# Patient Record
Sex: Male | Born: 1935
Health system: Southern US, Community
[De-identification: ages and names within clinical notes are randomized; demographics above are authoritative.]

## PROBLEM LIST (undated history)

## (undated) ENCOUNTER — Emergency Department (HOSPITAL_COMMUNITY): Admission: EM | Payer: Medicare Other | Source: Home / Self Care

## (undated) DIAGNOSIS — I4891 Unspecified atrial fibrillation: Secondary | ICD-10-CM

## (undated) DIAGNOSIS — I2510 Atherosclerotic heart disease of native coronary artery without angina pectoris: Secondary | ICD-10-CM

## (undated) DIAGNOSIS — K589 Irritable bowel syndrome without diarrhea: Secondary | ICD-10-CM

## (undated) DIAGNOSIS — I10 Essential (primary) hypertension: Secondary | ICD-10-CM

## (undated) DIAGNOSIS — R233 Spontaneous ecchymoses: Secondary | ICD-10-CM

## (undated) DIAGNOSIS — E119 Type 2 diabetes mellitus without complications: Secondary | ICD-10-CM

## (undated) DIAGNOSIS — M109 Gout, unspecified: Secondary | ICD-10-CM

## (undated) DIAGNOSIS — I498 Other specified cardiac arrhythmias: Secondary | ICD-10-CM

## (undated) DIAGNOSIS — K5790 Diverticulosis of intestine, part unspecified, without perforation or abscess without bleeding: Secondary | ICD-10-CM

## (undated) DIAGNOSIS — E785 Hyperlipidemia, unspecified: Secondary | ICD-10-CM

## (undated) DIAGNOSIS — I739 Peripheral vascular disease, unspecified: Secondary | ICD-10-CM

## (undated) DIAGNOSIS — H3530 Unspecified macular degeneration: Secondary | ICD-10-CM

## (undated) DIAGNOSIS — I499 Cardiac arrhythmia, unspecified: Secondary | ICD-10-CM

## (undated) DIAGNOSIS — D509 Iron deficiency anemia, unspecified: Secondary | ICD-10-CM

## (undated) DIAGNOSIS — H269 Unspecified cataract: Secondary | ICD-10-CM

## (undated) DIAGNOSIS — N183 Chronic kidney disease, stage 3 (moderate): Secondary | ICD-10-CM

## (undated) DIAGNOSIS — R001 Bradycardia, unspecified: Secondary | ICD-10-CM

## (undated) DIAGNOSIS — K469 Unspecified abdominal hernia without obstruction or gangrene: Secondary | ICD-10-CM

## (undated) DIAGNOSIS — C4491 Basal cell carcinoma of skin, unspecified: Secondary | ICD-10-CM

## (undated) DIAGNOSIS — R238 Other skin changes: Secondary | ICD-10-CM

## (undated) DIAGNOSIS — K52839 Microscopic colitis, unspecified: Secondary | ICD-10-CM

## (undated) DIAGNOSIS — Z8489 Family history of other specified conditions: Secondary | ICD-10-CM

## (undated) DIAGNOSIS — D631 Anemia in chronic kidney disease: Secondary | ICD-10-CM

## (undated) HISTORY — DX: Irritable bowel syndrome, unspecified: K58.9

## (undated) HISTORY — DX: Anemia in chronic kidney disease: D63.1

## (undated) HISTORY — DX: Basal cell carcinoma of skin, unspecified: C44.91

## (undated) HISTORY — DX: Unspecified macular degeneration: H35.30

## (undated) HISTORY — DX: Gout, unspecified: M10.9

## (undated) HISTORY — DX: Iron deficiency anemia, unspecified: D50.9

## (undated) HISTORY — DX: Diverticulosis of intestine, part unspecified, without perforation or abscess without bleeding: K57.90

## (undated) HISTORY — DX: Chronic kidney disease, stage 3 (moderate): N18.3

## (undated) HISTORY — DX: Essential (primary) hypertension: I10

## (undated) HISTORY — DX: Hyperlipidemia, unspecified: E78.5

## (undated) HISTORY — DX: Microscopic colitis, unspecified: K52.839

## (undated) HISTORY — DX: Atherosclerotic heart disease of native coronary artery without angina pectoris: I25.10

## (undated) HISTORY — PX: EYE SURGERY: SHX253

## (undated) HISTORY — DX: Other specified cardiac arrhythmias: I49.8

## (undated) HISTORY — DX: Peripheral vascular disease, unspecified: I73.9

## (undated) HISTORY — DX: Unspecified cataract: H26.9

## (undated) HISTORY — PX: CAROTID ENDARTERECTOMY: SUR193

## (undated) HISTORY — PX: SKIN CANCER EXCISION: SHX779

## (undated) HISTORY — DX: Cardiac arrhythmia, unspecified: I49.9

---
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
## 1997-04-28 HISTORY — PX: CORONARY ARTERY BYPASS GRAFT: SHX141

## 2000-04-23 ENCOUNTER — Encounter: Payer: Self-pay | Admitting: Emergency Medicine

## 2000-04-24 ENCOUNTER — Inpatient Hospital Stay (HOSPITAL_COMMUNITY): Admission: EM | Admit: 2000-04-24 | Discharge: 2000-04-25 | Payer: Self-pay | Admitting: Emergency Medicine

## 2000-04-25 ENCOUNTER — Encounter: Payer: Self-pay | Admitting: Cardiovascular Disease

## 2001-06-22 ENCOUNTER — Ambulatory Visit (HOSPITAL_COMMUNITY): Admission: RE | Admit: 2001-06-22 | Discharge: 2001-06-22 | Payer: Self-pay | Admitting: Gastroenterology

## 2003-12-01 ENCOUNTER — Emergency Department (HOSPITAL_COMMUNITY): Admission: EM | Admit: 2003-12-01 | Discharge: 2003-12-02 | Payer: Self-pay | Admitting: Emergency Medicine

## 2004-04-28 HISTORY — PX: CARDIAC CATHETERIZATION: SHX172

## 2004-12-24 ENCOUNTER — Ambulatory Visit: Payer: Self-pay | Admitting: Cardiovascular Disease

## 2004-12-26 ENCOUNTER — Inpatient Hospital Stay (HOSPITAL_COMMUNITY): Admission: EM | Admit: 2004-12-26 | Discharge: 2004-12-28 | Payer: Self-pay | Admitting: Emergency Medicine

## 2004-12-27 ENCOUNTER — Encounter: Payer: Self-pay | Admitting: Cardiology

## 2004-12-29 ENCOUNTER — Ambulatory Visit: Payer: Self-pay | Admitting: Cardiology

## 2005-01-03 ENCOUNTER — Ambulatory Visit: Payer: Self-pay | Admitting: Internal Medicine

## 2005-01-10 ENCOUNTER — Ambulatory Visit: Payer: Self-pay | Admitting: Cardiovascular Disease

## 2005-01-15 ENCOUNTER — Ambulatory Visit: Payer: Self-pay

## 2005-03-17 HISTORY — PX: COLONOSCOPY: SHX174

## 2005-03-17 HISTORY — PX: ESOPHAGOGASTRODUODENOSCOPY: SHX1529

## 2005-07-08 ENCOUNTER — Ambulatory Visit: Payer: Self-pay | Admitting: Cardiovascular Disease

## 2006-01-15 ENCOUNTER — Ambulatory Visit: Payer: Self-pay

## 2006-07-10 ENCOUNTER — Ambulatory Visit: Payer: Self-pay

## 2006-07-10 ENCOUNTER — Ambulatory Visit: Payer: Self-pay | Admitting: Cardiovascular Disease

## 2007-01-08 ENCOUNTER — Ambulatory Visit: Payer: Self-pay

## 2007-07-02 ENCOUNTER — Ambulatory Visit: Payer: Self-pay | Admitting: Cardiovascular Disease

## 2007-07-02 ENCOUNTER — Ambulatory Visit: Payer: Self-pay

## 2007-12-30 ENCOUNTER — Ambulatory Visit: Payer: Self-pay

## 2007-12-30 ENCOUNTER — Ambulatory Visit: Payer: Self-pay | Admitting: Cardiovascular Disease

## 2008-01-04 ENCOUNTER — Ambulatory Visit: Payer: Self-pay | Admitting: Vascular Surgery

## 2008-01-13 ENCOUNTER — Encounter: Payer: Self-pay | Admitting: Vascular Surgery

## 2008-01-13 ENCOUNTER — Inpatient Hospital Stay (HOSPITAL_COMMUNITY): Admission: RE | Admit: 2008-01-13 | Discharge: 2008-01-14 | Payer: Self-pay | Admitting: Vascular Surgery

## 2008-01-13 ENCOUNTER — Ambulatory Visit: Payer: Self-pay | Admitting: Vascular Surgery

## 2008-02-01 ENCOUNTER — Ambulatory Visit: Payer: Self-pay | Admitting: Vascular Surgery

## 2008-03-24 ENCOUNTER — Ambulatory Visit: Payer: Self-pay

## 2008-07-06 ENCOUNTER — Ambulatory Visit: Payer: Self-pay | Admitting: Cardiovascular Disease

## 2008-07-06 ENCOUNTER — Ambulatory Visit: Payer: Self-pay

## 2008-08-02 DIAGNOSIS — E7800 Pure hypercholesterolemia, unspecified: Secondary | ICD-10-CM | POA: Insufficient documentation

## 2008-08-02 DIAGNOSIS — I739 Peripheral vascular disease, unspecified: Secondary | ICD-10-CM | POA: Insufficient documentation

## 2008-08-02 DIAGNOSIS — R7303 Prediabetes: Secondary | ICD-10-CM | POA: Insufficient documentation

## 2008-08-02 DIAGNOSIS — I10 Essential (primary) hypertension: Secondary | ICD-10-CM | POA: Insufficient documentation

## 2008-08-02 DIAGNOSIS — I6529 Occlusion and stenosis of unspecified carotid artery: Secondary | ICD-10-CM | POA: Insufficient documentation

## 2008-08-02 DIAGNOSIS — I498 Other specified cardiac arrhythmias: Secondary | ICD-10-CM | POA: Insufficient documentation

## 2008-08-02 DIAGNOSIS — Z8719 Personal history of other diseases of the digestive system: Secondary | ICD-10-CM | POA: Insufficient documentation

## 2008-08-02 DIAGNOSIS — I25810 Atherosclerosis of coronary artery bypass graft(s) without angina pectoris: Secondary | ICD-10-CM | POA: Insufficient documentation

## 2008-08-03 ENCOUNTER — Encounter: Payer: Self-pay | Admitting: Cardiovascular Disease

## 2008-08-03 ENCOUNTER — Ambulatory Visit: Payer: Self-pay | Admitting: Cardiovascular Disease

## 2008-08-03 DIAGNOSIS — I70219 Atherosclerosis of native arteries of extremities with intermittent claudication, unspecified extremity: Secondary | ICD-10-CM | POA: Insufficient documentation

## 2008-10-21 ENCOUNTER — Ambulatory Visit: Payer: Self-pay | Admitting: Diagnostic Radiology

## 2008-10-21 ENCOUNTER — Emergency Department (HOSPITAL_BASED_OUTPATIENT_CLINIC_OR_DEPARTMENT_OTHER): Admission: EM | Admit: 2008-10-21 | Discharge: 2008-10-22 | Payer: Self-pay | Admitting: Emergency Medicine

## 2008-10-23 ENCOUNTER — Telehealth: Payer: Self-pay | Admitting: Cardiovascular Disease

## 2008-10-24 ENCOUNTER — Ambulatory Visit: Payer: Self-pay | Admitting: Cardiology

## 2008-10-24 ENCOUNTER — Inpatient Hospital Stay (HOSPITAL_COMMUNITY): Admission: EM | Admit: 2008-10-24 | Discharge: 2008-10-27 | Payer: Self-pay | Admitting: Emergency Medicine

## 2008-11-29 ENCOUNTER — Telehealth: Payer: Self-pay | Admitting: Cardiovascular Disease

## 2009-01-09 ENCOUNTER — Ambulatory Visit: Payer: Self-pay | Admitting: Cardiovascular Disease

## 2009-02-01 ENCOUNTER — Encounter (INDEPENDENT_AMBULATORY_CARE_PROVIDER_SITE_OTHER): Payer: Self-pay | Admitting: *Deleted

## 2009-06-26 ENCOUNTER — Ambulatory Visit: Payer: Self-pay

## 2009-06-26 ENCOUNTER — Ambulatory Visit: Payer: Self-pay | Admitting: Cardiovascular Disease

## 2009-07-02 ENCOUNTER — Ambulatory Visit: Payer: Self-pay

## 2009-07-02 ENCOUNTER — Ambulatory Visit: Payer: Self-pay | Admitting: Cardiovascular Disease

## 2010-05-28 NOTE — Assessment & Plan Note (Signed)
Summary: F6M/NEEDS CAROTID SAMEDAY/DM   Referring Provider:  Dr Johnsie Cancel   History of Present Illness: [name] is seen today post hospital D/C He had recurrent SSCP with history of CABG.  All of his grafts were patent and his pain has not recurred.  His primary has added norvasc for BP control and stopped his HCTZ/  I reviewed his home readings and they have been ok.   He needs to be on HCTZ though or he gets swelling in his ankles His leg has healed well.  He is walking without symptoms He sees Dr Burt Knack for PVD with mild decrease in his ABI"s on the right which should not be significant.  I reviewed his carotids today and they were normal by duplex.    Current Problems (verified): 1)  Atherosclerosis W /int Claudication  (ICD-440.21) 2)  Diabetes Mellitus, Borderline  (ICD-790.29) 3)  Bigeminy  (ICD-427.89) 4)  Diverticulitis, Hx of  (ICD-V12.79) 5)  Hypercholesterolemia Iia  (ICD-272.0) 6)  Carotid Artery Disease  (ICD-433.10) 7)  Pvd  (ICD-443.9) 8)  Hypertension  (ICD-401.9) 9)  Cad, Artery Bypass Graft  (ICD-414.04)  Current Medications (verified): 1)  Carvedilol 12.5 Mg Tabs (Carvedilol) .... Take One Tablet By Mouth Twice A Day 2)  Amlodipine Besylate 5 Mg Tabs (Amlodipine Besylate) .Marland Kitchen.. 1 Tab By Mouth Once A Day 3)  Lisinopril-Hydrochlorothiazide 20-12.5 Mg Tabs (Lisinopril-Hydrochlorothiazide) .... Take 1 Tablet By Mouth Once A Day 4)  Lipitor 20 Mg Tabs (Atorvastatin Calcium) .... Take One Tablet By Mouth Daily. 5)  Aspirin 81 Mg Tbec (Aspirin) .... Take One Tablet By Mouth Daily 6)  Preservision Areds  Caps (Multiple Vitamins-Minerals) .... Take 1 Capsule By Mouth Two Times A Day 7)  Zinc 50 Mg Tabs (Zinc) .... Take 1 Tablet By Mouth Once A Day 8)  Fish Oil   Oil (Fish Oil) .... 3 Capsules Daily 9)  Cinnamon 500 Mg Caps (Cinnamon) .... Take 1.000mg  2 Capsules Two Times A Day 10)  Chromium Gtf 200 Mcg Tabs (Chromium) .... 300mg  Tablet Two Times A Day 11)  Nitroglycerin 0.4 Mg  Subl (Nitroglycerin) .... Place 1 Tablet Under Tongue As Directed 12)  Multivitamins  Tabs (Multiple Vitamin) .... Take 1 Tablet By Mouth Once A Day 13)  Sugar Formula .Marland Kitchen.. 2-3- Times A Day  Allergies (verified): 1)  ! Penicillin  Past History:  Past Medical History: Last updated: 08/02/2008 CAD post bypass Hypertension PVD Cerebrovascular Disease Hyperlipidemia Diverticulitis Borderline Diabetes Bigeminal rhythm  Past Surgical History: Last updated: 08/02/2008 CABG- history of multivessel coronary artery disease, status  post coronary artery bypass grafting by Dr. Roxan Hockey in 1999, including a  LIMA to the left anterior descending artery, saphenous vein graft to the  diagonal, saphenous vein graft to the ramus intermedius, first and second  obtuse marginal branches, and saphenous vein graft to the posterior  descending and posterolateral branches of the right coronary artery with  preserved ejection fraction.   Left carotid endarterectomy with  Dacron patch angioplasty  Family History: Last updated: 08/03/2008  No history of peripheral arterial disease, DVT, or aneurysm. Strong family history of CAD.  Social History: Last updated: 08/02/2008   Patient is married with one son.    There is no significant ongoing tobacco or alcohol use history  Review of Systems       Denies fever, malais, weight loss, blurry vision, decreased visual acuity, cough, sputum, SOB, hemoptysis, pleuritic pain, palpitaitons, heartburn, abdominal pain, melena, lower extremity edema, claudication, or rash. All other systems  reviewed and negative  Vital Signs:  Patient profile:   77 year old male Height:      68 inches Weight:      175 pounds BMI:     26.70 Pulse rate:   67 / minute Resp:     14 per minute BP sitting:   146 / 69  (left arm)  Vitals Entered By: Burnett Kanaris (July 02, 2009 10:00 AM)  Physical Exam  General:  Affect appropriate Healthy:  appears stated  age 25: normal Neck supple with no adenopathy JVP normal Right  bruits no thyromegaly Lungs clear with no wheezing and good diaphragmatic motion Heart:  S1/S2 no murmur,rub, gallop or click PMI normal Abdomen: benighn, BS positve, no tenderness, no AAA no bruit.  No HSM or HJR Distal pulses intact with no bruits No edema Neuro non-focal Skin warm and dry S/P bilateral CEA   Impression & Recommendations:  Problem # 1:  ATHEROSCLEROSIS W /INT CLAUDICATION (ICD-440.21) Mild disease on right pain primarily muscular.  No critical PVD  Problem # 2:  HYPERCHOLESTEROLEMIA  IIA (ICD-272.0) Continue statin labs per primary His updated medication list for this problem includes:    Lipitor 20 Mg Tabs (Atorvastatin calcium) .Marland Kitchen... Take one tablet by mouth daily.  Problem # 3:  CAROTID ARTERY DISEASE (ICD-433.10) Patent bilateral CEA's F/U duplex in 2 years His updated medication list for this problem includes:    Aspirin 81 Mg Tbec (Aspirin) .Marland Kitchen... Take one tablet by mouth daily  Problem # 4:  CAD, ARTERY BYPASS GRAFT (ICD-414.04) Stable no angina patent grafts by cath  His updated medication list for this problem includes:    Carvedilol 12.5 Mg Tabs (Carvedilol) .Marland Kitchen... Take one tablet by mouth twice a day    Amlodipine Besylate 5 Mg Tabs (Amlodipine besylate) .Marland Kitchen... 1 tab by mouth once a day    Lisinopril-hydrochlorothiazide 20-12.5 Mg Tabs (Lisinopril-hydrochlorothiazide) .Marland Kitchen... Take 1 tablet by mouth once a day    Aspirin 81 Mg Tbec (Aspirin) .Marland Kitchen... Take one tablet by mouth daily    Nitroglycerin 0.4 Mg Subl (Nitroglycerin) .Marland Kitchen... Place 1 tablet under tongue as directed   EKG Report  Procedure date:  07/02/2009  Findings:      NSR PR 212 Otherwise normal

## 2010-05-28 NOTE — Miscellaneous (Signed)
Summary: Orders Update  Clinical Lists Changes  Orders: Added new Test order of Carotid Duplex (Carotid Duplex) - Signed 

## 2010-05-28 NOTE — Assessment & Plan Note (Signed)
Summary: ROV    Visit Type:  Follow-up Referring Provider:  Dr Johnsie Cancel  CC:  Constant left leg calf pain.  History of Present Illness: 77 year-old male with lower extremity PAD and probable left SFA occlusion who has been managed medically because of mild symptoms. He has had stable claudication for greater than one year with walking over .5 miles, but about one month ago felt like he 'pulled a muscle' in his left calf with walking and has had near constant left calf pain since that time. Difficult to walk secondary to pain. Denies pain in the foot. Denies ulceration, numness, or tingling. No right leg symptoms.  Current Medications (verified): 1)  Carvedilol 12.5 Mg Tabs (Carvedilol) .... Take One Tablet By Mouth Twice A Day 2)  Amlodipine Besylate 5 Mg Tabs (Amlodipine Besylate) .Marland Kitchen.. 1 Tab By Mouth Once A Day 3)  Lisinopril-Hydrochlorothiazide 20-12.5 Mg Tabs (Lisinopril-Hydrochlorothiazide) .... Take 1 Tablet By Mouth Once A Day 4)  Lipitor 20 Mg Tabs (Atorvastatin Calcium) .... Take One Tablet By Mouth Daily. 5)  Aspirin 81 Mg Tbec (Aspirin) .... Take One Tablet By Mouth Daily 6)  Preservision Areds  Caps (Multiple Vitamins-Minerals) .... Take 1 Capsule By Mouth Two Times A Day 7)  Zinc 50 Mg Tabs (Zinc) .... Take 1 Tablet By Mouth Once A Day 8)  Fish Oil   Oil (Fish Oil) .... 3 Capsules Daily 9)  Cinnamon 500 Mg Caps (Cinnamon) .... Take 1.000mg  2 Capsules Two Times A Day 10)  Chromium Gtf 200 Mcg Tabs (Chromium) .... 300mg  Tablet Two Times A Day 11)  Nitroglycerin 0.4 Mg Subl (Nitroglycerin) .... Place 1 Tablet Under Tongue As Directed 12)  Multivitamins  Tabs (Multiple Vitamin) .... Take 1 Tablet By Mouth Once A Day 13)  Sugar Formula .Marland Kitchen.. 2-3- Times A Day  Allergies: 1)  ! Penicillin  Past History:  Past medical history reviewed for relevance to current acute and chronic problems.  Past Medical History: CAD post bypass Hypertension PVD with right ABI 1.1 and left ABI 0.8,  intermittent claudication. Cerebrovascular Disease Hyperlipidemia Diverticulitis Borderline Diabetes Bigeminal rhythm  Review of Systems       Negative except as per HPI   Vital Signs:  Patient profile:   77 year old male Height:      68 inches Weight:      174.75 pounds BMI:     26.67 Pulse rate:   73 / minute Pulse rhythm:   regular Resp:     18 per minute BP sitting:   146 / 73  (left arm) Cuff size:   large  Vitals Entered By: Sidney Ace (June 26, 2009 2:34 PM)  Serial Vital Signs/Assessments:  Time      Position  BP       Pulse  Resp  Temp     By           R Arm     138/79                         Sidney Ace   Physical Exam  General:  Pt is alert and oriented, in no acute distress. HEENT: normal Neck: normal carotid upstrokes without bruits, JVP normal Lungs: CTA CV: RRR without murmur or gallop Abd: soft, NT, positive BS, no bruit, no organomegaly Ext: no clubbing, cyanosis, or edema. peripheral pulses 2+ on the right, left femoral 2+, DP and PT 1+ Skin: warm and dry without rash  ABI's  Procedure date:  06/26/2009  Findings:      Right: 1.1, Left: 0.81  Impression & Recommendations:  Problem # 1:  ATHEROSCLEROSIS W /INT CLAUDICATION (ICD-440.21) I suspect his leg pain is secondary to a muscle strain as his history suggests this. There is no exam evidence of progressive occlusive disease or resting ischemia. ABI's were done to assess for any change from baseline and they were stable with mild disease on the left at 0.8. Reviewed stretching exercises with pt. Continue current medical therapy.  Orders: Arterial Duplex Lower Extremity (Arterial Duplex Low)  Patient Instructions: 1)  Your physician wants you to follow-up in:   1 YEAR. You will receive a reminder letter in the mail two months in advance. If you don't receive a letter, please call our office to schedule the follow-up appointment. 2)  Your physician has requested that you have an  ankle brachial index (ABI) today. During this test an ultrasound and blood pressure cuff are used to evaluate the arteries that supply the arms and legs with blood. Allow thirty minutes for this exam. There are no restrictions or special instructions. 3)  Your physician recommends that you continue on your current medications as directed. Please refer to the Current Medication list given to you today.

## 2010-06-28 ENCOUNTER — Encounter: Payer: Self-pay | Admitting: Cardiovascular Disease

## 2010-06-28 ENCOUNTER — Ambulatory Visit (INDEPENDENT_AMBULATORY_CARE_PROVIDER_SITE_OTHER): Payer: Medicare Other | Admitting: Cardiovascular Disease

## 2010-06-28 DIAGNOSIS — I70219 Atherosclerosis of native arteries of extremities with intermittent claudication, unspecified extremity: Secondary | ICD-10-CM

## 2010-07-04 NOTE — Assessment & Plan Note (Signed)
Summary: F1Y   Visit Type:  1 year follow up Referring Provider:  Dr Johnsie Cancel Primary Provider:  Dr Maceo Pro  CC:  No complaints.  History of Present Illness: 77 year-old presents for followup of lower extremity PAD. He has also undergone carotid endarterectomy with widely patent carotids at his last duplex scan one year ago.   He is doing well. Reports stable, mild left calf claudication at 1/2 mile of walking. This resolves immediately with rest. No rest pain, ulceration, chest pain, dyspnea, or other complaints.  Current Medications (verified): 1)  Carvedilol 12.5 Mg Tabs (Carvedilol) .... Take One Tablet By Mouth Twice A Day 2)  Amlodipine Besylate 5 Mg Tabs (Amlodipine Besylate) .... 2 Tablets Once A Day 3)  Lisinopril 20 Mg Tabs (Lisinopril) .... Take One Tablet By Mouth Daily 4)  Livalo 4 Mg Tabs (Pitavastatin Calcium) .... Take 1 Tablet By Mouth Once A Day 5)  Aspirin 81 Mg Tbec (Aspirin) .... Take One Tablet By Mouth Daily 6)  Preservision Areds  Caps (Multiple Vitamins-Minerals) .... Take 1 Capsule By Mouth Two Times A Day 7)  Zinc 50 Mg Tabs (Zinc) .... Take 1 Tablet By Mouth Once A Day 8)  Fish Oil   Oil (Fish Oil) .... Take 1 Capsule By Mouth Two Times A Day 9)  Cinnamon 500 Mg Caps (Cinnamon) .... Take 1.000mg  2 Capsules Two Times A Day 10)  Nitroglycerin 0.4 Mg Subl (Nitroglycerin) .... Place 1 Tablet Under Tongue As Directed 11)  Multivitamins  Tabs (Multiple Vitamin) .... Take 1 Tablet By Mouth Once A Day 12)  Sugar Formula .Marland Kitchen.. 2-3- Times A Day 13)  Prednisone 2.5 Mg Tabs (Prednisone) .... Every Other Day  Allergies: 1)  ! Penicillin  Past History:  Past Medical History: Last updated: 06/26/2009 CAD post bypass Hypertension PVD with right ABI 1.1 and left ABI 0.8, intermittent claudication. Cerebrovascular Disease Hyperlipidemia Diverticulitis Borderline Diabetes Bigeminal rhythm  Vital Signs:  Patient profile:   77 year old male Height:      68  inches Weight:      169.50 pounds BMI:     25.87 Pulse rate:   60 / minute Pulse rhythm:   regular Resp:     18 per minute BP sitting:   118 / 54  (left arm) Cuff size:   large  Vitals Entered By: Sidney Ace (June 28, 2010 9:51 AM)  Serial Vital Signs/Assessments:  Time      Position  BP       Pulse  Resp  Temp     By           R Arm     118/54                         Sidney Ace   Physical Exam  General:  Pt is alert and oriented, in no acute distress. HEENT: normal Neck: normal carotid upstrokes without bruits, JVP normal Lungs: CTA CV: RRR without murmur or gallop Abd: soft, NT, positive BS, no bruit, no organomegaly Ext:fem pulses 2+, right DP and PT 2+, left DP 1+, PT nonpalp. Both feet warm. Skin: warm and dry without rash    Impression & Recommendations:  Problem # 1:  ATHEROSCLEROSIS W /INT CLAUDICATION (ICD-440.21) Pt has stable, mild intermittent claudication of the left calf. He has total occlusion of the left SFA with collaterals. Recommend ongoing medical therapy and continued walking program. He is on ASA for antiplatelet Rx  and BP is well-controlled. I advised him that Dr Johnsie Cancel could follow his vascular disease and I would be happy to see him in the future if symptoms progress.  Problem # 2:  CAROTID ARTERY DISEASE (ICD-433.10) Stable after endarterectomy. No neurologic symptoms. He will be due for followup duplex scanning in one year.  His updated medication list for this problem includes:    Aspirin 81 Mg Tbec (Aspirin) .Marland Kitchen... Take one tablet by mouth daily  Patient Instructions: 1)  Your physician recommends that you schedule a follow-up appointment as needed with Dr Burt Knack. Please continue routine follow-up with Dr Johnsie Cancel.  2)  Your physician recommends that you continue on your current medications as directed. Please refer to the Current Medication list given to you today.

## 2010-07-06 ENCOUNTER — Encounter: Payer: Self-pay | Admitting: Cardiology

## 2010-07-19 ENCOUNTER — Encounter: Payer: Self-pay | Admitting: Cardiovascular Disease

## 2010-07-19 ENCOUNTER — Ambulatory Visit (INDEPENDENT_AMBULATORY_CARE_PROVIDER_SITE_OTHER): Payer: Medicare Other | Admitting: Cardiovascular Disease

## 2010-07-19 VITALS — BP 138/58 | HR 64 | Ht 68.0 in | Wt 169.0 lb

## 2010-07-19 DIAGNOSIS — I6529 Occlusion and stenosis of unspecified carotid artery: Secondary | ICD-10-CM

## 2010-07-19 DIAGNOSIS — I10 Essential (primary) hypertension: Secondary | ICD-10-CM

## 2010-07-19 DIAGNOSIS — I2510 Atherosclerotic heart disease of native coronary artery without angina pectoris: Secondary | ICD-10-CM

## 2010-07-19 DIAGNOSIS — E7800 Pure hypercholesterolemia, unspecified: Secondary | ICD-10-CM

## 2010-07-19 DIAGNOSIS — I739 Peripheral vascular disease, unspecified: Secondary | ICD-10-CM

## 2010-07-19 NOTE — Assessment & Plan Note (Signed)
Well controlled.  Continue current medications and low sodium Dash type diet.    

## 2010-07-19 NOTE — Assessment & Plan Note (Signed)
F/U duplex in a year.  No stenosis duplex 3/10

## 2010-07-19 NOTE — Patient Instructions (Signed)
Your physician recommends that you schedule a follow-up appointment in: 6 months  

## 2010-07-19 NOTE — Progress Notes (Signed)
Doss is seen for F/U of CAD.  3/11 had SSCP and required cath.   All of his grafts were patent and his pain has not recurred.    CABG:  SVG: IM,OM1,OM2, SVG D1, SVG PDA/PLA and Lima to LAD  EF was 45-50% with mild diffuse hypokinesis.  BP is better controlled.  Previous edema also improved with diuretic which has now been stopped  He sees Dr Burt Knack for PVD.  ABI 3/11 .88 on right.  Carotid duplex with no significant stenosis S/P right CEA.  Has mild claudication in left calf that he walks through.  Can have F/U duplex and ABI's when he sees Dr Burt Knack 3/13.    Denies SSCP, palpitaitons, TIA symptoms  ROS: Denies fever, malais, weight loss, blurry vision, decreased visual acuity, cough, sputum, SOB, hemoptysis, pleuritic pain, palpitaitons, heartburn, abdominal pain, melena, lower extremity edema, claudication, or rash.   General:  Affect appropriate Healthy:  appears stated age 77: normal Neck supple with no adenopathy JVP normal rigth  bruits no thyromegaly Lungs clear with no wheezing and good diaphragmatic motion Heart:  S1/S2 no murmur,rub, gallop or click PMI normal Abdomen: benighn, BS positve, no tenderness, no AAA no bruit.  No HSM or HJR Distal pulses intact with no bruits No edema Neuro non-focal Skin warm and dry No muscular weakness   Current Outpatient Prescriptions  Medication Sig Dispense Refill  . amLODipine (NORVASC) 5 MG tablet Take 10 mg by mouth daily.        Marland Kitchen aspirin 81 MG tablet Take 81 mg by mouth daily.        . carvedilol (COREG) 12.5 MG tablet Take 12.5 mg by mouth 2 (two) times daily with a meal.        . Cinnamon 500 MG TABS Take by mouth as directed.        . fish oil-omega-3 fatty acids 1000 MG capsule Take 2 g by mouth 2 (two) times daily.        Marland Kitchen lisinopril (PRINIVIL,ZESTRIL) 20 MG tablet Take 20 mg by mouth daily.        . Multiple Vitamins-Minerals (PRESERVISION AREDS PO) Take by mouth 2 (two) times daily.        . multivitamin (THERAGRAN)  per tablet Take 1 tablet by mouth daily.        . nitroGLYCERIN (NITROSTAT) 0.4 MG SL tablet Place 0.4 mg under the tongue every 5 (five) minutes as needed.        . Pitavastatin Calcium (LIVALO) 4 MG TABS Take by mouth daily.        . predniSONE (DELTASONE) 2.5 MG tablet Take 2.5 mg by mouth every other day.        . zinc gluconate 50 MG tablet Take 50 mg by mouth daily.        Marland Kitchen DISCONTD: Throat Lozenges (ZINC + VITAMIN C MT) Use as directed in the mouth or throat.        Allergies  Penicillins  Assessment and Plan

## 2010-07-19 NOTE — Assessment & Plan Note (Signed)
At goal with no side effects  Labs per primary

## 2010-07-19 NOTE — Assessment & Plan Note (Signed)
Stable no angina Grafts patent by cath 3/11  Continue medical Rx

## 2010-07-19 NOTE — Assessment & Plan Note (Signed)
Mild claudication left calf.  ABI .88 F/U Dr Burt Knack in a year

## 2010-07-26 ENCOUNTER — Encounter: Payer: Self-pay | Admitting: Cardiovascular Disease

## 2010-08-04 LAB — BASIC METABOLIC PANEL
BUN: 23 mg/dL (ref 6–23)
CO2: 23 mEq/L (ref 19–32)
Calcium: 7.9 mg/dL — ABNORMAL LOW (ref 8.4–10.5)
Creatinine, Ser: 1.14 mg/dL (ref 0.4–1.5)
GFR calc Af Amer: >60 mL/min (ref >60)

## 2010-08-04 LAB — APTT: aPTT: 27 seconds (ref 24–37)

## 2010-08-04 LAB — CBC
MCHC: 34.8 g/dL (ref 30.0–36.0)
Platelets: 200 10*3/uL (ref 150–400)
RBC: 3.94 MIL/uL — ABNORMAL LOW (ref 4.22–5.81)

## 2010-08-04 LAB — GLUCOSE, CAPILLARY
Glucose-Capillary: 94 mg/dL (ref 70–99)
Glucose-Capillary: 98 mg/dL (ref 70–99)

## 2010-08-05 LAB — POCT I-STAT, CHEM 8
Calcium, Ion: 1.12 mmol/L (ref 1.12–1.32)
Hemoglobin: 14.3 g/dL (ref 13.0–17.0)
Sodium: 138 mEq/L (ref 135–145)
TCO2: 21 mmol/L (ref 0–100)

## 2010-08-05 LAB — BASIC METABOLIC PANEL
BUN: 31 mg/dL — ABNORMAL HIGH (ref 6–23)
BUN: 32 mg/dL — ABNORMAL HIGH (ref 6–23)
Chloride: 109 mEq/L (ref 96–112)
Chloride: 107 mEq/L (ref 96–112)
Creatinine, Ser: 1.27 mg/dL (ref 0.4–1.5)
Creatinine, Ser: 1.4 mg/dL (ref 0.4–1.5)
GFR calc Af Amer: >60 mL/min (ref >60)
GFR calc non Af Amer: 55 mL/min — ABNORMAL LOW (ref >60)
GFR calc non Af Amer: 49 mL/min — ABNORMAL LOW (ref >60)
Potassium: 4.4 mEq/L (ref 3.5–5.1)

## 2010-08-05 LAB — CBC
Hemoglobin: 14.1 g/dL (ref 13.0–17.0)
MCHC: 34.5 g/dL (ref 30.0–36.0)
MCV: 90.2 fL (ref 78.0–100.0)
Platelets: 242 10*3/uL (ref 150–400)
Platelets: 249 10*3/uL (ref 150–400)
RDW: 13.4 % (ref 11.5–15.5)
WBC: 7.1 10*3/uL (ref 4.0–10.5)

## 2010-08-05 LAB — CK TOTAL AND CKMB (NOT AT ARMC): CK, MB: 1.1 ng/mL (ref 0.3–4.0)

## 2010-08-05 LAB — CARDIAC PANEL(CRET KIN+CKTOT+MB+TROPI)
Relative Index: INVALID (ref 0.0–2.5)
Total CK: 39 U/L (ref 7–232)

## 2010-08-05 LAB — GLUCOSE, CAPILLARY
Glucose-Capillary: 107 mg/dL — ABNORMAL HIGH (ref 70–99)
Glucose-Capillary: 121 mg/dL — ABNORMAL HIGH (ref 70–99)
Glucose-Capillary: 123 mg/dL — ABNORMAL HIGH (ref 70–99)

## 2010-08-05 LAB — POCT CARDIAC MARKERS
CKMB, poc: <1 ng/mL — ABNORMAL LOW (ref 1.0–8.0)
Myoglobin, poc: 83 ng/mL (ref 12–200)
Troponin i, poc: <0.05 ng/mL (ref 0.00–0.09)
Troponin i, poc: <0.05 ng/mL (ref 0.00–0.09)
Troponin i, poc: <0.05 ng/mL (ref 0.00–0.09)

## 2010-08-05 LAB — DIFFERENTIAL
Basophils Absolute: 0 10*3/uL (ref 0.0–0.1)
Basophils Relative: 0 % (ref 0–1)
Lymphocytes Relative: 2 % — ABNORMAL LOW (ref 12–46)
Neutro Abs: 9.9 10*3/uL — ABNORMAL HIGH (ref 1.7–7.7)
Neutrophils Relative %: 92 % — ABNORMAL HIGH (ref 43–77)

## 2010-08-05 LAB — URINALYSIS, ROUTINE W REFLEX MICROSCOPIC
Bilirubin Urine: NEGATIVE
Hgb urine dipstick: NEGATIVE
Ketones, ur: NEGATIVE mg/dL
Nitrite: NEGATIVE
pH: 5.5 (ref 5.0–8.0)

## 2010-08-05 LAB — LIPASE, BLOOD: Lipase: 27 U/L (ref 11–59)

## 2010-08-05 LAB — HEMOGLOBIN A1C: Mean Plasma Glucose: 120 mg/dL

## 2010-08-05 LAB — LIPID PANEL
LDL Cholesterol: 51 mg/dL (ref 0–99)
Triglycerides: 102 mg/dL (ref <150)

## 2010-08-05 LAB — TROPONIN I: Troponin I: 0.01 ng/mL (ref 0.00–0.06)

## 2010-08-05 LAB — AMYLASE: Amylase: 111 U/L (ref 27–131)

## 2010-08-05 LAB — PROTIME-INR
INR: 1 (ref 0.00–1.49)
Prothrombin Time: 13.7 seconds (ref 11.6–15.2)

## 2010-08-05 LAB — HEPATIC FUNCTION PANEL
Indirect Bilirubin: 0.5 mg/dL (ref 0.3–0.9)
Total Protein: 6.5 g/dL (ref 6.0–8.3)

## 2010-08-05 LAB — APTT: aPTT: 29 seconds (ref 24–37)

## 2010-08-14 ENCOUNTER — Encounter: Payer: Self-pay | Admitting: Cardiology

## 2010-08-14 ENCOUNTER — Encounter: Payer: Self-pay | Admitting: Cardiovascular Disease

## 2010-09-10 NOTE — Assessment & Plan Note (Signed)
[place] OFFICE NOTE   EFREM, RYKEN                     MRN:          [id number]  DATE:07/06/2008                            DOB:          Mar 30, 1929    [name] returns today for followup.  He has a distant history of coronary  artery bypass surgery , I believe in 1999.  He had a cath in 2006 and  the proximal OM1 limb of the sequential graft was occluded, but the  remainder of his grafts including diagonal, RCA, distal OM, and LIMA  were patent.  He has low normal ejection fraction.  He has been doing  well.  He continues to golf.  He had 1 episode of what appear to be  indigestion after having spicy food and it lasted for couple of hours,  but had some improvement with Maalox, otherwise has not had any  significant chest pain, palpitations, PND, or orthopnea.  There had been  no lower extremity edema.   He has had some claudication in the left lower extremity.  He had a  peripheral study, which showed an ABI of 0.4.  He has a short segment  occlusion of the SFA with good collateralization.   In talking to Bracy, he has no resting pain.  He has no skin problems.  His claudication has been fairly stable, but he definitely gets cath  tightness with moderate exertion.  I told him since it is a short  segment occlusion, I would have him see Dr. Burt Knack, although I suspect  the only way to revascularize his lower extremity would be with a fem-  pop bypass.   Otherwise, his review of systems is negative.   MEDICATIONS:  1. Carvedilol 12.5 mg b.i.d.  2. Amlodipine 5 mg a day.  3. Chromium.  4. Lipitor 20 a day.  5. Lisinopril HCTZ 20/12.5.  6. Eye drops.   He is allergic to PENICILLIN.   PHYSICAL EXAMINATION:  GENERAL:  Remarkable for healthy-appearing male  in no distress.  VITAL SIGNS:  Blood pressure is 150/80, pulse 60 and regular,  respiratory rate 14, afebrile, and weight 171.  HEENT:   Unremarkable.  NECK:  Status post right CEA with some slight residual bruit.  No  lymphadenopathy, thyromegaly, or JVP elevation.  LUNGS:  Clear.  Good diaphragmatic motion.  No wheezing.  CARDIAC:  S1 and S2.  Normal heart sounds.  PMI normal.  ABDOMEN:  Benign.  Bowel sounds positive.  No AAA.  No bruit.  No  hepatosplenomegaly.  No hepatojugular reflux.  No tenderness.  EXTREMITIES:  He has decreased peripheral pulses on the left.  Right  side are normal.  His popliteal is +2 and his PT is +1-2 and he has no  obvious femoral bruits.   His ABIs were reviewed including Doppler waveform showing a short  segment occlusion of the left SFA.  His carotid duplex study from July 06, 2008 was reviewed 0-39% bilateral ICA stenosis, stable.   His electrocardiogram shows sinus rhythm with old inferior wall MI.   Reviewed also Myoview done  on July 02, 2007, which shows an EF of 65%  with inferior basal thinning, no ischemia.   IMPRESSION:  1. Coronary artery disease, previous coronary artery bypass graft,      proximal limb of the sequential obtuse marginal graft occluded by      cath in 2006.  One episode of indigestion likely gastrointestinal.      Continue aspirin and beta-blocker.  Consider follow up Myoview in a      year.  2. Hypertension, currently well controlled.  Continue current dose of      angiotensin-converting enzyme.  3. Peripheral vascular disease.  Ankle-brachial index 0.84 with mild      claudication on the left.  Short segment superficial femoral artery      occlusion.  Followup consultation with Dr. Burt Knack.  I believe his      coronary status is stable enough to consider Pletal, but I will      leave this up to Dr. Burt Knack.  4. Carotid disease with previous right carotid endarterectomy.  Follow      up duplex in a year,0-39% stenosis bilaterally.  The patient will      have a prescription for nitroglycerin.  Called into [facility] in      [place].   Otherwise, I think he is  doing well.  He is enjoying his lifestyle and  playing golf.  I will see him back in 6 months.  He will see Dr. Burt Knack  in the interim.     [name]. Johnsie Cancel, MD, [facility]  Electronically Signed    PCN/MedQ  DD: 07/06/2008  DT: 07/06/2008  Job #: [id number]

## 2010-09-10 NOTE — Assessment & Plan Note (Signed)
OFFICE VISIT   SCHAEFER, OSBURN  DOB:  07/31/1928                                       02/01/2008  [id number]   I saw the patient in the office today for followup after his recent left  carotid endarterectomy.  This is a pleasant 77 year old gentleman who  had presented with an asymptomatic greater than 80% left carotid  stenosis.  Left carotid endarterectomy was recommended in order to lower  his risk of future stroke.  He underwent left carotid endarterectomy  with Dacron patch angioplasty on 01/13/2008.  He did well  postoperatively and he was discharged on 01/14/2008.  He returns for his  first outpatient visit.   He has had no focal weakness or paresthesias.  He has been swallowing  without difficulty.  He has gradually resumed his normal activities.   PHYSICAL EXAMINATION:  Vital signs:  On physical examination blood  pressure is 154/71, heart rate is 82.  Neck:  His left neck incision has  healed nicely.  Lungs:  Are clear bilaterally to auscultation.  Neurologic:  Exam is nonfocal.   Overall I am pleased with his progress.  He had no significant carotid  stenosis on the right on his previous duplex scan.  He will continue his  carotid followup studies with Dr. Johnsie Cancel.  I would recommend a followup  study in 6 months and if this looks good he could probably simply have  yearly followup studies.  I would be happy to see him back at any time  in the future if he developed significant carotid stenosis or new  symptoms.  He does know to continue taking his aspirin.   Judeth Cornfield. Scot Dock, M.D.  Electronically Signed   CSD/MEDQ  D:  02/01/2008  T:  02/02/2008  Job:  [id number]   cc:   [name]. Johnsie Cancel, MD, [facility]

## 2010-09-10 NOTE — H&P (Signed)
[name], [name] NO.:  [id number]   MEDICAL RECORD NO.:  [id number]          PATIENT TYPE:  INP   LOCATION:  [id number]                         FACILITY:  [facility]   PHYSICIAN:  Carlena Bjornstad, MD, FACCDATE OF BIRTH:  [birth year]   DATE OF ADMISSION:  10/24/2008  DATE OF DISCHARGE:                              HISTORY & PHYSICAL   PRIMARY CARDIOLOGIST:  Collier Salina C. Johnsie Cancel, MD, [facility]   PRIMARY CARE PHYSICIAN:  [place] Maceo Pro, MD   VASCULAR SURGEON:  Judeth Cornfield. Scot Dock, MD   CHIEF COMPLAINT:  Epigastric pain.   HISTORY OF PRESENT ILLNESS:  [name] is a 77 year old male with  previous coronary artery disease.  He woke with epigastric pressure and  nausea at 3:30 a.m. today.  His symptoms continued and he came to the  emergency room when they did not resolve.  He arrived in the emergency  room at approximately 10:15, still complaining of chest pressure with  associated diaphoresis.  He had 3 episodes of emesis.  There was no  shortness of breath, dizziness, or presyncope.  He denies any other  abdominal symptoms except diarrhea which has been chronic for the last  several months.  In the emergency room, he received Zofran and aspirin.  Currently, he is symptom-free.  He went to the [facility] in [facility]  on October 21, 2008 for similar symptoms and a followup was scheduled with  Dr. Johnsie Cancel after that visit; however, he has not yet been seen.  Currently, he is resting comfortably.   PAST MEDICAL HISTORY:  1. Status post aortocoronary bypass surgery in 1998 with LIMA to LAD;      SVG to D1; SVG to ramus, OM-1, and OM-2; SVG to PDA and PL.  2. Status post Myoview in March 2009 showing no ischemia and an EF of      65%.  3. History of TIA.  4. Diet-controlled diabetes.  5. Hypertension.  6. Hyperlipidemia.  7. Macular degeneration.  8. Status post bilateral CEA.  9. Peripheral vascular disease with an ABI on the left of 0.4.   SURGICAL HISTORY:  He is  status post cardiac catheterizations as well as  bypass surgery, bilateral carotid endarterectomies.   ALLERGIES:  Penicillin.   CURRENT MEDICATIONS:  1. Coreg 12.5 mg b.i.d.  2. Norvasc 5 mg a day.  3. Lisinopril and hydrochlorothiazide 20/12.5 daily.  4. Lipitor 20 mg a day.  5. Aspirin 81 mg a day.  6. PreserVision b.i.d.  7. Zinc 50 mg a day.  8. Cod liver oil daily.  9. Multivitamin daily.  10.Cinnamon b.i.d.  11.Chromium daily.   SOCIAL HISTORY:  Lives in [place] with his wife and is retired from  [employer].  He has an approximately 20 pack-year history of tobacco  use, but quit 45 years ago.  He denies alcohol or drug abuse.  He tries  to stick to a diet with reduced salt and carbs and continues to play  golf twice a week.   FAMILY HISTORY:  His mother died at age 23 of a stroke and his father  died at age 6 with a history of heart failure and MI.  He has one  sister with no coronary artery disease.   REVIEW OF SYSTEMS:  Significant for weakness as well as the symptoms  described in the HPI.  He has had some other episodes of chest pain that  by description was more like indigestion.  Prior to his bypass surgery,  his chest pain was a pressure but it was substernal and not epigastric  as his symptoms are now.  Full 14-point review of systems is otherwise  negative.   PHYSICAL EXAMINATION:  VITAL SIGNS:  Temperature is 97.1, blood pressure  140/56, pulse 80, respiratory rate 19, and O2 saturation 97% on room  air.  GENERAL:  He is a well-developed, well-nourished 77 year old male in no  acute distress.  HEENT:  Essentially normal.  NECK:  There is no lymphadenopathy, thyromegaly, or JVD noted but he  does have a noticeable bruit, especially on the right.  CVA:  Heart is regular in rate and rhythm with an S1 and S2 and no  significant murmur, rub, or gallop is noted.  LUNGS:  Clear to auscultation bilaterally.  SKIN:  No rashes or lesions are noted.  ABDOMEN:   Soft and nontender with active bowel sounds.  EXTREMITIES:  There is no cyanosis, clubbing, or edema noted.  Distal  pulses are intact in all 4 extremities.  MUSCULOSKELETAL:  There is no joint deformity or effusions.  NEURO:  He is alert and oriented.  Cranial nerves II-XII grossly intact.   CHEST X-RAY:  No acute disease.   EKG:  Sinus rhythm, rate 77, with diffuse inferolateral ST changes of  unclear significance.   LABORATORY VALUES:  Hemoglobin 14.1, hematocrit 40.8, WBCs 10.7,  platelets 242, and INR 1.0.  Sodium 138, potassium 5.2, chloride 108,  BUN 36, creatinine 1.5, and glucose 171.  Point-of-care markers negative  x2.   IMPRESSION:  [name] was seen today by Dr. Ron Parker.  He has a history  of coronary artery disease and was seen at [facility] in [place] on  October 21, 2008 with chest pain.  Today, he had similar symptoms  associated with diaphoresis as well as nausea and vomiting.  Point-of-  care markers are negative so far, but his story is concerning.  His  chest pain is similar in quality to the pain prior to his bypass  surgery.  However, it is not as severe and in a slightly different  location.  He will be admitted to Telemetry.  He will be continued on  his medications with the exception of hydrochlorothiazide because of the  nausea and vomiting.  We will check LFTs as well as an amylase and  lipase.  Dr. Johnsie Cancel is to evaluate him in the morning regarding possible  cardiac catheterization.  We  will keep him on clear liquids for now.  We will check a hemoglobin A1c  and a lipid profile for cardiac risk factor assessment as well.  He will  be put on sliding scale insulin while he is in the hospital and given  information on a diabetic diet.      Rosaria Ferries, PA-C      Carlena Bjornstad, MD, [facility]  Electronically Signed    RB/MEDQ  D:  10/24/2008  T:  10/25/2008  Job:  [id number]   cc:   Herbie Baltimore L. Maceo Pro, M.D.

## 2010-09-10 NOTE — Assessment & Plan Note (Signed)
[facility] OFFICE NOTE   William, [name]                     MRN:          [id number]  DATE:12/30/2007                            DOB:          1928-06-03    [name] returns today for follow-up.  He has history of distant  coronary bypass surgery with CABG in 1999.  He is not having significant  chest pain, PND, orthopnea.   His last Myoview, July 02, 2007, was nonischemic with an EF of 65%.   He has known carotid disease.  He has had a previous right carotid  endarterectomy by Dr. Arlyce [name]   He had a follow-up duplex scan today, December 30, 2007.  He has had  significant progression of his left-sided disease.  His peak proximal  ICA velocity was 458 cm/sec with a diastolic velocity of 123456 cm/sec.   This gave him a maximal ICA stenosis in the 80-99% range.   He has not had any TIA-like symptoms.   In talking to the patient, he understands the need for follow-up CEA,  took the liberty of calling over to [facility] and get him appointment with  Dr. Gae [name] for next Tuesday.   Again I do not think he needs any further cardiac workup since he has  had a low-risk Myoview.  He is active playing golf and is not having  chest pain.   CURRENT MEDICATIONS:  1. Carvedilol 12.5 b.i.d.  2. Amlodipine 5 a day.  3. Cod liver oil, chromium, and fish oil.   PHYSICAL EXAMINATION:  GENERAL:  Remarkable for healthy-appearing  elderly white male in no distress.  VITAL SIGNS:  Blood pressure 110/52, pulse 64 and regular, respiratory  rate 14, afebrile.  Weight is 176.  NECK:  Bilateral carotid bruits.  No lymphadenopathy, thyromegaly, JVP  elevation.  LUNGS:  Clear with diaphragmatic motion.  No wheezing.  HEART:  S1 and S2 is soft, systolic murmur.  PMI normal status post  sternotomy.  ABDOMEN:  Benign.  Bowel sounds positive, no AAA, no tenderness, no  bruit, no hepatosplenomegaly, or hepatojugular reflux.  EXTREMITIES:  Distal pulses were intact.  No edema.  NEURO:  Nonfocal.  SKIN:  Warm and dry.  MUSCULOSKELETAL:  No muscular weakness.   IMPRESSION:  1. Coronary artery disease, previous coronary artery bypass graft.  No      chest pain.  Continue aspirin and beta-blocker, nonischemic Myoview      recently.  2. High-grade left carotid disease.  Follow with Dr. Scot [name] for      carotid endarterectomy, right carotid endarterectomy patent without      significant restenosis.  3. Hypertension, currently well controlled.  Continue current dose of      amlodipine and carvedilol.     [name]. William Cancel, MD, [facility]  Electronically Signed    PCN/MedQ  DD: 12/30/2007  DT: 12/31/2007  Job #: [id number]

## 2010-09-10 NOTE — Discharge Summary (Signed)
[name], [name]              ACCOUNT NO.:  [id number]   MEDICAL RECORD NO.:  [id number]          PATIENT TYPE:  INP   LOCATION:  8                         FACILITY:  [facility]   PHYSICIAN:  [name]. Johnsie Cancel, MD, FACCDATE OF BIRTH:  July 09, 1928   DATE OF ADMISSION:  10/24/2008  DATE OF DISCHARGE:  10/27/2008                               DISCHARGE SUMMARY   PRIMARY CARDIOLOGIST:  Collier Salina C. Johnsie Cancel, MD, [facility]   PRIMARY CARE PHYSICIAN:  [place] Maceo Pro, MD   VASCULAR SURGEON:  Judeth Cornfield. Scot Dock, MD   PROCEDURES PERFORMED DURING HOSPITALIZATION:  1. Cardiac catheterization dated October 26, 2008, that was completed by      Dr. Sherren Mocha.      a.     Severe native three-vessel coronary artery disease, status       post coronary artery bypass surgery with 7/7 grafts widely patent,       mild left ventricular dysfunction with an estimated ejection       fraction of 45-50%, recommend ongoing medical therapy.   FINAL DISCHARGE DIAGNOSES:  1. Chest pain.  2. Status post aortocoronary bypass surgery in 1998 with left internal      mammary artery to left anterior descending, saphenous vein graft to      diagonal 1, saphenous vein graft to ramus, first obtuse marginal      artery, and second obtuse marginal artery, saphenous vein graft to      posterior descending artery and posterolateral artery.  3. Status post Myoview in March 2009 showing no ischemia with an      ejection fraction of 65%.  4. Status post cardiac catheterization on October 26, 2008, revealing 7/7      patent grafts.  5. History of transient ischemic attack.  6. Diet-controlled diabetes.  7. Hypertension.  8. Hyperlipidemia.  9. Macular degeneration.  10.Status post bilateral carotid endarterectomy.  11.Peripheral vascular disease with ABI on the left of 0.4.   HOSPITAL COURSE:  This is a very pleasant 77 year old male with previous  history of CAD, who woke up with epigastric pressure and nausea around  3:30 a.m.  the day of admission, the symptoms continued for several hours  and he was seen in the emergency room approximately 10:00 a.m., still  complaining of chest pressure and diaphoresis.  There was no associated  shortness of breath, dizziness, or syncope.  The patient was seen at [facility] [place] on July 26 for similar symptoms with followup per  Dr. Johnsie Cancel after that visit.  However, the patient's symptoms became  more intense, and he was seen in the emergency room.  The patient was  admitted to rule out myocardial infarction or recurrent angina in the  setting of known CAD with bypass grafting.  The patient was planned for  cardiac catheterization secondary to his symptoms and known history.   In the interim, the patient's cardiac enzymes were cycled to evaluate  for myocardial infarction.  It was found that the enzymes were negative  x3 with a troponin of 0.01, 0.01, and 0.03 respectively.  The patient  was continued on heparin until followup catheterization was planned.  EKG did not show any acute ST-T wave changes.  There was mild depression  in the inferior lateral leads, which was unchanged since prior EKG.   The patient's medications remained the same with the exception of HCTZ  during hospitalization and this was discontinued.  Cardiac  catheterization was completed as stated above revealing 7/7 of the  grafts were patent.  The patient tolerated the procedure well without  evidence of bleeding, hematoma, or signs of infection.  On the day  following catheterization, the patient was seen and examined by Dr.  Jenkins Rouge and found to be stable for discharge.  He reviewed the  catheterization results with the patient.  He will continue on current  medication regimen with the exception of the HCTZ.  He will follow up  with Dr. Johnsie Cancel in approximately 3 months for continued cardiac  management.  He is to follow up with his primary care physician for  medical management.    DISCHARGE LABORATORIES:  Sodium 137, potassium 4.1, chloride 109, CO2 of  23, glucose 108, BUN 23, and creatinine 1.14.  Hemoglobin 12.5,  hematocrit 35.9, white blood cells 5.3, and platelets 200.  Cardiac  enzymes were found to be negative.  Hemoglobin A1c was 5.8, cholesterol  104, lipids 102, HDL 33, and LDL 51.   DISCHARGE VITAL SIGNS:  Blood pressure 153/72, pulse 69, respirations  20, and temperature 99.2.   DISCHARGE MEDICATIONS:  1. Carvedilol 12.5 mg twice a day.  2. Amlodipine 5 mg daily.  3. Lisinopril 20 mg daily (new prescription provided).  4. Lipitor 20 mg daily.  5. Aspirin 81 mg daily.  6. PreserVision 2 tablets daily.  7. Zinc 50 mg daily.  8. Cod liver oil gel tablet daily.   ALLERGIES:  PENICILLIN.   FOLLOWUP PLANS AND APPOINTMENT:  1. The patient is to follow up with Dr. Jenkins Rouge on January 30, 2009, at 9 a.m.  2. The patient is to follow up with Dr. Maceo Pro, primary care physician      on his own accord for continued medical management.  3. The patient has been given post cardiac catheterization      instructions with particular emphasis on the right groin site for      evidence of bleeding, hematoma, or signs of infection.   TIME SPENT WITH THE PATIENT TO INCLUDE PHYSICIAN TIME:  30 minutes.     Phill Myron. Purcell Nails, NP      [name]. Johnsie Cancel, MD, [facility]  Electronically Signed   KML/MEDQ  D:  10/27/2008  T:  10/27/2008  Job:  [id number]   cc:   Herbie Baltimore L. Maceo Pro, M.D.

## 2010-09-10 NOTE — Consult Note (Signed)
VASCULAR SURGERY CONSULTATION   [name], [name] D  DOB:  1929/04/23                                       01/04/2008  [id number]   I saw the patient in the office today in consultation concerning a  greater than 80% left carotid stenosis.  He was referred by Dr. Johnsie Cancel.  Dr. Johnsie Cancel has been following a moderate left carotid stenosis for  approximately 5 years.  On his most recent followup duplex scan on  September 3 the stenosis on the left had progressed to greater than 80%.  Of note, he is right-handed.  He denies any history of stroke, TIAs,  expressive or receptive aphasia or amaurosis fugax.  He has undergone a  previous right carotid endarterectomy by Dr. Arlyce Dice in 1994.  There is  no evidence of recurrent carotid stenosis on the right.   PAST MEDICAL HISTORY:  His past medical history is significant for diet  controlled diabetes.  In addition, he has hypertension and  hypercholesterolemia.  He denies any previous history of myocardial  infarction, history of congestive heart failure or history of COPD.  He  did undergo coronary revascularization in 1999.   FAMILY HISTORY:  There is no history of premature cardiovascular  disease.   SOCIAL HISTORY:  He is married.  He has two children.  He quit tobacco  45 years ago.   ALLERGIES:  Penicillin.   MEDICATIONS:  1. Carvedilol 12.5 mg p.o. b.i.d.  2. Amlodipine 5 mg p.o. daily.  3. Lisinopril/hydrochlorothiazide 20/12.5 one p.o. daily.  4. Lipitor 20 mg p.o. daily.  5. Aspirin 81 mg p.o. daily.  6. PreserVision one p.o. b.i.d.  7. Zinc 50 mg p.o. daily.  8. Multivitamin one p.o. daily.   REVIEW OF SYSTEMS:  GENERAL:  He has had no recent weight loss, weight  gain, problem with appetite or fever.  CARDIAC:  He has had no chest pain, chest pressure, palpitations or  arrhythmias.  PULMONARY:  He has had no productive cough, bronchitis, asthma or  wheezing.  GI:  He has had no recent change in his  bowel habits and has no history  of peptic ulcer disease.  GU:  He has had no dysuria or frequency.  VASCULAR:  He occasionally gets some pain in his left calf with  ambulation but can walk a mile before experiencing symptoms.  He has no  history of rest pain, nonhealing ulcers, DVT or phlebitis.  NEUROLOGICAL:  He has had no history of dizziness, blackouts, headaches  or seizures.  ORTHO:  He has no significant arthritis, joint pain or muscle pain.  HEENT:  He has had no recent change in his eyesight.  HEMATOLOGIC:  He has had no bleeding problems or clotting disorders.   PHYSICAL EXAMINATION:  General:  This is a pleasant 77 year old  gentleman who appears his stated age.  Vital signs:  Blood pressure is  152/72, heart rate is 64.  Neck:  I do not detect any carotid bruits.  Lungs:  Are clear bilaterally to auscultation.  Cardiac:  He has a  regular rate and rhythm.  Abdomen:  Soft and nontender.  He has normal  pitched bowel sounds.  I cannot palpate an aneurysm.  He has palpable  femoral pulses.  Both feet are warm and well-perfused without ischemic  ulcers.  He has no significant lower  extremity swelling.  Neurological:  Nonfocal with good strength in his upper extremities and lower  extremities bilaterally.   I reviewed his Doppler study which shows no evidence of recurrent  carotid stenosis on the right.  On the left side he has a greater than  80% stenosis with an end-diastolic velocity in the proximal internal  carotid artery at 120 cm/sec.   Given the severity of the stenosis on the left I have recommended left  carotid endarterectomy in order to lower his risk of future stroke.  We  have discussed the indications for the procedure and potential  complications including but not limited to bleeding, stroke  (periprocedural risk 1-2%), nerve injury, MI or other unpredictable  medical problems.  All his questions were answered.  He is agreeable to  proceed.  He knows to  continue his aspirin right up to surgery.  His  surgery has been scheduled for 01/13/2008.   Judeth Cornfield. Scot Dock, M.D.  Electronically Signed  CSD/MEDQ  D:  01/04/2008  T:  01/05/2008  Job:  [id number]   cc:   [name]. Johnsie Cancel, MD, [place] Maceo Pro, M.D.

## 2010-09-10 NOTE — Discharge Summary (Signed)
[name], [name]              ACCOUNT NO.:  [id number]   MEDICAL RECORD NO.:  [id number]          PATIENT TYPE:  INP   LOCATION:  3312                         FACILITY:  [facility]   PHYSICIAN:  Judeth Cornfield. Scot Dock, M.D.DATE OF BIRTH:  1928-09-19   DATE OF ADMISSION:  01/13/2008  DATE OF DISCHARGE:  01/14/2008                               DISCHARGE SUMMARY   REASON FOR ADMISSION:  Asymptomatic greater than 80% left carotid  stenosis.   POSTOPERATIVE DIAGNOSIS:  Asymptomatic greater than 80% left carotid  stenosis.   PROCEDURE PERFORMED THIS ADMISSION:  Left carotid endarterectomy with  Dacron patch angioplasty.   HISTORY:  This is a pleasant 77 year old gentleman who was referred by  Dr. Johnsie Cancel with an asymptomatic greater than 80% left carotid stenosis.  Left carotid endarterectomy was recommended in order to lower his risk  of future stroke.  He was admitted for elective surgery.  Remainder is  history and physical is as dictated without addition or deletion.   HOSPITAL COURSE:  The patient was admitted on January 13, 2008.  He  underwent an uneventful left carotid endarterectomy with Dacron patch  angioplasty.  He awoke neurologically intact.  His blood pressure was  well controlled.  He did have some nausea on the first postoperative  night, but this resolved by postoperative day #1.  By postoperative day  #1, he was ambulating, voiding, and eating without difficulty.  He was  neurologically intact and was discharged to home with followup in 2  weeks.   DISCHARGE DIAGNOSIS:  Greater than 80% left carotid stenosis.   SECONDARY DIAGNOSES:  1. Diabetes.  2. Hypertension.  3. Hypercholesterolemia.   MEDICATIONS ON DISCHARGE:  1. Carvedilol 12.5 mg p.o. b.i.d.  2. Amlodipine 5 mg p.o. daily.  3. Lisinopril/hydrochlorothiazide 12.5 one p.o. daily.  4. Lipitor 20 mg p.o. daily.  5. Aspirin 81 mg p.o. daily.  6. PreserVision one p.o. b.i.d.  7. Zinc 50 mg p.o.  daily.  8. Multivitamin one p.o. daily.   CONDITION ON DISCHARGE:  Good.  Discharge is to home.       Judeth Cornfield. Scot Dock, M.D.  Electronically Signed     CSD/MEDQ  D:  01/14/2008  T:  01/14/2008  Job:  [id number]   cc:   [name]. Johnsie Cancel, MD, [place] Maceo Pro, M.D.

## 2010-09-10 NOTE — Cardiovascular Report (Signed)
NAMECLEVIE, DO              ACCOUNT NO.:  [id number]   MEDICAL RECORD NO.:  [id number]           PATIENT TYPE:   LOCATION:                                 FACILITY:   PHYSICIAN:  Juanda Bond. Burt Knack, MD  DATE OF BIRTH:  March 19, 1929   DATE OF PROCEDURE:  10/26/2008  DATE OF DISCHARGE:                            CARDIAC CATHETERIZATION   PROCEDURES:  Left heart catheterization, selective coronary angiography,  left ventricular angiography, saphenous vein graft angiography, left  internal mammary artery angiography.   INDICATION:  [name] is a 77 year old gentleman with known CAD.  He  is 11 years out from 7-vessel bypass.  He presented with chest pain  concerning for angina.  He ruled out for myocardial infarction.  He was  referred for cardiac cath.   Risks and indications of the procedure were reviewed with the patient.  Informed consent was obtained.  The right groin was prepped, draped, and  anesthetized with 1% lidocaine.  Using the modified Seldinger technique,  a 5-French sheath was placed in the right femoral artery.  Standard 5-  French Judkins catheters were used for coronary angiography and left  ventriculography.  The JR4 catheter was used for saphenous vein graft  angiography and LIMA angiography.  All catheter exchanges were performed  over guidewire.  The patient tolerated the procedure well.  There were  no immediate complications.   The aortic pressure 112/52, mean of 75, left ventricular pressure  119/16.   Coronary angiography:  The left mainstem is diffusely diseased.  There  is a 70% ostial stenosis present.  The left main bifurcates into the LAD  and left circumflex.   LAD:  The LAD courses down to the LV apex.  The vessel is diffusely  diseased.  There are serial stenoses throughout the proximal LAD.  Distally, the vessel fills competitively from the left internal mammary.  There are 90% tandem stenoses in the proximal LAD, most notably at the  origin of the first septal perforator.  There is a large first diagonal  branch that fills predominantly from the graft.  The diagonal appears to  have moderate ostial stenosis.   Left circumflex:  The left circumflex has 95% ostial stenosis.  This  occurs just at the branch point of an intermediate.  Circumflex then  courses down, and the first OM is occluded.  The second OM is occluded.  The third OM is patent but has a 90% stenosis in the distal circ prior  to the OM.   Right coronary artery:  The right coronary artery is totally occluded in  the proximal portion.  There is no antegrade filling of that vessel.   Saphenous vein graft angiography:  1. Saphenous vein graft is sequenced to ramus intermedius, first OM,      second OM.  This graft is widely patent with no significant      stenosis.  The intermediate is a very small branch.  The OMs are      moderate size branches.  Distally, the OMs had no significant      stenosis present, and the  left circumflex system fills in      retrograde fashion from the graft.  2. Saphenous vein graft to first diagonal:  This graft is widely      patent with no significant stenosis.  3. Saphenous vein graft sequenced to the right PDA and right      posterolateral branches is widely patent.  There is no significant      stenosis present.  The right coronary artery fills in retrograde      fashion from the graft.  4. LIMA to LAD is widely patent.  Anastomotic site of the mid LAD is      patent.  There is no significant stenosis throughout.   Left ventriculography:  There is mild diffuse hypokinesis of the left  ventricle.  LVEF is estimated at 45-50%.  There is no significant mitral  regurgitation.   ASSESSMENT:  1. Severe native three-vessel coronary artery disease as outlined.  2. Status post coronary bypass surgery with 7/7 grafts widely patent.  3. Mild left ventricular dysfunction with an estimated left      ventricular ejection fraction  45-50%.   PLAN:  Recommend ongoing medical therapy.  The patient has had an  excellent revascularization and is now over 10 years out from surgery  with all grafts wide open.      Juanda Bond. Burt Knack, MD  Electronically Signed     MDC/MEDQ  D:  10/26/2008  T:  10/27/2008  Job:  [id number]   cc:   [name]. Johnsie Cancel, MD, [facility]

## 2010-09-10 NOTE — Assessment & Plan Note (Signed)
[facility] OFFICE NOTE   [name], [name]                     MRN:          [id number]  DATE:07/02/2007                            DOB:          28-Feb-1929    [name] returns today for followup.   He is a vasculopath.  He has had previous coronary bypass surgery.  His  last heart cath in September of 2006 showed patent grafts.  He does have  some distal native disease.  His CABG was in 1999 by Dr. Roxan Hockey.  He has also had a right CEA by Dr. Arlyce Dice many years ago.  His Myoview  study today was nonischemic with some inferior basal thinning.  It  really did not look much different than in 2003.  His EF was 65%.  He is  active and not having chest pain.  He has had a previous mini TIA with  numbness in his fingers resulting in a right CEA by Dr. Arlyce Dice many  years ago.  We have been following his carotids.  He had a carotid  duplex today with 0 to 39% stenosis on the right.  He continues to have  60-79% stenosis on the left, however.  His proximal ICA velocity is 3  meters per second.  His diastolics are still under 100.  I had a lengthy  discussion with Garrison today.  He is a very astute person.  He clearly  has not had any TIAs or CVA-like symptoms.  He is taking an aspirin a  day.  He is extremely healthy for his age and will undoubtedly need a  left CEA sometime in the next year or two.  He will have a followup  carotid duplex in 6 months.   MEDICATIONS:  1. Aspirin 81 mg a day.  2. Lipitor 20 mg a day.  3. Lisinopril HCTZ 20/12.5.  4. Zinc.  5. Multivitamins.  6. Calan 120 mg 1/2 tablet a day.  7. Fish oil.  8. Lopressor 25 b.i.d.   REVIEW OF SYSTEMS:  His review of systems otherwise negative.   PHYSICAL EXAMINATION:  GENERAL:  A healthy-appearing 81-year male who  looks younger than his stated age.  VITAL SIGNS:  His weight is 182.  Blood pressure 140/60, pulse 61 and  regular, respiratory  rate 16, afebrile.  HEENT:  Unremarkable.  NECK:  Carotids have a left carotid bruit.  JVP is not elevated.  No  lymphadenopathy, thyromegaly, or adenopathy.  LUNGS:  Clear to diaphragmatic motion.  No wheezing.  CARDIAC:  There is an S1 and S2 with a soft systolic murmur.  PMI  normal.  ABDOMEN:  Benign.  Bowel sounds positive.  No AAA, no tenderness, no  hepatosplenomegaly, no hepatojugular reflexes.  EXTREMITIES:  Distal pulses are intact.  No edema.  NEUROLOGIC:  Nonfocal.  SKIN:  Warm and dry.  No muscular weakness.   His carotid duplex exam as well as images and his Myoview were reviewed.  Time 10 minutes for both.   IMPRESSION:  1. Coronary artery disease, previous coronary artery bypass graft in  1999.  No angina.  Continue aspirin and beta blocker.  2. Carotid artery disease.  Followup carotid duplex in 6 months.  60-      79% left-sided residual disease.  3. Hypercholesterolemia in the setting of vasculopathy.  Continue      Lipitor 20 a day.  Lipid and liver profile in 6 months.  4. Hypertension, currently well-controlled.  Continue current dose of      lisinopril, low-salt diet.  I will see the patient back in 6 months when he has his followup duplex.     [name]. Johnsie Cancel, MD, [facility]  Electronically Signed    PCN/MedQ  DD: 07/02/2007  DT: 07/03/2007  Job #: [id number]

## 2010-09-10 NOTE — Op Note (Signed)
[name], [name]              ACCOUNT NO.:  [id number]   MEDICAL RECORD NO.:  [id number]          PATIENT TYPE:  INP   LOCATION:  3312                         FACILITY:  [facility]   PHYSICIAN:  Judeth Cornfield. Scot Dock, M.D.DATE OF BIRTH:  [birth year]   DATE OF PROCEDURE:  01/13/2008  DATE OF DISCHARGE:                               OPERATIVE REPORT   PREOPERATIVE DIAGNOSIS:  Asymptomatic greater than 80% left carotid  stenosis.   POSTOPERATIVE DIAGNOSIS:  Asymptomatic greater than 80% left carotid  stenosis.   PROCEDURE:  Left carotid endarterectomy with Dacron patch angioplasty.   SURGEON:  Judeth Cornfield. Scot Dock, MD   ASSISTANT:  Jacinta Shoe, PA   ANESTHESIA:  General.   INDICATIONS:  This is a pleasant 77 year old gentleman, who was referred  by Dr. Johnsie Cancel, with an asymptomatic greater than 80% left carotid  stenosis, left carotid endarterectomy was recommended in order to lower  his risk of future stroke.  The procedure and potential complications  were discussed with the patient preoperatively.  All his questions were  answered and he was agreeable to proceed.   TECHNIQUE:  The patient was taken to the operating room and received a  general anesthetic.  The left neck was prepped and draped in the usual  sterile fashion.  After the longitudinal incision was made along the  anterior border of the sternocleidomastoid, the dissection was carried  down to the common carotid artery, which was dissected free and  controlled with Rumel tourniquet.  The facial vein was divided between 2-  0 silk ties.  The internal carotid artery was controlled above the  plaque, it was fairly large artery.  The external and superior thyroid  arteries were controlled.  The patient was then heparinized.  Clamps  were then placed on the internal, then the external, then the common  carotid artery, and then a longitudinal arteriotomy was made in the  common carotid artery and this was  extended to the plaque into the  internal carotid artery.  There was brisk back bleeding from the  internal carotid artery.  The shunt did not go easily and therefore  rather than risk of dissection or injury, I elected to perform the  procedure without the shunt as the patient had excellent back bleeding.  An endarterectomy plane was established proximally, and the plaque was  sharply divided.  Eversion endarterectomy was performed of the external  carotid artery.  Distally, there was a nice taper in the plaque and no  tacking sutures were required.  The artery was irrigated with copious  amounts of heparin and dextran, and all loose debris removed.  Next, the  Dacron patch was sewn using continuous 6-0 Prolene suture.  Prior to  completing the patch closure, the arteries were back bled and flushed  appropriately, and the anastomosis completed.  Flow was reestablished  first to the external carotid artery and then to the internal carotid  artery.  Distally, there was a nice pulse distal to the patch and a good  Doppler signal.  The heparin was partially reversed with protamine.  The  wound was  closed with deep layer of 3-0 Vicryl.  The platysma  was closed with running 3-0 Vicryl.  The skin was closed with a 4-0  subcuticular stitch.  Sterile dressing was applied.  The patient  tolerated the procedure well and awoke neurologically intact.  All  needle and sponge counts were correct.      Judeth Cornfield. Scot Dock, M.D.  Electronically Signed     CSD/MEDQ  D:  01/13/2008  T:  01/13/2008  Job:  [id number]   cc:   [name]. Johnsie Cancel, MD, [facility], M.D.

## 2010-09-13 NOTE — Discharge Summary (Signed)
[name], [name] NO.:  [id number]   MEDICAL RECORD NO.:  [id number]          PATIENT TYPE:  INP   LOCATION:  2038                         FACILITY:  [facility]   PHYSICIAN:  Minus Breeding, M.D.   DATE OF BIRTH:  [birth year]   DATE OF ADMISSION:  12/26/2004  DATE OF DISCHARGE:  12/28/2004                                 DISCHARGE SUMMARY   PRIMARY CARE PHYSICIAN:  [name]. Maceo Pro, M.D.   PRIMARY CARDIOLOGIST:  Jenkins Rouge, M.D.   DISCHARGE DIAGNOSES:  1.  Severe fatigue and palpitations.  2.  Frequent premature ventricular complexes/ventricular bigeminy.   PAST MEDICAL HISTORY:  1.  Coronary artery disease, status post coronary artery bypass grafting in      1999.  Last documented assessment of ischemia was he had an Adenosine      Cardiolite in June 2003, at which time the patient had no evidence of      ischemia with an EF of 64%.  2.  History of carotid artery disease, status post previous right carotid      endarterectomy in 1993, most recent carotid duplex scan in 2004,      revealed less than 40% right internal carotid artery stenosis and a 40      to 60% left internal carotid artery stenosis.  3.  Hyperlipidemia.  4.  Questionable borderline diabetes.  5.  Hypertension.  6.  Recent reported bout of diverticulitis treated with two antibiotics per      patient approximately one month ago complicated by diarrhea which      apparently has improved, but not resolved.   HISTORY OF PRESENT ILLNESS:  [name] is a 77 year old gentleman with  known history of coronary artery disease who presented to [facility] with a  week long complaints of weakness and fatigue associated with palpitations,  but no chest pain or shortness of breath.  EKG was nonspecific ST segment  changes with frequent premature ventricular complexes.  BNP level was  elevated in the 900 range.  D-dimer level was noted to be elevated, although  at this point pulmonary embolus seemed  unlikely.  The patient was admitted  with plans to repeat cardiac catheterization for diagnostic purposes.   HOSPITAL COURSE:  The patient went to the cath laboratory by Dr. Pierre Bali.  Cath report showing severe native three vessel coronary disease  with 80 to 90% ostial left main and totally occluded right coronary artery.  Essentially intact surgical revascularization with 6/7 bypass grafts open.  The saphenous vein graft to the ramus branch was totally occluded, but there  is good flow in the native vessel.  Normal right heart pressures and left  sided filling pressures.  Evidence of frequent previous PVC's and a  considerable amount of bigeminy during the catheterization.  It was decided  to re-evaluate [name]'s medications.  His beta blocker dose was  decreased, started on low dose Verapamil which he tolerated.  A 2-D  echocardiogram was done on December 27, 2004, showing an EF of 55 to 65%.  The patient tolerated dosages of Verapamil.  He is being discharged home on  120 mg daily.  If the patient continues to complain of increased weakness,  he may need anti-arrhythmic medication.  This will be evaluated by Dr.  Johnsie Cancel in office setting.   DISCHARGE INSTRUCTIONS:  1.  Diet restrictions:  Low fat, low salt, low cholesterol.  2.  Activity:  Increase activity slowly.  He may shower.  No driving for two      days, no lifting over 10 pounds x1 week, no tub bathing x2 days.  3.  His beta blocker has been decreased to Lopressor 25 mg p.o. b.i.d.  4.  Calan 120 mg p.o. daily.  5.  Lipitor 20 mg daily.  6.  Aspirin 325 mg daily.  7.  Lisinopril/HCTZ 20/12.5 mg daily.  8.  Follow up with [facility] next week for a post-cath check, and then      follow up with Dr. Johnsie Cancel in four to six weeks.  I have called the      office and left an appointment there to call the patient at home on      Tuesday to give him appointment date.  He is to call our office for any      problems  from his cath site.  9.  We will arrange for the patient to have a CBC rechecked in our office.   DISCHARGE LABORATORY DATA:  Magnesium 2.  Potassium 5, BUN 27, creatinine  1.3.  Hemoglobin 11.7, hematocrit 32.8.  TSH 2.994 on December 27, 2004.  Lipid panel this admission:  Total cholesterol 99, triglycerides 135, HDL  23, LDL 49.      Rosanne Sack, NP    ______________________________  Minus Breeding, M.D.    MB/MEDQ  D:  12/28/2004  T:  12/28/2004  Job:  [id number]   cc:   Jenkins Rouge, M.D.  [address]  [place] [address]   [name]. Maceo Pro, M.D.  [address] [place]  [place] [address]  Fax: [phone]   Minus Breeding, M.D.  [phone] [address] [place]  [place] [address]

## 2010-09-13 NOTE — Procedures (Signed)
[place]  Patient:    [name], [name] Visit Number: [id number] MRN: [id number]          Service Type: END Location: ENDO Attending Physician:  Sherrin Daisy Dictated by:   Joyice Faster. Oletta Lamas, M.D. Proc. Date: 06/22/01 Admit Date:  06/22/2001   CC:         Briscoe Deutscher, M.D.   Procedure Report  PROCEDURE:  Colonoscopy.  MEDICATIONS:  Fentanyl 90 mcg, Versed 7.5 mg.  SCOPE:  Olympus adult video colonoscope.  INDICATION:  Colon cancer screening.  DESCRIPTION OF PROCEDURE:  The procedure had been explained to the patient and consent obtained.  With the patient in the left lateral decubitus position, the adult Olympus video colonoscope inserted, advanced under direct visualization.  The prep was excellent.  The patient had fairly extensive diverticular disease.  Once we were able to pass this area, we were able to advance rapidly to the cecum.  The appendiceal orifice and the ileocecal valve were seen.  Scope withdrawn, and the cecum, ascending colon, hepatic flexure, transverse colon, splenic flexure, descending, and sigmoid colon were seen well upon withdrawal.  There was again extensive diverticular disease in the sigmoid colon.  No other gross lesions seen.  The scope was withdrawn.  The patient tolerated the procedure well.  The rectum was free of lesions upon withdrawal.  He was monitored on oxygen monitor, given low-flow oxygen, blood pressure and cardiac status were monitored.  ASSESSMENT:  Essentially normal screening colonoscopy other than diverticulosis.  PLAN:  Routine follow-up with yearly Hemoccults.  Consider another exam in 10 years. Dictated by:   Joyice Faster. Oletta Lamas, M.D. Attending Physician:  Sherrin Daisy DD:  06/22/01 TD:  06/22/01 Job: [id number]

## 2010-09-13 NOTE — Assessment & Plan Note (Signed)
[facility] OFFICE NOTE   ABNER, SPERANDIO                     MRN:          [id number]  DATE:07/10/2006                            DOB:          February 25, 1929    Mr. Pacetti returns for followup.  He is status post CABG in 1999.  He  had a cath in 2006 with patent grafts.  He has had previous right CEA by  Dr. Arlyce Dice.  He has a 60-80% left ICA stenosis.  His last duplex was in  March of 2008.  He will have a followup Myoview and duplex in March of  2009.   He has not had any significant chest pain, PND, orthopnea or TIAs.  He  has been traveling to [place] and golfing and, in general, he has been  doing well.   MEDICATIONS INCLUDE:  1. An aspirin a day.  2. Lipitor 20 a day.  3. Lisinopril/hydrochlorothiazide 20/12.5.  4. Calan 120 a half tablet a day.  5. Lopressor 25 b.i.d.   He has had some bigeminy and PVCs in the past that seem to be controlled  well with the combination of beta blockers and Verapamil.   ON EXAM:  Blood pressure is 128/70, pulse 70 and regular.  There are no  PVCs.  HEENT:  Normal.  He is status post right CEA.  I cannot hear any  residual bruits.  LUNGS:  Clear.  He has an S1, S2 with normal heart sounds.  ABDOMEN:  Benign.  LOWER EXTREMITIES:  With intact pulses, no edema.  NEUROLOGIC:  Nonfocal.  SKIN:  Warm and dry.   IMPRESSION:  Stable, status post CABG, with preserved LV function.  No  evidence of recurrent PVCs.  Patient is not having angina.  Residual  vascular disease in the carotids.  Follow up carotid duplex and  adenosine Myoview in March of 2009.   Patient did not need any refills on his medications.  Dr. Maceo Pro in [facility] is following his LFTs and cholesterol.     [name]. Johnsie Cancel, MD, [facility]  Electronically Signed    PCN/MedQ  DD: 07/10/2006  DT: 07/11/2006  Job #: [id number]

## 2010-09-13 NOTE — Cardiovascular Report (Signed)
[name], VISCOMI NO.:  [id number]   MEDICAL RECORD NO.:  [id number]          PATIENT TYPE:  INP   LOCATION:  2038                         FACILITY:  [facility]   PHYSICIAN:  Glori Bickers, M.D. [facility] OF BIRTH:  [birth year]   DATE OF PROCEDURE:  DATE OF DISCHARGE:                              CARDIAC CATHETERIZATION   IDENTIFICATION:  Mr. Pigue is a delightful 77 year old male with a  history of coronary disease status post seven vessel CABG in 1999 by Dr.  Merilynn Finland, who was admitted with a week history of progressive  severe fatigue and exercise intolerance. He has not had any chest pain or  shortness of breath. He is ruled out for myocardial infarction and is  referred for cardiac catheterization. He does have some mild chronic renal  insufficiency and he was pretreated with Mucomyst.   PROCEDURES PERFORMED:  1.  Selective coronary angiography.  2.  LIMA angiography.  3.  Saphenous vein graft angio.  4.  Left subclavian angiography.  5.  Left heart cath, but no V-gram secondary to his renal insufficiency.  6.  Right heart catheterization with Fick cardiac output.  7.  AngioSeal femoral closure device.   DESCRIPTION OF PROCEDURE:  The risks and benefits of catheterization were  explained to Mr. Mikesell; consent was signed and placed on the chart. I did  discuss with him the possible worsening of his kidney function with contrast  and he agreed to proceed. A 6-French arterial sheath was placed in the right  femoral artery using a modified Seldinger technique. A JL4 catheter was used  to image the left system. A JR-4 was used for the right coronary artery as  well as three saphenous vein grafts.  An IMA catheter was used to look at  the LIMA. This was placed using a long exchange wire. Angled pigtail was  used to measure left heart pressures. A 7-French venous sheath was also  placed in the right femoral vein. Standard Swan-Ganz catheter was  used for  right heart cath with Fick cardiac outputs. All catheter exchanges were made  over wire. There were no apparent complications. At the end of the  procedure, the patient had his right femoral artery sealed with an Angio-  Seal closure device with good hemostasis. His venous sheath was pulled and  manual pressure was held before he left the lab.   FINDINGS:  Right atrial pressure is mean of 6, RV pressure of 34/1. PA  pressure 33/10 with a mean of 21, pulmonary capillary wedge had a mean of  12.  With his PVCs, there was a V-wave up to 24. Fick cardiac output was 5.4  liters per minute. Fick cardiac index was 2.8 liters per minute per sq m.  Pulmonary vascular resistance was 1.67 Woods units. Central aortic pressure  was 103/42 with a mean of 73. LV pressure was 111/10 with an EDP of 18.   CORONARY ANATOMY:  Left main had an 80 to 90% ostial lesion.   LAD was a long vessel that coursed to the apex. It gave off a single long  diagonal  in the mid section. Within the LAD there was a high-grade 95%  stenosis in the mid portion just after the takeoff of the first diagonal.  Following this, there was a long tubular 70% stenosis followed by a 50%  stenosis. The distal LAD was free of critical disease. In the first diagonal  there was an 80% ostial lesion.   The LIMA to the LAD distal LAD was widely patent with good runoff.   The saphenous vein graft to the first diagonal was widely patent with good  runoff. The distal diagonal was free of significant disease.   The left circumflex was a large vessel, it gave off a moderate-sized ramus,  an OM1 and OM2 which had multiple branches. In the proximal circumflex there  was focal 95% stenosis followed by a tubular 70 to 80% stenosis before the  takeoff of the first OM. In the ramus there is a 50% ostial stenosis. The  OM1 was totally occluded at its origin. The OM2 was totally occluded the  upper branch and had a high-grade 99% lesion in  the lower branch.   The saphenous vein graft sequential to the ramus, the OM1 and the OM2. The  branch to the ramus was totally occluded.  The branch of the OM1 and OM2  were widely patent with good filling of those vessels. There are no  significant stenoses distal to the vein graft anastomosis.   Right coronary artery was totally occluded after the takeoff of the conus  branch.   Saphenous vein graft sequential to the PDA and PL was widely patent. There  was good filling of the PDA as well as the PL.  The distal PL was small and  diffusely diseased. On back filled the right coronary artery, there was  evidence of a high-grade 95% stenosis in the distal RCA just before the  takeoff of the PDA.   Left ventriculogram was not done due to renal insufficiency. Echocardiogram  is pending.   ASSESSMENT:  1.  Severe native three-vessel coronary disease with 80 to 90% ostial left      main and totally occluded right coronary artery.  2.  Essentially intact surgical revascularization with 6/7 bypass grafts      open. The saphenous vein graft to the ramus branch is totally occluded      but there is good flow in the native vessel.  3.  Normal right heart pressures and a left-sided filling pressures.  4.  The patient had evidence of frequent previous PVCs and considerable      amount of bigeminy during the catheterization.   DISCUSSION:  I suspect Mr. Bonsell's fatigue is likely secondary to his  PVCs and reduction of his effective of cardiac output. He is already on a  high dose of beta blockade and we will continue this. We will observe him  overnight, watching his BUN and creatinine closely and he likely can be  discharged the morning. If his fatigue persists, he may need to consider  antiarrhythmic therapy or possibly a cardiopulmonary exercise test. As  above, we will get an echocardiogram before he leaves to evaluate his LV  function.      Glori Bickers, M.D. [facility]   Electronically Signed     DB/MEDQ  D:  12/27/2004  T:  12/27/2004  Job:  [id number]   cc:   Herbie Baltimore L. Maceo Pro, M.D.  [address] [place]  [place] [address]  Fax: [phone]   Jenkins Rouge,  M.D.  [id number] [place]  [place] [address]

## 2010-09-13 NOTE — H&P (Signed)
[name], [name] NO.:  [id number]   MEDICAL RECORD NO.:  [id number]          PATIENT TYPE:  INP   LOCATION:  [id number]                         FACILITY:  [facility]   PHYSICIAN:  Satira Sark, M.D. LHCDATE OF BIRTH:  February 21, 1929   DATE OF ADMISSION:  12/26/2004  DATE OF DISCHARGE:                                HISTORY & PHYSICAL   PRIMARY CARE PHYSICIAN:  [name]. Maceo Pro, M.D.   CARDIOLOGIST:  Jenkins Rouge, M.D.   REASON FOR ADMISSION:  Week-long history of severe fatigue and palpitations.   HISTORY OF PRESENT ILLNESS:  [name] is a pleasant 77 year old  gentleman with known history of multivessel coronary artery disease, status  post coronary artery bypass grafting by Dr. Roxan Hockey in 1999, including a  LIMA to the left anterior descending artery, saphenous vein graft to the  diagonal, saphenous vein graft to the ramus intermedius, first and second  obtuse marginal branches, and saphenous vein graft to the posterior  descending and posterolateral branches of the right coronary artery with  preserved ejection fraction.  Additional history includes carotid artery  disease, status post right carotid endarterectomy, hypertension,  hyperlipidemia, and recent bout of diverticulitis.  [name] was just  seen in the clinic by Dr. Johnsie Cancel on August 29, at that time complaining of a  sense of palpitations.  What the patient explains this evening is a feeling  of progressive weakness, described as severe today and a feeling as if I  just can't go at all.  This has progressed over the last week and has been  associated with a sense of skipped heartbeats and pauses, although without  any sense of rapid palpitations.  He has experienced no chest pain or  shortness of breath with this.  An outpatient evaluation had already been  scheduled, including echocardiography and myocardial perfusion imaging in  the near future by Dr. Johnsie Cancel.  Given progressive symptoms,  [name]  called the office and was referred to the emergency department for further  evaluation.  An electrocardiogram at this time shows a sinus rhythm with  frequent premature ventricular contractions in a pattern of bigeminy without  any acute ST/T wave changes.  His initial point-of-care cardiac markers are  within normal limits, and his chest x-ray shows no acute pulmonary edema or  infiltrates.  BNP level is noted to be increased in the 900 range.   ALLERGIES:  PENICILLIN.   PRESENT MEDICATIONS:  1.  Lopressor 50 mg p.o. b.i.d.  2.  Lipitor 20 mg p.o. daily.  3.  Enteric-coated aspirin 25 mg p.o. daily.  4.  Zinc 50 mg p.o. daily.  5.  Multivitamin 1 p.o. daily.  6.  Lisinopril/HCT 20/12.5 mg p.o. daily.   PAST MEDICAL HISTORY:  1.  Multivessel coronary artery disease with preserved ejection fraction,      status post coronary artery bypass grafting in 1999, as outlined above.      Patient's last documented assessment of ischemia was via an Adenosine      Cardiolite performed in June, 2003, at which time the patient had no  evidence of ischemia with an ejection fraction of 64%.  2.  History of carotid artery disease, status post previous right carotid      endarterectomy in 1993.  Most recent carotid duplex scan in 2004      revealed less than 40% right internal carotid artery stenosis and 40-60%      left internal carotid artery stenosis.  3.  Hyperlipidemia, on statin therapy.  4.  Question borderline diabetes mellitus.  5.  Hypertension.  6.  Recently reported bout of diverticulitis, treated with two antibiotics      approximately one month ago and complicated by diarrhea, which has      apparently improved but not resolved.   SOCIAL HISTORY:  Patient is married.  His wife and son are with him this  evening in the emergency department.  There is no significant ongoing  tobacco or alcohol use history.   FAMILY HISTORY:  Noncontributory at present.   REVIEW OF  SYSTEMS:  As described in the present illness.  Patient denies  having any sense of chest pain or shortness of breath recently.  He has had  no orthopnea or PND.  He says that he did not sleep well last night, as he  could just not get comfortable.  He has had no obvious melena or  hematochezia.  He denies any symptoms of syncope, although with his episode  of fatigue earlier in the day, he did feel dizzy and nauseated.  He has had  no lower extremity edema.  Otherwise systems are negative.   PHYSICAL EXAMINATION:  VITAL SIGNS:  Heart rate is in the 80s to 90s in  sinus rhythm with frequent premature ventricular complexes.  Respirations  are 16-18.  Blood pressure in the systolic range of 0000000.  GENERAL:  This is a well-nourished, somewhat pale-appearing male in no acute  distress.  Denies any chest pain and without shortness of breath.  HEENT:  Conjunctivae and lids are normal.  Pharynx is clear.  NECK:  Supple without elevated jugular venous pressure or loud carotid  bruits.  There is a scar over the right carotid artery, status post carotid  endarterectomy.  No thyromegaly is noted.  LUNGS:  Essentially clear without labored breathing at rest.  CARDIAC:  Regular rate and rhythm with occasional ectopic beat.  Soft basal  systolic murmur with preserved S2 and no S3 gallop or pericardial rub.  ABDOMEN:  Soft and nontender with no guarding.  No hepatomegaly is noted.  EXTREMITIES:  No significant pitting edema.  Peripheral pulses are 1-2+.  SKIN:  No ulcerative changes are noted.  MUSCULOSKELETAL:  No kyphosis is noted.  NEUROLOGIC/PSYCHIATRIC:  Patient is alert and oriented x 3.   LABORATORY DATA:  Hemoglobin 11.9, hematocrit 35, WBCs 6.4, platelets 259,  INR 1.1, D-dimer 0.95.  Sodium 138, potassium 5.2, glucose 126, BUN 37,  creatinine 1.7.  BNP is 906.  Troponin I less than 0.05 x2 by point-of-care  markers.  CK-MB less than 1 x2 by point-of-care markers.  AST 34, ALT 56. Total  bilirubin 0.9.  Alkaline phosphatase 99.   IMPRESSION:  1.  Week-long history of progressive weakness and fatigue associated with      palpitations but no chest pain or shortness of breath.  Today the      patient experienced more intense symptoms associated with nausea and      dizziness.  Objectively, he is noted to have frequent premature      ventricular complexes, although  at this point no nonsustained      ventricular tachycardia.  His initial point-of-care cardiac markers are      normal, and his chest x-ray shows no pulmonary edema.  Electrocardiogram      shows nonspecific ST segment changes with frequent premature ventricular      complexes.  D-dimer level is noted to be elevated, although at this      point pulmonary embolus seems unlikely.  BNP level is elevated in the      900 range.  2.  Known multivessel coronary artery disease, status post coronary artery      bypass grafting in 1999, as outlined. Echocardiography and myocardial      perfusion imaging were already arranged as an outpatient, based on      recent office visits, although it does look as if cardiac      catheterization was discussed with the patient at that time as a      potential option as well.  3.  Renal insufficiency with a creatinine of 1.7 and a potassium of 5.2.      Uncertain about baseline at this point.  It is noted that the patient is      on an ACE inhibitor/hydrochlorothiazide combination and with his      concurrent complaints of diarrhea, although improving, he may have some      element of prerenal azotemia.  4.  Hypertension.  5.  Hyperlipidemia, on statin therapy.  6.  Reported history of recent diverticulitis, diagnosed in the last month.  7.  Carotid artery disease, status post right carotid endarterectomy, as      outlined above, with follow-up Dopplers in 2004.   PLAN:  1.  I have reviewed the situation with the patient, his wife, and his son.      The plan will be to admit the  patient to the hospital on telemetry and      continue to cycle cardiac markers.  2.  Will initiate heparin at this time in addition to home medications in      the absence of lisinopril/HCT.  3.  Gentle hydration with repeat BMET in the morning.  4.  Follow up with 2D echocardiogram as an inpatient to reassess left and      right ventricular function.  5.  Do not plan on proceeding with CT scan of the chest at this particular      time, as pulmonary embolus seems unlikely as the etiology of the      patient's symptoms.  This can obviously be reassessed, and the patient      will in fact already be on heparin.  6.  Dr. Johnsie Cancel to see and evaluate the patient.  At this point, he is      tentatively placed on the cardiac catheterization schedule for tomorrow      pending further review.  7.  Follow up fasting lipid profile.           ______________________________  Satira Sark, M.D. [facility]     SGM/MEDQ  D:  12/26/2004  T:  12/26/2004  Job:  [id number]   cc:   Herbie Baltimore L. Maceo Pro, M.D.  [address] [place]  [place] [address]  Fax: [phone]   Jenkins Rouge, M.D.  [phone] [place]  [place] [address]

## 2010-09-13 NOTE — Discharge Summary (Signed)
[facility]  Patient:    [name], [name]                     MRN: [id number] Adm. Date:  [id number] Disc. Date: 04/25/00 Attending:  Bosie Clos Dictator:   Davis Gourd, P.A.-C. CC:         Briscoe Deutscher, M.D. - [facility]   Discharge Summary  DATE OF BIRTH:  Jun 22, 1928  PROCEDURE:  Persantine Cardiolyte.  HOSPITAL COURSE:  [name] is a 77 year old male with a history of aortocoronary bypass surgery in 1998, who came to the hospital on April 24, 2000, for chest discomfort, that was associated with lightheadedness and general malaise.  He had this pain come on suddenly and was sharp, and was in the midsternal area.  It lasted for a few minutes, but there was residual pressure.  He was pain-free at the time of the examination.  He had IV nitroglycerin and subcutaneous Lovenox started.  He was ruled out for a myocardial infarction, and scheduled for a Persantine Cardiolyte.  The Persantine Cardiolyte was done on April 25, 2000, once his cardiac enzymes were confirmed as negative.  He tolerated the procedure well.  The Cardiolyte showed mild septal hypokinesis, but no scar or ischemia, and an ejection fraction of 63%.  The patient had no further episodes of chest pain, and was considered cardiac stable.  An additional problem was the history of internal carotid artery stenosis. With lightheadedness that was associated with the chest pain, carotid Dopplers were obtained.  Minimal plaque was seen on the right at the endarterectomy site, and on the left he had moderate irregular plaque at the bifurcation of the proximal ICA.  This was 40%-60%.  There was antegrade vertebral flow bilaterally.  It was felt that this could be followed as an outpatient.  An additional problem was the CBG of 236 on admission.  He had q.i.d. CBGs obtained while in the hospital, and the fasting one was 182, and the lowest one was  146.  It was felt that he needed to be on a diabetic diet and be further evaluated by his family physician as an outpatient on this.  DISPOSITION:  The  patient was considered stable for discharge on April 25, 2000, in the p.m.  LABORATORY DATA:  Total cholesterol 148, triglycerides 233, HDL 36, LDL 65.  Chest x-ray:  Status post CABG.  Chronic obstructive pulmonary disease.  No heart failure.  CONDITION ON DISCHARGE:  Stable.  CONSULTATION:  None.  COMPLICATIONS:  None.  DISCHARGE DIAGNOSES: 1. Chest pain, negative myocardial infarction by enzymes.  Negative    ischemic by Cardiolyte. 2. Status post aortocoronary bypass surgery in 1998. 3. Hyperlipidemia. 4. Atherosclerotic peripheral vascular disease, status post right    carotid endarterectomy. 5. Chronic obstructive pulmonary disease. 6. Family history of peripheral vascular disease. 7. Remote history of tobacco use. 8. Hyperglycemia.  PLAN:  Follow up with the family medical doctor.  9. Status post neck surgery.  DISCHARGE INSTRUCTIONS: 1. His activity level is to be as tolerated.  He is to gradually resume    an exercise program. 2. He is to stick to a diet that is low in fat and low in sugar, and    educational materials were given to him prior to his discharge. 3. He is to follow up with Dr. Briscoe Deutscher in [facility], and to call    for an appointment. 4. He  is to follow up with Dr. Collier Salina C. Nishan, and to call for an    appointment.  DISCHARGE MEDICATIONS: 1. Zestril 5 mg q.d. 2. Microzide 12.5 mg q.d. 3. Coated aspirin 325 mg q.d. 4. Lipitor 20 mg q.d. 5. Lopressor 50 mg b.i.d. DD:  04/25/00 TD:  04/25/00 Job: [id number]

## 2011-01-22 ENCOUNTER — Encounter: Payer: Self-pay | Admitting: Cardiovascular Disease

## 2011-01-22 ENCOUNTER — Ambulatory Visit (INDEPENDENT_AMBULATORY_CARE_PROVIDER_SITE_OTHER): Payer: Medicare Other | Admitting: Cardiovascular Disease

## 2011-01-22 DIAGNOSIS — I6529 Occlusion and stenosis of unspecified carotid artery: Secondary | ICD-10-CM

## 2011-01-22 DIAGNOSIS — R0989 Other specified symptoms and signs involving the circulatory and respiratory systems: Secondary | ICD-10-CM

## 2011-01-22 DIAGNOSIS — I739 Peripheral vascular disease, unspecified: Secondary | ICD-10-CM

## 2011-01-22 DIAGNOSIS — I25810 Atherosclerosis of coronary artery bypass graft(s) without angina pectoris: Secondary | ICD-10-CM

## 2011-01-22 NOTE — Progress Notes (Signed)
[name] is seen for F/U of CAD. 3/11 had SSCP and required cath. All of his grafts were patent and his pain has not recurred.  CABG: SVG: IM,OM1,OM2, SVG D1, SVG PDA/PLA and Lima to LAD EF was 45-50% with mild diffuse hypokinesis.  BP is better controlled. Previous edema also improved with diuretic which has now been stopped He sees Dr Burt Knack for PVD. ABI 3/11 .88 on right. Carotid duplex with no significant stenosis S/P right CEA. Has mild claudication in left calf that he walks through. Can have F/U duplex and ABI's when he  I see him in 6 months.  Denies SSCP, palpitaitons, TIA symptoms   ROS: Denies fever, malais, weight loss, blurry vision, decreased visual acuity, cough, sputum, SOB, hemoptysis, pleuritic pain, palpitaitons, heartburn, abdominal pain, melena, lower extremity edema, claudication, or rash.  All other systems reviewed and negative  General: Affect appropriate Healthy:  appears stated age 77: normal Neck supple with no adenopathy JVP normal rigth bruit with previous CEA   no thyromegaly Lungs clear with no wheezing and good diaphragmatic motion Heart:  S1/S2 no murmur,rub, gallop or click PMI normal Abdomen: benighn, BS positve, no tenderness, no AAA no bruit.  No HSM or HJR Distal pulses intact with no bruits No edema Neuro non-focal Skin warm and dry No muscular weakness   Current Outpatient Prescriptions  Medication Sig Dispense Refill  . amLODipine (NORVASC) 5 MG tablet Take 10 mg by mouth daily.        Marland Kitchen aspirin 81 MG tablet Take 81 mg by mouth daily.        . carvedilol (COREG) 12.5 MG tablet Take 12.5 mg by mouth 2 (two) times daily with a meal.        . Cinnamon 500 MG TABS Take by mouth as directed.        . fish oil-omega-3 fatty acids 1000 MG capsule Take 2 g by mouth 2 (two) times daily.        Marland Kitchen lisinopril (PRINIVIL,ZESTRIL) 20 MG tablet Take 20 mg by mouth daily.        . Multiple Vitamins-Minerals (PRESERVISION AREDS PO) Take by mouth 2 (two) times  daily.        . multivitamin (THERAGRAN) per tablet Take 1 tablet by mouth daily.        . nitroGLYCERIN (NITROSTAT) 0.4 MG SL tablet Place 0.4 mg under the tongue every 5 (five) minutes as needed.        . predniSONE (DELTASONE) 1 MG tablet Take 1 mg by mouth. Every other day       . zinc gluconate 50 MG tablet Take 50 mg by mouth daily.          Allergies  Penicillins  Electrocardiogram:  Assessment and Plan

## 2011-01-22 NOTE — Assessment & Plan Note (Signed)
Stable with no angina and good activity level.  Continue medical Rx  

## 2011-01-22 NOTE — Assessment & Plan Note (Signed)
F/U duplex in 6 months mild disease at prevoius CEA site last duplex 2011

## 2011-01-22 NOTE — Patient Instructions (Signed)
Your physician wants you to follow-up in:  [place] ABI'S AND CAROTID SAME DAY You will receive a reminder letter in the mail two months in advance. If you don't receive a letter, please call our office to schedule the follow-up appointment. Your physician recommends that you continue on your current medications as directed. Please refer to the Current Medication list given to you today.  Your physician has requested that you have a carotid duplex. This test is an ultrasound of the carotid arteries in your neck. It looks at blood flow through these arteries that supply the brain with blood. Allow one hour for this exam. There are no restrictions or special instructions. DX BRUIT  IN 6 MONTHS WITH DR Ravanna physician has requested that you have an ankle brachial index (ABI). During this test an ultrasound and blood pressure cuff are used to evaluate the arteries that supply the arms and legs with blood. Allow thirty minutes for this exam. There are no restrictions or special instructions.  IN 6 MONTHS  DX CLAUDICATION

## 2011-01-22 NOTE — Assessment & Plan Note (Signed)
Stable left calf claudication.  Continue silver sneakers, golfing and walking program.  ABI;s in 6 months

## 2011-01-22 NOTE — Assessment & Plan Note (Signed)
Cholesterol is at goal.  Continue current dose of statin and diet Rx.  No myalgias or side effects.  F/U  LFT's in 6 months. Lab Results  Component Value Date   LDLCALC  Value: 51        Total Cholesterol/HDL:CHD Risk Coronary Heart Disease Risk Table                     Men   Women  1/2 Average Risk   3.4   3.3  Average Risk       5.0   4.4  2 X Average Risk   9.6   7.1  3 X Average Risk  23.4   11.0        Use the calculated Patient Ratio above and the CHD Risk Table to determine the patient's CHD Risk.        ATP III CLASSIFICATION (LDL):  <100     mg/dL   Optimal  100-129  mg/dL   Near or Above                    Optimal  130-159  mg/dL   Borderline  160-189  mg/dL   High  >190     mg/dL   Very High 10/24/2008

## 2011-01-22 NOTE — Assessment & Plan Note (Signed)
Well controlled.  Continue current medications and low sodium Dash type diet.    

## 2011-01-27 LAB — BASIC METABOLIC PANEL
BUN: 15
CO2: 21
Calcium: 7.7 — ABNORMAL LOW
Creatinine, Ser: 1.23
GFR calc Af Amer: >60
GFR calc non Af Amer: 57 — ABNORMAL LOW
Glucose, Bld: 150 — ABNORMAL HIGH

## 2011-01-27 LAB — HEMOGLOBIN A1C: Hgb A1c MFr Bld: 5.9

## 2011-01-27 LAB — CBC
Platelets: 190
RDW: 12.9
WBC: 7.9

## 2011-01-27 LAB — POCT I-STAT 4, (NA,K, GLUC, HGB,HCT)
Glucose, Bld: 114 — ABNORMAL HIGH
Hemoglobin: 14.3
Potassium: 5.4 — ABNORMAL HIGH
Sodium: 139

## 2011-01-27 LAB — GLUCOSE, CAPILLARY
Glucose-Capillary: 134 — ABNORMAL HIGH
Glucose-Capillary: 142 — ABNORMAL HIGH

## 2011-01-29 LAB — COMPREHENSIVE METABOLIC PANEL
ALT: 22
AST: 22
Calcium: 9.4
Creatinine, Ser: 1.23
GFR calc Af Amer: >60
GFR calc non Af Amer: 57 — ABNORMAL LOW
Glucose, Bld: 106 — ABNORMAL HIGH
Sodium: 138
Total Protein: 7

## 2011-01-29 LAB — URINALYSIS, ROUTINE W REFLEX MICROSCOPIC
Glucose, UA: NEGATIVE
Ketones, ur: NEGATIVE
Nitrite: NEGATIVE
Protein, ur: NEGATIVE

## 2011-01-29 LAB — TYPE AND SCREEN: ABO/RH(D): A POS

## 2011-01-29 LAB — CBC
MCHC: 34.6
MCV: 89.1
RDW: 13.3

## 2011-01-29 LAB — APTT: aPTT: 32

## 2011-01-29 LAB — ABO/RH: ABO/RH(D): A POS

## 2011-01-29 LAB — PROTIME-INR: Prothrombin Time: 13

## 2011-04-24 ENCOUNTER — Other Ambulatory Visit: Payer: Self-pay | Admitting: Dermatology

## 2011-05-29 DIAGNOSIS — H3530 Unspecified macular degeneration: Secondary | ICD-10-CM | POA: Diagnosis not present

## 2011-05-29 DIAGNOSIS — H40019 Open angle with borderline findings, low risk, unspecified eye: Secondary | ICD-10-CM | POA: Diagnosis not present

## 2011-06-15 ENCOUNTER — Emergency Department (INDEPENDENT_AMBULATORY_CARE_PROVIDER_SITE_OTHER): Payer: Medicare Other

## 2011-06-15 ENCOUNTER — Inpatient Hospital Stay (HOSPITAL_BASED_OUTPATIENT_CLINIC_OR_DEPARTMENT_OTHER)
Admission: EM | Admit: 2011-06-15 | Discharge: 2011-06-16 | DRG: 305 | Disposition: A | Discharge status: Home / Self Care | Payer: Medicare Other | Attending: Cardiology | Admitting: Cardiology

## 2011-06-15 ENCOUNTER — Inpatient Hospital Stay (HOSPITAL_COMMUNITY): Payer: Medicare Other

## 2011-06-15 ENCOUNTER — Other Ambulatory Visit: Payer: Self-pay

## 2011-06-15 ENCOUNTER — Encounter (HOSPITAL_BASED_OUTPATIENT_CLINIC_OR_DEPARTMENT_OTHER): Payer: Self-pay | Admitting: *Deleted

## 2011-06-15 DIAGNOSIS — I10 Essential (primary) hypertension: Principal | ICD-10-CM | POA: Insufficient documentation

## 2011-06-15 DIAGNOSIS — Z7982 Long term (current) use of aspirin: Secondary | ICD-10-CM

## 2011-06-15 DIAGNOSIS — E785 Hyperlipidemia, unspecified: Secondary | ICD-10-CM | POA: Diagnosis not present

## 2011-06-15 DIAGNOSIS — M25519 Pain in unspecified shoulder: Secondary | ICD-10-CM | POA: Diagnosis not present

## 2011-06-15 DIAGNOSIS — Z79899 Other long term (current) drug therapy: Secondary | ICD-10-CM | POA: Diagnosis not present

## 2011-06-15 DIAGNOSIS — I6529 Occlusion and stenosis of unspecified carotid artery: Secondary | ICD-10-CM | POA: Insufficient documentation

## 2011-06-15 DIAGNOSIS — R079 Chest pain, unspecified: Secondary | ICD-10-CM | POA: Diagnosis not present

## 2011-06-15 DIAGNOSIS — R7303 Prediabetes: Secondary | ICD-10-CM | POA: Insufficient documentation

## 2011-06-15 DIAGNOSIS — I739 Peripheral vascular disease, unspecified: Secondary | ICD-10-CM | POA: Insufficient documentation

## 2011-06-15 DIAGNOSIS — I25810 Atherosclerosis of coronary artery bypass graft(s) without angina pectoris: Secondary | ICD-10-CM | POA: Insufficient documentation

## 2011-06-15 DIAGNOSIS — R0789 Other chest pain: Secondary | ICD-10-CM | POA: Diagnosis not present

## 2011-06-15 DIAGNOSIS — IMO0002 Reserved for concepts with insufficient information to code with codable children: Secondary | ICD-10-CM | POA: Diagnosis not present

## 2011-06-15 DIAGNOSIS — R7309 Other abnormal glucose: Secondary | ICD-10-CM | POA: Diagnosis present

## 2011-06-15 DIAGNOSIS — I2510 Atherosclerotic heart disease of native coronary artery without angina pectoris: Secondary | ICD-10-CM | POA: Diagnosis not present

## 2011-06-15 DIAGNOSIS — K7689 Other specified diseases of liver: Secondary | ICD-10-CM | POA: Diagnosis not present

## 2011-06-15 DIAGNOSIS — Z951 Presence of aortocoronary bypass graft: Secondary | ICD-10-CM | POA: Diagnosis not present

## 2011-06-15 DIAGNOSIS — E7800 Pure hypercholesterolemia, unspecified: Secondary | ICD-10-CM | POA: Insufficient documentation

## 2011-06-15 LAB — CBC
HCT: 43.3 % (ref 39.0–52.0)
HCT: 41.5 % (ref 39.0–52.0)
Hemoglobin: 14.9 g/dL (ref 13.0–17.0)
Hemoglobin: 14.6 g/dL (ref 13.0–17.0)
MCH: 29.1 pg (ref 26.0–34.0)
MCHC: 34.4 g/dL (ref 30.0–36.0)
MCV: 84.6 fL (ref 78.0–100.0)
RBC: 4.88 MIL/uL (ref 4.22–5.81)
RDW: 14.9 % (ref 11.5–15.5)
WBC: 8.3 10*3/uL (ref 4.0–10.5)

## 2011-06-15 LAB — BASIC METABOLIC PANEL
BUN: 24 mg/dL — ABNORMAL HIGH (ref 6–23)
Calcium: 9.5 mg/dL (ref 8.4–10.5)
Creatinine, Ser: 1.3 mg/dL (ref 0.50–1.35)
GFR calc Af Amer: 58 mL/min — ABNORMAL LOW (ref >90)
GFR calc non Af Amer: 50 mL/min — ABNORMAL LOW (ref >90)
Glucose, Bld: 99 mg/dL (ref 70–99)

## 2011-06-15 LAB — PROTIME-INR
INR: 0.96 (ref 0.00–1.49)
Prothrombin Time: 13 seconds (ref 11.6–15.2)

## 2011-06-15 LAB — CARDIAC PANEL(CRET KIN+CKTOT+MB+TROPI)
CK, MB: 2.3 ng/mL (ref 0.3–4.0)
Relative Index: INVALID (ref 0.0–2.5)
Troponin I: <0.3 ng/mL (ref <0.30)

## 2011-06-15 LAB — CREATININE, SERUM
Creatinine, Ser: 1.18 mg/dL (ref 0.50–1.35)
GFR calc Af Amer: 65 mL/min — ABNORMAL LOW (ref >90)
GFR calc non Af Amer: 56 mL/min — ABNORMAL LOW (ref >90)

## 2011-06-15 LAB — GLUCOSE, CAPILLARY
Glucose-Capillary: 107 mg/dL — ABNORMAL HIGH (ref 70–99)
Glucose-Capillary: 101 mg/dL — ABNORMAL HIGH (ref 70–99)

## 2011-06-15 MED ORDER — SODIUM CHLORIDE 0.9 % IJ SOLN: 3.0000 mL | INTRAMUSCULAR | Status: DC | PRN

## 2011-06-15 MED ORDER — CARVEDILOL 12.5 MG PO TABS
12.5000 mg | ORAL_TABLET | Freq: Two times a day (BID) | ORAL | Status: DC
  Administered 2011-06-16: 12.5 mg via ORAL
  Filled 2011-06-15 (×4): qty 1

## 2011-06-15 MED ORDER — HYDRALAZINE HCL 20 MG/ML IJ SOLN: 10.0000 mg | Freq: Four times a day (QID) | INTRAMUSCULAR | Status: DC | PRN

## 2011-06-15 MED ORDER — ASPIRIN 300 MG RE SUPP
300.0000 mg | RECTAL | Status: DC
  Filled 2011-06-15: qty 1

## 2011-06-15 MED ORDER — ONDANSETRON HCL 4 MG/2ML IJ SOLN: 4.0000 mg | Freq: Four times a day (QID) | INTRAMUSCULAR | Status: DC | PRN

## 2011-06-15 MED ORDER — NITROGLYCERIN 0.4 MG SL SUBL: 0.4000 mg | SUBLINGUAL_TABLET | SUBLINGUAL | Status: DC | PRN

## 2011-06-15 MED ORDER — ACETAMINOPHEN 325 MG PO TABS: 650.0000 mg | ORAL_TABLET | ORAL | Status: DC | PRN

## 2011-06-15 MED ORDER — SODIUM CHLORIDE 0.9 % IV SOLN: 250.0000 mL | INTRAVENOUS | Status: DC | PRN

## 2011-06-15 MED ORDER — HEPARIN SODIUM (PORCINE) 5000 UNIT/ML IJ SOLN
5000.0000 [IU] | Freq: Three times a day (TID) | INTRAMUSCULAR | Status: DC
  Administered 2011-06-15 – 2011-06-16 (×3): 5000 [IU] via SUBCUTANEOUS
  Filled 2011-06-15 (×5): qty 1

## 2011-06-15 MED ORDER — SODIUM CHLORIDE 0.9 % IJ SOLN
3.0000 mL | Freq: Two times a day (BID) | INTRAMUSCULAR | Status: DC
  Administered 2011-06-15 – 2011-06-16 (×2): 3 mL via INTRAVENOUS

## 2011-06-15 MED ORDER — SIMVASTATIN 20 MG PO TABS
20.0000 mg | ORAL_TABLET | Freq: Every day | ORAL | Status: DC
  Administered 2011-06-15 – 2011-06-16 (×2): 20 mg via ORAL
  Filled 2011-06-15 (×2): qty 1

## 2011-06-15 MED ORDER — ASPIRIN 81 MG PO CHEW: 324.0000 mg | CHEWABLE_TABLET | ORAL | Status: DC

## 2011-06-15 MED ORDER — PREDNISONE 1 MG PO TABS
1.0000 mg | ORAL_TABLET | Freq: Every day | ORAL | Status: DC
  Administered 2011-06-16: 1 mg via ORAL
  Filled 2011-06-15: qty 1

## 2011-06-15 MED ORDER — ASPIRIN EC 325 MG PO TBEC
325.0000 mg | DELAYED_RELEASE_TABLET | Freq: Every day | ORAL | Status: DC
  Administered 2011-06-16: 325 mg via ORAL
  Filled 2011-06-15: qty 1

## 2011-06-15 MED ORDER — MORPHINE SULFATE 2 MG/ML IJ SOLN: 2.0000 mg | INTRAMUSCULAR | Status: DC | PRN

## 2011-06-15 MED ORDER — IOHEXOL 350 MG/ML SOLN
100.0000 mL | Freq: Once | INTRAVENOUS | Status: AC | PRN
  Administered 2011-06-15: 100 mL via INTRAVENOUS

## 2011-06-15 MED ORDER — ASPIRIN 81 MG PO CHEW
324.0000 mg | CHEWABLE_TABLET | Freq: Once | ORAL | Status: AC
  Administered 2011-06-15: 324 mg via ORAL
  Filled 2011-06-15: qty 4

## 2011-06-15 MED ORDER — LISINOPRIL 20 MG PO TABS
20.0000 mg | ORAL_TABLET | Freq: Every day | ORAL | Status: DC
  Administered 2011-06-16: 20 mg via ORAL
  Filled 2011-06-15: qty 1

## 2011-06-15 MED ORDER — AMLODIPINE BESYLATE 10 MG PO TABS
10.0000 mg | ORAL_TABLET | Freq: Every day | ORAL | Status: DC
  Administered 2011-06-16: 10 mg via ORAL
  Filled 2011-06-15: qty 1

## 2011-06-15 NOTE — ED Provider Notes (Signed)
History   Scribed for William Dakin, MD, the patient was seen in [place]. The chart was scribed by Clarisa Fling. The patients care was started at 3:19 PM.   CSN: [id number]  Arrival date & time 06/15/11  1444   First MD Initiated Contact with Patient 06/15/11 1516      Chief Complaint  Patient presents with  . Chest Pain    (Consider location/radiation/quality/duration/timing/severity/associated sxs/prior treatment) HPI [name] is a 78 y.o. male who presents to the Emergency Department complaining of mild substernal chest discomfort onset this afternoon ~3 hours PTA. States that discomfort has subsided since in ED. Pt also notes current pain in left shoulder onset 2-3 days and flushing sensation in face. Notes symptoms developed yesterday after BP levels escalated. Pt reports blood pressure levels were 190/96 early today and then elevated to 201/110. Pt has been prescribed Nitroglycerin but denies taking any meds today for symptoms.  Notes previous bad experience from [facility] previously when he "went out". Pt typically takes ASA nightly and has not taken any today. Pt notes history of bypass surgery in past from chest discomfort that was described like someone sitting on chest but states that symptoms do not feel the same. Last catherization was several years ago and last visit to cardiologist was September of last year. Symptoms are exacerbated by nothing. Last night symptoms were alleviated by stretching arm. Denies any nausea, SOB, or diaphoresis. No hx of heart attack. There are no other associated symptoms and no other alleviating or aggravating factors. Patient had anterior chest pain onset 1 PM today which resolved upon arrival here, without treatment. Continues to have mild left shoulder pain not made better or worse by anything       Cardiologist: Dr. Johnsie Cancel PCP: Dr. Mont Dutton Past Medical History  Diagnosis Date  . Coronary artery disease     post bypass  . Hypertension     . PVD (peripheral vascular disease)   . CVD (cardiovascular disease)   . Hyperlipidemia   . Diverticular disease   . Bigeminal rhythm     Past Surgical History  Procedure Date  . Coronary artery bypass graft 1999  . Carotid endarterectomy     left    Family History  Problem Relation Age of Onset  . Coronary artery disease Other     History  Substance Use Topics  . Smoking status: Former Research scientist (life sciences)  . Smokeless tobacco: Not on file  . Alcohol Use: No      Review of Systems  Constitutional: Negative for diaphoresis.  HENT: Negative.   Respiratory: Negative.  Negative for shortness of breath.   Cardiovascular: Positive for chest pain.       Chest discomfort   Gastrointestinal: Negative for nausea.  Musculoskeletal: Positive for arthralgias.       Left shoulder pain  Skin: Negative.   Neurological: Negative.   Hematological: Negative.   Psychiatric/Behavioral: Negative.   All other systems reviewed and are negative.    Allergies  Penicillins  Home Medications   Current Outpatient Rx  Name Route Sig Dispense Refill  . AMLODIPINE BESYLATE 5 MG PO TABS Oral Take 5 mg by mouth daily.     . ASPIRIN 81 MG PO TABS Oral Take 81 mg by mouth daily.      . ATORVASTATIN CALCIUM 20 MG PO TABS Oral Take 20 mg by mouth at bedtime.    Marland Kitchen CARVEDILOL 12.5 MG PO TABS Oral Take 12.5 mg by mouth 2 (two) times daily  with a meal.      . CINNAMON 500 MG PO TABS Oral Take 2 tablets by mouth every evening.     . CO Q 10 100 MG PO CAPS Oral Take 1 capsule by mouth daily.    . OMEGA-3 FATTY ACIDS 1000 MG PO CAPS Oral Take 1-2 g by mouth 2 (two) times daily. Take 2 capsules in the morning and 1 capsule at night    . LISINOPRIL 20 MG PO TABS Oral Take 20 mg by mouth daily.      . ADULT MULTIVITAMIN W/MINERALS CH Oral Take 1 tablet by mouth daily.    Marland Kitchen PRESERVISION AREDS PO Oral Take 1 tablet by mouth 2 (two) times daily.     Marland Kitchen NITROGLYCERIN 0.4 MG SL SUBL Sublingual Place 0.4 mg under the  tongue every 5 (five) minutes as needed. For chest pain    . PREDNISONE 1 MG PO TABS Oral Take 1 mg by mouth daily.     Marland Kitchen ZINC GLUCONATE 50 MG PO TABS Oral Take 50 mg by mouth daily.        BP 192/87  Pulse 66  Temp(Src) 97.9 F (36.6 C) (Oral)  Resp 20  Ht 5\' 8"  (1.727 m)  Wt 165 lb (74.844 kg)  BMI 25.09 kg/m2  SpO2 98%  Physical Exam  Nursing note and vitals reviewed. Constitutional: He appears well-developed and well-nourished.  HENT:  Head: Normocephalic and atraumatic.  Eyes: Conjunctivae are normal. Pupils are equal, round, and reactive to light.  Neck: Neck supple. No tracheal deviation present. No thyromegaly present.  Cardiovascular: Normal rate and regular rhythm.   No murmur heard. Pulmonary/Chest: Effort normal and breath sounds normal.  Abdominal: Soft. Bowel sounds are normal. He exhibits no distension. There is no tenderness.  Musculoskeletal: Normal range of motion. He exhibits no edema and no tenderness.  Neurological: He is alert. Coordination normal.  Skin: Skin is warm and dry. No rash noted.  Psychiatric: He has a normal mood and affect.    ED Course  Procedures (including critical care time)  4:25 PM patient still without chest pain Spoke with Dr.Mclean, will transfer to [facility]  telemetry unit  Labs Reviewed  CBC  BASIC METABOLIC PANEL  TROPONIN I   No results found.   No diagnosis found.  DIAGNOSTIC STUDIES: Oxygen Saturation is 98% on room air, normal by my interpretation.     Date: 06/15/2011  Rate: 70  Rhythm: normal sinus rhythm  QRS Axis: normal  Intervals: normal  ST/T Wave abnormalities: normal and nonspecific T wave changes  Conduction Disutrbances:none  Narrative Interpretation:   Old EKG Reviewed: unchanged No significant change from 10/24/2008  LABS Results for orders placed during the hospital encounter of 06/15/11  CBC      Component Value Range   WBC 9.1  4.0 - 10.5 (K/uL)   RBC 5.12  4.22 - 5.81  (MIL/uL)   Hemoglobin 14.9  13.0 - 17.0 (g/dL)   HCT 43.3  39.0 - 52.0 (%)   MCV 84.6  78.0 - 100.0 (fL)   MCH 29.1  26.0 - 34.0 (pg)   MCHC 34.4  30.0 - 36.0 (g/dL)   RDW 14.9  11.5 - 15.5 (%)   Platelets 231  150 - 400 (K/uL)  BASIC METABOLIC PANEL      Component Value Range   Sodium 138  135 - 145 (mEq/L)   Potassium 4.7  3.5 - 5.1 (mEq/L)   Chloride 102  96 - 112 (  mEq/L)   CO2 25  19 - 32 (mEq/L)   Glucose, Bld 99  70 - 99 (mg/dL)   BUN 24 (*) 6 - 23 (mg/dL)   Creatinine, Ser 1.30  0.50 - 1.35 (mg/dL)   Calcium 9.5  8.4 - 10.5 (mg/dL)   GFR calc non Af Amer 50 (*) >90 (mL/min)   GFR calc Af Amer 58 (*) >90 (mL/min)  TROPONIN I      Component Value Range   Troponin I <0.30  <0.30 (ng/mL)     COORDINATION OF CARE:   3:19pm:  - Patient evaluated by ED physician, ASA DG Chest, CBC, BMP, Troponin, EKG  ordered  MDM  Unstable Angina  possibility given patient's past history of coronary disease Patient stable for transfer. Plan admit telemetry. Transfer via [place] ambulance Diagnosis chest pain   I personally performed the services described in this documentation, which was scribed in my presence. The recorded information has been reviewed and considered.       William Dakin, MD 06/15/11 [phone]

## 2011-06-15 NOTE — ED Notes (Signed)
Pt states he has had left shoulder discomfort x 2 days. Today BP was up and he has had some chest discomfort.  No other s/s. Hx CABG

## 2011-06-15 NOTE — H&P (Signed)
Primary Cardiologist: Johnsie Cancel  CC: Back pain, left shoulder pain, and uncontrolled systolic hypertension  History of Present Illness: [name] is a 78 y.o. male with a past medical history significant for CAD s/p CABG 1999, PVD s/p right CEA, HTN, HLD who presents for evaluation of back pain, left shoulder pain, and elevated blood pressure.  His last cath was in 2010 which showed native 3V CAD with patent grafts (LIMA-LAD, SVG-D1, SVG-RI-OM1-OM2, SVG-PDA-PLA) and he has had no angina since then.  He reports a few days of back pain and left shoulder pain that has been fairly constant.  He felt flushed today and took his BP and it was 196/100. He reports that his BP typically runs around 130/80.  Because of the elevated BP, he came to the ED for evaluation.  This pain is different from his prior angina.  He denies any other complaints, and denies CHF symptoms, or syncope.   In the ED, he got ASA and was transferred to [facility].  Currently, he continues to have mild shoulder pain, but he denies chest pain.    PMH: 1. CAD s/p CABG (see HPI for details) 2. PVD s/p right CEA 3. HTN 4. HLD 5. Diet controlled Diabetes  SOCIAL HISTORY: The patient denies tobacco, alcohol or drug use.   FAMILY HISTORY: There is no family history of aortic disease.   REVIEW OF SYSTEMS: All systems reviewed and are negative except as mentioned above in the HPI.  Of note, he is on a steroid taper for diarrhea.    MEDICATIONS: No current facility-administered medications on file prior to encounter.   Current Outpatient Prescriptions on File Prior to Encounter  Medication Sig Dispense Refill  . amLODipine (NORVASC) 5 MG tablet Take 5 mg by mouth daily.       Marland Kitchen aspirin 81 MG tablet Take 81 mg by mouth daily.        . carvedilol (COREG) 12.5 MG tablet Take 12.5 mg by mouth 2 (two) times daily with a meal.        . Cinnamon 500 MG TABS Take 2 tablets by mouth every evening.       . fish oil-omega-3 fatty acids  1000 MG capsule Take 1-2 g by mouth 2 (two) times daily. Take 2 capsules in the morning and 1 capsule at night      . lisinopril (PRINIVIL,ZESTRIL) 20 MG tablet Take 20 mg by mouth daily.        . Multiple Vitamins-Minerals (PRESERVISION AREDS PO) Take 1 tablet by mouth 2 (two) times daily.       . nitroGLYCERIN (NITROSTAT) 0.4 MG SL tablet Place 0.4 mg under the tongue every 5 (five) minutes as needed. For chest pain      . predniSONE (DELTASONE) 1 MG tablet Take 1 mg by mouth daily.       Marland Kitchen zinc gluconate 50 MG tablet Take 50 mg by mouth daily.           ALLERGY: Allergies  Allergen Reactions  . Penicillins Hives     PHYSICAL EXAMINATION: Filed Vitals:   06/15/11 1830  BP: 195/71  Pulse: 82  Temp:   Resp: 21  GENERAL: well developed, well nourished, no acute distress EYES: PERRL, EOMI ENT: Oropharynx is clear.  Dentition is within normal limits NECK:  There is no JVD, or carotid bruits. No masses HEART: RRR with no murmurs, rubs, or gallops LUNGS: Clear to auscultation bilaterally ABDOMEN:  +BS, soft, NTND EXTREMITIES:  No  clubbing, cyanosis, or edema. PULSES:  Femoral pulses are 2+ bilaterally without bruits.  1+ DP on left, 2+ on the right.  NEUROLOGIC: AOx3, no focal deficits SKIN: No rashes PSYCH:  Normal judgment and insight, mood is appropriate.  EKG: 1: OSH (1454) - NSR 70, first degree AVB, no ischemia; limb lead reversal 2: [place] (1823) - NSR 73, PACs, no ischemia.  Results for orders placed during the hospital encounter of 06/15/11 (from the past 24 hour(s))  CBC     Status: Normal   Collection Time   06/15/11  2:55 PM      Component Value Range   WBC 9.1  4.0 - 10.5 (K/uL)   RBC 5.12  4.22 - 5.81 (MIL/uL)   Hemoglobin 14.9  13.0 - 17.0 (g/dL)   HCT 43.3  39.0 - 52.0 (%)   MCV 84.6  78.0 - 100.0 (fL)   MCH 29.1  26.0 - 34.0 (pg)   MCHC 34.4  30.0 - 36.0 (g/dL)   RDW 14.9  11.5 - 15.5 (%)   Platelets 231  150 - 400 (K/uL)  BASIC METABOLIC PANEL     Status:  Abnormal   Collection Time   06/15/11  2:55 PM      Component Value Range   Sodium 138  135 - 145 (mEq/L)   Potassium 4.7  3.5 - 5.1 (mEq/L)   Chloride 102  96 - 112 (mEq/L)   CO2 25  19 - 32 (mEq/L)   Glucose, Bld 99  70 - 99 (mg/dL)   BUN 24 (*) 6 - 23 (mg/dL)   Creatinine, Ser 1.30  0.50 - 1.35 (mg/dL)   Calcium 9.5  8.4 - 10.5 (mg/dL)   GFR calc non Af Amer 50 (*) >90 (mL/min)   GFR calc Af Amer 58 (*) >90 (mL/min)  TROPONIN I     Status: Normal   Collection Time   06/15/11  2:55 PM      Component Value Range   Troponin I <0.30  <0.30 (ng/mL)  GLUCOSE, CAPILLARY     Status: Abnormal   Collection Time   06/15/11  6:33 PM      Component Value Range   Glucose-Capillary 107 (*) 70 - 99 (mg/dL)   Dg Chest 2 View  06/15/2011  *RADIOLOGY REPORT*  Clinical Data: Chest pain.  CHEST - 2 VIEW  Comparison: 10/24/2008.  Findings:  Cardiopericardial silhouette within normal limits. Mediastinal contours normal. Trachea midline.  No airspace disease or effusion.  CABG. Monitoring leads are projected over the chest.  IMPRESSION: No active cardiopulmonary disease.  No interval change.  Original Report Authenticated By: Dereck Ligas, M.D.    ASSESSMENT AND PLAN: 45 YOWM with a past medical history significant for CAD s/p CABG 1999, PVD s/p right CEA, HTN, HLD who presents for evaluation of back pain and left shoulder pain and is found to have and elevated blood pressure.    1: Admit to 2900 - [facility]  2: Back pain and left shoulder pain - most likely this is musculoskeletal in etiology; however, this could be referred pain from his systolic hypertension; differential also includes aortic dissection.  This is unlikely to be an acute coronary syncope.  While it is unlikely to be an aortic dissection, I feel that he would benefit from a CTA to rule this out (especially in light of his vascular disease, back pain, and systolic hypertension).  We will cycle cardiac enzymes to rule out MI.  We will  continue his home  medications and treat his shoulder pain with prn morphine.    3: Uncontrolled HTN - certainly this could be accounting for his pain.  Recommend checking renal artery duplex in the morning since his BP has previously been under good control.  Increase norvasc to 10mg  daily.  If his renal artery duplex is negative, recommend increasing his lisinopril too.   4: DM - diet controlled, check A1c.  5: HLD - chest FLP, continue lipitor.   6: FEN: SLIVF, Electrolytes are stable, NPO after midnight.    7: DVT prophylaxis: sq heparin.  Shaune Pollack. Radford Pax, MD Level 3 Admission

## 2011-06-15 NOTE — ED Notes (Signed)
Pt with active shoulder pain in L shoulder, Dr Winfred Leeds declined to order any medication for pain.  Regulatory affairs officer on [place], [place] who stated that she had spoken with Dr Aundra Dubin and he wanted pt to go to stepdown unit since he has active pain.  Bed assignment to be changed by bed placement.

## 2011-06-16 DIAGNOSIS — R079 Chest pain, unspecified: Secondary | ICD-10-CM | POA: Insufficient documentation

## 2011-06-16 DIAGNOSIS — K551 Chronic vascular disorders of intestine: Secondary | ICD-10-CM | POA: Diagnosis not present

## 2011-06-16 DIAGNOSIS — I2510 Atherosclerotic heart disease of native coronary artery without angina pectoris: Secondary | ICD-10-CM | POA: Diagnosis not present

## 2011-06-16 DIAGNOSIS — E785 Hyperlipidemia, unspecified: Secondary | ICD-10-CM | POA: Diagnosis not present

## 2011-06-16 DIAGNOSIS — Z951 Presence of aortocoronary bypass graft: Secondary | ICD-10-CM | POA: Diagnosis not present

## 2011-06-16 DIAGNOSIS — Z79899 Other long term (current) drug therapy: Secondary | ICD-10-CM | POA: Diagnosis not present

## 2011-06-16 DIAGNOSIS — I10 Essential (primary) hypertension: Secondary | ICD-10-CM | POA: Diagnosis not present

## 2011-06-16 DIAGNOSIS — I739 Peripheral vascular disease, unspecified: Secondary | ICD-10-CM | POA: Diagnosis not present

## 2011-06-16 LAB — CARDIAC PANEL(CRET KIN+CKTOT+MB+TROPI)
CK, MB: 2.3 ng/mL (ref 0.3–4.0)
Troponin I: <0.3 ng/mL (ref <0.30)

## 2011-06-16 LAB — LIPID PANEL
LDL Cholesterol: 62 mg/dL (ref 0–99)
VLDL: 35 mg/dL (ref 0–40)

## 2011-06-16 LAB — HEMOGLOBIN A1C: Hgb A1c MFr Bld: 6.3 % — ABNORMAL HIGH (ref <5.7)

## 2011-06-16 MED ORDER — AMLODIPINE BESYLATE 10 MG PO TABS: 10.0000 mg | ORAL_TABLET | Freq: Every day | ORAL | Status: DC

## 2011-06-16 NOTE — Plan of Care (Signed)
Problem: Phase II Progression Outcomes Goal: If positive for MI, change to MI Path Outcome: Adequate for Discharge Patient being discharged today

## 2011-06-16 NOTE — Discharge Summary (Signed)
Patient ID: [name],  MRN: [id number], DOB/AGE: 76/12/1928 76 y.o.  Admit date: 06/15/2011 Discharge date: 06/16/2011  Primary Care Provider: No primary provider on file. Primary Cardiologist: P. Nishan  Discharge Diagnoses Principal Problem:  *HYPERTENSION Active Problems:  HYPERCHOLESTEROLEMIA  IIA  CAD, ARTERY BYPASS GRAFT  CAROTID ARTERY DISEASE  PVD  DIABETES MELLITUS, BORDERLINE   Allergies Allergies  Allergen Reactions  . Penicillins Hives    Procedures  06/15/2011 - CTA Chest  IMPRESSION:  1. No pulmonary emboli or acute abnormality. 2. Mild hepatic steatosis. ______________________________________  06/16/2011 - Renal Artery Duplex  No evidence of renal artery stenosis bilaterally. Normal intrarenal RI. 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 Right kidney measures 11.2cm and the Left kidney measures 10.7cm. Aorta= Proximal: 1.49cm, Mid: 1.8cm, Distal: 1.69cm. ________________________________________________  History of Present Illness  78 y/o male with the above problem list who was in his USOH until a few days prior to admission when he began to experience back and left shoulder pain - dissimilar to prior angina - which was relatively constant in nature.  On the day of admission, pt felt somewhat flushed and checked his BP and noted it to be elevated at 196/100.  Due to symptoms and hypertension, pt presented to the [facility] ED for further eval.  There, he remained hypertensive with mild left shoulder pain.  He had no chest pain and ECG was not acutely changed.  Hospital Course  Following admission, CTA of the chest was performed and was negative for pulmonary embolism or other acute abnormality.  He has continued to have mild left shoulder pain however his cardiac markers remain negative.  With titration of his Norvasc, to 10mg  daily, is BP has improved.  Renal artery duplex was performed and  showed no evidence of renal artery stenosis (though > 70% stenosis of proximal SMA was noted).  In the absence of objective evidence of ischemia, he will be discharged home today in good condition.  He has f/u with Dr. Johnsie Cancel in a few weeks.  Discharge Vitals Blood pressure 155/64, pulse 72, temperature 97.9 F (36.6 C), temperature source Oral, resp. rate 18, height 5\' 8"  (1.727 m), weight 163 lb 2.3 oz (74 kg), SpO2 93.00%.  Filed Weights   06/15/11 1455 06/15/11 1800 06/16/11 0600  Weight: 165 lb (74.844 kg) 161 lb 13.1 oz (73.4 kg) 163 lb 2.3 oz (74 kg)    Labs  CBC  Basename 06/15/11 2100 06/15/11 1455  WBC 8.3 9.1  NEUTROABS -- --  HGB 14.6 14.9  HCT 41.5 43.3  MCV 85.0 84.6  PLT 207 [id number]   Basic Metabolic Panel  Basename [id number] 2100 06/15/11 1455  NA -- 138  K -- 4.7  CL -- 102  CO2 -- 25  GLUCOSE -- 99  BUN -- 24*  CREATININE 1.18 1.30  CALCIUM -- 9.5  MG -- --  PHOS -- --   Cardiac Enzymes  Basename 06/16/11 1027 06/16/11 0135 06/15/11 2100  CKTOTAL 78 75 63  CKMB 2.3 2.3 2.3  CKMBINDEX -- -- --  TROPONINI <0.30 <0.30 <0.30   Hemoglobin A1C  Basename 06/15/11 2100  HGBA1C 6.3*   Fasting Lipid Panel  Basename 06/16/11 0135  CHOL 145  HDL 48  LDLCALC 62  TRIG 175*  CHOLHDL 3.0  LDLDIRECT --   Disposition  Pt is being discharged home today in good condition.  Follow-up Plans & Appointments  Follow-up Information  Follow up with Jenkins Rouge, MD on 07/11/2011. (11:15 AM)    Contact information:   [address], [place] [facility] [facility] [phone]          Discharge Medications  Medication List  As of 06/16/2011  3:18 PM      TAKE these medications         amLODipine 10 MG tablet   Commonly known as: NORVASC   Take 1 tablet (10 mg total) by mouth daily.      aspirin 81 MG tablet   Take 81 mg by mouth daily.      atorvastatin 20 MG tablet   Commonly known as: LIPITOR   Take  20 mg by mouth at bedtime.      carvedilol 12.5 MG tablet   Commonly known as: COREG   Take 12.5 mg by mouth 2 (two) times daily with a meal.      Cinnamon 500 MG Tabs   Take 2 tablets by mouth every evening.      Co Q 10 100 MG Caps   Take 1 capsule by mouth daily.      fish oil-omega-3 fatty acids 1000 MG capsule   Take 1-2 g by mouth 2 (two) times daily. Take 2 capsules in the morning and 1 capsule at night      lisinopril 20 MG tablet   Commonly known as: PRINIVIL,ZESTRIL   Take 20 mg by mouth daily.      mulitivitamin with minerals Tabs   Take 1 tablet by mouth daily.      nitroGLYCERIN 0.4 MG SL tablet   Commonly known as: NITROSTAT   Place 0.4 mg under the tongue every 5 (five) minutes as needed. For chest pain      predniSONE 1 MG tablet   Commonly known as: DELTASONE   Take 1 mg by mouth daily.      PRESERVISION AREDS PO   Take 1 tablet by mouth 2 (two) times daily.      zinc gluconate 50 MG tablet   Take 50 mg by mouth daily.            Outstanding Labs/Studies  None  Duration of Discharge Encounter   Greater than 30 minutes including physician time.  Signed, Murray Hodgkins NP 06/16/2011, 3:29 PM   Attending Note:   The patient was seen and examined.  Agree with assessment and plan as noted above.  Please see my note from earlier today.  Pt is stable to go home  Thayer Headings, Brooke Bonito., MD, [facility] 06/16/2011, 5:17 PM

## 2011-06-16 NOTE — Progress Notes (Signed)
Subjective:   Pt is an 78 yo with hx of CAD. Admitted with cp / shoulder pain .  The pain is atypical and is clearly different from his angina pain.  Enzymes are negative. CT angio is negative for PE      . amLODipine  10 mg Oral Daily  . aspirin  324 mg Oral Once  . aspirin  324 mg Oral NOW   Or  . aspirin  300 mg Rectal NOW  . aspirin EC  325 mg Oral Daily  . carvedilol  12.5 mg Oral BID WC  . heparin  5,000 Units Subcutaneous Q8H  . lisinopril  20 mg Oral Daily  . predniSONE  1 mg Oral Daily  . simvastatin  20 mg Oral Daily  . sodium chloride  3 mL Intravenous Q12H      Objective:  Vital Signs in the last 24 hours: Blood pressure 137/61, pulse 71, temperature 98 F (36.7 C), temperature source Oral, resp. rate 16, height 5\' 8"  (1.727 m), weight 161 lb 13.1 oz (73.4 kg), SpO2 97.00%. Temp:  [97.9 F (36.6 C)-98.2 F (36.8 C)] 98 F (36.7 C) (02/17 2000) Pulse Rate:  [66-89] 71  (02/18 0000) Resp:  [12-21] 16  (02/18 0000) BP: (133-214)/(61-87) 137/61 mmHg (02/18 0000) SpO2:  [97 %-100 %] 97 % (02/18 0000) Weight:  [161 lb 13.1 oz (73.4 kg)-165 lb (74.844 kg)] 161 lb 13.1 oz (73.4 kg) (02/17 1800)  Intake/Output from previous day: 02/17 0701 - 02/18 0700 In: 120 [P.O.:120] Out: 300 [Urine:300] Intake/Output from this shift:    Physical Exam:  Physical Exam: Blood pressure 137/61, pulse 71, temperature 98 F (36.7 C), temperature source Oral, resp. rate 16, height 5\' 8"  (1.727 m), weight 161 lb 13.1 oz (73.4 kg), SpO2 97.00%. General: Well developed, well nourished, in no acute distress. Head: Normocephalic, atraumatic, sclera non-icteric, mucus membranes are moist,  Neck: Supple. Negative for carotid bruits. JVD not elevated. Lungs: Clear bilaterally to auscultation without wheezes, rales, or rhonchi. Breathing is unlabored. Heart: RRR with S1 S2. No murmurs, rubs, or gallops appreciated. Abdomen: Soft, non-tender, non-distended with normoactive bowel  sounds. No hepatomegaly. No rebound/guarding. No obvious abdominal masses. Msk:  Strength and tone appear normal for age. Extremities: No clubbing or cyanosis. No edema.  Distal pedal pulses are 2+ and equal bilaterally. Neuro: Alert and oriented X 3. Moves all extremities spontaneously. Psych:  Responds to questions appropriately with a normal affect.    Lab Results:   [facility] 06/15/11 2100 06/15/11 1455  NA -- 138  K -- 4.7  CL -- 102  CO2 -- 25  GLUCOSE -- 99  BUN -- 24*  CREATININE 1.18 1.30  CALCIUM -- 9.5  MG -- --  PHOS -- --   No results found for this basename: AST:2,ALT:2,ALKPHOS:2,BILITOT:2,PROT:2,ALBUMIN:2 in the last 72 hours No results found for this basename: LIPASE:2,AMYLASE:2 in the last 72 hours  Basename 06/15/11 2100 06/15/11 1455  WBC 8.3 9.1  NEUTROABS -- --  HGB 14.6 14.9  HCT 41.5 43.3  MCV 85.0 84.6  PLT 207 231    Basename 06/16/11 0135 06/15/11 2100 06/15/11 1455  CKTOTAL 75 63 --  CKMB 2.3 2.3 --  TROPONINI <0.30 <0.30 <0.30   No components found with this basename: POCBNP:3 No results found for this basename: DDIMER in the last 72 hours No results found for this basename: HGBA1C in the last 72 hours  Basename 06/16/11 0135  CHOL 145  HDL 48  LDLCALC  62  TRIG 175*  CHOLHDL 3.0   No results found for this basename: TSH,T4TOTAL,FREET3,T3FREE,THYROIDAB in the last 72 hours No results found for this basename: VITAMINB12,FOLATE,FERRITIN,TIBC,IRON,RETICCTPCT in the last 72 hours   Tele: NSR.  Assessment/Plan:   1.  Chest pain:  Atypical, enzymes are negative. CT angio is negative.  Home today on same meds.  To see Dr. Johnsie Cancel in several weeks.   Disposition: home today  Ramond Dial., MD, [facility] 06/16/2011, 7:45 AM LOS: Day 1

## 2011-06-26 DIAGNOSIS — I10 Essential (primary) hypertension: Secondary | ICD-10-CM | POA: Diagnosis not present

## 2011-07-11 ENCOUNTER — Ambulatory Visit (INDEPENDENT_AMBULATORY_CARE_PROVIDER_SITE_OTHER): Payer: Medicare Other | Admitting: Cardiovascular Disease

## 2011-07-11 ENCOUNTER — Encounter: Payer: Self-pay | Admitting: Cardiovascular Disease

## 2011-07-11 VITALS — BP 134/68 | HR 67 | Wt 167.8 lb

## 2011-07-11 DIAGNOSIS — R0989 Other specified symptoms and signs involving the circulatory and respiratory systems: Secondary | ICD-10-CM | POA: Diagnosis not present

## 2011-07-11 DIAGNOSIS — R52 Pain, unspecified: Secondary | ICD-10-CM

## 2011-07-11 NOTE — Patient Instructions (Addendum)
Your physician wants you to follow-up in:  [place] will receive a reminder letter in the mail two months in advance. If you don't receive a letter, please call our office to schedule the follow-up appointment. Your physician recommends that you continue on your current medications as directed. Please refer to the Current Medication list given to you today. Your physician has requested that you have a carotid duplex. This test is an ultrasound of the carotid arteries in your neck. It looks at blood flow through these arteries that supply the brain with blood. Allow one hour for this exam. There are no restrictions or special instructions. DUE September 2013 Your physician has requested that you have an ankle brachial index (ABI). During this test an ultrasound and blood pressure cuff are used to evaluate the arteries that supply the arms and legs with blood. Allow thirty minutes for this exam. There are no restrictions or special instructions. LEG PAIN IN September 2013

## 2011-07-11 NOTE — Assessment & Plan Note (Signed)
Stable mild claudication in LLE  ABI"s in September

## 2011-07-11 NOTE — Assessment & Plan Note (Signed)
Cholesterol is at goal.  Continue current dose of statin and diet Rx.  No myalgias or side effects.  F/U  LFT's in 6 months. Lab Results  Component Value Date   LDLCALC 62 06/16/2011

## 2011-07-11 NOTE — Progress Notes (Signed)
[name] is seen for F/U of CAD. 3/11 had SSCP and required cath. All of his grafts were patent and his pain has not recurred.  CABG: SVG: IM,OM1,OM2, SVG D1, SVG PDA/PLA and [place] to LAD EF was 45-50% with mild diffuse hypokinesis.  Recently hospitalized for vague symptoms and elevated BP.  R/O and BP improved on current regiman  Previous edema also improved with diuretic which has now been stopped He sees Dr Burt Knack for PVD. ABI 3/11 .88 on right. Carotid duplex with no significant stenosis S/P right CEA. Has mild claudication in left calf that he walks.  Will have F/U duplex and ABI's  In September  ROS: Denies fever, malais, weight loss, blurry vision, decreased visual acuity, cough, sputum, SOB, hemoptysis, pleuritic pain, palpitaitons, heartburn, abdominal pain, melena, lower extremity edema, claudication, or rash.  All other systems reviewed and negative  General: Affect appropriate Healthy:  appears stated age 78: normal Neck supple with no adenopathy JVP normal right bruits prev RCEA  no thyromegaly Lungs clear with no wheezing and good diaphragmatic motion Heart:  S1/S2 no murmur, no rub, gallop or click PMI normal Abdomen: benighn, BS positve, no tenderness, no AAA no bruit.  No HSM or HJR Distal pulses intact with no bruits No edema Neuro non-focal Skin warm and dry No muscular weakness   Current Outpatient Prescriptions  Medication Sig Dispense Refill  . amLODipine (NORVASC) 10 MG tablet Take 1 tablet (10 mg total) by mouth daily.  30 tablet  6  . aspirin 81 MG tablet Take 81 mg by mouth daily.        Marland Kitchen atorvastatin (LIPITOR) 20 MG tablet Take 20 mg by mouth at bedtime.      . bifidobacterium infantis (ALIGN) capsule Take 1 capsule by mouth daily.      . carvedilol (COREG) 12.5 MG tablet Take 12.5 mg by mouth 2 (two) times daily with a meal.        . Cinnamon 500 MG TABS Take 2 tablets by mouth every evening.       . Coenzyme Q10 (CO Q 10) 100 MG CAPS Take 1 capsule by  mouth daily.      . fish oil-omega-3 fatty acids 1000 MG capsule Take 1-2 g by mouth 2 (two) times daily. Take 2 capsules in the morning and 1 capsule at night      . lisinopril (PRINIVIL,ZESTRIL) 20 MG tablet Take 20 mg by mouth daily.        . Multiple Vitamin (MULITIVITAMIN WITH MINERALS) TABS Take 1 tablet by mouth daily.      . Multiple Vitamins-Minerals (PRESERVISION AREDS PO) Take 1 tablet by mouth 2 (two) times daily.       . nitroGLYCERIN (NITROSTAT) 0.4 MG SL tablet Place 0.4 mg under the tongue every 5 (five) minutes as needed. For chest pain      . zinc gluconate 50 MG tablet Take 50 mg by mouth daily.          Allergies  Penicillins  Electrocardiogram:  Assessment and Plan

## 2011-07-11 NOTE — Assessment & Plan Note (Signed)
Stable with no angina and good activity level.  Continue medical Rx  

## 2011-07-11 NOTE — Assessment & Plan Note (Signed)
F/U duplex in September.  2011 CEA wide open.  ASA

## 2011-07-11 NOTE — Assessment & Plan Note (Signed)
Improved.  I think the spike in hospital was caused by pain from pinched nerve in neck

## 2011-07-14 DIAGNOSIS — R109 Unspecified abdominal pain: Secondary | ICD-10-CM | POA: Diagnosis not present

## 2011-08-08 DIAGNOSIS — M79609 Pain in unspecified limb: Secondary | ICD-10-CM | POA: Diagnosis not present

## 2011-08-18 DIAGNOSIS — M1A00X1 Idiopathic chronic gout, unspecified site, with tophus (tophi): Secondary | ICD-10-CM | POA: Diagnosis not present

## 2011-08-26 DIAGNOSIS — K5289 Other specified noninfective gastroenteritis and colitis: Secondary | ICD-10-CM | POA: Diagnosis not present

## 2011-09-05 DIAGNOSIS — M79609 Pain in unspecified limb: Secondary | ICD-10-CM | POA: Diagnosis not present

## 2011-09-05 DIAGNOSIS — M898X9 Other specified disorders of bone, unspecified site: Secondary | ICD-10-CM | POA: Diagnosis not present

## 2011-09-05 DIAGNOSIS — M659 Synovitis and tenosynovitis, unspecified: Secondary | ICD-10-CM | POA: Diagnosis not present

## 2011-09-05 DIAGNOSIS — M7150 Other bursitis, not elsewhere classified, unspecified site: Secondary | ICD-10-CM | POA: Diagnosis not present

## 2011-09-05 DIAGNOSIS — M65979 Unspecified synovitis and tenosynovitis, unspecified ankle and foot: Secondary | ICD-10-CM | POA: Diagnosis not present

## 2011-09-12 DIAGNOSIS — M7750 Other enthesopathy of unspecified foot: Secondary | ICD-10-CM | POA: Diagnosis not present

## 2011-10-08 DIAGNOSIS — M1A00X1 Idiopathic chronic gout, unspecified site, with tophus (tophi): Secondary | ICD-10-CM | POA: Diagnosis not present

## 2011-10-14 DIAGNOSIS — Z125 Encounter for screening for malignant neoplasm of prostate: Secondary | ICD-10-CM | POA: Diagnosis not present

## 2011-10-14 DIAGNOSIS — R3989 Other symptoms and signs involving the genitourinary system: Secondary | ICD-10-CM | POA: Diagnosis not present

## 2011-10-14 DIAGNOSIS — R3915 Urgency of urination: Secondary | ICD-10-CM | POA: Diagnosis not present

## 2011-10-14 DIAGNOSIS — M1A00X1 Idiopathic chronic gout, unspecified site, with tophus (tophi): Secondary | ICD-10-CM | POA: Diagnosis not present

## 2011-10-17 DIAGNOSIS — N410 Acute prostatitis: Secondary | ICD-10-CM | POA: Diagnosis not present

## 2011-10-17 DIAGNOSIS — R972 Elevated prostate specific antigen [PSA]: Secondary | ICD-10-CM | POA: Diagnosis not present

## 2011-10-23 DIAGNOSIS — L821 Other seborrheic keratosis: Secondary | ICD-10-CM | POA: Diagnosis not present

## 2011-10-23 DIAGNOSIS — Z85828 Personal history of other malignant neoplasm of skin: Secondary | ICD-10-CM | POA: Diagnosis not present

## 2011-10-23 DIAGNOSIS — L570 Actinic keratosis: Secondary | ICD-10-CM | POA: Diagnosis not present

## 2011-11-04 DIAGNOSIS — L989 Disorder of the skin and subcutaneous tissue, unspecified: Secondary | ICD-10-CM | POA: Diagnosis not present

## 2011-11-04 DIAGNOSIS — I10 Essential (primary) hypertension: Secondary | ICD-10-CM | POA: Diagnosis not present

## 2011-11-04 DIAGNOSIS — K4090 Unilateral inguinal hernia, without obstruction or gangrene, not specified as recurrent: Secondary | ICD-10-CM | POA: Diagnosis not present

## 2011-11-04 DIAGNOSIS — M1A00X1 Idiopathic chronic gout, unspecified site, with tophus (tophi): Secondary | ICD-10-CM | POA: Diagnosis not present

## 2011-11-14 DIAGNOSIS — R972 Elevated prostate specific antigen [PSA]: Secondary | ICD-10-CM | POA: Diagnosis not present

## 2011-11-23 ENCOUNTER — Emergency Department (HOSPITAL_BASED_OUTPATIENT_CLINIC_OR_DEPARTMENT_OTHER)
Admission: EM | Admit: 2011-11-23 | Discharge: 2011-11-23 | Disposition: A | Discharge status: Home / Self Care | Payer: Medicare Other | Attending: Emergency Medicine | Admitting: Emergency Medicine

## 2011-11-23 ENCOUNTER — Encounter (HOSPITAL_BASED_OUTPATIENT_CLINIC_OR_DEPARTMENT_OTHER): Payer: Self-pay | Admitting: Emergency Medicine

## 2011-11-23 DIAGNOSIS — Z951 Presence of aortocoronary bypass graft: Secondary | ICD-10-CM | POA: Insufficient documentation

## 2011-11-23 DIAGNOSIS — E785 Hyperlipidemia, unspecified: Secondary | ICD-10-CM | POA: Insufficient documentation

## 2011-11-23 DIAGNOSIS — I10 Essential (primary) hypertension: Secondary | ICD-10-CM | POA: Insufficient documentation

## 2011-11-23 DIAGNOSIS — I2510 Atherosclerotic heart disease of native coronary artery without angina pectoris: Secondary | ICD-10-CM | POA: Diagnosis not present

## 2011-11-23 DIAGNOSIS — Z87891 Personal history of nicotine dependence: Secondary | ICD-10-CM | POA: Diagnosis not present

## 2011-11-23 DIAGNOSIS — K4090 Unilateral inguinal hernia, without obstruction or gangrene, not specified as recurrent: Secondary | ICD-10-CM

## 2011-11-23 HISTORY — DX: Unspecified abdominal hernia without obstruction or gangrene: K46.9

## 2011-11-23 MED ORDER — HYDROCODONE-ACETAMINOPHEN 5-325 MG PO TABS: ORAL_TABLET | ORAL | Status: DC

## 2011-11-23 NOTE — ED Provider Notes (Signed)
History     CSN: [id number]  Arrival date & time 11/23/11  [id number]   First MD Initiated Contact with Patient 11/23/11 [phone]      Chief Complaint  Patient presents with  . Hernia    (Consider location/radiation/quality/duration/timing/severity/associated sxs/prior treatment) HPI Patient is an 78 year old male with history of known left-sided hernia who presents today complaining of having pain overnight since lifting some heavy water bottles yesterday. Patient reports that the pain at home was severe and located in the left groin area. He denies any abdominal pain, nausea, vomiting, or fevers. Patient has had a normal bowel movement this morning. At home he noted that he had a hard bulge in the left inguinal region but this is resolved. Patient has had resolution of his pain is well. He has been told by his primary care doctor that he has a hernia and that if he gets stuck like it was this morning he should be seen by Dr. Patient has no symptoms currently. He is just slightly sore on palpation. There are no other associated or modifying factors. Past Medical History  Diagnosis Date  . Coronary artery disease     post bypass  . Hypertension   . PVD (peripheral vascular disease)   . CVD (cardiovascular disease)   . Hyperlipidemia   . Diverticular disease   . Bigeminal rhythm   . Hernia     Past Surgical History  Procedure Date  . Coronary artery bypass graft 1999  . Carotid endarterectomy     left    Family History  Problem Relation Age of Onset  . Coronary artery disease Other     History  Substance Use Topics  . Smoking status: Former Research scientist (life sciences)  . Smokeless tobacco: Not on file  . Alcohol Use: No      Review of Systems  Constitutional: Negative.   HENT: Negative.   Eyes: Negative.   Respiratory: Negative.   Cardiovascular: Negative.   Gastrointestinal: Negative.   Genitourinary:       Left inguinal bulge and pain earlier today.  Musculoskeletal: Negative.   Skin:  Negative.   Neurological: Negative.   Hematological: Negative.   Psychiatric/Behavioral: Negative.   All other systems reviewed and are negative.    Allergies  Penicillins  Home Medications   Current Outpatient Rx  Name Route Sig Dispense Refill  . AMLODIPINE BESYLATE 10 MG PO TABS Oral Take 1 tablet (10 mg total) by mouth daily. 30 tablet 6  . ASPIRIN 81 MG PO TABS Oral Take 81 mg by mouth daily.      . ATORVASTATIN CALCIUM 20 MG PO TABS Oral Take 20 mg by mouth at bedtime.    Marland Kitchen ALIGN PO CAPS Oral Take 1 capsule by mouth daily.    Marland Kitchen CARVEDILOL 12.5 MG PO TABS Oral Take 12.5 mg by mouth 2 (two) times daily with a meal.      . CINNAMON 500 MG PO TABS Oral Take 2 tablets by mouth every evening.     . CO Q 10 100 MG PO CAPS Oral Take 1 capsule by mouth daily.    . OMEGA-3 FATTY ACIDS 1000 MG PO CAPS Oral Take 1-2 g by mouth 2 (two) times daily. Take 2 capsules in the morning and 1 capsule at night    . HYDROCODONE-ACETAMINOPHEN 5-325 MG PO TABS  Take 1-2 tablets every 6 hours as needed for pain. 10 tablet 0  . LISINOPRIL 20 MG PO TABS Oral Take 20 mg by mouth  daily.      . ADULT MULTIVITAMIN W/MINERALS CH Oral Take 1 tablet by mouth daily.    Marland Kitchen PRESERVISION AREDS PO Oral Take 1 tablet by mouth 2 (two) times daily.     Marland Kitchen NITROGLYCERIN 0.4 MG SL SUBL Sublingual Place 0.4 mg under the tongue every 5 (five) minutes as needed. For chest pain    . ZINC GLUCONATE 50 MG PO TABS Oral Take 50 mg by mouth daily.        BP 169/69  Pulse 67  Temp 98.1 F (36.7 C) (Oral)  Resp 18  SpO2 100%  Physical Exam  Nursing note and vitals reviewed. GEN: Well-developed, well-nourished male in no distress HEENT: Atraumatic, normocephalic. Oropharynx clear without erythema EYES: PERRLA BL, no scleral icterus. NECK: Trachea midline, no meningismus CV: regular rate and rhythm. No murmurs, rubs, or gallops PULM: No respiratory distress.  No crackles, wheezes, or rales. GI: soft, non-tender. No  guarding, rebound, or tenderness. + bowel sounds  GU: No hernia appreciated on my exam in the inguinal region. None was appreciated during Valsalva with cough either.  Neuro: cranial nerves grossly 2-12 intact, no abnormalities of strength or sensation, A and O x 3 MSK: Patient moves all 4 extremities symmetrically, no deformity, edema, or injury noted Skin: No rashes petechiae, purpura, or jaundice Psych: no abnormality of mood   ED Course  Procedures (including critical care time)  Labs Reviewed - No data to display No results found.   1. Left inguinal hernia       MDM  Patient was evaluated by myself. He was hemodynamically stable and his symptoms had resolved prior to my evaluation. Patient was slightly sore from his symptoms earlier this morning. From history patient had presentation consistent with an inguinal hernia that had briefly been nonreducible and now was no longer present. Patient I discussed his prior history of diverticular disease and after our discussion we both agreed this was unlikely. Patient no urinary symptoms to suggest urinary pathology for symptoms. We discussed elective hernia repair as well as management of nonreducible hernia at home including the application of ice and pain medication. Patient was given a prescription for 10 tablets of Vicodin that he could try using if he had to do this again. Patient was comfortable with plan for discharge was discharged home. No additional testing was necessary today.        Chauncy Passy, MD 11/23/11 1013

## 2011-11-23 NOTE — ED Notes (Signed)
Patient states that he has a L side hernia, that started hurting yesterday, & has continued to hurt this morning. Had to lift water bottles yesterday and he thinks that may have caused this onset of pain.

## 2011-11-26 DIAGNOSIS — H40019 Open angle with borderline findings, low risk, unspecified eye: Secondary | ICD-10-CM | POA: Diagnosis not present

## 2011-11-26 DIAGNOSIS — H353190 Nonexudative age-related macular degeneration, unspecified eye, stage unspecified: Secondary | ICD-10-CM | POA: Diagnosis not present

## 2011-12-02 ENCOUNTER — Other Ambulatory Visit: Payer: Self-pay | Admitting: Dermatology

## 2011-12-02 DIAGNOSIS — D485 Neoplasm of uncertain behavior of skin: Secondary | ICD-10-CM | POA: Diagnosis not present

## 2011-12-02 DIAGNOSIS — B079 Viral wart, unspecified: Secondary | ICD-10-CM | POA: Diagnosis not present

## 2011-12-19 ENCOUNTER — Ambulatory Visit (INDEPENDENT_AMBULATORY_CARE_PROVIDER_SITE_OTHER): Payer: Medicare Other | Admitting: Surgery

## 2011-12-26 ENCOUNTER — Ambulatory Visit (INDEPENDENT_AMBULATORY_CARE_PROVIDER_SITE_OTHER): Payer: Medicare Other | Admitting: Surgery

## 2011-12-26 ENCOUNTER — Encounter (INDEPENDENT_AMBULATORY_CARE_PROVIDER_SITE_OTHER): Payer: Self-pay | Admitting: Surgery

## 2011-12-26 ENCOUNTER — Encounter (INDEPENDENT_AMBULATORY_CARE_PROVIDER_SITE_OTHER): Payer: Self-pay | Admitting: *Deleted

## 2011-12-26 VITALS — BP 136/66 | HR 80 | Temp 97.4°F | Resp 20 | Ht 68.0 in | Wt 162.0 lb

## 2011-12-26 DIAGNOSIS — K4020 Bilateral inguinal hernia, without obstruction or gangrene, not specified as recurrent: Secondary | ICD-10-CM | POA: Insufficient documentation

## 2011-12-26 NOTE — Patient Instructions (Signed)
Thanks for your patience.  If you need further assistance after leaving the office, please call our office and speak with a [facility] nurse.  [phone].  If you want to leave a message for Dr. Hassell Done, please call his office phone at [phone].  Inguinal Hernia, Adult Muscles help keep everything in the body in its proper place. But if a weak spot in the muscles develops, something can poke through. That is called a hernia. When this happens in the lower part of the belly (abdomen), it is called an inguinal hernia. (It takes its name from a part of the body in this region called the inguinal canal.) A weak spot in the wall of muscles lets some fat or part of the small intestine bulge through. An inguinal hernia can develop at any age. Men get them more often than women. CAUSES  In adults, an inguinal hernia develops over time.  It can be triggered by:   Suddenly straining the muscles of the lower abdomen.   Lifting heavy objects.   Straining to have a bowel movement. Difficult bowel movements (constipation) can lead to this.   Constant coughing. This may be caused by smoking or lung disease.   Being overweight.   Being pregnant.   Working at a job that requires long periods of standing or heavy lifting.   Having had an inguinal hernia before.  One type can be an emergency situation. It is called a strangulated inguinal hernia. It develops if part of the small intestine slips through the weak spot and cannot get back into the abdomen. The blood supply can be cut off. If that happens, part of the intestine may die. This situation requires emergency surgery. SYMPTOMS  Often, a small inguinal hernia has no symptoms. It is found when a healthcare provider does a physical exam. Larger hernias usually have symptoms.   In adults, symptoms may include:   A lump in the groin. This is easier to see when the person is standing. It might disappear when lying down.   In men, a lump in the  scrotum.   Pain or burning in the groin. This occurs especially when lifting, straining or coughing.   A dull ache or feeling of pressure in the groin.   Signs of a strangulated hernia can include:   A bulge in the groin that becomes very painful and tender to the touch.   A bulge that turns red or purple.   Fever, nausea and vomiting.   Inability to have a bowel movement or to pass gas.  DIAGNOSIS  To decide if you have an inguinal hernia, a healthcare provider will probably do a physical examination.  This will include asking questions about any symptoms you have noticed.   The healthcare provider might feel the groin area and ask you to cough. If an inguinal hernia is felt, the healthcare provider may try to slide it back into the abdomen.   Usually no other tests are needed.  TREATMENT  Treatments can vary. The size of the hernia makes a difference. Options include:  Watchful waiting. This is often suggested if the hernia is small and you have had no symptoms.   No medical procedure will be done unless symptoms develop.   You will need to watch closely for symptoms. If any occur, contact your healthcare provider right away.   Surgery. This is used if the hernia is larger or you have symptoms.   Open surgery. This is usually an  outpatient procedure (you will not stay overnight in a hospital). An cut (incision) is made through the skin in the groin. The hernia is put back inside the abdomen. The weak area in the muscles is then repaired by herniorrhaphy or hernioplasty. Herniorrhaphy: in this type of surgery, the weak muscles are sewn back together. Hernioplasty: a patch or mesh is used to close the weak area in the abdominal wall.   Laparoscopy. In this procedure, a surgeon makes small incisions. A thin tube with a tiny video camera (called a laparoscope) is put into the abdomen. The surgeon repairs the hernia with mesh by looking with the video camera and using two long  instruments.  HOME CARE INSTRUCTIONS   After surgery to repair an inguinal hernia:   You will need to take pain medicine prescribed by your healthcare provider. Follow all directions carefully.   You will need to take care of the wound from the incision.   Your activity will be restricted for awhile. This will probably include no heavy lifting for several weeks. You also should not do anything too active for a few weeks. When you can return to work will depend on the type of job that you have.   During "watchful waiting" periods, you should:   Maintain a healthy weight.   Eat a diet high in fiber (fruits, vegetables and whole grains).   Drink plenty of fluids to avoid constipation. This means drinking enough water and other liquids to keep your urine clear or pale yellow.   Do not lift heavy objects.   Do not stand for long periods of time.   Quit smoking. This should keep you from developing a frequent cough.  SEEK MEDICAL CARE IF:   A bulge develops in your groin area.   You feel pain, a burning sensation or pressure in the groin. This might be worse if you are lifting or straining.   You develop a fever of more than 100.5 F (38.1 C).  SEEK IMMEDIATE MEDICAL CARE IF:   Pain in the groin increases suddenly.   A bulge in the groin gets bigger suddenly and does not go down.   For men, there is sudden pain in the scrotum. Or, the size of the scrotum increases.   A bulge in the groin area becomes red or purple and is painful to touch.   You have nausea or vomiting that does not go away.   You feel your heart beating much faster than normal.   You cannot have a bowel movement or pass gas.   You develop a fever of more than 102.0 F (38.9 C).  Document Released: 08/31/2008 Document Revised: 04/03/2011 Document Reviewed: 08/31/2008 [facility] Patient Information 2012 [place].  Inguinal Hernia, Adult  Care After Refer to this sheet in the next few weeks. These  discharge instructions provide you with general information on caring for yourself after you leave the hospital. Your caregiver may also give you specific instructions. Your treatment has been planned according to the most current medical practices available, but unavoidable complications sometimes occur. If you have any problems or questions after discharge, please call your caregiver. HOME CARE INSTRUCTIONS  Put ice on the operative site.   Put ice in a plastic bag.   Place a towel between your skin and the bag.   Leave the ice on for 15 to 20 minutes at a time, 3 to 4 times a day while awake.   Change bandages (dressings) as directed.  Keep the wound dry and clean. The wound may be washed gently with soap and water. Gently blot or dab the wound dry. It is okay to take showers 24 to 48 hours after surgery. Do not take baths, use swimming pools, or use hot tubs for 10 days, or as directed by your caregiver.   Only take over-the-counter or prescription medicines for pain, discomfort, or fever as directed by your caregiver.   Continue your normal diet as directed.   Do not lift anything more than 10 pounds or play contact sports for 3 weeks, or as directed.  SEEK MEDICAL CARE IF:  There is redness, swelling, or increasing pain in the wound.   There is fluid (pus) coming from the wound.   There is drainage from a wound lasting longer than 1 day.   You have an oral temperature above 102 F (38.9 C).   You notice a bad smell coming from the wound or dressing.   The wound breaks open after the stitches (sutures) have been removed.   You notice increasing pain in the shoulders (shoulder strap areas).   You develop dizzy episodes or fainting while standing.   You feel sick to your stomach (nauseous) or throw up (vomit).  SEEK IMMEDIATE MEDICAL CARE IF:  You develop a rash.   You have difficulty breathing.   You develop a reaction or have side effects to medicines you were  given.  MAKE SURE YOU:   Understand these instructions.   Will watch your condition.   Will get help right away if you are not doing well or get worse.  Document Released: 05/15/2006 Document Revised: 04/03/2011 Document Reviewed: 03/14/2009 [facility] Patient Information 2012 [place].

## 2011-12-26 NOTE — Progress Notes (Signed)
Chief Complaint:  New symptomatic left inguinal hernia with older right inguinal hernia  History of Present Illness:  [name] is an 78 y.o. male who is active but with multiple comorbidities presents with a painful left inguinal hernia.  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 It is been able to be reduced. He denies any history of obstruction. He has had a right inguinal hernia for some time but it is not as uncomfortable.  I discussed laparoscopic and open hernias with him and if we were to do bilateral I would  likely favor a laparoscopic approach. He seems comfortable with that it was to proceed with laparoscopic bilateral inguinal hernia repair. I gave him a booklet on this and discussed the risks benefits of this procedure.  I mentioned that if we could not do him laparoscopically, then we would perform the surgery in an open fashion.    Past Medical History  Diagnosis Date  . Coronary artery disease     post bypass  . Hypertension   . PVD (peripheral vascular disease)   . CVD (cardiovascular disease)   . Hyperlipidemia   . Diverticular disease   . Bigeminal rhythm   . Hernia     Past Surgical History  Procedure Date  . Coronary artery bypass graft 1999  . Carotid endarterectomy 2009    left    Current Outpatient Prescriptions  Medication Sig Dispense Refill  . amLODipine (NORVASC) 10 MG tablet Take 1 tablet (10 mg total) by mouth daily.  30 tablet  6  . aspirin 81 MG tablet Take 81 mg by mouth daily.        Marland Kitchen atorvastatin (LIPITOR) 20 MG tablet Take 20 mg by mouth at bedtime.      . bifidobacterium infantis (ALIGN) capsule Take 1 capsule by mouth daily.      . carvedilol (COREG) 12.5 MG tablet Take 12.5 mg by mouth 2 (two) times daily with a meal.        . Cinnamon 500 MG TABS Take 2 tablets by mouth every evening.       . Coenzyme Q10 (CO Q 10) 100 MG CAPS Take 1 capsule by mouth daily.      . fish  oil-omega-3 fatty acids 1000 MG capsule Take 1-2 g by mouth 2 (two) times daily. Take 2 capsules in the morning and 1 capsule at night      . HYDROcodone-acetaminophen (NORCO/VICODIN) 5-325 MG per tablet Take 1-2 tablets every 6 hours as needed for pain.  10 tablet  0  . lisinopril (PRINIVIL,ZESTRIL) 20 MG tablet Take 20 mg by mouth daily.        . Multiple Vitamin (MULITIVITAMIN WITH MINERALS) TABS Take 1 tablet by mouth daily.      . Multiple Vitamins-Minerals (PRESERVISION AREDS PO) Take 1 tablet by mouth 2 (two) times daily.       . nitroGLYCERIN (NITROSTAT) 0.4 MG SL tablet Place 0.4 mg under the tongue every 5 (five) minutes as needed. For chest pain      . zinc gluconate 50 MG tablet Take 50 mg by mouth daily.         Penicillins Family History  Problem Relation Age of Onset  . Coronary artery disease Father   . Stroke Mother    Social History:   reports that he quit smoking about 50 years ago. He does not have any smokeless tobacco history on  file. He reports that he does not drink alcohol or use illicit drugs.   REVIEW OF SYSTEMS - PERTINENT POSITIVES ONLY: No DVT.  Significant ASCVD noted fore which he sees [facility]  Physical Exam:   Blood pressure 136/66, pulse 80, temperature 97.4 F (36.3 C), temperature source Temporal, resp. rate 20, height 5\' 8"  (1.727 m), weight 162 lb (73.483 kg). Body mass index is 24.63 kg/(m^2).  Gen:  WDWN WF NAD  Neurological: Alert and oriented to person, place, and time. Motor and sensory function is grossly intact  Head: Normocephalic and atraumatic.  Eyes: Conjunctivae are normal. Pupils are equal, round, and reactive to light. No scleral icterus.  Neck: Normal range of motion. Neck supple. No tracheal deviation or thyromegaly present.  Cardiovascular:  SR without murmurs or gallops.  No carotid bruits Respiratory: Effort normal.  No respiratory distress. No chest wall tenderness. Breath sounds normal.  No wheezes, rales or rhonchi.    Abdomen:  Bilateral inguinal hernia with the left great than the right.   GU: Musculoskeletal: Normal range of motion. Extremities are nontender. No cyanosis, edema or clubbing noted Lymphadenopathy: No cervical, preauricular, postauricular or axillary adenopathy is present Skin: Skin is warm and dry. No rash noted. No diaphoresis. No erythema. No pallor. Pscyh: Normal mood and affect. Behavior is normal. Judgment and thought content normal.   LABORATORY RESULTS: No results found for this or any previous visit (from the past 48 hour(s)).  RADIOLOGY RESULTS: No results found.  Problem List: Patient Active Problem List  Diagnosis  . HYPERCHOLESTEROLEMIA  IIA  . HYPERTENSION  . CAD, ARTERY BYPASS GRAFT  . BIGEMINY  . CAROTID ARTERY DISEASE  . ATHEROSCLEROSIS W /INT CLAUDICATION  . PVD  . DIABETES MELLITUS, BORDERLINE  . DIVERTICULITIS, HX OF  . Chest pain at rest  . Bilateral inguinal hernia-L>R    Assessment & Plan: Bilateral inguinal herniae;  Plan laparoscopic bilateral hernia repairs at [facility].      [name] Done, MD, [facility] [phone] beeper [phone]  12/26/2011 11:44 AM

## 2012-01-01 ENCOUNTER — Encounter: Payer: Self-pay | Admitting: *Deleted

## 2012-01-05 DIAGNOSIS — M109 Gout, unspecified: Secondary | ICD-10-CM | POA: Diagnosis not present

## 2012-01-05 DIAGNOSIS — R3989 Other symptoms and signs involving the genitourinary system: Secondary | ICD-10-CM | POA: Diagnosis not present

## 2012-01-05 DIAGNOSIS — I10 Essential (primary) hypertension: Secondary | ICD-10-CM | POA: Diagnosis not present

## 2012-01-05 DIAGNOSIS — E119 Type 2 diabetes mellitus without complications: Secondary | ICD-10-CM | POA: Diagnosis not present

## 2012-01-20 ENCOUNTER — Encounter (HOSPITAL_COMMUNITY): Payer: Self-pay | Admitting: Pharmacy Technician

## 2012-01-28 DIAGNOSIS — Z23 Encounter for immunization: Secondary | ICD-10-CM | POA: Diagnosis not present

## 2012-02-04 ENCOUNTER — Encounter (HOSPITAL_COMMUNITY): Payer: Self-pay

## 2012-02-04 ENCOUNTER — Encounter (HOSPITAL_COMMUNITY)
Admission: RE | Admit: 2012-02-04 | Discharge: 2012-02-04 | Disposition: A | Discharge status: Home / Self Care | Payer: Medicare Other | Source: Ambulatory Visit | Attending: Surgery | Admitting: Surgery

## 2012-02-04 DIAGNOSIS — E785 Hyperlipidemia, unspecified: Secondary | ICD-10-CM | POA: Diagnosis not present

## 2012-02-04 DIAGNOSIS — I739 Peripheral vascular disease, unspecified: Secondary | ICD-10-CM | POA: Diagnosis not present

## 2012-02-04 DIAGNOSIS — I10 Essential (primary) hypertension: Secondary | ICD-10-CM | POA: Diagnosis not present

## 2012-02-04 DIAGNOSIS — Z79899 Other long term (current) drug therapy: Secondary | ICD-10-CM | POA: Diagnosis not present

## 2012-02-04 DIAGNOSIS — K4020 Bilateral inguinal hernia, without obstruction or gangrene, not specified as recurrent: Secondary | ICD-10-CM | POA: Diagnosis not present

## 2012-02-04 DIAGNOSIS — Z862 Personal history of diseases of the blood and blood-forming organs and certain disorders involving the immune mechanism: Secondary | ICD-10-CM | POA: Diagnosis not present

## 2012-02-04 DIAGNOSIS — I2510 Atherosclerotic heart disease of native coronary artery without angina pectoris: Secondary | ICD-10-CM | POA: Diagnosis not present

## 2012-02-04 HISTORY — DX: Spontaneous ecchymoses: R23.3

## 2012-02-04 HISTORY — DX: Other skin changes: R23.8

## 2012-02-04 LAB — CBC
HCT: 34.1 % — ABNORMAL LOW (ref 39.0–52.0)
Hemoglobin: 11.6 g/dL — ABNORMAL LOW (ref 13.0–17.0)
MCH: 29.6 pg (ref 26.0–34.0)
MCHC: 34 g/dL (ref 30.0–36.0)
RDW: 16.2 % — ABNORMAL HIGH (ref 11.5–15.5)

## 2012-02-04 LAB — SURGICAL PCR SCREEN: MRSA, PCR: NEGATIVE

## 2012-02-04 LAB — BASIC METABOLIC PANEL
BUN: 17 mg/dL (ref 6–23)
Calcium: 8.8 mg/dL (ref 8.4–10.5)
GFR calc non Af Amer: 54 mL/min — ABNORMAL LOW (ref >90)
Glucose, Bld: 91 mg/dL (ref 70–99)
Sodium: 142 mEq/L (ref 135–145)

## 2012-02-04 NOTE — Progress Notes (Signed)
02/04/12 1445  OBSTRUCTIVE SLEEP APNEA  Have you ever been diagnosed with sleep apnea through a sleep study? No  Do you snore loudly (loud enough to be heard through closed doors)?  1  Do you often feel tired, fatigued, or sleepy during the daytime? 0  Has anyone observed you stop breathing during your sleep? 0  Do you have, or are you being treated for high blood pressure? 1  BMI more than 35 kg/m2? 0  Age over 76 years old? 1  Neck circumference greater than 40 cm/18 inches? 0  Gender: 1  Obstructive Sleep Apnea Score 4   Score 4 or greater  Results sent to PCP

## 2012-02-04 NOTE — Pre-Procedure Instructions (Signed)
CT angio chest EPIC 2/13, EKG   EPIC, 2/13, LOV Dr Johnsie Cancel 06/11/11

## 2012-02-04 NOTE — Pre-Procedure Instructions (Signed)
Instructed patient [name] want him to do fleets enema per rectum night before surgery. Verbalizes understanding

## 2012-02-04 NOTE — Patient Instructions (Signed)
[place]  02/04/2012   Your procedure is scheduled on:  02/11/12   Wednesday    Surgery  [id number]  Report to [facility] at   [place]    AM.  Call this number if you have problems the morning of surgery: [phone]        Remember:   Do not eat food  Or drink :After Midnight.  Tuesday NIGHT   Take these medicines the morning of surgery with A SIP OF WATER: NORVASC,  CARVEDILOL                     MAY TAKE NITROGLYCERIN  If needed      .  Contacts, dentures or partial plates can not be worn to surgery  Leave suitcase in the car. After surgery it may be brought to your room.  For patients admitted to the hospital, checkout time is 11:00 AM day of  discharge.             SPECIAL INSTRUCTIONS- SEE [place] PREPARING FOR SURGERY INSTRUCTION SHEET-     DO NOT WEAR JEWELRY, LOTIONS, POWDERS, OR PERFUMES.  WOMEN-- DO NOT SHAVE LEGS OR UNDERARMS FOR 12 HOURS BEFORE SHOWERS. MEN MAY SHAVE FACE.  Patients discharged the day of surgery will not be allowed to drive home. IF going home the day of surgery, you must have a driver and someone to stay with you for the first 24 hours  Name and phone number of your driver:    wife                                                                    Please read over the following fact sheets that you were given: MRSA Information, Incentive Spirometry Sheet, Blood Transfusion Sheet  Information                                                                                   [name]  PST 336  [id number]

## 2012-02-09 ENCOUNTER — Telehealth (INDEPENDENT_AMBULATORY_CARE_PROVIDER_SITE_OTHER): Payer: Self-pay | Admitting: General Surgery

## 2012-02-09 ENCOUNTER — Encounter (INDEPENDENT_AMBULATORY_CARE_PROVIDER_SITE_OTHER): Payer: Self-pay | Admitting: Surgery

## 2012-02-09 DIAGNOSIS — M109 Gout, unspecified: Secondary | ICD-10-CM | POA: Insufficient documentation

## 2012-02-09 NOTE — Telephone Encounter (Signed)
Pt called this morning to let Dr Hassell Done know that he has gout in his right foot and he started colcrys 0.6mg  1-2 tabs daily has needed for gout. Pt started the med in Sunday 02-08-2012. I called pt back to let him that Dr Hassell Done is aware of the gout and the med he is taking and I also called Delilah Shan and will route message to [facility] and Dr Hassell Done, pt is aware that he is thinking about it and may call him on his cell. [phone]. Pt is having Bil Ing Hernia on 02-11-12

## 2012-02-10 NOTE — Anesthesia Preprocedure Evaluation (Addendum)
Anesthesia Evaluation  Patient identified by MRN, date of birth, ID band Patient awake    Reviewed: Allergy & Precautions, H&P , NPO status , Patient's Chart, lab work & pertinent test results, reviewed documented beta blocker date and time   Airway Mallampati: II TM Distance: >3 FB Neck ROM: full    Dental  (+) Caps and Dental Advisory Given,    Pulmonary neg pulmonary ROS,  breath sounds clear to auscultation  Pulmonary exam normal       Cardiovascular Exercise Tolerance: Good hypertension, Pt. on home beta blockers and Pt. on medications + CAD and + CABG Rhythm:regular Rate:Normal  Hx. bigeminy   Neuro/Psych S/p CEA negative neurological ROS  negative psych ROS   GI/Hepatic negative GI ROS, Neg liver ROS,   Endo/Other  negative endocrine ROSBorderline DM  Renal/GU negative Renal ROS  negative genitourinary   Musculoskeletal   Abdominal   Peds  Hematology negative hematology ROS (+)   Anesthesia Other Findings   Reproductive/Obstetrics negative OB ROS                          Anesthesia Physical Anesthesia Plan  ASA: III  Anesthesia Plan: General   Post-op Pain Management:    Induction: Intravenous  Airway Management Planned: Oral ETT  Additional Equipment:   Intra-op Plan:   Post-operative Plan: Extubation in OR  Informed Consent: I have reviewed the patients History and Physical, chart, labs and discussed the procedure including the risks, benefits and alternatives for the proposed anesthesia with the patient or authorized representative who has indicated his/her understanding and acceptance.   Dental Advisory Given  Plan Discussed with: CRNA and Surgeon  Anesthesia Plan Comments:         Anesthesia Quick Evaluation

## 2012-02-11 ENCOUNTER — Observation Stay (HOSPITAL_COMMUNITY)
Admission: RE | Admit: 2012-02-11 | Discharge: 2012-02-12 | Disposition: A | Discharge status: Home / Self Care | Payer: Medicare Other | Source: Ambulatory Visit | Attending: Surgery | Admitting: Surgery

## 2012-02-11 ENCOUNTER — Encounter (HOSPITAL_COMMUNITY): Payer: Self-pay | Admitting: Anesthesiology

## 2012-02-11 ENCOUNTER — Ambulatory Visit (HOSPITAL_COMMUNITY): Payer: Medicare Other | Admitting: Anesthesiology

## 2012-02-11 ENCOUNTER — Encounter (HOSPITAL_COMMUNITY)
Admission: RE | Disposition: A | Discharge status: Home / Self Care | Payer: Self-pay | Source: Ambulatory Visit | Attending: Surgery

## 2012-02-11 ENCOUNTER — Encounter (HOSPITAL_COMMUNITY): Payer: Self-pay | Admitting: *Deleted

## 2012-02-11 DIAGNOSIS — Z8639 Personal history of other endocrine, nutritional and metabolic disease: Secondary | ICD-10-CM | POA: Insufficient documentation

## 2012-02-11 DIAGNOSIS — E7800 Pure hypercholesterolemia, unspecified: Secondary | ICD-10-CM

## 2012-02-11 DIAGNOSIS — I739 Peripheral vascular disease, unspecified: Secondary | ICD-10-CM | POA: Insufficient documentation

## 2012-02-11 DIAGNOSIS — Z862 Personal history of diseases of the blood and blood-forming organs and certain disorders involving the immune mechanism: Secondary | ICD-10-CM | POA: Insufficient documentation

## 2012-02-11 DIAGNOSIS — K4020 Bilateral inguinal hernia, without obstruction or gangrene, not specified as recurrent: Secondary | ICD-10-CM | POA: Diagnosis not present

## 2012-02-11 DIAGNOSIS — I10 Essential (primary) hypertension: Secondary | ICD-10-CM | POA: Insufficient documentation

## 2012-02-11 DIAGNOSIS — Z79899 Other long term (current) drug therapy: Secondary | ICD-10-CM | POA: Insufficient documentation

## 2012-02-11 DIAGNOSIS — E785 Hyperlipidemia, unspecified: Secondary | ICD-10-CM | POA: Insufficient documentation

## 2012-02-11 DIAGNOSIS — I2510 Atherosclerotic heart disease of native coronary artery without angina pectoris: Secondary | ICD-10-CM | POA: Insufficient documentation

## 2012-02-11 HISTORY — PX: INGUINAL HERNIA REPAIR: SHX194

## 2012-02-11 LAB — CBC
MCH: 29.6 pg (ref 26.0–34.0)
MCHC: 34.2 g/dL (ref 30.0–36.0)
Platelets: 231 10*3/uL (ref 150–400)
RBC: 4.09 MIL/uL — ABNORMAL LOW (ref 4.22–5.81)

## 2012-02-11 LAB — CREATININE, SERUM: Creatinine, Ser: 1.51 mg/dL — ABNORMAL HIGH (ref 0.50–1.35)

## 2012-02-11 SURGERY — REPAIR, HERNIA, INGUINAL, BILATERAL, LAPAROSCOPIC
Anesthesia: General | Site: Groin | Laterality: Bilateral | Wound class: Clean

## 2012-02-11 MED ORDER — CARVEDILOL 12.5 MG PO TABS
12.5000 mg | ORAL_TABLET | Freq: Two times a day (BID) | ORAL | Status: DC
  Administered 2012-02-11 – 2012-02-12 (×2): 12.5 mg via ORAL
  Filled 2012-02-11 (×4): qty 1

## 2012-02-11 MED ORDER — ONDANSETRON HCL 4 MG/2ML IJ SOLN
INTRAMUSCULAR | Status: DC | PRN
  Administered 2012-02-11: 4 mg via INTRAVENOUS

## 2012-02-11 MED ORDER — DEXAMETHASONE SODIUM PHOSPHATE 10 MG/ML IJ SOLN
INTRAMUSCULAR | Status: DC | PRN
  Administered 2012-02-11: 10 mg via INTRAVENOUS

## 2012-02-11 MED ORDER — 0.9 % SODIUM CHLORIDE (POUR BTL) OPTIME
TOPICAL | Status: DC | PRN
  Administered 2012-02-11: 1000 mL

## 2012-02-11 MED ORDER — LACTATED RINGERS IV SOLN: INTRAVENOUS | Status: DC

## 2012-02-11 MED ORDER — FENTANYL CITRATE 0.05 MG/ML IJ SOLN
INTRAMUSCULAR | Status: DC | PRN
  Administered 2012-02-11 (×4): 25 ug via INTRAVENOUS
  Administered 2012-02-11: 50 ug via INTRAVENOUS

## 2012-02-11 MED ORDER — GLYCOPYRROLATE 0.2 MG/ML IJ SOLN
INTRAMUSCULAR | Status: DC | PRN
  Administered 2012-02-11: 0.2 mg via INTRAVENOUS
  Administered 2012-02-11: 0.6 mg via INTRAVENOUS

## 2012-02-11 MED ORDER — TAMSULOSIN HCL 0.4 MG PO CAPS
0.4000 mg | ORAL_CAPSULE | Freq: Every day | ORAL | Status: DC
  Administered 2012-02-11: 0.4 mg via ORAL
  Filled 2012-02-11 (×2): qty 1

## 2012-02-11 MED ORDER — SUCCINYLCHOLINE CHLORIDE 20 MG/ML IJ SOLN
INTRAMUSCULAR | Status: DC | PRN
  Administered 2012-02-11: 100 mg via INTRAVENOUS

## 2012-02-11 MED ORDER — ROCURONIUM BROMIDE 100 MG/10ML IV SOLN
INTRAVENOUS | Status: DC | PRN
  Administered 2012-02-11: 30 mg via INTRAVENOUS

## 2012-02-11 MED ORDER — BUPIVACAINE LIPOSOME 1.3 % IJ SUSP
20.0000 mL | Freq: Once | INTRAMUSCULAR | Status: AC
  Administered 2012-02-11: 20 mL
  Filled 2012-02-11: qty 20

## 2012-02-11 MED ORDER — NEOSTIGMINE METHYLSULFATE 1 MG/ML IJ SOLN
INTRAMUSCULAR | Status: DC | PRN
  Administered 2012-02-11: 5 mg via INTRAVENOUS

## 2012-02-11 MED ORDER — LISINOPRIL 20 MG PO TABS
20.0000 mg | ORAL_TABLET | Freq: Every day | ORAL | Status: DC
  Filled 2012-02-11: qty 1

## 2012-02-11 MED ORDER — NITROGLYCERIN 0.4 MG SL SUBL: 0.4000 mg | SUBLINGUAL_TABLET | SUBLINGUAL | Status: DC | PRN

## 2012-02-11 MED ORDER — ASPIRIN 81 MG PO CHEW
81.0000 mg | CHEWABLE_TABLET | Freq: Every day | ORAL | Status: DC
  Administered 2012-02-11: 81 mg via ORAL
  Filled 2012-02-11 (×2): qty 1

## 2012-02-11 MED ORDER — PROPOFOL 10 MG/ML IV BOLUS
INTRAVENOUS | Status: DC | PRN
  Administered 2012-02-11: 130 mg via INTRAVENOUS

## 2012-02-11 MED ORDER — KCL IN DEXTROSE-NACL 20-5-0.45 MEQ/L-%-% IV SOLN
INTRAVENOUS | Status: DC
  Administered 2012-02-11: 11:00:00 via INTRAVENOUS
  Filled 2012-02-11 (×2): qty 1000

## 2012-02-11 MED ORDER — MORPHINE SULFATE 2 MG/ML IJ SOLN: 1.0000 mg | INTRAMUSCULAR | Status: DC | PRN

## 2012-02-11 MED ORDER — LIDOCAINE HCL (CARDIAC) 20 MG/ML IV SOLN
INTRAVENOUS | Status: DC | PRN
  Administered 2012-02-11: 100 mg via INTRAVENOUS

## 2012-02-11 MED ORDER — EPHEDRINE SULFATE 50 MG/ML IJ SOLN
INTRAMUSCULAR | Status: DC | PRN
  Administered 2012-02-11: 10 mg via INTRAVENOUS
  Administered 2012-02-11 (×2): 5 mg via INTRAVENOUS

## 2012-02-11 MED ORDER — ACETAMINOPHEN 10 MG/ML IV SOLN
INTRAVENOUS | Status: DC | PRN
  Administered 2012-02-11: 1000 mg via INTRAVENOUS

## 2012-02-11 MED ORDER — ALLOPURINOL 300 MG PO TABS
300.0000 mg | ORAL_TABLET | Freq: Every day | ORAL | Status: DC
  Administered 2012-02-11: 300 mg via ORAL
  Filled 2012-02-11 (×2): qty 1

## 2012-02-11 MED ORDER — LISINOPRIL 20 MG PO TABS
20.0000 mg | ORAL_TABLET | Freq: Every day | ORAL | Status: DC
  Administered 2012-02-11: 20 mg via ORAL
  Filled 2012-02-11 (×2): qty 1

## 2012-02-11 MED ORDER — HYDROMORPHONE HCL PF 1 MG/ML IJ SOLN
0.2500 mg | INTRAMUSCULAR | Status: DC | PRN
  Administered 2012-02-11 (×2): 0.5 mg via INTRAVENOUS

## 2012-02-11 MED ORDER — HEPARIN SODIUM (PORCINE) 5000 UNIT/ML IJ SOLN
5000.0000 [IU] | Freq: Three times a day (TID) | INTRAMUSCULAR | Status: DC
  Administered 2012-02-11 – 2012-02-12 (×2): 5000 [IU] via SUBCUTANEOUS
  Filled 2012-02-11 (×5): qty 1

## 2012-02-11 MED ORDER — OXYCODONE-ACETAMINOPHEN 5-325 MG PO TABS
1.0000 | ORAL_TABLET | ORAL | Status: DC | PRN
  Administered 2012-02-12: 1 via ORAL
  Filled 2012-02-11: qty 1

## 2012-02-11 MED ORDER — ASPIRIN 81 MG PO TABS: 81.0000 mg | ORAL_TABLET | Freq: Every day | ORAL | Status: DC

## 2012-02-11 MED ORDER — ONDANSETRON HCL 4 MG PO TABS: 4.0000 mg | ORAL_TABLET | Freq: Four times a day (QID) | ORAL | Status: DC | PRN

## 2012-02-11 MED ORDER — LACTATED RINGERS IV SOLN
INTRAVENOUS | Status: DC | PRN
  Administered 2012-02-11: 08:00:00 via INTRAVENOUS

## 2012-02-11 MED ORDER — PREDNISONE 1 MG PO TABS
1.0000 mg | ORAL_TABLET | ORAL | Status: DC
  Filled 2012-02-11: qty 1

## 2012-02-11 MED ORDER — CIPROFLOXACIN IN D5W 400 MG/200ML IV SOLN
INTRAVENOUS | Status: AC
  Filled 2012-02-11: qty 200

## 2012-02-11 MED ORDER — HYDROMORPHONE HCL PF 1 MG/ML IJ SOLN
INTRAMUSCULAR | Status: AC
  Filled 2012-02-11: qty 1

## 2012-02-11 MED ORDER — ONDANSETRON HCL 4 MG/2ML IJ SOLN: 4.0000 mg | Freq: Four times a day (QID) | INTRAMUSCULAR | Status: DC | PRN

## 2012-02-11 MED ORDER — HEPARIN SODIUM (PORCINE) 5000 UNIT/ML IJ SOLN
5000.0000 [IU] | Freq: Once | INTRAMUSCULAR | Status: AC
  Administered 2012-02-11: 5000 [IU] via SUBCUTANEOUS
  Filled 2012-02-11: qty 1

## 2012-02-11 MED ORDER — CIPROFLOXACIN IN D5W 400 MG/200ML IV SOLN
400.0000 mg | INTRAVENOUS | Status: AC
  Administered 2012-02-11: 400 mg via INTRAVENOUS

## 2012-02-11 MED ORDER — ACETAMINOPHEN 10 MG/ML IV SOLN
INTRAVENOUS | Status: AC
  Filled 2012-02-11: qty 100

## 2012-02-11 MED ORDER — AMLODIPINE BESYLATE 5 MG PO TABS
5.0000 mg | ORAL_TABLET | Freq: Every day | ORAL | Status: DC
  Filled 2012-02-11 (×2): qty 1

## 2012-02-11 MED ORDER — KCL IN DEXTROSE-NACL 20-5-0.45 MEQ/L-%-% IV SOLN
INTRAVENOUS | Status: AC
Start: 2012-02-11 — End: 2012-02-11
  Filled 2012-02-11: qty 1000

## 2012-02-11 SURGICAL SUPPLY — 41 items
BENZOIN TINCTURE PRP APPL 2/3 (GAUZE/BANDAGES/DRESSINGS) ×2 IMPLANT
CABLE HIGH FREQUENCY MONO STRZ (ELECTRODE) ×2 IMPLANT
CLOTH BEACON ORANGE TIMEOUT ST (SAFETY) ×2 IMPLANT
COVER SURGICAL LIGHT HANDLE (MISCELLANEOUS) ×2 IMPLANT
DECANTER SPIKE VIAL GLASS SM (MISCELLANEOUS) ×2 IMPLANT
DERMABOND ADVANCED (GAUZE/BANDAGES/DRESSINGS) ×2
DERMABOND ADVANCED .7 DNX12 (GAUZE/BANDAGES/DRESSINGS) ×2 IMPLANT
DISSECT BALLN SPACEMKR + OVL (BALLOONS) ×2
DISSECT BALLN SPACEMKR OVL PDB (BALLOONS)
DISSECTOR BALLN SPACEMKR + OVL (BALLOONS) ×1 IMPLANT
DISSECTOR BALLN SPCMKR OVL PDB (BALLOONS) IMPLANT
DISSECTOR BLUNT TIP ENDO 5MM (MISCELLANEOUS) IMPLANT
DRAPE LAPAROSCOPIC ABDOMINAL (DRAPES) ×2 IMPLANT
DRSG TEGADERM 2-3/8X2-3/4 SM (GAUZE/BANDAGES/DRESSINGS) IMPLANT
ELECT REM PT RETURN 9FT ADLT (ELECTROSURGICAL) ×2
ELECTRODE REM PT RTRN 9FT ADLT (ELECTROSURGICAL) ×1 IMPLANT
GLOVE BIOGEL M 8.0 STRL (GLOVE) ×2 IMPLANT
GLOVE BIOGEL PI IND STRL 7.0 (GLOVE) ×1 IMPLANT
GLOVE BIOGEL PI INDICATOR 7.0 (GLOVE) ×1
GOWN STRL NON-REIN LRG LVL3 (GOWN DISPOSABLE) ×2 IMPLANT
GOWN STRL REIN XL XLG (GOWN DISPOSABLE) ×4 IMPLANT
KIT BASIN OR (CUSTOM PROCEDURE TRAY) ×2 IMPLANT
MESH 3DMAX 4X6 LT LRG (Mesh General) ×2 IMPLANT
MESH 3DMAX 4X6 RT LRG (Mesh General) ×2 IMPLANT
NEEDLE INSUFFLATION 14GA 120MM (NEEDLE) IMPLANT
NS IRRIG 1000ML POUR BTL (IV SOLUTION) ×2 IMPLANT
PEN SKIN MARKING BROAD (MISCELLANEOUS) ×2 IMPLANT
PENCIL BUTTON HOLSTER BLD 10FT (ELECTRODE) ×2 IMPLANT
SCISSORS LAP 5X35 DISP (ENDOMECHANICALS) IMPLANT
SET IRRIG TUBING LAPAROSCOPIC (IRRIGATION / IRRIGATOR) ×2 IMPLANT
SOLUTION ANTI FOG 6CC (MISCELLANEOUS) ×2 IMPLANT
STRIP CLOSURE SKIN 1/2X4 (GAUZE/BANDAGES/DRESSINGS) ×2 IMPLANT
SUT VIC AB 3-0 SH 27 (SUTURE)
SUT VIC AB 3-0 SH 27XBRD (SUTURE) IMPLANT
SUT VIC AB 4-0 SH 18 (SUTURE) ×2 IMPLANT
SYR 30ML LL (SYRINGE) ×2 IMPLANT
TACKER 5MM HERNIA 3.5CML NAB (ENDOMECHANICALS) ×2 IMPLANT
TRAY FOLEY CATH 14FRSI W/METER (CATHETERS) ×2 IMPLANT
TRAY LAP CHOLE (CUSTOM PROCEDURE TRAY) ×2 IMPLANT
TROCAR BLADELESS OPT 5 75 (ENDOMECHANICALS) ×4 IMPLANT
TUBING INSUFFLATION 10FT LAP (TUBING) ×2 IMPLANT

## 2012-02-11 NOTE — Anesthesia Postprocedure Evaluation (Signed)
  Anesthesia Post-op Note  Patient: [name]  Procedure(s) Performed: Procedure(s) (LRB): LAPAROSCOPIC BILATERAL INGUINAL HERNIA REPAIR (Bilateral) INSERTION OF MESH (Bilateral)  Patient Location: PACU  Anesthesia Type: General  Level of Consciousness: awake and alert   Airway and Oxygen Therapy: Patient Spontanous Breathing  Post-op Pain: mild  Post-op Assessment: Post-op Vital signs reviewed, Patient's Cardiovascular Status Stable, Respiratory Function Stable, Patent Airway and No signs of Nausea or vomiting  Post-op Vital Signs: stable  Complications: No apparent anesthesia complications

## 2012-02-11 NOTE — H&P (Signed)
Chief Complaint: New symptomatic left inguinal hernia with older right inguinal hernia  History of Present Illness: [name] is an 78 y.o. male who is active but with multiple comorbidities presents with a painful left inguinal hernia. 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 It is been able to be reduced. He denies any history of obstruction. He has had a right inguinal hernia for some time but it is not as uncomfortable.  I discussed laparoscopic and open hernias with him and if we were to do bilateral I would likely favor a laparoscopic approach. He seems comfortable with that it was to proceed with laparoscopic bilateral inguinal hernia repair. I gave him a booklet on this and discussed the risks benefits of this procedure. I mentioned that if we could not do him laparoscopically, then we would perform the surgery in an open fashion.  Past Medical History   Diagnosis  Date   .  Coronary artery disease      post bypass   .  Hypertension    .  PVD (peripheral vascular disease)    .  CVD (cardiovascular disease)    .  Hyperlipidemia    .  Diverticular disease    .  Bigeminal rhythm    .  Hernia     Past Surgical History   Procedure  Date   .  Coronary artery bypass graft  1999   .  Carotid endarterectomy  2009     left    Current Outpatient Prescriptions   Medication  Sig  Dispense  Refill   .  amLODipine (NORVASC) 10 MG tablet  Take 1 tablet (10 mg total) by mouth daily.  30 tablet  6   .  aspirin 81 MG tablet  Take 81 mg by mouth daily.     Marland Kitchen  atorvastatin (LIPITOR) 20 MG tablet  Take 20 mg by mouth at bedtime.     .  bifidobacterium infantis (ALIGN) capsule  Take 1 capsule by mouth daily.     .  carvedilol (COREG) 12.5 MG tablet  Take 12.5 mg by mouth 2 (two) times daily with a meal.     .  Cinnamon 500 MG TABS  Take 2 tablets by mouth every evening.     .  Coenzyme Q10 (CO Q 10) 100 MG CAPS  Take 1 capsule by mouth  daily.     .  fish oil-omega-3 fatty acids 1000 MG capsule  Take 1-2 g by mouth 2 (two) times daily. Take 2 capsules in the morning and 1 capsule at night     .  HYDROcodone-acetaminophen (NORCO/VICODIN) 5-325 MG per tablet  Take 1-2 tablets every 6 hours as needed for pain.  10 tablet  0   .  lisinopril (PRINIVIL,ZESTRIL) 20 MG tablet  Take 20 mg by mouth daily.     .  Multiple Vitamin (MULITIVITAMIN WITH MINERALS) TABS  Take 1 tablet by mouth daily.     .  Multiple Vitamins-Minerals (PRESERVISION AREDS PO)  Take 1 tablet by mouth 2 (two) times daily.     .  nitroGLYCERIN (NITROSTAT) 0.4 MG SL tablet  Place 0.4 mg under the tongue every 5 (five) minutes as needed. For chest pain     .  zinc gluconate 50 MG tablet  Take 50 mg by mouth daily.     Penicillins  Family History   Problem  Relation  Age of  Onset   .  Coronary artery disease  Father    .  Stroke  Mother    Social History: reports that he quit smoking about 50 years ago. He does not have any smokeless tobacco history on file. He reports that he does not drink alcohol or use illicit drugs.  REVIEW OF SYSTEMS - PERTINENT POSITIVES ONLY:  No DVT. Significant ASCVD noted fore which he sees [facility]  Physical Exam:  Blood pressure 136/66, pulse 80, temperature 97.4 F (36.3 C), temperature source Temporal, resp. rate 20, height 5\' 8"  (1.727 m), weight 162 lb (73.483 kg).  Body mass index is 24.63 kg/(m^2).  Gen: WDWN WF NAD  Neurological: Alert and oriented to person, place, and time. Motor and sensory function is grossly intact  Head: Normocephalic and atraumatic.  Eyes: Conjunctivae are normal. Pupils are equal, round, and reactive to light. No scleral icterus.  Neck: Normal range of motion. Neck supple. No tracheal deviation or thyromegaly present.  Cardiovascular: SR without murmurs or gallops. No carotid bruits  Respiratory: Effort normal. No respiratory distress. No chest wall tenderness. Breath sounds normal. No wheezes,  rales or rhonchi.  Abdomen: Bilateral inguinal hernia with the left great than the right.  GU:  Musculoskeletal: Normal range of motion. Extremities are nontender. No cyanosis, edema or clubbing noted Lymphadenopathy: No cervical, preauricular, postauricular or axillary adenopathy is present Skin: Skin is warm and dry. No rash noted. No diaphoresis. No erythema. No pallor. Pscyh: Normal mood and affect. Behavior is normal. Judgment and thought content normal.  LABORATORY RESULTS:  No results found for this or any previous visit (from the past 48 hour(s)).  RADIOLOGY RESULTS:  No results found.  Problem List:  Patient Active Problem List   Diagnosis   .  HYPERCHOLESTEROLEMIA IIA   .  HYPERTENSION   .  CAD, ARTERY BYPASS GRAFT   .  BIGEMINY   .  CAROTID ARTERY DISEASE   .  ATHEROSCLEROSIS W /INT CLAUDICATION   .  PVD   .  DIABETES MELLITUS, BORDERLINE   .  DIVERTICULITIS, HX OF   .  Chest pain at rest   .  Bilateral inguinal hernia-L>R   Assessment & Plan:  Bilateral inguinal herniae; Plan laparoscopic bilateral hernia repairs at [facility].  Recent gout in foot.  On colchicine [name] Done, MD, [facility]  [phone] beeper  [phone]

## 2012-02-11 NOTE — Transfer of Care (Signed)
Immediate Anesthesia Transfer of Care Note  Patient: [name]  Procedure(s) Performed: Procedure(s) (LRB): LAPAROSCOPIC BILATERAL INGUINAL HERNIA REPAIR (Bilateral) INSERTION OF MESH (Bilateral)  Patient Location: PACU  Anesthesia Type: General  Level of Consciousness: sedated, patient cooperative and responds to stimulaton  Airway & Oxygen Therapy: Patient Spontanous Breathing and Patient connected to face mask oxgen  Post-op Assessment: Report given to PACU RN and Post -op Vital signs reviewed and stable  Post vital signs: Reviewed and stable  Complications: No apparent anesthesia complications

## 2012-02-11 NOTE — Op Note (Signed)
Surgeon: [name], MD, FACS  Asst:  none  Anes:  Gen.  Procedure: Laparoscopic preperitoneal bilateral inguinal hernia repair TEP  Diagnosis: Left direct and indirect inguinal hernia, right direct inguinal hernia  Complications: none  EBL:   minimal cc  Description of Procedure:  The patient was taken to room 1 and given general anesthesia. The abdomen was prepped with horizontal and draped sterilely. After a timeout a tap approach was used cutting down below the umbilicus, dissecting lateral on the anterior rectus muscle and retracted the muscle laterally and creating a space for the dissection trocar medially. This went down to the pubis. It was then deployed using the scope as both a stent in for observation. A good deployment was present. This was kept up for a few moments to maintain hemostasis. The balloon was withdrawn and after the scope was inserted 25 mm ports were placed laterally on either side.  Dissection began on the patient's left side which was the more symptomatic hernia. There I found a direct hernia and also a large lipoma occupying a indirect defect along with the sac. These were pulled back away. Dissection on the right side found an even larger direct hernia no indirect hernia. The Roux parallel left was done first with a piece of 3-D max mesh were actually cut the lateral part of the to go around the cord structures and tacked in place with a permanent tacker. Deployed nicely and lay nicely.  Right side was fixed next and again a piece of large 3-D max mesh was deployed. It was not cut it was laid on top of the cord structures and tacked inferiorly well medially well and an anteriorly. On both sides I made sure not to go below name margin laterally right could not feel the tacker. Exparel was injected in the wounds the end of the case. He did have a moderate amount of subcutaneous air and some pneumoperitoneum noted. Closure closed 4-0 Vicryl and Dermabond. Taken  recovery room in satisfactory condition.  [name] Done, [place], [facility], [place]

## 2012-02-11 NOTE — Preoperative (Signed)
Beta Blockers   Reason not to administer Beta Blockers:Not Applicable Pt took Beta Blocker 02-11-12 AM

## 2012-02-12 ENCOUNTER — Encounter (HOSPITAL_COMMUNITY): Payer: Self-pay | Admitting: Surgery

## 2012-02-12 MED ORDER — OXYCODONE-ACETAMINOPHEN 5-325 MG PO TABS: 1.0000 | ORAL_TABLET | ORAL | Status: DC | PRN

## 2012-02-12 MED ORDER — METOPROLOL TARTRATE 1 MG/ML IV SOLN
5.0000 mg | Freq: Once | INTRAVENOUS | Status: AC
  Administered 2012-02-12: 5 mg via INTRAVENOUS
  Filled 2012-02-12: qty 5

## 2012-02-12 NOTE — Discharge Summary (Signed)
Physician Discharge Summary  Patient ID: [name] MRN: [id number] DOB/AGE: 31-May-1928 78 y.o.  Admit date: 02/11/2012 Discharge date: 02/12/2012  Admission Diagnoses:  Bilateral inguinal herniae  Discharge Diagnoses:  Right direct; left pantaloon  Active Problems:  * No active hospital problems. *    Surgery:  Laparoscopic bilateral inguinal hernia repair3  Discharged Condition: improved  Hospital Course:   Had surgery. Kept overnight and then dischargeed  Consults: none  Significant Diagnostic Studies: none    Discharge Exam: Blood pressure 189/75, pulse 80, temperature 98 F (36.7 C), temperature source Oral, resp. rate 18, height 5\' 8"  (1.727 m), weight 165 lb (74.844 kg), SpO2 100.00%. Incisions bland and scrotum without ecchymosis  Disposition: 01-Home or Self Care  Discharge Orders    Future Appointments: Provider: Department: Dept Phone: Center:   02/25/2012 2:00 PM Pedro Earls, MD [facility] [phone] None     Future Orders Please Complete By Expires   Diet - low sodium heart healthy      Increase activity slowly      Discharge instructions      Comments:   May shower now.   No wound care      Lifting restrictions      Comments:   Avoid lifting anything that causes you to strain       Medication List     As of 02/12/2012  7:51 AM    TAKE these medications         allopurinol 300 MG tablet   Commonly known as: ZYLOPRIM   Take 300 mg by mouth daily.      amLODipine 10 MG tablet   Commonly known as: NORVASC   Take 5 mg by mouth daily after breakfast.      aspirin 81 MG tablet   Take 81 mg by mouth at bedtime. Last dose will be 02/05/12      atorvastatin 20 MG tablet   Commonly known as: LIPITOR   Take 20 mg by mouth at bedtime.      bifidobacterium infantis capsule   Take 1 capsule by mouth daily.      carvedilol 12.5 MG tablet   Commonly known as: COREG   Take 12.5 mg by mouth 2 (two) times daily with a meal.     Cinnamon 500 MG Tabs   Take 2 tablets by mouth at bedtime. Last dose 02/05/12      Co Q 10 100 MG Caps   Take 1 capsule by mouth daily. Will sop 10/10      fish oil-omega-3 fatty acids 1000 MG capsule   Take 1 g by mouth 2 (two) times daily. Will stop 02/05/12      lisinopril 20 MG tablet   Commonly known as: PRINIVIL,ZESTRIL   Take 20 mg by mouth daily after breakfast.      multivitamin with minerals Tabs   Take 1 tablet by mouth daily. Will stop 02/05/12      nitroGLYCERIN 0.4 MG SL tablet   Commonly known as: NITROSTAT   Place 0.4 mg under the tongue every 5 (five) minutes as needed. For chest pain      OVER THE COUNTER MEDICATION   Take 2 tablets by mouth 2 (two) times daily. GlucoCare      oxyCODONE-acetaminophen 5-325 MG per tablet   Commonly known as: PERCOCET/ROXICET   Take 1-2 tablets by mouth every 4 (four) hours as needed.      predniSONE 1 MG tablet   Commonly known  as: DELTASONE   Take 1 mg by mouth every other day.      PRESERVISION AREDS PO   Take 1 tablet by mouth 2 (two) times daily. Will stop 02/05/12      Tamsulosin HCl 0.4 MG Caps   Commonly known as: FLOMAX   Take 0.4 mg by mouth daily after supper.      zinc gluconate 50 MG tablet   Take 50 mg by mouth daily. Stop 02/05/12           Follow-up Information    Follow up with Nastassja Witkop B, MD. In 4 weeks.   Contact information:   [address] [place] [address] [phone]          Signed: Pedro Earls 02/12/2012, 7:51 AM

## 2012-02-12 NOTE — Progress Notes (Signed)
Notified On-call doctor, Dr Marcello Moores, [place] of patient experiencing elevated blood pressure. Received order.  See medication records.

## 2012-02-12 NOTE — Care Management Note (Signed)
    Page 1 of 1   02/12/2012     11:16:07 AM   CARE MANAGEMENT NOTE 02/12/2012  Patient:  [name], [name]   Account Number:  [id number]  Date Initiated:  02/12/2012  Documentation initiated by:  Sunday Spillers  Subjective/Objective Assessment:   78 yo male admitted s/p inginal hernia repair. PTA lived at home with spouse.     Action/Plan:   Anticipated DC Date:  02/12/2012   Anticipated DC Plan:  [place]  CM consult      Choice offered to / List presented to:             Status of service:  Completed, signed off Medicare Important Message given?   (If response is "NO", the following Medicare IM given date fields will be blank) Date Medicare IM given:   Date Additional Medicare IM given:    Discharge Disposition:  HOME/SELF CARE  Per UR Regulation:  Reviewed for med. necessity/level of care/duration of stay  If discussed at [place] of Stay Meetings, dates discussed:    Comments:

## 2012-02-25 ENCOUNTER — Encounter (INDEPENDENT_AMBULATORY_CARE_PROVIDER_SITE_OTHER): Payer: Medicare Other | Admitting: Surgery

## 2012-02-27 ENCOUNTER — Ambulatory Visit (INDEPENDENT_AMBULATORY_CARE_PROVIDER_SITE_OTHER): Payer: Medicare Other | Admitting: Surgery

## 2012-02-27 ENCOUNTER — Encounter (INDEPENDENT_AMBULATORY_CARE_PROVIDER_SITE_OTHER): Payer: Self-pay | Admitting: Surgery

## 2012-02-27 VITALS — BP 134/76 | HR 71 | Temp 96.9°F | Ht 68.0 in | Wt 161.2 lb

## 2012-02-27 DIAGNOSIS — Z09 Encounter for follow-up examination after completed treatment for conditions other than malignant neoplasm: Secondary | ICD-10-CM | POA: Insufficient documentation

## 2012-02-27 NOTE — Patient Instructions (Signed)
Inguinal Hernia, Adult  Care After Refer to this sheet in the next few weeks. These discharge instructions provide you with general information on caring for yourself after you leave the hospital. Your caregiver may also give you specific instructions. Your treatment has been planned according to the most current medical practices available, but unavoidable complications sometimes occur. If you have any problems or questions after discharge, please call your caregiver. HOME CARE INSTRUCTIONS  Put ice on the operative site.  Put ice in a plastic bag.  Place a towel between your skin and the bag.  Leave the ice on for 15 to 20 minutes at a time, 3 to 4 times a day while awake.  Change bandages (dressings) as directed.  Keep the wound dry and clean. The wound may be washed gently with soap and water. Gently blot or dab the wound dry. It is okay to take showers 24 to 48 hours after surgery. Do not take baths, use swimming pools, or use hot tubs for 10 days, or as directed by your caregiver.  Only take over-the-counter or prescription medicines for pain, discomfort, or fever as directed by your caregiver.  Continue your normal diet as directed.  Do not lift anything more than 10 pounds or play contact sports for 3 weeks, or as directed. SEEK MEDICAL CARE IF:  There is redness, swelling, or increasing pain in the wound.  There is fluid (pus) coming from the wound.  There is drainage from a wound lasting longer than 1 day.  You have an oral temperature above 102 F (38.9 C).  You notice a bad smell coming from the wound or dressing.  The wound breaks open after the stitches (sutures) have been removed.  You notice increasing pain in the shoulders (shoulder strap areas).  You develop dizzy episodes or fainting while standing.  You feel sick to your stomach (nauseous) or throw up (vomit). SEEK IMMEDIATE MEDICAL CARE IF:  You develop a rash.  You have difficulty breathing.  You  develop a reaction or have side effects to medicines you were given. MAKE SURE YOU:   Understand these instructions.  Will watch your condition.  Will get help right away if you are not doing well or get worse. Document Released: 05/15/2006 Document Revised: 07/07/2011 Document Reviewed: 03/14/2009 [facility] Patient Information 2013 [place].

## 2012-02-27 NOTE — Progress Notes (Signed)
[name] 78 y.o.  Body mass index is 24.51 kg/(m^2).  Patient Active Problem List  Diagnosis  . HYPERCHOLESTEROLEMIA  IIA  . HYPERTENSION  . CAD, ARTERY BYPASS GRAFT  . BIGEMINY  . CAROTID ARTERY DISEASE  . ATHEROSCLEROSIS W /INT CLAUDICATION  . PVD  . DIABETES MELLITUS, BORDERLINE  . DIVERTICULITIS, HX OF  . Chest pain at rest  . Gout flare-right foot 02/08/12    Allergies  Allergen Reactions  . Penicillins Hives    Past Surgical History  Procedure Date  . Coronary artery bypass graft 1999  . Carotid endarterectomy 2009/ 1993    left/ right  . Cardiac catheterization 2006  . Eye surgery     eyelid repair  . Skin cancer excision     right ear x 3  . Inguinal hernia repair 02/11/2012    Procedure: LAPAROSCOPIC BILATERAL INGUINAL HERNIA REPAIR;  Surgeon: Pedro Earls, MD;  Location: [facility];  Service: General;  Laterality: Bilateral;  . Hernia repair 02/11/12    Bilateral inguinal hernia repair   FRIED, [name], MD No diagnosis found.  Incisions are bland.  He is doing very well.  Repair is intact.  He will return to "silver slippers" exercise group.   Return prn [name] Done, MD, [facility] [phone] beeper [phone]  02/27/2012 9:16 AM

## 2012-04-27 DIAGNOSIS — L570 Actinic keratosis: Secondary | ICD-10-CM | POA: Diagnosis not present

## 2012-04-27 DIAGNOSIS — Z85828 Personal history of other malignant neoplasm of skin: Secondary | ICD-10-CM | POA: Diagnosis not present

## 2012-04-27 DIAGNOSIS — D239 Other benign neoplasm of skin, unspecified: Secondary | ICD-10-CM | POA: Diagnosis not present

## 2012-04-27 DIAGNOSIS — L819 Disorder of pigmentation, unspecified: Secondary | ICD-10-CM | POA: Diagnosis not present

## 2012-04-27 DIAGNOSIS — L821 Other seborrheic keratosis: Secondary | ICD-10-CM | POA: Diagnosis not present

## 2012-05-24 ENCOUNTER — Ambulatory Visit (INDEPENDENT_AMBULATORY_CARE_PROVIDER_SITE_OTHER): Payer: Medicare Other | Admitting: Cardiovascular Disease

## 2012-05-24 ENCOUNTER — Encounter: Payer: Self-pay | Admitting: Cardiovascular Disease

## 2012-05-24 VITALS — BP 130/61 | HR 66 | Wt 160.0 lb

## 2012-05-24 DIAGNOSIS — E7800 Pure hypercholesterolemia, unspecified: Secondary | ICD-10-CM | POA: Diagnosis not present

## 2012-05-24 DIAGNOSIS — I25810 Atherosclerosis of coronary artery bypass graft(s) without angina pectoris: Secondary | ICD-10-CM

## 2012-05-24 DIAGNOSIS — I6529 Occlusion and stenosis of unspecified carotid artery: Secondary | ICD-10-CM | POA: Diagnosis not present

## 2012-05-24 DIAGNOSIS — I739 Peripheral vascular disease, unspecified: Secondary | ICD-10-CM | POA: Diagnosis not present

## 2012-05-24 DIAGNOSIS — I10 Essential (primary) hypertension: Secondary | ICD-10-CM | POA: Diagnosis not present

## 2012-05-24 NOTE — Assessment & Plan Note (Signed)
Well controlled.  Continue current medications and low sodium Dash type diet.    

## 2012-05-24 NOTE — Progress Notes (Signed)
Patient ID: [name], male   DOB: 12-31-1928, 79 y.o.   MRN: [id number] [name] is seen for F/U of CAD. 3/11 had SSCP and required cath. All of his grafts were patent and his pain has not recurred.  CABG: SVG: IM,OM1,OM2, SVG D1, SVG PDA/PLA and [place] to LAD EF was 45-50% with mild diffuse hypokinesis. Recently hospitalized for vague symptoms and elevated BP. R/O and BP improved on current regiman  Previous edema also improved with diuretic which has now been stopped He sees Dr Burt Knack for PVD. ABI 3/11 .88 on right. Carotid duplex with no significant stenosis S/P right CEA. Has mild claudication in left calf that he walks.  Seeing Dr Maceo Pro this month for blood work.  Had hernia surgery with [name] since I last saw him  06/16/11 LDL 62   ROS: Denies fever, malais, weight loss, blurry vision, decreased visual acuity, cough, sputum, SOB, hemoptysis, pleuritic pain, palpitaitons, heartburn, abdominal pain, melena, lower extremity edema, claudication, or rash.  All other systems reviewed and negative  General: Affect appropriate Healthy:  appears stated age 41: normal Neck supple with no adenopathy JVP normal right CEA with bruits no thyromegaly Lungs clear with no wheezing and good diaphragmatic motion Heart:  S1/S2 no murmur, no rub, gallop or click PMI normal Abdomen: benighn, BS positve, no tenderness, no AAA no bruit.  No HSM or HJR Distal pulses intact with no bruits No edema Neuro non-focal Skin warm and dry No muscular weakness   Current Outpatient Prescriptions  Medication Sig Dispense Refill  . allopurinol (ZYLOPRIM) 300 MG tablet Take 300 mg by mouth daily.      Marland Kitchen amLODipine (NORVASC) 10 MG tablet Take 5 mg by mouth daily after breakfast.       . aspirin 81 MG tablet Take 81 mg by mouth at bedtime. Last dose will be 02/05/12      . atorvastatin (LIPITOR) 20 MG tablet Take 20 mg by mouth at bedtime.      . bifidobacterium infantis (ALIGN) capsule Take 1 capsule by mouth  daily.      . carvedilol (COREG) 12.5 MG tablet Take 12.5 mg by mouth 2 (two) times daily with a meal.       . Cinnamon 500 MG TABS Take 2 tablets by mouth at bedtime. Last dose 02/05/12      . Coenzyme Q10 (CO Q 10) 100 MG CAPS Take 1 capsule by mouth daily. Will sop 10/10      . fish oil-omega-3 fatty acids 1000 MG capsule Take 1 g by mouth 2 (two) times daily. Will stop 02/05/12      . lisinopril (PRINIVIL,ZESTRIL) 20 MG tablet Take 10 mg by mouth daily after breakfast.       . Multiple Vitamin (MULITIVITAMIN WITH MINERALS) TABS Take 1 tablet by mouth daily. Will stop 02/05/12      . Multiple Vitamins-Minerals (PRESERVISION AREDS PO) Take 1 tablet by mouth 2 (two) times daily. Will stop 02/05/12      . nitroGLYCERIN (NITROSTAT) 0.4 MG SL tablet Place 0.4 mg under the tongue every 5 (five) minutes as needed. For chest pain      . OVER THE COUNTER MEDICATION Take 2 tablets by mouth 2 (two) times daily. GlucoCare      . oxyCODONE-acetaminophen (PERCOCET/ROXICET) 5-325 MG per tablet Take 1-2 tablets by mouth every 4 (four) hours as needed.  30 tablet  0  . Tamsulosin HCl (FLOMAX) 0.4 MG CAPS Take 0.4 mg by mouth daily  after supper.      . zinc gluconate 50 MG tablet Take 50 mg by mouth daily. Stop 02/05/12        Allergies  Penicillins  Electrocardiogram:  2/19  SR rate 70 RAD otherwise normal  Assessment and Plan

## 2012-05-24 NOTE — Assessment & Plan Note (Signed)
Stable with no angina and good activity level.  Continue medical Rx  

## 2012-05-24 NOTE — Patient Instructions (Addendum)
Your physician wants you to follow-up in: YEAR WITH DR NISHAN  You will receive a reminder letter in the mail two months in advance. If you don't receive a letter, please call our office to schedule the follow-up appointment.  Your physician recommends that you continue on your current medications as directed. Please refer to the Current Medication list given to you today. 

## 2012-05-24 NOTE — Assessment & Plan Note (Signed)
Cholesterol is at goal.  Continue current dose of statin and diet Rx.  No myalgias or side effects.  F/U  LFT's in 6 months. Lab Results  Component Value Date   LDLCALC 62 06/16/2011

## 2012-05-24 NOTE — Assessment & Plan Note (Signed)
F/U duplex in a year S/P right CEA

## 2012-05-24 NOTE — Assessment & Plan Note (Signed)
Stable claudication LLE with reasonable pulses to popliteal  ABI" s in a year or if sysmtoms changes

## 2012-05-27 DIAGNOSIS — H353190 Nonexudative age-related macular degeneration, unspecified eye, stage unspecified: Secondary | ICD-10-CM | POA: Diagnosis not present

## 2012-05-27 DIAGNOSIS — H40019 Open angle with borderline findings, low risk, unspecified eye: Secondary | ICD-10-CM | POA: Diagnosis not present

## 2012-05-28 DIAGNOSIS — H906 Mixed conductive and sensorineural hearing loss, bilateral: Secondary | ICD-10-CM | POA: Diagnosis not present

## 2012-06-12 ENCOUNTER — Other Ambulatory Visit: Payer: Self-pay

## 2012-07-05 DIAGNOSIS — E7800 Pure hypercholesterolemia, unspecified: Secondary | ICD-10-CM | POA: Diagnosis not present

## 2012-07-05 DIAGNOSIS — E119 Type 2 diabetes mellitus without complications: Secondary | ICD-10-CM | POA: Diagnosis not present

## 2012-07-05 DIAGNOSIS — M109 Gout, unspecified: Secondary | ICD-10-CM | POA: Diagnosis not present

## 2012-07-05 DIAGNOSIS — I10 Essential (primary) hypertension: Secondary | ICD-10-CM | POA: Diagnosis not present

## 2012-07-08 DIAGNOSIS — E119 Type 2 diabetes mellitus without complications: Secondary | ICD-10-CM | POA: Diagnosis not present

## 2012-07-08 DIAGNOSIS — I10 Essential (primary) hypertension: Secondary | ICD-10-CM | POA: Diagnosis not present

## 2012-07-08 DIAGNOSIS — E7800 Pure hypercholesterolemia, unspecified: Secondary | ICD-10-CM | POA: Diagnosis not present

## 2012-07-08 DIAGNOSIS — M109 Gout, unspecified: Secondary | ICD-10-CM | POA: Diagnosis not present

## 2012-07-15 DIAGNOSIS — H905 Unspecified sensorineural hearing loss: Secondary | ICD-10-CM | POA: Diagnosis not present

## 2012-07-15 DIAGNOSIS — H903 Sensorineural hearing loss, bilateral: Secondary | ICD-10-CM | POA: Diagnosis not present

## 2012-08-11 DIAGNOSIS — R1032 Left lower quadrant pain: Secondary | ICD-10-CM | POA: Diagnosis not present

## 2012-08-12 ENCOUNTER — Encounter (HOSPITAL_BASED_OUTPATIENT_CLINIC_OR_DEPARTMENT_OTHER): Payer: Self-pay

## 2012-08-12 ENCOUNTER — Ambulatory Visit (HOSPITAL_BASED_OUTPATIENT_CLINIC_OR_DEPARTMENT_OTHER)
Admission: RE | Admit: 2012-08-12 | Discharge: 2012-08-12 | Disposition: A | Discharge status: Home / Self Care | Payer: Medicare Other | Source: Ambulatory Visit | Attending: Family Medicine | Admitting: Family Medicine

## 2012-08-12 ENCOUNTER — Other Ambulatory Visit (HOSPITAL_BASED_OUTPATIENT_CLINIC_OR_DEPARTMENT_OTHER): Payer: Self-pay | Admitting: Family Medicine

## 2012-08-12 DIAGNOSIS — R1032 Left lower quadrant pain: Secondary | ICD-10-CM

## 2012-08-12 DIAGNOSIS — I10 Essential (primary) hypertension: Secondary | ICD-10-CM | POA: Insufficient documentation

## 2012-08-12 DIAGNOSIS — Q619 Cystic kidney disease, unspecified: Secondary | ICD-10-CM | POA: Diagnosis not present

## 2012-08-12 DIAGNOSIS — N400 Enlarged prostate without lower urinary tract symptoms: Secondary | ICD-10-CM | POA: Diagnosis not present

## 2012-08-12 DIAGNOSIS — Z951 Presence of aortocoronary bypass graft: Secondary | ICD-10-CM | POA: Diagnosis not present

## 2012-08-12 DIAGNOSIS — I2510 Atherosclerotic heart disease of native coronary artery without angina pectoris: Secondary | ICD-10-CM | POA: Diagnosis not present

## 2012-08-12 DIAGNOSIS — I739 Peripheral vascular disease, unspecified: Secondary | ICD-10-CM | POA: Insufficient documentation

## 2012-08-12 DIAGNOSIS — K5730 Diverticulosis of large intestine without perforation or abscess without bleeding: Secondary | ICD-10-CM | POA: Diagnosis not present

## 2012-08-12 MED ORDER — IOHEXOL 300 MG/ML  SOLN
80.0000 mL | Freq: Once | INTRAMUSCULAR | Status: AC | PRN
  Administered 2012-08-12: 80 mL via INTRAVENOUS

## 2012-11-04 DIAGNOSIS — I10 Essential (primary) hypertension: Secondary | ICD-10-CM | POA: Diagnosis not present

## 2012-11-05 DIAGNOSIS — L570 Actinic keratosis: Secondary | ICD-10-CM | POA: Diagnosis not present

## 2012-11-05 DIAGNOSIS — L821 Other seborrheic keratosis: Secondary | ICD-10-CM | POA: Diagnosis not present

## 2012-11-05 DIAGNOSIS — Z85828 Personal history of other malignant neoplasm of skin: Secondary | ICD-10-CM | POA: Diagnosis not present

## 2012-11-24 DIAGNOSIS — N183 Chronic kidney disease, stage 3 unspecified: Secondary | ICD-10-CM | POA: Diagnosis not present

## 2012-11-24 DIAGNOSIS — I10 Essential (primary) hypertension: Secondary | ICD-10-CM | POA: Diagnosis not present

## 2012-11-25 DIAGNOSIS — E119 Type 2 diabetes mellitus without complications: Secondary | ICD-10-CM | POA: Diagnosis not present

## 2012-11-25 DIAGNOSIS — H40019 Open angle with borderline findings, low risk, unspecified eye: Secondary | ICD-10-CM | POA: Diagnosis not present

## 2012-12-01 ENCOUNTER — Other Ambulatory Visit: Payer: Self-pay

## 2012-12-10 DIAGNOSIS — R42 Dizziness and giddiness: Secondary | ICD-10-CM | POA: Diagnosis not present

## 2012-12-10 DIAGNOSIS — I10 Essential (primary) hypertension: Secondary | ICD-10-CM | POA: Diagnosis not present

## 2012-12-24 DIAGNOSIS — H35369 Drusen (degenerative) of macula, unspecified eye: Secondary | ICD-10-CM | POA: Diagnosis not present

## 2012-12-24 DIAGNOSIS — H35439 Paving stone degeneration of retina, unspecified eye: Secondary | ICD-10-CM | POA: Diagnosis not present

## 2012-12-24 DIAGNOSIS — H353190 Nonexudative age-related macular degeneration, unspecified eye, stage unspecified: Secondary | ICD-10-CM | POA: Diagnosis not present

## 2012-12-24 DIAGNOSIS — H43819 Vitreous degeneration, unspecified eye: Secondary | ICD-10-CM | POA: Diagnosis not present

## 2012-12-30 DIAGNOSIS — I499 Cardiac arrhythmia, unspecified: Secondary | ICD-10-CM | POA: Diagnosis not present

## 2012-12-30 DIAGNOSIS — I10 Essential (primary) hypertension: Secondary | ICD-10-CM | POA: Diagnosis not present

## 2013-01-03 ENCOUNTER — Ambulatory Visit (INDEPENDENT_AMBULATORY_CARE_PROVIDER_SITE_OTHER): Payer: Medicare Other | Admitting: Cardiovascular Disease

## 2013-01-03 ENCOUNTER — Encounter: Payer: Self-pay | Admitting: Cardiovascular Disease

## 2013-01-03 VITALS — BP 170/64 | HR 60 | Wt 162.0 lb

## 2013-01-03 DIAGNOSIS — I739 Peripheral vascular disease, unspecified: Secondary | ICD-10-CM | POA: Diagnosis not present

## 2013-01-03 DIAGNOSIS — I25810 Atherosclerosis of coronary artery bypass graft(s) without angina pectoris: Secondary | ICD-10-CM | POA: Diagnosis not present

## 2013-01-03 DIAGNOSIS — Z79899 Other long term (current) drug therapy: Secondary | ICD-10-CM

## 2013-01-03 DIAGNOSIS — I10 Essential (primary) hypertension: Secondary | ICD-10-CM

## 2013-01-03 DIAGNOSIS — R42 Dizziness and giddiness: Secondary | ICD-10-CM

## 2013-01-03 DIAGNOSIS — E7800 Pure hypercholesterolemia, unspecified: Secondary | ICD-10-CM

## 2013-01-03 DIAGNOSIS — I6529 Occlusion and stenosis of unspecified carotid artery: Secondary | ICD-10-CM

## 2013-01-03 DIAGNOSIS — I4891 Unspecified atrial fibrillation: Secondary | ICD-10-CM | POA: Diagnosis not present

## 2013-01-03 NOTE — Progress Notes (Signed)
Patient ID: [name], male   DOB: [birth year], 79 y.o.   MRN: [id number] Tip is seen for F/U of CAD. 3/11 had SSCP and required cath. All of his grafts were patent and his pain has not recurred.   CABG 1999: SVG: IM,OM1,OM2, SVG D1, SVG PDA/PLA and Lima to LAD EF was 45-50% with mild diffuse hypokinesis  . He sees Dr Burt Knack for PVD. ABI 3/11 .88 on right. Carotid duplex with no significant stenosis S/P right CEA. Has mild claudication in left calf that he walks. Seeing Dr Maceo Pro this month for blood work. Had hernia surgery with [name] since I last saw him   06/16/11 LDL 62   Two weeks ago had episode golfing with visual distrubance and dizzyness.  Noted irregularity of pulse and BP lower readings.  Saw [facility] [place] who could not document arrhythia but thought BP low and cut lisinopril form 20 to 5 mg and lowered coreg.  Had another episode of weakness and funny vision last week and home BP readings continued to show irregular pulse.  In our office ECG shows afib/flutter which is new diagnosis  ROS: Denies fever, malais, weight loss, blurry vision, decreased visual acuity, cough, sputum, SOB, hemoptysis, pleuritic pain, palpitaitons, heartburn, abdominal pain, melena, lower extremity edema, claudication, or rash.  All other systems reviewed and negative  General: Affect appropriate Healthy:  appears stated age 88: normal Neck supple with no adenopathy JVP normal right  bruits no thyromegaly previous CEA  Lungs clear with no wheezing and good diaphragmatic motion Heart:  S1/S2 no murmur, no rub, gallop or click PMI normal Abdomen: benighn, BS positve, no tenderness, no AAA no bruit.  No HSM or HJR Distal pulses intact with no bruits No edema Neuro non-focal Skin warm and dry No muscular weakness   Current Outpatient Prescriptions  Medication Sig Dispense Refill  . allopurinol (ZYLOPRIM) 300 MG tablet Take 300 mg by mouth daily.      Marland Kitchen aspirin 81 MG tablet Take 81  mg by mouth at bedtime. Last dose will be 02/05/12      . atorvastatin (LIPITOR) 20 MG tablet Take 20 mg by mouth at bedtime.      . carvedilol (COREG) 3.125 MG tablet Take 3.125 mg by mouth 2 (two) times daily with a meal.      . Cinnamon 500 MG TABS Take 2 tablets by mouth at bedtime. Last dose 02/05/12      . Coenzyme Q10 (CO Q 10) 100 MG CAPS Take 1 capsule by mouth daily. Will sop 10/10      . fish oil-omega-3 fatty acids 1000 MG capsule Take 1 g by mouth 2 (two) times daily. Will stop 02/05/12      . lisinopril (PRINIVIL,ZESTRIL) 5 MG tablet Take 5 mg by mouth daily.      . Multiple Vitamin (MULITIVITAMIN WITH MINERALS) TABS Take 1 tablet by mouth daily. Will stop 02/05/12      . Multiple Vitamins-Minerals (PRESERVISION AREDS PO) Take 1 tablet by mouth 2 (two) times daily. Will stop 02/05/12      . nitroGLYCERIN (NITROSTAT) 0.4 MG SL tablet Place 0.4 mg under the tongue every 5 (five) minutes as needed. For chest pain      . OVER THE COUNTER MEDICATION Take 2 tablets by mouth 2 (two) times daily. GlucoCare      . Probiotic Product (PROBIOTIC PO) Take 1 tablet by mouth daily.      . Tamsulosin HCl (FLOMAX) 0.4 MG  CAPS Take 0.4 mg by mouth daily after supper.      . zinc gluconate 50 MG tablet Take 50 mg by mouth daily. Stop 02/05/12       No current facility-administered medications for this visit.    Allergies  Penicillins  Electrocardiogram  :Afib rate 60 LPFB  Vs limb lead reversal   Assessment and Plan

## 2013-01-03 NOTE — Assessment & Plan Note (Signed)
Needs f/u duplex given previous CEA and ? TIA  Will get MRI of head as I need to make sure there has been no subacute CVA before starting anticoagulation

## 2013-01-03 NOTE — Assessment & Plan Note (Signed)
Stable f/u [place] No resting symptoms

## 2013-01-03 NOTE — Assessment & Plan Note (Signed)
I think BP readings have been spurious with afib.  Significant HTN in our office  Increase lisinopril back to 10mg  for now

## 2013-01-03 NOTE — Patient Instructions (Addendum)
Your physician recommends that you schedule a follow-up appointment in: NEXT AVAILABLE WITH  DR [facility] Your physician has recommended you make the following change in your medication: INCREASE  LISINOPRIL TO  10 MG    PENDING LABS  START  ELIQUIS   5 MG   Your physician recommends that you return for lab work in: [place]  PT PTT  [place] has requested that you have an echocardiogram. Echocardiography is a painless test that uses sound waves to create images of your heart. It provides your doctor with information about the size and shape of your heart and how well your heart's chambers and valves are working. This procedure takes approximately one hour. There are no restrictions for this procedure.

## 2013-01-03 NOTE — Assessment & Plan Note (Signed)
Long discussion with patient regarding diagnosis , natural history and Rx.  Appears that Dr Martinique was arranging event monitor of some sort.  Continue coreg for rate control.  If MRI no stroke / bleed will start Eliquis.  Needs BMET, CBC PLT PT and PTT today before starting.  Echo to assess EF and atrial sizes.  F/U in 4-6 weeks to discuss  [facility]

## 2013-01-03 NOTE — Assessment & Plan Note (Signed)
Cholesterol is at goal.  Continue current dose of statin and diet Rx.  No myalgias or side effects.  F/U  LFT's in 6 months. Lab Results  Component Value Date   LDLCALC 62 06/16/2011

## 2013-01-03 NOTE — Assessment & Plan Note (Signed)
Stable with no angina and good activity level.  Continue medical Rx  

## 2013-01-04 LAB — CBC WITH DIFFERENTIAL/PLATELET
Basophils Absolute: 0 10*3/uL (ref 0.0–0.1)
Eosinophils Absolute: 0.3 10*3/uL (ref 0.0–0.7)
HCT: 33.2 % — ABNORMAL LOW (ref 39.0–52.0)
Lymphs Abs: 1.8 10*3/uL (ref 0.7–4.0)
MCV: 90.3 fl (ref 78.0–100.0)
Monocytes Absolute: 0.6 10*3/uL (ref 0.1–1.0)
Platelets: 230 10*3/uL (ref 150.0–400.0)
RDW: 15.3 % — ABNORMAL HIGH (ref 11.5–14.6)

## 2013-01-04 LAB — BASIC METABOLIC PANEL
CO2: 26 mEq/L (ref 19–32)
Creatinine, Ser: 1.6 mg/dL — ABNORMAL HIGH (ref 0.4–1.5)
Sodium: 138 mEq/L (ref 135–145)

## 2013-01-04 LAB — APTT: aPTT: 28.5 s (ref 21.7–28.8)

## 2013-01-06 DIAGNOSIS — E7800 Pure hypercholesterolemia, unspecified: Secondary | ICD-10-CM | POA: Diagnosis not present

## 2013-01-06 DIAGNOSIS — Z23 Encounter for immunization: Secondary | ICD-10-CM | POA: Diagnosis not present

## 2013-01-06 DIAGNOSIS — N183 Chronic kidney disease, stage 3 unspecified: Secondary | ICD-10-CM | POA: Diagnosis not present

## 2013-01-06 DIAGNOSIS — I4891 Unspecified atrial fibrillation: Secondary | ICD-10-CM | POA: Diagnosis not present

## 2013-01-06 DIAGNOSIS — E119 Type 2 diabetes mellitus without complications: Secondary | ICD-10-CM | POA: Diagnosis not present

## 2013-01-06 DIAGNOSIS — M109 Gout, unspecified: Secondary | ICD-10-CM | POA: Diagnosis not present

## 2013-01-06 DIAGNOSIS — I10 Essential (primary) hypertension: Secondary | ICD-10-CM | POA: Diagnosis not present

## 2013-01-07 ENCOUNTER — Other Ambulatory Visit: Payer: Self-pay | Admitting: *Deleted

## 2013-01-07 ENCOUNTER — Telehealth: Payer: Self-pay | Admitting: Cardiovascular Disease

## 2013-01-07 DIAGNOSIS — I480 Paroxysmal atrial fibrillation: Secondary | ICD-10-CM

## 2013-01-07 DIAGNOSIS — R791 Abnormal coagulation profile: Secondary | ICD-10-CM

## 2013-01-07 NOTE — Telephone Encounter (Signed)
Call from [facility] from Dr. Martinique office.  She stated that the note sent over for the appointment with Dr. Johnsie Cancel, should not have stated that they were ordering a Monitor.  Dr. Martinique wanted Dr. Johnsie Cancel to order the monitor.

## 2013-01-11 ENCOUNTER — Ambulatory Visit (HOSPITAL_COMMUNITY)
Admission: RE | Admit: 2013-01-11 | Discharge: 2013-01-11 | Disposition: A | Discharge status: Home / Self Care | Payer: Medicare Other | Source: Ambulatory Visit | Attending: Cardiovascular Disease | Admitting: Cardiovascular Disease

## 2013-01-11 ENCOUNTER — Ambulatory Visit: Payer: Medicare Other

## 2013-01-11 DIAGNOSIS — R42 Dizziness and giddiness: Secondary | ICD-10-CM | POA: Insufficient documentation

## 2013-01-11 DIAGNOSIS — M47812 Spondylosis without myelopathy or radiculopathy, cervical region: Secondary | ICD-10-CM | POA: Insufficient documentation

## 2013-01-11 DIAGNOSIS — R791 Abnormal coagulation profile: Secondary | ICD-10-CM

## 2013-01-11 DIAGNOSIS — G319 Degenerative disease of nervous system, unspecified: Secondary | ICD-10-CM | POA: Diagnosis not present

## 2013-01-11 MED ORDER — GADOBENATE DIMEGLUMINE 529 MG/ML IV SOLN
8.0000 mL | Freq: Once | INTRAVENOUS | Status: AC
  Administered 2013-01-11: 8 mL via INTRAVENOUS

## 2013-01-12 NOTE — Telephone Encounter (Signed)
DR Barbara Cower MONITOR  ORDER ENTERED AND   MESSAGE SENT TO SCHEDULERS ./CY PT NOTIFIED  .[name]

## 2013-01-12 NOTE — Telephone Encounter (Signed)
DO YOU WANT A MONITOR IF SO I WILL  MAKE ARRANGEMENTS FOR PT TO HAVE  DONE./CY

## 2013-01-12 NOTE — Telephone Encounter (Signed)
Follow up   Please call [facility] concerning Monitor for pt.

## 2013-01-12 NOTE — Telephone Encounter (Signed)
Event monitor for PAF ok

## 2013-01-17 ENCOUNTER — Encounter: Payer: Self-pay | Admitting: *Deleted

## 2013-01-17 ENCOUNTER — Encounter (INDEPENDENT_AMBULATORY_CARE_PROVIDER_SITE_OTHER): Payer: Medicare Other

## 2013-01-17 ENCOUNTER — Other Ambulatory Visit: Payer: Medicare Other

## 2013-01-17 DIAGNOSIS — I4891 Unspecified atrial fibrillation: Secondary | ICD-10-CM | POA: Diagnosis not present

## 2013-01-17 DIAGNOSIS — I480 Paroxysmal atrial fibrillation: Secondary | ICD-10-CM

## 2013-01-17 NOTE — Progress Notes (Signed)
Patient ID: [name], male   DOB: Sep 25, 1928, 79 y.o.   MRN: [id number] E-Cardio verite 30 day cardiac event monitor applied to patient.

## 2013-01-19 ENCOUNTER — Ambulatory Visit: Payer: Medicare Other | Admitting: Cardiovascular Disease

## 2013-01-19 ENCOUNTER — Ambulatory Visit (HOSPITAL_COMMUNITY): Payer: Medicare Other | Attending: Cardiovascular Disease | Admitting: Radiology

## 2013-01-19 ENCOUNTER — Encounter (INDEPENDENT_AMBULATORY_CARE_PROVIDER_SITE_OTHER): Payer: Medicare Other

## 2013-01-19 DIAGNOSIS — I079 Rheumatic tricuspid valve disease, unspecified: Secondary | ICD-10-CM | POA: Diagnosis not present

## 2013-01-19 DIAGNOSIS — I059 Rheumatic mitral valve disease, unspecified: Secondary | ICD-10-CM | POA: Diagnosis not present

## 2013-01-19 DIAGNOSIS — I2510 Atherosclerotic heart disease of native coronary artery without angina pectoris: Secondary | ICD-10-CM | POA: Insufficient documentation

## 2013-01-19 DIAGNOSIS — I4891 Unspecified atrial fibrillation: Secondary | ICD-10-CM | POA: Diagnosis not present

## 2013-01-19 DIAGNOSIS — I25810 Atherosclerosis of coronary artery bypass graft(s) without angina pectoris: Secondary | ICD-10-CM

## 2013-01-19 DIAGNOSIS — R42 Dizziness and giddiness: Secondary | ICD-10-CM | POA: Diagnosis not present

## 2013-01-19 DIAGNOSIS — R079 Chest pain, unspecified: Secondary | ICD-10-CM

## 2013-01-19 DIAGNOSIS — E785 Hyperlipidemia, unspecified: Secondary | ICD-10-CM | POA: Insufficient documentation

## 2013-01-19 DIAGNOSIS — I679 Cerebrovascular disease, unspecified: Secondary | ICD-10-CM | POA: Insufficient documentation

## 2013-01-19 DIAGNOSIS — I379 Nonrheumatic pulmonary valve disorder, unspecified: Secondary | ICD-10-CM | POA: Insufficient documentation

## 2013-01-19 DIAGNOSIS — I10 Essential (primary) hypertension: Secondary | ICD-10-CM | POA: Diagnosis not present

## 2013-01-19 DIAGNOSIS — I6529 Occlusion and stenosis of unspecified carotid artery: Secondary | ICD-10-CM

## 2013-01-19 LAB — VON WILLEBRAND PANEL
Ristocetin Co-factor, Plasma: 217 % — ABNORMAL HIGH (ref 42–200)
Von Willebrand Antigen, Plasma: 218 % — ABNORMAL HIGH (ref 50–217)

## 2013-01-19 NOTE — Progress Notes (Signed)
Echocardiogram performed.  

## 2013-01-24 ENCOUNTER — Telehealth: Payer: Self-pay | Admitting: *Deleted

## 2013-01-24 NOTE — Telephone Encounter (Signed)
Patient called to request refill on Eliquis 5mg . He takes 0.5 tablets a day. He would like 2.5mg  tablet if available. His pharmacy is [facility] in [place].

## 2013-01-25 ENCOUNTER — Telehealth: Payer: Self-pay | Admitting: Cardiovascular Disease

## 2013-01-25 MED ORDER — APIXABAN 2.5 MG PO TABS: 2.5000 mg | ORAL_TABLET | Freq: Two times a day (BID) | ORAL | Status: DC

## 2013-01-25 NOTE — Telephone Encounter (Signed)
New Problem  Pt states the doctor gave him samples of eliquis. Pt states he either needs more samples of eliquis or a new Rx of it. Please call pt back

## 2013-01-25 NOTE — Telephone Encounter (Signed)
Refill eliquis

## 2013-01-25 NOTE — Telephone Encounter (Signed)
Returned call to patient no answer.LMTC. 

## 2013-01-31 NOTE — Telephone Encounter (Signed)
Returned call to patient no answer.Left message on personal voice mail No eliquis samples.Eliquis prescription sent to pharmacy on 01/25/13.Advised to call back if needed.

## 2013-02-04 ENCOUNTER — Encounter: Payer: Self-pay | Admitting: Cardiovascular Disease

## 2013-02-04 ENCOUNTER — Telehealth: Payer: Self-pay | Admitting: Cardiovascular Disease

## 2013-02-04 ENCOUNTER — Other Ambulatory Visit: Payer: Self-pay | Admitting: Cardiovascular Disease

## 2013-02-04 ENCOUNTER — Encounter: Payer: Self-pay | Admitting: *Deleted

## 2013-02-04 ENCOUNTER — Ambulatory Visit (INDEPENDENT_AMBULATORY_CARE_PROVIDER_SITE_OTHER): Payer: Medicare Other | Admitting: Cardiovascular Disease

## 2013-02-04 VITALS — BP 144/88 | HR 56 | Ht 68.0 in | Wt 160.8 lb

## 2013-02-04 DIAGNOSIS — T148XXA Other injury of unspecified body region, initial encounter: Secondary | ICD-10-CM

## 2013-02-04 DIAGNOSIS — Z01810 Encounter for preprocedural cardiovascular examination: Secondary | ICD-10-CM

## 2013-02-04 NOTE — Assessment & Plan Note (Signed)
F/U duplex in 6 months no high grade disease  TIA more likely form flutter/fib

## 2013-02-04 NOTE — Assessment & Plan Note (Signed)
PT/PTT ok but von willibrand labs abnormal Since he needs anticoagulation will have him see hematology to further assess No pathologic bleeding

## 2013-02-04 NOTE — Progress Notes (Signed)
Patient ID: [name], male   DOB: 06/08/1928, 79 y.o.   MRN: [id number]  Ratha is seen for F/U of CAD. 3/11 had SSCP and required cath. All of his grafts were patent and his pain has not recurred.  CABG 1999: SVG: IM,OM1,OM2, SVG D1, SVG PDA/PLA and Lima to LAD EF was 45-50% with mild diffuse hypokinesis  . He sees Dr Burt Knack for PVD. ABI 3/11 .88 on right. Carotid duplex with no significant stenosis S/P right CEA. Has mild claudication in left calf that he walks. Seeing Dr Maceo Pro this month for blood work  06/16/11 LDL 62   Two months ago had episode golfing with visual distrubance and dizzyness. Noted irregularity of pulse and BP lower readings. Saw [facility] [place] who could not document arrhythia but thought BP low and cut lisinopril form 20 to 5 mg and lowered coreg. Had another episode of weakness and funny vision last week and home BP readings continued to show irregular pulse. In our office ECG shows afib/flutter which is new diagnosis    Subsequent monitor shows persistatn flutter/fib  Started on Eliquis Has history of easy bruising and some of his Von Willibrand labs are abnormal. Have asked him to see Hematology since he needs anticoagulation. CT head and renal function normal.  Discussed risks and benefits of [facility] and he is willing to proceed in 2 weeks.   ROS: Denies fever, malais, weight loss, blurry vision, decreased visual acuity, cough, sputum, SOB, hemoptysis, pleuritic pain, palpitaitons, heartburn, abdominal pain, melena, lower extremity edema, claudication, or rash.  All other systems reviewed and negative  General: Affect appropriate Healthy:  appears stated age 59: normal Neck supple with no adenopathy JVP normal no bruits no thyromegaly Lungs clear with no wheezing and good diaphragmatic motion Heart:  S1/S2 no murmur, no rub, gallop or click PMI normal Abdomen: benighn, BS positve, no tenderness, no AAA no bruit.  No HSM or HJR Distal pulses intact with no  bruits No edema Neuro non-focal Skin warm and dry No muscular weakness   Current Outpatient Prescriptions  Medication Sig Dispense Refill  . allopurinol (ZYLOPRIM) 300 MG tablet Take 300 mg by mouth daily.      Marland Kitchen apixaban (ELIQUIS) 2.5 MG TABS tablet Take 1 tablet (2.5 mg total) by mouth 2 (two) times daily.  60 tablet  1  . aspirin 81 MG tablet Take 81 mg by mouth at bedtime. Last dose will be 02/05/12      . atorvastatin (LIPITOR) 20 MG tablet Take 20 mg by mouth at bedtime.      . carvedilol (COREG) 3.125 MG tablet Take 3.125 mg by mouth 2 (two) times daily with a meal.      . Cinnamon 500 MG TABS Take 2 tablets by mouth at bedtime. Last dose 02/05/12      . Coenzyme Q10 (CO Q 10) 100 MG CAPS Take 1 capsule by mouth daily. Will sop 10/10      . fish oil-omega-3 fatty acids 1000 MG capsule Take 1 g by mouth 2 (two) times daily. Will stop 02/05/12      . lisinopril (PRINIVIL,ZESTRIL) 5 MG tablet Take 5 mg by mouth daily.      . Multiple Vitamin (MULITIVITAMIN WITH MINERALS) TABS Take 1 tablet by mouth daily. Will stop 02/05/12      . Multiple Vitamins-Minerals (PRESERVISION AREDS PO) Take 1 tablet by mouth 2 (two) times daily. Will stop 02/05/12      . nitroGLYCERIN (NITROSTAT) 0.4 MG  SL tablet Place 0.4 mg under the tongue every 5 (five) minutes as needed. For chest pain      . OVER THE COUNTER MEDICATION Take 2 tablets by mouth 2 (two) times daily. GlucoCare      . Probiotic Product (PROBIOTIC PO) Take 1 tablet by mouth daily.      . Tamsulosin HCl (FLOMAX) 0.4 MG CAPS Take 0.4 mg by mouth daily after supper.      . zinc gluconate 50 MG tablet Take 50 mg by mouth daily. Stop 02/05/12       No current facility-administered medications for this visit.    Allergies  Penicillins  Electrocardiogram:  Atrial flutter nonspecific ST/T wave changes   Assessment and Plan

## 2013-02-04 NOTE — Assessment & Plan Note (Signed)
Cholesterol is at goal.  Continue current dose of statin and diet Rx.  No myalgias or side effects.  F/U  LFT's in 6 months. Lab Results  Component Value Date   LDLCALC 62 06/16/2011

## 2013-02-04 NOTE — Telephone Encounter (Signed)
PT NOTED  ON PAPERWORK THAT  ON 5  DIFFERENT MEDS  STATED STOP DATE  WAS   02-05-12  INFORMED PT   NO MEDS WERE CHANGED TO CONT NOT SURE  WHERE  DATE CAME FROM PT  VERBALIZED UNDERSTANDING./CY

## 2013-02-04 NOTE — Telephone Encounter (Signed)
New problem  Pt request clarification on the list of medications received during OV... Please assist.

## 2013-02-04 NOTE — Assessment & Plan Note (Signed)
Reviewed event monitor  In persistant flutter/fib  Discussed alternatives and risks Willing have [facility] Continue low dose coreg and eliquis.  Scheduled [place] at [facility] for 10/28 Orders writtin.  Hold coreg morning of procedure and take eliquis dose that morning

## 2013-02-04 NOTE — Assessment & Plan Note (Signed)
Well controlled.  Continue current medications and low sodium Dash type diet.    

## 2013-02-04 NOTE — Patient Instructions (Signed)
Your physician recommends that you schedule a follow-up appointment in: [place]  02/22/13 Your physician recommends that you continue on your current medications as directed. Please refer to the Current Medication list given to you today. Your physician recommends that you return for lab work in: [place] have been referred to HEMATOLOGY  FOR  BRUISING AND IS ON BLOOD THINNER

## 2013-02-04 NOTE — Assessment & Plan Note (Signed)
No claudication f/u Dr Burt Knack stable

## 2013-02-04 NOTE — Assessment & Plan Note (Signed)
Stable with no angina and good activity level.  Continue medical Rx  

## 2013-02-15 ENCOUNTER — Other Ambulatory Visit (INDEPENDENT_AMBULATORY_CARE_PROVIDER_SITE_OTHER): Payer: Medicare Other

## 2013-02-15 DIAGNOSIS — Z01810 Encounter for preprocedural cardiovascular examination: Secondary | ICD-10-CM

## 2013-02-15 LAB — CBC WITH DIFFERENTIAL/PLATELET
Basophils Relative: 0.1 % (ref 0.0–3.0)
Eosinophils Relative: 4.7 % (ref 0.0–5.0)
HCT: 33.2 % — ABNORMAL LOW (ref 39.0–52.0)
Lymphs Abs: 1.6 10*3/uL (ref 0.7–4.0)
MCV: 88.8 fl (ref 78.0–100.0)
Monocytes Absolute: 0.5 10*3/uL (ref 0.1–1.0)
Neutro Abs: 3.8 10*3/uL (ref 1.4–7.7)
RBC: 3.74 Mil/uL — ABNORMAL LOW (ref 4.22–5.81)
WBC: 6.2 10*3/uL (ref 4.5–10.5)

## 2013-02-15 LAB — BASIC METABOLIC PANEL
BUN: 32 mg/dL — ABNORMAL HIGH (ref 6–23)
Calcium: 8.6 mg/dL (ref 8.4–10.5)
Chloride: 106 mEq/L (ref 96–112)
GFR: 43.51 mL/min — ABNORMAL LOW (ref >60.00)
Glucose, Bld: 175 mg/dL — ABNORMAL HIGH (ref 70–99)
Sodium: 139 mEq/L (ref 135–145)

## 2013-02-22 ENCOUNTER — Ambulatory Visit (HOSPITAL_COMMUNITY)
Admission: RE | Admit: 2013-02-22 | Discharge: 2013-02-22 | Disposition: A | Discharge status: Home / Self Care | Payer: Medicare Other | Source: Ambulatory Visit | Attending: Cardiovascular Disease | Admitting: Cardiovascular Disease

## 2013-02-22 ENCOUNTER — Ambulatory Visit (HOSPITAL_COMMUNITY): Payer: Medicare Other | Admitting: Anesthesiology

## 2013-02-22 ENCOUNTER — Encounter (HOSPITAL_COMMUNITY): Payer: Medicare Other | Admitting: Anesthesiology

## 2013-02-22 ENCOUNTER — Encounter (HOSPITAL_COMMUNITY)
Admission: RE | Disposition: A | Discharge status: Home / Self Care | Payer: Self-pay | Source: Ambulatory Visit | Attending: Cardiovascular Disease

## 2013-02-22 ENCOUNTER — Encounter (HOSPITAL_COMMUNITY): Payer: Self-pay

## 2013-02-22 DIAGNOSIS — I10 Essential (primary) hypertension: Secondary | ICD-10-CM | POA: Insufficient documentation

## 2013-02-22 DIAGNOSIS — I2510 Atherosclerotic heart disease of native coronary artery without angina pectoris: Secondary | ICD-10-CM | POA: Diagnosis not present

## 2013-02-22 DIAGNOSIS — I739 Peripheral vascular disease, unspecified: Secondary | ICD-10-CM | POA: Diagnosis not present

## 2013-02-22 DIAGNOSIS — I4891 Unspecified atrial fibrillation: Secondary | ICD-10-CM | POA: Insufficient documentation

## 2013-02-22 HISTORY — DX: Type 2 diabetes mellitus without complications: E11.9

## 2013-02-22 HISTORY — PX: CARDIOVERSION: SHX1299

## 2013-02-22 SURGERY — CARDIOVERSION
Anesthesia: General

## 2013-02-22 MED ORDER — HYDRALAZINE HCL 20 MG/ML IJ SOLN
INTRAMUSCULAR | Status: AC
  Filled 2013-02-22: qty 1

## 2013-02-22 MED ORDER — PROPOFOL 10 MG/ML IV BOLUS
INTRAVENOUS | Status: DC | PRN
  Administered 2013-02-22: 80 mg via INTRAVENOUS

## 2013-02-22 MED ORDER — HYDRALAZINE HCL 20 MG/ML IJ SOLN
10.0000 mg | Freq: Once | INTRAMUSCULAR | Status: AC
  Administered 2013-02-22: 13:00:00 via INTRAVENOUS

## 2013-02-22 MED ORDER — HYDRALAZINE HCL 20 MG/ML IJ SOLN: 5.0000 mg | Freq: Once | INTRAMUSCULAR | Status: DC | PRN

## 2013-02-22 NOTE — Interval H&P Note (Signed)
History and Physical Interval Note:  02/22/2013 11:19 AM  [name]  has presented today for surgery, with the diagnosis of AFIB  The various methods of treatment have been discussed with the patient and family. After consideration of risks, benefits and other options for treatment, the patient has consented to  Procedure(s): CARDIOVERSION (N/A) as a surgical intervention .  The patient's history has been reviewed, patient examined, no change in status, stable for surgery.  I have reviewed the patient's chart and labs.  Questions were answered to the patient's satisfaction.     Jenkins Rouge

## 2013-02-22 NOTE — Transfer of Care (Signed)
Immediate Anesthesia Transfer of Care Note  Patient: [name]  Procedure(s) Performed: Procedure(s): CARDIOVERSION (N/A)  Patient Location: PACU and Endoscopy Unit  Anesthesia Type:General  Level of Consciousness: awake, alert , oriented and sedated  Airway & Oxygen Therapy: Patient Spontanous Breathing and Patient connected to nasal cannula oxygen  Post-op Assessment: Report given to PACU RN, Post -op Vital signs reviewed and stable and Patient moving all extremities  Post vital signs: Reviewed and stable  Complications: No apparent anesthesia complications

## 2013-02-22 NOTE — Preoperative (Signed)
Beta Blockers   Reason not to administer Beta Blockers:Not Applicable 

## 2013-02-22 NOTE — Progress Notes (Signed)
Patient's BP 198/72 Dr Johnsie Cancel called with verbal order to give Hydralazine 10 mg IV x1 then he can go home with SBP < 170's

## 2013-02-22 NOTE — H&P (View-Only) (Signed)
Patient ID: [name], male   DOB: Nov 18, 1928, 79 y.o.   MRN: [id number]  [name] is seen for F/U of CAD. 3/11 had SSCP and required cath. All of his grafts were patent and his pain has not recurred.  CABG 1999: SVG: IM,OM1,OM2, SVG D1, SVG PDA/PLA and Lima to LAD EF was 45-50% with mild diffuse hypokinesis  . He sees Dr [name] for PVD. ABI 3/11 .88 on right. Carotid duplex with no significant stenosis S/P right CEA. Has mild claudication in left calf that he walks. Seeing Dr [name] this month for blood work  06/16/11 LDL 62   Two months ago had episode golfing with visual distrubance and dizzyness. Noted irregularity of pulse and BP lower readings. Saw [facility] [name] who could not document arrhythia but thought BP low and cut lisinopril form 20 to 5 mg and lowered coreg. Had another episode of weakness and funny vision last week and home BP readings continued to show irregular pulse. In our office ECG shows afib/flutter which is new diagnosis    Subsequent monitor shows persistatn flutter/fib  Started on Eliquis Has history of easy bruising and some of his Von Willibrand labs are abnormal. Have asked him to see Hematology since he needs anticoagulation. CT head and renal function normal.  Discussed risks and benefits of [facility] and he is willing to proceed in 2 weeks.   ROS: Denies fever, malais, weight loss, blurry vision, decreased visual acuity, cough, sputum, SOB, hemoptysis, pleuritic pain, palpitaitons, heartburn, abdominal pain, melena, lower extremity edema, claudication, or rash.  All other systems reviewed and negative  General: Affect appropriate Healthy:  appears stated age 15: normal Neck supple with no adenopathy JVP normal no bruits no thyromegaly Lungs clear with no wheezing and good diaphragmatic motion Heart:  S1/S2 no murmur, no rub, gallop or click PMI normal Abdomen: benighn, BS positve, no tenderness, no AAA no bruit.  No HSM or HJR Distal pulses intact with no  bruits No edema Neuro non-focal Skin warm and dry No muscular weakness   Current Outpatient Prescriptions  Medication Sig Dispense Refill  . allopurinol (ZYLOPRIM) 300 MG tablet Take 300 mg by mouth daily.      Marland Kitchen apixaban (ELIQUIS) 2.5 MG TABS tablet Take 1 tablet (2.5 mg total) by mouth 2 (two) times daily.  60 tablet  1  . aspirin 81 MG tablet Take 81 mg by mouth at bedtime. Last dose will be 02/05/12      . atorvastatin (LIPITOR) 20 MG tablet Take 20 mg by mouth at bedtime.      . carvedilol (COREG) 3.125 MG tablet Take 3.125 mg by mouth 2 (two) times daily with a meal.      . Cinnamon 500 MG TABS Take 2 tablets by mouth at bedtime. Last dose 02/05/12      . Coenzyme Q10 (CO Q 10) 100 MG CAPS Take 1 capsule by mouth daily. Will sop 10/10      . fish oil-omega-3 fatty acids 1000 MG capsule Take 1 g by mouth 2 (two) times daily. Will stop 02/05/12      . lisinopril (PRINIVIL,ZESTRIL) 5 MG tablet Take 5 mg by mouth daily.      . Multiple Vitamin (MULITIVITAMIN WITH MINERALS) TABS Take 1 tablet by mouth daily. Will stop 02/05/12      . Multiple Vitamins-Minerals (PRESERVISION AREDS PO) Take 1 tablet by mouth 2 (two) times daily. Will stop 02/05/12      . nitroGLYCERIN (NITROSTAT) 0.4 MG  SL tablet Place 0.4 mg under the tongue every 5 (five) minutes as needed. For chest pain      . OVER THE COUNTER MEDICATION Take 2 tablets by mouth 2 (two) times daily. GlucoCare      . Probiotic Product (PROBIOTIC PO) Take 1 tablet by mouth daily.      . Tamsulosin HCl (FLOMAX) 0.4 MG CAPS Take 0.4 mg by mouth daily after supper.      . zinc gluconate 50 MG tablet Take 50 mg by mouth daily. Stop 02/05/12       No current facility-administered medications for this visit.    Allergies  Penicillins  Electrocardiogram:  Atrial flutter nonspecific ST/T wave changes   Assessment and Plan

## 2013-02-22 NOTE — CV Procedure (Signed)
[facility]:  On Rx Eliquis.  Afib rate 100  Propofol 80mg  given Single 120J biphasic shock converted to NSR rate 75 No immediate neurologic sequelae.    Jenkins Rouge

## 2013-02-22 NOTE — Anesthesia Preprocedure Evaluation (Addendum)
Anesthesia Evaluation  Patient identified by MRN, date of birth, ID band Patient awake    Reviewed: Allergy & Precautions, H&P , NPO status , Patient's Chart, lab work & pertinent test results, reviewed documented beta blocker date and time   Airway Mallampati: II  Neck ROM: Full    Dental   Pulmonary former smoker,  breath sounds clear to auscultation        Cardiovascular hypertension, Pt. on medications and Pt. on home beta blockers + CAD and + Peripheral Vascular Disease + dysrhythmias Atrial Fibrillation Rate:Normal     Neuro/Psych    GI/Hepatic   Endo/Other  diabetes, Well Controlled, Type 2  Renal/GU      Musculoskeletal   Abdominal   Peds  Hematology   Anesthesia Other Findings   Reproductive/Obstetrics                         Anesthesia Physical Anesthesia Plan  ASA: III  Anesthesia Plan: General   Post-op Pain Management:    Induction: Intravenous  Airway Management Planned: Natural Airway and Mask  Additional Equipment:   Intra-op Plan:   Post-operative Plan:   Informed Consent: I have reviewed the patients History and Physical, chart, labs and discussed the procedure including the risks, benefits and alternatives for the proposed anesthesia with the patient or authorized representative who has indicated his/her understanding and acceptance.   Dental Advisory Given  Plan Discussed with: CRNA, Surgeon and Anesthesiologist  Anesthesia Plan Comments:        Anesthesia Quick Evaluation

## 2013-02-22 NOTE — Progress Notes (Signed)
BP 177/62 Dr Johnsie Cancel came here to talk to patient, patient will take Lisinopril when he arrives home. Ok to discharge patient home

## 2013-02-23 ENCOUNTER — Telehealth: Payer: Self-pay | Admitting: Cardiovascular Disease

## 2013-02-23 ENCOUNTER — Encounter (HOSPITAL_COMMUNITY): Payer: Self-pay | Admitting: Cardiovascular Disease

## 2013-02-23 MED ORDER — LISINOPRIL 10 MG PO TABS: 10.0000 mg | ORAL_TABLET | Freq: Every day | ORAL | Status: DC

## 2013-02-23 NOTE — Telephone Encounter (Signed)
PER  DR NISHAN PT TO INCREASE  LISINOPRIL TO 10 MG    AND PT  WILL MONITOR   B/P PT  VERBALIZED UNDERSTANDING./CY

## 2013-02-23 NOTE — Telephone Encounter (Signed)
New message   Had cardioversion yesterday.  [facility] store does not have medication dr prescribed--they can order it.  Pt states that his bp is 160/59 today.  Since pharmacy do not have medication----will he still have to get it since his bp is coming down?

## 2013-02-23 NOTE — Anesthesia Postprocedure Evaluation (Signed)
  Anesthesia Post-op Note  Patient: [name]  Procedure(s) Performed: Procedure(s): CARDIOVERSION (N/A)  Patient Location: PACU and Endoscopy Unit  Anesthesia Type:General  Level of Consciousness: awake and alert   Airway and Oxygen Therapy: Patient Spontanous Breathing  Post-op Pain: mild  Post-op Assessment: Post-op Vital signs reviewed  Post-op Vital Signs: stable  Complications: No apparent anesthesia complications

## 2013-03-03 ENCOUNTER — Other Ambulatory Visit: Payer: Self-pay

## 2013-03-07 ENCOUNTER — Other Ambulatory Visit: Payer: Self-pay | Admitting: *Deleted

## 2013-03-07 ENCOUNTER — Telehealth: Payer: Self-pay | Admitting: *Deleted

## 2013-03-07 MED ORDER — APIXABAN 2.5 MG PO TABS: 2.5000 mg | ORAL_TABLET | Freq: Two times a day (BID) | ORAL | Status: DC

## 2013-03-07 NOTE — Telephone Encounter (Signed)
Patient wants eliquis to mail order

## 2013-04-04 ENCOUNTER — Ambulatory Visit (INDEPENDENT_AMBULATORY_CARE_PROVIDER_SITE_OTHER): Payer: Medicare Other | Admitting: Cardiovascular Disease

## 2013-04-04 ENCOUNTER — Encounter: Payer: Self-pay | Admitting: Cardiovascular Disease

## 2013-04-04 VITALS — BP 142/82 | HR 76 | Wt 160.8 lb

## 2013-04-04 DIAGNOSIS — I739 Peripheral vascular disease, unspecified: Secondary | ICD-10-CM | POA: Diagnosis not present

## 2013-04-04 DIAGNOSIS — I25810 Atherosclerosis of coronary artery bypass graft(s) without angina pectoris: Secondary | ICD-10-CM

## 2013-04-04 DIAGNOSIS — I10 Essential (primary) hypertension: Secondary | ICD-10-CM

## 2013-04-04 DIAGNOSIS — I6529 Occlusion and stenosis of unspecified carotid artery: Secondary | ICD-10-CM

## 2013-04-04 DIAGNOSIS — I4891 Unspecified atrial fibrillation: Secondary | ICD-10-CM

## 2013-04-04 MED ORDER — LISINOPRIL 20 MG PO TABS: 20.0000 mg | ORAL_TABLET | Freq: Every day | ORAL | Status: DC

## 2013-04-04 NOTE — Assessment & Plan Note (Signed)
S/P [facility] NSR continue Eliquis

## 2013-04-04 NOTE — Assessment & Plan Note (Signed)
Increase lisinopril to 20 mg and take staggered with coreg F/U next available appt May need to add diuretic

## 2013-04-04 NOTE — Assessment & Plan Note (Signed)
Limited claudication No skin lesions fu ABI's in 6 months

## 2013-04-04 NOTE — Assessment & Plan Note (Signed)
Stable with no angina and good activity level.  Continue medical Rx  

## 2013-04-04 NOTE — Assessment & Plan Note (Signed)
Stable f/u duplex in a year RCEA  ASA

## 2013-04-04 NOTE — Patient Instructions (Signed)
Your physician recommends that you schedule a follow-up appointment in: NEXT  AVAILABLE WITH  DR [facility] Your physician has recommended you make the following change in your medication:  INCREASE LISINOPRIL TO 20 MG EVERY DAY

## 2013-04-04 NOTE — Progress Notes (Signed)
Patient ID: [name], male   DOB: 1929/01/19, 79 y.o.   MRN: [id number] [name] is seen for F/U of CAD. 3/11 had SSCP and required cath. All of his grafts were patent and his pain has not recurred.  CABG 1999: SVG: IM,OM1,OM2, SVG D1, SVG PDA/PLA and [place] to LAD EF was 45-50% with mild diffuse hypokinesis  . He sees Dr Burt Knack for PVD. ABI 3/11 .88 on right. Carotid duplex with no significant stenosis S/P right CEA. Has mild claudication in left calf that he walks. Seeing Dr Maceo Pro this month for blood work  06/16/11 LDL 62  Two months ago had episode golfing with visual distrubance and dizzyness. Noted irregularity of pulse and BP lower readings. Saw [facility] [place] who could not document arrhythia but thought BP low and cut lisinopril form 20 to 5 mg and lowered coreg. Had another episode of weakness and funny vision last week and home BP readings continued to show irregular pulse. In our office ECG shows afib/flutter which is new diagnosis  Subsequent monitor shows persistatn flutter/fib Started on Eliquis Has history of easy bruising and some of his [place] labs are abnormal. Have asked him to see Hematology since he needs anticoagulation. CT head and renal function normal.   DCC done 02/22/13 and maint NSR  BP been high Had increased lisinopril ot 10 mg but still high   ROS: Denies fever, malais, weight loss, blurry vision, decreased visual acuity, cough, sputum, SOB, hemoptysis, pleuritic pain, palpitaitons, heartburn, abdominal pain, melena, lower extremity edema, claudication, or rash.  All other systems reviewed and negative  General: Affect appropriate Healthy:  appears stated age 14: normal Neck supple with no adenopathy JVP normal right CEA with  bruits no thyromegaly Lungs clear with no wheezing and good diaphragmatic motion Heart:  S1/S2 no murmur, no rub, gallop or click PMI normal Abdomen: benighn, BS positve, no tenderness, no AAA no bruit.  No HSM or  HJR Distal pulses intact with no bruits No edema Neuro non-focal Skin warm and dry No muscular weakness   Current Outpatient Prescriptions  Medication Sig Dispense Refill  . allopurinol (ZYLOPRIM) 300 MG tablet Take 300 mg by mouth daily.      Marland Kitchen apixaban (ELIQUIS) 2.5 MG TABS tablet Take 1 tablet (2.5 mg total) by mouth 2 (two) times daily.  180 tablet  3  . aspirin 81 MG tablet Take 81 mg by mouth at bedtime. Last dose will be 02/05/12      . atorvastatin (LIPITOR) 20 MG tablet Take 20 mg by mouth at bedtime.      . carvedilol (COREG) 3.125 MG tablet Take 6.25 mg by mouth 2 (two) times daily with a meal.       . Cinnamon 500 MG TABS Take 2 tablets by mouth at bedtime. Last dose 02/05/12      . Coenzyme Q10 (CO Q 10) 100 MG CAPS Take 1 capsule by mouth daily. Will sop 10/10      . fish oil-omega-3 fatty acids 1000 MG capsule Take 1 g by mouth 2 (two) times daily. Will stop 02/05/12      . hydrALAZINE (APRESOLINE) 20 MG/ML injection Inject 0.25 mLs (5 mg total) into the vein once as needed.  1 mL  0  . lisinopril (PRINIVIL,ZESTRIL) 10 MG tablet Take 1 tablet (10 mg total) by mouth daily.      . Multiple Vitamin (MULITIVITAMIN WITH MINERALS) TABS Take 1 tablet by mouth daily. Will stop 02/05/12      .  Multiple Vitamins-Minerals (PRESERVISION AREDS PO) Take 1 tablet by mouth 2 (two) times daily. Will stop 02/05/12      . nitroGLYCERIN (NITROSTAT) 0.4 MG SL tablet Place 0.4 mg under the tongue every 5 (five) minutes as needed. For chest pain      . OVER THE COUNTER MEDICATION Take 2 tablets by mouth 2 (two) times daily. GlucoCare      . Probiotic Product (PROBIOTIC PO) Take 1 tablet by mouth daily.      . Tamsulosin HCl (FLOMAX) 0.4 MG CAPS Take 0.4 mg by mouth daily after supper.      . zinc gluconate 50 MG tablet Take 50 mg by mouth daily. Stop 02/05/12       No current facility-administered medications for this visit.    Allergies  Penicillins  Electrocardiogram:  9/22 afib  nonspecific ST/T wave changes   Assessment and Plan

## 2013-04-16 DIAGNOSIS — R030 Elevated blood-pressure reading, without diagnosis of hypertension: Secondary | ICD-10-CM | POA: Diagnosis not present

## 2013-04-16 DIAGNOSIS — R05 Cough: Secondary | ICD-10-CM | POA: Diagnosis not present

## 2013-04-16 DIAGNOSIS — R059 Cough, unspecified: Secondary | ICD-10-CM | POA: Diagnosis not present

## 2013-04-16 DIAGNOSIS — J019 Acute sinusitis, unspecified: Secondary | ICD-10-CM | POA: Diagnosis not present

## 2013-05-09 ENCOUNTER — Other Ambulatory Visit: Payer: Self-pay | Admitting: Dermatology

## 2013-05-09 DIAGNOSIS — D485 Neoplasm of uncertain behavior of skin: Secondary | ICD-10-CM | POA: Diagnosis not present

## 2013-05-09 DIAGNOSIS — Z85828 Personal history of other malignant neoplasm of skin: Secondary | ICD-10-CM | POA: Diagnosis not present

## 2013-05-09 DIAGNOSIS — C44621 Squamous cell carcinoma of skin of unspecified upper limb, including shoulder: Secondary | ICD-10-CM | POA: Diagnosis not present

## 2013-05-09 DIAGNOSIS — L570 Actinic keratosis: Secondary | ICD-10-CM | POA: Diagnosis not present

## 2013-06-03 ENCOUNTER — Encounter: Payer: Self-pay | Admitting: Cardiovascular Disease

## 2013-06-03 ENCOUNTER — Ambulatory Visit (INDEPENDENT_AMBULATORY_CARE_PROVIDER_SITE_OTHER): Payer: Medicare Other | Admitting: Cardiovascular Disease

## 2013-06-03 VITALS — BP 118/50 | HR 88 | Ht 68.0 in | Wt 162.1 lb

## 2013-06-03 DIAGNOSIS — I10 Essential (primary) hypertension: Secondary | ICD-10-CM | POA: Diagnosis not present

## 2013-06-03 DIAGNOSIS — I6529 Occlusion and stenosis of unspecified carotid artery: Secondary | ICD-10-CM | POA: Diagnosis not present

## 2013-06-03 DIAGNOSIS — E785 Hyperlipidemia, unspecified: Secondary | ICD-10-CM

## 2013-06-03 DIAGNOSIS — Z79899 Other long term (current) drug therapy: Secondary | ICD-10-CM

## 2013-06-03 DIAGNOSIS — I739 Peripheral vascular disease, unspecified: Secondary | ICD-10-CM

## 2013-06-03 NOTE — Assessment & Plan Note (Signed)
Maint NSR since [facility] 10/14  No palpitations continue anticoagulation

## 2013-06-03 NOTE — Assessment & Plan Note (Signed)
Well controlled.  Continue current medications and low sodium Dash type diet.    

## 2013-06-03 NOTE — Assessment & Plan Note (Signed)
Mild ABI .88 no claudication f/u Dr Burt Knack

## 2013-06-03 NOTE — Assessment & Plan Note (Signed)
No critical disease on duplex 9/14  ASA  F/u duplex 6 months

## 2013-06-03 NOTE — Assessment & Plan Note (Signed)
Stable with no angina and good activity level.  Continue medical Rx  

## 2013-06-03 NOTE — Progress Notes (Signed)
Patient ID: [name], male   DOB: [birth year], 80 y.o.   MRN: [id number] Garrie is seen for F/U of CAD. 3/11 had SSCP and required cath. All of his grafts were patent and his pain has not recurred.  CABG 1999: SVG: IM,OM1,OM2, SVG D1, SVG PDA/PLA and [place] to LAD EF was 45-50% with mild diffuse hypokinesis  . He sees Dr Burt Knack for PVD. ABI 3/11 .88 on right. Carotid duplex with no significant stenosis S/P right CEA. Has mild claudication in left calf that he walks.  10/14 wo months ago had episode golfing with visual distrubance and dizzyness. Noted irregularity of pulse and BP lower readings. Saw [facility] [place] who could not document arrhythia but thought BP low and cut lisinopril form 20 to 5 mg and lowered coreg. Had another episode of weakness and funny vision last week and home BP readings continued to show irregular pulse. In our office ECG shows afib/flutter which is new diagnosis  Subsequent monitor shows persistatn flutter/fib Started on Eliquis Has history of easy bruising and some of his [place] labs are abnormal. Have asked him to see Hematology since he needs anticoagulation. CT head and renal function normal.  DCC done 01/27/13  and maint NSR    ROS: Denies fever, malais, weight loss, blurry vision, decreased visual acuity, cough, sputum, SOB, hemoptysis, pleuritic pain, palpitaitons, heartburn, abdominal pain, melena, lower extremity edema, claudication, or rash.  All other systems reviewed and negative  General: Affect appropriate Healthy:  appears stated age 30: normal Neck supple with no adenopathy JVP normal bilateral  bruits no thyromegaly Lungs clear with no wheezing and good diaphragmatic motion Heart:  S1/S2 mild MR  murmur, no rub, gallop or click PMI normal Abdomen: benighn, BS positve, no tenderness, no AAA no bruit.  No HSM or HJR Distal pulses intact with no bruits No edema Neuro non-focal Skin warm and dry No muscular weakness   Current  Outpatient Prescriptions  Medication Sig Dispense Refill  . allopurinol (ZYLOPRIM) 300 MG tablet Take 300 mg by mouth daily.      Marland Kitchen apixaban (ELIQUIS) 2.5 MG TABS tablet Take 1 tablet (2.5 mg total) by mouth 2 (two) times daily.  180 tablet  3  . aspirin 81 MG tablet Take 81 mg by mouth at bedtime. Last dose will be 02/05/12      . atorvastatin (LIPITOR) 20 MG tablet Take 20 mg by mouth at bedtime.      . carvedilol (COREG) 3.125 MG tablet Take 6.25 mg by mouth 2 (two) times daily with a meal.       . Cinnamon 500 MG TABS Take 2 tablets by mouth at bedtime. Last dose 02/05/12      . Coenzyme Q10 (CO Q 10) 100 MG CAPS Take 1 capsule by mouth daily. Will sop 10/10      . fish oil-omega-3 fatty acids 1000 MG capsule Take 1 g by mouth 2 (two) times daily. Will stop 02/05/12      . lisinopril (PRINIVIL,ZESTRIL) 20 MG tablet Take 1 tablet (20 mg total) by mouth daily.  90 tablet  3  . Multiple Vitamins-Minerals (PRESERVISION AREDS PO) Take 1 tablet by mouth 2 (two) times daily. Will stop 02/05/12      . nitroGLYCERIN (NITROSTAT) 0.4 MG SL tablet Place 0.4 mg under the tongue every 5 (five) minutes as needed. For chest pain      . OVER THE COUNTER MEDICATION Take 2 tablets by mouth 2 (two) times daily.  GlucoCare      . Probiotic Product (PROBIOTIC PO) Take 1 tablet by mouth daily.      . Tamsulosin HCl (FLOMAX) 0.4 MG CAPS Take 0.4 mg by mouth daily after supper.      . zinc gluconate 50 MG tablet Take 50 mg by mouth daily. Stop 02/05/12       No current facility-administered medications for this visit.    Allergies  Penicillins  Electrocardiogram:  02/13/13  afib rate 60   Assessment and Plan

## 2013-06-03 NOTE — Patient Instructions (Signed)
Your physician wants you to follow-up in:   [place] will receive a reminder letter in the mail two months in advance. If you don't receive a letter, please call our office to schedule the follow-up appointment. Your physician recommends that you continue on your current medications as directed. Please refer to the Current Medication list given to you today. Your physician recommends that you return for lab work in: [place]

## 2013-06-09 ENCOUNTER — Other Ambulatory Visit (INDEPENDENT_AMBULATORY_CARE_PROVIDER_SITE_OTHER): Payer: Medicare Other

## 2013-06-09 DIAGNOSIS — E785 Hyperlipidemia, unspecified: Secondary | ICD-10-CM

## 2013-06-09 DIAGNOSIS — Z79899 Other long term (current) drug therapy: Secondary | ICD-10-CM | POA: Diagnosis not present

## 2013-06-09 LAB — HEPATIC FUNCTION PANEL
ALT: 22 U/L (ref 0–53)
AST: 25 U/L (ref 0–37)
Albumin: 3.8 g/dL (ref 3.5–5.2)
Alkaline Phosphatase: 52 U/L (ref 39–117)
BILIRUBIN DIRECT: 0.1 mg/dL (ref 0.0–0.3)
Total Bilirubin: 1.2 mg/dL (ref 0.3–1.2)
Total Protein: 6.8 g/dL (ref 6.0–8.3)

## 2013-06-09 LAB — LIPID PANEL
CHOL/HDL RATIO: 3
Cholesterol: 112 mg/dL (ref 0–200)
HDL: 43.2 mg/dL (ref >39.00)
LDL Cholesterol: 55 mg/dL (ref 0–99)
Triglycerides: 69 mg/dL (ref 0.0–149.0)
VLDL: 13.8 mg/dL (ref 0.0–40.0)

## 2013-06-10 ENCOUNTER — Other Ambulatory Visit: Payer: Medicare Other

## 2013-06-22 DIAGNOSIS — H43819 Vitreous degeneration, unspecified eye: Secondary | ICD-10-CM | POA: Diagnosis not present

## 2013-06-22 DIAGNOSIS — H35369 Drusen (degenerative) of macula, unspecified eye: Secondary | ICD-10-CM | POA: Diagnosis not present

## 2013-06-22 DIAGNOSIS — H2510 Age-related nuclear cataract, unspecified eye: Secondary | ICD-10-CM | POA: Diagnosis not present

## 2013-06-22 DIAGNOSIS — H353190 Nonexudative age-related macular degeneration, unspecified eye, stage unspecified: Secondary | ICD-10-CM | POA: Diagnosis not present

## 2013-07-07 DIAGNOSIS — M109 Gout, unspecified: Secondary | ICD-10-CM | POA: Diagnosis not present

## 2013-07-07 DIAGNOSIS — N183 Chronic kidney disease, stage 3 unspecified: Secondary | ICD-10-CM | POA: Diagnosis not present

## 2013-07-07 DIAGNOSIS — E7800 Pure hypercholesterolemia, unspecified: Secondary | ICD-10-CM | POA: Diagnosis not present

## 2013-07-07 DIAGNOSIS — I10 Essential (primary) hypertension: Secondary | ICD-10-CM | POA: Diagnosis not present

## 2013-07-07 DIAGNOSIS — E119 Type 2 diabetes mellitus without complications: Secondary | ICD-10-CM | POA: Diagnosis not present

## 2013-08-04 DIAGNOSIS — N183 Chronic kidney disease, stage 3 unspecified: Secondary | ICD-10-CM | POA: Diagnosis not present

## 2013-09-16 DIAGNOSIS — L819 Disorder of pigmentation, unspecified: Secondary | ICD-10-CM | POA: Diagnosis not present

## 2013-09-16 DIAGNOSIS — L570 Actinic keratosis: Secondary | ICD-10-CM | POA: Diagnosis not present

## 2013-09-16 DIAGNOSIS — D692 Other nonthrombocytopenic purpura: Secondary | ICD-10-CM | POA: Diagnosis not present

## 2013-09-16 DIAGNOSIS — L821 Other seborrheic keratosis: Secondary | ICD-10-CM | POA: Diagnosis not present

## 2013-09-16 DIAGNOSIS — Z85828 Personal history of other malignant neoplasm of skin: Secondary | ICD-10-CM | POA: Diagnosis not present

## 2013-10-17 ENCOUNTER — Emergency Department (HOSPITAL_COMMUNITY)
Admission: EM | Admit: 2013-10-17 | Discharge: 2013-10-17 | Disposition: A | Discharge status: Home / Self Care | Payer: Medicare Other | Attending: Emergency Medicine | Admitting: Emergency Medicine

## 2013-10-17 ENCOUNTER — Encounter (HOSPITAL_COMMUNITY): Payer: Self-pay | Admitting: Emergency Medicine

## 2013-10-17 DIAGNOSIS — IMO0001 Reserved for inherently not codable concepts without codable children: Secondary | ICD-10-CM | POA: Insufficient documentation

## 2013-10-17 DIAGNOSIS — Z7902 Long term (current) use of antithrombotics/antiplatelets: Secondary | ICD-10-CM | POA: Insufficient documentation

## 2013-10-17 DIAGNOSIS — E119 Type 2 diabetes mellitus without complications: Secondary | ICD-10-CM | POA: Diagnosis not present

## 2013-10-17 DIAGNOSIS — I2510 Atherosclerotic heart disease of native coronary artery without angina pectoris: Secondary | ICD-10-CM | POA: Insufficient documentation

## 2013-10-17 DIAGNOSIS — Z7982 Long term (current) use of aspirin: Secondary | ICD-10-CM | POA: Insufficient documentation

## 2013-10-17 DIAGNOSIS — R109 Unspecified abdominal pain: Secondary | ICD-10-CM | POA: Insufficient documentation

## 2013-10-17 DIAGNOSIS — Z9889 Other specified postprocedural states: Secondary | ICD-10-CM | POA: Insufficient documentation

## 2013-10-17 DIAGNOSIS — Z880 Allergy status to penicillin: Secondary | ICD-10-CM | POA: Diagnosis not present

## 2013-10-17 DIAGNOSIS — Z8719 Personal history of other diseases of the digestive system: Secondary | ICD-10-CM | POA: Insufficient documentation

## 2013-10-17 DIAGNOSIS — I10 Essential (primary) hypertension: Secondary | ICD-10-CM | POA: Insufficient documentation

## 2013-10-17 DIAGNOSIS — Z951 Presence of aortocoronary bypass graft: Secondary | ICD-10-CM | POA: Insufficient documentation

## 2013-10-17 DIAGNOSIS — M79609 Pain in unspecified limb: Secondary | ICD-10-CM | POA: Diagnosis not present

## 2013-10-17 DIAGNOSIS — M791 Myalgia, unspecified site: Secondary | ICD-10-CM

## 2013-10-17 DIAGNOSIS — E785 Hyperlipidemia, unspecified: Secondary | ICD-10-CM | POA: Diagnosis not present

## 2013-10-17 DIAGNOSIS — Z87891 Personal history of nicotine dependence: Secondary | ICD-10-CM | POA: Diagnosis not present

## 2013-10-17 MED ORDER — IBUPROFEN 200 MG PO TABS
400.0000 mg | ORAL_TABLET | Freq: Once | ORAL | Status: AC
  Administered 2013-10-17: 400 mg via ORAL
  Filled 2013-10-17: qty 2

## 2013-10-17 NOTE — ED Notes (Signed)
Family at bedside. 

## 2013-10-17 NOTE — ED Notes (Signed)
MD at bedside. EDP BELFI PRESENT TO EVALUATE THIS PT

## 2013-10-17 NOTE — ED Notes (Signed)
PT IN RESTROOM

## 2013-10-17 NOTE — Discharge Instructions (Signed)

## 2013-10-17 NOTE — ED Notes (Addendum)
Pt c/o left leg pain since Saturday, did exercise thought it may have been a pulled muscle but pain has not subsided so pt thinks it may be a blood clot.  Pain is in thigh to knee.

## 2013-10-17 NOTE — ED Notes (Addendum)
Pt had additional BP medication he taken a noon. Pt denies any symptoms with elevated BP. EDP BELFI aware of BP. No changes

## 2013-10-17 NOTE — Progress Notes (Signed)
*  PRELIMINARY RESULTS* Vascular Ultrasound Left lower extremity venous duplex has been completed.  Preliminary findings: No evidence of DVT or baker's cyst.   Landry Mellow, RDMS, RVT  10/17/2013, 10:14 AM

## 2013-10-17 NOTE — ED Provider Notes (Signed)
CSN: [id number]     Arrival date & time 10/17/13  [id number] History   First MD Initiated Contact with Patient 10/17/13 [phone]     Chief Complaint  Patient presents with  . Leg Pain     (Consider location/radiation/quality/duration/timing/severity/associated sxs/prior Treatment) HPI Comments: Patient presents with left flank pain. It's been hurting for the last 2 days. It started after he did a [employer] sne[employer] exercise class. He describes as a soreness across the top of his left thigh down toward his knee. It's worse with movements. He says actually gets better after he walks for a while but when he woke up this morning it was worse than it had been. He denies any swelling of his leg. He denies any pain in his back or hip. He's worried that he might have a blood clot. 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 He has a followup ultrasound in July to reassess this. He was first diagnosed with this when he was having claudication symptoms of pain in his calves after walking. He's currently not having any of these symptoms.  Patient is a 80 y.o. male presenting with leg pain.  Leg Pain Associated symptoms: no back pain, no fatigue and no fever     Past Medical History  Diagnosis Date  . Coronary artery disease     post bypass  . Hypertension   . PVD (peripheral vascular disease)   . CVD (cardiovascular disease)   . Hyperlipidemia   . Diverticular disease   . Bigeminal rhythm   . Hernia   . Easy bruising   . Diabetes mellitus without complication    Past Surgical History  Procedure Laterality Date  . Coronary artery bypass graft  1999  . Carotid endarterectomy  2009/ 1993    left/ right  . Cardiac catheterization  2006  . Eye surgery      eyelid repair  . Skin cancer excision      right ear x 3  . Inguinal hernia repair  02/11/2012    Procedure: LAPAROSCOPIC BILATERAL INGUINAL HERNIA REPAIR;  Surgeon: Pedro Earls,  MD;  Location: [facility] ORS;  Service: General;  Laterality: Bilateral;  . Hernia repair  02/11/12    Bilateral inguinal hernia repair  . Cardioversion N/A 02/22/2013    Procedure: CARDIOVERSION;  Surgeon: [name], MD;  Location: [facility] ENDOSCOPY;  Service: Cardiovascular;  Laterality: N/A;   Family History  Problem Relation Age of Onset  . Coronary artery disease Father   . Stroke Mother    History  Substance Use Topics  . Smoking status: Former Smoker    Quit date: 12/25/1961  . Smokeless tobacco: Former Systems developer    Types: Chew    Quit date: 02/03/1969  . Alcohol Use: No    Review of Systems  Constitutional: Negative for fever, chills, diaphoresis and fatigue.  HENT: Negative for congestion, rhinorrhea and sneezing.   Eyes: Negative.   Respiratory: Negative for cough, chest tightness and shortness of breath.   Cardiovascular: Negative for chest pain and leg swelling.  Gastrointestinal: Negative for nausea, vomiting, abdominal pain, diarrhea and blood in stool.  Genitourinary: Negative for frequency, hematuria, flank pain and difficulty urinating.  Musculoskeletal: Positive for myalgias. Negative for arthralgias and back pain.  Skin: Negative for rash.  Neurological: Negative for dizziness, speech difficulty, weakness, numbness and headaches.      Allergies  Penicillins  Home Medications  Prior to Admission medications   Medication Sig Start Date End Date Taking? Authorizing Provider  allopurinol (ZYLOPRIM) 300 MG tablet Take 300 mg by mouth daily.    Historical Provider, MD  apixaban (ELIQUIS) 2.5 MG TABS tablet Take 1 tablet (2.5 mg total) by mouth 2 (two) times daily. 03/07/13   [name], MD  aspirin 81 MG tablet Take 81 mg by mouth at bedtime. Last dose will be 02/05/12    Historical Provider, MD  atorvastatin (LIPITOR) 20 MG tablet Take 20 mg by mouth at bedtime.    Historical Provider, MD  carvedilol (COREG) 3.125 MG tablet Take 12.5 mg by mouth. 1/2 TAB  TWICE   DAILY    Historical Provider, MD  Cinnamon 500 MG TABS Take 2 tablets by mouth at bedtime. Last dose 02/05/12    Historical Provider, MD  Coenzyme Q10 (CO Q 10) 100 MG CAPS Take 1 capsule by mouth daily. Will sop 10/10    Historical Provider, MD  fish oil-omega-3 fatty acids 1000 MG capsule Take 1 g by mouth 2 (two) times daily. Will stop 02/05/12    Historical Provider, MD  lisinopril (PRINIVIL,ZESTRIL) 20 MG tablet Take 1 tablet (20 mg total) by mouth daily. 04/04/13   [name], MD  Multiple Vitamins-Minerals (PRESERVISION AREDS PO) Take 1 tablet by mouth 2 (two) times daily. Will stop 02/05/12    Historical Provider, MD  nitroGLYCERIN (NITROSTAT) 0.4 MG SL tablet Place 0.4 mg under the tongue every 5 (five) minutes as needed. For chest pain    Historical Provider, MD  OVER THE COUNTER MEDICATION Take 2 tablets by mouth 2 (two) times daily. GlucoCare    Historical Provider, MD  Probiotic Product (PROBIOTIC PO) Take 1 tablet by mouth daily.    Historical Provider, MD  Tamsulosin HCl (FLOMAX) 0.4 MG CAPS Take 0.4 mg by mouth daily after supper.    Historical Provider, MD  zinc gluconate 50 MG tablet Take 50 mg by mouth daily. Stop 02/05/12    Historical Provider, MD   BP 183/63  Pulse 65  Temp(Src) 97.7 F (36.5 C) (Oral)  Resp 18  SpO2 100% Physical Exam  Constitutional: He is oriented to person, place, and time. He appears well-developed and well-nourished.  HENT:  Head: Normocephalic and atraumatic.  Eyes: Pupils are equal, round, and reactive to light.  Neck: Normal range of motion. Neck supple.  Cardiovascular: Normal rate, regular rhythm and normal heart sounds.   Pulmonary/Chest: Effort normal and breath sounds normal. No respiratory distress. He has no wheezes. He has no rales. He exhibits no tenderness.  Abdominal: Soft. Bowel sounds are normal. There is no tenderness. There is no rebound and no guarding.  Musculoskeletal: Normal range of motion. He exhibits no edema.   Patient is tenderness across his anterior left thigh. There's no pain on palpation or range of motion of the hip. There is no pain over the sciatic nerve or the lower back. There's no pain on palpation of the knee. There is no noticeable swelling or redness of the leg. He is no pain in the inguinal or testicular area. He has palpable pedal pulses in the foot. He has no discoloration of the leg.  Lymphadenopathy:    He has no cervical adenopathy.  Neurological: He is alert and oriented to person, place, and time.  Skin: Skin is warm and dry. No rash noted.  Psychiatric: He has a normal mood and affect.    ED Course  Procedures (including critical  care time) Labs Review Labs Reviewed - No data to display  Imaging Review No results found.   EKG Interpretation None      MDM   Final diagnoses:  Myalgia    Pt without evidence of DVT, no claudication symptoms.  Likely muscular, will use tylenol at home for pain control and f/u with his PMD.    Malvin Johns, MD 10/17/13 [phone]

## 2013-10-17 NOTE — ED Notes (Signed)
Vascular Ultrasound at [facility]

## 2013-10-19 DIAGNOSIS — M79609 Pain in unspecified limb: Secondary | ICD-10-CM | POA: Diagnosis not present

## 2013-10-27 DIAGNOSIS — M76899 Other specified enthesopathies of unspecified lower limb, excluding foot: Secondary | ICD-10-CM | POA: Diagnosis not present

## 2013-10-27 DIAGNOSIS — M109 Gout, unspecified: Secondary | ICD-10-CM | POA: Diagnosis not present

## 2013-11-07 DIAGNOSIS — Z85828 Personal history of other malignant neoplasm of skin: Secondary | ICD-10-CM | POA: Diagnosis not present

## 2013-11-07 DIAGNOSIS — L821 Other seborrheic keratosis: Secondary | ICD-10-CM | POA: Diagnosis not present

## 2013-11-07 DIAGNOSIS — L570 Actinic keratosis: Secondary | ICD-10-CM | POA: Diagnosis not present

## 2013-12-07 ENCOUNTER — Ambulatory Visit (HOSPITAL_COMMUNITY): Payer: Medicare Other | Attending: Cardiovascular Disease | Admitting: Cardiology

## 2013-12-07 ENCOUNTER — Other Ambulatory Visit: Payer: Medicare Other

## 2013-12-07 DIAGNOSIS — I6529 Occlusion and stenosis of unspecified carotid artery: Secondary | ICD-10-CM | POA: Insufficient documentation

## 2013-12-07 NOTE — Progress Notes (Signed)
Carotid duplex performed 

## 2013-12-08 ENCOUNTER — Ambulatory Visit (INDEPENDENT_AMBULATORY_CARE_PROVIDER_SITE_OTHER): Payer: Medicare Other | Admitting: Cardiovascular Disease

## 2013-12-08 ENCOUNTER — Encounter: Payer: Self-pay | Admitting: Cardiovascular Disease

## 2013-12-08 VITALS — BP 140/70 | HR 87 | Ht 68.0 in | Wt 158.0 lb

## 2013-12-08 DIAGNOSIS — I10 Essential (primary) hypertension: Secondary | ICD-10-CM

## 2013-12-08 DIAGNOSIS — I6529 Occlusion and stenosis of unspecified carotid artery: Secondary | ICD-10-CM | POA: Diagnosis not present

## 2013-12-08 DIAGNOSIS — I4891 Unspecified atrial fibrillation: Secondary | ICD-10-CM

## 2013-12-08 NOTE — Assessment & Plan Note (Signed)
Well controlled.  Continue current medications and low sodium Dash type diet.   Gets symptomatic low BP after taking lisinopril after golfing or going to [employer] Will hold on these days

## 2013-12-08 NOTE — Progress Notes (Signed)
Patient ID: [name], male   DOB: [birth year], 80 y.o.   MRN: [id number] [name] is seen for F/U of CAD. 3/11 had SSCP and required cath. All of his grafts were patent and his pain has not recurred.  CABG 1999: SVG: IM,OM1,OM2, SVG D1, SVG PDA/PLA and [place] to LAD EF was 45-50% with mild diffuse hypokinesis  . He sees Dr Burt Knack for PVD. ABI 3/11 .88 on right. Carotid duplex with no significant stenosis S/P right CEA. Has mild claudication in left calf that he walks.  10/14 wo months ago had episode golfing with visual distrubance and dizzyness. Noted irregularity of pulse and BP lower readings. Saw [facility] [place] who could not document arrhythia but thought BP low and cut lisinopril form 20 to 5 mg and lowered coreg. Had another episode of weakness and funny vision last week and home BP readings continued to show irregular pulse. In our office ECG shows afib/flutter which is new diagnosis  Subsequent monitor shows persistatn flutter/fib Started on Eliquis Has history of easy bruising and some of his [place] labs are abnormal. Have asked him to see Hematology since he needs anticoagulation. CT head and renal function normal.  DCC done 01/27/13 and maint NSR   Carotids done 12/07/13 1-39% bilateral disease    ROS: Denies fever, malais, weight loss, blurry vision, decreased visual acuity, cough, sputum, SOB, hemoptysis, pleuritic pain, palpitaitons, heartburn, abdominal pain, melena, lower extremity edema, claudication, or rash.  All other systems reviewed and negative  General: Affect appropriate Healthy:  appears stated age 80: normal Neck supple with no adenopathy JVP normal right  Bruits with CEA  no thyromegaly Lungs clear with no wheezing and good diaphragmatic motion Heart:  S1/S2 no murmur, no rub, gallop or click PMI normal Abdomen: benighn, BS positve, no tenderness, no AAA no bruit.  No HSM or HJR Distal pulses intact with no bruits No edema Neuro non-focal Skin  warm and dry No muscular weakness   Current Outpatient Prescriptions  Medication Sig Dispense Refill  . allopurinol (ZYLOPRIM) 300 MG tablet Take 300 mg by mouth daily.      Marland Kitchen apixaban (ELIQUIS) 2.5 MG TABS tablet Take 1 tablet (2.5 mg total) by mouth 2 (two) times daily.  180 tablet  3  . aspirin 81 MG tablet Take 81 mg by mouth at bedtime. Last dose will be 02/05/12      . atorvastatin (LIPITOR) 20 MG tablet Take 20 mg by mouth at bedtime.      . carvedilol (COREG) 6.25 MG tablet Take 6.25 mg by mouth 2 (two) times daily with a meal.      . Cinnamon 500 MG TABS Take 2 tablets by mouth at bedtime. Last dose 02/05/12      . Coenzyme Q10 (CO Q 10) 100 MG CAPS Take 1 capsule by mouth daily. Will sop 10/10      . fish oil-omega-3 fatty acids 1000 MG capsule Take 1 g by mouth 2 (two) times daily. Will stop 02/05/12      . lisinopril (PRINIVIL,ZESTRIL) 20 MG tablet Take 1 tablet (20 mg total) by mouth daily.  90 tablet  3  . Multiple Vitamins-Minerals (PRESERVISION AREDS PO) Take 1 tablet by mouth 2 (two) times daily. Will stop 02/05/12      . nitroGLYCERIN (NITROSTAT) 0.4 MG SL tablet Place 0.4 mg under the tongue every 5 (five) minutes as needed. For chest pain      . OVER THE COUNTER MEDICATION Take 2  tablets by mouth 2 (two) times daily. GlucoCare      . Probiotic Product (PROBIOTIC PO) Take 1 tablet by mouth daily.      . Tamsulosin HCl (FLOMAX) 0.4 MG CAPS Take 0.4 mg by mouth daily after supper.      . zinc gluconate 50 MG tablet Take 50 mg by mouth daily. Stop 02/05/12       No current facility-administered medications for this visit.    Allergies  Penicillins  Electrocardiogram:  SR rate 87 PR  248   Assessment and Plan

## 2013-12-08 NOTE — Assessment & Plan Note (Signed)
Bruit but no restenosis post CEA  ASA  Duplex in a year

## 2013-12-08 NOTE — Assessment & Plan Note (Signed)
Stable with no angina and good activity level.  Continue medical Rx  

## 2013-12-08 NOTE — Patient Instructions (Addendum)
Your physician recommends that you continue on your current medications as directed. Please refer to the Current Medication list given to you today.  Your physician wants you to follow-up in: 1 year. You will receive a reminder letter in the mail two months in advance. If you don't receive a letter, please call our office to schedule the follow-up appointment.  

## 2013-12-08 NOTE — Assessment & Plan Note (Signed)
No palpitations maint NSR  Continue current meds

## 2013-12-21 DIAGNOSIS — H35369 Drusen (degenerative) of macula, unspecified eye: Secondary | ICD-10-CM | POA: Diagnosis not present

## 2013-12-21 DIAGNOSIS — H43819 Vitreous degeneration, unspecified eye: Secondary | ICD-10-CM | POA: Diagnosis not present

## 2013-12-21 DIAGNOSIS — H353190 Nonexudative age-related macular degeneration, unspecified eye, stage unspecified: Secondary | ICD-10-CM | POA: Diagnosis not present

## 2014-01-16 DIAGNOSIS — E7800 Pure hypercholesterolemia, unspecified: Secondary | ICD-10-CM | POA: Diagnosis not present

## 2014-01-16 DIAGNOSIS — N183 Chronic kidney disease, stage 3 unspecified: Secondary | ICD-10-CM | POA: Diagnosis not present

## 2014-01-16 DIAGNOSIS — I10 Essential (primary) hypertension: Secondary | ICD-10-CM | POA: Diagnosis not present

## 2014-01-16 DIAGNOSIS — E1129 Type 2 diabetes mellitus with other diabetic kidney complication: Secondary | ICD-10-CM | POA: Diagnosis not present

## 2014-01-16 DIAGNOSIS — M109 Gout, unspecified: Secondary | ICD-10-CM | POA: Diagnosis not present

## 2014-01-20 DIAGNOSIS — Z23 Encounter for immunization: Secondary | ICD-10-CM | POA: Diagnosis not present

## 2014-02-13 DIAGNOSIS — E11329 Type 2 diabetes mellitus with mild nonproliferative diabetic retinopathy without macular edema: Secondary | ICD-10-CM | POA: Diagnosis not present

## 2014-02-13 DIAGNOSIS — H25012 Cortical age-related cataract, left eye: Secondary | ICD-10-CM | POA: Diagnosis not present

## 2014-02-13 DIAGNOSIS — H3531 Nonexudative age-related macular degeneration: Secondary | ICD-10-CM | POA: Diagnosis not present

## 2014-02-13 DIAGNOSIS — H2513 Age-related nuclear cataract, bilateral: Secondary | ICD-10-CM | POA: Diagnosis not present

## 2014-02-20 ENCOUNTER — Other Ambulatory Visit: Payer: Self-pay | Admitting: Cardiovascular Disease

## 2014-03-01 DIAGNOSIS — H25812 Combined forms of age-related cataract, left eye: Secondary | ICD-10-CM | POA: Diagnosis not present

## 2014-03-01 DIAGNOSIS — H2181 Floppy iris syndrome: Secondary | ICD-10-CM | POA: Diagnosis not present

## 2014-03-01 DIAGNOSIS — H25012 Cortical age-related cataract, left eye: Secondary | ICD-10-CM | POA: Diagnosis not present

## 2014-03-01 DIAGNOSIS — H2512 Age-related nuclear cataract, left eye: Secondary | ICD-10-CM | POA: Diagnosis not present

## 2014-04-05 DIAGNOSIS — H25811 Combined forms of age-related cataract, right eye: Secondary | ICD-10-CM | POA: Diagnosis not present

## 2014-04-05 DIAGNOSIS — H25011 Cortical age-related cataract, right eye: Secondary | ICD-10-CM | POA: Diagnosis not present

## 2014-04-05 DIAGNOSIS — H52201 Unspecified astigmatism, right eye: Secondary | ICD-10-CM | POA: Diagnosis not present

## 2014-04-05 DIAGNOSIS — H2511 Age-related nuclear cataract, right eye: Secondary | ICD-10-CM | POA: Diagnosis not present

## 2014-05-11 DIAGNOSIS — D692 Other nonthrombocytopenic purpura: Secondary | ICD-10-CM | POA: Diagnosis not present

## 2014-05-11 DIAGNOSIS — Z85820 Personal history of malignant melanoma of skin: Secondary | ICD-10-CM | POA: Diagnosis not present

## 2014-05-11 DIAGNOSIS — Z85828 Personal history of other malignant neoplasm of skin: Secondary | ICD-10-CM | POA: Diagnosis not present

## 2014-05-11 DIAGNOSIS — L821 Other seborrheic keratosis: Secondary | ICD-10-CM | POA: Diagnosis not present

## 2014-05-11 DIAGNOSIS — L570 Actinic keratosis: Secondary | ICD-10-CM | POA: Diagnosis not present

## 2014-07-10 DIAGNOSIS — M109 Gout, unspecified: Secondary | ICD-10-CM | POA: Diagnosis not present

## 2014-07-10 DIAGNOSIS — N183 Chronic kidney disease, stage 3 (moderate): Secondary | ICD-10-CM | POA: Diagnosis not present

## 2014-07-10 DIAGNOSIS — I129 Hypertensive chronic kidney disease with stage 1 through stage 4 chronic kidney disease, or unspecified chronic kidney disease: Secondary | ICD-10-CM | POA: Diagnosis not present

## 2014-07-10 DIAGNOSIS — I739 Peripheral vascular disease, unspecified: Secondary | ICD-10-CM | POA: Diagnosis not present

## 2014-07-10 DIAGNOSIS — E780 Pure hypercholesterolemia: Secondary | ICD-10-CM | POA: Diagnosis not present

## 2014-07-10 DIAGNOSIS — E1122 Type 2 diabetes mellitus with diabetic chronic kidney disease: Secondary | ICD-10-CM | POA: Diagnosis not present

## 2014-08-04 ENCOUNTER — Ambulatory Visit (HOSPITAL_COMMUNITY)
Admission: RE | Admit: 2014-08-04 | Discharge: 2014-08-04 | Disposition: A | Discharge status: Home / Self Care | Payer: Medicare Other | Source: Ambulatory Visit | Attending: Vascular Surgery | Admitting: Vascular Surgery

## 2014-08-04 ENCOUNTER — Other Ambulatory Visit (HOSPITAL_COMMUNITY): Payer: Self-pay | Admitting: Family Medicine

## 2014-08-04 DIAGNOSIS — I739 Peripheral vascular disease, unspecified: Secondary | ICD-10-CM

## 2014-10-11 ENCOUNTER — Telehealth: Payer: Self-pay | Admitting: Cardiovascular Disease

## 2014-10-11 NOTE — Telephone Encounter (Signed)
Flex PA or nurse visit this week to check ECG

## 2014-10-11 NOTE — Telephone Encounter (Signed)
SPOKE WITH PT   RE MESSAGE  HAS  NOTED  OVER  THE  LAST 2-3  DAYS  WHEN  CHANGES  POSITIONS  FEELS  LIGHTHEADED   ALSO NOTES  FATIGUE   B/P  TODAY   WAS   140/46 AND  HR 62  IRREG PT  STATES  THESE  SYMPTOMS  ARE  SIMILAR TO WHEN  HE  IS IN  AFIB  WILL  FORWARD TO  DR Johnsie Cancel  FOR  REVIEW  PT  AWARE IF  S/S WORSEN TO GO  TO ER  FOR EVAL AND  TX .Adonis Housekeeper

## 2014-10-11 NOTE — Telephone Encounter (Signed)
New Prob    Pt feels he may be in A-Fib again. Please call.

## 2014-10-16 NOTE — Telephone Encounter (Signed)
LM TO CALL BACK ./CY 

## 2014-10-19 ENCOUNTER — Ambulatory Visit
Admission: RE | Admit: 2014-10-19 | Discharge: 2014-10-19 | Disposition: A | Discharge status: Home / Self Care | Payer: Medicare Other | Source: Ambulatory Visit | Attending: Nurse Practitioner | Admitting: Nurse Practitioner

## 2014-10-19 ENCOUNTER — Other Ambulatory Visit: Payer: Self-pay | Admitting: Nurse Practitioner

## 2014-10-19 ENCOUNTER — Encounter: Payer: Self-pay | Admitting: Nurse Practitioner

## 2014-10-19 ENCOUNTER — Ambulatory Visit (INDEPENDENT_AMBULATORY_CARE_PROVIDER_SITE_OTHER): Payer: Medicare Other | Admitting: Nurse Practitioner

## 2014-10-19 VITALS — BP 160/70 | HR 54 | Ht 68.0 in | Wt 165.0 lb

## 2014-10-19 DIAGNOSIS — I259 Chronic ischemic heart disease, unspecified: Secondary | ICD-10-CM

## 2014-10-19 DIAGNOSIS — I739 Peripheral vascular disease, unspecified: Secondary | ICD-10-CM | POA: Diagnosis not present

## 2014-10-19 DIAGNOSIS — I4891 Unspecified atrial fibrillation: Secondary | ICD-10-CM

## 2014-10-19 DIAGNOSIS — E7800 Pure hypercholesterolemia, unspecified: Secondary | ICD-10-CM

## 2014-10-19 DIAGNOSIS — R42 Dizziness and giddiness: Secondary | ICD-10-CM | POA: Diagnosis not present

## 2014-10-19 DIAGNOSIS — Z01810 Encounter for preprocedural cardiovascular examination: Secondary | ICD-10-CM | POA: Diagnosis not present

## 2014-10-19 DIAGNOSIS — R001 Bradycardia, unspecified: Secondary | ICD-10-CM

## 2014-10-19 DIAGNOSIS — Z951 Presence of aortocoronary bypass graft: Secondary | ICD-10-CM | POA: Diagnosis not present

## 2014-10-19 LAB — CBC
HCT: 36.3 % — ABNORMAL LOW (ref 39.0–52.0)
Hemoglobin: 12 g/dL — ABNORMAL LOW (ref 13.0–17.0)
MCHC: 33.2 g/dL (ref 30.0–36.0)
MCV: 92 fl (ref 78.0–100.0)
Platelets: 193 10*3/uL (ref 150.0–400.0)
RBC: 3.94 Mil/uL — ABNORMAL LOW (ref 4.22–5.81)
RDW: 15.6 % — ABNORMAL HIGH (ref 11.5–15.5)
WBC: 7 10*3/uL (ref 4.0–10.5)

## 2014-10-19 LAB — BASIC METABOLIC PANEL
BUN: 30 mg/dL — ABNORMAL HIGH (ref 6–23)
CO2: 25 mEq/L (ref 19–32)
Calcium: 9 mg/dL (ref 8.4–10.5)
Chloride: 108 mEq/L (ref 96–112)
Creatinine, Ser: 1.82 mg/dL — ABNORMAL HIGH (ref 0.40–1.50)
GFR: 37.89 mL/min — ABNORMAL LOW (ref >60.00)
Glucose, Bld: 100 mg/dL — ABNORMAL HIGH (ref 70–99)
Potassium: 5 mEq/L (ref 3.5–5.1)
Sodium: 137 mEq/L (ref 135–145)

## 2014-10-19 LAB — TSH: TSH: 2.3 u[IU]/mL (ref 0.35–4.50)

## 2014-10-19 NOTE — Patient Instructions (Addendum)
We will be checking the following labs today - BMET, CBC, TSH  Come for lab on Monday, July 18th - BMET, CBC, PT, PTT  Please go to [facility] to [place] on the first floor for a chest Xray - you may walk in.    Medication Instructions:    Continue with your current medicines.     Testing/Procedures To Be Arranged:  Echocardiogram  Follow-Up:   See Dr. Johnsie Cancel as planned in August    Other Special Instructions:  Your provider has recommended a cardioversion.   You are scheduled for a cardioversion on Wednesday, July 22nd at [facility] with Dr. Johnsie Cancel or associates. Please go to [facility] 2nd [place] Stay at Wednesday, July 22nd at 1:00 PM Enter through the [place] not have any food or drink after midnight on Tuesday.  You may take your medicines with a sip of water on the day of your procedure.  You will need someone to drive you home following your procedure.   Call the [place] office at [phone] if you have any questions, problems or concerns.   Electrical Cardioversion Electrical cardioversion is the delivery of a jolt of electricity to change the rhythm of the heart. Sticky patches or metal paddles are placed on the chest to deliver the electricity from a device. This is done to restore a normal rhythm. A rhythm that is too fast or not regular keeps the heart from pumping well. Electrical cardioversion is done in an emergency if:  There is low or no blood pressure as a result of the heart rhythm.  Normal rhythm must be restored as fast as possible to protect the brain and heart from further damage.  It may save a life. Cardioversion may be done for heart rhythms that are not immediately life threatening, such as atrial fibrillation or flutter, in which:  The heart is beating too fast or is not regular.  Medicine to change the rhythm has not worked.  It is safe to wait in order to allow time for  preparation. Symptoms of the abnormal rhythm are bothersome. The risk of stroke and other serious problems can be reduced.  LET [facility] CARE PROVIDER KNOW ABOUT:  Any allergies you have. All medicines you are taking, including vitamins, herbs, eye drops, creams, and over-the-counter medicines. Previous problems you or members of your family have had with the use of anesthetics.  Any blood disorders you have.  Previous surgeries you have had.  Medical conditions you have.   RISKS AND COMPLICATIONS  Generally, this is a safe procedure. However, problems can occur and include:  Breathing problems related to the anesthetic used. A blood clot that breaks free and travels to other parts of your body. This could cause a stroke or other problems. The risk of this is lowered by use of blood-thinning medicine (anticoagulant) prior to the procedure. Cardiac arrest (rare).  BEFORE THE PROCEDURE  You may have tests to detect blood clots in your heart and to evaluate heart function. You may start taking anticoagulants so your blood does not clot as easily.  Medicines may be given to help stabilize your heart rate and rhythm.  PROCEDURE You will be given medicine through an IV tube to reduce discomfort and make you sleepy (sedative).  An electrical shock will be delivered.  AFTER THE PROCEDURE Your heart rhythm will be watched to make sure it does not change. You will need someone to  drive you home.   Call the [place] office at [phone] if you have any questions, problems or concerns.

## 2014-10-19 NOTE — Progress Notes (Signed)
CARDIOLOGY OFFICE NOTE  Date:  10/19/2014    [name] Date of Birth: 25-Nov-1928 Medical Record [id number]  PCP:  Abigail Miyamoto, MD  Cardiologist:  Johnsie Cancel    Chief Complaint  Patient presents with  . Dizziness    Work in visit - seen for Dr. Johnsie Cancel    History of Present Illness: [name] is a 81 y.o. male who presents today for a work in visit. Seen for Dr. Johnsie Cancel. He has a history of  CAD with prior CABG in 1999 - had SSCP in 2011 and required cath. All of his grafts were patent and his pain has not recurred.  CABG 1999: SVG: IM,OM1,OM2, SVG D1, SVG PDA/PLA and [place] to LAD EF was 45-50% with mild diffuse hypokinesis. His other issues include PVD, claudication, atrial fib/flutter on monitor back in 2014. Started on Eliquis. Apparently has some of his  [place] labs abnormal and was asked to see hematology. Carotids done 12/07/13 1-39% bilateral disease.   Apparently cardioverted back in October of 2014. Last EKG was NSR.   Last echo from 2014 with EF of 55 to 60%.   He was last seen in August of 2015.   Phone call last week -  SPOKE WITH PT RE MESSAGE HAS NOTED OVER THE LAST 2-3 DAYS WHEN CHANGES POSITIONS FEELS LIGHTHEADED ALSO NOTES FATIGUE B/P TODAY WAS 140/46 AND HR 62 IRREG PT STATES THESE SYMPTOMS ARE SIMILAR TO WHEN HE IS IN AFIB WILL FORWARD TO DR Johnsie Cancel FOR REVIEW PT AWARE IF S/S WORSEN TO GO TO ER FOR EVAL AND TX ./CY       Thus added to the FLEX.  Comes in today. Here alone. Notes less energy. HR slow. Lowest HR is 58. Says it is irregular. Says this has been going on for about a month. If gets up too quick - gets presyncopal. Remains on his Eliquis. He is on renal dose due to age > 59 and creatinine >1.5. No missed doses. No chest pain. Not short of breath. Tries to stay pretty active.   Past Medical History  Diagnosis Date  . Coronary artery disease     post bypass  . Hypertension   . PVD  (peripheral vascular disease)   . CVD (cardiovascular disease)   . Hyperlipidemia   . Diverticular disease   . Bigeminal rhythm   . Hernia   . Easy bruising   . Diabetes mellitus without complication     Past Surgical History  Procedure Laterality Date  . Coronary artery bypass graft  1999  . Carotid endarterectomy  2009/ 1993    left/ right  . Cardiac catheterization  2006  . Eye surgery      eyelid repair  . Skin cancer excision      right ear x 3  . Inguinal hernia repair  02/11/2012    Procedure: LAPAROSCOPIC BILATERAL INGUINAL HERNIA REPAIR;  Surgeon: Pedro Earls, MD;  Location: [facility] ORS;  Service: General;  Laterality: Bilateral;  . Hernia repair  02/11/12    Bilateral inguinal hernia repair  . Cardioversion N/A 02/22/2013    Procedure: CARDIOVERSION;  Surgeon: [name], MD;  Location: [facility] ENDOSCOPY;  Service: Cardiovascular;  Laterality: N/A;     Medications: Current Outpatient Prescriptions  Medication Sig Dispense Refill  . allopurinol (ZYLOPRIM) 300 MG tablet Take 300 mg by mouth daily.    Marland Kitchen aspirin 81 MG tablet Take 81 mg by mouth at bedtime. Last dose  will be 02/05/12    . atorvastatin (LIPITOR) 20 MG tablet Take 20 mg by mouth at bedtime.    . carvedilol (COREG) 6.25 MG tablet Take 6.25 mg by mouth 2 (two) times daily with a meal.    . Cinnamon 500 MG TABS Take 2 tablets by mouth at bedtime. Last dose 02/05/12    . Coenzyme Q10 (CO Q 10) 100 MG CAPS Take 1 capsule by mouth daily. Will sop 10/10    . ELIQUIS 2.5 MG TABS tablet TAKE 1 TABLET TWICE A DAY 180 tablet 3  . fish oil-omega-3 fatty acids 1000 MG capsule Take 1 g by mouth 2 (two) times daily. Will stop 02/05/12    . lisinopril (PRINIVIL,ZESTRIL) 20 MG tablet Take 1 tablet (20 mg total) by mouth daily. 90 tablet 3  . Multiple Vitamins-Minerals (PRESERVISION AREDS PO) Take 1 tablet by mouth 2 (two) times daily. Will stop 02/05/12    . nitroGLYCERIN (NITROSTAT) 0.4 MG SL tablet Place 0.4 mg under the  tongue every 5 (five) minutes as needed. For chest pain    . OVER THE COUNTER MEDICATION Take 2 tablets by mouth 2 (two) times daily. GlucoCare    . Probiotic Product (PROBIOTIC PO) Take 1 tablet by mouth daily.    . Tamsulosin HCl (FLOMAX) 0.4 MG CAPS Take 0.4 mg by mouth daily after supper.    . zinc gluconate 50 MG tablet Take 50 mg by mouth daily. Stop 02/05/12     No current facility-administered medications for this visit.    Allergies: Allergies  Allergen Reactions  . Penicillins Hives    Social History: The patient  reports that he quit smoking about 52 years ago. He quit smokeless tobacco use about 45 years ago. His smokeless tobacco use included Chew. He reports that he does not drink alcohol or use illicit drugs.   Family History: The patient's family history includes Coronary artery disease in his father; Stroke in his mother.   Review of Systems: Please see the history of present illness.   Otherwise, the review of systems is positive for dizziness, easy bruising, skipped heart beats and palpitations.   All other systems are reviewed and negative.   Physical Exam: VS:  BP 160/70 mmHg  Pulse 54  Ht 5\' 8"  (1.727 m)  Wt 165 lb (74.844 kg)  BMI 25.09 kg/m2 .  BMI Body mass index is 25.09 kg/(m^2).  Wt Readings from Last 3 Encounters:  10/19/14 165 lb (74.844 kg)  12/08/13 158 lb (71.668 kg)  06/03/13 162 lb 1.9 oz (73.537 kg)    General: Pleasant. Well developed, well nourished and in no acute distress.  HEENT: Normal. Neck: Supple, no JVD, carotid bruits, or masses noted.  Cardiac: Irregular irregular rhythm. Rate is bradycardic. No murmurs, rubs, or gallops. No edema.  Respiratory:  Lungs are clear to auscultation bilaterally with normal work of breathing.  GI: Soft and nontender.  MS: No deformity or atrophy. Gait and ROM intact. Skin: Warm and dry. Color is normal.  Neuro:  Strength and sensation are intact and no gross focal deficits noted.  Psych: Alert,  appropriate and with normal affect.   LABORATORY DATA:  EKG:  EKG is ordered today. This demonstrates atrial fibrillation with slow VR - HR is 54.  Lab Results  Component Value Date   WBC 6.2 02/15/2013   HGB 11.1* 02/15/2013   HCT 33.2* 02/15/2013   PLT 221.0 02/15/2013   GLUCOSE 175* 02/15/2013   CHOL 112 06/09/2013  TRIG 69.0 06/09/2013   HDL 43.20 06/09/2013   LDLCALC 55 06/09/2013   ALT 22 06/09/2013   AST 25 06/09/2013   NA 139 02/15/2013   K 4.9 02/15/2013   CL 106 02/15/2013   CREATININE 1.6* 02/15/2013   BUN 32* 02/15/2013   CO2 24 02/15/2013   INR 1.1* 01/03/2013   HGBA1C 6.3* 06/15/2011    BNP (last 3 results) No results for input(s): BNP in the last 8760 hours.  ProBNP (last 3 results) No results for input(s): PROBNP in the last 8760 hours.   Other Studies Reviewed Today:   Assessment/Plan: 1. Recurrent atrial fib - rate is ok - I have left him on his current regimen. He would like to proceed with repeat cardioversion - check lab today. Update his echo. Procedure has been reviewed and he is willing to proceed.   2. HTN - BP fair - better control.   3. PVD  4. CAD - stable. No symptoms.   Current medicines are reviewed with the patient today.  The patient does not have concerns regarding medicines other than what has been noted above.  The following changes have been made:  See above.  Labs/ tests ordered today include:   No orders of the defined types were placed in this encounter.     Disposition:   FU with Dr. Johnsie Cancel as planned in August.      Patient is agreeable to this plan and will call if any problems develop in the interim.   Signed: Burtis Junes, RN, ANP-C 10/19/2014 8:44 AM  [facility] [address] [place],   [address] Phone: [phone] Fax: [phone]

## 2014-10-23 ENCOUNTER — Ambulatory Visit (HOSPITAL_COMMUNITY): Payer: Medicare Other | Attending: Cardiovascular Disease

## 2014-10-23 ENCOUNTER — Other Ambulatory Visit: Payer: Self-pay

## 2014-10-23 DIAGNOSIS — I739 Peripheral vascular disease, unspecified: Secondary | ICD-10-CM | POA: Diagnosis not present

## 2014-10-23 DIAGNOSIS — I259 Chronic ischemic heart disease, unspecified: Secondary | ICD-10-CM

## 2014-10-23 DIAGNOSIS — E7800 Pure hypercholesterolemia, unspecified: Secondary | ICD-10-CM

## 2014-10-23 DIAGNOSIS — I340 Nonrheumatic mitral (valve) insufficiency: Secondary | ICD-10-CM | POA: Insufficient documentation

## 2014-10-23 DIAGNOSIS — I4891 Unspecified atrial fibrillation: Secondary | ICD-10-CM

## 2014-10-23 DIAGNOSIS — R001 Bradycardia, unspecified: Secondary | ICD-10-CM

## 2014-10-23 DIAGNOSIS — I517 Cardiomegaly: Secondary | ICD-10-CM | POA: Insufficient documentation

## 2014-10-23 DIAGNOSIS — R42 Dizziness and giddiness: Secondary | ICD-10-CM

## 2014-10-23 NOTE — Telephone Encounter (Signed)
SPOKE WITH PT   THIS MESSAGE IS OLD THIS  HAS BEEN  ADDRESSED    PT WAS  SEEN BY LORI ON  10-19-14 .Adonis Housekeeper

## 2014-11-08 DIAGNOSIS — H40013 Open angle with borderline findings, low risk, bilateral: Secondary | ICD-10-CM | POA: Diagnosis not present

## 2014-11-09 DIAGNOSIS — Z85828 Personal history of other malignant neoplasm of skin: Secondary | ICD-10-CM | POA: Diagnosis not present

## 2014-11-09 DIAGNOSIS — D1801 Hemangioma of skin and subcutaneous tissue: Secondary | ICD-10-CM | POA: Diagnosis not present

## 2014-11-09 DIAGNOSIS — L812 Freckles: Secondary | ICD-10-CM | POA: Diagnosis not present

## 2014-11-09 DIAGNOSIS — D692 Other nonthrombocytopenic purpura: Secondary | ICD-10-CM | POA: Diagnosis not present

## 2014-11-09 DIAGNOSIS — L821 Other seborrheic keratosis: Secondary | ICD-10-CM | POA: Diagnosis not present

## 2014-11-09 DIAGNOSIS — L570 Actinic keratosis: Secondary | ICD-10-CM | POA: Diagnosis not present

## 2014-11-13 ENCOUNTER — Other Ambulatory Visit (INDEPENDENT_AMBULATORY_CARE_PROVIDER_SITE_OTHER): Payer: Medicare Other | Admitting: *Deleted

## 2014-11-13 DIAGNOSIS — E7800 Pure hypercholesterolemia, unspecified: Secondary | ICD-10-CM

## 2014-11-13 DIAGNOSIS — R42 Dizziness and giddiness: Secondary | ICD-10-CM | POA: Diagnosis not present

## 2014-11-13 DIAGNOSIS — I259 Chronic ischemic heart disease, unspecified: Secondary | ICD-10-CM | POA: Diagnosis not present

## 2014-11-13 DIAGNOSIS — I739 Peripheral vascular disease, unspecified: Secondary | ICD-10-CM

## 2014-11-13 DIAGNOSIS — I4891 Unspecified atrial fibrillation: Secondary | ICD-10-CM

## 2014-11-13 DIAGNOSIS — R001 Bradycardia, unspecified: Secondary | ICD-10-CM

## 2014-11-13 LAB — CBC
HCT: 35.7 % — ABNORMAL LOW (ref 39.0–52.0)
Hemoglobin: 11.9 g/dL — ABNORMAL LOW (ref 13.0–17.0)
MCHC: 33.3 g/dL (ref 30.0–36.0)
MCV: 91.5 fl (ref 78.0–100.0)
Platelets: 174 10*3/uL (ref 150.0–400.0)
RBC: 3.9 Mil/uL — ABNORMAL LOW (ref 4.22–5.81)
RDW: 16 % — ABNORMAL HIGH (ref 11.5–15.5)
WBC: 6.2 10*3/uL (ref 4.0–10.5)

## 2014-11-13 LAB — APTT: aPTT: 34 s — ABNORMAL HIGH (ref 23.4–32.7)

## 2014-11-13 LAB — BASIC METABOLIC PANEL
BUN: 35 mg/dL — ABNORMAL HIGH (ref 6–23)
CO2: 23 mEq/L (ref 19–32)
Calcium: 8.8 mg/dL (ref 8.4–10.5)
Chloride: 109 mEq/L (ref 96–112)
Creatinine, Ser: 1.8 mg/dL — ABNORMAL HIGH (ref 0.40–1.50)
GFR: 38.37 mL/min — ABNORMAL LOW (ref >60.00)
Glucose, Bld: 99 mg/dL (ref 70–99)
Potassium: 5 mEq/L (ref 3.5–5.1)
Sodium: 139 mEq/L (ref 135–145)

## 2014-11-13 LAB — PROTIME-INR
INR: 1.2 ratio — ABNORMAL HIGH (ref 0.8–1.0)
Prothrombin Time: 13.2 s — ABNORMAL HIGH (ref 9.6–13.1)

## 2014-11-13 NOTE — Addendum Note (Signed)
Addended by: Eulis Foster on: 11/13/2014 09:25 AM   Modules accepted: Orders

## 2014-11-14 ENCOUNTER — Telehealth: Payer: Self-pay | Admitting: Cardiovascular Disease

## 2014-11-14 MED ORDER — SODIUM CHLORIDE 0.9 % IV SOLN
INTRAVENOUS | Status: DC
  Administered 2014-11-15: 13:00:00 via INTRAVENOUS

## 2014-11-14 NOTE — H&P (View-Only) (Signed)
CARDIOLOGY OFFICE NOTE  Date:  10/19/2014    [name] Date of Birth: 09-19-1928 Medical Record [id number]  PCP:  Abigail Miyamoto, MD  Cardiologist:  Johnsie Cancel    Chief Complaint  Patient presents with  . Dizziness    Work in visit - seen for Dr. Johnsie Cancel    History of Present Illness: [name] is a 81 y.o. male who presents today for a work in visit. Seen for Dr. Johnsie Cancel. He has a history of  CAD with prior CABG in 1999 - had SSCP in 2011 and required cath. All of his grafts were patent and his pain has not recurred.  CABG 1999: SVG: IM,OM1,OM2, SVG D1, SVG PDA/PLA and [place] to LAD EF was 45-50% with mild diffuse hypokinesis. His other issues include PVD, claudication, atrial fib/flutter on monitor back in 2014. Started on Eliquis. Apparently has some of his  [place] labs abnormal and was asked to see hematology. Carotids done 12/07/13 1-39% bilateral disease.   Apparently cardioverted back in October of 2014. Last EKG was NSR.   Last echo from 2014 with EF of 55 to 60%.   He was last seen in August of 2015.   Phone call last week -  SPOKE WITH PT RE MESSAGE HAS NOTED OVER THE LAST 2-3 DAYS WHEN CHANGES POSITIONS FEELS LIGHTHEADED ALSO NOTES FATIGUE B/P TODAY WAS 140/46 AND HR 62 IRREG PT STATES THESE SYMPTOMS ARE SIMILAR TO WHEN HE IS IN AFIB WILL FORWARD TO DR Johnsie Cancel FOR REVIEW PT AWARE IF S/S WORSEN TO GO TO ER FOR EVAL AND TX ./CY       Thus added to the FLEX.  Comes in today. Here alone. Notes less energy. HR slow. Lowest HR is 58. Says it is irregular. Says this has been going on for about a month. If gets up too quick - gets presyncopal. Remains on his Eliquis. He is on renal dose due to age > 78 and creatinine >1.5. No missed doses. No chest pain. Not short of breath. Tries to stay pretty active.   Past Medical History  Diagnosis Date  . Coronary artery disease     post bypass  . Hypertension   . PVD  (peripheral vascular disease)   . CVD (cardiovascular disease)   . Hyperlipidemia   . Diverticular disease   . Bigeminal rhythm   . Hernia   . Easy bruising   . Diabetes mellitus without complication     Past Surgical History  Procedure Laterality Date  . Coronary artery bypass graft  1999  . Carotid endarterectomy  2009/ 1993    left/ right  . Cardiac catheterization  2006  . Eye surgery      eyelid repair  . Skin cancer excision      right ear x 3  . Inguinal hernia repair  02/11/2012    Procedure: LAPAROSCOPIC BILATERAL INGUINAL HERNIA REPAIR;  Surgeon: Pedro Earls, MD;  Location: [facility] ORS;  Service: General;  Laterality: Bilateral;  . Hernia repair  02/11/12    Bilateral inguinal hernia repair  . Cardioversion N/A 02/22/2013    Procedure: CARDIOVERSION;  Surgeon: [name], MD;  Location: [facility] ENDOSCOPY;  Service: Cardiovascular;  Laterality: N/A;     Medications: Current Outpatient Prescriptions  Medication Sig Dispense Refill  . allopurinol (ZYLOPRIM) 300 MG tablet Take 300 mg by mouth daily.    Marland Kitchen aspirin 81 MG tablet Take 81 mg by mouth at bedtime. Last dose  will be 02/05/12    . atorvastatin (LIPITOR) 20 MG tablet Take 20 mg by mouth at bedtime.    . carvedilol (COREG) 6.25 MG tablet Take 6.25 mg by mouth 2 (two) times daily with a meal.    . Cinnamon 500 MG TABS Take 2 tablets by mouth at bedtime. Last dose 02/05/12    . Coenzyme Q10 (CO Q 10) 100 MG CAPS Take 1 capsule by mouth daily. Will sop 10/10    . ELIQUIS 2.5 MG TABS tablet TAKE 1 TABLET TWICE A DAY 180 tablet 3  . fish oil-omega-3 fatty acids 1000 MG capsule Take 1 g by mouth 2 (two) times daily. Will stop 02/05/12    . lisinopril (PRINIVIL,ZESTRIL) 20 MG tablet Take 1 tablet (20 mg total) by mouth daily. 90 tablet 3  . Multiple Vitamins-Minerals (PRESERVISION AREDS PO) Take 1 tablet by mouth 2 (two) times daily. Will stop 02/05/12    . nitroGLYCERIN (NITROSTAT) 0.4 MG SL tablet Place 0.4 mg under the  tongue every 5 (five) minutes as needed. For chest pain    . OVER THE COUNTER MEDICATION Take 2 tablets by mouth 2 (two) times daily. GlucoCare    . Probiotic Product (PROBIOTIC PO) Take 1 tablet by mouth daily.    . Tamsulosin HCl (FLOMAX) 0.4 MG CAPS Take 0.4 mg by mouth daily after supper.    . zinc gluconate 50 MG tablet Take 50 mg by mouth daily. Stop 02/05/12     No current facility-administered medications for this visit.    Allergies: Allergies  Allergen Reactions  . Penicillins Hives    Social History: The patient  reports that he quit smoking about 52 years ago. He quit smokeless tobacco use about 45 years ago. His smokeless tobacco use included Chew. He reports that he does not drink alcohol or use illicit drugs.   Family History: The patient's family history includes Coronary artery disease in his father; Stroke in his mother.   Review of Systems: Please see the history of present illness.   Otherwise, the review of systems is positive for dizziness, easy bruising, skipped heart beats and palpitations.   All other systems are reviewed and negative.   Physical Exam: VS:  BP 160/70 mmHg  Pulse 54  Ht 5\' 8"  (1.727 m)  Wt 165 lb (74.844 kg)  BMI 25.09 kg/m2 .  BMI Body mass index is 25.09 kg/(m^2).  Wt Readings from Last 3 Encounters:  10/19/14 165 lb (74.844 kg)  12/08/13 158 lb (71.668 kg)  06/03/13 162 lb 1.9 oz (73.537 kg)    General: Pleasant. Well developed, well nourished and in no acute distress.  HEENT: Normal. Neck: Supple, no JVD, carotid bruits, or masses noted.  Cardiac: Irregular irregular rhythm. Rate is bradycardic. No murmurs, rubs, or gallops. No edema.  Respiratory:  Lungs are clear to auscultation bilaterally with normal work of breathing.  GI: Soft and nontender.  MS: No deformity or atrophy. Gait and ROM intact. Skin: Warm and dry. Color is normal.  Neuro:  Strength and sensation are intact and no gross focal deficits noted.  Psych: Alert,  appropriate and with normal affect.   LABORATORY DATA:  EKG:  EKG is ordered today. This demonstrates atrial fibrillation with slow VR - HR is 54.  Lab Results  Component Value Date   WBC 6.2 02/15/2013   HGB 11.1* 02/15/2013   HCT 33.2* 02/15/2013   PLT 221.0 02/15/2013   GLUCOSE 175* 02/15/2013   CHOL 112 06/09/2013  TRIG 69.0 06/09/2013   HDL 43.20 06/09/2013   LDLCALC 55 06/09/2013   ALT 22 06/09/2013   AST 25 06/09/2013   NA 139 02/15/2013   K 4.9 02/15/2013   CL 106 02/15/2013   CREATININE 1.6* 02/15/2013   BUN 32* 02/15/2013   CO2 24 02/15/2013   INR 1.1* 01/03/2013   HGBA1C 6.3* 06/15/2011    BNP (last 3 results) No results for input(s): BNP in the last 8760 hours.  ProBNP (last 3 results) No results for input(s): PROBNP in the last 8760 hours.   Other Studies Reviewed Today:   Assessment/Plan: 1. Recurrent atrial fib - rate is ok - I have left him on his current regimen. He would like to proceed with repeat cardioversion - check lab today. Update his echo. Procedure has been reviewed and he is willing to proceed.   2. HTN - BP fair - better control.   3. PVD  4. CAD - stable. No symptoms.   Current medicines are reviewed with the patient today.  The patient does not have concerns regarding medicines other than what has been noted above.  The following changes have been made:  See above.  Labs/ tests ordered today include:   No orders of the defined types were placed in this encounter.     Disposition:   FU with Dr. Johnsie Cancel as planned in August.      Patient is agreeable to this plan and will call if any problems develop in the interim.   Signed: Burtis Junes, RN, ANP-C 10/19/2014 8:44 AM  [place] [address] [place], [place]  [address] Phone: [phone] Fax: [phone]

## 2014-11-14 NOTE — Telephone Encounter (Signed)
New message ° ° ° ° °Returning a nurses call to get lab results °

## 2014-11-14 NOTE — Interval H&P Note (Signed)
History and Physical Interval Note:  11/14/2014 1:41 PM  [name]  has presented today for surgery, with the diagnosis of A FIB  The various methods of treatment have been discussed with the patient and family. After consideration of risks, benefits and other options for treatment, the patient has consented to  Procedure(s): CARDIOVERSION (N/A) as a surgical intervention .  The patient's history has been reviewed, patient examined, no change in status, stable for surgery.  I have reviewed the patient's chart and labs.  Questions were answered to the patient's satisfaction.     Jenkins Rouge

## 2014-11-15 ENCOUNTER — Ambulatory Visit (HOSPITAL_COMMUNITY): Payer: Medicare Other | Admitting: Anesthesiology

## 2014-11-15 ENCOUNTER — Ambulatory Visit (HOSPITAL_COMMUNITY)
Admission: RE | Admit: 2014-11-15 | Discharge: 2014-11-15 | Disposition: A | Discharge status: Home / Self Care | Payer: Medicare Other | Source: Ambulatory Visit | Attending: Cardiovascular Disease | Admitting: Cardiovascular Disease

## 2014-11-15 ENCOUNTER — Encounter (HOSPITAL_COMMUNITY)
Admission: RE | Disposition: A | Discharge status: Home / Self Care | Payer: Self-pay | Source: Ambulatory Visit | Attending: Cardiovascular Disease

## 2014-11-15 ENCOUNTER — Encounter (HOSPITAL_COMMUNITY): Payer: Self-pay | Admitting: *Deleted

## 2014-11-15 DIAGNOSIS — I4891 Unspecified atrial fibrillation: Secondary | ICD-10-CM | POA: Diagnosis not present

## 2014-11-15 DIAGNOSIS — Z79899 Other long term (current) drug therapy: Secondary | ICD-10-CM | POA: Diagnosis not present

## 2014-11-15 DIAGNOSIS — Z87891 Personal history of nicotine dependence: Secondary | ICD-10-CM | POA: Insufficient documentation

## 2014-11-15 DIAGNOSIS — I739 Peripheral vascular disease, unspecified: Secondary | ICD-10-CM | POA: Insufficient documentation

## 2014-11-15 DIAGNOSIS — Z7982 Long term (current) use of aspirin: Secondary | ICD-10-CM | POA: Insufficient documentation

## 2014-11-15 DIAGNOSIS — E119 Type 2 diabetes mellitus without complications: Secondary | ICD-10-CM | POA: Insufficient documentation

## 2014-11-15 DIAGNOSIS — Z7901 Long term (current) use of anticoagulants: Secondary | ICD-10-CM | POA: Insufficient documentation

## 2014-11-15 DIAGNOSIS — Z85828 Personal history of other malignant neoplasm of skin: Secondary | ICD-10-CM | POA: Diagnosis not present

## 2014-11-15 DIAGNOSIS — Z951 Presence of aortocoronary bypass graft: Secondary | ICD-10-CM | POA: Insufficient documentation

## 2014-11-15 DIAGNOSIS — I2510 Atherosclerotic heart disease of native coronary artery without angina pectoris: Secondary | ICD-10-CM | POA: Diagnosis not present

## 2014-11-15 DIAGNOSIS — E785 Hyperlipidemia, unspecified: Secondary | ICD-10-CM | POA: Diagnosis not present

## 2014-11-15 DIAGNOSIS — I440 Atrioventricular block, first degree: Secondary | ICD-10-CM | POA: Insufficient documentation

## 2014-11-15 DIAGNOSIS — I10 Essential (primary) hypertension: Secondary | ICD-10-CM | POA: Diagnosis not present

## 2014-11-15 HISTORY — PX: CARDIOVERSION: SHX1299

## 2014-11-15 SURGERY — CARDIOVERSION
Anesthesia: Monitor Anesthesia Care

## 2014-11-15 MED ORDER — PROPOFOL 10 MG/ML IV BOLUS
INTRAVENOUS | Status: DC | PRN
  Administered 2014-11-15: 70 mg via INTRAVENOUS

## 2014-11-15 MED ORDER — LIDOCAINE HCL (CARDIAC) 20 MG/ML IV SOLN
INTRAVENOUS | Status: DC | PRN
  Administered 2014-11-15: 100 mg via INTRAVENOUS

## 2014-11-15 MED ORDER — SODIUM CHLORIDE 0.9 % IV SOLN
INTRAVENOUS | Status: DC | PRN
  Administered 2014-11-15: 14:00:00 via INTRAVENOUS

## 2014-11-15 NOTE — CV Procedure (Signed)
DCC:  Anesthesia 70 mg propofol and 100 mg lidocaine  Single 120 J shock converted from afib rate in 80's to NSR and periods of 3:2 wenkebach in the 70's  On Rx Eliquis No immediate neurologic sequelae  Successful [facility]  Jenkins Rouge

## 2014-11-15 NOTE — Transfer of Care (Signed)
Immediate Anesthesia Transfer of Care Note  Patient: [name]  Procedure(s) Performed: Procedure(s): CARDIOVERSION (N/A)  Patient Location: PACU and Endoscopy Unit  Anesthesia Type:General  Level of Consciousness: awake, alert , oriented and patient cooperative  Airway & Oxygen Therapy: Patient Spontanous Breathing and Patient connected to nasal cannula oxygen  Post-op Assessment: Report given to RN, Post -op Vital signs reviewed and stable and Patient moving all extremities  Post vital signs: Reviewed and stable  Last Vitals:  Filed Vitals:   11/15/14 1316  BP: 210/63  Pulse: 70  Temp: [id number] C    Complications: No apparent anesthesia complications

## 2014-11-15 NOTE — Discharge Instructions (Signed)
Stop Coreg F/U Dr Johnsie Cancel or PA next 2-3 weeks office will call Continue Eliquis

## 2014-11-15 NOTE — Anesthesia Postprocedure Evaluation (Signed)
Anesthesia Post Note  Patient: [name]  Procedure(s) Performed: Procedure(s) (LRB): CARDIOVERSION (N/A)  Anesthesia type: general  Patient location: PACU  Post pain: Pain level controlled  Post assessment: Patient's Cardiovascular Status Stable  Last Vitals:  Filed Vitals:   11/15/14 1417  BP: 164/65  Pulse:   Temp: 37 C  Resp: 16    Post vital signs: Reviewed and stable  Level of consciousness: sedated  Complications: No apparent anesthesia complications

## 2014-11-15 NOTE — Anesthesia Preprocedure Evaluation (Signed)
Anesthesia Evaluation  Patient identified by MRN, date of birth, ID band Patient awake    Reviewed: Allergy & Precautions, NPO status   Airway Mallampati: I  TM Distance: >3 FB Neck ROM: Full    Dental   Pulmonary former smoker,    Pulmonary exam normal       Cardiovascular hypertension, Pt. on medications + CAD Normal cardiovascular exam+ dysrhythmias Atrial Fibrillation     Neuro/Psych    GI/Hepatic   Endo/Other  diabetes, Type 2, Oral Hypoglycemic Agents  Renal/GU      Musculoskeletal   Abdominal   Peds  Hematology   Anesthesia Other Findings   Reproductive/Obstetrics                             Anesthesia Physical Anesthesia Plan  ASA: III  Anesthesia Plan: General   Post-op Pain Management:    Induction: Intravenous  Airway Management Planned: Natural Airway  Additional Equipment:   Intra-op Plan:   Post-operative Plan:   Informed Consent: I have reviewed the patients History and Physical, chart, labs and discussed the procedure including the risks, benefits and alternatives for the proposed anesthesia with the patient or authorized representative who has indicated his/her understanding and acceptance.     Plan Discussed with: CRNA and Surgeon  Anesthesia Plan Comments:         Anesthesia Quick Evaluation

## 2014-11-16 ENCOUNTER — Encounter (HOSPITAL_COMMUNITY): Payer: Self-pay | Admitting: Cardiovascular Disease

## 2014-11-21 ENCOUNTER — Telehealth: Payer: Self-pay | Admitting: Cardiovascular Disease

## 2014-11-21 MED ORDER — HYDRALAZINE HCL 50 MG PO TABS: 50.0000 mg | ORAL_TABLET | Freq: Two times a day (BID) | ORAL | Status: DC

## 2014-11-21 NOTE — Telephone Encounter (Signed)
Did you want me to replace Lisinopril with Cozaar or did you mean take Lisinopril in afternoon?

## 2014-11-21 NOTE — Telephone Encounter (Signed)
Spoke with pt and he states that he had a Cardioversion Wednesday last week and then went to the beach Saturday. Pt states that Coreg was stopped after Cardioversion but he has continued Lisinopril. Pt states that while at the beach his BP went up to 180s/80's and HR 90s and pt experiencing headaches. Pt states last night he became dizzy and nauseous. Denies CP, SOB, or swelling. Pt states that he returned home today and he feels much better today.   Denies any symptoms today. BP in right arm at 3pm 158/70, HR 76. BP in left arm at 3:05pm 151/64, HR 79. Informed pt that I would route this information to Dr. Johnsie Cancel and his nurse for review, advisement and follow up.

## 2014-11-21 NOTE — Telephone Encounter (Signed)
Start hydralazine 50 bid.  Take in am and pm take cozaar in afternoon in between

## 2014-11-21 NOTE — Telephone Encounter (Signed)
Take lisinopril in afternoon

## 2014-11-21 NOTE — Telephone Encounter (Signed)
New message   Pt had cardio version on Wednesday July 20th Pt states dr took him off of coreg Heart rate is 84 Pt c/o BP issue: STAT if pt c/o blurred vision, one-sided weakness or slurred speech  1. What are your last 5 BP readings? 184/60 @ 2:20 this afternoon;  180/84 earlier this morning  2. Are you having any other symptoms (ex. Dizziness, headache, blurred vision, passed out)? Slight headache this morning  3. What is your BP issue? bp running higher than normal, pt states had nausea last night  Please call to discuss

## 2014-11-21 NOTE — Telephone Encounter (Signed)
Spoke with pt and informed him of new order for Hydralazine 50mg  bid and to take Lisinopril in the afternoon. Pt asked that I send in a 30 day supply to [place] so he can start medication and send a 90 day supply to mail order pharmacy. Prescriptions sent. Informed pt to continue to monitor BP and HR and call with any issues. Pt verbalized understanding and was in agreement with this plan.

## 2014-12-13 ENCOUNTER — Ambulatory Visit: Payer: Medicare Other | Admitting: Cardiovascular Disease

## 2014-12-19 NOTE — Progress Notes (Signed)
Patient ID: [name], male   DOB: April 29, 1928, 81 y.o.   MRN: [id number]     CARDIOLOGY OFFICE NOTE  Date:  12/21/2014    Druscilla [name] Date of Birth: [birth year] Medical Record [id number]  PCP:  Abigail Miyamoto, MD  Cardiologist:  Andrez Grime chief complaint on file.   History of Present Illness: [name] is a 81 y.o. male with a history of  CAD with prior CABG in 1999 - had SSCP in 2011 and required cath. All of his grafts were patent and his pain has not recurred.  CABG 1999: SVG: IM,OM1,OM2, SVG D1, SVG PDA/PLA and [place] to LAD EF was 45-50% with mild diffuse hypokinesis. His other issues include PVD, claudication, atrial fib/flutter on monitor back in 2014. Started on Eliquis. Apparently has some of his Von Willibrand labs abnormal and was asked to see hematology.  Carotids done 12/07/13 1-39% bilateral disease.   Apparently cardioverted back in October of 2014. Last EKG was NSR.  Saw PA in June and was back on afib.  Successful [place] on  11/15/14   Last echo from 2014 with EF of 55 to 60%.    Past Medical History  Diagnosis Date  . Coronary artery disease     post bypass  . Hypertension   . PVD (peripheral vascular disease)   . CVD (cardiovascular disease)   . Hyperlipidemia   . Diverticular disease   . Bigeminal rhythm   . Hernia   . Easy bruising   . Diabetes mellitus without complication     Past Surgical History  Procedure Laterality Date  . Coronary artery bypass graft  1999  . Carotid endarterectomy  2009/ 1993    left/ right  . Cardiac catheterization  2006  . Eye surgery      eyelid repair  . Skin cancer excision      right ear x 3  . Inguinal hernia repair  02/11/2012    Procedure: LAPAROSCOPIC BILATERAL INGUINAL HERNIA REPAIR;  Surgeon: Pedro Earls, MD;  Location: [facility];  Service: General;  Laterality: Bilateral;  . Hernia repair  02/11/12    Bilateral inguinal hernia repair  . Cardioversion N/A 02/22/2013    Procedure:  CARDIOVERSION;  Surgeon: [name], MD;  Location: [place];  Service: Cardiovascular;  Laterality: N/A;  . Cardioversion N/A 11/15/2014    Procedure: CARDIOVERSION;  Surgeon: [name], MD;  Location: [facility] ENDOSCOPY;  Service: Cardiovascular;  Laterality: N/A;     Medications: Current Outpatient Prescriptions  Medication Sig Dispense Refill  . allopurinol (ZYLOPRIM) 300 MG tablet Take 300 mg by mouth daily.    Marland Kitchen aspirin 81 MG tablet Take 81 mg by mouth at bedtime.     Marland Kitchen atorvastatin (LIPITOR) 20 MG tablet Take 20 mg by mouth at bedtime.    . Cinnamon 500 MG TABS Take 2 tablets by mouth at bedtime.     . Coenzyme Q10 (CO Q 10) 100 MG CAPS Take 1 capsule by mouth daily.     Marland Kitchen ELIQUIS 2.5 MG TABS tablet TAKE 1 TABLET TWICE A DAY 180 tablet 3  . fish oil-omega-3 fatty acids 1000 MG capsule Take 1 g by mouth daily.     . hydrALAZINE (APRESOLINE) 50 MG tablet Take 1 tablet (50 mg total) by mouth 2 (two) times daily. 180 tablet 6  . lisinopril (PRINIVIL,ZESTRIL) 20 MG tablet Take 1 tablet (20 mg total) by mouth daily. 90 tablet 3  .  Multiple Vitamins-Minerals (PRESERVISION AREDS PO) Take 1 tablet by mouth 2 (two) times daily.     . nitroGLYCERIN (NITROSTAT) 0.4 MG SL tablet Place 0.4 mg under the tongue every 5 (five) minutes as needed (up to 3 doses only). For chest pain    . OVER THE COUNTER MEDICATION Take 2 tablets by mouth every evening. GlucoCare    . Probiotic Product (PROBIOTIC PO) Take 1 tablet by mouth daily.    . Tamsulosin HCl (FLOMAX) 0.4 MG CAPS Take 0.4 mg by mouth daily after supper.    . zinc gluconate 50 MG tablet Take 50 mg by mouth daily.      No current facility-administered medications for this visit.    Allergies: Allergies  Allergen Reactions  . Penicillins Hives    Social History: The patient  reports that he quit smoking about 53 years ago. He quit smokeless tobacco use about 45 years ago. His smokeless tobacco use included Chew. He reports that he  does not drink alcohol or use illicit drugs.   Family History: The patient's family history includes Coronary artery disease in his father; Stroke in his mother.   Review of Systems: Please see the history of present illness.   Otherwise, the review of systems is positive for dizziness, easy bruising, skipped heart beats and palpitations.   All other systems are reviewed and negative.   Physical Exam: VS:  There were no vitals taken for this visit. Marland Kitchen  BMI There is no weight on file to calculate BMI.  Wt Readings from Last 3 Encounters:  11/15/14 74.844 kg (165 lb)  10/19/14 74.844 kg (165 lb)  12/08/13 71.668 kg (158 lb)    General: Pleasant. Well developed, well nourished and in no acute distress.  HEENT: Normal. Neck: Supple, no JVD, carotid bruits, or masses noted.  Cardiac: Irregular irregular rhythm. Rate is bradycardic. No murmurs, rubs, or gallops. No edema.  Respiratory:  Lungs are clear to auscultation bilaterally with normal work of breathing.  GI: Soft and nontender.  MS: No deformity or atrophy. Gait and ROM intact. Skin: Warm and dry. Color is normal.  Neuro:  Strength and sensation are intact and no gross focal deficits noted.  Psych: Alert, appropriate and with normal affect.   LABORATORY DATA:  EKG:  EKG is ordered today. This demonstrates atrial fibrillation with slow VR - HR is 54.  12/21/14  SR ate 89  PVC  LPFB inferior T wave changes LVH  Lab Results  Component Value Date   WBC 6.2 11/13/2014   HGB 11.9* 11/13/2014   HCT 35.7* 11/13/2014   PLT 174.0 11/13/2014   GLUCOSE 99 11/13/2014   CHOL 112 06/09/2013   TRIG 69.0 06/09/2013   HDL 43.20 06/09/2013   LDLCALC 55 06/09/2013   ALT 22 06/09/2013   AST 25 06/09/2013   NA 139 11/13/2014   K 5.0 11/13/2014   CL 109 11/13/2014   CREATININE 1.80* 11/13/2014   BUN 35* 11/13/2014   CO2 23 11/13/2014   TSH 2.30 10/19/2014   INR 1.2* 11/13/2014   HGBA1C 6.3* 06/15/2011    BNP (last 3 results) No  results for input(s): BNP in the last 8760 hours.  ProBNP (last 3 results) No results for input(s): PROBNP in the last 8760 hours.   Other Studies Reviewed Today:   Assessment/Plan: 1. Afib:  Initially 2014  Recurrent with successful [facility] November 15 2014  Continue eliquis  Add coreg for PVC and relatively high HR  2. HTN -  BP fair - better control. Stop hydralazine in lieu of adding beta blocker to lisinopril    3. PVD  Medial calcinosis ABI's over 1 in 07/2014  TBI in .5 range slightly abnormal  4. CAD - stable. No symptoms.   Jenkins Rouge

## 2014-12-21 ENCOUNTER — Ambulatory Visit (INDEPENDENT_AMBULATORY_CARE_PROVIDER_SITE_OTHER): Payer: Medicare Other | Admitting: Cardiovascular Disease

## 2014-12-21 ENCOUNTER — Encounter: Payer: Self-pay | Admitting: Cardiovascular Disease

## 2014-12-21 VITALS — BP 136/50 | HR 89 | Ht 68.0 in | Wt 166.0 lb

## 2014-12-21 DIAGNOSIS — I4891 Unspecified atrial fibrillation: Secondary | ICD-10-CM

## 2014-12-21 DIAGNOSIS — I259 Chronic ischemic heart disease, unspecified: Secondary | ICD-10-CM | POA: Diagnosis not present

## 2014-12-21 DIAGNOSIS — R001 Bradycardia, unspecified: Secondary | ICD-10-CM | POA: Diagnosis not present

## 2014-12-21 MED ORDER — CARVEDILOL 6.25 MG PO TABS: 6.2500 mg | ORAL_TABLET | Freq: Two times a day (BID) | ORAL | Status: DC

## 2014-12-21 NOTE — Patient Instructions (Addendum)
Medication Instructions:  STOP  HYDRALAZINE START  CARVEDILOL  6.25 MG 1 TAB TWICE DAILY  Labwork: NONE  Testing/Procedures: NONE  Follow-Up: Your physician recommends that you schedule a follow-up appointment in: 4-5  WEEKS   IS  WILLING  TO DRIVE  TO [place]  OFFICE   Any Other Special Instructions Will Be Listed Below (If Applicable).

## 2015-01-08 DIAGNOSIS — N183 Chronic kidney disease, stage 3 (moderate): Secondary | ICD-10-CM | POA: Diagnosis not present

## 2015-01-08 DIAGNOSIS — E1122 Type 2 diabetes mellitus with diabetic chronic kidney disease: Secondary | ICD-10-CM | POA: Diagnosis not present

## 2015-01-08 DIAGNOSIS — Z23 Encounter for immunization: Secondary | ICD-10-CM | POA: Diagnosis not present

## 2015-01-08 DIAGNOSIS — M109 Gout, unspecified: Secondary | ICD-10-CM | POA: Diagnosis not present

## 2015-01-08 DIAGNOSIS — I129 Hypertensive chronic kidney disease with stage 1 through stage 4 chronic kidney disease, or unspecified chronic kidney disease: Secondary | ICD-10-CM | POA: Diagnosis not present

## 2015-01-15 ENCOUNTER — Other Ambulatory Visit: Payer: Self-pay | Admitting: Cardiovascular Disease

## 2015-01-15 DIAGNOSIS — I6523 Occlusion and stenosis of bilateral carotid arteries: Secondary | ICD-10-CM

## 2015-01-22 ENCOUNTER — Ambulatory Visit (HOSPITAL_COMMUNITY)
Admission: RE | Admit: 2015-01-22 | Discharge: 2015-01-22 | Disposition: A | Discharge status: Home / Self Care | Payer: Medicare Other | Source: Ambulatory Visit | Attending: Cardiovascular Disease | Admitting: Cardiovascular Disease

## 2015-01-22 DIAGNOSIS — I6523 Occlusion and stenosis of bilateral carotid arteries: Secondary | ICD-10-CM | POA: Insufficient documentation

## 2015-01-22 DIAGNOSIS — E785 Hyperlipidemia, unspecified: Secondary | ICD-10-CM | POA: Insufficient documentation

## 2015-01-22 DIAGNOSIS — E119 Type 2 diabetes mellitus without complications: Secondary | ICD-10-CM | POA: Diagnosis not present

## 2015-01-22 DIAGNOSIS — I10 Essential (primary) hypertension: Secondary | ICD-10-CM | POA: Diagnosis not present

## 2015-02-05 ENCOUNTER — Ambulatory Visit: Payer: Medicare Other | Admitting: Cardiovascular Disease

## 2015-02-11 NOTE — Progress Notes (Signed)
Patient ID: [name], male   DOB: 1929/02/05, 81 y.o.   MRN: [id number]     CARDIOLOGY OFFICE NOTE  Date:  02/12/2015    [name] Date of Birth: Mar 01, 1929 Medical Record [id number]  PCP:  Abigail Miyamoto, MD  Cardiologist:  Andrez Grime chief complaint on file.   History of Present Illness: [name] is a 81 y.o. male with a history of  CAD with prior CABG in 1999 - had SSCP in 2011 and required cath. All of his grafts were patent and his pain has not recurred.  CABG 1999: SVG: IM,OM1,OM2, SVG D1, SVG PDA/PLA and [place] to LAD EF was 45-50% with mild diffuse hypokinesis. His other issues include PVD, claudication, atrial fib/flutter on monitor back in 2014. Started on Eliquis. Apparently has some of his Von Willibrand labs abnormal and was asked to see hematology.  Carotids done 12/07/13 1-39% bilateral disease.   Apparently cardioverted back in October of 2014. Last EKG was NSR.  Saw PA in June and was back on afib.  Successful [place] on  11/15/14   Last echo from 2014 with EF of 55 to 60%.   Last visit coreg added and hydralazine stopped  Home BP and HR readings fine    Past Medical History  Diagnosis Date  . Coronary artery disease     post bypass  . Hypertension   . PVD (peripheral vascular disease) ([place])   . CVD (cardiovascular disease)   . Hyperlipidemia   . Diverticular disease   . Bigeminal rhythm   . Hernia   . Easy bruising   . Diabetes mellitus without complication [facility])     Past Surgical History  Procedure Laterality Date  . Coronary artery bypass graft  1999  . Carotid endarterectomy  2009/ 1993    left/ right  . Cardiac catheterization  2006  . Eye surgery      eyelid repair  . Skin cancer excision      right ear x 3  . Inguinal hernia repair  02/11/2012    Procedure: LAPAROSCOPIC BILATERAL INGUINAL HERNIA REPAIR;  Surgeon: Pedro Earls, MD;  [facility];  Service: General;  Laterality: Bilateral;  . Hernia repair   02/11/12    Bilateral inguinal hernia repair  . Cardioversion N/A 02/22/2013    Procedure: CARDIOVERSION;  Surgeon: [name], MD;  Location: [place];  Service: Cardiovascular;  Laterality: N/A;  . Cardioversion N/A 11/15/2014    Procedure: CARDIOVERSION;  Surgeon: [name], MD;  Location: [facility] ENDOSCOPY;  Service: Cardiovascular;  Laterality: N/A;     Medications: Current Outpatient Prescriptions  Medication Sig Dispense Refill  . allopurinol (ZYLOPRIM) 300 MG tablet Take 300 mg by mouth daily.    Marland Kitchen aspirin 81 MG tablet Take 81 mg by mouth at bedtime.     Marland Kitchen atorvastatin (LIPITOR) 20 MG tablet Take 20 mg by mouth at bedtime.    . carvedilol (COREG) 6.25 MG tablet Take 1 tablet (6.25 mg total) by mouth 2 (two) times daily. 60 tablet 2  . Cinnamon 500 MG TABS Take 2 tablets by mouth at bedtime.     . Coenzyme Q10 (CO Q 10) 100 MG CAPS Take 1 capsule by mouth daily.     Marland Kitchen ELIQUIS 2.5 MG TABS tablet TAKE 1 TABLET TWICE A DAY 180 tablet 3  . fish oil-omega-3 fatty acids 1000 MG capsule Take 1 g by mouth daily.     Marland Kitchen  lisinopril (PRINIVIL,ZESTRIL) 20 MG tablet Take 1 tablet (20 mg total) by mouth daily. 90 tablet 3  . Multiple Vitamins-Minerals (PRESERVISION AREDS PO) Take 1 tablet by mouth 2 (two) times daily.     . nitroGLYCERIN (NITROSTAT) 0.4 MG SL tablet Place 0.4 mg under the tongue every 5 (five) minutes as needed (up to 3 doses only). For chest pain    . OVER THE COUNTER MEDICATION Take 2 tablets by mouth every evening. GlucoCare    . Probiotic Product (PROBIOTIC PO) Take 1 tablet by mouth daily.    . Tamsulosin HCl (FLOMAX) 0.4 MG CAPS Take 0.4 mg by mouth daily after supper.    . zinc gluconate 50 MG tablet Take 50 mg by mouth daily.      No current facility-administered medications for this visit.    Allergies: Allergies  Allergen Reactions  . Penicillins Hives    Social History: The patient  reports that he quit smoking about 53 years ago. He quit smokeless  tobacco use about 46 years ago. His smokeless tobacco use included Chew. He reports that he does not drink alcohol or use illicit drugs.   Family History: The patient's family history includes Coronary artery disease in his father; Stroke in his mother.   Review of Systems: Please see the history of present illness.   Otherwise, the review of systems is positive for dizziness, easy bruising, skipped heart beats and palpitations.   All other systems are reviewed and negative.   Physical Exam: VS:  BP 158/74 mmHg  Pulse 70  Ht 5\' 8"  (1.727 m)  Wt 71.759 kg (158 lb 3.2 oz)  BMI 24.06 kg/m2  SpO2 99% .  BMI Body mass index is 24.06 kg/(m^2).  Wt Readings from Last 3 Encounters:  02/12/15 71.759 kg (158 lb 3.2 oz)  12/21/14 75.297 kg (166 lb)  11/15/14 74.844 kg (165 lb)    General: Pleasant. Well developed, well nourished and in no acute distress.  HEENT: Normal. Neck: Supple, no JVD, carotid bruits, or masses noted.  Cardiac: Irregular irregular rhythm. Rate is bradycardic. No murmurs, rubs, or gallops. No edema.  Respiratory:  Lungs are clear to auscultation bilaterally with normal work of breathing.  GI: Soft and nontender.  MS: No deformity or atrophy. Gait and ROM intact. Skin: Warm and dry. Color is normal.  Neuro:  Strength and sensation are intact and no gross focal deficits noted.  Psych: Alert, appropriate and with normal affect.   LABORATORY DATA:  EKG:  EKG 2014   atrial fibrillation with slow VR - HR is 54.  12/21/14  SR ate 89  PVC  LPFB inferior T wave changes LVH  Lab Results  Component Value Date   WBC 6.2 11/13/2014   HGB 11.9* 11/13/2014   HCT 35.7* 11/13/2014   PLT 174.0 11/13/2014   GLUCOSE 99 11/13/2014   CHOL 112 06/09/2013   TRIG 69.0 06/09/2013   HDL 43.20 06/09/2013   LDLCALC 55 06/09/2013   ALT 22 06/09/2013   AST 25 06/09/2013   NA 139 11/13/2014   K 5.0 11/13/2014   CL 109 11/13/2014   CREATININE 1.80* 11/13/2014   BUN 35* 11/13/2014    CO2 23 11/13/2014   TSH 2.30 10/19/2014   INR 1.2* 11/13/2014   HGBA1C 6.3* 06/15/2011    BNP (last 3 results) No results for input(s): BNP in the last 8760 hours.  ProBNP (last 3 results) No results for input(s): PROBNP in the last 8760 hours.   Other  Studies Reviewed Today:   Assessment/Plan: 1. Afib:  Initially 2014  Recurrent with successful [facility] November 15 2014  Continue eliquis  Continue beta blocker   2. HTN - BP fair - better control. Hydralazine stopped and on lisinopril and coreg   3. PVD  Medial calcinosis ABI's over 1 in 07/2014  TBI in .5 range slightly abnormal  4. CAD - stable. No symptoms.   F/u with me in 6 months   Jenkins Rouge

## 2015-02-12 ENCOUNTER — Ambulatory Visit (INDEPENDENT_AMBULATORY_CARE_PROVIDER_SITE_OTHER): Payer: Medicare Other | Admitting: Cardiovascular Disease

## 2015-02-12 ENCOUNTER — Encounter: Payer: Self-pay | Admitting: Cardiovascular Disease

## 2015-02-12 VITALS — BP 158/74 | HR 70 | Ht 68.0 in | Wt 158.2 lb

## 2015-02-12 DIAGNOSIS — I259 Chronic ischemic heart disease, unspecified: Secondary | ICD-10-CM | POA: Diagnosis not present

## 2015-02-12 DIAGNOSIS — I10 Essential (primary) hypertension: Secondary | ICD-10-CM | POA: Diagnosis not present

## 2015-02-12 NOTE — Patient Instructions (Signed)
Your physician wants you to follow-up in: 6 months with Dr. Johnsie Cancel. You will receive a reminder letter in the mail two months in advance. If you don't receive a letter, please call our office to schedule the follow-up appointment.  Your physician recommends that you continue on your current medications as directed. Please refer to the Current Medication list given to you today.  Thanks for choosing [facility]!!!

## 2015-02-13 DIAGNOSIS — H43813 Vitreous degeneration, bilateral: Secondary | ICD-10-CM | POA: Diagnosis not present

## 2015-02-13 DIAGNOSIS — H353114 Nonexudative age-related macular degeneration, right eye, advanced atrophic with subfoveal involvement: Secondary | ICD-10-CM | POA: Diagnosis not present

## 2015-02-13 DIAGNOSIS — H353123 Nonexudative age-related macular degeneration, left eye, advanced atrophic without subfoveal involvement: Secondary | ICD-10-CM | POA: Diagnosis not present

## 2015-02-26 ENCOUNTER — Other Ambulatory Visit: Payer: Self-pay | Admitting: *Deleted

## 2015-02-26 MED ORDER — APIXABAN 2.5 MG PO TABS: 2.5000 mg | ORAL_TABLET | Freq: Two times a day (BID) | ORAL | Status: DC

## 2015-02-27 ENCOUNTER — Other Ambulatory Visit: Payer: Self-pay

## 2015-02-27 ENCOUNTER — Telehealth: Payer: Self-pay | Admitting: Cardiovascular Disease

## 2015-02-27 MED ORDER — APIXABAN 2.5 MG PO TABS: 2.5000 mg | ORAL_TABLET | Freq: Two times a day (BID) | ORAL | Status: DC

## 2015-02-27 NOTE — Telephone Encounter (Signed)
New message    STAT if patient is at the pharmacy , call can be transferred to refill team.   1. Which medications need to be refilled? eliquis 2.5 mg   2. Which pharmacy/location is medication to be sent to? Mail order  [facility] [phone]   3. Do they need a 30 day or 90 day supply?  90 days supply

## 2015-03-05 ENCOUNTER — Telehealth: Payer: Self-pay | Admitting: Cardiovascular Disease

## 2015-03-05 MED ORDER — CARVEDILOL 6.25 MG PO TABS: 6.2500 mg | ORAL_TABLET | Freq: Two times a day (BID) | ORAL | Status: DC

## 2015-03-05 NOTE — Telephone Encounter (Signed)
SPOKE WITH PT . PT   NOTICED B/P  ELEVATED  3 DAYS  AGO  READINGS  180-200/50 -84 ALSO   COMPLAINED WITH NAUSEA.  NAUSEA  HAS  GONE  B/P RUNNING SOMEWHAT  BETTER BUT  PT INCREASED  CARVEDILOL TO 12.5 MG BID  ON OWN  B/P  TODAY IS RANGING   125-146 /58  HEART   RATE 63  INFORMED PT  TO   RETURN  TO  CARVEDILOL  6.25 MG  AND MONITOR  B/P  OVER NEXT  FEW  DAYS  AND   TO  CALL Friday  IF  B/P CREEPS  BACK  UP WILL FORWARD TO  DR Johnsie Cancel  FOR  REVIEW .Adonis Housekeeper

## 2015-03-05 NOTE — Telephone Encounter (Signed)
New message     Pt c/o BP issue: STAT if pt c/o blurred vision, one-sided weakness or slurred speech  1. What are your last 5 BP readings? 174/68, HR 69, 155/60, 150/53,  2. Are you having any other symptoms (ex. Dizziness, headache, blurred vision, passed out)? no 3. What is your BP issue?  Pt want to talk to the nurse about his bp.  He thinks it is a little high

## 2015-04-13 DIAGNOSIS — E113292 Type 2 diabetes mellitus with mild nonproliferative diabetic retinopathy without macular edema, left eye: Secondary | ICD-10-CM | POA: Diagnosis not present

## 2015-04-13 DIAGNOSIS — H40012 Open angle with borderline findings, low risk, left eye: Secondary | ICD-10-CM | POA: Diagnosis not present

## 2015-04-13 DIAGNOSIS — H40011 Open angle with borderline findings, low risk, right eye: Secondary | ICD-10-CM | POA: Diagnosis not present

## 2015-04-13 DIAGNOSIS — H52203 Unspecified astigmatism, bilateral: Secondary | ICD-10-CM | POA: Diagnosis not present

## 2015-05-06 ENCOUNTER — Encounter: Payer: Self-pay | Admitting: Cardiovascular Disease

## 2015-05-17 DIAGNOSIS — L853 Xerosis cutis: Secondary | ICD-10-CM | POA: Diagnosis not present

## 2015-05-17 DIAGNOSIS — Z85828 Personal history of other malignant neoplasm of skin: Secondary | ICD-10-CM | POA: Diagnosis not present

## 2015-05-17 DIAGNOSIS — L578 Other skin changes due to chronic exposure to nonionizing radiation: Secondary | ICD-10-CM | POA: Diagnosis not present

## 2015-05-17 DIAGNOSIS — L821 Other seborrheic keratosis: Secondary | ICD-10-CM | POA: Diagnosis not present

## 2015-05-17 DIAGNOSIS — D1801 Hemangioma of skin and subcutaneous tissue: Secondary | ICD-10-CM | POA: Diagnosis not present

## 2015-05-17 DIAGNOSIS — L570 Actinic keratosis: Secondary | ICD-10-CM | POA: Diagnosis not present

## 2015-07-09 ENCOUNTER — Encounter: Payer: Self-pay | Admitting: Family Medicine

## 2015-07-09 DIAGNOSIS — M109 Gout, unspecified: Secondary | ICD-10-CM | POA: Diagnosis not present

## 2015-07-09 DIAGNOSIS — I739 Peripheral vascular disease, unspecified: Secondary | ICD-10-CM | POA: Diagnosis not present

## 2015-07-09 DIAGNOSIS — N183 Chronic kidney disease, stage 3 (moderate): Secondary | ICD-10-CM | POA: Diagnosis not present

## 2015-07-09 DIAGNOSIS — I129 Hypertensive chronic kidney disease with stage 1 through stage 4 chronic kidney disease, or unspecified chronic kidney disease: Secondary | ICD-10-CM | POA: Diagnosis not present

## 2015-07-09 DIAGNOSIS — M545 Low back pain: Secondary | ICD-10-CM | POA: Diagnosis not present

## 2015-07-09 DIAGNOSIS — E1122 Type 2 diabetes mellitus with diabetic chronic kidney disease: Secondary | ICD-10-CM | POA: Diagnosis not present

## 2015-07-09 DIAGNOSIS — E7800 Pure hypercholesterolemia, unspecified: Secondary | ICD-10-CM | POA: Diagnosis not present

## 2015-08-16 DIAGNOSIS — H353114 Nonexudative age-related macular degeneration, right eye, advanced atrophic with subfoveal involvement: Secondary | ICD-10-CM | POA: Diagnosis not present

## 2015-08-16 DIAGNOSIS — H353123 Nonexudative age-related macular degeneration, left eye, advanced atrophic without subfoveal involvement: Secondary | ICD-10-CM | POA: Diagnosis not present

## 2015-08-20 NOTE — Progress Notes (Signed)
Patient ID: [name], male   DOB: 12-15-1928, 82 y.o.   MRN: [id number]     CARDIOLOGY OFFICE NOTE  Date:  08/20/2015    [name] Date of Birth: 1929/04/27 Medical Record [id number]  PCP:  Abigail Miyamoto, MD  Cardiologist:  Andrez Grime chief complaint on file.   History of Present Illness: [name] is a 82 y.o. male with a history of  CAD with prior CABG in 1999 - had SSCP in 2011 and required cath. All of his grafts were patent and his pain has not recurred.  CABG 1999: SVG: IM,OM1,OM2, SVG D1, SVG PDA/PLA and [place] to LAD EF was 45-50% with mild diffuse hypokinesis. His other issues include PVD, claudication, atrial fib/flutter on monitor back in 2014. Started on Eliquis. Apparently has some of his Von Willibrand labs abnormal and was asked to see hematology.  Carotids done 12/07/13 1-39% bilateral disease.   Apparently cardioverted back in October of 2014. Last EKG was NSR.  Saw PA in June and was back on afib.  Successful [place] on  11/15/14   Last echo from 2014 with EF of 55 to 60%.   Last visit coreg added and hydralazine stopped  Home BP and HR readings fine   Goes to [employer]. Gets some exertional dyspnea. Also after working out gets "white flash" and pre syncope at times. This is usually before He takes his afternoon ACE   Past Medical History  Diagnosis Date  . Coronary artery disease     post bypass  . Hypertension   . PVD (peripheral vascular disease) ([place])   . CVD (cardiovascular disease)   . Hyperlipidemia   . Diverticular disease   . Bigeminal rhythm   . Hernia   . Easy bruising   . Diabetes mellitus without complication [facility])     Past Surgical History  Procedure Laterality Date  . Coronary artery bypass graft  1999  . Carotid endarterectomy  2009/ 1993    left/ right  . Cardiac catheterization  2006  . Eye surgery      eyelid repair  . Skin cancer excision      right ear x 3  . Inguinal hernia repair  02/11/2012   Procedure: LAPAROSCOPIC BILATERAL INGUINAL HERNIA REPAIR;  Surgeon: Pedro Earls, MD;  [facility];  Service: General;  Laterality: Bilateral;  . Hernia repair  02/11/12    Bilateral inguinal hernia repair  . Cardioversion N/A 02/22/2013    Procedure: CARDIOVERSION;  Surgeon: [name], MD;  Location: [place];  Service: Cardiovascular;  Laterality: N/A;  . Cardioversion N/A 11/15/2014    Procedure: CARDIOVERSION;  Surgeon: [name], MD;  Location: [facility] ENDOSCOPY;  Service: Cardiovascular;  Laterality: N/A;     Medications: Current Outpatient Prescriptions  Medication Sig Dispense Refill  . allopurinol (ZYLOPRIM) 300 MG tablet Take 300 mg by mouth daily.    Marland Kitchen apixaban (ELIQUIS) 2.5 MG TABS tablet Take 1 tablet (2.5 mg total) by mouth 2 (two) times daily. 180 tablet 1  . aspirin 81 MG tablet Take 81 mg by mouth at bedtime.     Marland Kitchen atorvastatin (LIPITOR) 20 MG tablet Take 20 mg by mouth at bedtime.    . carvedilol (COREG) 6.25 MG tablet Take 1 tablet (6.25 mg total) by mouth 2 (two) times daily. 60 tablet 2  . Cinnamon 500 MG TABS Take 2 tablets by mouth at bedtime.     . Coenzyme Q10 (  CO Q 10) 100 MG CAPS Take 1 capsule by mouth daily.     . fish oil-omega-3 fatty acids 1000 MG capsule Take 1 g by mouth daily.     Marland Kitchen lisinopril (PRINIVIL,ZESTRIL) 20 MG tablet Take 1 tablet (20 mg total) by mouth daily. 90 tablet 3  . Multiple Vitamins-Minerals (PRESERVISION AREDS PO) Take 1 tablet by mouth 2 (two) times daily.     . nitroGLYCERIN (NITROSTAT) 0.4 MG SL tablet Place 0.4 mg under the tongue every 5 (five) minutes as needed (up to 3 doses only). For chest pain    . OVER THE COUNTER MEDICATION Take 2 tablets by mouth every evening. GlucoCare    . Probiotic Product (PROBIOTIC PO) Take 1 tablet by mouth daily.    . Tamsulosin HCl (FLOMAX) 0.4 MG CAPS Take 0.4 mg by mouth daily after supper.    . zinc gluconate 50 MG tablet Take 50 mg by mouth daily.      No current  facility-administered medications for this visit.    Allergies: Allergies  Allergen Reactions  . Penicillins Hives    Social History: The patient  reports that he quit smoking about 53 years ago. He quit smokeless tobacco use about 46 years ago. His smokeless tobacco use included Chew. He reports that he does not drink alcohol or use illicit drugs.   Family History: The patient's family history includes Coronary artery disease in his father; Stroke in his mother.   Review of Systems: Please see the history of present illness.   Otherwise, the review of systems is positive for dizziness, easy bruising, skipped heart beats and palpitations.   All other systems are reviewed and negative.   Physical Exam: VS:  There were no vitals taken for this visit. Marland Kitchen  BMI There is no weight on file to calculate BMI.  Wt Readings from Last 3 Encounters:  02/12/15 71.759 kg (158 lb 3.2 oz)  12/21/14 75.297 kg (166 lb)  11/15/14 74.844 kg (165 lb)    General: Pleasant. Well developed, well nourished and in no acute distress.  HEENT: Normal. Neck: Supple, no JVD, bilateral  carotid bruits with CEA , or masses noted.  Cardiac: Irregular irregular rhythm. Rate is bradycardic. No murmurs, rubs, or gallops. No edema.  Respiratory:  Lungs are clear to auscultation bilaterally with normal work of breathing.  GI: Soft and nontender.  Aorta palpable with bruit Bilateral femoral bruits but popliteals plus 3  MS: No deformity or atrophy. Gait and ROM intact. Skin: Warm and dry. Color is normal.  Neuro:  Strength and sensation are intact and no gross focal deficits noted.  Psych: Alert, appropriate and with normal affect.   LABORATORY DATA:  EKG:  EKG 2014   atrial fibrillation with slow VR - HR is 54.  12/21/14  SR ate 89  PVC  LPFB inferior T wave changes LVH  Lab Results  Component Value Date   WBC 6.2 11/13/2014   HGB 11.9* 11/13/2014   HCT 35.7* 11/13/2014   PLT 174.0 11/13/2014   GLUCOSE 99  11/13/2014   CHOL 112 06/09/2013   TRIG 69.0 06/09/2013   HDL 43.20 06/09/2013   LDLCALC 55 06/09/2013   ALT 22 06/09/2013   AST 25 06/09/2013   NA 139 11/13/2014   K 5.0 11/13/2014   CL 109 11/13/2014   CREATININE 1.80* 11/13/2014   BUN 35* 11/13/2014   CO2 23 11/13/2014   TSH 2.30 10/19/2014   INR 1.2* 11/13/2014   HGBA1C 6.3*  06/15/2011    BNP (last 3 results) No results for input(s): BNP in the last 8760 hours.  ProBNP (last 3 results) No results for input(s): PROBNP in the last 8760 hours.   Other Studies Reviewed Today:   Assessment/Plan: 1. Afib:  Initially 2014  Recurrent with successful [facility] November 15 2014  Continue eliquis  Given age and renal function he is on lower dose Continue beta blocker   2. HTN - BP fair - better control. Hydralazine stopped and on lisinopril and coreg   3. PVD  Medial calcinosis ABI's over 1 in 07/2014  TBI in .5 range slightly abnormal On exam he has abdominal and bilateral femoral bruits Will order Abdominal US and LE arterial duplex Suspect he will need PV referral  4. CAD - CABG 1999 grafts patent cath 2011  Some exertional dsypnea and pre syncope with Silver Sneakers f/u lexiscan myovue. Cannot walk On treadmill due to "claudication"  5. PreSyncope:  Not postural in office. Doubt low BP.  F/U carotid duplex post bilateral CEA last duplex 2015 plaque no stenosis and no VBI  6. Avoid diuretics for BP control Avoid NSAI's  Have adjusted dose of NOAC   His primary at [facility] Dr [place] has left. He would like to be referred to [facility] practice personally called office manager to help arrange New patient visit with Dr Raoul Pitch or Ernestine Conrad    F/u with me in 3 months   Jenkins Rouge

## 2015-08-22 ENCOUNTER — Encounter: Payer: Self-pay | Admitting: Cardiovascular Disease

## 2015-08-22 ENCOUNTER — Encounter: Payer: Self-pay | Admitting: *Deleted

## 2015-08-22 ENCOUNTER — Ambulatory Visit (INDEPENDENT_AMBULATORY_CARE_PROVIDER_SITE_OTHER): Payer: Medicare Other | Admitting: Cardiovascular Disease

## 2015-08-22 VITALS — BP 134/62 | HR 71 | Ht 68.0 in | Wt 159.0 lb

## 2015-08-22 DIAGNOSIS — R079 Chest pain, unspecified: Secondary | ICD-10-CM

## 2015-08-22 DIAGNOSIS — R0989 Other specified symptoms and signs involving the circulatory and respiratory systems: Secondary | ICD-10-CM

## 2015-08-22 NOTE — Patient Instructions (Signed)
Your physician wants you to follow-up in: 3 Month with Dr. Johnsie Cancel. You will receive a reminder letter in the mail two months in advance. If you don't receive a letter, please call our office to schedule the follow-up appointment.  Your physician recommends that you continue on your current medications as directed. Please refer to the Current Medication list given to you today.  Your physician has requested that you have a lexiscan myoview. For further information please visit [email or web address]. Please follow instruction sheet, as given.  Your physician has requested that you have a carotid duplex. This test is an ultrasound of the carotid arteries in your neck. It looks at blood flow through these arteries that supply the brain with blood. Allow one hour for this exam. There are no restrictions or special instructions.  Your physician has requested that you have an abdominal aorta duplex. During this test, an ultrasound is used to evaluate the aorta. Allow 30 minutes for this exam. Do not eat after midnight the day before and avoid carbonated beverages  Your physician has requested that you have a lower or upper extremity arterial duplex. This test is an ultrasound of the arteries in the legs or arms. It looks at arterial blood flow in the legs and arms. Allow one hour for Lower and Upper Arterial scans. There are no restrictions or special instructions  If you need a refill on your cardiac medications before your next appointment, please call your pharmacy.  Thank you for choosing [facility]!

## 2015-08-23 ENCOUNTER — Telehealth (HOSPITAL_COMMUNITY): Payer: Self-pay | Admitting: *Deleted

## 2015-08-23 NOTE — Telephone Encounter (Signed)
Patient given detailed instructions per Myocardial Perfusion Study Information Sheet for the test on 08/28/15. Patient notified to arrive 15 minutes early and that it is imperative to arrive on time for appointment to keep from having the test rescheduled.  If you need to cancel or reschedule your appointment, please call the office within 24 hours of your appointment. Failure to do so may result in a cancellation of your appointment, and a $50 no show fee. Patient verbalized understanding.Hubbard Robinson, RN

## 2015-08-26 ENCOUNTER — Other Ambulatory Visit: Payer: Self-pay | Admitting: Cardiovascular Disease

## 2015-08-28 ENCOUNTER — Other Ambulatory Visit: Payer: Self-pay | Admitting: Cardiovascular Disease

## 2015-08-28 ENCOUNTER — Ambulatory Visit (HOSPITAL_COMMUNITY): Payer: Medicare Other | Attending: Internal Medicine

## 2015-08-28 DIAGNOSIS — I6523 Occlusion and stenosis of bilateral carotid arteries: Secondary | ICD-10-CM

## 2015-08-28 DIAGNOSIS — R0609 Other forms of dyspnea: Secondary | ICD-10-CM | POA: Insufficient documentation

## 2015-08-28 DIAGNOSIS — R9439 Abnormal result of other cardiovascular function study: Secondary | ICD-10-CM | POA: Diagnosis not present

## 2015-08-28 DIAGNOSIS — I10 Essential (primary) hypertension: Secondary | ICD-10-CM | POA: Diagnosis not present

## 2015-08-28 DIAGNOSIS — I739 Peripheral vascular disease, unspecified: Secondary | ICD-10-CM | POA: Insufficient documentation

## 2015-08-28 DIAGNOSIS — R079 Chest pain, unspecified: Secondary | ICD-10-CM | POA: Diagnosis not present

## 2015-08-28 DIAGNOSIS — R0989 Other specified symptoms and signs involving the circulatory and respiratory systems: Secondary | ICD-10-CM

## 2015-08-28 DIAGNOSIS — R55 Syncope and collapse: Secondary | ICD-10-CM | POA: Insufficient documentation

## 2015-08-28 DIAGNOSIS — I779 Disorder of arteries and arterioles, unspecified: Secondary | ICD-10-CM | POA: Diagnosis not present

## 2015-08-28 LAB — MYOCARDIAL PERFUSION IMAGING
LHR: 0.26
LV dias vol: 120 mL (ref 62–150)
LVSYSVOL: 36 mL
Peak HR: 64 {beats}/min
Rest HR: 64 {beats}/min
SDS: 7
SRS: 2
SSS: 8
TID: 0.89

## 2015-08-28 MED ORDER — TECHNETIUM TC 99M SESTAMIBI GENERIC - CARDIOLITE
10.3000 | Freq: Once | INTRAVENOUS | Status: AC | PRN
  Administered 2015-08-28: 10 via INTRAVENOUS

## 2015-08-28 MED ORDER — TECHNETIUM TC 99M SESTAMIBI GENERIC - CARDIOLITE
32.6000 | Freq: Once | INTRAVENOUS | Status: AC | PRN
  Administered 2015-08-28: 33 via INTRAVENOUS

## 2015-08-28 MED ORDER — REGADENOSON 0.4 MG/5ML IV SOLN
0.4000 mg | Freq: Once | INTRAVENOUS | Status: AC
  Administered 2015-08-28: 0.4 mg via INTRAVENOUS

## 2015-09-03 ENCOUNTER — Other Ambulatory Visit: Payer: Self-pay | Admitting: Cardiovascular Disease

## 2015-09-03 ENCOUNTER — Ambulatory Visit (HOSPITAL_COMMUNITY)
Admission: RE | Admit: 2015-09-03 | Discharge: 2015-09-03 | Disposition: A | Discharge status: Home / Self Care | Payer: Medicare Other | Source: Ambulatory Visit | Attending: Cardiovascular Disease | Admitting: Cardiovascular Disease

## 2015-09-03 ENCOUNTER — Ambulatory Visit (HOSPITAL_COMMUNITY)
Admission: RE | Admit: 2015-09-03 | Discharge: 2015-09-03 | Disposition: A | Discharge status: Home / Self Care | Payer: Medicare Other | Source: Ambulatory Visit | Attending: Cardiology | Admitting: Cardiology

## 2015-09-03 DIAGNOSIS — I6523 Occlusion and stenosis of bilateral carotid arteries: Secondary | ICD-10-CM

## 2015-09-03 DIAGNOSIS — I700 Atherosclerosis of aorta: Secondary | ICD-10-CM | POA: Insufficient documentation

## 2015-09-03 DIAGNOSIS — R938 Abnormal findings on diagnostic imaging of other specified body structures: Secondary | ICD-10-CM | POA: Insufficient documentation

## 2015-09-03 DIAGNOSIS — R0989 Other specified symptoms and signs involving the circulatory and respiratory systems: Secondary | ICD-10-CM | POA: Insufficient documentation

## 2015-09-03 DIAGNOSIS — I10 Essential (primary) hypertension: Secondary | ICD-10-CM | POA: Insufficient documentation

## 2015-09-03 DIAGNOSIS — I708 Atherosclerosis of other arteries: Secondary | ICD-10-CM | POA: Diagnosis not present

## 2015-09-03 DIAGNOSIS — I739 Peripheral vascular disease, unspecified: Secondary | ICD-10-CM | POA: Diagnosis not present

## 2015-09-03 DIAGNOSIS — E785 Hyperlipidemia, unspecified: Secondary | ICD-10-CM | POA: Insufficient documentation

## 2015-09-03 DIAGNOSIS — E119 Type 2 diabetes mellitus without complications: Secondary | ICD-10-CM | POA: Insufficient documentation

## 2015-10-09 NOTE — Progress Notes (Signed)
Patient ID: [name], male   DOB: 03-29-1929, 82 y.o.   MRN: [id number]     CARDIOLOGY OFFICE NOTE  Date:  10/10/2015    [name] Date of Birth: [birth year] Medical Record [id number]  PCP:  Abigail Miyamoto, MD  Cardiologist:  Andrez Grime chief complaint on file.   History of Present Illness: [name] is a 82 y.o. male with a history of  CAD with prior CABG in 1999 - had SSCP in 2011 and required cath. All of his grafts were patent and his pain has not recurred.  CABG 1999: SVG: IM,OM1,OM2, SVG D1, SVG PDA/PLA and [place] to LAD EF was 45-50% with mild diffuse hypokinesis. His other issues include PVD, claudication, atrial fib/flutter on monitor back in 2014. Started on Eliquis. Apparently has some of his [place] labs abnormal and was asked to see hematology.  Carotids done 12/07/13 1-39% bilateral disease.   Apparently cardioverted back in October of 2014. Last EKG was NSR.  Saw PA in June and was back on afib.  Successful [place] on  11/15/14   Last echo from 2014 with EF of 55 to 60%.   Last visit coreg added and hydralazine stopped  Home BP and HR readings fine   Goes to [employer]. Gets some exertional dyspnea. Also after working out gets "white flash" and pre syncope at times. This is usually before He takes his afternoon ACE  F/U myovue 08/28/15 scar no ischemia  Nuclear stress EF: 70%.  There was no ST segment deviation noted during stress.  Defect 1: There is a medium defect of moderate severity present in the basal inferolateral, mid inferolateral and apical lateral location.  Findings consistent with prior myocardial infarction with a small amount of peri-infarct ischemia.  This is a low risk study.  The left ventricular ejection fraction is hyperdynamic (>65%).  Duplex 09/03/15  1-39% ICA normal vertebral flow   Wife bothered by sciatica a lot   Past Medical History  Diagnosis Date  . Coronary artery disease     post bypass  .  Hypertension   . PVD (peripheral vascular disease) ([place])   . CVD (cardiovascular disease)   . Hyperlipidemia   . Diverticular disease   . Bigeminal rhythm   . Hernia   . Easy bruising   . Diabetes mellitus without complication [facility])     Past Surgical History  Procedure Laterality Date  . Coronary artery bypass graft  1999  . Carotid endarterectomy  2009/ 1993    left/ right  . Cardiac catheterization  2006  . Eye surgery      eyelid repair  . Skin cancer excision      right ear x 3  . Inguinal hernia repair  02/11/2012    Procedure: LAPAROSCOPIC BILATERAL INGUINAL HERNIA REPAIR;  Surgeon: Pedro Earls, MD;  Location: [facility];  Service: General;  Laterality: Bilateral;  . Hernia repair  02/11/12    Bilateral inguinal hernia repair  . Cardioversion N/A 02/22/2013    Procedure: CARDIOVERSION;  Surgeon: [name], MD;  Location: [place];  Service: Cardiovascular;  Laterality: N/A;  . Cardioversion N/A 11/15/2014    Procedure: CARDIOVERSION;  Surgeon: [name], MD;  Location[facility];  Service: Cardiovascular;  Laterality: N/A;     Medications: Current Outpatient Prescriptions  Medication Sig Dispense Refill  . allopurinol (ZYLOPRIM) 300 MG tablet Take 150 mg by mouth daily.     Marland Kitchen aspirin  81 MG tablet Take 81 mg by mouth at bedtime.     Marland Kitchen atorvastatin (LIPITOR) 20 MG tablet Take 20 mg by mouth at bedtime.    . carvedilol (COREG) 6.25 MG tablet Take 1 tablet (6.25 mg total) by mouth 2 (two) times daily. 60 tablet 2  . Cinnamon 500 MG TABS Take 2 tablets by mouth at bedtime.     . Coenzyme Q10 (CO Q 10) 100 MG CAPS Take 1 capsule by mouth daily.     Marland Kitchen ELIQUIS 2.5 MG TABS tablet TAKE 1 TABLET TWICE A DAY 180 tablet 1  . fish oil-omega-3 fatty acids 1000 MG capsule Take 1 g by mouth daily.     Marland Kitchen lisinopril (PRINIVIL,ZESTRIL) 20 MG tablet Take 1 tablet (20 mg total) by mouth daily. 90 tablet 3  . Multiple Vitamins-Minerals (PRESERVISION AREDS PO) Take 1 tablet  by mouth 2 (two) times daily.     . nitroGLYCERIN (NITROSTAT) 0.4 MG SL tablet Place 0.4 mg under the tongue every 5 (five) minutes as needed (up to 3 doses only). For chest pain    . OVER THE COUNTER MEDICATION Take 2 tablets by mouth every evening. GlucoCare    . Probiotic Product (PROBIOTIC PO) Take 1 tablet by mouth daily.    . Tamsulosin HCl (FLOMAX) 0.4 MG CAPS Take 0.4 mg by mouth daily after supper.    . zinc gluconate 50 MG tablet Take 50 mg by mouth daily.      No current facility-administered medications for this visit.    Allergies: Allergies  Allergen Reactions  . Penicillins Hives    Social History: The patient  reports that he quit smoking about 53 years ago. He quit smokeless tobacco use about 46 years ago. His smokeless tobacco use included Chew. He reports that he does not drink alcohol or use illicit drugs.   Family History: The patient's family history includes Coronary artery disease in his father; Stroke in his mother.   Review of Systems: Please see the history of present illness.   Otherwise, the review of systems is positive for dizziness, easy bruising, skipped heart beats and palpitations.   All other systems are reviewed and negative.   Physical Exam: VS:  BP 140/54 mmHg  Pulse 70  Ht 5\' 8"  (1.727 m)  Wt 70.761 kg (156 lb)  BMI 23.73 kg/m2  SpO2 99% .  BMI Body mass index is 23.73 kg/(m^2).  Wt Readings from Last 3 Encounters:  10/10/15 70.761 kg (156 lb)  08/22/15 72.122 kg (159 lb)  02/12/15 71.759 kg (158 lb 3.2 oz)    General: Pleasant. Well developed, well nourished and in no acute distress.  HEENT: Normal. Neck: Supple, no JVD, bilateral  carotid bruits with CEA , or masses noted.  Cardiac: Irregular irregular rhythm. Rate is bradycardic. No murmurs, rubs, or gallops. No edema.  Respiratory:  Lungs are clear to auscultation bilaterally with normal work of breathing.  GI: Soft and nontender.  Aorta palpable with bruit Bilateral femoral  bruits but popliteals plus 3  MS: No deformity or atrophy. Gait and ROM intact. Skin: Warm and dry. Color is normal.  Neuro:  Strength and sensation are intact and no gross focal deficits noted.  Psych: Alert, appropriate and with normal affect.   LABORATORY DATA:  EKG:  EKG 2014   atrial fibrillation with slow VR - HR is 54.  12/21/14  SR ate 89  PVC  LPFB inferior T wave changes LVH  Lab Results  Component  Value Date   WBC 6.2 11/13/2014   HGB 11.9* 11/13/2014   HCT 35.7* 11/13/2014   PLT 174.0 11/13/2014   GLUCOSE 99 11/13/2014   CHOL 112 06/09/2013   TRIG 69.0 06/09/2013   HDL 43.20 06/09/2013   LDLCALC 55 06/09/2013   ALT 22 06/09/2013   AST 25 06/09/2013   NA 139 11/13/2014   K 5.0 11/13/2014   CL 109 11/13/2014   CREATININE 1.80* 11/13/2014   BUN 35* 11/13/2014   CO2 23 11/13/2014   TSH 2.30 10/19/2014   INR 1.2* 11/13/2014   HGBA1C 6.3* 06/15/2011    BNP (last 3 results) No results for input(s): BNP in the last 8760 hours.  ProBNP (last 3 results) No results for input(s): PROBNP in the last 8760 hours.   Other Studies Reviewed Today:   Assessment/Plan: 1. Afib:  Initially 2014  Recurrent with successful [facility] November 15 2014  Continue eliquis  Given age and renal function he is on lower dose Continue beta blocker   2. HTN - BP fair - better control. Hydralazine stopped and on lisinopril and coreg   3. PVD  Medial calcinosis ABI's over 1 in 07/2014  TBI in .5 range slightly abnormal On exam he has abdominal and bilateral femoral bruits Will order Abdominal US and LE arterial duplex Suspect he will need PV referral  4. CAD - CABG 1999 grafts patent cath 2011  myovue scar no ischemia Given age CRF and lack of ischemia no chest pain no indication for cath at this time   5. PreSyncope:  Not postural in office. Doubt low BP.  Post bilateral CEA duplex with no stenosis   6. Avoid diuretics for BP control Avoid NSAI's  Have adjusted dose of NOAC   His primary at  [facility] Dr [place]ique has left. He would like to be referred to [facility] practice personally called office manager to help arrange New patient visit with Dr Raoul Pitch or Ernestine Conrad    F/u with me in 6 months   Jenkins Rouge

## 2015-10-10 ENCOUNTER — Ambulatory Visit (INDEPENDENT_AMBULATORY_CARE_PROVIDER_SITE_OTHER): Payer: Medicare Other | Admitting: Cardiovascular Disease

## 2015-10-10 ENCOUNTER — Encounter: Payer: Self-pay | Admitting: Cardiovascular Disease

## 2015-10-10 VITALS — BP 140/54 | HR 70 | Ht 68.0 in | Wt 156.0 lb

## 2015-10-10 DIAGNOSIS — I2510 Atherosclerotic heart disease of native coronary artery without angina pectoris: Secondary | ICD-10-CM

## 2015-10-10 DIAGNOSIS — I2583 Coronary atherosclerosis due to lipid rich plaque: Principal | ICD-10-CM

## 2015-10-10 DIAGNOSIS — I6523 Occlusion and stenosis of bilateral carotid arteries: Secondary | ICD-10-CM

## 2015-10-10 NOTE — Patient Instructions (Signed)

## 2015-11-15 DIAGNOSIS — C44319 Basal cell carcinoma of skin of other parts of face: Secondary | ICD-10-CM | POA: Diagnosis not present

## 2015-11-15 DIAGNOSIS — L812 Freckles: Secondary | ICD-10-CM | POA: Diagnosis not present

## 2015-11-15 DIAGNOSIS — L821 Other seborrheic keratosis: Secondary | ICD-10-CM | POA: Diagnosis not present

## 2015-11-15 DIAGNOSIS — Z85828 Personal history of other malignant neoplasm of skin: Secondary | ICD-10-CM | POA: Diagnosis not present

## 2015-11-15 DIAGNOSIS — D1801 Hemangioma of skin and subcutaneous tissue: Secondary | ICD-10-CM | POA: Diagnosis not present

## 2015-11-15 DIAGNOSIS — L570 Actinic keratosis: Secondary | ICD-10-CM | POA: Diagnosis not present

## 2015-11-15 DIAGNOSIS — D485 Neoplasm of uncertain behavior of skin: Secondary | ICD-10-CM | POA: Diagnosis not present

## 2015-12-03 DIAGNOSIS — Z85828 Personal history of other malignant neoplasm of skin: Secondary | ICD-10-CM | POA: Diagnosis not present

## 2015-12-03 DIAGNOSIS — C44219 Basal cell carcinoma of skin of left ear and external auricular canal: Secondary | ICD-10-CM | POA: Diagnosis not present

## 2015-12-18 ENCOUNTER — Ambulatory Visit (INDEPENDENT_AMBULATORY_CARE_PROVIDER_SITE_OTHER): Payer: Medicare Other | Admitting: Family Medicine

## 2015-12-18 ENCOUNTER — Encounter: Payer: Self-pay | Admitting: Family Medicine

## 2015-12-18 VITALS — BP 150/67 | HR 89 | Temp 97.8°F | Resp 20 | Ht 68.0 in | Wt 153.2 lb

## 2015-12-18 DIAGNOSIS — R42 Dizziness and giddiness: Secondary | ICD-10-CM

## 2015-12-18 DIAGNOSIS — Z7189 Other specified counseling: Secondary | ICD-10-CM | POA: Diagnosis not present

## 2015-12-18 DIAGNOSIS — Z7689 Persons encountering health services in other specified circumstances: Secondary | ICD-10-CM

## 2015-12-18 DIAGNOSIS — H3530 Unspecified macular degeneration: Secondary | ICD-10-CM

## 2015-12-18 DIAGNOSIS — R7309 Other abnormal glucose: Secondary | ICD-10-CM

## 2015-12-18 DIAGNOSIS — N183 Chronic kidney disease, stage 3 unspecified: Secondary | ICD-10-CM

## 2015-12-18 DIAGNOSIS — I480 Paroxysmal atrial fibrillation: Secondary | ICD-10-CM

## 2015-12-18 DIAGNOSIS — I6523 Occlusion and stenosis of bilateral carotid arteries: Secondary | ICD-10-CM

## 2015-12-18 DIAGNOSIS — E559 Vitamin D deficiency, unspecified: Secondary | ICD-10-CM | POA: Diagnosis not present

## 2015-12-18 DIAGNOSIS — I10 Essential (primary) hypertension: Secondary | ICD-10-CM

## 2015-12-18 LAB — CBC WITH DIFFERENTIAL/PLATELET
Basophils Absolute: 0 10*3/uL (ref 0.0–0.1)
Basophils Relative: 0.3 % (ref 0.0–3.0)
EOS PCT: 2 % (ref 0.0–5.0)
Eosinophils Absolute: 0.2 10*3/uL (ref 0.0–0.7)
HCT: 30 % — ABNORMAL LOW (ref 39.0–52.0)
Hemoglobin: 10 g/dL — ABNORMAL LOW (ref 13.0–17.0)
LYMPHS ABS: 2.3 10*3/uL (ref 0.7–4.0)
Lymphocytes Relative: 26.7 % (ref 12.0–46.0)
MCHC: 33.3 g/dL (ref 30.0–36.0)
MCV: 91.3 fl (ref 78.0–100.0)
MONOS PCT: 8.8 % (ref 3.0–12.0)
Monocytes Absolute: 0.8 10*3/uL (ref 0.1–1.0)
NEUTROS ABS: 5.4 10*3/uL (ref 1.4–7.7)
NEUTROS PCT: 62.2 % (ref 43.0–77.0)
Platelets: 264 10*3/uL (ref 150.0–400.0)
RBC: 3.29 Mil/uL — ABNORMAL LOW (ref 4.22–5.81)
RDW: 16.2 % — AB (ref 11.5–15.5)
WBC: 8.7 10*3/uL (ref 4.0–10.5)

## 2015-12-18 LAB — COMPREHENSIVE METABOLIC PANEL
ALBUMIN: 3.7 g/dL (ref 3.5–5.2)
ALT: 18 U/L (ref 0–53)
AST: 18 U/L (ref 0–37)
Alkaline Phosphatase: 60 U/L (ref 39–117)
BUN: 41 mg/dL — AB (ref 6–23)
CHLORIDE: 112 meq/L (ref 96–112)
CO2: 20 meq/L (ref 19–32)
CREATININE: 2.02 mg/dL — AB (ref 0.40–1.50)
Calcium: 8.3 mg/dL — ABNORMAL LOW (ref 8.4–10.5)
GFR: 33.5 mL/min — ABNORMAL LOW (ref >60.00)
Glucose, Bld: 127 mg/dL — ABNORMAL HIGH (ref 70–99)
Potassium: 4.7 mEq/L (ref 3.5–5.1)
SODIUM: 139 meq/L (ref 135–145)
Total Bilirubin: 0.4 mg/dL (ref 0.2–1.2)
Total Protein: 5.9 g/dL — ABNORMAL LOW (ref 6.0–8.3)

## 2015-12-18 LAB — HEMOGLOBIN A1C: Hgb A1c MFr Bld: 5.7 % (ref 4.6–6.5)

## 2015-12-18 LAB — VITAMIN D 25 HYDROXY (VIT D DEFICIENCY, FRACTURES): VITD: 32.34 ng/mL (ref 30.00–100.00)

## 2015-12-18 NOTE — Patient Instructions (Signed)
I will call you with your lab results.  Make sure to stay well hydrated.  We will discuss your plan when we call you about your labs.   Orthostatic Hypotension Orthostatic hypotension is a sudden drop in blood pressure. It happens when you quickly stand up from a seated or lying position. You may feel dizzy or light-headed. This can last for just a few seconds or for up to a few minutes. It is usually not a serious problem. However, if this happens frequently or gets worse, it can be a sign of something more serious. CAUSES  Different things can cause orthostatic hypotension, including:   Loss of body fluids (dehydration).  Medicines that lower blood pressure.  Sudden changes in posture, such as standing up quickly after you have been sitting or lying down.  Taking too much of your medicine. SIGNS AND SYMPTOMS   Light-headedness or dizziness.   Fainting or near-fainting.   A fast heart rate.   Weakness.   Feeling tired (fatigue).  DIAGNOSIS  Your health care provider may do several things to help diagnose your condition and identify the cause. These may include:   Taking a medical history and doing a physical exam.  Checking your blood pressure. Your health care provider will check your blood pressure when you are:  Lying down.  Sitting.  Standing.  Using tilt table testing. In this test, you lie down on a table that moves from a lying position to a standing position. You will be strapped onto the table. This test monitors your blood pressure and heart rate when you are in different positions. TREATMENT  Treatment will vary depending on the cause. Possible treatments include:   Changing the dosage of your medicines.  Wearing compression stockings on your lower legs.  Standing up slowly after sitting or lying down.  Eating more salt.  Eating frequent, small meals.  In some cases, getting IV fluids.  Taking medicine to enhance fluid retention. HOME CARE  INSTRUCTIONS  Only take over-the-counter or prescription medicines as directed by your health care provider.  Follow your health care provider's instructions for changing the dosage of your current medicines.  Do not stop or adjust your medicine on your own.  Stand up slowly after sitting or lying down. This allows your body to adjust to the different position.  Wear compression stockings as directed.  Eat extra salt as directed.  Do not add extra salt to your diet unless directed to by your health care provider.  Eat frequent, small meals.  Avoid standing suddenly after eating.  Avoid hot showers or excessive heat as directed by your health care provider.  Keep all follow-up appointments. SEEK MEDICAL CARE IF:  You continue to feel dizzy or light-headed after standing.  You feel groggy or confused.  You feel cold, clammy, or sick to your stomach (nauseous).  You have blurred vision.  You feel short of breath. SEEK IMMEDIATE MEDICAL CARE IF:   You faint after standing.  You have chest pain.  You have difficulty breathing.   You lose feeling or movement in your arms or legs.   You have slurred speech or difficulty talking, or you are unable to talk.  MAKE SURE YOU:   Understand these instructions.  Will watch your condition.  Will get help right away if you are not doing well or get worse.   This information is not intended to replace advice given to you by your health care provider. Make sure you discuss  any questions you have with your health care provider.   Document Released: 04/04/2002 Document Revised: 04/19/2013 Document Reviewed: 02/04/2013 Elsevier Interactive Patient Education [employer].

## 2015-12-18 NOTE — Progress Notes (Signed)
Patient ID: [name], male  DOB: Nov 30, 1928, 82 y.o.   MRN: [id number] Patient Care Team    Relationship Specialty Notifications Start End  [name], DO PCP - General Family Medicine  12/18/15   [name], MD Consulting Physician Ophthalmology  12/18/15   [name], MD Consulting Physician Dermatology  12/18/15   [name], MD Consulting Physician Cardiology  12/18/15     Subjective:  [name] is a 82 y.o.  male present for new patient establishment. All past medical history, surgical history, allergies, family history, immunizations, medications and social history were obtained and entered in the electronic medical record today. All recent labs, ED visits and hospitalizations within the last year were reviewed. Transfer from [facility].  Dizziness/Hypertension: He states he has blood pressure problems. He feels that his blood pressure drops after he is active. He reports he gets dizzy if he stands up too quickly at times. When this occurs he feels that his vision gets very "bright" for about 15-20 minutes. He denies syncopal episodes, chest pain or shortness of breath during these events. He also endorses feeling the same sensation after eating meals on occasions. He reports the lowest blood pressure he has seen at home has been 109/40, this occurred after he was playing golf and he was symptomatic. He denies any blood loss/rectal bleeding. He states he likely does not drink enough water. He states he did have meals prior to these events. He states these have been going on for a couple months. He was seen by cardiology and may with stress test, echocardiogram and Doppler studies. He states he does feel that his blood pressure is lower now than it was when he was being evaluated in May. He denies any medication changes. He takes a daily aspirin, eliquis 2.5 mg, Coreg 6.25 twice a day, lisinopril 20 mg. He also is prescribed Flomax, but doesn't recall  having difficulties with urination. He has a history of abdominal and bilateral femoral bruits. F/U myovue 08/28/15 scar no ischemia  Nuclear stress EF: 70%.  There was no ST segment deviation noted during stress.  Defect 1: There is a medium defect of moderate severity present in the basal inferolateral, mid inferolateral and apical lateral location.  Findings consistent with prior myocardial infarction with a small amount of peri-infarct ischemia.  This is a low risk study.  The left ventricular ejection fraction is hyperdynamic (>65%).  Duplex 09/03/15  1-39% ICA normal vertebral flow  Normal caliber abdominal aorta. Aorto-iliac atherosclerosis. >50% bilateral common iliac artery disease, without focal stenosis.   Diabetes: Patient has history of type 2 diabetes, he's been diet controlled for many years. He exercises daily, involved in Silver sneakers. He watches his diet closely. He checks his blood glucose daily fasting before breakfast and states his blood sugars are usually between 95 and 120. His last eye exam 04/13/2015. Advised that exam 07/09/2015. He denies polydipsia, polyuria, polyphagia, abdominal pain, nonhealing wounds or numbness or tingling in his extremities. His last A1c was 5.9 07/09/2015. He has had an abnormal microalbumin on the same date with a microalbumin creatinine ratio of 43.9 and a microalbumin. Microalbumin 3.8 this was reportedly an improvement from his prior readings in September 2016. Patient is on an ACE inhibitor.  Health maintenance:  Colonoscopy: > 75, N/A Immunizations: Influenza 2016 (encouraged yearly). Other immunization history unknown, awaiting records. Infectious disease screening: N/A PSA: No results found for: PSA. > 65, N/A. Assistive  device: None Oxygen MCN:OBSJ Patient has a Dental home. Hospitalizations/ED visits: None  Immunization History  Administered Date(s) Administered  . Influenza-Unspecified 01/08/2015    Past Medical  History:  Diagnosis Date  . Bigeminal rhythm   . Cancer ([place])    skin  . Cataract   . Coronary artery disease    post bypass  . CVD (cardiovascular disease)   . Diabetes mellitus without complication ([place])   . Diverticular disease   . Easy bruising   . Hernia   . Hyperlipidemia   . Hypertension   . PVD (peripheral vascular disease) (HCC)    Allergies  Allergen Reactions  . Penicillins Hives   Past Surgical History:  Procedure Laterality Date  . CARDIAC CATHETERIZATION  2006  . CARDIOVERSION N/A 02/22/2013   Procedure: CARDIOVERSION;  Surgeon: [name], MD;  Location: [facility] ENDOSCOPY;  Service: Cardiovascular;  Laterality: N/A;  . CARDIOVERSION N/A 11/15/2014   Procedure: CARDIOVERSION;  Surgeon: [name], MD;  Location: [place];  Service: Cardiovascular;  Laterality: N/A;  . CAROTID ENDARTERECTOMY  2009/ 1993   left/ right  . CORONARY ARTERY BYPASS GRAFT  1999  . EYE SURGERY     eyelid repair  . HERNIA REPAIR  02/11/12   Bilateral inguinal hernia repair  . INGUINAL HERNIA REPAIR  02/11/2012   Procedure: LAPAROSCOPIC BILATERAL INGUINAL HERNIA REPAIR;  Surgeon: Pedro Earls, MD;  Location: [facility] ORS;  Service: General;  Laterality: Bilateral;  . SKIN CANCER EXCISION     right ear x 3   Family History  Problem Relation Age of Onset  . Stroke Mother   . Coronary artery disease Father   . Skin cancer Sister    Social History   Social History  . Marital status: Married    Spouse name: N/A  . Number of children: N/A  . Years of education: N/A   Occupational History  . Not on file.   Social History Main Topics  . Smoking status: Former Smoker    Quit date: 12/25/1961  . Smokeless tobacco: Former Systems developer    Types: Chew    Quit date: 02/03/1969  . Alcohol use No  . Drug use: No  . Sexual activity: No   Other Topics Concern  . Not on file   Social History Narrative  . No narrative on file     Medication List       Accurate as of 12/18/15 11:59  PM. Always use your most recent med list.          allopurinol 300 MG tablet Commonly known as:  ZYLOPRIM Take 150 mg by mouth daily.   Aloe Vera 25 MG Caps Take 2 capsules by mouth daily.   aspirin 81 MG tablet Take 81 mg by mouth at bedtime.   atorvastatin 20 MG tablet Commonly known as:  LIPITOR Take 20 mg by mouth at bedtime.   carvedilol 6.25 MG tablet Commonly known as:  COREG Take 1 tablet (6.25 mg total) by mouth 2 (two) times daily.   cholecalciferol 1000 units tablet Commonly known as:  VITAMIN D Take 5,000 Units by mouth daily.   CHROMIUM GTF PO Take 2 capsules by mouth daily.   Cinnamon 500 MG Tabs Take 2 tablets by mouth at bedtime.   Co Q 10 100 MG Caps Take 1 capsule by mouth daily.   colchicine 0.6 MG tablet Take 0.6 mg by mouth daily as needed.   ELIQUIS 2.5 MG Tabs tablet Generic drug:  apixaban TAKE 1 TABLET TWICE A DAY   fish oil-omega-3 fatty acids 1000 MG capsule Take 1 g by mouth daily.   lisinopril 20 MG tablet Commonly known as:  PRINIVIL,ZESTRIL Take 1 tablet (20 mg total) by mouth daily.   nitroGLYCERIN 0.4 MG SL tablet Commonly known as:  NITROSTAT Place 0.4 mg under the tongue every 5 (five) minutes as needed (up to 3 doses only). For chest pain   OVER THE COUNTER MEDICATION Take 2 tablets by mouth every evening. GlucoCare   PRESERVISION AREDS PO Take 1 tablet by mouth 2 (two) times daily.   PROBIOTIC PO Take 1 tablet by mouth daily.   tamsulosin 0.4 MG Caps capsule Commonly known as:  FLOMAX Take 0.4 mg by mouth daily after supper.   VANADYL SULFATE PO Take 1 capsule by mouth daily.   zinc gluconate 50 MG tablet Take 50 mg by mouth daily.        Recent Results (from the past 2160 hour(s))  Comp Met (CMET)     Status: Abnormal   Collection Time: 12/18/15  2:59 PM  Result Value Ref Range   Sodium 139 135 - 145 mEq/L   Potassium 4.7 3.5 - 5.1 mEq/L   Chloride 112 96 - 112 mEq/L   CO2 20 19 - 32 mEq/L    Glucose, Bld 127 (H) 70 - 99 mg/dL   BUN 41 (H) 6 - 23 mg/dL   Creatinine, Ser 2.02 (H) 0.40 - 1.50 mg/dL   Total Bilirubin 0.4 0.2 - 1.2 mg/dL   Alkaline Phosphatase 60 39 - 117 U/L   AST 18 0 - 37 U/L   ALT 18 0 - 53 U/L   Total Protein 5.9 (L) 6.0 - 8.3 g/dL   Albumin 3.7 3.5 - 5.2 g/dL   Calcium 8.3 (L) 8.4 - 10.5 mg/dL   GFR 33.50 (L) >60.00 mL/min  HgB A1c     Status: None   Collection Time: 12/18/15  2:59 PM  Result Value Ref Range   Hgb A1c MFr Bld 5.7 4.6 - 6.5 %    Comment: Glycemic Control Guidelines for People with Diabetes:Non Diabetic:  <6%Goal of Therapy: <7%Additional Action Suggested:  >8%   Vitamin D (25 hydroxy)     Status: None   Collection Time: 12/18/15  2:59 PM  Result Value Ref Range   VITD 32.34 30.00 - 100.00 ng/mL  CBC w/Diff     Status: Abnormal   Collection Time: 12/18/15  2:59 PM  Result Value Ref Range   WBC 8.7 4.0 - 10.5 K/uL   RBC 3.29 (L) 4.22 - 5.81 Mil/uL   Hemoglobin 10.0 (L) 13.0 - 17.0 g/dL   HCT 30.0 (L) 39.0 - 52.0 %   MCV 91.3 78.0 - 100.0 fl   MCHC 33.3 30.0 - 36.0 g/dL   RDW 16.2 (H) 11.5 - 15.5 %   Platelets 264.0 150.0 - 400.0 K/uL   Neutrophils Relative % 62.2 43.0 - 77.0 %   Lymphocytes Relative 26.7 12.0 - 46.0 %   Monocytes Relative 8.8 3.0 - 12.0 %   Eosinophils Relative 2.0 0.0 - 5.0 %   Basophils Relative 0.3 0.0 - 3.0 %   Neutro Abs 5.4 1.4 - 7.7 K/uL   Lymphs Abs 2.3 0.7 - 4.0 K/uL   Monocytes Absolute 0.8 0.1 - 1.0 K/uL   Eosinophils Absolute 0.2 0.0 - 0.7 K/uL   Basophils Absolute 0.0 0.0 - 0.1 K/uL    No results found.   ROS:  14 pt review of systems performed and negative (unless mentioned in an HPI)  Objective: BP (!) 150/67 (BP Location: Left Arm, Patient Position: Standing, Cuff Size: Normal)   Pulse 89   Temp 97.8 F (36.6 C)   Resp 20   Ht '5\' 8"'  (1.727 m)   Wt 153 lb 4 oz (69.5 kg)   SpO2 98%   BMI 23.30 kg/m   Orthostatics negative Gen: Afebrile. No acute distress. Nontoxic in appearance,  well-developed, well-nourished,  pleasant Caucasian male. HENT: AT. [place]. MMM Eyes:Pupils Equal Round Reactive to light, Extraocular movements intact,  Conjunctiva without redness, discharge or icterus. Neck/lymp/endocrine: Supple, no lymphadenopathy, no thyromegaly CV: RRR, no edema, right carotid bruits. No JVD. Chest: CTAB, no wheeze, rhonchi or crackles. Normal Respiratory effort. Good Air movement. Abd: Soft. Flat. NTND. BS present. No Masses palpated. No hepatosplenomegaly. Skin: No rashes, purpura or petechiae. Warm and well-perfused. Skin intact. Neuro/Msk:  Normal gait. PERLA. EOMi. Alert. Oriented x3.   Psych: Normal affect, dress and demeanor. Normal speech. Normal thought content and judgment.  Assessment/plan: BRYLEN WAGAR is a 82 y.o. male present for establishment of care with concerns of orthostatic hypotension and postprandial hypotension. Dizziness - Patient reports ports sounds orthostatic/postprandial. Encouraged increasing water consumption/hydration especially when being active. CBC and CMP collected today. - Thorough cardiac evaluation completed in May 2017 reassuring - May consider discontinuing Flomax, patient does not recall having urinary problems and does not have anything suggesting urinary problems/BPH on medical history. - Orthostatics negative in office today. - Comp Met (CMET) - CBC w/Diff  DIABETES MELLITUS, BORDERLINE - Appears to be diet controlled for quite some time - Unknown immunizations, records requested - Elevated microalbumin, despite ACE inhibitor use. - eye examined up-to-date December 2016 - Foot exam up-to-date March 2017 - HgB A1c  Vitamin D deficiency - History of vitamin D deficiency with development, follow-up vitamin D was not obtained. - Vitamin D (25 hydroxy)  Paroxysmal atrial fibrillation (HCC)/essential hypertension - Patient is on daily baby aspirin and renally dosed Eliquis 2.5 mg twice a day. - Lisinopril 20 mg daily,  Coreg 6.25 mg twice a day continued - Follows with cardiology  Macular degeneration - Continue regular follow-ups with ophthalmology and retinal specialist.  Kidney disease, chronic, stage III (moderate, EGFR 30-59 ml/min) - No NSAIDs - Risk factor control: Hypertension and diabetes - Positive microalbumin - Renally dose medications: Allopurinol decreased to 150 mg daily. Colchicine will need to be discontinued if patient is still taking. Eliquis 2.5 mg BID. -  CMP, CBC   Follow-up: Likely will need close follow-up after results of labs on acute issue of dizziness/hypotension Routinely scheduled 6 month follow-ups on chronic medical conditions  Yearly Medicare wellness  Greater than 45 minutes was spent with patient, greater than 50% of that time was spent face-to-face with patient counseling and coordinating care.  Electronically signed by: Howard Pouch, DO [place]

## 2015-12-19 ENCOUNTER — Encounter: Payer: Self-pay | Admitting: Family Medicine

## 2015-12-19 ENCOUNTER — Telehealth: Payer: Self-pay | Admitting: Family Medicine

## 2015-12-19 DIAGNOSIS — N183 Chronic kidney disease, stage 3 unspecified: Secondary | ICD-10-CM | POA: Insufficient documentation

## 2015-12-19 DIAGNOSIS — M109 Gout, unspecified: Secondary | ICD-10-CM | POA: Insufficient documentation

## 2015-12-19 DIAGNOSIS — H3530 Unspecified macular degeneration: Secondary | ICD-10-CM | POA: Insufficient documentation

## 2015-12-19 DIAGNOSIS — N184 Chronic kidney disease, stage 4 (severe): Secondary | ICD-10-CM | POA: Insufficient documentation

## 2015-12-19 DIAGNOSIS — N189 Chronic kidney disease, unspecified: Secondary | ICD-10-CM

## 2015-12-19 NOTE — Telephone Encounter (Signed)
Please call pt: - his lab has returned and he shows he has some anemia (low blood count) and his kidney function is mildly worse than last check. There are many reasons for lower than normal blood count, and we will need to investigate further.  - I would like him to schedule an appt to discuss results and will need to have further lab drawn.

## 2015-12-20 NOTE — Telephone Encounter (Signed)
Spoke with patient reviewed lab information and scheduled patient appt.

## 2015-12-23 ENCOUNTER — Encounter: Payer: Self-pay | Admitting: Cardiovascular Disease

## 2015-12-25 ENCOUNTER — Ambulatory Visit: Payer: Medicare Other | Admitting: Cardiovascular Disease

## 2015-12-26 ENCOUNTER — Other Ambulatory Visit: Payer: Self-pay | Admitting: Family Medicine

## 2015-12-26 ENCOUNTER — Ambulatory Visit (INDEPENDENT_AMBULATORY_CARE_PROVIDER_SITE_OTHER): Payer: Medicare Other | Admitting: Family Medicine

## 2015-12-26 ENCOUNTER — Encounter: Payer: Self-pay | Admitting: Family Medicine

## 2015-12-26 VITALS — BP 135/64 | HR 58 | Temp 97.3°F | Resp 20 | Ht 68.0 in | Wt 152.0 lb

## 2015-12-26 DIAGNOSIS — I6523 Occlusion and stenosis of bilateral carotid arteries: Secondary | ICD-10-CM

## 2015-12-26 DIAGNOSIS — E559 Vitamin D deficiency, unspecified: Secondary | ICD-10-CM | POA: Diagnosis not present

## 2015-12-26 DIAGNOSIS — N184 Chronic kidney disease, stage 4 (severe): Secondary | ICD-10-CM | POA: Diagnosis not present

## 2015-12-26 DIAGNOSIS — N183 Chronic kidney disease, stage 3 (moderate): Secondary | ICD-10-CM | POA: Diagnosis not present

## 2015-12-26 DIAGNOSIS — R809 Proteinuria, unspecified: Secondary | ICD-10-CM | POA: Diagnosis not present

## 2015-12-26 DIAGNOSIS — D649 Anemia, unspecified: Secondary | ICD-10-CM

## 2015-12-26 DIAGNOSIS — R531 Weakness: Secondary | ICD-10-CM | POA: Diagnosis not present

## 2015-12-26 DIAGNOSIS — R42 Dizziness and giddiness: Secondary | ICD-10-CM

## 2015-12-26 LAB — BASIC METABOLIC PANEL
BUN: 35 mg/dL — AB (ref 7–25)
CHLORIDE: 114 mmol/L — AB (ref 98–110)
CO2: 19 mmol/L — ABNORMAL LOW (ref 20–31)
CREATININE: 1.8 mg/dL — AB (ref 0.70–1.11)
Calcium: 8.9 mg/dL (ref 8.6–10.3)
Glucose, Bld: 88 mg/dL (ref 65–99)
POTASSIUM: 5.1 mmol/L (ref 3.5–5.3)
Sodium: 141 mmol/L (ref 135–146)

## 2015-12-26 LAB — IRON AND TIBC
%SAT: 18 % (ref 15–60)
IRON: 47 ug/dL — AB (ref 50–180)
TIBC: 256 ug/dL (ref 250–425)
UIBC: 209 ug/dL (ref 125–400)

## 2015-12-26 LAB — FERRITIN: Ferritin: 93 ng/mL (ref 20–380)

## 2015-12-26 LAB — TSH: TSH: 1.85 m[IU]/L (ref 0.40–4.50)

## 2015-12-26 LAB — MAGNESIUM: Magnesium: 1.6 mg/dL (ref 1.5–2.5)

## 2015-12-26 LAB — PHOSPHORUS: Phosphorus: 3.3 mg/dL (ref 2.1–4.3)

## 2015-12-26 NOTE — Patient Instructions (Signed)
We are collecting labs today to look for cause of her anemia and protein in your urine.  We will call you with all the results once available.    24-Hour Urine Collection HOW DO I DO A 24-HOUR URINE COLLECTION?  When you get up in the morning, urinate in the toilet and flush. Write down the time. This will be your start time on the day of collection and your end time on the next morning.  From then on, collect all of your urine in the plastic jug that is given to you.  Stop collecting your urine 24 hours after you started.  If the plastic jug that is given to you already has liquid in it, that is okay. Do not throw out the liquid or rinse out the jug. Some tests need the liquid to be added to your urine.  Keep your plastic jug cool in an ice chest or keep it in the refrigerator during the test.  When 24 hours are over, bring your plastic jug to the clinic lab. Keep the jug cool in an ice chest while you are bringing it to the lab.   This information is not intended to replace advice given to you by your health care provider. Make sure you discuss any questions you have with your health care provider.   Document Released: 07/11/2008 Document Revised: 05/05/2014 Document Reviewed: 09/07/2013 Elsevier Interactive Patient Education [employer].

## 2015-12-26 NOTE — Progress Notes (Signed)
Patient ID: [name], male  DOB: Apr 20, 1929, 82 y.o.   MRN: [id number] Patient Care Team    Relationship Specialty Notifications Start End  [name], DO PCP - General Family Medicine  12/18/15   [name], MD Consulting Physician Ophthalmology  12/18/15   [name], MD Consulting Physician Dermatology  12/18/15   [name], MD Consulting Physician Cardiology  12/18/15   [name], MD Consulting Physician Ophthalmology  12/19/15   Laurence Spates, MD Consulting Physician Gastroenterology  12/19/15     Subjective:  [name] is a 82 y.o.  male present for follow up Pt presents for follow up on dizziness and discussion on labs last week. Pt was found to have significant mircoalbuminuria, anemia (both Hgb/HCT), mildly low serum protein,  and worsening kidney fx on labs in comparison to prior labs from prior PCP. CrCl is ~29, therefore progressing and now technically stage 4. He has complaints of fatigue, weight loss, presyncopal episodes, dizziness and low back pain. Pt denies any increase in bleeding. He has been attempting to maintain better hydration since last appt.  His last A1c was 5.9 07/09/2015. He has had an abnormal microalbumin on the same date with a microalbumin creatinine ratio of 43.9 and a microalbumin. Microalbumin 3.8 this was reportedly an improvement from his prior readings in September 2016. Patient is on an ACE inhibitor. His BP is well controlled.   Prior note: Dizziness/Hypertension: He states he has blood pressure problems. He feels that his blood pressure drops after he is active. He reports he gets dizzy if he stands up too quickly at times. When this occurs he feels that his vision gets very "bright" for about 15-20 minutes. He denies syncopal episodes, chest pain or shortness of breath during these events. He also endorses feeling the same sensation after eating meals on occasions. He reports the lowest blood pressure he has seen at  home has been 109/40, this occurred after he was playing golf and he was symptomatic. He denies any blood loss/rectal bleeding. He states he likely does not drink enough water. He states he did have meals prior to these events. He states these have been going on for a couple months. He was seen by cardiology and may with stress test, echocardiogram and Doppler studies. He states he does feel that his blood pressure is lower now than it was when he was being evaluated in May. He denies any medication changes. He takes a daily aspirin, eliquis 2.5 mg, Coreg 6.25 twice a day, lisinopril 20 mg. He also is prescribed Flomax, but doesn't recall having difficulties with urination. He has a history of abdominal and bilateral femoral bruits. F/U myovue 08/28/15 scar no ischemia  Nuclear stress EF: 70%.  There was no ST segment deviation noted during stress.  Defect 1: There is a medium defect of moderate severity present in the basal inferolateral, mid inferolateral and apical lateral location.  Findings consistent with prior myocardial infarction with a small amount of peri-infarct ischemia.  This is a low risk study.  The left ventricular ejection fraction is hyperdynamic (>65%).  Duplex 09/03/15  1-39% ICA normal vertebral flow  Normal caliber abdominal aorta. Aorto-iliac atherosclerosis. >50% bilateral common iliac artery disease, without focal stenosis.    Immunization History  Administered Date(s) Administered  . Influenza-Unspecified 01/08/2015    Past Medical History:  Diagnosis Date  . Basal cell carcinoma    skin  . Bigeminal rhythm   .  Cataract   . Coronary artery disease    post bypass  . CVD (cardiovascular disease)   . Diabetes mellitus without complication ([place])   . Diverticular disease   . Easy bruising   . Gout   . Hernia   . Hyperlipidemia   . Hypertension   . Macular degeneration   . Microscopic colitis   . PVD (peripheral vascular disease) ([facility])    Allergies    Allergen Reactions  . Penicillins Hives   Past Surgical History:  Procedure Laterality Date  . CARDIAC CATHETERIZATION  2006  . CARDIOVERSION N/A 02/22/2013   Procedure: CARDIOVERSION;  Surgeon: [name], MD;  Location: [facility] ENDOSCOPY;  Service: Cardiovascular;  Laterality: N/A;  . CARDIOVERSION N/A 11/15/2014   Procedure: CARDIOVERSION;  Surgeon: [name], MD;  Location: [place];  Service: Cardiovascular;  Laterality: N/A;  . CAROTID ENDARTERECTOMY  2009/ 1993   left/ right  . CORONARY ARTERY BYPASS GRAFT  1999  . EYE SURGERY     eyelid repair  . INGUINAL HERNIA REPAIR  02/11/2012   Procedure: LAPAROSCOPIC BILATERAL INGUINAL HERNIA REPAIR;  Surgeon: Pedro Earls, MD;  Location: [facility] ORS;  Service: General;  Laterality: Bilateral;  . SKIN CANCER EXCISION     right ear x 3   Family History  Problem Relation Age of Onset  . Stroke Mother   . Coronary artery disease Father   . Heart disease Father   . Melanoma Sister    Social History   Social History  . Marital status: Married    Spouse name: N/A  . Number of children: N/A  . Years of education: N/A   Occupational History  . retired    Social History Main Topics  . Smoking status: Former Smoker    Quit date: 12/25/1961  . Smokeless tobacco: Former Systems developer    Types: Chew    Quit date: 02/03/1969  . Alcohol use No  . Drug use: No  . Sexual activity: No   Other Topics Concern  . 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   Some college. Retired from [employer].   Drinks caffeine, uses herbal remedies, takes a daily vitamin.   Wears his seatbelt, smoke detector at home, firearms in the home.   Wears a hearing aid.   Feels safe in her relationships.     Medication List       Accurate as of 12/26/15  2:51 PM. Always use your most recent med list.          allopurinol 300 MG tablet Commonly known as:  ZYLOPRIM Take 150 mg by mouth daily.    Aloe Vera 25 MG Caps Take 2 capsules by mouth daily.   aspirin 81 MG tablet Take 81 mg by mouth at bedtime.   atorvastatin 20 MG tablet Commonly known as:  LIPITOR Take 20 mg by mouth at bedtime.   carvedilol 6.25 MG tablet Commonly known as:  COREG Take 1 tablet (6.25 mg total) by mouth 2 (two) times daily.   cholecalciferol 1000 units tablet Commonly known as:  VITAMIN D Take 5,000 Units by mouth daily.   CHROMIUM GTF PO Take 2 capsules by mouth daily.   Cinnamon 500 MG Tabs Take 2 tablets by mouth at bedtime.   Co Q 10 100 MG Caps Take 1 capsule by mouth daily.   colchicine 0.6 MG tablet Take 0.6 mg by mouth daily as needed.  ELIQUIS 2.5 MG Tabs tablet Generic drug:  apixaban TAKE 1 TABLET TWICE A DAY   fish oil-omega-3 fatty acids 1000 MG capsule Take 1 g by mouth daily.   lisinopril 20 MG tablet Commonly known as:  PRINIVIL,ZESTRIL Take 1 tablet (20 mg total) by mouth daily.   nitroGLYCERIN 0.4 MG SL tablet Commonly known as:  NITROSTAT Place 0.4 mg under the tongue every 5 (five) minutes as needed (up to 3 doses only). For chest pain   OVER THE COUNTER MEDICATION Take 2 tablets by mouth every evening. GlucoCare   PRESERVISION AREDS PO Take 1 tablet by mouth 2 (two) times daily.   PROBIOTIC PO Take 1 tablet by mouth daily.   tamsulosin 0.4 MG Caps capsule Commonly known as:  FLOMAX Take 0.4 mg by mouth daily after supper.   VANADYL SULFATE PO Take 1 capsule by mouth daily.   zinc gluconate 50 MG tablet Take 50 mg by mouth daily.        Recent Results (from the past 2160 hour(s))  Comp Met (CMET)     Status: Abnormal   Collection Time: 12/18/15  2:59 PM  Result Value Ref Range   Sodium 139 135 - 145 mEq/L   Potassium 4.7 3.5 - 5.1 mEq/L   Chloride 112 96 - 112 mEq/L   CO2 20 19 - 32 mEq/L   Glucose, Bld 127 (H) 70 - 99 mg/dL   BUN 41 (H) 6 - 23 mg/dL   Creatinine, Ser 2.02 (H) 0.40 - 1.50 mg/dL   Total Bilirubin 0.4 0.2 - 1.2 mg/dL    Alkaline Phosphatase 60 39 - 117 U/L   AST 18 0 - 37 U/L   ALT 18 0 - 53 U/L   Total Protein 5.9 (L) 6.0 - 8.3 g/dL   Albumin 3.7 3.5 - 5.2 g/dL   Calcium 8.3 (L) 8.4 - 10.5 mg/dL   GFR 33.50 (L) >60.00 mL/min  HgB A1c     Status: None   Collection Time: 12/18/15  2:59 PM  Result Value Ref Range   Hgb A1c MFr Bld 5.7 4.6 - 6.5 %    Comment: Glycemic Control Guidelines for People with Diabetes:Non Diabetic:  <6%Goal of Therapy: <7%Additional Action Suggested:  >8%   Vitamin D (25 hydroxy)     Status: None   Collection Time: 12/18/15  2:59 PM  Result Value Ref Range   VITD 32.34 30.00 - 100.00 ng/mL  CBC w/Diff     Status: Abnormal   Collection Time: 12/18/15  2:59 PM  Result Value Ref Range   WBC 8.7 4.0 - 10.5 K/uL   RBC 3.29 (L) 4.22 - 5.81 Mil/uL   Hemoglobin 10.0 (L) 13.0 - 17.0 g/dL   HCT 30.0 (L) 39.0 - 52.0 %   MCV 91.3 78.0 - 100.0 fl   MCHC 33.3 30.0 - 36.0 g/dL   RDW 16.2 (H) 11.5 - 15.5 %   Platelets 264.0 150.0 - 400.0 K/uL   Neutrophils Relative % 62.2 43.0 - 77.0 %   Lymphocytes Relative 26.7 12.0 - 46.0 %   Monocytes Relative 8.8 3.0 - 12.0 %   Eosinophils Relative 2.0 0.0 - 5.0 %   Basophils Relative 0.3 0.0 - 3.0 %   Neutro Abs 5.4 1.4 - 7.7 K/uL   Lymphs Abs 2.3 0.7 - 4.0 K/uL   Monocytes Absolute 0.8 0.1 - 1.0 K/uL   Eosinophils Absolute 0.2 0.0 - 0.7 K/uL   Basophils Absolute 0.0 0.0 - 0.1 K/uL  No results found.   ROS: 14 pt review of systems performed and negative (unless mentioned in an HPI)  Objective: BP 135/64 (BP Location: Right Arm, Patient Position: Sitting, Cuff Size: Normal)   Pulse (!) 58   Temp 97.3 F (36.3 C) (Oral)   Resp 20   Ht '5\' 8"'  (1.727 m)   Wt 152 lb (68.9 kg)   SpO2 100%   BMI 23.11 kg/m   Orthostatics negative Gen: Afebrile. No acute distress. Nontoxic in appearance, well-developed, well-nourished,  pleasant Caucasian male. HENT: AT. [place]. MMM Eyes:Pupils Equal Round Reactive to light, Extraocular movements  intact,  Conjunctiva without redness, discharge or icterus. Neck/lymp/endocrine: Supple, no lymphadenopathy, no thyromegaly CV: RRR, no edema, right carotid bruits. No JVD. Neuro/Msk:  Normal gait. PERLA. EOMi. Alert. Oriented x3.   Psych: Normal affect, dress and demeanor. Normal speech. Normal thought content and judgment.  Assessment/plan: [name] is a 82 y.o. male present for establishment of care with concerns of orthostatic hypotension and postprandial hypotension. Dizziness/proteinuria/anemia/CKD/Vit D/CKD stage 4/weakness/presyncope - Discussion with pt today surrounding prior lab results at PCP 6 months and here last week.  - Pt to have anemia work up. Concern with pts repeated complaints of dizziness/presyncope episodes.  - Cardiac workup in May reassuring.  - trial off flomax. Dizziness/orthostatic could be partially from flomax, pt states he has been on this for years, initially did not recall having urinary problems, but now endorses mild urinary issues a long time ago. Pt agreeable to have a trial off medication, if difficulties return, he can resume use.  - No NSAIDs - Risk factor control: Hypertension and diabetes, well controlled.  - Positive microalbumin - Renally dose medications: Allopurinol decreased to 150 mg daily.Eliquis 2.5 mg BID.  - Discontinued Colchicine today - ViTD well supplemented, will check for renal/PTH/Ca?V - UPEP/UIFE/Light Chains/TP, 24-Hr Ur; Future - TSH - Basic Metabolic Panel (BMET) - Urine Microalbumin w/creat. ratio - Urinalysis, Routine w reflex microscopic - PTH, Intact and Calcium - POC Hemoccult Bld/Stl (3-Cd Home Screen); Future - Phosphorus - Magnesium - Vitamin D 1,25 dihydroxy - Vitamin D 25 hydroxy - Iron Binding Cap (TIBC) - Ferritin - Will wait for above results and refer to nephrology if indicated.   > 25 minutes spent with patient, >50% of time spent face to face counseling patient and coordinating  care.    Routinely scheduled 6 month follow-ups on chronic medical conditions  Yearly Medicare wellness  Electronically signed by: Howard Pouch, [place]

## 2015-12-27 DIAGNOSIS — E559 Vitamin D deficiency, unspecified: Secondary | ICD-10-CM | POA: Insufficient documentation

## 2015-12-27 DIAGNOSIS — D649 Anemia, unspecified: Secondary | ICD-10-CM | POA: Insufficient documentation

## 2015-12-27 DIAGNOSIS — R531 Weakness: Secondary | ICD-10-CM | POA: Insufficient documentation

## 2015-12-27 DIAGNOSIS — R803 Bence Jones proteinuria: Secondary | ICD-10-CM | POA: Insufficient documentation

## 2015-12-27 DIAGNOSIS — R42 Dizziness and giddiness: Secondary | ICD-10-CM | POA: Insufficient documentation

## 2015-12-27 LAB — URINALYSIS, ROUTINE W REFLEX MICROSCOPIC
Bilirubin Urine: NEGATIVE
GLUCOSE, UA: NEGATIVE
HGB URINE DIPSTICK: NEGATIVE
KETONES UR: NEGATIVE
LEUKOCYTES UA: NEGATIVE
NITRITE: NEGATIVE
PH: 5.5 (ref 5.0–8.0)
PROTEIN: NEGATIVE
Specific Gravity, Urine: 1.017 (ref 1.001–1.035)

## 2015-12-27 LAB — VITAMIN D 25 HYDROXY (VIT D DEFICIENCY, FRACTURES): Vit D, 25-Hydroxy: 43 ng/mL (ref 30–100)

## 2015-12-27 LAB — PTH, INTACT AND CALCIUM
Calcium: 8.9 mg/dL (ref 8.6–10.3)
PTH: 45 pg/mL (ref 14–64)

## 2015-12-27 LAB — MICROALBUMIN / CREATININE URINE RATIO
CREATININE, URINE: 127 mg/dL (ref 20–370)
MICROALB UR: 0.8 mg/dL
MICROALB/CREAT RATIO: 6 ug/mg{creat} (ref <30)

## 2015-12-28 DIAGNOSIS — N184 Chronic kidney disease, stage 4 (severe): Secondary | ICD-10-CM | POA: Diagnosis not present

## 2015-12-28 DIAGNOSIS — D649 Anemia, unspecified: Secondary | ICD-10-CM | POA: Diagnosis not present

## 2015-12-28 DIAGNOSIS — R42 Dizziness and giddiness: Secondary | ICD-10-CM | POA: Diagnosis not present

## 2015-12-28 DIAGNOSIS — E559 Vitamin D deficiency, unspecified: Secondary | ICD-10-CM | POA: Diagnosis not present

## 2015-12-28 DIAGNOSIS — R809 Proteinuria, unspecified: Secondary | ICD-10-CM | POA: Diagnosis not present

## 2015-12-28 DIAGNOSIS — R531 Weakness: Secondary | ICD-10-CM | POA: Diagnosis not present

## 2015-12-28 LAB — PROTEIN ELECTROPHORESIS, SERUM
ALBUMIN ELP: 3.4 g/dL — AB (ref 3.8–4.8)
ALPHA-1-GLOBULIN: 0.4 g/dL — AB (ref 0.2–0.3)
ALPHA-2-GLOBULIN: 0.8 g/dL (ref 0.5–0.9)
BETA 2: 0.3 g/dL (ref 0.2–0.5)
BETA GLOBULIN: 0.4 g/dL (ref 0.4–0.6)
GAMMA GLOBULIN: 0.8 g/dL (ref 0.8–1.7)
Total Protein, Serum Electrophoresis: 6 g/dL — ABNORMAL LOW (ref 6.1–8.1)

## 2015-12-28 NOTE — Addendum Note (Signed)
Addended by: Ralph Dowdy on: 12/28/2015 03:38 PM   Modules accepted: Orders

## 2015-12-29 LAB — VITAMIN D 1,25 DIHYDROXY
VITAMIN D3 1, 25 (OH): 11 pg/mL
Vitamin D 1, 25 (OH)2 Total: 11 pg/mL — ABNORMAL LOW (ref 18–72)

## 2016-01-02 LAB — UPEP/UIFE/LIGHT CHAINS/TP, 24-HR UR
% BETA, URINE: 22.3 %
ALBUMIN, U: 10.6 %
ALPHA 1 URINE: 4.1 %
ALPHA-2-GLOBULIN, U: 21.6 %
FREE KAPPA LT CHAINS, UR: 272 mg/L — AB (ref 1.35–24.19)
FREE LAMBDA LT CHAINS, UR: 17.3 mg/L — AB (ref 0.24–6.66)
GAMMA GLOBULIN URINE: 41.4 %
KAPPA/LAMBDA RATIO, U: 15.72 — AB (ref 2.04–10.37)
PDF (Calculi): 0
Protein, 24H Urine: 112 mg/24 hr (ref 30–150)
Protein, Ur: 11.2 mg/dL

## 2016-01-02 LAB — IFE INTERPRETATION

## 2016-01-03 ENCOUNTER — Telehealth: Payer: Self-pay | Admitting: Family Medicine

## 2016-01-03 DIAGNOSIS — N184 Chronic kidney disease, stage 4 (severe): Secondary | ICD-10-CM

## 2016-01-03 DIAGNOSIS — D649 Anemia, unspecified: Secondary | ICD-10-CM

## 2016-01-03 DIAGNOSIS — R42 Dizziness and giddiness: Secondary | ICD-10-CM

## 2016-01-03 DIAGNOSIS — R634 Abnormal weight loss: Secondary | ICD-10-CM | POA: Insufficient documentation

## 2016-01-03 DIAGNOSIS — R803 Bence Jones proteinuria: Secondary | ICD-10-CM

## 2016-01-03 DIAGNOSIS — R531 Weakness: Secondary | ICD-10-CM

## 2016-01-03 DIAGNOSIS — I951 Orthostatic hypotension: Secondary | ICD-10-CM | POA: Insufficient documentation

## 2016-01-03 DIAGNOSIS — R55 Syncope and collapse: Secondary | ICD-10-CM | POA: Insufficient documentation

## 2016-01-03 NOTE — Telephone Encounter (Signed)
Discussed with pt results and my concerns with his symptoms and studies.  - Worsening anemia, unintentional weight loss, low back pain, fatigue, presyncope and POSITIVE free kappa/lambda light chains on UPEP, Low Vit D and worsening renal function. - nephrology and hematology referral placed.  - Pt was allowed opportunity to ask questions, he is in agreement with plan.

## 2016-01-04 ENCOUNTER — Other Ambulatory Visit (INDEPENDENT_AMBULATORY_CARE_PROVIDER_SITE_OTHER): Payer: Medicare Other

## 2016-01-04 ENCOUNTER — Telehealth: Payer: Self-pay | Admitting: Family Medicine

## 2016-01-04 DIAGNOSIS — K921 Melena: Secondary | ICD-10-CM

## 2016-01-04 LAB — HEMOCCULT SLIDES (X 3 CARDS)
Fecal Occult Blood: NEGATIVE
OCCULT 1: NEGATIVE
OCCULT 2: NEGATIVE
OCCULT 3: NEGATIVE
OCCULT 4: NEGATIVE
OCCULT 5: NEGATIVE

## 2016-01-04 NOTE — Telephone Encounter (Signed)
Please call pt: - his cards are normal, no blood.

## 2016-01-07 NOTE — Telephone Encounter (Signed)
Spoke with patient reviewed lab results. 

## 2016-01-11 DIAGNOSIS — I10 Essential (primary) hypertension: Secondary | ICD-10-CM | POA: Diagnosis not present

## 2016-01-11 DIAGNOSIS — I4891 Unspecified atrial fibrillation: Secondary | ICD-10-CM | POA: Diagnosis not present

## 2016-01-11 DIAGNOSIS — D472 Monoclonal gammopathy: Secondary | ICD-10-CM | POA: Diagnosis not present

## 2016-01-11 DIAGNOSIS — N183 Chronic kidney disease, stage 3 (moderate): Secondary | ICD-10-CM | POA: Diagnosis not present

## 2016-01-29 ENCOUNTER — Ambulatory Visit (HOSPITAL_BASED_OUTPATIENT_CLINIC_OR_DEPARTMENT_OTHER): Payer: Medicare Other | Admitting: Hematology & Oncology

## 2016-01-29 ENCOUNTER — Ambulatory Visit: Payer: Medicare Other

## 2016-01-29 ENCOUNTER — Encounter: Payer: Self-pay | Admitting: Hematology & Oncology

## 2016-01-29 ENCOUNTER — Other Ambulatory Visit (HOSPITAL_BASED_OUTPATIENT_CLINIC_OR_DEPARTMENT_OTHER): Payer: Medicare Other

## 2016-01-29 VITALS — BP 152/69 | HR 69 | Temp 97.8°F | Resp 16 | Ht 68.0 in | Wt 155.1 lb

## 2016-01-29 DIAGNOSIS — Z23 Encounter for immunization: Secondary | ICD-10-CM

## 2016-01-29 DIAGNOSIS — D631 Anemia in chronic kidney disease: Secondary | ICD-10-CM | POA: Diagnosis not present

## 2016-01-29 DIAGNOSIS — N189 Chronic kidney disease, unspecified: Secondary | ICD-10-CM

## 2016-01-29 DIAGNOSIS — D508 Other iron deficiency anemias: Secondary | ICD-10-CM

## 2016-01-29 DIAGNOSIS — E611 Iron deficiency: Secondary | ICD-10-CM | POA: Diagnosis not present

## 2016-01-29 DIAGNOSIS — R634 Abnormal weight loss: Secondary | ICD-10-CM

## 2016-01-29 DIAGNOSIS — N183 Chronic kidney disease, stage 3 (moderate): Principal | ICD-10-CM

## 2016-01-29 DIAGNOSIS — D509 Iron deficiency anemia, unspecified: Secondary | ICD-10-CM

## 2016-01-29 LAB — FERRITIN: FERRITIN: 72 ng/mL (ref 22–316)

## 2016-01-29 LAB — CBC WITH DIFFERENTIAL (CANCER CENTER ONLY)
BASO#: 0 10*3/uL (ref 0.0–0.2)
BASO%: 0.1 % (ref 0.0–2.0)
EOS%: 3.3 % (ref 0.0–7.0)
Eosinophils Absolute: 0.2 10*3/uL (ref 0.0–0.5)
HEMATOCRIT: 29.8 % — AB (ref 38.7–49.9)
HGB: 10 g/dL — ABNORMAL LOW (ref 13.0–17.1)
LYMPH#: 1.8 10*3/uL (ref 0.9–3.3)
LYMPH%: 26.7 % (ref 14.0–48.0)
MCH: 31.1 pg (ref 28.0–33.4)
MCHC: 33.6 g/dL (ref 32.0–35.9)
MCV: 93 fL (ref 82–98)
MONO#: 0.6 10*3/uL (ref 0.1–0.9)
MONO%: 8 % (ref 0.0–13.0)
NEUT%: 61.9 % (ref 40.0–80.0)
NEUTROS ABS: 4.3 10*3/uL (ref 1.5–6.5)
Platelets: 197 10*3/uL (ref 145–400)
RBC: 3.22 10*6/uL — ABNORMAL LOW (ref 4.20–5.70)
RDW: 15.2 % (ref 11.1–15.7)
WBC: 6.9 10*3/uL (ref 4.0–10.0)

## 2016-01-29 LAB — COMPREHENSIVE METABOLIC PANEL
ALT: 38 U/L (ref 0–55)
ANION GAP: 7 meq/L (ref 3–11)
AST: 33 U/L (ref 5–34)
Albumin: 3.4 g/dL — ABNORMAL LOW (ref 3.5–5.0)
Alkaline Phosphatase: 71 U/L (ref 40–150)
BILIRUBIN TOTAL: 0.4 mg/dL (ref 0.20–1.20)
BUN: 34.1 mg/dL — AB (ref 7.0–26.0)
CHLORIDE: 114 meq/L — AB (ref 98–109)
CO2: 20 meq/L — AB (ref 22–29)
CREATININE: 2.1 mg/dL — AB (ref 0.7–1.3)
Calcium: 8.8 mg/dL (ref 8.4–10.4)
EGFR: 28 mL/min/{1.73_m2} — ABNORMAL LOW (ref >90)
GLUCOSE: 110 mg/dL (ref 70–140)
Potassium: 5.5 mEq/L — ABNORMAL HIGH (ref 3.5–5.1)
Sodium: 141 mEq/L (ref 136–145)
TOTAL PROTEIN: 6.5 g/dL (ref 6.4–8.3)

## 2016-01-29 LAB — CHCC SATELLITE - SMEAR

## 2016-01-29 LAB — IRON AND TIBC
%SAT: 18 % — AB (ref 20–55)
Iron: 47 ug/dL (ref 42–163)
TIBC: 256 ug/dL (ref 202–409)
UIBC: 210 ug/dL (ref 117–376)

## 2016-01-29 MED ORDER — INFLUENZA VAC SPLIT QUAD 0.5 ML IM SUSY
0.5000 mL | PREFILLED_SYRINGE | Freq: Once | INTRAMUSCULAR | Status: DC
  Filled 2016-01-29: qty 0.5

## 2016-01-29 NOTE — Progress Notes (Signed)
Referral MD  Reason for Referral: Anemia-multifactorial   Chief Complaint  Patient presents with  . Follow-up  : I am not sure why I'm here except for that I have anemia.  HPI: [name] is well-known by me. He is an 82 year old white male. He had suggested church with me. I take care of his wife.  He has multiple medical problems. He has had cardiac surgery in the past. He is on ELIQUIS.  He has a found to be anemic. He's lost some weight. He is maintaining his weight right now.  He was seen by his family doctor. She went ahead and did much of tests on him. He does have renal insufficiency which she has had for a while.  He had a CBC done back in August. It showed a white cell count was 8.7 Hemoglobin 10. Platelet count 264,000. His MCV was 91. His electrolytes showed a sodium of 141. Potassium was 5.1. His creatinine was 1.8. He had a protein of 5.9 with an albumin of 3.7.  His iron studies showed a ferritin of 93 with an iron saturation of 18%.  An SPEP was done which did not show a monoclonal spike. He had a 24-hour urine done. I think this was a 24-hour urine. It showed 112 mg of protein. However, he was found have elevated  His Kappa light chain of  272 mg/L. His lambda was 17.3 mg/L. There is no monoclonal light chain noted.  However, because of his anemia and weight loss, it was felt that he needed to see a hematologist for further evaluation.  He has had no obvious bleeding. He has had no nausea or vomiting. He is not a vegetarian. He has had no rashes. He has had some leg swelling. There is been no change in his medications. He is on quite a few medications.  He does not smoke. He does not drink. There is no obvious occupational exposures.  He's had no bone issues. His last chest x-ray was about a year or so ago. The bones just showed degenerative changes. No obvious lytic lesions were noted.  Overall, his performance status is ECOG 1.    Past Medical History:   Diagnosis Date  . Basal cell carcinoma    skin  . Bigeminal rhythm   . Cataract   . Coronary artery disease    post bypass  . CVD (cardiovascular disease)   . Diabetes mellitus without complication ([place])   . Diverticular disease   . Easy bruising   . Gout   . Hernia   . Hyperlipidemia   . Hypertension   . Macular degeneration   . Microscopic colitis   . PVD (peripheral vascular disease) ([place])   :  Past Surgical History:  Procedure Laterality Date  . CARDIAC CATHETERIZATION  2006  . CARDIOVERSION N/A 02/22/2013   Procedure: CARDIOVERSION;  Surgeon: William Hector, MD;  Location: [facility] ENDOSCOPY;  Service: Cardiovascular;  Laterality: N/A;  . CARDIOVERSION N/A 11/15/2014   Procedure: CARDIOVERSION;  Surgeon: William Hector, MD;  Location: [place];  Service: Cardiovascular;  Laterality: N/A;  . CAROTID ENDARTERECTOMY  2009/ 1993   left/ right  . CORONARY ARTERY BYPASS GRAFT  1999  . EYE SURGERY     eyelid repair  . INGUINAL HERNIA REPAIR  02/11/2012   Procedure: LAPAROSCOPIC BILATERAL INGUINAL HERNIA REPAIR;  Surgeon: Pedro Earls, MD;  Location: [facility] ORS;  Service: General;  Laterality: Bilateral;  . SKIN CANCER EXCISION  right ear x 3  :   Current Outpatient Prescriptions:  .  allopurinol (ZYLOPRIM) 300 MG tablet, Take 150 mg by mouth daily. , Disp: , Rfl:  .  Aloe Vera 25 MG CAPS, Take 2 capsules by mouth daily., Disp: , Rfl:  .  aspirin 81 MG tablet, Take 81 mg by mouth at bedtime. , Disp: , Rfl:  .  atorvastatin (LIPITOR) 20 MG tablet, Take 20 mg by mouth at bedtime., Disp: , Rfl:  .  carvedilol (COREG) 6.25 MG tablet, Take 1 tablet (6.25 mg total) by mouth 2 (two) times daily., Disp: 60 tablet, Rfl: 2 .  cholecalciferol (VITAMIN D) 1000 units tablet, Take 5,000 Units by mouth daily., Disp: , Rfl:  .  CHROMIUM GTF PO, Take 2 capsules by mouth daily., Disp: , Rfl:  .  Cinnamon 500 MG TABS, Take 2 tablets by mouth at bedtime. , Disp: , Rfl:  .  Coenzyme Q10 (CO  Q 10) 100 MG CAPS, Take 1 capsule by mouth daily. , Disp: , Rfl:  .  ELIQUIS 2.5 MG TABS tablet, TAKE 1 TABLET TWICE A DAY, Disp: 180 tablet, Rfl: 1 .  fish oil-omega-3 fatty acids 1000 MG capsule, Take 1 g by mouth daily. , Disp: , Rfl:  .  lisinopril (PRINIVIL,ZESTRIL) 20 MG tablet, Take 1 tablet (20 mg total) by mouth daily., Disp: 90 tablet, Rfl: 3 .  Multiple Vitamins-Minerals (PRESERVISION AREDS PO), Take 1 tablet by mouth 2 (two) times daily. , Disp: , Rfl:  .  nitroGLYCERIN (NITROSTAT) 0.4 MG SL tablet, Place 0.4 mg under the tongue every 5 (five) minutes as needed (up to 3 doses only). For chest pain, Disp: , Rfl:  .  OVER THE COUNTER MEDICATION, Take 2 tablets by mouth every evening. GlucoCare, Disp: , Rfl:  .  Probiotic Product (PROBIOTIC PO), Take 1 tablet by mouth daily., Disp: , Rfl:  .  Tamsulosin HCl (FLOMAX) 0.4 MG CAPS, Take 0.4 mg by mouth daily after supper., Disp: , Rfl:  .  VANADYL SULFATE PO, Take 1 capsule by mouth daily., Disp: , Rfl:  .  zinc gluconate 50 MG tablet, Take 50 mg by mouth daily. , Disp: , Rfl:   Current Facility-Administered Medications:  .  Influenza vac split quadrivalent PF (FLUARIX) injection 0.5 mL, 0.5 mL, Intramuscular, Once, [name], MD:  . Influenza vac split quadrivalent PF  0.5 mL Intramuscular Once  :  Allergies  Allergen Reactions  . Penicillins Hives  :  Family History  Problem Relation Age of Onset  . Stroke Mother   . Coronary artery disease Father   . Heart disease Father   . Melanoma Sister   :  Social History   Social History  . Marital status: Married    Spouse name: N/A  . Number of children: N/A  . Years of education: N/A   Occupational History  . retired    Social History Main Topics  . Smoking status: Former Smoker    Quit date: 12/25/1961  . Smokeless tobacco: Former Systems developer    Types: Chew    Quit date: 02/03/1969  . Alcohol use No  . Drug use: No  . Sexual activity: No   Other Topics Concern  .  Not on file   Social History Narrative   Married to [place], 2 children [name] and [name].   Some college. Retired from [employer].   Drinks caffeine, uses herbal remedies, takes a daily vitamin.   Wears his seatbelt,  smoke detector at home, firearms in the home.   Wears a hearing aid.   Feels safe in her relationships.  :  Pertinent items are noted in HPI.  Exam: @IPVITALS @  Elderly white male in no obvious distress. Vital signs are temperature of 97.8. Pulse 69. Blood pressure 152/69. Weight is 155 pounds. Head and neck exam shows no ocular or oral lesions. There are no palpable cervical or supraclavicular lymph nodes. Lungs are clear bilaterally. Cardiac exam regular rate and rhythm with no murmurs, rubs or bruits. Abdomen is soft. He has good bowel sounds. There is no fluid wave. There is no palpable liver or spleen tip. Back exam shows some slight kyphosis. There is no tenderness over the spine, ribs or hips. Extremities shows no clubbing, cyanosis or edema. Neurological exam shows no focal neurological deficits.    Recent Labs  01/29/16 1055  WBC 6.9  HGB 10.0*  HCT 29.8*  PLT 197   No results for input(s): NA, K, CL, CO2, GLUCOSE, BUN, CREATININE, CALCIUM in the last 72 hours.  Blood smear review:  No chronic and normocytic probably some red blood cells. There is no nucleated red blood cells. There are no teardrop cells. I see no rib loafer formation. He has no target cells. He has no schistocytes or spherocytes. White cells per normal in morphology maturation. There's no immature myeloid or lymphoid forms. I see no hypersegmented polys. Platelets are adequate in number and size. Platelets are well granulated.  Pathology: None     Assessment and Plan:  Mr. Mapel is an 82 year old white male. He has multifactorial anemia. I just think that there is any plasma cell disorder. The urine does not show any monoclonal light chains. He has both elevated lambda and Kappa light  chains. There is no monoclonal spike in his serum.  I'm not sure why his renal insufficiency just Be from high blood pressure, age, and may be diabetes.  I suspect that his erythropoietin level is going be quite low.  His iron studies show that his iron is borderline.  I really don't see that we have a hematologic malignancy. At his age, we always need to think about myelodysplasia area did however, I really don't think that we have to put him through a bone marrow test.  I spent about 45 minutes with him. I see him in sundaes at church. He is part of our choir.  I think we can keep it simple. Again, I just don't think that we are dealing with any kind of plasma cell disorder. We might want to repeat his 24-hour urine in the future.  I will plan to get him back at we get all of her lab results in. Once we get them in, then we can figure out what else needs to get done.

## 2016-01-30 LAB — RETICULOCYTES: RETICULOCYTE COUNT: 1.7 % (ref 0.6–2.6)

## 2016-01-30 LAB — TESTOSTERONE: Testosterone, Serum: 419 ng/dL (ref 264–916)

## 2016-01-30 LAB — ERYTHROPOIETIN: Erythropoietin: 9.5 m[IU]/mL (ref 2.6–18.5)

## 2016-01-31 LAB — PROTEIN ELECTROPHORESIS, SERUM, WITH REFLEX
A/G RATIO SPE: 1.4 (ref 0.7–1.7)
ALBUMIN: 3.4 g/dL (ref 2.9–4.4)
ALPHA 1: 0.2 g/dL (ref 0.0–0.4)
Alpha 2: 0.7 g/dL (ref 0.4–1.0)
Beta: 0.9 g/dL (ref 0.7–1.3)
Gamma Globulin: 0.7 g/dL (ref 0.4–1.8)
Globulin, Total: 2.5 g/dL (ref 2.2–3.9)
PDF: 0
Total Protein: 5.9 g/dL — ABNORMAL LOW (ref 6.0–8.5)

## 2016-02-04 ENCOUNTER — Encounter: Payer: Self-pay | Admitting: Hematology & Oncology

## 2016-02-04 DIAGNOSIS — D509 Iron deficiency anemia, unspecified: Secondary | ICD-10-CM | POA: Insufficient documentation

## 2016-02-04 DIAGNOSIS — D631 Anemia in chronic kidney disease: Secondary | ICD-10-CM

## 2016-02-04 HISTORY — DX: Iron deficiency anemia, unspecified: D50.9

## 2016-02-04 HISTORY — DX: Anemia in chronic kidney disease: D63.1

## 2016-02-04 NOTE — Addendum Note (Signed)
Addended by: Burney Gauze R on: 02/04/2016 05:58 PM   Modules accepted: Orders

## 2016-02-04 NOTE — Progress Notes (Signed)
I spoke with Mr. Pirani today. I told him that the results of his lab work showed that his bone marrow was not getting the message about making more red cells. I told him that the erythropoietin level was very low. I told him that we could improve this I injections under the skin, called Aranesp, that will help with his anemia.  I also told him that his iron was low. His iron saturation is only 18%. I think that he is somewhat iron deficient. We will need to give him a dose of IV iron for we give the Aranesp.  We will try to set this up for later on this week. I want to try to get this started quickly so that we can get him to feel better so that he will be more active with the holidays coming up in the future.  He understands this. We will have our scheduler all him to get the appointments set up.  Lattie Haw, MD

## 2016-02-07 ENCOUNTER — Ambulatory Visit (HOSPITAL_BASED_OUTPATIENT_CLINIC_OR_DEPARTMENT_OTHER): Payer: Medicare Other

## 2016-02-07 VITALS — BP 128/48 | HR 47 | Temp 97.0°F | Resp 20

## 2016-02-07 DIAGNOSIS — D509 Iron deficiency anemia, unspecified: Secondary | ICD-10-CM | POA: Diagnosis not present

## 2016-02-07 DIAGNOSIS — Z23 Encounter for immunization: Secondary | ICD-10-CM | POA: Diagnosis not present

## 2016-02-07 DIAGNOSIS — N183 Chronic kidney disease, stage 3 (moderate): Secondary | ICD-10-CM

## 2016-02-07 DIAGNOSIS — D631 Anemia in chronic kidney disease: Secondary | ICD-10-CM

## 2016-02-07 MED ORDER — FERUMOXYTOL INJECTION 510 MG/17 ML
510.0000 mg | Freq: Once | INTRAVENOUS | Status: AC
  Administered 2016-02-07: 510 mg via INTRAVENOUS
  Filled 2016-02-07: qty 17

## 2016-02-07 MED ORDER — INFLUENZA VAC SPLIT QUAD 0.5 ML IM SUSY
0.5000 mL | PREFILLED_SYRINGE | Freq: Once | INTRAMUSCULAR | Status: AC
  Administered 2016-02-07: 0.5 mL via INTRAMUSCULAR
  Filled 2016-02-07: qty 0.5

## 2016-02-07 NOTE — Patient Instructions (Signed)
Influenza Virus Vaccine injection What is this medicine? INFLUENZA VIRUS VACCINE (in floo EN zuh VAHY ruhs vak SEEN) helps to reduce the risk of getting influenza also known as the flu. The vaccine only helps protect you against some strains of the flu. This medicine may be used for other purposes; ask your health care provider or pharmacist if you have questions. What should I tell my health care provider before I take this medicine? They need to know if you have any of these conditions: -bleeding disorder like hemophilia -fever or infection -Guillain-Barre syndrome or other neurological problems -immune system problems -infection with the human immunodeficiency virus (HIV) or AIDS -low blood platelet counts -multiple sclerosis -an unusual or allergic reaction to influenza virus vaccine, latex, other medicines, foods, dyes, or preservatives. Different brands of vaccines contain different allergens. Some may contain latex or eggs. Talk to your doctor about your allergies to make sure that you get the right vaccine. -pregnant or trying to get pregnant -breast-feeding How should I use this medicine? This vaccine is for injection into a muscle or under the skin. It is given by a health care professional. A copy of Vaccine Information Statements will be given before each vaccination. Read this sheet carefully each time. The sheet may change frequently. Talk to your healthcare provider to see which vaccines are right for you. Some vaccines should not be used in all age groups. Overdosage: If you think you have taken too much of this medicine contact a poison control center or emergency room at once. NOTE: This medicine is only for you. Do not share this medicine with others. What if I miss a dose? This does not apply. What may interact with this medicine? -chemotherapy or radiation therapy -medicines that lower your immune system like etanercept, anakinra, infliximab, and adalimumab -medicines  that treat or prevent blood clots like warfarin -phenytoin -steroid medicines like prednisone or cortisone -theophylline -vaccines This list may not describe all possible interactions. Give your health care provider a list of all the medicines, herbs, non-prescription drugs, or dietary supplements you use. Also tell them if you smoke, drink alcohol, or use illegal drugs. Some items may interact with your medicine. What should I watch for while using this medicine? Report any side effects that do not go away within 3 days to your doctor or health care professional. Call your health care provider if any unusual symptoms occur within 6 weeks of receiving this vaccine. You may still catch the flu, but the illness is not usually as bad. You cannot get the flu from the vaccine. The vaccine will not protect against colds or other illnesses that may cause fever. The vaccine is needed every year. What side effects may I notice from receiving this medicine? Side effects that you should report to your doctor or health care professional as soon as possible: -allergic reactions like skin rash, itching or hives, swelling of the face, lips, or tongue Side effects that usually do not require medical attention (report to your doctor or health care professional if they continue or are bothersome): -fever -headache -muscle aches and pains -pain, tenderness, redness, or swelling at the injection site -tiredness This list may not describe all possible side effects. Call your doctor for medical advice about side effects. You may report side effects to FDA at 1-800-FDA-1088. Where should I keep my medicine? The vaccine will be given by a health care professional in a clinic, pharmacy, doctor's office, or other health care setting. You will not   be given vaccine doses to store at home. NOTE: This sheet is a summary. It may not cover all possible information. If you have questions about this medicine, talk to your  doctor, pharmacist, or health care provider.    2016, [employer]. (2014-11-03 10:07:28) Ferumoxytol injection What is this medicine? FERUMOXYTOL is an iron complex. Iron is used to make healthy red blood cells, which carry oxygen and nutrients throughout the body. This medicine is used to treat iron deficiency anemia in people with chronic kidney disease. This medicine may be used for other purposes; ask your health care provider or pharmacist if you have questions. What should I tell my health care provider before I take this medicine? They need to know if you have any of these conditions: -anemia not caused by low iron levels -high levels of iron in the blood -magnetic resonance imaging (MRI) test scheduled -an unusual or allergic reaction to iron, other medicines, foods, dyes, or preservatives -pregnant or trying to get pregnant -breast-feeding How should I use this medicine? This medicine is for injection into a vein. It is given by a health care professional in a hospital or clinic setting. Talk to your pediatrician regarding the use of this medicine in children. Special care may be needed. Overdosage: If you think you have taken too much of this medicine contact a poison control center or emergency room at once. NOTE: This medicine is only for you. Do not share this medicine with others. What if I miss a dose? It is important not to miss your dose. Call your doctor or health care professional if you are unable to keep an appointment. What may interact with this medicine? This medicine may interact with the following medications: -other iron products This list may not describe all possible interactions. Give your health care provider a list of all the medicines, herbs, non-prescription drugs, or dietary supplements you use. Also tell them if you smoke, drink alcohol, or use illegal drugs. Some items may interact with your medicine. What should I watch for while using this  medicine? Visit your doctor or healthcare professional regularly. Tell your doctor or healthcare professional if your symptoms do not start to get better or if they get worse. You may need blood work done while you are taking this medicine. You may need to follow a special diet. Talk to your doctor. Foods that contain iron include: whole grains/cereals, dried fruits, beans, or peas, leafy green vegetables, and organ meats (liver, kidney). What side effects may I notice from receiving this medicine? Side effects that you should report to your doctor or health care professional as soon as possible: -allergic reactions like skin rash, itching or hives, swelling of the face, lips, or tongue -breathing problems -changes in blood pressure -feeling faint or lightheaded, falls -fever or chills -flushing, sweating, or hot feelings -swelling of the ankles or feet Side effects that usually do not require medical attention (Report these to your doctor or health care professional if they continue or are bothersome.): -diarrhea -headache -nausea, vomiting -stomach pain This list may not describe all possible side effects. Call your doctor for medical advice about side effects. You may report side effects to FDA at 1-800-FDA-1088. Where should I keep my medicine? This drug is given in a hospital or clinic and will not be stored at home. NOTE: This sheet is a summary. It may not cover all possible information. If you have questions about this medicine, talk to your doctor, pharmacist, or health care  provider.    2016, [employer]. (2011-11-28 15:23:36)

## 2016-02-08 ENCOUNTER — Ambulatory Visit: Payer: Medicare Other

## 2016-02-11 ENCOUNTER — Other Ambulatory Visit: Payer: Self-pay | Admitting: Cardiovascular Disease

## 2016-02-13 DIAGNOSIS — H353123 Nonexudative age-related macular degeneration, left eye, advanced atrophic without subfoveal involvement: Secondary | ICD-10-CM | POA: Diagnosis not present

## 2016-02-13 DIAGNOSIS — H43813 Vitreous degeneration, bilateral: Secondary | ICD-10-CM | POA: Diagnosis not present

## 2016-02-13 DIAGNOSIS — H35433 Paving stone degeneration of retina, bilateral: Secondary | ICD-10-CM | POA: Diagnosis not present

## 2016-02-13 DIAGNOSIS — H353114 Nonexudative age-related macular degeneration, right eye, advanced atrophic with subfoveal involvement: Secondary | ICD-10-CM | POA: Diagnosis not present

## 2016-02-14 ENCOUNTER — Ambulatory Visit: Payer: Medicare Other

## 2016-02-14 ENCOUNTER — Other Ambulatory Visit: Payer: Self-pay

## 2016-02-14 ENCOUNTER — Other Ambulatory Visit (HOSPITAL_BASED_OUTPATIENT_CLINIC_OR_DEPARTMENT_OTHER): Payer: Medicare Other

## 2016-02-14 VITALS — BP 197/60 | HR 89

## 2016-02-14 DIAGNOSIS — D509 Iron deficiency anemia, unspecified: Secondary | ICD-10-CM

## 2016-02-14 DIAGNOSIS — D631 Anemia in chronic kidney disease: Secondary | ICD-10-CM | POA: Diagnosis present

## 2016-02-14 DIAGNOSIS — N189 Chronic kidney disease, unspecified: Secondary | ICD-10-CM | POA: Diagnosis not present

## 2016-02-14 DIAGNOSIS — N183 Chronic kidney disease, stage 3 unspecified: Secondary | ICD-10-CM

## 2016-02-14 LAB — HEMOGLOBIN AND HEMATOCRIT, BLOOD
HEMATOCRIT: 27.6 % — AB (ref 38.7–49.9)
HEMOGLOBIN: 9.2 g/dL — AB (ref 13.0–17.1)

## 2016-02-14 MED ORDER — DARBEPOETIN ALFA 300 MCG/0.6ML IJ SOSY
PREFILLED_SYRINGE | INTRAMUSCULAR | Status: AC
  Filled 2016-02-14: qty 0.6

## 2016-02-14 MED ORDER — DARBEPOETIN ALFA 300 MCG/0.6ML IJ SOSY: 300.0000 ug | PREFILLED_SYRINGE | Freq: Once | INTRAMUSCULAR | Status: DC

## 2016-02-14 NOTE — Progress Notes (Signed)
Patient unable to get Aranesp injection today due to high blood pressure  See flow sheet for vital sign. Patient will come back tomorrow for Aranesp.

## 2016-02-15 ENCOUNTER — Encounter: Payer: Self-pay | Admitting: Family Medicine

## 2016-02-15 ENCOUNTER — Ambulatory Visit: Payer: Medicare Other

## 2016-02-15 ENCOUNTER — Other Ambulatory Visit: Payer: Medicare Other

## 2016-02-15 ENCOUNTER — Ambulatory Visit (INDEPENDENT_AMBULATORY_CARE_PROVIDER_SITE_OTHER): Payer: Medicare Other | Admitting: Family Medicine

## 2016-02-15 VITALS — BP 144/55 | HR 67 | Temp 97.8°F | Resp 20 | Wt 157.5 lb

## 2016-02-15 DIAGNOSIS — I6523 Occlusion and stenosis of bilateral carotid arteries: Secondary | ICD-10-CM

## 2016-02-15 DIAGNOSIS — I10 Essential (primary) hypertension: Secondary | ICD-10-CM | POA: Diagnosis not present

## 2016-02-15 DIAGNOSIS — R030 Elevated blood-pressure reading, without diagnosis of hypertension: Secondary | ICD-10-CM

## 2016-02-15 MED ORDER — CARVEDILOL 12.5 MG PO TABS: 12.5000 mg | ORAL_TABLET | Freq: Two times a day (BID) | ORAL | Status: DC

## 2016-02-15 NOTE — Patient Instructions (Signed)
Keep medication the same with the exception of increasing your Carvedilol back to the 12.5 mg every 12 hours.  Continue to monitor your BP at least 2 hours after taking BP medications.  150/ 90--> call the office. If you get any dizziness or BP is LOW < 110/40 then also call.

## 2016-02-15 NOTE — Progress Notes (Signed)
Patient ID: [name], male  DOB: [birth year], 82 y.o.   MRN: [id number] Patient Care Team    Relationship Specialty Notifications Start End  [name], DO PCP - General Family Medicine  12/18/15   [name], MD Consulting Physician Ophthalmology  12/18/15   [name], MD Consulting Physician Dermatology  12/18/15   [name], MD Consulting Physician Cardiology  12/18/15   [name], MD Consulting Physician Ophthalmology  12/19/15   Laurence Spates, MD Consulting Physician Gastroenterology  12/19/15     Subjective:  [name] is a 82 y.o.  male present for elevated BP x2 this week.   Pt was at his appt to receive his Aranesp infusion and has been unable to receive the infusion secondary to two high readings each time he was present. Today he states he increased his carvedilol back to 12.5 mg BID since last night (two doses) from the 6.25 mg he had been taking. The dose was lowered by prior provider a few months ago secondary to hypotension and dizziness. Pt denies chest pain, dizziness, shortness of breath, fatigue or LE edema. He states his BP readings at home were also higher over the last couple days (~150-160/70). He has been eating better since he is feeling better after treatment initiation for his anemia. He states he tries to avoid high salty meals and does not add salt to his meals. The readings this morning (171/60) were taken a little over an hour after his medications were taken. They were taken with an automatic cuff.   Prior note: Dizziness/Hypertension: He states he has blood pressure problems. He feels that his blood pressure drops after he is active. He reports he gets dizzy if he stands up too quickly at times. When this occurs he feels that his vision gets very "bright" for about 15-20 minutes. He denies syncopal episodes, chest pain or shortness of breath during these events. He also endorses feeling the same sensation after eating meals on  occasions. He reports the lowest blood pressure he has seen at home has been 109/40, this occurred after he was playing golf and he was symptomatic. He denies any blood loss/rectal bleeding. He states he likely does not drink enough water. He states he did have meals prior to these events. He states these have been going on for a couple months. He was seen by cardiology and may with stress test, echocardiogram and Doppler studies. He states he does feel that his blood pressure is lower now than it was when he was being evaluated in May. He denies any medication changes. He takes a daily aspirin, eliquis 2.5 mg, Coreg 6.25 twice a day, lisinopril 20 mg. He also is prescribed Flomax, but doesn't recall having difficulties with urination. He has a history of abdominal and bilateral femoral bruits. F/U myovue 08/28/15 scar no ischemia  Nuclear stress EF: 70%.  There was no ST segment deviation noted during stress.  Defect 1: There is a medium defect of moderate severity present in the basal inferolateral, mid inferolateral and apical lateral location.  Findings consistent with prior myocardial infarction with a small amount of peri-infarct ischemia.  This is a low risk study.  The left ventricular ejection fraction is hyperdynamic (>65%).  Duplex 09/03/15  1-39% ICA normal vertebral flow  Normal caliber abdominal aorta. Aorto-iliac atherosclerosis. >50% bilateral common iliac artery disease, without focal stenosis.    Immunization History  Administered Date(s) Administered  . Influenza,inj,Quad PF,36+ Mos  02/07/2016  . Influenza-Unspecified 01/08/2015    Past Medical History:  Diagnosis Date  . Basal cell carcinoma    skin  . Bigeminal rhythm   . Cataract   . Coronary artery disease    post bypass  . CVD (cardiovascular disease)   . Diabetes mellitus without complication ([place])   . Diverticular disease   . Easy bruising   . 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 Gout   .  Hernia   . Hyperlipidemia   . Hypertension   . Iron deficiency anemia 02/04/2016  . Macular degeneration   . Microscopic colitis   . PVD (peripheral vascular disease) ([facility])    Allergies  Allergen Reactions  . Penicillins Hives   Past Surgical History:  Procedure Laterality Date  . CARDIAC CATHETERIZATION  2006  . CARDIOVERSION N/A 02/22/2013   Procedure: CARDIOVERSION;  Surgeon: [name], MD;  Location: [facility] ENDOSCOPY;  Service: Cardiovascular;  Laterality: N/A;  . CARDIOVERSION N/A 11/15/2014   Procedure: CARDIOVERSION;  Surgeon: [name], MD;  Location: [facility];  Service: Cardiovascular;  Laterality: N/A;  . CAROTID ENDARTERECTOMY  2009/ 1993   left/ right  . CORONARY ARTERY BYPASS GRAFT  1999  . EYE SURGERY     eyelid repair  . INGUINAL HERNIA REPAIR  02/11/2012   Procedure: LAPAROSCOPIC BILATERAL INGUINAL HERNIA REPAIR;  Surgeon: Pedro Earls, MD;  Location: [facility] ORS;  Service: General;  Laterality: Bilateral;  . SKIN CANCER EXCISION     right ear x 3   Family History  Problem Relation Age of Onset  . Stroke Mother   . Coronary artery disease Father   . Heart disease Father   . Melanoma Sister    Social History   Social History  . Marital status: Married    Spouse name: N/A  . Number of children: N/A  . Years of education: N/A   Occupational History  . retired    Social History Main Topics  . Smoking status: Former Smoker    Quit date: 12/25/1961  . Smokeless tobacco: Former Systems developer    Types: Chew    Quit date: 02/03/1969  . Alcohol use No  . Drug use: No  . Sexual activity: No   Other Topics Concern  . Not on file   Social History Narrative   Married to [place], 2 children [name] and [name].   Some college. Retired from [employer].   Drinks caffeine, uses herbal remedies, takes a daily vitamin.   Wears his seatbelt, smoke detector at home, firearms in the home.   Wears a hearing aid.   Feels safe in her relationships.       Medication List       Accurate as of 02/15/16 11:24 AM. Always use your most recent med list.          allopurinol 300 MG tablet Commonly known as:  ZYLOPRIM Take 150 mg by mouth daily.   Aloe Vera 25 MG Caps Take 2 capsules by mouth daily.   aspirin 81 MG tablet Take 81 mg by mouth at bedtime.   atorvastatin 20 MG tablet Commonly known as:  LIPITOR Take 20 mg by mouth at bedtime.   carvedilol 6.25 MG tablet Commonly known as:  COREG Take 1 tablet (6.25 mg total) by mouth 2 (two) times daily.   cholecalciferol 1000 units tablet Commonly known as:  VITAMIN D Take 5,000 Units by mouth daily.   CHROMIUM GTF PO Take 2 capsules by mouth daily.  Cinnamon 500 MG Tabs Take 2 tablets by mouth at bedtime.   Co Q 10 100 MG Caps Take 1 capsule by mouth daily.   ELIQUIS 2.5 MG Tabs tablet Generic drug:  apixaban TAKE 1 TABLET TWICE A DAY   fish oil-omega-3 fatty acids 1000 MG capsule Take 1 g by mouth daily.   lisinopril 20 MG tablet Commonly known as:  PRINIVIL,ZESTRIL Take 1 tablet (20 mg total) by mouth daily.   nitroGLYCERIN 0.4 MG SL tablet Commonly known as:  NITROSTAT Place 0.4 mg under the tongue every 5 (five) minutes as needed (up to 3 doses only). For chest pain   OVER THE COUNTER MEDICATION Take 2 tablets by mouth every evening. GlucoCare   PRESERVISION AREDS PO Take 1 tablet by mouth 2 (two) times daily.   PROBIOTIC PO Take 1 tablet by mouth daily.   tamsulosin 0.4 MG Caps capsule Commonly known as:  FLOMAX Take 0.4 mg by mouth daily after supper.   VANADYL SULFATE PO Take 1 capsule by mouth daily.   zinc gluconate 50 MG tablet Take 50 mg by mouth daily.        Recent Results (from the past 2160 hour(s))  Comp Met (CMET)     Status: Abnormal   Collection Time: 12/18/15  2:59 PM  Result Value Ref Range   Sodium 139 135 - 145 mEq/L   Potassium 4.7 3.5 - 5.1 mEq/L   Chloride 112 96 - 112 mEq/L   CO2 20 19 - 32 mEq/L   Glucose, Bld  127 (H) 70 - 99 mg/dL   BUN 41 (H) 6 - 23 mg/dL   Creatinine, Ser 2.02 (H) 0.40 - 1.50 mg/dL   Total Bilirubin 0.4 0.2 - 1.2 mg/dL   Alkaline Phosphatase 60 39 - 117 U/L   AST 18 0 - 37 U/L   ALT 18 0 - 53 U/L   Total Protein 5.9 (L) 6.0 - 8.3 g/dL   Albumin 3.7 3.5 - 5.2 g/dL   Calcium 8.3 (L) 8.4 - 10.5 mg/dL   GFR 33.50 (L) >60.00 mL/min  HgB A1c     Status: None   Collection Time: 12/18/15  2:59 PM  Result Value Ref Range   Hgb A1c MFr Bld 5.7 4.6 - 6.5 %    Comment: Glycemic Control Guidelines for People with Diabetes:Non Diabetic:  <6%Goal of Therapy: <7%Additional Action Suggested:  >8%   Vitamin D (25 hydroxy)     Status: None   Collection Time: 12/18/15  2:59 PM  Result Value Ref Range   VITD 32.34 30.00 - 100.00 ng/mL  CBC w/Diff     Status: Abnormal   Collection Time: 12/18/15  2:59 PM  Result Value Ref Range   WBC 8.7 4.0 - 10.5 K/uL   RBC 3.29 (L) 4.22 - 5.81 Mil/uL   Hemoglobin 10.0 (L) 13.0 - 17.0 g/dL   HCT 30.0 (L) 39.0 - 52.0 %   MCV 91.3 78.0 - 100.0 fl   MCHC 33.3 30.0 - 36.0 g/dL   RDW 16.2 (H) 11.5 - 15.5 %   Platelets 264.0 150.0 - 400.0 K/uL   Neutrophils Relative % 62.2 43.0 - 77.0 %   Lymphocytes Relative 26.7 12.0 - 46.0 %   Monocytes Relative 8.8 3.0 - 12.0 %   Eosinophils Relative 2.0 0.0 - 5.0 %   Basophils Relative 0.3 0.0 - 3.0 %   Neutro Abs 5.4 1.4 - 7.7 K/uL   Lymphs Abs 2.3 0.7 - 4.0 K/uL  Monocytes Absolute 0.8 0.1 - 1.0 K/uL   Eosinophils Absolute 0.2 0.0 - 0.7 K/uL   Basophils Absolute 0.0 0.0 - 0.1 K/uL  TSH     Status: None   Collection Time: 12/26/15  3:33 PM  Result Value Ref Range   TSH 1.85 0.40 - 4.50 mIU/L  Basic Metabolic Panel (BMET)     Status: Abnormal   Collection Time: 12/26/15  3:33 PM  Result Value Ref Range   Sodium 141 135 - 146 mmol/L   Potassium 5.1 3.5 - 5.3 mmol/L   Chloride 114 (H) 98 - 110 mmol/L   CO2 19 (L) 20 - 31 mmol/L   Glucose, Bld 88 65 - 99 mg/dL   BUN 35 (H) 7 - 25 mg/dL   Creat 1.80 (H)  0.70 - 1.11 mg/dL    Comment:   For patients > or = 80 years of age: The upper reference limit for Creatinine is approximately 13% higher for people identified as African-American.      Calcium 8.9 8.6 - 10.3 mg/dL  Urine Microalbumin w/creat. ratio     Status: None   Collection Time: 12/26/15  3:33 PM  Result Value Ref Range   Creatinine, Urine 127 20 - 370 mg/dL   Microalb, Ur 0.8 Not estab mg/dL   Microalb Creat Ratio 6 <30 mcg/mg creat    Comment: The ADA has defined abnormalities in albumin excretion as follows:           Category           Result                            (mcg/mg creatinine)                 Normal:    <30       Microalbuminuria:    30 - 299   Clinical albuminuria:    > or = 300   The ADA recommends that at least two of three specimens collected within a 3 - 6 month period be abnormal before considering a patient to be within a diagnostic category.     Urinalysis, Routine w reflex microscopic     Status: None   Collection Time: 12/26/15  3:33 PM  Result Value Ref Range   Color, Urine YELLOW YELLOW    Comment: The preferred specimen for urinalysis testing is the [employer] urine collection tube. Effective 01/19/2016, [employer], a [employer], will reject any urine cup received that is not transferred into the preferred [employer] tube. Using this device, specimen components remain stable in transit up to 72 hours at ambient temperatures. Urine may be collected in a clean, unused cup and transferred to the yellow cap transfer tube. Supply order number is: [id number]. If you have any questions, please contact your [facility] Account Representative directly, or call our Customer Service Department at [phone].    APPearance CLEAR CLEAR   Specific Gravity, Urine 1.017 1.001 - 1.035   pH 5.5 5.0 - 8.0   Glucose, UA NEGATIVE NEGATIVE   Bilirubin Urine NEGATIVE NEGATIVE   Ketones, ur NEGATIVE NEGATIVE   Hgb  urine dipstick NEGATIVE NEGATIVE   Protein, ur NEGATIVE NEGATIVE   Nitrite NEGATIVE NEGATIVE   Leukocytes, UA NEGATIVE NEGATIVE  PTH, Intact and Calcium     Status: None   Collection Time: 12/26/15  3:33 PM  Result Value Ref Range   PTH 45 14 -  64 pg/mL   Calcium 8.9 8.6 - 10.3 mg/dL    Comment:   Interpretive Guide:                              Intact PTH               Calcium                              ----------               ------- Normal Parathyroid           Normal                   Normal Hypoparathyroidism           Low or Low Normal        Low Hyperparathyroidism      Primary                 Normal or High           High      Secondary               High                     Normal or Low      Tertiary                High                     High Non-Parathyroid   Hypercalcemia              Low or Low Normal        High   Phosphorus     Status: None   Collection Time: 12/26/15  3:33 PM  Result Value Ref Range   Phosphorus 3.3 2.1 - 4.3 mg/dL  Magnesium     Status: None   Collection Time: 12/26/15  3:33 PM  Result Value Ref Range   Magnesium 1.6 1.5 - 2.5 mg/dL  Vitamin D 1,25 dihydroxy     Status: Abnormal   Collection Time: 12/26/15  3:33 PM  Result Value Ref Range   Vitamin D 1, 25 (OH)2 Total 11 (L) 18 - 72 pg/mL   Vitamin D3 1, 25 (OH)2 11 pg/mL   Vitamin D2 1, 25 (OH)2 <8 pg/mL    Comment: Vitamin D3, 1,25(OH)2 indicates both endogenous production and supplementation.  Vitamin D2, 1,25(OH)2 is an indicator of exogeous sources, such as diet or supplementation.  Interpretation and therapy are based on measurement of Vitamin D,1,25(OH)2, Total. This test was developed and its analytical performance characteristics have been determined by [facility], [place], [place]. It has not been cleared or approved by the FDA. This assay has been validated pursuant to the CLIA regulations and is used for clinical purposes.   Vitamin D 25 hydroxy      Status: None   Collection Time: 12/26/15  3:33 PM  Result Value Ref Range   Vit D, 25-Hydroxy 43 30 - 100 ng/mL    Comment: Vitamin D Status           25-OH Vitamin D        Deficiency                <20 ng/mL  Insufficiency         20 - 29 ng/mL        Optimal             > or = 30 ng/mL   For 25-OH Vitamin D testing on patients on D2-supplementation and patients for whom quantitation of D2 and D3 fractions is required, the Quest[facility]ureD 25-OH VIT D, (D2,D3), LC/MS/MS is recommended: order code [phone] (patients > 2 yrs).   Iron Binding Cap (TIBC)     Status: Abnormal   Collection Time: 12/26/15  3:33 PM  Result Value Ref Range   Iron 47 (L) 50 - 180 ug/dL   UIBC 209 125 - 400 ug/dL   TIBC 256 250 - 425 ug/dL   %SAT 18 15 - 60 %  Ferritin     Status: None   Collection Time: 12/26/15  3:33 PM  Result Value Ref Range   Ferritin 93 20 - 380 ng/mL  Protein electrophoresis, serum     Status: Abnormal   Collection Time: 12/26/15  3:33 PM  Result Value Ref Range   Total Protein, Serum Electrophoresis 6.0 (L) 6.1 - 8.1 g/dL   Albumin ELP 3.4 (L) 3.8 - 4.8 g/dL   Alpha-1-Globulin 0.4 (H) 0.2 - 0.3 g/dL   Alpha-2-Globulin 0.8 0.5 - 0.9 g/dL   Beta Globulin 0.4 0.4 - 0.6 g/dL   Beta 2 0.3 0.2 - 0.5 g/dL   Gamma Globulin 0.8 0.8 - 1.7 g/dL   Abnormal Protein Band1 NOT DET g/dL   SPE Interp. SEE NOTE     Comment: A poorly defined band of restricted protein mobility is detected in the gamma globulins. It is unlikely this may represent a monoclonal protein, however, immunofixation analysis is available if clinically indicated. Reviewed by Odis Hollingshead, MD, PhD, FCAP (Electronic Signature on File)    Abnormal Protein Band2 NOT DET g/dL   Abnormal Protein Band3 NOT DET g/dL  IFE Interpretation     Status: None   Collection Time: 12/26/15  3:33 PM  Result Value Ref Range   Immunofix Electr Int SEE NOTE     Comment: Normal pattern. No monoclonal proteins detected. Reviewed  by Francis Gaines Mammarappallil MD (Electronic Signature on File)   UPEP/UIFE/Light Chains/TP, 24-Hr Ur     Status: Abnormal   Collection Time: 12/28/15  3:38 PM  Result Value Ref Range   Protein, Ur 11.2 Not Estab. mg/dL   Protein, 24H Urine 112 30 - 150 mg/24 hr   ALBUMIN, U 10.6 %   ALPHA 1 URINE 4.1 %   ALPHA-2-GLOBULIN, U 21.6 %   % BETA, Urine 22.3 %   GAMMA GLOBULIN URINE 41.4 %   M-SPIKE, % Not Observed Not Observed %   Immunofixation, Urine Comment     Comment: An apparent normal immunofixation pattern.   NOTE: Comment     Comment: Protein electrophoresis scan will follow via computer, mail, or courier delivery.    PDF .    Free Kappa Lt Chains,Ur 272.00 (H) 1.35 - 24.19 mg/L    Comment: **Results verified by repeat testing**   Free Lambda Lt Chains,Ur 17.30 (H) 0.24 - 6.66 mg/L   Kappa/Lambda Ratio,U 15.72 (H) 2.04 - 10.37  Hemoccult Cards (X3 cards)     Status: None   Collection Time: 01/04/16  9:32 AM  Result Value Ref Range   OCCULT 1 Negative Negative   OCCULT 2 Negative Negative   OCCULT 3 Negative Negative   OCCULT 4 Negative Negative  OCCULT 5 Negative Negative   Fecal Occult Blood Negative Negative  CBC with Differential ([facility])     Status: Abnormal   Collection Time: 01/29/16 10:55 AM  Result Value Ref Range   WBC 6.9 4.0 - 10.0 10e3/uL   RBC 3.22 (L) 4.20 - 5.70 10e6/uL   HGB 10.0 (L) 13.0 - 17.1 g/dL   HCT 29.8 (L) 38.7 - 49.9 %   MCV 93 82 - 98 fL   MCH 31.1 28.0 - 33.4 pg   MCHC 33.6 32.0 - 35.9 g/dL   RDW 15.2 11.1 - 15.7 %   Platelets 197 145 - 400 10e3/uL   NEUT# 4.3 1.5 - 6.5 10e3/uL   LYMPH# 1.8 0.9 - 3.3 10e3/uL   MONO# 0.6 0.1 - 0.9 10e3/uL   Eosinophils Absolute 0.2 0.0 - 0.5 10e3/uL   BASO# 0.0 0.0 - 0.2 10e3/uL   NEUT% 61.9 40.0 - 80.0 %   LYMPH% 26.7 14.0 - 48.0 %   MONO% 8.0 0.0 - 13.0 %   EOS% 3.3 0.0 - 7.0 %   BASO% 0.1 0.0 - 2.0 %  Serum protein electrophoresis with reflex     Status: Abnormal   Collection Time:  01/29/16 10:55 AM  Result Value Ref Range   Total Protein 5.9 (L) 6.0 - 8.5 g/dL   Albumin 3.4 2.9 - 4.4 g/dL   Alpha 1 0.2 0.0 - 0.4 g/dL   Alpha 2 0.7 0.4 - 1.0 g/dL   Beta 0.9 0.7 - 1.3 g/dL   Gamma Globulin 0.7 0.4 - 1.8 g/dL   M-Spike, % Not Observed Not Observed g/dL   GLOBULIN, TOTAL 2.5 2.2 - 3.9 g/dL   A/G Ratio 1.4 0.7 - 1.7   Please Note: Comment     Comment: Protein electrophoresis scan will follow via computer, mail, or courier delivery.    Interpretation(See Below) Comment     Comment: The SPE pattern appears essentially unremarkable. Evidence of monoclonal protein is not apparent.    PDF .   Testosterone     Status: None   Collection Time: 01/29/16 10:55 AM  Result Value Ref Range   Testosterone, Serum 419 264 - 916 ng/dL    Comment: Adult male reference interval is based on a population of healthy nonobese males (BMI <30) between 58 and 52 years old. [place], [place] [id number]. PMID: [id number].   Erythropoietin     Status: None   Collection Time: 01/29/16 10:55 AM  Result Value Ref Range   Erythropoietin 9.5 2.6 - 18.5 mIU/mL    Comment: Siemens Immulite 2000 Immunochemiluminometric assay [facility])  Ferritin     Status: None   Collection Time: 01/29/16 10:55 AM  Result Value Ref Range   Ferritin 72 22 - 316 ng/ml  Reticulocytes     Status: None   Collection Time: 01/29/16 10:55 AM  Result Value Ref Range   Reticulocyte Count 1.7 0.6 - 2.6 %  [facility] - Smear     Status: None   Collection Time: 01/29/16 10:55 AM  Result Value Ref Range   Smear Result Smear Available   Iron and TIBC     Status: Abnormal   Collection Time: 01/29/16 10:55 AM  Result Value Ref Range   Iron 47 42 - 163 ug/dL   TIBC 256 202 - 409 ug/dL   UIBC 210 117 - 376 ug/dL   %SAT 18 (L) 20 - 55 %  Comprehensive metabolic panel     Status: Abnormal   Collection  Time: 01/29/16 10:55 AM  Result Value Ref Range   Sodium 141 136 - 145 mEq/L   Potassium 5.5 No visable  hemolysis (H) 3.5 - 5.1 mEq/L   Chloride 114 (H) 98 - 109 mEq/L   CO2 20 (L) 22 - 29 mEq/L   Glucose 110 70 - 140 mg/dl    Comment: Glucose reference range is for nonfasting patients. Fasting glucose reference range is 70- 100.   BUN 34.1 (H) 7.0 - 26.0 mg/dL   Creatinine 2.1 (H) 0.7 - 1.3 mg/dL   Total Bilirubin 0.40 0.20 - 1.20 mg/dL   Alkaline Phosphatase 71 40 - 150 U/L   AST 33 5 - 34 U/L   ALT 38 0 - 55 U/L   Total Protein 6.5 6.4 - 8.3 g/dL   Albumin 3.4 (L) 3.5 - 5.0 g/dL   Calcium 8.8 8.4 - 10.4 mg/dL   Anion Gap 7 3 - 11 mEq/L   EGFR 28 (L) >90 ml/min/1.73 m2    Comment: eGFR is calculated using the CKD-EPI Creatinine Equation (2009)  Hemoglobin and hematocrit, blood     Status: Abnormal   Collection Time: 02/14/16  1:31 PM  Result Value Ref Range   HGB 9.2 (L) 13.0 - 17.1 g/dL   HCT 27.6 (L) 38.7 - 49.9 %    No results found.   ROS: 14 pt review of systems performed and negative (unless mentioned in an HPI)  Objective: BP (!) 144/55 (BP Location: Right Arm, Patient Position: Sitting, Cuff Size: Normal)   Pulse 67   Temp 97.8 F (36.6 C)   Resp 20   Wt 157 lb 8 oz (71.4 kg)   SpO2 97%   BMI 23.95 kg/m   Gen: Afebrile. No acute distress. Nontoxic in appearance, well-developed, well-nourished,  pleasant Caucasian male. HENT: AT. [place]. MMM Eyes:Pupils Equal Round Reactive to light, Extraocular movements intact,  Conjunctiva without redness, discharge or icterus. CV: RRR, trace edema left LE,  No JVD. Neuro/Msk:  Normal gait. PERLA. EOMi. Alert. Oriented x3.   Psych: Normal affect, dress and demeanor. Normal speech. Normal thought content and judgment.  Assessment/plan: [name] is a 82 y.o. male present for elevated BP reading.  Essential hypertension/Elevated blood pressure reading Pt BP has been well controlled until a few weeks ago by flow sheets. He has gained a few pounds, likely from eating better since he is feeling better. He has not gained more  than he has weighed prior and with normal pressures. He does not appear fluid overloaded today and he states he is feeling good.  - His BP here today is normal. However that is on the increase carvedilol dose. I suspect he is eating better, maybe more sodium as well and now needs to return to the 12.5 BID carvedilol. I encouraged him to stay on that dosing over the weekend. Continue Low sodium diet, record BP at least 2 hours after taking medications. Parameters given to him to call immediately if experiencing or symptoms occur.  - If remains normal, he is encouraged to make certain he takes his medications at least 2-3 hours prior to his appt for infusion, make sure he arrives early enough to be relaxed at time of check in and if elevated again consider manual check.  Hopefully the increase in carvedilol back to 12.5 mg BID will resolve the elevated BP. He we call when he needs refills (updated chart on dose) he will finish his current dose/med by doubling . - pt has  follow up scheduled    Electronically signed by: Howard Pouch, DO [place]

## 2016-02-15 NOTE — Progress Notes (Signed)
Patient came back today to get Aranesp injection but his blood pressure still high. Spoke to Dr. Marin Olp and he want to hold injection and get patient back in a week for injection. See flowsheet for vital sign.

## 2016-02-20 ENCOUNTER — Other Ambulatory Visit: Payer: Self-pay | Admitting: *Deleted

## 2016-02-20 MED ORDER — TAMSULOSIN HCL 0.4 MG PO CAPS: 0.4000 mg | ORAL_CAPSULE | Freq: Every day | ORAL | 1 refills | Status: DC

## 2016-02-20 NOTE — Telephone Encounter (Signed)
flomax refilled

## 2016-02-22 ENCOUNTER — Ambulatory Visit (HOSPITAL_BASED_OUTPATIENT_CLINIC_OR_DEPARTMENT_OTHER): Payer: Medicare Other

## 2016-02-22 ENCOUNTER — Ambulatory Visit: Payer: Medicare Other

## 2016-02-22 VITALS — BP 154/49 | HR 43 | Temp 97.6°F | Resp 16

## 2016-02-22 DIAGNOSIS — D631 Anemia in chronic kidney disease: Secondary | ICD-10-CM

## 2016-02-22 DIAGNOSIS — D509 Iron deficiency anemia, unspecified: Secondary | ICD-10-CM

## 2016-02-22 DIAGNOSIS — N189 Chronic kidney disease, unspecified: Secondary | ICD-10-CM | POA: Diagnosis present

## 2016-02-22 DIAGNOSIS — N183 Chronic kidney disease, stage 3 (moderate): Secondary | ICD-10-CM

## 2016-02-22 MED ORDER — DARBEPOETIN ALFA 300 MCG/0.6ML IJ SOSY
PREFILLED_SYRINGE | INTRAMUSCULAR | Status: AC
  Filled 2016-02-22: qty 0.6

## 2016-02-22 MED ORDER — DARBEPOETIN ALFA 300 MCG/0.6ML IJ SOSY
300.0000 ug | PREFILLED_SYRINGE | Freq: Once | INTRAMUSCULAR | Status: AC
  Administered 2016-02-22: 300 ug via SUBCUTANEOUS

## 2016-02-22 NOTE — Patient Instructions (Signed)
Darbepoetin Alfa injection What is this medicine? DARBEPOETIN ALFA (dar be POE e tin AL fa) helps your body make more red blood cells. It is used to treat anemia caused by chronic kidney failure and chemotherapy. This medicine may be used for other purposes; ask your health care provider or pharmacist if you have questions. What should I tell my health care provider before I take this medicine? They need to know if you have any of these conditions: -blood clotting disorders or history of blood clots -cancer patient not on chemotherapy -cystic fibrosis -heart disease, such as angina, heart failure, or a history of a heart attack -hemoglobin level of 12 g/dL or greater -high blood pressure -low levels of folate, iron, or vitamin B12 -seizures -an unusual or allergic reaction to darbepoetin, erythropoietin, albumin, hamster proteins, latex, other medicines, foods, dyes, or preservatives -pregnant or trying to get pregnant -breast-feeding How should I use this medicine? This medicine is for injection into a vein or under the skin. It is usually given by a health care professional in a hospital or clinic setting. If you get this medicine at home, you will be taught how to prepare and give this medicine. Do not shake the solution before you withdraw a dose. Use exactly as directed. Take your medicine at regular intervals. Do not take your medicine more often than directed. It is important that you put your used needles and syringes in a special sharps container. Do not put them in a trash can. If you do not have a sharps container, call your pharmacist or healthcare provider to get one. Talk to your pediatrician regarding the use of this medicine in children. While this medicine may be used in children as young as 1 year for selected conditions, precautions do apply. Overdosage: If you think you have taken too much of this medicine contact a poison control center or emergency room at once. NOTE:  This medicine is only for you. Do not share this medicine with others. What if I miss a dose? If you miss a dose, take it as soon as you can. If it is almost time for your next dose, take only that dose. Do not take double or extra doses. What may interact with this medicine? Do not take this medicine with any of the following medications: -epoetin alfa This list may not describe all possible interactions. Give your health care provider a list of all the medicines, herbs, non-prescription drugs, or dietary supplements you use. Also tell them if you smoke, drink alcohol, or use illegal drugs. Some items may interact with your medicine. What should I watch for while using this medicine? Visit your prescriber or health care professional for regular checks on your progress and for the needed blood tests and blood pressure measurements. It is especially important for the doctor to make sure your hemoglobin level is in the desired range, to limit the risk of potential side effects and to give you the best benefit. Keep all appointments for any recommended tests. Check your blood pressure as directed. Ask your doctor what your blood pressure should be and when you should contact him or her. As your body makes more red blood cells, you may need to take iron, folic acid, or vitamin B supplements. Ask your doctor or health care provider which products are right for you. If you have kidney disease continue dietary restrictions, even though this medication can make you feel better. Talk with your doctor or health care professional about the   foods you eat and the vitamins that you take. What side effects may I notice from receiving this medicine? Side effects that you should report to your doctor or health care professional as soon as possible: -allergic reactions like skin rash, itching or hives, swelling of the face, lips, or tongue -breathing problems -changes in vision -chest pain -confusion, trouble speaking  or understanding -feeling faint or lightheaded, falls -high blood pressure -muscle aches or pains -pain, swelling, warmth in the leg -rapid weight gain -severe headaches -sudden numbness or weakness of the face, arm or leg -trouble walking, dizziness, loss of balance or coordination -seizures (convulsions) -swelling of the ankles, feet, hands -unusually weak or tired Side effects that usually do not require medical attention (report to your doctor or health care professional if they continue or are bothersome): -diarrhea -fever, chills (flu-like symptoms) -headaches -nausea, vomiting -redness, stinging, or swelling at site where injected This list may not describe all possible side effects. Call your doctor for medical advice about side effects. You may report side effects to FDA at 1-800-FDA-1088. Where should I keep my medicine? Keep out of the reach of children. Store in a refrigerator between 2 and 8 degrees C (36 and 46 degrees F). Do not freeze. Do not shake. Throw away any unused portion if using a single-dose vial. Throw away any unused medicine after the expiration date. NOTE: This sheet is a summary. It may not cover all possible information. If you have questions about this medicine, talk to your doctor, pharmacist, or health care provider.    2016, [employer]vier/Gold Standard. (2008-03-28 10:23:57)  

## 2016-02-26 ENCOUNTER — Encounter: Payer: Self-pay | Admitting: Hematology & Oncology

## 2016-02-28 ENCOUNTER — Ambulatory Visit: Payer: Medicare Other

## 2016-02-28 ENCOUNTER — Other Ambulatory Visit: Payer: Medicare Other

## 2016-03-05 ENCOUNTER — Encounter (HOSPITAL_BASED_OUTPATIENT_CLINIC_OR_DEPARTMENT_OTHER): Payer: Self-pay

## 2016-03-05 ENCOUNTER — Emergency Department (HOSPITAL_BASED_OUTPATIENT_CLINIC_OR_DEPARTMENT_OTHER)
Admission: EM | Admit: 2016-03-05 | Discharge: 2016-03-05 | Disposition: A | Discharge status: Home / Self Care | Payer: Medicare Other | Attending: Emergency Medicine | Admitting: Emergency Medicine

## 2016-03-05 DIAGNOSIS — I2510 Atherosclerotic heart disease of native coronary artery without angina pectoris: Secondary | ICD-10-CM | POA: Diagnosis not present

## 2016-03-05 DIAGNOSIS — Z79899 Other long term (current) drug therapy: Secondary | ICD-10-CM | POA: Diagnosis not present

## 2016-03-05 DIAGNOSIS — E1122 Type 2 diabetes mellitus with diabetic chronic kidney disease: Secondary | ICD-10-CM | POA: Diagnosis not present

## 2016-03-05 DIAGNOSIS — I160 Hypertensive urgency: Secondary | ICD-10-CM

## 2016-03-05 DIAGNOSIS — Z87891 Personal history of nicotine dependence: Secondary | ICD-10-CM | POA: Insufficient documentation

## 2016-03-05 DIAGNOSIS — Z85828 Personal history of other malignant neoplasm of skin: Secondary | ICD-10-CM | POA: Diagnosis not present

## 2016-03-05 DIAGNOSIS — N189 Chronic kidney disease, unspecified: Secondary | ICD-10-CM | POA: Diagnosis not present

## 2016-03-05 DIAGNOSIS — Z7982 Long term (current) use of aspirin: Secondary | ICD-10-CM | POA: Diagnosis not present

## 2016-03-05 DIAGNOSIS — Z951 Presence of aortocoronary bypass graft: Secondary | ICD-10-CM | POA: Diagnosis not present

## 2016-03-05 DIAGNOSIS — I129 Hypertensive chronic kidney disease with stage 1 through stage 4 chronic kidney disease, or unspecified chronic kidney disease: Secondary | ICD-10-CM | POA: Diagnosis present

## 2016-03-05 LAB — CBC WITH DIFFERENTIAL/PLATELET
BASOS PCT: 0 %
Basophils Absolute: 0 10*3/uL (ref 0.0–0.1)
EOS ABS: 0.2 10*3/uL (ref 0.0–0.7)
EOS PCT: 4 %
HCT: 33.7 % — ABNORMAL LOW (ref 39.0–52.0)
Hemoglobin: 10.9 g/dL — ABNORMAL LOW (ref 13.0–17.0)
LYMPHS PCT: 35 %
Lymphs Abs: 2 10*3/uL (ref 0.7–4.0)
MCH: 30.4 pg (ref 26.0–34.0)
MCHC: 32.3 g/dL (ref 30.0–36.0)
MCV: 93.9 fL (ref 78.0–100.0)
MONO ABS: 0.7 10*3/uL (ref 0.1–1.0)
MONOS PCT: 12 %
NEUTROS ABS: 2.8 10*3/uL (ref 1.7–7.7)
NEUTROS PCT: 49 %
PLATELETS: 190 10*3/uL (ref 150–400)
RBC: 3.59 MIL/uL — AB (ref 4.22–5.81)
RDW: 16.5 % — ABNORMAL HIGH (ref 11.5–15.5)
WBC: 5.6 10*3/uL (ref 4.0–10.5)

## 2016-03-05 LAB — BASIC METABOLIC PANEL
ANION GAP: 7 (ref 5–15)
BUN: 25 mg/dL — ABNORMAL HIGH (ref 6–20)
CHLORIDE: 113 mmol/L — AB (ref 101–111)
CO2: 20 mmol/L — AB (ref 22–32)
Calcium: 9 mg/dL (ref 8.9–10.3)
Creatinine, Ser: 1.4 mg/dL — ABNORMAL HIGH (ref 0.61–1.24)
GFR calc non Af Amer: 44 mL/min — ABNORMAL LOW (ref >60)
GFR, EST AFRICAN AMERICAN: 51 mL/min — AB (ref >60)
GLUCOSE: 103 mg/dL — AB (ref 65–99)
POTASSIUM: 4.1 mmol/L (ref 3.5–5.1)
Sodium: 140 mmol/L (ref 135–145)

## 2016-03-05 MED ORDER — CLONIDINE HCL 0.1 MG PO TABS: 0.1000 mg | ORAL_TABLET | Freq: Two times a day (BID) | ORAL | 1 refills | Status: DC | PRN

## 2016-03-05 MED ORDER — CLONIDINE HCL 0.1 MG PO TABS
0.2000 mg | ORAL_TABLET | Freq: Once | ORAL | Status: AC
  Administered 2016-03-05: 0.2 mg via ORAL
  Filled 2016-03-05: qty 2

## 2016-03-05 NOTE — ED Notes (Signed)
ED Provider at bedside. 

## 2016-03-05 NOTE — ED Triage Notes (Signed)
Pt c/o elevated BP x today-PCP increased BP med approx 2 weeks ago-NAD-steady gait

## 2016-03-05 NOTE — ED Provider Notes (Signed)
[place] DEPT [facility] Provider Note   CSN: [id number] Arrival date & time: 03/05/16  2213  History   Chief Complaint Chief Complaint  Patient presents with  . Hypertension   HPI [name] is a 82 y.o. male.  HPI  Noted blood pressure was elevated at home to 218/77. Reports for the last several days it has been 150s/70s. Reports recent history of anemia, during which time his blood pressure medication was titrated down. His blood pressure has been increasing back to prior baseline and his blood pressure medications are now at doses noted prior to anemia. Denies headache, chest pain, shortness of breath. Currently taking Lisinopril 20mg  and Carvedilol 12.5mg  twice daily.   Past Medical History:  Diagnosis Date  . Basal cell carcinoma    skin  . Bigeminal rhythm   . Cataract   . Coronary artery disease    post bypass  . CVD (cardiovascular disease)   . Diabetes mellitus without complication ([place])   . Diverticular disease   . Easy bruising   . 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 Gout   . Hernia   . Hyperlipidemia   . Hypertension   . Iron deficiency anemia 02/04/2016  . Macular degeneration   . Microscopic colitis   . PVD (peripheral vascular disease) [facility])     Patient Active Problem List   Diagnosis Date Noted  . Iron deficiency anemia 02/04/2016  . 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 Unintentional weight loss 01/03/2016  . Pre-syncope 01/03/2016  . Orthostatic hypotension 01/03/2016  . Vitamin D deficiency 12/27/2015  . Anemia associated with stage 3 chronic renal failure 12/27/2015  . Bence Jones proteinuria 12/27/2015  . Chronic kidney disease (CKD) 12/19/2015  . Macular degeneration   . Gout   . A-fib ([place]) 01/03/2013  . ATHEROSCLEROSIS W /INT CLAUDICATION 08/03/2008  . HYPERCHOLESTEROLEMIA  IIA 08/02/2008  . Essential hypertension 08/02/2008  . CAD, ARTERY BYPASS GRAFT 08/02/2008  . BIGEMINY 08/02/2008  . CAROTID ARTERY  DISEASE 08/02/2008  . PVD 08/02/2008  . DIABETES MELLITUS, BORDERLINE 08/02/2008    Past Surgical History:  Procedure Laterality Date  . CARDIAC CATHETERIZATION  2006  . CARDIOVERSION N/A 02/22/2013   Procedure: CARDIOVERSION;  Surgeon: [name], MD;  Location: [facility] ENDOSCOPY;  Service: Cardiovascular;  Laterality: N/A;  . CARDIOVERSION N/A 11/15/2014   Procedure: CARDIOVERSION;  Surgeon: [name], MD;  Location: [facility];  Service: Cardiovascular;  Laterality: N/A;  . CAROTID ENDARTERECTOMY  2009/ 1993   left/ right  . CORONARY ARTERY BYPASS GRAFT  1999  . EYE SURGERY     eyelid repair  . INGUINAL HERNIA REPAIR  02/11/2012   Procedure: LAPAROSCOPIC BILATERAL INGUINAL HERNIA REPAIR;  Surgeon: Pedro Earls, MD;  Location: [facility] ORS;  Service: General;  Laterality: Bilateral;  . SKIN CANCER EXCISION     right ear x 3       Home Medications    Prior to Admission medications   Medication Sig Start Date End Date Taking? Authorizing Provider  allopurinol (ZYLOPRIM) 300 MG tablet Take 150 mg by mouth daily.     Historical Provider, MD  Aloe Vera 25 MG CAPS Take 2 capsules by mouth daily.    Historical Provider, MD  aspirin 81 MG tablet Take 81 mg by mouth at bedtime.     Historical Provider, MD  atorvastatin (LIPITOR) 20 MG tablet Take 20 mg by mouth at bedtime.    Historical Provider, MD  carvedilol (COREG) 12.5 MG tablet Take  1 tablet (12.5 mg total) by mouth 2 (two) times daily. 02/15/16   Renee A Kuneff, DO  cholecalciferol (VITAMIN D) 1000 units tablet Take 5,000 Units by mouth daily.    Historical Provider, MD  CHROMIUM GTF PO Take 2 capsules by mouth daily.    Historical Provider, MD  Cinnamon 500 MG TABS Take 2 tablets by mouth at bedtime.     Historical Provider, MD  Coenzyme Q10 (CO Q 10) 100 MG CAPS Take 1 capsule by mouth daily.     Historical Provider, MD  ELIQUIS 2.5 MG TABS tablet TAKE 1 TABLET TWICE A DAY 02/11/16   [name], MD  fish oil-omega-3  fatty acids 1000 MG capsule Take 1 g by mouth daily.     Historical Provider, MD  lisinopril (PRINIVIL,ZESTRIL) 20 MG tablet Take 1 tablet (20 mg total) by mouth daily. 04/04/13   [name], MD  Multiple Vitamins-Minerals (PRESERVISION AREDS PO) Take 1 tablet by mouth 2 (two) times daily.     Historical Provider, MD  nitroGLYCERIN (NITROSTAT) 0.4 MG SL tablet Place 0.4 mg under the tongue every 5 (five) minutes as needed (up to 3 doses only). For chest pain    Historical Provider, MD  OVER THE COUNTER MEDICATION Take 2 tablets by mouth every evening. GlucoCare    Historical Provider, MD  Probiotic Product (PROBIOTIC PO) Take 1 tablet by mouth daily.    Historical Provider, MD  tamsulosin (FLOMAX) 0.4 MG CAPS capsule Take 1 capsule (0.4 mg total) by mouth daily after supper. 02/20/16   Renee A Kuneff, DO  VANADYL SULFATE PO Take 1 capsule by mouth daily.    Historical Provider, MD  zinc gluconate 50 MG tablet Take 50 mg by mouth daily.     Historical Provider, MD    Family History Family History  Problem Relation Age of Onset  . Stroke Mother   . Coronary artery disease Father   . Heart disease Father   . Melanoma Sister     Social History Social History  Substance Use Topics  . Smoking status: Former Smoker    Quit date: 12/25/1961  . Smokeless tobacco: Former Systems developer    Types: Chew    Quit date: 02/03/1969  . Alcohol use No     Allergies   Penicillins   Review of Systems Review of Systems  Respiratory: Negative for shortness of breath.   Cardiovascular: Negative for chest pain and palpitations.  Neurological: Negative for weakness, light-headedness, numbness and headaches.     Physical Exam Updated Vital Signs BP (!) 218/80 (BP Location: Left Arm)   Pulse 81   Temp 98.2 F (36.8 C) (Oral)   Resp 18   Ht 5\' 8"  (1.727 m)   Wt 71.7 kg   SpO2 100%   BMI 24.02 kg/m   Physical Exam  Constitutional: He is oriented to person, place, and time. He appears  well-developed and well-nourished. No distress.  HENT:  Head: Normocephalic and atraumatic.  Cardiovascular: Normal rate and regular rhythm.   No murmur heard. Pulmonary/Chest: Effort normal. No respiratory distress. He has no wheezes.  Abdominal: Soft. Bowel sounds are normal. He exhibits no distension. There is no tenderness.  Musculoskeletal: He exhibits no edema.  Muscle strength 5/5 in upper and lower extremities.  Neurological: He is alert and oriented to person, place, and time. No cranial nerve deficit.  Skin: No rash noted.  Psychiatric: He has a normal mood and affect. His behavior is normal.  ED Treatments / Results  Labs (all labs ordered are listed, but only abnormal results are displayed) Labs Reviewed - No data to display  EKG  EKG Interpretation None      Radiology No results found.  Procedures Procedures (including critical care time)  Medications Ordered in ED Medications - No data to display   Initial Impression / Assessment and Plan / ED Course  I have reviewed the triage vital signs and the nursing notes.  Pertinent labs & imaging results that were available during my care of the patient were reviewed by me and considered in my medical decision making (see chart for details).  Clinical Course   Patient signed out to Dr. Stark Jock. BMP, CBC, EKG pending. Clonidine given.   Final Clinical Impressions(s) / ED Diagnoses   Final diagnoses:  None    New Prescriptions New Prescriptions   No medications on file     [facility], DO 03/05/16 2307    Veryl Speak, MD 03/05/16 2336

## 2016-03-05 NOTE — Discharge Instructions (Signed)
Continue your medications as before.  If your blood pressure continues to run over 761 systolic, begin taking clonidine 0.1 mg every 12 hours.  Keep a record of your blood pressures and take these with you to your next doctor's appointment. Ideally this should occur in the next week.  Return to the emergency department if you develop any new and concerning symptoms.

## 2016-03-07 ENCOUNTER — Other Ambulatory Visit (HOSPITAL_BASED_OUTPATIENT_CLINIC_OR_DEPARTMENT_OTHER): Payer: Medicare Other

## 2016-03-07 ENCOUNTER — Ambulatory Visit: Payer: Medicare Other

## 2016-03-07 ENCOUNTER — Ambulatory Visit (HOSPITAL_BASED_OUTPATIENT_CLINIC_OR_DEPARTMENT_OTHER): Payer: Medicare Other | Admitting: Hematology & Oncology

## 2016-03-07 ENCOUNTER — Ambulatory Visit: Payer: Medicare Other | Admitting: Hematology & Oncology

## 2016-03-07 ENCOUNTER — Other Ambulatory Visit: Payer: Medicare Other

## 2016-03-07 VITALS — BP 172/68 | HR 67 | Temp 97.4°F | Wt 159.4 lb

## 2016-03-07 DIAGNOSIS — D631 Anemia in chronic kidney disease: Secondary | ICD-10-CM

## 2016-03-07 DIAGNOSIS — Z23 Encounter for immunization: Secondary | ICD-10-CM

## 2016-03-07 DIAGNOSIS — N189 Chronic kidney disease, unspecified: Secondary | ICD-10-CM

## 2016-03-07 DIAGNOSIS — N183 Chronic kidney disease, stage 3 unspecified: Secondary | ICD-10-CM

## 2016-03-07 DIAGNOSIS — R634 Abnormal weight loss: Secondary | ICD-10-CM | POA: Diagnosis not present

## 2016-03-07 DIAGNOSIS — D509 Iron deficiency anemia, unspecified: Secondary | ICD-10-CM

## 2016-03-07 DIAGNOSIS — D508 Other iron deficiency anemias: Secondary | ICD-10-CM

## 2016-03-07 LAB — CBC WITH DIFFERENTIAL (CANCER CENTER ONLY)
BASO#: 0 10*3/uL (ref 0.0–0.2)
BASO%: 0.2 % (ref 0.0–2.0)
EOS%: 2.4 % (ref 0.0–7.0)
Eosinophils Absolute: 0.1 10*3/uL (ref 0.0–0.5)
HEMATOCRIT: 33.2 % — AB (ref 38.7–49.9)
HEMOGLOBIN: 10.8 g/dL — AB (ref 13.0–17.1)
LYMPH#: 1.8 10*3/uL (ref 0.9–3.3)
LYMPH%: 30 % (ref 14.0–48.0)
MCH: 30.7 pg (ref 28.0–33.4)
MCHC: 32.5 g/dL (ref 32.0–35.9)
MCV: 94 fL (ref 82–98)
MONO#: 0.7 10*3/uL (ref 0.1–0.9)
MONO%: 11.6 % (ref 0.0–13.0)
NEUT%: 55.8 % (ref 40.0–80.0)
NEUTROS ABS: 3.3 10*3/uL (ref 1.5–6.5)
Platelets: 173 10*3/uL (ref 145–400)
RBC: 3.52 10*6/uL — AB (ref 4.20–5.70)
RDW: 16.5 % — AB (ref 11.1–15.7)
WBC: 5.8 10*3/uL (ref 4.0–10.0)

## 2016-03-07 LAB — CMP (CANCER CENTER ONLY)
ALT(SGPT): 31 U/L (ref 10–47)
AST: 27 U/L (ref 11–38)
Albumin: 3.6 g/dL (ref 3.3–5.5)
Alkaline Phosphatase: 62 U/L (ref 26–84)
BUN, Bld: 22 mg/dL (ref 7–22)
CO2: 24 meq/L (ref 18–33)
Calcium: 9 mg/dL (ref 8.0–10.3)
Chloride: 107 meq/L (ref 98–108)
Creat: 1.5 mg/dL — ABNORMAL HIGH (ref 0.6–1.2)
Glucose, Bld: 106 mg/dL (ref 73–118)
Potassium: 3.9 meq/L (ref 3.3–4.7)
Sodium: 140 meq/L (ref 128–145)
Total Bilirubin: 0.9 mg/dL (ref 0.20–1.60)
Total Protein: 6.4 g/dL (ref 6.4–8.1)

## 2016-03-07 NOTE — Progress Notes (Signed)
Hematology and Oncology Follow Up Visit  [name] [id number] 03-09-1929 82 y.o. 03/07/2016   Principle Diagnosis:   Multifactorial anemia  Erythropoietin deficiency anemia  Iron deficiency anemia  Current Therapy:    IV iron as indicated  Aranesp 30 g subcutaneous as needed for hemoglobin less than 11     Interim History:  [name] is back for follow-up. He is a church member mine. We saw him on October 3. At that point time, he was not feeling all that well. He had anemia. He has some weight loss.  We first saw him, his ferritin was 72 but his iron saturation was only 18%. His serum was 47.  His erythropoietin level was only 9.5.  We will ahead and start him on iron and Aranesp. He has responded. He feels better. He has a little bit more energy.  He is in a church choir. Both he and his wife saying very nicely.  I think part of his probably iron deficiency that he has some element of bleeding from being on ELIQUIS.  His appetite is doing a little bit better.  Overall, his performance status is ECOG 2.    Medications:  Current Outpatient Prescriptions:  .  allopurinol (ZYLOPRIM) 300 MG tablet, Take 150 mg by mouth daily. , Disp: , Rfl:  .  Aloe Vera 25 MG CAPS, Take 2 capsules by mouth daily., Disp: , Rfl:  .  aspirin 81 MG tablet, Take 81 mg by mouth at bedtime. , Disp: , Rfl:  .  atorvastatin (LIPITOR) 20 MG tablet, Take 20 mg by mouth at bedtime., Disp: , Rfl:  .  carvedilol (COREG) 12.5 MG tablet, Take 1 tablet (12.5 mg total) by mouth 2 (two) times daily., Disp: , Rfl:  .  cholecalciferol (VITAMIN D) 1000 units tablet, Take 5,000 Units by mouth daily., Disp: , Rfl:  .  CHROMIUM GTF PO, Take 2 capsules by mouth daily., Disp: , Rfl:  .  Cinnamon 500 MG TABS, Take 2 tablets by mouth at bedtime. , Disp: , Rfl:  .  cloNIDine (CATAPRES) 0.1 MG tablet, Take 1 tablet (0.1 mg total) by mouth 2 (two) times daily as needed., Disp: 20 tablet, Rfl: 1 .  Coenzyme  Q10 (CO Q 10) 100 MG CAPS, Take 1 capsule by mouth daily. , Disp: , Rfl:  .  DARBEPOETIN ALFA-ALBUMIN IJ, Inject as directed., Disp: , Rfl:  .  ELIQUIS 2.5 MG TABS tablet, TAKE 1 TABLET TWICE A DAY, Disp: 180 tablet, Rfl: 1 .  fish oil-omega-3 fatty acids 1000 MG capsule, Take 1 g by mouth daily. , Disp: , Rfl:  .  lisinopril (PRINIVIL,ZESTRIL) 20 MG tablet, Take 1 tablet (20 mg total) by mouth daily., Disp: 90 tablet, Rfl: 3 .  Multiple Vitamins-Minerals (PRESERVISION AREDS PO), Take 1 tablet by mouth 2 (two) times daily. , Disp: , Rfl:  .  nitroGLYCERIN (NITROSTAT) 0.4 MG SL tablet, Place 0.4 mg under the tongue every 5 (five) minutes as needed (up to 3 doses only). For chest pain, Disp: , Rfl:  .  OVER THE COUNTER MEDICATION, Take 2 tablets by mouth every evening. GlucoCare, Disp: , Rfl:  .  Probiotic Product (PROBIOTIC PO), Take 1 tablet by mouth daily., Disp: , Rfl:  .  tamsulosin (FLOMAX) 0.4 MG CAPS capsule, Take 1 capsule (0.4 mg total) by mouth daily after supper., Disp: 90 capsule, Rfl: 1 .  VANADYL SULFATE PO, Take 1 capsule by mouth daily., Disp: , Rfl:  .  zinc gluconate 50 MG tablet, Take 50 mg by mouth daily. , Disp: , Rfl:   Allergies:  Allergies  Allergen Reactions  . Penicillins Hives    Past Medical History, Surgical history, Social history, and Family History were reviewed and updated.  Review of Systems:  As above  Physical Exam:  weight is 159 lb 6.4 oz (72.3 kg). His oral temperature is 97.4 F (36.3 C). His blood pressure is 172/68 (abnormal) and his pulse is 67.   Wt Readings from Last 3 Encounters:  03/07/16 159 lb 6.4 oz (72.3 kg)  03/05/16 158 lb (71.7 kg)  02/15/16 157 lb 8 oz (71.4 kg)      Elderly white male in no obvious distress. Head exam shows no ocular or oral lesions. There are no palpable cervical or supraclavicular lymph nodes. Lungs are clear. Cardiac exam regular rate and rhythm with an occasional extra beat. He has 1/6 systolic murmur.  Abdomen is soft. He has good bowel sounds. There is no fluid wave. There is no palpable liver or spleen tip. Back exam shows no tenderness over the spine, ribs or hips. Extremities show some trace edema in his legs. Skin exam shows no rashes, ecchymoses or petechia. Neurological exam shows no focal neurological deficits.  Lab Results  Component Value Date   WBC 5.8 03/07/2016   HGB 10.8 (L) 03/07/2016   HCT 33.2 (L) 03/07/2016   MCV 94 03/07/2016   PLT 173 03/07/2016     Chemistry      Component Value Date/Time   NA 140 03/07/2016 1309   NA 141 01/29/2016 1055   K 3.9 03/07/2016 1309   K 5.5 No visable hemolysis (H) 01/29/2016 1055   CL 107 03/07/2016 1309   CO2 24 03/07/2016 1309   CO2 20 (L) 01/29/2016 1055   BUN 22 03/07/2016 1309   BUN 34.1 (H) 01/29/2016 1055   CREATININE 1.5 (H) 03/07/2016 1309   CREATININE 2.1 (H) 01/29/2016 1055      Component Value Date/Time   CALCIUM 9.0 03/07/2016 1309   CALCIUM 8.8 01/29/2016 1055   ALKPHOS 62 03/07/2016 1309   ALKPHOS 71 01/29/2016 1055   AST 27 03/07/2016 1309   AST 33 01/29/2016 1055   ALT 31 03/07/2016 1309   ALT 38 01/29/2016 1055   BILITOT 0.90 03/07/2016 1309   BILITOT 0.40 01/29/2016 1055         Impression and Plan: [name] is a 82 year old white male with multifactorial anemia. He is responding.  For now, we t will hold off on the Aranesp. We will see what his iron stores show.  I think that we will get him back in 3 weeks. I think this would be reasonable.  I'm just glad that he is feeling better.   [name], MD 11/10/20172:51 PM's 11

## 2016-03-08 LAB — RETICULOCYTES: Reticulocyte Count: 3.7 % — ABNORMAL HIGH (ref 0.6–2.6)

## 2016-03-10 ENCOUNTER — Encounter: Payer: Self-pay | Admitting: Family Medicine

## 2016-03-10 ENCOUNTER — Other Ambulatory Visit: Payer: Self-pay | Admitting: Family Medicine

## 2016-03-10 ENCOUNTER — Ambulatory Visit (INDEPENDENT_AMBULATORY_CARE_PROVIDER_SITE_OTHER): Payer: Medicare Other | Admitting: Family Medicine

## 2016-03-10 ENCOUNTER — Telehealth: Payer: Self-pay | Admitting: *Deleted

## 2016-03-10 ENCOUNTER — Other Ambulatory Visit: Payer: Self-pay | Admitting: *Deleted

## 2016-03-10 VITALS — BP 172/64 | HR 62 | Temp 98.4°F | Resp 20 | Wt 160.0 lb

## 2016-03-10 DIAGNOSIS — I10 Essential (primary) hypertension: Secondary | ICD-10-CM | POA: Diagnosis not present

## 2016-03-10 DIAGNOSIS — I6523 Occlusion and stenosis of bilateral carotid arteries: Secondary | ICD-10-CM | POA: Diagnosis not present

## 2016-03-10 DIAGNOSIS — D509 Iron deficiency anemia, unspecified: Secondary | ICD-10-CM

## 2016-03-10 LAB — IRON AND TIBC
%SAT: 14 % — ABNORMAL LOW (ref 20–55)
Iron: 38 ug/dL — ABNORMAL LOW (ref 42–163)
TIBC: 265 ug/dL (ref 202–409)
UIBC: 227 ug/dL (ref 117–376)

## 2016-03-10 LAB — FERRITIN: Ferritin: 137 ng/ml (ref 22–316)

## 2016-03-10 MED ORDER — CARVEDILOL 12.5 MG PO TABS: 12.5000 mg | ORAL_TABLET | Freq: Two times a day (BID) | ORAL | 1 refills | Status: DC

## 2016-03-10 NOTE — Patient Instructions (Signed)
Start lisinopril 1.5 tabs in the morning. Continue coreg two times a day.  Use clonidine up to 2x a day if BP > 190/110.   Monitor the sodium in your diet.   Monitor BP 2 hours after taking medications.  Follow with a nurse visit and bring pressures in 1 week.

## 2016-03-10 NOTE — Telephone Encounter (Addendum)
Patient aware of results. Message sent to scheduler.   ----- Message from [name], MD sent at 03/10/2016 10:03 AM EST ----- Call -  Iron level is low again!!  Need 1 dose of Feraheme!!  pete

## 2016-03-10 NOTE — Progress Notes (Signed)
Patient ID: [name], male  DOB: 27-Sep-1928, 82 y.o.   MRN: [id number] Patient Care Team    Relationship Specialty Notifications Start End  [name], DO PCP - General Family Medicine  12/18/15   [name], MD Consulting Physician Ophthalmology  12/18/15   [name], MD Consulting Physician Dermatology  12/18/15   [name], MD Consulting Physician Cardiology  12/18/15   [name], MD Consulting Physician Ophthalmology  12/19/15   Laurence Spates, MD Consulting Physician Gastroenterology  12/19/15     Subjective:  [name] is a 82 y.o.  male present for elevated BP. He has had fluctuations in his BP over the last 6 months. He was tapered down on medications secondary to hypotension. He has been evaluated for fatigue and found to be anemic and has been undergoing Aranesp infusions. Since he has started feeling better, with more energy after infusions, his BP as started to climb. He recently was seen in the ED for BP > 220/89 at home, along with "jitteriness" and was provided with clonidine 1 mg BID PRN script. He states he has used the medication once in the last week after a BP of 171/60 in the morning. Patient states he usually take his Lisinopril 20 mg around noon and his coreg in the morning and then 12 hours later. His home BP readings are averaging about 998'P systolic. He denies chest pain, shortness of breath or dizziness. He reports he is feeling rather good. He endorses mild LE edema at times, but has been walking more so it could be because he is on his feet.     F/U myovue 08/28/15 scar no ischemia  Nuclear stress EF: 70%.  There was no ST segment deviation noted during stress.  Defect 1: There is a medium defect of moderate severity present in the basal inferolateral, mid inferolateral and apical lateral location.  Findings consistent with prior myocardial infarction with a small amount of peri-infarct ischemia.  This is a low risk  study.  The left ventricular ejection fraction is hyperdynamic (>65%).  Duplex 09/03/15  1-39% ICA normal vertebral flow  Normal caliber abdominal aorta. Aorto-iliac atherosclerosis. >50% bilateral common iliac artery disease, without focal stenosis.    Immunization History  Administered Date(s) Administered  . Influenza,inj,Quad PF,36+ Mos 02/07/2016  . Influenza-Unspecified 01/08/2015    Past Medical History:  Diagnosis Date  . Basal cell carcinoma    skin  . Bigeminal rhythm   . Cataract   . Coronary artery disease    post bypass  . CVD (cardiovascular disease)   . Diabetes mellitus without complication ([place])   . Diverticular disease   . Easy bruising   . 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 Gout   . Hernia   . Hyperlipidemia   . Hypertension   . Iron deficiency anemia 02/04/2016  . Macular degeneration   . Microscopic colitis   . PVD (peripheral vascular disease) (HCC)    Allergies  Allergen Reactions  . Penicillins Hives   Past Surgical History:  Procedure Laterality Date  . CARDIAC CATHETERIZATION  2006  . CARDIOVERSION N/A 02/22/2013   Procedure: CARDIOVERSION;  Surgeon: [name], MD;  Location: [facility] ENDOSCOPY;  Service: Cardiovascular;  Laterality: N/A;  . CARDIOVERSION N/A 11/15/2014   Procedure: CARDIOVERSION;  Surgeon: [name], MD;  Location: [facility];  Service: Cardiovascular;  Laterality: N/A;  . CAROTID ENDARTERECTOMY  2009/ 1993   left/ right  . CORONARY  ARTERY BYPASS GRAFT  1999  . EYE SURGERY     eyelid repair  . INGUINAL HERNIA REPAIR  02/11/2012   Procedure: LAPAROSCOPIC BILATERAL INGUINAL HERNIA REPAIR;  Surgeon: Pedro Earls, MD;  Location: [facility] ORS;  Service: General;  Laterality: Bilateral;  . SKIN CANCER EXCISION     right ear x 3   Family History  Problem Relation Age of Onset  . Stroke Mother   . Coronary artery disease Father   . Heart disease Father   . Melanoma Sister    Social History   Social  History  . Marital status: Married    Spouse name: N/A  . Number of children: N/A  . Years of education: N/A   Occupational History  . retired    Social History Main Topics  . Smoking status: Former Smoker    Quit date: 12/25/1961  . Smokeless tobacco: Former Systems developer    Types: Chew    Quit date: 02/03/1969  . Alcohol use No  . Drug use: No  . Sexual activity: No   Other Topics Concern  . Not on file   Social History Narrative   Married to [place], 2 children [name] and [name].   Some college. Retired from [employer].   Drinks caffeine, uses herbal remedies, takes a daily vitamin.   Wears his seatbelt, smoke detector at home, firearms in the home.   Wears a hearing aid.   Feels safe in her relationships.     Medication List       Accurate as of 03/10/16 11:20 AM. Always use your most recent med list.          allopurinol 300 MG tablet Commonly known as:  ZYLOPRIM Take 150 mg by mouth daily.   Aloe Vera 25 MG Caps Take 2 capsules by mouth daily.   aspirin 81 MG tablet Take 81 mg by mouth at bedtime.   atorvastatin 20 MG tablet Commonly known as:  LIPITOR Take 20 mg by mouth at bedtime.   carvedilol 12.5 MG tablet Commonly known as:  COREG Take 1 tablet (12.5 mg total) by mouth 2 (two) times daily.   cholecalciferol 1000 units tablet Commonly known as:  VITAMIN D Take 5,000 Units by mouth daily.   CHROMIUM GTF PO Take 2 capsules by mouth daily.   Cinnamon 500 MG Tabs Take 2 tablets by mouth at bedtime.   cloNIDine 0.1 MG tablet Commonly known as:  CATAPRES Take 1 tablet (0.1 mg total) by mouth 2 (two) times daily as needed.   Co Q 10 100 MG Caps Take 1 capsule by mouth daily.   DARBEPOETIN ALFA-ALBUMIN IJ Inject as directed.   ELIQUIS 2.5 MG Tabs tablet Generic drug:  apixaban TAKE 1 TABLET TWICE A DAY   fish oil-omega-3 fatty acids 1000 MG capsule Take 1 g by mouth daily.   lisinopril 20 MG tablet Commonly known as:   PRINIVIL,ZESTRIL Take 1 tablet (20 mg total) by mouth daily.   nitroGLYCERIN 0.4 MG SL tablet Commonly known as:  NITROSTAT Place 0.4 mg under the tongue every 5 (five) minutes as needed (up to 3 doses only). For chest pain   OVER THE COUNTER MEDICATION Take 2 tablets by mouth every evening. GlucoCare   PRESERVISION AREDS PO Take 1 tablet by mouth 2 (two) times daily.   PROBIOTIC PO Take 1 tablet by mouth daily.   tamsulosin 0.4 MG Caps capsule Commonly known as:  FLOMAX Take 1 capsule (0.4 mg total) by mouth  daily after supper.   VANADYL SULFATE PO Take 1 capsule by mouth daily.   zinc gluconate 50 MG tablet Take 50 mg by mouth daily.        Recent Results (from the past 2160 hour(s))  Comp Met (CMET)     Status: Abnormal   Collection Time: 12/18/15  2:59 PM  Result Value Ref Range   Sodium 139 135 - 145 mEq/L   Potassium 4.7 3.5 - 5.1 mEq/L   Chloride 112 96 - 112 mEq/L   CO2 20 19 - 32 mEq/L   Glucose, Bld 127 (H) 70 - 99 mg/dL   BUN 41 (H) 6 - 23 mg/dL   Creatinine, Ser 2.02 (H) 0.40 - 1.50 mg/dL   Total Bilirubin 0.4 0.2 - 1.2 mg/dL   Alkaline Phosphatase 60 39 - 117 U/L   AST 18 0 - 37 U/L   ALT 18 0 - 53 U/L   Total Protein 5.9 (L) 6.0 - 8.3 g/dL   Albumin 3.7 3.5 - 5.2 g/dL   Calcium 8.3 (L) 8.4 - 10.5 mg/dL   GFR 33.50 (L) >60.00 mL/min  HgB A1c     Status: None   Collection Time: 12/18/15  2:59 PM  Result Value Ref Range   Hgb A1c MFr Bld 5.7 4.6 - 6.5 %    Comment: Glycemic Control Guidelines for People with Diabetes:Non Diabetic:  <6%Goal of Therapy: <7%Additional Action Suggested:  >8%   Vitamin D (25 hydroxy)     Status: None   Collection Time: 12/18/15  2:59 PM  Result Value Ref Range   VITD 32.34 30.00 - 100.00 ng/mL  CBC w/Diff     Status: Abnormal   Collection Time: 12/18/15  2:59 PM  Result Value Ref Range   WBC 8.7 4.0 - 10.5 K/uL   RBC 3.29 (L) 4.22 - 5.81 Mil/uL   Hemoglobin 10.0 (L) 13.0 - 17.0 g/dL   HCT 30.0 (L) 39.0 - 52.0 %    MCV 91.3 78.0 - 100.0 fl   MCHC 33.3 30.0 - 36.0 g/dL   RDW 16.2 (H) 11.5 - 15.5 %   Platelets 264.0 150.0 - 400.0 K/uL   Neutrophils Relative % 62.2 43.0 - 77.0 %   Lymphocytes Relative 26.7 12.0 - 46.0 %   Monocytes Relative 8.8 3.0 - 12.0 %   Eosinophils Relative 2.0 0.0 - 5.0 %   Basophils Relative 0.3 0.0 - 3.0 %   Neutro Abs 5.4 1.4 - 7.7 K/uL   Lymphs Abs 2.3 0.7 - 4.0 K/uL   Monocytes Absolute 0.8 0.1 - 1.0 K/uL   Eosinophils Absolute 0.2 0.0 - 0.7 K/uL   Basophils Absolute 0.0 0.0 - 0.1 K/uL  TSH     Status: None   Collection Time: 12/26/15  3:33 PM  Result Value Ref Range   TSH 1.85 0.40 - 4.50 mIU/L  Basic Metabolic Panel (BMET)     Status: Abnormal   Collection Time: 12/26/15  3:33 PM  Result Value Ref Range   Sodium 141 135 - 146 mmol/L   Potassium 5.1 3.5 - 5.3 mmol/L   Chloride 114 (H) 98 - 110 mmol/L   CO2 19 (L) 20 - 31 mmol/L   Glucose, Bld 88 65 - 99 mg/dL   BUN 35 (H) 7 - 25 mg/dL   Creat 1.80 (H) 0.70 - 1.11 mg/dL    Comment:   For patients > or = 80 years of age: The upper reference limit for Creatinine is approximately 13%  higher for people identified as African-American.      Calcium 8.9 8.6 - 10.3 mg/dL  Urine Microalbumin w/creat. ratio     Status: None   Collection Time: 12/26/15  3:33 PM  Result Value Ref Range   Creatinine, Urine 127 20 - 370 mg/dL   Microalb, Ur 0.8 Not estab mg/dL   Microalb Creat Ratio 6 <30 mcg/mg creat    Comment: The ADA has defined abnormalities in albumin excretion as follows:           Category           Result                            (mcg/mg creatinine)                 Normal:    <30       Microalbuminuria:    30 - 299   Clinical albuminuria:    > or = 300   The ADA recommends that at least two of three specimens collected within a 3 - 6 month period be abnormal before considering a patient to be within a diagnostic category.     Urinalysis, Routine w reflex microscopic     Status: None   Collection  Time: 12/26/15  3:33 PM  Result Value Ref Range   Color, Urine YELLOW YELLOW    Comment: The preferred specimen for urinalysis testing is the [employer] urine collection tube. Effective 01/19/2016, [employer], a [employer], will reject any urine cup received that is not transferred into the preferred [employer] tube. Using this device, specimen components remain stable in transit up to 72 hours at ambient temperatures. Urine may be collected in a clean, unused cup and transferred to the yellow cap transfer tube. Supply order number is: [id number]. If you have any questions, please contact your [facility] Account Representative directly, or call our Customer Service Department at [phone].    APPearance CLEAR CLEAR   Specific Gravity, Urine 1.017 1.001 - 1.035   pH 5.5 5.0 - 8.0   Glucose, UA NEGATIVE NEGATIVE   Bilirubin Urine NEGATIVE NEGATIVE   Ketones, ur NEGATIVE NEGATIVE   Hgb urine dipstick NEGATIVE NEGATIVE   Protein, ur NEGATIVE NEGATIVE   Nitrite NEGATIVE NEGATIVE   Leukocytes, UA NEGATIVE NEGATIVE  PTH, Intact and Calcium     Status: None   Collection Time: 12/26/15  3:33 PM  Result Value Ref Range   PTH 45 14 - 64 pg/mL   Calcium 8.9 8.6 - 10.3 mg/dL    Comment:   Interpretive Guide:                              Intact PTH               Calcium                              ----------               ------- Normal Parathyroid           Normal                   Normal Hypoparathyroidism           Low or Low Normal  Low Hyperparathyroidism      Primary                 Normal or High           High      Secondary               High                     Normal or Low      Tertiary                High                     High Non-Parathyroid   Hypercalcemia              Low or Low Normal        High   Phosphorus     Status: None   Collection Time: 12/26/15  3:33 PM  Result Value Ref Range   Phosphorus 3.3 2.1 - 4.3  mg/dL  Magnesium     Status: None   Collection Time: 12/26/15  3:33 PM  Result Value Ref Range   Magnesium 1.6 1.5 - 2.5 mg/dL  Vitamin D 1,25 dihydroxy     Status: Abnormal   Collection Time: 12/26/15  3:33 PM  Result Value Ref Range   Vitamin D 1, 25 (OH)2 Total 11 (L) 18 - 72 pg/mL   Vitamin D3 1, 25 (OH)2 11 pg/mL   Vitamin D2 1, 25 (OH)2 <8 pg/mL    Comment: Vitamin D3, 1,25(OH)2 indicates both endogenous production and supplementation.  Vitamin D2, 1,25(OH)2 is an indicator of exogeous sources, such as diet or supplementation.  Interpretation and therapy are based on measurement of Vitamin D,1,25(OH)2, Total. This test was developed and its analytical performance characteristics have been determined by [facility], [place], [place]. It has not been cleared or approved by the FDA. This assay has been validated pursuant to the CLIA regulations and is used for clinical purposes.   Vitamin D 25 hydroxy     Status: None   Collection Time: 12/26/15  3:33 PM  Result Value Ref Range   Vit D, 25-Hydroxy 43 30 - 100 ng/mL    Comment: Vitamin D Status           25-OH Vitamin D        Deficiency                <20 ng/mL        Insufficiency         20 - 29 ng/mL        Optimal             > or = 30 ng/mL   For 25-OH Vitamin D testing on patients on D2-supplementation and patients for whom quantitation of D2 and D3 fractions is required, the [employer] 25-OH VIT D, (D2,D3), LC/MS/MS is recommended: order code [id number] (patients > 2 yrs).   Iron Binding Cap (TIBC)     Status: Abnormal   Collection Time: 12/26/15  3:33 PM  Result Value Ref Range   Iron 47 (L) 50 - 180 ug/dL   UIBC 209 125 - 400 ug/dL   TIBC 256 250 - 425 ug/dL   %SAT 18 15 - 60 %  Ferritin     Status: None   Collection Time: 12/26/15  3:33 PM  Result Value Ref Range  Ferritin 93 20 - 380 ng/mL  Protein electrophoresis, serum     Status: Abnormal   Collection Time: 12/26/15  3:33 PM   Result Value Ref Range   Total Protein, Serum Electrophoresis 6.0 (L) 6.1 - 8.1 g/dL   Albumin ELP 3.4 (L) 3.8 - 4.8 g/dL   Alpha-1-Globulin 0.4 (H) 0.2 - 0.3 g/dL   Alpha-2-Globulin 0.8 0.5 - 0.9 g/dL   Beta Globulin 0.4 0.4 - 0.6 g/dL   Beta 2 0.3 0.2 - 0.5 g/dL   Gamma Globulin 0.8 0.8 - 1.7 g/dL   Abnormal Protein Band1 NOT DET g/dL   SPE Interp. SEE NOTE     Comment: A poorly defined band of restricted protein mobility is detected in the gamma globulins. It is unlikely this may represent a monoclonal protein, however, immunofixation analysis is available if clinically indicated. Reviewed by Odis Hollingshead, MD, PhD, FCAP (Electronic Signature on File)    Abnormal Protein Band2 NOT DET g/dL   Abnormal Protein Band3 NOT DET g/dL  IFE Interpretation     Status: None   Collection Time: 12/26/15  3:33 PM  Result Value Ref Range   Immunofix Electr Int SEE NOTE     Comment: Normal pattern. No monoclonal proteins detected. Reviewed by Francis Gaines Mammarappallil MD (Electronic Signature on File)   UPEP/UIFE/Light Chains/TP, 24-Hr Ur     Status: Abnormal   Collection Time: 12/28/15  3:38 PM  Result Value Ref Range   Protein, Ur 11.2 Not Estab. mg/dL   Protein, 24H Urine 112 30 - 150 mg/24 hr   ALBUMIN, U 10.6 %   ALPHA 1 URINE 4.1 %   ALPHA-2-GLOBULIN, U 21.6 %   % BETA, Urine 22.3 %   GAMMA GLOBULIN URINE 41.4 %   M-SPIKE, % Not Observed Not Observed %   Immunofixation, Urine Comment     Comment: An apparent normal immunofixation pattern.   NOTE: Comment     Comment: Protein electrophoresis scan will follow via computer, mail, or courier delivery.    PDF .    Free Kappa Lt Chains,Ur 272.00 (H) 1.35 - 24.19 mg/L    Comment: **Results verified by repeat testing**   Free Lambda Lt Chains,Ur 17.30 (H) 0.24 - 6.66 mg/L   Kappa/Lambda Ratio,U 15.72 (H) 2.04 - 10.37  Hemoccult Cards (X3 cards)     Status: None   Collection Time: 01/04/16  9:32 AM  Result Value Ref Range    OCCULT 1 Negative Negative   OCCULT 2 Negative Negative   OCCULT 3 Negative Negative   OCCULT 4 Negative Negative   OCCULT 5 Negative Negative   Fecal Occult Blood Negative Negative  CBC with Differential [facility])     Status: Abnormal   Collection Time: 01/29/16 10:55 AM  Result Value Ref Range   WBC 6.9 4.0 - 10.0 10e3/uL   RBC 3.22 (L) 4.20 - 5.70 10e6/uL   HGB 10.0 (L) 13.0 - 17.1 g/dL   HCT 29.8 (L) 38.7 - 49.9 %   MCV 93 82 - 98 fL   MCH 31.1 28.0 - 33.4 pg   MCHC 33.6 32.0 - 35.9 g/dL   RDW 15.2 11.1 - 15.7 %   Platelets 197 145 - 400 10e3/uL   NEUT# 4.3 1.5 - 6.5 10e3/uL   LYMPH# 1.8 0.9 - 3.3 10e3/uL   MONO# 0.6 0.1 - 0.9 10e3/uL   Eosinophils Absolute 0.2 0.0 - 0.5 10e3/uL   BASO# 0.0 0.0 - 0.2 10e3/uL   NEUT% 61.9  40.0 - 80.0 %   LYMPH% 26.7 14.0 - 48.0 %   MONO% 8.0 0.0 - 13.0 %   EOS% 3.3 0.0 - 7.0 %   BASO% 0.1 0.0 - 2.0 %  Serum protein electrophoresis with reflex     Status: Abnormal   Collection Time: 01/29/16 10:55 AM  Result Value Ref Range   Total Protein 5.9 (L) 6.0 - 8.5 g/dL   Albumin 3.4 2.9 - 4.4 g/dL   Alpha 1 0.2 0.0 - 0.4 g/dL   Alpha 2 0.7 0.4 - 1.0 g/dL   Beta 0.9 0.7 - 1.3 g/dL   Gamma Globulin 0.7 0.4 - 1.8 g/dL   M-Spike, % Not Observed Not Observed g/dL   GLOBULIN, TOTAL 2.5 2.2 - 3.9 g/dL   A/G Ratio 1.4 0.7 - 1.7   Please Note: Comment     Comment: Protein electrophoresis scan will follow via computer, mail, or courier delivery.    Interpretation(See Below) Comment     Comment: The SPE pattern appears essentially unremarkable. Evidence of monoclonal protein is not apparent.    PDF .   Testosterone     Status: None   Collection Time: 01/29/16 10:55 AM  Result Value Ref Range   Testosterone, Serum 419 264 - 916 ng/dL    Comment: Adult male reference interval is based on a population of healthy nonobese males (BMI <30) between 29 and 42 years old. [place], [place] [phone]. PMID: [id number].   Erythropoietin      Status: None   Collection Time: 01/29/16 10:55 AM  Result Value Ref Range   Erythropoietin 9.5 2.6 - 18.5 mIU/mL    Comment: Siemens Immulite 2000 Immunochemiluminometric assay [facility])  Ferritin     Status: None   Collection Time: 01/29/16 10:55 AM  Result Value Ref Range   Ferritin 72 22 - 316 ng/ml  Reticulocytes     Status: None   Collection Time: 01/29/16 10:55 AM  Result Value Ref Range   Reticulocyte Count 1.7 0.6 - 2.6 %  [facility] - Smear     Status: None   Collection Time: 01/29/16 10:55 AM  Result Value Ref Range   Smear Result Smear Available   Iron and TIBC     Status: Abnormal   Collection Time: 01/29/16 10:55 AM  Result Value Ref Range   Iron 47 42 - 163 ug/dL   TIBC 256 202 - 409 ug/dL   UIBC 210 117 - 376 ug/dL   %SAT 18 (L) 20 - 55 %  Comprehensive metabolic panel     Status: Abnormal   Collection Time: 01/29/16 10:55 AM  Result Value Ref Range   Sodium 141 136 - 145 mEq/L   Potassium 5.5 No visable hemolysis (H) 3.5 - 5.1 mEq/L   Chloride 114 (H) 98 - 109 mEq/L   CO2 20 (L) 22 - 29 mEq/L   Glucose 110 70 - 140 mg/dl    Comment: Glucose reference range is for nonfasting patients. Fasting glucose reference range is 70- 100.   BUN 34.1 (H) 7.0 - 26.0 mg/dL   Creatinine 2.1 (H) 0.7 - 1.3 mg/dL   Total Bilirubin 0.40 0.20 - 1.20 mg/dL   Alkaline Phosphatase 71 40 - 150 U/L   AST 33 5 - 34 U/L   ALT 38 0 - 55 U/L   Total Protein 6.5 6.4 - 8.3 g/dL   Albumin 3.4 (L) 3.5 - 5.0 g/dL   Calcium 8.8 8.4 - 10.4 mg/dL   Anion Gap  7 3 - 11 mEq/L   EGFR 28 (L) >90 ml/min/1.73 m2    Comment: eGFR is calculated using the CKD-EPI Creatinine Equation (2009)  Hemoglobin and hematocrit, blood     Status: Abnormal   Collection Time: 02/14/16  1:31 PM  Result Value Ref Range   HGB 9.2 (L) 13.0 - 17.1 g/dL   HCT 27.6 (L) 38.7 - 35.0 %  Basic metabolic panel     Status: Abnormal   Collection Time: 03/05/16 11:10 PM  Result Value Ref Range   Sodium 140 135 - 145  mmol/L   Potassium 4.1 3.5 - 5.1 mmol/L   Chloride 113 (H) 101 - 111 mmol/L   CO2 20 (L) 22 - 32 mmol/L   Glucose, Bld 103 (H) 65 - 99 mg/dL   BUN 25 (H) 6 - 20 mg/dL   Creatinine, Ser 1.40 (H) 0.61 - 1.24 mg/dL   Calcium 9.0 8.9 - 10.3 mg/dL   GFR calc non Af Amer 44 (L) >60 mL/min   GFR calc Af Amer 51 (L) >60 mL/min    Comment: (NOTE) The eGFR has been calculated using the CKD EPI equation. This calculation has not been validated in all clinical situations. eGFR's persistently <60 mL/min signify possible Chronic Kidney Disease.    Anion gap 7 5 - 15  CBC with Differential     Status: Abnormal   Collection Time: 03/05/16 11:10 PM  Result Value Ref Range   WBC 5.6 4.0 - 10.5 K/uL   RBC 3.59 (L) 4.22 - 5.81 MIL/uL   Hemoglobin 10.9 (L) 13.0 - 17.0 g/dL   HCT 33.7 (L) 39.0 - 52.0 %   MCV 93.9 78.0 - 100.0 fL   MCH 30.4 26.0 - 34.0 pg   MCHC 32.3 30.0 - 36.0 g/dL   RDW 16.5 (H) 11.5 - 15.5 %   Platelets 190 150 - 400 K/uL   Neutrophils Relative % 49 %   Neutro Abs 2.8 1.7 - 7.7 K/uL   Lymphocytes Relative 35 %   Lymphs Abs 2.0 0.7 - 4.0 K/uL   Monocytes Relative 12 %   Monocytes Absolute 0.7 0.1 - 1.0 K/uL   Eosinophils Relative 4 %   Eosinophils Absolute 0.2 0.0 - 0.7 K/uL   Basophils Relative 0 %   Basophils Absolute 0.0 0.0 - 0.1 K/uL  CBC with Differential [facility])     Status: Abnormal   Collection Time: 03/07/16  1:09 PM  Result Value Ref Range   WBC 5.8 4.0 - 10.0 10e3/uL   RBC 3.52 (L) 4.20 - 5.70 10e6/uL   HGB 10.8 (L) 13.0 - 17.1 g/dL   HCT 33.2 (L) 38.7 - 49.9 %   MCV 94 82 - 98 fL   MCH 30.7 28.0 - 33.4 pg   MCHC 32.5 32.0 - 35.9 g/dL   RDW 16.5 (H) 11.1 - 15.7 %   Platelets 173 145 - 400 10e3/uL   NEUT# 3.3 1.5 - 6.5 10e3/uL   LYMPH# 1.8 0.9 - 3.3 10e3/uL   MONO# 0.7 0.1 - 0.9 10e3/uL   Eosinophils Absolute 0.1 0.0 - 0.5 10e3/uL   BASO# 0.0 0.0 - 0.2 10e3/uL   NEUT% 55.8 40.0 - 80.0 %   LYMPH% 30.0 14.0 - 48.0 %   MONO% 11.6 0.0 - 13.0 %    EOS% 2.4 0.0 - 7.0 %   BASO% 0.2 0.0 - 2.0 %  CMP STAT ([facility] only)     Status: Abnormal   Collection  Time: 03/07/16  1:09 PM  Result Value Ref Range   Sodium 140 128 - 145 mEq/L   Potassium 3.9 3.3 - 4.7 mEq/L   Chloride 107 98 - 108 mEq/L   CO2 24 18 - 33 mEq/L   Glucose, Bld 106 73 - 118 mg/dL   BUN, Bld 22 7 - 22 mg/dL   Creat 1.5 (H) 0.6 - 1.2 mg/dl   Total Bilirubin 0.90 0.20 - 1.60 mg/dl   Alkaline Phosphatase 62 26 - 84 U/L   AST 27 11 - 38 U/L   ALT(SGPT) 31 10 - 47 U/L   Total Protein 6.4 6.4 - 8.1 g/dL   Albumin 3.6 3.3 - 5.5 g/dL   Calcium 9.0 8.0 - 10.3 mg/dL  Ferritin     Status: None   Collection Time: 03/07/16  1:09 PM  Result Value Ref Range   Ferritin 137 22 - 316 ng/ml  Iron and TIBC     Status: Abnormal   Collection Time: 03/07/16  1:09 PM  Result Value Ref Range   Iron 38 (L) 42 - 163 ug/dL   TIBC 265 202 - 409 ug/dL   UIBC 227 117 - 376 ug/dL   %SAT 14 (L) 20 - 55 %  Reticulocytes     Status: Abnormal   Collection Time: 03/07/16  1:09 PM  Result Value Ref Range   Reticulocyte Count 3.7 (H) 0.6 - 2.6 %    No results found.   ROS: 14 pt review of systems performed and negative (unless mentioned in an HPI)  Objective: BP (!) 172/64 (BP Location: Left Arm, Patient Position: Sitting, Cuff Size: Normal)   Pulse 62   Temp 98.4 F (36.9 C)   Resp 20   Wt 160 lb (72.6 kg)   SpO2 97%   BMI 24.33 kg/m   Gen: Afebrile. No acute distress. Nontoxic in appearance, well-developed, well-nourished,  pleasant Caucasian male. HENT: AT. [place]. MMM Eyes:Pupils Equal Round Reactive to light, Extraocular movements intact,  Conjunctiva without redness, discharge or icterus. CV: RRR, no edema left LE,  No JVD. Neuro/Msk:  Normal gait. PERLA. EOMi. Alert. Oriented x3.   Psych: Normal affect, dress and demeanor. Normal speech. Normal thought content and judgment.  Assessment/plan: [name] is a 82 y.o. male present for elevated BP reading.   Essential hypertension/Elevated blood pressure reading Pt BP has been well controlled until a few weeks ago by flow sheets.  He does not appear fluid overloaded today and he states he is feeling good. He has had infusions for his anemia.  - Continue 12.5 BID carvedilol.  - Start Lisinopril 30 mg daily (1.5 of the 20 mg tab for now), tapering up slowly with his history.  - Continue Low sodium diet, record BP at least 2 hours after taking medications. Parameters given to him to call immediately if experiencing or symptoms occur.  - Goal 130-140's/60-80's. Use clonidine for emergency only > 742 systolic/ 595 diastolic - nurse appt 1 week for recheck, he is to bring in his readings from home as well. Will adjust further if needed, then otherwise f/u q 3 months until well controlled.   Electronically signed by: Howard Pouch, DO [place]

## 2016-03-11 ENCOUNTER — Encounter: Payer: Self-pay | Admitting: Hematology & Oncology

## 2016-03-13 ENCOUNTER — Ambulatory Visit: Payer: Medicare Other

## 2016-03-13 ENCOUNTER — Ambulatory Visit (HOSPITAL_BASED_OUTPATIENT_CLINIC_OR_DEPARTMENT_OTHER): Payer: Medicare Other

## 2016-03-13 ENCOUNTER — Other Ambulatory Visit: Payer: Medicare Other

## 2016-03-13 ENCOUNTER — Ambulatory Visit: Payer: Medicare Other | Admitting: Hematology & Oncology

## 2016-03-13 DIAGNOSIS — D509 Iron deficiency anemia, unspecified: Secondary | ICD-10-CM

## 2016-03-13 MED ORDER — SODIUM CHLORIDE 0.9 % IV SOLN
INTRAVENOUS | Status: DC
  Administered 2016-03-13: 14:00:00 via INTRAVENOUS

## 2016-03-13 MED ORDER — SODIUM CHLORIDE 0.9 % IV SOLN
510.0000 mg | Freq: Once | INTRAVENOUS | Status: AC
  Administered 2016-03-13: 510 mg via INTRAVENOUS
  Filled 2016-03-13: qty 17

## 2016-03-13 NOTE — Patient Instructions (Signed)
Ferumoxytol injection What is this medicine? FERUMOXYTOL is an iron complex. Iron is used to make healthy red blood cells, which carry oxygen and nutrients throughout the body. This medicine is used to treat iron deficiency anemia in people with chronic kidney disease. COMMON BRAND NAME(S): Feraheme What should I tell my health care provider before I take this medicine? They need to know if you have any of these conditions: -anemia not caused by low iron levels -high levels of iron in the blood -magnetic resonance imaging (MRI) test scheduled -an unusual or allergic reaction to iron, other medicines, foods, dyes, or preservatives -pregnant or trying to get pregnant -breast-feeding How should I use this medicine? This medicine is for injection into a vein. It is given by a health care professional in a hospital or clinic setting. Talk to your pediatrician regarding the use of this medicine in children. Special care may be needed. What if I miss a dose? It is important not to miss your dose. Call your doctor or health care professional if you are unable to keep an appointment. What may interact with this medicine? This medicine may interact with the following medications: -other iron products What should I watch for while using this medicine? Visit your doctor or healthcare professional regularly. Tell your doctor or healthcare professional if your symptoms do not start to get better or if they get worse. You may need blood work done while you are taking this medicine. You may need to follow a special diet. Talk to your doctor. Foods that contain iron include: whole grains/cereals, dried fruits, beans, or peas, leafy green vegetables, and organ meats (liver, kidney). What side effects may I notice from receiving this medicine? Side effects that you should report to your doctor or health care professional as soon as possible: -allergic reactions like skin rash, itching or hives, swelling of the  face, lips, or tongue -breathing problems -changes in blood pressure -feeling faint or lightheaded, falls -fever or chills -flushing, sweating, or hot feelings -swelling of the ankles or feet Side effects that usually do not require medical attention (report to your doctor or health care professional if they continue or are bothersome): -diarrhea -headache -nausea, vomiting -stomach pain Where should I keep my medicine? This drug is given in a hospital or clinic and will not be stored at home.  2017 [employer] (2015-05-17 12:41:49)  

## 2016-03-17 ENCOUNTER — Ambulatory Visit (INDEPENDENT_AMBULATORY_CARE_PROVIDER_SITE_OTHER): Payer: Medicare Other | Admitting: Family Medicine

## 2016-03-17 ENCOUNTER — Encounter: Payer: Self-pay | Admitting: Family Medicine

## 2016-03-17 VITALS — BP 164/82 | HR 64 | Temp 97.8°F | Resp 16 | Wt 154.8 lb

## 2016-03-17 DIAGNOSIS — I6523 Occlusion and stenosis of bilateral carotid arteries: Secondary | ICD-10-CM

## 2016-03-17 DIAGNOSIS — I10 Essential (primary) hypertension: Secondary | ICD-10-CM | POA: Diagnosis not present

## 2016-03-17 MED ORDER — LISINOPRIL 40 MG PO TABS: 40.0000 mg | ORAL_TABLET | Freq: Every day | ORAL | 1 refills | Status: DC

## 2016-03-17 NOTE — Progress Notes (Signed)
Patient ID: [name], male  DOB: Jan 10, 1929, 82 y.o.   MRN: [id number] Patient Care Team    Relationship Specialty Notifications Start End  [name], DO PCP - General Family Medicine  12/18/15   [name], MD Consulting Physician Ophthalmology  12/18/15   [name], MD Consulting Physician Dermatology  12/18/15   [name], MD Consulting Physician Cardiology  12/18/15   [name], MD Consulting Physician Ophthalmology  12/19/15   Laurence Spates, MD Consulting Physician Gastroenterology  12/19/15     Subjective:  [name] is a 82 y.o.  male present for elevated BP. He has had fluctuations in his BP over the last 6 months. He was tapered down on medications secondary to hypotension. He has been evaluated for fatigue and found to be anemic and has been undergoing Aranesp and iron infusions. Since he has started feeling better, with more energy after infusions, his BP as started to climb. He recently was seen in the ED for BP > 220/89 at home, along with "jitteriness" and was provided with clonidine 1 mg BID PRN script. He states he has used the medication once. Patient's lisinopril dose has been increased to 30 mg QD and he still has elevated BP readings.  He has continued carvedilol 12.5 mg BID. His readings fluctuate greatly with activity.   F/U myovue 08/28/15 scar no ischemia  Nuclear stress EF: 70%.  There was no ST segment deviation noted during stress.  Defect 1: There is a medium defect of moderate severity present in the basal inferolateral, mid inferolateral and apical lateral location.  Findings consistent with prior myocardial infarction with a small amount of peri-infarct ischemia.  This is a low risk study.  The left ventricular ejection fraction is hyperdynamic (>65%).  Duplex 09/03/15  1-39% ICA normal vertebral flow  Normal caliber abdominal aorta. Aorto-iliac atherosclerosis. >50% bilateral common iliac artery disease, without focal  stenosis.    Immunization History  Administered Date(s) Administered  . Influenza,inj,Quad PF,36+ Mos 02/07/2016  . Influenza-Unspecified 01/08/2015    Past Medical History:  Diagnosis Date  . Basal cell carcinoma    skin  . Bigeminal rhythm   . Cataract   . Coronary artery disease    post bypass  . CVD (cardiovascular disease)   . Diabetes mellitus without complication ([place])   . Diverticular disease   . Easy bruising   . 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 Gout   . Hernia   . Hyperlipidemia   . Hypertension   . Iron deficiency anemia 02/04/2016  . Macular degeneration   . Microscopic colitis   . PVD (peripheral vascular disease) ([facility])    Allergies  Allergen Reactions  . Penicillins Hives   Past Surgical History:  Procedure Laterality Date  . CARDIAC CATHETERIZATION  2006  . CARDIOVERSION N/A 02/22/2013   Procedure: CARDIOVERSION;  Surgeon: [name], MD;  Location: [facility] ENDOSCOPY;  Service: Cardiovascular;  Laterality: N/A;  . CARDIOVERSION N/A 11/15/2014   Procedure: CARDIOVERSION;  Surgeon: [name], MD;  Location: [place];  Service: Cardiovascular;  Laterality: N/A;  . CAROTID ENDARTERECTOMY  2009/ 1993   left/ right  . CORONARY ARTERY BYPASS GRAFT  1999  . EYE SURGERY     eyelid repair  . INGUINAL HERNIA REPAIR  02/11/2012   Procedure: LAPAROSCOPIC BILATERAL INGUINAL HERNIA REPAIR;  Surgeon: Pedro Earls, MD;  Location: [facility];  Service: General;  Laterality: Bilateral;  . SKIN CANCER  EXCISION     right ear x 3   Family History  Problem Relation Age of Onset  . Stroke Mother   . Coronary artery disease Father   . Heart disease Father   . Melanoma Sister    Social History   Social History  . Marital status: Married    Spouse name: N/A  . Number of children: N/A  . Years of education: N/A   Occupational History  . retired    Social History Main Topics  . Smoking status: Former Smoker    Quit date: 12/25/1961  .  Smokeless tobacco: Former Systems developer    Types: Chew    Quit date: 02/03/1969  . Alcohol use No  . Drug use: No  . Sexual activity: No   Other Topics Concern  . Not on file   Social History Narrative   Married to [place]ans[place], 2 children [name] and [name].   Some college. Retired from [employer].   Drinks caffeine, uses herbal remedies, takes a daily vitamin.   Wears his seatbelt, smoke detector at home, firearms in the home.   Wears a hearing aid.   Feels safe in her relationships.     Medication List       Accurate as of 03/17/16 11:30 AM. Always use your most recent med list.          allopurinol 300 MG tablet Commonly known as:  ZYLOPRIM Take 150 mg by mouth daily.   Aloe Vera 25 MG Caps Take 2 capsules by mouth daily.   aspirin 81 MG tablet Take 81 mg by mouth at bedtime.   atorvastatin 20 MG tablet Commonly known as:  LIPITOR Take 20 mg by mouth at bedtime.   carvedilol 12.5 MG tablet Commonly known as:  COREG Take 1 tablet (12.5 mg total) by mouth 2 (two) times daily.   cholecalciferol 1000 units tablet Commonly known as:  VITAMIN D Take 5,000 Units by mouth daily.   CHROMIUM GTF PO Take 2 capsules by mouth daily.   Cinnamon 500 MG Tabs Take 2 tablets by mouth at bedtime.   cloNIDine 0.1 MG tablet Commonly known as:  CATAPRES Take 1 tablet (0.1 mg total) by mouth 2 (two) times daily as needed.   Co Q 10 100 MG Caps Take 1 capsule by mouth daily.   DARBEPOETIN ALFA-ALBUMIN IJ Inject as directed.   ELIQUIS 2.5 MG Tabs tablet Generic drug:  apixaban TAKE 1 TABLET TWICE A DAY   fish oil-omega-3 fatty acids 1000 MG capsule Take 1 g by mouth daily.   lisinopril 20 MG tablet Commonly known as:  PRINIVIL,ZESTRIL Take 1 tablet (20 mg total) by mouth daily.   nitroGLYCERIN 0.4 MG SL tablet Commonly known as:  NITROSTAT Place 0.4 mg under the tongue every 5 (five) minutes as needed (up to 3 doses only). For chest pain   OVER THE COUNTER  MEDICATION Take 2 tablets by mouth every evening. GlucoCare   PRESERVISION AREDS PO Take 1 tablet by mouth 2 (two) times daily.   PROBIOTIC PO Take 1 tablet by mouth daily.   tamsulosin 0.4 MG Caps capsule Commonly known as:  FLOMAX Take 1 capsule (0.4 mg total) by mouth daily after supper.   VANADYL SULFATE PO Take 1 capsule by mouth daily.   zinc gluconate 50 MG tablet Take 50 mg by mouth daily.        Recent Results (from the past 2160 hour(s))  Comp Met (CMET)     Status:  Abnormal   Collection Time: 12/18/15  2:59 PM  Result Value Ref Range   Sodium 139 135 - 145 mEq/L   Potassium 4.7 3.5 - 5.1 mEq/L   Chloride 112 96 - 112 mEq/L   CO2 20 19 - 32 mEq/L   Glucose, Bld 127 (H) 70 - 99 mg/dL   BUN 41 (H) 6 - 23 mg/dL   Creatinine, Ser 2.02 (H) 0.40 - 1.50 mg/dL   Total Bilirubin 0.4 0.2 - 1.2 mg/dL   Alkaline Phosphatase 60 39 - 117 U/L   AST 18 0 - 37 U/L   ALT 18 0 - 53 U/L   Total Protein 5.9 (L) 6.0 - 8.3 g/dL   Albumin 3.7 3.5 - 5.2 g/dL   Calcium 8.3 (L) 8.4 - 10.5 mg/dL   GFR 33.50 (L) >60.00 mL/min  HgB A1c     Status: None   Collection Time: 12/18/15  2:59 PM  Result Value Ref Range   Hgb A1c MFr Bld 5.7 4.6 - 6.5 %    Comment: Glycemic Control Guidelines for People with Diabetes:Non Diabetic:  <6%Goal of Therapy: <7%Additional Action Suggested:  >8%   Vitamin D (25 hydroxy)     Status: None   Collection Time: 12/18/15  2:59 PM  Result Value Ref Range   VITD 32.34 30.00 - 100.00 ng/mL  CBC w/Diff     Status: Abnormal   Collection Time: 12/18/15  2:59 PM  Result Value Ref Range   WBC 8.7 4.0 - 10.5 K/uL   RBC 3.29 (L) 4.22 - 5.81 Mil/uL   Hemoglobin 10.0 (L) 13.0 - 17.0 g/dL   HCT 30.0 (L) 39.0 - 52.0 %   MCV 91.3 78.0 - 100.0 fl   MCHC 33.3 30.0 - 36.0 g/dL   RDW 16.2 (H) 11.5 - 15.5 %   Platelets 264.0 150.0 - 400.0 K/uL   Neutrophils Relative % 62.2 43.0 - 77.0 %   Lymphocytes Relative 26.7 12.0 - 46.0 %   Monocytes Relative 8.8 3.0 - 12.0 %    Eosinophils Relative 2.0 0.0 - 5.0 %   Basophils Relative 0.3 0.0 - 3.0 %   Neutro Abs 5.4 1.4 - 7.7 K/uL   Lymphs Abs 2.3 0.7 - 4.0 K/uL   Monocytes Absolute 0.8 0.1 - 1.0 K/uL   Eosinophils Absolute 0.2 0.0 - 0.7 K/uL   Basophils Absolute 0.0 0.0 - 0.1 K/uL  TSH     Status: None   Collection Time: 12/26/15  3:33 PM  Result Value Ref Range   TSH 1.85 0.40 - 4.50 mIU/L  Basic Metabolic Panel (BMET)     Status: Abnormal   Collection Time: 12/26/15  3:33 PM  Result Value Ref Range   Sodium 141 135 - 146 mmol/L   Potassium 5.1 3.5 - 5.3 mmol/L   Chloride 114 (H) 98 - 110 mmol/L   CO2 19 (L) 20 - 31 mmol/L   Glucose, Bld 88 65 - 99 mg/dL   BUN 35 (H) 7 - 25 mg/dL   Creat 1.80 (H) 0.70 - 1.11 mg/dL    Comment:   For patients > or = 80 years of age: The upper reference limit for Creatinine is approximately 13% higher for people identified as African-American.      Calcium 8.9 8.6 - 10.3 mg/dL  Urine Microalbumin w/creat. ratio     Status: None   Collection Time: 12/26/15  3:33 PM  Result Value Ref Range   Creatinine, Urine 127 20 - 370 mg/dL  Microalb, Ur 0.8 Not estab mg/dL   Microalb Creat Ratio 6 <30 mcg/mg creat    Comment: The ADA has defined abnormalities in albumin excretion as follows:           Category           Result                            (mcg/mg creatinine)                 Normal:    <30       Microalbuminuria:    30 - 299   Clinical albuminuria:    > or = 300   The ADA recommends that at least two of three specimens collected within a 3 - 6 month period be abnormal before considering a patient to be within a diagnostic category.     Urinalysis, Routine w reflex microscopic     Status: None   Collection Time: 12/26/15  3:33 PM  Result Value Ref Range   Color, Urine YELLOW YELLOW    Comment: The preferred specimen for urinalysis testing is the [employer] urine collection tube. Effective 01/19/2016, [employer], a [employer], will reject any urine cup received that is not transferred into the preferred [employer] tube. Using this device, specimen components remain stable in transit up to 72 hours at ambient temperatures. Urine may be collected in a clean, unused cup and transferred to the yellow cap transfer tube. Supply order number is: [id number]. If you have any questions, please contact your [facility] Account Representative directly, or call our Customer Service Department at [phone].    APPearance CLEAR CLEAR   Specific Gravity, Urine 1.017 1.001 - 1.035   pH 5.5 5.0 - 8.0   Glucose, UA NEGATIVE NEGATIVE   Bilirubin Urine NEGATIVE NEGATIVE   Ketones, ur NEGATIVE NEGATIVE   Hgb urine dipstick NEGATIVE NEGATIVE   Protein, ur NEGATIVE NEGATIVE   Nitrite NEGATIVE NEGATIVE   Leukocytes, UA NEGATIVE NEGATIVE  PTH, Intact and Calcium     Status: None   Collection Time: 12/26/15  3:33 PM  Result Value Ref Range   PTH 45 14 - 64 pg/mL   Calcium 8.9 8.6 - 10.3 mg/dL    Comment:   Interpretive Guide:                              Intact PTH               Calcium                              ----------               ------- Normal Parathyroid           Normal                   Normal Hypoparathyroidism           Low or Low Normal        Low Hyperparathyroidism      Primary                 Normal or High           High      Secondary  High                     Normal or Low      Tertiary                High                     High Non-Parathyroid   Hypercalcemia              Low or Low Normal        High   Phosphorus     Status: None   Collection Time: 12/26/15  3:33 PM  Result Value Ref Range   Phosphorus 3.3 2.1 - 4.3 mg/dL  Magnesium     Status: None   Collection Time: 12/26/15  3:33 PM  Result Value Ref Range   Magnesium 1.6 1.5 - 2.5 mg/dL  Vitamin D 1,25 dihydroxy     Status: Abnormal   Collection Time: 12/26/15  3:33 PM  Result Value Ref Range    Vitamin D 1, 25 (OH)2 Total 11 (L) 18 - 72 pg/mL   Vitamin D3 1, 25 (OH)2 11 pg/mL   Vitamin D2 1, 25 (OH)2 <8 pg/mL    Comment: Vitamin D3, 1,25(OH)2 indicates both endogenous production and supplementation.  Vitamin D2, 1,25(OH)2 is an indicator of exogeous sources, such as diet or supplementation.  Interpretation and therapy are based on measurement of Vitamin D,1,25(OH)2, Total. This test was developed and its analytical performance characteristics have been determined by [facility], [place], [place]. It has not been cleared or approved by the FDA. This assay has been validated pursuant to the CLIA regulations and is used for clinical purposes.   Vitamin D 25 hydroxy     Status: None   Collection Time: 12/26/15  3:33 PM  Result Value Ref Range   Vit D, 25-Hydroxy 43 30 - 100 ng/mL    Comment: Vitamin D Status           25-OH Vitamin D        Deficiency                <20 ng/mL        Insufficiency         20 - 29 ng/mL        Optimal             > or = 30 ng/mL   For 25-OH Vitamin D testing on patients on D2-supplementation and patients for whom quantitation of D2 and D3 fractions is required, the Quest[facility]ureD 25-OH VIT D, (D2,D3), LC/MS/MS is recommended: order code [id number] (patients > 2 yrs).   Iron Binding Cap (TIBC)     Status: Abnormal   Collection Time: 12/26/15  3:33 PM  Result Value Ref Range   Iron 47 (L) 50 - 180 ug/dL   UIBC 209 125 - 400 ug/dL   TIBC 256 250 - 425 ug/dL   %SAT 18 15 - 60 %  Ferritin     Status: None   Collection Time: 12/26/15  3:33 PM  Result Value Ref Range   Ferritin 93 20 - 380 ng/mL  Protein electrophoresis, serum     Status: Abnormal   Collection Time: 12/26/15  3:33 PM  Result Value Ref Range   Total Protein, Serum Electrophoresis 6.0 (L) 6.1 - 8.1 g/dL   Albumin ELP 3.4 (L) 3.8 - 4.8 g/dL   Alpha-1-Globulin 0.4 (H) 0.2 -  0.3 g/dL   Alpha-2-Globulin 0.8 0.5 - 0.9 g/dL   Beta Globulin 0.4 0.4 - 0.6 g/dL   Beta  2 0.3 0.2 - 0.5 g/dL   Gamma Globulin 0.8 0.8 - 1.7 g/dL   Abnormal Protein Band1 NOT DET g/dL   SPE Interp. SEE NOTE     Comment: A poorly defined band of restricted protein mobility is detected in the gamma globulins. It is unlikely this may represent a monoclonal protein, however, immunofixation analysis is available if clinically indicated. Reviewed by Odis Hollingshead, MD, PhD, FCAP (Electronic Signature on File)    Abnormal Protein Band2 NOT DET g/dL   Abnormal Protein Band3 NOT DET g/dL  IFE Interpretation     Status: None   Collection Time: 12/26/15  3:33 PM  Result Value Ref Range   Immunofix Electr Int SEE NOTE     Comment: Normal pattern. No monoclonal proteins detected. Reviewed by Francis Gaines Mammarappallil MD (Electronic Signature on File)   UPEP/UIFE/Light Chains/TP, 24-Hr Ur     Status: Abnormal   Collection Time: 12/28/15  3:38 PM  Result Value Ref Range   Protein, Ur 11.2 Not Estab. mg/dL   Protein, 24H Urine 112 30 - 150 mg/24 hr   ALBUMIN, U 10.6 %   ALPHA 1 URINE 4.1 %   ALPHA-2-GLOBULIN, U 21.6 %   % BETA, Urine 22.3 %   GAMMA GLOBULIN URINE 41.4 %   M-SPIKE, % Not Observed Not Observed %   Immunofixation, Urine Comment     Comment: An apparent normal immunofixation pattern.   NOTE: Comment     Comment: Protein electrophoresis scan will follow via computer, mail, or courier delivery.    PDF .    Free Kappa Lt Chains,Ur 272.00 (H) 1.35 - 24.19 mg/L    Comment: **Results verified by repeat testing**   Free Lambda Lt Chains,Ur 17.30 (H) 0.24 - 6.66 mg/L   Kappa/Lambda Ratio,U 15.72 (H) 2.04 - 10.37  Hemoccult Cards (X3 cards)     Status: None   Collection Time: 01/04/16  9:32 AM  Result Value Ref Range   OCCULT 1 Negative Negative   OCCULT 2 Negative Negative   OCCULT 3 Negative Negative   OCCULT 4 Negative Negative   OCCULT 5 Negative Negative   Fecal Occult Blood Negative Negative  CBC with Differential [facility])     Status: Abnormal    Collection Time: 01/29/16 10:55 AM  Result Value Ref Range   WBC 6.9 4.0 - 10.0 10e3/uL   RBC 3.22 (L) 4.20 - 5.70 10e6/uL   HGB 10.0 (L) 13.0 - 17.1 g/dL   HCT 29.8 (L) 38.7 - 49.9 %   MCV 93 82 - 98 fL   MCH 31.1 28.0 - 33.4 pg   MCHC 33.6 32.0 - 35.9 g/dL   RDW 15.2 11.1 - 15.7 %   Platelets 197 145 - 400 10e3/uL   NEUT# 4.3 1.5 - 6.5 10e3/uL   LYMPH# 1.8 0.9 - 3.3 10e3/uL   MONO# 0.6 0.1 - 0.9 10e3/uL   Eosinophils Absolute 0.2 0.0 - 0.5 10e3/uL   BASO# 0.0 0.0 - 0.2 10e3/uL   NEUT% 61.9 40.0 - 80.0 %   LYMPH% 26.7 14.0 - 48.0 %   MONO% 8.0 0.0 - 13.0 %   EOS% 3.3 0.0 - 7.0 %   BASO% 0.1 0.0 - 2.0 %  Serum protein electrophoresis with reflex     Status: Abnormal   Collection Time: 01/29/16 10:55 AM  Result Value  Ref Range   Total Protein 5.9 (L) 6.0 - 8.5 g/dL   Albumin 3.4 2.9 - 4.4 g/dL   Alpha 1 0.2 0.0 - 0.4 g/dL   Alpha 2 0.7 0.4 - 1.0 g/dL   Beta 0.9 0.7 - 1.3 g/dL   Gamma Globulin 0.7 0.4 - 1.8 g/dL   M-Spike, % Not Observed Not Observed g/dL   GLOBULIN, TOTAL 2.5 2.2 - 3.9 g/dL   A/G Ratio 1.4 0.7 - 1.7   Please Note: Comment     Comment: Protein electrophoresis scan will follow via computer, mail, or courier delivery.    Interpretation(See Below) Comment     Comment: The SPE pattern appears essentially unremarkable. Evidence of monoclonal protein is not apparent.    PDF .   Testosterone     Status: None   Collection Time: 01/29/16 10:55 AM  Result Value Ref Range   Testosterone, Serum 419 264 - 916 ng/dL    Comment: Adult male reference interval is based on a population of healthy nonobese males (BMI <30) between 79 and 52 years old. [place], [place] [phone]. PMID: [id number].   Erythropoietin     Status: None   Collection Time: 01/29/16 10:55 AM  Result Value Ref Range   Erythropoietin 9.5 2.6 - 18.5 mIU/mL    Comment: Siemens Immulite 2000 Immunochemiluminometric assay [facility])  Ferritin     Status: None   Collection Time: 01/29/16  10:55 AM  Result Value Ref Range   Ferritin 72 22 - 316 ng/ml  Reticulocytes     Status: None   Collection Time: 01/29/16 10:55 AM  Result Value Ref Range   Reticulocyte Count 1.7 0.6 - 2.6 %  [facility] - Smear     Status: None   Collection Time: 01/29/16 10:55 AM  Result Value Ref Range   Smear Result Smear Available   Iron and TIBC     Status: Abnormal   Collection Time: 01/29/16 10:55 AM  Result Value Ref Range   Iron 47 42 - 163 ug/dL   TIBC 256 202 - 409 ug/dL   UIBC 210 117 - 376 ug/dL   %SAT 18 (L) 20 - 55 %  Comprehensive metabolic panel     Status: Abnormal   Collection Time: 01/29/16 10:55 AM  Result Value Ref Range   Sodium 141 136 - 145 mEq/L   Potassium 5.5 No visable hemolysis (H) 3.5 - 5.1 mEq/L   Chloride 114 (H) 98 - 109 mEq/L   CO2 20 (L) 22 - 29 mEq/L   Glucose 110 70 - 140 mg/dl    Comment: Glucose reference range is for nonfasting patients. Fasting glucose reference range is 70- 100.   BUN 34.1 (H) 7.0 - 26.0 mg/dL   Creatinine 2.1 (H) 0.7 - 1.3 mg/dL   Total Bilirubin 0.40 0.20 - 1.20 mg/dL   Alkaline Phosphatase 71 40 - 150 U/L   AST 33 5 - 34 U/L   ALT 38 0 - 55 U/L   Total Protein 6.5 6.4 - 8.3 g/dL   Albumin 3.4 (L) 3.5 - 5.0 g/dL   Calcium 8.8 8.4 - 10.4 mg/dL   Anion Gap 7 3 - 11 mEq/L   EGFR 28 (L) >90 ml/min/1.73 m2    Comment: eGFR is calculated using the CKD-EPI Creatinine Equation (2009)  Hemoglobin and hematocrit, blood     Status: Abnormal   Collection Time: 02/14/16  1:31 PM  Result Value Ref Range   HGB 9.2 (L) 13.0 - 17.1  g/dL   HCT 27.6 (L) 38.7 - 63.8 %  Basic metabolic panel     Status: Abnormal   Collection Time: 03/05/16 11:10 PM  Result Value Ref Range   Sodium 140 135 - 145 mmol/L   Potassium 4.1 3.5 - 5.1 mmol/L   Chloride 113 (H) 101 - 111 mmol/L   CO2 20 (L) 22 - 32 mmol/L   Glucose, Bld 103 (H) 65 - 99 mg/dL   BUN 25 (H) 6 - 20 mg/dL   Creatinine, Ser 1.40 (H) 0.61 - 1.24 mg/dL   Calcium 9.0 8.9 - 10.3 mg/dL    GFR calc non Af Amer 44 (L) >60 mL/min   GFR calc Af Amer 51 (L) >60 mL/min    Comment: (NOTE) The eGFR has been calculated using the CKD EPI equation. This calculation has not been validated in all clinical situations. eGFR's persistently <60 mL/min signify possible Chronic Kidney Disease.    Anion gap 7 5 - 15  CBC with Differential     Status: Abnormal   Collection Time: 03/05/16 11:10 PM  Result Value Ref Range   WBC 5.6 4.0 - 10.5 K/uL   RBC 3.59 (L) 4.22 - 5.81 MIL/uL   Hemoglobin 10.9 (L) 13.0 - 17.0 g/dL   HCT 33.7 (L) 39.0 - 52.0 %   MCV 93.9 78.0 - 100.0 fL   MCH 30.4 26.0 - 34.0 pg   MCHC 32.3 30.0 - 36.0 g/dL   RDW 16.5 (H) 11.5 - 15.5 %   Platelets 190 150 - 400 K/uL   Neutrophils Relative % 49 %   Neutro Abs 2.8 1.7 - 7.7 K/uL   Lymphocytes Relative 35 %   Lymphs Abs 2.0 0.7 - 4.0 K/uL   Monocytes Relative 12 %   Monocytes Absolute 0.7 0.1 - 1.0 K/uL   Eosinophils Relative 4 %   Eosinophils Absolute 0.2 0.0 - 0.7 K/uL   Basophils Relative 0 %   Basophils Absolute 0.0 0.0 - 0.1 K/uL  CBC with Differential [facility])     Status: Abnormal   Collection Time: 03/07/16  1:09 PM  Result Value Ref Range   WBC 5.8 4.0 - 10.0 10e3/uL   RBC 3.52 (L) 4.20 - 5.70 10e6/uL   HGB 10.8 (L) 13.0 - 17.1 g/dL   HCT 33.2 (L) 38.7 - 49.9 %   MCV 94 82 - 98 fL   MCH 30.7 28.0 - 33.4 pg   MCHC 32.5 32.0 - 35.9 g/dL   RDW 16.5 (H) 11.1 - 15.7 %   Platelets 173 145 - 400 10e3/uL   NEUT# 3.3 1.5 - 6.5 10e3/uL   LYMPH# 1.8 0.9 - 3.3 10e3/uL   MONO# 0.7 0.1 - 0.9 10e3/uL   Eosinophils Absolute 0.1 0.0 - 0.5 10e3/uL   BASO# 0.0 0.0 - 0.2 10e3/uL   NEUT% 55.8 40.0 - 80.0 %   LYMPH% 30.0 14.0 - 48.0 %   MONO% 11.6 0.0 - 13.0 %   EOS% 2.4 0.0 - 7.0 %   BASO% 0.2 0.0 - 2.0 %  CMP STAT ([facility] only)     Status: Abnormal   Collection Time: 03/07/16  1:09 PM  Result Value Ref Range   Sodium 140 128 - 145 mEq/L   Potassium 3.9 3.3 - 4.7 mEq/L   Chloride  107 98 - 108 mEq/L   CO2 24 18 - 33 mEq/L   Glucose, Bld 106 73 - 118 mg/dL   BUN, Bld 22 7 -  22 mg/dL   Creat 1.5 (H) 0.6 - 1.2 mg/dl   Total Bilirubin 0.90 0.20 - 1.60 mg/dl   Alkaline Phosphatase 62 26 - 84 U/L   AST 27 11 - 38 U/L   ALT(SGPT) 31 10 - 47 U/L   Total Protein 6.4 6.4 - 8.1 g/dL   Albumin 3.6 3.3 - 5.5 g/dL   Calcium 9.0 8.0 - 10.3 mg/dL  Ferritin     Status: None   Collection Time: 03/07/16  1:09 PM  Result Value Ref Range   Ferritin 137 22 - 316 ng/ml  Iron and TIBC     Status: Abnormal   Collection Time: 03/07/16  1:09 PM  Result Value Ref Range   Iron 38 (L) 42 - 163 ug/dL   TIBC 265 202 - 409 ug/dL   UIBC 227 117 - 376 ug/dL   %SAT 14 (L) 20 - 55 %  Reticulocytes     Status: Abnormal   Collection Time: 03/07/16  1:09 PM  Result Value Ref Range   Reticulocyte Count 3.7 (H) 0.6 - 2.6 %    No results found.   ROS: 14 pt review of systems performed and negative (unless mentioned in an HPI)  Objective: BP (!) 164/82   Pulse 64   Temp 97.8 F (36.6 C) (Oral)   Resp 16   Wt 154 lb 12.8 oz (70.2 kg)   SpO2 99%   BMI 23.54 kg/m   Gen: Afebrile. No acute distress. Nontoxic in appearance, well-developed, well-nourished,  pleasant Caucasian male. HENT: AT. [place]. MMM Eyes:Pupils Equal Round Reactive to light, Extraocular movements intact,  Conjunctiva without redness, discharge or icterus. CV: RRR, no edema left LE,  No JVD. Neuro/Msk:  Normal gait. PERLA. EOMi. Alert. Oriented x3.   Psych: Normal affect, dress and demeanor. Normal speech. Normal thought content and judgment.  Assessment/plan: LESLEE HAUETER is a 82 y.o. male present for elevated BP reading.  Essential hypertension/Elevated blood pressure reading Pt BP has been well controlled until a few weeks ago by flow sheets.  He does not appear fluid overloaded today and he states he is feeling good. He has had infusions for his anemia.  - Continue 12.5 BID carvedilol.  - Start Lisinopril 40 mg  daily QD - Continue Low sodium diet, record BP at least 2 hours after taking medications. Parameters given to him to call immediately if experiencing or symptoms occur.  - Goal 130-140's/60-80's. Use clonidine for emergency only > 150 systolic/ 569 diastolic -  f/u q 3 months as long BP normal after above adjustments.   Electronically signed by: Howard Pouch, DO [place]

## 2016-03-17 NOTE — Patient Instructions (Signed)
Increase your lisinopril to 40 mg daily (this will be 2 pills of your 20 mg tabs at home). New dose of 40 mg lisinopril called in to your mail-in pharmacy (when those come take 1 pill- 40 mg).   Continue to take BP in the morning and evening and record.  Take morning BP at least 2 hours after medication and make certain you are sitting for at least 15 minutes prior.   Your bo here today was 164/82

## 2016-03-17 NOTE — Progress Notes (Signed)
Pre visit review using our clinic review tool, if applicable. No additional management support is needed unless otherwise documented below in the visit note. 

## 2016-03-28 ENCOUNTER — Encounter: Payer: Self-pay | Admitting: *Deleted

## 2016-03-28 ENCOUNTER — Ambulatory Visit: Payer: Medicare Other

## 2016-03-28 ENCOUNTER — Other Ambulatory Visit (HOSPITAL_BASED_OUTPATIENT_CLINIC_OR_DEPARTMENT_OTHER): Payer: Medicare Other

## 2016-03-28 ENCOUNTER — Ambulatory Visit (HOSPITAL_BASED_OUTPATIENT_CLINIC_OR_DEPARTMENT_OTHER): Payer: Medicare Other | Admitting: Hematology & Oncology

## 2016-03-28 VITALS — BP 153/46 | HR 60 | Temp 98.5°F | Resp 20 | Wt 156.8 lb

## 2016-03-28 DIAGNOSIS — N189 Chronic kidney disease, unspecified: Secondary | ICD-10-CM

## 2016-03-28 DIAGNOSIS — D500 Iron deficiency anemia secondary to blood loss (chronic): Secondary | ICD-10-CM

## 2016-03-28 DIAGNOSIS — D509 Iron deficiency anemia, unspecified: Secondary | ICD-10-CM | POA: Diagnosis not present

## 2016-03-28 DIAGNOSIS — D631 Anemia in chronic kidney disease: Secondary | ICD-10-CM

## 2016-03-28 LAB — CBC WITH DIFFERENTIAL (CANCER CENTER ONLY)
BASO#: 0 10*3/uL (ref 0.0–0.2)
BASO%: 0.1 % (ref 0.0–2.0)
EOS%: 0.7 % (ref 0.0–7.0)
Eosinophils Absolute: 0.1 10*3/uL (ref 0.0–0.5)
HEMATOCRIT: 38.5 % — AB (ref 38.7–49.9)
HGB: 13 g/dL (ref 13.0–17.1)
LYMPH#: 1 10*3/uL (ref 0.9–3.3)
LYMPH%: 9.3 % — ABNORMAL LOW (ref 14.0–48.0)
MCH: 30.9 pg (ref 28.0–33.4)
MCHC: 33.8 g/dL (ref 32.0–35.9)
MCV: 91 fL (ref 82–98)
MONO#: 1.6 10*3/uL — AB (ref 0.1–0.9)
MONO%: 14.6 % — ABNORMAL HIGH (ref 0.0–13.0)
NEUT#: 8.1 10*3/uL — ABNORMAL HIGH (ref 1.5–6.5)
NEUT%: 75.3 % (ref 40.0–80.0)
Platelets: 152 10*3/uL (ref 145–400)
RBC: 4.21 10*6/uL (ref 4.20–5.70)
RDW: 15 % (ref 11.1–15.7)
WBC: 10.7 10*3/uL — ABNORMAL HIGH (ref 4.0–10.0)

## 2016-03-28 LAB — IRON AND TIBC
%SAT: 10 % — ABNORMAL LOW (ref 20–55)
Iron: 22 ug/dL — ABNORMAL LOW (ref 42–163)
TIBC: 216 ug/dL (ref 202–409)
UIBC: 194 ug/dL (ref 117–376)

## 2016-03-28 LAB — COMPREHENSIVE METABOLIC PANEL
ALT: 37 U/L (ref 0–55)
AST: 23 U/L (ref 5–34)
Albumin: 3.3 g/dL — ABNORMAL LOW (ref 3.5–5.0)
Alkaline Phosphatase: 85 U/L (ref 40–150)
Anion Gap: 8 mEq/L (ref 3–11)
BUN: 34.4 mg/dL — ABNORMAL HIGH (ref 7.0–26.0)
CO2: 19 meq/L — AB (ref 22–29)
CREATININE: 1.7 mg/dL — AB (ref 0.7–1.3)
Calcium: 9.2 mg/dL (ref 8.4–10.4)
Chloride: 113 mEq/L — ABNORMAL HIGH (ref 98–109)
EGFR: 36 mL/min/{1.73_m2} — ABNORMAL LOW (ref >90)
GLUCOSE: 121 mg/dL (ref 70–140)
Potassium: 4.4 mEq/L (ref 3.5–5.1)
SODIUM: 140 meq/L (ref 136–145)
Total Bilirubin: 0.79 mg/dL (ref 0.20–1.20)
Total Protein: 6.9 g/dL (ref 6.4–8.3)

## 2016-03-28 LAB — FERRITIN: Ferritin: 452 ng/ml — ABNORMAL HIGH (ref 22–316)

## 2016-03-28 NOTE — Progress Notes (Signed)
Hematology and Oncology Follow Up Visit  [name] [id number] November 04, 1928 82 y.o. 03/28/2016   Principle Diagnosis:   Multifactorial anemia  Erythropoietin deficiency anemia  Iron deficiency anemia  Current Therapy:    IV iron as indicated  Aranesp 30 g subcutaneous as needed for hemoglobin less than 11     Interim History:  [name] is back for follow-up. He is doing so well right now. He just cut back from [facility]. He and several of our church seniors went down for several days. They saw a couple Christmas shows. They really had a good time.  We last saw him, his ferritin was 137. His iron saturation was 14%. He did get a dose of IV iron. That plus the Aranesp has really made a difference.   He has more energy. He can do yard work now. He can play golf. He really has a much better quality of life.   He has had no bleeding. He is on ELIQUIS. He is doing well with the ELIQUIS.   Overall, his performance status is ECOG 1.  Medications:  Current Outpatient Prescriptions:  .  allopurinol (ZYLOPRIM) 300 MG tablet, Take 150 mg by mouth daily. , Disp: , Rfl:  .  Aloe Vera 25 MG CAPS, Take 2 capsules by mouth daily., Disp: , Rfl:  .  aspirin 81 MG tablet, Take 81 mg by mouth at bedtime. , Disp: , Rfl:  .  atorvastatin (LIPITOR) 20 MG tablet, Take 20 mg by mouth at bedtime., Disp: , Rfl:  .  carvedilol (COREG) 12.5 MG tablet, Take 1 tablet (12.5 mg total) by mouth 2 (two) times daily., Disp: 180 tablet, Rfl: 1 .  cholecalciferol (VITAMIN D) 1000 units tablet, Take 5,000 Units by mouth daily., Disp: , Rfl:  .  CHROMIUM GTF PO, Take 2 capsules by mouth daily., Disp: , Rfl:  .  Cinnamon 500 MG TABS, Take 2 tablets by mouth at bedtime. , Disp: , Rfl:  .  cloNIDine (CATAPRES) 0.1 MG tablet, Take 1 tablet (0.1 mg total) by mouth 2 (two) times daily as needed., Disp: 20 tablet, Rfl: 1 .  Coenzyme Q10 (CO Q 10) 100 MG CAPS, Take 1 capsule by mouth daily. , Disp: , Rfl:  .   DARBEPOETIN ALFA-ALBUMIN IJ, Inject as directed., Disp: , Rfl:  .  ELIQUIS 2.5 MG TABS tablet, TAKE 1 TABLET TWICE A DAY, Disp: 180 tablet, Rfl: 1 .  fish oil-omega-3 fatty acids 1000 MG capsule, Take 1 g by mouth daily. , Disp: , Rfl:  .  lisinopril (PRINIVIL,ZESTRIL) 40 MG tablet, Take 1 tablet (40 mg total) by mouth daily., Disp: 90 tablet, Rfl: 1 .  Multiple Vitamins-Minerals (PRESERVISION AREDS PO), Take 1 tablet by mouth 2 (two) times daily. , Disp: , Rfl:  .  nitroGLYCERIN (NITROSTAT) 0.4 MG SL tablet, Place 0.4 mg under the tongue every 5 (five) minutes as needed (up to 3 doses only). For chest pain, Disp: , Rfl:  .  OVER THE COUNTER MEDICATION, Take 2 tablets by mouth every evening. GlucoCare, Disp: , Rfl:  .  Probiotic Product (PROBIOTIC PO), Take 1 tablet by mouth daily., Disp: , Rfl:  .  tamsulosin (FLOMAX) 0.4 MG CAPS capsule, Take 1 capsule (0.4 mg total) by mouth daily after supper., Disp: 90 capsule, Rfl: 1 .  VANADYL SULFATE PO, Take 1 capsule by mouth daily., Disp: , Rfl:  .  zinc gluconate 50 MG tablet, Take 50 mg by mouth daily. , Disp: ,  Rfl:   Allergies:  Allergies  Allergen Reactions  . Penicillins Hives    Past Medical History, Surgical history, Social history, and Family History were reviewed and updated.  Review of Systems:  As above  Physical Exam:  weight is 156 lb 12.8 oz (71.1 kg). His oral temperature is 98.5 F (36.9 C). His blood pressure is 153/46 (abnormal) and his pulse is 60. His respiration is 20.   Wt Readings from Last 3 Encounters:  03/28/16 156 lb 12.8 oz (71.1 kg)  03/17/16 154 lb 12.8 oz (70.2 kg)  03/10/16 160 lb (72.6 kg)      Elderly white male in no obvious distress. Head exam shows no ocular or oral lesions. There are no palpable cervical or supraclavicular lymph nodes. Lungs are clear. Cardiac exam regular rate and rhythm with an occasional extra beat. He has 1/6 systolic murmur. Abdomen is soft. He has good bowel sounds. There is no  fluid wave. There is no palpable liver or spleen tip. Back exam shows no tenderness over the spine, ribs or hips. Extremities show some trace edema in his legs. Skin exam shows no rashes, ecchymoses or petechia. Neurological exam shows no focal neurological deficits.  Lab Results  Component Value Date   WBC 10.7 (H) 03/28/2016   HGB 13.0 03/28/2016   HCT 38.5 (L) 03/28/2016   MCV 91 03/28/2016   PLT 152 03/28/2016     Chemistry      Component Value Date/Time   NA 140 03/07/2016 1309   NA 141 01/29/2016 1055   K 3.9 03/07/2016 1309   K 5.5 No visable hemolysis (H) 01/29/2016 1055   CL 107 03/07/2016 1309   CO2 24 03/07/2016 1309   CO2 20 (L) 01/29/2016 1055   BUN 22 03/07/2016 1309   BUN 34.1 (H) 01/29/2016 1055   CREATININE 1.5 (H) 03/07/2016 1309   CREATININE 2.1 (H) 01/29/2016 1055      Component Value Date/Time   CALCIUM 9.0 03/07/2016 1309   CALCIUM 8.8 01/29/2016 1055   ALKPHOS 62 03/07/2016 1309   ALKPHOS 71 01/29/2016 1055   AST 27 03/07/2016 1309   AST 33 01/29/2016 1055   ALT 31 03/07/2016 1309   ALT 38 01/29/2016 1055   BILITOT 0.90 03/07/2016 1309   BILITOT 0.40 01/29/2016 1055         Impression and Plan: [name] is a 82 year old white male with multifactorial anemia. He is responding.He is responding incredibly well. His hemoglobin is 13. I am very impressed with this.  We do not need to give him any Aranesp. I would think that his iron levels should be okay. Per IV just feels a whole lot better. His quality of life is better.  I think we get him back now in 6 weeks. I think that this would be very reasonable.  Because his blood is better, he can sing better in our church choir.  [name], MD 12/1/20179:20 AM's 11

## 2016-03-29 LAB — RETICULOCYTES: RETICULOCYTE COUNT: 0.9 % (ref 0.6–2.6)

## 2016-03-31 ENCOUNTER — Telehealth: Payer: Self-pay | Admitting: *Deleted

## 2016-03-31 ENCOUNTER — Other Ambulatory Visit: Payer: Self-pay | Admitting: *Deleted

## 2016-03-31 DIAGNOSIS — D500 Iron deficiency anemia secondary to blood loss (chronic): Secondary | ICD-10-CM

## 2016-03-31 NOTE — Telephone Encounter (Addendum)
Left message on home phone. Sent POF to scheduler.  ----- Message from [name], MD sent at 03/28/2016 12:26 PM EST ----- Call - iron level is actually low!!  We need 1 dose of Feraheme in next 1-2 week. pete

## 2016-04-03 ENCOUNTER — Encounter: Payer: Self-pay | Admitting: Cardiovascular Disease

## 2016-04-03 NOTE — Progress Notes (Signed)
Patient ID: [name], male   DOB: 1928-06-26, 82 y.o.   MRN: [id number]     CARDIOLOGY OFFICE NOTE  Date:  04/09/2016    [name] Date of Birth: 11/25/1928 Medical Record #[id number]  PCP:  Howard Pouch, DO  Cardiologist:  Johnsie Cancel    Chief Complaint  Patient presents with  . Coronary Artery Disease    History of Present Illness: [name] is a 82 y.o. male with a history of  CAD with prior CABG in 1999 - had SSCP in 2011 and required cath. All of his grafts were patent and his pain has not recurred.  CABG 1999: SVG: IM,OM1,OM2, SVG D1, SVG PDA/PLA and [place] to LAD EF was 45-50% with mild diffuse hypokinesis. His other issues include PVD, claudication, atrial fib/flutter on monitor back in 2014. Started on Eliquis. Apparently has some of his [place] labs abnormal and was asked to see hematology.  Successful [place] on  11/15/14   Last echo from 2014 with EF of 55 to 60%.   Last visit coreg added and hydralazine stopped  Home BP and HR readings fine   Goes to [employer]. Gets some exertional dyspnea. Also after working out gets "white flash" and pre syncope at times. This is usually before He takes his afternoon ACE  F/U myovue 08/28/15 scar no ischemia  Nuclear stress EF: 70%.  There was no ST segment deviation noted during stress.  Defect 1: There is a medium defect of moderate severity present in the basal inferolateral, mid inferolateral and apical lateral location.  Findings consistent with prior myocardial infarction with a small amount of peri-infarct ischemia.  This is a low risk study.  The left ventricular ejection fraction is hyperdynamic (>65%).  Duplex 09/03/15  1-39% ICA normal vertebral flow  ABI's normal 09/03/15 although toe pressures where in .4 range   Since last seen has seen Dr Raoul Pitch as primary and had anemia w/u Getting iron injections with Dr Marin Olp   Past Medical History:  Diagnosis Date  . Basal cell carcinoma    skin   . Bigeminal rhythm   . Cataract   . Coronary artery disease    post bypass  . CVD (cardiovascular disease)   . Diabetes mellitus without complication ([place])   . Diverticular disease   . Easy bruising   . 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 Gout   . Hernia   . Hyperlipidemia   . Hypertension   . Iron deficiency anemia 02/04/2016  . Macular degeneration   . Microscopic colitis   . PVD (peripheral vascular disease) ([place])     Past Surgical History:  Procedure Laterality Date  . CARDIAC CATHETERIZATION  2006  . CARDIOVERSION N/A 02/22/2013   Procedure: CARDIOVERSION;  Surgeon: [name], MD;  Location: [facility] ENDOSCOPY;  Service: Cardiovascular;  Laterality: N/A;  . CARDIOVERSION N/A 11/15/2014   Procedure: CARDIOVERSION;  Surgeon: [name], MD;  Location: [facility];  Service: Cardiovascular;  Laterality: N/A;  . CAROTID ENDARTERECTOMY  2009/ 1993   left/ right  . CORONARY ARTERY BYPASS GRAFT  1999  . EYE SURGERY     eyelid repair  . INGUINAL HERNIA REPAIR  02/11/2012   Procedure: LAPAROSCOPIC BILATERAL INGUINAL HERNIA REPAIR;  Surgeon: Pedro Earls, MD;  Location: [facility] ORS;  Service: General;  Laterality: Bilateral;  . SKIN CANCER EXCISION     right ear x 3     Medications: Current Outpatient Prescriptions  Medication Sig Dispense Refill  .  allopurinol (ZYLOPRIM) 300 MG tablet Take 150 mg by mouth daily.     . Aloe Vera 25 MG CAPS Take 2 capsules by mouth daily.    Marland Kitchen aspirin 81 MG tablet Take 81 mg by mouth at bedtime.     Marland Kitchen atorvastatin (LIPITOR) 20 MG tablet Take 20 mg by mouth at bedtime.    . carvedilol (COREG) 12.5 MG tablet Take 1 tablet (12.5 mg total) by mouth 2 (two) times daily. 180 tablet 1  . cholecalciferol (VITAMIN D) 1000 units tablet Take 5,000 Units by mouth daily.    . CHROMIUM GTF PO Take 2 capsules by mouth daily.    . Cinnamon 500 MG TABS Take 2 tablets by mouth at bedtime.     . cloNIDine (CATAPRES) 0.1 MG tablet Take 1 tablet  (0.1 mg total) by mouth 2 (two) times daily as needed. 20 tablet 1  . Coenzyme Q10 (CO Q 10) 100 MG CAPS Take 1 capsule by mouth daily.     Marland Kitchen DARBEPOETIN ALFA-ALBUMIN IJ Inject as directed.    Marland Kitchen ELIQUIS 2.5 MG TABS tablet TAKE 1 TABLET TWICE A DAY 180 tablet 1  . fish oil-omega-3 fatty acids 1000 MG capsule Take 1 g by mouth daily.     Marland Kitchen lisinopril (PRINIVIL,ZESTRIL) 40 MG tablet Take 1 tablet (40 mg total) by mouth daily. 90 tablet 1  . Multiple Vitamins-Minerals (PRESERVISION AREDS PO) Take 1 tablet by mouth 2 (two) times daily.     . nitroGLYCERIN (NITROSTAT) 0.4 MG SL tablet Place 0.4 mg under the tongue every 5 (five) minutes as needed (up to 3 doses only). For chest pain    . OVER THE COUNTER MEDICATION Take 2 tablets by mouth every evening. GlucoCare    . Probiotic Product (PROBIOTIC PO) Take 1 tablet by mouth daily.    . tamsulosin (FLOMAX) 0.4 MG CAPS capsule Take 1 capsule (0.4 mg total) by mouth daily after supper. 90 capsule 1  . VANADYL SULFATE PO Take 1 capsule by mouth daily.    Marland Kitchen zinc gluconate 50 MG tablet Take 50 mg by mouth daily.      No current facility-administered medications for this visit.     Allergies: Allergies  Allergen Reactions  . Penicillins Hives    Social History: The patient  reports that he quit smoking about 54 years ago. He quit smokeless tobacco use about 47 years ago. His smokeless tobacco use included Chew. He reports that he does not drink alcohol or use drugs.   Family History: The patient's family history includes Coronary artery disease in his father; Heart disease in his father; Melanoma in his sister; Stroke in his mother.   Review of Systems: Please see the history of present illness.   Otherwise, the review of systems is positive for dizziness, easy bruising, skipped heart beats and palpitations.   All other systems are reviewed and negative.   Physical Exam: VS:  BP (!) 160/54   Pulse 67   Ht 5\' 8"  (1.727 m)   Wt 158 lb 6.4 oz  (71.8 kg)   SpO2 98%   BMI 24.08 kg/m  .  BMI Body mass index is 24.08 kg/m.  Wt Readings from Last 3 Encounters:  04/09/16 158 lb 6.4 oz (71.8 kg)  03/28/16 156 lb 12.8 oz (71.1 kg)  03/17/16 154 lb 12.8 oz (70.2 kg)    General: Pleasant. Well developed, well nourished and in no acute distress.   HEENT: Normal.  Neck: Supple, no JVD,  bilateral  carotid bruits with CEA , or masses noted.  Cardiac: Irregular irregular rhythm. Rate is bradycardic. No murmurs, rubs, or gallops. No edema.  Respiratory:  Lungs are clear to auscultation bilaterally with normal work of breathing.  GI: Soft and nontender.  Aorta palpable with bruit Bilateral femoral bruits but popliteals plus 3  MS: No deformity or atrophy. Gait and ROM intact.  Skin: Warm and dry. Color is normal.  Neuro:  Strength and sensation are intact and no gross focal deficits noted.  Psych: Alert, appropriate and with normal affect.   LABORATORY DATA:  EKG:  EKG 2014   atrial fibrillation with slow VR - HR is 54.  12/21/14  SR ate 89  PVC  LPFB inferior T wave changes LVH  Lab Results  Component Value Date   WBC 10.7 (H) 03/28/2016   HGB 13.0 03/28/2016   HCT 38.5 (L) 03/28/2016   PLT 152 03/28/2016   GLUCOSE 121 03/28/2016   CHOL 112 06/09/2013   TRIG 69.0 06/09/2013   HDL 43.20 06/09/2013   LDLCALC 55 06/09/2013   ALT 37 03/28/2016   AST 23 03/28/2016   NA 140 03/28/2016   K 4.4 03/28/2016   CL 107 03/07/2016   CREATININE 1.7 (H) 03/28/2016   BUN 34.4 (H) 03/28/2016   CO2 19 (L) 03/28/2016   TSH 1.85 12/26/2015   INR 1.2 (H) 11/13/2014   HGBA1C 5.7 12/18/2015   MICROALBUR 0.8 12/26/2015    BNP (last 3 results) No results for input(s): BNP in the last 8760 hours.  ProBNP (last 3 results) No results for input(s): PROBNP in the last 8760 hours.   Other Studies Reviewed Today:   Assessment/Plan: 1. Afib:  Initially 2014  Recurrent with successful [facility] November 15 2014  Continue eliquis  Given age and renal  function he is on lower dose Continue beta blocker   2. HTN - BP fair - better control. Hydralazine stopped and on lisinopril and coreg   3. PVD  Medial calcinosis ABI's over 1 in  08/2015  TBI in .4 range slightly abnormal On exam he has abdominal and bilateral femoral bruits observe   4. CAD - CABG 1999 grafts patent cath 2011  myovue scar no ischemia Given age CRF and lack of ischemia no chest pain no indication for cath at this time   5. PreSyncope:  Not postural in office. Doubt low BP.  Post bilateral CEA duplex with no stenosis   Avoid diuretics for BP control Avoid NSAI's  Have adjusted dose of NOAC   6. Anemia: improved continue f/u Dr Martha Clan on iv iron   Enjoys seeing DR Raoul Pitch at [facility]   F/u with me in 6 months   Jenkins Rouge

## 2016-04-04 ENCOUNTER — Other Ambulatory Visit: Payer: Self-pay

## 2016-04-04 ENCOUNTER — Ambulatory Visit (HOSPITAL_BASED_OUTPATIENT_CLINIC_OR_DEPARTMENT_OTHER): Payer: Medicare Other

## 2016-04-04 DIAGNOSIS — D509 Iron deficiency anemia, unspecified: Secondary | ICD-10-CM

## 2016-04-04 DIAGNOSIS — D500 Iron deficiency anemia secondary to blood loss (chronic): Secondary | ICD-10-CM

## 2016-04-04 MED ORDER — SODIUM CHLORIDE 0.9 % IV SOLN
510.0000 mg | Freq: Once | INTRAVENOUS | Status: AC
  Administered 2016-04-04: 510 mg via INTRAVENOUS
  Filled 2016-04-04: qty 17

## 2016-04-04 MED ORDER — SODIUM CHLORIDE 0.9 % IV SOLN
Freq: Once | INTRAVENOUS | Status: AC
  Administered 2016-04-04: 13:00:00 via INTRAVENOUS

## 2016-04-09 ENCOUNTER — Encounter: Payer: Self-pay | Admitting: Cardiovascular Disease

## 2016-04-09 ENCOUNTER — Ambulatory Visit (INDEPENDENT_AMBULATORY_CARE_PROVIDER_SITE_OTHER): Payer: Medicare Other | Admitting: Cardiovascular Disease

## 2016-04-09 VITALS — BP 160/54 | HR 67 | Ht 68.0 in | Wt 158.4 lb

## 2016-04-09 DIAGNOSIS — I2510 Atherosclerotic heart disease of native coronary artery without angina pectoris: Secondary | ICD-10-CM | POA: Diagnosis not present

## 2016-04-09 DIAGNOSIS — I6523 Occlusion and stenosis of bilateral carotid arteries: Secondary | ICD-10-CM | POA: Diagnosis not present

## 2016-04-09 DIAGNOSIS — I2583 Coronary atherosclerosis due to lipid rich plaque: Secondary | ICD-10-CM | POA: Diagnosis not present

## 2016-04-09 NOTE — Patient Instructions (Signed)

## 2016-04-11 ENCOUNTER — Telehealth: Payer: Self-pay | Admitting: Family Medicine

## 2016-04-11 ENCOUNTER — Other Ambulatory Visit: Payer: Self-pay | Admitting: *Deleted

## 2016-04-11 MED ORDER — LISINOPRIL 40 MG PO TABS: 40.0000 mg | ORAL_TABLET | Freq: Every day | ORAL | 1 refills | Status: DC

## 2016-04-11 MED ORDER — CLONIDINE HCL 0.1 MG PO TABS: 0.1000 mg | ORAL_TABLET | Freq: Two times a day (BID) | ORAL | 1 refills | Status: DC | PRN

## 2016-04-11 MED ORDER — ALLOPURINOL 300 MG PO TABS: 150.0000 mg | ORAL_TABLET | Freq: Every day | ORAL | 3 refills | Status: DC

## 2016-04-11 NOTE — Telephone Encounter (Signed)
Lisinopril resent to pharmacy.

## 2016-04-11 NOTE — Telephone Encounter (Signed)
Refilled allopurinol and clonidine. He had a recent script for lisinopril for 90 days and a refill, so Please check on that script. He should not need refills of that medicine.

## 2016-04-11 NOTE — Telephone Encounter (Signed)
Patient calling to request 30 day refills of the following medications:  allopurinol (ZYLOPRIM) 300 MG tablet cloNIDine (CATAPRES) 0.1 MG tablet lisinopril (PRINIVIL,ZESTRIL) 40 MG tablet  Pharmacy:  [id number] - [place], [place] [phone] (Phone) [phone] (Fax)

## 2016-04-14 ENCOUNTER — Encounter: Payer: Self-pay | Admitting: *Deleted

## 2016-04-14 DIAGNOSIS — E113291 Type 2 diabetes mellitus with mild nonproliferative diabetic retinopathy without macular edema, right eye: Secondary | ICD-10-CM | POA: Diagnosis not present

## 2016-04-14 DIAGNOSIS — H353123 Nonexudative age-related macular degeneration, left eye, advanced atrophic without subfoveal involvement: Secondary | ICD-10-CM | POA: Diagnosis not present

## 2016-04-14 DIAGNOSIS — H353114 Nonexudative age-related macular degeneration, right eye, advanced atrophic with subfoveal involvement: Secondary | ICD-10-CM | POA: Diagnosis not present

## 2016-04-14 DIAGNOSIS — H52203 Unspecified astigmatism, bilateral: Secondary | ICD-10-CM | POA: Diagnosis not present

## 2016-04-14 LAB — HM DIABETES EYE EXAM

## 2016-04-28 DIAGNOSIS — N183 Chronic kidney disease, stage 3 unspecified: Secondary | ICD-10-CM

## 2016-04-28 HISTORY — DX: Chronic kidney disease, stage 3 unspecified: N18.30

## 2016-05-09 ENCOUNTER — Other Ambulatory Visit (HOSPITAL_BASED_OUTPATIENT_CLINIC_OR_DEPARTMENT_OTHER): Payer: Medicare Other

## 2016-05-09 ENCOUNTER — Ambulatory Visit: Payer: Medicare Other

## 2016-05-09 ENCOUNTER — Ambulatory Visit (HOSPITAL_BASED_OUTPATIENT_CLINIC_OR_DEPARTMENT_OTHER): Payer: Medicare Other | Admitting: Hematology & Oncology

## 2016-05-09 ENCOUNTER — Encounter: Payer: Self-pay | Admitting: *Deleted

## 2016-05-09 VITALS — BP 161/51 | HR 49 | Temp 97.7°F | Resp 18 | Wt 158.0 lb

## 2016-05-09 DIAGNOSIS — D509 Iron deficiency anemia, unspecified: Secondary | ICD-10-CM | POA: Diagnosis not present

## 2016-05-09 DIAGNOSIS — D631 Anemia in chronic kidney disease: Secondary | ICD-10-CM

## 2016-05-09 DIAGNOSIS — N189 Chronic kidney disease, unspecified: Secondary | ICD-10-CM

## 2016-05-09 DIAGNOSIS — D500 Iron deficiency anemia secondary to blood loss (chronic): Secondary | ICD-10-CM

## 2016-05-09 LAB — CBC WITH DIFFERENTIAL (CANCER CENTER ONLY)
BASO#: 0 10*3/uL (ref 0.0–0.2)
BASO%: 0.2 % (ref 0.0–2.0)
EOS ABS: 0.2 10*3/uL (ref 0.0–0.5)
EOS%: 2.4 % (ref 0.0–7.0)
HCT: 36.9 % — ABNORMAL LOW (ref 38.7–49.9)
HEMOGLOBIN: 12.1 g/dL — AB (ref 13.0–17.1)
LYMPH#: 1.7 10*3/uL (ref 0.9–3.3)
LYMPH%: 24.9 % (ref 14.0–48.0)
MCH: 30.4 pg (ref 28.0–33.4)
MCHC: 32.8 g/dL (ref 32.0–35.9)
MCV: 93 fL (ref 82–98)
MONO#: 0.7 10*3/uL (ref 0.1–0.9)
MONO%: 10.1 % (ref 0.0–13.0)
NEUT%: 62.4 % (ref 40.0–80.0)
NEUTROS ABS: 4.1 10*3/uL (ref 1.5–6.5)
Platelets: 152 10*3/uL (ref 145–400)
RBC: 3.98 10*6/uL — AB (ref 4.20–5.70)
RDW: 15.1 % (ref 11.1–15.7)
WBC: 6.6 10*3/uL (ref 4.0–10.0)

## 2016-05-09 LAB — IRON AND TIBC
%SAT: 39 % (ref 20–55)
IRON: 92 ug/dL (ref 42–163)
TIBC: 235 ug/dL (ref 202–409)
UIBC: 143 ug/dL (ref 117–376)

## 2016-05-09 LAB — FERRITIN: FERRITIN: 806 ng/mL — AB (ref 22–316)

## 2016-05-09 MED ORDER — DARBEPOETIN ALFA 300 MCG/0.6ML IJ SOSY
PREFILLED_SYRINGE | INTRAMUSCULAR | Status: AC
  Filled 2016-05-09: qty 0.6

## 2016-05-09 NOTE — Progress Notes (Signed)
Hematology and Oncology Follow Up Visit  [name] [id number] [birth year] 83 y.o. 05/09/2016   Principle Diagnosis:   Multifactorial anemia  Erythropoietin deficiency anemia  Iron deficiency anemia  Current Therapy:    IV iron as indicated  Aranesp 300 g subcutaneous as needed for hemoglobin less than 11     Interim History:  [name] is back for follow-up. He is doing so well right now. Get a nice Christmas. He had no problems over Christmas. He has more energy. He can do yard work now. He can play golf. He really has a much better quality of life.   He has had no bleeding. He is on ELIQUIS. He is doing well with the ELIQUIS.   Overall, his performance status is ECOG 1.  Medications:  Current Outpatient Prescriptions:  .  allopurinol (ZYLOPRIM) 300 MG tablet, Take 0.5 tablets (150 mg total) by mouth daily., Disp: 45 tablet, Rfl: 3 .  Aloe Vera 25 MG CAPS, Take 2 capsules by mouth daily., Disp: , Rfl:  .  aspirin 81 MG tablet, Take 81 mg by mouth at bedtime. , Disp: , Rfl:  .  atorvastatin (LIPITOR) 20 MG tablet, Take 20 mg by mouth at bedtime., Disp: , Rfl:  .  carvedilol (COREG) 12.5 MG tablet, Take 1 tablet (12.5 mg total) by mouth 2 (two) times daily., Disp: 180 tablet, Rfl: 1 .  cholecalciferol (VITAMIN D) 1000 units tablet, Take 5,000 Units by mouth daily., Disp: , Rfl:  .  CHROMIUM GTF PO, Take 2 capsules by mouth daily., Disp: , Rfl:  .  Cinnamon 500 MG TABS, Take 2 tablets by mouth at bedtime. , Disp: , Rfl:  .  cloNIDine (CATAPRES) 0.1 MG tablet, Take 1 tablet (0.1 mg total) by mouth 2 (two) times daily as needed., Disp: 60 tablet, Rfl: 1 .  Coenzyme Q10 (CO Q 10) 100 MG CAPS, Take 1 capsule by mouth daily. , Disp: , Rfl:  .  DARBEPOETIN ALFA-ALBUMIN IJ, Inject as directed., Disp: , Rfl:  .  ELIQUIS 2.5 MG TABS tablet, TAKE 1 TABLET TWICE A DAY, Disp: 180 tablet, Rfl: 1 .  fish oil-omega-3 fatty acids 1000 MG capsule, Take 1 g by mouth daily. , Disp: ,  Rfl:  .  lisinopril (PRINIVIL,ZESTRIL) 40 MG tablet, Take 1 tablet (40 mg total) by mouth daily., Disp: 90 tablet, Rfl: 1 .  Multiple Vitamins-Minerals (PRESERVISION AREDS PO), Take 1 tablet by mouth 2 (two) times daily. , Disp: , Rfl:  .  nitroGLYCERIN (NITROSTAT) 0.4 MG SL tablet, Place 0.4 mg under the tongue every 5 (five) minutes as needed (up to 3 doses only). For chest pain, Disp: , Rfl:  .  OVER THE COUNTER MEDICATION, Take 2 tablets by mouth every evening. GlucoCare, Disp: , Rfl:  .  Probiotic Product (PROBIOTIC PO), Take 1 tablet by mouth daily., Disp: , Rfl:  .  tamsulosin (FLOMAX) 0.4 MG CAPS capsule, Take 1 capsule (0.4 mg total) by mouth daily after supper., Disp: 90 capsule, Rfl: 1 .  VANADYL SULFATE PO, Take 1 capsule by mouth daily., Disp: , Rfl:  .  zinc gluconate 50 MG tablet, Take 50 mg by mouth daily. , Disp: , Rfl:   Allergies:  Allergies  Allergen Reactions  . Penicillins Hives    Past Medical History, Surgical history, Social history, and Family History were reviewed and updated.  Review of Systems:  As above  Physical Exam:  weight is 158 lb (71.7 kg). His oral temperature  is 97.7 F (36.5 C). His blood pressure is 161/51 (abnormal) and his pulse is 49 (abnormal). His respiration is 18 and oxygen saturation is 100%.   Wt Readings from Last 3 Encounters:  05/09/16 158 lb (71.7 kg)  04/09/16 158 lb 6.4 oz (71.8 kg)  03/28/16 156 lb 12.8 oz (71.1 kg)      Elderly white male in no obvious distress. Head exam shows no ocular or oral lesions. There are no palpable cervical or supraclavicular lymph nodes. Lungs are clear. Cardiac exam regular rate and rhythm with an occasional extra beat. He has 1/6 systolic murmur. Abdomen is soft. He has good bowel sounds. There is no fluid wave. There is no palpable liver or spleen tip. Back exam shows no tenderness over the spine, ribs or hips. Extremities show some trace edema in his legs. Skin exam shows no rashes, ecchymoses  or petechia. Neurological exam shows no focal neurological deficits.  Lab Results  Component Value Date   WBC 6.6 05/09/2016   HGB 12.1 (L) 05/09/2016   HCT 36.9 (L) 05/09/2016   MCV 93 05/09/2016   PLT 152 05/09/2016     Chemistry      Component Value Date/Time   NA 140 03/28/2016 0853   K 4.4 03/28/2016 0853   CL 107 03/07/2016 1309   CO2 19 (L) 03/28/2016 0853   BUN 34.4 (H) 03/28/2016 0853   CREATININE 1.7 (H) 03/28/2016 0853      Component Value Date/Time   CALCIUM 9.2 03/28/2016 0853   ALKPHOS 85 03/28/2016 0853   AST 23 03/28/2016 0853   ALT 37 03/28/2016 0853   BILITOT 0.79 03/28/2016 0853         Impression and Plan: [name] is a 83 year old white male with multifactorial anemia. He is responding.He is responding incredibly well. His hemoglobin is 12.1.  We do not need to give him any Aranesp. I would think that his iron levels should be okay.   I am happy that he feels a whole lot better. His quality of life is better.  I think we get him back now in 4 weeks. I think that this would be very reasonable.  Because his blood is better, he can sing better in our church choir.  William Napoleon, MD 1/12/201811:29 AM's 11

## 2016-05-10 LAB — RETICULOCYTES: Reticulocyte Count: 1.1 % (ref 0.6–2.6)

## 2016-05-19 DIAGNOSIS — L853 Xerosis cutis: Secondary | ICD-10-CM | POA: Diagnosis not present

## 2016-05-19 DIAGNOSIS — D0461 Carcinoma in situ of skin of right upper limb, including shoulder: Secondary | ICD-10-CM | POA: Diagnosis not present

## 2016-05-19 DIAGNOSIS — D485 Neoplasm of uncertain behavior of skin: Secondary | ICD-10-CM | POA: Diagnosis not present

## 2016-05-19 DIAGNOSIS — L570 Actinic keratosis: Secondary | ICD-10-CM | POA: Diagnosis not present

## 2016-05-19 DIAGNOSIS — Z85828 Personal history of other malignant neoplasm of skin: Secondary | ICD-10-CM | POA: Diagnosis not present

## 2016-05-19 DIAGNOSIS — D1801 Hemangioma of skin and subcutaneous tissue: Secondary | ICD-10-CM | POA: Diagnosis not present

## 2016-05-19 DIAGNOSIS — L821 Other seborrheic keratosis: Secondary | ICD-10-CM | POA: Diagnosis not present

## 2016-05-26 ENCOUNTER — Other Ambulatory Visit: Payer: Self-pay | Admitting: *Deleted

## 2016-05-26 MED ORDER — ATORVASTATIN CALCIUM 20 MG PO TABS: 20.0000 mg | ORAL_TABLET | Freq: Every day | ORAL | 0 refills | Status: DC

## 2016-05-26 NOTE — Telephone Encounter (Signed)
lipitor refilled

## 2016-06-06 ENCOUNTER — Other Ambulatory Visit: Payer: Self-pay | Admitting: Family Medicine

## 2016-06-12 ENCOUNTER — Ambulatory Visit: Payer: Medicare Other

## 2016-06-12 ENCOUNTER — Other Ambulatory Visit (HOSPITAL_BASED_OUTPATIENT_CLINIC_OR_DEPARTMENT_OTHER): Payer: Medicare Other

## 2016-06-12 ENCOUNTER — Encounter: Payer: Self-pay | Admitting: *Deleted

## 2016-06-12 ENCOUNTER — Ambulatory Visit (HOSPITAL_BASED_OUTPATIENT_CLINIC_OR_DEPARTMENT_OTHER): Payer: Medicare Other | Admitting: Hematology & Oncology

## 2016-06-12 VITALS — BP 153/60 | HR 63 | Temp 97.4°F | Resp 17 | Wt 155.0 lb

## 2016-06-12 DIAGNOSIS — D631 Anemia in chronic kidney disease: Secondary | ICD-10-CM | POA: Diagnosis not present

## 2016-06-12 DIAGNOSIS — D500 Iron deficiency anemia secondary to blood loss (chronic): Secondary | ICD-10-CM

## 2016-06-12 DIAGNOSIS — D509 Iron deficiency anemia, unspecified: Secondary | ICD-10-CM

## 2016-06-12 DIAGNOSIS — N189 Chronic kidney disease, unspecified: Secondary | ICD-10-CM

## 2016-06-12 LAB — IRON AND TIBC
%SAT: 46 % (ref 20–55)
IRON: 110 ug/dL (ref 42–163)
TIBC: 236 ug/dL (ref 202–409)
UIBC: 127 ug/dL (ref 117–376)

## 2016-06-12 LAB — CBC WITH DIFFERENTIAL (CANCER CENTER ONLY)
BASO#: 0 10*3/uL (ref 0.0–0.2)
BASO%: 0.2 % (ref 0.0–2.0)
EOS%: 2 % (ref 0.0–7.0)
Eosinophils Absolute: 0.1 10*3/uL (ref 0.0–0.5)
HCT: 35.5 % — ABNORMAL LOW (ref 38.7–49.9)
HGB: 12.1 g/dL — ABNORMAL LOW (ref 13.0–17.1)
LYMPH#: 2.1 10*3/uL (ref 0.9–3.3)
LYMPH%: 32.6 % (ref 14.0–48.0)
MCH: 30.6 pg (ref 28.0–33.4)
MCHC: 34.1 g/dL (ref 32.0–35.9)
MCV: 90 fL (ref 82–98)
MONO#: 0.7 10*3/uL (ref 0.1–0.9)
MONO%: 10.1 % (ref 0.0–13.0)
NEUT#: 3.6 10*3/uL (ref 1.5–6.5)
NEUT%: 55.1 % (ref 40.0–80.0)
PLATELETS: 165 10*3/uL (ref 145–400)
RBC: 3.96 10*6/uL — ABNORMAL LOW (ref 4.20–5.70)
RDW: 15.7 % (ref 11.1–15.7)
WBC: 6.5 10*3/uL (ref 4.0–10.0)

## 2016-06-12 LAB — FERRITIN: Ferritin: 760 ng/ml — ABNORMAL HIGH (ref 22–316)

## 2016-06-12 NOTE — Progress Notes (Signed)
Hematology and Oncology Follow Up Visit  [name] [id number] 12/07/1928 83 y.o. 06/12/2016   Principle Diagnosis:   Multifactorial anemia  Erythropoietin deficiency anemia  Iron deficiency anemia  Current Therapy:    IV iron as indicated  Aranesp 300 g subcutaneous as needed for hemoglobin less than 11     Interim History:  [name] is back for follow-up. He is doing so well right now.  He had no problems since we last saw him.  His last iron studies back in July showed a ferritin of 806 with iron saturation of 39%.  His last dose of iron was back in early December.  He has more energy. He can do yard work now. He can play golf. He really has a much better quality of life.   He has had no bleeding. He is on ELIQUIS. He is doing well with the ELIQUIS.   Overall, his performance status is ECOG 1.  Medications:  Current Outpatient Prescriptions:  .  allopurinol (ZYLOPRIM) 300 MG tablet, Take 0.5 tablets (150 mg total) by mouth daily., Disp: 45 tablet, Rfl: 3 .  Aloe Vera 25 MG CAPS, Take 2 capsules by mouth daily., Disp: , Rfl:  .  aspirin 81 MG tablet, Take 81 mg by mouth at bedtime. , Disp: , Rfl:  .  atorvastatin (LIPITOR) 20 MG tablet, Take 1 tablet (20 mg total) by mouth at bedtime., Disp: 90 tablet, Rfl: 0 .  carvedilol (COREG) 12.5 MG tablet, Take 1 tablet (12.5 mg total) by mouth 2 (two) times daily., Disp: 180 tablet, Rfl: 1 .  cholecalciferol (VITAMIN D) 1000 units tablet, Take 5,000 Units by mouth daily., Disp: , Rfl:  .  CHROMIUM GTF PO, Take 2 capsules by mouth daily., Disp: , Rfl:  .  Cinnamon 500 MG TABS, Take 2 tablets by mouth at bedtime. , Disp: , Rfl:  .  cloNIDine (CATAPRES) 0.1 MG tablet, TAKE 1 TABLET BY MOUTH TWICE A DAY AS NEEDED, Disp: 60 tablet, Rfl: 0 .  Coenzyme Q10 (CO Q 10) 100 MG CAPS, Take 1 capsule by mouth daily. , Disp: , Rfl:  .  DARBEPOETIN ALFA-ALBUMIN IJ, Inject as directed., Disp: , Rfl:  .  ELIQUIS 2.5 MG TABS tablet,  TAKE 1 TABLET TWICE A DAY, Disp: 180 tablet, Rfl: 1 .  fish oil-omega-3 fatty acids 1000 MG capsule, Take 1 g by mouth daily. , Disp: , Rfl:  .  lisinopril (PRINIVIL,ZESTRIL) 40 MG tablet, Take 1 tablet (40 mg total) by mouth daily., Disp: 90 tablet, Rfl: 1 .  Multiple Vitamins-Minerals (PRESERVISION AREDS PO), Take 1 tablet by mouth 2 (two) times daily. , Disp: , Rfl:  .  nitroGLYCERIN (NITROSTAT) 0.4 MG SL tablet, Place 0.4 mg under the tongue every 5 (five) minutes as needed (up to 3 doses only). For chest pain, Disp: , Rfl:  .  OVER THE COUNTER MEDICATION, Take 2 tablets by mouth every evening. GlucoCare, Disp: , Rfl:  .  Probiotic Product (PROBIOTIC PO), Take 1 tablet by mouth daily., Disp: , Rfl:  .  tamsulosin (FLOMAX) 0.4 MG CAPS capsule, Take 1 capsule (0.4 mg total) by mouth daily after supper., Disp: 90 capsule, Rfl: 1 .  VANADYL SULFATE PO, Take 1 capsule by mouth daily., Disp: , Rfl:  .  zinc gluconate 50 MG tablet, Take 50 mg by mouth daily. , Disp: , Rfl:   Allergies:  Allergies  Allergen Reactions  . Penicillins Hives    Past Medical History, Surgical  history, Social history, and Family History were reviewed and updated.  Review of Systems:  As above  Physical Exam:  weight is 155 lb (70.3 kg). His oral temperature is 97.4 F (36.3 C). His blood pressure is 153/60 (abnormal) and his pulse is 63. His respiration is 17 and oxygen saturation is 100%.   Wt Readings from Last 3 Encounters:  06/12/16 155 lb (70.3 kg)  05/09/16 158 lb (71.7 kg)  04/09/16 158 lb 6.4 oz (71.8 kg)      Elderly white male in no obvious distress. Head exam shows no ocular or oral lesions. There are no palpable cervical or supraclavicular lymph nodes. Lungs are clear. Cardiac exam regular rate and rhythm with an occasional extra beat. He has 1/6 systolic murmur. Abdomen is soft. He has good bowel sounds. There is no fluid wave. There is no palpable liver or spleen tip. Back exam shows no  tenderness over the spine, ribs or hips. Extremities show some trace edema in his legs. Skin exam shows no rashes, ecchymoses or petechia. Neurological exam shows no focal neurological deficits.  Lab Results  Component Value Date   WBC 6.5 06/12/2016   HGB 12.1 (L) 06/12/2016   HCT 35.5 (L) 06/12/2016   MCV 90 06/12/2016   PLT 165 06/12/2016     Chemistry      Component Value Date/Time   NA 140 03/28/2016 0853   K 4.4 03/28/2016 0853   CL 107 03/07/2016 1309   CO2 19 (L) 03/28/2016 0853   BUN 34.4 (H) 03/28/2016 0853   CREATININE 1.7 (H) 03/28/2016 0853      Component Value Date/Time   CALCIUM 9.2 03/28/2016 0853   ALKPHOS 85 03/28/2016 0853   AST 23 03/28/2016 0853   ALT 37 03/28/2016 0853   BILITOT 0.79 03/28/2016 0853         Impression and Plan: [name] is a 83 year old white male with multifactorial anemia. He is responding.He is responding incredibly well. His hemoglobin is 12.1.  We do not need to give him any Aranesp. I would think that his iron levels should be okay.   I am happy that he feels a whole lot better. His quality of life is better.  I think we get him back now in 3 months. I think that this would be very reasonable.  Because his blood is better, he can sing better in our church choir.  [name], MD 2/15/201811:47 AM's 11

## 2016-06-13 ENCOUNTER — Telehealth: Payer: Self-pay | Admitting: *Deleted

## 2016-06-13 LAB — RETICULOCYTES: Reticulocyte Count: 1.1 % (ref 0.6–2.6)

## 2016-06-13 NOTE — Telephone Encounter (Addendum)
Message left on home voice mail  ----- Message from [name], MD sent at 06/12/2016  5:29 PM EST ----- Call - iron level is ok!!  pete

## 2016-06-18 ENCOUNTER — Encounter: Payer: Self-pay | Admitting: Family Medicine

## 2016-06-18 ENCOUNTER — Ambulatory Visit (INDEPENDENT_AMBULATORY_CARE_PROVIDER_SITE_OTHER): Payer: Medicare Other | Admitting: Family Medicine

## 2016-06-18 VITALS — BP 153/72 | HR 58 | Temp 97.7°F | Resp 20 | Ht 68.0 in | Wt 154.2 lb

## 2016-06-18 DIAGNOSIS — N183 Chronic kidney disease, stage 3 unspecified: Secondary | ICD-10-CM

## 2016-06-18 DIAGNOSIS — I10 Essential (primary) hypertension: Secondary | ICD-10-CM | POA: Diagnosis not present

## 2016-06-18 MED ORDER — CLONIDINE HCL 0.1 MG PO TABS: 0.1000 mg | ORAL_TABLET | Freq: Two times a day (BID) | ORAL | 1 refills | Status: DC | PRN

## 2016-06-18 NOTE — Patient Instructions (Addendum)
Your BP is fair.  Continue to monitor about 2 hours after morning medicines and if consistently above the 150 or 160 on the top then go ahead and take a morning clonidine. If you start this and it works for you, yo may need to schedule the clonidine every 12 hours.   Continue the low salt diet and exercise.   I am glad you are feeling better.

## 2016-06-18 NOTE — Progress Notes (Signed)
[name] , [birth year], 83 y.o., male MRN: [id number] Patient Care Team    Relationship Specialty Notifications Start End  [name], DO PCP - General Family Medicine  12/18/15   [name], MD Consulting Physician Ophthalmology  12/18/15   [name], MD Consulting Physician Dermatology  12/18/15   [name], MD Consulting Physician Cardiology  12/18/15   [name], MD Consulting Physician Ophthalmology  12/19/15   Laurence Spates, MD Consulting Physician Gastroenterology  12/19/15     CC: HTN follow up Subjective:  Pt reports compliance with lisinopril 40 mg QD, Coreg 12.5 BID and using clonidine 0.1 mg at night. . Blood pressures ranges at home 124-163/47-75 at home, most pressures after medications taken are 992-426 systolic.  Patient denies chest pain, shortness of breath or lower extremity edema. Pt on eliquis renally dose. CBC/BMP < 3 months ago and monitored by nephrology and hematology. Iron deficiency anemia improved, still has mild anemia of chronic disease, but he is much improved. He is feeling well.  Diet: low salt Exercise: doing well, exercising routinely.  RF: CKD3, HLD, CAD w/ CABG (1999), cardiac cath 2011 2/2 CP (all grafts normal), PVD, Afib    Allergies  Allergen Reactions  . Penicillins Hives   Social History  Substance Use Topics  . Smoking status: Former Smoker    Quit date: 12/25/1961  . Smokeless tobacco: Former Systems developer    Types: Chew    Quit date: 02/03/1969  . Alcohol use No   Past Medical History:  Diagnosis Date  . Basal cell carcinoma    skin  . Bigeminal rhythm   . Cataract   . Coronary artery disease    post bypass  . CVD (cardiovascular disease)   . Diabetes mellitus without complication ([place])   . Diverticular disease   . Easy bruising   . 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 Gout   . Hernia   . Hyperlipidemia   . Hypertension   . Iron deficiency anemia 02/04/2016  . Macular degeneration   .  Microscopic colitis   . PVD (peripheral vascular disease) ([place])    Past Surgical History:  Procedure Laterality Date  . CARDIAC CATHETERIZATION  2006  . CARDIOVERSION N/A 02/22/2013   Procedure: CARDIOVERSION;  Surgeon: [name], MD;  [facility] ENDOSCOPY;  Service: Cardiovascular;  Laterality: N/A;  . CARDIOVERSION N/A 11/15/2014   Procedure: CARDIOVERSION;  Surgeon: [name], MD;  Location: [facility];  Service: Cardiovascular;  Laterality: N/A;  . CAROTID ENDARTERECTOMY  2009/ 1993   left/ right  . CORONARY ARTERY BYPASS GRAFT  1999  . EYE SURGERY     eyelid repair  . INGUINAL HERNIA REPAIR  02/11/2012   Procedure: LAPAROSCOPIC BILATERAL INGUINAL HERNIA REPAIR;  Surgeon: Pedro Earls, MD;  Location: [facility] ORS;  Service: General;  Laterality: Bilateral;  . SKIN CANCER EXCISION     right ear x 3   Family History  Problem Relation Age of Onset  . Stroke Mother   . Coronary artery disease Father   . Heart disease Father   . Melanoma Sister    Allergies as of 06/18/2016      Reactions   Penicillins Hives      Medication List       Accurate as of 06/18/16 10:02 AM. Always use your most recent med list.          allopurinol 300 MG tablet Commonly known as:  ZYLOPRIM Take 0.5 tablets (  150 mg total) by mouth daily.   Aloe Vera 25 MG Caps Take 2 capsules by mouth daily.   aspirin 81 MG tablet Take 81 mg by mouth at bedtime.   atorvastatin 20 MG tablet Commonly known as:  LIPITOR Take 1 tablet (20 mg total) by mouth at bedtime.   carvedilol 12.5 MG tablet Commonly known as:  COREG Take 1 tablet (12.5 mg total) by mouth 2 (two) times daily.   cholecalciferol 1000 units tablet Commonly known as:  VITAMIN D Take 5,000 Units by mouth daily.   CHROMIUM GTF PO Take 2 capsules by mouth daily.   Cinnamon 500 MG Tabs Take 2 tablets by mouth at bedtime.   cloNIDine 0.1 MG tablet Commonly known as:  CATAPRES TAKE 1 TABLET BY MOUTH TWICE A DAY AS  NEEDED   Co Q 10 100 MG Caps Take 1 capsule by mouth daily.   DARBEPOETIN ALFA-ALBUMIN IJ Inject as directed.   ELIQUIS 2.5 MG Tabs tablet Generic drug:  apixaban TAKE 1 TABLET TWICE A DAY   fish oil-omega-3 fatty acids 1000 MG capsule Take 1 g by mouth daily.   lisinopril 40 MG tablet Commonly known as:  PRINIVIL,ZESTRIL Take 1 tablet (40 mg total) by mouth daily.   nitroGLYCERIN 0.4 MG SL tablet Commonly known as:  NITROSTAT Place 0.4 mg under the tongue every 5 (five) minutes as needed (up to 3 doses only). For chest pain   OVER THE COUNTER MEDICATION Take 2 tablets by mouth every evening. GlucoCare   PRESERVISION AREDS PO Take 1 tablet by mouth 2 (two) times daily.   PROBIOTIC PO Take 1 tablet by mouth daily.   tamsulosin 0.4 MG Caps capsule Commonly known as:  FLOMAX Take 1 capsule (0.4 mg total) by mouth daily after supper.   VANADYL SULFATE PO Take 1 capsule by mouth daily.   zinc gluconate 50 MG tablet Take 50 mg by mouth daily.       No results found for this or any previous visit (from the past 24 hour(s)). No results found.   ROS: Negative, with the exception of above mentioned in HPI   Objective:  BP (!) 153/72 (BP Location: Right Arm, Patient Position: Sitting, Cuff Size: Normal)   Pulse (!) 58   Temp 97.7 F (36.5 C)   Resp 20   Ht 5\' 8"  (1.727 m)   Wt 154 lb 4 oz (70 kg)   SpO2 98%   BMI 23.45 kg/m  Body mass index is 23.45 kg/m. Gen: Afebrile. No acute distress. Nontoxic in appearance, well developed, well nourished.  HENT: AT. [place]. MMM Eyes:Pupils Equal Round Reactive to light, Extraocular movements intact,  Conjunctiva without redness, discharge or icterus. CV: RRR, no edema Chest: CTAB, no wheeze or crackles. Good air movement, normal resp effort.  Abd: Soft.  NTND. BS present Neuro: Normal gait. PERLA. EOMi. Alert. Oriented x3  Psych: Normal affect, dress and demeanor. Normal speech. Normal thought content and  judgment.  Assessment/Plan: [name] is a 83 y.o. male present for OV for  Essential hypertension - BP fair, mildly above goal by his home readings, but some in normal range also. avg about 518-841 systolic on home readings after medicines taken.  - given his age, h/o hypotension and his is feeling much better do not want to continue to add more medications to his regimen at this time.  - discussed him to monitor BP at home, at least 2 hours after morning medicines while resting for  at least 10 minutes. If he consistently sees elevated BP above 160 sys, then he can add a morning clonidine to his regimen.  - continue low salt diet and exercise.  - Continue 12.5 BID carvedilol.  - Start Lisinopril 40 mg daily QD - he is scheduled for lab draw for his nephrologist appt this week, will follow their labs instead of double drawing.  -  f/u q 6 months as long BP normal after above adjustments. Attempting to alternate appts every 3 months with Dr. Johnsie Cancel and here, so he is being seen every 3 months between the two offices.   electronically signed by:  Howard Pouch, DO  [place]

## 2016-06-23 DIAGNOSIS — N183 Chronic kidney disease, stage 3 (moderate): Secondary | ICD-10-CM | POA: Diagnosis not present

## 2016-06-23 LAB — CBC AND DIFFERENTIAL: HEMOGLOBIN: 11.7 g/dL — AB (ref 13.5–17.5)

## 2016-07-02 ENCOUNTER — Telehealth: Payer: Self-pay | Admitting: Family Medicine

## 2016-07-02 NOTE — Telephone Encounter (Signed)
Patient walked in. He needs lancets & strips for his meter which is a [employer] No Code sent to [employer]. In the past it has been sent to a company that is no longer in business. Any questions call patient at [phone].

## 2016-07-03 MED ORDER — PRODIGY SAFETY LANCETS 26G MISC: 1 refills | Status: DC

## 2016-07-03 MED ORDER — GLUCOSE BLOOD VI STRP: ORAL_STRIP | 12 refills | Status: DC

## 2016-07-03 NOTE — Telephone Encounter (Signed)
Glucose strips and lancets sent to patient requested pharmacy.

## 2016-07-24 DIAGNOSIS — I10 Essential (primary) hypertension: Secondary | ICD-10-CM | POA: Diagnosis not present

## 2016-07-24 DIAGNOSIS — N183 Chronic kidney disease, stage 3 (moderate): Secondary | ICD-10-CM | POA: Diagnosis not present

## 2016-07-24 DIAGNOSIS — I4891 Unspecified atrial fibrillation: Secondary | ICD-10-CM | POA: Diagnosis not present

## 2016-07-24 LAB — BASIC METABOLIC PANEL
BUN: 34 mg/dL — AB (ref 4–21)
CREATININE: 1.8 mg/dL — AB (ref <=1.3)
Glucose: 113 mg/dL
Potassium: 5.3 mmol/L (ref 3.4–5.3)
Sodium: 136 mmol/L — AB (ref 137–147)

## 2016-08-05 ENCOUNTER — Encounter: Payer: Self-pay | Admitting: *Deleted

## 2016-08-12 ENCOUNTER — Other Ambulatory Visit: Payer: Self-pay | Admitting: Family Medicine

## 2016-08-12 ENCOUNTER — Other Ambulatory Visit: Payer: Self-pay | Admitting: Cardiovascular Disease

## 2016-08-12 NOTE — Telephone Encounter (Signed)
Age 83 Wt 70 kg 06/18/2016 Saw Dr Johnsie Cancel 10/10/2015 07/24/2016 SrCr 1.8 06/23/2016 Hgb 11.7  Refill done for Eliquis 2.5mg  q 12 hours as requested

## 2016-08-18 ENCOUNTER — Other Ambulatory Visit: Payer: Self-pay | Admitting: *Deleted

## 2016-08-18 MED ORDER — TAMSULOSIN HCL 0.4 MG PO CAPS: 0.4000 mg | ORAL_CAPSULE | Freq: Every day | ORAL | 1 refills | Status: DC

## 2016-09-10 DIAGNOSIS — H353123 Nonexudative age-related macular degeneration, left eye, advanced atrophic without subfoveal involvement: Secondary | ICD-10-CM | POA: Diagnosis not present

## 2016-09-10 DIAGNOSIS — H353114 Nonexudative age-related macular degeneration, right eye, advanced atrophic with subfoveal involvement: Secondary | ICD-10-CM | POA: Diagnosis not present

## 2016-09-12 ENCOUNTER — Encounter: Payer: Self-pay | Admitting: Family Medicine

## 2016-09-12 ENCOUNTER — Ambulatory Visit (INDEPENDENT_AMBULATORY_CARE_PROVIDER_SITE_OTHER): Payer: Medicare Other | Admitting: Family Medicine

## 2016-09-12 VITALS — BP 162/54 | HR 62 | Temp 98.0°F | Resp 20 | Wt 153.2 lb

## 2016-09-12 DIAGNOSIS — J0100 Acute maxillary sinusitis, unspecified: Secondary | ICD-10-CM | POA: Diagnosis not present

## 2016-09-12 MED ORDER — DOXYCYCLINE HYCLATE 100 MG PO TABS: 100.0000 mg | ORAL_TABLET | Freq: Two times a day (BID) | ORAL | 0 refills | Status: DC

## 2016-09-12 NOTE — Patient Instructions (Addendum)
Start doxycyline every 12 hours for 10 days with a little food. Tried to avoid calcium/milk around the time of taking medicine.  Start a Claritin daily.  Continue mucinex. Rest, hydrate.   Stop flonase.

## 2016-09-12 NOTE — Progress Notes (Signed)
[name] , Dec 31, 1928, 83 y.o., male MRN: [id number] Patient Care Team    Relationship Specialty Notifications Start End  William Hillock, DO PCP - General Family Medicine  12/18/15   William Drought, MD Consulting Physician Ophthalmology  12/18/15   William Miner, MD Consulting Physician Dermatology  12/18/15   William Hector, MD Consulting Physician Cardiology  12/18/15   [name], MD Consulting Physician Ophthalmology  12/19/15   Laurence Spates, MD Consulting Physician Gastroenterology  12/19/15     Chief Complaint  Patient presents with  . Sinusitis    having nose bleeds     Subjective: Pt presents for an OV with complaints of sinus pressure of 2 weeks duration.  Associated symptoms include left sided sinus pain, bloody nose today, congestion and rhinorrhea. He denies nausea, vomit, diarrhea, shortness of breath or weakness.  Pt has tried mucinex and flonase to ease their symptoms.   Depression screen [facility] 2/9 12/18/2015  Decreased Interest 0  Down, Depressed, Hopeless 0  PHQ - 2 Score 0    Allergies  Allergen Reactions  . Penicillins Hives   Social History  Substance Use Topics  . Smoking status: Former Smoker    Quit date: 12/25/1961  . Smokeless tobacco: Former Systems developer    Types: Chew    Quit date: 02/03/1969  . Alcohol use No   Past Medical History:  Diagnosis Date  . Basal cell carcinoma    skin  . Bigeminal rhythm   . Cataract   . Coronary artery disease    post bypass  . CVD (cardiovascular disease)   . Diabetes mellitus without complication ([place])   . Diverticular disease   . Easy bruising   . 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 Gout   . Hernia   . Hyperlipidemia   . Hypertension   . Iron deficiency anemia 02/04/2016  . Macular degeneration   . Microscopic colitis   . PVD (peripheral vascular disease) ([place])    Past Surgical History:  Procedure Laterality Date  . CARDIAC CATHETERIZATION  2006  . CARDIOVERSION N/A 02/22/2013   Procedure: CARDIOVERSION;  Surgeon: William Hector, MD;  Location: [facility] ENDOSCOPY;  Service: Cardiovascular;  Laterality: N/A;  . CARDIOVERSION N/A 11/15/2014   Procedure: CARDIOVERSION;  Surgeon: William Hector, MD;  Location: [place];  Service: Cardiovascular;  Laterality: N/A;  . CAROTID ENDARTERECTOMY  2009/ 1993   left/ right  . CORONARY ARTERY BYPASS GRAFT  1999  . EYE SURGERY     eyelid repair  . INGUINAL HERNIA REPAIR  02/11/2012   Procedure: LAPAROSCOPIC BILATERAL INGUINAL HERNIA REPAIR;  Surgeon: Pedro Earls, MD;  Location: [facility];  Service: General;  Laterality: Bilateral;  . SKIN CANCER EXCISION     right ear x 3   Family History  Problem Relation Age of Onset  . Stroke Mother   . Coronary artery disease Father   . Heart disease Father   . Melanoma Sister    Allergies as of 09/12/2016      Reactions   Penicillins Hives      Medication List       Accurate as of 09/12/16  2:52 PM. Always use your most recent med list.          allopurinol 300 MG tablet Commonly known as:  ZYLOPRIM Take 0.5 tablets (150 mg total) by mouth daily.   Aloe Vera 25 MG Caps Take 2 capsules by mouth daily.   aspirin 81 MG tablet  Take 81 mg by mouth at bedtime.   atorvastatin 20 MG tablet Commonly known as:  LIPITOR TAKE 1 TABLET AT BEDTIME   carvedilol 12.5 MG tablet Commonly known as:  COREG TAKE 1 TABLET TWICE A DAY   cholecalciferol 1000 units tablet Commonly known as:  VITAMIN D Take 5,000 Units by mouth daily.   CHROMIUM GTF PO Take 2 capsules by mouth daily.   Cinnamon 500 MG Tabs Take 2 tablets by mouth at bedtime.   cloNIDine 0.1 MG tablet Commonly known as:  CATAPRES Take 1 tablet (0.1 mg total) by mouth 2 (two) times daily as needed.   Co Q 10 100 MG Caps Take 1 capsule by mouth daily.   DARBEPOETIN ALFA-ALBUMIN IJ Inject as directed.   ELIQUIS 2.5 MG Tabs tablet Generic drug:  apixaban TAKE 1 TABLET TWICE A DAY   fish oil-omega-3 fatty acids  1000 MG capsule Take 1 g by mouth daily.   glucose blood test strip Commonly known as:  PRODIGY NO CODING BLOOD GLUC Use as instructed once daily   lisinopril 40 MG tablet Commonly known as:  PRINIVIL,ZESTRIL Take 1 tablet (40 mg total) by mouth daily.   nitroGLYCERIN 0.4 MG SL tablet Commonly known as:  NITROSTAT Place 0.4 mg under the tongue every 5 (five) minutes as needed (up to 3 doses only). For chest pain   PRESERVISION AREDS PO Take 1 tablet by mouth 2 (two) times daily.   PROBIOTIC PO Take 1 tablet by mouth daily.   PRODIGY SAFETY LANCETS 26G Misc Check fasting glucose once daily   tamsulosin 0.4 MG Caps capsule Commonly known as:  FLOMAX Take 1 capsule (0.4 mg total) by mouth daily after supper.   VANADYL SULFATE PO Take 1 capsule by mouth daily.   zinc gluconate 50 MG tablet Take 50 mg by mouth daily.       All past medical history, surgical history, allergies, family history, immunizations andmedications were updated in the EMR today and reviewed under the history and medication portions of their EMR.     ROS: Negative, with the exception of above mentioned in HPI   Objective:  BP (!) 170/54 (BP Location: Right Arm, Patient Position: Sitting, Cuff Size: Normal)   Pulse 62   Temp 98 F (36.7 C)   Resp 20   Wt 153 lb 4 oz (69.5 kg)   SpO2 99%   BMI 23.30 kg/m  Body mass index is 23.3 kg/m. Gen: Afebrile. No acute distress. Nontoxic in appearance, well developed, well nourished. Very pleasant caucasian male.  HENT: AT. [place]. Bilateral TM visualized without erythema or bulging. MMM, no oral lesions. Bilateral nares with drainage, left with dried blood. Mild erythema. Throat without erythema or exudates. No cough, no hoarseness. TTP left max sinus.  Eyes:Pupils Equal Round Reactive to light, Extraocular movements intact,  Conjunctiva without redness, discharge or icterus. Neck/lymp/endocrine: Supple,no lymphadenopathy CV: RRR, no edema Chest: CTAB, no  wheeze or crackles. Good air movement, normal resp effort.  Abd: Soft.  NTND. BS present.  Skin: no rashes, purpura or petechiae.  Neuro:  Normal gait. PERLA. EOMi. Alert. Oriented x3   No exam data present No results found. No results found for this or any previous visit (from the past 24 hour(s)).  Assessment/Plan: [name] is a 83 y.o. male present for OV for  Acute maxillary sinusitis, recurrence not specified Rest, hydrate. Mucinex.  Doxy BID. OTC claritin.  DC flonase.  F/U 2 weeks if not improved, sooner  if worsening.    Reviewed expectations re: course of current medical issues.  Discussed self-management of symptoms.  Outlined signs and symptoms indicating need for more acute intervention.  Patient verbalized understanding and all questions were answered.  Patient received an After-Visit Summary.    Note is dictated utilizing voice recognition software. Although note has been proof read prior to signing, occasional typographical errors still can be missed. If any questions arise, please do not hesitate to call for verification.   electronically signed by:  Howard Pouch, DO  [place]

## 2016-09-18 ENCOUNTER — Other Ambulatory Visit (HOSPITAL_BASED_OUTPATIENT_CLINIC_OR_DEPARTMENT_OTHER): Payer: Medicare Other

## 2016-09-18 ENCOUNTER — Ambulatory Visit (HOSPITAL_BASED_OUTPATIENT_CLINIC_OR_DEPARTMENT_OTHER): Payer: Medicare Other | Admitting: Hematology & Oncology

## 2016-09-18 ENCOUNTER — Ambulatory Visit (HOSPITAL_BASED_OUTPATIENT_CLINIC_OR_DEPARTMENT_OTHER): Payer: Medicare Other

## 2016-09-18 ENCOUNTER — Telehealth: Payer: Self-pay | Admitting: Family Medicine

## 2016-09-18 VITALS — BP 151/52 | HR 52 | Temp 97.6°F | Resp 18 | Wt 152.2 lb

## 2016-09-18 DIAGNOSIS — N183 Chronic kidney disease, stage 3 unspecified: Secondary | ICD-10-CM

## 2016-09-18 DIAGNOSIS — N189 Chronic kidney disease, unspecified: Secondary | ICD-10-CM

## 2016-09-18 DIAGNOSIS — D509 Iron deficiency anemia, unspecified: Secondary | ICD-10-CM

## 2016-09-18 DIAGNOSIS — D500 Iron deficiency anemia secondary to blood loss (chronic): Secondary | ICD-10-CM | POA: Diagnosis not present

## 2016-09-18 DIAGNOSIS — D631 Anemia in chronic kidney disease: Secondary | ICD-10-CM

## 2016-09-18 DIAGNOSIS — R7989 Other specified abnormal findings of blood chemistry: Secondary | ICD-10-CM | POA: Diagnosis not present

## 2016-09-18 LAB — CMP (CANCER CENTER ONLY)
ALK PHOS: 75 U/L (ref 26–84)
ALT: 32 U/L (ref 10–47)
AST: 29 U/L (ref 11–38)
Albumin: 3.2 g/dL — ABNORMAL LOW (ref 3.3–5.5)
BUN, Bld: 54 mg/dL — ABNORMAL HIGH (ref 7–22)
CO2: 16 mEq/L — ABNORMAL LOW (ref 18–33)
Calcium: 8.5 mg/dL (ref 8.0–10.3)
Chloride: 113 mEq/L — ABNORMAL HIGH (ref 98–108)
Creat: 2.6 mg/dl — ABNORMAL HIGH (ref 0.6–1.2)
Glucose, Bld: 126 mg/dL — ABNORMAL HIGH (ref 73–118)
Sodium: 138 mEq/L (ref 128–145)
Total Bilirubin: 0.6 mg/dl (ref 0.20–1.60)
Total Protein: 6.8 g/dL (ref 6.4–8.1)

## 2016-09-18 LAB — CBC WITH DIFFERENTIAL (CANCER CENTER ONLY)
BASO#: 0 10*3/uL (ref 0.0–0.2)
BASO%: 0.2 % (ref 0.0–2.0)
EOS ABS: 0.2 10*3/uL (ref 0.0–0.5)
EOS%: 2.4 % (ref 0.0–7.0)
HCT: 31.9 % — ABNORMAL LOW (ref 38.7–49.9)
HGB: 10.7 g/dL — ABNORMAL LOW (ref 13.0–17.1)
LYMPH#: 1.9 10*3/uL (ref 0.9–3.3)
LYMPH%: 22.2 % (ref 14.0–48.0)
MCH: 31.9 pg (ref 28.0–33.4)
MCHC: 33.5 g/dL (ref 32.0–35.9)
MCV: 95 fL (ref 82–98)
MONO#: 0.6 10*3/uL (ref 0.1–0.9)
MONO%: 6.6 % (ref 0.0–13.0)
NEUT%: 68.6 % (ref 40.0–80.0)
NEUTROS ABS: 5.9 10*3/uL (ref 1.5–6.5)
PLATELETS: 192 10*3/uL (ref 145–400)
RBC: 3.35 10*6/uL — ABNORMAL LOW (ref 4.20–5.70)
RDW: 14.1 % (ref 11.1–15.7)
WBC: 8.6 10*3/uL (ref 4.0–10.0)

## 2016-09-18 LAB — IRON AND TIBC
%SAT: 63 % — ABNORMAL HIGH (ref 20–55)
Iron: 125 ug/dL (ref 42–163)
TIBC: 196 ug/dL — ABNORMAL LOW (ref 202–409)
UIBC: 72 ug/dL — AB (ref 117–376)

## 2016-09-18 LAB — FERRITIN: Ferritin: 663 ng/ml — ABNORMAL HIGH (ref 22–316)

## 2016-09-18 MED ORDER — DARBEPOETIN ALFA 300 MCG/0.6ML IJ SOSY
300.0000 ug | PREFILLED_SYRINGE | Freq: Once | INTRAMUSCULAR | Status: AC
  Administered 2016-09-18: 300 ug via SUBCUTANEOUS

## 2016-09-18 NOTE — Telephone Encounter (Signed)
Please call pt: - his kidney function panel collected at his hematologist office has resulted in elevated cr/BUN. He was told to hold his lisinopril. Pt is recovering from an acute illness and was taking Doxy which is not typically a concern with kidney function. However if he became dehydrated during illness, this could have caused the changes.   I would like him to make certain to push the fluids over the weekend. Hold lisinopril for now. He can take the clonidine 0.1 mg every 12 hours scheduled, for BP control while off lisinopril (make sure he has at home). If still elevated on this that regimen >170/90, he can increase dose to 0.2 mg every 12 hours.  - I would like to see him Tuesday morning. We will repeat labs at that time.  - If he starts to feel worse, dizzy, fatigued, decrease urine output before Tuesday he should be seen urgently in ED over the weekend.

## 2016-09-18 NOTE — Telephone Encounter (Signed)
Spoke with patient reviewed lab results and instructions. Patient verbalized understanding. Scheduled patient to be seen 09/23/16 as requested.

## 2016-09-18 NOTE — Patient Instructions (Signed)

## 2016-09-18 NOTE — Progress Notes (Signed)
Hematology and Oncology Follow Up Visit  [name] [id number] 04-11-1929 83 y.o. 09/18/2016   Principle Diagnosis:   Multifactorial anemia  Erythropoietin deficiency anemia  Iron deficiency anemia  Current Therapy:    IV iron as indicated  Aranesp 300 g subcutaneous as needed for hemoglobin less than 11     Interim History:  [name] is back for follow-up. He is doing so well right now.  He had no problems since we last saw him.  His last iron studies back in February showed a ferritin of 760 with an iron saturation of 46%.   His last dose of iron was back in early December.  He has more energy. He recently had a sinus infection. He was placed on some doxycycline.  He has had no bleeding. He is on ELIQUIS. He is doing well with the ELIQUIS.   Overall, his performance status is ECOG 1.  Medications:  Current Outpatient Prescriptions:  .  allopurinol (ZYLOPRIM) 300 MG tablet, Take 0.5 tablets (150 mg total) by mouth daily., Disp: 45 tablet, Rfl: 3 .  Aloe Vera 25 MG CAPS, Take 2 capsules by mouth daily., Disp: , Rfl:  .  aspirin 81 MG tablet, Take 81 mg by mouth at bedtime. , Disp: , Rfl:  .  atorvastatin (LIPITOR) 20 MG tablet, TAKE 1 TABLET AT BEDTIME, Disp: 90 tablet, Rfl: 0 .  carvedilol (COREG) 12.5 MG tablet, TAKE 1 TABLET TWICE A DAY, Disp: 180 tablet, Rfl: 1 .  cholecalciferol (VITAMIN D) 1000 units tablet, Take 5,000 Units by mouth daily., Disp: , Rfl:  .  CHROMIUM GTF PO, Take 2 capsules by mouth daily., Disp: , Rfl:  .  Cinnamon 500 MG TABS, Take 2 tablets by mouth at bedtime. , Disp: , Rfl:  .  cloNIDine (CATAPRES) 0.1 MG tablet, Take 1 tablet (0.1 mg total) by mouth 2 (two) times daily as needed., Disp: 180 tablet, Rfl: 1 .  Coenzyme Q10 (CO Q 10) 100 MG CAPS, Take 1 capsule by mouth daily. , Disp: , Rfl:  .  DARBEPOETIN ALFA-ALBUMIN IJ, Inject as directed., Disp: , Rfl:  .  doxycycline (VIBRA-TABS) 100 MG tablet, Take 1 tablet (100 mg total) by  mouth 2 (two) times daily., Disp: 20 tablet, Rfl: 0 .  ELIQUIS 2.5 MG TABS tablet, TAKE 1 TABLET TWICE A DAY, Disp: 180 tablet, Rfl: 1 .  fish oil-omega-3 fatty acids 1000 MG capsule, Take 1 g by mouth daily. , Disp: , Rfl:  .  glucose blood (PRODIGY NO CODING BLOOD GLUC) test strip, Use as instructed once daily, Disp: 100 each, Rfl: 12 .  lisinopril (PRINIVIL,ZESTRIL) 40 MG tablet, Take 1 tablet (40 mg total) by mouth daily., Disp: 90 tablet, Rfl: 1 .  Multiple Vitamins-Minerals (PRESERVISION AREDS PO), Take 1 tablet by mouth 2 (two) times daily. , Disp: , Rfl:  .  nitroGLYCERIN (NITROSTAT) 0.4 MG SL tablet, Place 0.4 mg under the tongue every 5 (five) minutes as needed (up to 3 doses only). For chest pain, Disp: , Rfl:  .  Probiotic Product (PROBIOTIC PO), Take 1 tablet by mouth daily., Disp: , Rfl:  .  PRODIGY SAFETY LANCETS 26G MISC, Check fasting glucose once daily, Disp: 100 each, Rfl: 1 .  tamsulosin (FLOMAX) 0.4 MG CAPS capsule, Take 1 capsule (0.4 mg total) by mouth daily after supper., Disp: 90 capsule, Rfl: 1 .  VANADYL SULFATE PO, Take 1 capsule by mouth daily., Disp: , Rfl:  .  zinc gluconate 50  MG tablet, Take 50 mg by mouth daily. , Disp: , Rfl:   Allergies:  Allergies  Allergen Reactions  . Penicillins Hives    Past Medical History, Surgical history, Social history, and Family History were reviewed and updated.  Review of Systems:  As above  Physical Exam:  weight is 152 lb 4 oz (69.1 kg). His oral temperature is 97.6 F (36.4 C). His blood pressure is 151/52 (abnormal) and his pulse is 52 (abnormal). His respiration is 18 and oxygen saturation is 100%.   Wt Readings from Last 3 Encounters:  09/18/16 152 lb 4 oz (69.1 kg)  09/12/16 153 lb 4 oz (69.5 kg)  06/18/16 154 lb 4 oz (70 kg)      Elderly white male in no obvious distress. Head exam shows no ocular or oral lesions. There are no palpable cervical or supraclavicular lymph nodes. Lungs are clear. Cardiac exam  regular rate and rhythm with an occasional extra beat. He has 1/6 systolic murmur. Abdomen is soft. He has good bowel sounds. There is no fluid wave. There is no palpable liver or spleen tip. Back exam shows no tenderness over the spine, ribs or hips. Extremities show some trace edema in his legs. Skin exam shows no rashes, ecchymoses or petechia. Neurological exam shows no focal neurological deficits.  Lab Results  Component Value Date   WBC 8.6 09/18/2016   HGB 10.7 (L) 09/18/2016   HCT 31.9 (L) 09/18/2016   MCV 95 09/18/2016   PLT 192 09/18/2016     Chemistry      Component Value Date/Time   NA 138 09/18/2016 1014   NA 140 03/28/2016 0853   K 5.0 No visible hemolysis (H) 09/18/2016 1014   K 4.4 03/28/2016 0853   CL 113 (H) 09/18/2016 1014   CO2 16 (L) 09/18/2016 1014   CO2 19 (L) 03/28/2016 0853   BUN 54 (H) 09/18/2016 1014   BUN 34.4 (H) 03/28/2016 0853   CREATININE 2.6 (H) 09/18/2016 1014   CREATININE 1.7 (H) 03/28/2016 0853   GLU 113 07/24/2016      Component Value Date/Time   CALCIUM 8.5 09/18/2016 1014   CALCIUM 9.2 03/28/2016 0853   ALKPHOS 75 09/18/2016 1014   ALKPHOS 85 03/28/2016 0853   AST 29 09/18/2016 1014   AST 23 03/28/2016 0853   ALT 32 09/18/2016 1014   ALT 37 03/28/2016 0853   BILITOT 0.60 09/18/2016 1014   BILITOT 0.79 03/28/2016 0853         Impression and Plan: [name] is a 83 year old white male with multifactorial anemia.   He will need Aranesp today. His hemoglobin is only 10.7.  I just really am not sure as to why his BUN and creatinine are so high. This is new for him. In reviewing his labs, his creatinine has been going up slowly. Her vital him to stop his lisinopril.  I will get hold of his family doctor, Dr. Raoul Pitch, so she might be able to help [place] out with his renal function.  I would like to get him back in one month.   [name], MD 5/24/201812:11 PM'

## 2016-09-19 LAB — RETICULOCYTES: RETICULOCYTE COUNT: 2.8 % — AB (ref 0.6–2.6)

## 2016-09-23 ENCOUNTER — Ambulatory Visit (INDEPENDENT_AMBULATORY_CARE_PROVIDER_SITE_OTHER): Payer: Medicare Other | Admitting: Family Medicine

## 2016-09-23 ENCOUNTER — Encounter: Payer: Self-pay | Admitting: Family Medicine

## 2016-09-23 VITALS — BP 179/68 | HR 53 | Temp 98.0°F | Resp 20 | Ht 68.0 in | Wt 152.0 lb

## 2016-09-23 DIAGNOSIS — R7989 Other specified abnormal findings of blood chemistry: Secondary | ICD-10-CM | POA: Diagnosis not present

## 2016-09-23 NOTE — Patient Instructions (Signed)
Continue to hold lisinopril until I get your labs back.  Take clonidine 0.1 mg every 12 hours (as long a BP not in normal range or low). Just until we are able to add back the lisinopril I am hoping the changes we saw were secondary to your recent illness and dehydration.

## 2016-09-23 NOTE — Progress Notes (Signed)
[name] , 1928/06/07, 83 y.o., male MRN: [id number] Patient Care Team    Relationship Specialty Notifications Start End  [name], DO PCP - General Family Medicine  12/18/15   [name], MD Consulting Physician Ophthalmology  12/18/15   [name], MD Consulting Physician Dermatology  12/18/15   [name], MD Consulting Physician Cardiology  12/18/15   [name], MD Consulting Physician Ophthalmology  12/19/15   Laurence Spates, MD Consulting Physician Gastroenterology  12/19/15     Chief Complaint  Patient presents with  . Results    abnormal labs  . Hypertension     Subjective: Pt presents for an OV after elevated Cr 2.6 and BUN 54. Pt was advised to stop lisinopril, push fluids. He denies blood loss, with the exception of mild bloody nose at the beginning of his sinus infection. He has since stopped the floanse as directed. He had an acute illness and on doxycyline, which he now completed. He is feeling well. He did receive a darbepoetin injection last Thursday.    Recent Labs Lab 09/18/16 1014  NA 138  K 5.0 No visible hemolysis*  CL 113*  CO2 16*  GLUCOSE 126*  BUN 54*  CREATININE 2.6*  CALCIUM 8.5     Depression screen PHQ 2/9 12/18/2015  Decreased Interest 0  Down, Depressed, Hopeless 0  PHQ - 2 Score 0    Allergies  Allergen Reactions  . Penicillins Hives   Social History  Substance Use Topics  . Smoking status: Former Smoker    Quit date: 12/25/1961  . Smokeless tobacco: Former Systems developer    Types: Chew    Quit date: 02/03/1969  . Alcohol use No   Past Medical History:  Diagnosis Date  . Basal cell carcinoma    skin  . Bigeminal rhythm   . Cataract   . Coronary artery disease    post bypass  . CVD (cardiovascular disease)   . Diabetes mellitus without complication ([place])   . Diverticular disease   . Easy bruising   . 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 Gout   . Hernia   . Hyperlipidemia   . Hypertension    . Iron deficiency anemia 02/04/2016  . Macular degeneration   . Microscopic colitis   . PVD (peripheral vascular disease) ([place])    Past Surgical History:  Procedure Laterality Date  . CARDIAC CATHETERIZATION  2006  . CARDIOVERSION N/A 02/22/2013   Procedure: CARDIOVERSION;  Surgeon: [name], MD;  [facility];  Service: Cardiovascular;  Laterality: N/A;  . CARDIOVERSION N/A 11/15/2014   Procedure: CARDIOVERSION;  Surgeon: [name], MD;  Location: [place];  Service: Cardiovascular;  Laterality: N/A;  . CAROTID ENDARTERECTOMY  2009/ 1993   left/ right  . CORONARY ARTERY BYPASS GRAFT  1999  . EYE SURGERY     eyelid repair  . INGUINAL HERNIA REPAIR  02/11/2012   Procedure: LAPAROSCOPIC BILATERAL INGUINAL HERNIA REPAIR;  Surgeon: Pedro Earls, MD;  Location: [facility];  Service: General;  Laterality: Bilateral;  . SKIN CANCER EXCISION     right ear x 3   Family History  Problem Relation Age of Onset  . Stroke Mother   . Coronary artery disease Father   . Heart disease Father   . Melanoma Sister    Allergies as of 09/23/2016      Reactions   Penicillins Hives      Medication List  Accurate as of 09/23/16  8:51 AM. Always use your most recent med list.          allopurinol 300 MG tablet Commonly known as:  ZYLOPRIM Take 0.5 tablets (150 mg total) by mouth daily.   Aloe Vera 25 MG Caps Take 2 capsules by mouth daily.   aspirin 81 MG tablet Take 81 mg by mouth at bedtime.   atorvastatin 20 MG tablet Commonly known as:  LIPITOR TAKE 1 TABLET AT BEDTIME   carvedilol 12.5 MG tablet Commonly known as:  COREG TAKE 1 TABLET TWICE A DAY   cholecalciferol 1000 units tablet Commonly known as:  VITAMIN D Take 5,000 Units by mouth daily.   CHROMIUM GTF PO Take 2 capsules by mouth daily.   Cinnamon 500 MG Tabs Take 2 tablets by mouth at bedtime.   cloNIDine 0.1 MG tablet Commonly known as:  CATAPRES Take 1 tablet (0.1 mg total) by  mouth 2 (two) times daily as needed.   Co Q 10 100 MG Caps Take 1 capsule by mouth daily.   DARBEPOETIN ALFA-ALBUMIN IJ Inject as directed.   ELIQUIS 2.5 MG Tabs tablet Generic drug:  apixaban TAKE 1 TABLET TWICE A DAY   fish oil-omega-3 fatty acids 1000 MG capsule Take 1 g by mouth daily.   glucose blood test strip Commonly known as:  PRODIGY NO CODING BLOOD GLUC Use as instructed once daily   lisinopril 40 MG tablet Commonly known as:  PRINIVIL,ZESTRIL Take 1 tablet (40 mg total) by mouth daily.   nitroGLYCERIN 0.4 MG SL tablet Commonly known as:  NITROSTAT Place 0.4 mg under the tongue every 5 (five) minutes as needed (up to 3 doses only). For chest pain   PRESERVISION AREDS PO Take 1 tablet by mouth 2 (two) times daily.   PROBIOTIC PO Take 1 tablet by mouth daily.   PRODIGY SAFETY LANCETS 26G Misc Check fasting glucose once daily   tamsulosin 0.4 MG Caps capsule Commonly known as:  FLOMAX Take 1 capsule (0.4 mg total) by mouth daily after supper.   VANADYL SULFATE PO Take 1 capsule by mouth daily.   zinc gluconate 50 MG tablet Take 50 mg by mouth daily.       All past medical history, surgical history, allergies, family history, immunizations andmedications were updated in the EMR today and reviewed under the history and medication portions of their EMR.     ROS: Negative, with the exception of above mentioned in HPI  Objective:  BP (!) 179/68 (BP Location: Right Arm, Patient Position: Sitting, Cuff Size: Normal)   Pulse (!) 53   Temp 98 F (36.7 C)   Resp 20   Ht 5\' 8"  (1.727 m)   Wt 152 lb (68.9 kg)   SpO2 100%   BMI 23.11 kg/m  Body mass index is 23.11 kg/m. Gen: Afebrile. No acute distress. Nontoxic in appearance, well developed, well nourished. Very pleasant caucasian male.  HENT: AT. [place]. MMM, no oral lesions.  Eyes:Pupils Equal Round Reactive to light, Extraocular movements intact,  Conjunctiva without redness, discharge or  icterus. Neck/lymp/endocrine: Supple,no lymphadenopathy CV: RRR  Chest: CTAB, no wheeze or crackles. Good air movement, normal resp effort.  Abd: Soft. NTND. BS present Neuro:  Normal gait. PERLA. EOMi. Alert. Oriented x3   No exam data present No results found. No results found for this or any previous visit (from the past 24 hour(s)).  Assessment/Plan: [name] is a 83 y.o. male present for OV for  Elevated serum creatinine - Continue to hold lisinopril until lab resulted. Suspect lisinopril and mild dehydration with acute illness caused spike in creatinine. Hopefully it has returned to normal now that he has hydrated and feels better.  - Clonidine 0.1 mg BID scheduled until able to restart lisinopril.  - BASIC METABOLIC PANEL WITH GFR - F/U dependent on lab results.    Reviewed expectations re: course of current medical issues.  Discussed self-management of symptoms.  Outlined signs and symptoms indicating need for more acute intervention.  Patient verbalized understanding and all questions were answered.  Patient received an After-Visit Summary.     Note is dictated utilizing voice recognition software. Although note has been proof read prior to signing, occasional typographical errors still can be missed. If any questions arise, please do not hesitate to call for verification.   electronically signed by:  Howard Pouch, DO  [place]

## 2016-09-24 ENCOUNTER — Telehealth: Payer: Self-pay | Admitting: Family Medicine

## 2016-09-24 LAB — BASIC METABOLIC PANEL WITH GFR
BUN: 36 mg/dL — ABNORMAL HIGH (ref 7–25)
CHLORIDE: 116 mmol/L — AB (ref 98–110)
CO2: 16 mmol/L — AB (ref 20–31)
Calcium: 8.8 mg/dL (ref 8.6–10.3)
Creat: 1.98 mg/dL — ABNORMAL HIGH (ref 0.70–1.11)
GFR, Est African American: 34 mL/min — ABNORMAL LOW (ref >=60)
GFR, Est Non African American: 30 mL/min — ABNORMAL LOW (ref >=60)
Glucose, Bld: 111 mg/dL — ABNORMAL HIGH (ref 65–99)
Potassium: 4.9 mmol/L (ref 3.5–5.3)
SODIUM: 141 mmol/L (ref 135–146)

## 2016-09-24 NOTE — Telephone Encounter (Signed)
Spoke with patient reviewed lab results and instructions with patient. Patient verbalized understanding of all instructions.

## 2016-09-24 NOTE — Telephone Encounter (Signed)
Please call pt: - his kidney function is almost back to his baseline, Creatinine down from 2.6 --> 1.98 (his baseline ~1.8). Continue to push the fluids as we discussed. He can Dc the clonidine  regimen and restart lisinopril for his BP per his usual routine.

## 2016-09-29 ENCOUNTER — Other Ambulatory Visit: Payer: Self-pay | Admitting: Cardiovascular Disease

## 2016-09-29 DIAGNOSIS — I779 Disorder of arteries and arterioles, unspecified: Secondary | ICD-10-CM

## 2016-09-29 DIAGNOSIS — I739 Peripheral vascular disease, unspecified: Principal | ICD-10-CM

## 2016-09-29 NOTE — Progress Notes (Addendum)
Subjective:   [name] is a 83 y.o. male who presents for an Initial Medicare Annual Wellness Visit.  Review of Systems  No ROS.  Medicare Wellness Visit.  Cardiac Risk Factors include: diabetes mellitus;dyslipidemia;hypertension;male gender;advanced age (>54men, >52 women);family history of premature cardiovascular disease   Sleep patterns: Sleeps 7-8 hours, feels rested. Up to void x 1.  Home Safety/Smoke Alarms: Smoke detectors in place.    Living environment; residence and Firearm Safety: Lives with wife in 1 story home, steps with rail at door. Feels safe in home. Firearms locked away.  Seat Belt Safety/Bike Helmet: Wears seat belt.   Counseling:   Eye Exam-Last exam 04/14/2016, every 6 months. [facility]. Dr. Baird Cancer.  Dental-Last exam < 6 months, every 6 months. Dr. Lavonne Chick.   Male:   CCS-01/16/2010, [facility]. Pt reports normal.  PSA- 11/20/2011, 0.000. [facility]       Objective:    Today's Vitals   09/30/16 1008  BP: (!) 146/60  Pulse: 61  SpO2: 98%  Weight: 153 lb 1.9 oz (69.5 kg)  Height: 5\' 8"  (1.727 m)   Body mass index is 23.28 kg/m.  Current Medications (verified) Outpatient Encounter Prescriptions as of 09/30/2016  Medication Sig  . allopurinol (ZYLOPRIM) 300 MG tablet Take 0.5 tablets (150 mg total) by mouth daily.  . Aloe Vera 25 MG CAPS Take 2 capsules by mouth daily.  Marland Kitchen aspirin 81 MG tablet Take 81 mg by mouth at bedtime.   Marland Kitchen atorvastatin (LIPITOR) 20 MG tablet TAKE 1 TABLET AT BEDTIME  . carvedilol (COREG) 12.5 MG tablet TAKE 1 TABLET TWICE A DAY  . cholecalciferol (VITAMIN D) 1000 units tablet Take 5,000 Units by mouth daily.  . CHROMIUM GTF PO Take 2 capsules by mouth daily.  . Cinnamon 500 MG TABS Take 2 tablets by mouth at bedtime.   . cloNIDine (CATAPRES) 0.1 MG tablet Take 1 tablet (0.1 mg total) by mouth 2 (two) times daily as needed.  . Coenzyme Q10 (CO Q 10) 100 MG CAPS Take 1 capsule by mouth daily.   Marland Kitchen DARBEPOETIN  ALFA-ALBUMIN IJ Inject as directed.  Marland Kitchen ELIQUIS 2.5 MG TABS tablet TAKE 1 TABLET TWICE A DAY  . fish oil-omega-3 fatty acids 1000 MG capsule Take 1 g by mouth daily.   Marland Kitchen glucose blood (PRODIGY NO CODING BLOOD GLUC) test strip Use as instructed once daily  . lisinopril (PRINIVIL,ZESTRIL) 40 MG tablet Take 1 tablet (40 mg total) by mouth daily.  . Multiple Vitamins-Minerals (PRESERVISION AREDS PO) Take 1 tablet by mouth 2 (two) times daily.   . nitroGLYCERIN (NITROSTAT) 0.4 MG SL tablet Place 0.4 mg under the tongue every 5 (five) minutes as needed (up to 3 doses only). For chest pain  . Probiotic Product (PROBIOTIC PO) Take 1 tablet by mouth daily.  Marland Kitchen PRODIGY SAFETY LANCETS 26G MISC Check fasting glucose once daily  . tamsulosin (FLOMAX) 0.4 MG CAPS capsule Take 1 capsule (0.4 mg total) by mouth daily after supper.  Marland Kitchen VANADYL SULFATE PO Take 1 capsule by mouth daily.  Marland Kitchen zinc gluconate 50 MG tablet Take 50 mg by mouth daily.    No facility-administered encounter medications on file as of 09/30/2016.     Allergies (verified) Penicillins   History: Past Medical History:  Diagnosis Date  . Basal cell carcinoma    skin  . Bigeminal rhythm   . Cataract   . Coronary artery disease    post bypass  . CVD (cardiovascular disease)   .  Diabetes mellitus without complication ([place])   . Diverticular disease   . Easy bruising   . 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 Gout   . Hernia   . Hyperlipidemia   . Hypertension   . Iron deficiency anemia 02/04/2016  . Macular degeneration   . Microscopic colitis   . PVD (peripheral vascular disease) ([place])    Past Surgical History:  Procedure Laterality Date  . CARDIAC CATHETERIZATION  2006  . CARDIOVERSION N/A 02/22/2013   Procedure: CARDIOVERSION;  Surgeon: [name], MD;  Location: [facility] ENDOSCOPY;  Service: Cardiovascular;  Laterality: N/A;  . CARDIOVERSION N/A 11/15/2014   Procedure: CARDIOVERSION;  Surgeon: [name], MD;   Location: [place];  Service: Cardiovascular;  Laterality: N/A;  . CAROTID ENDARTERECTOMY  2009/ 1993   left/ right  . CORONARY ARTERY BYPASS GRAFT  1999  . EYE SURGERY     eyelid repair  . INGUINAL HERNIA REPAIR  02/11/2012   Procedure: LAPAROSCOPIC BILATERAL INGUINAL HERNIA REPAIR;  Surgeon: Pedro Earls, MD;  Location: [facility];  Service: General;  Laterality: Bilateral;  . SKIN CANCER EXCISION     right ear x 3   Family History  Problem Relation Age of Onset  . Stroke Mother   . Coronary artery disease Father   . Heart disease Father   . Melanoma Sister    Social History   Occupational History  . retired    Social History Main Topics  . Smoking status: Former Smoker    Quit date: 12/25/1961  . Smokeless tobacco: Former Systems developer    Types: Chew    Quit date: 02/03/1969  . Alcohol use No  . Drug use: No  . Sexual activity: No   Tobacco Counseling Counseling given: No   Activities of Daily Living In your present state of health, do you have any difficulty performing the following activities: 09/30/2016  Hearing? N  Vision? N  Difficulty concentrating or making decisions? N  Walking or climbing stairs? N  Dressing or bathing? N  Doing errands, shopping? N  Preparing Food and eating ? N  Using the Toilet? N  In the past six months, have you accidently leaked urine? N  Do you have problems with loss of bowel control? N  Managing your Medications? N  Managing your Finances? N  Housekeeping or managing your Housekeeping? N  Some recent data might be hidden    Immunizations and Health Maintenance Immunization History  Administered Date(s) Administered  . Influenza,inj,Quad PF,36+ Mos 02/07/2016  . Influenza-Unspecified 01/08/2015   Health Maintenance Due  Topic Date Due  . TETANUS/TDAP  07/29/1949  . PNA vac Low Risk Adult (1 of 2 - PCV13) 07/30/1995  . HEMOGLOBIN A1C  06/19/2016    Patient Care Team: [name], DO as PCP - General (Family  Medicine) [name], MD as Consulting Physician (Ophthalmology) [name], MD as Consulting Physician (Dermatology) [name], MD as Consulting Physician (Cardiology) [name], MD as Consulting Physician (Ophthalmology) Marin Olp Rudell Cobb, MD as Consulting Physician (Oncology) [name], MD as Attending Physician (Nephrology) [name], MD as Consulting Physician (Dermatology)  Indicate any recent Medical Services you may have received from other than [facility] providers in the past year (date may be approximate).    Assessment:   This is a routine wellness examination for [place]. Physical assessment deferred to PCP.   Hearing/Vision screen Hearing Screening Comments: Hearing aids bilaterally. [facility].  Vision Screening Comments: Wears glasses. H/O Cataract  surgery.   Dietary issues and exercise activities discussed: Current Exercise Habits: Structured exercise class (Silver Sneakers), Type of exercise: walking;treadmill;strength training/weights;stretching, Time (Minutes): > 60, Frequency (Times/Week): 3, Weekly Exercise (Minutes/Week): 0, Exercise limited by: None identified   Diet (meal preparation, eat out, water intake, caffeinated beverages, dairy products, fruits and vegetables):  Drinks water and coffee.  Breakfast: oatmeal, cereal, eggs, sausage, coffee Lunch: peanut butter crackers, graham crackers, fruit Dinner: protein, vegetables.      Discussed heart healthy diet, encouraged to remain active.   Goals      Patient Stated   . patient states (pt-stated)          "Get back on the golf course" by continuing to increase activity.       Depression Screen PHQ 2/9 Scores 09/30/2016 12/18/2015  PHQ - 2 Score 0 0    Fall Risk Fall Risk  09/30/2016 03/28/2016 02/14/2016 02/07/2016 01/29/2016  Falls in the past year? Yes No No No No  Number falls in past yr: 1 - - - -  Injury with Fall? No - - - -  Follow up Falls prevention discussed - - - -     Cognitive Function:       Ad8 score reviewed for issues:  Issues making decisions: no  Less interest in hobbies / activities: no  Repeats questions, stories (family complaining): no  Trouble using ordinary gadgets (microwave, computer, phone): no  Forgets the month or year: no  Mismanaging finances: no  Remembering appts: no  Daily problems with thinking and/or memory: no Ad8 score is=0     Screening Tests Health Maintenance  Topic Date Due  . TETANUS/TDAP  07/29/1949  . PNA vac Low Risk Adult (1 of 2 - PCV13) 07/30/1995  . HEMOGLOBIN A1C  06/19/2016  . INFLUENZA VACCINE  11/26/2016  . FOOT EXAM  12/17/2016  . OPHTHALMOLOGY EXAM  04/14/2017   Patient declines PCV13, states he had this at [place] approx 2 years ago. Will call for record.      Plan:    Continue doing brain stimulating activities (puzzles, reading, adult coloring books, staying active) to keep memory sharp.   Bring a copy of your advance directives to your next office visit.  I have personally reviewed and noted the following in the patient's chart:   . Medical and social history . Use of alcohol, tobacco or illicit drugs  . Current medications and supplements . Functional ability and status . Nutritional status . Physical activity . Advanced directives . List of other physicians . Hospitalizations, surgeries, and ER visits in previous 12 months . Vitals . Screenings to include cognitive, depression, and falls . Referrals and appointments  In addition, I have reviewed and discussed with patient certain preventive protocols, quality metrics, and best practice recommendations. A written personalized care plan for preventive services as well as general preventive health recommendations were provided to patient.     Gerilyn Nestle, RN   09/30/2016   PCP Notes: -Pt not fasting (plans to fast for next PCP appt) -Declines PCV13 today, states he had this at [place] approx 2 years ago. Will  call for record.  -Next f/u appt with PCP 01/15/2017.   Medical screening examination/treatment/procedure(s) were performed by non-physician practitioner and as supervising physician I was immediately available for consultation/collaboration.  I agree with above assessment and plan.  Electronically Signed by: Howard Pouch, DO [place] [facility] [place]

## 2016-09-30 ENCOUNTER — Ambulatory Visit (INDEPENDENT_AMBULATORY_CARE_PROVIDER_SITE_OTHER): Payer: Medicare Other

## 2016-09-30 VITALS — BP 146/60 | HR 61 | Ht 68.0 in | Wt 153.1 lb

## 2016-09-30 DIAGNOSIS — Z0000 Encounter for general adult medical examination without abnormal findings: Secondary | ICD-10-CM | POA: Diagnosis not present

## 2016-09-30 NOTE — Patient Instructions (Addendum)
Continue doing brain stimulating activities (puzzles, reading, adult coloring books, staying active) to keep memory sharp.   Bring a copy of your advance directives to your next office visit.  Fall Prevention in the Home Falls can cause injuries. They can happen to people of all ages. There are many things you can do to make your home safe and to help prevent falls. What can I do on the outside of my home?  Regularly fix the edges of walkways and driveways and fix any cracks.  Remove anything that might make you trip as you walk through a door, such as a raised step or threshold.  Trim any bushes or trees on the path to your home.  Use bright outdoor lighting.  Clear any walking paths of anything that might make someone trip, such as rocks or tools.  Regularly check to see if handrails are loose or broken. Make sure that both sides of any steps have handrails.  Any raised decks and porches should have guardrails on the edges.  Have any leaves, snow, or ice cleared regularly.  Use sand or salt on walking paths during winter.  Clean up any spills in your garage right away. This includes oil or grease spills. What can I do in the bathroom?  Use night lights.  Install grab bars by the toilet and in the tub and shower. Do not use towel bars as grab bars.  Use non-skid mats or decals in the tub or shower.  If you need to sit down in the shower, use a plastic, non-slip stool.  Keep the floor dry. Clean up any water that spills on the floor as soon as it happens.  Remove soap buildup in the tub or shower regularly.  Attach bath mats securely with double-sided non-slip rug tape.  Do not have throw rugs and other things on the floor that can make you trip. What can I do in the bedroom?  Use night lights.  Make sure that you have a light by your bed that is easy to reach.  Do not use any sheets or blankets that are too big for your bed. They should not hang down onto the  floor.  Have a firm chair that has side arms. You can use this for support while you get dressed.  Do not have throw rugs and other things on the floor that can make you trip. What can I do in the kitchen?  Clean up any spills right away.  Avoid walking on wet floors.  Keep items that you use a lot in easy-to-reach places.  If you need to reach something above you, use a strong step stool that has a grab bar.  Keep electrical cords out of the way.  Do not use floor polish or wax that makes floors slippery. If you must use wax, use non-skid floor wax.  Do not have throw rugs and other things on the floor that can make you trip. What can I do with my stairs?  Do not leave any items on the stairs.  Make sure that there are handrails on both sides of the stairs and use them. Fix handrails that are broken or loose. Make sure that handrails are as long as the stairways.  Check any carpeting to make sure that it is firmly attached to the stairs. Fix any carpet that is loose or worn.  Avoid having throw rugs at the top or bottom of the stairs. If you do have throw rugs, attach  them to the floor with carpet tape.  Make sure that you have a light switch at the top of the stairs and the bottom of the stairs. If you do not have them, ask someone to add them for you. What else can I do to help prevent falls?  Wear shoes that: ? Do not have high heels. ? Have rubber bottoms. ? Are comfortable and fit you well. ? Are closed at the toe. Do not wear sandals.  If you use a stepladder: ? Make sure that it is fully opened. Do not climb a closed stepladder. ? Make sure that both sides of the stepladder are locked into place. ? Ask someone to hold it for you, if possible.  Clearly mark and make sure that you can see: ? Any grab bars or handrails. ? First and last steps. ? Where the edge of each step is.  Use tools that help you move around (mobility aids) if they are needed. These  include: ? Canes. ? Walkers. ? Scooters. ? Crutches.  Turn on the lights when you go into a dark area. Replace any light bulbs as soon as they burn out.  Set up your furniture so you have a clear path. Avoid moving your furniture around.  If any of your floors are uneven, fix them.  If there are any pets around you, be aware of where they are.  Review your medicines with your doctor. Some medicines can make you feel dizzy. This can increase your chance of falling. Ask your doctor what other things that you can do to help prevent falls. This information is not intended to replace advice given to you by your health care provider. Make sure you discuss any questions you have with your health care provider. Document Released: 02/08/2009 Document Revised: 09/20/2015 Document Reviewed: 05/19/2014 Elsevier Interactive Patient Education  2018 [place] Maintenance, Male A healthy lifestyle and preventive care is important for your health and wellness. Ask your health care provider about what schedule of regular examinations is right for you. What should I know about weight and diet? Eat a Healthy Diet  Eat plenty of vegetables, fruits, whole grains, low-fat dairy products, and lean protein.  Do not eat a lot of foods high in solid fats, added sugars, or salt.  Maintain a Healthy Weight Regular exercise can help you achieve or maintain a healthy weight. You should:  Do at least 150 minutes of exercise each week. The exercise should increase your heart rate and make you sweat (moderate-intensity exercise).  Do strength-training exercises at least twice a week.  Watch Your Levels of Cholesterol and Blood Lipids  Have your blood tested for lipids and cholesterol every 5 years starting at 80 years of age. If you are at high risk for heart disease, you should start having your blood tested when you are 81 years old. You may need to have your cholesterol levels checked more  often if: ? Your lipid or cholesterol levels are high. ? You are older than 81 years of age. ? You are at high risk for heart disease.  What should I know about cancer screening? Many types of cancers can be detected early and may often be prevented. Lung Cancer  You should be screened every year for lung cancer if: ? You are a current smoker who has smoked for at least 30 years. ? You are a former smoker who has quit within the past 15 years.  Talk to your  health care provider about your screening options, when you should start screening, and how often you should be screened.  Colorectal Cancer  Routine colorectal cancer screening usually begins at 81 years of age and should be repeated every 5-10 years until you are 81 years old. You may need to be screened more often if early forms of precancerous polyps or small growths are found. Your health care provider may recommend screening at an earlier age if you have risk factors for colon cancer.  Your health care provider may recommend using home test kits to check for hidden blood in the stool.  A small camera at the end of a tube can be used to examine your colon (sigmoidoscopy or colonoscopy). This checks for the earliest forms of colorectal cancer.  Prostate and Testicular Cancer  Depending on your age and overall health, your health care provider may do certain tests to screen for prostate and testicular cancer.  Talk to your health care provider about any symptoms or concerns you have about testicular or prostate cancer.  Skin Cancer  Check your skin from head to toe regularly.  Tell your health care provider about any new moles or changes in moles, especially if: ? There is a change in a mole's size, shape, or color. ? You have a mole that is larger than a pencil eraser.  Always use sunscreen. Apply sunscreen liberally and repeat throughout the day.  Protect yourself by wearing long sleeves, pants, a wide-brimmed hat, and  sunglasses when outside.  What should I know about heart disease, diabetes, and high blood pressure?  If you are 74-52 years of age, have your blood pressure checked every 3-5 years. If you are 38 years of age or older, have your blood pressure checked every year. You should have your blood pressure measured twice-once when you are at a hospital or clinic, and once when you are not at a hospital or clinic. Record the average of the two measurements. To check your blood pressure when you are not at a hospital or clinic, you can use: ? An automated blood pressure machine at a pharmacy. ? A home blood pressure monitor.  Talk to your health care provider about your target blood pressure.  If you are between 42-53 years old, ask your health care provider if you should take aspirin to prevent heart disease.  Have regular diabetes screenings by checking your fasting blood sugar level. ? If you are at a normal weight and have a low risk for diabetes, have this test once every three years after the age of 18. ? If you are overweight and have a high risk for diabetes, consider being tested at a younger age or more often.  A one-time screening for abdominal aortic aneurysm (AAA) by ultrasound is recommended for men aged 58-75 years who are current or former smokers. What should I know about preventing infection? Hepatitis B If you have a higher risk for hepatitis B, you should be screened for this virus. Talk with your health care provider to find out if you are at risk for hepatitis B infection. Hepatitis C Blood testing is recommended for:  Everyone born from [birth year] through 1963.  Anyone with known risk factors for hepatitis C.  Sexually Transmitted Diseases (STDs)  You should be screened each year for STDs including gonorrhea and chlamydia if: ? You are sexually active and are younger than 81 years of age. ? You are older than 81 years of age and your health  care provider tells you that you are  at risk for this type of infection. ? Your sexual activity has changed since you were last screened and you are at an increased risk for chlamydia or gonorrhea. Ask your health care provider if you are at risk.  Talk with your health care provider about whether you are at high risk of being infected with HIV. Your health care provider may recommend a prescription medicine to help prevent HIV infection.  What else can I do?  Schedule regular health, dental, and eye exams.  Stay current with your vaccines (immunizations).  Do not use any tobacco products, such as cigarettes, chewing tobacco, and e-cigarettes. If you need help quitting, ask your health care provider.  Limit alcohol intake to no more than 2 drinks per day. One drink equals 12 ounces of beer, 5 ounces of wine, or 1 ounces of hard liquor.  Do not use street drugs.  Do not share needles.  Ask your health care provider for help if you need support or information about quitting drugs.  Tell your health care provider if you often feel depressed.  Tell your health care provider if you have ever been abused or do not feel safe at home. This information is not intended to replace advice given to you by your health care provider. Make sure you discuss any questions you have with your health care provider. Document Released: 10/11/2007 Document Revised: 12/12/2015 Document Reviewed: 01/16/2015 Elsevier Interactive Patient Education  [employer].

## 2016-10-01 NOTE — Progress Notes (Signed)
Patient ID: [name], male   DOB: 14-Aug-1928, 83 y.o.   MRN: [id number]     CARDIOLOGY OFFICE NOTE  Date:  10/06/2016    [name] Date of Birth: 1929-03-03 Medical Record #[id number]  PCP:  [name], DO  Cardiologist:  Johnsie Cancel    Chief Complaint  Patient presents with  . Coronary Artery Disease    History of Present Illness: [name] is a 83 y.o. male with a history of  CAD with prior CABG in 1999 - had SSCP in 2011 and required cath. All of his grafts were patent and his pain has not recurred.  CABG 1999: SVG: IM,OM1,OM2, SVG D1, SVG PDA/PLA and [place] to LAD EF was 45-50% with mild diffuse hypokinesis. His other issues include PVD, claudication, atrial fib/flutter on monitor back in 2014. Started on Eliquis. Apparently has some of his [place] labs abnormal and was asked to see hematology.  Successful Craig on  11/15/14   Last echo from 2014 with EF of 55 to 60%.   Last visit coreg added and hydralazine stopped  Home BP and HR readings fine   Goes to [employer]. Gets some exertional dyspnea. Also after working out gets "white flash" and pre syncope at times. This is usually before He takes his afternoon ACE  F/U myovue 08/28/15 scar no ischemia  Nuclear stress EF: 70%.  There was no ST segment deviation noted during stress.  Defect 1: There is a medium defect of moderate severity present in the basal inferolateral, mid inferolateral and apical lateral location.  Findings consistent with prior myocardial infarction with a small amount of peri-infarct ischemia.  This is a low risk study.  The left ventricular ejection fraction is hyperdynamic (>65%).  Duplex 09/03/15  1-39% ICA normal vertebral flow  ABI's normal 09/03/15 although toe pressures where in .4 range   Since last seen has seen Dr Raoul Pitch as primary and had anemia w/u Getting iron injections with Dr Marin Olp   Past Medical History:  Diagnosis Date  . Basal cell carcinoma    skin  . Bigeminal rhythm   . Cataract   . Coronary artery disease    post bypass  . CVD (cardiovascular disease)   . Diabetes mellitus without complication ([place])   . Diverticular disease   . Easy bruising   . 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 Gout   . Hernia   . Hyperlipidemia   . Hypertension   . Iron deficiency anemia 02/04/2016  . Macular degeneration   . Microscopic colitis   . PVD (peripheral vascular disease) ([place])     Past Surgical History:  Procedure Laterality Date  . CARDIAC CATHETERIZATION  2006  . CARDIOVERSION N/A 02/22/2013   Procedure: CARDIOVERSION;  Surgeon: [name], MD;  Location: [facility] ENDOSCOPY;  Service: Cardiovascular;  Laterality: N/A;  . CARDIOVERSION N/A 11/15/2014   Procedure: CARDIOVERSION;  Surgeon: [name], MD;  Location: [facility];  Service: Cardiovascular;  Laterality: N/A;  . CAROTID ENDARTERECTOMY  2009/ 1993   left/ right  . CORONARY ARTERY BYPASS GRAFT  1999  . EYE SURGERY     eyelid repair  . INGUINAL HERNIA REPAIR  02/11/2012   Procedure: LAPAROSCOPIC BILATERAL INGUINAL HERNIA REPAIR;  Surgeon: Pedro Earls, MD;  Location: [facility] ORS;  Service: General;  Laterality: Bilateral;  . SKIN CANCER EXCISION     right ear x 3     Medications: Current Outpatient Prescriptions  Medication Sig Dispense Refill  .  allopurinol (ZYLOPRIM) 300 MG tablet Take 0.5 tablets (150 mg total) by mouth daily. 45 tablet 3  . Aloe Vera 25 MG CAPS Take 2 capsules by mouth daily.    Marland Kitchen aspirin 81 MG tablet Take 81 mg by mouth at bedtime.     Marland Kitchen atorvastatin (LIPITOR) 20 MG tablet TAKE 1 TABLET AT BEDTIME 90 tablet 0  . carvedilol (COREG) 12.5 MG tablet TAKE 1 TABLET TWICE A DAY 180 tablet 1  . cholecalciferol (VITAMIN D) 1000 units tablet Take 5,000 Units by mouth daily.    . CHROMIUM GTF PO Take 2 capsules by mouth daily.    . Cinnamon 500 MG TABS Take 2 tablets by mouth at bedtime.     . cloNIDine (CATAPRES) 0.1 MG tablet Take 1 tablet  (0.1 mg total) by mouth 2 (two) times daily as needed. 180 tablet 1  . Coenzyme Q10 (CO Q 10) 100 MG CAPS Take 1 capsule by mouth daily.     Marland Kitchen DARBEPOETIN ALFA-ALBUMIN IJ Inject as directed.    Marland Kitchen ELIQUIS 2.5 MG TABS tablet TAKE 1 TABLET TWICE A DAY 180 tablet 1  . fish oil-omega-3 fatty acids 1000 MG capsule Take 1 g by mouth daily.     Marland Kitchen glucose blood (PRODIGY NO CODING BLOOD GLUC) test strip Use as instructed once daily 100 each 12  . lisinopril (PRINIVIL,ZESTRIL) 40 MG tablet Take 1 tablet (40 mg total) by mouth daily. 90 tablet 0  . Multiple Vitamins-Minerals (PRESERVISION AREDS PO) Take 1 tablet by mouth 2 (two) times daily.     . nitroGLYCERIN (NITROSTAT) 0.4 MG SL tablet Place 0.4 mg under the tongue every 5 (five) minutes as needed (up to 3 doses only). For chest pain    . Probiotic Product (PROBIOTIC PO) Take 1 tablet by mouth daily.    Marland Kitchen PRODIGY SAFETY LANCETS 26G MISC Check fasting glucose once daily 100 each 1  . tamsulosin (FLOMAX) 0.4 MG CAPS capsule Take 1 capsule (0.4 mg total) by mouth daily after supper. 90 capsule 1  . VANADYL SULFATE PO Take 1 capsule by mouth daily.    Marland Kitchen zinc gluconate 50 MG tablet Take 50 mg by mouth daily.      No current facility-administered medications for this visit.     Allergies: Allergies  Allergen Reactions  . Penicillins Hives    Social History: The patient  reports that he quit smoking about 54 years ago. He quit smokeless tobacco use about 47 years ago. His smokeless tobacco use included Chew. He reports that he does not drink alcohol or use drugs.   Family History: The patient's family history includes Coronary artery disease in his father; Heart disease in his father; Melanoma in his sister; Stroke in his mother.   Review of Systems: Please see the history of present illness.   Otherwise, the review of systems is positive for dizziness, easy bruising, skipped heart beats and palpitations.   All other systems are reviewed and  negative.   Physical Exam: VS:  BP (!) 160/50   Pulse 64   Ht 5\' 8"  (1.727 m)   Wt 69.4 kg (153 lb)   SpO2 98%   BMI 23.26 kg/m  .  BMI Body mass index is 23.26 kg/m.  Wt Readings from Last 3 Encounters:  10/06/16 69.4 kg (153 lb)  09/30/16 69.5 kg (153 lb 1.9 oz)  09/23/16 68.9 kg (152 lb)    Affect appropriate Healthy:  appears stated age 56: normal Neck supple with  no adenopathy JVP normal no bruits no thyromegaly Lungs clear with no wheezing and good diaphragmatic motion Heart:  S1/S2 no murmur, no rub, gallop or click PMI normal Abdomen: benighn, BS positve, no tenderness, no AAA no bruit.  No HSM or HJR Distal pulses intact with no bruits No edema Neuro non-focal Skin warm and dry No muscular weakness    LABORATORY DATA:  EKG:  EKG 2014   atrial fibrillation with slow VR - HR is 54.  12/21/14  SR ate 89  PVC  LPFB inferior T wave changes LVH  Lab Results  Component Value Date   WBC 8.6 09/18/2016   HGB 10.7 (L) 09/18/2016   HCT 31.9 (L) 09/18/2016   PLT 192 09/18/2016   GLUCOSE 111 (H) 09/23/2016   CHOL 112 06/09/2013   TRIG 69.0 06/09/2013   HDL 43.20 06/09/2013   LDLCALC 55 06/09/2013   ALT 32 09/18/2016   AST 29 09/18/2016   NA 141 09/23/2016   K 4.9 09/23/2016   CL 116 (H) 09/23/2016   CREATININE 1.98 (H) 09/23/2016   BUN 36 (H) 09/23/2016   CO2 16 (L) 09/23/2016   TSH 1.85 12/26/2015   INR 1.2 (H) 11/13/2014   HGBA1C 5.7 12/18/2015   MICROALBUR 0.8 12/26/2015    BNP (last 3 results) No results for input(s): BNP in the last 8760 hours.  ProBNP (last 3 results) No results for input(s): PROBNP in the last 8760 hours.   Other Studies Reviewed Today:   Assessment/Plan: 1. Afib:  Initially 2014  Recurrent with successful [facility] November 15 2014  Continue eliquis  Given age and renal function he is on lower dose Continue beta blocker   2. HTN - BP fair - better control. Hydralazine stopped and on lisinopril and coreg   3. PVD  Medial  calcinosis ABI's over 1 in  08/2015  TBI in .4 range slightly abnormal On exam he has abdominal and bilateral femoral bruits observe   4. CAD - CABG 1999 grafts patent cath 2011  myovue scar no ischemia Given age CRF and lack of ischemia no chest pain no indication for cath at this time   5. PreSyncope:  Not postural in office. Doubt low BP.  Post bilateral CEA duplex with no stenosis   Avoid diuretics for BP control Avoid NSAI's  Have adjusted dose of NOAC   6. Anemia: improved continue f/u Dr Martha Clan on iv iron   Enjoys seeing DR [facility]oul [facility] at [facility]   F/u with me in 6 months   Jenkins Rouge

## 2016-10-02 ENCOUNTER — Other Ambulatory Visit: Payer: Self-pay

## 2016-10-02 ENCOUNTER — Ambulatory Visit (HOSPITAL_COMMUNITY)
Admission: RE | Admit: 2016-10-02 | Discharge: 2016-10-02 | Disposition: A | Discharge status: Home / Self Care | Payer: Medicare Other | Source: Ambulatory Visit | Attending: Cardiovascular Disease | Admitting: Cardiovascular Disease

## 2016-10-02 DIAGNOSIS — E1151 Type 2 diabetes mellitus with diabetic peripheral angiopathy without gangrene: Secondary | ICD-10-CM | POA: Diagnosis not present

## 2016-10-02 DIAGNOSIS — I6523 Occlusion and stenosis of bilateral carotid arteries: Secondary | ICD-10-CM | POA: Diagnosis not present

## 2016-10-02 DIAGNOSIS — I2510 Atherosclerotic heart disease of native coronary artery without angina pectoris: Secondary | ICD-10-CM | POA: Insufficient documentation

## 2016-10-02 DIAGNOSIS — I10 Essential (primary) hypertension: Secondary | ICD-10-CM | POA: Diagnosis not present

## 2016-10-02 DIAGNOSIS — Z951 Presence of aortocoronary bypass graft: Secondary | ICD-10-CM | POA: Diagnosis not present

## 2016-10-02 DIAGNOSIS — E785 Hyperlipidemia, unspecified: Secondary | ICD-10-CM | POA: Insufficient documentation

## 2016-10-02 DIAGNOSIS — I739 Peripheral vascular disease, unspecified: Secondary | ICD-10-CM

## 2016-10-02 DIAGNOSIS — Z87891 Personal history of nicotine dependence: Secondary | ICD-10-CM | POA: Diagnosis not present

## 2016-10-02 DIAGNOSIS — I779 Disorder of arteries and arterioles, unspecified: Secondary | ICD-10-CM

## 2016-10-02 MED ORDER — LISINOPRIL 40 MG PO TABS: 40.0000 mg | ORAL_TABLET | Freq: Every day | ORAL | 0 refills | Status: DC

## 2016-10-02 NOTE — Telephone Encounter (Signed)
Refill sent on Lisinopril.

## 2016-10-03 ENCOUNTER — Telehealth: Payer: Self-pay | Admitting: Cardiovascular Disease

## 2016-10-03 NOTE — Telephone Encounter (Signed)
Pt states he is returning a call from Pam-pls return call

## 2016-10-03 NOTE — Telephone Encounter (Signed)
Called patient with carotid results. Patient aware of results.

## 2016-10-06 ENCOUNTER — Encounter: Payer: Self-pay | Admitting: Cardiovascular Disease

## 2016-10-06 ENCOUNTER — Ambulatory Visit (INDEPENDENT_AMBULATORY_CARE_PROVIDER_SITE_OTHER): Payer: Medicare Other | Admitting: Cardiovascular Disease

## 2016-10-06 VITALS — BP 140/60 | HR 64 | Ht 68.0 in | Wt 153.0 lb

## 2016-10-06 DIAGNOSIS — I2510 Atherosclerotic heart disease of native coronary artery without angina pectoris: Secondary | ICD-10-CM | POA: Diagnosis not present

## 2016-10-06 DIAGNOSIS — I2583 Coronary atherosclerosis due to lipid rich plaque: Secondary | ICD-10-CM | POA: Diagnosis not present

## 2016-10-06 NOTE — Patient Instructions (Signed)
Your physician recommends that you continue on your current medications as directed. Please refer to the Current Medication list given to you today.   Your physician wants you to follow-up in:  6 MONTHS WITH DR NISHAN  You will receive a reminder letter in the mail two months in advance. If you don't receive a letter, please call our office to schedule the follow-up appointment. 

## 2016-10-16 ENCOUNTER — Ambulatory Visit (HOSPITAL_BASED_OUTPATIENT_CLINIC_OR_DEPARTMENT_OTHER): Payer: Medicare Other | Admitting: Hematology & Oncology

## 2016-10-16 ENCOUNTER — Other Ambulatory Visit (HOSPITAL_BASED_OUTPATIENT_CLINIC_OR_DEPARTMENT_OTHER): Payer: Medicare Other

## 2016-10-16 ENCOUNTER — Telehealth: Payer: Self-pay | Admitting: *Deleted

## 2016-10-16 ENCOUNTER — Ambulatory Visit: Payer: Medicare Other

## 2016-10-16 VITALS — BP 186/64 | HR 63 | Temp 97.6°F | Resp 19 | Wt 151.0 lb

## 2016-10-16 DIAGNOSIS — D509 Iron deficiency anemia, unspecified: Secondary | ICD-10-CM

## 2016-10-16 DIAGNOSIS — D631 Anemia in chronic kidney disease: Secondary | ICD-10-CM | POA: Diagnosis not present

## 2016-10-16 DIAGNOSIS — N189 Chronic kidney disease, unspecified: Secondary | ICD-10-CM

## 2016-10-16 DIAGNOSIS — D500 Iron deficiency anemia secondary to blood loss (chronic): Secondary | ICD-10-CM

## 2016-10-16 LAB — CMP (CANCER CENTER ONLY)
ALT(SGPT): 35 U/L (ref 10–47)
AST: 36 U/L (ref 11–38)
Albumin: 3.4 g/dL (ref 3.3–5.5)
Alkaline Phosphatase: 66 U/L (ref 26–84)
BUN: 24 mg/dL — AB (ref 7–22)
CHLORIDE: 111 meq/L — AB (ref 98–108)
CO2: 22 meq/L (ref 18–33)
CREATININE: 1.6 mg/dL — AB (ref 0.6–1.2)
Calcium: 8.7 mg/dL (ref 8.0–10.3)
GLUCOSE: 105 mg/dL (ref 73–118)
POTASSIUM: 4.3 meq/L (ref 3.3–4.7)
SODIUM: 140 meq/L (ref 128–145)
Total Bilirubin: 0.9 mg/dl (ref 0.20–1.60)
Total Protein: 6.7 g/dL (ref 6.4–8.1)

## 2016-10-16 LAB — CBC WITH DIFFERENTIAL (CANCER CENTER ONLY)
BASO#: 0 10*3/uL (ref 0.0–0.2)
BASO%: 0.2 % (ref 0.0–2.0)
EOS ABS: 0.1 10*3/uL (ref 0.0–0.5)
EOS%: 2.2 % (ref 0.0–7.0)
HCT: 39.3 % (ref 38.7–49.9)
HGB: 13.1 g/dL (ref 13.0–17.1)
LYMPH#: 1.8 10*3/uL (ref 0.9–3.3)
LYMPH%: 28.3 % (ref 14.0–48.0)
MCH: 31.4 pg (ref 28.0–33.4)
MCHC: 33.3 g/dL (ref 32.0–35.9)
MCV: 94 fL (ref 82–98)
MONO#: 0.7 10*3/uL (ref 0.1–0.9)
MONO%: 11.3 % (ref 0.0–13.0)
NEUT#: 3.6 10*3/uL (ref 1.5–6.5)
NEUT%: 58 % (ref 40.0–80.0)
PLATELETS: 167 10*3/uL (ref 145–400)
RBC: 4.17 10*6/uL — AB (ref 4.20–5.70)
RDW: 14.7 % (ref 11.1–15.7)
WBC: 6.3 10*3/uL (ref 4.0–10.0)

## 2016-10-16 LAB — IRON AND TIBC
%SAT: 28 % (ref 20–55)
Iron: 62 ug/dL (ref 42–163)
TIBC: 220 ug/dL (ref 202–409)
UIBC: 158 ug/dL (ref 117–376)

## 2016-10-16 LAB — FERRITIN: Ferritin: 378 ng/ml — ABNORMAL HIGH (ref 22–316)

## 2016-10-16 NOTE — Telephone Encounter (Addendum)
Patient is aware of results  ----- Message from [name], MD sent at 10/16/2016  1:29 PM EDT ----- Call - the iron level is perfect!!  pete

## 2016-10-16 NOTE — Progress Notes (Signed)
Hematology and Oncology Follow Up Visit  [name] [id number] [birth year] 83 y.o. 10/16/2016   Principle Diagnosis:   Multifactorial anemia  Erythropoietin deficiency anemia  Iron deficiency anemia  Current Therapy:    IV iron as indicated  Aranesp 300 g subcutaneous as needed for hemoglobin less than 11     Interim History:  [name] is back for follow-up. He is doing so well right now.  He had no problems since we last saw him.  We last saw him, his renal function looked okay. His ferritin was 63 with an iron saturation of 63%.  He has had no problems with bleeding. He is on ELIQUIS.  He's had no nausea or vomiting. He's had no leg swelling. He is had no rashes.  I see him in church every Sunday. He is part of our church choir. He has a very nice voice.  Overall, his performance status is ECOG 1.  Medications:  Current Outpatient Prescriptions:  .  allopurinol (ZYLOPRIM) 300 MG tablet, Take 0.5 tablets (150 mg total) by mouth daily., Disp: 45 tablet, Rfl: 3 .  Aloe Vera 25 MG CAPS, Take 2 capsules by mouth daily., Disp: , Rfl:  .  aspirin 81 MG tablet, Take 81 mg by mouth at bedtime. , Disp: , Rfl:  .  atorvastatin (LIPITOR) 20 MG tablet, TAKE 1 TABLET AT BEDTIME, Disp: 90 tablet, Rfl: 0 .  carvedilol (COREG) 12.5 MG tablet, TAKE 1 TABLET TWICE A DAY, Disp: 180 tablet, Rfl: 1 .  cholecalciferol (VITAMIN D) 1000 units tablet, Take 5,000 Units by mouth daily., Disp: , Rfl:  .  CHROMIUM GTF PO, Take 2 capsules by mouth daily., Disp: , Rfl:  .  Cinnamon 500 MG TABS, Take 2 tablets by mouth at bedtime. , Disp: , Rfl:  .  cloNIDine (CATAPRES) 0.1 MG tablet, Take 1 tablet (0.1 mg total) by mouth 2 (two) times daily as needed., Disp: 180 tablet, Rfl: 1 .  Coenzyme Q10 (CO Q 10) 100 MG CAPS, Take 1 capsule by mouth daily. , Disp: , Rfl:  .  DARBEPOETIN ALFA-ALBUMIN IJ, Inject as directed., Disp: , Rfl:  .  ELIQUIS 2.5 MG TABS tablet, TAKE 1 TABLET TWICE A DAY, Disp:  180 tablet, Rfl: 1 .  fish oil-omega-3 fatty acids 1000 MG capsule, Take 1 g by mouth daily. , Disp: , Rfl:  .  glucose blood (PRODIGY NO CODING BLOOD GLUC) test strip, Use as instructed once daily, Disp: 100 each, Rfl: 12 .  lisinopril (PRINIVIL,ZESTRIL) 40 MG tablet, Take 1 tablet (40 mg total) by mouth daily., Disp: 90 tablet, Rfl: 0 .  Multiple Vitamins-Minerals (PRESERVISION AREDS PO), Take 1 tablet by mouth 2 (two) times daily. , Disp: , Rfl:  .  nitroGLYCERIN (NITROSTAT) 0.4 MG SL tablet, Place 0.4 mg under the tongue every 5 (five) minutes as needed (up to 3 doses only). For chest pain, Disp: , Rfl:  .  Probiotic Product (PROBIOTIC PO), Take 1 tablet by mouth daily., Disp: , Rfl:  .  PRODIGY SAFETY LANCETS 26G MISC, Check fasting glucose once daily, Disp: 100 each, Rfl: 1 .  tamsulosin (FLOMAX) 0.4 MG CAPS capsule, Take 1 capsule (0.4 mg total) by mouth daily after supper., Disp: 90 capsule, Rfl: 1 .  VANADYL SULFATE PO, Take 1 capsule by mouth daily., Disp: , Rfl:  .  zinc gluconate 50 MG tablet, Take 50 mg by mouth daily. , Disp: , Rfl:   Allergies:  Allergies  Allergen Reactions  .  Penicillins Hives    Past Medical History, Surgical history, Social history, and Family History were reviewed and updated.  Review of Systems:  As above  Physical Exam:  weight is 151 lb (68.5 kg). His oral temperature is 97.6 F (36.4 C). His blood pressure is 186/64 (abnormal) and his pulse is 63. His respiration is 19 and oxygen saturation is 100%.   Wt Readings from Last 3 Encounters:  10/16/16 151 lb (68.5 kg)  10/06/16 153 lb (69.4 kg)  09/30/16 153 lb 1.9 oz (69.5 kg)      Elderly white male in no obvious distress. Head exam shows no ocular or oral lesions. There are no palpable cervical or supraclavicular lymph nodes. Lungs are clear. Cardiac exam regular rate and rhythm with an occasional extra beat. He has 1/6 systolic murmur. Abdomen is soft. He has good bowel sounds. There is no  fluid wave. There is no palpable liver or spleen tip. Back exam shows no tenderness over the spine, ribs or hips. Extremities show some trace edema in his legs. Skin exam shows no rashes, ecchymoses or petechia. Neurological exam shows no focal neurological deficits.  Lab Results  Component Value Date   WBC 6.3 10/16/2016   HGB 13.1 10/16/2016   HCT 39.3 10/16/2016   MCV 94 10/16/2016   PLT 167 10/16/2016     Chemistry      Component Value Date/Time   NA 140 10/16/2016 0909   NA 140 03/28/2016 0853   K 4.3 10/16/2016 0909   K 4.4 03/28/2016 0853   CL 111 (H) 10/16/2016 0909   CO2 22 10/16/2016 0909   CO2 19 (L) 03/28/2016 0853   BUN 24 (H) 10/16/2016 0909   BUN 34.4 (H) 03/28/2016 0853   CREATININE 1.6 (H) 10/16/2016 0909   CREATININE 1.7 (H) 03/28/2016 0853   GLU 113 07/24/2016      Component Value Date/Time   CALCIUM 8.7 10/16/2016 0909   CALCIUM 9.2 03/28/2016 0853   ALKPHOS 66 10/16/2016 0909   ALKPHOS 85 03/28/2016 0853   AST 36 10/16/2016 0909   AST 23 03/28/2016 0853   ALT 35 10/16/2016 0909   ALT 37 03/28/2016 0853   BILITOT 0.90 10/16/2016 0909   BILITOT 0.79 03/28/2016 0853         Impression and Plan: [name] is a 83 year old white male with multifactorial anemia.   He really looks fantastic today. This is the best that his blood count has been area  I told him that he must stay well hydrated. His kidney function is better. I think that it is no questions that his hemoglobin is so high and his renal function is much better than when we last saw him.   I think that we can see him back in 3 months. I think this would be reasonable. I see him in church every Sunday so if there are are any issues, he will me know then.    [name], MD 6/21/201810:25 AM'

## 2016-10-17 LAB — RETICULOCYTES: Reticulocyte Count: 1.4 % (ref 0.6–2.6)

## 2016-10-30 ENCOUNTER — Other Ambulatory Visit: Payer: Self-pay | Admitting: Family Medicine

## 2016-11-20 ENCOUNTER — Encounter: Payer: Self-pay | Admitting: Family Medicine

## 2016-11-20 ENCOUNTER — Ambulatory Visit (INDEPENDENT_AMBULATORY_CARE_PROVIDER_SITE_OTHER): Payer: Medicare Other | Admitting: Family Medicine

## 2016-11-20 VITALS — BP 124/56 | HR 53 | Temp 97.5°F | Resp 16 | Wt 148.0 lb

## 2016-11-20 DIAGNOSIS — R197 Diarrhea, unspecified: Secondary | ICD-10-CM | POA: Diagnosis not present

## 2016-11-20 LAB — CBC WITH DIFFERENTIAL/PLATELET
BASOS ABS: 0 10*3/uL (ref 0.0–0.1)
Basophils Relative: 0.3 % (ref 0.0–3.0)
Eosinophils Absolute: 0.2 10*3/uL (ref 0.0–0.7)
Eosinophils Relative: 2.4 % (ref 0.0–5.0)
HEMATOCRIT: 40 % (ref 39.0–52.0)
Hemoglobin: 13 g/dL (ref 13.0–17.0)
LYMPHS PCT: 24.4 % (ref 12.0–46.0)
Lymphs Abs: 1.9 10*3/uL (ref 0.7–4.0)
MCHC: 32.4 g/dL (ref 30.0–36.0)
MCV: 94.3 fl (ref 78.0–100.0)
Monocytes Absolute: 0.6 10*3/uL (ref 0.1–1.0)
Monocytes Relative: 7.1 % (ref 3.0–12.0)
NEUTROS ABS: 5.2 10*3/uL (ref 1.4–7.7)
Neutrophils Relative %: 65.8 % (ref 43.0–77.0)
PLATELETS: 202 10*3/uL (ref 150.0–400.0)
RBC: 4.24 Mil/uL (ref 4.22–5.81)
RDW: 15.4 % (ref 11.5–15.5)
WBC: 7.8 10*3/uL (ref 4.0–10.5)

## 2016-11-20 MED ORDER — DICYCLOMINE HCL 10 MG PO CAPS: 10.0000 mg | ORAL_CAPSULE | Freq: Three times a day (TID) | ORAL | 0 refills | Status: DC

## 2016-11-20 NOTE — Patient Instructions (Signed)
Make sure to stay hydrated with water and G2. Start bentyl 30 minutes before the two largest meals of the day.   Collect stool studies and drop off so we can test further.  AFter I get the blood results back we can decide on need of antibiotic or antidiarrheal etc  Start probiotic call ALIGN daily for 8-12 weeks. This is over the counter.     Chronic Diarrhea Diarrhea is a condition in which a person passes frequent loose and watery stools. It can cause you to feel weak and dehydrated. Dehydration can make you tired and thirsty. It can also cause a dry mouth, decreased urination, and dark yellow urine. Diarrhea is a sign of another underlying problem, such as:  Infection.  Medication side effects.  Dietary intolerance, such as lactose intolerance.  Conditions such as celiac disease, irritable bowel syndrome (IBS), or inflammatory bowel disease (IBD).  In most cases, diarrhea lasts 2-3 days. Diarrhea that lasts longer than 4 weeks is called long-lasting (chronic) diarrhea. It is important that you treat your diarrhea as told by your health care provider. Follow these instructions at home: Follow these recommendations as told by your health care provider. Eating and drinking  Take an oral rehydration solution (ORS). This is a drink that is designed to keep you hydrated. It can be found at pharmacies and retail stores.  Drink clear fluids, such as water, ice chips, diluted fruit juice, and low-calorie sports drinks.  Follow the diet recommended by your health care provider. You may need to avoid foods that trigger diarrhea for you.  Avoid foods and beverages that contain a lot of sugar or caffeine.  Avoid alcohol.  Avoid spicy or fatty foods. General instructions  Drink enough fluid to keep your urine clear or pale yellow.  Wash your hands often and after each diarrhea episode. If soap and water are not available, use hand sanitizer.  Make sure that all people in your  household wash their hands well and often.  Take over-the-counter and prescription medicines only as told by your health care provider.  If you were prescribed an antibiotic medicine, take it as told by your health care provider. Do not stop taking the antibiotic even if you start to feel better.  Rest at home while you recover.  Watch your condition for any changes.  Take a warm bath to relieve any burning or pain from frequent diarrhea episodes.  Keep all follow-up visits as told by your health care provider. This is important. Contact a health care provider if:  You have a fever.  Your diarrhea gets worse or does not get better.  You have new symptoms.  You cannot drink fluids without vomiting.  You feel light-headed or dizzy.  You have a headache.  You have muscle cramps.  You have severe pain in the rectum. Get help right away if:  You have persistent vomiting.  You have chest pain.  You feel extremely weak or you faint.  You have bloody or black stools, or stools that look like tar.  You have severe pain, cramping, or bloating in your abdomen, or pain that stays in one place.  You have trouble breathing or you are breathing very quickly.  Your heart is beating very quickly.  Your skin feels cold and clammy.  You feel confused.  You have a severe headache.  You have signs of dehydration, such as: ? Dark urine, very little urine, or no urine. ? Cracked lips. ? Dry mouth. ?  Sunken eyes. ? Sleepiness. ? Weakness. Summary  Chronic diarrhea is a condition in which a person passes frequent loose and watery stools for more than 4 weeks.  Diarrhea is a sign of another underlying problem.  Drink enough fluid to keep your urine clear or pale yellow to avoid dehydration.  Wash your hands often and after each diarrhea episode. If soap and water are not available, use hand sanitizer.  It is important that you treat your diarrhea as told by your health care  provider. This information is not intended to replace advice given to you by your health care provider. Make sure you discuss any questions you have with your health care provider. Document Released: 07/05/2003 Document Revised: 03/03/2016 Document Reviewed: 03/03/2016 Elsevier Interactive Patient Education  2017 [employer].

## 2016-11-20 NOTE — Progress Notes (Signed)
[name] , October 17, 1928, 83 y.o., male MRN: [id number] Patient Care Team    Relationship Specialty Notifications Start End  [name], DO PCP - General Family Medicine  12/18/15   [name], MD Consulting Physician Ophthalmology  12/18/15   [name], MD Consulting Physician Dermatology  12/18/15   [name], MD Consulting Physician Cardiology  12/18/15   [name], MD Consulting Physician Ophthalmology  12/19/15   [name], MD Consulting Physician Oncology  09/30/16   [name], MD Attending Physician Nephrology  09/30/16   [name], MD Consulting Physician Dermatology  09/30/16     Chief Complaint  Patient presents with  . Diarrhea    x 3 weeks      Subjective: Diarrhea: Patient presents today with new complaint of diarrhea, happens multiple times a day, typically after a meal for the past 3 weeks. He feels a cramping and swishing in his abdomen, and then will need to have a bowel movement. He denies fever, chills, nausea, vomiting, rash, fecal incontinence or nighttime symptoms. He is complaining of feeling weak. He states that his stools are mostly loose, but at times there are watery in nature. He denies any melena/hematochezia or foul odor. He denies any major changes to his diet, other than eating more vegetables now that they are in season. He denies any recent travel outside the [place] or camping. He does not recall eating any under prepared meals. He has not been on any recent antibiotics. No one else in the household is sick. He does admit to having a similar episode a few years ago, that resolved on its own. He states he doesn't think they ever figured out what caused it. He has continued to take the lisinopril during his 3 weeks of diarrhea. He did try Imodium, that slowed down, but then it returned.   Depression screen [facility] 2/9 09/30/2016 12/18/2015  Decreased Interest 0 0  Down, Depressed, Hopeless 0 0  PHQ - 2 Score 0 0     Allergies  Allergen Reactions  . Penicillins Hives   Social History  Substance Use Topics  . Smoking status: Former Smoker    Quit date: 12/25/1961  . Smokeless tobacco: Former Systems developer    Types: Chew    Quit date: 02/03/1969  . Alcohol use No   Past Medical History:  Diagnosis Date  . Basal cell carcinoma    skin  . Bigeminal rhythm   . Cataract   . Coronary artery disease    post bypass  . CVD (cardiovascular disease)   . Diabetes mellitus without complication ([place])   . Diverticular disease   . Easy bruising   . 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 Gout   . Hernia   . Hyperlipidemia   . Hypertension   . Iron deficiency anemia 02/04/2016  . Macular degeneration   . Microscopic colitis   . PVD (peripheral vascular disease) ([place])    Past Surgical History:  Procedure Laterality Date  . CARDIAC CATHETERIZATION  2006  . CARDIOVERSION N/A 02/22/2013   Procedure: CARDIOVERSION;  Surgeon: [name], MD;  Location: [name] ENDOSCOPY;  Service: Cardiovascular;  Laterality: N/A;  . CARDIOVERSION N/A 11/15/2014   Procedure: CARDIOVERSION;  Surgeon: [name], MD;  Location: [facility];  Service: Cardiovascular;  Laterality: N/A;  . CAROTID ENDARTERECTOMY  2009/ 1993   left/ right  . CORONARY ARTERY BYPASS GRAFT  1999  . EYE SURGERY  eyelid repair  . INGUINAL HERNIA REPAIR  02/11/2012   Procedure: LAPAROSCOPIC BILATERAL INGUINAL HERNIA REPAIR;  Surgeon: Pedro Earls, MD;  Location: [facility] ORS;  Service: General;  Laterality: Bilateral;  . SKIN CANCER EXCISION     right ear x 3   Family History  Problem Relation Age of Onset  . Stroke Mother   . Coronary artery disease Father   . Heart disease Father   . Melanoma Sister    Allergies as of 11/20/2016      Reactions   Penicillins Hives      Medication List       Accurate as of 11/20/16 10:00 AM. Always use your most recent med list.          allopurinol 300 MG tablet Commonly known as:   ZYLOPRIM Take 0.5 tablets (150 mg total) by mouth daily.   Aloe Vera 25 MG Caps Take 2 capsules by mouth daily.   aspirin 81 MG tablet Take 81 mg by mouth at bedtime.   atorvastatin 20 MG tablet Commonly known as:  LIPITOR TAKE 1 TABLET AT BEDTIME   carvedilol 12.5 MG tablet Commonly known as:  COREG TAKE 1 TABLET TWICE A DAY   cholecalciferol 1000 units tablet Commonly known as:  VITAMIN D Take 5,000 Units by mouth daily.   CHROMIUM GTF PO Take 2 capsules by mouth daily.   Cinnamon 500 MG Tabs Take 2 tablets by mouth at bedtime.   cloNIDine 0.1 MG tablet Commonly known as:  CATAPRES Take 1 tablet (0.1 mg total) by mouth 2 (two) times daily as needed.   Co Q 10 100 MG Caps Take 1 capsule by mouth daily.   DARBEPOETIN ALFA-ALBUMIN IJ Inject as directed.   ELIQUIS 2.5 MG Tabs tablet Generic drug:  apixaban TAKE 1 TABLET TWICE A DAY   fish oil-omega-3 fatty acids 1000 MG capsule Take 1 g by mouth daily.   glucose blood test strip Commonly known as:  PRODIGY NO CODING BLOOD GLUC Use as instructed once daily   lisinopril 40 MG tablet Commonly known as:  PRINIVIL,ZESTRIL Take 1 tablet (40 mg total) by mouth daily.   nitroGLYCERIN 0.4 MG SL tablet Commonly known as:  NITROSTAT Place 0.4 mg under the tongue every 5 (five) minutes as needed (up to 3 doses only). For chest pain   PRESERVISION AREDS PO Take 1 tablet by mouth 2 (two) times daily.   PROBIOTIC PO Take 1 tablet by mouth daily.   PRODIGY SAFETY LANCETS 26G Misc Check fasting glucose once daily   tamsulosin 0.4 MG Caps capsule Commonly known as:  FLOMAX Take 1 capsule (0.4 mg total) by mouth daily after supper.   VANADYL SULFATE PO Take 1 capsule by mouth daily.   zinc gluconate 50 MG tablet Take 50 mg by mouth daily.       All past medical history, surgical history, allergies, family history, immunizations andmedications were updated in the EMR today and reviewed under the history and  medication portions of their EMR.     ROS: Negative, with the exception of above mentioned in HPI  Objective:  BP (!) 124/56 (BP Location: Left Arm, Patient Position: Sitting, Cuff Size: Normal)   Pulse (!) 53   Temp (!) 97.5 F (36.4 C) (Oral)   Resp 16   Wt 148 lb (67.1 kg)   SpO2 99%   BMI 22.50 kg/m  Body mass index is 22.5 kg/m. Gen: Afebrile. No acute distress. Nontoxic in appearance, well developed,  well nourished. Mildly pale. HENT: AT. [place]. Tacky mucous membranes.  Eyes:Pupils Equal Round Reactive to light, Extraocular movements intact,  Conjunctiva without redness, discharge or icterus. Neck/lymp/endocrine: Supple, no lymphadenopathy CV: RRR, no edema Chest: CTAB, no wheeze or crackles. Good air movement, normal resp effort.  Abd: Soft. Flat. NTND. BS present and mildly hyperactive. No Masses palpated. No rebound or guarding.  Skin: No rashes, purpura or petechiae.  Neuro:  Normal gait. PERLA. EOMi. Alert. Oriented x3   No exam data present No results found. No results found for this or any previous visit (from the past 24 hour(s)).  Assessment/Plan: [name] is a 83 y.o. male present for OV for  Diarrhea, unspecified type - Patient encouraged to rest, keep up with hydration. He was advised to monitor his urine output, making sure urinates it very light yellow. The darker urine than where he needs to drink. Advised him to drink water and G2. - Collected CBC and CMP today. Stool culture and PCR ordered. - Patient encouraged to start a probiotic (ALIGN) daily for 8-12 weeks - Bentyl prescribed with tapering instructions. - dicyclomine (BENTYL) 10 MG capsule; Take 1 capsule (10 mg total) by mouth 4 (four) times daily -  before meals and at bedtime.  Dispense: 120 capsule; Refill: 0 - CBC w/Diff - COMPLETE METABOLIC PANEL WITH GFR - Stool Culture; Future - Clostridium Difficile by PCR; Future - Follow-up dependent upon lab results, close follow-up  desired.   Reviewed expectations re: course of current medical issues.  Discussed self-management of symptoms.  Outlined signs and symptoms indicating need for more acute intervention.  Patient verbalized understanding and all questions were answered.  Patient received an After-Visit Summary.    No orders of the defined types were placed in this encounter.    Note is dictated utilizing voice recognition software. Although note has been proof read prior to signing, occasional typographical errors still can be missed. If any questions arise, please do not hesitate to call for verification.   electronically signed by:  Howard Pouch, DO  [place]

## 2016-11-21 ENCOUNTER — Telehealth: Payer: Self-pay | Admitting: Family Medicine

## 2016-11-21 ENCOUNTER — Emergency Department (HOSPITAL_BASED_OUTPATIENT_CLINIC_OR_DEPARTMENT_OTHER)
Admission: EM | Admit: 2016-11-21 | Discharge: 2016-11-21 | Disposition: A | Discharge status: Home / Self Care | Payer: Medicare Other | Attending: Emergency Medicine | Admitting: Emergency Medicine

## 2016-11-21 ENCOUNTER — Encounter (HOSPITAL_BASED_OUTPATIENT_CLINIC_OR_DEPARTMENT_OTHER): Payer: Self-pay

## 2016-11-21 DIAGNOSIS — E119 Type 2 diabetes mellitus without complications: Secondary | ICD-10-CM | POA: Diagnosis not present

## 2016-11-21 DIAGNOSIS — Z7901 Long term (current) use of anticoagulants: Secondary | ICD-10-CM | POA: Diagnosis not present

## 2016-11-21 DIAGNOSIS — Z87891 Personal history of nicotine dependence: Secondary | ICD-10-CM | POA: Insufficient documentation

## 2016-11-21 DIAGNOSIS — Z7982 Long term (current) use of aspirin: Secondary | ICD-10-CM | POA: Diagnosis not present

## 2016-11-21 DIAGNOSIS — Z992 Dependence on renal dialysis: Secondary | ICD-10-CM | POA: Insufficient documentation

## 2016-11-21 DIAGNOSIS — I2510 Atherosclerotic heart disease of native coronary artery without angina pectoris: Secondary | ICD-10-CM | POA: Diagnosis not present

## 2016-11-21 DIAGNOSIS — I10 Essential (primary) hypertension: Secondary | ICD-10-CM | POA: Diagnosis not present

## 2016-11-21 DIAGNOSIS — Z951 Presence of aortocoronary bypass graft: Secondary | ICD-10-CM | POA: Diagnosis not present

## 2016-11-21 DIAGNOSIS — Z79899 Other long term (current) drug therapy: Secondary | ICD-10-CM | POA: Insufficient documentation

## 2016-11-21 DIAGNOSIS — R197 Diarrhea, unspecified: Secondary | ICD-10-CM | POA: Diagnosis not present

## 2016-11-21 DIAGNOSIS — R899 Unspecified abnormal finding in specimens from other organs, systems and tissues: Secondary | ICD-10-CM | POA: Insufficient documentation

## 2016-11-21 DIAGNOSIS — Z85828 Personal history of other malignant neoplasm of skin: Secondary | ICD-10-CM | POA: Diagnosis not present

## 2016-11-21 LAB — POTASSIUM: POTASSIUM: 5.1 mmol/L (ref 3.5–5.1)

## 2016-11-21 LAB — COMPLETE METABOLIC PANEL WITH GFR
ALK PHOS: 76 U/L (ref 40–115)
ALT: 22 U/L (ref 9–46)
AST: 22 U/L (ref 10–35)
Albumin: 3.8 g/dL (ref 3.6–5.1)
BILIRUBIN TOTAL: 0.5 mg/dL (ref 0.2–1.2)
BUN: 47 mg/dL — ABNORMAL HIGH (ref 7–25)
CALCIUM: 9 mg/dL (ref 8.6–10.3)
CO2: 16 mmol/L — ABNORMAL LOW (ref 20–31)
CREATININE: 2.21 mg/dL — AB (ref 0.70–1.11)
Chloride: 113 mmol/L — ABNORMAL HIGH (ref 98–110)
GFR, EST AFRICAN AMERICAN: 30 mL/min — AB (ref >=60)
GFR, Est Non African American: 26 mL/min — ABNORMAL LOW (ref >=60)
Glucose, Bld: 107 mg/dL — ABNORMAL HIGH (ref 65–99)
Potassium: 6.5 mmol/L (ref 3.5–5.3)
Sodium: 138 mmol/L (ref 135–146)
TOTAL PROTEIN: 6.4 g/dL (ref 6.1–8.1)

## 2016-11-21 NOTE — Telephone Encounter (Signed)
Noted  

## 2016-11-21 NOTE — Telephone Encounter (Signed)
Spoke with patient reviewed information and instructions with patient. Patient verbalized understanding of all instructions. Scheduled follow up appt with Dr Anitra Lauth next week.

## 2016-11-21 NOTE — ED Provider Notes (Signed)
[facility] Provider Note: Georgena Spurling, MD, FACEP  CSN: [id number] MRN: [id number] ARRIVAL: 11/21/16 at [place]: [place]  Abnormal Lab   HISTORY OF PRESENT ILLNESS  11/21/16 2:23 AM Maia Petties Labarge is a 83 y.o. male with renal disease not on dialysis who was seen by his PCP yesterday for diarrhea. Routine lab work in the office was done and his potassium was found to be 6.5. He was advised to come to the ED for evaluation. He is denying chest pain, shortness of breath or palpitations. His bowel movements have returned to normal. He denies nausea or vomiting.   Past Medical History:  Diagnosis Date  . Basal cell carcinoma    skin  . Bigeminal rhythm   . Cataract   . Coronary artery disease    post bypass  . CVD (cardiovascular disease)   . Diabetes mellitus without complication ([place])   . Diverticular disease   . Easy bruising   . 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 Gout   . Hernia   . Hyperlipidemia   . Hypertension   . Iron deficiency anemia 02/04/2016  . Macular degeneration   . Microscopic colitis   . PVD (peripheral vascular disease) ([place])     Past Surgical History:  Procedure Laterality Date  . CARDIAC CATHETERIZATION  2006  . CARDIOVERSION N/A 02/22/2013   Procedure: CARDIOVERSION;  Surgeon: [name], MD;  Location: [facility] ENDOSCOPY;  Service: Cardiovascular;  Laterality: N/A;  . CARDIOVERSION N/A 11/15/2014   Procedure: CARDIOVERSION;  Surgeon: [name], MD;  Location: [place];  Service: Cardiovascular;  Laterality: N/A;  . CAROTID ENDARTERECTOMY  2009/ 1993   left/ right  . CORONARY ARTERY BYPASS GRAFT  1999  . EYE SURGERY     eyelid repair  . INGUINAL HERNIA REPAIR  02/11/2012   Procedure: LAPAROSCOPIC BILATERAL INGUINAL HERNIA REPAIR;  Surgeon: Pedro Earls, MD;  Location: [facility];  Service: General;  Laterality: Bilateral;  . SKIN CANCER EXCISION     right ear x 3    Family History    Problem Relation Age of Onset  . Stroke Mother   . Coronary artery disease Father   . Heart disease Father   . Melanoma Sister     Social History  Substance Use Topics  . Smoking status: Former Smoker    Quit date: 12/25/1961  . Smokeless tobacco: Former Systems developer    Types: Chew    Quit date: 02/03/1969  . Alcohol use No    Prior to Admission medications   Medication Sig Start Date End Date Taking? Authorizing Provider  allopurinol (ZYLOPRIM) 300 MG tablet Take 0.5 tablets (150 mg total) by mouth daily. 04/11/16   Kuneff, Renee A, DO  Aloe Vera 25 MG CAPS Take 2 capsules by mouth daily.    [provider]  aspirin 81 MG tablet Take 81 mg by mouth at bedtime.     [provider]  atorvastatin (LIPITOR) 20 MG tablet TAKE 1 TABLET AT BEDTIME 10/31/16   Kuneff, Renee A, DO  carvedilol (COREG) 12.5 MG tablet TAKE 1 TABLET TWICE A DAY 08/12/16   Kuneff, Renee A, DO  cholecalciferol (VITAMIN D) 1000 units tablet Take 5,000 Units by mouth daily.    [provider]  CHROMIUM GTF PO Take 2 capsules by mouth daily.    [provider]  Cinnamon 500 MG TABS Take 2 tablets by mouth at bedtime.     [provider]  cloNIDine (CATAPRES) 0.1 MG tablet Take 1 tablet (0.1 mg total) by mouth 2 (two) times daily as needed. 06/18/16   Kuneff, Renee A, DO  Coenzyme Q10 (CO Q 10) 100 MG CAPS Take 1 capsule by mouth daily.     [provider]  DARBEPOETIN ALFA-ALBUMIN IJ Inject as directed.    [provider]  dicyclomine (BENTYL) 10 MG capsule Take 1 capsule (10 mg total) by mouth 4 (four) times daily -  before meals and at bedtime. 11/20/16   Kuneff, Renee A, DO  ELIQUIS 2.5 MG TABS tablet TAKE 1 TABLET TWICE A DAY 08/12/16   [name], MD  fish oil-omega-3 fatty acids 1000 MG capsule Take 1 g by mouth daily.     [provider]  glucose blood (PRODIGY NO CODING BLOOD GLUC) test strip Use as instructed once daily 07/03/16   Kuneff, Renee A,  DO  lisinopril (PRINIVIL,ZESTRIL) 40 MG tablet Take 1 tablet (40 mg total) by mouth daily. 10/02/16   Kuneff, Renee A, DO  Multiple Vitamins-Minerals (PRESERVISION AREDS PO) Take 1 tablet by mouth 2 (two) times daily.     [provider]  nitroGLYCERIN (NITROSTAT) 0.4 MG SL tablet Place 0.4 mg under the tongue every 5 (five) minutes as needed (up to 3 doses only). For chest pain    [provider]  Probiotic Product (PROBIOTIC PO) Take 1 tablet by mouth daily.    [provider]  PRODIGY SAFETY LANCETS 26G MISC Check fasting glucose once daily 07/03/16   Kuneff, Renee A, DO  tamsulosin (FLOMAX) 0.4 MG CAPS capsule Take 1 capsule (0.4 mg total) by mouth daily after supper. 08/18/16   Kuneff, Renee A, DO  VANADYL SULFATE PO Take 1 capsule by mouth daily.    [provider]  zinc gluconate 50 MG tablet Take 50 mg by mouth daily.     [provider]    Allergies Penicillins   REVIEW OF SYSTEMS  Negative except as noted here or in the History of Present Illness.   PHYSICAL EXAMINATION  Initial Vital Signs Blood pressure (!) 151/50, pulse (!) 55, temperature 97.8 F (36.6 C), temperature source Oral, resp. rate 14, height 5\' 8"  (1.727 m), weight 67.1 kg (148 lb), SpO2 98 %.  Examination General: Well-developed, well-nourished male in no acute distress; appearance consistent with age of record HENT: normocephalic; atraumatic Eyes: pupils equal, round and reactive to light; extraocular muscles intact Neck: supple Heart: regular rate and rhythm Lungs: clear to auscultation bilaterally Abdomen: soft; nondistended; nontender; bowel sounds present Extremities: No deformity; full range of motion; pulses normal Neurologic: Awake, alert and oriented; motor function intact in all extremities and symmetric; no facial droop Skin: Warm and dry Psychiatric: Normal mood and affect   RESULTS  Summary of this visit's results, reviewed by myself:   EKG  Interpretation  Date/Time:  Friday November 21 2016 02:03:51 EDT Ventricular Rate:  55 PR Interval:    QRS Duration: 112 QT Interval:  430 QTC Calculation: 412 R Axis:   81 Text Interpretation:  Sinus rhythm Prolonged PR interval Low voltage, extremity leads Anteroseptal infarct, age indeterminate Sinus Pause seen previously Confirmed by Shanon Rosser [phone]) on 11/21/2016 2:15:54 AM      Laboratory Studies: Results for orders placed or performed during the hospital encounter of 11/21/16 (from the past 24 hour(s))  Potassium     Status: None   Collection Time: 11/21/16  1:54 AM  Result Value Ref Range  Potassium 5.1 3.5 - 5.1 mmol/L   Imaging Studies: No results found.  ED COURSE  Nursing notes and initial vitals signs, including pulse oximetry, reviewed.  Vitals:   11/21/16 0203  BP: (!) 151/50  Pulse: (!) 55  Resp: 14  Temp: 97.8 F (36.6 C)  TempSrc: Oral  SpO2: 98%  Weight: 67.1 kg (148 lb)  Height: 5\' 8"  (1.727 m)   2:28 AM Patient advised of the specimen likely hemolyzed, potassium is 5.1 here which is within normal limits.  PROCEDURES    ED DIAGNOSES     ICD-10-CM   1. Abnormal laboratory test result R89.9        Yanni Ruberg, Jenny Reichmann, MD 11/21/16 [phone]

## 2016-11-21 NOTE — ED Triage Notes (Signed)
Pt was called by his PCP office to come into hospital for potassium reading of 6.5 in his lab work from today.  Pt denies any chest pain, denies shortness of breath, denies palpitations, states he was sleeping comfortably until the phone call.  Pt has renal disease but states he still makes urine regularly.

## 2016-11-21 NOTE — ED Notes (Signed)
Pt verbalizes understanding of d/c instructions and denies any further needs at this time. 

## 2016-11-21 NOTE — Telephone Encounter (Signed)
Please call pt: - Please have him stop using the lisinopril for now. He can use the clonidine every 8 hours to control his blood pressure. - Per the ED note, his diarrhea had stopped? So I am assuming he does not need an anti-diarrheal.  - His labs from yesterday did not show evidence of infection by elevated white count.   He is rather dehydrated and his kidney function is worsening from the dehydration/diarrhea and lisinopril use.  - I would like him to follow-up sometime next week with Dr. Genelle Gather (since I'm out of town) to recheck BMP, blood pressure and follow-up on diarrhea. - if he is not improving or worsening over the weekend, I would want him to go to ED for re-evaluation and IV fluids.

## 2016-11-21 NOTE — Telephone Encounter (Signed)
FYI

## 2016-11-21 NOTE — ED Notes (Signed)
ED Provider at bedside. 

## 2016-11-22 LAB — CLOSTRIDIUM DIFFICILE BY PCR: Toxigenic C. Difficile by PCR: NOT DETECTED

## 2016-11-24 ENCOUNTER — Telehealth: Payer: Self-pay | Admitting: *Deleted

## 2016-11-24 ENCOUNTER — Encounter: Payer: Self-pay | Admitting: Family Medicine

## 2016-11-24 DIAGNOSIS — D1801 Hemangioma of skin and subcutaneous tissue: Secondary | ICD-10-CM | POA: Diagnosis not present

## 2016-11-24 DIAGNOSIS — L812 Freckles: Secondary | ICD-10-CM | POA: Diagnosis not present

## 2016-11-24 DIAGNOSIS — L570 Actinic keratosis: Secondary | ICD-10-CM | POA: Diagnosis not present

## 2016-11-24 DIAGNOSIS — L821 Other seborrheic keratosis: Secondary | ICD-10-CM | POA: Diagnosis not present

## 2016-11-24 DIAGNOSIS — D692 Other nonthrombocytopenic purpura: Secondary | ICD-10-CM | POA: Diagnosis not present

## 2016-11-24 DIAGNOSIS — Z85828 Personal history of other malignant neoplasm of skin: Secondary | ICD-10-CM | POA: Diagnosis not present

## 2016-11-24 NOTE — Telephone Encounter (Signed)
OK to stay off the lisinopril until I see him in office on 8/1. Check bp and heart rate once daily until this appt, write these down to bring in office for review. -thx

## 2016-11-24 NOTE — Telephone Encounter (Signed)
SW pt and he stated that he has only checked his BP once and he thinks it was 145/50. He stated that the diarrhea is improving.

## 2016-11-24 NOTE — Telephone Encounter (Signed)
Per Dr. Anitra Lauth please call pt and find out how his BP's have been. If <140/90 he should continue to hold Lisinopril. If BP's are >140/90 please find out the numbers. Also, has his diarrhea/vominting improved/resolved?

## 2016-11-25 LAB — STOOL CULTURE

## 2016-11-25 NOTE — Telephone Encounter (Signed)
Left detailed message on home vm, okay per DPR.

## 2016-11-26 ENCOUNTER — Encounter: Payer: Self-pay | Admitting: Family Medicine

## 2016-11-26 ENCOUNTER — Other Ambulatory Visit: Payer: Self-pay | Admitting: Family Medicine

## 2016-11-26 ENCOUNTER — Ambulatory Visit (INDEPENDENT_AMBULATORY_CARE_PROVIDER_SITE_OTHER): Payer: Medicare Other | Admitting: Family Medicine

## 2016-11-26 VITALS — BP 170/72 | HR 59 | Temp 97.9°F | Resp 16 | Ht 68.0 in | Wt 152.5 lb

## 2016-11-26 DIAGNOSIS — E860 Dehydration: Secondary | ICD-10-CM

## 2016-11-26 DIAGNOSIS — I2583 Coronary atherosclerosis due to lipid rich plaque: Secondary | ICD-10-CM

## 2016-11-26 DIAGNOSIS — K529 Noninfective gastroenteritis and colitis, unspecified: Secondary | ICD-10-CM | POA: Diagnosis not present

## 2016-11-26 DIAGNOSIS — E875 Hyperkalemia: Secondary | ICD-10-CM | POA: Diagnosis not present

## 2016-11-26 DIAGNOSIS — I2510 Atherosclerotic heart disease of native coronary artery without angina pectoris: Secondary | ICD-10-CM | POA: Diagnosis not present

## 2016-11-26 DIAGNOSIS — R7989 Other specified abnormal findings of blood chemistry: Secondary | ICD-10-CM | POA: Diagnosis not present

## 2016-11-26 LAB — BASIC METABOLIC PANEL
BUN: 36 mg/dL — ABNORMAL HIGH (ref 6–23)
CALCIUM: 8.5 mg/dL (ref 8.4–10.5)
CO2: 21 meq/L (ref 19–32)
CREATININE: 1.78 mg/dL — AB (ref 0.40–1.50)
Chloride: 113 mEq/L — ABNORMAL HIGH (ref 96–112)
GFR: 38.68 mL/min — AB (ref >60.00)
GLUCOSE: 113 mg/dL — AB (ref 70–99)
Potassium: 5.4 mEq/L — ABNORMAL HIGH (ref 3.5–5.1)
Sodium: 138 mEq/L (ref 135–145)

## 2016-11-26 MED ORDER — SODIUM POLYSTYRENE SULFONATE 15 GM/60ML PO SUSP: 15.0000 g | Freq: Once | ORAL | 0 refills | Status: AC

## 2016-11-26 NOTE — Progress Notes (Signed)
OFFICE VISIT  11/26/2016   CC:  Chief Complaint  Patient presents with  . Follow-up    dehydration and diarrhea    HPI:    Patient is a 83 y.o. Caucasian male who presents for f/u recent diarrhea illness and dehydration. Cr rose from 1.6 to 2.2 and K+ was 6.5 on 11/20/16 when he initially presented to our office. He went to the ED on 11/21/16 and repeat K was 5.1.  Reviewd all ED records from that visit today. He was instructed to stop his ACE-I when K+ and Cr results came back, and I had him remain off of it after his repeat K was 5.1.  Feeling fine.  Diarrhea still present.   Levsin has helped settle his stomach.  Having BM twice a day now, compared to 4-5 when he was here last. He has hx of chronic diarrhea.  Home bp's 120s-150s over 50s, HR avg 60.  He has remained off the lisinopril.    Past Medical History:  Diagnosis Date  . Basal cell carcinoma    skin  . Bigeminal rhythm   . Cataract   . Chronic renal insufficiency, stage 3 (moderate) 2018   GFR 30s-40s  . Coronary artery disease    post bypass  . CVD (cardiovascular disease)   . Diabetes mellitus without complication ([place])   . Diverticular disease   . Easy bruising   . 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 Gout   . Hernia   . Hyperlipidemia   . Hypertension   . Iron deficiency anemia 02/04/2016  . Macular degeneration   . Microscopic colitis   . PVD (peripheral vascular disease) ([place])     Past Surgical History:  Procedure Laterality Date  . CARDIAC CATHETERIZATION  2006  . CARDIOVERSION N/A 02/22/2013   Procedure: CARDIOVERSION;  Surgeon: [name], MD;  Location: [facility] ENDOSCOPY;  Service: Cardiovascular;  Laterality: N/A;  . CARDIOVERSION N/A 11/15/2014   Procedure: CARDIOVERSION;  Surgeon: [name], MD;  Location: [place];  Service: Cardiovascular;  Laterality: N/A;  . CAROTID ENDARTERECTOMY  2009/ 1993   left/ right  . CORONARY ARTERY BYPASS GRAFT  1999  . EYE SURGERY     eyelid repair  . INGUINAL HERNIA REPAIR  02/11/2012   Procedure: LAPAROSCOPIC BILATERAL INGUINAL HERNIA REPAIR;  Surgeon: Pedro Earls, MD;  Location: [facility] OR[facility];  Service: General;  Laterality: Bilateral;  . SKIN CANCER EXCISION     right ear x 3    Outpatient Medications Prior to Visit  Medication Sig Dispense Refill  . allopurinol (ZYLOPRIM) 300 MG tablet Take 0.5 tablets (150 mg total) by mouth daily. 45 tablet 3  . Aloe Vera 25 MG CAPS Take 2 capsules by mouth daily.    Marland Kitchen aspirin 81 MG tablet Take 81 mg by mouth at bedtime.     Marland Kitchen atorvastatin (LIPITOR) 20 MG tablet TAKE 1 TABLET AT BEDTIME 90 tablet 0  . carvedilol (COREG) 12.5 MG tablet TAKE 1 TABLET TWICE A DAY 180 tablet 1  . cholecalciferol (VITAMIN D) 1000 units tablet Take 5,000 Units by mouth daily.    . CHROMIUM GTF PO Take 2 capsules by mouth daily.    . Cinnamon 500 MG TABS Take 2 tablets by mouth at bedtime.     . cloNIDine (CATAPRES) 0.1 MG tablet Take 1 tablet (0.1 mg total) by mouth 2 (two) times daily as needed. 180 tablet 1  . Coenzyme Q10 (CO Q 10) 100 MG CAPS Take 1  capsule by mouth daily.     Marland Kitchen DARBEPOETIN ALFA-ALBUMIN IJ Inject as directed.    . dicyclomine (BENTYL) 10 MG capsule Take 1 capsule (10 mg total) by mouth 4 (four) times daily -  before meals and at bedtime. 120 capsule 0  . ELIQUIS 2.5 MG TABS tablet TAKE 1 TABLET TWICE A DAY 180 tablet 1  . fish oil-omega-3 fatty acids 1000 MG capsule Take 1 g by mouth daily.     Marland Kitchen glucose blood (PRODIGY NO CODING BLOOD GLUC) test strip Use as instructed once daily 100 each 12  . lisinopril (PRINIVIL,ZESTRIL) 40 MG tablet Take 1 tablet (40 mg total) by mouth daily. 90 tablet 0  . Multiple Vitamins-Minerals (PRESERVISION AREDS PO) Take 1 tablet by mouth 2 (two) times daily.     . nitroGLYCERIN (NITROSTAT) 0.4 MG SL tablet Place 0.4 mg under the tongue every 5 (five) minutes as needed (up to 3 doses only). For chest pain    . Probiotic Product (PROBIOTIC PO) Take 1  tablet by mouth daily.    Marland Kitchen PRODIGY SAFETY LANCETS 26G MISC Check fasting glucose once daily 100 each 1  . tamsulosin (FLOMAX) 0.4 MG CAPS capsule Take 1 capsule (0.4 mg total) by mouth daily after supper. 90 capsule 1  . VANADYL SULFATE PO Take 1 capsule by mouth daily.    Marland Kitchen zinc gluconate 50 MG tablet Take 50 mg by mouth daily.      No facility-administered medications prior to visit.     Allergies  Allergen Reactions  . Penicillins Hives    ROS As per HPI  PE: BP 170/72 on recheck (initial was 179/63) Blood pressure (!) 170/72, pulse (!) 59, temperature 97.9 F (36.6 C), temperature source Oral, resp. rate 16, height 5\' 8"  (1.727 m), weight 152 lb 8 oz (69.2 kg), SpO2 99 %. Gen: Alert, well appearing.  Patient is oriented to person, place, time, and situation. AFFECT: pleasant, lucid thought and speech. CV: RRR, no m/r/g.   LUNGS: CTA bilat, nonlabored resps, good aeration in all lung fields. ABD: soft, NT, ND, BS normal.  No hepatospenomegaly or mass.  No bruits. EXT: no clubbing, cyanosis, or edema.    LABS:  Lab Results  Component Value Date   WBC 7.8 11/20/2016   HGB 13.0 11/20/2016   HCT 40.0 11/20/2016   MCV 94.3 11/20/2016   PLT 202.0 11/20/2016      Chemistry      Component Value Date/Time   NA 138 11/20/2016 1022   NA 140 10/16/2016 0909   NA 140 03/28/2016 0853   K 5.1 11/21/2016 0154   K 4.3 10/16/2016 0909   K 4.4 03/28/2016 0853   CL 113 (H) 11/20/2016 1022   CL 111 (H) 10/16/2016 0909   CO2 16 (L) 11/20/2016 1022   CO2 22 10/16/2016 0909   CO2 19 (L) 03/28/2016 0853   BUN 47 (H) 11/20/2016 1022   BUN 24 (H) 10/16/2016 0909   BUN 34.4 (H) 03/28/2016 0853   CREATININE 2.21 (H) 11/20/2016 1022   CREATININE 1.7 (H) 03/28/2016 0853   GLU 113 07/24/2016      Component Value Date/Time   CALCIUM 9.0 11/20/2016 1022   CALCIUM 8.7 10/16/2016 0909   CALCIUM 9.2 03/28/2016 0853   ALKPHOS 76 11/20/2016 1022   ALKPHOS 66 10/16/2016 0909   ALKPHOS 85  03/28/2016 0853   AST 22 11/20/2016 1022   AST 36 10/16/2016 0909   AST 23 03/28/2016 0853   ALT  22 11/20/2016 1022   ALT 35 10/16/2016 0909   ALT 37 03/28/2016 0853   BILITOT 0.5 11/20/2016 1022   BILITOT 0.90 10/16/2016 0909   BILITOT 0.79 03/28/2016 0853      IMPRESSION AND PLAN:  1) Chronic diarrhea--improved since last visit. Stool testing neg. Continue levsin.  2) Acute on chronic RF: recheck BMET today. Wt is up, hopefully indicating better hydration status.  3) Hyperkalemia: recheck at ED following lab here was 5.1 (w/out treatment). Has remained off ACE-I.  Recheck lytes/cr today.  4) HTN: BP's moderately up (systolics) at home, a bit worse here in office today. If cr and K back to baseline, will restart lisinopril.  An After Visit Summary was printed and given to the patient.  FOLLOW UP: Return in about 2 weeks (around 12/10/2016) for keep appt with Dr. Raoul Pitch 01/15/17.  Signed:  Crissie Sickles, MD           11/26/2016

## 2016-11-28 ENCOUNTER — Other Ambulatory Visit (INDEPENDENT_AMBULATORY_CARE_PROVIDER_SITE_OTHER): Payer: Medicare Other

## 2016-11-28 DIAGNOSIS — E875 Hyperkalemia: Secondary | ICD-10-CM | POA: Diagnosis not present

## 2016-11-28 LAB — BASIC METABOLIC PANEL
BUN: 30 mg/dL — ABNORMAL HIGH (ref 6–23)
CO2: 21 mEq/L (ref 19–32)
Calcium: 8.3 mg/dL — ABNORMAL LOW (ref 8.4–10.5)
Chloride: 114 mEq/L — ABNORMAL HIGH (ref 96–112)
Creatinine, Ser: 1.71 mg/dL — ABNORMAL HIGH (ref 0.40–1.50)
GFR: 40.51 mL/min — ABNORMAL LOW (ref >60.00)
Glucose, Bld: 119 mg/dL — ABNORMAL HIGH (ref 70–99)
Potassium: 4 mEq/L (ref 3.5–5.1)
Sodium: 142 mEq/L (ref 135–145)

## 2016-12-03 ENCOUNTER — Encounter: Payer: Self-pay | Admitting: Family Medicine

## 2016-12-03 ENCOUNTER — Ambulatory Visit (INDEPENDENT_AMBULATORY_CARE_PROVIDER_SITE_OTHER): Payer: Medicare Other | Admitting: Family Medicine

## 2016-12-03 VITALS — BP 148/60 | HR 58 | Temp 97.6°F | Resp 20 | Wt 154.5 lb

## 2016-12-03 DIAGNOSIS — D508 Other iron deficiency anemias: Secondary | ICD-10-CM

## 2016-12-03 DIAGNOSIS — I10 Essential (primary) hypertension: Secondary | ICD-10-CM | POA: Diagnosis not present

## 2016-12-03 DIAGNOSIS — I499 Cardiac arrhythmia, unspecified: Secondary | ICD-10-CM

## 2016-12-03 DIAGNOSIS — N189 Chronic kidney disease, unspecified: Secondary | ICD-10-CM

## 2016-12-03 DIAGNOSIS — E876 Hypokalemia: Secondary | ICD-10-CM

## 2016-12-03 DIAGNOSIS — I480 Paroxysmal atrial fibrillation: Secondary | ICD-10-CM | POA: Diagnosis not present

## 2016-12-03 DIAGNOSIS — I498 Other specified cardiac arrhythmias: Secondary | ICD-10-CM

## 2016-12-03 DIAGNOSIS — R0609 Other forms of dyspnea: Secondary | ICD-10-CM

## 2016-12-04 ENCOUNTER — Other Ambulatory Visit (INDEPENDENT_AMBULATORY_CARE_PROVIDER_SITE_OTHER): Payer: Medicare Other

## 2016-12-04 DIAGNOSIS — I499 Cardiac arrhythmia, unspecified: Secondary | ICD-10-CM | POA: Diagnosis not present

## 2016-12-04 DIAGNOSIS — R0609 Other forms of dyspnea: Secondary | ICD-10-CM | POA: Diagnosis not present

## 2016-12-04 DIAGNOSIS — N189 Chronic kidney disease, unspecified: Secondary | ICD-10-CM | POA: Diagnosis not present

## 2016-12-04 DIAGNOSIS — E876 Hypokalemia: Secondary | ICD-10-CM | POA: Diagnosis not present

## 2016-12-04 DIAGNOSIS — I498 Other specified cardiac arrhythmias: Secondary | ICD-10-CM

## 2016-12-04 DIAGNOSIS — D508 Other iron deficiency anemias: Secondary | ICD-10-CM | POA: Diagnosis not present

## 2016-12-04 LAB — CBC WITH DIFFERENTIAL/PLATELET
BASOS ABS: 0 10*3/uL (ref 0.0–0.1)
BASOS PCT: 0.4 % (ref 0.0–3.0)
EOS PCT: 1.7 % (ref 0.0–5.0)
Eosinophils Absolute: 0.1 10*3/uL (ref 0.0–0.7)
HCT: 33.8 % — ABNORMAL LOW (ref 39.0–52.0)
Hemoglobin: 11 g/dL — ABNORMAL LOW (ref 13.0–17.0)
LYMPHS ABS: 1.3 10*3/uL (ref 0.7–4.0)
Lymphocytes Relative: 20.1 % (ref 12.0–46.0)
MCHC: 32.5 g/dL (ref 30.0–36.0)
MCV: 92.6 fl (ref 78.0–100.0)
MONOS PCT: 7.3 % (ref 3.0–12.0)
Monocytes Absolute: 0.5 10*3/uL (ref 0.1–1.0)
NEUTROS ABS: 4.7 10*3/uL (ref 1.4–7.7)
NEUTROS PCT: 70.5 % (ref 43.0–77.0)
PLATELETS: 180 10*3/uL (ref 150.0–400.0)
RBC: 3.65 Mil/uL — ABNORMAL LOW (ref 4.22–5.81)
RDW: 16.5 % — AB (ref 11.5–15.5)
WBC: 6.7 10*3/uL (ref 4.0–10.5)

## 2016-12-04 LAB — MAGNESIUM: Magnesium: 1.5 mg/dL (ref 1.5–2.5)

## 2016-12-04 LAB — TSH: TSH: 3.54 u[IU]/mL (ref 0.35–4.50)

## 2016-12-04 LAB — BRAIN NATRIURETIC PEPTIDE: PRO B NATRI PEPTIDE: 1025 pg/mL — AB (ref 0.0–100.0)

## 2016-12-05 ENCOUNTER — Telehealth: Payer: Self-pay | Admitting: Family Medicine

## 2016-12-05 ENCOUNTER — Ambulatory Visit (HOSPITAL_BASED_OUTPATIENT_CLINIC_OR_DEPARTMENT_OTHER)
Admission: RE | Admit: 2016-12-05 | Discharge: 2016-12-05 | Disposition: A | Discharge status: Home / Self Care | Payer: Medicare Other | Source: Ambulatory Visit | Attending: Family Medicine | Admitting: Family Medicine

## 2016-12-05 DIAGNOSIS — Z951 Presence of aortocoronary bypass graft: Secondary | ICD-10-CM | POA: Diagnosis not present

## 2016-12-05 DIAGNOSIS — R0609 Other forms of dyspnea: Secondary | ICD-10-CM | POA: Diagnosis not present

## 2016-12-05 DIAGNOSIS — R609 Edema, unspecified: Secondary | ICD-10-CM | POA: Diagnosis not present

## 2016-12-05 DIAGNOSIS — R0600 Dyspnea, unspecified: Secondary | ICD-10-CM | POA: Diagnosis not present

## 2016-12-05 LAB — IRON AND TIBC
%SAT: 27 % (ref 15–60)
IRON: 62 ug/dL (ref 50–180)
TIBC: 229 ug/dL — AB (ref 250–425)
UIBC: 167 ug/dL

## 2016-12-05 NOTE — Telephone Encounter (Signed)
Spoke with patient reviewed results and instructions . Patient verbalized understanding . Patient will go get chest xray today as instructed.

## 2016-12-05 NOTE — Telephone Encounter (Signed)
Please call pt: - his labs are stable, with the exception of signs of fluid overload. This could be a side effect to the routine use of the clonidine, which can cause some edema or the fact we have been so worried of dehydration with diarrhea we over shot the fluid intake need. Please have him stop the clonidine and restart the lisinopril today. - He can use the bentyl, but sparingly try to wean off if able. He is currently taking BID PRN, try once a day and if able then stop. He can continue as long as not getting dizziness, confusion  Etc on this medication.  - I would like him to have an outpt CXR this morning at [facility], as early as possible, so I can get the reading back by today. Ordered placed.  - I would also like him to call his cardiologist to be seen for his shortness of breath with exertion.  - I want to follow with before Wednesday next week,  to see how he is doing. If worsening fluid, unintentional weight gain (fluid), or feeling more short of breath he is to be seen in the ED over the weekend.

## 2016-12-05 NOTE — Telephone Encounter (Signed)
Spoke with patient reviewed information and scheduled patient appt.

## 2016-12-05 NOTE — Progress Notes (Signed)
[name] , 1929-03-03, 83 y.o., male MRN: [id number] Patient Care Team    Relationship Specialty Notifications Start End  [name], DO PCP - General Family Medicine  12/18/15   [name], MD Consulting Physician Ophthalmology  12/18/15   [name], MD Consulting Physician Dermatology  12/18/15   [name], MD Consulting Physician Cardiology  12/18/15   [name], MD Consulting Physician Ophthalmology  12/19/15   [name], MD Consulting Physician Oncology  09/30/16   [name], MD Attending Physician Nephrology  09/30/16   [name], MD Consulting Physician Dermatology  09/30/16     Chief Complaint  Patient presents with  . Fatigue    tingling sensation in extremities     Subjective: Pt presents for an OV with complaints of Tingling sensation in his extremities that occurred once. He states he felt tingling sensation from his feet to his head, this lasted a few hours and went away on its own. He has also noticed some dyspnea on exertion over the last few weeks. He has had bouts of diarrhea with normal workup over this time. Secondary to dehydration his lisinopril was stopped and he has been using clonidine twice a day to control blood pressure. He is also been taking Bentyl twice a day for the diarrhea, which has improved greatly. He has been very vigilant on keeping up with his hydration. He states his blood pressures at home are anywhere from 6:57-903 systolic on the clonidine twice a day. He states he hasn't filled a little run down, but was able to play in a corn hole tournament in which to walk back and forth between boards for 4 games. He states he did rather well, but did feel a little weak during the last round. He states when he has his shortness of breath he sits down, and it resolves within a few minutes. Patient has a significant past medical history for iron deficiency anemia, bigeminal rhythm, chronic kidney disease stage III, CAD, CVD  with coronary artery bypass graft, carotid endarterectomy, need for cardioversions and cardiac catheterization. Last echo 06/24/2014 with normal ejection fraction and concentric hypertrophy. Mild MR.   10/23/2014 echocardiogram: Study Conclusions - Left ventricle: The cavity size was normal. There was mild   concentric hypertrophy. Systolic function was normal. The   estimated ejection fraction was in the range of 55% to 60%. Wall   motion was normal; there were no regional wall motion   abnormalities. - Ventricular septum: Septal motion showed paradox. - Mitral valve: There was mild regurgitation. - Left atrium: The atrium was mildly dilated.  Depression screen [facility] 2/9 09/30/2016 12/18/2015  Decreased Interest 0 0  Down, Depressed, Hopeless 0 0  PHQ - 2 Score 0 0    Allergies  Allergen Reactions  . Penicillins Hives   Social History  Substance Use Topics  . Smoking status: Former Smoker    Quit date: 12/25/1961  . Smokeless tobacco: Former Systems developer    Types: Chew    Quit date: 02/03/1969  . Alcohol use No   Past Medical History:  Diagnosis Date  . Basal cell carcinoma    skin  . Bigeminal rhythm   . Cataract   . Chronic renal insufficiency, stage 3 (moderate) 2018   GFR 30s-40s  . Coronary artery disease    post bypass  . CVD (cardiovascular disease)   . Diabetes mellitus without complication ([place])   . Diverticular disease   . Easy bruising   .  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 Gout   . Hernia   . Hyperlipidemia   . Hypertension   . Iron deficiency anemia 02/04/2016  . Macular degeneration   . Microscopic colitis   . PVD (peripheral vascular disease) ([place])    Past Surgical History:  Procedure Laterality Date  . CARDIAC CATHETERIZATION  2006  . CARDIOVERSION N/A 02/22/2013   Procedure: CARDIOVERSION;  Surgeon: [name], MD;  Location: [facility] ENDOSCOPY;  Service: Cardiovascular;  Laterality: N/A;  . CARDIOVERSION N/A 11/15/2014   Procedure:  CARDIOVERSION;  Surgeon: [name], MD;  Location: [place];  Service: Cardiovascular;  Laterality: N/A;  . CAROTID ENDARTERECTOMY  2009/ 1993   left/ right  . CORONARY ARTERY BYPASS GRAFT  1999  . EYE SURGERY     eyelid repair  . INGUINAL HERNIA REPAIR  02/11/2012   Procedure: LAPAROSCOPIC BILATERAL INGUINAL HERNIA REPAIR;  Surgeon: Pedro Earls, MD;  Location: [facility] ORS;  Service: General;  Laterality: Bilateral;  . SKIN CANCER EXCISION     right ear x 3   Family History  Problem Relation Age of Onset  . Stroke Mother   . Coronary artery disease Father   . Heart disease Father   . Melanoma Sister    Allergies as of 12/03/2016      Reactions   Penicillins Hives      Medication List       Accurate as of 12/03/16 11:59 PM. Always use your most recent med list.          allopurinol 300 MG tablet Commonly known as:  ZYLOPRIM Take 0.5 tablets (150 mg total) by mouth daily.   Aloe Vera 25 MG Caps Take 2 capsules by mouth daily.   aspirin 81 MG tablet Take 81 mg by mouth at bedtime.   atorvastatin 20 MG tablet Commonly known as:  LIPITOR TAKE 1 TABLET AT BEDTIME   carvedilol 12.5 MG tablet Commonly known as:  COREG TAKE 1 TABLET TWICE A DAY   cholecalciferol 1000 units tablet Commonly known as:  VITAMIN D Take 5,000 Units by mouth daily.   CHROMIUM GTF PO Take 2 capsules by mouth daily.   Cinnamon 500 MG Tabs Take 2 tablets by mouth at bedtime.   cloNIDine 0.1 MG tablet Commonly known as:  CATAPRES Take 1 tablet (0.1 mg total) by mouth 2 (two) times daily as needed.   Co Q 10 100 MG Caps Take 1 capsule by mouth daily.   DARBEPOETIN ALFA-ALBUMIN IJ Inject as directed.   dicyclomine 10 MG capsule Commonly known as:  BENTYL Take 1 capsule (10 mg total) by mouth 4 (four) times daily -  before meals and at bedtime.   ELIQUIS 2.5 MG Tabs tablet Generic drug:  apixaban TAKE 1 TABLET TWICE A DAY   fish oil-omega-3 fatty acids 1000 MG capsule Take  1 g by mouth daily.   glucose blood test strip Commonly known as:  PRODIGY NO CODING BLOOD GLUC Use as instructed once daily   lisinopril 40 MG tablet Commonly known as:  PRINIVIL,ZESTRIL Take 1 tablet (40 mg total) by mouth daily.   nitroGLYCERIN 0.4 MG SL tablet Commonly known as:  NITROSTAT Place 0.4 mg under the tongue every 5 (five) minutes as needed (up to 3 doses only). For chest pain   PRESERVISION AREDS PO Take 1 tablet by mouth 2 (two) times daily.   PROBIOTIC PO Take 1 tablet by mouth daily.   PRODIGY SAFETY LANCETS 26G Misc Check  fasting glucose once daily   tamsulosin 0.4 MG Caps capsule Commonly known as:  FLOMAX Take 1 capsule (0.4 mg total) by mouth daily after supper.   VANADYL SULFATE PO Take 1 capsule by mouth daily.   zinc gluconate 50 MG tablet Take 50 mg by mouth daily.       All past medical history, surgical history, allergies, family history, immunizations andmedications were updated in the EMR today and reviewed under the history and medication portions of their EMR.     ROS: Negative, with the exception of above mentioned in HPI   Objective:  BP (!) 148/60 (BP Location: Right Arm, Patient Position: Sitting, Cuff Size: Normal)   Pulse (!) 58   Temp 97.6 F (36.4 C)   Resp 20   Wt 154 lb 8 oz (70.1 kg)   SpO2 97%   BMI 23.49 kg/m  Body mass index is 23.49 kg/m. Gen: Afebrile. No acute distress. Nontoxic in appearance, well developed, well nourished. Appears tired today. HENT: AT. [place].  MMM, no oral lesions. Eyes appear puffy.  Eyes:Pupils Equal Round Reactive to light, Extraocular movements intact,  Conjunctiva without redness, discharge or icterus. CV: RRR no murmur appreciated, trace to +1 edema bilaterally Chest: CTAB, no wheeze or crackles. Good air movement, normal resp effort.  Abd: Soft. NTND. BS present Neuro: Normal gait. PERLA. EOMi. Alert. Oriented x3   No exam data present Dg Chest 2 View  Result Date:  12/05/2016 CLINICAL DATA:  Pt with dyspnea upon exertion x 6 mths worsening in last few weeks, notices dyspnea when he is working or moving around, states if he sits for a while he is ok Cardiac bypass surgery in 1991, former smoker EXAM: CHEST - 2 VIEW COMPARISON:  10/19/2014 FINDINGS: Lungs are clear. Heart size and mediastinal contours are within normal limits. Atheromatous aorta. No effusion. Previous median sternotomy and CABG. Surgical clip but the thoracic and arise. Anterior vertebral endplate spurring at multiple levels in the mid thoracic spine. IMPRESSION: No acute cardiopulmonary disease post CABG. Electronically Signed   By: Lucrezia Europe M.D.   On: 12/05/2016 14:31   No results found for this or any previous visit (from the past 24 hour(s)).  Assessment/Plan: SESAR MADEWELL is a 83 y.o. male present for OV for  dyspnea on exertion Chronic kidney disease, unspecified CKD stage Hypokalemia Hypertension/bigeminy/atrial fibrillation Iron deficiency anemia - Uncertain cause of the tingling feeling he explained today, possibly side effect to bentyl use. Advised him to use this medication with caution, it is not ideal in the geriatric population. He is to taper this down to once a day if able, and then stop. If he returns with diarrhea, then will need to send him back to GI to see if they can offer something for him. He has a history of microscopic colitis many medications for treatment not ideal with age and other medication history. - Recheck BMP today, if back to baseline would restart lisinopril, and discontinue the clonidine except for when necessary use. Edema could be secondary to clonidine scheduled use, or over vigilance on keeping hydrated. - He looks puffy, no fluid overloaded on exam today, BNP collected. - Comp Met (CMET) - B Nat Peptide; Future - TSH; Future - Magnesium; Future - CBC w/Diff; Future - Iron Binding Cap (TIBC); Future - Chest x-ray - Will want to monitor closely  after labs resulted. I would also like him evaluated by cardiology for his dyspnea on exertion. Patient is agreeable to this  and will call to make an appointment. If needed may have to treat with a small dose of Lasix, although patient's weight to reflect much fluid gain.   Reviewed expectations re: course of current medical issues.  Discussed self-management of symptoms.  Outlined signs and symptoms indicating need for more acute intervention.  Patient verbalized understanding and all questions were answered.  Patient received an After-Visit Summary.    Orders Placed This Encounter  Procedures  . Comp Met (CMET)  . B Nat Peptide  . TSH  . Magnesium  . CBC w/Diff  . Iron Binding Cap (TIBC)     Note is dictated utilizing voice recognition software. Although note has been proof read prior to signing, occasional typographical errors still can be missed. If any questions arise, please do not hesitate to call for verification.   electronically signed by:  Howard Pouch, DO  [place]

## 2016-12-05 NOTE — Patient Instructions (Signed)
EMR not functionable at the time of appointment.

## 2016-12-05 NOTE — Telephone Encounter (Signed)
Left message for patient to return call on home voice mail. Cell voice mail has not been set up.

## 2016-12-05 NOTE — Telephone Encounter (Signed)
Please call [name], his chest x-ray looks good, no signs of fluid overload or infection within the lungs, which was what we were concerned for keeping his complaints. - Follow-up next week as discussed here, and left able to get him to cardiologist sooner. Still do want him to follow-up with his cardiologist.

## 2016-12-09 DIAGNOSIS — L03032 Cellulitis of left toe: Secondary | ICD-10-CM | POA: Diagnosis not present

## 2016-12-09 DIAGNOSIS — L600 Ingrowing nail: Secondary | ICD-10-CM | POA: Diagnosis not present

## 2016-12-09 LAB — HM DIABETES FOOT EXAM

## 2016-12-10 ENCOUNTER — Ambulatory Visit (INDEPENDENT_AMBULATORY_CARE_PROVIDER_SITE_OTHER): Payer: Medicare Other | Admitting: Family Medicine

## 2016-12-10 ENCOUNTER — Encounter: Payer: Self-pay | Admitting: Family Medicine

## 2016-12-10 VITALS — BP 138/88 | HR 53 | Temp 97.5°F | Resp 20 | Ht 68.0 in | Wt 151.0 lb

## 2016-12-10 DIAGNOSIS — I10 Essential (primary) hypertension: Secondary | ICD-10-CM

## 2016-12-10 DIAGNOSIS — I480 Paroxysmal atrial fibrillation: Secondary | ICD-10-CM

## 2016-12-10 DIAGNOSIS — N183 Chronic kidney disease, stage 3 unspecified: Secondary | ICD-10-CM

## 2016-12-10 NOTE — Progress Notes (Signed)
[name] , Oct 20, 1928, 83 y.o., male MRN: [id number] Patient Care Team    Relationship Specialty Notifications Start End  [name], DO PCP - General Family Medicine  12/18/15   [name], MD Consulting Physician Ophthalmology  12/18/15   [name], MD Consulting Physician Dermatology  12/18/15   [name], MD Consulting Physician Cardiology  12/18/15   [name], MD Consulting Physician Ophthalmology  12/19/15   [name], MD Consulting Physician Oncology  09/30/16   [name], MD Attending Physician Nephrology  09/30/16   [name], MD Consulting Physician Dermatology  09/30/16     Chief Complaint  Patient presents with  . Follow-up    abnormal labs  . Hypertension     Subjective:  Patient presents for follow-up today from last week when he appeared fluid overloaded, with complaints of dyspnea on exertion. He reports he is feeling better today. His clonidine was stopped and he was started back on his lisinopril, secondary to kidney function returning to baseline. Weight has gone down 3.5 pounds since stopping the clonidine, which may have caused some edema. His reported blood pressures at home are 761-950 systolic routinely. He denies any additional numbness/tingling episodes or dyspnea on exertion. He reports compliance with lisinopril 40 mg daily, Coreg 12.5 twice a day. His chest x-ray was normal. ProBNP 1025, iron 62, TIBC 229, percent saturation 27th, hemoglobin 11, hematocrit 33.8, TSH 3.54. Magnesium 1.5.   Prior note: Pt presents for an OV with complaints of Tingling sensation in his extremities that occurred once. He states he felt tingling sensation from his feet to his head, this lasted a few hours and went away on its own. He has also noticed some dyspnea on exertion over the last few weeks. He has had bouts of diarrhea with normal workup over this time. Secondary to dehydration his lisinopril was stopped and he has been using  clonidine twice a day to control blood pressure. He is also been taking Bentyl twice a day for the diarrhea, which has improved greatly. He has been very vigilant on keeping up with his hydration. He states his blood pressures at home are anywhere from 9:32-671 systolic on the clonidine twice a day. He states he hasn't filled a little run down, but was able to play in a corn hole tournament in which to walk back and forth between boards for 4 games. He states he did rather well, but did feel a little weak during the last round. He states when he has his shortness of breath he sits down, and it resolves within a few minutes. Patient has a significant past medical history for iron deficiency anemia, bigeminal rhythm, chronic kidney disease stage III, CAD, CVD with coronary artery bypass graft, carotid endarterectomy, need for cardioversions and cardiac catheterization. Last echo 06/24/2014 with normal ejection fraction and concentric hypertrophy. Mild MR.   10/23/2014 echocardiogram: Study Conclusions - Left ventricle: The cavity size was normal. There was mild   concentric hypertrophy. Systolic function was normal. The   estimated ejection fraction was in the range of 55% to 60%. Wall   motion was normal; there were no regional wall motion   abnormalities. - Ventricular septum: Septal motion showed paradox. - Mitral valve: There was mild regurgitation. - Left atrium: The atrium was mildly dilated.  Depression screen [facility] 2/9 09/30/2016 12/18/2015  Decreased Interest 0 0  Down, Depressed, Hopeless 0 0  PHQ - 2 Score 0 0  Allergies  Allergen Reactions  . Penicillins Hives   Social History  Substance Use Topics  . Smoking status: Former Smoker    Quit date: 12/25/1961  . Smokeless tobacco: Former Systems developer    Types: Chew    Quit date: 02/03/1969  . Alcohol use No   Past Medical History:  Diagnosis Date  . Basal cell carcinoma    skin  . Bigeminal rhythm   . Cataract   . Chronic renal  insufficiency, stage 3 (moderate) 2018   GFR 30s-40s  . Coronary artery disease    post bypass  . CVD (cardiovascular disease)   . Diabetes mellitus without complication ([place])   . Diverticular disease   . Easy bruising   . 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 Gout   . Hernia   . Hyperlipidemia   . Hypertension   . Iron deficiency anemia 02/04/2016  . Macular degeneration   . Microscopic colitis   . PVD (peripheral vascular disease) ([place])    Past Surgical History:  Procedure Laterality Date  . CARDIAC CATHETERIZATION  2006  . CARDIOVERSION N/A 02/22/2013   Procedure: CARDIOVERSION;  Surgeon: [name], MD;  Location: [facility] ENDOSCOPY;  Service: Cardiovascular;  Laterality: N/A;  . CARDIOVERSION N/A 11/15/2014   Procedure: CARDIOVERSION;  Surgeon: [name], MD;  Location: [place];  Service: Cardiovascular;  Laterality: N/A;  . CAROTID ENDARTERECTOMY  2009/ 1993   left/ right  . CORONARY ARTERY BYPASS GRAFT  1999  . EYE SURGERY     eyelid repair  . INGUINAL HERNIA REPAIR  02/11/2012   Procedure: LAPAROSCOPIC BILATERAL INGUINAL HERNIA REPAIR;  Surgeon: Pedro Earls, MD;  Location: [facility] ORS;  Service: General;  Laterality: Bilateral;  . SKIN CANCER EXCISION     right ear x 3   Family History  Problem Relation Age of Onset  . Stroke Mother   . Coronary artery disease Father   . Heart disease Father   . Melanoma Sister    Allergies as of 12/10/2016      Reactions   Penicillins Hives      Medication List       Accurate as of 12/10/16 10:59 AM. Always use your most recent med list.          allopurinol 300 MG tablet Commonly known as:  ZYLOPRIM Take 0.5 tablets (150 mg total) by mouth daily.   Aloe Vera 25 MG Caps Take 2 capsules by mouth daily.   aspirin 81 MG tablet Take 81 mg by mouth at bedtime.   atorvastatin 20 MG tablet Commonly known as:  LIPITOR TAKE 1 TABLET AT BEDTIME   azithromycin 250 MG tablet Commonly known as:   ZITHROMAX   carvedilol 12.5 MG tablet Commonly known as:  COREG TAKE 1 TABLET TWICE A DAY   cholecalciferol 1000 units tablet Commonly known as:  VITAMIN D Take 5,000 Units by mouth daily.   CHROMIUM GTF PO Take 2 capsules by mouth daily.   Cinnamon 500 MG Tabs Take 2 tablets by mouth at bedtime.   cloNIDine 0.1 MG tablet Commonly known as:  CATAPRES Take 1 tablet (0.1 mg total) by mouth 2 (two) times daily as needed.   Co Q 10 100 MG Caps Take 1 capsule by mouth daily.   DARBEPOETIN ALFA-ALBUMIN IJ Inject as directed.   dicyclomine 10 MG capsule Commonly known as:  BENTYL Take 1 capsule (10 mg total) by mouth 4 (four) times daily -  before meals and at bedtime.  ELIQUIS 2.5 MG Tabs tablet Generic drug:  apixaban TAKE 1 TABLET TWICE A DAY   fish oil-omega-3 fatty acids 1000 MG capsule Take 1 g by mouth daily.   glucose blood test strip Commonly known as:  PRODIGY NO CODING BLOOD GLUC Use as instructed once daily   lisinopril 40 MG tablet Commonly known as:  PRINIVIL,ZESTRIL Take 1 tablet (40 mg total) by mouth daily.   nitroGLYCERIN 0.4 MG SL tablet Commonly known as:  NITROSTAT Place 0.4 mg under the tongue every 5 (five) minutes as needed (up to 3 doses only). For chest pain   PRESERVISION AREDS PO Take 1 tablet by mouth 2 (two) times daily.   PROBIOTIC PO Take 1 tablet by mouth daily.   PRODIGY SAFETY LANCETS 26G Misc Check fasting glucose once daily   tamsulosin 0.4 MG Caps capsule Commonly known as:  FLOMAX Take 1 capsule (0.4 mg total) by mouth daily after supper.   VANADYL SULFATE PO Take 1 capsule by mouth daily.   zinc gluconate 50 MG tablet Take 50 mg by mouth daily.       All past medical history, surgical history, allergies, family history, immunizations andmedications were updated in the EMR today and reviewed under the history and medication portions of their EMR.     ROS: Negative, with the exception of above mentioned in  HPI   Objective:  BP 138/88 (BP Location: Left Arm, Patient Position: Sitting, Cuff Size: Normal)   Pulse (!) 53   Temp (!) 97.5 F (36.4 C)   Resp 20   Ht 5\' 8"  (1.727 m)   Wt 151 lb (68.5 kg)   SpO2 99%   BMI 22.96 kg/m  Body mass index is 22.96 kg/m.  Gen: Afebrile. No acute distress.  HENT: AT. [place].  MMM.  Eyes:Pupils Equal Round Reactive to light, Extraocular movements intact,  Conjunctiva without redness, discharge or icterus. CV: RRR no murmur, no edema, +2/4 P posterior tibialis pulses Chest: CTAB, no wheeze or crackles Abd: Soft. NTND. BS present.  Neuro:  Normal gait. PERLA. EOMi. Alert. Oriented x3 No exam data present No results found. No results found for this or any previous visit (from the past 24 hour(s)).  Assessment/Plan: RAYDEN DOCK is a 83 y.o. male present for OV for  dyspnea on exertion Chronic kidney disease, unspecified CKD stage Hypokalemia Hypertension/bigeminy/atrial fibrillation Iron deficiency anemia - He looks better today. He doesn't appear fluid overloaded. He no longer complains of dyspnea on exertion. His checks x-ray is normal. Discussed with him the possibility of fluid overload secondary to attempting to hydrate secondary to diarrhea versus side effect from routine clonidine use. Either way his symptoms have improved, however his blood pressure reports from home are still a little higher than desired.  - Consider Trial of amlodipine 2.5 mg QD. He had been on this many years ago by review of prior records.  - Advised him I still would like him to follow-up with his cardiologist if they again seen her. It is reassuring that his dyspnea on exertion has resolved, however his cardiologist may want to proceed with further workup given his complaints over the last few weeks.  Loose stools: - Discussed his prior history microscopic colitis, which was likely treated with prednisone. If unable to control diarrhea, would need to see his GI doctor  for treatment. He can continue the bentyl1 daily as long as he is not experiencing any side effects from this medication which were reviewed with him today  and he denied.    Reviewed expectations re: course of current medical issues.  Discussed self-management of symptoms.  Outlined signs and symptoms indicating need for more acute intervention.  Patient verbalized understanding and all questions were answered.  Patient received an After-Visit Summary.    No orders of the defined types were placed in this encounter.    Note is dictated utilizing voice recognition software. Although note has been proof read prior to signing, occasional typographical errors still can be missed. If any questions arise, please do not hesitate to call for verification.   electronically signed by:  William Pouch, DO  [place]

## 2016-12-10 NOTE — Patient Instructions (Addendum)
I will reach out to Dr. Davonna Belling about your BP and see about his thoughts on adding a medicine.  I still recommending trying to get into seeing sooner, if the shortness of breath returns or you notice more occassions of fluid retention.  Your repeat BP here was decent at 138/88.  I am glad you are no longer having shortness of breath.  You look better today and your weight is down about 3.5 lbs. Of fluid. Which was the likely cause of your shortness of breath.

## 2016-12-11 ENCOUNTER — Telehealth: Payer: Self-pay | Admitting: Family Medicine

## 2016-12-11 DIAGNOSIS — Z23 Encounter for immunization: Secondary | ICD-10-CM | POA: Diagnosis not present

## 2016-12-11 MED ORDER — AMLODIPINE BESYLATE 2.5 MG PO TABS: 2.5000 mg | ORAL_TABLET | Freq: Every day | ORAL | 0 refills | Status: DC

## 2016-12-11 NOTE — Telephone Encounter (Signed)
Spoke with patient reviewed information patient states he doesn't think he had any side effects to amlodipine in the past. He is willing to start low dose.

## 2016-12-11 NOTE — Telephone Encounter (Signed)
Amlodipine called to pharmacy.

## 2016-12-11 NOTE — Telephone Encounter (Signed)
Please call pt: - I would like to start him on a low dose amlodipine (2.5 mg Qd). He was on a this medication about 4 years ago, but at 10 mg a day. This low dose hopefully will be enough to bring BP in normal range more consistently.  - Please ask him if he recalls a reason this was discontinued, if no known SE will call in low dose trail and see him back at his already schedule appt in September to follow up, as long as home pressures are within normal range.

## 2016-12-15 ENCOUNTER — Other Ambulatory Visit: Payer: Self-pay | Admitting: Family Medicine

## 2016-12-30 ENCOUNTER — Other Ambulatory Visit: Payer: Self-pay

## 2016-12-30 MED ORDER — LISINOPRIL 40 MG PO TABS: 40.0000 mg | ORAL_TABLET | Freq: Every day | ORAL | 1 refills | Status: DC

## 2016-12-30 NOTE — Telephone Encounter (Signed)
Refill sent to pharmacy.   

## 2016-12-31 ENCOUNTER — Encounter: Payer: Self-pay | Admitting: Family Medicine

## 2016-12-31 ENCOUNTER — Ambulatory Visit (INDEPENDENT_AMBULATORY_CARE_PROVIDER_SITE_OTHER): Payer: Medicare Other | Admitting: Family Medicine

## 2016-12-31 VITALS — BP 165/53 | HR 63 | Temp 97.7°F | Resp 16 | Ht 68.0 in | Wt 150.5 lb

## 2016-12-31 DIAGNOSIS — T148XXA Other injury of unspecified body region, initial encounter: Secondary | ICD-10-CM

## 2016-12-31 DIAGNOSIS — Z7901 Long term (current) use of anticoagulants: Secondary | ICD-10-CM | POA: Diagnosis not present

## 2016-12-31 DIAGNOSIS — I2510 Atherosclerotic heart disease of native coronary artery without angina pectoris: Secondary | ICD-10-CM | POA: Diagnosis not present

## 2016-12-31 DIAGNOSIS — I2583 Coronary atherosclerosis due to lipid rich plaque: Secondary | ICD-10-CM | POA: Diagnosis not present

## 2016-12-31 NOTE — Progress Notes (Signed)
OFFICE VISIT  12/31/2016   CC:  Chief Complaint  Patient presents with  . Blood Blister   HPI:    Patient is a 83 y.o. Caucasian male who presents for "blood blister on left hand".  Pt chronically on ASA and eliquis.  He has had a blood blister on left hand about 5-6 days now, started after playing golf, he notes he grips the club in a way that likely caused excessive pressure on the area---then the blister came up the next day. No hematuria, no melena or hematochezia, no nosebleeds, no hemoptysis.   He has applied ice to the area. The area does not hurt.  He is in favor of me "popping" it today.  Past Medical History:  Diagnosis Date  . Basal cell carcinoma    skin  . Bigeminal rhythm   . Cataract   . Chronic renal insufficiency, stage 3 (moderate) 2018   GFR 30s-40s  . Coronary artery disease    post bypass  . CVD (cardiovascular disease)   . Diabetes mellitus without complication ([place])   . Diverticular disease   . Easy bruising   . 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 Gout   . Hernia   . Hyperlipidemia   . Hypertension   . Iron deficiency anemia 02/04/2016  . Macular degeneration   . Microscopic colitis   . PVD (peripheral vascular disease) ([place])     Past Surgical History:  Procedure Laterality Date  . CARDIAC CATHETERIZATION  2006  . CARDIOVERSION N/A 02/22/2013   Procedure: CARDIOVERSION;  Surgeon: [name], MD;  Location: [facility] ENDOSCOPY;  Service: Cardiovascular;  Laterality: N/A;  . CARDIOVERSION N/A 11/15/2014   Procedure: CARDIOVERSION;  Surgeon: [name], MD;  Location: [place];  Service: Cardiovascular;  Laterality: N/A;  . CAROTID ENDARTERECTOMY  2009/ 1993   left/ right  . CORONARY ARTERY BYPASS GRAFT  1999  . EYE SURGERY     eyelid repair  . INGUINAL HERNIA REPAIR  02/11/2012   Procedure: LAPAROSCOPIC BILATERAL INGUINAL HERNIA REPAIR;  Surgeon: Pedro Earls, MD;  Location: [facility]S;  Service: General;  Laterality:  Bilateral;  . SKIN CANCER EXCISION     right ear x 3    Outpatient Medications Prior to Visit  Medication Sig Dispense Refill  . allopurinol (ZYLOPRIM) 300 MG tablet Take 0.5 tablets (150 mg total) by mouth daily. 45 tablet 3  . Aloe Vera 25 MG CAPS Take 2 capsules by mouth daily.    Marland Kitchen amLODipine (NORVASC) 2.5 MG tablet Take 1 tablet (2.5 mg total) by mouth daily. 90 tablet 0  . aspirin 81 MG tablet Take 81 mg by mouth at bedtime.     Marland Kitchen atorvastatin (LIPITOR) 20 MG tablet TAKE 1 TABLET AT BEDTIME 90 tablet 0  . carvedilol (COREG) 12.5 MG tablet TAKE 1 TABLET TWICE A DAY 180 tablet 1  . cholecalciferol (VITAMIN D) 1000 units tablet Take 5,000 Units by mouth daily.    . CHROMIUM GTF PO Take 2 capsules by mouth daily.    . Cinnamon 500 MG TABS Take 2 tablets by mouth at bedtime.     . cloNIDine (CATAPRES) 0.1 MG tablet Take 1 tablet (0.1 mg total) by mouth 2 (two) times daily as needed. 180 tablet 1  . Coenzyme Q10 (CO Q 10) 100 MG CAPS Take 1 capsule by mouth daily.     Marland Kitchen DARBEPOETIN ALFA-ALBUMIN IJ Inject as directed.    . dicyclomine (BENTYL) 10 MG capsule Take 1  capsule (10 mg total) by mouth 4 (four) times daily -  before meals and at bedtime. 120 capsule 0  . ELIQUIS 2.5 MG TABS tablet TAKE 1 TABLET TWICE A DAY 180 tablet 1  . fish oil-omega-3 fatty acids 1000 MG capsule Take 1 g by mouth daily.     Marland Kitchen glucose blood (PRODIGY NO CODING BLOOD GLUC) test strip Use as instructed once daily 100 each 12  . lisinopril (PRINIVIL,ZESTRIL) 40 MG tablet Take 1 tablet (40 mg total) by mouth daily. 90 tablet 1  . Multiple Vitamins-Minerals (PRESERVISION AREDS PO) Take 1 tablet by mouth 2 (two) times daily.     . nitroGLYCERIN (NITROSTAT) 0.4 MG SL tablet Place 0.4 mg under the tongue every 5 (five) minutes as needed (up to 3 doses only). For chest pain    . ONETOUCH DELICA LANCETS 50T MISC CHECK FASTING GLUCOSE ONCE DAILY 100 each 3  . Probiotic Product (PROBIOTIC PO) Take 1 tablet by mouth daily.     . tamsulosin (FLOMAX) 0.4 MG CAPS capsule Take 1 capsule (0.4 mg total) by mouth daily after supper. 90 capsule 1  . VANADYL SULFATE PO Take 1 capsule by mouth daily.    Marland Kitchen zinc gluconate 50 MG tablet Take 50 mg by mouth daily.     Marland Kitchen azithromycin (ZITHROMAX) 250 MG tablet      No facility-administered medications prior to visit.     Allergies  Allergen Reactions  . Penicillins Hives    ROS As per HPI  PE: Blood pressure (!) 165/53, pulse 63, temperature 97.7 F (36.5 C), temperature source Oral, resp. rate 16, height 5\' 8"  (1.727 m), weight 150 lb 8 oz (68.3 kg), SpO2 98 %. Gen: Alert, well appearing.  Patient is oriented to person, place, time, and situation. Left hand lateral aspect with 2 cm x 1 cm fluctuant blackish colored vesicle. Ecchymoses of various stages present on both forearms.  LABS:  none  IMPRESSION AND PLAN:  Blood vesicle; likely from anticoagulants + moderate, intermittent pressure with his golf grip recently. I used an 18G needle to puncture the vesicle after cleaning the skin with alcohol. Used pressure to express the blood from the vesicle, small amount of clot came out at the end. Vesicle membrane still intact but deflated.  Pt tolerated this well, no bleeding or immediate complications. Pressure dressing applied, home care discussed. Signs/symptoms to call or return for were reviewed and pt expressed understanding.  FOLLOW UP: Return if symptoms worsen or fail to improve.  Signed:  Crissie Sickles, MD           12/31/2016

## 2016-12-31 NOTE — Patient Instructions (Signed)
Apply fresh bandage to area on hand until the area is completely dry.

## 2017-01-08 ENCOUNTER — Other Ambulatory Visit (HOSPITAL_BASED_OUTPATIENT_CLINIC_OR_DEPARTMENT_OTHER): Payer: Medicare Other

## 2017-01-08 ENCOUNTER — Ambulatory Visit (HOSPITAL_BASED_OUTPATIENT_CLINIC_OR_DEPARTMENT_OTHER): Payer: Medicare Other | Admitting: Family

## 2017-01-08 ENCOUNTER — Ambulatory Visit (HOSPITAL_BASED_OUTPATIENT_CLINIC_OR_DEPARTMENT_OTHER): Payer: Medicare Other

## 2017-01-08 VITALS — BP 132/45 | HR 56 | Temp 97.8°F | Resp 18 | Wt 150.0 lb

## 2017-01-08 DIAGNOSIS — N189 Chronic kidney disease, unspecified: Secondary | ICD-10-CM

## 2017-01-08 DIAGNOSIS — D631 Anemia in chronic kidney disease: Secondary | ICD-10-CM | POA: Diagnosis not present

## 2017-01-08 DIAGNOSIS — D509 Iron deficiency anemia, unspecified: Secondary | ICD-10-CM

## 2017-01-08 DIAGNOSIS — D500 Iron deficiency anemia secondary to blood loss (chronic): Secondary | ICD-10-CM

## 2017-01-08 DIAGNOSIS — N183 Chronic kidney disease, stage 3 unspecified: Secondary | ICD-10-CM

## 2017-01-08 LAB — IRON AND TIBC
%SAT: 50 % (ref 20–55)
IRON: 112 ug/dL (ref 42–163)
TIBC: 224 ug/dL (ref 202–409)
UIBC: 112 ug/dL — AB (ref 117–376)

## 2017-01-08 LAB — CMP (CANCER CENTER ONLY)
ALK PHOS: 65 U/L (ref 26–84)
ALT: 33 U/L (ref 10–47)
AST: 32 U/L (ref 11–38)
Albumin: 3.3 g/dL (ref 3.3–5.5)
BUN: 47 mg/dL — AB (ref 7–22)
CHLORIDE: 116 meq/L — AB (ref 98–108)
CO2: 19 mEq/L (ref 18–33)
Calcium: 8.7 mg/dL (ref 8.0–10.3)
Creat: 1.9 mg/dl — ABNORMAL HIGH (ref 0.6–1.2)
Glucose, Bld: 140 mg/dL — ABNORMAL HIGH (ref 73–118)
POTASSIUM: 5 meq/L — AB (ref 3.3–4.7)
Sodium: 143 mEq/L (ref 128–145)
TOTAL PROTEIN: 6.6 g/dL (ref 6.4–8.1)
Total Bilirubin: 0.8 mg/dl (ref 0.20–1.60)

## 2017-01-08 LAB — CBC WITH DIFFERENTIAL (CANCER CENTER ONLY)
BASO#: 0 10*3/uL (ref 0.0–0.2)
BASO%: 0.2 % (ref 0.0–2.0)
EOS%: 2.1 % (ref 0.0–7.0)
Eosinophils Absolute: 0.1 10*3/uL (ref 0.0–0.5)
HEMATOCRIT: 32.3 % — AB (ref 38.7–49.9)
HGB: 10.9 g/dL — ABNORMAL LOW (ref 13.0–17.1)
LYMPH#: 1.9 10*3/uL (ref 0.9–3.3)
LYMPH%: 29.1 % (ref 14.0–48.0)
MCH: 30.9 pg (ref 28.0–33.4)
MCHC: 33.7 g/dL (ref 32.0–35.9)
MCV: 92 fL (ref 82–98)
MONO#: 0.7 10*3/uL (ref 0.1–0.9)
MONO%: 10.3 % (ref 0.0–13.0)
NEUT%: 58.3 % (ref 40.0–80.0)
NEUTROS ABS: 3.8 10*3/uL (ref 1.5–6.5)
PLATELETS: 153 10*3/uL (ref 145–400)
RBC: 3.53 10*6/uL — ABNORMAL LOW (ref 4.20–5.70)
RDW: 16.3 % — AB (ref 11.1–15.7)
WBC: 6.5 10*3/uL (ref 4.0–10.0)

## 2017-01-08 LAB — FERRITIN: FERRITIN: 484 ng/mL — AB (ref 22–316)

## 2017-01-08 MED ORDER — DARBEPOETIN ALFA 300 MCG/0.6ML IJ SOSY
PREFILLED_SYRINGE | INTRAMUSCULAR | Status: AC
  Filled 2017-01-08: qty 0.6

## 2017-01-08 MED ORDER — DARBEPOETIN ALFA 300 MCG/0.6ML IJ SOSY
300.0000 ug | PREFILLED_SYRINGE | Freq: Once | INTRAMUSCULAR | Status: AC
  Administered 2017-01-08: 300 ug via SUBCUTANEOUS

## 2017-01-08 NOTE — Progress Notes (Signed)
Hematology and Oncology Follow Up Visit  [name] [id number] 08-27-1928 83 y.o. 01/08/2017   Principle Diagnosis:  Erythropoietin deficiency anemia Iron deficiency anemia  Current Therapy:   IV iron as indicated Aranesp 300 g subcutaneous as needed for hemoglobin less than 11   Interim History:  [name] is here today for follow-up. He is doing quite well and has no complaints at this time. His Hgb is 10.9 so we will proceed with his Aranesp injection.  He has had no episodes of bleeding. He is on Eliquis and aspirin and will bruise easily.  No fever, chills, n/v, cough, rash, dizziness, SOB, chest pain, palpitations, abdominal pain or changes in bowel or bladder habits.  He has had some diarrhea and states that he is "working on this" with his PCP.  No swelling, tenderness, numbness or tingling in her extremities. No c/o pain.  He has maintained a good appetite and is staying well hydrated. His weight is stable.  He is staying active and still enjoys playing golf.   ECOG Performance Status: 1 - Symptomatic but completely ambulatory  Medications:  Allergies as of 01/08/2017      Reactions   Penicillins Hives      Medication List       Accurate as of 01/08/17 10:21 AM. Always use your most recent med list.          allopurinol 300 MG tablet Commonly known as:  ZYLOPRIM Take 0.5 tablets (150 mg total) by mouth daily.   Aloe Vera 25 MG Caps Take 2 capsules by mouth daily.   amLODipine 2.5 MG tablet Commonly known as:  NORVASC Take 1 tablet (2.5 mg total) by mouth daily.   aspirin 81 MG tablet Take 81 mg by mouth at bedtime.   atorvastatin 20 MG tablet Commonly known as:  LIPITOR TAKE 1 TABLET AT BEDTIME   carvedilol 12.5 MG tablet Commonly known as:  COREG TAKE 1 TABLET TWICE A DAY   cholecalciferol 1000 units tablet Commonly known as:  VITAMIN D Take 5,000 Units by mouth daily.   CHROMIUM GTF PO Take 2 capsules by mouth daily.   Cinnamon 500  MG Tabs Take 2 tablets by mouth at bedtime.   cloNIDine 0.1 MG tablet Commonly known as:  CATAPRES Take 1 tablet (0.1 mg total) by mouth 2 (two) times daily as needed.   Co Q 10 100 MG Caps Take 1 capsule by mouth daily.   DARBEPOETIN ALFA-ALBUMIN IJ Inject as directed.   dicyclomine 10 MG capsule Commonly known as:  BENTYL Take 1 capsule (10 mg total) by mouth 4 (four) times daily -  before meals and at bedtime.   ELIQUIS 2.5 MG Tabs tablet Generic drug:  apixaban TAKE 1 TABLET TWICE A DAY   fish oil-omega-3 fatty acids 1000 MG capsule Take 1 g by mouth daily.   glucose blood test strip Commonly known as:  PRODIGY NO CODING BLOOD GLUC Use as instructed once daily   lisinopril 40 MG tablet Commonly known as:  PRINIVIL,ZESTRIL Take 1 tablet (40 mg total) by mouth daily.   nitroGLYCERIN 0.4 MG SL tablet Commonly known as:  NITROSTAT Place 0.4 mg under the tongue every 5 (five) minutes as needed (up to 3 doses only). For chest pain   ONETOUCH DELICA LANCETS 70J Misc CHECK FASTING GLUCOSE ONCE DAILY   PRESERVISION AREDS PO Take 1 tablet by mouth 2 (two) times daily.   PROBIOTIC PO Take 1 tablet by mouth daily.   tamsulosin  0.4 MG Caps capsule Commonly known as:  FLOMAX Take 1 capsule (0.4 mg total) by mouth daily after supper.   VANADYL SULFATE PO Take 1 capsule by mouth daily.   zinc gluconate 50 MG tablet Take 50 mg by mouth daily.       Allergies:  Allergies  Allergen Reactions  . Penicillins Hives    Past Medical History, Surgical history, Social history, and Family History were reviewed and updated.  Review of Systems: All other 10 point review of systems is negative.   Physical Exam:  weight is 150 lb (68 kg). His oral temperature is 97.8 F (36.6 C). His blood pressure is 132/45 (abnormal) and his pulse is 56 (abnormal). His respiration is 18 and oxygen saturation is 100%.   Wt Readings from Last 3 Encounters:  01/08/17 150 lb (68 kg)    12/31/16 150 lb 8 oz (68.3 kg)  12/10/16 151 lb (68.5 kg)    Ocular: Sclerae unicteric, pupils equal, round and reactive to light Ear-nose-throat: Oropharynx clear, dentition fair Lymphatic: No cervical, supraclavicular or axillary adenopathy Lungs no rales or rhonchi, good excursion bilaterally Heart regular rate and rhythm, no murmur appreciated Abd soft, nontender, positive bowel sounds, no liver or spleen tip palpated on exam, no fluid wave MSK no focal spinal tenderness, no joint edema Neuro: non-focal, well-oriented, appropriate affect Breasts: Deferred   Lab Results  Component Value Date   WBC 6.5 01/08/2017   HGB 10.9 (L) 01/08/2017   HCT 32.3 (L) 01/08/2017   MCV 92 01/08/2017   PLT 153 01/08/2017   Lab Results  Component Value Date   FERRITIN 378 (H) 10/16/2016   IRON 62 12/04/2016   TIBC 229 (L) 12/04/2016   UIBC 167 12/04/2016   IRONPCTSAT 27 12/04/2016   Lab Results  Component Value Date   RBC 3.53 (L) 01/08/2017   No results found for: KPAFRELGTCHN, LAMBDASER, KAPLAMBRATIO No results found for: Kandis Cocking, IGMSERUM Lab Results  Component Value Date   TOTALPROTELP 6.0 (L) 12/26/2015   ALBUMINELP 3.4 (L) 12/26/2015   A1GS 0.4 (H) 12/26/2015   A2GS 0.8 12/26/2015   BETS 0.4 12/26/2015   BETA2SER 0.3 12/26/2015   GAMS 0.8 12/26/2015   MSPIKE Not Observed 01/29/2016   SPEI SEE NOTE 12/26/2015     Chemistry      Component Value Date/Time   NA 143 01/08/2017 0947   NA 140 03/28/2016 0853   K 5.0 (H) 01/08/2017 0947   K 4.4 03/28/2016 0853   CL 116 (H) 01/08/2017 0947   CO2 19 01/08/2017 0947   CO2 19 (L) 03/28/2016 0853   BUN 47 (H) 01/08/2017 0947   BUN 34.4 (H) 03/28/2016 0853   CREATININE 1.9 (H) 01/08/2017 0947   CREATININE 1.7 (H) 03/28/2016 0853   GLU 113 07/24/2016      Component Value Date/Time   CALCIUM 8.7 01/08/2017 0947   CALCIUM 9.2 03/28/2016 0853   ALKPHOS 65 01/08/2017 0947   ALKPHOS 85 03/28/2016 0853   AST 32  01/08/2017 0947   AST 23 03/28/2016 0853   ALT 33 01/08/2017 0947   ALT 37 03/28/2016 0853   BILITOT 0.80 01/08/2017 0947   BILITOT 0.79 03/28/2016 0853      Impression and Plan: [name] is a very pleasant 83 yo caucasian gentleman with both erythropoietin deficiency anemia and iron deficiency anemia. He is asymptomatic at this time.  His Hgb is 10.9 so we will proceed with Aranesp.  We will see what his iron  studies show and bring him back in next week for an infusion if needed.  We will plan to see him back in another 3 months for repeat lab work and follow-up.  He will contact our office with any questions or concerns. We can certainly see him sooner if need be.   Eliezer Bottom, NP 9/13/201810:21 AM

## 2017-01-09 LAB — RETICULOCYTES: RETICULOCYTE COUNT: 2 % (ref 0.6–2.6)

## 2017-01-15 ENCOUNTER — Encounter: Payer: Self-pay | Admitting: Family Medicine

## 2017-01-15 ENCOUNTER — Telehealth: Payer: Self-pay | Admitting: *Deleted

## 2017-01-15 ENCOUNTER — Ambulatory Visit (INDEPENDENT_AMBULATORY_CARE_PROVIDER_SITE_OTHER): Payer: Medicare Other | Admitting: Family Medicine

## 2017-01-15 VITALS — BP 157/65 | HR 50 | Temp 98.0°F | Resp 20 | Ht 68.0 in | Wt 152.5 lb

## 2017-01-15 DIAGNOSIS — E7800 Pure hypercholesterolemia, unspecified: Secondary | ICD-10-CM

## 2017-01-15 DIAGNOSIS — I480 Paroxysmal atrial fibrillation: Secondary | ICD-10-CM

## 2017-01-15 DIAGNOSIS — I10 Essential (primary) hypertension: Secondary | ICD-10-CM

## 2017-01-15 DIAGNOSIS — N183 Chronic kidney disease, stage 3 unspecified: Secondary | ICD-10-CM

## 2017-01-15 DIAGNOSIS — R7303 Prediabetes: Secondary | ICD-10-CM

## 2017-01-15 LAB — POCT GLYCOSYLATED HEMOGLOBIN (HGB A1C): HEMOGLOBIN A1C: 5.7

## 2017-01-15 MED ORDER — AMLODIPINE BESYLATE 2.5 MG PO TABS: 2.5000 mg | ORAL_TABLET | Freq: Every day | ORAL | 1 refills | Status: DC

## 2017-01-15 MED ORDER — ATORVASTATIN CALCIUM 20 MG PO TABS: 20.0000 mg | ORAL_TABLET | Freq: Every day | ORAL | 3 refills | Status: DC

## 2017-01-15 MED ORDER — LISINOPRIL 40 MG PO TABS: 40.0000 mg | ORAL_TABLET | Freq: Every day | ORAL | 1 refills | Status: DC

## 2017-01-15 NOTE — Progress Notes (Signed)
RIDER ERMIS , 1928/10/28, 83 y.o., male MRN: [id number] Patient Care Team    Relationship Specialty Notifications Start End  [name], DO PCP - General Family Medicine  12/18/15   [name], MD Consulting Physician Ophthalmology  12/18/15   [name], MD Consulting Physician Dermatology  12/18/15   [name], MD Consulting Physician Cardiology  12/18/15   [name], MD Consulting Physician Ophthalmology  12/19/15   [name], MD Consulting Physician Oncology  09/30/16   [name], MD Attending Physician Nephrology  09/30/16   [name], MD Consulting Physician Dermatology  09/30/16     Chief Complaint  Patient presents with  . Hypertension    Subjective:  Hypertension/CKD3/A.Fib:  Pt reports compliance with lisinopril 40 mg QD, Coreg 12.5 BID, Amlodipine 2.5 mg daily and he is still taking clonidine 0.1 mg at night. Blood pressures ranges at home are labile between 109-159/38-68. Patient denies chest pain, shortness of breath or lower extremity edema. He does endorse feeling fatigued when the diastolics are lower and. Pt daily baby ASA and liquids. Pt is  prescribed statin. At home he reports heart rates as low as 38- 69 BMP: 01/08/2017 with mildly elevated creatinine at 1.9, his baseline approximately 1.75 CBC: 01/08/2017 CBC baseline for him with his iron deficiency anemia and anemia of chronic disease. Lipid: LDL collected today, patient not fasting PTH/calcium/vitamin D: August 2017, within normal limits. Collected today. Diet: Low sodium diet followed Exercise: very active RF: CKD3, HLD, CAD w/ CABG (1999), cardiac cath 2011 2/2 CP (all grafts normal), PVD, Afib   Prediabetes: Patient has a history of diabetes, that was well controlled by diet. He has had a normal A1c one year ago at 5.7. He is very active, and watches his diet closely.  Allergies  Allergen Reactions  . Penicillins Hives   Social History  Substance Use Topics  .  Smoking status: Former Smoker    Quit date: 12/25/1961  . Smokeless tobacco: Former Systems developer    Types: Chew    Quit date: 02/03/1969  . Alcohol use No   Past Medical History:  Diagnosis Date  . Basal cell carcinoma    skin  . Bigeminal rhythm   . Cataract   . Chronic renal insufficiency, stage 3 (moderate) 2018   GFR 30s-40s  . Coronary artery disease    post bypass  . CVD (cardiovascular disease)   . Diabetes mellitus without complication ([place])   . Diverticular disease   . Easy bruising   . 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 Gout   . Hernia   . Hyperlipidemia   . Hypertension   . Iron deficiency anemia 02/04/2016  . Macular degeneration   . Microscopic colitis   . PVD (peripheral vascular disease) ([place])    Past Surgical History:  Procedure Laterality Date  . CARDIAC CATHETERIZATION  2006  . CARDIOVERSION N/A 02/22/2013   Procedure: CARDIOVERSION;  Surgeon: [name], MD;  Location[facility] ENDOSCOPY;  Service: Cardiovascular;  Laterality: N/A;  . CARDIOVERSION N/A 11/15/2014   Procedure: CARDIOVERSION;  Surgeon: [name], MD;  Location: [place];  Service: Cardiovascular;  Laterality: N/A;  . CAROTID ENDARTERECTOMY  2009/ 1993   left/ right  . CORONARY ARTERY BYPASS GRAFT  1999  . EYE SURGERY     eyelid repair  . INGUINAL HERNIA REPAIR  02/11/2012   Procedure: LAPAROSCOPIC BILATERAL INGUINAL HERNIA REPAIR;  Surgeon: Pedro Earls, MD;  Location: [facility];  Service: General;  Laterality: Bilateral;  . SKIN CANCER EXCISION     right ear x 3   Family History  Problem Relation Age of Onset  . Stroke Mother   . Coronary artery disease Father   . Heart disease Father   . Melanoma Sister    Allergies as of 01/15/2017      Reactions   Penicillins Hives      Medication List       Accurate as of 01/15/17 12:38 PM. Always use your most recent med list.          allopurinol 300 MG tablet Commonly known as:  ZYLOPRIM Take 0.5 tablets (150 mg  total) by mouth daily.   Aloe Vera 25 MG Caps Take 2 capsules by mouth daily.   amLODipine 2.5 MG tablet Commonly known as:  NORVASC Take 1 tablet (2.5 mg total) by mouth daily.   aspirin 81 MG tablet Take 81 mg by mouth at bedtime.   atorvastatin 20 MG tablet Commonly known as:  LIPITOR Take 1 tablet (20 mg total) by mouth at bedtime.   carvedilol 12.5 MG tablet Commonly known as:  COREG TAKE 1 TABLET TWICE A DAY   cholecalciferol 1000 units tablet Commonly known as:  VITAMIN D Take 5,000 Units by mouth daily.   CHROMIUM GTF PO Take 2 capsules by mouth daily.   Cinnamon 500 MG Tabs Take 2 tablets by mouth at bedtime.   Co Q 10 100 MG Caps Take 1 capsule by mouth daily.   DARBEPOETIN ALFA-ALBUMIN IJ Inject as directed.   dicyclomine 10 MG capsule Commonly known as:  BENTYL Take 1 capsule (10 mg total) by mouth 4 (four) times daily -  before meals and at bedtime.   ELIQUIS 2.5 MG Tabs tablet Generic drug:  apixaban TAKE 1 TABLET TWICE A DAY   fish oil-omega-3 fatty acids 1000 MG capsule Take 1 g by mouth daily.   glucose blood test strip Commonly known as:  PRODIGY NO CODING BLOOD GLUC Use as instructed once daily   lisinopril 40 MG tablet Commonly known as:  PRINIVIL,ZESTRIL Take 1 tablet (40 mg total) by mouth daily.   nitroGLYCERIN 0.4 MG SL tablet Commonly known as:  NITROSTAT Place 0.4 mg under the tongue every 5 (five) minutes as needed (up to 3 doses only). For chest pain   ONETOUCH DELICA LANCETS 66A Misc CHECK FASTING GLUCOSE ONCE DAILY   PRESERVISION AREDS PO Take 1 tablet by mouth 2 (two) times daily.   PROBIOTIC PO Take 1 tablet by mouth daily.   tamsulosin 0.4 MG Caps capsule Commonly known as:  FLOMAX Take 1 capsule (0.4 mg total) by mouth daily after supper.   VANADYL SULFATE PO Take 1 capsule by mouth daily.   zinc gluconate 50 MG tablet Take 50 mg by mouth daily.            Discharge Care Instructions        Start      Ordered   01/15/17 0000  POCT HgB A1C     01/15/17 0953   01/15/17 0000  lisinopril (PRINIVIL,ZESTRIL) 40 MG tablet  Daily     01/15/17 1016   01/15/17 0000  atorvastatin (LIPITOR) 20 MG tablet  Daily at bedtime     01/15/17 1017   01/15/17 0000  amLODipine (NORVASC) 2.5 MG tablet  Daily     01/15/17 1017   01/15/17 0000  Direct LDL     01/15/17 1019   01/15/17  0000  PTH, Intact and Calcium     01/15/17 1019   01/15/17 0000  Vitamin D (25 hydroxy)     01/15/17 1019   01/15/17 3151  BASIC METABOLIC PANEL WITH GFR     01/15/17 1019      Results for orders placed or performed in visit on 01/15/17 (from the past 24 hour(s))  POCT HgB A1C     Status: Abnormal   Collection Time: 01/15/17  9:58 AM  Result Value Ref Range   Hemoglobin A1C 5.7    No results found.   ROS: Negative, with the exception of above mentioned in HPI   Objective:  BP (!) 157/65 (BP Location: Right Arm, Patient Position: Sitting, Cuff Size: Normal)   Pulse (!) 50   Temp 98 F (36.7 C)   Resp 20   Ht 5\' 8"  (1.727 m)   Wt 152 lb 8 oz (69.2 kg)   SpO2 100%   BMI 23.19 kg/m  Body mass index is 23.19 kg/m. Gen: Afebrile. No acute distress. Nontoxic in appearance, well-developed, well-nourished, Caucasian male. Very pleasant. HENT: AT. [place].  MMM.  Eyes:Pupils Equal Round Reactive to light, Extraocular movements intact,  Conjunctiva without redness, discharge or icterus. CV: Mild bradycardia on exam, no edema, diminished but equal pulses BLE Chest: CTAB, no wheeze or crackles Abd: Soft. NTND. BS present.  Skin: no rashes, purpura or petechiae.  Neuro: Normal gait. PERLA. EOMi. Alert. Oriented x3  Assessment/Plan: DEONDREA MARKOS is a 83 y.o. male present for OV for  Essential hypertension/Paroxysmal atrial fibrillation (HCC)/Stage 3 chronic kidney disease/HLD - BP above goal today, however most all blood  from home have a normal systolic reading, his diastolics are concerning as low as 38 on a few  occasions. He also records lower than desired heart rate and endorses symptoms at these times. His recordings of blood pressures in different settings over the last month 3 do not show evidence of low blood pressures, but today his heart rate is slower and prior. - continue low salt diet and exercise.  - Decrease Coreg to 6.25 mg BID. He believes he still has this dose at home. If he does have the 6.25 mg of Coreg at home he will use that for 1 week twice a day and then call in his blood pressures and heart rate. If he does not have this medication at home, we will need to call in a 30 day prescription to local pharmacy until were able to get a stable regimen, and then will provide 90 day prescription to mail in pharmacy. -  He will monitor HR and BP, goal HR 60-70, BP 110-140/60-85 with him. He is very difficult to control and his hydration and IDA/epo injections cause fluctuations leading to both highs and lower than desired readings. Certainly would rather mildly elevated BP over too low.  - Continue Lisinopril 40 mg daily Qd - Direct LDL - PTH, Intact and Calcium - Vitamin D (25 hydroxy) - BASIC METABOLIC PANEL WITH GFR   f/u 3-4  months until stable regimen.   Prediabetes - POCT HgB A1C--> 5.7 today. Continue to monitor q 6-12 months. He has been stable.     electronically signed by:  Howard Pouch, DO  [place]

## 2017-01-15 NOTE — Patient Instructions (Addendum)
Do not use clonidine unless BP > 190/100, hopefully this medication will not be needed any longer.  I want to decrease your Coreg to 6.25 mg every 12 hours (if you need this called in we can call into [facility] until we figure out dose). Monitor BP and HR for one week and report to [place].  I would like to see your bottom number (diastolic around 60 at least). We also want to get your heart rate to be about 60 as well. If still low after changes made today, may cut back even further on Coreg.   I will call you with labs once resulted.   Make sure to stay hydrated.    Follow in 3 months.

## 2017-01-15 NOTE — Telephone Encounter (Signed)
Pt LMOM on 01/15/17 at 2:33pm stating that Dr. Raoul Pitch wanted to decrease his carvedilol to 6.25mg . He stated that the Rx he has at home expired on 12/28/16. He stated that he will need new Rx sent [facility]. Please advise. Thanks.

## 2017-01-16 ENCOUNTER — Encounter: Payer: Self-pay | Admitting: *Deleted

## 2017-01-16 IMAGING — CR DG CHEST 2V
2 series · 2 of 2 positions shown · non-contrast
Comparison: 06/15/2011

CLINICAL DATA: Preop for cardiac ablation

EXAM:
CHEST  2 VIEW

[w chest pa]
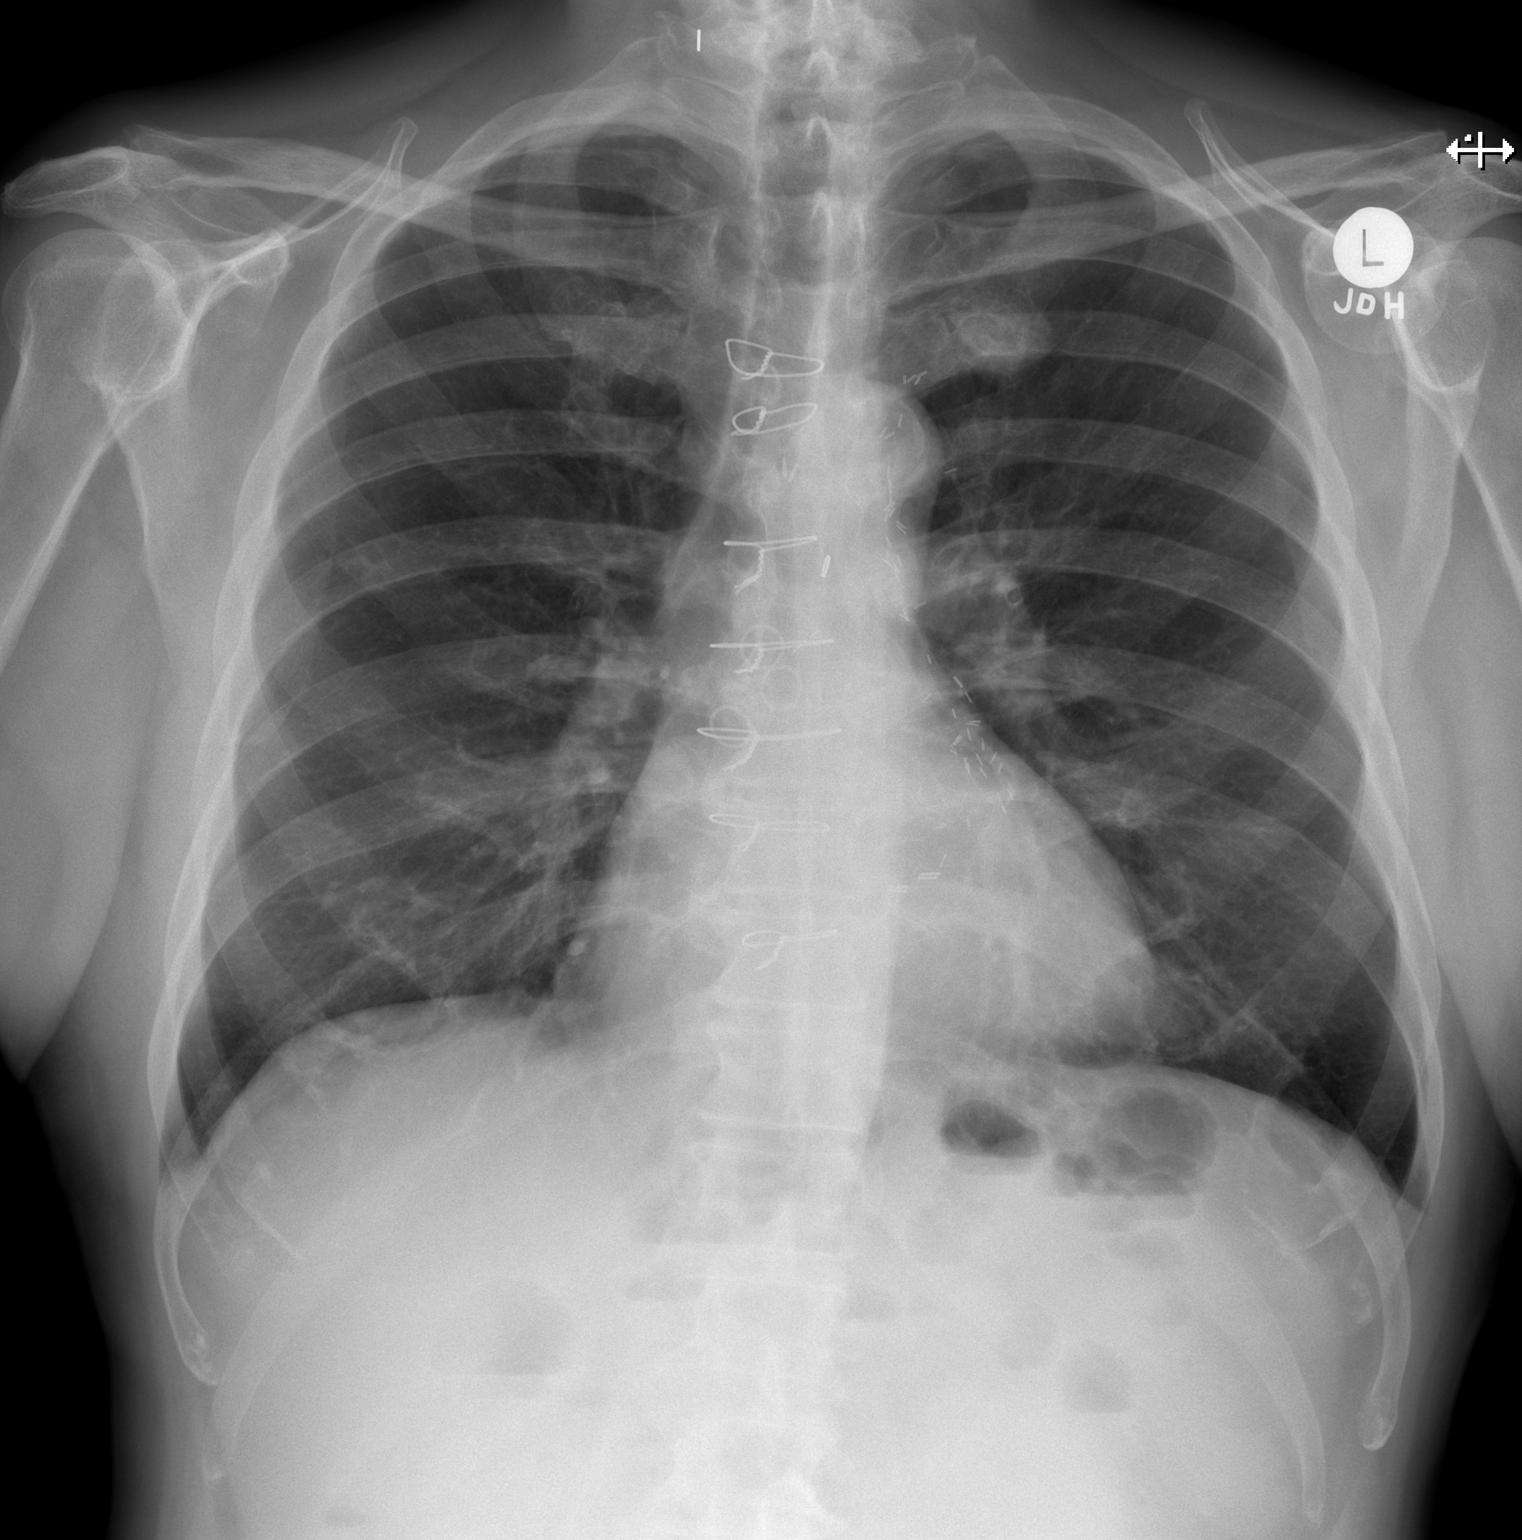

[w chest lat]
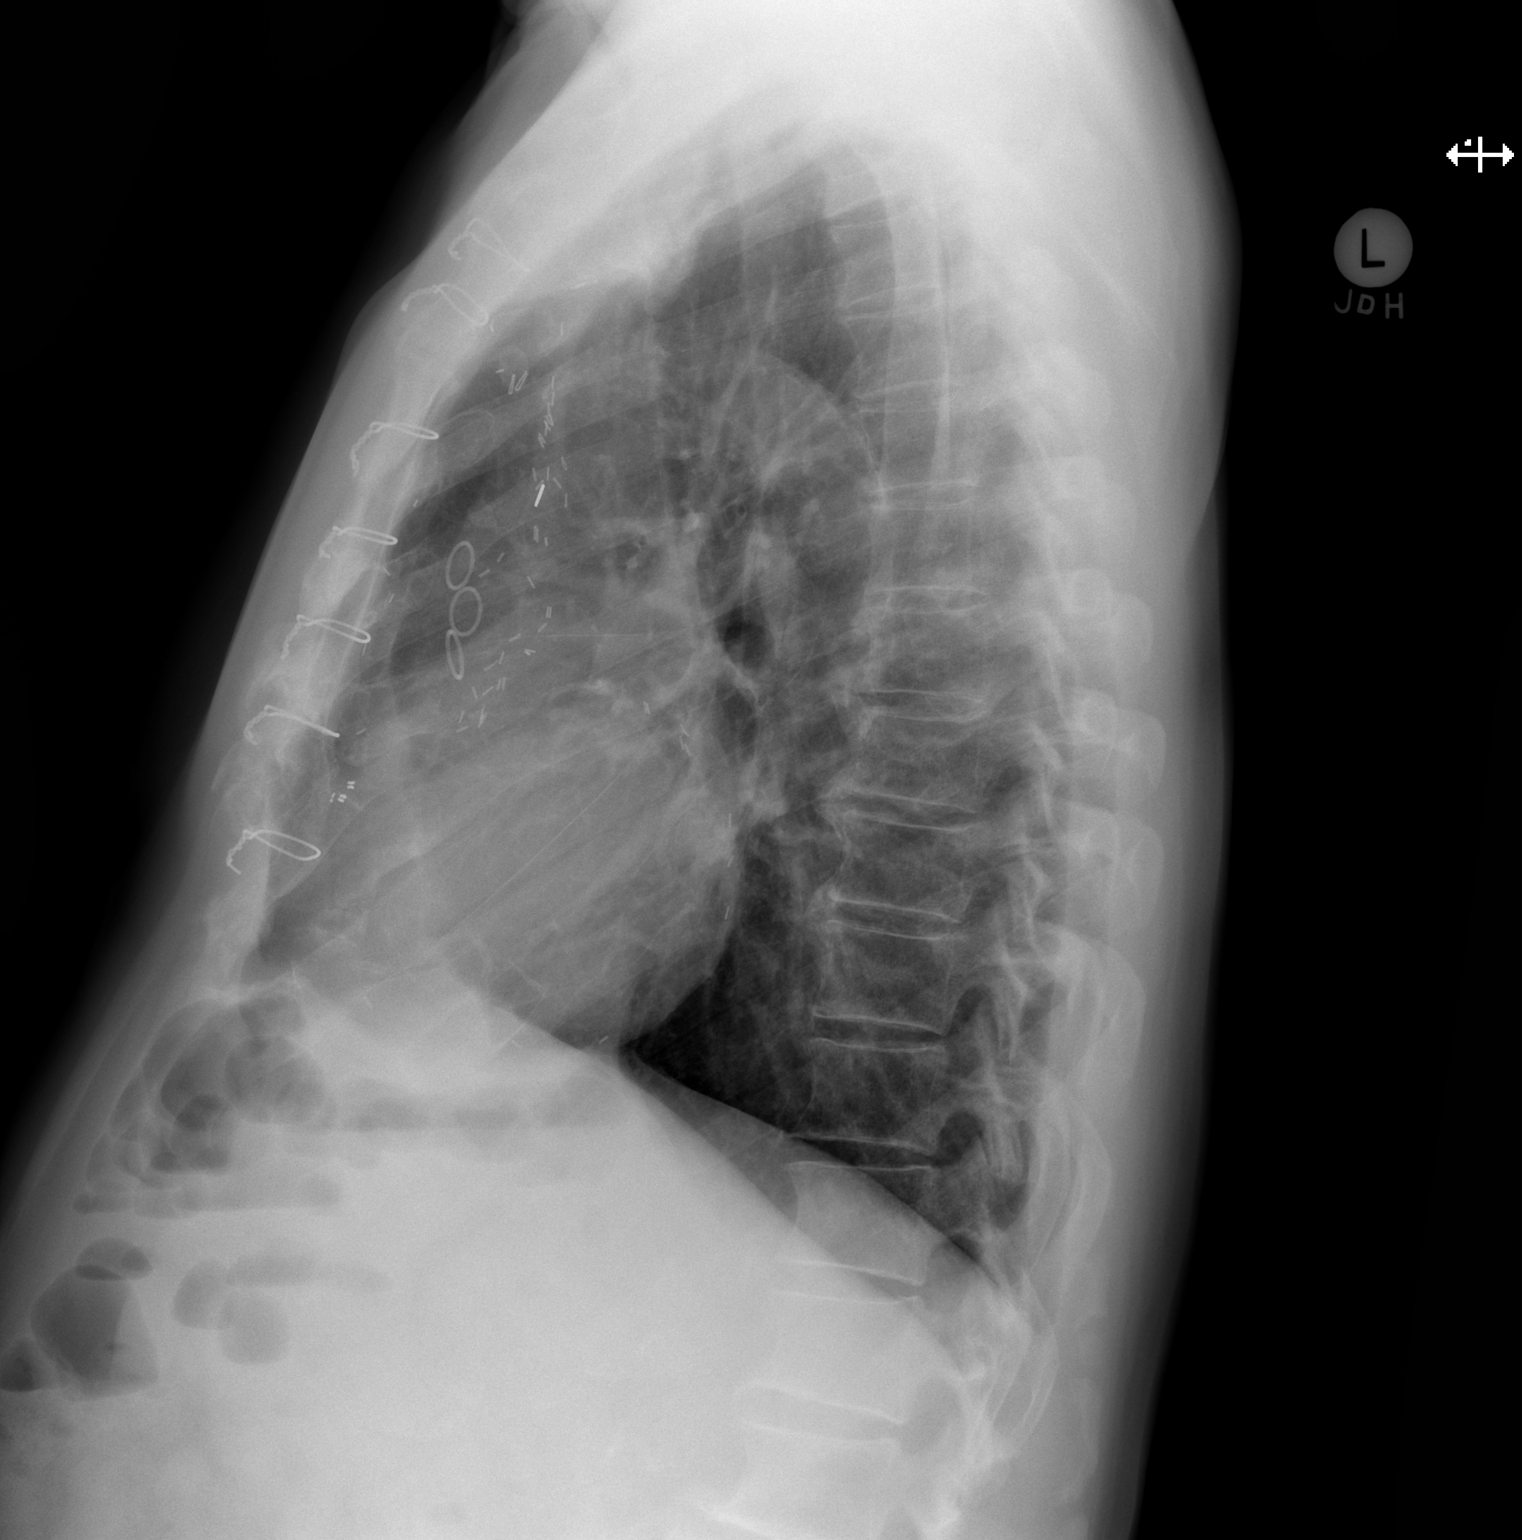

[2 of 2 positions shown; findings below may reference images not displayed]

FINDINGS: Cardiomediastinal silhouette is stable. Status post CABG. No acute
infiltrate or pleural effusion. No pulmonary edema. Degenerative
changes mid thoracic spine.
IMPRESSION: No active disease.  Status post CABG.

## 2017-01-16 MED ORDER — CARVEDILOL 6.25 MG PO TABS: 6.2500 mg | ORAL_TABLET | Freq: Two times a day (BID) | ORAL | 0 refills | Status: DC

## 2017-01-16 NOTE — Telephone Encounter (Signed)
Rx sent to patient local pharmacy .

## 2017-01-17 LAB — BASIC METABOLIC PANEL WITH GFR
BUN/Creatinine Ratio: 20 (calc) (ref 6–22)
BUN: 46 mg/dL — ABNORMAL HIGH (ref 7–25)
CHLORIDE: 112 mmol/L — AB (ref 98–110)
CO2: 19 mmol/L — AB (ref 20–32)
CREATININE: 2.27 mg/dL — AB (ref 0.70–1.11)
Calcium: 8.6 mg/dL (ref 8.6–10.3)
GFR, Est African American: 29 mL/min/{1.73_m2} — ABNORMAL LOW (ref >=60)
GFR, Est Non African American: 25 mL/min/{1.73_m2} — ABNORMAL LOW (ref >=60)
GLUCOSE: 112 mg/dL — AB (ref 65–99)
Potassium: 4.9 mmol/L (ref 3.5–5.3)
SODIUM: 141 mmol/L (ref 135–146)

## 2017-01-17 LAB — PTH, INTACT AND CALCIUM
Calcium: 8.6 mg/dL (ref 8.6–10.3)
PTH: 58 pg/mL (ref 14–64)

## 2017-01-17 LAB — LDL CHOLESTEROL, DIRECT: LDL DIRECT: 49 mg/dL (ref <100)

## 2017-01-17 LAB — VITAMIN D 25 HYDROXY (VIT D DEFICIENCY, FRACTURES): Vit D, 25-Hydroxy: 39 ng/mL (ref 30–100)

## 2017-01-18 ENCOUNTER — Other Ambulatory Visit: Payer: Self-pay | Admitting: Cardiovascular Disease

## 2017-01-19 NOTE — Telephone Encounter (Signed)
Request received for Eliquis 2.5mg ; pt is 83 yrs old, wt-69.2kg, Crea-2.27 on 01/15/17, last seen by Dr. Johnsie Cancel on 10/06/16. Will send in refill request to requested pharmacy.

## 2017-01-20 ENCOUNTER — Ambulatory Visit (INDEPENDENT_AMBULATORY_CARE_PROVIDER_SITE_OTHER): Payer: Medicare Other | Admitting: Family Medicine

## 2017-01-20 ENCOUNTER — Encounter: Payer: Self-pay | Admitting: Family Medicine

## 2017-01-20 VITALS — BP 155/61 | HR 52 | Temp 98.0°F | Resp 20 | Wt 155.5 lb

## 2017-01-20 DIAGNOSIS — I10 Essential (primary) hypertension: Secondary | ICD-10-CM | POA: Diagnosis not present

## 2017-01-20 DIAGNOSIS — N183 Chronic kidney disease, stage 3 unspecified: Secondary | ICD-10-CM

## 2017-01-20 DIAGNOSIS — D631 Anemia in chronic kidney disease: Secondary | ICD-10-CM

## 2017-01-20 NOTE — Patient Instructions (Signed)
Nurse visit with labs Thursday or Friday. Bring your cuff with that day so we can check it.  Continue monitoring BP 2 times a day.

## 2017-01-20 NOTE — Progress Notes (Signed)
[name] , [birth year], 83 y.o., male MRN: [id number] Patient Care Team    Relationship Specialty Notifications Start End  [name], DO PCP - General Family Medicine  12/18/15   [name], MD Consulting Physician Ophthalmology  12/18/15   [name], MD Consulting Physician Dermatology  12/18/15   [name], MD Consulting Physician Cardiology  12/18/15   [name], MD Consulting Physician Ophthalmology  12/19/15   [name], MD Consulting Physician Oncology  09/30/16   [name], MD Attending Physician Nephrology  09/30/16   [name], MD Consulting Physician Dermatology  09/30/16     Chief Complaint  Patient presents with  . Hypertension    Subjective:  Hypertension/CKD3/A.Fib:  Patient returns today with blood pressure readings ranging from 142-163/42-56 and heart rate 39-60, most heart rates in the 50s. He has stopped the clonidine as requested. He has started the lower dose Coreg at 6.25 mg twice a day. He states over the last 2-3 days he has noticed his blood pressure seemed to be regulating out better, most are in the 716R CVELFYBO/17P diastolic. He still has some lower and diastolic readings and heart rate readings. He denies any chest pain, shortness of breath, lower extremity edema or dizziness.  Prior note 01/15/2017 Pt reports compliance with lisinopril 40 mg QD, Coreg 12.5 BID, Amlodipine 2.5 mg daily and he is still taking clonidine 0.1 mg at night. Blood pressures ranges at home are labile between 109-159/38-68. Patient denies chest pain, shortness of breath or lower extremity edema. He does endorse feeling fatigued when the diastolics are lower and. Pt daily baby ASA and liquids. Pt is  prescribed statin. At home he reports heart rates as low as 38- 69 BMP: 01/08/2017 with mildly elevated creatinine at 1.9, his baseline approximately 1.75 CBC: 01/08/2017 CBC baseline for him with his iron deficiency anemia and anemia of chronic  disease. Lipid: LDL collected today, patient not fasting PTH/calcium/vitamin D: August 2017, within normal limits. Collected today. Diet: Low sodium diet followed Exercise: very active RF: CKD3, HLD, CAD w/ CABG (1999), cardiac cath 2011 2/2 CP (all grafts normal), PVD, Afib    Allergies  Allergen Reactions  . Penicillins Hives   Social History  Substance Use Topics  . Smoking status: Former Smoker    Quit date: 12/25/1961  . Smokeless tobacco: Former Systems developer    Types: Chew    Quit date: 02/03/1969  . Alcohol use No   Past Medical History:  Diagnosis Date  . Basal cell carcinoma    skin  . Bigeminal rhythm   . Cataract   . Chronic renal insufficiency, stage 3 (moderate) 2018   GFR 30s-40s  . Coronary artery disease    post bypass  . CVD (cardiovascular disease)   . Diabetes mellitus without complication ([place])   . Diverticular disease   . Easy bruising   . 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 Gout   . Hernia   . Hyperlipidemia   . Hypertension   . Iron deficiency anemia 02/04/2016  . Macular degeneration   . Microscopic colitis   . PVD (peripheral vascular disease) ([place])    Past Surgical History:  Procedure Laterality Date  . CARDIAC CATHETERIZATION  2006  . CARDIOVERSION N/A 02/22/2013   Procedure: CARDIOVERSION;  Surgeon: [name], MD;  Location: [facility] ENDOSCOPY;  Service: Cardiovascular;  Laterality: N/A;  . CARDIOVERSION N/A 11/15/2014   Procedure: CARDIOVERSION;  Surgeon: [name], MD;  Location:  [facility] ENDOSCOPY;  Service: Cardiovascular;  Laterality: N/A;  . CAROTID ENDARTERECTOMY  2009/ 1993   left/ right  . CORONARY ARTERY BYPASS GRAFT  1999  . EYE SURGERY     eyelid repair  . INGUINAL HERNIA REPAIR  02/11/2012   Procedure: LAPAROSCOPIC BILATERAL INGUINAL HERNIA REPAIR;  Surgeon: Pedro Earls, MD;  Location: [facility];  Service: General;  Laterality: Bilateral;  . SKIN CANCER EXCISION     right ear x 3   Family History  Problem Relation  Age of Onset  . Stroke Mother   . Coronary artery disease Father   . Heart disease Father   . Melanoma Sister    Allergies as of 01/20/2017      Reactions   Penicillins Hives      Medication List       Accurate as of 01/20/17  1:28 PM. Always use your most recent med list.          allopurinol 300 MG tablet Commonly known as:  ZYLOPRIM Take 0.5 tablets (150 mg total) by mouth daily.   Aloe Vera 25 MG Caps Take 2 capsules by mouth daily.   amLODipine 2.5 MG tablet Commonly known as:  NORVASC Take 1 tablet (2.5 mg total) by mouth daily.   aspirin 81 MG tablet Take 81 mg by mouth at bedtime.   atorvastatin 20 MG tablet Commonly known as:  LIPITOR Take 1 tablet (20 mg total) by mouth at bedtime.   carvedilol 6.25 MG tablet Commonly known as:  COREG Take 1 tablet (6.25 mg total) by mouth 2 (two) times daily with a meal.   cholecalciferol 1000 units tablet Commonly known as:  VITAMIN D Take 5,000 Units by mouth daily.   CHROMIUM GTF PO Take 2 capsules by mouth daily.   Cinnamon 500 MG Tabs Take 2 tablets by mouth at bedtime.   Co Q 10 100 MG Caps Take 1 capsule by mouth daily.   DARBEPOETIN ALFA-ALBUMIN IJ Inject as directed.   dicyclomine 10 MG capsule Commonly known as:  BENTYL Take 1 capsule (10 mg total) by mouth 4 (four) times daily -  before meals and at bedtime.   ELIQUIS 2.5 MG Tabs tablet Generic drug:  apixaban TAKE 1 TABLET TWICE A DAY   fish oil-omega-3 fatty acids 1000 MG capsule Take 1 g by mouth daily.   glucose blood test strip Commonly known as:  PRODIGY NO CODING BLOOD GLUC Use as instructed once daily   lisinopril 40 MG tablet Commonly known as:  PRINIVIL,ZESTRIL Take 1 tablet (40 mg total) by mouth daily.   nitroGLYCERIN 0.4 MG SL tablet Commonly known as:  NITROSTAT Place 0.4 mg under the tongue every 5 (five) minutes as needed (up to 3 doses only). For chest pain   ONETOUCH DELICA LANCETS 27O Misc CHECK FASTING GLUCOSE  ONCE DAILY   PRESERVISION AREDS PO Take 1 tablet by mouth 2 (two) times daily.   PROBIOTIC PO Take 1 tablet by mouth daily.   tamsulosin 0.4 MG Caps capsule Commonly known as:  FLOMAX Take 1 capsule (0.4 mg total) by mouth daily after supper.   VANADYL SULFATE PO Take 1 capsule by mouth daily.   zinc gluconate 50 MG tablet Take 50 mg by mouth daily.       No results found for this or any previous visit (from the past 24 hour(s)). No results found.   ROS: Negative, with the exception of above mentioned in HPI   Objective:  BP Marland Kitchen)  155/61 (BP Location: Left Arm, Patient Position: Sitting, Cuff Size: Normal)   Pulse (!) 52   Temp 98 F (36.7 C)   Resp 20   Wt 155 lb 8 oz (70.5 kg)   SpO2 98%   BMI 23.64 kg/m  Body mass index is 23.64 kg/m. Gen: Afebrile. No acute distress. Nontoxic in appearance, well-developed, well-nourished, Caucasian male. Very pleasant. HENT: AT. [place].  MMM.  Eyes:Pupils Equal Round Reactive to light, Extraocular movements intact,  Conjunctiva without redness, discharge or icterus. CV: Mild bradycardia on exam, no edema, diminished but equal pulses BLE Chest: CTAB, no wheeze or crackles Abd: Soft. NTND. BS present.  Skin: no rashes, purpura or petechiae.  Neuro: Normal gait. PERLA. EOMi. Alert. Oriented x3   Assessment/Plan: [name] is a 83 y.o. male present for OV for  Essential hypertension/Paroxysmal atrial fibrillation (HCC)/Stage 3 chronic kidney disease/HLD -  Blood pressure today again mildly above goal for systolic reading, however his diastolic and heart rate is more desirable than the low-dose he was reporting at home. I question if his blood pressure cuff at home is accurate. - Nurse visit with BMP in 2-3 days. Patient instructed to bring in his blood pressure cuff to check for accuracy. - Creatinine increased to 2.27, patient encouraged to remain hydrated, blood pressure medications were cut back concerned for decreased  perfusion causing increasing creatinine. He will have labs retested in 2-3 days. If creatinine remains elevated, will have him see his nephrologist. - continue low salt diet and exercise.  - Continue Coreg to 6.25 mg BID--> if blood pressure cuff is found to be accurate, and he is having low heart rate as he is reporting, he needs to be seen by his cardiologist for further evaluation and/or May consider lowering Coreg again. -  He will monitor HR and BP, goal HR 60-70, BP 110-140/60-85 with him. He is very difficult to control and his hydration and IDA/epo injections cause fluctuations leading to both highs and lower than desired readings. Certainly would rather mildly elevated BP over too low.   electronically signed by:  Howard Pouch, DO  [place]

## 2017-01-22 ENCOUNTER — Ambulatory Visit (INDEPENDENT_AMBULATORY_CARE_PROVIDER_SITE_OTHER): Payer: Medicare Other | Admitting: *Deleted

## 2017-01-22 ENCOUNTER — Other Ambulatory Visit (INDEPENDENT_AMBULATORY_CARE_PROVIDER_SITE_OTHER): Payer: Medicare Other

## 2017-01-22 DIAGNOSIS — Z79899 Other long term (current) drug therapy: Secondary | ICD-10-CM | POA: Diagnosis not present

## 2017-01-22 DIAGNOSIS — I10 Essential (primary) hypertension: Secondary | ICD-10-CM

## 2017-01-22 LAB — BASIC METABOLIC PANEL
BUN: 31 mg/dL — AB (ref 6–23)
CO2: 21 mEq/L (ref 19–32)
CREATININE: 1.77 mg/dL — AB (ref 0.40–1.50)
Calcium: 8.7 mg/dL (ref 8.4–10.5)
Chloride: 114 mEq/L — ABNORMAL HIGH (ref 96–112)
GFR: 38.92 mL/min — AB (ref >60.00)
GLUCOSE: 106 mg/dL — AB (ref 70–99)
Potassium: 5.1 mEq/L (ref 3.5–5.1)
Sodium: 141 mEq/L (ref 135–145)

## 2017-01-22 MED ORDER — BLOOD PRESSURE MONITOR/M CUFF MISC: 1.0000 [IU] | Freq: Every day | 0 refills | Status: DC

## 2017-01-22 NOTE — Progress Notes (Addendum)
Patient presents today for blood pressure check . Compared our readings with patient home blood pressure machine. Current readings our machine 168/64 with pulse of 55 his machine 172/47 with pulse of 50 and manuel reading of 162/60 with pulse of 52. Patient's BP machine is old and patients cuff is incorrect size for his arm. Prescription for new BP cuff given to patient. Patient will continue to record his home readings and follow up as directed . Instructions from Dr Raoul Pitch reviewed with patient. Patient verbalized understanding .  Pt will follow up in 2 weeks with new cuff readings.  Considering decreasing coreg again and increasing amlodipine will await BMP results  collected today Script for new BP cuff/system  Medical screening examination/treatment/procedure(s) were performed by non-physician practitioner and as supervising physician I was immediately available for consultation/collaboration.    Electronically Signed by: Howard Pouch, DO [place] [facility] [place]

## 2017-01-23 ENCOUNTER — Telehealth: Payer: Self-pay | Admitting: Family Medicine

## 2017-01-23 NOTE — Telephone Encounter (Signed)
Spoke with patient reviewed lab results and instructions. Patient states he will get new cuff today and will call us next week with BP and pulse readings.

## 2017-01-23 NOTE — Telephone Encounter (Signed)
Please call [name]: - his labs are back to his baseline and look much better. - I would like to proceed with him checking his BP/HR daily with new cuff, make certain sitting and at about 2 hours after morning meds. If the HR is below 50 routinely, or BP >765 systolic routinely, he is to call in early next week and we will adjust medications.  Otherwise we will follow up at the 2 week appt, and discuss med adjustment if needed.

## 2017-01-29 ENCOUNTER — Other Ambulatory Visit: Payer: Medicare Other

## 2017-02-03 ENCOUNTER — Encounter: Payer: Self-pay | Admitting: Family Medicine

## 2017-02-03 ENCOUNTER — Ambulatory Visit (INDEPENDENT_AMBULATORY_CARE_PROVIDER_SITE_OTHER): Payer: Medicare Other | Admitting: Family Medicine

## 2017-02-03 VITALS — BP 147/55 | HR 58 | Resp 20 | Ht 68.0 in | Wt 153.5 lb

## 2017-02-03 DIAGNOSIS — K580 Irritable bowel syndrome with diarrhea: Secondary | ICD-10-CM | POA: Diagnosis not present

## 2017-02-03 DIAGNOSIS — I10 Essential (primary) hypertension: Secondary | ICD-10-CM | POA: Diagnosis not present

## 2017-02-03 DIAGNOSIS — I480 Paroxysmal atrial fibrillation: Secondary | ICD-10-CM

## 2017-02-03 MED ORDER — AMLODIPINE BESYLATE 2.5 MG PO TABS: 2.5000 mg | ORAL_TABLET | Freq: Two times a day (BID) | ORAL | 0 refills | Status: DC

## 2017-02-03 MED ORDER — DICYCLOMINE HCL 10 MG PO CAPS: 10.0000 mg | ORAL_CAPSULE | Freq: Two times a day (BID) | ORAL | 0 refills | Status: DC

## 2017-02-03 MED ORDER — CARVEDILOL 3.125 MG PO TABS: 3.1250 mg | ORAL_TABLET | Freq: Two times a day (BID) | ORAL | 0 refills | Status: DC

## 2017-02-03 NOTE — Progress Notes (Signed)
[name] , 1929/02/28, 83 y.o., male MRN: [id number] Patient Care Team    Relationship Specialty Notifications Start End  William Hillock, DO PCP - General Family Medicine  12/18/15   William Drought, MD Consulting Physician Ophthalmology  12/18/15   William Miner, MD Consulting Physician Dermatology  12/18/15   William Hector, MD Consulting Physician Cardiology  12/18/15   William Stalls, MD Consulting Physician Ophthalmology  12/19/15   William Napoleon, MD Consulting Physician Oncology  09/30/16   William Agent, MD Attending Physician Nephrology  09/30/16   William Matin, MD Consulting Physician Dermatology  09/30/16     Chief Complaint  Patient presents with  . Hypertension    Subjective:  Hypertension/CKD3/A.Fib:  Patient presents today for follow-up after blood pressure medication changes. Patient reports compliance with lisinopril 40 mg daily, Coreg at new Lower dose of 6.25 twice a day and amlodipine 2.5 mg daily. She was provided with new blood pressure cuff system after his was found to be incorrect when compared to office blood pressure cuff readings. His kidney function has returned to his baseline since lowering his Coreg, felt he was having no diastolics and low heart rate. Patient brings blood pressure readings today with average  pressures ~ 140- 150/ 50-60s and heart rate 50s.    Prior note Patient returns today with blood pressure readings ranging from 142-163/42-56 and heart rate 39-60, most heart rates in the 50s. He has stopped the clonidine as requested. He has started the lower dose Coreg at 6.25 mg twice a day. He states over the last 2-3 days he has noticed his blood pressure seemed to be regulating out better, most are in the 361W ERXVQMGQ/67Y diastolic. He still has some lower and diastolic readings and heart rate readings. He denies any chest pain, shortness of breath, lower extremity edema or dizziness.  Prior note 01/15/2017 Pt reports compliance with  lisinopril 40 mg QD, Coreg 12.5 BID, Amlodipine 2.5 mg daily and he is still taking clonidine 0.1 mg at night. Blood pressures ranges at home are labile between 109-159/38-68. Patient denies chest pain, shortness of breath or lower extremity edema. He does endorse feeling fatigued when the diastolics are lower and. Pt daily baby ASA and liquids. Pt is  prescribed statin. At home he reports heart rates as low as 38- 69 BMP: 01/08/2017 with mildly elevated creatinine at 1.9, his baseline approximately 1.75 CBC: 01/08/2017 CBC baseline for him with his iron deficiency anemia and anemia of chronic disease. Lipid: LDL collected today, patient not fasting PTH/calcium/vitamin D: August 2017, within normal limits. Collected today. Diet: Low sodium diet followed Exercise: very active RF: CKD3, HLD, CAD w/ CABG (1999), cardiac cath 2011 2/2 CP (all grafts normal), PVD, Afib    Allergies  Allergen Reactions  . Penicillins Hives   Social History  Substance Use Topics  . Smoking status: Former Smoker    Quit date: 12/25/1961  . Smokeless tobacco: Former Systems developer    Types: Chew    Quit date: 02/03/1969  . Alcohol use No   Past Medical History:  Diagnosis Date  . Basal cell carcinoma    skin  . Bigeminal rhythm   . Cataract   . Chronic renal insufficiency, stage 3 (moderate) (HCC) 2018   GFR 30s-40s  . Coronary artery disease    post bypass  . CVD (cardiovascular disease)   . Diabetes mellitus without complication ([place])   . Diverticular disease   . Easy bruising   .  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 Gout   . Hernia   . Hyperlipidemia   . Hypertension   . Iron deficiency anemia 02/04/2016  . Macular degeneration   . Microscopic colitis   . PVD (peripheral vascular disease) ([place])    Past Surgical History:  Procedure Laterality Date  . CARDIAC CATHETERIZATION  2006  . CARDIOVERSION N/A 02/22/2013   Procedure: CARDIOVERSION;  Surgeon: William Hector, MD;  Location[facility] ENDOSCOPY;   Service: Cardiovascular;  Laterality: N/A;  . CARDIOVERSION N/A 11/15/2014   Procedure: CARDIOVERSION;  Surgeon: William Hector, MD;  Location: [facility];  Service: Cardiovascular;  Laterality: N/A;  . CAROTID ENDARTERECTOMY  2009/ 1993   left/ right  . CORONARY ARTERY BYPASS GRAFT  1999  . EYE SURGERY     eyelid repair  . INGUINAL HERNIA REPAIR  02/11/2012   Procedure: LAPAROSCOPIC BILATERAL INGUINAL HERNIA REPAIR;  Surgeon: Pedro Earls, MD;  Location: [facility] ORS;  Service: General;  Laterality: Bilateral;  . SKIN CANCER EXCISION     right ear x 3   Family History  Problem Relation Age of Onset  . Stroke Mother   . Coronary artery disease Father   . Heart disease Father   . Melanoma Sister    Allergies as of 02/03/2017      Reactions   Penicillins Hives      Medication List       Accurate as of 02/03/17  9:09 AM. Always use your most recent med list.          allopurinol 300 MG tablet Commonly known as:  ZYLOPRIM Take 0.5 tablets (150 mg total) by mouth daily.   Aloe Vera 25 MG Caps Take 2 capsules by mouth daily.   amLODipine 2.5 MG tablet Commonly known as:  NORVASC Take 1 tablet (2.5 mg total) by mouth daily.   aspirin 81 MG tablet Take 81 mg by mouth at bedtime.   atorvastatin 20 MG tablet Commonly known as:  LIPITOR Take 1 tablet (20 mg total) by mouth at bedtime.   Blood Pressure Monitor/M Cuff Misc 1 Units by Does not apply route daily.   carvedilol 6.25 MG tablet Commonly known as:  COREG Take 1 tablet (6.25 mg total) by mouth 2 (two) times daily with a meal.   cholecalciferol 1000 units tablet Commonly known as:  VITAMIN D Take 5,000 Units by mouth daily.   CHROMIUM GTF PO Take 2 capsules by mouth daily.   Cinnamon 500 MG Tabs Take 2 tablets by mouth at bedtime.   Co Q 10 100 MG Caps Take 1 capsule by mouth daily.   DARBEPOETIN ALFA-ALBUMIN IJ Inject as directed.   dicyclomine 10 MG capsule Commonly known as:  BENTYL Take 1  capsule (10 mg total) by mouth 4 (four) times daily -  before meals and at bedtime.   ELIQUIS 2.5 MG Tabs tablet Generic drug:  apixaban TAKE 1 TABLET TWICE A DAY   fish oil-omega-3 fatty acids 1000 MG capsule Take 1 g by mouth daily.   glucose blood test strip Commonly known as:  PRODIGY NO CODING BLOOD GLUC Use as instructed once daily   lisinopril 40 MG tablet Commonly known as:  PRINIVIL,ZESTRIL Take 1 tablet (40 mg total) by mouth daily.   nitroGLYCERIN 0.4 MG SL tablet Commonly known as:  NITROSTAT Place 0.4 mg under the tongue every 5 (five) minutes as needed (up to 3 doses only). For chest pain   ONETOUCH DELICA LANCETS 90W Misc  CHECK FASTING GLUCOSE ONCE DAILY   PRESERVISION AREDS PO Take 1 tablet by mouth 2 (two) times daily.   PROBIOTIC PO Take 1 tablet by mouth daily.   tamsulosin 0.4 MG Caps capsule Commonly known as:  FLOMAX Take 1 capsule (0.4 mg total) by mouth daily after supper.   VANADYL SULFATE PO Take 1 capsule by mouth daily.   zinc gluconate 50 MG tablet Take 50 mg by mouth daily.       No results found for this or any previous visit (from the past 24 hour(s)). No results found.   ROS: Negative, with the exception of above mentioned in HPI   Objective:  BP (!) 147/55 (BP Location: Right Arm, Patient Position: Sitting, Cuff Size: Normal)   Pulse (!) 58   Resp 20   Ht 5\' 8"  (1.727 m)   Wt 153 lb 8 oz (69.6 kg)   SpO2 97%   BMI 23.34 kg/m  Body mass index is 23.34 kg/m. Gen: Afebrile. No acute distress. Nontoxic in appearance, well-developed, well-nourished very pleasant Caucasian male. HENT: AT. [place].  MMM.  CV: RRR no murmur, no edema Chest: CTAB, no wheeze or crackles Neuro:  Normal gait. PERLA. EOMi. Alert. Oriented.   Assessment/Plan: BERKELEY VANAKEN is a 83 y.o. male present for OV for  Essential hypertension/Paroxysmal atrial fibrillation (HCC)/Stage 3 chronic kidney disease/HLD -  Blood pressures are improving. Kidney  function has returned to baseline. - Heart rate still lower than desired, will decrease Coreg to 3.125 twice a day and patient will continue to monitor. Need to keep adequate control of his A. Fib, but attempt to keep him from being bradycardic. - Continue lisinopril 40 mg daily - Increase amlodipine to 2.5 mg twice a day dosing. Patient seems to be concerned about nighttime blood pressures and not having coverage. Discussed amlodipine is typically a 24-hour medication and he could use a small increase to 5 mg daily, however we can prescribe 2.5 mg twice a day. -  He will monitor HR and BP, goal HR 60-70, BP 110-140/60-85 with him. He is very difficult to control and his hydration and IDA/epo injections cause fluctuations leading to both highs and lower than desired readings. Certainly would rather mildly elevated BP over too low.  - Follow-up 3 months sooner if not at goal.  electronically signed by:  Howard Pouch, DO  [place]

## 2017-02-03 NOTE — Patient Instructions (Signed)
New coreg dose of 3.125mg  twice a day.  Amlodipine is 2.5 mg twice a day.  Keep lisinopril the same.    I suspect you will see a mild increase in heart rate.  BP should decrease to normal range.   Follow up in 3 months.

## 2017-02-17 DIAGNOSIS — H43813 Vitreous degeneration, bilateral: Secondary | ICD-10-CM | POA: Diagnosis not present

## 2017-02-17 DIAGNOSIS — H35433 Paving stone degeneration of retina, bilateral: Secondary | ICD-10-CM | POA: Diagnosis not present

## 2017-02-17 DIAGNOSIS — H35423 Microcystoid degeneration of retina, bilateral: Secondary | ICD-10-CM | POA: Diagnosis not present

## 2017-02-17 DIAGNOSIS — H353134 Nonexudative age-related macular degeneration, bilateral, advanced atrophic with subfoveal involvement: Secondary | ICD-10-CM | POA: Diagnosis not present

## 2017-03-16 ENCOUNTER — Telehealth: Payer: Self-pay | Admitting: Family Medicine

## 2017-03-16 NOTE — Telephone Encounter (Signed)
° °  Patient Name: [name]  Gender: Male  DOB: 11/11/1928   Age: 83 Y 44 M 16 D  Return Phone Number: [phone] (Primary)  Address:   City/State/Zip: [place] [address]   Client [facility] - Client  Client Site [place] - Day  Physician Raoul Pitch, [place]   Contact Type Call  Who Is Calling Patient / Member / Family / Caregiver  Call Type Triage / Clinical  Relationship To Patient Self  Return Phone Number [phone] (Primary)  Chief Complaint Leg Swelling And Edema  Reason for Call Symptomatic / Request for Health Information  Initial Comment Caller states shortness breath when attempting to do something, and swelling in legs.  Appointment Disposition EMR Appointment Not Necessary  Info pasted into Epic Yes  Translation No   Nurse Assessment  Nurse: Hassell Done, RN, Melanie Date/Time (Eastern Time): 03/16/2017 4:05:12 PM  Confirm and document reason for call. If symptomatic, describe symptoms. ---Caller states both legs are swollen and he has been experiencing SOB when he tries to do things. Caller states he was moving something heavy into the house and became SOB. This has happened before. States his legs were swollen in the past, but it is worse now. Caller takes amlodipine, ASA, lipitor, carvedilol, lisinopril, allopurinol. B/P today was 151/69, 160/66,  Does the patient have any new or worsening symptoms? ---Yes  Will a triage be completed? ---Yes  Related visit to physician within the last 2 weeks? ---Yes  Does the PT have any chronic conditions? (i.e. diabetes, asthma, etc.) ---Yes  List chronic conditions. ---diabetes, hypertension,  Is this a behavioral health or substance abuse call? ---No     Guidelines      Guideline Title Affirmed Question Affirmed Notes Nurse Date/Time Eilene Ghazi Time)  Breathing Difficulty Extra heart beats OR irregular heart beating (i.e., "palpitations")  Hassell Done, RN, Threasa Beards 03/16/2017 4:16:33 PM    Disp. Time Eilene Ghazi Time) Disposition Final User   03/16/2017 4:38:43 PM Send To RN Personal  Hassell Done, RN, Melanie    03/16/2017 4:32:51 PM Go to ED Now Yes Hassell Done, RN, Donnajean Lopes Disagree/Comply Comply  Caller Understands Yes  PreDisposition Call Doctor   Care Advice Given Per Guideline      GO TO ED NOW: You need to be seen in the Emergency Department. Go to the ER at ___________ [place] now. Drive carefully. CALL EMS 911 IF: you become worse. BRING MEDICINES: * Please bring a list of your current medicines when you go to the Emergency Department (ER). * It is also a good idea to bring the pill bottles too. This will help the doctor to make certain you are taking the right medicines and the right dose. CARE ADVICE given per Breathing Difficulty (Adult) guideline.   Referrals  [facility] - ED

## 2017-03-16 NOTE — Telephone Encounter (Signed)
Noted  

## 2017-03-17 ENCOUNTER — Emergency Department (HOSPITAL_BASED_OUTPATIENT_CLINIC_OR_DEPARTMENT_OTHER): Payer: Medicare Other

## 2017-03-17 ENCOUNTER — Encounter (HOSPITAL_BASED_OUTPATIENT_CLINIC_OR_DEPARTMENT_OTHER): Payer: Self-pay | Admitting: Emergency Medicine

## 2017-03-17 ENCOUNTER — Other Ambulatory Visit: Payer: Self-pay | Admitting: *Deleted

## 2017-03-17 ENCOUNTER — Emergency Department (HOSPITAL_BASED_OUTPATIENT_CLINIC_OR_DEPARTMENT_OTHER)
Admission: EM | Admit: 2017-03-17 | Discharge: 2017-03-17 | Disposition: A | Discharge status: Home / Self Care | Payer: Medicare Other | Attending: Emergency Medicine | Admitting: Emergency Medicine

## 2017-03-17 DIAGNOSIS — I2510 Atherosclerotic heart disease of native coronary artery without angina pectoris: Secondary | ICD-10-CM | POA: Insufficient documentation

## 2017-03-17 DIAGNOSIS — E1122 Type 2 diabetes mellitus with diabetic chronic kidney disease: Secondary | ICD-10-CM | POA: Diagnosis not present

## 2017-03-17 DIAGNOSIS — N189 Chronic kidney disease, unspecified: Secondary | ICD-10-CM | POA: Diagnosis not present

## 2017-03-17 DIAGNOSIS — Z85828 Personal history of other malignant neoplasm of skin: Secondary | ICD-10-CM | POA: Insufficient documentation

## 2017-03-17 DIAGNOSIS — Z951 Presence of aortocoronary bypass graft: Secondary | ICD-10-CM | POA: Insufficient documentation

## 2017-03-17 DIAGNOSIS — Z7982 Long term (current) use of aspirin: Secondary | ICD-10-CM | POA: Insufficient documentation

## 2017-03-17 DIAGNOSIS — I129 Hypertensive chronic kidney disease with stage 1 through stage 4 chronic kidney disease, or unspecified chronic kidney disease: Secondary | ICD-10-CM | POA: Insufficient documentation

## 2017-03-17 DIAGNOSIS — R609 Edema, unspecified: Secondary | ICD-10-CM | POA: Insufficient documentation

## 2017-03-17 DIAGNOSIS — R0602 Shortness of breath: Secondary | ICD-10-CM | POA: Diagnosis not present

## 2017-03-17 DIAGNOSIS — R0609 Other forms of dyspnea: Secondary | ICD-10-CM

## 2017-03-17 DIAGNOSIS — Z87891 Personal history of nicotine dependence: Secondary | ICD-10-CM | POA: Insufficient documentation

## 2017-03-17 DIAGNOSIS — Z79899 Other long term (current) drug therapy: Secondary | ICD-10-CM | POA: Insufficient documentation

## 2017-03-17 DIAGNOSIS — R600 Localized edema: Secondary | ICD-10-CM | POA: Diagnosis not present

## 2017-03-17 DIAGNOSIS — Z7901 Long term (current) use of anticoagulants: Secondary | ICD-10-CM | POA: Insufficient documentation

## 2017-03-17 HISTORY — DX: Unspecified atrial fibrillation: I48.91

## 2017-03-17 LAB — BASIC METABOLIC PANEL
ANION GAP: 6 (ref 5–15)
BUN: 37 mg/dL — ABNORMAL HIGH (ref 6–20)
CHLORIDE: 115 mmol/L — AB (ref 101–111)
CO2: 19 mmol/L — AB (ref 22–32)
Calcium: 8.5 mg/dL — ABNORMAL LOW (ref 8.9–10.3)
Creatinine, Ser: 1.52 mg/dL — ABNORMAL HIGH (ref 0.61–1.24)
GFR calc non Af Amer: 40 mL/min — ABNORMAL LOW (ref >60)
GFR, EST AFRICAN AMERICAN: 46 mL/min — AB (ref >60)
Glucose, Bld: 139 mg/dL — ABNORMAL HIGH (ref 65–99)
Potassium: 4.5 mmol/L (ref 3.5–5.1)
Sodium: 140 mmol/L (ref 135–145)

## 2017-03-17 LAB — CBC
HCT: 29.9 % — ABNORMAL LOW (ref 39.0–52.0)
HEMOGLOBIN: 9.7 g/dL — AB (ref 13.0–17.0)
MCH: 30.1 pg (ref 26.0–34.0)
MCHC: 32.4 g/dL (ref 30.0–36.0)
MCV: 92.9 fL (ref 78.0–100.0)
Platelets: 135 10*3/uL — ABNORMAL LOW (ref 150–400)
RBC: 3.22 MIL/uL — AB (ref 4.22–5.81)
RDW: 14.6 % (ref 11.5–15.5)
WBC: 7 10*3/uL (ref 4.0–10.5)

## 2017-03-17 LAB — BRAIN NATRIURETIC PEPTIDE: B Natriuretic Peptide: 879.1 pg/mL — ABNORMAL HIGH (ref 0.0–100.0)

## 2017-03-17 LAB — HEPATIC FUNCTION PANEL
ALBUMIN: 3.5 g/dL (ref 3.5–5.0)
ALK PHOS: 78 U/L (ref 38–126)
ALT: 26 U/L (ref 17–63)
AST: 25 U/L (ref 15–41)
BILIRUBIN TOTAL: 0.5 mg/dL (ref 0.3–1.2)
Bilirubin, Direct: 0.2 mg/dL (ref 0.1–0.5)
Indirect Bilirubin: 0.3 mg/dL (ref 0.3–0.9)
Total Protein: 6.6 g/dL (ref 6.5–8.1)

## 2017-03-17 LAB — TROPONIN I
TROPONIN I: 0.03 ng/mL — AB (ref <0.03)
Troponin I: 0.03 ng/mL (ref <0.03)

## 2017-03-17 MED ORDER — FUROSEMIDE 10 MG/ML IJ SOLN
40.0000 mg | Freq: Once | INTRAMUSCULAR | Status: AC
  Administered 2017-03-17: 40 mg via INTRAVENOUS
  Filled 2017-03-17: qty 4

## 2017-03-17 MED ORDER — POTASSIUM CHLORIDE ER 10 MEQ PO TBCR: 10.0000 meq | EXTENDED_RELEASE_TABLET | Freq: Every day | ORAL | 0 refills | Status: DC

## 2017-03-17 MED ORDER — AMLODIPINE BESYLATE 2.5 MG PO TABS: 2.5000 mg | ORAL_TABLET | Freq: Two times a day (BID) | ORAL | 0 refills | Status: DC

## 2017-03-17 MED ORDER — FUROSEMIDE 20 MG PO TABS: 20.0000 mg | ORAL_TABLET | Freq: Every day | ORAL | 0 refills | Status: DC

## 2017-03-17 NOTE — ED Notes (Signed)
Pt ambulated in hallway, maintaining O2 sat 94-95%; no dyspnea noted.

## 2017-03-17 NOTE — ED Provider Notes (Signed)
[facility] EMERGENCY DEPARTMENT Provider Note   CSN: [id number] Arrival date & time: 03/17/17  2951     History   Chief Complaint Chief Complaint  Patient presents with  . Shortness of Breath  . Leg Swelling    HPI [name] is a 83 y.o. male.  HPI Patient presents with shortness of breath for the last week.  States dyspnea is triggered by exertion.  Has had mild cough.  No sputum production.  No fever or chills.  Patient also states he has had bilateral lower extremity swelling.  Denies chest pain.  History of chronic atrial fibrillation and is on Eliquis. Past Medical History:  Diagnosis Date  . A-fib ([place])   . Basal cell carcinoma    skin  . Bigeminal rhythm   . Cataract   . Chronic renal insufficiency, stage 3 (moderate) (HCC) 2018   GFR 30s-40s  . Coronary artery disease    post bypass  . CVD (cardiovascular disease)   . Diabetes mellitus without complication ([place])   . Diverticular disease   . Easy bruising   . 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 Gout   . Hernia   . Hyperlipidemia   . Hypertension   . Iron deficiency anemia 02/04/2016  . Macular degeneration   . Microscopic colitis   . PVD (peripheral vascular disease) [name])     Patient Active Problem List   Diagnosis Date Noted  . Iron deficiency anemia 02/04/2016  . 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 Vitamin D deficiency 12/27/2015  . Anemia associated with stage 3 chronic renal failure ([place]) 12/27/2015  . Bence Jones proteinuria 12/27/2015  . Chronic kidney disease (CKD) 12/19/2015  . Macular degeneration   . Gout   . A-fib ([place]) 01/03/2013  . ATHEROSCLEROSIS W /INT CLAUDICATION 08/03/2008  . HYPERCHOLESTEROLEMIA  IIA 08/02/2008  . Essential hypertension 08/02/2008  . CAD, ARTERY BYPASS GRAFT 08/02/2008  . BIGEMINY 08/02/2008  . CAROTID ARTERY DISEASE 08/02/2008  . PVD 08/02/2008  . Prediabetes 08/02/2008    Past Surgical History:  Procedure  Laterality Date  . CARDIAC CATHETERIZATION  2006  . CARDIOVERSION N/A 02/22/2013   Procedure: CARDIOVERSION;  Surgeon: [name], MD;  Location: [facility] ENDOSCOPY;  Service: Cardiovascular;  Laterality: N/A;  . CARDIOVERSION N/A 11/15/2014   Procedure: CARDIOVERSION;  Surgeon: [name], MD;  Location: [facility];  Service: Cardiovascular;  Laterality: N/A;  . CAROTID ENDARTERECTOMY  2009/ 1993   left/ right  . CORONARY ARTERY BYPASS GRAFT  1999  . EYE SURGERY     eyelid repair  . INGUINAL HERNIA REPAIR  02/11/2012   Procedure: LAPAROSCOPIC BILATERAL INGUINAL HERNIA REPAIR;  Surgeon: Pedro Earls, MD;  Location: [facility] ORS;  Service: General;  Laterality: Bilateral;  . SKIN CANCER EXCISION     right ear x 3       Home Medications    Prior to Admission medications   Medication Sig Start Date End Date Taking? Authorizing Provider  allopurinol (ZYLOPRIM) 300 MG tablet Take 0.5 tablets (150 mg total) by mouth daily. 04/11/16   Kuneff, Renee A, DO  Aloe Vera 25 MG CAPS Take 2 capsules by mouth daily.    [provider]  amLODipine (NORVASC) 2.5 MG tablet Take 1 tablet (2.5 mg total) by mouth 2 (two) times daily. 02/03/17   Kuneff, Renee A, DO  aspirin 81 MG tablet Take 81 mg by mouth at bedtime.     [provider]  atorvastatin (LIPITOR)  20 MG tablet Take 1 tablet (20 mg total) by mouth at bedtime. 01/15/17   Kuneff, Renee A, DO  Blood Pressure Monitoring (BLOOD PRESSURE MONITOR/M CUFF) MISC 1 Units by Does not apply route daily. 01/22/17   Kuneff, Renee A, DO  carvedilol (COREG) 3.125 MG tablet Take 1 tablet (3.125 mg total) by mouth 2 (two) times daily with a meal. 02/03/17   Kuneff, Renee A, DO  cholecalciferol (VITAMIN D) 1000 units tablet Take 5,000 Units by mouth daily.    [provider]  CHROMIUM GTF PO Take 2 capsules by mouth daily.    [provider]  Cinnamon 500 MG TABS Take 2 tablets by mouth at bedtime.     [provider]    Coenzyme Q10 (CO Q 10) 100 MG CAPS Take 1 capsule by mouth daily.     [provider]  DARBEPOETIN ALFA-ALBUMIN IJ Inject as directed.    [provider]  dicyclomine (BENTYL) 10 MG capsule Take 1 capsule (10 mg total) by mouth 2 (two) times daily. 02/03/17   Kuneff, Renee A, DO  ELIQUIS 2.5 MG TABS tablet TAKE 1 TABLET TWICE A DAY 08/12/16   [name], MD  fish oil-omega-3 fatty acids 1000 MG capsule Take 1 g by mouth daily.     [provider]  furosemide (LASIX) 20 MG tablet Take 1 tablet (20 mg total) by mouth daily. 03/17/17   Julianne [facility], MD  glucose blood (PRODIGY NO CODING BLOOD GLUC) test strip Use as instructed once daily 07/03/16   Kuneff, Renee A, DO  lisinopril (PRINIVIL,ZESTRIL) 40 MG tablet Take 1 tablet (40 mg total) by mouth daily. 01/15/17   Kuneff, Renee A, DO  Multiple Vitamins-Minerals (PRESERVISION AREDS PO) Take 1 tablet by mouth 2 (two) times daily.     [provider]  nitroGLYCERIN (NITROSTAT) 0.4 MG SL tablet Place 0.4 mg under the tongue every 5 (five) minutes as needed (up to 3 doses only). For chest pain    [provider]  [facility] DELICA LANCETS 69G MISC CHECK FASTING GLUCOSE ONCE DAILY 12/15/16   Kuneff, Renee A, DO  potassium chloride (K-DUR) 10 MEQ tablet Take 1 tablet (10 mEq total) by mouth daily. 03/17/17   Julianne [facility], MD  Probiotic Product (PROBIOTIC PO) Take 1 tablet by mouth daily.    [provider]  tamsulosin (FLOMAX) 0.4 MG CAPS capsule Take 1 capsule (0.4 mg total) by mouth daily after supper. 08/18/16   Kuneff, Renee A, DO  VANADYL SULFATE PO Take 1 capsule by mouth daily.    [provider]  zinc gluconate 50 MG tablet Take 50 mg by mouth daily.     [provider]    Family History Family History  Problem Relation Age of Onset  . Stroke Mother   . Coronary artery disease Father   . Heart disease Father   . Melanoma Sister     Social History Social History    Tobacco Use  . Smoking status: Former Smoker    Last attempt to quit: 12/25/1961    Years since quitting: 55.2  . Smokeless tobacco: Former Systems developer    Types: Chew    Quit date: 02/03/1969  Substance Use Topics  . Alcohol use: No    Alcohol/week: 0.0 oz  . Drug use: No     Allergies   Penicillins   Review of Systems Review of Systems  Constitutional: Positive for fatigue. Negative for chills and fever.  Respiratory: Positive  for cough and shortness of breath. Negative for wheezing.   Cardiovascular: Positive for leg swelling. Negative for chest pain and palpitations.  Gastrointestinal: Negative for abdominal pain, diarrhea, nausea and vomiting.  Genitourinary: Negative for dysuria and frequency.  Musculoskeletal: Negative for back pain, myalgias, neck pain and neck stiffness.  Skin: Negative for rash and wound.  Neurological: Negative for dizziness, weakness, light-headedness, numbness and headaches.  All other systems reviewed and are negative.    Physical Exam Updated Vital Signs BP (!) 164/55   Pulse (!) 50   Temp 98.2 F (36.8 C) (Oral)   Resp 17   Ht 5\' 7"  (1.702 m)   Wt 70.8 kg (156 lb)   SpO2 91%   BMI 24.43 kg/m   Physical Exam  Constitutional: He is oriented to person, place, and time. He appears well-developed and well-nourished.  HENT:  Head: Normocephalic and atraumatic.  Mouth/Throat: Oropharynx is clear and moist.  Eyes: EOM are normal. Pupils are equal, round, and reactive to light.  Neck: Normal range of motion. Neck supple.  Cardiovascular: Normal rate and regular rhythm.  Pulmonary/Chest: Effort normal and breath sounds normal. He exhibits edema.  Patient has crackles in bilateral bases.  Mild increased work of breathing  Abdominal: Soft. Bowel sounds are normal. There is no tenderness. There is no rebound and no guarding.  Musculoskeletal: Normal range of motion. He exhibits no edema or tenderness.  2+ bilateral lower extremity edema. distal  pulses intact.  Neurological: He is alert and oriented to person, place, and time.  Skin: Skin is warm and dry. Capillary refill takes less than 2 seconds. No rash noted. No erythema.  Psychiatric: He has a normal mood and affect. His behavior is normal.  Nursing note and vitals reviewed.    ED Treatments / Results  Labs (all labs ordered are listed, but only abnormal results are displayed) Labs Reviewed  BASIC METABOLIC PANEL - Abnormal; Notable for the following components:      Result Value   Chloride 115 (*)    CO2 19 (*)    Glucose, Bld 139 (*)    BUN 37 (*)    Creatinine, Ser 1.52 (*)    Calcium 8.5 (*)    GFR calc non Af Amer 40 (*)    GFR calc Af Amer 46 (*)    All other components within normal limits  CBC - Abnormal; Notable for the following components:   RBC 3.22 (*)    Hemoglobin 9.7 (*)    HCT 29.9 (*)    Platelets 135 (*)    All other components within normal limits  TROPONIN I - Abnormal; Notable for the following components:   Troponin I 0.03 (*)    All other components within normal limits  BRAIN NATRIURETIC PEPTIDE - Abnormal; Notable for the following components:   B Natriuretic Peptide 879.1 (*)    All other components within normal limits  TROPONIN I - Abnormal; Notable for the following components:   Troponin I 0.03 (*)    All other components within normal limits  HEPATIC FUNCTION PANEL    EKG  EKG Interpretation  Date/Time:  Tuesday March 17 2017 09:06:33 EST Ventricular Rate:  65 PR Interval:    QRS Duration: 95 QT Interval:  414 QTC Calculation: 431 R Axis:   109 Text Interpretation:  Atrial flutter with predominant 3:1 AV block Abnormal lateral Q waves Anterior infarct, old Borderline repolarization abnormality Confirmed by Julianne [facility] [phone]) on 03/17/2017 12:45:15 PM  Radiology Dg Chest 2 View  Result Date: 03/17/2017 CLINICAL DATA:  Shortness of breath and lower extremity edema EXAM: CHEST  2 VIEW COMPARISON:   December 05, 2016 FINDINGS: There is a small left pleural effusion. There is mild atelectasis in the lateral right base. There is no edema or consolidation. Heart is borderline prominent with pulmonary vascularity within normal limits. Patient is status post coronary artery bypass grafting. There is aortic atherosclerosis. No bone lesions. There is a stable surgical clip of the right cervical-thoracic junction. IMPRESSION: Small left pleural effusion. Mild lateral right base atelectasis. No edema or consolidation. Heart borderline prominent with pulmonary vascular within normal limits. Postoperative changes noted. There is aortic atherosclerosis. No evident adenopathy. Aortic Atherosclerosis (ICD10-I70.0). Electronically Signed   By: Lowella Grip III M.D.   On: 03/17/2017 09:56    Procedures Procedures (including critical care time)  Medications Ordered in ED Medications  furosemide (LASIX) injection 40 mg (40 mg Intravenous Given 03/17/17 1012)     Initial Impression / Assessment and Plan / ED Course  I have reviewed the triage vital signs and the nursing notes.  Pertinent labs & imaging results that were available during my care of the patient were reviewed by me and considered in my medical decision making (see chart for details).     She diuresed greater than 400 cc.  States he is feeling better.  Ambulatory to the bathroom without difficulty.  Maintaining oxygen saturations in the 90's.  Repeat troponin is unchanged.  Mild elevation likely due to underlying renal insufficiency.  Patient continues to deny any chest pain.  Will discharge with low-dose Lasix and advised close follow-up with his primary physician and cardiologist.  Final Clinical Impressions(s) / ED Diagnoses   Final diagnoses:  Dyspnea on exertion  Peripheral edema    ED Discharge Orders        Ordered    furosemide (LASIX) 20 MG tablet  Daily     03/17/17 1248    potassium chloride (K-DUR) 10 MEQ tablet  Daily      03/17/17 1248       Julianne [facility], MD 03/17/17 1248

## 2017-03-17 NOTE — ED Triage Notes (Signed)
Pt having sob with leg swelling for about one week.  Pt states worsening over time.  Pt lungs sounds diminished per RT.  Noted 2+ edema in lower extremities.  Pt states sob worsens with exertion.  Non productive cough.  Pt states he has gained 8 pounds in a week.

## 2017-03-21 ENCOUNTER — Other Ambulatory Visit: Payer: Self-pay | Admitting: Family Medicine

## 2017-03-23 ENCOUNTER — Telehealth: Payer: Self-pay

## 2017-03-23 NOTE — Telephone Encounter (Addendum)
Patient would like all future Rx's sent to [facility] changed pharmacy information.

## 2017-03-23 NOTE — Telephone Encounter (Signed)
[facility] Night - Client Nonclinical Telephone Record [facility] Night - Client Client Site [place] Night Physician Raoul Pitch, [place] Type Call Who Is Calling Patient / Member / Family / Caregiver Caller Name [place] Phone Number [phone] Reason for Call Symptomatic / Request for [name] states he has prescription that ran out, needing a refill, sent to [facility], and needing this switched to [facility]. Medication: Tamsulosin 4 mg. Call Closed By: Rosana Fret Transaction Date/Time: 03/20/2017 1:26:51 PM (ET)

## 2017-03-24 ENCOUNTER — Encounter: Payer: Self-pay | Admitting: *Deleted

## 2017-03-24 ENCOUNTER — Ambulatory Visit (INDEPENDENT_AMBULATORY_CARE_PROVIDER_SITE_OTHER): Payer: Medicare Other | Admitting: Physician Assistant

## 2017-03-24 ENCOUNTER — Encounter: Payer: Self-pay | Admitting: Physician Assistant

## 2017-03-24 VITALS — BP 162/48 | HR 48 | Resp 16 | Ht 67.0 in | Wt 162.1 lb

## 2017-03-24 DIAGNOSIS — N183 Chronic kidney disease, stage 3 unspecified: Secondary | ICD-10-CM

## 2017-03-24 DIAGNOSIS — I10 Essential (primary) hypertension: Secondary | ICD-10-CM

## 2017-03-24 DIAGNOSIS — I5032 Chronic diastolic (congestive) heart failure: Secondary | ICD-10-CM | POA: Diagnosis not present

## 2017-03-24 DIAGNOSIS — I209 Angina pectoris, unspecified: Secondary | ICD-10-CM

## 2017-03-24 DIAGNOSIS — I2510 Atherosclerotic heart disease of native coronary artery without angina pectoris: Secondary | ICD-10-CM

## 2017-03-24 DIAGNOSIS — I25708 Atherosclerosis of coronary artery bypass graft(s), unspecified, with other forms of angina pectoris: Secondary | ICD-10-CM | POA: Diagnosis not present

## 2017-03-24 DIAGNOSIS — I480 Paroxysmal atrial fibrillation: Secondary | ICD-10-CM | POA: Diagnosis not present

## 2017-03-24 DIAGNOSIS — I2583 Coronary atherosclerosis due to lipid rich plaque: Secondary | ICD-10-CM

## 2017-03-24 DIAGNOSIS — I509 Heart failure, unspecified: Secondary | ICD-10-CM | POA: Diagnosis not present

## 2017-03-24 LAB — BASIC METABOLIC PANEL
BUN / CREAT RATIO: 24 (ref 10–24)
BUN: 40 mg/dL — AB (ref 8–27)
CO2: 17 mmol/L — ABNORMAL LOW (ref 20–29)
CREATININE: 1.69 mg/dL — AB (ref 0.76–1.27)
Calcium: 8.8 mg/dL (ref 8.6–10.2)
Chloride: 114 mmol/L — ABNORMAL HIGH (ref 96–106)
GFR, EST AFRICAN AMERICAN: 42 mL/min/{1.73_m2} — AB (ref >59)
GFR, EST NON AFRICAN AMERICAN: 36 mL/min/{1.73_m2} — AB (ref >59)
GLUCOSE: 103 mg/dL — AB (ref 65–99)
Potassium: 5.7 mmol/L — ABNORMAL HIGH (ref 3.5–5.2)
SODIUM: 143 mmol/L (ref 134–144)

## 2017-03-24 LAB — PRO B NATRIURETIC PEPTIDE: NT-Pro BNP: 4003 pg/mL — ABNORMAL HIGH (ref 0–486)

## 2017-03-24 MED ORDER — FUROSEMIDE 20 MG PO TABS: 20.0000 mg | ORAL_TABLET | ORAL | 3 refills | Status: DC

## 2017-03-24 MED ORDER — POTASSIUM CHLORIDE ER 10 MEQ PO TBCR: 10.0000 meq | EXTENDED_RELEASE_TABLET | ORAL | 3 refills | Status: DC

## 2017-03-24 NOTE — H&P (View-Only) (Signed)
Cardiology Office Note    Date:  03/24/2017   ID:  [name], DOB Jun 09, 1928, MRN [id number]  PCP:  [name], DO  Cardiologist: Dr. Johnsie Cancel  Chief Complaint  Patient presents with  . Hospitalization Follow-up    SOB, Edema     History of Present Illness:  [name] is a 83 y.o. male with a history of  CAD with prior CABG in 1999 -LIMA to the LAD, SVG to IM, OM1, OM 2, SVG to D1, SVG to PDA/PLA, EF 45-50% with mild diffuse hypokinesis.  Had SSCP in 2011 and required cath. All of his grafts were patent.  Nuclear stress test 08/2015 scar, no ischemia LVEF 70% also has atrial fibrillation/flutter in 2014 on Eliquis status post [facility] 10/2014, PVD, claudication, von Willebrand followed by hematology.  Last saw Dr. Johnsie Cancel 10/06/16 and had presyncope was not orthostatic in the office.  Recommended avoiding diuretics for BP control.  Patient was in the ER 03/17/17 with dyspnea and leg edema.  He was given IV Lasix 40 mg and diuresed 400 cc in ER and was feeling better was sent home on Lasix 20 mg daily for 5 days.  BNP was 879.  Troponin 0 0.03 creatinine 1.52, hemoglobin 9.7.  Patient comes in today accompanied by his wife.  He still has dyspnea on exertion with little activity.  He also is having chest tightness with little activity.  Reviewing his EKG from the emergency room shows he is in atrial flutter again, slow ventricular rates.  Last cardioversion was in 2016.     Past Medical History:  Diagnosis Date  . A-fib ([place])   . Basal cell carcinoma    skin  . Bigeminal rhythm   . Cataract   . Chronic renal insufficiency, stage 3 (moderate) (HCC) 2018   GFR 30s-40s  . Coronary artery disease    post bypass  . CVD (cardiovascular disease)   . Diabetes mellitus without complication ([place])   . Diverticular disease   . Easy bruising   . 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 Gout   . Hernia   . Hyperlipidemia   . Hypertension   . Iron deficiency anemia  02/04/2016  . Macular degeneration   . Microscopic colitis   . PVD (peripheral vascular disease) ([place])     Past Surgical History:  Procedure Laterality Date  . CARDIAC CATHETERIZATION  2006  . CARDIOVERSION N/A 02/22/2013   Procedure: CARDIOVERSION;  Surgeon: [name], MD;  [facility] ENDOSCOPY;  Service: Cardiovascular;  Laterality: N/A;  . CARDIOVERSION N/A 11/15/2014   Procedure: CARDIOVERSION;  Surgeon: [name], MD;  Location: [facility];  Service: Cardiovascular;  Laterality: N/A;  . CAROTID ENDARTERECTOMY  2009/ 1993   left/ right  . CORONARY ARTERY BYPASS GRAFT  1999  . EYE SURGERY     eyelid repair  . INGUINAL HERNIA REPAIR  02/11/2012   Procedure: LAPAROSCOPIC BILATERAL INGUINAL HERNIA REPAIR;  Surgeon: Pedro Earls, MD;  Location: [facility] ORS;  Service: General;  Laterality: Bilateral;  . SKIN CANCER EXCISION     right ear x 3    Current Medications: Current Meds  Medication Sig  . allopurinol (ZYLOPRIM) 300 MG tablet Take 0.5 tablets (150 mg total) by mouth daily.  . Aloe Vera 25 MG CAPS Take 2 capsules by mouth daily.  Marland Kitchen amLODipine (NORVASC) 2.5 MG tablet Take 1 tablet (2.5 mg total) by mouth 2 (two) times daily.  Marland Kitchen aspirin 81 MG tablet  Take 81 mg by mouth at bedtime.   Marland Kitchen atorvastatin (LIPITOR) 20 MG tablet Take 1 tablet (20 mg total) by mouth at bedtime.  . Blood Pressure Monitoring (BLOOD PRESSURE MONITOR/M CUFF) MISC 1 Units by Does not apply route daily.  . carvedilol (COREG) 3.125 MG tablet Take 1 tablet (3.125 mg total) by mouth 2 (two) times daily with a meal.  . cholecalciferol (VITAMIN D) 1000 units tablet Take 5,000 Units by mouth daily.  . CHROMIUM GTF PO Take 2 capsules by mouth daily.  . Cinnamon 500 MG TABS Take 2 tablets by mouth at bedtime.   . Coenzyme Q10 (CO Q 10) 100 MG CAPS Take 1 capsule by mouth daily.   Marland Kitchen DARBEPOETIN ALFA-ALBUMIN IJ Inject as directed.  . dicyclomine (BENTYL) 10 MG capsule Take 1 capsule (10 mg total) by mouth 2  (two) times daily.  Marland Kitchen ELIQUIS 2.5 MG TABS tablet TAKE 1 TABLET TWICE A DAY  . fish oil-omega-3 fatty acids 1000 MG capsule Take 1 g by mouth daily.   Marland Kitchen glucose blood (PRODIGY NO CODING BLOOD GLUC) test strip Use as instructed once daily  . lisinopril (PRINIVIL,ZESTRIL) 40 MG tablet Take 1 tablet (40 mg total) by mouth daily.  . Multiple Vitamins-Minerals (PRESERVISION AREDS PO) Take 1 tablet by mouth 2 (two) times daily.   . nitroGLYCERIN (NITROSTAT) 0.4 MG SL tablet Place 0.4 mg under the tongue every 5 (five) minutes as needed (up to 3 doses only). For chest pain  . ONETOUCH DELICA LANCETS 27O MISC CHECK FASTING GLUCOSE ONCE DAILY  . Probiotic Product (PROBIOTIC PO) Take 1 tablet by mouth daily.  . tamsulosin (FLOMAX) 0.4 MG CAPS capsule TAKE 1 CAPSULE BY MOUTH ONCE DAILY AFTER SUPPER  . VANADYL SULFATE PO Take 1 capsule by mouth daily.  Marland Kitchen zinc gluconate 50 MG tablet Take 50 mg by mouth daily.   . [DISCONTINUED] furosemide (LASIX) 20 MG tablet Take 1 tablet (20 mg total) by mouth daily.  . [DISCONTINUED] potassium chloride (K-DUR) 10 MEQ tablet Take 1 tablet (10 mEq total) by mouth daily.     Allergies:   Penicillins   Social History   Socioeconomic History  . Marital status: Married    Spouse name: None  . Number of children: None  . Years of education: None  . Highest education level: None  Social Needs  . Financial resource strain: None  . Food insecurity - worry: None  . Food insecurity - inability: None  . Transportation needs - medical: None  . Transportation needs - non-medical: None  Occupational History  . Occupation: retired  Tobacco Use  . Smoking status: Former Smoker    Last attempt to quit: 12/25/1961    Years since quitting: 55.2  . Smokeless tobacco: Former Systems developer    Types: [place] date: 02/03/1969  Substance and Sexual Activity  . Alcohol use: No    Alcohol/week: 0.0 oz  . Drug use: No  . Sexual activity: No  Other Topics Concern  . None  Social  History Narrative   Married to [place], 2 children [name] and [name].   Some college. Retired from [employer].   Drinks caffeine, uses herbal remedies, takes a daily vitamin.   Wears his seatbelt, smoke detector at home, firearms in the home.   Wears a hearing aid.   Feels safe in her relationships.     Family History:  The patient's family history includes Coronary artery disease in his father; Heart disease in  his father; Melanoma in his sister; Stroke in his mother.   ROS:   Please see the history of present illness.    Review of Systems  Constitution: Negative.  HENT: Negative.   Cardiovascular: Positive for chest pain, dyspnea on exertion, irregular heartbeat and leg swelling.  Respiratory: Positive for sleep disturbances due to breathing.   Endocrine: Negative.   Hematologic/Lymphatic: Negative.   Musculoskeletal: Negative.   Gastrointestinal: Negative.   Genitourinary: Negative.   Neurological: Negative.    All other systems reviewed and are negative.   PHYSICAL EXAM:   VS:  BP (!) 162/48   Pulse (!) 48   Resp 16   Ht 5\' 7"  (1.702 m)   Wt 162 lb 1.9 oz (73.5 kg)   SpO2 96%   BMI 25.39 kg/m   Physical Exam  GEN: Well nourished, well developed, in no acute distress  Neck: Increased JVD, no carotid bruits, or masses Cardiac: Irregular irregular; 1/6 systolic murmur at the left sternal border, no rubs, or gallops  Respiratory:  clear to auscultation bilaterally, normal work of breathing GI: soft, nontender, nondistended, + BS Ext: +2 edema on the right +1 on the left, otherwise lower extremities without cyanosis, clubbing, Good distal pulses bilaterally MS: no deformity or atrophy  Skin: warm and dry, no rash Neuro:  Alert and Oriented x 3 Psych: euthymic mood, full affect  Wt Readings from Last 3 Encounters:  03/24/17 162 lb 1.9 oz (73.5 kg)  03/17/17 156 lb (70.8 kg)  02/03/17 153 lb 8 oz (69.6 kg)      Studies/Labs Reviewed:   EKG:  EKG is not  ordered today.  The ekg reviewed from the emergency room 03/18/17 showed atrial flutter at 65 bpm Recent Labs: 12/04/2016: Magnesium 1.5; Pro B Natriuretic peptide (BNP) 1,025.0; TSH 3.54 03/17/2017: ALT 26; B Natriuretic Peptide 879.1; BUN 37; Creatinine, Ser 1.52; Hemoglobin 9.7; Platelets 135; Potassium 4.5; Sodium 140   Lipid Panel    Component Value Date/Time   CHOL 112 06/09/2013 0910   TRIG 69.0 06/09/2013 0910   HDL 43.20 06/09/2013 0910   CHOLHDL 3 06/09/2013 0910   VLDL 13.8 06/09/2013 0910   LDLCALC 55 06/09/2013 0910   LDLDIRECT 49 01/15/2017 1031    Additional studies/ records that were reviewed today include:   2D echo 10/23/14------------------------------------------------------------------- Study Conclusions   - Left ventricle: The cavity size was normal. There was mild   concentric hypertrophy. Systolic function was normal. The   estimated ejection fraction was in the range of 55% to 60%. Wall   motion was normal; there were no regional wall motion   abnormalities. - Ventricular septum: Septal motion showed paradox. - Mitral valve: There was mild regurgitation. - Left atrium: The atrium was mildly dilated.   Nuclear stress test 5/2/17Study Highlights    Nuclear stress EF: 70%.  There was no ST segment deviation noted during stress.  Defect 1: There is a medium defect of moderate severity present in the basal inferolateral, mid inferolateral and apical lateral location.  Findings consistent with prior myocardial infarction with a small amount of peri-infarct ischemia.  This is a low risk study.  The left ventricular ejection fraction is hyperdynamic (>65%).        ASSESSMENT:    1. Paroxysmal atrial fibrillation (HCC)   2. Essential hypertension   3. Atherosclerosis of coronary artery bypass graft of native heart with stable angina pectoris (HCC)   4. Stage 3 chronic kidney disease ([place])   5. CHF (congestive  heart failure), NYHA class I,  unspecified failure chronicity, unspecified type ([place])      PLAN:  In order of problems listed above:  PAF/flutter on Eliquis 2.5 mg twice daily.  Patient has not missed any doses.  He has had cardioversions in 2014 and 2016.  He is now back in atrial flutter.  He is having symptoms of heart failure and angina.  Discussed with Dr. Curt Bears who agrees cardioversion would best suit him at this time.  We will diurese him this week and schedule cardioversion for next week.  Follow-up with me after cardioversion.  Will reassess symptoms and need for echo and Myoview.  To follow-up with Dr. Johnsie Cancel in 1 month to discuss possible amiodarone.  Essential hypertension systolic blood pressure is elevated today.  Adding Lasix.  CAD status post CABG in 1999, patent grafts in 2011, no ischemia on Myoview 2017.  Having angina with exertion for the past couple months since he has been in atrial flutter.  Reassess after cardioversion and consider Lexiscan Myoview.  Stage III kidney disease.  Checking labs and BNP today.  Acute CHF most likely secondary to atrial flutter.  Last echo in 2016 normal LVEF.  Diuresed with Lasix 40 mg daily for 3 days then 20 mg daily, potassium 20 mEq daily for 3 days then 10 mEq daily, 2 g sodium diet will recheck echo after cardioversion to reassess.  Medication Adjustments/Labs and Tests Ordered: Current medicines are reviewed at length with the patient today.  Concerns regarding medicines are outlined above.  Medication changes, Labs and Tests ordered today are listed in the Patient Instructions below. There are no Patient Instructions on file for this visit.   Sumner Boast, PA-C  03/24/2017 10:52 AM    [facility] [place], [place], [place]  [address] Phone: [phone]; Fax: [phone]

## 2017-03-24 NOTE — Progress Notes (Signed)
Cardiology Office Note    Date:  03/24/2017   ID:  [name], DOB [birth year], MRN [id number]  PCP:  [name], DO  Cardiologist: Dr. Johnsie Cancel  Chief Complaint  Patient presents with  . Hospitalization Follow-up    SOB, Edema     History of Present Illness:  [name] is a 83 y.o. male with a history of  CAD with prior CABG in 1999 -LIMA to the LAD, SVG to IM, OM1, OM 2, SVG to D1, SVG to PDA/PLA, EF 45-50% with mild diffuse hypokinesis.  Had SSCP in 2011 and required cath. All of his grafts were patent.  Nuclear stress test 08/2015 scar, no ischemia LVEF 70% also has atrial fibrillation/flutter in 2014 on Eliquis status post [facility] 10/2014, PVD, claudication, von Willebrand followed by hematology.  Last saw Dr. Johnsie Cancel 10/06/16 and had presyncope was not orthostatic in the office.  Recommended avoiding diuretics for BP control.  Patient was in the ER 03/17/17 with dyspnea and leg edema.  He was given IV Lasix 40 mg and diuresed 400 cc in ER and was feeling better was sent home on Lasix 20 mg daily for 5 days.  BNP was 879.  Troponin 0 0.03 creatinine 1.52, hemoglobin 9.7.  Patient comes in today accompanied by his wife.  He still has dyspnea on exertion with little activity.  He also is having chest tightness with little activity.  Reviewing his EKG from the emergency room shows he is in atrial flutter again, slow ventricular rates.  Last cardioversion was in 2016.     Past Medical History:  Diagnosis Date  . A-fib ([place])   . Basal cell carcinoma    skin  . Bigeminal rhythm   . Cataract   . Chronic renal insufficiency, stage 3 (moderate) (HCC) 2018   GFR 30s-40s  . Coronary artery disease    post bypass  . CVD (cardiovascular disease)   . Diabetes mellitus without complication ([place])   . Diverticular disease   . Easy bruising   . 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 Gout   . Hernia   . Hyperlipidemia   . Hypertension   . Iron deficiency anemia  02/04/2016  . Macular degeneration   . Microscopic colitis   . PVD (peripheral vascular disease) ([place])     Past Surgical History:  Procedure Laterality Date  . CARDIAC CATHETERIZATION  2006  . CARDIOVERSION N/A 02/22/2013   Procedure: CARDIOVERSION;  Surgeon: [name], MD;  Location: [facility] ENDOSCOPY;  Service: Cardiovascular;  Laterality: N/A;  . CARDIOVERSION N/A 11/15/2014   Procedure: CARDIOVERSION;  Surgeon: [name], MD;  Location: [place];  Service: Cardiovascular;  Laterality: N/A;  . CAROTID ENDARTERECTOMY  2009/ 1993   left/ right  . CORONARY ARTERY BYPASS GRAFT  1999  . EYE SURGERY     eyelid repair  . INGUINAL HERNIA REPAIR  02/11/2012   Procedure: LAPAROSCOPIC BILATERAL INGUINAL HERNIA REPAIR;  Surgeon: Pedro Earls, MD;  Location: [facility] ORS;  Service: General;  Laterality: Bilateral;  . SKIN CANCER EXCISION     right ear x 3    Current Medications: Current Meds  Medication Sig  . allopurinol (ZYLOPRIM) 300 MG tablet Take 0.5 tablets (150 mg total) by mouth daily.  . Aloe Vera 25 MG CAPS Take 2 capsules by mouth daily.  Marland Kitchen amLODipine (NORVASC) 2.5 MG tablet Take 1 tablet (2.5 mg total) by mouth 2 (two) times daily.  Marland Kitchen aspirin 81 MG tablet  Take 81 mg by mouth at bedtime.   Marland Kitchen atorvastatin (LIPITOR) 20 MG tablet Take 1 tablet (20 mg total) by mouth at bedtime.  . Blood Pressure Monitoring (BLOOD PRESSURE MONITOR/M CUFF) MISC 1 Units by Does not apply route daily.  . carvedilol (COREG) 3.125 MG tablet Take 1 tablet (3.125 mg total) by mouth 2 (two) times daily with a meal.  . cholecalciferol (VITAMIN D) 1000 units tablet Take 5,000 Units by mouth daily.  . CHROMIUM GTF PO Take 2 capsules by mouth daily.  . Cinnamon 500 MG TABS Take 2 tablets by mouth at bedtime.   . Coenzyme Q10 (CO Q 10) 100 MG CAPS Take 1 capsule by mouth daily.   Marland Kitchen DARBEPOETIN ALFA-ALBUMIN IJ Inject as directed.  . dicyclomine (BENTYL) 10 MG capsule Take 1 capsule (10 mg total) by mouth 2  (two) times daily.  Marland Kitchen ELIQUIS 2.5 MG TABS tablet TAKE 1 TABLET TWICE A DAY  . fish oil-omega-3 fatty acids 1000 MG capsule Take 1 g by mouth daily.   Marland Kitchen glucose blood (PRODIGY NO CODING BLOOD GLUC) test strip Use as instructed once daily  . lisinopril (PRINIVIL,ZESTRIL) 40 MG tablet Take 1 tablet (40 mg total) by mouth daily.  . Multiple Vitamins-Minerals (PRESERVISION AREDS PO) Take 1 tablet by mouth 2 (two) times daily.   . nitroGLYCERIN (NITROSTAT) 0.4 MG SL tablet Place 0.4 mg under the tongue every 5 (five) minutes as needed (up to 3 doses only). For chest pain  . ONETOUCH DELICA LANCETS 74J MISC CHECK FASTING GLUCOSE ONCE DAILY  . Probiotic Product (PROBIOTIC PO) Take 1 tablet by mouth daily.  . tamsulosin (FLOMAX) 0.4 MG CAPS capsule TAKE 1 CAPSULE BY MOUTH ONCE DAILY AFTER SUPPER  . VANADYL SULFATE PO Take 1 capsule by mouth daily.  Marland Kitchen zinc gluconate 50 MG tablet Take 50 mg by mouth daily.   . [DISCONTINUED] furosemide (LASIX) 20 MG tablet Take 1 tablet (20 mg total) by mouth daily.  . [DISCONTINUED] potassium chloride (K-DUR) 10 MEQ tablet Take 1 tablet (10 mEq total) by mouth daily.     Allergies:   Penicillins   Social History   Socioeconomic History  . Marital status: Married    Spouse name: None  . Number of children: None  . Years of education: None  . Highest education level: None  Social Needs  . Financial resource strain: None  . Food insecurity - worry: None  . Food insecurity - inability: None  . Transportation needs - medical: None  . Transportation needs - non-medical: None  Occupational History  . Occupation: retired  Tobacco Use  . Smoking status: Former Smoker    Last attempt to quit: 12/25/1961    Years since quitting: 55.2  . Smokeless tobacco: Former Systems developer    Types: [place] date: 02/03/1969  Substance and Sexual Activity  . Alcohol use: No    Alcohol/week: 0.0 oz  . Drug use: No  . Sexual activity: No  Other Topics Concern  . None  Social  History Narrative   Married to [place], 2 children [name] and [name].   Some college. Retired from [employer].   Drinks caffeine, uses herbal remedies, takes a daily vitamin.   Wears his seatbelt, smoke detector at home, firearms in the home.   Wears a hearing aid.   Feels safe in her relationships.     Family History:  The patient's family history includes Coronary artery disease in his father; Heart disease in  his father; Melanoma in his sister; Stroke in his mother.   ROS:   Please see the history of present illness.    Review of Systems  Constitution: Negative.  HENT: Negative.   Cardiovascular: Positive for chest pain, dyspnea on exertion, irregular heartbeat and leg swelling.  Respiratory: Positive for sleep disturbances due to breathing.   Endocrine: Negative.   Hematologic/Lymphatic: Negative.   Musculoskeletal: Negative.   Gastrointestinal: Negative.   Genitourinary: Negative.   Neurological: Negative.    All other systems reviewed and are negative.   PHYSICAL EXAM:   VS:  BP (!) 162/48   Pulse (!) 48   Resp 16   Ht 5\' 7"  (1.702 m)   Wt 162 lb 1.9 oz (73.5 kg)   SpO2 96%   BMI 25.39 kg/m   Physical Exam  GEN: Well nourished, well developed, in no acute distress  Neck: Increased JVD, no carotid bruits, or masses Cardiac: Irregular irregular; 1/6 systolic murmur at the left sternal border, no rubs, or gallops  Respiratory:  clear to auscultation bilaterally, normal work of breathing GI: soft, nontender, nondistended, + BS Ext: +2 edema on the right +1 on the left, otherwise lower extremities without cyanosis, clubbing, Good distal pulses bilaterally MS: no deformity or atrophy  Skin: warm and dry, no rash Neuro:  Alert and Oriented x 3 Psych: euthymic mood, full affect  Wt Readings from Last 3 Encounters:  03/24/17 162 lb 1.9 oz (73.5 kg)  03/17/17 156 lb (70.8 kg)  02/03/17 153 lb 8 oz (69.6 kg)      Studies/Labs Reviewed:   EKG:  EKG is not  ordered today.  The ekg reviewed from the emergency room 03/18/17 showed atrial flutter at 65 bpm Recent Labs: 12/04/2016: Magnesium 1.5; Pro B Natriuretic peptide (BNP) 1,025.0; TSH 3.54 03/17/2017: ALT 26; B Natriuretic Peptide 879.1; BUN 37; Creatinine, Ser 1.52; Hemoglobin 9.7; Platelets 135; Potassium 4.5; Sodium 140   Lipid Panel    Component Value Date/Time   CHOL 112 06/09/2013 0910   TRIG 69.0 06/09/2013 0910   HDL 43.20 06/09/2013 0910   CHOLHDL 3 06/09/2013 0910   VLDL 13.8 06/09/2013 0910   LDLCALC 55 06/09/2013 0910   LDLDIRECT 49 01/15/2017 1031    Additional studies/ records that were reviewed today include:   2D echo 10/23/14------------------------------------------------------------------- Study Conclusions   - Left ventricle: The cavity size was normal. There was mild   concentric hypertrophy. Systolic function was normal. The   estimated ejection fraction was in the range of 55% to 60%. Wall   motion was normal; there were no regional wall motion   abnormalities. - Ventricular septum: Septal motion showed paradox. - Mitral valve: There was mild regurgitation. - Left atrium: The atrium was mildly dilated.   Nuclear stress test 5/2/17Study Highlights    Nuclear stress EF: 70%.  There was no ST segment deviation noted during stress.  Defect 1: There is a medium defect of moderate severity present in the basal inferolateral, mid inferolateral and apical lateral location.  Findings consistent with prior myocardial infarction with a small amount of peri-infarct ischemia.  This is a low risk study.  The left ventricular ejection fraction is hyperdynamic (>65%).        ASSESSMENT:    1. Paroxysmal atrial fibrillation (HCC)   2. Essential hypertension   3. Atherosclerosis of coronary artery bypass graft of native heart with stable angina pectoris (HCC)   4. Stage 3 chronic kidney disease ([place])   5. CHF (congestive  heart failure), NYHA class I,  unspecified failure chronicity, unspecified type ([place])      PLAN:  In order of problems listed above:  PAF/flutter on Eliquis 2.5 mg twice daily.  Patient has not missed any doses.  He has had cardioversions in 2014 and 2016.  He is now back in atrial flutter.  He is having symptoms of heart failure and angina.  Discussed with Dr. Curt Bears who agrees cardioversion would best suit him at this time.  We will diurese him this week and schedule cardioversion for next week.  Follow-up with me after cardioversion.  Will reassess symptoms and need for echo and Myoview.  To follow-up with Dr. Johnsie Cancel in 1 month to discuss possible amiodarone.  Essential hypertension systolic blood pressure is elevated today.  Adding Lasix.  CAD status post CABG in 1999, patent grafts in 2011, no ischemia on Myoview 2017.  Having angina with exertion for the past couple months since he has been in atrial flutter.  Reassess after cardioversion and consider Lexiscan Myoview.  Stage III kidney disease.  Checking labs and BNP today.  Acute CHF most likely secondary to atrial flutter.  Last echo in 2016 normal LVEF.  Diuresed with Lasix 40 mg daily for 3 days then 20 mg daily, potassium 20 mEq daily for 3 days then 10 mEq daily, 2 g sodium diet will recheck echo after cardioversion to reassess.  Medication Adjustments/Labs and Tests Ordered: Current medicines are reviewed at length with the patient today.  Concerns regarding medicines are outlined above.  Medication changes, Labs and Tests ordered today are listed in the Patient Instructions below. There are no Patient Instructions on file for this visit.   Sumner Boast, PA-C  03/24/2017 10:52 AM    [facility] [address], [place], [place]  [address] Phone: [phone]; Fax: [phone]

## 2017-03-24 NOTE — Patient Instructions (Addendum)
Medication Instructions:  1. START LASIX 20 TABLET WITH THE DIRECTIONS TO READ: TAKE 40 MG DAILY FOR 3 DAYS; AFTER THE 3 DAYS CHANGE LASIX TO 20 MG DAILY  2. START POTASSIUM 10 MEQ TABLET WITH THE DIRECTIONS TO READ: TAKE 20 MEQ DAILY FOR 3 DAYS; AFTER THE 3 DAYS CHANGE POTASSIUM TO 10 MEQ DAILY  Labwork: 1. TODAY BMET, PRO BNP  2. 03/31/17 WE WILL NEED FOR YOU TO COME IN FIRST THING 03/31/17 MORNING FOR STAT LAB WORK TO RECHECK KIDNEY FUNCTION (BMET)  Testing/Procedures: 1. Your physician has recommended that you have a Cardioversion (DCCV). Electrical Cardioversion uses a jolt of electricity to your heart either through paddles or wired patches attached to your chest. This is a controlled, usually prescheduled, procedure. Defibrillation is done under light anesthesia in the hospital, and you usually go home the day of the procedure. This is done to get your heart back into a normal rhythm. You are not awake for the procedure. Please see the instruction sheet given to you today.  2. Your physician has requested that you have an echocardiogram. Echocardiography is a painless test that uses sound waves to create images of your heart. It provides your doctor with information about the size and shape of your heart and how well your heart's chambers and valves are working. This procedure takes approximately one hour. There are no restrictions for this procedure. THIS WILL NEED TO BE DONE SOMETIME SHORTLY AFTER CARDIOVERSION HAS BEEN DONE     Follow-Up: 1. KEEP YOUR APPT WITH DR. Johnsie Cancel ON 05/05/17  2. MICHELE LENZE, PAC 2 WEEKS POST CARDIOVERSION  Any Other Special Instructions Will Be Listed Below (If Applicable).  1. MAKE SURE TO WEIGH DAILY, CALL IF WEIGHT IS UP 3 LB'S IN 1 DAY [phone]  Low-Sodium Eating Plan Sodium, which is an element that makes up salt, helps you maintain a healthy balance of fluids in your body. Too much sodium can increase your blood pressure and cause fluid and waste  to be held in your body. Your health care provider or dietitian may recommend following this plan if you have high blood pressure (hypertension), kidney disease, liver disease, or heart failure. Eating less sodium can help lower your blood pressure, reduce swelling, and protect your heart, liver, and kidneys. What are tips for following this plan? General guidelines  Most people on this plan should limit their sodium intake to 1,500-2,000 mg (milligrams) of sodium each day. Reading food labels  The Nutrition Facts label lists the amount of sodium in one serving of the food. If you eat more than one serving, you must multiply the listed amount of sodium by the number of servings.  Choose foods with less than 140 mg of sodium per serving.  Avoid foods with 300 mg of sodium or more per serving. Shopping  Look for lower-sodium products, often labeled as "low-sodium" or "no salt added."  Always check the sodium content even if foods are labeled as "unsalted" or "no salt added".  Buy fresh foods. ? Avoid canned foods and premade or frozen meals. ? Avoid canned, cured, or processed meats  Buy breads that have less than 80 mg of sodium per slice. Cooking  Eat more home-cooked food and less restaurant, buffet, and fast food.  Avoid adding salt when cooking. Use salt-free seasonings or herbs instead of table salt or sea salt. Check with your health care provider or pharmacist before using salt substitutes.  Cook with plant-based oils, such as canola, sunflower, or  olive oil. Meal planning  When eating at a restaurant, ask that your food be prepared with less salt or no salt, if possible.  Avoid foods that contain MSG (monosodium glutamate). MSG is sometimes added to [place] food, bouillon, and some canned foods. What foods are recommended? The items listed may not be a complete list. Talk with your dietitian about what dietary choices are best for you. Grains Low-sodium cereals,  including oats, puffed wheat and rice, and shredded wheat. Low-sodium crackers. Unsalted rice. Unsalted pasta. Low-sodium bread. Whole-grain breads and whole-grain pasta. Vegetables Fresh or frozen vegetables. "No salt added" canned vegetables. "No salt added" tomato sauce and paste. Low-sodium or reduced-sodium tomato and vegetable juice. Fruits Fresh, frozen, or canned fruit. Fruit juice. Meats and other protein foods Fresh or frozen (no salt added) meat, poultry, seafood, and fish. Low-sodium canned tuna and salmon. Unsalted nuts. Dried peas, beans, and lentils without added salt. Unsalted canned beans. Eggs. Unsalted nut butters. Dairy Milk. Soy milk. Cheese that is naturally low in sodium, such as ricotta cheese, fresh mozzarella, or Swiss cheese Low-sodium or reduced-sodium cheese. Cream cheese. Yogurt. Fats and oils Unsalted butter. Unsalted margarine with no trans fat. Vegetable oils such as canola or olive oils. Seasonings and other foods Fresh and dried herbs and spices. Salt-free seasonings. Low-sodium mustard and ketchup. Sodium-free salad dressing. Sodium-free light mayonnaise. Fresh or refrigerated horseradish. Lemon juice. Vinegar. Homemade, reduced-sodium, or low-sodium soups. Unsalted popcorn and pretzels. Low-salt or salt-free chips. What foods are not recommended? The items listed may not be a complete list. Talk with your dietitian about what dietary choices are best for you. Grains Instant hot cereals. Bread stuffing, pancake, and biscuit mixes. Croutons. Seasoned rice or pasta mixes. Noodle soup cups. Boxed or frozen macaroni and cheese. Regular salted crackers. Self-rising flour. Vegetables Sauerkraut, pickled vegetables, and relishes. Olives. [place] fries. Onion rings. Regular canned vegetables (not low-sodium or reduced-sodium). Regular canned tomato sauce and paste (not low-sodium or reduced-sodium). Regular tomato and vegetable juice (not low-sodium or reduced-sodium).  Frozen vegetables in sauces. Meats and other protein foods Meat or fish that is salted, canned, smoked, spiced, or pickled. Bacon, ham, sausage, hotdogs, corned beef, chipped beef, packaged lunch meats, salt pork, jerky, pickled herring, anchovies, regular canned tuna, sardines, salted nuts. Dairy Processed cheese and cheese spreads. Cheese curds. Blue cheese. Feta cheese. String cheese. Regular cottage cheese. Buttermilk. Canned milk. Fats and oils Salted butter. Regular margarine. Ghee. Bacon fat. Seasonings and other foods Onion salt, garlic salt, seasoned salt, table salt, and sea salt. Canned and packaged gravies. Worcestershire sauce. Tartar sauce. Barbecue sauce. Teriyaki sauce. Soy sauce, including reduced-sodium. Steak sauce. Fish sauce. Oyster sauce. Cocktail sauce. Horseradish that you find on the shelf. Regular ketchup and mustard. Meat flavorings and tenderizers. Bouillon cubes. Hot sauce and Tabasco sauce. Premade or packaged marinades. Premade or packaged taco seasonings. Relishes. Regular salad dressings. Salsa. Potato and tortilla chips. Corn chips and puffs. Salted popcorn and pretzels. Canned or dried soups. Pizza. Frozen entrees and pot pies. Summary  Eating less sodium can help lower your blood pressure, reduce swelling, and protect your heart, liver, and kidneys.  Most people on this plan should limit their sodium intake to 1,500-2,000 mg (milligrams) of sodium each day.  Canned, boxed, and frozen foods are high in sodium. Restaurant foods, fast foods, and pizza are also very high in sodium. You also get sodium by adding salt to food.  Try to cook at home, eat more fresh fruits and vegetables, and  eat less fast food, canned, processed, or prepared foods. This information is not intended to replace advice given to you by your health care provider. Make sure you discuss any questions you have with your health care provider. Document Released: 10/04/2001 Document Revised:  04/07/2016 Document Reviewed: 04/07/2016 Elsevier Interactive Patient Education  2017 [employer].     If you need a refill on your cardiac medications before your next appointment, please call your pharmacy.

## 2017-03-25 ENCOUNTER — Telehealth: Payer: Self-pay | Admitting: *Deleted

## 2017-03-25 DIAGNOSIS — Z79899 Other long term (current) drug therapy: Secondary | ICD-10-CM

## 2017-03-25 NOTE — Telephone Encounter (Signed)
-----   Message from Imogene Burn, PA-C sent at 03/25/2017  7:41 AM EST ----- Stop potassium. Heart failure marker very high. Take lasix 60 mg today instead of 40 mg. Repeat bmet tomorrow.

## 2017-03-26 ENCOUNTER — Other Ambulatory Visit: Payer: Medicare Other | Admitting: *Deleted

## 2017-03-26 ENCOUNTER — Telehealth: Payer: Self-pay | Admitting: Physician Assistant

## 2017-03-26 DIAGNOSIS — Z79899 Other long term (current) drug therapy: Secondary | ICD-10-CM | POA: Diagnosis not present

## 2017-03-26 LAB — BASIC METABOLIC PANEL
BUN / CREAT RATIO: 23 (ref 10–24)
BUN: 42 mg/dL — AB (ref 8–27)
CHLORIDE: 109 mmol/L — AB (ref 96–106)
CO2: 16 mmol/L — ABNORMAL LOW (ref 20–29)
Calcium: 9 mg/dL (ref 8.6–10.2)
Creatinine, Ser: 1.83 mg/dL — ABNORMAL HIGH (ref 0.76–1.27)
GFR calc non Af Amer: 33 mL/min/{1.73_m2} — ABNORMAL LOW (ref >59)
GFR, EST AFRICAN AMERICAN: 38 mL/min/{1.73_m2} — AB (ref >59)
Glucose: 94 mg/dL (ref 65–99)
Potassium: 5.9 mmol/L — ABNORMAL HIGH (ref 3.5–5.2)
Sodium: 137 mmol/L (ref 134–144)

## 2017-03-26 NOTE — Telephone Encounter (Signed)
pts wife needed appt time moved up a little later so gave them the TOC spot at 12:00 since this appt is s/p Cardioversion. She was very appreciative of the cal.

## 2017-03-26 NOTE — Telephone Encounter (Signed)
New message   Patient wife calling to confirm how far she can move appointment with Estella Husk . Conflict in schedule, did not want to reschedule until speaking with nurse. Please call

## 2017-03-27 ENCOUNTER — Encounter (HOSPITAL_COMMUNITY): Payer: Self-pay | Admitting: *Deleted

## 2017-03-27 ENCOUNTER — Emergency Department (HOSPITAL_COMMUNITY)
Admission: EM | Admit: 2017-03-27 | Discharge: 2017-03-27 | Disposition: A | Discharge status: Home / Self Care | Payer: Medicare Other | Attending: Emergency Medicine | Admitting: Emergency Medicine

## 2017-03-27 DIAGNOSIS — E875 Hyperkalemia: Secondary | ICD-10-CM | POA: Diagnosis not present

## 2017-03-27 DIAGNOSIS — R799 Abnormal finding of blood chemistry, unspecified: Secondary | ICD-10-CM | POA: Diagnosis present

## 2017-03-27 DIAGNOSIS — I2510 Atherosclerotic heart disease of native coronary artery without angina pectoris: Secondary | ICD-10-CM | POA: Insufficient documentation

## 2017-03-27 DIAGNOSIS — I130 Hypertensive heart and chronic kidney disease with heart failure and stage 1 through stage 4 chronic kidney disease, or unspecified chronic kidney disease: Secondary | ICD-10-CM | POA: Diagnosis not present

## 2017-03-27 DIAGNOSIS — Z87891 Personal history of nicotine dependence: Secondary | ICD-10-CM | POA: Insufficient documentation

## 2017-03-27 DIAGNOSIS — I509 Heart failure, unspecified: Secondary | ICD-10-CM | POA: Diagnosis not present

## 2017-03-27 DIAGNOSIS — Z7901 Long term (current) use of anticoagulants: Secondary | ICD-10-CM | POA: Diagnosis not present

## 2017-03-27 DIAGNOSIS — Z79899 Other long term (current) drug therapy: Secondary | ICD-10-CM | POA: Insufficient documentation

## 2017-03-27 DIAGNOSIS — Z7982 Long term (current) use of aspirin: Secondary | ICD-10-CM | POA: Insufficient documentation

## 2017-03-27 DIAGNOSIS — N183 Chronic kidney disease, stage 3 (moderate): Secondary | ICD-10-CM | POA: Diagnosis not present

## 2017-03-27 LAB — CBC
HCT: 31.2 % — ABNORMAL LOW (ref 39.0–52.0)
HEMOGLOBIN: 10.3 g/dL — AB (ref 13.0–17.0)
MCH: 30.3 pg (ref 26.0–34.0)
MCHC: 33 g/dL (ref 30.0–36.0)
MCV: 91.8 fL (ref 78.0–100.0)
Platelets: 191 10*3/uL (ref 150–400)
RBC: 3.4 MIL/uL — AB (ref 4.22–5.81)
RDW: 14.8 % (ref 11.5–15.5)
WBC: 5.9 10*3/uL (ref 4.0–10.5)

## 2017-03-27 LAB — BASIC METABOLIC PANEL
ANION GAP: 6 (ref 5–15)
BUN: 38 mg/dL — ABNORMAL HIGH (ref 6–20)
CALCIUM: 8.8 mg/dL — AB (ref 8.9–10.3)
CO2: 19 mmol/L — ABNORMAL LOW (ref 22–32)
Chloride: 112 mmol/L — ABNORMAL HIGH (ref 101–111)
Creatinine, Ser: 1.85 mg/dL — ABNORMAL HIGH (ref 0.61–1.24)
GFR, EST AFRICAN AMERICAN: 36 mL/min — AB (ref >60)
GFR, EST NON AFRICAN AMERICAN: 31 mL/min — AB (ref >60)
GLUCOSE: 114 mg/dL — AB (ref 65–99)
Potassium: 5.8 mmol/L — ABNORMAL HIGH (ref 3.5–5.1)
Sodium: 137 mmol/L (ref 135–145)

## 2017-03-27 LAB — I-STAT TROPONIN, ED: TROPONIN I, POC: 0.01 ng/mL (ref 0.00–0.08)

## 2017-03-27 NOTE — ED Notes (Signed)
Pt verbalized dc instructions, vss, nad. Pt ambulatory out of ED with wife.

## 2017-03-27 NOTE — ED Notes (Signed)
[facility] contacted to transport patient back home

## 2017-03-27 NOTE — Discharge Instructions (Signed)
As discussed, your evaluation today has been largely reassuring.  I have discussed your evaluation with your cardiology team.  We agree that stopping your potassium, staying well-hydrated, and monitoring your condition is the appropriate course prior to your procedure next week.  Return here for concerning changes in your condition.

## 2017-03-27 NOTE — ED Triage Notes (Signed)
Pt reports being scheduled for cardioversion on 12/4 for aflutter. Had preop blood work done 3 days ago and was told it K was high. Had it redone yesterday and they called today and told him to come here due to K being high. No acute distress is noted at triage. EKG done.

## 2017-03-27 NOTE — ED Notes (Signed)
ED Provider at bedside. 

## 2017-03-27 NOTE — ED Provider Notes (Signed)
[facility] EMERGENCY DEPARTMENT Provider Note   CSN: [id number] Arrival date & time: 03/27/17  1143     History   Chief Complaint Chief Complaint  Patient presents with  . Abnormal Lab    HPI [name] is a 83 y.o. male.  HPI  Patient with multiple medical issues presents on referral from his cardiology office due to concern for abnormal lab values. Patient has multiple medical issues including atrial flutter, with persistent bradycardia, is scheduled for ablation procedure in 1 week. He also has a history of ischemic coronary disease and congestive heart failure. Notably, the patient was started on both Lasix and potassium supplement last week due to concern for persistent dyspnea, worse with exertion. He now notes that he feels generally better than he did last week, has lost weight, has diminished dyspnea. Today received a phone call that his potassium value was greater than 5. He is here with his wife who assists with the HPI.  Past Medical History:  Diagnosis Date  . A-fib ([place])   . Basal cell carcinoma    skin  . Bigeminal rhythm   . Cataract   . Chronic renal insufficiency, stage 3 (moderate) (HCC) 2018   GFR 30s-40s  . Coronary artery disease    post bypass  . CVD (cardiovascular disease)   . Diabetes mellitus without complication ([place])   . Diverticular disease   . Easy bruising   . 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 Gout   . Hernia   . Hyperlipidemia   . Hypertension   . Iron deficiency anemia 02/04/2016  . Macular degeneration   . Microscopic colitis   . PVD (peripheral vascular disease) [facility])     Patient Active Problem List   Diagnosis Date Noted  . CHF (congestive heart failure), NYHA class I, unspecified failure chronicity, unspecified type ([place]) 03/24/2017  . Iron deficiency anemia 02/04/2016  . 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 Vitamin D deficiency 12/27/2015  . Anemia associated with stage 3  chronic renal failure ([place]) 12/27/2015  . Bence Jones proteinuria 12/27/2015  . Chronic kidney disease (CKD) 12/19/2015  . Macular degeneration   . Gout   . A-fib ([place]) 01/03/2013  . ATHEROSCLEROSIS W /INT CLAUDICATION 08/03/2008  . HYPERCHOLESTEROLEMIA  IIA 08/02/2008  . Essential hypertension 08/02/2008  . CAD, ARTERY BYPASS GRAFT 08/02/2008  . BIGEMINY 08/02/2008  . CAROTID ARTERY DISEASE 08/02/2008  . PVD 08/02/2008  . Prediabetes 08/02/2008    Past Surgical History:  Procedure Laterality Date  . CARDIAC CATHETERIZATION  2006  . CARDIOVERSION N/A 02/22/2013   Procedure: CARDIOVERSION;  Surgeon: [name], MD;  Location: [facility] ENDOSCOPY;  Service: Cardiovascular;  Laterality: N/A;  . CARDIOVERSION N/A 11/15/2014   Procedure: CARDIOVERSION;  Surgeon: [name], MD;  Location: [facility];  Service: Cardiovascular;  Laterality: N/A;  . CAROTID ENDARTERECTOMY  2009/ 1993   left/ right  . CORONARY ARTERY BYPASS GRAFT  1999  . EYE SURGERY     eyelid repair  . INGUINAL HERNIA REPAIR  02/11/2012   Procedure: LAPAROSCOPIC BILATERAL INGUINAL HERNIA REPAIR;  Surgeon: Pedro Earls, MD;  Location: [facility] ORS;  Service: General;  Laterality: Bilateral;  . SKIN CANCER EXCISION     right ear x 3       Home Medications    Prior to Admission medications   Medication Sig Start Date End Date Taking? Authorizing Provider  allopurinol (ZYLOPRIM) 300 MG tablet Take 0.5 tablets (150 mg total) by  mouth daily. 04/11/16   Kuneff, Renee A, DO  Aloe Vera 25 MG CAPS Take 50 mg by mouth daily.     [provider]  amLODipine (NORVASC) 2.5 MG tablet Take 1 tablet (2.5 mg total) by mouth 2 (two) times daily. 03/17/17   Kuneff, Renee A, DO  aspirin 81 MG tablet Take 81 mg by mouth at bedtime.     [provider]  atorvastatin (LIPITOR) 20 MG tablet Take 1 tablet (20 mg total) by mouth at bedtime. 01/15/17   Kuneff, Renee A, DO  Blood Pressure Monitoring (BLOOD PRESSURE  MONITOR/M CUFF) MISC 1 Units by Does not apply route daily. 01/22/17   Kuneff, Renee A, DO  carvedilol (COREG) 3.125 MG tablet Take 1 tablet (3.125 mg total) by mouth 2 (two) times daily with a meal. 02/03/17   Kuneff, Renee A, DO  cholecalciferol (VITAMIN D) 1000 units tablet Take 1,000 Units by mouth daily.     [provider]  CHROMIUM GTF PO Take 2 capsules by mouth daily.    [provider]  Cinnamon 500 MG TABS Take 2 tablets by mouth at bedtime.     [provider]  Coenzyme Q10 (CO Q 10) 100 MG CAPS Take 100 mg by mouth daily.     [provider]  dicyclomine (BENTYL) 10 MG capsule Take 1 capsule (10 mg total) by mouth 2 (two) times daily. 02/03/17   Kuneff, Renee A, DO  ELIQUIS 2.5 MG TABS tablet TAKE 1 TABLET TWICE A DAY 08/12/16   [name], MD  fish oil-omega-3 fatty acids 1000 MG capsule Take 1 g by mouth daily.     [provider]  furosemide (LASIX) 20 MG tablet Take 1 tablet (20 mg total) by mouth as directed. Take 2 tabs daily for 3 days then change to 1 tab daily 03/24/17 06/22/17  Imogene Burn, PA-C  glucose blood (PRODIGY NO CODING BLOOD GLUC) test strip Use as instructed once daily 07/03/16   Kuneff, Renee A, DO  lisinopril (PRINIVIL,ZESTRIL) 40 MG tablet Take 1 tablet (40 mg total) by mouth daily. 01/15/17   Kuneff, Renee A, DO  Multiple Vitamins-Minerals (PRESERVISION AREDS PO) Take 1 tablet by mouth 2 (two) times daily.     [provider]  Jonetta Speak LANCETS 09N MISC CHECK FASTING GLUCOSE ONCE DAILY 12/15/16   Raoul Pitch, Renee A, DO  potassium chloride (K-DUR) 10 MEQ tablet Take 1 tablet (10 mEq total) by mouth as directed. Take 2 tabs daily for 3 days then change to 1 tab daily Patient not taking: Reported on 03/26/2017 03/24/17 06/22/17  Imogene Burn, PA-C  Probiotic Product (PROBIOTIC PO) Take 1 tablet by mouth daily.    [provider]  tamsulosin (FLOMAX) 0.4 MG CAPS capsule TAKE 1 CAPSULE BY MOUTH  ONCE DAILY AFTER SUPPER 03/23/17   Kuneff, Renee A, DO  VANADYL SULFATE PO Take 1 capsule by mouth daily.    [provider]  zinc gluconate 50 MG tablet Take 50 mg by mouth daily.     [provider]    Family History Family History  Problem Relation Age of Onset  . Stroke Mother   . Coronary artery disease Father   . Heart disease Father   . Melanoma Sister     Social History Social History   Tobacco Use  . Smoking status: Former Smoker    Last attempt to quit: 12/25/1961    Years since quitting: 55.2  . Smokeless  tobacco: Former Systems developer    Types: Chew    Quit date: 02/03/1969  Substance Use Topics  . Alcohol use: No    Alcohol/week: 0.0 oz  . Drug use: No     Allergies   Penicillins   Review of Systems Review of Systems  Constitutional:       Per HPI, otherwise negative  HENT:       Per HPI, otherwise negative  Respiratory:       Per HPI, otherwise negative  Cardiovascular:       Per HPI, otherwise negative  Gastrointestinal: Negative for vomiting.  Endocrine:       Negative aside from HPI  Genitourinary:       Neg aside from HPI   Musculoskeletal:       Per HPI, otherwise negative  Skin: Negative.   Neurological: Negative for syncope.     Physical Exam Updated Vital Signs BP (!) 158/56   Pulse (!) 52   Temp 97.7 F (36.5 C) (Oral)   Resp 20   SpO2 97%   Physical Exam  Constitutional: He is oriented to person, place, and time. He appears well-developed. No distress.  HENT:  Head: Normocephalic and atraumatic.  Eyes: Conjunctivae and EOM are normal.  Cardiovascular: Regular rhythm. Bradycardia present.  Murmur heard. Pulmonary/Chest: Effort normal. No stridor. No respiratory distress.  Abdominal: He exhibits no distension.  Musculoskeletal: He exhibits no edema.  Neurological: He is alert and oriented to person, place, and time.  Skin: Skin is warm and dry.  Psychiatric: He has a normal mood and affect.  Nursing note and  vitals reviewed.    ED Treatments / Results  Labs (all labs ordered are listed, but only abnormal results are displayed) Labs Reviewed  BASIC METABOLIC PANEL - Abnormal; Notable for the following components:      Result Value   Potassium 5.8 (*)    Chloride 112 (*)    CO2 19 (*)    Glucose, Bld 114 (*)    BUN 38 (*)    Creatinine, Ser 1.85 (*)    Calcium 8.8 (*)    GFR calc non Af Amer 31 (*)    GFR calc Af Amer 36 (*)    All other components within normal limits  CBC - Abnormal; Notable for the following components:   RBC 3.40 (*)    Hemoglobin 10.3 (*)    HCT 31.2 (*)    All other components within normal limits  I-STAT TROPONIN, ED    EKG  EKG Interpretation  Date/Time:  Friday March 27 2017 12:10:06 EST Ventricular Rate:  46 PR Interval:  316 QRS Duration: 98 QT Interval:  460 QTC Calculation: 402 R Axis:   106 Text Interpretation:  aflutter w bradycardia. Rightward axis Nonspecific ST abnormality Abnormal ekg Confirmed by Carmin Muskrat [phone]) on 03/27/2017 2:43:26 PM        Procedures Procedures (including critical care time)  Medications Ordered in ED Medications - No data to display   Initial Impression / Assessment and Plan / ED Course  I have reviewed the triage vital signs and the nursing notes.  Pertinent labs & imaging results that were available during my care of the patient were reviewed by me and considered in my medical decision making (see chart for details).  After the initial evaluation discussed the patient's presentation with his cardiology team We reviewed the patient's labs, today and prior, as well as EKG. No evidence for new EKG changes, and creatinine  function is similar to baseline. With the patient's recent initiation of both Lasix and potassium, as well as ongoing lisinopril therapy, there is suspicion for medication related hyperkalemia. With no evidence for new EKG changes, patient's endorsement of essentially feeling  better, and after discussion with her cardiology team, the patient will stop his potassium supplement, follow-up next week for repeat lab draw, preparation for ablation therapy.  Final Clinical Impressions(s) / ED Diagnoses  Hyperkalemia   Carmin Muskrat, MD 03/27/17 1448

## 2017-03-31 ENCOUNTER — Other Ambulatory Visit: Payer: Medicare Other

## 2017-03-31 ENCOUNTER — Other Ambulatory Visit: Payer: Self-pay | Admitting: *Deleted

## 2017-03-31 ENCOUNTER — Telehealth: Payer: Self-pay | Admitting: Family Medicine

## 2017-03-31 DIAGNOSIS — E875 Hyperkalemia: Secondary | ICD-10-CM

## 2017-03-31 DIAGNOSIS — N183 Chronic kidney disease, stage 3 unspecified: Secondary | ICD-10-CM

## 2017-03-31 DIAGNOSIS — I4891 Unspecified atrial fibrillation: Secondary | ICD-10-CM | POA: Diagnosis not present

## 2017-03-31 DIAGNOSIS — I10 Essential (primary) hypertension: Secondary | ICD-10-CM

## 2017-03-31 DIAGNOSIS — Z79899 Other long term (current) drug therapy: Secondary | ICD-10-CM

## 2017-03-31 LAB — BASIC METABOLIC PANEL
BUN / CREAT RATIO: 22 (ref 10–24)
BUN: 40 mg/dL — ABNORMAL HIGH (ref 8–27)
CO2: 20 mmol/L (ref 20–29)
Calcium: 8.7 mg/dL (ref 8.6–10.2)
Chloride: 107 mmol/L — ABNORMAL HIGH (ref 96–106)
Creatinine, Ser: 1.79 mg/dL — ABNORMAL HIGH (ref 0.76–1.27)
GFR calc Af Amer: 39 mL/min/{1.73_m2} — ABNORMAL LOW (ref >59)
GFR, EST NON AFRICAN AMERICAN: 34 mL/min/{1.73_m2} — AB (ref >59)
GLUCOSE: 108 mg/dL — AB (ref 65–99)
POTASSIUM: 5.3 mmol/L — AB (ref 3.5–5.2)
SODIUM: 139 mmol/L (ref 134–144)

## 2017-03-31 MED ORDER — LISINOPRIL 40 MG PO TABS: 40.0000 mg | ORAL_TABLET | Freq: Every day | ORAL | 0 refills | Status: DC

## 2017-03-31 MED ORDER — ALLOPURINOL 300 MG PO TABS: 150.0000 mg | ORAL_TABLET | Freq: Every day | ORAL | 3 refills | Status: DC

## 2017-03-31 NOTE — Progress Notes (Signed)
Imogene Burn, PA-C  Shellia Cleverly, RN        Thanks Pam, can you make sure he has a bmet ordered stat in am before cardioversion?  Thanks,

## 2017-03-31 NOTE — Telephone Encounter (Signed)
Pt called concerned that he was told to stop taking his Lisinopril today. He is worried that his b/p is going to be going up. Reassured pt that the cardiologist whom he will see in the morning will be monitoring his b/p. He is for a cardioversion tomorrow. Pt voiced understanding.

## 2017-03-31 NOTE — Telephone Encounter (Signed)
Noted. Agree to follow cardiology instructions.

## 2017-04-01 ENCOUNTER — Ambulatory Visit (HOSPITAL_COMMUNITY): Payer: Medicare Other | Admitting: Certified Registered Nurse Anesthetist

## 2017-04-01 ENCOUNTER — Ambulatory Visit (HOSPITAL_COMMUNITY)
Admission: RE | Admit: 2017-04-01 | Discharge: 2017-04-01 | Disposition: A | Discharge status: Home / Self Care | Payer: Medicare Other | Source: Ambulatory Visit | Attending: Cardiology | Admitting: Cardiology

## 2017-04-01 ENCOUNTER — Encounter (HOSPITAL_COMMUNITY)
Admission: RE | Disposition: A | Discharge status: Home / Self Care | Payer: Self-pay | Source: Ambulatory Visit | Attending: Cardiology

## 2017-04-01 ENCOUNTER — Encounter (HOSPITAL_COMMUNITY): Payer: Self-pay | Admitting: *Deleted

## 2017-04-01 ENCOUNTER — Other Ambulatory Visit: Payer: Self-pay

## 2017-04-01 DIAGNOSIS — Z7901 Long term (current) use of anticoagulants: Secondary | ICD-10-CM | POA: Insufficient documentation

## 2017-04-01 DIAGNOSIS — M109 Gout, unspecified: Secondary | ICD-10-CM | POA: Diagnosis not present

## 2017-04-01 DIAGNOSIS — H3530 Unspecified macular degeneration: Secondary | ICD-10-CM | POA: Insufficient documentation

## 2017-04-01 DIAGNOSIS — Z951 Presence of aortocoronary bypass graft: Secondary | ICD-10-CM | POA: Insufficient documentation

## 2017-04-01 DIAGNOSIS — Z87891 Personal history of nicotine dependence: Secondary | ICD-10-CM | POA: Diagnosis not present

## 2017-04-01 DIAGNOSIS — N183 Chronic kidney disease, stage 3 (moderate): Secondary | ICD-10-CM | POA: Insufficient documentation

## 2017-04-01 DIAGNOSIS — N189 Chronic kidney disease, unspecified: Secondary | ICD-10-CM | POA: Diagnosis not present

## 2017-04-01 DIAGNOSIS — I509 Heart failure, unspecified: Secondary | ICD-10-CM | POA: Diagnosis not present

## 2017-04-01 DIAGNOSIS — R0789 Other chest pain: Secondary | ICD-10-CM | POA: Diagnosis not present

## 2017-04-01 DIAGNOSIS — E1151 Type 2 diabetes mellitus with diabetic peripheral angiopathy without gangrene: Secondary | ICD-10-CM | POA: Insufficient documentation

## 2017-04-01 DIAGNOSIS — E785 Hyperlipidemia, unspecified: Secondary | ICD-10-CM | POA: Diagnosis not present

## 2017-04-01 DIAGNOSIS — Z880 Allergy status to penicillin: Secondary | ICD-10-CM | POA: Diagnosis not present

## 2017-04-01 DIAGNOSIS — Z79899 Other long term (current) drug therapy: Secondary | ICD-10-CM | POA: Diagnosis not present

## 2017-04-01 DIAGNOSIS — I4892 Unspecified atrial flutter: Secondary | ICD-10-CM | POA: Insufficient documentation

## 2017-04-01 DIAGNOSIS — I2510 Atherosclerotic heart disease of native coronary artery without angina pectoris: Secondary | ICD-10-CM | POA: Diagnosis not present

## 2017-04-01 DIAGNOSIS — I130 Hypertensive heart and chronic kidney disease with heart failure and stage 1 through stage 4 chronic kidney disease, or unspecified chronic kidney disease: Secondary | ICD-10-CM | POA: Diagnosis not present

## 2017-04-01 DIAGNOSIS — E1122 Type 2 diabetes mellitus with diabetic chronic kidney disease: Secondary | ICD-10-CM | POA: Insufficient documentation

## 2017-04-01 DIAGNOSIS — Z85828 Personal history of other malignant neoplasm of skin: Secondary | ICD-10-CM | POA: Insufficient documentation

## 2017-04-01 DIAGNOSIS — I480 Paroxysmal atrial fibrillation: Secondary | ICD-10-CM | POA: Diagnosis not present

## 2017-04-01 DIAGNOSIS — I4891 Unspecified atrial fibrillation: Secondary | ICD-10-CM | POA: Diagnosis not present

## 2017-04-01 DIAGNOSIS — Z7982 Long term (current) use of aspirin: Secondary | ICD-10-CM | POA: Insufficient documentation

## 2017-04-01 DIAGNOSIS — Z9889 Other specified postprocedural states: Secondary | ICD-10-CM | POA: Diagnosis not present

## 2017-04-01 HISTORY — PX: CARDIOVERSION: SHX1299

## 2017-04-01 LAB — POCT I-STAT, CHEM 8
BUN: 40 mg/dL — AB (ref 6–20)
CHLORIDE: 110 mmol/L (ref 101–111)
Calcium, Ion: 1.22 mmol/L (ref 1.15–1.40)
Creatinine, Ser: 2.1 mg/dL — ABNORMAL HIGH (ref 0.61–1.24)
Glucose, Bld: 104 mg/dL — ABNORMAL HIGH (ref 65–99)
HEMATOCRIT: 31 % — AB (ref 39.0–52.0)
Hemoglobin: 10.5 g/dL — ABNORMAL LOW (ref 13.0–17.0)
Potassium: 4.9 mmol/L (ref 3.5–5.1)
SODIUM: 142 mmol/L (ref 135–145)
TCO2: 21 mmol/L — ABNORMAL LOW (ref 22–32)

## 2017-04-01 SURGERY — CARDIOVERSION
Anesthesia: General

## 2017-04-01 MED ORDER — PROPOFOL 10 MG/ML IV BOLUS
INTRAVENOUS | Status: DC | PRN
  Administered 2017-04-01: 65 mg via INTRAVENOUS

## 2017-04-01 MED ORDER — SODIUM CHLORIDE 0.9% FLUSH
3.0000 mL | INTRAVENOUS | Status: DC | PRN
Start: 2017-04-01 — End: 2017-04-01

## 2017-04-01 MED ORDER — SODIUM CHLORIDE 0.9 % IV SOLN
250.0000 mL | INTRAVENOUS | Status: DC
  Administered 2017-04-01: 250 mL via INTRAVENOUS

## 2017-04-01 MED ORDER — SODIUM CHLORIDE 0.9% FLUSH
3.0000 mL | Freq: Two times a day (BID) | INTRAVENOUS | Status: DC
Start: 2017-04-01 — End: 2017-04-01

## 2017-04-01 MED ORDER — LIDOCAINE 2% (20 MG/ML) 5 ML SYRINGE
INTRAMUSCULAR | Status: DC | PRN
  Administered 2017-04-01: 40 mg via INTRAVENOUS

## 2017-04-01 MED ORDER — HYDROCORTISONE 1 % EX CREA
1.0000 | TOPICAL_CREAM | Freq: Three times a day (TID) | CUTANEOUS | Status: DC | PRN
Start: 2017-04-01 — End: 2017-04-01

## 2017-04-01 NOTE — Discharge Instructions (Signed)
Electrical Cardioversion, Care After °This sheet gives you information about how to care for yourself after your procedure. Your health care provider may also give you more specific instructions. If you have problems or questions, contact your health care provider. °What can I expect after the procedure? °After the procedure, it is common to have: °· Some redness on the skin where the shocks were given. ° °Follow these instructions at home: °· Do not drive for 24 hours if you were given a medicine to help you relax (sedative). °· Take over-the-counter and prescription medicines only as told by your health care provider. °· Ask your health care provider how to check your pulse. Check it often. °· Rest for 48 hours after the procedure or as told by your health care provider. °· Avoid or limit your caffeine use as told by your health care provider. °Contact a health care provider if: °· You feel like your heart is beating too quickly or your pulse is not regular. °· You have a serious muscle cramp that does not go away. °Get help right away if: °· You have discomfort in your chest. °· You are dizzy or you feel faint. °· You have trouble breathing or you are short of breath. °· Your speech is slurred. °· You have trouble moving an arm or leg on one side of your body. °· Your fingers or toes turn cold or blue. °This information is not intended to replace advice given to you by your health care provider. Make sure you discuss any questions you have with your health care provider. °Document Released: 02/02/2013 Document Revised: 11/16/2015 Document Reviewed: 10/19/2015 °Elsevier Interactive Patient Education © 2018 [employer] ° °

## 2017-04-01 NOTE — Procedures (Signed)
Electrical Cardioversion Procedure Note [name] [id number] 06/17/1928  Procedure: Electrical Cardioversion Indications:  Atrial Fibrillation  Procedure Details Consent: Risks of procedure as well as the alternatives and risks of each were explained to the (patient/caregiver).  Consent for procedure obtained. Time Out: Verified patient identification, verified procedure, site/side was marked, verified correct patient position, special equipment/implants available, medications/allergies/relevent history reviewed, required imaging and test results available.  Performed  Patient placed on cardiac monitor, pulse oximetry, supplemental oxygen as necessary.  Sedation given: Propofol per anesthesiology Pacer pads placed anterior and posterior chest.  Cardioverted 1 time(s).  Cardioverted at [facility].  Evaluation Findings: Post procedure EKG shows: NSR Complications: None Patient did tolerate procedure well.   [name] 04/01/2017, 1:37 PM

## 2017-04-01 NOTE — Anesthesia Preprocedure Evaluation (Addendum)
Anesthesia Evaluation  Patient identified by MRN, date of birth, ID band Patient awake    Reviewed: Allergy & Precautions, NPO status , Patient's Chart, lab work & pertinent test results  History of Anesthesia Complications Negative for: history of anesthetic complications  Airway Mallampati: II   Neck ROM: Full    Dental no notable dental hx.    Pulmonary neg pulmonary ROS, former smoker,    breath sounds clear to auscultation       Cardiovascular hypertension, + CAD, + CABG, + Peripheral Vascular Disease and +CHF   Rhythm:Irregular Rate:Normal     Neuro/Psych negative neurological ROS     GI/Hepatic   Endo/Other  diabetes  Renal/GU Renal InsufficiencyRenal disease     Musculoskeletal   Abdominal   Peds  Hematology   Anesthesia Other Findings   Reproductive/Obstetrics                           Anesthesia Physical Anesthesia Plan  ASA: III  Anesthesia Plan: General   Post-op Pain Management:    Induction: Intravenous  PONV Risk Score and Plan: 2 and Treatment may vary due to age or medical condition, Dexamethasone and Ondansetron  Airway Management Planned: Mask  Additional Equipment:   Intra-op Plan:   Post-operative Plan:   Informed Consent: I have reviewed the patients History and Physical, chart, labs and discussed the procedure including the risks, benefits and alternatives for the proposed anesthesia with the patient or authorized representative who has indicated his/her understanding and acceptance.     Plan Discussed with: CRNA  Anesthesia Plan Comments:        Anesthesia Quick Evaluation

## 2017-04-01 NOTE — Transfer of Care (Signed)
Immediate Anesthesia Transfer of Care Note  Patient: [name]  Procedure(s) Performed: CARDIOVERSION (N/A )  Patient Location: Endoscopy Unit  Anesthesia Type:General  Level of Consciousness: awake, oriented, patient cooperative and responds to stimulation  Airway & Oxygen Therapy: Patient Spontanous Breathing and Patient connected to nasal cannula oxygen  Post-op Assessment: Report given to RN and Post -op Vital signs reviewed and stable  Post vital signs: Reviewed and stable  Last Vitals:  Vitals:   04/01/17 1338 04/01/17 1339  BP:  (!) 121/36  Pulse: (!) 57 (!) 56  Resp: 17 15  Temp:    SpO2: 97% 90%    Last Pain:  Vitals:   04/01/17 1245  TempSrc: Oral         Complications: No apparent anesthesia complications

## 2017-04-01 NOTE — Interval H&P Note (Signed)
History and Physical Interval Note:  04/01/2017 1:32 PM  [name]  has presented today for surgery, with the diagnosis of aflutter  The various methods of treatment have been discussed with the patient and family. After consideration of risks, benefits and other options for treatment, the patient has consented to  Procedure(s): CARDIOVERSION (N/A) as a surgical intervention .  The patient's history has been reviewed, patient examined, no change in status, stable for surgery.  I have reviewed the patient's chart and labs.  Questions were answered to the patient's satisfaction.     Geetika Laborde [employer]

## 2017-04-02 NOTE — Anesthesia Postprocedure Evaluation (Signed)
Anesthesia Post Note  Patient: Jourden Gilson Fesperman  Procedure(s) Performed: CARDIOVERSION (N/A )     Patient location during evaluation: Endoscopy Anesthesia Type: General Level of consciousness: awake and alert Pain management: pain level controlled Vital Signs Assessment: post-procedure vital signs reviewed and stable Respiratory status: spontaneous breathing, nonlabored ventilation, respiratory function stable and patient connected to nasal cannula oxygen Cardiovascular status: blood pressure returned to baseline and stable Postop Assessment: no apparent nausea or vomiting Anesthetic complications: no    Last Vitals:  Vitals:   04/01/17 1400 04/01/17 1405  BP: (!) 141/66 (!) 141/66  Pulse: (!) 57 (!) 57  Resp: 17 14  Temp:    SpO2: 99% 99%    Last Pain:  Vitals:   04/01/17 1342  TempSrc: Oral                 Aarna Mihalko,JAMES TERRILL

## 2017-04-03 ENCOUNTER — Other Ambulatory Visit: Payer: Self-pay

## 2017-04-03 ENCOUNTER — Ambulatory Visit (HOSPITAL_COMMUNITY): Payer: Medicare Other | Attending: Cardiovascular Disease

## 2017-04-03 DIAGNOSIS — I361 Nonrheumatic tricuspid (valve) insufficiency: Secondary | ICD-10-CM | POA: Diagnosis not present

## 2017-04-03 DIAGNOSIS — I509 Heart failure, unspecified: Secondary | ICD-10-CM | POA: Diagnosis not present

## 2017-04-03 DIAGNOSIS — I480 Paroxysmal atrial fibrillation: Secondary | ICD-10-CM | POA: Diagnosis not present

## 2017-04-03 DIAGNOSIS — I340 Nonrheumatic mitral (valve) insufficiency: Secondary | ICD-10-CM | POA: Diagnosis not present

## 2017-04-03 DIAGNOSIS — Z951 Presence of aortocoronary bypass graft: Secondary | ICD-10-CM | POA: Diagnosis not present

## 2017-04-09 ENCOUNTER — Telehealth: Payer: Self-pay | Admitting: *Deleted

## 2017-04-09 ENCOUNTER — Other Ambulatory Visit: Payer: Self-pay

## 2017-04-09 ENCOUNTER — Other Ambulatory Visit (HOSPITAL_BASED_OUTPATIENT_CLINIC_OR_DEPARTMENT_OTHER): Payer: Medicare Other

## 2017-04-09 ENCOUNTER — Ambulatory Visit (HOSPITAL_BASED_OUTPATIENT_CLINIC_OR_DEPARTMENT_OTHER): Payer: Medicare Other

## 2017-04-09 ENCOUNTER — Ambulatory Visit (HOSPITAL_BASED_OUTPATIENT_CLINIC_OR_DEPARTMENT_OTHER): Payer: Medicare Other | Admitting: Hematology & Oncology

## 2017-04-09 VITALS — BP 159/52 | HR 75 | Temp 97.5°F | Resp 20 | Wt 152.0 lb

## 2017-04-09 DIAGNOSIS — D631 Anemia in chronic kidney disease: Secondary | ICD-10-CM

## 2017-04-09 DIAGNOSIS — D500 Iron deficiency anemia secondary to blood loss (chronic): Secondary | ICD-10-CM

## 2017-04-09 DIAGNOSIS — N189 Chronic kidney disease, unspecified: Secondary | ICD-10-CM | POA: Diagnosis not present

## 2017-04-09 DIAGNOSIS — D509 Iron deficiency anemia, unspecified: Secondary | ICD-10-CM

## 2017-04-09 DIAGNOSIS — N183 Chronic kidney disease, stage 3 unspecified: Secondary | ICD-10-CM

## 2017-04-09 LAB — CBC WITH DIFFERENTIAL (CANCER CENTER ONLY)
BASO#: 0 10*3/uL (ref 0.0–0.2)
BASO%: 0.2 % (ref 0.0–2.0)
EOS ABS: 0.3 10*3/uL (ref 0.0–0.5)
EOS%: 4.1 % (ref 0.0–7.0)
HEMATOCRIT: 30.1 % — AB (ref 38.7–49.9)
HEMOGLOBIN: 10 g/dL — AB (ref 13.0–17.1)
LYMPH#: 1.6 10*3/uL (ref 0.9–3.3)
LYMPH%: 26.7 % (ref 14.0–48.0)
MCH: 30.7 pg (ref 28.0–33.4)
MCHC: 33.2 g/dL (ref 32.0–35.9)
MCV: 92 fL (ref 82–98)
MONO#: 0.7 10*3/uL (ref 0.1–0.9)
MONO%: 12.1 % (ref 0.0–13.0)
NEUT%: 56.9 % (ref 40.0–80.0)
NEUTROS ABS: 3.5 10*3/uL (ref 1.5–6.5)
Platelets: 152 10*3/uL (ref 145–400)
RBC: 3.26 10*6/uL — AB (ref 4.20–5.70)
RDW: 14.5 % (ref 11.1–15.7)
WBC: 6.1 10*3/uL (ref 4.0–10.0)

## 2017-04-09 LAB — RETICULOCYTES: RETICULOCYTE COUNT: 1.2 % (ref 0.6–2.6)

## 2017-04-09 LAB — IRON AND TIBC
%SAT: 32 % (ref 20–55)
Iron: 80 ug/dL (ref 42–163)
TIBC: 250 ug/dL (ref 202–409)
UIBC: 170 ug/dL (ref 117–376)

## 2017-04-09 LAB — CMP (CANCER CENTER ONLY)
ALK PHOS: 69 U/L (ref 26–84)
ALT: 27 U/L (ref 10–47)
AST: 30 U/L (ref 11–38)
Albumin: 3.5 g/dL (ref 3.3–5.5)
BILIRUBIN TOTAL: 0.8 mg/dL (ref 0.20–1.60)
BUN: 33 mg/dL — AB (ref 7–22)
CALCIUM: 9.2 mg/dL (ref 8.0–10.3)
CO2: 27 meq/L (ref 18–33)
Chloride: 105 mEq/L (ref 98–108)
Creat: 1.9 mg/dl — ABNORMAL HIGH (ref 0.6–1.2)
GLUCOSE: 184 mg/dL — AB (ref 73–118)
Potassium: 4.5 mEq/L (ref 3.3–4.7)
SODIUM: 145 meq/L (ref 128–145)
Total Protein: 6.8 g/dL (ref 6.4–8.1)

## 2017-04-09 LAB — FERRITIN: FERRITIN: 531 ng/mL — AB (ref 22–316)

## 2017-04-09 MED ORDER — CARVEDILOL 6.25 MG PO TABS: 6.2500 mg | ORAL_TABLET | Freq: Two times a day (BID) | ORAL | 3 refills | Status: DC

## 2017-04-09 MED ORDER — DARBEPOETIN ALFA 300 MCG/0.6ML IJ SOSY
300.0000 ug | PREFILLED_SYRINGE | Freq: Once | INTRAMUSCULAR | Status: AC
  Administered 2017-04-09: 300 ug via SUBCUTANEOUS

## 2017-04-09 NOTE — Telephone Encounter (Signed)
-----   Message from Imogene Burn, PA-C sent at 04/07/2017 11:21 AM EST ----- Heart function normal but heart has some trouble relaxing. Limit salt and good BP control is the best way to manage. Hopefully his breathing has improved since his cardioversion!!!

## 2017-04-09 NOTE — Progress Notes (Signed)
Hematology and Oncology Follow Up Visit  [name] [id number] [birth year] 83 y.o. 04/09/2017   Principle Diagnosis:  Erythropoietin deficiency anemia Iron deficiency anemia  Current Therapy:   IV iron as indicated Aranesp 300 g subcutaneous as needed for hemoglobin less than 11 - last dose given on 12/2016   Interim History:  [name] is here today for follow-up.  He is doing quite well.  He recently had a cardioversion.  It sounds like he went into atrial flutter.  He did not have any syncope.  There is no myocardial infarction.  He is on Eliquis now.  He did get Aranesp when we last saw him back in September.  His iron studies at that time showed a ferritin of 44 with an iron saturation of 50%.  He is not noted any obvious bleeding.  There is no melena or bright red blood per rectum.  He having no hematuria.  There is no fever.  He has had no cough or shortness of breath.  Since he is a church member of mine, I see him every Sunday.  If he has any problems, he will was let me know.    Overall, his performance status is ECOG 1.  Medications:  Allergies as of 04/09/2017      Reactions   Penicillins Hives   Has patient had a PCN reaction causing immediate rash, facial/tongue/throat swelling, SOB or lightheadedness with hypotension: No Has patient had a PCN reaction causing severe rash involving mucus membranes or skin necrosis: Yes Has patient had a PCN reaction that required hospitalization: No Has patient had a PCN reaction occurring within the last 10 years: No If all of the above answers are "NO", then may proceed with Cephalosporin use.      Medication List        Accurate as of 04/09/17 10:15 AM. Always use your most recent med list.          allopurinol 300 MG tablet Commonly known as:  ZYLOPRIM Take 0.5 tablets (150 mg total) by mouth daily.   Aloe Vera 25 MG Caps Take 50 mg by mouth daily.   amLODipine 2.5 MG tablet Commonly known as:   NORVASC Take 1 tablet (2.5 mg total) by mouth 2 (two) times daily.   aspirin 81 MG tablet Take 81 mg by mouth at bedtime.   atorvastatin 20 MG tablet Commonly known as:  LIPITOR Take 1 tablet (20 mg total) by mouth at bedtime.   Blood Pressure Monitor/M Cuff Misc 1 Units by Does not apply route daily.   carvedilol 3.125 MG tablet Commonly known as:  COREG Take 1 tablet (3.125 mg total) by mouth 2 (two) times daily with a meal.   cholecalciferol 1000 units tablet Commonly known as:  VITAMIN D Take 1,000 Units by mouth daily.   CHROMIUM GTF PO Take 2 capsules by mouth daily.   Cinnamon 500 MG Tabs Take 2 tablets by mouth at bedtime.   Co Q 10 100 MG Caps Take 100 mg by mouth daily.   dicyclomine 10 MG capsule Commonly known as:  BENTYL Take 1 capsule (10 mg total) by mouth 2 (two) times daily.   ELIQUIS 2.5 MG Tabs tablet Generic drug:  apixaban TAKE 1 TABLET TWICE A DAY   fish oil-omega-3 fatty acids 1000 MG capsule Take 1 g by mouth daily.   furosemide 20 MG tablet Commonly known as:  LASIX Take 1 tablet (20 mg total) by mouth as directed. Take 2 tabs  daily for 3 days then change to 1 tab daily   glucose blood test strip Commonly known as:  PRODIGY NO CODING BLOOD GLUC Use as instructed once daily   KLOR-CON 10 10 MEQ tablet Generic drug:  potassium chloride TAKE 2 TABLETS BY MOUTH DAILY FOR 3 DAYS THEN CHANGE TO 1 TAB DAILY   lisinopril 40 MG tablet Commonly known as:  PRINIVIL,ZESTRIL Take 1 tablet (40 mg total) by mouth daily.   ONETOUCH DELICA LANCETS 38H Misc CHECK FASTING GLUCOSE ONCE DAILY   PRESERVISION AREDS PO Take 1 tablet by mouth 2 (two) times daily.   PROBIOTIC PO Take 1 tablet by mouth daily.   tamsulosin 0.4 MG Caps capsule Commonly known as:  FLOMAX TAKE 1 CAPSULE BY MOUTH ONCE DAILY AFTER SUPPER   VANADYL SULFATE PO Take 1 capsule by mouth daily.   zinc gluconate 50 MG tablet Take 50 mg by mouth daily.       Allergies:    Allergies  Allergen Reactions  . Penicillins Hives    Has patient had a PCN reaction causing immediate rash, facial/tongue/throat swelling, SOB or lightheadedness with hypotension: No Has patient had a PCN reaction causing severe rash involving mucus membranes or skin necrosis: Yes Has patient had a PCN reaction that required hospitalization: No Has patient had a PCN reaction occurring within the last 10 years: No If all of the above answers are "NO", then may proceed with Cephalosporin use.     Past Medical History, Surgical history, Social history, and Family History were reviewed and updated.  Review of Systems: As stated in the interim history  Physical Exam:  weight is 152 lb (68.9 kg). His oral temperature is 97.5 F (36.4 C) (abnormal). His blood pressure is 159/52 (abnormal) and his pulse is 75. His respiration is 20 and oxygen saturation is 98%.   Wt Readings from Last 3 Encounters:  04/09/17 152 lb (68.9 kg)  03/24/17 162 lb 1.9 oz (73.5 kg)  03/17/17 156 lb (70.8 kg)    Physical Exam  Constitutional: He is oriented to person, place, and time.  HENT:  Head: Normocephalic and atraumatic.  Mouth/Throat: Oropharynx is clear and moist.  Eyes: EOM are normal. Pupils are equal, round, and reactive to light.  Neck: Normal range of motion.  Cardiovascular: Normal rate, regular rhythm and normal heart sounds.  Pulmonary/Chest: Effort normal and breath sounds normal.  Abdominal: Soft. Bowel sounds are normal.  Musculoskeletal: Normal range of motion. He exhibits no edema, tenderness or deformity.  Lymphadenopathy:    He has no cervical adenopathy.  Neurological: He is alert and oriented to person, place, and time.  Skin: Skin is warm and dry. No rash noted. No erythema.  Psychiatric: He has a normal mood and affect. His behavior is normal. Judgment and thought content normal.  Vitals reviewed.    Lab Results  Component Value Date   WBC 6.1 04/09/2017   HGB 10.0 (L)  04/09/2017   HCT 30.1 (L) 04/09/2017   MCV 92 04/09/2017   PLT 152 04/09/2017   Lab Results  Component Value Date   FERRITIN 484 (H) 01/08/2017   IRON 112 01/08/2017   TIBC 224 01/08/2017   UIBC 112 (L) 01/08/2017   IRONPCTSAT 50 01/08/2017   Lab Results  Component Value Date   RBC 3.26 (L) 04/09/2017   No results found for: KPAFRELGTCHN, LAMBDASER, KAPLAMBRATIO No results found for: IGGSERUM, Regino Bellow, IGMSERUM Lab Results  Component Value Date   TOTALPROTELP 6.0 (L) 12/26/2015  ALBUMINELP 3.4 (L) 12/26/2015   A1GS 0.4 (H) 12/26/2015   A2GS 0.8 12/26/2015   BETS 0.4 12/26/2015   BETA2SER 0.3 12/26/2015   GAMS 0.8 12/26/2015   MSPIKE Not Observed 01/29/2016   SPEI SEE NOTE 12/26/2015     Chemistry      Component Value Date/Time   NA 145 04/09/2017 0905   NA 140 03/28/2016 0853   K 4.5 04/09/2017 0905   K 4.4 03/28/2016 0853   CL 105 04/09/2017 0905   CO2 27 04/09/2017 0905   CO2 19 (L) 03/28/2016 0853   BUN 33 (H) 04/09/2017 0905   BUN 34.4 (H) 03/28/2016 0853   CREATININE 1.9 (H) 04/09/2017 0905   CREATININE 1.7 (H) 03/28/2016 0853   GLU 113 07/24/2016      Component Value Date/Time   CALCIUM 9.2 04/09/2017 0905   CALCIUM 9.2 03/28/2016 0853   ALKPHOS 69 04/09/2017 0905   ALKPHOS 85 03/28/2016 0853   AST 30 04/09/2017 0905   AST 23 03/28/2016 0853   ALT 27 04/09/2017 0905   ALT 37 03/28/2016 0853   BILITOT 0.80 04/09/2017 0905   BILITOT 0.79 03/28/2016 0853      Impression and Plan: [name] is a very pleasant 83 yo caucasian gentleman with both erythropoietin deficiency anemia and iron deficiency anemia.   We will go ahead and give him a dose of Aranesp today.  It will be interesting to see what his iron studies show.  Now that he is on Eliquis, we had to be very cautious with him having any kind of bleeding.  I would like to see him back in 6 weeks.  I think we have to maintain closer follow-up right now.  [name], MD 12/13/201810:15  AM

## 2017-04-09 NOTE — Patient Instructions (Signed)

## 2017-04-14 ENCOUNTER — Ambulatory Visit: Payer: 59 | Admitting: Physician Assistant

## 2017-04-14 ENCOUNTER — Encounter: Payer: Self-pay | Admitting: Physician Assistant

## 2017-04-14 ENCOUNTER — Encounter: Payer: Self-pay | Admitting: *Deleted

## 2017-04-14 ENCOUNTER — Ambulatory Visit (INDEPENDENT_AMBULATORY_CARE_PROVIDER_SITE_OTHER): Payer: Medicare Other | Admitting: Physician Assistant

## 2017-04-14 VITALS — BP 158/50 | HR 70 | Ht 68.0 in | Wt 151.1 lb

## 2017-04-14 DIAGNOSIS — I25810 Atherosclerosis of coronary artery bypass graft(s) without angina pectoris: Secondary | ICD-10-CM

## 2017-04-14 DIAGNOSIS — I10 Essential (primary) hypertension: Secondary | ICD-10-CM

## 2017-04-14 DIAGNOSIS — H43813 Vitreous degeneration, bilateral: Secondary | ICD-10-CM | POA: Diagnosis not present

## 2017-04-14 DIAGNOSIS — I2583 Coronary atherosclerosis due to lipid rich plaque: Secondary | ICD-10-CM | POA: Diagnosis not present

## 2017-04-14 DIAGNOSIS — N183 Chronic kidney disease, stage 3 unspecified: Secondary | ICD-10-CM

## 2017-04-14 DIAGNOSIS — I480 Paroxysmal atrial fibrillation: Secondary | ICD-10-CM

## 2017-04-14 DIAGNOSIS — Z961 Presence of intraocular lens: Secondary | ICD-10-CM | POA: Diagnosis not present

## 2017-04-14 DIAGNOSIS — H52203 Unspecified astigmatism, bilateral: Secondary | ICD-10-CM | POA: Diagnosis not present

## 2017-04-14 DIAGNOSIS — E113292 Type 2 diabetes mellitus with mild nonproliferative diabetic retinopathy without macular edema, left eye: Secondary | ICD-10-CM | POA: Diagnosis not present

## 2017-04-14 DIAGNOSIS — I509 Heart failure, unspecified: Secondary | ICD-10-CM

## 2017-04-14 DIAGNOSIS — I2510 Atherosclerotic heart disease of native coronary artery without angina pectoris: Secondary | ICD-10-CM | POA: Diagnosis not present

## 2017-04-14 LAB — HM DIABETES EYE EXAM

## 2017-04-14 MED ORDER — LISINOPRIL 10 MG PO TABS: 10.0000 mg | ORAL_TABLET | Freq: Every day | ORAL | 3 refills | Status: DC

## 2017-04-14 NOTE — Progress Notes (Signed)
Cardiology Office Note    Date:  04/14/2017   ID:  [name], DOB [birth year], MRN [id number]  PCP:  [name], DO  Cardiologist:   No chief complaint on file.   History of Present Illness:  [name] is a 83 y.o. male with history of CADwith prior CABG in 1999 -LIMA to the LAD, SVG to IM, OM1, OM 2, SVG to D1, SVG to PDA/PLA, EF 45-50% with mild diffuse hypokinesis.  Had SSCP in 2011 and required cath. All of his grafts were patent.  Nuclear stress test 08/2015 scar, no ischemia LVEF 70% also has atrial fibrillation/flutter in 2014 on Eliquis status post [facility] 10/2014, PVD, claudication, von Willebrand followed by hematology.   Last saw Dr. Johnsie Cancel 10/06/16 and had presyncope was not orthostatic in the office.  Recommended avoiding diuretics for BP control.   Patient was in the ER 03/17/17 with dyspnea and leg edema.  He was given IV Lasix 40 mg and diuresed 400 cc in ER and was feeling better was sent home on Lasix 20 mg daily for 5 days.  BNP was 879.  Troponin 0 0.03 creatinine 1.52, hemoglobin 9.7.  I saw the patient 03/24/17 with acute on chronic CHF and he was in atrial flutter.  He underwent cardioversion 04/01/17.  I increased his Lasix to 40 mg for 3 days and 20 mg.  He had trouble with hyperkalemia so potassium was stopped and lisinopril.  Creatinine was also rising was checked on 12/13 and came down to 1.9.  Patient comes in today accompanied by his wife.  His breathing is much better.  He still has mild edema but he has lost 11 pounds.  He feels like the cardioversion has given him more energy.  He denies any further chest tightness.  2D echo reviewed with him shows normal LV function with diastolic dysfunction.  Is watching his salt closely.  Past Medical History:  Diagnosis Date  . A-fib ([place])   . Basal cell carcinoma    skin  . Bigeminal rhythm   . Cataract   . Chronic renal insufficiency, stage 3 (moderate) (HCC) 2018   GFR 30s-40s  . Coronary artery  disease    post bypass  . CVD (cardiovascular disease)   . Diabetes mellitus without complication ([place])   . Diverticular disease   . Easy bruising   . 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 Gout   . Hernia   . Hyperlipidemia   . Hypertension   . Iron deficiency anemia 02/04/2016  . Macular degeneration   . Microscopic colitis   . PVD (peripheral vascular disease) ([place])     Past Surgical History:  Procedure Laterality Date  . CARDIAC CATHETERIZATION  2006  . CARDIOVERSION N/A 02/22/2013   Procedure: CARDIOVERSION;  Surgeon: [name], MD;  Location[facility] ENDOSCOPY;  Service: Cardiovascular;  Laterality: N/A;  . CARDIOVERSION N/A 11/15/2014   Procedure: CARDIOVERSION;  Surgeon: [name], MD;  Location: [facility] ENDOSCOPY;  Service: Cardiovascular;  Laterality: N/A;  . CARDIOVERSION N/A 04/01/2017   Procedure: CARDIOVERSION;  Surgeon: Larey Dresser, MD;  Location: [place];  Service: Cardiovascular;  Laterality: N/A;  . CAROTID ENDARTERECTOMY  2009/ 1993   left/ right  . CORONARY ARTERY BYPASS GRAFT  1999  . EYE SURGERY     eyelid repair  . INGUINAL HERNIA REPAIR  02/11/2012   Procedure: LAPAROSCOPIC BILATERAL INGUINAL HERNIA REPAIR;  Surgeon: Pedro Earls, MD;  Location: [facility] ORS;  Service:  General;  Laterality: Bilateral;  . SKIN CANCER EXCISION     right ear x 3    Current Medications: Current Meds  Medication Sig  . allopurinol (ZYLOPRIM) 300 MG tablet Take 0.5 tablets (150 mg total) by mouth daily.  . Aloe Vera 25 MG CAPS Take 50 mg by mouth daily.   Marland Kitchen amLODipine (NORVASC) 2.5 MG tablet Take 1 tablet (2.5 mg total) by mouth 2 (two) times daily.  Marland Kitchen aspirin 81 MG tablet Take 81 mg by mouth at bedtime.   Marland Kitchen atorvastatin (LIPITOR) 20 MG tablet Take 1 tablet (20 mg total) by mouth at bedtime.  . Blood Pressure Monitoring (BLOOD PRESSURE MONITOR/M CUFF) MISC 1 Units by Does not apply route daily.  . carvedilol (COREG) 6.25 MG tablet Take 1 tablet (6.25 mg  total) by mouth 2 (two) times daily.  . cholecalciferol (VITAMIN D) 1000 units tablet Take 1,000 Units by mouth daily.   . CHROMIUM GTF PO Take 2 capsules by mouth daily.  . Cinnamon 500 MG TABS Take 2 tablets by mouth at bedtime.   . Coenzyme Q10 (CO Q 10) 100 MG CAPS Take 100 mg by mouth daily.   Marland Kitchen dicyclomine (BENTYL) 10 MG capsule Take 1 capsule (10 mg total) by mouth 2 (two) times daily.  Marland Kitchen ELIQUIS 2.5 MG TABS tablet TAKE 1 TABLET TWICE A DAY  . fish oil-omega-3 fatty acids 1000 MG capsule Take 1 g by mouth daily.   . furosemide (LASIX) 20 MG tablet Take 1 tablet (20 mg total) by mouth as directed. Take 2 tabs daily for 3 days then change to 1 tab daily  . glucose blood (PRODIGY NO CODING BLOOD GLUC) test strip Use as instructed once daily  . KLOR-CON 10 10 MEQ tablet TAKE 2 TABLETS BY MOUTH DAILY FOR 3 DAYS THEN CHANGE TO 1 TAB DAILY  . Multiple Vitamins-Minerals (PRESERVISION AREDS PO) Take 1 tablet by mouth 2 (two) times daily.   Glory Rosebush DELICA LANCETS 22Q MISC CHECK FASTING GLUCOSE ONCE DAILY  . Probiotic Product (PROBIOTIC PO) Take 1 tablet by mouth daily.  . tamsulosin (FLOMAX) 0.4 MG CAPS capsule TAKE 1 CAPSULE BY MOUTH ONCE DAILY AFTER SUPPER  . VANADYL SULFATE PO Take 1 capsule by mouth daily.  Marland Kitchen zinc gluconate 50 MG tablet Take 50 mg by mouth daily.      Allergies:   Penicillins   Social History   Socioeconomic History  . Marital status: Married    Spouse name: None  . Number of children: None  . Years of education: None  . Highest education level: None  Social Needs  . Financial resource strain: None  . Food insecurity - worry: None  . Food insecurity - inability: None  . Transportation needs - medical: None  . Transportation needs - non-medical: None  Occupational History  . Occupation: retired  Tobacco Use  . Smoking status: Former Smoker    Last attempt to quit: 12/25/1961    Years since quitting: 55.3  . Smokeless tobacco: Former Systems developer    Types: [place] date: 02/03/1969  Substance and Sexual Activity  . Alcohol use: No    Alcohol/week: 0.0 oz  . Drug use: No  . Sexual activity: No  Other Topics Concern  . None  Social History Narrative   Married to [place], 2 children [name] and [name].   Some college. Retired from [employer].   Drinks caffeine, uses herbal remedies, takes a daily vitamin.  Wears his seatbelt, smoke detector at home, firearms in the home.   Wears a hearing aid.   Feels safe in her relationships.     Family History:  The patient's family history includes Coronary artery disease in his father; Heart disease in his father; Melanoma in his sister; Stroke in his mother.   ROS:   Please see the history of present illness.    Review of Systems  Constitution: Negative.  HENT: Negative.   Cardiovascular: Positive for leg swelling.  Respiratory: Negative.   Endocrine: Negative.   Hematologic/Lymphatic: Negative.   Musculoskeletal: Negative.   Gastrointestinal: Negative.   Genitourinary: Negative.   Neurological: Negative.    All other systems reviewed and are negative.   PHYSICAL EXAM:   VS:  BP (!) 158/50   Pulse 70   Ht 5\' 8"  (1.727 m)   Wt 151 lb 1.9 oz (68.5 kg)   SpO2 96%   BMI 22.98 kg/m   Physical Exam  GEN: Well nourished, well developed, in no acute distress  Neck: no JVD, carotid bruits, or masses Cardiac:RRR; no murmurs, rubs, or gallops  Respiratory:  clear to auscultation bilaterally, normal work of breathing GI: soft, nontender, nondistended, + BS Ext: Trace of edema on the left +1 on the right without cyanosis, clubbing, Good distal pulses bilaterally Neuro:  Alert and Oriented x 3, Psych: euthymic mood, full affect  Wt Readings from Last 3 Encounters:  04/14/17 151 lb 1.9 oz (68.5 kg)  04/09/17 152 lb (68.9 kg)  03/24/17 162 lb 1.9 oz (73.5 kg)      Studies/Labs Reviewed:   EKG:  EKG is  ordered today.  The ekg ordered today demonstrates normal sinus rhythm at 58  bpm  Recent Labs: 12/04/2016: Magnesium 1.5; TSH 3.54 03/17/2017: B Natriuretic Peptide 879.1 03/24/2017: NT-Pro BNP 4,003 04/09/2017: ALT(SGPT) 27; BUN, Bld 33; Creat 1.9; HGB 10.0; Platelets 152; Potassium 4.5; Sodium 145   Lipid Panel    Component Value Date/Time   CHOL 112 06/09/2013 0910   TRIG 69.0 06/09/2013 0910   HDL 43.20 06/09/2013 0910   CHOLHDL 3 06/09/2013 0910   VLDL 13.8 06/09/2013 0910   LDLCALC 55 06/09/2013 0910   LDLDIRECT 49 01/15/2017 1031    Additional studies/ records that were reviewed today include:  2D echo 04/03/17 study Conclusions   - Left ventricle: The cavity size was normal. There was mild   concentric hypertrophy. Systolic function was normal. The   estimated ejection fraction was in the range of 55% to 60%. Wall   motion was normal; there were no regional wall motion   abnormalities. Features are consistent with a pseudonormal left   ventricular filling pattern, with concomitant abnormal relaxation   and increased filling pressure (grade 2 diastolic dysfunction). - Ventricular septum: Septal motion showed paradox. These changes   are consistent with a post-thoracotomy state. - Mitral valve: There was mild to moderate regurgitation directed   centrally. Diastolic regurgitation was present. - Left atrium: The atrium was moderately dilated. - Right atrium: The atrium was mildly dilated. - Pulmonary arteries: PA peak pressure: 32 mm Hg (S).      ASSESSMENT:    1. CHF (congestive heart failure), NYHA class I, unspecified failure chronicity, unspecified type ([place])   2. Essential hypertension   3. Paroxysmal atrial fibrillation (HCC)   4. Atherosclerosis of coronary artery bypass graft of native heart without angina pectoris   5. Stage 3 chronic kidney disease ([place])      PLAN:  In order of problems listed above:  CHF chronic diastolic recent 2D echo grade 2 DD with normal LV function.  I think it was exacerbated by atrial fib/flutter.  He  is doing better since cardioversion.  Continue low-dose Lasix 20 mg daily.  He is lost 11 pounds and is watching his sodium intake.  Follow-up with Dr. Johnsie Cancel with lab work in January.  Essential hypertension patient's blood pressure is running on the high side since lisinopril has been stopped because of high potassium and renal insufficiency.  He recently saw the renal doctors and they felt he was stable to continue lisinopril and lower his potassium in his diet.  Will restart lisinopril 10 mg once daily.  Follow-up renal function in 2 weeks.  PAF status post cardioversion and maintaining normal sinus rhythm.  On low-dose Coreg cannot increase because of bradycardia.  Continue Eliquis.  Stage III kidney disease followed by renal docs and stable.  Will monitor since restarting lisinopril low-dose.    Medication Adjustments/Labs and Tests Ordered: Current medicines are reviewed at length with the patient today.  Concerns regarding medicines are outlined above.  Medication changes, Labs and Tests ordered today are listed in the Patient Instructions below. Patient Instructions  Medication Instructions:  START LISINOPRIL 10 MG DAILY  Labwork: BMET TO BE DONE 05/05/17 WHEN YOU SEE DR. NISHAN  Testing/Procedures: NONE ORDERED   Follow-Up: KEEP YOUR APPT WITH DR. Johnsie Cancel 05/05/17  Any Other Special Instructions Will Be Listed Below (If Applicable).     If you need a refill on your cardiac medications before your next appointment, please call your pharmacy.     Sumner Boast, PA-C  04/14/2017 12:42 PM    [facility] [address], [place], [place]  [address] Phone: [phone]; Fax: [phone]

## 2017-04-14 NOTE — Patient Instructions (Signed)
Medication Instructions:  START LISINOPRIL 10 MG DAILY  Labwork: BMET TO BE DONE 05/05/17 WHEN YOU SEE DR. NISHAN  Testing/Procedures: NONE ORDERED   Follow-Up: KEEP YOUR APPT WITH DR. Johnsie Cancel 05/05/17  Any Other Special Instructions Will Be Listed Below (If Applicable).     If you need a refill on your cardiac medications before your next appointment, please call your pharmacy.

## 2017-04-16 DIAGNOSIS — L84 Corns and callosities: Secondary | ICD-10-CM | POA: Diagnosis not present

## 2017-04-16 DIAGNOSIS — L603 Nail dystrophy: Secondary | ICD-10-CM | POA: Diagnosis not present

## 2017-04-16 DIAGNOSIS — I739 Peripheral vascular disease, unspecified: Secondary | ICD-10-CM | POA: Diagnosis not present

## 2017-04-16 DIAGNOSIS — E1151 Type 2 diabetes mellitus with diabetic peripheral angiopathy without gangrene: Secondary | ICD-10-CM | POA: Diagnosis not present

## 2017-04-27 ENCOUNTER — Telehealth: Payer: Self-pay | Admitting: Cardiovascular Disease

## 2017-04-27 MED ORDER — CARVEDILOL 6.25 MG PO TABS: 6.2500 mg | ORAL_TABLET | Freq: Two times a day (BID) | ORAL | 3 refills | Status: DC

## 2017-04-27 NOTE — Telephone Encounter (Signed)
New Message   *STAT* If patient is at the pharmacy, call can be transferred to refill team.   1. Which medications need to be refilled? (please list name of each medication and dose if known) Carvedilol 6.25mg   2. Which pharmacy/location (including street and city if local pharmacy) is medication to be sent to?[facility]  3. Do they need a 30 day or 90 day supply? [place]

## 2017-04-27 NOTE — Progress Notes (Signed)
Cardiology Office Note    Date:  05/05/2017   ID:  [name], DOB 09-26-1928, MRN [id number]  PCP:  [name], DO  Cardiologist: Andrez Grime chief complaint on file.   History of Present Illness:  [name] is a 83 y.o. male with history of CADwith prior CABG in 1999 -LIMA to the LAD, SVG to IM, OM1, OM 2, SVG to D1, SVG to PDA/PLA, EF 45-50% with mild diffuse hypokinesis. Grafts patent by cath 2011   Nuclear stress test 08/2015 scar, no ischemia LVEF 70% also has atrial fibrillation/flutter in 2014 on Eliquis status post [facility] 10/2014, PVD, claudication, von Willebrand followed by hematology.   June 2018 had some postural symptoms and diuretic held .   Patient was in the ER 03/17/17 with dyspnea and leg edema.  He was given IV Lasix 40 mg and diuresed 400 cc in ER and was feeling better was sent home on Lasix 20 mg daily for 5 days.  BNP was 879.  Troponin 0 0.03 creatinine 1.52, hemoglobin 9.7.  Seen by PA  03/24/17 with acute on chronic CHF and he was in atrial flutter.  He underwent cardioversion 04/01/17.  I increased his Lasix to 40 mg for 3 days and 20 mg.  He had trouble with hyperkalemia so potassium was stopped and lisinopril.  Creatinine was also rising was checked on 12/13 and came down to 1.9.  Echo 04/03/17 EF 32-44%  Grade 2 diastolic  Mild to moderate MR Moderate LAE mild RAE   Doing better a bit upset about not having me do [facility] and having to go to ER for elevated K then being Told he didn't need to be there   Past Medical History:  Diagnosis Date  . A-fib ([name])   . Basal cell carcinoma    skin  . Bigeminal rhythm   . Cataract   . Chronic renal insufficiency, stage 3 (moderate) (HCC) 2018   GFR 30s-40s  . Coronary artery disease    post bypass  . CVD (cardiovascular disease)   . Diabetes mellitus without complication ([place])   . Diverticular disease   . Easy bruising   . 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 Gout   . Hernia   .  Hyperlipidemia   . Hypertension   . Iron deficiency anemia 02/04/2016  . Macular degeneration   . Microscopic colitis   . PVD (peripheral vascular disease) ([place])     Past Surgical History:  Procedure Laterality Date  . CARDIAC CATHETERIZATION  2006  . CARDIOVERSION N/A 02/22/2013   Procedure: CARDIOVERSION;  Surgeon: [name], MD;  Location: [facility] ENDOSCOPY;  Service: Cardiovascular;  Laterality: N/A;  . CARDIOVERSION N/A 11/15/2014   Procedure: CARDIOVERSION;  Surgeon: [name], MD;  Location: [facility] ENDOSCOPY;  Service: Cardiovascular;  Laterality: N/A;  . CARDIOVERSION N/A 04/01/2017   Procedure: CARDIOVERSION;  Surgeon: Larey Dresser, MD;  Location: [place];  Service: Cardiovascular;  Laterality: N/A;  . CAROTID ENDARTERECTOMY  2009/ 1993   left/ right  . CORONARY ARTERY BYPASS GRAFT  1999  . EYE SURGERY     eyelid repair  . INGUINAL HERNIA REPAIR  02/11/2012   Procedure: LAPAROSCOPIC BILATERAL INGUINAL HERNIA REPAIR;  Surgeon: Pedro Earls, MD;  Location: [facility] ORS;  Service: General;  Laterality: Bilateral;  . SKIN CANCER EXCISION     right ear x 3    Current Medications: Current Meds  Medication Sig  . ACCU-CHEK FASTCLIX LANCETS MISC  Check blood glucose once daily  . allopurinol (ZYLOPRIM) 300 MG tablet Take 0.5 tablets (150 mg total) by mouth daily.  . Aloe Vera 25 MG CAPS Take 50 mg by mouth daily.   Marland Kitchen amLODipine (NORVASC) 2.5 MG tablet Take 1 tablet (2.5 mg total) by mouth 2 (two) times daily.  Marland Kitchen aspirin 81 MG tablet Take 81 mg by mouth at bedtime.   Marland Kitchen atorvastatin (LIPITOR) 20 MG tablet Take 1 tablet (20 mg total) by mouth at bedtime.  . Blood Pressure Monitoring (BLOOD PRESSURE MONITOR/M CUFF) MISC 1 Units by Does not apply route daily.  . carvedilol (COREG) 6.25 MG tablet Take 1 tablet (6.25 mg total) by mouth 2 (two) times daily.  . cholecalciferol (VITAMIN D) 1000 units tablet Take 1,000 Units by mouth daily.   . CHROMIUM GTF PO Take 2 capsules by  mouth daily.  . Cinnamon 500 MG TABS Take 2 tablets by mouth at bedtime.   . Coenzyme Q10 (CO Q 10) 100 MG CAPS Take 100 mg by mouth daily.   Marland Kitchen dicyclomine (BENTYL) 10 MG capsule Take 1 capsule (10 mg total) by mouth 2 (two) times daily.  Marland Kitchen ELIQUIS 2.5 MG TABS tablet TAKE 1 TABLET TWICE A DAY  . fish oil-omega-3 fatty acids 1000 MG capsule Take 1 g by mouth daily.   . furosemide (LASIX) 20 MG tablet Take 1 tablet (20 mg total) by mouth as directed. Take 2 tabs daily for 3 days then change to 1 tab daily  . glucose blood (ACCU-CHEK GUIDE) test strip Use as instructed to check blood glucose once daily  . glucose blood (PRODIGY NO CODING BLOOD GLUC) test strip Use as instructed once daily  . KLOR-CON 10 10 MEQ tablet TAKE 2 TABLETS BY MOUTH DAILY FOR 3 DAYS THEN CHANGE TO 1 TAB DAILY  . lisinopril (PRINIVIL,ZESTRIL) 10 MG tablet Take 1 tablet (10 mg total) by mouth daily.  . Multiple Vitamins-Minerals (PRESERVISION AREDS PO) Take 1 tablet by mouth 2 (two) times daily.   Glory Rosebush DELICA LANCETS 84O MISC CHECK FASTING GLUCOSE ONCE DAILY  . Probiotic Product (PROBIOTIC PO) Take 1 tablet by mouth daily.  . tamsulosin (FLOMAX) 0.4 MG CAPS capsule TAKE 1 CAPSULE BY MOUTH ONCE DAILY AFTER SUPPER  . VANADYL SULFATE PO Take 1 capsule by mouth daily.  Marland Kitchen zinc gluconate 50 MG tablet Take 50 mg by mouth daily.      Allergies:   Penicillins   Social History   Socioeconomic History  . Marital status: Married    Spouse name: None  . Number of children: None  . Years of education: None  . Highest education level: None  Social Needs  . Financial resource strain: None  . Food insecurity - worry: None  . Food insecurity - inability: None  . Transportation needs - medical: None  . Transportation needs - non-medical: None  Occupational History  . Occupation: retired  Tobacco Use  . Smoking status: Former Smoker    Last attempt to quit: 12/25/1961    Years since quitting: 55.3  . Smokeless tobacco:  Former Systems developer    Types: [place] date: 02/03/1969  Substance and Sexual Activity  . Alcohol use: No    Alcohol/week: 0.0 oz  . Drug use: No  . Sexual activity: No  Other Topics Concern  . None  Social History Narrative   Married to [place], 2 children [name] and [name].   Some college. Retired from [employer].  Drinks caffeine, uses herbal remedies, takes a daily vitamin.   Wears his seatbelt, smoke detector at home, firearms in the home.   Wears a hearing aid.   Feels safe in her relationships.     Family History:  The patient's family history includes Coronary artery disease in his father; Heart disease in his father; Melanoma in his sister; Stroke in his mother.   ROS:   Please see the history of present illness.    Review of Systems  Constitution: Negative.  HENT: Negative.   Cardiovascular: Positive for leg swelling.  Respiratory: Negative.   Endocrine: Negative.   Hematologic/Lymphatic: Negative.   Musculoskeletal: Negative.   Gastrointestinal: Negative.   Genitourinary: Negative.   Neurological: Negative.    All other systems reviewed and are negative.   PHYSICAL EXAM:   VS:  BP (!) 148/78   Pulse 68   Ht 5\' 8"  (1.727 m)   Wt 151 lb 12 oz (68.8 kg)   SpO2 99%   BMI 23.07 kg/m   Physical Exam  Affect appropriate Elderly white male  HEENT: normal Neck supple with no adenopathy JVP normal no bruits no thyromegaly Lungs clear with no wheezing and good diaphragmatic motion Heart:  S1/S2 no murmur, no rub, gallop or click PMI normal Abdomen: benighn, BS positve, no tenderness, no AAA no bruit.  No HSM or HJR Distal pulses intact with no bruits Plus one bilateral  edema Neuro non-focal Skin warm and dry No muscular weakness   Wt Readings from Last 3 Encounters:  05/05/17 151 lb 12 oz (68.8 kg)  04/14/17 151 lb 1.9 oz (68.5 kg)  04/09/17 152 lb (68.9 kg)      Studies/Labs Reviewed:   EKG:  EKG is  ordered today.  The ekg ordered today  demonstrates normal sinus rhythm at 58 bpm  Recent Labs: 12/04/2016: Magnesium 1.5; TSH 3.54 03/17/2017: B Natriuretic Peptide 879.1 03/24/2017: NT-Pro BNP 4,003 04/09/2017: ALT(SGPT) 27; BUN, Bld 33; Creat 1.9; HGB 10.0; Platelets 152; Potassium 4.5; Sodium 145   Lab Results  Component Value Date   LDLCALC 55 06/09/2013       Plan:  1. Diastolic Dysfunction: despite CRF needs some diuretic continue lasix 20 mg daily 2. PAF: on low dose eliquis due to CRF and age no bleeding issues Maintaining NSR  3. HTN:  Well controlled.  Continue current medications and low sodium Dash type diet.   4. CAD/CABG:  No chest pain low risk myovue 2017 continue medical Rx 5. Cholesterol:  On statin LDL 49 with normal LFTls September 2018   Will have CBC/PLT and BMET today    F/U with me in 3 months   Jenkins Rouge

## 2017-04-27 NOTE — Telephone Encounter (Signed)
Pt's medication was sent to pt's pharmacy as requested. Confirmation received.  °

## 2017-05-04 ENCOUNTER — Other Ambulatory Visit: Payer: Self-pay | Admitting: *Deleted

## 2017-05-04 MED ORDER — GLUCOSE BLOOD VI STRP: ORAL_STRIP | 3 refills | Status: DC

## 2017-05-04 MED ORDER — ACCU-CHEK FASTCLIX LANCETS MISC: 3 refills | Status: DC

## 2017-05-05 ENCOUNTER — Ambulatory Visit (INDEPENDENT_AMBULATORY_CARE_PROVIDER_SITE_OTHER): Payer: Medicare Other | Admitting: Cardiovascular Disease

## 2017-05-05 ENCOUNTER — Other Ambulatory Visit: Payer: Medicare Other

## 2017-05-05 ENCOUNTER — Encounter: Payer: Self-pay | Admitting: Cardiovascular Disease

## 2017-05-05 VITALS — BP 148/78 | HR 68 | Ht 68.0 in | Wt 151.8 lb

## 2017-05-05 DIAGNOSIS — I10 Essential (primary) hypertension: Secondary | ICD-10-CM

## 2017-05-05 DIAGNOSIS — I209 Angina pectoris, unspecified: Secondary | ICD-10-CM | POA: Diagnosis not present

## 2017-05-05 DIAGNOSIS — I480 Paroxysmal atrial fibrillation: Secondary | ICD-10-CM | POA: Diagnosis not present

## 2017-05-05 DIAGNOSIS — I509 Heart failure, unspecified: Secondary | ICD-10-CM | POA: Diagnosis not present

## 2017-05-05 DIAGNOSIS — I25708 Atherosclerosis of coronary artery bypass graft(s), unspecified, with other forms of angina pectoris: Secondary | ICD-10-CM | POA: Diagnosis not present

## 2017-05-05 DIAGNOSIS — I2510 Atherosclerotic heart disease of native coronary artery without angina pectoris: Secondary | ICD-10-CM | POA: Diagnosis not present

## 2017-05-05 DIAGNOSIS — I2583 Coronary atherosclerosis due to lipid rich plaque: Secondary | ICD-10-CM | POA: Diagnosis not present

## 2017-05-05 LAB — CBC WITH DIFFERENTIAL/PLATELET
BASOS: 0 %
Basophils Absolute: 0 10*3/uL (ref 0.0–0.2)
EOS (ABSOLUTE): 0.1 10*3/uL (ref 0.0–0.4)
Eos: 3 %
HEMATOCRIT: 39.3 % (ref 37.5–51.0)
Hemoglobin: 12.9 g/dL — ABNORMAL LOW (ref 13.0–17.7)
IMMATURE GRANS (ABS): 0 10*3/uL (ref 0.0–0.1)
Immature Granulocytes: 0 %
Lymphocytes Absolute: 1.7 10*3/uL (ref 0.7–3.1)
Lymphs: 32 %
MCH: 29.6 pg (ref 26.6–33.0)
MCHC: 32.8 g/dL (ref 31.5–35.7)
MCV: 90 fL (ref 79–97)
MONOS ABS: 0.4 10*3/uL (ref 0.1–0.9)
Monocytes: 8 %
NEUTROS ABS: 3 10*3/uL (ref 1.4–7.0)
Neutrophils: 57 %
PLATELETS: 182 10*3/uL (ref 150–379)
RBC: 4.36 x10E6/uL (ref 4.14–5.80)
RDW: 15.6 % — AB (ref 12.3–15.4)
WBC: 5.2 10*3/uL (ref 3.4–10.8)

## 2017-05-05 LAB — BASIC METABOLIC PANEL
BUN/Creatinine Ratio: 22 (ref 10–24)
BUN: 33 mg/dL — ABNORMAL HIGH (ref 8–27)
CO2: 23 mmol/L (ref 20–29)
Calcium: 9.4 mg/dL (ref 8.6–10.2)
Chloride: 105 mmol/L (ref 96–106)
Creatinine, Ser: 1.47 mg/dL — ABNORMAL HIGH (ref 0.76–1.27)
GFR, EST AFRICAN AMERICAN: 49 mL/min/{1.73_m2} — AB (ref >59)
GFR, EST NON AFRICAN AMERICAN: 43 mL/min/{1.73_m2} — AB (ref >59)
Glucose: 108 mg/dL — ABNORMAL HIGH (ref 65–99)
Potassium: 5.4 mmol/L — ABNORMAL HIGH (ref 3.5–5.2)
SODIUM: 142 mmol/L (ref 134–144)

## 2017-05-05 NOTE — Patient Instructions (Addendum)
Medication Instructions:  Your physician recommends that you continue on your current medications as directed. Please refer to the Current Medication list given to you today.  Labwork: Your physician recommends that you have lab work today- BMET and CBC   Testing/Procedures: NONE  Follow-Up: Your physician wants you to follow-up in: 3 months with Dr. Johnsie Cancel.    If you need a refill on your cardiac medications before your next appointment, please call your pharmacy.

## 2017-05-06 ENCOUNTER — Ambulatory Visit (INDEPENDENT_AMBULATORY_CARE_PROVIDER_SITE_OTHER): Payer: Medicare Other | Admitting: Family Medicine

## 2017-05-06 ENCOUNTER — Telehealth: Payer: Self-pay | Admitting: Cardiovascular Disease

## 2017-05-06 ENCOUNTER — Telehealth: Payer: Self-pay | Admitting: Family Medicine

## 2017-05-06 ENCOUNTER — Encounter: Payer: Self-pay | Admitting: Family Medicine

## 2017-05-06 VITALS — BP 146/62 | HR 57 | Temp 97.7°F

## 2017-05-06 DIAGNOSIS — E7800 Pure hypercholesterolemia, unspecified: Secondary | ICD-10-CM | POA: Diagnosis not present

## 2017-05-06 DIAGNOSIS — I509 Heart failure, unspecified: Secondary | ICD-10-CM | POA: Diagnosis not present

## 2017-05-06 DIAGNOSIS — E875 Hyperkalemia: Secondary | ICD-10-CM

## 2017-05-06 DIAGNOSIS — I10 Essential (primary) hypertension: Secondary | ICD-10-CM

## 2017-05-06 DIAGNOSIS — I480 Paroxysmal atrial fibrillation: Secondary | ICD-10-CM

## 2017-05-06 DIAGNOSIS — N183 Chronic kidney disease, stage 3 unspecified: Secondary | ICD-10-CM

## 2017-05-06 DIAGNOSIS — I25810 Atherosclerosis of coronary artery bypass graft(s) without angina pectoris: Secondary | ICD-10-CM | POA: Diagnosis not present

## 2017-05-06 DIAGNOSIS — K580 Irritable bowel syndrome with diarrhea: Secondary | ICD-10-CM

## 2017-05-06 HISTORY — DX: Hyperkalemia: E87.5

## 2017-05-06 MED ORDER — AMLODIPINE BESYLATE 2.5 MG PO TABS: 2.5000 mg | ORAL_TABLET | Freq: Two times a day (BID) | ORAL | 1 refills | Status: DC

## 2017-05-06 MED ORDER — DICYCLOMINE HCL 10 MG PO CAPS
10.0000 mg | ORAL_CAPSULE | Freq: Two times a day (BID) | ORAL | 3 refills | Status: DC
Start: 2017-05-06 — End: 2017-08-12

## 2017-05-06 NOTE — Telephone Encounter (Signed)
°  F/u Message  Pt returning RN call about labs.

## 2017-05-06 NOTE — Telephone Encounter (Signed)
Patient aware of lab results. Per Dr. Alvan Dame, [facility] to stop K supplement repeat labs in 3 weeks.  Patient will come in form lab work in 3 weeks on 05/27/17.  Patient stated he is not taking any K supplement. Encouaraged patient to reduce his potassium intake in his diet. Patient verbalized understanding.

## 2017-05-06 NOTE — Telephone Encounter (Signed)
Copied from [place] [id number]. Topic: General - Other >> May 06, 2017 11:55 AM Lolita Rieger, RMA wrote: Reason for CRM: Pt called and stated that his new meter is Accu-chek per physician request for info

## 2017-05-06 NOTE — Progress Notes (Signed)
[name] , Sep 02, 1928, 84 y.o., male MRN: [id number] Patient Care Team    Relationship Specialty Notifications Start End  [name], DO PCP - General Family Medicine  12/18/15   [name], MD PCP - Cardiology Cardiology  03/17/17   [name], MD Consulting Physician Ophthalmology  12/18/15   [name], MD Consulting Physician Dermatology  12/18/15   [name], MD Consulting Physician Cardiology  12/18/15   [name], MD Consulting Physician Ophthalmology  12/19/15   [name], MD Consulting Physician Oncology  09/30/16   [name], MD Attending Physician Nephrology  09/30/16   [name], MD Consulting Physician Dermatology  09/30/16     Chief Complaint  Patient presents with  . Follow-up    pts is here for his 3 months follow up for high blood pressure    Subjective:  Hypertension/CKD3/A.Fib:  Patient reports compliance with lisinopril 10 mg daily, Coreg 6.25 twice a day and amlodipine 2.5 mg twice a day (pt preference). New addition of Lasix 20 mg daily. Patient denies chest pain, shortness of breath, dizziness or lower extremity edema. Patient reports compliance with Eliquis 2.5 mg twice a day. He takes fish oil supplementation of 1000 mg daily. Lipitor 20 mg daily. Patient was hospitalized Patient recently underwent cardioversion approximately 4 weeks ago. He reports he is feeling well today and is getting back to his exercise and tolerating well. BMP: 05/05/2017, creatinine 1.47. Potassium 5.4, patient was aware of reading and is watching the potassium in his diet. He has not been taking the potassium supplement. CBC: 05/05/2017 stable, with hemoglobin 12.9/hematocrit 39.3 iron deficiency anemia and anemia of chronic disease. Lipid: 01/15/2017, direct LDL 49 PTH/calcium/vitamin D: 01/15/2017 within normal limits Diet: Low sodium diet followed Exercise: very active RF: CKD3, HLD, CAD w/ CABG (1999), cardiac cath 2011 2/2 CP (all  grafts normal), PVD, Afib Significant cardiac history of CAD with prior CABG in 1999 -LIMA to the LAD, SVG to IM, OM1, OM 2, SVG to D1, SVG to PDA/PLA, EF 45-50% with mild diffuse hypokinesis. Grafts patent by cath 2011 Nuclear stress test 08/2015 scar, no ischemia LVEF 70% also has atrial fibrillation/flutter in 2014 on Eliquis status post [facility] 10/2014, PVD, claudication.  Echo 04/03/17 EF 28-36%  Grade 2 diastolic  Mild to moderate MR Moderate LAE mild RAE   Allergies  Allergen Reactions  . Penicillins Hives    Has patient had a PCN reaction causing immediate rash, facial/tongue/throat swelling, SOB or lightheadedness with hypotension: No Has patient had a PCN reaction causing severe rash involving mucus membranes or skin necrosis: Yes Has patient had a PCN reaction that required hospitalization: No Has patient had a PCN reaction occurring within the last 10 years: No If all of the above answers are "NO", then may proceed with Cephalosporin use.    Social History   Tobacco Use  . Smoking status: Former Smoker    Last attempt to quit: 12/25/1961    Years since quitting: 55.4  . Smokeless tobacco: Former Systems developer    Types: Chew    Quit date: 02/03/1969  Substance Use Topics  . Alcohol use: No    Alcohol/week: 0.0 oz   Past Medical History:  Diagnosis Date  . A-fib ([place])   . Basal cell carcinoma    skin  . Bigeminal rhythm   . Cataract   . Chronic renal insufficiency, stage 3 (moderate) (HCC) 2018   GFR 30s-40s  . Coronary artery disease  post bypass  . CVD (cardiovascular disease)   . Diabetes mellitus without complication ([place])   . Diverticular disease   . Easy bruising   . 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 Gout   . Hernia   . Hyperlipidemia   . Hypertension   . Iron deficiency anemia 02/04/2016  . Macular degeneration   . Microscopic colitis   . PVD (peripheral vascular disease) ([place])    Past Surgical History:  Procedure Laterality Date  . CARDIAC  CATHETERIZATION  2006  . CARDIOVERSION N/A 02/22/2013   Procedure: CARDIOVERSION;  Surgeon: [name], MD;  Location: [facility] ENDOSCOPY;  Service: Cardiovascular;  Laterality: N/A;  . CARDIOVERSION N/A 11/15/2014   Procedure: CARDIOVERSION;  Surgeon: [name], MD;  Location: [facility] ENDOSCOPY;  Service: Cardiovascular;  Laterality: N/A;  . CARDIOVERSION N/A 04/01/2017   Procedure: CARDIOVERSION;  Surgeon: Larey Dresser, MD;  Location: [place];  Service: Cardiovascular;  Laterality: N/A;  . CAROTID ENDARTERECTOMY  2009/ 1993   left/ right  . CORONARY ARTERY BYPASS GRAFT  1999  . EYE SURGERY     eyelid repair  . INGUINAL HERNIA REPAIR  02/11/2012   Procedure: LAPAROSCOPIC BILATERAL INGUINAL HERNIA REPAIR;  Surgeon: Pedro Earls, MD;  Location: [facility] ORS;  Service: General;  Laterality: Bilateral;  . SKIN CANCER EXCISION     right ear x 3   Family History  Problem Relation Age of Onset  . Stroke Mother   . Coronary artery disease Father   . Heart disease Father   . Melanoma Sister    Allergies as of 05/06/2017      Reactions   Penicillins Hives   Has patient had a PCN reaction causing immediate rash, facial/tongue/throat swelling, SOB or lightheadedness with hypotension: No Has patient had a PCN reaction causing severe rash involving mucus membranes or skin necrosis: Yes Has patient had a PCN reaction that required hospitalization: No Has patient had a PCN reaction occurring within the last 10 years: No If all of the above answers are "NO", then may proceed with Cephalosporin use.      Medication List        Accurate as of 05/06/17  3:53 PM. Always use your most recent med list.          allopurinol 300 MG tablet Commonly known as:  ZYLOPRIM Take 0.5 tablets (150 mg total) by mouth daily.   Aloe Vera 25 MG Caps Take 50 mg by mouth daily.   amLODipine 2.5 MG tablet Commonly known as:  NORVASC Take 1 tablet (2.5 mg total) by mouth 2 (two) times daily.   aspirin 81  MG tablet Take 81 mg by mouth at bedtime.   atorvastatin 20 MG tablet Commonly known as:  LIPITOR Take 1 tablet (20 mg total) by mouth at bedtime.   carvedilol 6.25 MG tablet Commonly known as:  COREG Take 1 tablet (6.25 mg total) by mouth 2 (two) times daily.   cholecalciferol 1000 units tablet Commonly known as:  VITAMIN D Take 1,000 Units by mouth daily.   CHROMIUM GTF PO Take 2 capsules by mouth daily.   Cinnamon 500 MG Tabs Take 2 tablets by mouth at bedtime.   Co Q 10 100 MG Caps Take 100 mg by mouth daily.   dicyclomine 10 MG capsule Commonly known as:  BENTYL Take 1 capsule (10 mg total) by mouth 2 (two) times daily.   ELIQUIS 2.5 MG Tabs tablet Generic drug:  apixaban TAKE 1 TABLET TWICE  A DAY   fish oil-omega-3 fatty acids 1000 MG capsule Take 1 g by mouth daily.   furosemide 20 MG tablet Commonly known as:  LASIX Take 1 tablet (20 mg total) by mouth as directed. Take 2 tabs daily for 3 days then change to 1 tab daily   lisinopril 10 MG tablet Commonly known as:  PRINIVIL,ZESTRIL Take 1 tablet (10 mg total) by mouth daily.   ONETOUCH DELICA LANCETS 53Z Misc CHECK FASTING GLUCOSE ONCE DAILY   PRESERVISION AREDS PO Take 1 tablet by mouth 2 (two) times daily.   PROBIOTIC PO Take 1 tablet by mouth daily.   tamsulosin 0.4 MG Caps capsule Commonly known as:  FLOMAX TAKE 1 CAPSULE BY MOUTH ONCE DAILY AFTER SUPPER   VANADYL SULFATE PO Take 1 capsule by mouth daily.   zinc gluconate 50 MG tablet Take 50 mg by mouth daily.       No results found for this or any previous visit (from the past 24 hour(s)). No results found.   ROS: Negative, with the exception of above mentioned in HPI   Objective:  BP (!) 146/62 (BP Location: Right Arm, Patient Position: Sitting, Cuff Size: Large)   Pulse (!) 57   Temp 97.7 F (36.5 C) (Oral)   SpO2 98%  There is no height or weight on file to calculate BMI. Gen: Afebrile. No acute distress. Nontoxic in  appearance, well-developed, well-nourished, Caucasian male. Very pleasant. HENT: AT. .  MMM.  Eyes:Pupils Equal Round Reactive to light, Extraocular movements intact,  Conjunctiva without redness, discharge or icterus. CV: RRR no murmur, right lower extremity +1 edema, +2/4 P posterior tibialis pulses Chest: CTAB, no wheeze or crackles Abd: Soft. NTND. BS present.  Neuro:  Normal gait. PERLA. EOMi. Alert. Oriented x3   Assessment/Plan: KEAUNDRE THELIN is a 84 y.o. male present for OV for  Essential hypertension/Paroxysmal atrial fibrillation (HCC)/Stage 3 chronic kidney disease/HLD- hyperkalemia/CHF - Patient's blood pressure is a little over goal on check today. However home pressures are borderline to normal. Blood pressure today is equal to blood pressure seen in cardiology office yesterday. - reviewed his labs collected cardiology yesterday which included mild hyperkalemia. Patient has discontinued all potassium supplementation. He is following a low potassium diet. - Continue current regimen without changes. Lisinopril 10 mg daily, Lasix 20 mg daily, Coreg 6.25 mg twice a day, amlodipine 2.5 mg twice a day. - Low-sodium diet. Low potassium diet. - Continue eliquis renally dosed 2.5 mg twice a day -  He will monitor HR and BP, goal HR 60-70, BP 110-140/60-85 with him. He is very difficult to control and his hydration and IDA/epo injections cause fluctuations leading to both highs and lower than desired readings. Certainly would rather mildly elevated BP over too low.  - Follow-up 6 months, as long as seeing Dr. Johnsie Cancel every 3 months  electronically signed by:  Howard Pouch, DO  [place]

## 2017-05-06 NOTE — Patient Instructions (Signed)
You are looking good. Your home blood pressures after medications look ok. I would not change anything yet.  Watch your Potassium intake. Mildly elevated on cardio labs. Monitor for symptoms we discussed. Follow up per cardiologist recs.   I am glad you are doing well. I have refilled the medications we provide you.     Hyperkalemia Hyperkalemia is when you have too much potassium in your blood. Potassium is normally removed (excreted) from your body by your kidneys. If there is too much potassium in your blood, it can affect your heart's ability to function. What are the causes? Hyperkalemia may be caused by:  Taking in too much potassium. You can do this by: ? Using salt substitutes. They contain large amounts of potassium. ? Taking potassium supplements. ? Eating foods high in potassium.  Excreting too little potassium. This can happen if: ? Your kidneys are not working properly. Kidney (renal) disease, including short- or long-term renal failure, is a very common cause of hyperkalemia. ? You are taking medicines that lower your excretion of potassium. ? You have Addison disease. ? You have a urinary tract blockage, such as kidney stones. ? You are on treatment to mechanically clean your blood (dialysis) and you skip a treatment.  Releasing a high amount of potassium from your cells into your blood. This can happen with: ? Injury to muscles (rhabdomyolysis) or other tissues. Most potassium is stored in your muscles. ? Severe burns or infections. ? Acidic blood plasma (acidosis). Acidosis can result from many diseases, such as uncontrolled diabetes.  What increases the risk? The most common risk factor of hyperkalemia is kidney disease. Other risk factors of hyperkalemia include:  Addison disease. This is a condition where your glands do not produce enough hormones.  Alcoholism or heavy drug use.  Using certain blood pressure medicines, such as angiotensin-converting enzyme  (ACE) inhibitors, angiotensin II receptor blockers (ARBs), or potassium-sparing diuretics such as spironolactone.  Severe injury or burn.  What are the signs or symptoms? Oftentimes, there are no signs or symptoms of hyperkalemia. However, when your potassium level becomes high enough, you may experience symptoms such as:  Irregular or very slow heartbeat.  Nausea.  Fatigue.  Tingling of the skin or numbness of the hands or feet.  Muscle weakness.  Fatigue.  Not being able to move (paralysis).  You may not have any symptoms of hyperkalemia. How is this diagnosed? Hyperkalemia may be diagnosed by:  Physical exam.  Blood tests.  ECG (electrocardiogram).  Discussion of prescription and non-prescription drug use.  How is this treated? Treatment for hyperkalemia is often directed at the underlying cause. In some instances, treatment may include:  Insulin.  Glucose (sugar) and water solution given through a vein (intravenous or IV).  Dialysis.  Medicines to remove the potassium from your body.  Medicines to move calcium from your bloodstream into your tissues.  Follow these instructions at home:  Take medicines only as directed by your health care provider.  Do not take any supplements, natural products, herbs, or vitamins without reviewing them with your health care provider. Certain supplements and natural food products can have high amounts of potassium.  Limit your alcohol intake as directed by your health care provider.  Stop illegal drug use. If you need help quitting, ask your health care provider.  Keep all follow-up visits as directed by your health care provider. This is important.  If you have kidney disease, you may need to follow a low potassium diet. A  dietitian can help educate you on low potassium foods. Contact a health care provider if:  You notice an irregular or very slow heartbeat.  You feel light-headed.  You feel weak.  You are  nauseous.  You have tingling or numbness in your hands or feet. Get help right away if:  You have shortness of breath.  You have chest pain or discomfort.  You pass out.  You have muscle paralysis. This information is not intended to replace advice given to you by your health care provider. Make sure you discuss any questions you have with your health care provider. Document Released: 04/04/2002 Document Revised: 09/20/2015 Document Reviewed: 07/20/2013 Elsevier Interactive Patient Education  2018 [employer].

## 2017-05-08 ENCOUNTER — Other Ambulatory Visit: Payer: Self-pay | Admitting: *Deleted

## 2017-05-08 MED ORDER — ACCU-CHEK FASTCLIX LANCETS MISC: 3 refills | Status: DC

## 2017-05-08 MED ORDER — GLUCOSE BLOOD VI STRP: ORAL_STRIP | 3 refills | Status: DC

## 2017-05-21 ENCOUNTER — Inpatient Hospital Stay: Payer: Medicare Other

## 2017-05-21 ENCOUNTER — Other Ambulatory Visit: Payer: Self-pay

## 2017-05-21 ENCOUNTER — Inpatient Hospital Stay: Payer: Medicare Other | Attending: Hematology & Oncology | Admitting: Hematology & Oncology

## 2017-05-21 ENCOUNTER — Encounter: Payer: Self-pay | Admitting: Hematology & Oncology

## 2017-05-21 VITALS — BP 161/51 | HR 52 | Temp 97.4°F | Resp 18 | Wt 151.8 lb

## 2017-05-21 DIAGNOSIS — D509 Iron deficiency anemia, unspecified: Secondary | ICD-10-CM | POA: Diagnosis not present

## 2017-05-21 DIAGNOSIS — N189 Chronic kidney disease, unspecified: Secondary | ICD-10-CM | POA: Insufficient documentation

## 2017-05-21 DIAGNOSIS — D631 Anemia in chronic kidney disease: Secondary | ICD-10-CM | POA: Insufficient documentation

## 2017-05-21 DIAGNOSIS — D500 Iron deficiency anemia secondary to blood loss (chronic): Secondary | ICD-10-CM

## 2017-05-21 LAB — CBC WITH DIFFERENTIAL (CANCER CENTER ONLY)
Basophils Absolute: 0 10*3/uL (ref 0.0–0.1)
Basophils Relative: 0 %
EOS ABS: 0.3 10*3/uL (ref 0.0–0.5)
Eosinophils Relative: 4 %
HCT: 37.5 % — ABNORMAL LOW (ref 38.7–49.9)
HEMOGLOBIN: 12.3 g/dL — AB (ref 13.0–17.1)
Lymphocytes Relative: 31 %
Lymphs Abs: 2.3 10*3/uL (ref 0.9–3.3)
MCH: 29.9 pg (ref 28.0–33.4)
MCHC: 32.8 g/dL (ref 32.0–35.9)
MCV: 91.2 fL (ref 82.0–98.0)
Monocytes Absolute: 0.6 10*3/uL (ref 0.1–0.9)
Monocytes Relative: 8 %
NEUTROS PCT: 57 %
Neutro Abs: 4.2 10*3/uL (ref 1.5–6.5)
Platelet Count: 163 10*3/uL (ref 140–400)
RBC: 4.11 MIL/uL — AB (ref 4.20–5.70)
RDW: 15 % (ref 11.1–15.7)
WBC: 7.5 10*3/uL (ref 4.0–10.3)

## 2017-05-21 LAB — CMP (CANCER CENTER ONLY)
ALK PHOS: 77 U/L (ref 26–84)
ALT: 29 U/L (ref 0–55)
AST: 32 U/L (ref 5–34)
Albumin: 3.7 g/dL (ref 3.5–5.0)
Anion gap: 6 (ref 5–15)
BUN: 40 mg/dL — AB (ref 7–22)
CALCIUM: 9.3 mg/dL (ref 8.0–10.3)
CO2: 24 mmol/L (ref 18–33)
CREATININE: 1.8 mg/dL — AB (ref 0.70–1.30)
Chloride: 114 mmol/L — ABNORMAL HIGH (ref 98–108)
Glucose, Bld: 109 mg/dL (ref 70–118)
Potassium: 5.4 mmol/L — ABNORMAL HIGH (ref 3.3–4.7)
SODIUM: 144 mmol/L (ref 128–145)
Total Bilirubin: 0.8 mg/dL (ref 0.2–1.2)
Total Protein: 7.4 g/dL (ref 6.4–8.1)

## 2017-05-21 LAB — RETICULOCYTES
RBC.: 4.15 MIL/uL — ABNORMAL LOW (ref 4.20–5.82)
RETIC CT PCT: 0.9 % (ref 0.8–1.8)
Retic Count, Absolute: 37.4 10*3/uL (ref 34.8–93.9)

## 2017-05-21 LAB — IRON AND TIBC
IRON: 119 ug/dL (ref 42–163)
Saturation Ratios: 49 % (ref 42–163)
TIBC: 244 ug/dL (ref 202–409)
UIBC: 125 ug/dL

## 2017-05-21 LAB — FERRITIN: Ferritin: 371 ng/mL — ABNORMAL HIGH (ref 22–316)

## 2017-05-21 NOTE — Progress Notes (Signed)
Hematology and Oncology Follow Up Visit  [name] [id number] November 02, 1928 84 y.o. 05/21/2017   Principle Diagnosis:  Erythropoietin deficiency anemia Iron deficiency anemia  Current Therapy:   IV iron as indicated Aranesp 300 g subcutaneous as needed for hemoglobin less than 11 - last dose given on 03/2017   Interim History:  [name] is here today for follow-up.  I see him in church all the time.  He is part of our church choir.  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  He did get Aranesp back in early December.  This is what worked well for him.  This always works well for him.  His iron studies look good back in December.  His ferritin was 531 with iron saturation of 32%.  He has had no problems with bleeding.  He does have renal insufficiency.  His family doctor is monitoring this closely.  There is been no fever.  He has had no cough.  He has had no short of breath.  Overall, his performance status is ECOG 2.    Medications:  Allergies as of 05/21/2017      Reactions   Penicillins Hives   Has patient had a PCN reaction causing immediate rash, facial/tongue/throat swelling, SOB or lightheadedness with hypotension: No Has patient had a PCN reaction causing severe rash involving mucus membranes or skin necrosis: Yes Has patient had a PCN reaction that required hospitalization: No Has patient had a PCN reaction occurring within the last 10 years: No If all of the above answers are "NO", then may proceed with Cephalosporin use.      Medication List        Accurate as of 05/21/17 10:15 AM. Always use your most recent med list.          ACCU-CHEK FASTCLIX LANCETS Misc Check fasting blood sugar once daily   allopurinol 300 MG tablet Commonly known as:  ZYLOPRIM Take 0.5 tablets (150 mg total) by mouth daily.   Aloe Vera 25 MG Caps Take 50 mg by mouth daily.   amLODipine 2.5 MG tablet Commonly known as:  NORVASC Take 1 tablet (2.5 mg total) by  mouth 2 (two) times daily.   aspirin 81 MG tablet Take 81 mg by mouth at bedtime.   atorvastatin 20 MG tablet Commonly known as:  LIPITOR Take 1 tablet (20 mg total) by mouth at bedtime.   carvedilol 6.25 MG tablet Commonly known as:  COREG Take 1 tablet (6.25 mg total) by mouth 2 (two) times daily.   cholecalciferol 1000 units tablet Commonly known as:  VITAMIN D Take 1,000 Units by mouth daily.   CHROMIUM GTF PO Take 2 capsules by mouth daily.   Cinnamon 500 MG Tabs Take 2 tablets by mouth at bedtime.   Co Q 10 100 MG Caps Take 100 mg by mouth daily.   dicyclomine 10 MG capsule Commonly known as:  BENTYL Take 1 capsule (10 mg total) by mouth 2 (two) times daily.   ELIQUIS 2.5 MG Tabs tablet Generic drug:  apixaban TAKE 1 TABLET TWICE A DAY   fish oil-omega-3 fatty acids 1000 MG capsule Take 1 g by mouth daily.   furosemide 20 MG tablet Commonly known as:  LASIX Take 1 tablet (20 mg total) by mouth as directed. Take 2 tabs daily for 3 days then change to 1 tab daily   glucose blood test strip Commonly known as:  ACCU-CHEK GUIDE Check fasting blood sugar once daily  lisinopril 10 MG tablet Commonly known as:  PRINIVIL,ZESTRIL Take 1 tablet (10 mg total) by mouth daily.   PRESERVISION AREDS PO Take 1 tablet by mouth 2 (two) times daily.   PROBIOTIC PO Take 1 tablet by mouth daily.   tamsulosin 0.4 MG Caps capsule Commonly known as:  FLOMAX TAKE 1 CAPSULE BY MOUTH ONCE DAILY AFTER SUPPER   VANADYL SULFATE PO Take 1 capsule by mouth daily.   zinc gluconate 50 MG tablet Take 50 mg by mouth daily.       Allergies:  Allergies  Allergen Reactions  . Penicillins Hives    Has patient had a PCN reaction causing immediate rash, facial/tongue/throat swelling, SOB or lightheadedness with hypotension: No Has patient had a PCN reaction causing severe rash involving mucus membranes or skin necrosis: Yes Has patient had a PCN reaction that required  hospitalization: No Has patient had a PCN reaction occurring within the last 10 years: No If all of the above answers are "NO", then may proceed with Cephalosporin use.     Past Medical History, Surgical history, Social history, and Family History were reviewed and updated.  Review of Systems: Review of Systems  Constitutional: Negative.   HENT: Negative.   Eyes: Negative.   Respiratory: Negative.   Cardiovascular: Positive for leg swelling.  Gastrointestinal: Negative.   Genitourinary: Negative.   Musculoskeletal: Positive for joint pain.  Skin: Negative.   Neurological: Negative.   Endo/Heme/Allergies: Negative.   Psychiatric/Behavioral: Negative.     Physical Exam:  weight is 151 lb 12.8 oz (68.9 kg). His oral temperature is 97.4 F (36.3 C) (abnormal). His blood pressure is 161/51 (abnormal) and his pulse is 52 (abnormal). His respiration is 18 and oxygen saturation is 100%.   Wt Readings from Last 3 Encounters:  05/21/17 151 lb 12.8 oz (68.9 kg)  05/05/17 151 lb 12 oz (68.8 kg)  04/14/17 151 lb 1.9 oz (68.5 kg)    Physical Exam  Constitutional: He is oriented to person, place, and time.  HENT:  Head: Normocephalic and atraumatic.  Mouth/Throat: Oropharynx is clear and moist.  Eyes: EOM are normal. Pupils are equal, round, and reactive to light.  Neck: Normal range of motion.  Cardiovascular: Normal rate, regular rhythm and normal heart sounds.  Pulmonary/Chest: Effort normal and breath sounds normal.  Abdominal: Soft. Bowel sounds are normal.  Musculoskeletal: Normal range of motion. He exhibits no edema, tenderness or deformity.  Lymphadenopathy:    He has no cervical adenopathy.  Neurological: He is alert and oriented to person, place, and time.  Skin: Skin is warm and dry. No rash noted. No erythema.  Psychiatric: He has a normal mood and affect. His behavior is normal. Judgment and thought content normal.  Vitals reviewed.    Lab Results  Component  Value Date   WBC 7.5 05/21/2017   HGB 12.9 (L) 05/05/2017   HCT 37.5 (L) 05/21/2017   MCV 91.2 05/21/2017   PLT 163 05/21/2017   Lab Results  Component Value Date   FERRITIN 531 (H) 04/09/2017   IRON 80 04/09/2017   TIBC 250 04/09/2017   UIBC 170 04/09/2017   IRONPCTSAT 32 04/09/2017   Lab Results  Component Value Date   RBC 4.11 (L) 05/21/2017   No results found for: KPAFRELGTCHN, LAMBDASER, KAPLAMBRATIO No results found for: IGGSERUM, Regino Bellow, IGMSERUM Lab Results  Component Value Date   TOTALPROTELP 6.0 (L) 12/26/2015   ALBUMINELP 3.4 (L) 12/26/2015   A1GS 0.4 (H) 12/26/2015   A2GS 0.8  12/26/2015   BETS 0.4 12/26/2015   BETA2SER 0.3 12/26/2015   GAMS 0.8 12/26/2015   MSPIKE Not Observed 01/29/2016   SPEI SEE NOTE 12/26/2015     Chemistry      Component Value Date/Time   NA 144 05/21/2017 0932   NA 142 05/05/2017 0940   NA 145 04/09/2017 0905   NA 140 03/28/2016 0853   K 5.4 (H) 05/21/2017 0932   K 4.5 04/09/2017 0905   K 4.4 03/28/2016 0853   CL 114 (H) 05/21/2017 0932   CL 105 04/09/2017 0905   CO2 24 05/21/2017 0932   CO2 27 04/09/2017 0905   CO2 19 (L) 03/28/2016 0853   BUN 40 (H) 05/21/2017 0932   BUN 33 (H) 05/05/2017 0940   BUN 33 (H) 04/09/2017 0905   BUN 34.4 (H) 03/28/2016 0853   CREATININE 1.47 (H) 05/05/2017 0940   CREATININE 1.9 (H) 04/09/2017 0905   CREATININE 1.7 (H) 03/28/2016 0853   GLU 113 07/24/2016      Component Value Date/Time   CALCIUM 9.3 05/21/2017 0932   CALCIUM 9.2 04/09/2017 0905   CALCIUM 9.2 03/28/2016 0853   ALKPHOS 77 05/21/2017 0932   ALKPHOS 69 04/09/2017 0905   ALKPHOS 85 03/28/2016 0853   AST 32 05/21/2017 0932   AST 23 03/28/2016 0853   ALT 29 05/21/2017 0932   ALT 27 04/09/2017 0905   ALT 37 03/28/2016 0853   BILITOT 0.8 05/21/2017 0932   BILITOT 0.79 03/28/2016 0853      Impression and Plan: [name] is a very pleasant 84 yo caucasian gentleman with both erythropoietin deficiency anemia and iron  deficiency anemia.   For right now, we do not have to give him any Aranesp.  I would like to think that his iron studies should also be okay.  We will plan to get him back in 2 months now.  I think this would be a reasonable amount of time for him to come back.  [name], MD 1/24/201910:15 AM

## 2017-05-27 ENCOUNTER — Telehealth: Payer: Self-pay

## 2017-05-27 ENCOUNTER — Other Ambulatory Visit: Payer: Medicare Other | Admitting: *Deleted

## 2017-05-27 DIAGNOSIS — E875 Hyperkalemia: Secondary | ICD-10-CM | POA: Diagnosis not present

## 2017-05-27 LAB — BASIC METABOLIC PANEL
BUN / CREAT RATIO: 23 (ref 10–24)
BUN: 40 mg/dL — AB (ref 8–27)
CHLORIDE: 109 mmol/L — AB (ref 96–106)
CO2: 15 mmol/L — ABNORMAL LOW (ref 20–29)
Calcium: 8.9 mg/dL (ref 8.6–10.2)
Creatinine, Ser: 1.71 mg/dL — ABNORMAL HIGH (ref 0.76–1.27)
GFR calc Af Amer: 41 mL/min/{1.73_m2} — ABNORMAL LOW (ref >59)
GFR calc non Af Amer: 35 mL/min/{1.73_m2} — ABNORMAL LOW (ref >59)
GLUCOSE: 108 mg/dL — AB (ref 65–99)
Potassium: 5.8 mmol/L — ABNORMAL HIGH (ref 3.5–5.2)
SODIUM: 138 mmol/L (ref 134–144)

## 2017-05-27 NOTE — Telephone Encounter (Signed)
-----   Message from [name], MD sent at 05/27/2017  3:41 PM EST ----- K elevated should be off all Kdur and Ace continue lasix

## 2017-05-27 NOTE — Telephone Encounter (Signed)
Patient aware of lab results. Per Dr. Johnsie Cancel, K elevated should be off all Kdur and Ace continue lasix. Patient verbalized understanding.

## 2017-05-28 DIAGNOSIS — L812 Freckles: Secondary | ICD-10-CM | POA: Diagnosis not present

## 2017-05-28 DIAGNOSIS — D485 Neoplasm of uncertain behavior of skin: Secondary | ICD-10-CM | POA: Diagnosis not present

## 2017-05-28 DIAGNOSIS — Z85828 Personal history of other malignant neoplasm of skin: Secondary | ICD-10-CM | POA: Diagnosis not present

## 2017-05-28 DIAGNOSIS — L820 Inflamed seborrheic keratosis: Secondary | ICD-10-CM | POA: Diagnosis not present

## 2017-05-28 DIAGNOSIS — L570 Actinic keratosis: Secondary | ICD-10-CM | POA: Diagnosis not present

## 2017-05-28 DIAGNOSIS — L821 Other seborrheic keratosis: Secondary | ICD-10-CM | POA: Diagnosis not present

## 2017-05-28 DIAGNOSIS — D1801 Hemangioma of skin and subcutaneous tissue: Secondary | ICD-10-CM | POA: Diagnosis not present

## 2017-06-20 ENCOUNTER — Other Ambulatory Visit: Payer: Self-pay

## 2017-06-20 ENCOUNTER — Encounter (HOSPITAL_BASED_OUTPATIENT_CLINIC_OR_DEPARTMENT_OTHER): Payer: Self-pay | Admitting: *Deleted

## 2017-06-20 ENCOUNTER — Inpatient Hospital Stay (HOSPITAL_BASED_OUTPATIENT_CLINIC_OR_DEPARTMENT_OTHER)
Admission: EM | Admit: 2017-06-20 | Discharge: 2017-06-22 | DRG: 309 | Disposition: A | Discharge status: Home / Self Care | Payer: Medicare Other | Attending: Internal Medicine | Admitting: Internal Medicine

## 2017-06-20 ENCOUNTER — Emergency Department (HOSPITAL_BASED_OUTPATIENT_CLINIC_OR_DEPARTMENT_OTHER): Payer: Medicare Other

## 2017-06-20 DIAGNOSIS — Z85828 Personal history of other malignant neoplasm of skin: Secondary | ICD-10-CM

## 2017-06-20 DIAGNOSIS — D509 Iron deficiency anemia, unspecified: Secondary | ICD-10-CM | POA: Diagnosis present

## 2017-06-20 DIAGNOSIS — Z8249 Family history of ischemic heart disease and other diseases of the circulatory system: Secondary | ICD-10-CM

## 2017-06-20 DIAGNOSIS — E1122 Type 2 diabetes mellitus with diabetic chronic kidney disease: Secondary | ICD-10-CM | POA: Diagnosis present

## 2017-06-20 DIAGNOSIS — D631 Anemia in chronic kidney disease: Secondary | ICD-10-CM

## 2017-06-20 DIAGNOSIS — E7849 Other hyperlipidemia: Secondary | ICD-10-CM

## 2017-06-20 DIAGNOSIS — I130 Hypertensive heart and chronic kidney disease with heart failure and stage 1 through stage 4 chronic kidney disease, or unspecified chronic kidney disease: Secondary | ICD-10-CM | POA: Diagnosis present

## 2017-06-20 DIAGNOSIS — Z7901 Long term (current) use of anticoagulants: Secondary | ICD-10-CM

## 2017-06-20 DIAGNOSIS — Z823 Family history of stroke: Secondary | ICD-10-CM

## 2017-06-20 DIAGNOSIS — Z87891 Personal history of nicotine dependence: Secondary | ICD-10-CM

## 2017-06-20 DIAGNOSIS — I481 Persistent atrial fibrillation: Secondary | ICD-10-CM | POA: Diagnosis not present

## 2017-06-20 DIAGNOSIS — I340 Nonrheumatic mitral (valve) insufficiency: Secondary | ICD-10-CM | POA: Diagnosis present

## 2017-06-20 DIAGNOSIS — I5033 Acute on chronic diastolic (congestive) heart failure: Secondary | ICD-10-CM

## 2017-06-20 DIAGNOSIS — E785 Hyperlipidemia, unspecified: Secondary | ICD-10-CM

## 2017-06-20 DIAGNOSIS — Z951 Presence of aortocoronary bypass graft: Secondary | ICD-10-CM

## 2017-06-20 DIAGNOSIS — Z79899 Other long term (current) drug therapy: Secondary | ICD-10-CM

## 2017-06-20 DIAGNOSIS — N183 Chronic kidney disease, stage 3 (moderate): Secondary | ICD-10-CM | POA: Diagnosis present

## 2017-06-20 DIAGNOSIS — I4892 Unspecified atrial flutter: Secondary | ICD-10-CM | POA: Diagnosis not present

## 2017-06-20 DIAGNOSIS — Z7982 Long term (current) use of aspirin: Secondary | ICD-10-CM

## 2017-06-20 DIAGNOSIS — I5032 Chronic diastolic (congestive) heart failure: Secondary | ICD-10-CM | POA: Diagnosis not present

## 2017-06-20 DIAGNOSIS — I25708 Atherosclerosis of coronary artery bypass graft(s), unspecified, with other forms of angina pectoris: Secondary | ICD-10-CM

## 2017-06-20 DIAGNOSIS — Z808 Family history of malignant neoplasm of other organs or systems: Secondary | ICD-10-CM

## 2017-06-20 DIAGNOSIS — R0602 Shortness of breath: Secondary | ICD-10-CM | POA: Diagnosis not present

## 2017-06-20 DIAGNOSIS — M109 Gout, unspecified: Secondary | ICD-10-CM | POA: Diagnosis present

## 2017-06-20 DIAGNOSIS — N400 Enlarged prostate without lower urinary tract symptoms: Secondary | ICD-10-CM | POA: Diagnosis present

## 2017-06-20 DIAGNOSIS — D539 Nutritional anemia, unspecified: Secondary | ICD-10-CM | POA: Diagnosis present

## 2017-06-20 DIAGNOSIS — I441 Atrioventricular block, second degree: Secondary | ICD-10-CM

## 2017-06-20 DIAGNOSIS — R7989 Other specified abnormal findings of blood chemistry: Secondary | ICD-10-CM | POA: Diagnosis not present

## 2017-06-20 DIAGNOSIS — Z880 Allergy status to penicillin: Secondary | ICD-10-CM

## 2017-06-20 DIAGNOSIS — D649 Anemia, unspecified: Secondary | ICD-10-CM

## 2017-06-20 DIAGNOSIS — R001 Bradycardia, unspecified: Secondary | ICD-10-CM | POA: Diagnosis present

## 2017-06-20 DIAGNOSIS — I739 Peripheral vascular disease, unspecified: Secondary | ICD-10-CM

## 2017-06-20 DIAGNOSIS — I2510 Atherosclerotic heart disease of native coronary artery without angina pectoris: Secondary | ICD-10-CM | POA: Diagnosis present

## 2017-06-20 DIAGNOSIS — R778 Other specified abnormalities of plasma proteins: Secondary | ICD-10-CM

## 2017-06-20 DIAGNOSIS — E1151 Type 2 diabetes mellitus with diabetic peripheral angiopathy without gangrene: Secondary | ICD-10-CM | POA: Diagnosis present

## 2017-06-20 LAB — BASIC METABOLIC PANEL
ANION GAP: 11 (ref 5–15)
BUN: 45 mg/dL — AB (ref 6–20)
CO2: 19 mmol/L — ABNORMAL LOW (ref 22–32)
Calcium: 8.7 mg/dL — ABNORMAL LOW (ref 8.9–10.3)
Chloride: 110 mmol/L (ref 101–111)
Creatinine, Ser: 1.74 mg/dL — ABNORMAL HIGH (ref 0.61–1.24)
GFR, EST AFRICAN AMERICAN: 39 mL/min — AB (ref >60)
GFR, EST NON AFRICAN AMERICAN: 34 mL/min — AB (ref >60)
Glucose, Bld: 128 mg/dL — ABNORMAL HIGH (ref 65–99)
POTASSIUM: 3.9 mmol/L (ref 3.5–5.1)
SODIUM: 140 mmol/L (ref 135–145)

## 2017-06-20 LAB — CBC WITH DIFFERENTIAL/PLATELET
BASOS ABS: 0 10*3/uL (ref 0.0–0.1)
Basophils Relative: 0 %
Eosinophils Absolute: 0.4 10*3/uL (ref 0.0–0.7)
Eosinophils Relative: 5 %
HCT: 28.4 % — ABNORMAL LOW (ref 39.0–52.0)
Hemoglobin: 9.4 g/dL — ABNORMAL LOW (ref 13.0–17.0)
Lymphocytes Relative: 29 %
Lymphs Abs: 2.1 10*3/uL (ref 0.7–4.0)
MCH: 30.1 pg (ref 26.0–34.0)
MCHC: 33.1 g/dL (ref 30.0–36.0)
MCV: 91 fL (ref 78.0–100.0)
Monocytes Absolute: 0.9 10*3/uL (ref 0.1–1.0)
Monocytes Relative: 13 %
Neutro Abs: 3.8 10*3/uL (ref 1.7–7.7)
Neutrophils Relative %: 53 %
PLATELETS: 167 10*3/uL (ref 150–400)
RBC: 3.12 MIL/uL — AB (ref 4.22–5.81)
RDW: 15 % (ref 11.5–15.5)
WBC: 7.3 10*3/uL (ref 4.0–10.5)

## 2017-06-20 NOTE — ED Notes (Signed)
ED Provider at bedside. 

## 2017-06-20 NOTE — ED Provider Notes (Addendum)
[facility] Provider Note: Georgena Spurling, MD, FACEP  CSN: [id number] MRN: [id number] ARRIVAL: 06/20/17 at 2208 ROOM: [place]  Bradycardia   HISTORY OF PRESENT ILLNESS  06/20/17 11:04 PM [name] is a 84 y.o. male with a history of proximal atrial fibrillation his last cardioversion was December 5 of last year.  He is here with about a 1 week history of skipped beats.  They had been occurring only several times a day but today worsened and he is having skipped beats on a regular basis.  He also noted that his heart rate is slow, as low as in the 30s at times.  He is having some associated shortness of breath and lightheadedness but denies chest pain.  He is having some pain in the back of his neck which is worse with movement of the neck.  He is not sure if this is related to his heart.  He is having some lower extremity edema for which she was recently placed on Lasix.  He recently had his K-Dur and lisinopril discontinued due to hyperkalemia.  He is continuing to take his Lasix.   Past Medical History:  Diagnosis Date  . A-fib ([place])   . Basal cell carcinoma    skin  . Bigeminal rhythm   . Cataract   . Chronic renal insufficiency, stage 3 (moderate) (HCC) 2018   GFR 30s-40s  . Coronary artery disease    post bypass  . CVD (cardiovascular disease)   . Diabetes mellitus without complication ([place])   . Diverticular disease   . Easy bruising   . 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 Gout   . Hernia   . Hyperlipidemia   . Hypertension   . Iron deficiency anemia 02/04/2016  . Macular degeneration   . Microscopic colitis   . PVD (peripheral vascular disease) ([place])     Past Surgical History:  Procedure Laterality Date  . CARDIAC CATHETERIZATION  2006  . CARDIOVERSION N/A 02/22/2013   Procedure: CARDIOVERSION;  Surgeon: [name], MD;  Location: [facility] ENDOSCOPY;  Service: Cardiovascular;  Laterality: N/A;  . CARDIOVERSION N/A  11/15/2014   Procedure: CARDIOVERSION;  Surgeon: [name], MD;  Location: [facility] ENDOSCOPY;  Service: Cardiovascular;  Laterality: N/A;  . CARDIOVERSION N/A 04/01/2017   Procedure: CARDIOVERSION;  Surgeon: Larey Dresser, MD;  Location: [facility];  Service: Cardiovascular;  Laterality: N/A;  . CAROTID ENDARTERECTOMY  2009/ 1993   left/ right  . CORONARY ARTERY BYPASS GRAFT  1999  . EYE SURGERY     eyelid repair  . INGUINAL HERNIA REPAIR  02/11/2012   Procedure: LAPAROSCOPIC BILATERAL INGUINAL HERNIA REPAIR;  Surgeon: Pedro Earls, MD;  Location: [facility] ORS;  Service: General;  Laterality: Bilateral;  . SKIN CANCER EXCISION     right ear x 3    Family History  Problem Relation Age of Onset  . Stroke Mother   . Coronary artery disease Father   . Heart disease Father   . Melanoma Sister     Social History   Tobacco Use  . Smoking status: Former Smoker    Last attempt to quit: 12/25/1961    Years since quitting: 55.5  . Smokeless tobacco: Former Systems developer    Types: Chew    Quit date: 02/03/1969  Substance Use Topics  . Alcohol use: No    Alcohol/week: 0.0 oz  . Drug use: No    Prior to Admission medications   Medication  Sig Start Date End Date Taking? Authorizing Provider  ACCU-CHEK FASTCLIX LANCETS MISC Check fasting blood sugar once daily 05/08/17   Kuneff, Renee A, DO  allopurinol (ZYLOPRIM) 300 MG tablet Take 0.5 tablets (150 mg total) by mouth daily. 03/31/17   Kuneff, Renee A, DO  Aloe Vera 25 MG CAPS Take 50 mg by mouth daily.     [provider]  amLODipine (NORVASC) 2.5 MG tablet Take 1 tablet (2.5 mg total) by mouth 2 (two) times daily. 05/06/17   Kuneff, Renee A, DO  aspirin 81 MG tablet Take 81 mg by mouth at bedtime.     [provider]  atorvastatin (LIPITOR) 20 MG tablet Take 1 tablet (20 mg total) by mouth at bedtime. 01/15/17   Kuneff, Renee A, DO  carvedilol (COREG) 6.25 MG tablet Take 1 tablet (6.25 mg total) by mouth 2 (two) times daily.  04/27/17   [name], MD  cholecalciferol (VITAMIN D) 1000 units tablet Take 1,000 Units by mouth daily.     [provider]  CHROMIUM GTF PO Take 2 capsules by mouth daily.    [provider]  Cinnamon 500 MG TABS Take 2 tablets by mouth at bedtime.     [provider]  Coenzyme Q10 (CO Q 10) 100 MG CAPS Take 100 mg by mouth daily.     [provider]  dicyclomine (BENTYL) 10 MG capsule Take 1 capsule (10 mg total) by mouth 2 (two) times daily. 05/06/17   Kuneff, Renee A, DO  ELIQUIS 2.5 MG TABS tablet TAKE 1 TABLET TWICE A DAY 08/12/16   [name], MD  fish oil-omega-3 fatty acids 1000 MG capsule Take 1 g by mouth daily.     [provider]  furosemide (LASIX) 20 MG tablet Take 1 tablet (20 mg total) by mouth as directed. Take 2 tabs daily for 3 days then change to 1 tab daily 03/24/17 06/22/17  Imogene Burn, PA-C  glucose blood (ACCU-CHEK GUIDE) test strip Check fasting blood sugar once daily 05/08/17   Kuneff, Renee A, DO  Multiple Vitamins-Minerals (PRESERVISION AREDS PO) Take 1 tablet by mouth 2 (two) times daily.     [provider]  Probiotic Product (PROBIOTIC PO) Take 1 tablet by mouth daily.    [provider]  tamsulosin (FLOMAX) 0.4 MG CAPS capsule TAKE 1 CAPSULE BY MOUTH ONCE DAILY AFTER SUPPER 03/23/17   Kuneff, Renee A, DO  VANADYL SULFATE PO Take 1 capsule by mouth daily.    [provider]  zinc gluconate 50 MG tablet Take 50 mg by mouth daily.     [provider]    Allergies Penicillins   REVIEW OF SYSTEMS  Negative except as noted here or in the History of Present Illness.   PHYSICAL EXAMINATION  Initial Vital Signs Pulse (!) 48, temperature 97.6 F (36.4 C), temperature source Oral, resp. rate 18, height 5\' 8"  (1.727 m), weight 68 kg (150 lb), SpO2 97 %.  Examination General: Well-developed, well-nourished male in no acute distress; appearance consistent with age of  record HENT: normocephalic; atraumatic Eyes: pupils equal, round and reactive to light; extraocular muscles intact Neck: supple Heart: Irregular rhythm consistent with Mobitz type I (Wenckebach); rate drops to as low as mid 30s at times Lungs: clear to auscultation bilaterally Abdomen: soft; nondistended; nontender; bowel sounds present Extremities: No deformity; full range of motion; +1 edema of lower legs Neurologic: Awake, alert and oriented; motor function intact in all extremities  and symmetric; no facial droop Skin: Warm and dry Psychiatric: Normal mood and affect   RESULTS  Summary of this visit's results, reviewed by myself:   EKG Interpretation  Date/Time:  Saturday June 20 2017 22:14:15 EST Ventricular Rate:  55 PR Interval:    QRS Duration: 106 QT Interval:  430 QTC Calculation: 411 R Axis:   99 Text Interpretation: Critical Test Result: AV Block Sinus rhythm with 2nd degree A-V block (Mobitz I) Rightward axis Possible Anterior infarct , age undetermined Abnormal ECG Previously ectopic atrial beat with 2:1 block Confirmed by Shanon Rosser [phone]) on 06/20/2017 11:24:03 PM      Laboratory Studies: Results for orders placed or performed during the hospital encounter of 06/20/17 (from the past 24 hour(s))  Basic metabolic panel     Status: Abnormal   Collection Time: 06/20/17 11:19 PM  Result Value Ref Range   Sodium 140 135 - 145 mmol/L   Potassium 3.9 3.5 - 5.1 mmol/L   Chloride 110 101 - 111 mmol/L   CO2 19 (L) 22 - 32 mmol/L   Glucose, Bld 128 (H) 65 - 99 mg/dL   BUN 45 (H) 6 - 20 mg/dL   Creatinine, Ser 1.74 (H) 0.61 - 1.24 mg/dL   Calcium 8.7 (L) 8.9 - 10.3 mg/dL   GFR calc non Af Amer 34 (L) >60 mL/min   GFR calc Af Amer 39 (L) >60 mL/min   Anion gap 11 5 - 15  Troponin I     Status: Abnormal   Collection Time: 06/20/17 11:19 PM  Result Value Ref Range   Troponin I 0.03 (HH) <0.03 ng/mL  CBC with Differential/Platelet     Status: Abnormal    Collection Time: 06/20/17 11:19 PM  Result Value Ref Range   WBC 7.3 4.0 - 10.5 K/uL   RBC 3.12 (L) 4.22 - 5.81 MIL/uL   Hemoglobin 9.4 (L) 13.0 - 17.0 g/dL   HCT 28.4 (L) 39.0 - 52.0 %   MCV 91.0 78.0 - 100.0 fL   MCH 30.1 26.0 - 34.0 pg   MCHC 33.1 30.0 - 36.0 g/dL   RDW 15.0 11.5 - 15.5 %   Platelets 167 150 - 400 K/uL   Neutrophils Relative % 53 %   Neutro Abs 3.8 1.7 - 7.7 K/uL   Lymphocytes Relative 29 %   Lymphs Abs 2.1 0.7 - 4.0 K/uL   Monocytes Relative 13 %   Monocytes Absolute 0.9 0.1 - 1.0 K/uL   Eosinophils Relative 5 %   Eosinophils Absolute 0.4 0.0 - 0.7 K/uL   Basophils Relative 0 %   Basophils Absolute 0.0 0.0 - 0.1 K/uL   Imaging Studies: Dg Chest 2 View  Result Date: 06/20/2017 CLINICAL DATA:  Irregular heart rate, shortness of breath and palpitations for several days. EXAM: CHEST  2 VIEW COMPARISON:  03/17/2017 and prior radiographs FINDINGS: Cardiomegaly and CABG changes again noted. There is no evidence of focal airspace disease, pulmonary edema, suspicious pulmonary nodule/mass, pleural effusion, or pneumothorax. No acute bony abnormalities are identified. IMPRESSION: Cardiomegaly without evidence of acute cardiopulmonary disease. Electronically Signed   By: Margarette Canada M.D.   On: 06/20/2017 22:41    ED COURSE  Nursing notes and initial vitals signs, including pulse oximetry, reviewed.  Vitals:   06/20/17 2212 06/20/17 2214 06/20/17 2302  BP:   (!) 150/54  Pulse: (!) 50  (!) 48  Resp: 18  18  Temp: 97.6 F (36.4 C)    TempSrc: Oral  SpO2:   97%  Weight:  68 kg (150 lb)   Height:  5\' 8"  (1.727 m)    12:21 AM We will have patient admitted to cardiology for symptomatic Mobitz type I atrioventricular block.  Troponin is elevated likely due to the arrhythmia.  PROCEDURES   CRITICAL CARE Performed by: Shanon Rosser L Total critical care time: 30 minutes Critical care time was exclusive of separately billable procedures and treating other  patients. Critical care was necessary to treat or prevent imminent or life-threatening deterioration. Critical care was time spent personally by me on the following activities: development of treatment plan with patient and/or surrogate as well as nursing, discussions with consultants, evaluation of patient's response to treatment, examination of patient, obtaining history from patient or surrogate, ordering and performing treatments and interventions, ordering and review of laboratory studies, ordering and review of radiographic studies, pulse oximetry and re-evaluation of patient's condition.  ED DIAGNOSES     ICD-10-CM   1. Mobitz (type) I (Wenckebach's) atrioventricular block I44.1   2. Elevated troponin I level R74.8        Maryclare Nydam, Jenny Reichmann, MD 06/21/17 0041    Shanon Rosser, MD 06/21/17 1038

## 2017-06-20 NOTE — ED Triage Notes (Signed)
Palpitations, bradycardia, posterior neck pain and SOB that started today.  Pt slightly pale in triage.

## 2017-06-21 ENCOUNTER — Other Ambulatory Visit: Payer: Self-pay

## 2017-06-21 DIAGNOSIS — R778 Other specified abnormalities of plasma proteins: Secondary | ICD-10-CM

## 2017-06-21 DIAGNOSIS — E7849 Other hyperlipidemia: Secondary | ICD-10-CM

## 2017-06-21 DIAGNOSIS — I5032 Chronic diastolic (congestive) heart failure: Secondary | ICD-10-CM

## 2017-06-21 DIAGNOSIS — I2510 Atherosclerotic heart disease of native coronary artery without angina pectoris: Secondary | ICD-10-CM | POA: Diagnosis not present

## 2017-06-21 DIAGNOSIS — E1122 Type 2 diabetes mellitus with diabetic chronic kidney disease: Secondary | ICD-10-CM | POA: Diagnosis not present

## 2017-06-21 DIAGNOSIS — I441 Atrioventricular block, second degree: Secondary | ICD-10-CM | POA: Diagnosis not present

## 2017-06-21 DIAGNOSIS — D539 Nutritional anemia, unspecified: Secondary | ICD-10-CM | POA: Diagnosis present

## 2017-06-21 DIAGNOSIS — R001 Bradycardia, unspecified: Secondary | ICD-10-CM | POA: Diagnosis present

## 2017-06-21 DIAGNOSIS — E785 Hyperlipidemia, unspecified: Secondary | ICD-10-CM

## 2017-06-21 DIAGNOSIS — N183 Chronic kidney disease, stage 3 (moderate): Secondary | ICD-10-CM | POA: Diagnosis not present

## 2017-06-21 DIAGNOSIS — Z823 Family history of stroke: Secondary | ICD-10-CM | POA: Diagnosis not present

## 2017-06-21 DIAGNOSIS — I10 Essential (primary) hypertension: Secondary | ICD-10-CM

## 2017-06-21 DIAGNOSIS — D509 Iron deficiency anemia, unspecified: Secondary | ICD-10-CM | POA: Diagnosis present

## 2017-06-21 DIAGNOSIS — I25708 Atherosclerosis of coronary artery bypass graft(s), unspecified, with other forms of angina pectoris: Secondary | ICD-10-CM

## 2017-06-21 DIAGNOSIS — R7989 Other specified abnormal findings of blood chemistry: Secondary | ICD-10-CM

## 2017-06-21 DIAGNOSIS — R748 Abnormal levels of other serum enzymes: Secondary | ICD-10-CM | POA: Diagnosis not present

## 2017-06-21 DIAGNOSIS — M109 Gout, unspecified: Secondary | ICD-10-CM | POA: Diagnosis present

## 2017-06-21 DIAGNOSIS — Z7901 Long term (current) use of anticoagulants: Secondary | ICD-10-CM | POA: Diagnosis not present

## 2017-06-21 DIAGNOSIS — I340 Nonrheumatic mitral (valve) insufficiency: Secondary | ICD-10-CM | POA: Diagnosis present

## 2017-06-21 DIAGNOSIS — I481 Persistent atrial fibrillation: Principal | ICD-10-CM

## 2017-06-21 DIAGNOSIS — Z87891 Personal history of nicotine dependence: Secondary | ICD-10-CM | POA: Diagnosis not present

## 2017-06-21 DIAGNOSIS — Z79899 Other long term (current) drug therapy: Secondary | ICD-10-CM | POA: Diagnosis not present

## 2017-06-21 DIAGNOSIS — I739 Peripheral vascular disease, unspecified: Secondary | ICD-10-CM

## 2017-06-21 DIAGNOSIS — I5033 Acute on chronic diastolic (congestive) heart failure: Secondary | ICD-10-CM

## 2017-06-21 DIAGNOSIS — Z8249 Family history of ischemic heart disease and other diseases of the circulatory system: Secondary | ICD-10-CM | POA: Diagnosis not present

## 2017-06-21 DIAGNOSIS — Z7982 Long term (current) use of aspirin: Secondary | ICD-10-CM | POA: Diagnosis not present

## 2017-06-21 DIAGNOSIS — Z951 Presence of aortocoronary bypass graft: Secondary | ICD-10-CM | POA: Diagnosis not present

## 2017-06-21 DIAGNOSIS — I4892 Unspecified atrial flutter: Secondary | ICD-10-CM | POA: Diagnosis not present

## 2017-06-21 DIAGNOSIS — E1151 Type 2 diabetes mellitus with diabetic peripheral angiopathy without gangrene: Secondary | ICD-10-CM | POA: Diagnosis not present

## 2017-06-21 DIAGNOSIS — D649 Anemia, unspecified: Secondary | ICD-10-CM

## 2017-06-21 DIAGNOSIS — Z808 Family history of malignant neoplasm of other organs or systems: Secondary | ICD-10-CM | POA: Diagnosis not present

## 2017-06-21 DIAGNOSIS — Z880 Allergy status to penicillin: Secondary | ICD-10-CM | POA: Diagnosis not present

## 2017-06-21 DIAGNOSIS — Z85828 Personal history of other malignant neoplasm of skin: Secondary | ICD-10-CM | POA: Diagnosis not present

## 2017-06-21 DIAGNOSIS — I130 Hypertensive heart and chronic kidney disease with heart failure and stage 1 through stage 4 chronic kidney disease, or unspecified chronic kidney disease: Secondary | ICD-10-CM | POA: Diagnosis not present

## 2017-06-21 HISTORY — DX: Bradycardia, unspecified: R00.1

## 2017-06-21 LAB — CBC
HEMATOCRIT: 30.1 % — AB (ref 39.0–52.0)
Hemoglobin: 9.7 g/dL — ABNORMAL LOW (ref 13.0–17.0)
MCH: 29 pg (ref 26.0–34.0)
MCHC: 32.2 g/dL (ref 30.0–36.0)
MCV: 90.1 fL (ref 78.0–100.0)
Platelets: 163 10*3/uL (ref 150–400)
RBC: 3.34 MIL/uL — ABNORMAL LOW (ref 4.22–5.81)
RDW: 15.5 % (ref 11.5–15.5)
WBC: 6.3 10*3/uL (ref 4.0–10.5)

## 2017-06-21 LAB — GLUCOSE, CAPILLARY
GLUCOSE-CAPILLARY: 120 mg/dL — AB (ref 65–99)
GLUCOSE-CAPILLARY: 86 mg/dL (ref 65–99)
Glucose-Capillary: 139 mg/dL — ABNORMAL HIGH (ref 65–99)
Glucose-Capillary: 125 mg/dL — ABNORMAL HIGH (ref 65–99)

## 2017-06-21 LAB — LIPID PANEL
Cholesterol: 110 mg/dL (ref 0–200)
HDL: 46 mg/dL (ref >40)
LDL CALC: 55 mg/dL (ref 0–99)
Total CHOL/HDL Ratio: 2.4 RATIO
Triglycerides: 44 mg/dL (ref <150)
VLDL: 9 mg/dL (ref 0–40)

## 2017-06-21 LAB — BASIC METABOLIC PANEL
ANION GAP: 9 (ref 5–15)
BUN: 39 mg/dL — ABNORMAL HIGH (ref 6–20)
CALCIUM: 8.6 mg/dL — AB (ref 8.9–10.3)
CHLORIDE: 109 mmol/L (ref 101–111)
CO2: 21 mmol/L — AB (ref 22–32)
Creatinine, Ser: 1.69 mg/dL — ABNORMAL HIGH (ref 0.61–1.24)
GFR calc Af Amer: 41 mL/min — ABNORMAL LOW (ref >60)
GFR calc non Af Amer: 35 mL/min — ABNORMAL LOW (ref >60)
Glucose, Bld: 100 mg/dL — ABNORMAL HIGH (ref 65–99)
Potassium: 3.9 mmol/L (ref 3.5–5.1)
Sodium: 139 mmol/L (ref 135–145)

## 2017-06-21 LAB — TROPONIN I
Troponin I: 0.03 ng/mL (ref <0.03)
Troponin I: <0.03 ng/mL (ref <0.03)
Troponin I: <0.03 ng/mL (ref <0.03)

## 2017-06-21 LAB — BRAIN NATRIURETIC PEPTIDE: B Natriuretic Peptide: 468.6 pg/mL — ABNORMAL HIGH (ref 0.0–100.0)

## 2017-06-21 MED ORDER — TAMSULOSIN HCL 0.4 MG PO CAPS
0.4000 mg | ORAL_CAPSULE | Freq: Every day | ORAL | Status: DC
  Administered 2017-06-21 – 2017-06-22 (×2): 0.4 mg via ORAL
  Filled 2017-06-21 (×2): qty 1

## 2017-06-21 MED ORDER — INSULIN ASPART 100 UNIT/ML ~~LOC~~ SOLN
0.0000 [IU] | Freq: Three times a day (TID) | SUBCUTANEOUS | Status: DC
  Administered 2017-06-21: 1 [IU] via SUBCUTANEOUS

## 2017-06-21 MED ORDER — ASPIRIN EC 81 MG PO TBEC
81.0000 mg | DELAYED_RELEASE_TABLET | Freq: Every day | ORAL | Status: DC
  Administered 2017-06-21: 81 mg via ORAL
  Filled 2017-06-21: qty 1

## 2017-06-21 MED ORDER — VITAMIN D 1000 UNITS PO TABS
1000.0000 [IU] | ORAL_TABLET | Freq: Every day | ORAL | Status: DC
  Administered 2017-06-21 – 2017-06-22 (×2): 1000 [IU] via ORAL
  Filled 2017-06-21 (×2): qty 1

## 2017-06-21 MED ORDER — FUROSEMIDE 20 MG PO TABS
20.0000 mg | ORAL_TABLET | Freq: Every day | ORAL | Status: DC
  Administered 2017-06-21 – 2017-06-22 (×2): 20 mg via ORAL
  Filled 2017-06-21 (×2): qty 1

## 2017-06-21 MED ORDER — AMLODIPINE BESYLATE 2.5 MG PO TABS
2.5000 mg | ORAL_TABLET | Freq: Two times a day (BID) | ORAL | Status: DC
  Administered 2017-06-21: 2.5 mg via ORAL
  Filled 2017-06-21: qty 1

## 2017-06-21 MED ORDER — AMLODIPINE BESYLATE 5 MG PO TABS: 5.0000 mg | ORAL_TABLET | Freq: Two times a day (BID) | ORAL | Status: DC

## 2017-06-21 MED ORDER — ATORVASTATIN CALCIUM 20 MG PO TABS
20.0000 mg | ORAL_TABLET | Freq: Every day | ORAL | Status: DC
  Administered 2017-06-21: 20 mg via ORAL
  Filled 2017-06-21: qty 1

## 2017-06-21 MED ORDER — AMLODIPINE BESYLATE 5 MG PO TABS: 5.0000 mg | ORAL_TABLET | Freq: Every day | ORAL | Status: DC

## 2017-06-21 MED ORDER — APIXABAN 2.5 MG PO TABS
2.5000 mg | ORAL_TABLET | Freq: Two times a day (BID) | ORAL | Status: DC
  Administered 2017-06-21 – 2017-06-22 (×3): 2.5 mg via ORAL
  Filled 2017-06-21 (×3): qty 1

## 2017-06-21 MED ORDER — AMLODIPINE BESYLATE 5 MG PO TABS
5.0000 mg | ORAL_TABLET | Freq: Every day | ORAL | Status: DC
  Administered 2017-06-21 – 2017-06-22 (×2): 5 mg via ORAL
  Filled 2017-06-21 (×2): qty 1

## 2017-06-21 MED ORDER — ALLOPURINOL 300 MG PO TABS
150.0000 mg | ORAL_TABLET | Freq: Every day | ORAL | Status: DC
  Administered 2017-06-21 – 2017-06-22 (×2): 150 mg via ORAL
  Filled 2017-06-21 (×2): qty 1

## 2017-06-21 MED ORDER — FUROSEMIDE 10 MG/ML IJ SOLN
20.0000 mg | Freq: Once | INTRAMUSCULAR | Status: AC
  Administered 2017-06-21: 20 mg via INTRAVENOUS
  Filled 2017-06-21: qty 2

## 2017-06-21 MED ORDER — NITROGLYCERIN 0.4 MG SL SUBL: 0.4000 mg | SUBLINGUAL_TABLET | SUBLINGUAL | Status: DC | PRN

## 2017-06-21 NOTE — ED Notes (Signed)
Troponin 0.03. Dr. Florina Ou and primary nurse aware. No orders received at this time.

## 2017-06-21 NOTE — H&P (Signed)
Cardiology Admission History and Physical:   Patient ID: [name]; MRN: [id number]; DOB: Sep 22, 1928   Admission date: 06/20/2017  Primary Care Provider: [name], DO Primary Cardiologist: Johnsie Cancel  Chief Complaint:  Bradycardia  Patient Profile:   [name] is a 84 y.o. male with a history of CAD with prior CABG in 1999 (LIMA to the LAD, SVG to IM, OM1, OM 2, SVG to D1, SVG to PDA/PLA), HFpEF, atrial fibrillation/flutter in 2014 on Eliquis status post DCCV (most recently 03/2017), PVD, claudication, von Willebrand who is admitted for bradycardia.  History of Present Illness:   [name] is a 84 y.o. male with a history of CAD with prior CABG in 1999 (LIMA to the LAD, SVG to IM, OM1, OM 2, SVG to D1, SVG to PDA/PLA), HFpEF, atrial fibrillation/flutter in 2014 on Eliquis status post DCCV (most recently 03/2017), PVD, claudication, von Willebrand who is admitted for bradycardia.  The patient follows with Dr. Johnsie Cancel for his multiple cardiac comorbidities. He has had multiple ED visits over the past several months for volume overload and atrial flutter requiring cardioversion. He was was last seen in clinic in January 2019, at which time he was doing well.   He presented to the [facility] ED after his home heart monitor showed slow and irregular heart rhythm. He has also noticed "skipped beats" that have been more frequent over the past week. He endorses some dyspnea and lightheadedness on standing (increased from his baseline) but denies chest pain. He states that his his LE edema had initially improved since his cardioversion but has recently worsened. His weight has been about 2-5 lbs above his baseline.   At the outside ED, he was noted to be bradycardic to the 30s. ECG showed bradycardia with Wenckebach. Troponin was mildly elevated at 0.03. Hgb 9.4 (recent baseline 10-12). Cr at recent baseline of 1.74. Due to concern for symptomatic bradycardia, transfer to [facility] was  requested.  On arrival to [facility], pt with HR 50s with BP 275/55. ECG showed NSR with first degree AV block. He denies any current chest pain, lightheadedness or dizziness. Review of his telemetry shows HR from 30-60s with frequent Wenckebach episodes and a pause of 2.7 seconds with four nonconducted P waves.    Past Medical History:  Diagnosis Date  . A-fib ([place])   . Basal cell carcinoma    skin  . Bigeminal rhythm   . Cataract   . Chronic renal insufficiency, stage 3 (moderate) (HCC) 2018   GFR 30s-40s  . Coronary artery disease    post bypass  . CVD (cardiovascular disease)   . Diabetes mellitus without complication ([place])   . Diverticular disease   . Easy bruising   . 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 Gout   . Hernia   . Hyperlipidemia   . Hypertension   . Iron deficiency anemia 02/04/2016  . Macular degeneration   . Microscopic colitis   . PVD (peripheral vascular disease) ([place])     Past Surgical History:  Procedure Laterality Date  . CARDIAC CATHETERIZATION  2006  . CARDIOVERSION N/A 02/22/2013   Procedure: CARDIOVERSION;  Surgeon: [name], MD;  Location: [facility] ENDOSCOPY;  Service: Cardiovascular;  Laterality: N/A;  . CARDIOVERSION N/A 11/15/2014   Procedure: CARDIOVERSION;  Surgeon: [name], MD;  Location: [facility] ENDOSCOPY;  Service: Cardiovascular;  Laterality: N/A;  . CARDIOVERSION N/A 04/01/2017   Procedure: CARDIOVERSION;  Surgeon: Larey Dresser, MD;  Location: [facility]  ENDOSCOPY;  Service: Cardiovascular;  Laterality: N/A;  . CAROTID ENDARTERECTOMY  2009/ 1993   left/ right  . CORONARY ARTERY BYPASS GRAFT  1999  . EYE SURGERY     eyelid repair  . INGUINAL HERNIA REPAIR  02/11/2012   Procedure: LAPAROSCOPIC BILATERAL INGUINAL HERNIA REPAIR;  Surgeon: Pedro Earls, MD;  Location: [facility] ORS;  Service: General;  Laterality: Bilateral;  . SKIN CANCER EXCISION     right ear x 3     Medications Prior to Admission: Prior to Admission medications    Medication Sig Start Date End Date Taking? Authorizing Provider  ACCU-CHEK FASTCLIX LANCETS MISC Check fasting blood sugar once daily 05/08/17   Kuneff, Renee A, DO  allopurinol (ZYLOPRIM) 300 MG tablet Take 0.5 tablets (150 mg total) by mouth daily. 03/31/17   Kuneff, Renee A, DO  Aloe Vera 25 MG CAPS Take 50 mg by mouth daily.     [provider]  amLODipine (NORVASC) 2.5 MG tablet Take 1 tablet (2.5 mg total) by mouth 2 (two) times daily. 05/06/17   Kuneff, Renee A, DO  aspirin 81 MG tablet Take 81 mg by mouth at bedtime.     [provider]  atorvastatin (LIPITOR) 20 MG tablet Take 1 tablet (20 mg total) by mouth at bedtime. 01/15/17   Kuneff, Renee A, DO  carvedilol (COREG) 6.25 MG tablet Take 1 tablet (6.25 mg total) by mouth 2 (two) times daily. 04/27/17   [name], MD  cholecalciferol (VITAMIN D) 1000 units tablet Take 1,000 Units by mouth daily.     [provider]  CHROMIUM GTF PO Take 2 capsules by mouth daily.    [provider]  Cinnamon 500 MG TABS Take 2 tablets by mouth at bedtime.     [provider]  Coenzyme Q10 (CO Q 10) 100 MG CAPS Take 100 mg by mouth daily.     [provider]  dicyclomine (BENTYL) 10 MG capsule Take 1 capsule (10 mg total) by mouth 2 (two) times daily. 05/06/17   Kuneff, Renee A, DO  ELIQUIS 2.5 MG TABS tablet TAKE 1 TABLET TWICE A DAY 08/12/16   [name], MD  fish oil-omega-3 fatty acids 1000 MG capsule Take 1 g by mouth daily.     [provider]  furosemide (LASIX) 20 MG tablet Take 1 tablet (20 mg total) by mouth as directed. Take 2 tabs daily for 3 days then change to 1 tab daily 03/24/17 06/22/17  Imogene Burn, PA-C  glucose blood (ACCU-CHEK GUIDE) test strip Check fasting blood sugar once daily 05/08/17   Kuneff, Renee A, DO  Multiple Vitamins-Minerals (PRESERVISION AREDS PO) Take 1 tablet by mouth 2 (two) times daily.     [provider]  Probiotic Product (PROBIOTIC  PO) Take 1 tablet by mouth daily.    [provider]  tamsulosin (FLOMAX) 0.4 MG CAPS capsule TAKE 1 CAPSULE BY MOUTH ONCE DAILY AFTER SUPPER 03/23/17   Kuneff, Renee A, DO  VANADYL SULFATE PO Take 1 capsule by mouth daily.    [provider]  zinc gluconate 50 MG tablet Take 50 mg by mouth daily.     [provider]     Allergies:    Allergies  Allergen Reactions  . Penicillins Hives    Has patient had a PCN reaction causing immediate rash, facial/tongue/throat swelling, SOB or lightheadedness with hypotension: No Has patient had a PCN reaction causing severe rash involving mucus membranes or  skin necrosis: Yes Has patient had a PCN reaction that required hospitalization: No Has patient had a PCN reaction occurring within the last 10 years: No If all of the above answers are "NO", then may proceed with Cephalosporin use.     Social History:   Social History   Socioeconomic History  . Marital status: Married    Spouse name: Not on file  . Number of children: Not on file  . Years of education: Not on file  . Highest education level: Not on file  Social Needs  . Financial resource strain: Not on file  . Food insecurity - worry: Not on file  . Food insecurity - inability: Not on file  . Transportation needs - medical: Not on file  . Transportation needs - non-medical: Not on file  Occupational History  . Occupation: retired  Tobacco Use  . Smoking status: Former Smoker    Last attempt to quit: 12/25/1961    Years since quitting: 55.5  . Smokeless tobacco: Former Systems developer    Types: [place] date: 02/03/1969  Substance and Sexual Activity  . Alcohol use: No    Alcohol/week: 0.0 oz  . Drug use: No  . Sexual activity: No  Other Topics Concern  . Not on file  Social History Narrative   Married to Arabi, 2 children [name] and [name].   Some college. Retired from [employer].   Drinks caffeine, uses herbal remedies, takes a daily vitamin.    Wears his seatbelt, smoke detector at home, firearms in the home.   Wears a hearing aid.   Feels safe in her relationships.    Family History:  The patient's family history includes Coronary artery disease in his father; Heart disease in his father; Melanoma in his sister; Stroke in his mother.    ROS:  Please see the history of present illness.  All other ROS reviewed and negative.     Physical Exam/Data:   Vitals:   06/21/17 0000 06/21/17 0030 06/21/17 0100 06/21/17 0315  BP: (!) 143/63 (!) 156/53 (!) 155/59 (!) 175/55  Pulse: (!) 34 (!) 33 (!) 53 72  Resp: 12 13 12 18   Temp:    (!) 97.5 F (36.4 C)  TempSrc:    Oral  SpO2: 97% 96% 96% 99%  Weight:    69.1 kg (152 lb 6 oz)  Height:    5\' 8"  (1.727 m)    Intake/Output Summary (Last 24 hours) at 06/21/2017 0430 Last data filed at 06/21/2017 0348 Gross per 24 hour  Intake 120 ml  Output -  Net 120 ml   Filed Weights   06/20/17 2214 06/21/17 0315  Weight: 68 kg (150 lb) 69.1 kg (152 lb 6 oz)   Body mass index is 23.17 kg/m.  General:  Well nourished, well developed, in no acute distress  HEENT: normal Lymph: no adenopathy Neck: JVD at upper neck Cardiac:  Slow and regular; II/VI systolic murmur Lungs:  clear to auscultation bilaterally, no wheezing, rhonchi or rales  Abd: soft, nontender, no hepatomegaly  Ext: 1+ pitting LE edema Musculoskeletal:  No deformities, BUE and BLE strength normal and equal Skin: warm and dry  Neuro:  No focal abnormalities noted Psych:  Normal affect    EKG:  The ECG that was done and was personally reviewed and demonstrates NSR with first degree AV block and nonspecific ST changes  Relevant CV Studies: TTE 03/2017: - Left ventricle: The cavity size was normal. There was mild  concentric hypertrophy. Systolic function was normal. The   estimated ejection fraction was in the range of 55% to 60%. Wall   motion was normal; there were no regional wall motion   abnormalities. Features  are consistent with a pseudonormal left   ventricular filling pattern, with concomitant abnormal relaxation   and increased filling pressure (grade 2 diastolic dysfunction). - Ventricular septum: Septal motion showed paradox. These changes   are consistent with a post-thoracotomy state. - Mitral valve: There was mild to moderate regurgitation directed   centrally. Diastolic regurgitation was present. - Left atrium: The atrium was moderately dilated. - Right atrium: The atrium was mildly dilated. - Pulmonary arteries: PA peak pressure: 32 mm Hg (S).  [place] 2011: Patent grafts  Laboratory Data:  Chemistry Recent Labs  Lab 06/20/17 2319  NA 140  K 3.9  CL 110  CO2 19*  GLUCOSE 128*  BUN 45*  CREATININE 1.74*  CALCIUM 8.7*  GFRNONAA 34*  GFRAA 39*  ANIONGAP 11    No results for input(s): PROT, ALBUMIN, AST, ALT, ALKPHOS, BILITOT in the last 168 hours. Hematology Recent Labs  Lab 06/20/17 2319  WBC 7.3  RBC 3.12*  HGB 9.4*  HCT 28.4*  MCV 91.0  MCH 30.1  MCHC 33.1  RDW 15.0  PLT 167   Cardiac Enzymes Recent Labs  Lab 06/20/17 2319  TROPONINI 0.03*   No results for input(s): TROPIPOC in the last 168 hours.  BNPNo results for input(s): BNP, PROBNP in the last 168 hours.  DDimer No results for input(s): DDIMER in the last 168 hours.  Radiology/Studies:  Dg Chest 2 View  Result Date: 06/20/2017 CLINICAL DATA:  Irregular heart rate, shortness of breath and palpitations for several days. EXAM: CHEST  2 VIEW COMPARISON:  03/17/2017 and prior radiographs FINDINGS: Cardiomegaly and CABG changes again noted. There is no evidence of focal airspace disease, pulmonary edema, suspicious pulmonary nodule/mass, pleural effusion, or pneumothorax. No acute bony abnormalities are identified. IMPRESSION: Cardiomegaly without evidence of acute cardiopulmonary disease. Electronically Signed   By: Margarette Canada M.D.   On: 06/20/2017 22:41    Assessment and Plan:   Bradycardia Atrial  fibrillation/flutter The patient is admitted to [facility] for bradycardia, with telemetry showing HR in the 30s with frequent Wenckebach and at least one episode of multiple non-conducted P waves. He has some increased lightheadedness, although this is mostly with postural changes. It is therefore unclear to what degree he is symptomatic with this bradycardia. However, of concern is his history of atrial arrhythmias, which raises possibility of tachy-brady syndrome, although it appears that his HR is generally slow, even at the time of pre-DCCV fib/flutter. He is on carvedilol, but query whether he needs this nodal blockade. If he does show signs of further symptomatic bradycardia and/or tachy-brady syndrome, he may require PPM. At this time, will hold carvedilol and continue to monitor HR. -Continue to monitor on telemetry -Holding carvedilol -Continue home apixaban  HFpEF The patient reports slight increase in his weight and LE edema recently, despite compliance with his home furosemide. He appears mildly hypervolemic on exam. -Giving IV furosemide x1. Can re-dose vs transition to home oral dose based on clinical response.  Positive troponin The patient is found to have very modestly elevated troponin at the outside ED. He reports no chest pain. ECG shows nonspecific ST changes. Suspect this finding to therefore be due to demand rather than ACS. -Continue to monitor symptoms -Continue to trend troponin.  CAD s/p CABG HLD  As above, low suspicion for any ACS event. -Continue home ASA, atorvastatin   HTN BP elevated on arrival to [facility] -Continue home amlodipine, and giving single dose of IV furosemide  -Holding carvedilol as above -Can adjust therapy as needed.  Anemia The patient's Hgb was 9.4 at the outside ED, down from his baseline of ~12. He reports minor arm bruising but denies other bleeding. Unclear what has led to this drop. -Will repeat with labs here. Can work up further  if persistently low.   CKD Cr at baseline -Continue to monitor   BPH -Continue tamsulosin  DM2 -Currently diet controlled -Continue to monitor. SSI ordered  Gout -Continue allopurinol  Severity of Illness: The appropriate patient status for this patient is OBSERVATION. Observation status is judged to be reasonable and necessary in order to provide the required intensity of service to ensure the patient's safety. The patient's presenting symptoms, physical exam findings, and initial radiographic and laboratory data in the context of their medical condition is felt to place them at decreased risk for further clinical deterioration. Furthermore, it is anticipated that the patient will be medically stable for discharge from the hospital within 2 midnights of admission. The following factors support the patient status of observation.   " The patient's presenting symptoms include bradycardia. " The physical exam findings include bradycardia. " The initial radiographic and laboratory data are slow HR.     For questions or updates, please contact [place] Please consult [email or web address] for contact info under Cardiology/STEMI.    Signed, Nila Nephew, MD  06/21/2017 4:30 AM

## 2017-06-21 NOTE — Progress Notes (Signed)
Patient arrived from [facility]. Patient stable. Paged cardiology on call Nila Nephew for order.  Will continue to monitor.  Shanequa Whitenight, RN

## 2017-06-21 NOTE — Progress Notes (Signed)
Progress Note  Patient Name: [name] Date of Encounter: 06/21/2017  Primary Cardiologist: Jenkins Rouge, MD   Subjective   Feeling much better. Denies chest pain, dizziness, and shortness of breath.  Inpatient Medications    Scheduled Meds: . allopurinol  150 mg Oral Daily  . amLODipine  5 mg Oral Daily  . apixaban  2.5 mg Oral BID  . aspirin EC  81 mg Oral QHS  . atorvastatin  20 mg Oral QHS  . cholecalciferol  1,000 Units Oral Daily  . insulin aspart  0-9 Units Subcutaneous TID WC  . tamsulosin  0.4 mg Oral Daily   Continuous Infusions:  PRN Meds: nitroGLYCERIN   Vital Signs    Vitals:   06/21/17 0315 06/21/17 0625 06/21/17 0805 06/21/17 0945  BP: (!) 175/55 (!) 140/44 (!) 138/48 (!) 180/56  Pulse: 72 69 72 61  Resp: 18 18 18    Temp: (!) 97.5 F (36.4 C) (!) 97.5 F (36.4 C) 97.6 F (36.4 C)   TempSrc: Oral Oral Oral   SpO2: 99% 98% 98% 100%  Weight: 152 lb 6 oz (69.1 kg)     Height: 5\' 8"  (1.727 m)       Intake/Output Summary (Last 24 hours) at 06/21/2017 1048 Last data filed at 06/21/2017 4580 Gross per 24 hour  Intake 360 ml  Output 500 ml  Net -140 ml   Filed Weights   06/20/17 2214 06/21/17 0315  Weight: 150 lb (68 kg) 152 lb 6 oz (69.1 kg)    Telemetry    Sinus brady/sinus rhythm with Mobitz I - Personally Reviewed  ECG    Sinus brady with 1st degree AV block - Personally Reviewed  Physical Exam   GEN: No acute distress.   Neck: No JVD Cardiac: RRR, no murmurs, rubs, or gallops.  Respiratory: Clear to auscultation bilaterally. GI: Soft, nontender, non-distended  MS: No edema; No deformity. Neuro:  Nonfocal  Psych: Normal affect   Labs    Chemistry Recent Labs  Lab 06/20/17 2319 06/21/17 0420  NA 140 139  K 3.9 3.9  CL 110 109  CO2 19* 21*  GLUCOSE 128* 100*  BUN 45* 39*  CREATININE 1.74* 1.69*  CALCIUM 8.7* 8.6*  GFRNONAA 34* 35*  GFRAA 39* 41*  ANIONGAP 11 9     Hematology Recent Labs  Lab  06/20/17 2319 06/21/17 0420  WBC 7.3 6.3  RBC 3.12* 3.34*  HGB 9.4* 9.7*  HCT 28.4* 30.1*  MCV 91.0 90.1  MCH 30.1 29.0  MCHC 33.1 32.2  RDW 15.0 15.5  PLT 167 163    Cardiac Enzymes Recent Labs  Lab 06/20/17 2319 06/21/17 0420  TROPONINI 0.03* <0.03   No results for input(s): TROPIPOC in the last 168 hours.   BNP Recent Labs  Lab 06/21/17 0420  BNP 468.6*     DDimer No results for input(s): DDIMER in the last 168 hours.   Radiology    Dg Chest 2 View  Result Date: 06/20/2017 CLINICAL DATA:  Irregular heart rate, shortness of breath and palpitations for several days. EXAM: CHEST  2 VIEW COMPARISON:  03/17/2017 and prior radiographs FINDINGS: Cardiomegaly and CABG changes again noted. There is no evidence of focal airspace disease, pulmonary edema, suspicious pulmonary nodule/mass, pleural effusion, or pneumothorax. No acute bony abnormalities are identified. IMPRESSION: Cardiomegaly without evidence of acute cardiopulmonary disease. Electronically Signed   By: Margarette Canada M.D.   On: 06/20/2017 22:41    Cardiac Studies   Echo  04/03/17  - Left ventricle: The cavity size was normal. There was mild   concentric hypertrophy. Systolic function was normal. The   estimated ejection fraction was in the range of 55% to 60%. Wall   motion was normal; there were no regional wall motion   abnormalities. Features are consistent with a pseudonormal left   ventricular filling pattern, with concomitant abnormal relaxation   and increased filling pressure (grade 2 diastolic dysfunction). - Ventricular septum: Septal motion showed paradox. These changes   are consistent with a post-thoracotomy state. - Mitral valve: There was mild to moderate regurgitation directed   centrally. Diastolic regurgitation was present. - Left atrium: The atrium was moderately dilated. - Right atrium: The atrium was mildly dilated. - Pulmonary arteries: PA peak pressure: 32 mm Hg (S).  Patient Profile      84 y.o. male with a history of CAD with prior CABG in 1999 (LIMA to the LAD, SVG to IM, OM1, OM 2, SVG to D1, SVG to PDA/PLA), HFpEF, atrial fibrillation/flutter in 2014 on Eliquis status post DCCV (most recently 03/2017), PVD, claudication, von Willebrand who is admitted for bradycardia.  Assessment & Plan    1. Bradycardia with h/o long-standing persistent atrial fibrillation/flutter and sinus bradycardia with intermittent Mobitz I block with non-conducted P waves intermittently: Coreg on hold. Symptomatically improved. If he does show signs of further symptomatic bradycardia and/or tachy-brady syndrome, he may require PPM. At this time, will hold carvedilol and continue to monitor HR. Continue apixaban.  2. Chronic diastolic heart failure: Symptomatically improved after one dose of IV Lasix 20 mg. Will resume oral Lasix 20 mg daily. BNP 486.  3. CAD: Symptomatically stable. Continue ASA and Lipitor. Coreg on hold. Troponins are nonspecifically elevated and not concerning for ACS.  4. Hypertension: BP is elevated (SBP 180 mmHg). I will increase amlodipine to 5 mg daily.  5. CKD stage III: BUN down to 39, creatinine down to 1.69.  6. Hyperlipidemia: LDL 55. Continue statin.  7. PVD: On ASA and statin.  8. Anemia: Hgb 9.7. Had been 12.3 on 05/21/17. Will need further evaluation in outpatient setting.    For questions or updates, please contact [place] Please consult [email or web address] for contact info under Cardiology/STEMI.      Signed, Kate Sable, MD  06/21/2017, 10:48 AM

## 2017-06-22 ENCOUNTER — Other Ambulatory Visit: Payer: Self-pay | Admitting: Medical

## 2017-06-22 DIAGNOSIS — I441 Atrioventricular block, second degree: Secondary | ICD-10-CM

## 2017-06-22 LAB — HEMOGLOBIN A1C
HEMOGLOBIN A1C: 5.7 % — AB (ref 4.8–5.6)
MEAN PLASMA GLUCOSE: 117 mg/dL

## 2017-06-22 LAB — GLUCOSE, CAPILLARY
GLUCOSE-CAPILLARY: 109 mg/dL — AB (ref 65–99)
GLUCOSE-CAPILLARY: 99 mg/dL (ref 65–99)

## 2017-06-22 MED ORDER — AMLODIPINE BESYLATE 5 MG PO TABS: 5.0000 mg | ORAL_TABLET | Freq: Every day | ORAL | 3 refills | Status: DC

## 2017-06-22 MED ORDER — FUROSEMIDE 20 MG PO TABS: 20.0000 mg | ORAL_TABLET | ORAL | 3 refills | Status: DC

## 2017-06-22 MED ORDER — DARBEPOETIN ALFA 300 MCG/0.6ML IJ SOSY
300.0000 ug | PREFILLED_SYRINGE | Freq: Once | INTRAMUSCULAR | Status: AC
  Administered 2017-06-22: 300 ug via SUBCUTANEOUS
  Filled 2017-06-22: qty 0.6

## 2017-06-22 NOTE — Progress Notes (Signed)
Progress Note  Patient Name: [name] Date of Encounter: 06/22/2017  Primary Cardiologist: Jenkins Rouge, MD   Subjective   Feels great - wants to go home. Still noting skipped beats.  Inpatient Medications    Scheduled Meds: . allopurinol  150 mg Oral Daily  . amLODipine  5 mg Oral Daily  . apixaban  2.5 mg Oral BID  . aspirin EC  81 mg Oral QHS  . atorvastatin  20 mg Oral QHS  . cholecalciferol  1,000 Units Oral Daily  . darbepoetin (ARANESP) injection - NON-DIALYSIS  300 mcg Subcutaneous Once  . furosemide  20 mg Oral Daily  . insulin aspart  0-9 Units Subcutaneous TID WC  . tamsulosin  0.4 mg Oral Daily   Continuous Infusions:  PRN Meds: nitroGLYCERIN   Vital Signs    Vitals:   06/21/17 1702 06/21/17 1930 06/22/17 0500 06/22/17 0628  BP: (!) 166/58 (!) 173/58  (!) 157/48  Pulse: 62 72  75  Resp: 18 17  18   Temp: 98 F (36.7 C) 97.6 F (36.4 C)  98 F (36.7 C)  TempSrc: Oral Oral  Oral  SpO2: 98% 100%  96%  Weight:   147 lb 4.8 oz (66.8 kg)   Height:        Intake/Output Summary (Last 24 hours) at 06/22/2017 0946 Last data filed at 06/22/2017 0231 Gross per 24 hour  Intake 480 ml  Output 2750 ml  Net -2270 ml   Filed Weights   06/20/17 2214 06/21/17 0315 06/22/17 0500  Weight: 150 lb (68 kg) 152 lb 6 oz (69.1 kg) 147 lb 4.8 oz (66.8 kg)    Telemetry    Sinus brady/sinus rhythm with Mobitz I - Personally Reviewed  ECG    Sinus brady with 1st degree AV block - Personally Reviewed  Physical Exam  No interval changes on exam  GEN: No acute distress.   Neck: No JVD Cardiac: RRR, no murmurs, rubs, or gallops.  Respiratory: Clear to auscultation bilaterally. GI: Soft, nontender, non-distended  MS: No edema; No deformity. Neuro:  Nonfocal  Psych: Normal affect   Labs    Chemistry Recent Labs  Lab 06/20/17 2319 06/21/17 0420  NA 140 139  K 3.9 3.9  CL 110 109  CO2 19* 21*  GLUCOSE 128* 100*  BUN 45* 39*  CREATININE 1.74*  1.69*  CALCIUM 8.7* 8.6*  GFRNONAA 34* 35*  GFRAA 39* 41*  ANIONGAP 11 9     Hematology Recent Labs  Lab 06/20/17 2319 06/21/17 0420  WBC 7.3 6.3  RBC 3.12* 3.34*  HGB 9.4* 9.7*  HCT 28.4* 30.1*  MCV 91.0 90.1  MCH 30.1 29.0  MCHC 33.1 32.2  RDW 15.0 15.5  PLT 167 163    Cardiac Enzymes Recent Labs  Lab 06/20/17 2319 06/21/17 0420 06/21/17 0955 06/21/17 1537  TROPONINI 0.03* <0.03 <0.03 <0.03   No results for input(s): TROPIPOC in the last 168 hours.   BNP Recent Labs  Lab 06/21/17 0420  BNP 468.6*     DDimer No results for input(s): DDIMER in the last 168 hours.   Radiology    Dg Chest 2 View  Result Date: 06/20/2017 CLINICAL DATA:  Irregular heart rate, shortness of breath and palpitations for several days. EXAM: CHEST  2 VIEW COMPARISON:  03/17/2017 and prior radiographs FINDINGS: Cardiomegaly and CABG changes again noted. There is no evidence of focal airspace disease, pulmonary edema, suspicious pulmonary nodule/mass, pleural effusion, or pneumothorax. No acute bony  abnormalities are identified. IMPRESSION: Cardiomegaly without evidence of acute cardiopulmonary disease. Electronically Signed   By: Margarette Canada M.D.   On: 06/20/2017 22:41    Cardiac Studies   Echo 04/03/17  - Left ventricle: The cavity size was normal. There was mild   concentric hypertrophy. Systolic function was normal. The   estimated ejection fraction was in the range of 55% to 60%. Wall   motion was normal; there were no regional wall motion   abnormalities. Features are consistent with a pseudonormal left   ventricular filling pattern, with concomitant abnormal relaxation   and increased filling pressure (grade 2 diastolic dysfunction). - Ventricular septum: Septal motion showed paradox. These changes   are consistent with a post-thoracotomy state. - Mitral valve: There was mild to moderate regurgitation directed   centrally. Diastolic regurgitation was present. - Left atrium:  The atrium was moderately dilated. - Right atrium: The atrium was mildly dilated. - Pulmonary arteries: PA peak pressure: 32 mm Hg (S).  Patient Profile     84 y.o. male with a history of CAD with prior CABG in 1999 (LIMA to the LAD, SVG to IM, OM1, OM 2, SVG to D1, SVG to PDA/PLA), HFpEF, atrial fibrillation/flutter in 2014 on Eliquis status post DCCV (most recently 03/2017), PVD, claudication, von Willebrand who is admitted for bradycardia.  Assessment & Plan    1. Bradycardia with h/o long-standing persistent atrial fibrillation/flutter and sinus bradycardia with intermittent Mobitz I block with non-conducted P waves intermittently: Coreg on hold. Symptomatically improved. If he does show signs of further symptomatic bradycardia and/or tachy-brady syndrome, he may require PPM. At this time, will hold carvedilol and continue to monitor HR. Continue apixaban.  2. Chronic diastolic heart failure: Symptomatically improved after one dose of IV Lasix 20 mg. Will resume oral Lasix 20 mg daily. BNP 486.  3. CAD: Symptomatically stable. Continue ASA and Lipitor. Coreg on hold. Troponins are nonspecifically elevated and not concerning for ACS.  4. Hypertension: BP is elevated (SBP 180 mmHg). I will increase amlodipine to 5 mg daily.  5. CKD stage III: BUN down to 39, creatinine down to 1.69.  6. Hyperlipidemia: LDL 55. Continue statin.  7. PVD: On ASA and statin.  8. Anemia: Hgb 9.7. Had been 12.3 on 05/21/17. Will need further evaluation in outpatient setting.  Stable overnight -HR improved, however, still has persistent Wenkebach. Love for d/c hjopme today off AVN blockers. Will arrange for outpatient 2 week monitor. Follow-up with Dr. Johnsie Cancel afterwards. Amlodipine added for BP yesterday - monitor and consider increased dose as outpatient (has not had enough doses for maximal effect). Will need Rx at discharge.  For questions or updates, please contact [place] Please consult [email or web address]  for contact info under Cardiology/STEMI.     Pixie Casino, MD, [facility], [place] Director of the Advanced Lipid Disorders &  Cardiovascular Risk Reduction Clinic Diplomate of the American Board of Clinical Lipidology Attending Cardiologist  Direct Dial: [phone]  Fax: [phone]  Website:  [email or web address]  Pixie Casino, MD  06/22/2017, 9:46 AM

## 2017-06-22 NOTE — Progress Notes (Signed)
Pt got discharged to home, discharge instructions provided and patient showed understanding to it, IV taken out,Telemonitor DC,pt left unit in wheelchair with all of the belongings accompanied with a family member (Son)  Biddie Sebek, RN 

## 2017-06-22 NOTE — Discharge Summary (Signed)
Discharge Summary    Patient ID: [name],  MRN: [id number], DOB/AGE: 06-11-1928 84 y.o.  Admit date: 06/20/2017 Discharge date: 06/22/2017  Primary Care Provider: Howard Pouch A Primary Cardiologist: Jenkins Rouge, MD  Discharge Diagnoses    Active Problems:   PVD (peripheral vascular disease) ([place])   Anemia   Mobitz (type) I (Wenckebach's) atrioventricular block   Bradycardia   Elevated troponin I level   Chronic diastolic CHF (congestive heart failure) ([place])   Coronary artery disease of bypass graft of native heart with stable angina pectoris ([place])   Other hyperlipidemia   Allergies Allergies  Allergen Reactions  . Penicillins Hives    Has patient had a PCN reaction causing immediate rash, facial/tongue/throat swelling, SOB or lightheadedness with hypotension: No Has patient had a PCN reaction causing severe rash involving mucus membranes or skin necrosis: Yes Has patient had a PCN reaction that required hospitalization: No Has patient had a PCN reaction occurring within the last 10 years: No If all of the above answers are "NO", then may proceed with Cephalosporin use.     Diagnostic Studies/Procedures    None _____________   History of Present Illness     [name] is a 84 y.o. male with a history of CAD with prior CABG in 1999 (LIMA to the LAD, SVG to IM, OM1, OM 2, SVG to D1, SVG to PDA/PLA), HFpEF, atrial fibrillation/flutter in 2014 on Eliquis status post DCCV (most recently 03/2017), PVD, claudication, von Willebrand who is admitted for bradycardia.  The patient follows with Dr. Johnsie Cancel for his multiple cardiac comorbidities. He has had multiple ED visits over the past several months for volume overload and atrial flutter requiring cardioversion. He was was last seen in clinic in January 2019, at which time he was doing well.   He presented to the [facility] ED after his home heart monitor showed slow and irregular heart rhythm. He has also noticed  "skipped beats" that have been more frequent over the past week. He endorses some dyspnea and lightheadedness on standing (increased from his baseline) but denies chest pain. He states that his his LE edema had initially improved since his cardioversion but has recently worsened. His weight has been about 2-5 lbs above his baseline.   At the outside ED, he was noted to be bradycardic to the 30s. ECG showed bradycardia with Wenckebach. Troponin was mildly elevated at 0.03. Hgb 9.4 (recent baseline 10-12). Cr at recent baseline of 1.74. Due to concern for symptomatic bradycardia, transfer to [facility] was requested.  On arrival to [facility], pt with HR 50s with BP 275/55. ECG showed NSR with first degree AV block. He denies any current chest pain, lightheadedness or dizziness. Review of his telemetry shows HR from 30-60s with frequent Wenckebach episodes and a pause of 2.7 seconds with four nonconducted P waves.     Hospital Course     Consultants: None   1. Bradycardia: history of long standing persistent atrial fibrillation/flutter and sinus bradycardia with intermittent Mobitz I block with non- conducted P waves intermittently. Beta blocker (Coreg) was discontinued and patient symptomatically improved.  - 2 week event monitor ordered at discharge. To follow-up with Dr. Johnsie Cancel afterwards for further management. - If further symptomatic bradycardia and/or tachy-brady syndrome, may require PPM.   2. Persistent atrial fibrillation: rate maintained in the 60s-70s off coreg.  - Continue apixaban  3. Chronic diastolic heart failure: BNP 486. CXR without pulmonary edema. Noted to have increase  in weight and LE edema. Received IV lasix 20mg  x1 dose with improvement.  - Continue po lasix 20 mg at discharge  4. CAD: symptomatically stable. Troponins 0.03 to <0.03 not concerning for ACS - Continue ASA and lipitor  5. HTN: BP elevated with SBP 180s. Amlodipine increased to 5mg  daily - Consider  uptitrating outpatient if needed  6. CKD stage III: Cr at baseline; 1.69 on discharge - Continue to dose adjust medications as needed  7. Dyslipidemia: LDL 55 - Continue statin  8. PVD: stable - Continue ASA and statin  9. Anemia: Hbg 9.7 at discharge, previously 12.3 05/21/17. No obvious signs of bleeding. - Recommend outpatient work-up with PCP   _____________  Discharge Vitals Blood pressure (!) 171/66, pulse 73, temperature 98 F (36.7 C), temperature source Oral, resp. rate 18, height 5\' 8"  (1.727 m), weight 147 lb 4.8 oz (66.8 kg), SpO2 99 %.  Filed Weights   06/20/17 2214 06/21/17 0315 06/22/17 0500  Weight: 150 lb (68 kg) 152 lb 6 oz (69.1 kg) 147 lb 4.8 oz (66.8 kg)    Labs & Radiologic Studies    CBC Recent Labs    06/20/17 2319 06/21/17 0420  WBC 7.3 6.3  NEUTROABS 3.8  --   HGB 9.4* 9.7*  HCT 28.4* 30.1*  MCV 91.0 90.1  PLT 167 979   Basic Metabolic Panel Recent Labs    06/20/17 2319 06/21/17 0420  NA 140 139  K 3.9 3.9  CL 110 109  CO2 19* 21*  GLUCOSE 128* 100*  BUN 45* 39*  CREATININE 1.74* 1.69*  CALCIUM 8.7* 8.6*   Liver Function Tests No results for input(s): AST, ALT, ALKPHOS, BILITOT, PROT, ALBUMIN in the last 72 hours. No results for input(s): LIPASE, AMYLASE in the last 72 hours. Cardiac Enzymes Recent Labs    06/21/17 0420 06/21/17 0955 06/21/17 1537  TROPONINI <0.03 <0.03 <0.03   BNP Invalid input(s): POCBNP D-Dimer No results for input(s): DDIMER in the last 72 hours. Hemoglobin A1C Recent Labs    06/21/17 0420  HGBA1C 5.7*   Fasting Lipid Panel Recent Labs    06/21/17 0420  CHOL 110  HDL 46  LDLCALC 55  TRIG 44  CHOLHDL 2.4   Thyroid Function Tests No results for input(s): TSH, T4TOTAL, T3FREE, THYROIDAB in the last 72 hours.  Invalid input(s): FREET3 _____________  Dg Chest 2 View  Result Date: 06/20/2017 CLINICAL DATA:  Irregular heart rate, shortness of breath and palpitations for several days. EXAM:  CHEST  2 VIEW COMPARISON:  03/17/2017 and prior radiographs FINDINGS: Cardiomegaly and CABG changes again noted. There is no evidence of focal airspace disease, pulmonary edema, suspicious pulmonary nodule/mass, pleural effusion, or pneumothorax. No acute bony abnormalities are identified. IMPRESSION: Cardiomegaly without evidence of acute cardiopulmonary disease. Electronically Signed   By: Margarette Canada M.D.   On: 06/20/2017 22:41   Disposition   Patient was seen and examined by Dr. Debara Pickett who deemed patient as stable for discharge. Follow-up has been arranged. Discharge medications as listed below.   Follow-up Plans & Appointments    Follow-up Information    [name], MD Follow up on 07/28/2017.   Specialty:  Cardiology Why:  Please arrive 15 minutes early for your 8:45 am appointment Contact information: [address] [address] [phone]        Event Monitor Appointment-Cardiology Follow up on 07/01/2017.   Why:  Please arrive 15 minutes early for your 11am appointment. Your heart  monitor will be placed at this appointment. Contact information: [facility] [address] [address]           Discharge Medications   Allergies as of 06/22/2017      Reactions   Penicillins Hives   Has patient had a PCN reaction causing immediate rash, facial/tongue/throat swelling, SOB or lightheadedness with hypotension: No Has patient had a PCN reaction causing severe rash involving mucus membranes or skin necrosis: Yes Has patient had a PCN reaction that required hospitalization: No Has patient had a PCN reaction occurring within the last 10 years: No If all of the above answers are "NO", then may proceed with Cephalosporin use.      Medication List    STOP taking these medications   carvedilol 6.25 MG tablet Commonly known as:  COREG     TAKE these medications   ACCU-CHEK FASTCLIX LANCETS Misc Check  fasting blood sugar once daily   allopurinol 300 MG tablet Commonly known as:  ZYLOPRIM Take 0.5 tablets (150 mg total) by mouth daily.   Aloe Vera 25 MG Caps Take 50 mg by mouth daily.   amLODipine 5 MG tablet Commonly known as:  NORVASC Take 1 tablet (5 mg total) by mouth daily. Start taking on:  06/23/2017 What changed:    medication strength  how much to take  when to take this   aspirin 81 MG tablet Take 81 mg by mouth at bedtime.   atorvastatin 20 MG tablet Commonly known as:  LIPITOR Take 1 tablet (20 mg total) by mouth at bedtime.   cholecalciferol 1000 units tablet Commonly known as:  VITAMIN D Take 1,000 Units by mouth daily.   CHROMIUM GTF PO Take 2 capsules by mouth daily.   Cinnamon 500 MG Tabs Take 2 tablets by mouth at bedtime.   Co Q 10 100 MG Caps Take 100 mg by mouth daily.   dicyclomine 10 MG capsule Commonly known as:  BENTYL Take 1 capsule (10 mg total) by mouth 2 (two) times daily.   ELIQUIS 2.5 MG Tabs tablet Generic drug:  apixaban TAKE 1 TABLET TWICE A DAY   fish oil-omega-3 fatty acids 1000 MG capsule Take 1 g by mouth daily.   furosemide 20 MG tablet Commonly known as:  LASIX Take 1 tablet (20 mg total) by mouth as directed. What changed:  additional instructions   glucose blood test strip Commonly known as:  ACCU-CHEK GUIDE Check fasting blood sugar once daily   PRESERVISION AREDS PO Take 1 tablet by mouth 2 (two) times daily.   PROBIOTIC PO Take 1 tablet by mouth daily.   tamsulosin 0.4 MG Caps capsule Commonly known as:  FLOMAX TAKE 1 CAPSULE BY MOUTH ONCE DAILY AFTER SUPPER   VANADYL SULFATE PO Take 1 capsule by mouth daily.   zinc gluconate 50 MG tablet Take 50 mg by mouth daily.       Outstanding Labs/Studies   2 Week event monitor ordered. Patient to present to [facility] office 07/01/17 for placement of heart monitor. Will follow-up with Dr. Johnsie Cancel after completion of test to review the results.    Duration of Discharge Encounter   Greater than 30 minutes including physician time.  Signed, Abigail Butts PA-C 06/22/2017, 11:27 AM

## 2017-07-01 ENCOUNTER — Ambulatory Visit (INDEPENDENT_AMBULATORY_CARE_PROVIDER_SITE_OTHER): Payer: Medicare Other

## 2017-07-01 DIAGNOSIS — I441 Atrioventricular block, second degree: Secondary | ICD-10-CM | POA: Diagnosis not present

## 2017-07-06 ENCOUNTER — Telehealth: Payer: Self-pay | Admitting: *Deleted

## 2017-07-06 NOTE — Telephone Encounter (Signed)
Called pt about [employer] monitor report from 2:00 am on 07/02/17.  (this reported was handed to me this morning, faxed to our office after 5pm last Friday) Pt sleeping at the time of occurrence.  Denies SOB, dizziness, syncope. Reports that HRs averaging in the 50s.   Associated symptoms include slight fatigue and he tires easily. Reviewed with Dr. Johnsie Cancel who recommends pt be seen by EP to discuss further. Melissa will call patient to arrange consult.  Pt is agreeable.  Pt also reports increased BP since stopping BB.  He was taking Carvedilol 6.25 mg BID - this was stopped secondary to bradycardia.  His Amlodipine was doubled to 5 mg when carvedilol was stopped.   Reports SBPs avg 160s every morning before medication.  SBP avg 150s the remainder of the day.  Will forward to Dr. Johnsie Cancel and his nurse to address BP control.  Pt understands nurse will call him once advised upon.

## 2017-07-08 DIAGNOSIS — E1151 Type 2 diabetes mellitus with diabetic peripheral angiopathy without gangrene: Secondary | ICD-10-CM | POA: Diagnosis not present

## 2017-07-08 DIAGNOSIS — I739 Peripheral vascular disease, unspecified: Secondary | ICD-10-CM | POA: Diagnosis not present

## 2017-07-08 DIAGNOSIS — L603 Nail dystrophy: Secondary | ICD-10-CM | POA: Diagnosis not present

## 2017-07-08 DIAGNOSIS — L84 Corns and callosities: Secondary | ICD-10-CM | POA: Diagnosis not present

## 2017-07-08 MED ORDER — AMLODIPINE BESYLATE 5 MG PO TABS: 5.0000 mg | ORAL_TABLET | Freq: Every day | ORAL | 3 refills | Status: DC

## 2017-07-08 NOTE — Telephone Encounter (Signed)
Patient given recommendations from Ermalinda Barrios PA. Patient verbalized understanding.

## 2017-07-08 NOTE — Telephone Encounter (Signed)
Pam can you let patient know I would tolerate a little bit higher blood pressure at this time.  He has had trouble with lisinopril in the past causing high potassium and we have to avoid all rate lowering medications.  I do not want to increase his amlodipine or Lasix further.  Asked him to be on a strict 2 g sodium diet and only take his blood pressures about an hour and a half after he takes his medication no more than twice a day.  Dr. Rayann Heman can assess when he sees him on the 28th.  If it continues to be high we can try Imdur 15 mg daily.

## 2017-07-16 ENCOUNTER — Ambulatory Visit: Payer: Medicare Other

## 2017-07-16 ENCOUNTER — Other Ambulatory Visit: Payer: Medicare Other

## 2017-07-16 ENCOUNTER — Ambulatory Visit: Payer: Medicare Other | Admitting: Family

## 2017-07-17 ENCOUNTER — Encounter: Payer: Self-pay | Admitting: Family

## 2017-07-17 ENCOUNTER — Other Ambulatory Visit: Payer: Self-pay

## 2017-07-17 ENCOUNTER — Inpatient Hospital Stay: Payer: Medicare Other | Attending: Hematology & Oncology

## 2017-07-17 ENCOUNTER — Inpatient Hospital Stay: Payer: Medicare Other

## 2017-07-17 ENCOUNTER — Inpatient Hospital Stay (HOSPITAL_BASED_OUTPATIENT_CLINIC_OR_DEPARTMENT_OTHER): Payer: Medicare Other | Admitting: Family

## 2017-07-17 VITALS — BP 153/50 | HR 75 | Temp 97.6°F | Resp 20 | Wt 152.0 lb

## 2017-07-17 DIAGNOSIS — D631 Anemia in chronic kidney disease: Secondary | ICD-10-CM

## 2017-07-17 DIAGNOSIS — N189 Chronic kidney disease, unspecified: Secondary | ICD-10-CM | POA: Diagnosis not present

## 2017-07-17 DIAGNOSIS — D509 Iron deficiency anemia, unspecified: Secondary | ICD-10-CM

## 2017-07-17 DIAGNOSIS — D500 Iron deficiency anemia secondary to blood loss (chronic): Secondary | ICD-10-CM

## 2017-07-17 LAB — CBC WITH DIFFERENTIAL (CANCER CENTER ONLY)
Basophils Absolute: 0 10*3/uL (ref 0.0–0.1)
Basophils Relative: 0 %
EOS ABS: 0.1 10*3/uL (ref 0.0–0.5)
EOS PCT: 2 %
HCT: 39 % (ref 38.7–49.9)
Hemoglobin: 12.9 g/dL — ABNORMAL LOW (ref 13.0–17.1)
LYMPHS ABS: 1.7 10*3/uL (ref 0.9–3.3)
LYMPHS PCT: 33 %
MCH: 29.9 pg (ref 28.0–33.4)
MCHC: 33.1 g/dL (ref 32.0–35.9)
MCV: 90.5 fL (ref 82.0–98.0)
MONO ABS: 0.6 10*3/uL (ref 0.1–0.9)
MONOS PCT: 12 %
Neutro Abs: 2.7 10*3/uL (ref 1.5–6.5)
Neutrophils Relative %: 53 %
PLATELETS: 189 10*3/uL (ref 145–400)
RBC: 4.31 MIL/uL (ref 4.20–5.70)
RDW: 15.9 % — AB (ref 11.1–15.7)
WBC: 5.1 10*3/uL (ref 4.0–10.0)

## 2017-07-17 LAB — CMP (CANCER CENTER ONLY)
ALBUMIN: 3.6 g/dL (ref 3.5–5.0)
ALK PHOS: 81 U/L (ref 26–84)
ALT: 20 U/L (ref 10–47)
AST: 26 U/L (ref 11–38)
Anion gap: 12 (ref 5–15)
BUN: 33 mg/dL — AB (ref 7–22)
CHLORIDE: 108 mmol/L (ref 98–108)
CO2: 26 mmol/L (ref 18–33)
CREATININE: 1.8 mg/dL — AB (ref 0.60–1.20)
Calcium: 9.3 mg/dL (ref 8.0–10.3)
Glucose, Bld: 171 mg/dL — ABNORMAL HIGH (ref 73–118)
POTASSIUM: 4.1 mmol/L (ref 3.3–4.7)
SODIUM: 146 mmol/L — AB (ref 128–145)
Total Bilirubin: 0.7 mg/dL (ref 0.2–1.6)
Total Protein: 7.2 g/dL (ref 6.4–8.1)

## 2017-07-17 LAB — RETICULOCYTES
RBC.: 4.22 MIL/uL (ref 4.20–5.82)
RETIC COUNT ABSOLUTE: 46.4 10*3/uL (ref 34.8–93.9)
RETIC CT PCT: 1.1 % (ref 0.8–1.8)

## 2017-07-17 MED ORDER — DARBEPOETIN ALFA 300 MCG/0.6ML IJ SOSY
PREFILLED_SYRINGE | INTRAMUSCULAR | Status: AC
  Filled 2017-07-17: qty 0.6

## 2017-07-17 NOTE — Progress Notes (Signed)
Hematology and Oncology Follow Up Visit  [name] [id number] 1928/12/20 84 y.o. 07/17/2017   Principle Diagnosis:  Erythropoietin deficiency anemia Iron deficiency anemia  Current Therapy:   IV iron as indicated Aranesp 300 g subcutaneous as needed for hemoglobin less than 11 - last dose given on 03/2017   Interim History:  Mr. Rengel is here today for follow-up. He is doing well but was hospitalized for bradycardia with Wenckebach several weeks ago. He states that he wore a heart monitor for 2 weeks and will meet with his cardiologist again on March 28th to determine whether or not he needs a pacemaker.   He has occasional palpitations.  Hgb is 12.9. He received Aranesp during his admission.  No fever, chills, n/v, cough, rash, dizziness, SOB, chest pain, abdominal pain or changes in bowel or bladder habits.  No swelling, tenderness, numbness or tingling in her extremities. No c/o pain.  He has maintained a good appetite and is staying well hydrated. His weight is stable.   ECOG Performance Status: 1 - Symptomatic but completely ambulatory  Medications:  Allergies as of 07/17/2017      Reactions   Penicillins Hives   Has patient had a PCN reaction causing immediate rash, facial/tongue/throat swelling, SOB or lightheadedness with hypotension: No Has patient had a PCN reaction causing severe rash involving mucus membranes or skin necrosis: Yes Has patient had a PCN reaction that required hospitalization: No Has patient had a PCN reaction occurring within the last 10 years: No If all of the above answers are "NO", then may proceed with Cephalosporin use.      Medication List        Accurate as of 07/17/17  2:25 PM. Always use your most recent med list.          ACCU-CHEK FASTCLIX LANCETS Misc Check fasting blood sugar once daily   allopurinol 300 MG tablet Commonly known as:  ZYLOPRIM Take 0.5 tablets (150 mg total) by mouth daily.   Aloe Vera 25 MG Caps Take  50 mg by mouth daily.   amLODipine 5 MG tablet Commonly known as:  NORVASC Take 1 tablet (5 mg total) by mouth daily.   aspirin 81 MG tablet Take 81 mg by mouth at bedtime.   atorvastatin 20 MG tablet Commonly known as:  LIPITOR Take 1 tablet (20 mg total) by mouth at bedtime.   cholecalciferol 1000 units tablet Commonly known as:  VITAMIN D Take 1,000 Units by mouth daily.   CHROMIUM GTF PO Take 2 capsules by mouth daily.   Cinnamon 500 MG Tabs Take 2 tablets by mouth at bedtime.   Co Q 10 100 MG Caps Take 100 mg by mouth daily.   dicyclomine 10 MG capsule Commonly known as:  BENTYL Take 1 capsule (10 mg total) by mouth 2 (two) times daily.   ELIQUIS 2.5 MG Tabs tablet Generic drug:  apixaban TAKE 1 TABLET TWICE A DAY   fish oil-omega-3 fatty acids 1000 MG capsule Take 1 g by mouth daily.   furosemide 20 MG tablet Commonly known as:  LASIX Take 1 tablet (20 mg total) by mouth as directed.   glucose blood test strip Commonly known as:  ACCU-CHEK GUIDE Check fasting blood sugar once daily   PRESERVISION AREDS PO Take 1 tablet by mouth 2 (two) times daily.   PROBIOTIC PO Take 1 tablet by mouth daily.   tamsulosin 0.4 MG Caps capsule Commonly known as:  FLOMAX TAKE 1 CAPSULE BY MOUTH ONCE  DAILY AFTER SUPPER   VANADYL SULFATE PO Take 1 capsule by mouth daily.   zinc gluconate 50 MG tablet Take 50 mg by mouth daily.       Allergies:  Allergies  Allergen Reactions  . Penicillins Hives    Has patient had a PCN reaction causing immediate rash, facial/tongue/throat swelling, SOB or lightheadedness with hypotension: No Has patient had a PCN reaction causing severe rash involving mucus membranes or skin necrosis: Yes Has patient had a PCN reaction that required hospitalization: No Has patient had a PCN reaction occurring within the last 10 years: No If all of the above answers are "NO", then may proceed with Cephalosporin use.     Past Medical History,  Surgical history, Social history, and Family History were reviewed and updated.  Review of Systems: All other 10 point review of systems is negative.   Physical Exam:  weight is 152 lb (68.9 kg). His oral temperature is 97.6 F (36.4 C). His blood pressure is 153/50 (abnormal) and his pulse is 75. His respiration is 20 and oxygen saturation is 100%.   Wt Readings from Last 3 Encounters:  07/17/17 152 lb (68.9 kg)  06/22/17 147 lb 4.8 oz (66.8 kg)  05/21/17 151 lb 12.8 oz (68.9 kg)    Ocular: Sclerae unicteric, pupils equal, round and reactive to light Ear-nose-throat: Oropharynx clear, dentition fair Lymphatic: No cervical, supraclavicular or axillary adenopathy Lungs no rales or rhonchi, good excursion bilaterally Heart regular rate and rhythm, no murmur appreciated Abd soft, nontender, positive bowel sounds, no liver or spleen tip palpated on exam, no fluid wave  MSK no focal spinal tenderness, no joint edema Neuro: non-focal, well-oriented, appropriate affect Breasts: Deferred   Lab Results  Component Value Date   WBC 5.1 07/17/2017   HGB 9.7 (L) 06/21/2017   HCT 39.0 07/17/2017   MCV 90.5 07/17/2017   PLT 189 07/17/2017   Lab Results  Component Value Date   FERRITIN 371 (H) 05/21/2017   IRON 119 05/21/2017   TIBC 244 05/21/2017   UIBC 125 05/21/2017   IRONPCTSAT 49 05/21/2017   Lab Results  Component Value Date   RETICCTPCT 0.9 05/21/2017   RBC 4.31 07/17/2017   No results found for: KPAFRELGTCHN, LAMBDASER, KAPLAMBRATIO No results found for: Kandis Cocking, IGMSERUM Lab Results  Component Value Date   TOTALPROTELP 6.0 (L) 12/26/2015   ALBUMINELP 3.4 (L) 12/26/2015   A1GS 0.4 (H) 12/26/2015   A2GS 0.8 12/26/2015   BETS 0.4 12/26/2015   BETA2SER 0.3 12/26/2015   GAMS 0.8 12/26/2015   MSPIKE Not Observed 01/29/2016   SPEI SEE NOTE 12/26/2015     Chemistry      Component Value Date/Time   NA 146 (H) 07/17/2017 1359   NA 138 05/27/2017 1021   NA 145  04/09/2017 0905   NA 140 03/28/2016 0853   K 4.1 07/17/2017 1359   K 4.5 04/09/2017 0905   K 4.4 03/28/2016 0853   CL 108 07/17/2017 1359   CL 105 04/09/2017 0905   CO2 26 07/17/2017 1359   CO2 27 04/09/2017 0905   CO2 19 (L) 03/28/2016 0853   BUN 33 (H) 07/17/2017 1359   BUN 40 (H) 05/27/2017 1021   BUN 33 (H) 04/09/2017 0905   BUN 34.4 (H) 03/28/2016 0853   CREATININE 1.80 (H) 07/17/2017 1359   CREATININE 1.9 (H) 04/09/2017 0905   CREATININE 1.7 (H) 03/28/2016 0853   GLU 113 07/24/2016      Component Value Date/Time  CALCIUM 9.3 07/17/2017 1359   CALCIUM 9.2 04/09/2017 0905   CALCIUM 9.2 03/28/2016 0853   ALKPHOS 81 07/17/2017 1359   ALKPHOS 69 04/09/2017 0905   ALKPHOS 85 03/28/2016 0853   AST 26 07/17/2017 1359   AST 23 03/28/2016 0853   ALT 20 07/17/2017 1359   ALT 27 04/09/2017 0905   ALT 37 03/28/2016 0853   BILITOT 0.7 07/17/2017 1359   BILITOT 0.79 03/28/2016 0853      Impression and Plan: Ms. Dimmick is a very pleasant 84 yo caucasian gentleman with both erythropoietin deficiency and iron deficiency anemia. He received Aranesp last month during his hospital admission and Hgb today is 12.9. No Aranesp needed this visit.  We will see what his iron studies show and bring him back in for infusion if needed.  We will plan to see him back in another 2 months for follow-up.  He will contact our office with any questions or concerns. We can certainly see him sooner if need be.   Laverna Peace, NP 3/22/20192:25 PM

## 2017-07-20 ENCOUNTER — Telehealth: Payer: Self-pay | Admitting: *Deleted

## 2017-07-20 LAB — FERRITIN: Ferritin: 186 ng/mL (ref 22–316)

## 2017-07-20 LAB — IRON AND TIBC
Iron: 80 ug/dL (ref 42–163)
SATURATION RATIOS: 34 % — AB (ref 42–163)
TIBC: 238 ug/dL (ref 202–409)
UIBC: 158 ug/dL

## 2017-07-20 NOTE — Telephone Encounter (Addendum)
Message left on personal voice mail  ----- Message from [name], MD sent at 07/20/2017  2:01 PM EDT ----- Call - iron level is ok!!!  Laurey Arrow

## 2017-07-21 ENCOUNTER — Telehealth: Payer: Self-pay

## 2017-07-21 NOTE — Telephone Encounter (Signed)
William Hector, MD  Michaelyn Barter, RN        Still with bradycardia and high grade heart block off coreg Needs to see EP ASAP for consideration of pacer    Patient aware of results. Patient has EP appt with Dr. Rayann Heman in two days. Patient verbalized understanding.

## 2017-07-21 NOTE — Telephone Encounter (Signed)
Left message for patient to call back  

## 2017-07-22 NOTE — Progress Notes (Deleted)
Cardiology Office Note    Date:  07/22/2017   ID:  [name], DOB 09-25-1928, MRN [id number]  PCP:  [name], DO  Cardiologist: Andrez Grime chief complaint on file.   History of Present Illness:   84 y.o. followed for CAD post CABG, CHF mostly diastolic, PAF and SSS. CABG 1999 with LIMA LAD SVG IM-OM1-OM-2, SVG D1 and SVG PDA/PLA. EF by echo 04/03/17 normal 55-60%. Patent grafts cath 2011 and non ischemic myovue May 2017. Has had [facility] in July 2016 and again in December 2018.    with history of CADwith prior CABG in 1999 -LIMA to the LAD, SVG to IM, OM1, OM 2, SVG to D1, SVG to PDA/PLA, EF 45-50% with mild diffuse hypokinesis. Grafts patent by cath 2011   Nuclear stress test 08/2015 scar, no ischemia LVEF 70% also has atrial fibrillation/flutter in 2014 on Eliquis status post [facility] 10/2014, PVD, claudication, von Willebrand followed by hematology. D/c from hospital 06/22/17 with  Pre syncope and dizziness with bradycardia and wenckebach Coreg d/c and had outpatient monitor ordered. Reviewed/Read this and he had 3.5 second pauses, HR in 30's and high grade AV block. Referred to EP for consideration of PPM  ***   Past Medical History:  Diagnosis Date  . A-fib ([place])   . Basal cell carcinoma    skin  . Bigeminal rhythm   . Cataract   . Chronic renal insufficiency, stage 3 (moderate) (HCC) 2018   GFR 30s-40s  . Coronary artery disease    post bypass  . CVD (cardiovascular disease)   . Diabetes mellitus without complication ([place])   . Diverticular disease   . Easy bruising   . 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 Gout   . Hernia   . Hyperlipidemia   . Hypertension   . Iron deficiency anemia 02/04/2016  . Macular degeneration   . Microscopic colitis   . PVD (peripheral vascular disease) ([place])     Past Surgical History:  Procedure Laterality Date  . CARDIAC CATHETERIZATION  2006  . CARDIOVERSION N/A 02/22/2013   Procedure: CARDIOVERSION;  Surgeon: [name], MD;  Location: [facility] ENDOSCOPY;  Service: Cardiovascular;  Laterality: N/A;  . CARDIOVERSION N/A 11/15/2014   Procedure: CARDIOVERSION;  Surgeon: [name], MD;  Location: [facility] ENDOSCOPY;  Service: Cardiovascular;  Laterality: N/A;  . CARDIOVERSION N/A 04/01/2017   Procedure: CARDIOVERSION;  Surgeon: Larey Dresser, MD;  Location: [facility];  Service: Cardiovascular;  Laterality: N/A;  . CAROTID ENDARTERECTOMY  2009/ 1993   left/ right  . CORONARY ARTERY BYPASS GRAFT  1999  . EYE SURGERY     eyelid repair  . INGUINAL HERNIA REPAIR  02/11/2012   Procedure: LAPAROSCOPIC BILATERAL INGUINAL HERNIA REPAIR;  Surgeon: Pedro Earls, MD;  Location: [facility] ORS;  Service: General;  Laterality: Bilateral;  . SKIN CANCER EXCISION     right ear x 3    Current Medications: No outpatient medications have been marked as taking for the 07/28/17 encounter (Appointment) with [name], MD.     Allergies:   Penicillins   Social History   Socioeconomic History  . Marital status: Married    Spouse name: Not on file  . Number of children: Not on file  . Years of education: Not on file  . Highest education level: Not on file  Occupational History  . Occupation: retired  Scientific laboratory technician  . Financial resource strain: Not on file  . Food insecurity:  Worry: Not on file    Inability: Not on file  . Transportation needs:    Medical: Not on file    Non-medical: Not on file  Tobacco Use  . Smoking status: Former Smoker    Last attempt to quit: 12/25/1961    Years since quitting: 55.6  . Smokeless tobacco: Former Systems developer    Types: [place] date: 02/03/1969  Substance and Sexual Activity  . Alcohol use: No    Alcohol/week: 0.0 oz  . Drug use: No  . Sexual activity: Never  Lifestyle  . Physical activity:    Days per week: Not on file    Minutes per session: Not on file  . Stress: Not on file  Relationships  . Social connections:    Talks on phone: Not on file    Gets together:  Not on file    Attends religious service: Not on file    Active member of club or organization: Not on file    Attends meetings of clubs or organizations: Not on file    Relationship status: Not on file  Other Topics Concern  . Not on file  Social History Narrative   Married to Iota, 2 children [name] and [name].   Some college. Retired from [employer].   Drinks caffeine, uses herbal remedies, takes a daily vitamin.   Wears his seatbelt, smoke detector at home, firearms in the home.   Wears a hearing aid.   Feels safe in her relationships.     Family History:  The patient's family history includes Coronary artery disease in his father; Heart disease in his father; Melanoma in his sister; Stroke in his mother.   ROS:   Please see the history of present illness.    Review of Systems  Constitution: Negative.  HENT: Negative.   Cardiovascular: Positive for leg swelling.  Respiratory: Negative.   Endocrine: Negative.   Hematologic/Lymphatic: Negative.   Musculoskeletal: Negative.   Gastrointestinal: Negative.   Genitourinary: Negative.   Neurological: Negative.    All other systems reviewed and are negative.   PHYSICAL EXAM:   VS:  There were no vitals taken for this visit.  Physical Exam  Affect appropriate Healthy:  appears stated age 89: normal Neck supple with no adenopathy JVP normal no bruits no thyromegaly Lungs clear with no wheezing and good diaphragmatic motion Heart:  S1/S2 no murmur, no rub, gallop or click PMI normal Abdomen: benighn, BS positve, no tenderness, no AAA no bruit.  No HSM or HJR Distal pulses intact with no bruits No edema Neuro non-focal Skin warm and dry No muscular weakness    Wt Readings from Last 3 Encounters:  07/17/17 152 lb (68.9 kg)  06/22/17 147 lb 4.8 oz (66.8 kg)  05/21/17 151 lb 12.8 oz (68.9 kg)      Studies/Labs Reviewed:   EKG:  EKG is  ordered today.  The ekg ordered today demonstrates normal sinus  rhythm at 58 bpm  Recent Labs: 12/04/2016: Magnesium 1.5; TSH 3.54 03/24/2017: NT-Pro BNP 4,003 06/21/2017: B Natriuretic Peptide 468.6; Hemoglobin 9.7 07/17/2017: ALT 20; BUN 33; Creatinine 1.80; Platelet Count 189; Potassium 4.1; Sodium 146   Lab Results  Component Value Date   LDLCALC 55 06/21/2017       Plan:  1. Diastolic Dysfunction: despite CRF needs some diuretic continue lasix 20 mg daily 2. PAF: on low dose eliquis due to CRF and age no bleeding issues Maintaining NSR  3. HTN:  Well controlled.  Continue current medications and low sodium Dash type diet.   4. CAD/CABG:  No chest pain low risk myovue 2017 continue medical Rx 5. Cholesterol:  On statin LDL 49 with normal LFTls September 2018  6. Bradycardia: persists with high grade AV block off coreg Per EP evaluation for PPM   F/U with me in 3 months   Jenkins Rouge

## 2017-07-23 ENCOUNTER — Ambulatory Visit (INDEPENDENT_AMBULATORY_CARE_PROVIDER_SITE_OTHER): Payer: Medicare Other | Admitting: Internal Medicine

## 2017-07-23 ENCOUNTER — Encounter: Payer: Self-pay | Admitting: Internal Medicine

## 2017-07-23 VITALS — BP 132/60 | HR 58 | Ht 68.0 in | Wt 151.0 lb

## 2017-07-23 DIAGNOSIS — I471 Supraventricular tachycardia: Secondary | ICD-10-CM | POA: Diagnosis not present

## 2017-07-23 DIAGNOSIS — I2583 Coronary atherosclerosis due to lipid rich plaque: Secondary | ICD-10-CM

## 2017-07-23 DIAGNOSIS — I2510 Atherosclerotic heart disease of native coronary artery without angina pectoris: Secondary | ICD-10-CM

## 2017-07-23 DIAGNOSIS — I119 Hypertensive heart disease without heart failure: Secondary | ICD-10-CM

## 2017-07-23 DIAGNOSIS — R001 Bradycardia, unspecified: Secondary | ICD-10-CM | POA: Diagnosis not present

## 2017-07-23 DIAGNOSIS — I441 Atrioventricular block, second degree: Secondary | ICD-10-CM | POA: Diagnosis not present

## 2017-07-23 NOTE — Patient Instructions (Addendum)
Medication Instructions:  Your physician has recommended you make the following change in your medication:  1.  Stop taking aspirin  Labwork: You will get lab work today:  BMP and CBC.  Testing/Procedures: Your physician has recommended that you have a pacemaker inserted. A pacemaker is a small device that is placed under the skin of your chest or abdomen to help control abnormal heart rhythms. This device uses electrical pulses to prompt the heart to beat at a normal rate. Pacemakers are used to treat heart rhythms that are too slow. Wire (leads) are attached to the pacemaker that goes into the chambers of you heart. This is done in the hospital and usually requires and overnight stay. Please see the instruction sheet given to you today for more information.  Follow-Up: You will follow up with device clinic 10-14 days after your procedure for a wound check.  You will follow up with Dr. Rayann Heman 91 days after your procedure.  Any Other Special Instructions Will Be Listed Below (If Applicable).  Please arrive at the [facility] main entrance of [facility] hospital at:  12:00 pm on July 28, 2017 Use the CHG scrub as directed You may have a small light breakfast before 7:00 am.  Nothing to eat after. Do not take any medications the morning of the procedure.  You may have small sips of water as needed for dry mouth Hold your Eliquis for 1 day prior to your procedure.  Your last dose will be July 26, 2017 your EVENING dose. Plan for one night stay You will need someone to drive you home at discharge  If you need a refill on your cardiac medications before your next appointment, please call your pharmacy.   Pacemaker Implantation, Adult Pacemaker implantation is a procedure to place a pacemaker inside your chest. A pacemaker is a small computer that sends electrical signals to the heart and helps your heart beat normally. A pacemaker also stores information about your heart rhythms. You may  need pacemaker implantation if you:  Have a slow heartbeat (bradycardia).  Faint (syncope).  Have shortness of breath (dyspnea) due to heart problems.  The pacemaker attaches to your heart through a wire, called a lead. Sometimes just one lead is needed. Other times, there will be two leads. There are two types of pacemakers:  Transvenous pacemaker. This type is placed under the skin or muscle of your chest. The lead goes through a vein in the chest area to reach the inside of the heart.  Epicardial pacemaker. This type is placed under the skin or muscle of your chest or belly. The lead goes through your chest to the outside of the heart.  Tell a health care provider about:  Any allergies you have.  All medicines you are taking, including vitamins, herbs, eye drops, creams, and over-the-counter medicines.  Any problems you or family members have had with anesthetic medicines.  Any blood or bone disorders you have.  Any surgeries you have had.  Any medical conditions you have.  Whether you are pregnant or may be pregnant. What are the risks? Generally, this is a safe procedure. However, problems may occur, including:  Infection.  Bleeding.  Failure of the pacemaker or the lead.  Collapse of a lung or bleeding into a lung.  Blood clot inside a blood vessel with a lead.  Damage to the heart.  Infection inside the heart (endocarditis).  Allergic reactions to medicines.  What happens before the procedure? Staying hydrated Follow  instructions from your health care provider about hydration, which may include:  Up to 2 hours before the procedure - you may continue to drink clear liquids, such as water, clear fruit juice, black coffee, and plain tea.  Eating and drinking restrictions Follow instructions from your health care provider about eating and drinking, which may include:  8 hours before the procedure - stop eating heavy meals or foods such as meat, fried foods,  or fatty foods.  6 hours before the procedure - stop eating light meals or foods, such as toast or cereal.  6 hours before the procedure - stop drinking milk or drinks that contain milk.  2 hours before the procedure - stop drinking clear liquids.  Medicines  Ask your health care provider about: ? Changing or stopping your regular medicines. This is especially important if you are taking diabetes medicines or blood thinners. ? Taking medicines such as aspirin and ibuprofen. These medicines can thin your blood. Do not take these medicines before your procedure if your health care provider instructs you not to.  You may be given antibiotic medicine to help prevent infection. General instructions  You will have a heart evaluation. This may include an electrocardiogram (ECG), chest X-ray, and heart imaging (echocardiogram,  or echo) tests.  You will have blood tests.  Do not use any products that contain nicotine or tobacco, such as cigarettes and e-cigarettes. If you need help quitting, ask your health care provider.  Plan to have someone take you home from the hospital or clinic.  If you will be going home right after the procedure, plan to have someone with you for 24 hours.  Ask your health care provider how your surgical site will be marked or identified. What happens during the procedure?  To reduce your risk of infection: ? Your health care team will wash or sanitize their hands. ? Your skin will be washed with soap. ? Hair may be removed from the surgical area.  An IV tube will be inserted into one of your veins.  You will be given one or more of the following: ? A medicine to help you relax (sedative). ? A medicine to numb the area (local anesthetic). ? A medicine to make you fall asleep (general anesthetic).  If you are getting a transvenous pacemaker: ? An incision will be made in your upper chest. ? A pocket will be made for the pacemaker. It may be placed under  the skin or between layers of muscle. ? The lead will be inserted into a blood vessel that returns to the heart. ? While X-rays are taken by an imaging machine (fluoroscopy), the lead will be advanced through the vein to the inside of your heart. ? The other end of the lead will be tunneled under the skin and attached to the pacemaker.  If you are getting an epicardial pacemaker: ? An incision will be made near your ribs or breastbone (sternum) for the lead. ? The lead will be attached to the outside of your heart. ? Another incision will be made in your chest or upper belly to create a pocket for the pacemaker. ? The free end of the lead will be tunneled under the skin and attached to the pacemaker.  The transvenous or epicardial pacemaker will be tested. Imaging studies may be done to check the lead position.  The incisions will be closed with stitches (sutures), adhesive strips, or skin glue.  Bandages (dressing) will be placed over the incisions.  The procedure may vary among health care providers and hospitals. What happens after the procedure?  Your blood pressure, heart rate, breathing rate, and blood oxygen level will be monitored until the medicines you were given have worn off.  You will be given antibiotics and pain medicine.  ECG and chest x-rays will be done.  You will wear a continuous type of ECG (Holter monitor) to check your heart rhythm.  Your health care provider willprogram the pacemaker.  Do not drive for 24 hours if you received a sedative. This information is not intended to replace advice given to you by your health care provider. Make sure you discuss any questions you have with your health care provider. Document Released: 04/04/2002 Document Revised: 11/02/2015 Document Reviewed: 09/26/2015 Elsevier Interactive Patient Education  [employer].

## 2017-07-23 NOTE — H&P (View-Only) (Signed)
Electrophysiology Office Note   Date:  07/23/2017   ID:  [name], DOB [birth year], MRN [id number]  PCP:  [name], DO  Cardiologist:  Dr Johnsie Cancel Primary Electrophysiologist: Thompson Grayer, MD    CC: AV block   History of Present Illness: [name] is a 84 y.o. male who presents today for electrophysiology evaluation.   The patient is referred by Dr Johnsie Cancel for EP consultation regarding symptomatic bradycardia.  He recently was hospitalized (records reviewed) for symptomatic second degree type I AV block.  His coreg was discontinued.  He was discharged with an event monitor.  He continues to have symptomatic bradycardia.  He reports occasional heart rates 40s.  He has fatigue and headaches with this.  His event monitor has also shown more advanced AV block with multiple consecutive p waves not conducted and prolonged RR intervals.  He denies presyncope or syncope.  + occasional dizziness  Today, he denies symptoms of palpitations, chest pain, shortness of breath, orthopnea, PND, lower extremity edema, claudication,  bleeding, or neurologic sequela. The patient is tolerating medications without difficulties and is otherwise without complaint today.    Past Medical History:  Diagnosis Date  . A-fib ([place])   . Basal cell carcinoma    skin  . Bigeminal rhythm   . Cataract   . Chronic renal insufficiency, stage 3 (moderate) (HCC) 2018   GFR 30s-40s  . Coronary artery disease    post bypass  . CVD (cardiovascular disease)   . Diabetes mellitus without complication ([place])   . Diverticular disease   . Easy bruising   . 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 Gout   . Hernia   . Hyperlipidemia   . Hypertension   . Iron deficiency anemia 02/04/2016  . Macular degeneration   . Microscopic colitis   . PVD (peripheral vascular disease) ([place])    Past Surgical History:  Procedure Laterality Date  . CARDIAC CATHETERIZATION  2006  . CARDIOVERSION N/A 02/22/2013   Procedure: CARDIOVERSION;  Surgeon: [name], MD;  Location: [facility] ENDOSCOPY;  Service: Cardiovascular;  Laterality: N/A;  . CARDIOVERSION N/A 11/15/2014   Procedure: CARDIOVERSION;  Surgeon: [name], MD;  Location: [facility] ENDOSCOPY;  Service: Cardiovascular;  Laterality: N/A;  . CARDIOVERSION N/A 04/01/2017   Procedure: CARDIOVERSION;  Surgeon: Larey Dresser, MD;  Location: [place];  Service: Cardiovascular;  Laterality: N/A;  . CAROTID ENDARTERECTOMY  2009/ 1993   left/ right  . CORONARY ARTERY BYPASS GRAFT  1999  . EYE SURGERY     eyelid repair  . INGUINAL HERNIA REPAIR  02/11/2012   Procedure: LAPAROSCOPIC BILATERAL INGUINAL HERNIA REPAIR;  Surgeon: Pedro Earls, MD;  Location: [facility] ORS;  Service: General;  Laterality: Bilateral;  . SKIN CANCER EXCISION     right ear x 3     Current Outpatient Medications  Medication Sig Dispense Refill  . ACCU-CHEK FASTCLIX LANCETS MISC Check fasting blood sugar once daily 100 each 3  . allopurinol (ZYLOPRIM) 300 MG tablet Take 0.5 tablets (150 mg total) by mouth daily. 45 tablet 3  . Aloe Vera 25 MG CAPS Take 50 mg by mouth daily.     Marland Kitchen amLODipine (NORVASC) 5 MG tablet Take 1 tablet (5 mg total) by mouth daily. 90 tablet 3  . aspirin 81 MG tablet Take 81 mg by mouth at bedtime.     Marland Kitchen atorvastatin (LIPITOR) 20 MG tablet Take 1 tablet (20 mg total) by mouth at bedtime. [place]  tablet 3  . cholecalciferol (VITAMIN D) 1000 units tablet Take 1,000 Units by mouth daily.     . CHROMIUM GTF PO Take 2 capsules by mouth daily.    . Cinnamon 500 MG TABS Take 2 tablets by mouth at bedtime.     . Coenzyme Q10 (CO Q 10) 100 MG CAPS Take 100 mg by mouth daily.     Marland Kitchen dicyclomine (BENTYL) 10 MG capsule Take 1 capsule (10 mg total) by mouth 2 (two) times daily. 180 capsule 3  . ELIQUIS 2.5 MG TABS tablet TAKE 1 TABLET TWICE A DAY 180 tablet 1  . fish oil-omega-3 fatty acids 1000 MG capsule Take 1 g by mouth daily.     . furosemide (LASIX) 20 MG tablet  Take 1 tablet (20 mg total) by mouth as directed. 90 tablet 3  . glucose blood (ACCU-CHEK GUIDE) test strip Check fasting blood sugar once daily 100 each 3  . Multiple Vitamins-Minerals (PRESERVISION AREDS PO) Take 1 tablet by mouth 2 (two) times daily.     . Probiotic Product (PROBIOTIC PO) Take 1 tablet by mouth daily.    . tamsulosin (FLOMAX) 0.4 MG CAPS capsule TAKE 1 CAPSULE BY MOUTH ONCE DAILY AFTER SUPPER 90 capsule 3  . VANADYL SULFATE PO Take 1 capsule by mouth daily.    Marland Kitchen zinc gluconate 50 MG tablet Take 50 mg by mouth daily.      No current facility-administered medications for this visit.     Allergies:   Penicillins   Social History:  The patient  reports that he quit smoking about 55 years ago. He quit smokeless tobacco use about 48 years ago. His smokeless tobacco use included chew. He reports that he does not drink alcohol or use drugs.   Family History:  The patient's  family history includes Coronary artery disease in his father; Heart disease in his father; Melanoma in his sister; Stroke in his mother.    ROS:  Please see the history of present illness.   All other systems are personally reviewed and negative.    PHYSICAL EXAM: VS:  BP 132/60   Pulse (!) 58   Ht 5\' 8"  (1.727 m)   Wt 68.5 kg (151 lb)   BMI 22.96 kg/m  , BMI Body mass index is 22.96 kg/m. GEN: elderly in no acute distress  HEENT: normal  Neck: no JVD, R bruit Cardiac: RRR; no murmurs, rubs, or gallops,no edema  Respiratory:  clear to auscultation bilaterally, normal work of breathing GI: soft, nontender, nondistended, + BS MS: no deformity or atrophy  Skin: warm and dry  Neuro:  Strength and sensation are intact Psych: euthymic mood, full affect  EKG:  EKG is ordered today. The ekg ordered today is personally reviewed and shows ectopic atrial rhythm with 2:1 AV conduction, V rate 58 bpm,    Recent Labs: 12/04/2016: Magnesium 1.5; TSH 3.54 03/24/2017: NT-Pro BNP 4,003 06/21/2017: B  Natriuretic Peptide 468.6; Hemoglobin 9.7 07/17/2017: ALT 20; BUN 33; Creatinine 1.80; Platelet Count 189; Potassium 4.1; Sodium 146  personally reviewed   Lipid Panel     Component Value Date/Time   CHOL 110 06/21/2017 0420   TRIG 44 06/21/2017 0420   HDL 46 06/21/2017 0420   CHOLHDL 2.4 06/21/2017 0420   VLDL 9 06/21/2017 0420   LDLCALC 55 06/21/2017 0420   LDLDIRECT 49 01/15/2017 1031   personally reviewed   Wt Readings from Last 3 Encounters:  07/23/17 68.5 kg (151 lb)  07/17/17 68.9  kg (152 lb)  06/22/17 66.8 kg (147 lb 4.8 oz)      Other studies personally reviewed: Additional studies/ records that were reviewed today include: Dr [name] Arn, notes, recent hospital records, Dr Antonieta Pert notes, prior echo (normal EF), and recent event monitor tracings are all personally reviewed in detail  Review of the above records today demonstrates: as above   ASSESSMENT AND PLAN:  1.  Second degree AV block The patient has symptomatic AV block with advanced conduction system disease documented.  No reversible causes are found. The patient has symptomatic bradycardia.  I would therefore recommend pacemaker implantation at this time.  Risks, benefits, alternatives to pacemaker implantation were discussed in detail with the patient today. The patient understands that the risks include but are not limited to bleeding, infection, pneumothorax, perforation, tamponade, vascular damage, renal failure, MI, stroke, death,  and lead dislodgement and wishes to proceed. We will therefore schedule the procedure at the next available time.  I will stop ASA and hold eliquis for 24 hours prior to the procedure.  He will need to call 911 and report urgently to [facility] for any symptomatic arrhythmias in the interim.  2. Hypertensive cardiovascular disease Recently has had issues with elevated BP Better today Will reassess post PPM  3. Afib/ atypical atrial flutter/ atrial tachycardia In atach today with  2:1 conduction Our ability to rate control is reduced due to AV block No changes today Hold eliquis 24 hours prior to PPM chads2vasc score is 5 Stop ASA  4. CAD No ischemic symptoms No changes   Current medicines are reviewed at length with the patient today.   The patient does not have concerns regarding his medicines.  The following changes were made today:  none    Signed, Thompson Grayer, MD  07/23/2017 10:58 AM     [facility] [address] [place] [address] [phone] (office) [phone] (fax)

## 2017-07-23 NOTE — Progress Notes (Signed)
Electrophysiology Office Note   Date:  07/23/2017   ID:  KELVON GIANNINI, DOB February 03, 1929, MRN [id number]  PCP:  [name], DO  Cardiologist:  Dr Johnsie Cancel Primary Electrophysiologist: Thompson Grayer, MD    CC: AV block   History of Present Illness: [name] is a 84 y.o. male who presents today for electrophysiology evaluation.   The patient is referred by Dr Johnsie Cancel for EP consultation regarding symptomatic bradycardia.  He recently was hospitalized (records reviewed) for symptomatic second degree type I AV block.  His coreg was discontinued.  He was discharged with an event monitor.  He continues to have symptomatic bradycardia.  He reports occasional heart rates 40s.  He has fatigue and headaches with this.  His event monitor has also shown more advanced AV block with multiple consecutive p waves not conducted and prolonged RR intervals.  He denies presyncope or syncope.  + occasional dizziness  Today, he denies symptoms of palpitations, chest pain, shortness of breath, orthopnea, PND, lower extremity edema, claudication,  bleeding, or neurologic sequela. The patient is tolerating medications without difficulties and is otherwise without complaint today.    Past Medical History:  Diagnosis Date  . A-fib ([place])   . Basal cell carcinoma    skin  . Bigeminal rhythm   . Cataract   . Chronic renal insufficiency, stage 3 (moderate) (HCC) 2018   GFR 30s-40s  . Coronary artery disease    post bypass  . CVD (cardiovascular disease)   . Diabetes mellitus without complication ([place])   . Diverticular disease   . Easy bruising   . 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 Gout   . Hernia   . Hyperlipidemia   . Hypertension   . Iron deficiency anemia 02/04/2016  . Macular degeneration   . Microscopic colitis   . PVD (peripheral vascular disease) ([place])    Past Surgical History:  Procedure Laterality Date  . CARDIAC CATHETERIZATION  2006  . CARDIOVERSION N/A 02/22/2013   Procedure: CARDIOVERSION;  Surgeon: [name], MD;  Location: [facility] ENDOSCOPY;  Service: Cardiovascular;  Laterality: N/A;  . CARDIOVERSION N/A 11/15/2014   Procedure: CARDIOVERSION;  Surgeon: [name], MD;  Location: [facility] ENDOSCOPY;  Service: Cardiovascular;  Laterality: N/A;  . CARDIOVERSION N/A 04/01/2017   Procedure: CARDIOVERSION;  Surgeon: Larey Dresser, MD;  Location: [place];  Service: Cardiovascular;  Laterality: N/A;  . CAROTID ENDARTERECTOMY  2009/ 1993   left/ right  . CORONARY ARTERY BYPASS GRAFT  1999  . EYE SURGERY     eyelid repair  . INGUINAL HERNIA REPAIR  02/11/2012   Procedure: LAPAROSCOPIC BILATERAL INGUINAL HERNIA REPAIR;  Surgeon: Pedro Earls, MD;  Location: [facility] ORS;  Service: General;  Laterality: Bilateral;  . SKIN CANCER EXCISION     right ear x 3     Current Outpatient Medications  Medication Sig Dispense Refill  . ACCU-CHEK FASTCLIX LANCETS MISC Check fasting blood sugar once daily 100 each 3  . allopurinol (ZYLOPRIM) 300 MG tablet Take 0.5 tablets (150 mg total) by mouth daily. 45 tablet 3  . Aloe Vera 25 MG CAPS Take 50 mg by mouth daily.     Marland Kitchen amLODipine (NORVASC) 5 MG tablet Take 1 tablet (5 mg total) by mouth daily. 90 tablet 3  . aspirin 81 MG tablet Take 81 mg by mouth at bedtime.     Marland Kitchen atorvastatin (LIPITOR) 20 MG tablet Take 1 tablet (20 mg total) by mouth at bedtime. [place]  tablet 3  . cholecalciferol (VITAMIN D) 1000 units tablet Take 1,000 Units by mouth daily.     . CHROMIUM GTF PO Take 2 capsules by mouth daily.    . Cinnamon 500 MG TABS Take 2 tablets by mouth at bedtime.     . Coenzyme Q10 (CO Q 10) 100 MG CAPS Take 100 mg by mouth daily.     Marland Kitchen dicyclomine (BENTYL) 10 MG capsule Take 1 capsule (10 mg total) by mouth 2 (two) times daily. 180 capsule 3  . ELIQUIS 2.5 MG TABS tablet TAKE 1 TABLET TWICE A DAY 180 tablet 1  . fish oil-omega-3 fatty acids 1000 MG capsule Take 1 g by mouth daily.     . furosemide (LASIX) 20 MG tablet  Take 1 tablet (20 mg total) by mouth as directed. 90 tablet 3  . glucose blood (ACCU-CHEK GUIDE) test strip Check fasting blood sugar once daily 100 each 3  . Multiple Vitamins-Minerals (PRESERVISION AREDS PO) Take 1 tablet by mouth 2 (two) times daily.     . Probiotic Product (PROBIOTIC PO) Take 1 tablet by mouth daily.    . tamsulosin (FLOMAX) 0.4 MG CAPS capsule TAKE 1 CAPSULE BY MOUTH ONCE DAILY AFTER SUPPER 90 capsule 3  . VANADYL SULFATE PO Take 1 capsule by mouth daily.    Marland Kitchen zinc gluconate 50 MG tablet Take 50 mg by mouth daily.      No current facility-administered medications for this visit.     Allergies:   Penicillins   Social History:  The patient  reports that he quit smoking about 55 years ago. He quit smokeless tobacco use about 48 years ago. His smokeless tobacco use included chew. He reports that he does not drink alcohol or use drugs.   Family History:  The patient's  family history includes Coronary artery disease in his father; Heart disease in his father; Melanoma in his sister; Stroke in his mother.    ROS:  Please see the history of present illness.   All other systems are personally reviewed and negative.    PHYSICAL EXAM: VS:  BP 132/60   Pulse (!) 58   Ht 5\' 8"  (1.727 m)   Wt 68.5 kg (151 lb)   BMI 22.96 kg/m  , BMI Body mass index is 22.96 kg/m. GEN: elderly in no acute distress  HEENT: normal  Neck: no JVD, R bruit Cardiac: RRR; no murmurs, rubs, or gallops,no edema  Respiratory:  clear to auscultation bilaterally, normal work of breathing GI: soft, nontender, nondistended, + BS MS: no deformity or atrophy  Skin: warm and dry  Neuro:  Strength and sensation are intact Psych: euthymic mood, full affect  EKG:  EKG is ordered today. The ekg ordered today is personally reviewed and shows ectopic atrial rhythm with 2:1 AV conduction, V rate 58 bpm,    Recent Labs: 12/04/2016: Magnesium 1.5; TSH 3.54 03/24/2017: NT-Pro BNP 4,003 06/21/2017: B  Natriuretic Peptide 468.6; Hemoglobin 9.7 07/17/2017: ALT 20; BUN 33; Creatinine 1.80; Platelet Count 189; Potassium 4.1; Sodium 146  personally reviewed   Lipid Panel     Component Value Date/Time   CHOL 110 06/21/2017 0420   TRIG 44 06/21/2017 0420   HDL 46 06/21/2017 0420   CHOLHDL 2.4 06/21/2017 0420   VLDL 9 06/21/2017 0420   LDLCALC 55 06/21/2017 0420   LDLDIRECT 49 01/15/2017 1031   personally reviewed   Wt Readings from Last 3 Encounters:  07/23/17 68.5 kg (151 lb)  07/17/17 68.9  kg (152 lb)  06/22/17 66.8 kg (147 lb 4.8 oz)      Other studies personally reviewed: Additional studies/ records that were reviewed today include: Dr [name] Arn, notes, recent hospital records, Dr Antonieta Pert notes, prior echo (normal EF), and recent event monitor tracings are all personally reviewed in detail  Review of the above records today demonstrates: as above   ASSESSMENT AND PLAN:  1.  Second degree AV block The patient has symptomatic AV block with advanced conduction system disease documented.  No reversible causes are found. The patient has symptomatic bradycardia.  I would therefore recommend pacemaker implantation at this time.  Risks, benefits, alternatives to pacemaker implantation were discussed in detail with the patient today. The patient understands that the risks include but are not limited to bleeding, infection, pneumothorax, perforation, tamponade, vascular damage, renal failure, MI, stroke, death,  and lead dislodgement and wishes to proceed. We will therefore schedule the procedure at the next available time.  I will stop ASA and hold eliquis for 24 hours prior to the procedure.  He will need to call 911 and report urgently to [facility] for any symptomatic arrhythmias in the interim.  2. Hypertensive cardiovascular disease Recently has had issues with elevated BP Better today Will reassess post PPM  3. Afib/ atypical atrial flutter/ atrial tachycardia In atach today with  2:1 conduction Our ability to rate control is reduced due to AV block No changes today Hold eliquis 24 hours prior to PPM chads2vasc score is 5 Stop ASA  4. CAD No ischemic symptoms No changes   Current medicines are reviewed at length with the patient today.   The patient does not have concerns regarding his medicines.  The following changes were made today:  none    Signed, Thompson Grayer, MD  07/23/2017 10:58 AM     [facility] [address] [place]  [address] [phone] (office) [phone] (fax)

## 2017-07-24 ENCOUNTER — Telehealth: Payer: Self-pay

## 2017-07-24 DIAGNOSIS — E876 Hypokalemia: Secondary | ICD-10-CM

## 2017-07-24 LAB — CBC WITH DIFFERENTIAL/PLATELET
BASOS ABS: 0 10*3/uL (ref 0.0–0.2)
BASOS: 0 %
EOS (ABSOLUTE): 0.1 10*3/uL (ref 0.0–0.4)
Eos: 2 %
HEMATOCRIT: 40 % (ref 37.5–51.0)
HEMOGLOBIN: 13.3 g/dL (ref 13.0–17.7)
Immature Grans (Abs): 0 10*3/uL (ref 0.0–0.1)
Immature Granulocytes: 0 %
LYMPHS: 32 %
Lymphocytes Absolute: 2.4 10*3/uL (ref 0.7–3.1)
MCH: 29.6 pg (ref 26.6–33.0)
MCHC: 33.3 g/dL (ref 31.5–35.7)
MCV: 89 fL (ref 79–97)
MONOS ABS: 0.7 10*3/uL (ref 0.1–0.9)
Monocytes: 10 %
NEUTROS ABS: 4.3 10*3/uL (ref 1.4–7.0)
Neutrophils: 56 %
Platelets: 275 10*3/uL (ref 150–379)
RBC: 4.49 x10E6/uL (ref 4.14–5.80)
RDW: 15.9 % — ABNORMAL HIGH (ref 12.3–15.4)
WBC: 7.6 10*3/uL (ref 3.4–10.8)

## 2017-07-24 LAB — BASIC METABOLIC PANEL
BUN/Creatinine Ratio: 18 (ref 10–24)
BUN: 34 mg/dL — ABNORMAL HIGH (ref 8–27)
CALCIUM: 9.1 mg/dL (ref 8.6–10.2)
CHLORIDE: 106 mmol/L (ref 96–106)
CO2: 20 mmol/L (ref 20–29)
Creatinine, Ser: 1.89 mg/dL — ABNORMAL HIGH (ref 0.76–1.27)
GFR, EST AFRICAN AMERICAN: 36 mL/min/{1.73_m2} — AB (ref >59)
GFR, EST NON AFRICAN AMERICAN: 31 mL/min/{1.73_m2} — AB (ref >59)
Glucose: 105 mg/dL — ABNORMAL HIGH (ref 65–99)
Potassium: 3.4 mmol/L — ABNORMAL LOW (ref 3.5–5.2)
SODIUM: 140 mmol/L (ref 134–144)

## 2017-07-24 MED ORDER — POTASSIUM CHLORIDE CRYS ER 20 MEQ PO TBCR: 20.0000 meq | EXTENDED_RELEASE_TABLET | Freq: Every day | ORAL | 3 refills | Status: DC

## 2017-07-24 NOTE — Telephone Encounter (Signed)
Potassium 20 meq daily called in to pharmacy.

## 2017-07-27 ENCOUNTER — Other Ambulatory Visit: Payer: Self-pay

## 2017-07-27 DIAGNOSIS — E876 Hypokalemia: Secondary | ICD-10-CM

## 2017-07-27 NOTE — Progress Notes (Signed)
Pt to have f/u bmp after adding potassium

## 2017-07-28 ENCOUNTER — Encounter (HOSPITAL_COMMUNITY)
Admission: AD | Disposition: A | Discharge status: Home / Self Care | Payer: Self-pay | Source: Home / Self Care | Attending: Internal Medicine

## 2017-07-28 ENCOUNTER — Other Ambulatory Visit: Payer: Self-pay

## 2017-07-28 ENCOUNTER — Encounter (HOSPITAL_COMMUNITY): Payer: Self-pay

## 2017-07-28 ENCOUNTER — Ambulatory Visit: Payer: Medicare Other | Admitting: Cardiovascular Disease

## 2017-07-28 ENCOUNTER — Ambulatory Visit (HOSPITAL_COMMUNITY)
Admission: AD | Admit: 2017-07-28 | Discharge: 2017-07-29 | Disposition: A | Discharge status: Home / Self Care | Payer: Medicare Other | Attending: Internal Medicine | Admitting: Internal Medicine

## 2017-07-28 DIAGNOSIS — E1151 Type 2 diabetes mellitus with diabetic peripheral angiopathy without gangrene: Secondary | ICD-10-CM | POA: Insufficient documentation

## 2017-07-28 DIAGNOSIS — Z87891 Personal history of nicotine dependence: Secondary | ICD-10-CM | POA: Insufficient documentation

## 2017-07-28 DIAGNOSIS — Z8249 Family history of ischemic heart disease and other diseases of the circulatory system: Secondary | ICD-10-CM | POA: Insufficient documentation

## 2017-07-28 DIAGNOSIS — E1122 Type 2 diabetes mellitus with diabetic chronic kidney disease: Secondary | ICD-10-CM | POA: Insufficient documentation

## 2017-07-28 DIAGNOSIS — I441 Atrioventricular block, second degree: Secondary | ICD-10-CM | POA: Diagnosis present

## 2017-07-28 DIAGNOSIS — N183 Chronic kidney disease, stage 3 (moderate): Secondary | ICD-10-CM | POA: Diagnosis not present

## 2017-07-28 DIAGNOSIS — Z959 Presence of cardiac and vascular implant and graft, unspecified: Secondary | ICD-10-CM

## 2017-07-28 DIAGNOSIS — Z7982 Long term (current) use of aspirin: Secondary | ICD-10-CM | POA: Diagnosis not present

## 2017-07-28 DIAGNOSIS — I1310 Hypertensive heart and chronic kidney disease without heart failure, with stage 1 through stage 4 chronic kidney disease, or unspecified chronic kidney disease: Secondary | ICD-10-CM | POA: Insufficient documentation

## 2017-07-28 DIAGNOSIS — I471 Supraventricular tachycardia: Secondary | ICD-10-CM | POA: Diagnosis not present

## 2017-07-28 DIAGNOSIS — M109 Gout, unspecified: Secondary | ICD-10-CM | POA: Diagnosis not present

## 2017-07-28 DIAGNOSIS — Z7901 Long term (current) use of anticoagulants: Secondary | ICD-10-CM | POA: Diagnosis not present

## 2017-07-28 DIAGNOSIS — I480 Paroxysmal atrial fibrillation: Secondary | ICD-10-CM | POA: Insufficient documentation

## 2017-07-28 DIAGNOSIS — H3530 Unspecified macular degeneration: Secondary | ICD-10-CM | POA: Diagnosis not present

## 2017-07-28 DIAGNOSIS — I484 Atypical atrial flutter: Secondary | ICD-10-CM | POA: Insufficient documentation

## 2017-07-28 DIAGNOSIS — E785 Hyperlipidemia, unspecified: Secondary | ICD-10-CM | POA: Diagnosis not present

## 2017-07-28 DIAGNOSIS — I2510 Atherosclerotic heart disease of native coronary artery without angina pectoris: Secondary | ICD-10-CM | POA: Diagnosis not present

## 2017-07-28 DIAGNOSIS — Z951 Presence of aortocoronary bypass graft: Secondary | ICD-10-CM | POA: Insufficient documentation

## 2017-07-28 DIAGNOSIS — Z880 Allergy status to penicillin: Secondary | ICD-10-CM | POA: Diagnosis not present

## 2017-07-28 HISTORY — PX: PACEMAKER IMPLANT: EP1218

## 2017-07-28 LAB — GLUCOSE, CAPILLARY: GLUCOSE-CAPILLARY: 101 mg/dL — AB (ref 65–99)

## 2017-07-28 LAB — SURGICAL PCR SCREEN
MRSA, PCR: NEGATIVE
Staphylococcus aureus: NEGATIVE

## 2017-07-28 SURGERY — PACEMAKER IMPLANT

## 2017-07-28 MED ORDER — SODIUM CHLORIDE 0.9% FLUSH: 3.0000 mL | INTRAVENOUS | Status: DC | PRN

## 2017-07-28 MED ORDER — VANCOMYCIN HCL IN DEXTROSE 1-5 GM/200ML-% IV SOLN
1000.0000 mg | Freq: Two times a day (BID) | INTRAVENOUS | Status: AC
  Administered 2017-07-29: 1000 mg via INTRAVENOUS
  Filled 2017-07-28: qty 200

## 2017-07-28 MED ORDER — CHLORHEXIDINE GLUCONATE 4 % EX LIQD: 60.0000 mL | Freq: Once | CUTANEOUS | Status: DC

## 2017-07-28 MED ORDER — LIDOCAINE HCL (PF) 1 % IJ SOLN
INTRAMUSCULAR | Status: AC
  Filled 2017-07-28: qty 30

## 2017-07-28 MED ORDER — TAMSULOSIN HCL 0.4 MG PO CAPS
0.4000 mg | ORAL_CAPSULE | Freq: Every day | ORAL | Status: DC
  Administered 2017-07-28: 0.4 mg via ORAL
  Filled 2017-07-28: qty 1

## 2017-07-28 MED ORDER — LIDOCAINE HCL (PF) 1 % IJ SOLN
INTRAMUSCULAR | Status: DC | PRN
  Administered 2017-07-28: 60 mL

## 2017-07-28 MED ORDER — SODIUM CHLORIDE 0.9% FLUSH
3.0000 mL | Freq: Two times a day (BID) | INTRAVENOUS | Status: DC
  Administered 2017-07-29: 3 mL via INTRAVENOUS

## 2017-07-28 MED ORDER — MIDAZOLAM HCL 5 MG/5ML IJ SOLN
INTRAMUSCULAR | Status: AC
  Filled 2017-07-28: qty 5

## 2017-07-28 MED ORDER — MIDAZOLAM HCL 5 MG/5ML IJ SOLN
INTRAMUSCULAR | Status: DC | PRN
  Administered 2017-07-28: 1 mg via INTRAVENOUS

## 2017-07-28 MED ORDER — ONDANSETRON HCL 4 MG/2ML IJ SOLN: 4.0000 mg | Freq: Four times a day (QID) | INTRAMUSCULAR | Status: DC | PRN

## 2017-07-28 MED ORDER — VANCOMYCIN HCL IN DEXTROSE 1-5 GM/200ML-% IV SOLN
INTRAVENOUS | Status: AC
  Filled 2017-07-28: qty 200

## 2017-07-28 MED ORDER — GENTAMICIN SULFATE 40 MG/ML IJ SOLN
INTRAMUSCULAR | Status: AC
  Filled 2017-07-28: qty 2

## 2017-07-28 MED ORDER — GENTAMICIN SULFATE 40 MG/ML IJ SOLN
80.0000 mg | INTRAMUSCULAR | Status: AC
  Administered 2017-07-28: 80 mg

## 2017-07-28 MED ORDER — VANCOMYCIN HCL IN DEXTROSE 1-5 GM/200ML-% IV SOLN
1000.0000 mg | INTRAVENOUS | Status: AC
  Administered 2017-07-28: 1000 mg via INTRAVENOUS

## 2017-07-28 MED ORDER — AMLODIPINE BESYLATE 5 MG PO TABS
5.0000 mg | ORAL_TABLET | Freq: Every day | ORAL | Status: DC
  Administered 2017-07-28 – 2017-07-29 (×2): 5 mg via ORAL
  Filled 2017-07-28 (×2): qty 1

## 2017-07-28 MED ORDER — SODIUM CHLORIDE 0.9 % IV SOLN
INTRAVENOUS | Status: DC
  Administered 2017-07-28: 14:00:00 via INTRAVENOUS

## 2017-07-28 MED ORDER — FENTANYL CITRATE (PF) 100 MCG/2ML IJ SOLN
INTRAMUSCULAR | Status: AC
  Filled 2017-07-28: qty 2

## 2017-07-28 MED ORDER — HYDROCODONE-ACETAMINOPHEN 5-325 MG PO TABS
1.0000 | ORAL_TABLET | ORAL | Status: DC | PRN
  Administered 2017-07-29: 1 via ORAL
  Filled 2017-07-28: qty 1

## 2017-07-28 MED ORDER — HEPARIN (PORCINE) IN NACL 2-0.9 UNIT/ML-% IJ SOLN
INTRAMUSCULAR | Status: AC
  Filled 2017-07-28: qty 500

## 2017-07-28 MED ORDER — MUPIROCIN 2 % EX OINT
1.0000 "application " | TOPICAL_OINTMENT | Freq: Once | CUTANEOUS | Status: AC
  Administered 2017-07-28: 1 via TOPICAL

## 2017-07-28 MED ORDER — MUPIROCIN 2 % EX OINT
TOPICAL_OINTMENT | CUTANEOUS | Status: AC
  Administered 2017-07-28: 1 via TOPICAL
  Filled 2017-07-28: qty 22

## 2017-07-28 MED ORDER — SODIUM CHLORIDE 0.9 % IV SOLN: 250.0000 mL | INTRAVENOUS | Status: DC | PRN

## 2017-07-28 MED ORDER — ALLOPURINOL 300 MG PO TABS
150.0000 mg | ORAL_TABLET | Freq: Every day | ORAL | Status: DC
  Administered 2017-07-29: 150 mg via ORAL
  Filled 2017-07-28 (×2): qty 1

## 2017-07-28 MED ORDER — POTASSIUM CHLORIDE CRYS ER 20 MEQ PO TBCR
20.0000 meq | EXTENDED_RELEASE_TABLET | Freq: Every day | ORAL | Status: DC
Start: 2017-07-29 — End: 2017-07-29
  Administered 2017-07-29: 20 meq via ORAL
  Filled 2017-07-28: qty 1

## 2017-07-28 MED ORDER — HEPARIN (PORCINE) IN NACL 2-0.9 UNIT/ML-% IJ SOLN
INTRAMUSCULAR | Status: AC | PRN
  Administered 2017-07-28: 500 mL

## 2017-07-28 MED ORDER — ACETAMINOPHEN 325 MG PO TABS
325.0000 mg | ORAL_TABLET | ORAL | Status: DC | PRN
  Administered 2017-07-28: 650 mg via ORAL
  Filled 2017-07-28: qty 2

## 2017-07-28 SURGICAL SUPPLY — 7 items
CABLE SURGICAL S-101-97-12 (CABLE) ×2 IMPLANT
LEAD TENDRIL MRI 52CM LPA1200M (Lead) ×2 IMPLANT
LEAD TENDRIL MRI 58CM LPA1200M (Lead) ×2 IMPLANT
PACEMAKER ASSURITY DR-RF (Pacemaker) ×2 IMPLANT
PAD DEFIB LIFELINK (PAD) ×2 IMPLANT
SHEATH CLASSIC 8F (SHEATH) ×4 IMPLANT
TRAY PACEMAKER INSERTION (PACKS) ×2 IMPLANT

## 2017-07-28 NOTE — Interval H&P Note (Signed)
History and Physical Interval Note:  07/28/2017 2:03 PM  [name]  has presented today for surgery, with the diagnosis of av block  The various methods of treatment have been discussed with the patient and family. After consideration of risks, benefits and other options for treatment, the patient has consented to  Procedure(s): PACEMAKER IMPLANT (N/A) as a surgical intervention .  The patient's history has been reviewed, patient examined, no change in status, stable for surgery.  I have reviewed the patient's chart and labs.  Questions were answered to the patient's satisfaction.     Thompson Grayer

## 2017-07-28 NOTE — Discharge Summary (Addendum)
ELECTROPHYSIOLOGY PROCEDURE DISCHARGE SUMMARY    Patient ID: [name],  MRN: [id number], DOB/AGE: [birth year] 84 y.o.  Admit date: 07/28/2017 Discharge date: 07/29/17  Primary Care Physician: [name], DO  Primary Cardiologist: Dr. Johnsie Cancel Electrophysiologist: Dr. Rayann Heman  Primary Discharge Diagnosis:  1. Symptomatic bradycardia status post pacemaker implantation this admission  Secondary Discharge Diagnosis:  1. Paroxysmal AFib     CHA2DS2Vasc is 4, on Eliquis, appropriately dosed at 2.5mg  for age/Creat 2. HTN 3. DM  Allergies  Allergen Reactions  . Penicillins Hives    Has patient had a PCN reaction causing immediate rash, facial/tongue/throat swelling, SOB or lightheadedness with hypotension: No Has patient had a PCN reaction causing severe rash involving mucus membranes or skin necrosis: Yes Has patient had a PCN reaction that required hospitalization: No Has patient had a PCN reaction occurring within the last 10 years: No If all of the above answers are "NO", then may proceed with Cephalosporin use.      Procedures This Admission:  1.  Implantation of a SJM dual chamber PPM on 07/28/17 by Dr Rayann Heman.  The patient received a Atlantis MRI model [id number] (serial number  [id number]) pacemaker, [employer] Tendril MRI model [id number]- 52 (serial number  [id number]) right atrial lead and a [employer] Tendril MRI model [id number]  (serial number  [id number]) right ventricular lead  There were no immediate post procedure complications. 2.  CXR on 07/29/17 demonstrated no pneumothorax status post device implantation.   Brief HPI: [name] is a 84 y.o. male was referred to electrophysiology in the outpatient setting for consideration of PPM implantation.  Past medical history includes PAFib, HTN, DM.  The patient has had symptomatic bradycardia without reversible causes identified.  Risks, benefits, and alternatives to PPM implantation were  reviewed with the patient who wished to proceed.   Hospital Course:  The patient was admitted and underwent implantation of a PPM with details as outlined above. He  was monitored on telemetry overnight which demonstrated SR, intermittent V pacing.  Left chest was without hematoma or ecchymosis.  The device was interrogated and found to be functioning normally.  CXR was obtained and demonstrated no pneumothorax status post device implantation.  Patient is feeling well, no CP or SOB.  Wound care, arm mobility, and restrictions were reviewed with the patient.  The patient was examined by Dr. Rayann Heman and considered stable for discharge to home.    Physical Exam: Vitals:   07/28/17 1946 07/28/17 2002 07/29/17 0604 07/29/17 0845  BP: (!) 163/57 (!) 168/56 (!) 187/65 (!) 147/50  Pulse: 74 72 75 71  Resp:   18   Temp: 98 F (36.7 C)  98 F (36.7 C)   TempSrc: Oral  Oral   SpO2: 98% 97% 98% 99%  Weight:   147 lb 3.2 oz (66.8 kg)   Height:        GEN- The patient is well appearing, alert and oriented x 3 today.   HEENT: normocephalic, atraumatic; sclera clear, conjunctiva pink; hearing intact; oropharynx clear; neck supple, no JVP Lungs- CTA b/l, normal work of breathing.  No wheezes, rales, rhonchi Heart- RRR, no murmurs, rubs or gallops, PMI not laterally displaced GI- soft, non-tender, non-distended Extremities- no clubbing, cyanosis, or edema MS- no significant deformity or atrophy Skin- warm and dry, no rash or lesion,  left chest without hematoma/ecchymosis Psych- euthymic mood, full affect Neuro- no gross deficits   Labs:   Lab  Results  Component Value Date   WBC 7.6 07/23/2017   HGB 13.3 07/23/2017   HCT 40.0 07/23/2017   MCV 89 07/23/2017   PLT 275 07/23/2017    Recent Labs  Lab 07/23/17 1152  NA 140  K 3.4*  CL 106  CO2 20  BUN 34*  CREATININE 1.89*  CALCIUM 9.1  GLUCOSE 105*    Discharge Medications:  Allergies as of 07/29/2017      Reactions   Penicillins  Hives   Has patient had a PCN reaction causing immediate rash, facial/tongue/throat swelling, SOB or lightheadedness with hypotension: No Has patient had a PCN reaction causing severe rash involving mucus membranes or skin necrosis: Yes Has patient had a PCN reaction that required hospitalization: No Has patient had a PCN reaction occurring within the last 10 years: No If all of the above answers are "NO", then may proceed with Cephalosporin use.      Medication List    TAKE these medications   ACCU-CHEK FASTCLIX LANCETS Misc Check fasting blood sugar once daily   acetaminophen 500 MG tablet Commonly known as:  TYLENOL Take 500 mg by mouth every 6 (six) hours as needed (for pain.).   allopurinol 300 MG tablet Commonly known as:  ZYLOPRIM Take 0.5 tablets (150 mg total) by mouth daily.   Aloe Vera 25 MG Caps Take 25 mg by mouth 2 (two) times daily.   amLODipine 5 MG tablet Commonly known as:  NORVASC Take 1 tablet (5 mg total) by mouth daily.   atorvastatin 20 MG tablet Commonly known as:  LIPITOR Take 1 tablet (20 mg total) by mouth at bedtime.   cholecalciferol 1000 units tablet Commonly known as:  VITAMIN D Take 1,000 Units by mouth daily.   CHROMIUM GTF PO Take 2 capsules by mouth every evening.   Cinnamon 500 MG Tabs Take 2 tablets by mouth at bedtime.   Co Q 10 100 MG Caps Take 100 mg by mouth daily.   dicyclomine 10 MG capsule Commonly known as:  BENTYL Take 1 capsule (10 mg total) by mouth 2 (two) times daily.   ELIQUIS 2.5 MG Tabs tablet Generic drug:  apixaban TAKE 1 TABLET TWICE A DAY Notes to patient:  Resume Friday 07/31/17 morning dose   fish oil-omega-3 fatty acids 1000 MG capsule Take 1 g by mouth daily.   furosemide 20 MG tablet Commonly known as:  LASIX Take 1 tablet (20 mg total) by mouth as directed. What changed:  when to take this   glucose blood test strip Commonly known as:  ACCU-CHEK GUIDE Check fasting blood sugar once daily     potassium chloride SA 20 MEQ tablet Commonly known as:  K-DUR,KLOR-CON Take 1 tablet (20 mEq total) by mouth daily.   PRESERVISION AREDS PO Take 1 tablet by mouth 2 (two) times daily.   PROBIOTIC PO Take 1 tablet by mouth daily.   tamsulosin 0.4 MG Caps capsule Commonly known as:  FLOMAX TAKE 1 CAPSULE BY MOUTH ONCE DAILY AFTER SUPPER   VANADYL SULFATE PO Take 1 capsule by mouth every evening.   zinc gluconate 50 MG tablet Take 50 mg by mouth daily.       Disposition:  Home  Discharge Instructions    Diet - low sodium heart healthy   Complete by:  As directed    Increase activity slowly   Complete by:  As directed      Follow-up Information    [facility] Follow  up on 08/11/2017.   Specialty:  Cardiology Why:  3:00PM, wound check visit Contact information: [address], [place]       Thompson Grayer, MD Follow up on 11/02/2017.   Specialty:  Cardiology Why:  10:00AM Contact information: [place] [address] [phone]           Duration of Discharge Encounter: Greater than 30 minutes including physician time.  Signed, Tommye Standard, PA-C 07/29/2017 10:18 AM  I have seen, examined the patient, and reviewed the above assessment and plan.  Changes to above are made where necessary.  Device interrogation is personally reviewed and normal.  CXR reveals stable leads, no ptx  Co Sign: Thompson Grayer, MD 07/29/2017

## 2017-07-29 ENCOUNTER — Ambulatory Visit (HOSPITAL_COMMUNITY): Payer: Medicare Other

## 2017-07-29 ENCOUNTER — Other Ambulatory Visit: Payer: Self-pay

## 2017-07-29 ENCOUNTER — Encounter (HOSPITAL_COMMUNITY): Payer: Self-pay

## 2017-07-29 DIAGNOSIS — M109 Gout, unspecified: Secondary | ICD-10-CM | POA: Diagnosis not present

## 2017-07-29 DIAGNOSIS — I1310 Hypertensive heart and chronic kidney disease without heart failure, with stage 1 through stage 4 chronic kidney disease, or unspecified chronic kidney disease: Secondary | ICD-10-CM | POA: Diagnosis not present

## 2017-07-29 DIAGNOSIS — I480 Paroxysmal atrial fibrillation: Secondary | ICD-10-CM | POA: Diagnosis not present

## 2017-07-29 DIAGNOSIS — E785 Hyperlipidemia, unspecified: Secondary | ICD-10-CM | POA: Diagnosis not present

## 2017-07-29 DIAGNOSIS — E1122 Type 2 diabetes mellitus with diabetic chronic kidney disease: Secondary | ICD-10-CM | POA: Diagnosis not present

## 2017-07-29 DIAGNOSIS — E1151 Type 2 diabetes mellitus with diabetic peripheral angiopathy without gangrene: Secondary | ICD-10-CM | POA: Diagnosis not present

## 2017-07-29 DIAGNOSIS — Z950 Presence of cardiac pacemaker: Secondary | ICD-10-CM | POA: Diagnosis not present

## 2017-07-29 DIAGNOSIS — I471 Supraventricular tachycardia: Secondary | ICD-10-CM | POA: Diagnosis not present

## 2017-07-29 DIAGNOSIS — I441 Atrioventricular block, second degree: Secondary | ICD-10-CM | POA: Diagnosis not present

## 2017-07-29 DIAGNOSIS — I2510 Atherosclerotic heart disease of native coronary artery without angina pectoris: Secondary | ICD-10-CM | POA: Diagnosis not present

## 2017-07-29 DIAGNOSIS — H3530 Unspecified macular degeneration: Secondary | ICD-10-CM | POA: Diagnosis not present

## 2017-07-29 DIAGNOSIS — I484 Atypical atrial flutter: Secondary | ICD-10-CM | POA: Diagnosis not present

## 2017-07-29 DIAGNOSIS — N183 Chronic kidney disease, stage 3 (moderate): Secondary | ICD-10-CM | POA: Diagnosis not present

## 2017-07-29 LAB — GLUCOSE, CAPILLARY: GLUCOSE-CAPILLARY: 120 mg/dL — AB (ref 65–99)

## 2017-07-29 NOTE — Progress Notes (Signed)
Reviewed discharge instructions with patient and patient's wife. Answered their questions. Patient is stable and ready for discharge.

## 2017-07-29 NOTE — Discharge Instructions (Addendum)
Supplemental Discharge Instructions for  Pacemaker/Defibrillator Patients  Activity No heavy lifting or vigorous activity with your left/right arm for 6 to 8 weeks.  Do not raise your left/right arm above your head for one week.  Gradually raise your affected arm as drawn below.              08/01/17                      08/02/17                       08/03/17                    08/04/17 __  NO DRIVING for  1 week  ; you may begin driving on  10/02/65   .  WOUND CARE - Keep the wound area clean and dry.  Do not get this area wet, no showers untiil cleared to at your wound check visit . - The tape/steri-strips on your wound will fall off; do not pull them off.  No bandage is needed on the site.  DO  NOT apply any creams, oils, or ointments to the wound area. - If you notice any drainage or discharge from the wound, any swelling or bruising at the site, or you develop a fever > 101? F after you are discharged home, call the office at once.  Special Instructions - You are still able to use cellular telephones; use the ear opposite the side where you have your pacemaker/defibrillator.  Avoid carrying your cellular phone near your device. - When traveling through airports, show security personnel your identification card to avoid being screened in the metal detectors.  Ask the security personnel to use the hand wand. - Avoid arc welding equipment, MRI testing (magnetic resonance imaging), TENS units (transcutaneous nerve stimulators).  Call the office for questions about other devices. - Avoid electrical appliances that are in poor condition or are not properly grounded. - Microwave ovens are safe to be near or to operate.  Additional information for defibrillator patients should your device go off: - If your device goes off ONCE and you feel fine afterward, notify the device clinic nurses. - If your device goes off ONCE and you do not feel well afterward, call 911. - If your device goes off TWICE,  call 911. - If your device goes off THREE times in one day, call 911.  DO NOT DRIVE YOURSELF OR A FAMILY MEMBER WITH A DEFIBRILLATOR TO THE HOSPITAL--CALL 911.   Information on my medicine - ELIQUIS (apixaban)  This medication education was reviewed with me or my healthcare representative as part of my discharge preparation.  The pharmacist that spoke with me during my hospital stay was:  Saundra Shelling, [facility]  Why was Eliquis prescribed for you? Eliquis was prescribed for you to reduce the risk of a blood clot forming that can cause a stroke if you have a medical condition called atrial fibrillation (a type of irregular heartbeat).  What do You need to know about Eliquis ? Take your Eliquis TWICE DAILY - one tablet in the morning and one tablet in the evening with or without food. If you have difficulty swallowing the tablet whole please discuss with your pharmacist how to take the medication safely.  Take Eliquis exactly as prescribed by your doctor and DO NOT stop taking Eliquis without talking to the doctor who prescribed the medication.  Stopping may increase your risk of developing a stroke.  Refill your prescription before you run out.  After discharge, you should have regular check-up appointments with your healthcare provider that is prescribing your Eliquis.  In the future your dose may need to be changed if your kidney function or weight changes by a significant amount or as you get older.  What do you do if you miss a dose? If you miss a dose, take it as soon as you remember on the same day and resume taking twice daily.  Do not take more than one dose of ELIQUIS at the same time to make up a missed dose.  Important Safety Information A possible side effect of Eliquis is bleeding. You should call your healthcare provider right away if you experience any of the following: ? Bleeding from an injury or your nose that does not stop. ? Unusual colored urine (red or dark  brown) or unusual colored stools (red or black). ? Unusual bruising for unknown reasons. ? A serious fall or if you hit your head (even if there is no bleeding).  Some medicines may interact with Eliquis and might increase your risk of bleeding or clotting while on Eliquis. To help avoid this, consult your healthcare provider or pharmacist prior to using any new prescription or non-prescription medications, including herbals, vitamins, non-steroidal anti-inflammatory drugs (NSAIDs) and supplements.  This website has more information on Eliquis (apixaban): http://www.eliquis.com/eliquis/home

## 2017-08-01 ENCOUNTER — Telehealth: Payer: Self-pay | Admitting: Internal Medicine

## 2017-08-01 NOTE — Telephone Encounter (Signed)
Pt called stating that his pulse was "irregular".  Denies dizziness, CP, SOB, or any other concerns.  BP was 160/66, HR 80 (irr).  I instructed him to take coreg 6.25mg  once now.  He can take 6.25mg  BID going forward  I will device clinic team call patient on Monday for manual device transmission.  Thompson Grayer MD, [facility] 08/01/2017 11:52 AM

## 2017-08-03 NOTE — Telephone Encounter (Signed)
Reviewed remote transmission. 12 AMS episodes (<1%0 - available EGMs show AF, longest x ~59min. Presenting rhythm NSR @ 76.

## 2017-08-03 NOTE — Telephone Encounter (Signed)
Spoke with patient who states that he has nto experienced any more "irregular" HR since 4/6. He confirmed that he is taking the Coreg BID. I walked patient through how to send a remote transmission. I will review upon receipt and call back if there are any additional recommendations. Wound check appt confirmed for 4/16. Patient was agreeable to this plan.

## 2017-08-04 ENCOUNTER — Telehealth: Payer: Self-pay

## 2017-08-04 NOTE — Telephone Encounter (Signed)
Call placed to Pt.  Notified Pt per Dr. Rayann Heman he should discontinue potassium at this time.   Advised Pt to set aside, he may need it again at some point.  Pt indicates understanding.

## 2017-08-11 ENCOUNTER — Ambulatory Visit (INDEPENDENT_AMBULATORY_CARE_PROVIDER_SITE_OTHER): Payer: Medicare Other | Admitting: *Deleted

## 2017-08-11 ENCOUNTER — Other Ambulatory Visit: Payer: Medicare Other | Admitting: *Deleted

## 2017-08-11 DIAGNOSIS — I441 Atrioventricular block, second degree: Secondary | ICD-10-CM

## 2017-08-11 DIAGNOSIS — E876 Hypokalemia: Secondary | ICD-10-CM

## 2017-08-11 LAB — CUP PACEART INCLINIC DEVICE CHECK
Battery Remaining Longevity: 126 mo
Battery Voltage: 3.07 V
Brady Statistic RV Percent Paced: 83 %
Date Time Interrogation Session: 20190416154520
Implantable Lead Implant Date: 20190402
Implantable Pulse Generator Implant Date: 20190402
Lead Channel Impedance Value: 412.5 Ohm
Lead Channel Impedance Value: 525 Ohm
Lead Channel Pacing Threshold Amplitude: 0.5 V
Lead Channel Pacing Threshold Amplitude: 0.5 V
Lead Channel Pacing Threshold Amplitude: 0.75 V
Lead Channel Pacing Threshold Pulse Width: 0.5 ms
Lead Channel Pacing Threshold Pulse Width: 0.5 ms
Lead Channel Setting Pacing Amplitude: 0.75 V
Lead Channel Setting Sensing Sensitivity: 2 mV
MDC IDC LEAD IMPLANT DT: 20190402
MDC IDC LEAD LOCATION: 753859
MDC IDC LEAD LOCATION: 753860
MDC IDC MSMT LEADCHNL RA PACING THRESHOLD PULSEWIDTH: 0.5 ms
MDC IDC MSMT LEADCHNL RA SENSING INTR AMPL: 3.1 mV
MDC IDC MSMT LEADCHNL RV PACING THRESHOLD AMPLITUDE: 0.75 V
MDC IDC MSMT LEADCHNL RV PACING THRESHOLD PULSEWIDTH: 0.5 ms
MDC IDC MSMT LEADCHNL RV SENSING INTR AMPL: 12 mV
MDC IDC SET LEADCHNL RA PACING AMPLITUDE: 3.5 V
MDC IDC SET LEADCHNL RV PACING PULSEWIDTH: 0.5 ms
MDC IDC STAT BRADY RA PERCENT PACED: 5.6 %
Pulse Gen Model: 2272
Pulse Gen Serial Number: 9003454

## 2017-08-11 LAB — BASIC METABOLIC PANEL
BUN/Creatinine Ratio: 15 (ref 10–24)
BUN: 27 mg/dL (ref 8–27)
CALCIUM: 8.7 mg/dL (ref 8.6–10.2)
CHLORIDE: 107 mmol/L — AB (ref 96–106)
CO2: 20 mmol/L (ref 20–29)
CREATININE: 1.8 mg/dL — AB (ref 0.76–1.27)
GFR, EST AFRICAN AMERICAN: 38 mL/min/{1.73_m2} — AB (ref >59)
GFR, EST NON AFRICAN AMERICAN: 33 mL/min/{1.73_m2} — AB (ref >59)
Glucose: 124 mg/dL — ABNORMAL HIGH (ref 65–99)
Potassium: 3.2 mmol/L — ABNORMAL LOW (ref 3.5–5.2)
Sodium: 141 mmol/L (ref 134–144)

## 2017-08-11 NOTE — Progress Notes (Signed)
Wound check appointment. Steri-strips removed. Wound without redness or edema. Incision edges approximated, wound well healed. Normal device function. Thresholds, sensing, and impedances consistent with implant measurements. Device programmed at 3.5V/auto capture programmed on for extra safety margin until 3 month visit. Histogram distribution appropriate for patient and level of activity. <1% AT/AF + Eliquis.No  high ventricular rates noted. Patient educated about wound care, arm mobility, lifting restrictions. ROV 11/02/17 w/JA.

## 2017-08-12 ENCOUNTER — Encounter: Payer: Self-pay | Admitting: Family Medicine

## 2017-08-12 ENCOUNTER — Ambulatory Visit (INDEPENDENT_AMBULATORY_CARE_PROVIDER_SITE_OTHER): Payer: Medicare Other | Admitting: Family Medicine

## 2017-08-12 VITALS — BP 157/63 | HR 67 | Temp 97.2°F | Ht 68.0 in | Wt 149.2 lb

## 2017-08-12 DIAGNOSIS — E876 Hypokalemia: Secondary | ICD-10-CM

## 2017-08-12 DIAGNOSIS — K580 Irritable bowel syndrome with diarrhea: Secondary | ICD-10-CM

## 2017-08-12 MED ORDER — DICYCLOMINE HCL 10 MG PO CAPS: 10.0000 mg | ORAL_CAPSULE | Freq: Three times a day (TID) | ORAL | 3 refills | Status: DC

## 2017-08-12 MED ORDER — DIPHENOXYLATE-ATROPINE 2.5-0.025 MG PO TABS: 1.0000 | ORAL_TABLET | Freq: Three times a day (TID) | ORAL | 0 refills | Status: DC | PRN

## 2017-08-12 NOTE — Patient Instructions (Addendum)
Bentyl--> increase dose during flares to every 6 hours if needed otherwise try to stay on twice a day.  Watch for any dizziness.   Just with flares: Lomotil start 2 tabs today, then use 1 tab every 8 hours as needed for severe diarrhea or flares.   Hydrate!!!  Watch your fluid level... You are tricky with fluid...   Diarrhea and lasix can mean dehydration and kidney/potassium problems.  Over Hydrating because of diarrhea, can mean increased fluid retention.  Watch your daily "dry weight" as indicator of fluid status.   FOBT cards if stools look dark and diarrhea continues.

## 2017-08-12 NOTE — Progress Notes (Signed)
[name] , March 22, 1929, 84 y.o., male MRN: [id number] Patient Care Team    Relationship Specialty Notifications Start End  [name], DO PCP - General Family Medicine  12/18/15   [name], MD PCP - Cardiology Cardiology  03/17/17   [name], MD Consulting Physician Ophthalmology  12/18/15   [name], MD Consulting Physician Dermatology  12/18/15   [name], MD Consulting Physician Cardiology  12/18/15   [name], MD Consulting Physician Ophthalmology  12/19/15   [name], MD Consulting Physician Oncology  09/30/16   [name], MD Attending Physician Nephrology  09/30/16   [name], MD Consulting Physician Dermatology  09/30/16     Chief Complaint  Patient presents with  . Diarrhea    Pt c/o diarrhea X 1 week. He said his regular bowel routine is twice daily but right now is more frequent than usual.     Subjective: Pt presents for an OV with complaints of return of his IBS-D of 1 week duration.  Associated symptoms include 3-4 watery stools a day. He does not believe they are bloody. He reports it is the same as his prior IBS flares.He is taking bentyl BID routinely. he reports this helps a great deal usually. However it is not enough right now. He denies recent travel, abx use or consuming under prepared or known contaminated meals. He denies fever, chills or abd pain. He is still taking his lasix. He had blood work completed yesterday which resulted with low potassium 3.2 and kidney fx stable. He states his cardiologist office called yesterday afternoon and told him to take his potassium supplement (which had been held bc he he had hyperkalemia at one time), pt has been following a lower potassium diet secondary to hyperkalemia. He dneies weakness, fatigue or dizziness.   Depression screen [facility] 2/9 09/30/2016 12/18/2015  Decreased Interest 0 0  Down, Depressed, Hopeless 0 0  PHQ - 2 Score 0 0    Allergies  Allergen Reactions  .  Penicillins Hives    Has patient had a PCN reaction causing immediate rash, facial/tongue/throat swelling, SOB or lightheadedness with hypotension: No Has patient had a PCN reaction causing severe rash involving mucus membranes or skin necrosis: Yes Has patient had a PCN reaction that required hospitalization: No Has patient had a PCN reaction occurring within the last 10 years: No If all of the above answers are "NO", then may proceed with Cephalosporin use.    Social History   Tobacco Use  . Smoking status: Former Smoker    Last attempt to quit: 12/25/1961    Years since quitting: 55.6  . Smokeless tobacco: Former Systems developer    Types: Chew    Quit date: 02/03/1969  Substance Use Topics  . Alcohol use: No    Alcohol/week: 0.0 oz   Past Medical History:  Diagnosis Date  . A-fib ([place])   . Basal cell carcinoma    skin  . Bigeminal rhythm   . Cataract   . Chronic renal insufficiency, stage 3 (moderate) (HCC) 2018   GFR 30s-40s  . Coronary artery disease    post bypass  . CVD (cardiovascular disease)   . Diabetes mellitus without complication ([place])   . Diverticular disease   . Easy bruising   . 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 Gout   . Hernia   . Hyperlipidemia   . Hypertension   . Iron deficiency anemia 02/04/2016  . Macular degeneration   .  Microscopic colitis   . PVD (peripheral vascular disease) ([place])    Past Surgical History:  Procedure Laterality Date  . CARDIAC CATHETERIZATION  2006  . CARDIOVERSION N/A 02/22/2013   Procedure: CARDIOVERSION;  Surgeon: [name], MD;  Location: [facility] ENDOSCOPY;  Service: Cardiovascular;  Laterality: N/A;  . CARDIOVERSION N/A 11/15/2014   Procedure: CARDIOVERSION;  Surgeon: [name], MD;  Location: [facility] ENDOSCOPY;  Service: Cardiovascular;  Laterality: N/A;  . CARDIOVERSION N/A 04/01/2017   Procedure: CARDIOVERSION;  Surgeon: Larey Dresser, MD;  Location: [place];  Service: Cardiovascular;  Laterality: N/A;  .  CAROTID ENDARTERECTOMY  2009/ 1993   left/ right  . CORONARY ARTERY BYPASS GRAFT  1999  . EYE SURGERY     eyelid repair  . INGUINAL HERNIA REPAIR  02/11/2012   Procedure: LAPAROSCOPIC BILATERAL INGUINAL HERNIA REPAIR;  Surgeon: Pedro Earls, MD;  Location: [facility] ORS;  Service: General;  Laterality: Bilateral;  . PACEMAKER IMPLANT N/A 07/28/2017   Procedure: PACEMAKER IMPLANT;  Surgeon: Thompson Grayer, MD;  Location: [facility];  Service: Cardiovascular;  Laterality: N/A;  . SKIN CANCER EXCISION     right ear x 3   Family History  Problem Relation Age of Onset  . Stroke Mother   . Coronary artery disease Father   . Heart disease Father   . Melanoma Sister    Allergies as of 08/12/2017      Reactions   Penicillins Hives   Has patient had a PCN reaction causing immediate rash, facial/tongue/throat swelling, SOB or lightheadedness with hypotension: No Has patient had a PCN reaction causing severe rash involving mucus membranes or skin necrosis: Yes Has patient had a PCN reaction that required hospitalization: No Has patient had a PCN reaction occurring within the last 10 years: No If all of the above answers are "NO", then may proceed with Cephalosporin use.      Medication List        Accurate as of 08/12/17 10:23 AM. Always use your most recent med list.          ACCU-CHEK FASTCLIX LANCETS Misc Check fasting blood sugar once daily   acetaminophen 500 MG tablet Commonly known as:  TYLENOL Take 500 mg by mouth every 6 (six) hours as needed (for pain.).   allopurinol 300 MG tablet Commonly known as:  ZYLOPRIM Take 0.5 tablets (150 mg total) by mouth daily.   Aloe Vera 25 MG Caps Take 25 mg by mouth 2 (two) times daily.   amLODipine 5 MG tablet Commonly known as:  NORVASC Take 1 tablet (5 mg total) by mouth daily.   atorvastatin 20 MG tablet Commonly known as:  LIPITOR Take 1 tablet (20 mg total) by mouth at bedtime.   carvedilol 6.25 MG tablet Commonly known  as:  COREG Take 3.125 mg by mouth 2 (two) times daily with a meal.   cholecalciferol 1000 units tablet Commonly known as:  VITAMIN D Take 1,000 Units by mouth daily.   CHROMIUM GTF PO Take 2 capsules by mouth every evening.   Cinnamon 500 MG Tabs Take 2 tablets by mouth at bedtime.   Co Q 10 100 MG Caps Take 100 mg by mouth daily.   dicyclomine 10 MG capsule Commonly known as:  BENTYL Take 1 capsule (10 mg total) by mouth 2 (two) times daily.   ELIQUIS 2.5 MG Tabs tablet Generic drug:  apixaban TAKE 1 TABLET TWICE A DAY   fish oil-omega-3 fatty acids 1000 MG  capsule Take 1 g by mouth daily.   furosemide 20 MG tablet Commonly known as:  LASIX Take 1 tablet (20 mg total) by mouth as directed.   glucose blood test strip Commonly known as:  ACCU-CHEK GUIDE Check fasting blood sugar once daily   PRESERVISION AREDS PO Take 1 tablet by mouth 2 (two) times daily.   PROBIOTIC PO Take 1 tablet by mouth daily.   tamsulosin 0.4 MG Caps capsule Commonly known as:  FLOMAX TAKE 1 CAPSULE BY MOUTH ONCE DAILY AFTER SUPPER   VANADYL SULFATE PO Take 1 capsule by mouth every evening.   zinc gluconate 50 MG tablet Take 50 mg by mouth daily.       All past medical history, surgical history, allergies, family history, immunizations andmedications were updated in the EMR today and reviewed under the history and medication portions of their EMR.     ROS: Negative, with the exception of above mentioned in HPI   Objective:  BP (!) 157/63 (BP Location: Left Arm, Patient Position: Sitting, Cuff Size: Normal)   Pulse 67   Temp (!) 97.2 F (36.2 C) (Oral)   Ht 5\' 8"  (1.727 m)   Wt 149 lb 3.2 oz (67.7 kg)   SpO2 98%   BMI 22.69 kg/m  Body mass index is 22.69 kg/m. Gen: Afebrile. No acute distress. Nontoxic in appearance, well developed, well nourished. Very pleasant caucasian male.  HENT: AT. [place]. MMM Eyes:Pupils Equal Round Reactive to light, Extraocular movements intact,   Conjunctiva without redness, discharge or icterus. Neck/lymp/endocrine: Supple,no lymphadenopathy CV: RRR, no edema Chest: CTAB, no wheeze or crackles.  Abd: Soft. NTND. BS present and normal. no Masses palpated. No rebound or guarding. .  Neuro:  Normal gait. PERLA. EOMi. Alert. Oriented x3  No exam data present No results found. No results found for this or any previous visit (from the past 24 hour(s)).  Assessment/Plan: SAHIL MILNER is a 84 y.o. male present for OV for  Irritable bowel syndrome with diarrhea/hypokalemia - dicyclomine (BENTYL) 10 MG capsule; Take 1 capsule (10 mg total) by mouth 4 (four) times daily -  before meals and at bedtime.  Dispense: 360 capsule; Refill: 3 - Hemoccult Cards (X3 cards); Future Bentyl--> increase dose during flares to every 6 hours if needed otherwise try to stay on twice a day.  Watch for any dizziness.  Just with flares: Lomotil start 2 tabs today, then use 1 tab every 8 hours as needed for severe diarrhea or flares.  Hydrate!!!  Caution with lasix use duringwith IBS_D flare. Discussed "dry weight" today as a guide to lasix use with his HD and IBSd. If loss of > 3-5 lbs during IBS flare he should contact cardio and either half or hold lasix until resolved or gains above dry weight. CMP collected yesterday reassuring for kidney fx.  Patient cautioned on dehydration and over hydration, he walks a fine line with both 2/2 to his CHF/HD, anemia and epo, CKD and his IBS-D when it flares. His current lower potassium level is likely secondary to his diarrhea and lasix use,  and should be monitored again in 1-2 weeks. Depending on the duration of his IBS flare. He is not certain if cardio is rechecking, if not he can come to have lab completed here in 2 weeks ([facility]-lab appt only). FOBT cards if stools look dark and/or diarrhea continues.  - F/U 1 week if not improved.  Reviewed expectations re: course of current medical issues.  Discussed self-management  of symptoms.  Outlined signs and symptoms indicating need for more acute intervention.  Patient verbalized understanding and all questions were answered.  Patient received an After-Visit Summary.    No orders of the defined types were placed in this encounter.    Note is dictated utilizing voice recognition software. Although note has been proof read prior to signing, occasional typographical errors still can be missed. If any questions arise, please do not hesitate to call for verification.   electronically signed by:  Howard Pouch, DO  [place]

## 2017-08-14 DIAGNOSIS — N390 Urinary tract infection, site not specified: Secondary | ICD-10-CM | POA: Diagnosis not present

## 2017-08-16 ENCOUNTER — Encounter (HOSPITAL_BASED_OUTPATIENT_CLINIC_OR_DEPARTMENT_OTHER): Payer: Self-pay | Admitting: Emergency Medicine

## 2017-08-16 ENCOUNTER — Other Ambulatory Visit: Payer: Self-pay

## 2017-08-16 ENCOUNTER — Emergency Department (HOSPITAL_BASED_OUTPATIENT_CLINIC_OR_DEPARTMENT_OTHER): Payer: Medicare Other

## 2017-08-16 ENCOUNTER — Emergency Department (HOSPITAL_BASED_OUTPATIENT_CLINIC_OR_DEPARTMENT_OTHER)
Admission: EM | Admit: 2017-08-16 | Discharge: 2017-08-16 | Disposition: A | Discharge status: Home / Self Care | Payer: Medicare Other | Attending: Emergency Medicine | Admitting: Emergency Medicine

## 2017-08-16 DIAGNOSIS — N183 Chronic kidney disease, stage 3 (moderate): Secondary | ICD-10-CM | POA: Insufficient documentation

## 2017-08-16 DIAGNOSIS — Z951 Presence of aortocoronary bypass graft: Secondary | ICD-10-CM | POA: Insufficient documentation

## 2017-08-16 DIAGNOSIS — M542 Cervicalgia: Secondary | ICD-10-CM | POA: Insufficient documentation

## 2017-08-16 DIAGNOSIS — N179 Acute kidney failure, unspecified: Secondary | ICD-10-CM | POA: Diagnosis not present

## 2017-08-16 DIAGNOSIS — Z7901 Long term (current) use of anticoagulants: Secondary | ICD-10-CM | POA: Insufficient documentation

## 2017-08-16 DIAGNOSIS — E1122 Type 2 diabetes mellitus with diabetic chronic kidney disease: Secondary | ICD-10-CM | POA: Diagnosis not present

## 2017-08-16 DIAGNOSIS — N189 Chronic kidney disease, unspecified: Secondary | ICD-10-CM

## 2017-08-16 DIAGNOSIS — Z87891 Personal history of nicotine dependence: Secondary | ICD-10-CM | POA: Insufficient documentation

## 2017-08-16 DIAGNOSIS — I130 Hypertensive heart and chronic kidney disease with heart failure and stage 1 through stage 4 chronic kidney disease, or unspecified chronic kidney disease: Secondary | ICD-10-CM | POA: Insufficient documentation

## 2017-08-16 DIAGNOSIS — I10 Essential (primary) hypertension: Secondary | ICD-10-CM | POA: Diagnosis not present

## 2017-08-16 DIAGNOSIS — I2510 Atherosclerotic heart disease of native coronary artery without angina pectoris: Secondary | ICD-10-CM | POA: Diagnosis not present

## 2017-08-16 DIAGNOSIS — I5032 Chronic diastolic (congestive) heart failure: Secondary | ICD-10-CM | POA: Diagnosis not present

## 2017-08-16 DIAGNOSIS — N390 Urinary tract infection, site not specified: Secondary | ICD-10-CM | POA: Diagnosis not present

## 2017-08-16 DIAGNOSIS — Z79899 Other long term (current) drug therapy: Secondary | ICD-10-CM | POA: Insufficient documentation

## 2017-08-16 DIAGNOSIS — N289 Disorder of kidney and ureter, unspecified: Secondary | ICD-10-CM | POA: Diagnosis not present

## 2017-08-16 DIAGNOSIS — R338 Other retention of urine: Secondary | ICD-10-CM | POA: Insufficient documentation

## 2017-08-16 DIAGNOSIS — Z85828 Personal history of other malignant neoplasm of skin: Secondary | ICD-10-CM | POA: Insufficient documentation

## 2017-08-16 DIAGNOSIS — E876 Hypokalemia: Secondary | ICD-10-CM | POA: Insufficient documentation

## 2017-08-16 DIAGNOSIS — Z950 Presence of cardiac pacemaker: Secondary | ICD-10-CM | POA: Insufficient documentation

## 2017-08-16 LAB — COMPREHENSIVE METABOLIC PANEL
ALBUMIN: 2.9 g/dL — AB (ref 3.5–5.0)
ALT: 57 U/L (ref 17–63)
AST: 62 U/L — AB (ref 15–41)
Alkaline Phosphatase: 89 U/L (ref 38–126)
Anion gap: 9 (ref 5–15)
BUN: 40 mg/dL — AB (ref 6–20)
CO2: 20 mmol/L — AB (ref 22–32)
CREATININE: 2.3 mg/dL — AB (ref 0.61–1.24)
Calcium: 8.4 mg/dL — ABNORMAL LOW (ref 8.9–10.3)
Chloride: 109 mmol/L (ref 101–111)
GFR calc Af Amer: 28 mL/min — ABNORMAL LOW (ref >60)
GFR calc non Af Amer: 24 mL/min — ABNORMAL LOW (ref >60)
GLUCOSE: 142 mg/dL — AB (ref 65–99)
Potassium: 2.7 mmol/L — CL (ref 3.5–5.1)
SODIUM: 138 mmol/L (ref 135–145)
Total Bilirubin: 0.7 mg/dL (ref 0.3–1.2)
Total Protein: 6.3 g/dL — ABNORMAL LOW (ref 6.5–8.1)

## 2017-08-16 LAB — URINALYSIS, MICROSCOPIC (REFLEX)

## 2017-08-16 LAB — CBC WITH DIFFERENTIAL/PLATELET
BASOS PCT: 0 %
Basophils Absolute: 0 10*3/uL (ref 0.0–0.1)
EOS PCT: 1 %
Eosinophils Absolute: 0.1 10*3/uL (ref 0.0–0.7)
HEMATOCRIT: 29.1 % — AB (ref 39.0–52.0)
Hemoglobin: 10.5 g/dL — ABNORMAL LOW (ref 13.0–17.0)
LYMPHS ABS: 1 10*3/uL (ref 0.7–4.0)
Lymphocytes Relative: 7 %
MCH: 30.4 pg (ref 26.0–34.0)
MCHC: 36.1 g/dL — ABNORMAL HIGH (ref 30.0–36.0)
MCV: 84.3 fL (ref 78.0–100.0)
MONO ABS: 1.4 10*3/uL — AB (ref 0.1–1.0)
Monocytes Relative: 10 %
Neutro Abs: 11.8 10*3/uL — ABNORMAL HIGH (ref 1.7–7.7)
Neutrophils Relative %: 82 %
PLATELETS: 175 10*3/uL (ref 150–400)
RBC: 3.45 MIL/uL — ABNORMAL LOW (ref 4.22–5.81)
RDW: 15.7 % — AB (ref 11.5–15.5)
WBC: 14.3 10*3/uL — ABNORMAL HIGH (ref 4.0–10.5)

## 2017-08-16 LAB — URINALYSIS, ROUTINE W REFLEX MICROSCOPIC
BILIRUBIN URINE: NEGATIVE
GLUCOSE, UA: NEGATIVE mg/dL
KETONES UR: NEGATIVE mg/dL
NITRITE: POSITIVE — AB
PH: 5.5 (ref 5.0–8.0)
Protein, ur: 100 mg/dL — AB
Specific Gravity, Urine: 1.01 (ref 1.005–1.030)

## 2017-08-16 MED ORDER — POTASSIUM CHLORIDE CRYS ER 20 MEQ PO TBCR
40.0000 meq | EXTENDED_RELEASE_TABLET | Freq: Once | ORAL | Status: AC
  Administered 2017-08-16: 40 meq via ORAL
  Filled 2017-08-16: qty 2

## 2017-08-16 MED ORDER — POTASSIUM CHLORIDE 10 MEQ/100ML IV SOLN
10.0000 meq | INTRAVENOUS | Status: AC
  Administered 2017-08-16 (×2): 10 meq via INTRAVENOUS
  Filled 2017-08-16 (×2): qty 100

## 2017-08-16 MED ORDER — MAGNESIUM SULFATE 2 GM/50ML IV SOLN
2.0000 g | Freq: Once | INTRAVENOUS | Status: AC
  Administered 2017-08-16: 2 g via INTRAVENOUS
  Filled 2017-08-16: qty 50

## 2017-08-16 MED ORDER — POTASSIUM CHLORIDE ER 10 MEQ PO TBCR: 10.0000 meq | EXTENDED_RELEASE_TABLET | Freq: Every day | ORAL | 0 refills | Status: DC

## 2017-08-16 MED ORDER — CEPHALEXIN 500 MG PO CAPS: 500.0000 mg | ORAL_CAPSULE | Freq: Four times a day (QID) | ORAL | 0 refills | Status: AC

## 2017-08-16 MED ORDER — SODIUM CHLORIDE 0.9 % IV SOLN
1.0000 g | Freq: Once | INTRAVENOUS | Status: AC
  Administered 2017-08-16: 1 g via INTRAVENOUS
  Filled 2017-08-16: qty 10

## 2017-08-16 MED ORDER — HYDROCODONE-ACETAMINOPHEN 5-325 MG PO TABS
1.0000 | ORAL_TABLET | Freq: Once | ORAL | Status: AC
  Administered 2017-08-16: 1 via ORAL
  Filled 2017-08-16: qty 1

## 2017-08-16 NOTE — ED Notes (Signed)
Discharge to home with a urinary leg bag.  Discharge instructions given to patient and family by Rae Lips, RN.

## 2017-08-16 NOTE — ED Triage Notes (Signed)
Pt c/o pain to back of neck that radiates into head onset last Wednesday. Pt c/o urinary retention, last void was around 3:00 pm yesterday.

## 2017-08-16 NOTE — ED Provider Notes (Signed)
[facility] EMERGENCY DEPARTMENT Provider Note   CSN: [id number] Arrival date & time: 08/16/17  0907     History   Chief Complaint Chief Complaint  Patient presents with  . Urinary Retention  . Neck Pain  . Headache    HPI [name] is a 84 y.o. male.  84yo M w/ PMH including CKD, A fib on Eliquis, CABG, PVD who p/w neck pain and urinary retention.  Neck pain: Patient began having posterior neck soreness 2 weeks ago that radiates up both sides. It has been progressively worsening since it started. He reports associated headache. He has been taking tylenol with moderate relief. No extremity numbness/weakness or problems using hands.   Urinary retention: He began having urinary problems and difficulty voiding 2-3 days ago but symptoms have worsened and he has been unable to void since 3pm yesterday. He had a fever up to 101 two nights ago. He reports significant dysuria. No abdominal or flank/back pain. He states he had problems with urinary retention many years ago but does not follow with a urologist. He is on flomax. He went to an urgent care 2 days ago for these symptoms and was started on ciprofloxacin; today is his 3rd day of antibiotics.   The history is provided by the patient.  Neck Pain   Associated symptoms include headaches.  Headache      Past Medical History:  Diagnosis Date  . A-fib ([place])   . Basal cell carcinoma    skin  . Bigeminal rhythm   . Cataract   . Chronic renal insufficiency, stage 3 (moderate) (HCC) 2018   GFR 30s-40s  . Coronary artery disease    post bypass  . CVD (cardiovascular disease)   . Diabetes mellitus without complication ([place])   . Diverticular disease   . Easy bruising   . 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 Gout   . Hernia   . Hyperlipidemia   . Hypertension   . Iron deficiency anemia 02/04/2016  . Macular degeneration   . Microscopic colitis   . PVD (peripheral vascular disease) [facility])      Patient Active Problem List   Diagnosis Date Noted  . Second degree AV block 07/28/2017  . Mobitz (type) I (Wenckebach's) atrioventricular block 06/21/2017  . Bradycardia 06/21/2017  . Elevated troponin I level   . Chronic diastolic CHF (congestive heart failure) ([place])   . Coronary artery disease of bypass graft of native heart with stable angina pectoris ([place])   . Other hyperlipidemia   . Hyperkalemia 05/06/2017  . CHF (congestive heart failure), NYHA class I, unspecified failure chronicity, unspecified type ([place]) 03/24/2017  . Iron deficiency anemia 02/04/2016  . 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 Vitamin D deficiency 12/27/2015  . Anemia 12/27/2015  . [place] proteinuria 12/27/2015  . Chronic kidney disease (CKD) 12/19/2015  . Macular degeneration   . Gout   . A-fib ([place]) 01/03/2013  . HYPERCHOLESTEROLEMIA  IIA 08/02/2008  . Essential hypertension 08/02/2008  . CAD, ARTERY BYPASS GRAFT 08/02/2008  . BIGEMINY 08/02/2008  . CAROTID ARTERY DISEASE 08/02/2008  . PVD (peripheral vascular disease) ([place]) 08/02/2008  . Prediabetes 08/02/2008    Past Surgical History:  Procedure Laterality Date  . CARDIAC CATHETERIZATION  2006  . CARDIOVERSION N/A 02/22/2013   Procedure: CARDIOVERSION;  Surgeon: [name], MD;  Location: [facility] ENDOSCOPY;  Service: Cardiovascular;  Laterality: N/A;  . CARDIOVERSION N/A 11/15/2014   Procedure: CARDIOVERSION;  Surgeon: [name], MD;  Location: [facility] ENDOSCOPY;  Service: Cardiovascular;  Laterality: N/A;  . CARDIOVERSION N/A 04/01/2017   Procedure: CARDIOVERSION;  Surgeon: Larey Dresser, MD;  Location: [facility] ENDOSCOPY;  Service: Cardiovascular;  Laterality: N/A;  . CAROTID ENDARTERECTOMY  2009/ 1993   left/ right  . CORONARY ARTERY BYPASS GRAFT  1999  . EYE SURGERY     eyelid repair  . INGUINAL HERNIA REPAIR  02/11/2012   Procedure: LAPAROSCOPIC BILATERAL INGUINAL HERNIA REPAIR;  Surgeon: Pedro Earls, MD;  Location: [facility]  ORS;  Service: General;  Laterality: Bilateral;  . PACEMAKER IMPLANT N/A 07/28/2017   Procedure: PACEMAKER IMPLANT;  Surgeon: Thompson Grayer, MD;  Location: [facility] CV LAB;  Service: Cardiovascular;  Laterality: N/A;  . SKIN CANCER EXCISION     right ear x 3        Home Medications    Prior to Admission medications   Medication Sig Start Date End Date Taking? Authorizing Provider  ACCU-CHEK FASTCLIX LANCETS MISC Check fasting blood sugar once daily 05/08/17   Kuneff, Renee A, DO  acetaminophen (TYLENOL) 500 MG tablet Take 500 mg by mouth every 6 (six) hours as needed (for pain.).    [provider]  allopurinol (ZYLOPRIM) 300 MG tablet Take 0.5 tablets (150 mg total) by mouth daily. 03/31/17   Kuneff, Renee A, DO  Aloe Vera 25 MG CAPS Take 25 mg by mouth 2 (two) times daily.     [provider]  amLODipine (NORVASC) 5 MG tablet Take 1 tablet (5 mg total) by mouth daily. 07/08/17   [name], MD  atorvastatin (LIPITOR) 20 MG tablet Take 1 tablet (20 mg total) by mouth at bedtime. 01/15/17   Kuneff, Renee A, DO  carvedilol (COREG) 6.25 MG tablet Take 3.125 mg by mouth 2 (two) times daily with a meal.    [provider]  cephALEXin (KEFLEX) 500 MG capsule Take 1 capsule (500 mg total) by mouth 4 (four) times daily for 7 days. 08/16/17 08/23/17  Chantry Headen, Wenda Overland, MD  cholecalciferol (VITAMIN D) 1000 units tablet Take 1,000 Units by mouth daily.     [provider]  CHROMIUM GTF PO Take 2 capsules by mouth every evening.     [provider]  Cinnamon 500 MG TABS Take 2 tablets by mouth at bedtime.     [provider]  Coenzyme Q10 (CO Q 10) 100 MG CAPS Take 100 mg by mouth daily.     [provider]  dicyclomine (BENTYL) 10 MG capsule Take 1 capsule (10 mg total) by mouth 4 (four) times daily -  before meals and at bedtime. 08/12/17   Kuneff, Renee A, DO  diphenoxylate-atropine (LOMOTIL) 2.5-0.025 MG tablet Take 1 tablet by  mouth 3 (three) times daily as needed for diarrhea or loose stools. 08/12/17   Kuneff, Renee A, DO  ELIQUIS 2.5 MG TABS tablet TAKE 1 TABLET TWICE A DAY 08/12/16   [name], MD  fish oil-omega-3 fatty acids 1000 MG capsule Take 1 g by mouth daily.     [provider]  furosemide (LASIX) 20 MG tablet Take 1 tablet (20 mg total) by mouth as directed. Patient taking differently: Take 20 mg by mouth daily.  06/22/17 09/20/17  Kroeger, Lorelee Cover., PA-C  glucose blood (ACCU-CHEK GUIDE) test strip Check fasting blood sugar once daily 05/08/17   Kuneff, Renee A, DO  Multiple Vitamins-Minerals (PRESERVISION AREDS PO) Take 1 tablet by mouth 2 (two) times daily.  [provider]  potassium chloride (K-DUR) 10 MEQ tablet Take 1 tablet (10 mEq total) by mouth daily. 08/16/17   Meline Russaw, Wenda Overland, MD  Probiotic Product (PROBIOTIC PO) Take 1 tablet by mouth daily.    [provider]  tamsulosin (FLOMAX) 0.4 MG CAPS capsule TAKE 1 CAPSULE BY MOUTH ONCE DAILY AFTER SUPPER 03/23/17   Kuneff, Renee A, DO  VANADYL SULFATE PO Take 1 capsule by mouth every evening.     [provider]  zinc gluconate 50 MG tablet Take 50 mg by mouth daily.     [provider]    Family History Family History  Problem Relation Age of Onset  . Stroke Mother   . Coronary artery disease Father   . Heart disease Father   . Melanoma Sister     Social History Social History   Tobacco Use  . Smoking status: Former Smoker    Last attempt to quit: 12/25/1961    Years since quitting: 55.6  . Smokeless tobacco: Former Systems developer    Types: Chew    Quit date: 02/03/1969  Substance Use Topics  . Alcohol use: No    Alcohol/week: 0.0 oz  . Drug use: No     Allergies   Penicillins   Review of Systems Review of Systems  Musculoskeletal: Positive for neck pain.  Neurological: Positive for headaches.   All other systems reviewed and are negative except that which was mentioned in  HPI   Physical Exam Updated Vital Signs BP (!) 146/58   Pulse 75   Temp (!) 97.5 F (36.4 C) (Oral)   Resp (!) 21   Ht 5\' 8"  (1.727 m)   Wt 67.1 kg (148 lb)   SpO2 96%   BMI 22.50 kg/m   Physical Exam  Constitutional: He is oriented to person, place, and time. He appears well-developed and well-nourished. No distress.  HENT:  Head: Normocephalic and atraumatic.  Moist mucous membranes  Eyes: Pupils are equal, round, and reactive to light. Conjunctivae and EOM are normal.  Neck: Normal range of motion. Neck supple.  No midline cervical spinal tenderness or paraspinal muscle tenderness  Cardiovascular: Normal rate and regular rhythm.  Murmur heard. Pulmonary/Chest: Effort normal and breath sounds normal.  Abdominal: Soft. Bowel sounds are normal. He exhibits no distension. There is no tenderness.  Genitourinary:  Genitourinary Comments: Foley catheter in place draining orange-tinged urine  Musculoskeletal: He exhibits no edema.  Neurological: He is alert and oriented to person, place, and time. He has normal strength. He displays normal reflexes. No cranial nerve deficit or sensory deficit.  Fluent speech, normal finger-to-nose testing, negative pronator drift  Skin: Skin is warm and dry.  Psychiatric: He has a normal mood and affect. Judgment normal.  Nursing note and vitals reviewed.    ED Treatments / Results  Labs (all labs ordered are listed, but only abnormal results are displayed) Labs Reviewed  URINALYSIS, ROUTINE W REFLEX MICROSCOPIC - Abnormal; Notable for the following components:      Result Value   Color, Urine ORANGE (*)    APPearance HAZY (*)    Hgb urine dipstick TRACE (*)    Protein, ur 100 (*)    Nitrite POSITIVE (*)    Leukocytes, UA SMALL (*)    All other components within normal limits  URINALYSIS, MICROSCOPIC (REFLEX) - Abnormal; Notable for the following components:   Bacteria, UA MANY (*)    Squamous Epithelial / LPF 0-5 (*)    All other  components within normal limits  COMPREHENSIVE METABOLIC PANEL - Abnormal; Notable for the following components:   Potassium 2.7 (*)    CO2 20 (*)    Glucose, Bld 142 (*)    BUN 40 (*)    Creatinine, Ser 2.30 (*)    Calcium 8.4 (*)    Total Protein 6.3 (*)    Albumin 2.9 (*)    AST 62 (*)    GFR calc non Af Amer 24 (*)    GFR calc Af Amer 28 (*)    All other components within normal limits  CBC WITH DIFFERENTIAL/PLATELET - Abnormal; Notable for the following components:   WBC 14.3 (*)    RBC 3.45 (*)    Hemoglobin 10.5 (*)    HCT 29.1 (*)    MCHC 36.1 (*)    RDW 15.7 (*)    Neutro Abs 11.8 (*)    Monocytes Absolute 1.4 (*)    All other components within normal limits  URINE CULTURE    EKG EKG Interpretation  Date/Time:  Sunday August 16 2017 11:29:11 EDT Ventricular Rate:  78 PR Interval:    QRS Duration: 156 QT Interval:  423 QTC Calculation: 423 R Axis:   -120 Text Interpretation:  Atrial-paced complexes Nonspecific IVCD with LAD Probable inferior infarct, acute Anterolateral infarct, old similar to previous Confirmed by Theotis Burrow [phone]) on 08/16/2017 1:11:56 PM   Radiology Ct Cervical Spine Wo Contrast  Result Date: 08/16/2017 CLINICAL DATA:  Neck pain radiating into the head since 08/12/2017. No known injury. EXAM: CT CERVICAL SPINE WITHOUT CONTRAST TECHNIQUE: Multidetector CT imaging of the cervical spine was performed without intravenous contrast. Multiplanar CT image reconstructions were also generated. COMPARISON:  None. FINDINGS: Alignment: Maintained.  Straightening of lordosis noted. Skull base and vertebrae: No acute fracture. No primary bone lesion or focal pathologic process. Soft tissues and spinal canal: No prevertebral fluid or swelling. No visible canal hematoma. Disc levels: Scattered loss of disc space height and endplate spurring appear worst at C3-4, C5-6 and C6-7. Uncovertebral spurring at these levels causes foraminal narrowing appearing worst  bilaterally at C3-4 and on the left at C 6 7. Scattered facet arthropathy is also noted. The right C2-3 facets are ankylosed. Upper chest: Clear. Other: Carotid atherosclerosis is noted. IMPRESSION: No acute abnormality. Cervical spondylosis most notable at C3-4, C5-6 and C6-7. Atherosclerosis. Electronically Signed   By: Inge Rise M.D.   On: 08/16/2017 11:09    Procedures Procedures (including critical care time)  Medications Ordered in ED Medications  cefTRIAXone (ROCEPHIN) 1 g in sodium chloride 0.9 % 100 mL IVPB (0 g Intravenous Stopped 08/16/17 1150)  potassium chloride 10 mEq in 100 mL IVPB (0 mEq Intravenous Stopped 08/16/17 1431)  potassium chloride SA (K-DUR,KLOR-CON) CR tablet 40 mEq (40 mEq Oral Given 08/16/17 1136)  magnesium sulfate IVPB 2 g 50 mL (0 g Intravenous Stopped 08/16/17 1308)  HYDROcodone-acetaminophen (NORCO/VICODIN) 5-325 MG per tablet 1 tablet (1 tablet Oral Given 08/16/17 1314)     Initial Impression / Assessment and Plan / ED Course  I have reviewed the triage vital signs and the nursing notes.  Pertinent labs & imaging results that were available during my care of the patient were reviewed by me and considered in my medical decision making (see chart for details).     Patient was well-appearing on exam, reassuring vital signs, normal neurologic exam.  He had had a Foley placed prior to my evaluation for bladder scan which showed acute urinary retention.  On my exam, he stated his urinary symptoms were significantly improved.   UA is c/w infection, added urine culture and gave CTX.  Regarding neck pain, he has no extremity deficits to suggest nerve impingement. Urinary retention has been ongoing problem and he is on flomax for prostate thus I feel urinary retention is unrelated to neck pain issue. No indication for MRI today.   Labs notable for K 2.7, Cr 2.3 which is mildly worse than previous but probably due to acute obstruction. I anticipate this will  improve quickly given that his obstruction has been corrected with foley. Gave IV and oral potassium repletion as well as magnesium. WBC 14.2.   Patient remained well-appearing on reassessment.  I discussed treatment options with him for hypokalemia and infection, including observation admission or discharge with close follow-up.  The patient voiced understanding of risks and benefits and felt comfortable going home.  Provided with antibiotic course and urology follow-up information.  Also provided with potassium supplementation and instructed to see PCP this week for recheck of potassium and creatinine.  I have extensively reviewed return precautions with the patient and his family.  They have all voiced understanding.  Patient discharged in satisfactory condition.  Final Clinical Impressions(s) / ED Diagnoses   Final diagnoses:  Acute urinary retention  Complicated UTI (urinary tract infection)  Acute renal failure superimposed on chronic kidney disease, unspecified CKD stage, unspecified acute renal failure type (HCC)  Hypokalemia  Neck pain    ED Discharge Orders        Ordered    potassium chloride (K-DUR) 10 MEQ tablet  Daily     08/16/17 1506    cephALEXin (KEFLEX) 500 MG capsule  4 times daily     08/16/17 1506       Jimie Kuwahara, Wenda Overland, MD 08/16/17 1629

## 2017-08-16 NOTE — ED Notes (Signed)
Date and time results received: 08/16/17 1119   Test: K Critical Value: 2.7 Name of Provider Notified: Little Orders Received? Or Actions Taken?: no orders given

## 2017-08-17 LAB — URINE CULTURE
Culture: NO GROWTH
SPECIAL REQUESTS: NORMAL

## 2017-08-18 ENCOUNTER — Encounter: Payer: Self-pay | Admitting: Family Medicine

## 2017-08-18 ENCOUNTER — Ambulatory Visit (INDEPENDENT_AMBULATORY_CARE_PROVIDER_SITE_OTHER): Payer: Medicare Other | Admitting: Family Medicine

## 2017-08-18 VITALS — BP 155/72 | HR 64 | Temp 97.8°F | Resp 20 | Ht 67.0 in | Wt 151.0 lb

## 2017-08-18 DIAGNOSIS — D72829 Elevated white blood cell count, unspecified: Secondary | ICD-10-CM

## 2017-08-18 DIAGNOSIS — N390 Urinary tract infection, site not specified: Secondary | ICD-10-CM

## 2017-08-18 DIAGNOSIS — R319 Hematuria, unspecified: Secondary | ICD-10-CM | POA: Diagnosis not present

## 2017-08-18 DIAGNOSIS — R339 Retention of urine, unspecified: Secondary | ICD-10-CM

## 2017-08-18 DIAGNOSIS — E876 Hypokalemia: Secondary | ICD-10-CM | POA: Diagnosis not present

## 2017-08-18 NOTE — Progress Notes (Signed)
[name] , 05/15/1928, 84 y.o., male MRN: [id number] Patient Care Team    Relationship Specialty Notifications Start End  [name], DO PCP - General Family Medicine  12/18/15   [name], MD PCP - Cardiology Cardiology  03/17/17   [name], MD Consulting Physician Ophthalmology  12/18/15   [name], MD Consulting Physician Dermatology  12/18/15   [name], MD Consulting Physician Cardiology  12/18/15   [name], MD Consulting Physician Ophthalmology  12/19/15   [name], MD Consulting Physician Oncology  09/30/16   [name], MD Attending Physician Nephrology  09/30/16   [name], MD Consulting Physician Dermatology  09/30/16     Chief Complaint  Patient presents with  . Urinary Retention    follow up ER visit     Subjective: [name] is a 84 y.o. male presents for an OV with his wife and son today, after admittance the ED for urinary retention on 08/16/2017.  He also complained of neck pain at that time, and he states the neck pain is almost completely resolved.  He is also seen in an urgent care prior to his ED visit and was found to have a urinary tract infection.  No records available at the time of this visit.  He was treated with Cipro in which he took 2-1/2 days before he went to the emergency room.  In the emergency room he was found to have urinary retention holding about 800 cc of urine.  He reported having a fever of 101 after being seen in the urgent care.  He states he has had urinary retention in the past and was seen with a neurologist, but does not follow with one.  He is on Flomax.  In the emergency room he was found to have acute kidney injury with a creatinine of 2.3, his baseline is around 1.75.  Hypokalemia with a potassium of 2.7.  Elevated WBC 14.3, elevated neutrophils 11.8.  Analysis was positive for hazy, orange colored urine with a trace of hemoglobin, small leukocytes, 100 protein and positive nitrites.   Many bacteria, too numerous to count white blood cells.  Urine cx results (08/14/17) received from UC positive leukocytes, nitrites, protein.  Culture positive for Klebsiella aerogenes: Sensitive to cefepime, ceftriaxone, ciprofloxacin, ertapenem, gentamicin, imipenem, levofloxacin, meropenem, nitrofurantoin, tetracycline, tobramycin, trimethoprim/sulfa.  Recent Labs  Lab 08/16/17 1036 08/18/17 1548  HGB 10.5* 11.2*  HCT 29.1* 32.8*  WBC 14.3* 12.7*  PLT 175 224   Recent Labs  Lab 08/16/17 1036 08/18/17 1543  NA 138 141  K 2.7* 3.6  CL 109 110  CO2 20* 23  GLUCOSE 142* 129*  BUN 40* 25  CREATININE 2.30* 1.72*  CALCIUM 8.4* 8.2*    Depression screen [facility] 2/9 09/30/2016 12/18/2015  Decreased Interest 0 0  Down, Depressed, Hopeless 0 0  PHQ - 2 Score 0 0    Allergies  Allergen Reactions  . Penicillins Hives    Has patient had a PCN reaction causing immediate rash, facial/tongue/throat swelling, SOB or lightheadedness with hypotension: No Has patient had a PCN reaction causing severe rash involving mucus membranes or skin necrosis: Yes Has patient had a PCN reaction that required hospitalization: No Has patient had a PCN reaction occurring within the last 10 years: No If all of the above answers are "NO", then may proceed with Cephalosporin use.    Social History   Tobacco Use  . Smoking status: Former Smoker  Last attempt to quit: 12/25/1961    Years since quitting: 55.6  . Smokeless tobacco: Former Systems developer    Types: Chew    Quit date: 02/03/1969  Substance Use Topics  . Alcohol use: No    Alcohol/week: 0.0 oz   Past Medical History:  Diagnosis Date  . A-fib ([place])   . Basal cell carcinoma    skin  . Bigeminal rhythm   . Cataract   . Chronic renal insufficiency, stage 3 (moderate) (HCC) 2018   GFR 30s-40s  . Coronary artery disease    post bypass  . CVD (cardiovascular disease)   . Diabetes mellitus without complication ([place])   . Diverticular disease   . Easy  bruising   . 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 Gout   . Hernia   . Hyperlipidemia   . Hypertension   . Iron deficiency anemia 02/04/2016  . Macular degeneration   . Microscopic colitis   . PVD (peripheral vascular disease) ([place])    Past Surgical History:  Procedure Laterality Date  . CARDIAC CATHETERIZATION  2006  . CARDIOVERSION N/A 02/22/2013   Procedure: CARDIOVERSION;  Surgeon: [name], MD;  Location: [facility] ENDOS[facility];  Service: Cardiovascular;  Laterality: N/A;  . CARDIOVERSION N/A 11/15/2014   Procedure: CARDIOVERSION;  Surgeon: [name], MD;  Location: [facility] ENDOSCOPY;  Service: Cardiovascular;  Laterality: N/A;  . CARDIOVERSION N/A 04/01/2017   Procedure: CARDIOVERSION;  Surgeon: Larey Dresser, MD;  Location: [facility];  Service: Cardiovascular;  Laterality: N/A;  . CAROTID ENDARTERECTOMY  2009/ 1993   left/ right  . CORONARY ARTERY BYPASS GRAFT  1999  . EYE SURGERY     eyelid repair  . INGUINAL HERNIA REPAIR  02/11/2012   Procedure: LAPAROSCOPIC BILATERAL INGUINAL HERNIA REPAIR;  Surgeon: Pedro Earls, MD;  Location: [facility] ORS;  Service: General;  Laterality: Bilateral;  . PACEMAKER IMPLANT N/A 07/28/2017   Procedure: PACEMAKER IMPLANT;  Surgeon: Thompson Grayer, MD;  Location: [facility];  Service: Cardiovascular;  Laterality: N/A;  . SKIN CANCER EXCISION     right ear x 3   Family History  Problem Relation Age of Onset  . Stroke Mother   . Coronary artery disease Father   . Heart disease Father   . Melanoma Sister    Allergies as of 08/18/2017      Reactions   Penicillins Hives   Has patient had a PCN reaction causing immediate rash, facial/tongue/throat swelling, SOB or lightheadedness with hypotension: No Has patient had a PCN reaction causing severe rash involving mucus membranes or skin necrosis: Yes Has patient had a PCN reaction that required hospitalization: No Has patient had a PCN reaction occurring within the last 10 years:  No If all of the above answers are "NO", then may proceed with Cephalosporin use.      Medication List        Accurate as of 08/18/17 11:59 PM. Always use your most recent med list.          ACCU-CHEK FASTCLIX LANCETS Misc Check fasting blood sugar once daily   acetaminophen 500 MG tablet Commonly known as:  TYLENOL Take 500 mg by mouth every 6 (six) hours as needed (for pain.).   allopurinol 300 MG tablet Commonly known as:  ZYLOPRIM Take 0.5 tablets (150 mg total) by mouth daily.   Aloe Vera 25 MG Caps Take 25 mg by mouth 2 (two) times daily.   amLODipine 5 MG tablet Commonly known as:  NORVASC  Take 1 tablet (5 mg total) by mouth daily.   atorvastatin 20 MG tablet Commonly known as:  LIPITOR Take 1 tablet (20 mg total) by mouth at bedtime.   carvedilol 6.25 MG tablet Commonly known as:  COREG Take 3.125 mg by mouth 2 (two) times daily with a meal.   cephALEXin 500 MG capsule Commonly known as:  KEFLEX Take 1 capsule (500 mg total) by mouth 4 (four) times daily for 7 days.   cholecalciferol 1000 units tablet Commonly known as:  VITAMIN D Take 1,000 Units by mouth daily.   CHROMIUM GTF PO Take 2 capsules by mouth every evening.   Cinnamon 500 MG Tabs Take 2 tablets by mouth at bedtime.   Co Q 10 100 MG Caps Take 100 mg by mouth daily.   dicyclomine 10 MG capsule Commonly known as:  BENTYL Take 1 capsule (10 mg total) by mouth 4 (four) times daily -  before meals and at bedtime.   diphenoxylate-atropine 2.5-0.025 MG tablet Commonly known as:  LOMOTIL Take 1 tablet by mouth 3 (three) times daily as needed for diarrhea or loose stools.   ELIQUIS 2.5 MG Tabs tablet Generic drug:  apixaban TAKE 1 TABLET TWICE A DAY   fish oil-omega-3 fatty acids 1000 MG capsule Take 1 g by mouth daily.   furosemide 20 MG tablet Commonly known as:  LASIX Take 1 tablet (20 mg total) by mouth as directed.   glucose blood test strip Commonly known as:  ACCU-CHEK  GUIDE Check fasting blood sugar once daily   potassium chloride 10 MEQ tablet Commonly known as:  K-DUR Take 1 tablet (10 mEq total) by mouth daily.   PRESERVISION AREDS PO Take 1 tablet by mouth 2 (two) times daily.   PROBIOTIC PO Take 1 tablet by mouth daily.   tamsulosin 0.4 MG Caps capsule Commonly known as:  FLOMAX TAKE 1 CAPSULE BY MOUTH ONCE DAILY AFTER SUPPER   VANADYL SULFATE PO Take 1 capsule by mouth every evening.   zinc gluconate 50 MG tablet Take 50 mg by mouth daily.       All past medical history, surgical history, allergies, family history, immunizations andmedications were updated in the EMR today and reviewed under the history and medication portions of their EMR.     ROS: Negative, with the exception of above mentioned in HPI   Objective:  BP (!) 155/72 (BP Location: Right Arm, Patient Position: Sitting, Cuff Size: Normal)   Pulse 64   Temp 97.8 F (36.6 C)   Resp 20   Ht 5\' 7"  (1.702 m)   Wt 151 lb (68.5 kg)   SpO2 97%   BMI 23.65 kg/m  Body mass index is 23.65 kg/m. Gen: Afebrile. No acute distress. Nontoxic in appearance, well developed, well nourished. Very pleasant caucasian male.  HENT: AT. [place].  MMM.  Eyes:Pupils Equal Round Reactive to light, Extraocular movements intact,  Conjunctiva without redness, discharge or icterus. Neck/lymp/endocrine: Supple, no lymphadenopathy CV: RRR 1/6 SM, no edema Chest: CTAB, no wheeze or crackles. Good air movement, normal resp effort.  Abd: Soft.flat . NTND. BS present and normal.  no Masses palpated. No rebound or guarding.  MSK: No CVA tenderness.  Skin: no rashes, purpura or petechiae.  Neuro:  Normal gait. PERLA. EOMi. Alert. Oriented x3  Psych: Normal affect, dress and demeanor. Normal speech. Normal thought content and judgment.   Assessment/Plan: REAKWON BARREN is a 84 y.o. male present for OV for  Urinary retention/UTI/Leukocytosis, unspecified type -  Has occurred with him the past,  however mild increase in bentyl frequency from BID to TID for his IBS flare could have provoked retention as well. Discussed the adverse SE of bentyl with him again today and cautioned on increasing dose. Overall he is feeling better.  - Interoffice communication with Urology today and  appt made for him for this week to have catheter removed and evaluation.  - urcx in ED without growth, however WBC still elevated. Recheck today.  - continue keflex prescribed.  - continue potassium as prescribe by ED for now. Recheck potassium today.  - Basic Metabolic Panel (BMET) - CBC w/Diff - close follow up after uro appt  Hypokalemia - continue current dose of potassium prescribed by ED (13GIT) - Basic Metabolic Panel (BMET) - CBC w/Diff    Reviewed expectations re: course of current medical issues.  Discussed self-management of symptoms.  Outlined signs and symptoms indicating need for more acute intervention.  Patient verbalized understanding and all questions were answered.  Patient received an After-Visit Summary.    Orders Placed This Encounter  Procedures  . Basic Metabolic Panel (BMET)  . CBC w/Diff     Note is dictated utilizing voice recognition software. Although note has been proof read prior to signing, occasional typographical errors still can be missed. If any questions arise, please do not hesitate to call for verification.   electronically signed by:  Howard Pouch, DO  [place]

## 2017-08-18 NOTE — Patient Instructions (Signed)
I will call you with results once we receive them.  Make sure to follow with cardiology about BP.  Urology appt set for catheter management.   Reduce bentyl back to twice a day, it is possible it could have caused some of the retention problems.

## 2017-08-19 ENCOUNTER — Encounter: Payer: Self-pay | Admitting: Family Medicine

## 2017-08-19 ENCOUNTER — Telehealth: Payer: Self-pay | Admitting: Family Medicine

## 2017-08-19 LAB — CBC WITH DIFFERENTIAL/PLATELET
Basophils Absolute: 64 cells/uL (ref 0–200)
Basophils Relative: 0.5 %
EOS ABS: 292 {cells}/uL (ref 15–500)
Eosinophils Relative: 2.3 %
HCT: 32.8 % — ABNORMAL LOW (ref 38.5–50.0)
Hemoglobin: 11.2 g/dL — ABNORMAL LOW (ref 13.2–17.1)
Lymphs Abs: 1943 cells/uL (ref 850–3900)
MCH: 29.2 pg (ref 27.0–33.0)
MCHC: 34.1 g/dL (ref 32.0–36.0)
MCV: 85.6 fL (ref 80.0–100.0)
MONOS PCT: 7.7 %
MPV: 9.8 fL (ref 7.5–12.5)
NEUTROS PCT: 74.2 %
Neutro Abs: 9423 cells/uL — ABNORMAL HIGH (ref 1500–7800)
Platelets: 224 10*3/uL (ref 140–400)
RBC: 3.83 10*6/uL — ABNORMAL LOW (ref 4.20–5.80)
RDW: 15.6 % — AB (ref 11.0–15.0)
TOTAL LYMPHOCYTE: 15.3 %
WBC mixed population: 978 cells/uL — ABNORMAL HIGH (ref 200–950)
WBC: 12.7 10*3/uL — ABNORMAL HIGH (ref 3.8–10.8)

## 2017-08-19 LAB — BASIC METABOLIC PANEL
BUN / CREAT RATIO: 15 (calc) (ref 6–22)
BUN: 25 mg/dL (ref 7–25)
CHLORIDE: 110 mmol/L (ref 98–110)
CO2: 23 mmol/L (ref 20–32)
Calcium: 8.2 mg/dL — ABNORMAL LOW (ref 8.6–10.3)
Creat: 1.72 mg/dL — ABNORMAL HIGH (ref 0.70–1.11)
Glucose, Bld: 129 mg/dL — ABNORMAL HIGH (ref 65–99)
POTASSIUM: 3.6 mmol/L (ref 3.5–5.3)
Sodium: 141 mmol/L (ref 135–146)

## 2017-08-19 NOTE — Telephone Encounter (Signed)
Spoke with patient reviewed lab results and instructions. Patient verbalized understanding. Patient states he is feeling a lot better. He will call after catheter is removed to schedule follow up appt.

## 2017-08-19 NOTE — Telephone Encounter (Signed)
Please inform patient the following information: - his potassium is low normal now. I would suggest he take a whole tab KDUR 20 MEQ then can continue the 10 MEQ. - his kidney function returned to his baseline.   - His labs indicate infection that is improving, but not yet resolved. - Finish the Keflex prescribed by ED - I would also like to see him early next week a day or two after catheter removed. If urine not retested at uro, I will want to check his urine and his potassium again next week at that appt. If he is not feeling markedly improved or worse by tomorrow, I would like to see him before the weekend.

## 2017-08-24 DIAGNOSIS — R338 Other retention of urine: Secondary | ICD-10-CM | POA: Diagnosis not present

## 2017-08-25 DIAGNOSIS — R338 Other retention of urine: Secondary | ICD-10-CM | POA: Diagnosis not present

## 2017-08-25 DIAGNOSIS — R8279 Other abnormal findings on microbiological examination of urine: Secondary | ICD-10-CM | POA: Diagnosis not present

## 2017-08-28 DIAGNOSIS — R338 Other retention of urine: Secondary | ICD-10-CM | POA: Diagnosis not present

## 2017-08-28 DIAGNOSIS — R8279 Other abnormal findings on microbiological examination of urine: Secondary | ICD-10-CM | POA: Diagnosis not present

## 2017-09-11 DIAGNOSIS — R338 Other retention of urine: Secondary | ICD-10-CM | POA: Diagnosis not present

## 2017-09-17 ENCOUNTER — Other Ambulatory Visit: Payer: Self-pay

## 2017-09-17 ENCOUNTER — Encounter: Payer: Self-pay | Admitting: Hematology & Oncology

## 2017-09-17 ENCOUNTER — Inpatient Hospital Stay: Payer: Medicare Other

## 2017-09-17 ENCOUNTER — Inpatient Hospital Stay: Payer: Medicare Other | Attending: Hematology & Oncology | Admitting: Hematology & Oncology

## 2017-09-17 VITALS — BP 153/51 | HR 77 | Temp 98.5°F | Resp 19 | Wt 153.0 lb

## 2017-09-17 DIAGNOSIS — D509 Iron deficiency anemia, unspecified: Secondary | ICD-10-CM

## 2017-09-17 DIAGNOSIS — D631 Anemia in chronic kidney disease: Secondary | ICD-10-CM

## 2017-09-17 DIAGNOSIS — N189 Chronic kidney disease, unspecified: Secondary | ICD-10-CM | POA: Insufficient documentation

## 2017-09-17 DIAGNOSIS — D500 Iron deficiency anemia secondary to blood loss (chronic): Secondary | ICD-10-CM

## 2017-09-17 DIAGNOSIS — D508 Other iron deficiency anemias: Secondary | ICD-10-CM

## 2017-09-17 DIAGNOSIS — R338 Other retention of urine: Secondary | ICD-10-CM | POA: Diagnosis not present

## 2017-09-17 DIAGNOSIS — R8279 Other abnormal findings on microbiological examination of urine: Secondary | ICD-10-CM | POA: Diagnosis not present

## 2017-09-17 LAB — CMP (CANCER CENTER ONLY)
ALBUMIN: 3.3 g/dL — AB (ref 3.5–5.0)
ALT: 15 U/L (ref 0–55)
ANION GAP: 6 (ref 3–11)
AST: 22 U/L (ref 5–34)
Alkaline Phosphatase: 76 U/L (ref 40–150)
BUN: 25 mg/dL (ref 7–26)
CO2: 19 mmol/L — AB (ref 22–29)
Calcium: 8.3 mg/dL — ABNORMAL LOW (ref 8.4–10.4)
Chloride: 115 mmol/L — ABNORMAL HIGH (ref 98–109)
Creatinine: 1.78 mg/dL — ABNORMAL HIGH (ref 0.70–1.30)
GFR, Est AFR Am: 38 mL/min — ABNORMAL LOW (ref >60)
GFR, Estimated: 33 mL/min — ABNORMAL LOW (ref >60)
GLUCOSE: 186 mg/dL — AB (ref 70–140)
POTASSIUM: 3.2 mmol/L — AB (ref 3.5–5.1)
SODIUM: 140 mmol/L (ref 136–145)
TOTAL PROTEIN: 6.9 g/dL (ref 6.4–8.3)
Total Bilirubin: 0.3 mg/dL (ref 0.2–1.2)

## 2017-09-17 LAB — CBC WITH DIFFERENTIAL (CANCER CENTER ONLY)
BASOS PCT: 0 %
Basophils Absolute: 0 10*3/uL (ref 0.0–0.1)
EOS ABS: 0.2 10*3/uL (ref 0.0–0.5)
Eosinophils Relative: 2 %
HCT: 29.4 % — ABNORMAL LOW (ref 38.7–49.9)
Hemoglobin: 10 g/dL — ABNORMAL LOW (ref 13.0–17.1)
Lymphocytes Relative: 21 %
Lymphs Abs: 2 10*3/uL (ref 0.9–3.3)
MCH: 30.7 pg (ref 28.0–33.4)
MCHC: 34 g/dL (ref 32.0–35.9)
MCV: 90.2 fL (ref 82.0–98.0)
MONO ABS: 1.2 10*3/uL — AB (ref 0.1–0.9)
MONOS PCT: 12 %
Neutro Abs: 6.3 10*3/uL (ref 1.5–6.5)
Neutrophils Relative %: 65 %
PLATELETS: 132 10*3/uL — AB (ref 145–400)
RBC: 3.26 MIL/uL — ABNORMAL LOW (ref 4.20–5.70)
RDW: 16.2 % — AB (ref 11.1–15.7)
WBC Count: 9.7 10*3/uL (ref 4.0–10.0)

## 2017-09-17 LAB — RETICULOCYTES
RBC.: 3.3 MIL/uL — ABNORMAL LOW (ref 4.20–5.82)
Retic Count, Absolute: 56.1 10*3/uL (ref 34.8–93.9)
Retic Ct Pct: 1.7 % (ref 0.8–1.8)

## 2017-09-17 MED ORDER — DARBEPOETIN ALFA 300 MCG/0.6ML IJ SOSY
300.0000 ug | PREFILLED_SYRINGE | Freq: Once | INTRAMUSCULAR | Status: AC
  Administered 2017-09-17: 300 ug via SUBCUTANEOUS

## 2017-09-17 NOTE — Progress Notes (Signed)
Hematology and Oncology Follow Up Visit  TRYSTEN BERNARD [id number] 1928-07-06 84 y.o. 09/17/2017   Principle Diagnosis:  Erythropoietin deficiency anemia Iron deficiency anemia  Current Therapy:   IV iron as indicated Aranesp 300 g subcutaneous as needed for hemoglobin less than 11 - last dose given on 03/2017   Interim History:  Mr. Nester is here today for follow-up.  He now has a pacemaker in place.  He had heart block.  This was put in the back in April.  So far, is doing the job.  I see him in church on Sunday.  If there is any problem, I see here about it at church.  He is doing okay with respect to his blood.  His last iron studies back in March showed a ferritin 186 with an iron saturation of 34%.  He is now allowed to play golf yet.  Hopefully, the cardiologist will let him play in June.  ECOG Performance Status: 1 - Symptomatic but completely ambulatory  Medications:  Allergies as of 09/17/2017      Reactions   Penicillins Hives   Has patient had a PCN reaction causing immediate rash, facial/tongue/throat swelling, SOB or lightheadedness with hypotension: No Has patient had a PCN reaction causing severe rash involving mucus membranes or skin necrosis: Yes Has patient had a PCN reaction that required hospitalization: No Has patient had a PCN reaction occurring within the last 10 years: No If all of the above answers are "NO", then may proceed with Cephalosporin use.      Medication List        Accurate as of 09/17/17  2:06 PM. Always use your most recent med list.          ACCU-CHEK FASTCLIX LANCETS Misc Check fasting blood sugar once daily   acetaminophen 500 MG tablet Commonly known as:  TYLENOL Take 500 mg by mouth every 6 (six) hours as needed (for pain.).   allopurinol 300 MG tablet Commonly known as:  ZYLOPRIM Take 0.5 tablets (150 mg total) by mouth daily.   Aloe Vera 25 MG Caps Take 25 mg by mouth 2 (two) times daily.   amLODipine 5 MG  tablet Commonly known as:  NORVASC Take 1 tablet (5 mg total) by mouth daily.   atorvastatin 20 MG tablet Commonly known as:  LIPITOR Take 1 tablet (20 mg total) by mouth at bedtime.   carvedilol 6.25 MG tablet Commonly known as:  COREG Take 3.125 mg by mouth 2 (two) times daily with a meal.   cholecalciferol 1000 units tablet Commonly known as:  VITAMIN D Take 1,000 Units by mouth daily.   CHROMIUM GTF PO Take 2 capsules by mouth every evening.   Cinnamon 500 MG Tabs Take 2 tablets by mouth at bedtime.   Co Q 10 100 MG Caps Take 100 mg by mouth daily.   dicyclomine 10 MG capsule Commonly known as:  BENTYL Take 1 capsule (10 mg total) by mouth 4 (four) times daily -  before meals and at bedtime.   diphenoxylate-atropine 2.5-0.025 MG tablet Commonly known as:  LOMOTIL Take 1 tablet by mouth 3 (three) times daily as needed for diarrhea or loose stools.   ELIQUIS 2.5 MG Tabs tablet Generic drug:  apixaban TAKE 1 TABLET TWICE A DAY   finasteride 5 MG tablet Commonly known as:  PROSCAR Take 5 mg by mouth daily.   fish oil-omega-3 fatty acids 1000 MG capsule Take 1 g by mouth daily.   furosemide 20 MG  tablet Commonly known as:  LASIX Take 1 tablet (20 mg total) by mouth as directed.   glucose blood test strip Commonly known as:  ACCU-CHEK GUIDE Check fasting blood sugar once daily   potassium chloride 10 MEQ tablet Commonly known as:  K-DUR Take 1 tablet (10 mEq total) by mouth daily.   PRESERVISION AREDS PO Take 1 tablet by mouth 2 (two) times daily.   PROBIOTIC PO Take 1 tablet by mouth daily.   tamsulosin 0.4 MG Caps capsule Commonly known as:  FLOMAX TAKE 1 CAPSULE BY MOUTH ONCE DAILY AFTER SUPPER   VANADYL SULFATE PO Take 1 capsule by mouth every evening.   zinc gluconate 50 MG tablet Take 50 mg by mouth daily.       Allergies:  Allergies  Allergen Reactions  . Penicillins Hives    Has patient had a PCN reaction causing immediate rash,  facial/tongue/throat swelling, SOB or lightheadedness with hypotension: No Has patient had a PCN reaction causing severe rash involving mucus membranes or skin necrosis: Yes Has patient had a PCN reaction that required hospitalization: No Has patient had a PCN reaction occurring within the last 10 years: No If all of the above answers are "NO", then may proceed with Cephalosporin use.     Past Medical History, Surgical history, Social history, and Family History were reviewed and updated.  Review of Systems: Review of Systems  Constitutional: Negative.   HENT: Negative.   Eyes: Negative.   Respiratory: Negative.   Cardiovascular: Positive for palpitations.  Gastrointestinal: Negative.   Genitourinary: Negative.   Musculoskeletal: Negative.   Skin: Negative.   Neurological: Negative.   Endo/Heme/Allergies: Negative.   Psychiatric/Behavioral: Negative.      Physical Exam:  weight is 153 lb (69.4 kg). His oral temperature is 98.5 F (36.9 C). His blood pressure is 153/51 (abnormal) and his pulse is 77. His respiration is 19 and oxygen saturation is 100%.   Wt Readings from Last 3 Encounters:  09/17/17 153 lb (69.4 kg)  08/18/17 151 lb (68.5 kg)  08/16/17 148 lb (67.1 kg)    Physical Exam  Constitutional: He is oriented to person, place, and time.  HENT:  Head: Normocephalic and atraumatic.  Mouth/Throat: Oropharynx is clear and moist.  Eyes: Pupils are equal, round, and reactive to light. EOM are normal.  Neck: Normal range of motion.  Cardiovascular: Normal rate, regular rhythm and normal heart sounds.  Pulmonary/Chest: Effort normal and breath sounds normal.  Abdominal: Soft. Bowel sounds are normal.  Musculoskeletal: Normal range of motion. He exhibits no edema, tenderness or deformity.  Lymphadenopathy:    He has no cervical adenopathy.  Neurological: He is alert and oriented to person, place, and time.  Skin: Skin is warm and dry. No rash noted. No erythema.    Psychiatric: He has a normal mood and affect. His behavior is normal. Judgment and thought content normal.  Vitals reviewed.    Lab Results  Component Value Date   WBC 9.7 09/17/2017   HGB 10.0 (L) 09/17/2017   HCT 29.4 (L) 09/17/2017   MCV 90.2 09/17/2017   PLT 132 (L) 09/17/2017   Lab Results  Component Value Date   FERRITIN 186 07/17/2017   IRON 80 07/17/2017   TIBC 238 07/17/2017   UIBC 158 07/17/2017   IRONPCTSAT 34 (L) 07/17/2017   Lab Results  Component Value Date   RETICCTPCT 1.1 07/17/2017   RBC 3.26 (L) 09/17/2017   No results found for: KPAFRELGTCHN, LAMBDASER, KAPLAMBRATIO No results  found for: Kandis Cocking [facility] Lab Results  Component Value Date   TOTALPROTELP 6.0 (L) 12/26/2015   ALBUMINELP 3.4 (L) 12/26/2015   A1GS 0.4 (H) 12/26/2015   A2GS 0.8 12/26/2015   BETS 0.4 12/26/2015   BETA2SER 0.3 12/26/2015   GAMS 0.8 12/26/2015   MSPIKE Not Observed 01/29/2016   SPEI SEE NOTE 12/26/2015     Chemistry      Component Value Date/Time   NA 141 08/18/2017 1543   NA 141 08/11/2017 0924   NA 145 04/09/2017 0905   NA 140 03/28/2016 0853   K 3.6 08/18/2017 1543   K 4.5 04/09/2017 0905   K 4.4 03/28/2016 0853   CL 110 08/18/2017 1543   CL 105 04/09/2017 0905   CO2 23 08/18/2017 1543   CO2 27 04/09/2017 0905   CO2 19 (L) 03/28/2016 0853   BUN 25 08/18/2017 1543   BUN 27 08/11/2017 0924   BUN 33 (H) 04/09/2017 0905   BUN 34.4 (H) 03/28/2016 0853   CREATININE 1.72 (H) 08/18/2017 1543   CREATININE 1.7 (H) 03/28/2016 0853   GLU 113 07/24/2016      Component Value Date/Time   CALCIUM 8.2 (L) 08/18/2017 1543   CALCIUM 9.2 04/09/2017 0905   CALCIUM 9.2 03/28/2016 0853   ALKPHOS 89 08/16/2017 1036   ALKPHOS 69 04/09/2017 0905   ALKPHOS 85 03/28/2016 0853   AST 62 (H) 08/16/2017 1036   AST 26 07/17/2017 1359   AST 23 03/28/2016 0853   ALT 57 08/16/2017 1036   ALT 20 07/17/2017 1359   ALT 27 04/09/2017 0905   ALT 37 03/28/2016 0853   BILITOT  0.7 08/16/2017 1036   BILITOT 0.7 07/17/2017 1359   BILITOT 0.79 03/28/2016 0853      Impression and Plan: Ms. Goga is a very pleasant 84 yo caucasian gentleman with both erythropoietin deficiency and iron deficiency anemia.   We will go ahead and give a dose of Aranesp today.  We will see what his iron studies show.  Hopefully, the pacemaker will keep his heart and rhythm.  I will plan to see him back in another couple months.  Again, if there are any problems, I will know about it in church.     [name], MD 5/23/20192:06 PM

## 2017-09-18 LAB — FERRITIN: Ferritin: 295 ng/mL (ref 22–316)

## 2017-09-18 LAB — IRON AND TIBC
Iron: 29 ug/dL — ABNORMAL LOW (ref 42–163)
SATURATION RATIOS: 13 % — AB (ref 42–163)
TIBC: 225 ug/dL (ref 202–409)
UIBC: 196 ug/dL

## 2017-09-25 ENCOUNTER — Inpatient Hospital Stay: Payer: Medicare Other

## 2017-09-25 ENCOUNTER — Other Ambulatory Visit: Payer: Self-pay

## 2017-09-25 ENCOUNTER — Other Ambulatory Visit: Payer: Self-pay | Admitting: Family

## 2017-09-25 VITALS — BP 134/61 | Temp 97.5°F | Resp 18

## 2017-09-25 DIAGNOSIS — D508 Other iron deficiency anemias: Secondary | ICD-10-CM

## 2017-09-25 DIAGNOSIS — D509 Iron deficiency anemia, unspecified: Secondary | ICD-10-CM | POA: Diagnosis not present

## 2017-09-25 DIAGNOSIS — D631 Anemia in chronic kidney disease: Secondary | ICD-10-CM | POA: Diagnosis not present

## 2017-09-25 DIAGNOSIS — N189 Chronic kidney disease, unspecified: Secondary | ICD-10-CM | POA: Diagnosis not present

## 2017-09-25 MED ORDER — SODIUM CHLORIDE 0.9 % IV SOLN
510.0000 mg | Freq: Once | INTRAVENOUS | Status: AC
  Administered 2017-09-25: 510 mg via INTRAVENOUS
  Filled 2017-09-25: qty 17

## 2017-09-25 MED ORDER — SODIUM CHLORIDE 0.9 % IV SOLN
Freq: Once | INTRAVENOUS | Status: AC
  Administered 2017-09-25: 09:00:00 via INTRAVENOUS

## 2017-09-25 NOTE — Patient Instructions (Signed)

## 2017-09-30 ENCOUNTER — Encounter: Payer: Self-pay | Admitting: *Deleted

## 2017-09-30 ENCOUNTER — Telehealth: Payer: Self-pay | Admitting: Cardiology

## 2017-09-30 NOTE — Telephone Encounter (Signed)
[facility] calling to see if pt needs antibiotics prior to dental work. Call routed to [place].

## 2017-09-30 NOTE — Telephone Encounter (Signed)
Shasta from [place] would like documentation stating that the patient does not need to be premedicated prior to a dental procedure in the future. She confirmed that he is still taking Eliquis.  Dr. Rayann Heman agreeable to no premedication for dental visits.  Fax # [phone]  Will fax documentation shortly.

## 2017-10-01 ENCOUNTER — Other Ambulatory Visit: Payer: Self-pay | Admitting: Cardiovascular Disease

## 2017-10-01 ENCOUNTER — Other Ambulatory Visit: Payer: Self-pay | Admitting: Family Medicine

## 2017-10-01 DIAGNOSIS — I6523 Occlusion and stenosis of bilateral carotid arteries: Secondary | ICD-10-CM

## 2017-10-01 NOTE — Telephone Encounter (Signed)
RF request for Lomotil LOV: 08/12/17 Next ov: 11/03/17 Last written: 08/12/17 #90 w/ 0RF  Please advise. Thanks.

## 2017-10-02 ENCOUNTER — Ambulatory Visit (HOSPITAL_COMMUNITY)
Admission: RE | Admit: 2017-10-02 | Discharge: 2017-10-02 | Disposition: A | Discharge status: Home / Self Care | Payer: Medicare Other | Source: Ambulatory Visit | Attending: Internal Medicine | Admitting: Internal Medicine

## 2017-10-02 DIAGNOSIS — I2510 Atherosclerotic heart disease of native coronary artery without angina pectoris: Secondary | ICD-10-CM | POA: Diagnosis not present

## 2017-10-02 DIAGNOSIS — I10 Essential (primary) hypertension: Secondary | ICD-10-CM | POA: Insufficient documentation

## 2017-10-02 DIAGNOSIS — I6523 Occlusion and stenosis of bilateral carotid arteries: Secondary | ICD-10-CM | POA: Diagnosis not present

## 2017-10-02 DIAGNOSIS — E785 Hyperlipidemia, unspecified: Secondary | ICD-10-CM | POA: Insufficient documentation

## 2017-10-02 DIAGNOSIS — Z87891 Personal history of nicotine dependence: Secondary | ICD-10-CM | POA: Insufficient documentation

## 2017-10-02 MED ORDER — DIPHENOXYLATE-ATROPINE 2.5-0.025 MG PO TABS: 1.0000 | ORAL_TABLET | Freq: Three times a day (TID) | ORAL | 0 refills | Status: DC | PRN

## 2017-10-02 NOTE — Telephone Encounter (Signed)
Rx faxed

## 2017-10-09 DIAGNOSIS — L603 Nail dystrophy: Secondary | ICD-10-CM | POA: Diagnosis not present

## 2017-10-09 DIAGNOSIS — L84 Corns and callosities: Secondary | ICD-10-CM | POA: Diagnosis not present

## 2017-10-09 DIAGNOSIS — I739 Peripheral vascular disease, unspecified: Secondary | ICD-10-CM | POA: Diagnosis not present

## 2017-10-09 DIAGNOSIS — E1151 Type 2 diabetes mellitus with diabetic peripheral angiopathy without gangrene: Secondary | ICD-10-CM | POA: Diagnosis not present

## 2017-10-19 ENCOUNTER — Other Ambulatory Visit: Payer: Self-pay | Admitting: Cardiovascular Disease

## 2017-10-26 ENCOUNTER — Other Ambulatory Visit: Payer: Self-pay | Admitting: Cardiovascular Disease

## 2017-10-26 NOTE — Telephone Encounter (Signed)
Eliquis 2.5mg  refill request received; pt is 84 yrs old, wt- 69.4kg, Crea-1.78 on 09/17/17, last seen by Dr. Rayann Heman on 07/23/17; will send in refill to requested pharmacy.

## 2017-11-02 ENCOUNTER — Ambulatory Visit (INDEPENDENT_AMBULATORY_CARE_PROVIDER_SITE_OTHER): Payer: Medicare Other | Admitting: Internal Medicine

## 2017-11-02 ENCOUNTER — Encounter: Payer: Self-pay | Admitting: Internal Medicine

## 2017-11-02 VITALS — BP 122/60 | HR 70 | Ht 67.0 in | Wt 150.0 lb

## 2017-11-02 DIAGNOSIS — I441 Atrioventricular block, second degree: Secondary | ICD-10-CM | POA: Diagnosis not present

## 2017-11-02 DIAGNOSIS — Z950 Presence of cardiac pacemaker: Secondary | ICD-10-CM | POA: Diagnosis not present

## 2017-11-02 DIAGNOSIS — I119 Hypertensive heart disease without heart failure: Secondary | ICD-10-CM | POA: Diagnosis not present

## 2017-11-02 DIAGNOSIS — I480 Paroxysmal atrial fibrillation: Secondary | ICD-10-CM | POA: Diagnosis not present

## 2017-11-02 DIAGNOSIS — I6523 Occlusion and stenosis of bilateral carotid arteries: Secondary | ICD-10-CM

## 2017-11-02 LAB — CUP PACEART INCLINIC DEVICE CHECK
Date Time Interrogation Session: 20190708134036
Implantable Lead Implant Date: 20190402
Implantable Lead Location: 753859
Implantable Lead Location: 753860
Lead Channel Impedance Value: 562.5 Ohm
Lead Channel Pacing Threshold Amplitude: 0.75 V
Lead Channel Pacing Threshold Pulse Width: 0.5 ms
Lead Channel Sensing Intrinsic Amplitude: 12 mV
Lead Channel Setting Pacing Amplitude: 2 V
Lead Channel Setting Pacing Pulse Width: 0.5 ms
Lead Channel Setting Sensing Sensitivity: 2 mV
MDC IDC LEAD IMPLANT DT: 20190402
MDC IDC MSMT BATTERY REMAINING LONGEVITY: 114 mo
MDC IDC MSMT BATTERY VOLTAGE: 3.01 V
MDC IDC MSMT LEADCHNL RA IMPEDANCE VALUE: 400 Ohm
MDC IDC MSMT LEADCHNL RA PACING THRESHOLD AMPLITUDE: 0.5 V
MDC IDC MSMT LEADCHNL RA PACING THRESHOLD PULSEWIDTH: 0.5 ms
MDC IDC MSMT LEADCHNL RA SENSING INTR AMPL: 2.5 mV
MDC IDC PG IMPLANT DT: 20190402
MDC IDC SET LEADCHNL RV PACING AMPLITUDE: 2.5 V
MDC IDC STAT BRADY RA PERCENT PACED: 8.7 %
MDC IDC STAT BRADY RV PERCENT PACED: 75 %
Pulse Gen Model: 2272
Pulse Gen Serial Number: 9003454

## 2017-11-02 NOTE — Patient Instructions (Addendum)
Medication Instructions:  Your physician recommends that you continue on your current medications as directed. Please refer to the Current Medication list given to you today.  Labwork: None ordered.  Testing/Procedures: None ordered.  Follow-Up: Your physician wants you to follow-up in: one year with Amber Seiler, NP.   You will receive a reminder letter in the mail two months in advance. If you don't receive a letter, please call our office to schedule the follow-up appointment.  Remote monitoring is used to monitor your Pacemaker from home. This monitoring reduces the number of office visits required to check your device to one time per year. It allows us to keep an eye on the functioning of your device to ensure it is working properly. You are scheduled for a device check from home on 02/01/2018. You may send your transmission at any time that day. If you have a wireless device, the transmission will be sent automatically. After your physician reviews your transmission, you will receive a postcard with your next transmission date.  Any Other Special Instructions Will Be Listed Below (If Applicable).  If you need a refill on your cardiac medications before your next appointment, please call your pharmacy.   

## 2017-11-02 NOTE — Progress Notes (Signed)
PCP: [name], DO Primary Cardiologist: Dr Johnsie Cancel Primary EP:  Dr Katherina Mires is a 84 y.o. male who presents today for routine electrophysiology followup.  Since his recent pacemaker implant, the patient reports doing very well.  He feels "much better" since PPM implant.  + improved energy.  Denies procedure related complications.  Now back to playing golf. Today, he denies symptoms of palpitations, chest pain, shortness of breath,  lower extremity edema, dizziness, presyncope, or syncope.  The patient is otherwise without complaint today.   Past Medical History:  Diagnosis Date  . A-fib ([place])   . Basal cell carcinoma    skin  . Bigeminal rhythm   . Cataract   . Chronic renal insufficiency, stage 3 (moderate) (HCC) 2018   GFR 30s-40s  . Coronary artery disease    post bypass  . CVD (cardiovascular disease)   . Diabetes mellitus without complication ([place])   . Diverticular disease   . Easy bruising   . 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 Gout   . Hernia   . Hyperlipidemia   . Hypertension   . Iron deficiency anemia 02/04/2016  . Macular degeneration   . Microscopic colitis   . PVD (peripheral vascular disease) ([place])    Past Surgical History:  Procedure Laterality Date  . CARDIAC CATHETERIZATION  2006  . CARDIOVERSION N/A 02/22/2013   Procedure: CARDIOVERSION;  Surgeon: [name], MD;  Location: [facility] ENDOSCOPY;  Service: Cardiovascular;  Laterality: N/A;  . CARDIOVERSION N/A 11/15/2014   Procedure: CARDIOVERSION;  Surgeon: [name], MD;  Location: [facility] ENDOSCOPY;  Service: Cardiovascular;  Laterality: N/A;  . CARDIOVERSION N/A 04/01/2017   Procedure: CARDIOVERSION;  Surgeon: Larey Dresser, MD;  Location: [place];  Service: Cardiovascular;  Laterality: N/A;  . CAROTID ENDARTERECTOMY  2009/ 1993   left/ right  . CORONARY ARTERY BYPASS GRAFT  1999  . EYE SURGERY     eyelid repair  . INGUINAL HERNIA REPAIR  02/11/2012   Procedure:  LAPAROSCOPIC BILATERAL INGUINAL HERNIA REPAIR;  Surgeon: Pedro Earls, MD;  Location: [facility] ORS;  Service: General;  Laterality: Bilateral;  . PACEMAKER IMPLANT N/A 07/28/2017   St Jude Medical Assurity MRI conditional  dual-chamber pacemaker for symptomatic second degree AV block by Dr Rayann Heman  . SKIN CANCER EXCISION     right ear x 3    ROS- all systems are reviewed and negative except as per HPI above  Current Outpatient Medications  Medication Sig Dispense Refill  . ACCU-CHEK FASTCLIX LANCETS MISC Check fasting blood sugar once daily 100 each 3  . acetaminophen (TYLENOL) 500 MG tablet Take 500 mg by mouth every 6 (six) hours as needed (for pain.).    Marland Kitchen allopurinol (ZYLOPRIM) 300 MG tablet Take 0.5 tablets (150 mg total) by mouth daily. 45 tablet 3  . Aloe Vera 25 MG CAPS Take 25 mg by mouth 2 (two) times daily.     Marland Kitchen amLODipine (NORVASC) 5 MG tablet Take 1 tablet (5 mg total) by mouth daily. 90 tablet 3  . atorvastatin (LIPITOR) 20 MG tablet Take 1 tablet (20 mg total) by mouth at bedtime. 90 tablet 3  . carvedilol (COREG) 6.25 MG tablet TAKE 1 TABLET BY MOUTH TWICE A DAY (Patient taking differently: TAKE 1/2 TABLET BY MOUTH TWICE A DAY) 180 tablet 2  . cholecalciferol (VITAMIN D) 1000 units tablet Take 1,000 Units by mouth daily.     . CHROMIUM GTF PO Take 2  capsules by mouth every evening.     . Cinnamon 500 MG TABS Take 2 tablets by mouth at bedtime.     . Coenzyme Q10 (CO Q 10) 100 MG CAPS Take 100 mg by mouth daily.     Marland Kitchen dicyclomine (BENTYL) 10 MG capsule Take 1 capsule (10 mg total) by mouth 4 (four) times daily -  before meals and at bedtime. 360 capsule 3  . diphenoxylate-atropine (LOMOTIL) 2.5-0.025 MG tablet Take 1 tablet by mouth 3 (three) times daily as needed for diarrhea or loose stools. 90 tablet 0  . ELIQUIS 2.5 MG TABS tablet TAKE 1 TABLET TWICE A DAY 180 tablet 2  . finasteride (PROSCAR) 5 MG tablet Take 5 mg by mouth daily.  3  . fish oil-omega-3 fatty acids 1000 MG  capsule Take 1 g by mouth daily.     . furosemide (LASIX) 20 MG tablet Take 1 tablet (20 mg total) by mouth as directed. (Patient taking differently: Take 20 mg by mouth daily. ) 90 tablet 3  . glucose blood (ACCU-CHEK GUIDE) test strip Check fasting blood sugar once daily 100 each 3  . Multiple Vitamins-Minerals (PRESERVISION AREDS PO) Take 1 tablet by mouth 2 (two) times daily.     . potassium chloride (K-DUR) 10 MEQ tablet Take 1 tablet (10 mEq total) by mouth daily. (Patient taking differently: Take 5 mEq by mouth daily. ) 5 tablet 0  . Probiotic Product (PROBIOTIC PO) Take 1 tablet by mouth daily.    . tamsulosin (FLOMAX) 0.4 MG CAPS capsule TAKE 1 CAPSULE BY MOUTH ONCE DAILY AFTER SUPPER (Patient taking differently: TAKE 2 CAPSULES BY MOUTH ONCE DAILY AFTER SUPPER) 90 capsule 3  . VANADYL SULFATE PO Take 1 capsule by mouth every evening.     . zinc gluconate 50 MG tablet Take 50 mg by mouth daily.      No current facility-administered medications for this visit.     Physical Exam: Vitals:   11/02/17 1005  BP: 122/60  Pulse: 70  Weight: 150 lb (68 kg)  Height: 5\' 7"  (1.702 m)    GEN- The patient is well appearing, alert and oriented x 3 today.   Head- normocephalic, atraumatic Eyes-  Sclera clear, conjunctiva pink Ears- hearing intact Oropharynx- clear Lungs- Clear to ausculation bilaterally, normal work of breathing Chest- pacemaker pocket is well healed Heart- Regular rate and rhythm, no murmurs, rubs or gallops, PMI not laterally displaced GI- soft, NT, ND, + BS Extremities- no clubbing, cyanosis, or edema  Pacemaker interrogation- reviewed in detail today,  See PACEART report  ekg tracing ordered today is personally reviewed and shows AV paced rhythm  Assessment and Plan:  1. Symptomatic second degree heart block Normal pacemaker function See Pace Art report No changes today  2. Hypertensive cardiovascular disease Stable No change required today  3. Afib/  atrial flutter/ atach afib burden by device interrogation is 1.1 % On eliquis for chads2vasc score of 5 Will continue to follow clinically.  4. CAD No ischemic symptoms No changes today  Merlin Return to see EP NP in a year  Thompson Grayer MD, [facility] 11/02/2017 10:47 AM

## 2017-11-03 ENCOUNTER — Encounter: Payer: Self-pay | Admitting: Family Medicine

## 2017-11-03 ENCOUNTER — Ambulatory Visit (INDEPENDENT_AMBULATORY_CARE_PROVIDER_SITE_OTHER): Payer: Medicare Other | Admitting: Family Medicine

## 2017-11-03 VITALS — BP 140/62 | HR 64 | Resp 16 | Ht 68.0 in | Wt 151.0 lb

## 2017-11-03 DIAGNOSIS — K580 Irritable bowel syndrome with diarrhea: Secondary | ICD-10-CM

## 2017-11-03 DIAGNOSIS — N183 Chronic kidney disease, stage 3 unspecified: Secondary | ICD-10-CM

## 2017-11-03 DIAGNOSIS — I5032 Chronic diastolic (congestive) heart failure: Secondary | ICD-10-CM | POA: Diagnosis not present

## 2017-11-03 DIAGNOSIS — I441 Atrioventricular block, second degree: Secondary | ICD-10-CM

## 2017-11-03 DIAGNOSIS — I25708 Atherosclerosis of coronary artery bypass graft(s), unspecified, with other forms of angina pectoris: Secondary | ICD-10-CM

## 2017-11-03 DIAGNOSIS — I739 Peripheral vascular disease, unspecified: Secondary | ICD-10-CM

## 2017-11-03 DIAGNOSIS — I10 Essential (primary) hypertension: Secondary | ICD-10-CM

## 2017-11-03 DIAGNOSIS — E7849 Other hyperlipidemia: Secondary | ICD-10-CM | POA: Diagnosis not present

## 2017-11-03 DIAGNOSIS — D631 Anemia in chronic kidney disease: Secondary | ICD-10-CM | POA: Diagnosis not present

## 2017-11-03 DIAGNOSIS — I480 Paroxysmal atrial fibrillation: Secondary | ICD-10-CM

## 2017-11-03 MED ORDER — DICYCLOMINE HCL 10 MG PO CAPS: 10.0000 mg | ORAL_CAPSULE | Freq: Three times a day (TID) | ORAL | 3 refills | Status: DC

## 2017-11-03 NOTE — Patient Instructions (Signed)
You look great. If you need refills please have your pharmacist send them to [place].  Follow 6 months    Have fun golfing.

## 2017-11-03 NOTE — Progress Notes (Signed)
[name] , 1928/07/18, 84 y.o., male MRN: [id number] Patient Care Team    Relationship Specialty Notifications Start End  [name], DO PCP - General Family Medicine  12/18/15   [name], MD PCP - Cardiology Cardiology  03/17/17   [name], MD Consulting Physician Ophthalmology  12/18/15   [name], MD Consulting Physician Dermatology  12/18/15   [name], MD Consulting Physician Cardiology  12/18/15   [name], MD Consulting Physician Ophthalmology  12/19/15   [name], MD Consulting Physician Oncology  09/30/16   [name], MD Attending Physician Nephrology  09/30/16   [name], MD Consulting Physician Dermatology  09/30/16     Chief Complaint  Patient presents with  . Follow-up    Subjective:  Hypertension/CKD3/A.Fib:  Patient reports compliance Coreg 6.25 (1/2 tab) twice a day and amlodipine 5 mg QD(pt preference). Lasix 20 mg daily PRN- has not needed. Patient denies chest pain, shortness of breath, dizziness or lower extremity edema.  Patient reports compliance with Eliquis 2.5 mg twice a day. He takes fish oil supplementation of 1000 mg daily. Lipitor 20 mg daily. He reports he is feeling good.  BMP: 09/17/2017 GFR 38, creatinine 1.78 (BL), potassium 3.2, he states he has been taking potassium supplement again since those labs. CBC: 09/17/2017 hemoglobin 10, hematocrit 29.4 he is followed by hematology for anemia of chronic disease and iron deficiency anemia.  He reports he had an iron transfusion after those labs. Lipid: 06/21/2017 within normal limits PTH/calcium/vitamin D: 01/15/2017 within normal limits Diet: Low sodium diet followed Exercise: very active RF: CKD3, HLD, CAD w/ CABG (1999), cardiac cath 2011 2/2 CP (all grafts normal), PVD, Afib Significant cardiac history of CAD with prior CABG in 1999 -LIMA to the LAD, SVG to IM, OM1, OM 2, SVG to D1, SVG to PDA/PLA, EF 45-50% with mild diffuse hypokinesis. Grafts  patent by cath 2011 Nuclear stress test 08/2015 scar, no ischemia LVEF 70% also has atrial fibrillation/flutter in 2014 on Eliquis status post [facility] 10/2014, PVD, claudication.  Cardioversion 04/01/2017.  Pacemaker implant 07/28/2017.  Echo 04/03/17 EF 95-63%  Grade 2 diastolic  Mild to moderate MR Moderate LAE mild RAE   Allergies  Allergen Reactions  . Penicillins Hives    Has patient had a PCN reaction causing immediate rash, facial/tongue/throat swelling, SOB or lightheadedness with hypotension: No Has patient had a PCN reaction causing severe rash involving mucus membranes or skin necrosis: Yes Has patient had a PCN reaction that required hospitalization: No Has patient had a PCN reaction occurring within the last 10 years: No If all of the above answers are "NO", then may proceed with Cephalosporin use.    Social History   Tobacco Use  . Smoking status: Former Smoker    Last attempt to quit: 12/25/1961    Years since quitting: 55.8  . Smokeless tobacco: Former Systems developer    Types: Chew    Quit date: 02/03/1969  Substance Use Topics  . Alcohol use: No    Alcohol/week: 0.0 oz   Past Medical History:  Diagnosis Date  . A-fib ([place])   . Basal cell carcinoma    skin  . Bigeminal rhythm   . Cataract   . Chronic renal insufficiency, stage 3 (moderate) (HCC) 2018   GFR 30s-40s  . Coronary artery disease    post bypass  . CVD (cardiovascular disease)   . Diabetes mellitus without complication ([place])   . Diverticular disease   .  Easy bruising   . 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 Gout   . Hernia   . Hyperlipidemia   . Hypertension   . Iron deficiency anemia 02/04/2016  . Macular degeneration   . Microscopic colitis   . PVD (peripheral vascular disease) ([place])    Past Surgical History:  Procedure Laterality Date  . CARDIAC CATHETERIZATION  2006  . CARDIOVERSION N/A 02/22/2013   Procedure: CARDIOVERSION;  Surgeon: [name], MD;  Location: [facility] ENDOSCOPY;  Service:  Cardiovascular;  Laterality: N/A;  . CARDIOVERSION N/A 11/15/2014   Procedure: CARDIOVERSION;  Surgeon: [name], MD;  Location: [facility] ENDOSCOPY;  Service: Cardiovascular;  Laterality: N/A;  . CARDIOVERSION N/A 04/01/2017   Procedure: CARDIOVERSION;  Surgeon: Larey Dresser, MD;  Location: [place];  Service: Cardiovascular;  Laterality: N/A;  . CAROTID ENDARTERECTOMY  2009/ 1993   left/ right  . CORONARY ARTERY BYPASS GRAFT  1999  . EYE SURGERY     eyelid repair  . INGUINAL HERNIA REPAIR  02/11/2012   Procedure: LAPAROSCOPIC BILATERAL INGUINAL HERNIA REPAIR;  Surgeon: Pedro Earls, MD;  Location: [facility] ORS;  Service: General;  Laterality: Bilateral;  . PACEMAKER IMPLANT N/A 07/28/2017   St Jude Medical Assurity MRI conditional  dual-chamber pacemaker for symptomatic second degree AV block by Dr Rayann Heman  . SKIN CANCER EXCISION     right ear x 3   Family History  Problem Relation Age of Onset  . Stroke Mother   . Coronary artery disease Father   . Heart disease Father   . Melanoma Sister    Allergies as of 11/03/2017      Reactions   Penicillins Hives   Has patient had a PCN reaction causing immediate rash, facial/tongue/throat swelling, SOB or lightheadedness with hypotension: No Has patient had a PCN reaction causing severe rash involving mucus membranes or skin necrosis: Yes Has patient had a PCN reaction that required hospitalization: No Has patient had a PCN reaction occurring within the last 10 years: No If all of the above answers are "NO", then may proceed with Cephalosporin use.      Medication List        Accurate as of 11/03/17 10:56 AM. Always use your most recent med list.          ACCU-CHEK FASTCLIX LANCETS Misc Check fasting blood sugar once daily   acetaminophen 500 MG tablet Commonly known as:  TYLENOL Take 500 mg by mouth every 6 (six) hours as needed (for pain.).   allopurinol 300 MG tablet Commonly known as:  ZYLOPRIM Take 0.5 tablets (150 mg  total) by mouth daily.   Aloe Vera 25 MG Caps Take 25 mg by mouth 2 (two) times daily.   amLODipine 5 MG tablet Commonly known as:  NORVASC Take 1 tablet (5 mg total) by mouth daily.   atorvastatin 20 MG tablet Commonly known as:  LIPITOR Take 1 tablet (20 mg total) by mouth at bedtime.   carvedilol 6.25 MG tablet Commonly known as:  COREG TAKE 1 TABLET BY MOUTH TWICE A DAY   cholecalciferol 1000 units tablet Commonly known as:  VITAMIN D Take 1,000 Units by mouth daily.   CHROMIUM GTF PO Take 2 capsules by mouth every evening.   Cinnamon 500 MG Tabs Take 2 tablets by mouth at bedtime.   Co Q 10 100 MG Caps Take 100 mg by mouth daily.   dicyclomine 10 MG capsule Commonly known as:  BENTYL Take 1 capsule (10 mg  total) by mouth 4 (four) times daily -  before meals and at bedtime.   diphenoxylate-atropine 2.5-0.025 MG tablet Commonly known as:  LOMOTIL Take 1 tablet by mouth 3 (three) times daily as needed for diarrhea or loose stools.   ELIQUIS 2.5 MG Tabs tablet Generic drug:  apixaban TAKE 1 TABLET TWICE A DAY   finasteride 5 MG tablet Commonly known as:  PROSCAR Take 5 mg by mouth daily.   fish oil-omega-3 fatty acids 1000 MG capsule Take 1 g by mouth daily.   furosemide 20 MG tablet Commonly known as:  LASIX Take 1 tablet (20 mg total) by mouth as directed.   glucose blood test strip Commonly known as:  ACCU-CHEK GUIDE Check fasting blood sugar once daily   potassium chloride 10 MEQ tablet Commonly known as:  K-DUR Take 1 tablet (10 mEq total) by mouth daily.   PRESERVISION AREDS PO Take 1 tablet by mouth 2 (two) times daily.   PROBIOTIC PO Take 1 tablet by mouth daily.   tamsulosin 0.4 MG Caps capsule Commonly known as:  FLOMAX TAKE 1 CAPSULE BY MOUTH ONCE DAILY AFTER SUPPER   VANADYL SULFATE PO Take 1 capsule by mouth every evening.   zinc gluconate 50 MG tablet Take 50 mg by mouth daily.       No results found for this or any  previous visit (from the past 24 hour(s)). No results found.   ROS: Negative, with the exception of above mentioned in HPI   Objective:  BP 140/62 (BP Location: Left Arm, Patient Position: Sitting, Cuff Size: Normal)   Pulse 64   Resp 16   Ht 5\' 8"  (1.727 m)   Wt 151 lb (68.5 kg)   SpO2 99%   BMI 22.96 kg/m  Body mass index is 22.96 kg/m. Gen: Afebrile. No acute distress.  Nontoxic and presentation.  Very pleasant Caucasian male. HENT: AT. [place]. MMM.  Eyes:Pupils Equal Round Reactive to light, Extraocular movements intact,  Conjunctiva without redness, discharge or icterus. Neck/lymp/endocrine: Supple, no lymphadenopathy, no thyromegaly CV: RRR no murmur, no edema, +2/4 P posterior tibialis pulses Chest: CTAB, no wheeze or crackles Abd: Soft. NTND. BS +.  No masses palpated.  Neuro:  Normal gait. PERLA. EOMi. Alert. Oriented. Psych: Normal affect, dress and demeanor. Normal speech. Normal thought content and judgment.    Assessment/Plan: WITT PLITT is a 84 y.o. male present for OV for  Essential hypertension/Paroxysmal atrial fibrillation (HCC)/Stage 3 chronic kidney disease/HLD//CHF/CAD/PVD -Stable BP.  Continue amlodipine 5 mg daily, Lipitor 20 mg daily, Lasix 20 mg daily as needed, fish oil supplementation, Eliquis 2.5 mg twice daily, Coreg 3.125 twice daily.   - Low-sodium diet.  -  He is very difficult to control and his hydration and IDA/epo injections cause fluctuations leading to both highs and lower than desired readings. Certainly would rather mildly elevated BP over too low.  - Follow-up 6 months, as long as seeing Dr. Johnsie Cancel routinely as well.   Erythropoietin deficiency anemia/iron deficiency anemia -Continue routine follow-ups with hematology.  Irritable bowel syndrome with diarrhea -Stable. - dicyclomine (BENTYL) 10 MG capsule; Take 1 capsule (10 mg total) by mouth 4 (four) times daily -  before meals and at bedtime.  Dispense: 360 capsule; Refill:  3  electronically signed by:  Howard Pouch, DO  [place]

## 2017-11-04 ENCOUNTER — Encounter: Payer: Self-pay | Admitting: Family Medicine

## 2017-11-04 DIAGNOSIS — K580 Irritable bowel syndrome with diarrhea: Secondary | ICD-10-CM | POA: Insufficient documentation

## 2017-11-05 ENCOUNTER — Other Ambulatory Visit: Payer: Self-pay

## 2017-11-06 MED ORDER — DIPHENOXYLATE-ATROPINE 2.5-0.025 MG PO TABS: 1.0000 | ORAL_TABLET | Freq: Three times a day (TID) | ORAL | 3 refills | Status: DC | PRN

## 2017-11-12 ENCOUNTER — Encounter: Payer: Self-pay | Admitting: Hematology & Oncology

## 2017-11-12 ENCOUNTER — Inpatient Hospital Stay: Payer: Medicare Other

## 2017-11-12 ENCOUNTER — Other Ambulatory Visit: Payer: Self-pay

## 2017-11-12 ENCOUNTER — Inpatient Hospital Stay: Payer: Medicare Other | Attending: Hematology & Oncology | Admitting: Hematology & Oncology

## 2017-11-12 VITALS — BP 183/66 | HR 74 | Temp 98.1°F | Resp 18 | Wt 150.0 lb

## 2017-11-12 DIAGNOSIS — D500 Iron deficiency anemia secondary to blood loss (chronic): Secondary | ICD-10-CM

## 2017-11-12 DIAGNOSIS — D631 Anemia in chronic kidney disease: Secondary | ICD-10-CM | POA: Insufficient documentation

## 2017-11-12 DIAGNOSIS — N189 Chronic kidney disease, unspecified: Secondary | ICD-10-CM

## 2017-11-12 DIAGNOSIS — D509 Iron deficiency anemia, unspecified: Secondary | ICD-10-CM | POA: Insufficient documentation

## 2017-11-12 LAB — CMP (CANCER CENTER ONLY)
ALK PHOS: 75 U/L (ref 26–84)
ALT: 27 U/L (ref 10–47)
ANION GAP: 5 (ref 5–15)
AST: 31 U/L (ref 11–38)
Albumin: 3.7 g/dL (ref 3.5–5.0)
BUN: 22 mg/dL (ref 7–22)
CALCIUM: 9.1 mg/dL (ref 8.0–10.3)
CHLORIDE: 110 mmol/L — AB (ref 98–108)
CO2: 26 mmol/L (ref 18–33)
CREATININE: 1.3 mg/dL — AB (ref 0.60–1.20)
Glucose, Bld: 121 mg/dL — ABNORMAL HIGH (ref 73–118)
Potassium: 3.8 mmol/L (ref 3.3–4.7)
Sodium: 141 mmol/L (ref 128–145)
Total Bilirubin: 0.8 mg/dL (ref 0.2–1.6)
Total Protein: 7.2 g/dL (ref 6.4–8.1)

## 2017-11-12 LAB — CBC WITH DIFFERENTIAL (CANCER CENTER ONLY)
Basophils Absolute: 0 10*3/uL (ref 0.0–0.1)
Basophils Relative: 0 %
Eosinophils Absolute: 0.1 10*3/uL (ref 0.0–0.5)
Eosinophils Relative: 2 %
HCT: 36.6 % — ABNORMAL LOW (ref 38.7–49.9)
HEMOGLOBIN: 12.4 g/dL — AB (ref 13.0–17.1)
LYMPHS ABS: 2.4 10*3/uL (ref 0.9–3.3)
LYMPHS PCT: 36 %
MCH: 30.7 pg (ref 28.0–33.4)
MCHC: 33.9 g/dL (ref 32.0–35.9)
MCV: 90.6 fL (ref 82.0–98.0)
MONO ABS: 0.6 10*3/uL (ref 0.1–0.9)
MONOS PCT: 9 %
NEUTROS ABS: 3.7 10*3/uL (ref 1.5–6.5)
NEUTROS PCT: 53 %
Platelet Count: 151 10*3/uL (ref 145–400)
RBC: 4.04 MIL/uL — ABNORMAL LOW (ref 4.20–5.70)
RDW: 14.7 % (ref 11.1–15.7)
WBC Count: 6.8 10*3/uL (ref 4.0–10.0)

## 2017-11-12 LAB — RETICULOCYTES
RBC.: 3.98 MIL/uL — AB (ref 4.20–5.82)
RETIC COUNT ABSOLUTE: 59.7 10*3/uL (ref 34.8–93.9)
RETIC CT PCT: 1.5 % (ref 0.8–1.8)

## 2017-11-12 NOTE — Progress Notes (Signed)
Hematology and Oncology Follow Up Visit  [name] [id number] Nov 15, 1928 84 y.o. 11/12/2017   Principle Diagnosis:  Erythropoietin deficiency anemia Iron deficiency anemia  Current Therapy:   IV iron as indicated Aranesp 300 g subcutaneous as needed for hemoglobin less than 11 - last dose given on  09/17/2016   Interim History:  [name] is here today for follow-up.  He is looking quite good.  I see him in church all the time.  He is part of our choir.  Today is he and his wife's 65th wedding anniversary.  This is a huge day for them.  I am so excited about their wedding anniversary.  He has had no problems.  He feels well.  He has responded well to the Aranesp when we have to give it.  We have told him back in May.  His last iron studies that we did back in May showed a ferritin of 295 with iron saturation of 13%.  He did get a dose of iron back in May.  He has had no bleeding.  There is been no change in bowel or bladder habits.  Overall, his performance status is ECOG 2.  Medications:  Allergies as of 11/12/2017      Reactions   Penicillins Hives   Has patient had a PCN reaction causing immediate rash, facial/tongue/throat swelling, SOB or lightheadedness with hypotension: No Has patient had a PCN reaction causing severe rash involving mucus membranes or skin necrosis: Yes Has patient had a PCN reaction that required hospitalization: No Has patient had a PCN reaction occurring within the last 10 years: No If all of the above answers are "NO", then may proceed with Cephalosporin use.      Medication List        Accurate as of 11/12/17  3:19 PM. Always use your most recent med list.          ACCU-CHEK FASTCLIX LANCETS Misc Check fasting blood sugar once daily   acetaminophen 500 MG tablet Commonly known as:  TYLENOL Take 500 mg by mouth every 6 (six) hours as needed (for pain.).   allopurinol 300 MG tablet Commonly known as:  ZYLOPRIM Take 0.5 tablets  (150 mg total) by mouth daily.   Aloe Vera 25 MG Caps Take 25 mg by mouth 2 (two) times daily.   amLODipine 5 MG tablet Commonly known as:  NORVASC Take 1 tablet (5 mg total) by mouth daily.   atorvastatin 20 MG tablet Commonly known as:  LIPITOR Take 1 tablet (20 mg total) by mouth at bedtime.   carvedilol 6.25 MG tablet Commonly known as:  COREG TAKE 1 TABLET BY MOUTH TWICE A DAY   cholecalciferol 1000 units tablet Commonly known as:  VITAMIN D Take 1,000 Units by mouth daily.   CHROMIUM GTF PO Take 2 capsules by mouth every evening.   Cinnamon 500 MG Tabs Take 2 tablets by mouth at bedtime.   Co Q 10 100 MG Caps Take 100 mg by mouth daily.   dicyclomine 10 MG capsule Commonly known as:  BENTYL Take 1 capsule (10 mg total) by mouth 4 (four) times daily -  before meals and at bedtime.   diphenoxylate-atropine 2.5-0.025 MG tablet Commonly known as:  LOMOTIL Take 1 tablet by mouth 3 (three) times daily as needed for diarrhea or loose stools.   ELIQUIS 2.5 MG Tabs tablet Generic drug:  apixaban TAKE 1 TABLET TWICE A DAY   finasteride 5 MG tablet Commonly known as:  PROSCAR Take 5 mg by mouth daily.   fish oil-omega-3 fatty acids 1000 MG capsule Take 1 g by mouth daily.   furosemide 20 MG tablet Commonly known as:  LASIX Take 1 tablet (20 mg total) by mouth as directed.   glucose blood test strip Commonly known as:  ACCU-CHEK GUIDE Check fasting blood sugar once daily   potassium chloride 10 MEQ tablet Commonly known as:  K-DUR Take 1 tablet (10 mEq total) by mouth daily.   PRESERVISION AREDS PO Take 1 tablet by mouth 2 (two) times daily.   PROBIOTIC PO Take 1 tablet by mouth daily.   tamsulosin 0.4 MG Caps capsule Commonly known as:  FLOMAX TAKE 1 CAPSULE BY MOUTH ONCE DAILY AFTER SUPPER   VANADYL SULFATE PO Take 1 capsule by mouth every evening.   zinc gluconate 50 MG tablet Take 50 mg by mouth daily.       Allergies:  Allergies    Allergen Reactions  . Penicillins Hives    Has patient had a PCN reaction causing immediate rash, facial/tongue/throat swelling, SOB or lightheadedness with hypotension: No Has patient had a PCN reaction causing severe rash involving mucus membranes or skin necrosis: Yes Has patient had a PCN reaction that required hospitalization: No Has patient had a PCN reaction occurring within the last 10 years: No If all of the above answers are "NO", then may proceed with Cephalosporin use.     Past Medical History, Surgical history, Social history, and Family History were reviewed and updated.  Review of Systems: Review of Systems  Constitutional: Negative.   HENT: Negative.   Eyes: Negative.   Respiratory: Negative.   Cardiovascular: Positive for palpitations.  Gastrointestinal: Negative.   Genitourinary: Negative.   Musculoskeletal: Negative.   Skin: Negative.   Neurological: Negative.   Endo/Heme/Allergies: Negative.   Psychiatric/Behavioral: Negative.      Physical Exam:  weight is 150 lb (68 kg). His oral temperature is 98.1 F (36.7 C). His blood pressure is 183/66 (abnormal) and his pulse is 74. His respiration is 18 and oxygen saturation is 100%.   Wt Readings from Last 3 Encounters:  11/12/17 150 lb (68 kg)  11/03/17 151 lb (68.5 kg)  11/02/17 150 lb (68 kg)    Physical Exam  Constitutional: He is oriented to person, place, and time.  HENT:  Head: Normocephalic and atraumatic.  Mouth/Throat: Oropharynx is clear and moist.  Eyes: Pupils are equal, round, and reactive to light. EOM are normal.  Neck: Normal range of motion.  Cardiovascular: Normal rate, regular rhythm and normal heart sounds.  Pulmonary/Chest: Effort normal and breath sounds normal.  Abdominal: Soft. Bowel sounds are normal.  Musculoskeletal: Normal range of motion. He exhibits no edema, tenderness or deformity.  Lymphadenopathy:    He has no cervical adenopathy.  Neurological: He is alert and  oriented to person, place, and time.  Skin: Skin is warm and dry. No rash noted. No erythema.  Psychiatric: He has a normal mood and affect. His behavior is normal. Judgment and thought content normal.  Vitals reviewed.    Lab Results  Component Value Date   WBC 6.8 11/12/2017   HGB 12.4 (L) 11/12/2017   HCT 36.6 (L) 11/12/2017   MCV 90.6 11/12/2017   PLT 151 11/12/2017   Lab Results  Component Value Date   FERRITIN 295 09/17/2017   IRON 29 (L) 09/17/2017   TIBC 225 09/17/2017   UIBC 196 09/17/2017   IRONPCTSAT 13 (L) 09/17/2017   Lab  Results  Component Value Date   RETICCTPCT 1.7 09/17/2017   RBC 4.04 (L) 11/12/2017   No results found for: KPAFRELGTCHN, LAMBDASER, KAPLAMBRATIO No results found for: Kandis Cocking, IGMSERUM Lab Results  Component Value Date   TOTALPROTELP 6.0 (L) 12/26/2015   ALBUMINELP 3.4 (L) 12/26/2015   A1GS 0.4 (H) 12/26/2015   A2GS 0.8 12/26/2015   BETS 0.4 12/26/2015   BETA2SER 0.3 12/26/2015   GAMS 0.8 12/26/2015   MSPIKE Not Observed 01/29/2016   SPEI SEE NOTE 12/26/2015     Chemistry      Component Value Date/Time   NA 141 11/12/2017 1313   NA 141 08/11/2017 0924   NA 145 04/09/2017 0905   NA 140 03/28/2016 0853   K 3.8 11/12/2017 1313   K 4.5 04/09/2017 0905   K 4.4 03/28/2016 0853   CL 110 (H) 11/12/2017 1313   CL 105 04/09/2017 0905   CO2 26 11/12/2017 1313   CO2 27 04/09/2017 0905   CO2 19 (L) 03/28/2016 0853   BUN 22 11/12/2017 1313   BUN 27 08/11/2017 0924   BUN 33 (H) 04/09/2017 0905   BUN 34.4 (H) 03/28/2016 0853   CREATININE 1.30 (H) 11/12/2017 1313   CREATININE 1.72 (H) 08/18/2017 1543   CREATININE 1.7 (H) 03/28/2016 0853   GLU 113 07/24/2016      Component Value Date/Time   CALCIUM 9.1 11/12/2017 1313   CALCIUM 9.2 04/09/2017 0905   CALCIUM 9.2 03/28/2016 0853   ALKPHOS 75 11/12/2017 1313   ALKPHOS 69 04/09/2017 0905   ALKPHOS 85 03/28/2016 0853   AST 31 11/12/2017 1313   AST 23 03/28/2016 0853   ALT 27  11/12/2017 1313   ALT 27 04/09/2017 0905   ALT 37 03/28/2016 0853   BILITOT 0.8 11/12/2017 1313   BILITOT 0.79 03/28/2016 0853      Impression and Plan: Ms. Calixte is a very pleasant 84 yo caucasian gentleman with both erythropoietin deficiency and iron deficiency anemia.   His hemoglobin is fantastic.  He clearly does not need any Aranesp.  I would not think that he needs any iron.  I will see him back in about 10 weeks.  We will try to keep moving his appointments out further if we can.   If there are any problems, he will always let me know when I see him in church.     [name], MD 7/18/20193:19 PM

## 2017-11-13 LAB — IRON AND TIBC
Iron: 89 ug/dL (ref 42–163)
SATURATION RATIOS: 38 % — AB (ref 42–163)
TIBC: 235 ug/dL (ref 202–409)
UIBC: 146 ug/dL

## 2017-11-13 LAB — FERRITIN: FERRITIN: 400 ng/mL — AB (ref 24–336)

## 2017-11-23 DIAGNOSIS — D485 Neoplasm of uncertain behavior of skin: Secondary | ICD-10-CM | POA: Diagnosis not present

## 2017-11-23 DIAGNOSIS — D1801 Hemangioma of skin and subcutaneous tissue: Secondary | ICD-10-CM | POA: Diagnosis not present

## 2017-11-23 DIAGNOSIS — L570 Actinic keratosis: Secondary | ICD-10-CM | POA: Diagnosis not present

## 2017-11-23 DIAGNOSIS — C44622 Squamous cell carcinoma of skin of right upper limb, including shoulder: Secondary | ICD-10-CM | POA: Diagnosis not present

## 2017-11-23 DIAGNOSIS — Z85828 Personal history of other malignant neoplasm of skin: Secondary | ICD-10-CM | POA: Diagnosis not present

## 2017-11-23 DIAGNOSIS — L821 Other seborrheic keratosis: Secondary | ICD-10-CM | POA: Diagnosis not present

## 2017-11-23 DIAGNOSIS — L812 Freckles: Secondary | ICD-10-CM | POA: Diagnosis not present

## 2017-11-24 DIAGNOSIS — N401 Enlarged prostate with lower urinary tract symptoms: Secondary | ICD-10-CM | POA: Diagnosis not present

## 2017-11-24 DIAGNOSIS — R338 Other retention of urine: Secondary | ICD-10-CM | POA: Diagnosis not present

## 2017-12-31 DIAGNOSIS — Z23 Encounter for immunization: Secondary | ICD-10-CM | POA: Diagnosis not present

## 2018-01-04 DIAGNOSIS — I4891 Unspecified atrial fibrillation: Secondary | ICD-10-CM | POA: Diagnosis not present

## 2018-01-04 DIAGNOSIS — I129 Hypertensive chronic kidney disease with stage 1 through stage 4 chronic kidney disease, or unspecified chronic kidney disease: Secondary | ICD-10-CM | POA: Diagnosis not present

## 2018-01-04 DIAGNOSIS — N183 Chronic kidney disease, stage 3 (moderate): Secondary | ICD-10-CM | POA: Diagnosis not present

## 2018-01-08 DIAGNOSIS — L84 Corns and callosities: Secondary | ICD-10-CM | POA: Diagnosis not present

## 2018-01-08 DIAGNOSIS — L603 Nail dystrophy: Secondary | ICD-10-CM | POA: Diagnosis not present

## 2018-01-08 DIAGNOSIS — I739 Peripheral vascular disease, unspecified: Secondary | ICD-10-CM | POA: Diagnosis not present

## 2018-01-08 DIAGNOSIS — E1151 Type 2 diabetes mellitus with diabetic peripheral angiopathy without gangrene: Secondary | ICD-10-CM | POA: Diagnosis not present

## 2018-01-28 ENCOUNTER — Telehealth: Payer: Self-pay | Admitting: Internal Medicine

## 2018-01-28 ENCOUNTER — Telehealth: Payer: Self-pay

## 2018-01-28 NOTE — Telephone Encounter (Signed)
Remote transmission from 01/28/18 @ 1010 reviewed. Presenting rhythm: AsVp @ 101bpm. 1.6% AT/AF burden, max dur. 2hrs (8/16), last 01/28/18 @ 0416 - AT per recent EGMs. No ventricular arrhythmias recorded. Stable lead measurements. Normal device function.

## 2018-01-28 NOTE — Telephone Encounter (Signed)
6.25 bid fine

## 2018-01-28 NOTE — Telephone Encounter (Signed)
Can increase coreg to 12.5 bid and have him f/u with Allred No afib on device check

## 2018-01-28 NOTE — Telephone Encounter (Signed)
New Message  STAT if HR is under 50 or over 120 (normal HR is 60-100 beats per minute)  1) What is your heart rate? 106  2) Do you have a log of your heart rate readings (document readings)? Low 70's hi 60's but now its 99-106  3) Do you have any other symptoms? Pt had a pacemaker put in back in April due to low hr but for the last couple days he has been experiencing a low hr so he called the pacemaker company and they sent the results to his heart doctor and he wants to know if it was received. Please call

## 2018-01-28 NOTE — Telephone Encounter (Signed)
Spoke to patient who sent in a My Chart message concerned about his HR   He had a pacemaker placed 07/2017.  His HR is presently 98-106 bpm.  I asked him if he had other symptoms, which he said "no", only concern is HR.

## 2018-01-28 NOTE — Telephone Encounter (Signed)
Will route to device clinic. Patient is due for a remote check on 10/07.

## 2018-01-28 NOTE — Telephone Encounter (Signed)
Called patient about Dr. Kyla Balzarine recommendations. Per Dr. Johnsie Cancel increase coreg 12.5 mg BID and having him f/u with Dr. Rayann Heman. Patient stated he has been taking coreg 3.125 mg (1/2 tablet) instead of the whole 6.25mg . Informed patient to take the whole tablet for now and see if this helps his HR. Will forward to Dr. Johnsie Cancel, so he is aware.

## 2018-02-01 ENCOUNTER — Ambulatory Visit (INDEPENDENT_AMBULATORY_CARE_PROVIDER_SITE_OTHER): Payer: Medicare Other | Admitting: *Deleted

## 2018-02-01 DIAGNOSIS — I441 Atrioventricular block, second degree: Secondary | ICD-10-CM

## 2018-02-01 NOTE — Progress Notes (Signed)
Remote pacemaker transmission.   

## 2018-02-01 NOTE — Telephone Encounter (Addendum)
Patient will continue to take Carvedilol 6.25 mg BID and f/u with Dr. Rayann Heman on 02/11/18.

## 2018-02-08 NOTE — Telephone Encounter (Signed)
Patient called stated his HR was in the 90's yesterday and this morning was 130/68 with HR 68. Informed patient that this was fine and to give our office a call if he HR starts running below 60. Patient verbalized understanding.

## 2018-02-10 ENCOUNTER — Encounter: Payer: Self-pay | Admitting: Internal Medicine

## 2018-02-10 ENCOUNTER — Ambulatory Visit (INDEPENDENT_AMBULATORY_CARE_PROVIDER_SITE_OTHER): Payer: Medicare Other | Admitting: Internal Medicine

## 2018-02-10 ENCOUNTER — Encounter: Payer: Self-pay | Admitting: Cardiology

## 2018-02-10 VITALS — BP 162/64 | HR 69 | Ht 68.0 in | Wt 155.0 lb

## 2018-02-10 DIAGNOSIS — I480 Paroxysmal atrial fibrillation: Secondary | ICD-10-CM

## 2018-02-10 DIAGNOSIS — I119 Hypertensive heart disease without heart failure: Secondary | ICD-10-CM | POA: Diagnosis not present

## 2018-02-10 DIAGNOSIS — Z950 Presence of cardiac pacemaker: Secondary | ICD-10-CM

## 2018-02-10 DIAGNOSIS — I6523 Occlusion and stenosis of bilateral carotid arteries: Secondary | ICD-10-CM

## 2018-02-10 DIAGNOSIS — I441 Atrioventricular block, second degree: Secondary | ICD-10-CM

## 2018-02-10 NOTE — Patient Instructions (Addendum)
Medication Instructions:  Your physician recommends that you continue on your current medications as directed. Please refer to the Current Medication list given to you today.  Labwork: None ordered.  Testing/Procedures: None ordered.  Follow-Up: Your physician wants you to follow-up in: one year with Chanetta Marshall, NP.   You will receive a reminder letter in the mail two months in advance. If you don't receive a letter, please call our office to schedule the follow-up appointment.  Remote monitoring is used to monitor your Pacemaker from home. This monitoring reduces the number of office visits required to check your device to one time per year. It allows [place] to keep an eye on the functioning of your device to ensure it is working properly. You are scheduled for a device check from home on 05/03/2018. You may send your transmission at any time that day. If you have a wireless device, the transmission will be sent automatically. After your physician reviews your transmission, you will receive a postcard with your next transmission date.  Any Other Special Instructions Will Be Listed Below (If Applicable).  If you need a refill on your cardiac medications before your next appointment, please call your pharmacy.

## 2018-02-10 NOTE — Progress Notes (Signed)
PCP: [name], DO Primary Cardiologist: Dr Johnsie Cancel Primary EP:  Dr Katherina Mires is a 84 y.o. male who presents today for urgentelectrophysiology followup.  He noticed recently that his heart rates were elevated with yard work . He called our office and his coreg was increased to 6.25mg  BID.  He has done well since that time.  Today, he denies symptoms of palpitations, chest pain, shortness of breath,  lower extremity edema, dizziness, presyncope, or syncope.  The patient is otherwise without complaint today.   Past Medical History:  Diagnosis Date  . A-fib ([place])   . Basal cell carcinoma    skin  . Bigeminal rhythm   . Cataract   . Chronic renal insufficiency, stage 3 (moderate) ([facility]) 2018   GFR 30s-40s  . Coronary artery disease    post bypass  . CVD (cardiovascular disease)   . Diabetes mellitus without complication ([place])   . Diverticular disease   . Easy bruising   . 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 Gout   . Hernia   . Hyperlipidemia   . Hypertension   . Iron deficiency anemia 02/04/2016  . Macular degeneration   . Microscopic colitis   . PVD (peripheral vascular disease) ([place])    Past Surgical History:  Procedure Laterality Date  . CARDIAC CATHETERIZATION  2006  . CARDIOVERSION N/A 02/22/2013   Procedure: CARDIOVERSION;  Surgeon: [name], MD;  Location[facility] END[facility]COPY;  Service: Cardiovascular;  Laterality: N/A;  . CARDIOVERSION N/A 11/15/2014   Procedure: CARDIOVERSION;  Surgeon: [name], MD;  Location: [facility] ENDOSCOPY;  Service: Cardiovascular;  Laterality: N/A;  . CARDIOVERSION N/A 04/01/2017   Procedure: CARDIOVERSION;  Surgeon: Larey Dresser, MD;  Location: [place];  Service: Cardiovascular;  Laterality: N/A;  . CAROTID ENDARTERECTOMY  2009/ 1993   left/ right  . CORONARY ARTERY BYPASS GRAFT  1999  . EYE SURGERY     eyelid repair  . INGUINAL HERNIA REPAIR  02/11/2012   Procedure: LAPAROSCOPIC BILATERAL INGUINAL  HERNIA REPAIR;  Surgeon: Pedro Earls, MD;  Location: [facility] ORS;  Service: General;  Laterality: Bilateral;  . PACEMAKER IMPLANT N/A 07/28/2017   [employer] Medical Assurity MRI conditional  dual-chamber pacemaker for symptomatic second degree AV block by Dr Rayann Heman  . SKIN CANCER EXCISION     right ear x 3    ROS- all systems are reviewed and negative except as per HPI above  Current Outpatient Medications  Medication Sig Dispense Refill  . ACCU-CHEK FASTCLIX LANCETS MISC Check fasting blood sugar once daily 100 each 3  . acetaminophen (TYLENOL) 500 MG tablet Take 500 mg by mouth every 6 (six) hours as needed (for pain.).    Marland Kitchen allopurinol (ZYLOPRIM) 300 MG tablet Take 0.5 tablets (150 mg total) by mouth daily. 45 tablet 3  . Aloe Vera 25 MG CAPS Take 25 mg by mouth 2 (two) times daily.     Marland Kitchen amLODipine (NORVASC) 5 MG tablet Take 1 tablet (5 mg total) by mouth daily. 90 tablet 3  . atorvastatin (LIPITOR) 20 MG tablet Take 1 tablet (20 mg total) by mouth at bedtime. 90 tablet 3  . carvedilol (COREG) 6.25 MG tablet TAKE 1 TABLET BY MOUTH TWICE A DAY (Patient taking differently: TAKE 1/2 TABLET BY MOUTH TWICE A DAY) 180 tablet 2  . cholecalciferol (VITAMIN D) 1000 units tablet Take 1,000 Units by mouth daily.     . CHROMIUM GTF PO Take 2 capsules  by mouth every evening.     . Cinnamon 500 MG TABS Take 2 tablets by mouth at bedtime.     . Coenzyme Q10 (CO Q 10) 100 MG CAPS Take 100 mg by mouth daily.     Marland Kitchen dicyclomine (BENTYL) 10 MG capsule Take 1 capsule (10 mg total) by mouth 4 (four) times daily -  before meals and at bedtime. 360 capsule 3  . diphenoxylate-atropine (LOMOTIL) 2.5-0.025 MG tablet Take 1 tablet by mouth 3 (three) times daily as needed for diarrhea or loose stools. 90 tablet 3  . ELIQUIS 2.5 MG TABS tablet TAKE 1 TABLET TWICE A DAY 180 tablet 2  . finasteride (PROSCAR) 5 MG tablet Take 5 mg by mouth daily.  3  . fish oil-omega-3 fatty acids 1000 MG capsule Take 1 g by mouth daily.      . furosemide (LASIX) 20 MG tablet Take 1 tablet (20 mg total) by mouth as directed. 90 tablet 3  . glucose blood (ACCU-CHEK GUIDE) test strip Check fasting blood sugar once daily 100 each 3  . Multiple Vitamins-Minerals (PRESERVISION AREDS PO) Take 1 tablet by mouth 2 (two) times daily.     . potassium chloride (K-DUR) 10 MEQ tablet Take 1 tablet (10 mEq total) by mouth daily. (Patient taking differently: Take 5 mEq by mouth daily. ) 5 tablet 0  . Probiotic Product (PROBIOTIC PO) Take 1 tablet by mouth daily.    . tamsulosin (FLOMAX) 0.4 MG CAPS capsule TAKE 1 CAPSULE BY MOUTH ONCE DAILY AFTER SUPPER (Patient taking differently: TAKE 2 CAPSULES BY MOUTH ONCE DAILY AFTER SUPPER) 90 capsule 3  . VANADYL SULFATE PO Take 1 capsule by mouth every evening.     . zinc gluconate 50 MG tablet Take 50 mg by mouth daily.      No current facility-administered medications for this visit.     Physical Exam: Vitals:   02/10/18 1356  BP: (!) 162/64  Pulse: 69  SpO2: 97%  Weight: 155 lb (70.3 kg)  Height: 5\' 8"  (1.727 m)    GEN- The patient is well appearing, alert and oriented x 3 today.   Head- normocephalic, atraumatic Eyes-  Sclera clear, conjunctiva pink Ears- hearing intact Oropharynx- clear Lungs- Clear to ausculation bilaterally, normal work of breathing Chest- pacemaker pocket is well healed Heart- Regular rate and rhythm, no murmurs, rubs or gallops, PMI not laterally displaced GI- soft, NT, ND, + BS Extremities- no clubbing, cyanosis, or edema  Pacemaker interrogation- reviewed in detail today,  See PACEART report  ekg tracing ordered today is personally reviewed and shows sinus with V pacing  Assessment and Plan:  1. Symptomatic second degree heart block Normal pacemaker function See Pace Art report No changes today Histograms look good today  2. HTN Elevated BP today He reports that his heart rates and BP have recently improved I will therefore not make any changes  today, though should he have additional issues, we should increase coreg to 12.5mg  bid at that time  3. Paroxysmal afib Well controlled (burden 1.4% by device interrogation) chads2vasc score is 5.  He is on eliquis  4. CAD No ischemic symptoms No changes  Merlin Return to see EP NP in a year Follow-up with Dr Johnsie Cancel as scheduled  Thompson Grayer MD, [facility] 02/10/2018 2:20 PM

## 2018-02-11 ENCOUNTER — Encounter: Payer: Self-pay | Admitting: Hematology

## 2018-02-11 ENCOUNTER — Inpatient Hospital Stay: Payer: Medicare Other

## 2018-02-11 ENCOUNTER — Inpatient Hospital Stay: Payer: Medicare Other | Attending: Hematology & Oncology | Admitting: Hematology

## 2018-02-11 VITALS — BP 141/51 | HR 67 | Temp 97.7°F | Resp 18 | Wt 154.4 lb

## 2018-02-11 DIAGNOSIS — N183 Chronic kidney disease, stage 3 (moderate): Secondary | ICD-10-CM | POA: Insufficient documentation

## 2018-02-11 DIAGNOSIS — D631 Anemia in chronic kidney disease: Secondary | ICD-10-CM

## 2018-02-11 DIAGNOSIS — N189 Chronic kidney disease, unspecified: Secondary | ICD-10-CM | POA: Diagnosis not present

## 2018-02-11 DIAGNOSIS — D649 Anemia, unspecified: Secondary | ICD-10-CM

## 2018-02-11 DIAGNOSIS — D508 Other iron deficiency anemias: Secondary | ICD-10-CM

## 2018-02-11 DIAGNOSIS — D500 Iron deficiency anemia secondary to blood loss (chronic): Secondary | ICD-10-CM

## 2018-02-11 DIAGNOSIS — D509 Iron deficiency anemia, unspecified: Secondary | ICD-10-CM | POA: Diagnosis not present

## 2018-02-11 LAB — CMP (CANCER CENTER ONLY)
ALBUMIN: 3.6 g/dL (ref 3.5–5.0)
ALT: 28 U/L (ref 10–47)
AST: 29 U/L (ref 11–38)
Alkaline Phosphatase: 60 U/L (ref 26–84)
Anion gap: 3 — ABNORMAL LOW (ref 5–15)
BUN: 27 mg/dL — ABNORMAL HIGH (ref 7–22)
CALCIUM: 8.9 mg/dL (ref 8.0–10.3)
CO2: 24 mmol/L (ref 18–33)
Chloride: 112 mmol/L — ABNORMAL HIGH (ref 98–108)
Creatinine: 1.6 mg/dL — ABNORMAL HIGH (ref 0.60–1.20)
GLUCOSE: 118 mg/dL (ref 73–118)
Potassium: 4 mmol/L (ref 3.3–4.7)
SODIUM: 139 mmol/L (ref 128–145)
TOTAL PROTEIN: 6.9 g/dL (ref 6.4–8.1)
Total Bilirubin: 0.9 mg/dL (ref 0.2–1.6)

## 2018-02-11 LAB — CBC WITH DIFFERENTIAL (CANCER CENTER ONLY)
ABS IMMATURE GRANULOCYTES: 0.03 10*3/uL (ref 0.00–0.07)
BASOS ABS: 0 10*3/uL (ref 0.0–0.1)
Basophils Relative: 0 %
Eosinophils Absolute: 0.1 10*3/uL (ref 0.0–0.5)
Eosinophils Relative: 1 %
HCT: 31.8 % — ABNORMAL LOW (ref 39.0–52.0)
Hemoglobin: 10.3 g/dL — ABNORMAL LOW (ref 13.0–17.0)
IMMATURE GRANULOCYTES: 0 %
Lymphocytes Relative: 27 %
Lymphs Abs: 2 10*3/uL (ref 0.7–4.0)
MCH: 30.6 pg (ref 26.0–34.0)
MCHC: 32.4 g/dL (ref 30.0–36.0)
MCV: 94.4 fL (ref 80.0–100.0)
MONOS PCT: 8 %
Monocytes Absolute: 0.6 10*3/uL (ref 0.1–1.0)
NEUTROS PCT: 64 %
NRBC: 0 % (ref 0.0–0.2)
Neutro Abs: 4.6 10*3/uL (ref 1.7–7.7)
PLATELETS: 155 10*3/uL (ref 150–400)
RBC: 3.37 MIL/uL — AB (ref 4.22–5.81)
RDW: 14.7 % (ref 11.5–15.5)
WBC Count: 7.4 10*3/uL (ref 4.0–10.5)

## 2018-02-11 LAB — RETICULOCYTES
Immature Retic Fract: 9.7 % (ref 2.3–15.9)
RBC.: 3.37 MIL/uL — ABNORMAL LOW (ref 4.22–5.81)
Retic Count, Absolute: 80.2 10*3/uL (ref 19.0–186.0)
Retic Ct Pct: 2.4 % (ref 0.4–3.1)

## 2018-02-11 MED ORDER — DARBEPOETIN ALFA 300 MCG/0.6ML IJ SOSY
300.0000 ug | PREFILLED_SYRINGE | Freq: Once | INTRAMUSCULAR | Status: AC
  Administered 2018-02-11: 300 ug via SUBCUTANEOUS

## 2018-02-11 MED ORDER — DARBEPOETIN ALFA 300 MCG/0.6ML IJ SOSY
PREFILLED_SYRINGE | INTRAMUSCULAR | Status: AC
  Filled 2018-02-11: qty 0.6

## 2018-02-11 NOTE — Progress Notes (Signed)
[place] OFFICE PROGRESS NOTE  Patient Care Team: [name], DO as PCP - General (Family Medicine) [name], MD as PCP - Cardiology (Cardiology) [name], MD as Consulting Physician (Ophthalmology) [name], MD as Consulting Physician (Dermatology) [name], MD as Consulting Physician (Cardiology) [name], MD as Consulting Physician (Ophthalmology) [name], MD as Consulting Physician (Oncology) [name], MD as Attending Physician (Nephrology) [name], MD as Consulting Physician (Dermatology)  Principle Diagnosis:  Erythropoietin deficiency anemia Iron deficiency anemia  Current Therapy:        IV iron as indicated Aranesp 300 g subcutaneous as needed for hemoglobin less than 11 - last dose given on 09/17/2016  ASSESSMENT & PLAN:  Normocytic anemia -Secondary to epo deficiency and hx of iron deficiency -Last IV iron on 09/25/2017; iron profile adequate in July 2019 -Hgb 10.3 today, down from 12.4 in July 2019 -Patient denies any symptoms of bleeding, such as hematuria, hematochezia or melena -Given his recent episode of tachycardia requiring medication adjustment, we will go ahead and give him an Aranesp injection -I discussed with the patient about the thrombotic risk of ESA and the patient is advised to avoid prolonged immobilization  Recent tachycardia -Patient reports recent tachycardia with heart rate up over 100 -His cardiologist adjusted his medication and his HR has normalized since then -Asymptomatic from cardiac standpoint today -I encouraged the patient to follow up with his cardiologist for any concerning cardiac symptoms  Orders Placed This Encounter  Procedures  . CBC with Differential [facility])    Standing Status:   Standing    Number of Occurrences:   6    Standing Expiration Date:   02/11/2019  . CBC with Differential (Cancer Center Only)    Standing Status:   Future     Standing Expiration Date:   03/18/2019  . CMP ([place] only)    Standing Status:   Future    Standing Expiration Date:   03/18/2019  . Ferritin    Standing Status:   Future    Standing Expiration Date:   03/18/2019  . Iron and TIBC    Standing Status:   Future    Standing Expiration Date:   03/18/2019  . ARANESP TREATMENT CONDITION    Hold Aranesp: Chemotherapy Induced Anemia hold for Hemoglobin greater than 10 / Renal hold for Hemoglobin greater than 11  . SCHEDULING COMMUNICATION INJECTION    Schedule injection appointment 15 min   . [place] COMMUNICATION LAB    Lab appointment 15 min.   All questions were answered. The patient knows to call the clinic with any problems, questions or concerns. No barriers to learning was detected.  A total of more than 15 minutes were spent face-to-face with the patient during this encounter and over half of that time was spent on counseling and coordination of care as outlined above.   Return to clinic in 3 months for labs and follow-up with Dr. Marin Olp.  Tish Men, MD 02/11/2018 1:39 PM  CHIEF COMPLAINT: "I am here for my anemia"  INTERVAL HISTORY: [name] returns to clinic for follow-up of erythropoietin-deficient anemia and history of iron deficiency anemia.  Patient reports that he recently had an episode of palpitation, and his heart rate was found to be elevated up to low 100s.  His cardiologist adjusted his cardiac medication, and since then, his heart rate has been back to normal.  Patient denies any complaint today.  SUMMARY OF ONCOLOGIC  HISTORY:  No history exists.    REVIEW OF SYSTEMS:   Constitutional: ( - ) fevers, ( - )  chills , ( - ) night sweats Eyes: ( - ) blurriness of vision, ( - ) double vision, ( - ) watery eyes Ears, nose, mouth, throat, and face: ( - ) mucositis, ( - ) sore throat Respiratory: ( - ) cough, ( - ) dyspnea, ( - ) wheezes Cardiovascular: ( - ) palpitation, ( - ) chest discomfort, ( - )  lower extremity swelling Gastrointestinal:  ( - ) nausea, ( - ) heartburn, ( - ) change in bowel habits Skin: ( - ) abnormal skin rashes Lymphatics: ( - ) new lymphadenopathy, ( - ) easy bruising Neurological: ( - ) numbness, ( - ) tingling, ( - ) new weaknesses Behavioral/Psych: ( - ) mood change, ( - ) new changes  All other systems were reviewed with the patient and are negative.  I have reviewed the past medical history, past surgical history, social history and family history with the patient and they are unchanged from previous note.  ALLERGIES:  is allergic to penicillins.  MEDICATIONS:  Current Outpatient Medications  Medication Sig Dispense Refill  . ACCU-CHEK FASTCLIX LANCETS MISC Check fasting blood sugar once daily 100 each 3  . acetaminophen (TYLENOL) 500 MG tablet Take 500 mg by mouth every 6 (six) hours as needed (for pain.).    Marland Kitchen allopurinol (ZYLOPRIM) 300 MG tablet Take 0.5 tablets (150 mg total) by mouth daily. 45 tablet 3  . Aloe Vera 25 MG CAPS Take 25 mg by mouth 2 (two) times daily.     Marland Kitchen amLODipine (NORVASC) 5 MG tablet Take 1 tablet (5 mg total) by mouth daily. 90 tablet 3  . atorvastatin (LIPITOR) 20 MG tablet Take 1 tablet (20 mg total) by mouth at bedtime. 90 tablet 3  . carvedilol (COREG) 6.25 MG tablet TAKE 1 TABLET BY MOUTH TWICE A DAY (Patient taking differently: TAKE 1/2 TABLET BY MOUTH TWICE A DAY) 180 tablet 2  . cholecalciferol (VITAMIN D) 1000 units tablet Take 1,000 Units by mouth daily.     . CHROMIUM GTF PO Take 2 capsules by mouth every evening.     . Cinnamon 500 MG TABS Take 2 tablets by mouth at bedtime.     . Coenzyme Q10 (CO Q 10) 100 MG CAPS Take 100 mg by mouth daily.     Marland Kitchen dicyclomine (BENTYL) 10 MG capsule Take 1 capsule (10 mg total) by mouth 4 (four) times daily -  before meals and at bedtime. 360 capsule 3  . diphenoxylate-atropine (LOMOTIL) 2.5-0.025 MG tablet Take 1 tablet by mouth 3 (three) times daily as needed for diarrhea or loose  stools. 90 tablet 3  . ELIQUIS 2.5 MG TABS tablet TAKE 1 TABLET TWICE A DAY 180 tablet 2  . finasteride (PROSCAR) 5 MG tablet Take 5 mg by mouth daily.  3  . fish oil-omega-3 fatty acids 1000 MG capsule Take 1 g by mouth daily.     . furosemide (LASIX) 20 MG tablet Take 1 tablet (20 mg total) by mouth as directed. 90 tablet 3  . glucose blood (ACCU-CHEK GUIDE) test strip Check fasting blood sugar once daily 100 each 3  . Multiple Vitamins-Minerals (PRESERVISION AREDS PO) Take 1 tablet by mouth 2 (two) times daily.     . potassium chloride (K-DUR) 10 MEQ tablet Take 1 tablet (10 mEq total) by mouth daily. (Patient taking differently: Take 5  mEq by mouth daily. ) 5 tablet 0  . Probiotic Product (PROBIOTIC PO) Take 1 tablet by mouth daily.    . tamsulosin (FLOMAX) 0.4 MG CAPS capsule TAKE 1 CAPSULE BY MOUTH ONCE DAILY AFTER SUPPER (Patient taking differently: TAKE 2 CAPSULES BY MOUTH ONCE DAILY AFTER SUPPER) 90 capsule 3  . VANADYL SULFATE PO Take 1 capsule by mouth every evening.     . zinc gluconate 50 MG tablet Take 50 mg by mouth daily.      No current facility-administered medications for this visit.     PHYSICAL EXAMINATION: ECOG PERFORMANCE STATUS: 1 - Symptomatic but completely ambulatory  Today's Vitals   02/11/18 1314 02/11/18 1320  BP: (!) 141/51   Pulse: 67   Resp: 18   Temp: 97.7 F (36.5 C)   TempSrc: Oral   SpO2: 100%   Weight: 154 lb 6.4 oz (70 kg)   PainSc:  0-No pain   Body mass index is 23.48 kg/m.  Filed Weights   02/11/18 1314  Weight: 154 lb 6.4 oz (70 kg)    GENERAL: alert, no distress and comfortable SKIN: skin color, texture, turgor are normal, no rashes or significant lesions EYES: conjunctiva are pink and non-injected, sclera clear OROPHARYNX: no exudate, no erythema; lips, buccal mucosa, and tongue normal  NECK: supple, non-tender LYMPH:  no palpable lymphadenopathy in the cervical or axillary  LUNGS: clear to auscultation and percussion with  normal breathing effort HEART: regular rate & rhythm and no murmurs and no lower extremity edema ABDOMEN: soft, non-tender, non-distended, normal bowel sounds Musculoskeletal: no cyanosis of digits and no clubbing  PSYCH: alert & oriented x 3, fluent speech NEURO: no focal motor/sensory deficits  LABORATORY DATA:  I have reviewed the data as listed    Component Value Date/Time   NA 141 11/12/2017 1313   NA 141 08/11/2017 0924   NA 145 04/09/2017 0905   NA 140 03/28/2016 0853   K 3.8 11/12/2017 1313   K 4.5 04/09/2017 0905   K 4.4 03/28/2016 0853   CL 110 (H) 11/12/2017 1313   CL 105 04/09/2017 0905   CO2 26 11/12/2017 1313   CO2 27 04/09/2017 0905   CO2 19 (L) 03/28/2016 0853   GLUCOSE 121 (H) 11/12/2017 1313   GLUCOSE 184 (H) 04/09/2017 0905   BUN 22 11/12/2017 1313   BUN 27 08/11/2017 0924   BUN 33 (H) 04/09/2017 0905   BUN 34.4 (H) 03/28/2016 0853   CREATININE 1.30 (H) 11/12/2017 1313   CREATININE 1.72 (H) 08/18/2017 1543   CREATININE 1.7 (H) 03/28/2016 0853   CALCIUM 9.1 11/12/2017 1313   CALCIUM 9.2 04/09/2017 0905   CALCIUM 9.2 03/28/2016 0853   PROT 7.2 11/12/2017 1313   PROT 6.8 04/09/2017 0905   PROT 6.9 03/28/2016 0853   ALBUMIN 3.7 11/12/2017 1313   ALBUMIN 3.5 04/09/2017 0905   ALBUMIN 3.3 (L) 03/28/2016 0853   AST 31 11/12/2017 1313   AST 23 03/28/2016 0853   ALT 27 11/12/2017 1313   ALT 27 04/09/2017 0905   ALT 37 03/28/2016 0853   ALKPHOS 75 11/12/2017 1313   ALKPHOS 69 04/09/2017 0905   ALKPHOS 85 03/28/2016 0853   BILITOT 0.8 11/12/2017 1313   BILITOT 0.79 03/28/2016 0853   GFRNONAA 33 (L) 09/17/2017 1308   GFRNONAA 25 (L) 01/15/2017 1031   GFRAA 38 (L) 09/17/2017 1308   GFRAA 29 (L) 01/15/2017 1031    No results found for: SPEP, UPEP  Lab Results  Component  Value Date   WBC 7.4 02/11/2018   NEUTROABS 4.6 02/11/2018   HGB 10.3 (L) 02/11/2018   HCT 31.8 (L) 02/11/2018   MCV 94.4 02/11/2018   PLT 155 02/11/2018      Chemistry       Component Value Date/Time   NA 141 11/12/2017 1313   NA 141 08/11/2017 0924   NA 145 04/09/2017 0905   NA 140 03/28/2016 0853   K 3.8 11/12/2017 1313   K 4.5 04/09/2017 0905   K 4.4 03/28/2016 0853   CL 110 (H) 11/12/2017 1313   CL 105 04/09/2017 0905   CO2 26 11/12/2017 1313   CO2 27 04/09/2017 0905   CO2 19 (L) 03/28/2016 0853   BUN 22 11/12/2017 1313   BUN 27 08/11/2017 0924   BUN 33 (H) 04/09/2017 0905   BUN 34.4 (H) 03/28/2016 0853   CREATININE 1.30 (H) 11/12/2017 1313   CREATININE 1.72 (H) 08/18/2017 1543   CREATININE 1.7 (H) 03/28/2016 0853   GLU 113 07/24/2016      Component Value Date/Time   CALCIUM 9.1 11/12/2017 1313   CALCIUM 9.2 04/09/2017 0905   CALCIUM 9.2 03/28/2016 0853   ALKPHOS 75 11/12/2017 1313   ALKPHOS 69 04/09/2017 0905   ALKPHOS 85 03/28/2016 0853   AST 31 11/12/2017 1313   AST 23 03/28/2016 0853   ALT 27 11/12/2017 1313   ALT 27 04/09/2017 0905   ALT 37 03/28/2016 0853   BILITOT 0.8 11/12/2017 1313   BILITOT 0.79 03/28/2016 2820

## 2018-02-12 LAB — IRON AND TIBC
Iron: 95 ug/dL (ref 42–163)
SATURATION RATIOS: 38 % — AB (ref 42–163)
TIBC: 248 ug/dL (ref 202–409)
UIBC: 153 ug/dL

## 2018-02-12 LAB — FERRITIN: FERRITIN: 528 ng/mL — AB (ref 24–336)

## 2018-03-09 DIAGNOSIS — H35423 Microcystoid degeneration of retina, bilateral: Secondary | ICD-10-CM | POA: Diagnosis not present

## 2018-03-09 DIAGNOSIS — H43813 Vitreous degeneration, bilateral: Secondary | ICD-10-CM | POA: Diagnosis not present

## 2018-03-09 DIAGNOSIS — H353134 Nonexudative age-related macular degeneration, bilateral, advanced atrophic with subfoveal involvement: Secondary | ICD-10-CM | POA: Diagnosis not present

## 2018-03-09 DIAGNOSIS — H35433 Paving stone degeneration of retina, bilateral: Secondary | ICD-10-CM | POA: Diagnosis not present

## 2018-03-12 ENCOUNTER — Other Ambulatory Visit: Payer: Self-pay | Admitting: Family Medicine

## 2018-03-12 DIAGNOSIS — E7800 Pure hypercholesterolemia, unspecified: Secondary | ICD-10-CM

## 2018-03-16 LAB — CUP PACEART REMOTE DEVICE CHECK
Battery Remaining Longevity: 104 mo
Brady Statistic AP VP Percent: 12 %
Brady Statistic AP VS Percent: 1 %
Brady Statistic AS VP Percent: 76 %
Brady Statistic AS VS Percent: 11 %
Brady Statistic RA Percent Paced: 12 %
Implantable Lead Location: 753860
Lead Channel Impedance Value: 400 Ohm
Lead Channel Impedance Value: 540 Ohm
Lead Channel Pacing Threshold Pulse Width: 0.5 ms
Lead Channel Pacing Threshold Pulse Width: 0.5 ms
Lead Channel Sensing Intrinsic Amplitude: 12 mV
Lead Channel Setting Pacing Amplitude: 2 V
Lead Channel Setting Pacing Amplitude: 2.5 V
MDC IDC LEAD IMPLANT DT: 20190402
MDC IDC LEAD IMPLANT DT: 20190402
MDC IDC LEAD LOCATION: 753859
MDC IDC MSMT BATTERY REMAINING PERCENTAGE: 95.5 %
MDC IDC MSMT BATTERY VOLTAGE: 3.01 V
MDC IDC MSMT LEADCHNL RA PACING THRESHOLD AMPLITUDE: 0.5 V
MDC IDC MSMT LEADCHNL RA SENSING INTR AMPL: 1.6 mV
MDC IDC MSMT LEADCHNL RV PACING THRESHOLD AMPLITUDE: 0.75 V
MDC IDC PG IMPLANT DT: 20190402
MDC IDC PG SERIAL: 9003454
MDC IDC SESS DTM: 20191007060023
MDC IDC SET LEADCHNL RV PACING PULSEWIDTH: 0.5 ms
MDC IDC SET LEADCHNL RV SENSING SENSITIVITY: 2 mV
MDC IDC STAT BRADY RV PERCENT PACED: 88 %

## 2018-04-01 ENCOUNTER — Telehealth: Payer: Self-pay | Admitting: *Deleted

## 2018-04-01 DIAGNOSIS — R194 Change in bowel habit: Secondary | ICD-10-CM

## 2018-04-01 NOTE — Telephone Encounter (Signed)
Wife is wanting referral to GI as patient is no better with Diarrhea.   Okay for me to place referral or do you need to see patient again for referral to be placed?  Copied from [place] [id number]. Topic: Referral - Status >> Apr 01, 2018  4:11 PM Carolyn Stare wrote:  Wife call to say pt is having more diarrhea and is askiong if he can be referred to see a GI doctor. She since for the past 4 days everything he eats is going straight through him . Dr Raoul Pitch has given him medicine for this issue but it does not seem to be working

## 2018-04-02 ENCOUNTER — Encounter: Payer: Self-pay | Admitting: Gastroenterology

## 2018-04-02 NOTE — Telephone Encounter (Signed)
Per Diane, she has handled and patient has been scheduled.

## 2018-04-02 NOTE — Addendum Note (Signed)
Addended by: Howard Pouch A on: 04/02/2018 11:10 AM   Modules accepted: Orders

## 2018-04-02 NOTE — Telephone Encounter (Addendum)
Referral to GI placed- please make pt aware

## 2018-04-07 ENCOUNTER — Encounter: Payer: Self-pay | Admitting: Gastroenterology

## 2018-04-09 ENCOUNTER — Encounter: Payer: Self-pay | Admitting: Gastroenterology

## 2018-04-09 ENCOUNTER — Ambulatory Visit (INDEPENDENT_AMBULATORY_CARE_PROVIDER_SITE_OTHER): Payer: Medicare Other | Admitting: Gastroenterology

## 2018-04-09 VITALS — BP 144/60 | HR 74 | Ht 68.0 in | Wt 156.2 lb

## 2018-04-09 DIAGNOSIS — I739 Peripheral vascular disease, unspecified: Secondary | ICD-10-CM | POA: Diagnosis not present

## 2018-04-09 DIAGNOSIS — R197 Diarrhea, unspecified: Secondary | ICD-10-CM | POA: Diagnosis not present

## 2018-04-09 DIAGNOSIS — L603 Nail dystrophy: Secondary | ICD-10-CM | POA: Diagnosis not present

## 2018-04-09 DIAGNOSIS — E1151 Type 2 diabetes mellitus with diabetic peripheral angiopathy without gangrene: Secondary | ICD-10-CM | POA: Diagnosis not present

## 2018-04-09 DIAGNOSIS — L84 Corns and callosities: Secondary | ICD-10-CM | POA: Diagnosis not present

## 2018-04-09 MED ORDER — DIPHENOXYLATE-ATROPINE 2.5-0.025 MG PO TABS: 1.0000 | ORAL_TABLET | Freq: Three times a day (TID) | ORAL | 3 refills | Status: DC | PRN

## 2018-04-09 MED ORDER — BUDESONIDE 3 MG PO CPEP: ORAL_CAPSULE | ORAL | 0 refills | Status: DC

## 2018-04-09 NOTE — Progress Notes (Signed)
Chief Complaint: Diarrhea  Referring Provider:  [name], DO      ASSESSMENT AND PLAN;   #1. Diarrhea d/t microscopic colitis (on Bx from colon 12/2009, neg colon 02/2005). Failed lialda, has been on pred 1mg  QOD previously 2011). No NSAIDs. R/O other causes. R/O assoc celiac.  #2.  Normocytic normochromic anemia with associated erythropoietin deficiency, CRI and iron deficiency anemia on IV iron and Aranesp.  Being followed by hematology Dr. Marin Olp.  #3.  Multiple comorbid conditions including A. fib on Eliquis, CAD s/p CABG, CKD3, HTN, DM2.  Plan: - Check GI pathogen, WBC, giardia antigen fecal elastase, hemooclt cards x 3. - Lomotil 1 tid #60 prn to continue. - Check CBC, CMP, CRP, celiac screen. - Start budesonide 9mg  po qd x 8 weeks, then if better, 6mg  for 2 weeks followed by 3 mg for another 2 weeks. If not better, please call. Then trial of cholestryamine - Avoid NSAIDs. - Stop fish oil - FU in 8 weeks. HPI:    [name] is a 84 y.o. male  Diarrhea x 21yrs, worse latelty 4-5/day Loose watery bowel movements Worse after eating For few days "all his food went through" Has nocturnal symptoms Denies unintentional weight loss No abdominal pain No nausea, vomiting, heartburn, regurgitation, odynophagia or dysphagia.  No significant constipation.  There is no melena or hematochezia.     Past Medical History:  Diagnosis Date  . A-fib ([facility])   . Basal cell carcinoma    skin  . Bigeminal rhythm   . Cataract   . Chronic renal insufficiency, stage 3 (moderate) (HCC) 2018   GFR 30s-40s  . Coronary artery disease    post bypass  . CVD (cardiovascular disease)   . Diabetes mellitus without complication ([place])   . Diverticular disease   . Easy bruising   . 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 Gout   . Hernia   . Hyperlipidemia   . Hypertension   . IBS (irritable bowel syndrome)   . Iron deficiency anemia 02/04/2016  . Macular degeneration     . Microscopic colitis   . PVD (peripheral vascular disease) ([place])     Past Surgical History:  Procedure Laterality Date  . CARDIAC CATHETERIZATION  2006  . CARDIOVERSION N/A 02/22/2013   Procedure: CARDIOVERSION;  Surgeon: [name], MD;  Location: [facility] ENDOSCOPY;  Service: Cardiovascular;  Laterality: N/A;  . CARDIOVERSION N/A 11/15/2014   Procedure: CARDIOVERSION;  Surgeon: [name], MD;  Location: [facility] ENDOSCOPY;  Service: Cardiovascular;  Laterality: N/A;  . CARDIOVERSION N/A 04/01/2017   Procedure: CARDIOVERSION;  Surgeon: Larey Dresser, MD;  Location: [facility];  Service: Cardiovascular;  Laterality: N/A;  . CAROTID ENDARTERECTOMY  2009/ 1993   left/ right  . COLONOSCOPY  03/17/2005   The colon is normal.  . CORONARY ARTERY BYPASS GRAFT  1999  . ESOPHAGOGASTRODUODENOSCOPY  03/17/2005   Normal esophagus. Normal Stomanch. Normal duodenum.   Marland Kitchen EYE SURGERY     eyelid repair  . INGUINAL HERNIA REPAIR  02/11/2012   Procedure: LAPAROSCOPIC BILATERAL INGUINAL HERNIA REPAIR;  Surgeon: Pedro Earls, MD;  Location: [facility] ORS;  Service: General;  Laterality: Bilateral;  . PACEMAKER IMPLANT N/A 07/28/2017   St Jude Medical Assurity MRI conditional  dual-chamber pacemaker for symptomatic second degree AV block by Dr Rayann Heman  . SKIN CANCER EXCISION     right ear x 3    Family History  Problem Relation Age of Onset  .  Stroke Mother   . Coronary artery disease Father   . Heart disease Father   . Melanoma Sister   . Colon cancer Neg Hx     Social History   Tobacco Use  . Smoking status: Former Smoker    Last attempt to quit: 12/25/1961    Years since quitting: 56.3  . Smokeless tobacco: Former Systems developer    Types: Chew    Quit date: 02/03/1969  Substance Use Topics  . Alcohol use: No    Alcohol/week: 0.0 standard drinks  . Drug use: No    Current Outpatient Medications  Medication Sig Dispense Refill  . ACCU-CHEK FASTCLIX LANCETS MISC Check fasting blood sugar once  daily 100 each 3  . acetaminophen (TYLENOL) 500 MG tablet Take 500 mg by mouth every 6 (six) hours as needed (for pain.).    Marland Kitchen allopurinol (ZYLOPRIM) 300 MG tablet Take 0.5 tablets (150 mg total) by mouth daily. 45 tablet 3  . Aloe Vera 25 MG CAPS Take 25 mg by mouth 2 (two) times daily.     Marland Kitchen amLODipine (NORVASC) 5 MG tablet Take 1 tablet (5 mg total) by mouth daily. 90 tablet 3  . atorvastatin (LIPITOR) 20 MG tablet TAKE 1 TABLET AT BEDTIME 90 tablet 0  . carvedilol (COREG) 6.25 MG tablet TAKE 1 TABLET BY MOUTH TWICE A DAY (Patient taking differently: TAKE 1/2 TABLET BY MOUTH TWICE A DAY) 180 tablet 2  . cholecalciferol (VITAMIN D) 1000 units tablet Take 1,000 Units by mouth daily.     . CHROMIUM GTF PO Take 2 capsules by mouth every evening.     . Cinnamon 500 MG TABS Take 2 tablets by mouth at bedtime.     . Coenzyme Q10 (CO Q 10) 100 MG CAPS Take 100 mg by mouth daily.     Marland Kitchen dicyclomine (BENTYL) 10 MG capsule Take 1 capsule (10 mg total) by mouth 4 (four) times daily -  before meals and at bedtime. 360 capsule 3  . diphenoxylate-atropine (LOMOTIL) 2.5-0.025 MG tablet Take 1 tablet by mouth 3 (three) times daily as needed for diarrhea or loose stools. 90 tablet 3  . ELIQUIS 2.5 MG TABS tablet TAKE 1 TABLET TWICE A DAY 180 tablet 2  . finasteride (PROSCAR) 5 MG tablet Take 5 mg by mouth daily.  3  . fish oil-omega-3 fatty acids 1000 MG capsule Take 1 g by mouth daily.     . furosemide (LASIX) 20 MG tablet Take 1 tablet (20 mg total) by mouth as directed. 90 tablet 3  . glucose blood (ACCU-CHEK GUIDE) test strip Check fasting blood sugar once daily 100 each 3  . Multiple Vitamins-Minerals (PRESERVISION AREDS PO) Take 1 tablet by mouth 2 (two) times daily.     . potassium chloride (K-DUR) 10 MEQ tablet Take 1 tablet (10 mEq total) by mouth daily. (Patient taking differently: Take 5 mEq by mouth daily. ) 5 tablet 0  . Probiotic Product (PROBIOTIC PO) Take 1 tablet by mouth daily.    . tamsulosin  (FLOMAX) 0.4 MG CAPS capsule TAKE 1 CAPSULE BY MOUTH ONCE DAILY AFTER SUPPER (Patient taking differently: TAKE 2 CAPSULES BY MOUTH ONCE DAILY AFTER SUPPER) 90 capsule 3  . VANADYL SULFATE PO Take 1 capsule by mouth every evening.     . zinc gluconate 50 MG tablet Take 50 mg by mouth daily.      No current facility-administered medications for this visit.     Allergies  Allergen Reactions  .  Penicillins Hives    Has patient had a PCN reaction causing immediate rash, facial/tongue/throat swelling, SOB or lightheadedness with hypotension: No Has patient had a PCN reaction causing severe rash involving mucus membranes or skin necrosis: Yes Has patient had a PCN reaction that required hospitalization: No Has patient had a PCN reaction occurring within the last 10 years: No If all of the above answers are "NO", then may proceed with Cephalosporin use.     Review of Systems:  Constitutional: Denies fever, chills, diaphoresis, appetite change and fatigue.  HEENT: Denies photophobia, eye pain, redness, hearing loss, ear pain, congestion, sore throat, rhinorrhea, sneezing, mouth sores, neck pain, neck stiffness and tinnitus.   Respiratory: Denies SOB, DOE, cough, chest tightness,  and wheezing.   Cardiovascular: Denies chest pain, palpitations and leg swelling.  Genitourinary: Denies dysuria, urgency, frequency, hematuria, flank pain and difficulty urinating.  Musculoskeletal: Denies myalgias, back pain, joint swelling, arthralgias and gait problem.  Skin: No rash.  Neurological: Denies dizziness, seizures, syncope, weakness, light-headedness, numbness and headaches.  Hematological: Denies adenopathy. Easy bruising, personal or family bleeding history  Psychiatric/Behavioral: No anxiety or depression     Physical Exam:    BP (!) 144/60   Pulse 74   Ht 5\' 8"  (1.727 m)   Wt 156 lb 4 oz (70.9 kg)   BMI 23.76 kg/m  Filed Weights   04/09/18 1530  Weight: 156 lb 4 oz (70.9 kg)    Constitutional:  Well-developed, in no acute distress. Psychiatric: Normal mood and affect. Behavior is normal. HEENT: Pupils normal.  Conjunctivae are normal. No scleral icterus. Neck supple.  Cardiovascular: Normal rate, regular rhythm. No edema Pulmonary/chest: Effort normal and breath sounds normal. No wheezing, rales or rhonchi. Abdominal: Soft, nondistended. Nontender. Bowel sounds active throughout. There are no masses palpable. No hepatomegaly. Rectal:  defered Neurological: Alert and oriented to person place and time. Skin: Skin is warm and dry. No rashes noted.  Data Reviewed: I have personally reviewed following labs and imaging studies  CBC: CBC Latest Ref Rng & Units 02/11/2018 11/12/2017 09/17/2017  WBC 4.0 - 10.5 K/uL 7.4 6.8 9.7  Hemoglobin 13.0 - 17.0 g/dL 10.3(L) 12.4(L) 10.0(L)  Hematocrit 39.0 - 52.0 % 31.8(L) 36.6(L) 29.4(L)  Platelets 150 - 400 K/uL 155 151 132(L)    CMP: CMP Latest Ref Rng & Units 02/11/2018 11/12/2017 09/17/2017  Glucose 73 - 118 mg/dL 118 121(H) 186(H)  BUN 7 - 22 mg/dL 27(H) 22 25  Creatinine 0.60 - 1.20 mg/dL 1.60(H) 1.30(H) 1.78(H)  Sodium 128 - 145 mmol/L 139 141 140  Potassium 3.3 - 4.7 mmol/L 4.0 3.8 3.2(L)  Chloride 98 - 108 mmol/L 112(H) 110(H) 115(H)  CO2 18 - 33 mmol/L 24 26 19(L)  Calcium 8.0 - 10.3 mg/dL 8.9 9.1 8.3(L)  Total Protein 6.4 - 8.1 g/dL 6.9 7.2 6.9  Total Bilirubin 0.2 - 1.6 mg/dL 0.9 0.8 0.3  Alkaline Phos 26 - 84 U/L 60 75 76  AST 11 - 38 U/L 29 31 22   ALT 10 - 47 U/L 28 27 15      William Austria, MD 04/09/2018, 3:49 PM  Cc: Howard Pouch A, DO

## 2018-04-09 NOTE — Patient Instructions (Addendum)
If you are age 82 or older, your body mass index should be between 23-30. Your Body mass index is 23.76 kg/m. If this is out of the aforementioned range listed, please consider follow up with your Primary Care Provider.  If you are age 80 or younger, your body mass index should be between 19-25. Your Body mass index is 23.76 kg/m. If this is out of the aformentioned range listed, please consider follow up with your Primary Care Provider.   We have sent the following medications to your pharmacy for you to pick up at your convenience: Lomotil  Bedesonide  Please go to the lab on the 2nd floor [address] for your scheduled appointment.   Stop Fish oil.  Thank you,  Dr. Jackquline Denmark

## 2018-04-12 ENCOUNTER — Other Ambulatory Visit (INDEPENDENT_AMBULATORY_CARE_PROVIDER_SITE_OTHER): Payer: Medicare Other

## 2018-04-12 ENCOUNTER — Other Ambulatory Visit: Payer: Self-pay | Admitting: Internal Medicine

## 2018-04-12 DIAGNOSIS — R197 Diarrhea, unspecified: Secondary | ICD-10-CM

## 2018-04-12 DIAGNOSIS — E876 Hypokalemia: Secondary | ICD-10-CM

## 2018-04-13 ENCOUNTER — Other Ambulatory Visit: Payer: Medicare Other

## 2018-04-13 LAB — CBC WITH DIFFERENTIAL/PLATELET
Basophils Absolute: 0 10*3/uL (ref 0.0–0.1)
Basophils Relative: 0.4 % (ref 0.0–3.0)
Eosinophils Absolute: 0 10*3/uL (ref 0.0–0.7)
Eosinophils Relative: 0.4 % (ref 0.0–5.0)
HCT: 34.1 % — ABNORMAL LOW (ref 39.0–52.0)
Hemoglobin: 11.6 g/dL — ABNORMAL LOW (ref 13.0–17.0)
LYMPHS ABS: 1.2 10*3/uL (ref 0.7–4.0)
Lymphocytes Relative: 19.9 % (ref 12.0–46.0)
MCHC: 33.9 g/dL (ref 30.0–36.0)
MCV: 91.5 fl (ref 78.0–100.0)
Monocytes Absolute: 0.4 10*3/uL (ref 0.1–1.0)
Monocytes Relative: 6.3 % (ref 3.0–12.0)
NEUTROS PCT: 73 % (ref 43.0–77.0)
Neutro Abs: 4.6 10*3/uL (ref 1.4–7.7)
Platelets: 212 10*3/uL (ref 150.0–400.0)
RBC: 3.73 Mil/uL — ABNORMAL LOW (ref 4.22–5.81)
RDW: 15 % (ref 11.5–15.5)
WBC: 6.2 10*3/uL (ref 4.0–10.5)

## 2018-04-13 LAB — COMPREHENSIVE METABOLIC PANEL
ALK PHOS: 62 U/L (ref 39–117)
ALT: 32 U/L (ref 0–53)
AST: 24 U/L (ref 0–37)
Albumin: 3.6 g/dL (ref 3.5–5.2)
BILIRUBIN TOTAL: 0.3 mg/dL (ref 0.2–1.2)
BUN: 27 mg/dL — ABNORMAL HIGH (ref 6–23)
CO2: 21 mEq/L (ref 19–32)
Calcium: 8.1 mg/dL — ABNORMAL LOW (ref 8.4–10.5)
Chloride: 111 mEq/L (ref 96–112)
Creatinine, Ser: 1.68 mg/dL — ABNORMAL HIGH (ref 0.40–1.50)
GFR: 41.22 mL/min — ABNORMAL LOW (ref >60.00)
Glucose, Bld: 185 mg/dL — ABNORMAL HIGH (ref 70–99)
Potassium: 3.5 mEq/L (ref 3.5–5.1)
Sodium: 141 mEq/L (ref 135–145)
Total Protein: 5.9 g/dL — ABNORMAL LOW (ref 6.0–8.3)

## 2018-04-13 LAB — C-REACTIVE PROTEIN: CRP: 0.2 mg/dL — ABNORMAL LOW (ref 0.5–20.0)

## 2018-04-14 ENCOUNTER — Other Ambulatory Visit: Payer: Medicare Other

## 2018-04-14 DIAGNOSIS — R197 Diarrhea, unspecified: Secondary | ICD-10-CM

## 2018-04-14 LAB — CELIAC PANEL 10
Antigliadin Abs, IgA: 13 units (ref 0–19)
Endomysial IgA: NEGATIVE
Gliadin IgG: 3 units (ref 0–19)
IgA/Immunoglobulin A, Serum: 258 mg/dL (ref 61–437)
Tissue Transglut Ab: <2 U/mL (ref 0–5)
Transglutaminase IgA: <2 U/mL (ref 0–3)

## 2018-04-14 LAB — HEMOCCULT SLIDES (X 3 CARDS)
FECAL OCCULT BLD: NEGATIVE
OCCULT 1: NEGATIVE
OCCULT 2: NEGATIVE
OCCULT 3: NEGATIVE
OCCULT 4: NEGATIVE
OCCULT 5: NEGATIVE

## 2018-04-16 DIAGNOSIS — H43813 Vitreous degeneration, bilateral: Secondary | ICD-10-CM | POA: Diagnosis not present

## 2018-04-16 DIAGNOSIS — H524 Presbyopia: Secondary | ICD-10-CM | POA: Diagnosis not present

## 2018-04-16 DIAGNOSIS — H353134 Nonexudative age-related macular degeneration, bilateral, advanced atrophic with subfoveal involvement: Secondary | ICD-10-CM | POA: Diagnosis not present

## 2018-04-16 DIAGNOSIS — H40023 Open angle with borderline findings, high risk, bilateral: Secondary | ICD-10-CM | POA: Diagnosis not present

## 2018-04-16 LAB — HM DIABETES EYE EXAM

## 2018-04-19 ENCOUNTER — Other Ambulatory Visit: Payer: Self-pay

## 2018-04-19 DIAGNOSIS — A0472 Enterocolitis due to Clostridium difficile, not specified as recurrent: Secondary | ICD-10-CM

## 2018-04-19 MED ORDER — VANCOMYCIN HCL 125 MG PO CAPS: 125.0000 mg | ORAL_CAPSULE | Freq: Four times a day (QID) | ORAL | 0 refills | Status: AC

## 2018-04-19 NOTE — Progress Notes (Signed)
Order placed in Epic per MD recommendation for antibx therapy;

## 2018-04-27 LAB — GASTROINTESTINAL PATHOGEN PANEL PCR
C. difficile Tox A/B, PCR: DETECTED — AB
Campylobacter, PCR: NOT DETECTED
Cryptosporidium, PCR: NOT DETECTED
E COLI 0157, PCR: NOT DETECTED
E coli (ETEC) LT/ST PCR: NOT DETECTED
E coli (STEC) stx1/stx2, PCR: NOT DETECTED
Giardia lamblia, PCR: NOT DETECTED
Norovirus, PCR: NOT DETECTED
Rotavirus A, PCR: NOT DETECTED
SALMONELLA, PCR: NOT DETECTED
Shigella, PCR: NOT DETECTED

## 2018-04-27 LAB — PANCREATIC ELASTASE, FECAL: Pancreatic Elastase-1, Stool: 333 mcg/g

## 2018-05-02 ENCOUNTER — Other Ambulatory Visit: Payer: Self-pay | Admitting: Gastroenterology

## 2018-05-03 ENCOUNTER — Ambulatory Visit (INDEPENDENT_AMBULATORY_CARE_PROVIDER_SITE_OTHER): Payer: Medicare Other

## 2018-05-03 DIAGNOSIS — I441 Atrioventricular block, second degree: Secondary | ICD-10-CM

## 2018-05-04 ENCOUNTER — Encounter: Payer: Self-pay | Admitting: Family Medicine

## 2018-05-04 ENCOUNTER — Ambulatory Visit (INDEPENDENT_AMBULATORY_CARE_PROVIDER_SITE_OTHER): Payer: Medicare Other | Admitting: Family Medicine

## 2018-05-04 VITALS — BP 163/58 | HR 61 | Temp 97.5°F | Wt 161.2 lb

## 2018-05-04 DIAGNOSIS — I10 Essential (primary) hypertension: Secondary | ICD-10-CM | POA: Diagnosis not present

## 2018-05-04 DIAGNOSIS — E7800 Pure hypercholesterolemia, unspecified: Secondary | ICD-10-CM | POA: Diagnosis not present

## 2018-05-04 DIAGNOSIS — I480 Paroxysmal atrial fibrillation: Secondary | ICD-10-CM | POA: Diagnosis not present

## 2018-05-04 DIAGNOSIS — M109 Gout, unspecified: Secondary | ICD-10-CM | POA: Diagnosis not present

## 2018-05-04 DIAGNOSIS — I25810 Atherosclerosis of coronary artery bypass graft(s) without angina pectoris: Secondary | ICD-10-CM

## 2018-05-04 DIAGNOSIS — R7303 Prediabetes: Secondary | ICD-10-CM | POA: Diagnosis not present

## 2018-05-04 DIAGNOSIS — I5032 Chronic diastolic (congestive) heart failure: Secondary | ICD-10-CM

## 2018-05-04 LAB — COMPREHENSIVE METABOLIC PANEL
ALT: 49 U/L (ref 0–53)
AST: 28 U/L (ref 0–37)
Albumin: 3.8 g/dL (ref 3.5–5.2)
Alkaline Phosphatase: 63 U/L (ref 39–117)
BUN: 27 mg/dL — ABNORMAL HIGH (ref 6–23)
CO2: 28 mEq/L (ref 19–32)
Calcium: 8.5 mg/dL (ref 8.4–10.5)
Chloride: 108 mEq/L (ref 96–112)
Creatinine, Ser: 1.62 mg/dL — ABNORMAL HIGH (ref 0.40–1.50)
GFR: 42.98 mL/min — ABNORMAL LOW (ref >60.00)
Glucose, Bld: 104 mg/dL — ABNORMAL HIGH (ref 70–99)
POTASSIUM: 3.8 meq/L (ref 3.5–5.1)
Sodium: 143 mEq/L (ref 135–145)
TOTAL PROTEIN: 6.1 g/dL (ref 6.0–8.3)
Total Bilirubin: 0.5 mg/dL (ref 0.2–1.2)

## 2018-05-04 LAB — CUP PACEART REMOTE DEVICE CHECK
Battery Remaining Longevity: 115 mo
Battery Remaining Percentage: 95.5 %
Battery Voltage: 3.01 V
Brady Statistic AP VP Percent: 22 %
Brady Statistic AP VS Percent: 1 %
Brady Statistic AS VS Percent: 5.9 %
Brady Statistic RA Percent Paced: 18 %
Brady Statistic RV Percent Paced: 94 %
Date Time Interrogation Session: 20200106073147
Implantable Lead Implant Date: 20190402
Implantable Lead Implant Date: 20190402
Implantable Lead Location: 753859
Implantable Lead Location: 753860
Implantable Pulse Generator Implant Date: 20190402
Lead Channel Impedance Value: 490 Ohm
Lead Channel Pacing Threshold Amplitude: 0.625 V
Lead Channel Pacing Threshold Amplitude: 0.75 V
Lead Channel Pacing Threshold Pulse Width: 0.5 ms
Lead Channel Sensing Intrinsic Amplitude: 12 mV
Lead Channel Sensing Intrinsic Amplitude: 2.3 mV
Lead Channel Setting Pacing Amplitude: 0.875
Lead Channel Setting Pacing Amplitude: 2 V
Lead Channel Setting Pacing Pulse Width: 0.5 ms
Lead Channel Setting Sensing Sensitivity: 2 mV
MDC IDC MSMT LEADCHNL RA IMPEDANCE VALUE: 360 Ohm
MDC IDC MSMT LEADCHNL RV PACING THRESHOLD PULSEWIDTH: 0.5 ms
MDC IDC STAT BRADY AS VP PERCENT: 71 %
Pulse Gen Model: 2272
Pulse Gen Serial Number: 9003454

## 2018-05-04 LAB — TSH: TSH: 2.68 u[IU]/mL (ref 0.35–4.50)

## 2018-05-04 LAB — HEMOGLOBIN A1C: Hgb A1c MFr Bld: 6.4 % (ref 4.6–6.5)

## 2018-05-04 MED ORDER — POTASSIUM CHLORIDE ER 10 MEQ PO TBCR: 10.0000 meq | EXTENDED_RELEASE_TABLET | Freq: Every day | ORAL | 3 refills | Status: DC

## 2018-05-04 NOTE — Patient Instructions (Signed)
Keep BP med the same for now.  Take lasix next 3 days. If fluid remains- continue taking it daily and  Monitor BP daily--> if BP does not < 096 systolic with lasix--> then we will need to increase amlodipine to 7.5 mg QD.   When we call you back--- please lets [place] know your BP after lasix and we will guide you.  I will call you with lab results today.    Please help [place] help you:  We are honored you have chosen [facility] for your Primary Care home. Below you will find basic instructions that you may need to access in the future. Please help [place] help you by reading the instructions, which cover many of the frequent questions we experience.   Prescription refills and request:  -In order to allow more efficient response time, please call your pharmacy for all refills. They will forward the request electronically to [place]. This allows for the quickest possible response. Request left on a nurse line can take longer to refill, since these are checked as time allows between office patients and other phone calls.  - refill request can take up to 3-5 working days to complete.  - If request is sent electronically and request is appropiate, it is usually completed in 1-2 business days.  - all patients will need to be seen routinely for all chronic medical conditions requiring prescription medications (see follow-up below). If you are overdue for follow up on your condition, you will be asked to make an appointment and we will call in enough medication to cover you until your appointment (up to 30 days).  - all controlled substances will require a face to face visit to request/refill.  - if you desire your prescriptions to go through a new pharmacy, and have an active script at original pharmacy, you will need to call your pharmacy and have scripts transferred to new pharmacy. This is completed between the pharmacy locations and not by your provider.    Results: If any images or labs were ordered, it can  take up to 1 week to get results depending on the test ordered and the lab/facility running and resulting the test. - Normal or stable results, which do not need further discussion, may be released to your mychart immediately with attached note to you. A call may not be generated for normal results. Please make certain to sign up for mychart. If you have questions on how to activate your mychart you can call the front office.  - If your results need further discussion, our office will attempt to contact you via phone, and if unable to reach you after 2 attempts, we will release your abnormal result to your mychart with instructions.  - All results will be automatically released in mychart after 1 week.  - Your provider will provide you with explanation and instruction on all relevant material in your results. Please keep in mind, results and labs may appear confusing or abnormal to the untrained eye, but it does not mean they are actually abnormal for you personally. If you have any questions about your results that are not covered, or you desire more detailed explanation than what was provided, you should make an appointment with your provider to do so.   Our office handles many outgoing and incoming calls daily. If we have not contacted you within 1 week about your results, please check your mychart to see if there is a message first and if not, then  contact our office.  In helping with this matter, you help decrease call volume, and therefore allow [place] to be able to respond to patients needs more efficiently.   Acute office visits (sick visit):  An acute visit is intended for a new problem and are scheduled in shorter time slots to allow schedule openings for patients with new problems. This is the appropriate visit to discuss a new problem. Problems will not be addressed by phone call or Echart message. Appointment is needed if requesting treatment. In order to provide you with excellent quality medical  care with proper time for you to explain your problem, have an exam and receive treatment with instructions, these appointments should be limited to one new problem per visit. If you experience a new problem, in which you desire to be addressed, please make an acute office visit, we save openings on the schedule to accommodate you. Please do not save your new problem for any other type of visit, let us take care of it properly and quickly for you.   Follow up visits:  Depending on your condition(s) your provider will need to see you routinely in order to provide you with quality care and prescribe medication(s). Most chronic conditions (Example: hypertension, Diabetes, depression/anxiety... etc), require visits a couple times a year. Your provider will instruct you on proper follow up for your personal medical conditions and history. Please make certain to make follow up appointments for your condition as instructed. Failing to do so could result in lapse in your medication treatment/refills. If you request a refill, and are overdue to be seen on a condition, we will always provide you with a 30 day script (once) to allow you time to schedule.    Medicare wellness (well visit): - we have a wonderful Nurse Maudie Mercury), that will meet with you and provide you will yearly medicare wellness visits. These visits should occur yearly (can not be scheduled less than 1 calendar year apart) and cover preventive health, immunizations, advance directives and screenings you are entitled to yearly through your medicare benefits. Do not miss out on your entitled benefits, this is when medicare will pay for these benefits to be ordered for you.  These are strongly encouraged by your provider and is the appropriate type of visit to make certain you are up to date with all preventive health benefits. If you have not had your medicare wellness exam in the last 12 months, please make certain to schedule one by calling the office and  schedule your medicare wellness with Maudie Mercury as soon as possible.   Yearly physical (well visit):  - Adults are recommended to be seen yearly for physicals. Check with your insurance and date of your last physical, most insurances require one calendar year between physicals. Physicals include all preventive health topics, screenings, medical exam and labs that are appropriate for gender/age and history. You may have fasting labs needed at this visit. This is a well visit (not a sick visit), new problems should not be covered during this visit (see acute visit).  - Pediatric patients are seen more frequently when they are younger. Your provider will advise you on well child visit timing that is appropriate for your their age. - This is not a medicare wellness visit. Medicare wellness exams do not have an exam portion to the visit. Some medicare companies allow for a physical, some do not allow a yearly physical. If your medicare allows a yearly physical you can schedule the medicare wellness  with our nurse Maudie Mercury and have your physical with your provider after, on the same day. Please check with insurance for your full benefits.   Late Policy/No Shows:  - all new patients should arrive 15-30 minutes earlier than appointment to allow [place] time  to  obtain all personal demographics,  insurance information and for you to complete office paperwork. - All established patients should arrive 10-15 minutes earlier than appointment time to update all information and be checked in .  - In our best efforts to run on time, if you are late for your appointment you will be asked to either reschedule or if able, we will work you back into the schedule. There will be a wait time to work you back in the schedule,  depending on availability.  - If you are unable to make it to your appointment as scheduled, please call 24 hours ahead of time to allow [place] to fill the time slot with someone else who needs to be seen. If you do not  cancel your appointment ahead of time, you may be charged a no show fee.

## 2018-05-04 NOTE — Progress Notes (Signed)
Remote pacemaker transmission.   

## 2018-05-04 NOTE — Progress Notes (Signed)
[name] , 15-Jan-1929, 85 y.o., male MRN: [id number] Patient Care Team    Relationship Specialty Notifications Start End  [name], DO PCP - General Family Medicine  12/18/15   [name], MD PCP - Cardiology Cardiology  03/17/17   [name], MD Consulting Physician Ophthalmology  12/18/15   [name], MD Consulting Physician Dermatology  12/18/15   [name], MD Consulting Physician Cardiology  12/18/15   [name], MD Consulting Physician Ophthalmology  12/19/15   [name], MD Consulting Physician Oncology  09/30/16   [name], MD Attending Physician Nephrology  09/30/16   [name], MD Consulting Physician Dermatology  09/30/16     Chief Complaint  Patient presents with  . Follow-up    Pt states refills were denied for both Zyloprim and potassium    Subjective:  Hypertension/CKD3/A.Fib:  Patient reports compliance Coreg 6.25  twice a day and amlodipine 5 mg QD. Lasix 20 mg daily PRN-she started yesterday again. Patient denies chest pain, shortness of breath, dizziness or lower extremity edema.  Patient reports compliance with Eliquis 2.5 mg twice a day. He takes fish oil supplementation of 1000 mg daily. Lipitor 20 mg daily.  Reports he is feeling pretty good.  He was just treated for C. difficile with treatment initiated with vancomycin oral.  However he states that he started to feel really bad on that medication and had some myalgias and discomfort.  He ported this to his gastroenterologist vancomycin oral was stopped and he finished the course with Flagyl.  He states he feels much improved. BMP: 04/12/2018 GFR 41.22, creatinine 1.68, calcium 8.1.  Potassium 3.5 CBC: 08/11/2017 hemoglobin 11.6, hematocrit 34.1-this is baseline Lipid: 06/21/2017 within normal limits PTH/calcium/vitamin D: 01/15/2017 within normal limits--recheck next visit Diet: Low sodium diet followed Exercise: very active RF: CKD3, HLD, CAD w/ CABG (1999),  cardiac cath 2011 2/2 CP (all grafts normal), PVD, Afib Significant cardiac history of CAD with prior CABG in 1999 -LIMA to the LAD, SVG to IM, OM1, OM 2, SVG to D1, SVG to PDA/PLA, EF 45-50% with mild diffuse hypokinesis. Grafts patent by cath 2011 Nuclear stress test 08/2015 scar, no ischemia LVEF 70% also has atrial fibrillation/flutter in 2014 on Eliquis status post [facility] 10/2014, PVD, claudication.  Cardioversion 04/01/2017.  Pacemaker implant 07/28/2017.  Echo 04/03/17 EF 95-62%  Grade 2 diastolic  Mild to moderate MR Moderate LAE mild RAE   Allergies  Allergen Reactions  . Penicillins Hives    Has patient had a PCN reaction causing immediate rash, facial/tongue/throat swelling, SOB or lightheadedness with hypotension: No Has patient had a PCN reaction causing severe rash involving mucus membranes or skin necrosis: Yes Has patient had a PCN reaction that required hospitalization: No Has patient had a PCN reaction occurring within the last 10 years: No If all of the above answers are "NO", then may proceed with Cephalosporin use.    Social History   Tobacco Use  . Smoking status: Former Smoker    Last attempt to quit: 12/25/1961    Years since quitting: 56.3  . Smokeless tobacco: Former Systems developer    Types: Chew    Quit date: 02/03/1969  Substance Use Topics  . Alcohol use: No    Alcohol/week: 0.0 standard drinks   Past Medical History:  Diagnosis Date  . A-fib ([place])   . Basal cell carcinoma    skin  . Bigeminal rhythm   . Cataract   . Chronic  renal insufficiency, stage 3 (moderate) ([place]) 2018   GFR 30s-40s  . Coronary artery disease    post bypass  . CVD (cardiovascular disease)   . Diabetes mellitus without complication ([place])   . Diverticular disease   . Easy bruising   . 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 Gout   . Hernia   . Hyperlipidemia   . Hypertension   . IBS (irritable bowel syndrome)   . Iron deficiency anemia 02/04/2016  . Macular degeneration   .  Microscopic colitis   . PVD (peripheral vascular disease) ([place])    Past Surgical History:  Procedure Laterality Date  . CARDIAC CATHETERIZATION  2006  . CARDIOVERSION N/A 02/22/2013   Procedure: CARDIOVERSION;  Surgeon: [name], MD;  Location: [facility] ENDOSCOPY;  Service: Cardiovascular;  Laterality: N/A;  . CARDIOVERSION N/A 11/15/2014   Procedure: CARDIOVERSION;  Surgeon: [name], MD;  Location: [facility] ENDOSCOPY;  Service: Cardiovascular;  Laterality: N/A;  . CARDIOVERSION N/A 04/01/2017   Procedure: CARDIOVERSION;  Surgeon: Larey Dresser, MD;  Location: [facility];  Service: Cardiovascular;  Laterality: N/A;  . CAROTID ENDARTERECTOMY  2009/ 1993   left/ right  . COLONOSCOPY  03/17/2005   The colon is normal.  . CORONARY ARTERY BYPASS GRAFT  1999  . ESOPHAGOGASTRODUODENOSCOPY  03/17/2005   Normal esophagus. Normal Stomanch. Normal duodenum.   Marland Kitchen EYE SURGERY     eyelid repair  . INGUINAL HERNIA REPAIR  02/11/2012   Procedure: LAPAROSCOPIC BILATERAL INGUINAL HERNIA REPAIR;  Surgeon: Pedro Earls, MD;  Location: [facility] ORS;  Service: General;  Laterality: Bilateral;  . PACEMAKER IMPLANT N/A 07/28/2017   St Jude Medical Assurity MRI conditional  dual-chamber pacemaker for symptomatic second degree AV block by Dr Rayann Heman  . SKIN CANCER EXCISION     right ear x 3   Family History  Problem Relation Age of Onset  . Stroke Mother   . Coronary artery disease Father   . Heart disease Father   . Melanoma Sister   . Colon cancer Neg Hx    Allergies as of 05/04/2018      Reactions   Penicillins Hives   Has patient had a PCN reaction causing immediate rash, facial/tongue/throat swelling, SOB or lightheadedness with hypotension: No Has patient had a PCN reaction causing severe rash involving mucus membranes or skin necrosis: Yes Has patient had a PCN reaction that required hospitalization: No Has patient had a PCN reaction occurring within the last 10 years: No If all of the above  answers are "NO", then may proceed with Cephalosporin use.      Medication List       Accurate as of May 04, 2018 10:05 AM. Always use your most recent med list.        ACCU-CHEK FASTCLIX LANCETS Misc Check fasting blood sugar once daily   acetaminophen 500 MG tablet Commonly known as:  TYLENOL Take 500 mg by mouth every 6 (six) hours as needed (for pain.).   allopurinol 300 MG tablet Commonly known as:  ZYLOPRIM Take 0.5 tablets (150 mg total) by mouth daily.   Aloe Vera 25 MG Caps Take 25 mg by mouth 2 (two) times daily.   amLODipine 5 MG tablet Commonly known as:  NORVASC Take 1 tablet (5 mg total) by mouth daily.   atorvastatin 20 MG tablet Commonly known as:  LIPITOR TAKE 1 TABLET AT BEDTIME   carvedilol 6.25 MG tablet Commonly known as:  COREG TAKE 1 TABLET BY MOUTH TWICE  A DAY   cholecalciferol 1000 units tablet Commonly known as:  VITAMIN D Take 1,000 Units by mouth daily.   CHROMIUM GTF PO Take 2 capsules by mouth every evening.   Cinnamon 500 MG Tabs Take 2 tablets by mouth at bedtime.   Co Q 10 100 MG Caps Take 100 mg by mouth daily.   dicyclomine 10 MG capsule Commonly known as:  BENTYL Take 1 capsule (10 mg total) by mouth 4 (four) times daily -  before meals and at bedtime.   diphenoxylate-atropine 2.5-0.025 MG tablet Commonly known as:  LOMOTIL Take 1 tablet by mouth 3 (three) times daily as needed for diarrhea or loose stools.   ELIQUIS 2.5 MG Tabs tablet Generic drug:  apixaban TAKE 1 TABLET TWICE A DAY   finasteride 5 MG tablet Commonly known as:  PROSCAR Take 5 mg by mouth daily.   fish oil-omega-3 fatty acids 1000 MG capsule Take 1 g by mouth daily.   furosemide 20 MG tablet Commonly known as:  LASIX Take 1 tablet (20 mg total) by mouth as directed.   glucose blood test strip Commonly known as:  ACCU-CHEK GUIDE Check fasting blood sugar once daily   potassium chloride 10 MEQ tablet Commonly known as:  K-DUR Take 1  tablet (10 mEq total) by mouth daily.   PRESERVISION AREDS PO Take 1 tablet by mouth 2 (two) times daily.   PROBIOTIC PO Take 1 tablet by mouth daily.   tamsulosin 0.4 MG Caps capsule Commonly known as:  FLOMAX TAKE 1 CAPSULE BY MOUTH ONCE DAILY AFTER SUPPER   VANADYL SULFATE PO Take 1 capsule by mouth every evening.   zinc gluconate 50 MG tablet Take 50 mg by mouth daily.       No results found for this or any previous visit (from the past 24 hour(s)). No results found.   ROS: Negative, with the exception of above mentioned in HPI  Objective:  BP (!) 163/58 (BP Location: Left Arm, Patient Position: Sitting, Cuff Size: Normal)   Pulse 61   Temp (!) 97.5 F (36.4 C) (Oral)   Wt 161 lb 3.2 oz (73.1 kg)   SpO2 99%   BMI 24.51 kg/m  Body mass index is 24.51 kg/m. Gen: Afebrile. No acute distress.  nontoxic and presentation.  Very pleasant Caucasian male. HENT: AT. [place]. MMM.  Eyes:Pupils Equal Round Reactive to light, Extraocular movements intact,  Conjunctiva without redness, discharge or icterus. CV: RRR no murmur , + 1 edema, +2/4 P posterior tibialis pulses Chest: CTAB, no wheeze or crackles Abd: Soft. NTND. BS present Skin: No rashes, purpura or petechiae.  Neuro:  Normal gait. PERLA. EOMi. Alert. Oriented.  Psych: Normal affect, dress and demeanor. Normal speech. Normal thought content and judgment.   Assessment/Plan: HISHAM PROVENCE is a 85 y.o. male present for OV for  Essential hypertension/Paroxysmal atrial fibrillation (HCC)/Stage 3 chronic kidney disease/HLD//CHF/CAD/PVD -Stable BP.  Continue amlodipine 5 mg daily, Lipitor 20 mg daily, Lasix 20 mg daily for next 3 days scheduled and then QD PRN, fish oil supplementation, Eliquis 2.5 mg twice daily, Coreg 6.25 twice daily.   -He does have some fluid on him today possibly driving up his blood pressures.  He reports home pressures that are rather consistent with the readings we have today.  His heart rate is 61  and he reports his heart rate being 60 at home as well.  Heart rate seems to no longer be the issue, not wanting to increase his  Coreg/beta-blocker.  I asked him to monitor his blood pressure over the next few days on Lasix and will touch base with him once labs return.  If blood pressures are still elevated above goal and fluid is controlled, would increase amlodipine to 1.5 tabs a day (7.5 mg)- if fluid still an issue continue lasix daily instead.  He does get some swelling at high doses of amlodipine.   - However he also has difficulty controlling potassium on ace/arb- so lisinopril was dc'd in the past 2/2 to hyperkalemia - now he is on a potasium supplement daily to maintain levels. Refilled K-Dur 10 Meq for him today. - his weight is also up 5 lbs in 3 weeks, which may be fluid or the holidays. - Low-sodium diet.  -  He is very difficult to control and his hydration and IDA/epo injections cause fluctuations leading to both highs and lower than desired readings. Certainly would rather mildly elevated BP over too low.  - Follow-up 6 months, as long as seeing Dr. Johnsie Cancel routinely as well.  Erythropoietin deficiency anemia/iron deficiency anemia -Continue routine follow-ups with hematology.  Prediabetes - Has been diet controlled.  - HgB A1c  Gout, unspecified cause, unspecified chronicity, unspecified site - refills on renally dosed allopurinol.   electronically signed by:  Howard Pouch, DO  [place]

## 2018-05-05 ENCOUNTER — Other Ambulatory Visit: Payer: Self-pay | Admitting: *Deleted

## 2018-05-05 ENCOUNTER — Telehealth: Payer: Self-pay | Admitting: Family Medicine

## 2018-05-05 ENCOUNTER — Telehealth: Payer: Self-pay | Admitting: Gastroenterology

## 2018-05-05 NOTE — Telephone Encounter (Signed)
Please review additional charting

## 2018-05-05 NOTE — Telephone Encounter (Signed)
Pt said he is returning your call [phone]

## 2018-05-05 NOTE — Telephone Encounter (Signed)
Please inform patient the following information: First ask him how his BP/HR are running with lasix- lowest and highest BP? And is fluid gone. His kidney function is stable.  His a1c is more elevated than prior 6.4. Cut back on the sugar- still does need diabetes med.  We will call him back with BP medication recs after I hear what they are looking like now after lasix.

## 2018-05-05 NOTE — Telephone Encounter (Signed)
SW pt, he had not been writing his BP/HR's down but the best he can remember his highest BP reading was yesterday morning BP: 162/70 HR: 61 and his lowest reading was this morning BP: 152/60 HR: 70. He still has swelling/fluid, he took lasix's yesterday and today and will take daily until otherwise told. I did advises pt to start writing his BP/HR's down so that we can better address his BP's. Pt agreed.

## 2018-05-06 ENCOUNTER — Other Ambulatory Visit: Payer: Self-pay

## 2018-05-06 ENCOUNTER — Encounter: Payer: Self-pay | Admitting: Family Medicine

## 2018-05-06 MED ORDER — ATORVASTATIN CALCIUM 20 MG PO TABS: 20.0000 mg | ORAL_TABLET | Freq: Every day | ORAL | 3 refills | Status: DC

## 2018-05-06 MED ORDER — GLUCOSE BLOOD VI STRP: ORAL_STRIP | 3 refills | Status: DC

## 2018-05-06 MED ORDER — ALLOPURINOL 300 MG PO TABS: 150.0000 mg | ORAL_TABLET | Freq: Every day | ORAL | 3 refills | Status: DC

## 2018-05-06 MED ORDER — ACCU-CHEK FASTCLIX LANCETS MISC: 3 refills | Status: DC

## 2018-05-06 NOTE — Telephone Encounter (Signed)
Please call patient and relay the following message: Would like him to continue monitoring his blood pressure and heart rate over the next week. Continue the Lasix 1 a day until fluid resolves or blood pressure below 998 systolic.  While taking Lasix daily, he should increase his potassium supplement to 2 pills a day, instead of 1-just while taking Lasix daily.  If he is able to stop the Lasix, return to taking 1 potassium supplement a day. Follow-up in 1 week (with provider) with blood pressure and heart rate readings monitored daily at least 2 hours after medications.  We will be able to better judge at that time if increase and blood pressure is from fluid alone or if his blood pressure regimen needs changed.

## 2018-05-06 NOTE — Telephone Encounter (Signed)
Pharmacy requesting rf on pts blood testing equipment. Rfed.

## 2018-05-06 NOTE — Telephone Encounter (Signed)
LM for patient to call to discuss notes below.   CRM created asking that they call office so that we can give patient information.

## 2018-05-07 NOTE — Telephone Encounter (Signed)
Patient advised of information and provider instructions.  Patient states he will never get his BP under 525 systolic.  Patient advised to stay on lasix and take 2 potassium tablets daily.  He is scheduled to see Dr Raoul Pitch 05/13/2018 @ 9am.

## 2018-05-13 ENCOUNTER — Ambulatory Visit (INDEPENDENT_AMBULATORY_CARE_PROVIDER_SITE_OTHER): Payer: Medicare Other | Admitting: Family Medicine

## 2018-05-13 ENCOUNTER — Encounter: Payer: Self-pay | Admitting: Family Medicine

## 2018-05-13 VITALS — BP 130/56 | HR 71 | Temp 97.5°F | Ht 68.0 in | Wt 159.0 lb

## 2018-05-13 DIAGNOSIS — I10 Essential (primary) hypertension: Secondary | ICD-10-CM

## 2018-05-13 MED ORDER — CARVEDILOL 6.25 MG PO TABS: 6.2500 mg | ORAL_TABLET | Freq: Two times a day (BID) | ORAL | 1 refills | Status: DC

## 2018-05-13 MED ORDER — AMLODIPINE BESYLATE 5 MG PO TABS: 5.0000 mg | ORAL_TABLET | Freq: Every day | ORAL | 1 refills | Status: DC

## 2018-05-13 MED ORDER — FUROSEMIDE 20 MG PO TABS: 20.0000 mg | ORAL_TABLET | ORAL | 3 refills | Status: DC

## 2018-05-13 NOTE — Patient Instructions (Addendum)
I am glad you are doing well.  Your BP is better. Your fluid and weight are down also.  Keep the other BP meds the same.   You can stop lasix- if you gain 3 lbs with fluid increase abruptly, and BP go up --> restart lasix (and increase your potassium to 2 tabs with lasix) until fluid off and weight back.    As long as you are doing well, see you in 6 months unless needed sooner.

## 2018-05-13 NOTE — Progress Notes (Signed)
[name] , 1928-11-25, 85 y.o., male MRN: [id number] Patient Care Team    Relationship Specialty Notifications Start End  [name], DO PCP - General Family Medicine  12/18/15   [name], MD PCP - Cardiology Cardiology  03/17/17   [name], MD Consulting Physician Ophthalmology  12/18/15   [name], MD Consulting Physician Dermatology  12/18/15   [name], MD Consulting Physician Cardiology  12/18/15   [name], MD Consulting Physician Ophthalmology  12/19/15   [name], MD Consulting Physician Oncology  09/30/16   [name], MD Attending Physician Nephrology  09/30/16   [name], MD Consulting Physician Dermatology  09/30/16     Chief Complaint  Patient presents with  . Follow-up    HTN    Subjective:  Hypertension/CKD3/A.Fib:  Patient reports compliance Coreg 6.25  twice a day and amlodipine 5 mg QD.  Patient reports compliance with Eliquis 2.5 mg twice a day. He takes fish oil supplementation of 1000 mg daily. Lipitor 20 mg daily.  Reports he is feeling even better today. He has been using the lasix as instructed and the extra potassium pill with the lasix use. He feels the fluid is almost resolved. He brings in BP readings today much better 130-150 (moslty 130s)/51-69, HR 59-80 (mostly 60s).   BMP: 05/04/2018 GFR 42, creatinine 1.62, calcium 8.5.  Potassium 3.8 CBC: 08/11/2017 hemoglobin 11.6, hematocrit 34.1-this is baseline Lipid: 06/21/2017 within normal limits PTH/calcium/vitamin D: 01/15/2017 within normal limits--recheck next visit Diet: Low sodium diet followed Exercise: very active RF: CKD3, HLD, CAD w/ CABG (1999), cardiac cath 2011 2/2 CP (all grafts normal), PVD, Afib Significant cardiac history of CAD with prior CABG in 1999 -LIMA to the LAD, SVG to IM, OM1, OM 2, SVG to D1, SVG to PDA/PLA, EF 45-50% with mild diffuse hypokinesis. Grafts patent by cath 2011 Nuclear stress test 08/2015 scar, no ischemia LVEF 70%  also has atrial fibrillation/flutter in 2014 on Eliquis status post [facility] 10/2014, PVD, claudication.  Cardioversion 04/01/2017.  Pacemaker implant 07/28/2017.  Echo 04/03/17 EF 13-08%  Grade 2 diastolic  Mild to moderate MR Moderate LAE mild RAE   Allergies  Allergen Reactions  . Penicillins Hives    Has patient had a PCN reaction causing immediate rash, facial/tongue/throat swelling, SOB or lightheadedness with hypotension: No Has patient had a PCN reaction causing severe rash involving mucus membranes or skin necrosis: Yes Has patient had a PCN reaction that required hospitalization: No Has patient had a PCN reaction occurring within the last 10 years: No If all of the above answers are "NO", then may proceed with Cephalosporin use.    Social History   Tobacco Use  . Smoking status: Former Smoker    Last attempt to quit: 12/25/1961    Years since quitting: 56.4  . Smokeless tobacco: Former Systems developer    Types: Chew    Quit date: 02/03/1969  Substance Use Topics  . Alcohol use: No    Alcohol/week: 0.0 standard drinks   Past Medical History:  Diagnosis Date  . A-fib ([place])   . Basal cell carcinoma    skin  . Bigeminal rhythm   . Cataract   . Chronic renal insufficiency, stage 3 (moderate) (HCC) 2018   GFR 30s-40s  . Coronary artery disease    post bypass  . CVD (cardiovascular disease)   . Diabetes mellitus without complication ([place])   . Diverticular disease   . Easy bruising   .  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 Gout   . Hernia   . Hyperlipidemia   . Hypertension   . IBS (irritable bowel syndrome)   . Iron deficiency anemia 02/04/2016  . Macular degeneration   . Microscopic colitis   . PVD (peripheral vascular disease) ([place])    Past Surgical History:  Procedure Laterality Date  . CARDIAC CATHETERIZATION  2006  . CARDIOVERSION N/A 02/22/2013   Procedure: CARDIOVERSION;  Surgeon: [name], MD;  Location: [facility] ENDOSCOPY;  Service: Cardiovascular;  Laterality:  N/A;  . CARDIOVERSION N/A 11/15/2014   Procedure: CARDIOVERSION;  Surgeon: [name], MD;  Location: [facility] ENDOSCOPY;  Service: Cardiovascular;  Laterality: N/A;  . CARDIOVERSION N/A 04/01/2017   Procedure: CARDIOVERSION;  Surgeon: Larey Dresser, MD;  Location: [place];  Service: Cardiovascular;  Laterality: N/A;  . CAROTID ENDARTERECTOMY  2009/ 1993   left/ right  . COLONOSCOPY  03/17/2005   The colon is normal.  . CORONARY ARTERY BYPASS GRAFT  1999  . ESOPHAGOGASTRODUODENOSCOPY  03/17/2005   Normal esophagus. Normal Stomanch. Normal duodenum.   Marland Kitchen EYE SURGERY     eyelid repair  . INGUINAL HERNIA REPAIR  02/11/2012   Procedure: LAPAROSCOPIC BILATERAL INGUINAL HERNIA REPAIR;  Surgeon: Pedro Earls, MD;  Location: [facility] ORS;  Service: General;  Laterality: Bilateral;  . PACEMAKER IMPLANT N/A 07/28/2017   St Jude Medical Assurity MRI conditional  dual-chamber pacemaker for symptomatic second degree AV block by Dr Rayann Heman  . SKIN CANCER EXCISION     right ear x 3   Family History  Problem Relation Age of Onset  . Stroke Mother   . Coronary artery disease Father   . Heart disease Father   . Melanoma Sister   . Colon cancer Neg Hx    Allergies as of 05/13/2018      Reactions   Penicillins Hives   Has patient had a PCN reaction causing immediate rash, facial/tongue/throat swelling, SOB or lightheadedness with hypotension: No Has patient had a PCN reaction causing severe rash involving mucus membranes or skin necrosis: Yes Has patient had a PCN reaction that required hospitalization: No Has patient had a PCN reaction occurring within the last 10 years: No If all of the above answers are "NO", then may proceed with Cephalosporin use.      Medication List       Accurate as of May 13, 2018  9:04 AM. Always use your most recent med list.        ACCU-CHEK FASTCLIX LANCETS Misc Check fasting blood sugar once daily   allopurinol 300 MG tablet Commonly known as:   ZYLOPRIM Take 0.5 tablets (150 mg total) by mouth daily.   amLODipine 5 MG tablet Commonly known as:  NORVASC Take 1 tablet (5 mg total) by mouth daily.   atorvastatin 20 MG tablet Commonly known as:  LIPITOR Take 1 tablet (20 mg total) by mouth at bedtime.   carvedilol 6.25 MG tablet Commonly known as:  COREG Take 6.25 mg by mouth 2 (two) times daily with a meal.   cholecalciferol 1000 units tablet Commonly known as:  VITAMIN D Take 1,000 Units by mouth daily.   CHROMIUM GTF PO Take 2 capsules by mouth every evening.   Cinnamon 500 MG Tabs Take 2 tablets by mouth at bedtime.   Co Q 10 100 MG Caps Take 100 mg by mouth daily.   dicyclomine 10 MG capsule Commonly known as:  BENTYL Take 1 capsule (10 mg total) by  mouth 4 (four) times daily -  before meals and at bedtime.   diphenoxylate-atropine 2.5-0.025 MG tablet Commonly known as:  LOMOTIL Take 1 tablet by mouth 3 (three) times daily as needed for diarrhea or loose stools.   ELIQUIS 2.5 MG Tabs tablet Generic drug:  apixaban TAKE 1 TABLET TWICE A DAY   finasteride 5 MG tablet Commonly known as:  PROSCAR Take 5 mg by mouth daily.   fish oil-omega-3 fatty acids 1000 MG capsule Take 1 g by mouth daily.   furosemide 20 MG tablet Commonly known as:  LASIX Take 1 tablet (20 mg total) by mouth as directed.   glucose blood test strip Commonly known as:  ACCU-CHEK GUIDE Check fasting blood sugar once daily   potassium chloride 10 MEQ tablet Commonly known as:  K-DUR Take 1 tablet (10 mEq total) by mouth daily.   PRESERVISION AREDS PO Take 1 tablet by mouth 2 (two) times daily.   PROBIOTIC PO Take 1 tablet by mouth daily.   tamsulosin 0.4 MG Caps capsule Commonly known as:  FLOMAX TAKE 1 CAPSULE BY MOUTH ONCE DAILY AFTER SUPPER   VANADYL SULFATE PO Take 1 capsule by mouth every evening.   zinc gluconate 50 MG tablet Take 50 mg by mouth daily.       No results found for this or any previous visit  (from the past 24 hour(s)). No results found.   ROS: Negative, with the exception of above mentioned in HPI  Objective:  BP (!) 130/56 (BP Location: Left Arm, Patient Position: Sitting, Cuff Size: Normal)   Pulse 71   Temp (!) 97.5 F (36.4 C) (Oral)   Ht 5\' 8"  (1.727 m)   Wt 159 lb (72.1 kg)   BMI 24.18 kg/m  Body mass index is 24.18 kg/m.  Gen: Afebrile. No acute distress. nontoxic HENT: AT. [place].  MMM.  Eyes:Pupils Equal Round Reactive to light, Extraocular movements intact,  Conjunctiva without redness, discharge or icterus. Neck/lymp/endocrine: Supple,no lymphadenopathy, no thyromegaly CV: RRR no murmur, trace edema- right Le only Chest: CTAB, no wheeze or crackles Abd: Soft. NTND. BS present.  Neuro: Normal gait. PERLA. EOMi. Alert. Oriented.  Psych: Normal affect, dress and demeanor. Normal speech. Normal thought content and judgment.  Assessment/Plan: LIMMIE SCHOENBERG is a 85 y.o. male present for OV for  Essential hypertension/Paroxysmal atrial fibrillation (HCC)/Stage 3 chronic kidney disease/HLD//CHF/CAD/PVD - better today on recheck. His edema is almost completely resolved, weight down 2.2 lbs and BP better. Fluid believed from holidays and vanc use. He feels better. - Continue amlodipine 5 mg daily, Lipitor 20 mg daily. -  Lasix 20 mg daily for next 1-2 days scheduled and then QD PRN depending upon weight and fluid ( 3lb & fluid rule).  - continue  fish oil supplementation, Eliquis 2.5 mg twice daily, Coreg 6.25 twice daily.   - continue potassium 1 tab daily, 2 tabs Qd if using lasix that day.  - However he also has difficulty controlling potassium on ace/arb- so lisinopril was dc'd in the past 2/2 to hyperkalemia - now he is on a potasium supplement daily to maintain levels. - Low-sodium diet.  -  He is very difficult to control and his hydration and IDA/epo injections cause fluctuations leading to both highs and lower than desired readings. Certainly would rather  mildly elevated BP over too low.  - Follow-up 6 months, as long as seeing Dr. Johnsie Cancel routinely as well.   electronically signed by:  Howard Pouch, DO  [facility] - OR

## 2018-05-14 ENCOUNTER — Encounter: Payer: Self-pay | Admitting: Hematology & Oncology

## 2018-05-14 ENCOUNTER — Other Ambulatory Visit: Payer: Self-pay

## 2018-05-14 ENCOUNTER — Inpatient Hospital Stay: Payer: Medicare Other

## 2018-05-14 ENCOUNTER — Inpatient Hospital Stay (HOSPITAL_BASED_OUTPATIENT_CLINIC_OR_DEPARTMENT_OTHER): Payer: Medicare Other | Admitting: Hematology & Oncology

## 2018-05-14 ENCOUNTER — Inpatient Hospital Stay: Payer: Medicare Other | Attending: Hematology & Oncology

## 2018-05-14 VITALS — BP 170/72 | HR 78 | Temp 97.8°F | Resp 20 | Wt 158.0 lb

## 2018-05-14 DIAGNOSIS — D631 Anemia in chronic kidney disease: Secondary | ICD-10-CM

## 2018-05-14 DIAGNOSIS — N189 Chronic kidney disease, unspecified: Secondary | ICD-10-CM | POA: Diagnosis not present

## 2018-05-14 DIAGNOSIS — E611 Iron deficiency: Secondary | ICD-10-CM | POA: Insufficient documentation

## 2018-05-14 DIAGNOSIS — D509 Iron deficiency anemia, unspecified: Secondary | ICD-10-CM

## 2018-05-14 DIAGNOSIS — D500 Iron deficiency anemia secondary to blood loss (chronic): Secondary | ICD-10-CM

## 2018-05-14 DIAGNOSIS — D508 Other iron deficiency anemias: Secondary | ICD-10-CM

## 2018-05-14 DIAGNOSIS — D649 Anemia, unspecified: Secondary | ICD-10-CM

## 2018-05-14 LAB — CBC WITH DIFFERENTIAL (CANCER CENTER ONLY)
Abs Immature Granulocytes: 0.04 10*3/uL (ref 0.00–0.07)
BASOS PCT: 0 %
Basophils Absolute: 0 10*3/uL (ref 0.0–0.1)
Eosinophils Absolute: 0.1 10*3/uL (ref 0.0–0.5)
Eosinophils Relative: 2 %
HCT: 31.8 % — ABNORMAL LOW (ref 39.0–52.0)
Hemoglobin: 10.3 g/dL — ABNORMAL LOW (ref 13.0–17.0)
Immature Granulocytes: 1 %
Lymphocytes Relative: 21 %
Lymphs Abs: 1.6 10*3/uL (ref 0.7–4.0)
MCH: 30 pg (ref 26.0–34.0)
MCHC: 32.4 g/dL (ref 30.0–36.0)
MCV: 92.7 fL (ref 80.0–100.0)
Monocytes Absolute: 0.7 10*3/uL (ref 0.1–1.0)
Monocytes Relative: 8 %
Neutro Abs: 5.3 10*3/uL (ref 1.7–7.7)
Neutrophils Relative %: 68 %
PLATELETS: 201 10*3/uL (ref 150–400)
RBC: 3.43 MIL/uL — ABNORMAL LOW (ref 4.22–5.81)
RDW: 14.4 % (ref 11.5–15.5)
WBC Count: 7.8 10*3/uL (ref 4.0–10.5)
nRBC: 0 % (ref 0.0–0.2)

## 2018-05-14 LAB — CMP (CANCER CENTER ONLY)
ALT: 23 U/L (ref 0–44)
AST: 19 U/L (ref 15–41)
Albumin: 4.2 g/dL (ref 3.5–5.0)
Alkaline Phosphatase: 81 U/L (ref 38–126)
Anion gap: 9 (ref 5–15)
BUN: 18 mg/dL (ref 8–23)
CO2: 26 mmol/L (ref 22–32)
Calcium: 9.1 mg/dL (ref 8.9–10.3)
Chloride: 106 mmol/L (ref 98–111)
Creatinine: 1.51 mg/dL — ABNORMAL HIGH (ref 0.61–1.24)
GFR, Est AFR Am: 47 mL/min — ABNORMAL LOW (ref >60)
GFR, Estimated: 41 mL/min — ABNORMAL LOW (ref >60)
Glucose, Bld: 163 mg/dL — ABNORMAL HIGH (ref 70–99)
Potassium: 4.4 mmol/L (ref 3.5–5.1)
Sodium: 141 mmol/L (ref 135–145)
Total Bilirubin: 0.5 mg/dL (ref 0.3–1.2)
Total Protein: 6.9 g/dL (ref 6.5–8.1)

## 2018-05-14 MED ORDER — GLUCOSE BLOOD VI STRP: ORAL_STRIP | 3 refills | Status: DC

## 2018-05-14 MED ORDER — DARBEPOETIN ALFA 300 MCG/0.6ML IJ SOSY
PREFILLED_SYRINGE | INTRAMUSCULAR | Status: AC
  Filled 2018-05-14: qty 0.6

## 2018-05-14 MED ORDER — DARBEPOETIN ALFA 300 MCG/0.6ML IJ SOSY
300.0000 ug | PREFILLED_SYRINGE | Freq: Once | INTRAMUSCULAR | Status: AC
  Administered 2018-05-14: 300 ug via SUBCUTANEOUS

## 2018-05-14 NOTE — Telephone Encounter (Signed)
Pt requesting a rf on his diabetic testing strips. Pt is up to date on FU appt will refill rx request.

## 2018-05-14 NOTE — Patient Instructions (Signed)

## 2018-05-14 NOTE — Progress Notes (Signed)
Hematology and Oncology Follow Up Visit  [name] [id number] October 28, 1928 85 y.o. 05/14/2018   Principle Diagnosis:  Erythropoietin deficiency anemia Iron deficiency anemia  Current Therapy:   IV iron as indicated Aranesp 300 g subcutaneous as needed for hemoglobin less than 11 - last dose given on  09/17/2016   Interim History:  [name] is here today for follow-up.  He is looking quite good.  I see him in church all the time.  He is part of our choir.  Apparently, he has some issues with billing.  He was billed $6000 for his last Aranesp.  I am not sure how this happened.  He has never been billed before.  He had a good holiday season.  He had no problems over Christmas and New Year's.  Again, I see him in church as he is a member of our church choir.  He has had no bleeding.  There is been no nausea or vomiting.  He has had no rashes.  He has had no leg swelling.  He does have mild renal insufficiency.  We are watching his renal function closely.    Overall, his performance status is ECOG 2.  Medications:  Allergies as of 05/14/2018      Reactions   Penicillins Hives   Has patient had a PCN reaction causing immediate rash, facial/tongue/throat swelling, SOB or lightheadedness with hypotension: No Has patient had a PCN reaction causing severe rash involving mucus membranes or skin necrosis: Yes Has patient had a PCN reaction that required hospitalization: No Has patient had a PCN reaction occurring within the last 10 years: No If all of the above answers are "NO", then may proceed with Cephalosporin use.   Vancomycin Other (See Comments)   Edema and myalgia      Medication List       Accurate as of May 14, 2018  1:05 PM. Always use your most recent med list.        ACCU-CHEK FASTCLIX LANCETS Misc Check fasting blood sugar once daily   allopurinol 300 MG tablet Commonly known as:  ZYLOPRIM Take 0.5 tablets (150 mg total) by mouth daily.     amLODipine 5 MG tablet Commonly known as:  NORVASC Take 1 tablet (5 mg total) by mouth daily.   atorvastatin 20 MG tablet Commonly known as:  LIPITOR Take 1 tablet (20 mg total) by mouth at bedtime.   carvedilol 6.25 MG tablet Commonly known as:  COREG Take 1 tablet (6.25 mg total) by mouth 2 (two) times daily with a meal.   cholecalciferol 1000 units tablet Commonly known as:  VITAMIN D Take 1,000 Units by mouth daily.   CHROMIUM GTF PO Take 1 capsule by mouth every evening.   Cinnamon 500 MG Tabs Take 2 tablets by mouth at bedtime.   Co Q 10 100 MG Caps Take 100 mg by mouth daily.   dicyclomine 10 MG capsule Commonly known as:  BENTYL Take 1 capsule (10 mg total) by mouth 4 (four) times daily -  before meals and at bedtime.   ELIQUIS 2.5 MG Tabs tablet Generic drug:  apixaban TAKE 1 TABLET TWICE A DAY   finasteride 5 MG tablet Commonly known as:  PROSCAR Take 5 mg by mouth daily.   fish oil-omega-3 fatty acids 1000 MG capsule Take 1 g by mouth daily.   furosemide 20 MG tablet Commonly known as:  LASIX Take 1 tablet (20 mg total) by mouth as directed.   glucose blood  test strip Commonly known as:  ACCU-CHEK GUIDE Check fasting blood sugar once daily   potassium chloride 10 MEQ tablet Commonly known as:  K-DUR Take 1 tablet (10 mEq total) by mouth daily.   PRESERVISION AREDS PO Take 1 tablet by mouth 2 (two) times daily.   PROBIOTIC PO Take 1 tablet by mouth daily.   tamsulosin 0.4 MG Caps capsule Commonly known as:  FLOMAX TAKE 1 CAPSULE BY MOUTH ONCE DAILY AFTER SUPPER   VANADYL SULFATE PO Take 1 capsule by mouth every evening.   zinc gluconate 50 MG tablet Take 50 mg by mouth daily.       Allergies:  Allergies  Allergen Reactions  . Penicillins Hives    Has patient had a PCN reaction causing immediate rash, facial/tongue/throat swelling, SOB or lightheadedness with hypotension: No Has patient had a PCN reaction causing severe rash  involving mucus membranes or skin necrosis: Yes Has patient had a PCN reaction that required hospitalization: No Has patient had a PCN reaction occurring within the last 10 years: No If all of the above answers are "NO", then may proceed with Cephalosporin use.   . Vancomycin Other (See Comments)    Edema and myalgia    Past Medical History, Surgical history, Social history, and Family History were reviewed and updated.  Review of Systems: Review of Systems  Constitutional: Negative.   HENT: Negative.   Eyes: Negative.   Respiratory: Negative.   Cardiovascular: Positive for palpitations.  Gastrointestinal: Negative.   Genitourinary: Negative.   Musculoskeletal: Negative.   Skin: Negative.   Neurological: Negative.   Endo/Heme/Allergies: Negative.   Psychiatric/Behavioral: Negative.      Physical Exam:  weight is 158 lb (71.7 kg). His oral temperature is 97.8 F (36.6 C). His blood pressure is 170/72 (abnormal) and his pulse is 78. His respiration is 20 and oxygen saturation is 100%.   Wt Readings from Last 3 Encounters:  05/14/18 158 lb (71.7 kg)  05/13/18 159 lb (72.1 kg)  05/04/18 161 lb 3.2 oz (73.1 kg)    Physical Exam Vitals signs reviewed.  HENT:     Head: Normocephalic and atraumatic.  Eyes:     Pupils: Pupils are equal, round, and reactive to light.  Neck:     Musculoskeletal: Normal range of motion.  Cardiovascular:     Rate and Rhythm: Normal rate and regular rhythm.     Heart sounds: Normal heart sounds.  Pulmonary:     Effort: Pulmonary effort is normal.     Breath sounds: Normal breath sounds.  Abdominal:     General: Bowel sounds are normal.     Palpations: Abdomen is soft.  Musculoskeletal: Normal range of motion.        General: No tenderness or deformity.  Lymphadenopathy:     Cervical: No cervical adenopathy.  Skin:    General: Skin is warm and dry.     Findings: No erythema or rash.  Neurological:     Mental Status: He is alert and  oriented to person, place, and time.  Psychiatric:        Behavior: Behavior normal.        Thought Content: Thought content normal.        Judgment: Judgment normal.      Lab Results  Component Value Date   WBC 7.8 05/14/2018   HGB 10.3 (L) 05/14/2018   HCT 31.8 (L) 05/14/2018   MCV 92.7 05/14/2018   PLT 201 05/14/2018   Lab Results  Component Value Date   FERRITIN 528 (H) 02/11/2018   IRON 95 02/11/2018   TIBC 248 02/11/2018   UIBC 153 02/11/2018   IRONPCTSAT 38 (L) 02/11/2018   Lab Results  Component Value Date   RETICCTPCT 2.4 02/11/2018   RBC 3.43 (L) 05/14/2018   No results found for: KPAFRELGTCHN, LAMBDASER, KAPLAMBRATIO No results found for: Kandis Cocking, IGMSERUM Lab Results  Component Value Date   TOTALPROTELP 6.0 (L) 12/26/2015   ALBUMINELP 3.4 (L) 12/26/2015   A1GS 0.4 (H) 12/26/2015   A2GS 0.8 12/26/2015   BETS 0.4 12/26/2015   BETA2SER 0.3 12/26/2015   GAMS 0.8 12/26/2015   MSPIKE Not Observed 01/29/2016   SPEI SEE NOTE 12/26/2015     Chemistry      Component Value Date/Time   NA 141 05/14/2018 1202   NA 141 08/11/2017 0924   NA 145 04/09/2017 0905   NA 140 03/28/2016 0853   K 4.4 05/14/2018 1202   K 4.5 04/09/2017 0905   K 4.4 03/28/2016 0853   CL 106 05/14/2018 1202   CL 105 04/09/2017 0905   CO2 26 05/14/2018 1202   CO2 27 04/09/2017 0905   CO2 19 (L) 03/28/2016 0853   BUN 18 05/14/2018 1202   BUN 27 08/11/2017 0924   BUN 33 (H) 04/09/2017 0905   BUN 34.4 (H) 03/28/2016 0853   CREATININE 1.51 (H) 05/14/2018 1202   CREATININE 1.72 (H) 08/18/2017 1543   CREATININE 1.7 (H) 03/28/2016 0853   GLU 113 07/24/2016      Component Value Date/Time   CALCIUM 9.1 05/14/2018 1202   CALCIUM 9.2 04/09/2017 0905   CALCIUM 9.2 03/28/2016 0853   ALKPHOS 81 05/14/2018 1202   ALKPHOS 69 04/09/2017 0905   ALKPHOS 85 03/28/2016 0853   AST 19 05/14/2018 1202   AST 23 03/28/2016 0853   ALT 23 05/14/2018 1202   ALT 27 04/09/2017 0905   ALT 37  03/28/2016 0853   BILITOT 0.5 05/14/2018 1202   BILITOT 0.79 03/28/2016 0853      Impression and Plan: Ms. Insley is a very pleasant 85 yo caucasian gentleman with both erythropoietin deficiency and iron deficiency anemia.   We will go ahead and give him a dose of Aranesp today.  I think this will be necessary.  I think this will help him.  I will give him a little bit more energy.  Hopefully, the issue with billing will be taken care of.  He needs Aranesp every 2 months.  As such, we will get him back to see [place] in 2 months.   [name], MD 1/17/20201:05 PM

## 2018-05-17 LAB — IRON AND TIBC
Iron: 42 ug/dL (ref 42–163)
Saturation Ratios: 18 % — ABNORMAL LOW (ref 20–55)
TIBC: 230 ug/dL (ref 202–409)
UIBC: 188 ug/dL (ref 117–376)

## 2018-05-17 LAB — FERRITIN: Ferritin: 461 ng/mL — ABNORMAL HIGH (ref 24–336)

## 2018-05-25 ENCOUNTER — Ambulatory Visit (INDEPENDENT_AMBULATORY_CARE_PROVIDER_SITE_OTHER): Payer: Medicare Other | Admitting: Family Medicine

## 2018-05-25 ENCOUNTER — Ambulatory Visit (HOSPITAL_BASED_OUTPATIENT_CLINIC_OR_DEPARTMENT_OTHER)
Admission: RE | Admit: 2018-05-25 | Discharge: 2018-05-25 | Disposition: A | Discharge status: Home / Self Care | Payer: Medicare Other | Source: Ambulatory Visit | Attending: Family Medicine | Admitting: Family Medicine

## 2018-05-25 ENCOUNTER — Encounter: Payer: Self-pay | Admitting: Family Medicine

## 2018-05-25 VITALS — BP 148/65 | HR 71 | Temp 97.6°F | Resp 16 | Ht 68.0 in | Wt 152.4 lb

## 2018-05-25 DIAGNOSIS — I25810 Atherosclerosis of coronary artery bypass graft(s) without angina pectoris: Secondary | ICD-10-CM

## 2018-05-25 DIAGNOSIS — M79605 Pain in left leg: Secondary | ICD-10-CM | POA: Diagnosis not present

## 2018-05-25 DIAGNOSIS — M79652 Pain in left thigh: Secondary | ICD-10-CM

## 2018-05-25 DIAGNOSIS — M791 Myalgia, unspecified site: Secondary | ICD-10-CM | POA: Diagnosis not present

## 2018-05-25 DIAGNOSIS — T50905A Adverse effect of unspecified drugs, medicaments and biological substances, initial encounter: Secondary | ICD-10-CM | POA: Diagnosis not present

## 2018-05-25 LAB — CBC WITH DIFFERENTIAL/PLATELET
Basophils Absolute: 0 10*3/uL (ref 0.0–0.1)
Basophils Relative: 0.8 % (ref 0.0–3.0)
Eosinophils Absolute: 0.2 10*3/uL (ref 0.0–0.7)
Eosinophils Relative: 2.8 % (ref 0.0–5.0)
HCT: 37.4 % — ABNORMAL LOW (ref 39.0–52.0)
Hemoglobin: 12.2 g/dL — ABNORMAL LOW (ref 13.0–17.0)
Lymphocytes Relative: 22.3 % (ref 12.0–46.0)
Lymphs Abs: 1.3 10*3/uL (ref 0.7–4.0)
MCHC: 32.5 g/dL (ref 30.0–36.0)
MCV: 93.3 fl (ref 78.0–100.0)
MONOS PCT: 9.9 % (ref 3.0–12.0)
Monocytes Absolute: 0.6 10*3/uL (ref 0.1–1.0)
Neutro Abs: 3.7 10*3/uL (ref 1.4–7.7)
Neutrophils Relative %: 64.2 % (ref 43.0–77.0)
Platelets: 214 10*3/uL (ref 150.0–400.0)
RBC: 4.01 Mil/uL — ABNORMAL LOW (ref 4.22–5.81)
RDW: 17.6 % — AB (ref 11.5–15.5)
WBC: 5.8 10*3/uL (ref 4.0–10.5)

## 2018-05-25 LAB — CK: Total CK: 42 U/L (ref 7–232)

## 2018-05-25 NOTE — Progress Notes (Signed)
OFFICE VISIT  05/25/2018   CC:  Chief Complaint  Patient presents with  . Leg Pain    and swelling, upper left leg   HPI:    Patient is a 85 y.o. Caucasian male who presents for painful left leg. Left thigh, specifically-->no preceding trauma/injury.  HPI:  Pt was put on oral vancomycin by GIM MD for c diff diarrhea recently (about 6 wks ago): took it for 4 days and got bilat thigh pains.  Vanc d/c'd and flagyl started (which he finished out) and the pain in R thigh resolved completely, but L thigh pain remains, primarily with wt bearing/walking.  Went walking around grocery store yesterday and when finished he could hardly walk anymore.  When sitting it is just a dull ache that is mild.  When he flexes his L hip the L thigh hurts.  No redness.  No swelling.  No fevers, fatigue, or malaise. Tylenol helps moderately well for 3 hours or so.  Of note, his diarrhea is gone and his bm's are back to his baseline "soft stool". No abd pain.  Appetite is good.  Past Medical History:  Diagnosis Date  . A-fib ([place])   . Basal cell carcinoma    skin  . Bigeminal rhythm   . Cataract   . Chronic renal insufficiency, stage 3 (moderate) (HCC) 2018   GFR 30s-40s  . Coronary artery disease    post bypass  . CVD (cardiovascular disease)   . Diabetes mellitus without complication ([place])   . Diverticular disease   . Easy bruising   . 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 Gout   . Hernia   . Hyperlipidemia   . Hypertension   . IBS (irritable bowel syndrome)   . Iron deficiency anemia 02/04/2016  . Macular degeneration   . Microscopic colitis   . PVD (peripheral vascular disease) ([place])     Past Surgical History:  Procedure Laterality Date  . CARDIAC CATHETERIZATION  2006  . CARDIOVERSION N/A 02/22/2013   Procedure: CARDIOVERSION;  Surgeon: [name], MD;  Location: [facility] ENDOSCOPY;  Service: Cardiovascular;  Laterality: N/A;  . CARDIOVERSION N/A 11/15/2014   Procedure:  CARDIOVERSION;  Surgeon: [name], MD;  Location: [facility] ENDOSCOPY;  Service: Cardiovascular;  Laterality: N/A;  . CARDIOVERSION N/A 04/01/2017   Procedure: CARDIOVERSION;  Surgeon: Larey Dresser, MD;  Location: [place];  Service: Cardiovascular;  Laterality: N/A;  . CAROTID ENDARTERECTOMY  2009/ 1993   left/ right  . COLONOSCOPY  03/17/2005   The colon is normal.  . CORONARY ARTERY BYPASS GRAFT  1999  . ESOPHAGOGASTRODUODENOSCOPY  03/17/2005   Normal esophagus. Normal Stomanch. Normal duodenum.   Marland Kitchen EYE SURGERY     eyelid repair  . INGUINAL HERNIA REPAIR  02/11/2012   Procedure: LAPAROSCOPIC BILATERAL INGUINAL HERNIA REPAIR;  Surgeon: Pedro Earls, MD;  Location: [facility] ORS;  Service: General;  Laterality: Bilateral;  . PACEMAKER IMPLANT N/A 07/28/2017   St Jude Medical Assurity MRI conditional  dual-chamber pacemaker for symptomatic second degree AV block by Dr Rayann Heman  . SKIN CANCER EXCISION     right ear x 3    Outpatient Medications Prior to Visit  Medication Sig Dispense Refill  . ACCU-CHEK FASTCLIX LANCETS MISC Check fasting blood sugar once daily 100 each 3  . allopurinol (ZYLOPRIM) 300 MG tablet Take 0.5 tablets (150 mg total) by mouth daily. 45 tablet 3  . amLODipine (NORVASC) 5 MG tablet Take 1 tablet (5 mg total) by mouth  daily. 90 tablet 1  . atorvastatin (LIPITOR) 20 MG tablet Take 1 tablet (20 mg total) by mouth at bedtime. 90 tablet 3  . carvedilol (COREG) 6.25 MG tablet Take 1 tablet (6.25 mg total) by mouth 2 (two) times daily with a meal. 180 tablet 1  . cholecalciferol (VITAMIN D) 1000 units tablet Take 1,000 Units by mouth daily.     . CHROMIUM GTF PO Take 1 capsule by mouth every evening.     . Cinnamon 500 MG TABS Take 2 tablets by mouth at bedtime.     . Coenzyme Q10 (CO Q 10) 100 MG CAPS Take 100 mg by mouth daily.     Marland Kitchen dicyclomine (BENTYL) 10 MG capsule Take 1 capsule (10 mg total) by mouth 4 (four) times daily -  before meals and at bedtime. (Patient  taking differently: Take 10 mg by mouth daily. ) 360 capsule 3  . ELIQUIS 2.5 MG TABS tablet TAKE 1 TABLET TWICE A DAY 180 tablet 2  . finasteride (PROSCAR) 5 MG tablet Take 5 mg by mouth daily.  3  . fish oil-omega-3 fatty acids 1000 MG capsule Take 1 g by mouth daily.     . furosemide (LASIX) 20 MG tablet Take 1 tablet (20 mg total) by mouth as directed. 90 tablet 3  . glucose blood (ACCU-CHEK GUIDE) test strip Check fasting blood sugar once daily 100 each 3  . Multiple Vitamins-Minerals (PRESERVISION AREDS PO) Take 1 tablet by mouth 2 (two) times daily.     . potassium chloride (K-DUR) 10 MEQ tablet Take 1 tablet (10 mEq total) by mouth daily. (Patient taking differently: Take 10 mEq by mouth 2 (two) times daily. ) 90 tablet 3  . Probiotic Product (PROBIOTIC PO) Take 1 tablet by mouth daily.    . tamsulosin (FLOMAX) 0.4 MG CAPS capsule TAKE 1 CAPSULE BY MOUTH ONCE DAILY AFTER SUPPER (Patient taking differently: TAKE 2 CAPSULES BY MOUTH ONCE DAILY AFTER SUPPER) 90 capsule 3  . VANADYL SULFATE PO Take 1 capsule by mouth every evening.     . zinc gluconate 50 MG tablet Take 50 mg by mouth daily.      No facility-administered medications prior to visit.     Allergies  Allergen Reactions  . Penicillins Hives    Has patient had a PCN reaction causing immediate rash, facial/tongue/throat swelling, SOB or lightheadedness with hypotension: No Has patient had a PCN reaction causing severe rash involving mucus membranes or skin necrosis: Yes Has patient had a PCN reaction that required hospitalization: No Has patient had a PCN reaction occurring within the last 10 years: No If all of the above answers are "NO", then may proceed with Cephalosporin use.   . Vancomycin Other (See Comments)    Edema and myalgia    ROS As per HPI  PE: Blood pressure (!) 148/65, pulse 71, temperature 97.6 F (36.4 C), temperature source Oral, resp. rate 16, height 5\' 8"  (1.727 m), weight 152 lb 6 oz (69.1 kg),  SpO2 98 %. Gen: Alert, well appearing.  Patient is oriented to person, place, time, and situation. AFFECT: pleasant, lucid thought and speech. No thigh swelling either side, no erythema.  Thighs appear symmetric.  Mild TTP in distal 1/2 of L quad. This pain is brought on by hip flexion and by knee flexion.     LABS:    Chemistry      Component Value Date/Time   NA 141 05/14/2018 1202   NA 141 08/11/2017 4315  NA 145 04/09/2017 0905   NA 140 03/28/2016 0853   K 4.4 05/14/2018 1202   K 4.5 04/09/2017 0905   K 4.4 03/28/2016 0853   CL 106 05/14/2018 1202   CL 105 04/09/2017 0905   CO2 26 05/14/2018 1202   CO2 27 04/09/2017 0905   CO2 19 (L) 03/28/2016 0853   BUN 18 05/14/2018 1202   BUN 27 08/11/2017 0924   BUN 33 (H) 04/09/2017 0905   BUN 34.4 (H) 03/28/2016 0853   CREATININE 1.51 (H) 05/14/2018 1202   CREATININE 1.72 (H) 08/18/2017 1543   CREATININE 1.7 (H) 03/28/2016 0853   GLU 113 07/24/2016      Component Value Date/Time   CALCIUM 9.1 05/14/2018 1202   CALCIUM 9.2 04/09/2017 0905   CALCIUM 9.2 03/28/2016 0853   ALKPHOS 81 05/14/2018 1202   ALKPHOS 69 04/09/2017 0905   ALKPHOS 85 03/28/2016 0853   AST 19 05/14/2018 1202   AST 23 03/28/2016 0853   ALT 23 05/14/2018 1202   ALT 27 04/09/2017 0905   ALT 37 03/28/2016 0853   BILITOT 0.5 05/14/2018 1202   BILITOT 0.79 03/28/2016 0853     Lab Results  Component Value Date   INR 1.2 (H) 11/13/2014   INR 1.1 (H) 01/03/2013   INR 0.96 06/15/2011   Lab Results  Component Value Date   WBC 7.8 05/14/2018   HGB 10.3 (L) 05/14/2018   HCT 31.8 (L) 05/14/2018   MCV 92.7 05/14/2018   PLT 201 05/14/2018   Lab Results  Component Value Date   HGBA1C 6.4 05/04/2018    IMPRESSION AND PLAN:  Left thigh pain; unknown etiology.  I would think it is a lingering side effect from the vanco, but it has been several weeks AND his right leg quickly returned back to normal. On exam he seems to have more distal femur tenderness  than anything else, not as much soft tissue tenderness as I was expecting.   Plan: check CBC w/diff, CPK total, and plain film of L femur. Continue tylenol q6h prn pain.  Spent 25 min with pt today, with >50% of this time spent in counseling and care coordination regarding the above problems.  An After Visit Summary was printed and given to the patient.  FOLLOW UP: Return in about 2 weeks (around 06/08/2018) for f/u L thigh pain with Dr. Raoul Pitch (pt's PCP).  Signed:  Crissie Sickles, MD           05/25/2018

## 2018-05-27 ENCOUNTER — Telehealth: Payer: Self-pay | Admitting: *Deleted

## 2018-05-27 DIAGNOSIS — L820 Inflamed seborrheic keratosis: Secondary | ICD-10-CM | POA: Diagnosis not present

## 2018-05-27 DIAGNOSIS — Z85828 Personal history of other malignant neoplasm of skin: Secondary | ICD-10-CM | POA: Diagnosis not present

## 2018-05-27 DIAGNOSIS — L570 Actinic keratosis: Secondary | ICD-10-CM | POA: Diagnosis not present

## 2018-05-27 DIAGNOSIS — L821 Other seborrheic keratosis: Secondary | ICD-10-CM | POA: Diagnosis not present

## 2018-05-27 NOTE — Telephone Encounter (Signed)
Spoke with patient regarding persistent AF since ~05/19/18. AT/AF burden increased to 22% since 02/10/18.  Patient denies ShOB, chest discomfort, or awareness of AF. He did note that his daily weight was up 8lbs total over the past couple of weeks. Started taking furosemide 20mg  daily and potassium 36mEq daily (takes additional tab when taking furosemide), down 7lbs total as of yesterday. Feels good right now.  Advised I will route to Dr. Rayann Heman for review and recommendations. Patient verbalizes understanding and thanked me for my call.    Presenting rhythm as of 05/17/18 at 04:00:

## 2018-05-28 DIAGNOSIS — H40023 Open angle with borderline findings, high risk, bilateral: Secondary | ICD-10-CM | POA: Diagnosis not present

## 2018-05-28 NOTE — Telephone Encounter (Signed)
He appears to be in an atrial flutter.  It may be possible to terminate with atrial pacing.  Please confirm that he has not missed any doses of eliquis and then put him on my schedule at the next available time.  Thompson Grayer MD, [facility] 05/28/2018 4:21 PM

## 2018-06-02 NOTE — Telephone Encounter (Signed)
Spoke with pt, pt agreeable to apt at 2:45 on 06/21/2018, pt reported compliance with Eliquis. Informed pt that it was very important not to miss any doses of the Eliquis between now and the apt scheduled with Dr. Rayann Heman pt voiced understanding

## 2018-06-04 ENCOUNTER — Encounter: Payer: Self-pay | Admitting: *Deleted

## 2018-06-08 ENCOUNTER — Encounter: Payer: Self-pay | Admitting: Family Medicine

## 2018-06-08 ENCOUNTER — Ambulatory Visit (INDEPENDENT_AMBULATORY_CARE_PROVIDER_SITE_OTHER): Payer: Medicare Other | Admitting: Family Medicine

## 2018-06-08 VITALS — BP 128/64 | HR 69 | Temp 97.4°F | Resp 16 | Ht 68.0 in | Wt 153.1 lb

## 2018-06-08 DIAGNOSIS — M79605 Pain in left leg: Secondary | ICD-10-CM

## 2018-06-08 NOTE — Progress Notes (Signed)
[name] , 1928-04-30, 85 y.o., male MRN: [id number] Patient Care Team    Relationship Specialty Notifications Start End  [name], DO PCP - General Family Medicine  12/18/15   [name], MD PCP - Cardiology Cardiology  03/17/17   [name], MD Consulting Physician Ophthalmology  12/18/15   [name], MD Consulting Physician Dermatology  12/18/15   [name], MD Consulting Physician Cardiology  12/18/15   [name], MD Consulting Physician Ophthalmology  12/19/15   [name], MD Consulting Physician Oncology  09/30/16   [name], MD Attending Physician Nephrology  09/30/16   [name], MD Consulting Physician Dermatology  09/30/16     Chief Complaint  Patient presents with  . Leg Pain    Left thigh pain. Pt had abx given to him for a infection and has had leg pain since. Xray was completed with no findings per patient Pt states his leg is no longer bothering him and has no pain.       Subjective: Pt presents for follow-up of left thigh pain originally started after vancomycin treatment was initiated for C. difficile.  Pain was originally bilateral thighs.  Right thigh pain resolved after discontinuation of vancomycin.  Left thigh pain had remained.  He was seen by my partner Dr. Deberah Pelton secondary to continued left thigh pain.  Labs and x-ray was ordered, all unremarkable.  Patient reports the day after his x-ray, all of his pain resolved.   Depression screen [facility] 2/9 11/03/2017 09/30/2016 12/18/2015  Decreased Interest 0 0 0  Down, Depressed, Hopeless 0 0 0  PHQ - 2 Score 0 0 0  Some recent data might be hidden    Allergies  Allergen Reactions  . Penicillins Hives    Has patient had a PCN reaction causing immediate rash, facial/tongue/throat swelling, SOB or lightheadedness with hypotension: No Has patient had a PCN reaction causing severe rash involving mucus membranes or skin necrosis: Yes Has patient had a PCN reaction that required  hospitalization: No Has patient had a PCN reaction occurring within the last 10 years: No If all of the above answers are "NO", then may proceed with Cephalosporin use.   . Vancomycin Other (See Comments)    Edema and myalgia   Social History   Social History Narrative   Married to [place], 2 children [name] and [name].   Some college. Retired from [employer].   Drinks caffeine, uses herbal remedies, takes a daily vitamin.   Wears his seatbelt, smoke detector at home, firearms in the home.   Wears a hearing aid.   Feels safe in her relationships.   Past Medical History:  Diagnosis Date  . A-fib ([place])   . Basal cell carcinoma    skin  . Bigeminal rhythm   . Cataract   . Chronic renal insufficiency, stage 3 (moderate) (HCC) 2018   GFR 30s-40s  . Coronary artery disease    post bypass  . CVD (cardiovascular disease)   . Diabetes mellitus without complication ([place])   . Diverticular disease   . Easy bruising   . 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 Gout   . Hernia   . Hyperlipidemia   . Hypertension   . IBS (irritable bowel syndrome)   . Iron deficiency anemia 02/04/2016  . Macular degeneration   . Microscopic colitis   . PVD (peripheral vascular disease) ([place])    Past Surgical History:  Procedure Laterality Date  .  CARDIAC CATHETERIZATION  2006  . CARDIOVERSION N/A 02/22/2013   Procedure: CARDIOVERSION;  Surgeon: [name], MD;  Location: [facility] ENDOSCOPY;  Service: Cardiovascular;  Laterality: N/A;  . CARDIOVERSION N/A 11/15/2014   Procedure: CARDIOVERSION;  Surgeon: [name], MD;  Location: [name] ENDOSCOPY;  Service: Cardiovascular;  Laterality: N/A;  . CARDIOVERSION N/A 04/01/2017   Procedure: CARDIOVERSION;  Surgeon: Larey Dresser, MD;  Location: [place];  Service: Cardiovascular;  Laterality: N/A;  . CAROTID ENDARTERECTOMY  2009/ 1993   left/ right  . COLONOSCOPY  03/17/2005   The colon is normal.  . CORONARY ARTERY BYPASS GRAFT  1999    . ESOPHAGOGASTRODUODENOSCOPY  03/17/2005   Normal esophagus. Normal Stomanch. Normal duodenum.   Marland Kitchen EYE SURGERY     eyelid repair  . INGUINAL HERNIA REPAIR  02/11/2012   Procedure: LAPAROSCOPIC BILATERAL INGUINAL HERNIA REPAIR;  Surgeon: Pedro Earls, MD;  Location: [facility] ORS;  Service: General;  Laterality: Bilateral;  . PACEMAKER IMPLANT N/A 07/28/2017   St Jude Medical Assurity MRI conditional  dual-chamber pacemaker for symptomatic second degree AV block by Dr Rayann Heman  . SKIN CANCER EXCISION     right ear x 3   Family History  Problem Relation Age of Onset  . Stroke Mother   . Coronary artery disease Father   . Heart disease Father   . Melanoma Sister   . Colon cancer Neg Hx    Allergies as of 06/08/2018      Reactions   Penicillins Hives   Has patient had a PCN reaction causing immediate rash, facial/tongue/throat swelling, SOB or lightheadedness with hypotension: No Has patient had a PCN reaction causing severe rash involving mucus membranes or skin necrosis: Yes Has patient had a PCN reaction that required hospitalization: No Has patient had a PCN reaction occurring within the last 10 years: No If all of the above answers are "NO", then may proceed with Cephalosporin use.   Vancomycin Other (See Comments)   Edema and myalgia      Medication List       Accurate as of June 08, 2018  7:11 PM. Always use your most recent med list.        ACCU-CHEK FASTCLIX LANCETS Misc Check fasting blood sugar once daily   allopurinol 300 MG tablet Commonly known as:  ZYLOPRIM Take 0.5 tablets (150 mg total) by mouth daily.   amLODipine 5 MG tablet Commonly known as:  NORVASC Take 1 tablet (5 mg total) by mouth daily.   atorvastatin 20 MG tablet Commonly known as:  LIPITOR Take 1 tablet (20 mg total) by mouth at bedtime.   carvedilol 6.25 MG tablet Commonly known as:  COREG Take 1 tablet (6.25 mg total) by mouth 2 (two) times daily with a meal.   cholecalciferol 1000  units tablet Commonly known as:  VITAMIN D Take 1,000 Units by mouth daily.   CHROMIUM GTF PO Take 1 capsule by mouth every evening.   Cinnamon 500 MG Tabs Take 2 tablets by mouth at bedtime.   Co Q 10 100 MG Caps Take 100 mg by mouth daily.   dicyclomine 10 MG capsule Commonly known as:  BENTYL Take 1 capsule (10 mg total) by mouth 4 (four) times daily -  before meals and at bedtime.   ELIQUIS 2.5 MG Tabs tablet Generic drug:  apixaban TAKE 1 TABLET TWICE A DAY   finasteride 5 MG tablet Commonly known as:  PROSCAR Take 5 mg by mouth daily.  fish oil-omega-3 fatty acids 1000 MG capsule Take 1 g by mouth daily.   furosemide 20 MG tablet Commonly known as:  LASIX Take 1 tablet (20 mg total) by mouth as directed.   glucose blood test strip Commonly known as:  ACCU-CHEK GUIDE Check fasting blood sugar once daily   potassium chloride 10 MEQ tablet Commonly known as:  K-DUR Take 1 tablet (10 mEq total) by mouth daily.   PRESERVISION AREDS PO Take 1 tablet by mouth 2 (two) times daily.   PROBIOTIC PO Take 1 tablet by mouth daily.   tamsulosin 0.4 MG Caps capsule Commonly known as:  FLOMAX TAKE 1 CAPSULE BY MOUTH ONCE DAILY AFTER SUPPER   VANADYL SULFATE PO Take 1 capsule by mouth every evening.   zinc gluconate 50 MG tablet Take 50 mg by mouth daily.       All past medical history, surgical history, allergies, family history, immunizations andmedications were updated in the EMR today and reviewed under the history and medication portions of their EMR.     ROS: Negative, with the exception of above mentioned in HPI   Objective:  BP 128/64 (BP Location: Left Arm, Patient Position: Sitting, Cuff Size: Normal)   Pulse 69   Temp (!) 97.4 F (36.3 C) (Oral)   Resp 16   Ht 5\' 8"  (1.727 m)   Wt 153 lb 2 oz (69.5 kg)   SpO2 99%   BMI 23.28 kg/m  Body mass index is 23.28 kg/m. Gen: Afebrile. No acute distress. Nontoxic in appearance, well developed, well  nourished.  MSK: No erythema of left thigh, no tenderness to palpation today of thigh, bony prominence or hip.  Neurovascular intact distally. Skin: No rashes, purpura or petechiae.  Neuro:  Normal gait. PERLA. EOMi. Alert. Oriented x3  No exam data present No results found. No results found for this or any previous visit (from the past 24 hour(s)).  Assessment/Plan: HAEDYN ANCRUM is a 85 y.o. male present for OV for  Pain of left lower extremity Uncertain etiology of pain.  Could be secondary to his vancomycin exposure.  Vancomycin has been added to his allergy list.  Reviewed all labs and x-ray today which were unremarkable.  Thankfully his pain has completely resolved and he is looking forward to restarting golf.   Reviewed expectations re: course of current medical issues.  Discussed self-management of symptoms.  Outlined signs and symptoms indicating need for more acute intervention.  Patient verbalized understanding and all questions were answered.  Patient received an After-Visit Summary.    No orders of the defined types were placed in this encounter.    Note is dictated utilizing voice recognition software. Although note has been proof read prior to signing, occasional typographical errors still can be missed. If any questions arise, please do not hesitate to call for verification.   electronically signed by:  Howard Pouch, DO  [place]

## 2018-06-08 NOTE — Patient Instructions (Addendum)
I am glad you are doing well.  If the pain returns, try tylenol for discomfort.   I am not sure if this was from the medication or not. I am glad it is gone in time for golf season.

## 2018-06-09 ENCOUNTER — Ambulatory Visit: Payer: Medicare Other

## 2018-06-16 ENCOUNTER — Other Ambulatory Visit: Payer: Self-pay | Admitting: Internal Medicine

## 2018-06-21 ENCOUNTER — Ambulatory Visit (INDEPENDENT_AMBULATORY_CARE_PROVIDER_SITE_OTHER): Payer: Medicare Other | Admitting: Internal Medicine

## 2018-06-21 ENCOUNTER — Encounter: Payer: Self-pay | Admitting: Internal Medicine

## 2018-06-21 VITALS — BP 162/70 | HR 72 | Ht 68.0 in | Wt 154.0 lb

## 2018-06-21 DIAGNOSIS — I484 Atypical atrial flutter: Secondary | ICD-10-CM

## 2018-06-21 DIAGNOSIS — I480 Paroxysmal atrial fibrillation: Secondary | ICD-10-CM

## 2018-06-21 DIAGNOSIS — I441 Atrioventricular block, second degree: Secondary | ICD-10-CM

## 2018-06-21 DIAGNOSIS — I4719 Other supraventricular tachycardia: Secondary | ICD-10-CM

## 2018-06-21 DIAGNOSIS — Z950 Presence of cardiac pacemaker: Secondary | ICD-10-CM

## 2018-06-21 DIAGNOSIS — I25810 Atherosclerosis of coronary artery bypass graft(s) without angina pectoris: Secondary | ICD-10-CM

## 2018-06-21 DIAGNOSIS — I471 Supraventricular tachycardia: Secondary | ICD-10-CM | POA: Diagnosis not present

## 2018-06-21 LAB — CUP PACEART INCLINIC DEVICE CHECK
Brady Statistic RA Percent Paced: 14 %
Brady Statistic RV Percent Paced: 95 %
Date Time Interrogation Session: 20200224183825
Implantable Lead Implant Date: 20190402
Implantable Lead Implant Date: 20190402
Implantable Lead Location: 753860
Implantable Pulse Generator Implant Date: 20190402
Lead Channel Impedance Value: 362.5 Ohm
Lead Channel Impedance Value: 487.5 Ohm
Lead Channel Pacing Threshold Amplitude: 0.5 V
Lead Channel Pacing Threshold Amplitude: 0.75 V
Lead Channel Pacing Threshold Pulse Width: 0.5 ms
Lead Channel Pacing Threshold Pulse Width: 0.5 ms
Lead Channel Sensing Intrinsic Amplitude: 2.2 mV
Lead Channel Setting Pacing Amplitude: 2 V
Lead Channel Setting Pacing Pulse Width: 0.5 ms
Lead Channel Setting Sensing Sensitivity: 2 mV
MDC IDC LEAD LOCATION: 753859
MDC IDC MSMT BATTERY REMAINING LONGEVITY: 100 mo
MDC IDC MSMT BATTERY VOLTAGE: 3.01 V
MDC IDC SET LEADCHNL RV PACING AMPLITUDE: 2.5 V
Pulse Gen Model: 2272
Pulse Gen Serial Number: 9003454

## 2018-06-21 NOTE — Progress Notes (Signed)
PCP: [name], DO Primary Cardiologist: William Johnsie Cancel Primary EP:  [name] is a 85 y.o. male who presents today for routine electrophysiology followup.  Since last being seen in our clinic, the patient reports doing very well. + swelling.  Otherwise asymptomatic with atrial flutter. Today, he denies symptoms of palpitations, chest pain, shortness of breath,  dizziness, presyncope, or syncope.  The patient is otherwise without complaint today.   Past Medical History:  Diagnosis Date  . A-fib ([place])   . Basal cell carcinoma    skin  . Bigeminal rhythm   . Cataract   . Chronic renal insufficiency, stage 3 (moderate) (HCC) 2018   GFR 30s-40s  . Coronary artery disease    post bypass  . CVD (cardiovascular disease)   . Diabetes mellitus without complication ([place])   . Diverticular disease   . Easy bruising   . 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 Gout   . Hernia   . Hyperlipidemia   . Hypertension   . IBS (irritable bowel syndrome)   . Iron deficiency anemia 02/04/2016  . Macular degeneration   . Microscopic colitis   . PVD (peripheral vascular disease) ([place])    Past Surgical History:  Procedure Laterality Date  . CARDIAC CATHETERIZATION  2006  . CARDIOVERSION N/A 02/22/2013   Procedure: CARDIOVERSION;  Surgeon: [name], MD;  Location: [facility] ENDOSCOPY;  Service: Cardiovascular;  Laterality: N/A;  . CARDIOVERSION N/A 11/15/2014   Procedure: CARDIOVERSION;  Surgeon: [name], MD;  Location: [facility] ENDOSCOPY;  Service: Cardiovascular;  Laterality: N/A;  . CARDIOVERSION N/A 04/01/2017   Procedure: CARDIOVERSION;  Surgeon: Larey Dresser, MD;  Location: [facility];  Service: Cardiovascular;  Laterality: N/A;  . CAROTID ENDARTERECTOMY  2009/ 1993   left/ right  . COLONOSCOPY  03/17/2005   The colon is normal.  . CORONARY ARTERY BYPASS GRAFT  1999  . ESOPHAGOGASTRODUODENOSCOPY  03/17/2005   Normal esophagus. Normal Stomanch. Normal  duodenum.   Marland Kitchen EYE SURGERY     eyelid repair  . INGUINAL HERNIA REPAIR  02/11/2012   Procedure: LAPAROSCOPIC BILATERAL INGUINAL HERNIA REPAIR;  Surgeon: Pedro Earls, MD;  Location: [facility] ORS;  Service: General;  Laterality: Bilateral;  . PACEMAKER IMPLANT N/A 07/28/2017   St Jude Medical Assurity MRI conditional  dual-chamber pacemaker for symptomatic second degree AV block by William Rayann Heman  . SKIN CANCER EXCISION     right ear x 3    ROS- all systems are reviewed and negative except as per HPI above  Current Outpatient Medications  Medication Sig Dispense Refill  . ACCU-CHEK FASTCLIX LANCETS MISC Check fasting blood sugar once daily 100 each 3  . allopurinol (ZYLOPRIM) 300 MG tablet Take 0.5 tablets (150 mg total) by mouth daily. 45 tablet 3  . amLODipine (NORVASC) 5 MG tablet Take 1 tablet (5 mg total) by mouth daily. 90 tablet 1  . atorvastatin (LIPITOR) 20 MG tablet Take 1 tablet (20 mg total) by mouth at bedtime. 90 tablet 3  . carvedilol (COREG) 6.25 MG tablet Take 1 tablet (6.25 mg total) by mouth 2 (two) times daily with a meal. 180 tablet 1  . cholecalciferol (VITAMIN D) 1000 units tablet Take 1,000 Units by mouth daily.     . CHROMIUM GTF PO Take 1 capsule by mouth every evening.     . Cinnamon 500 MG TABS Take 2 tablets by mouth at bedtime.     . Coenzyme Q10 (CO  Q 10) 100 MG CAPS Take 100 mg by mouth daily.     Marland Kitchen dicyclomine (BENTYL) 10 MG capsule Take 1 capsule (10 mg total) by mouth 4 (four) times daily -  before meals and at bedtime. (Patient taking differently: Take 10 mg by mouth daily. ) 360 capsule 3  . ELIQUIS 2.5 MG TABS tablet TAKE 1 TABLET TWICE A DAY 180 tablet 2  . finasteride (PROSCAR) 5 MG tablet Take 5 mg by mouth daily.  3  . fish oil-omega-3 fatty acids 1000 MG capsule Take 1 g by mouth daily.     . furosemide (LASIX) 20 MG tablet Take 1 tablet (20 mg total) by mouth as directed. 90 tablet 3  . glucose blood (ACCU-CHEK GUIDE) test strip Check fasting blood sugar  once daily 100 each 3  . Multiple Vitamins-Minerals (PRESERVISION AREDS PO) Take 1 tablet by mouth 2 (two) times daily.     . potassium chloride (K-DUR) 10 MEQ tablet Take 1 tablet (10 mEq total) by mouth daily. (Patient taking differently: Take 10 mEq by mouth 2 (two) times daily. ) 90 tablet 3  . Probiotic Product (PROBIOTIC PO) Take 1 tablet by mouth daily.    . tamsulosin (FLOMAX) 0.4 MG CAPS capsule TAKE 1 CAPSULE BY MOUTH ONCE DAILY AFTER SUPPER (Patient taking differently: TAKE 2 CAPSULES BY MOUTH ONCE DAILY AFTER SUPPER) 90 capsule 3  . VANADYL SULFATE PO Take 1 capsule by mouth every evening.     . zinc gluconate 50 MG tablet Take 50 mg by mouth daily.      No current facility-administered medications for this visit.     Physical Exam: Vitals:   06/21/18 1455  BP: (!) 162/70  Pulse: 72  SpO2: 98%  Weight: 154 lb (69.9 kg)  Height: 5\' 8"  (1.727 m)    GEN- The patient is well appearing, alert and oriented x 3 today.   Head- normocephalic, atraumatic Eyes-  Sclera clear, conjunctiva pink Ears- hearing intact Oropharynx- clear Lungs- Clear to ausculation bilaterally, normal work of breathing Chest- pacemaker pocket is well healed Heart- Regular rate and rhythm (paced) GI- soft, NT, ND, + BS Extremities- no clubbing, cyanosis, or edema  Pacemaker interrogation- reviewed in detail today,  See PACEART report  ekg tracing ordered today is personally reviewed and shows sinus with V pacing  Assessment and Plan:  1. Symptomatic complete heart block Normal pacemaker function See Pace Art report VIP turned off today  2. Atypical atrial flutter/ paroxysmal atrial fibrillation We brought him in today for sustained atrial flutter with worsening edema and weight gain.  He had converted to sinus on arrival to clinic but did go spontaneously back into atypical atrial flutter today.  We were able to terminate his atrial flutter with atrial pacing in the office today. On eliquis for  chads2vasc score of 5 asymptomatic Continue rate control long term.  3. HTN Stable No change required today  4. CAD No ischemic symptoms  Merlin Return to see EP NP in 4 months to assess atrial arrhythmias.  If asymptomatic, he may be best with rate control long term.  Thompson Grayer MD, [facility] 06/21/2018 3:19 PM

## 2018-06-21 NOTE — Patient Instructions (Signed)
Medication Instructions:  Your physician recommends that you continue on your current medications as directed. Please refer to the Current Medication list given to you today.  Labwork: None ordered.  Testing/Procedures: None ordered.  Follow-Up: Your physician wants you to follow-up in: 4 months with Chanetta Marshall, NP.   You will receive a reminder letter in the mail two months in advance. If you don't receive a letter, please call our office to schedule the follow-up appointment.  Remote monitoring is used to monitor your Pacemaker from home. This monitoring reduces the number of office visits required to check your device to one time per year. It allows [place] to keep an eye on the functioning of your device to ensure it is working properly. You are scheduled for a device check from home on 08/02/2018. You may send your transmission at any time that day. If you have a wireless device, the transmission will be sent automatically. After your physician reviews your transmission, you will receive a postcard with your next transmission date.  Any Other Special Instructions Will Be Listed Below (If Applicable).  If you need a refill on your cardiac medications before your next appointment, please call your pharmacy.

## 2018-06-28 ENCOUNTER — Telehealth: Payer: Self-pay | Admitting: Family Medicine

## 2018-06-28 DIAGNOSIS — E7800 Pure hypercholesterolemia, unspecified: Secondary | ICD-10-CM

## 2018-06-28 MED ORDER — ATORVASTATIN CALCIUM 20 MG PO TABS: 20.0000 mg | ORAL_TABLET | Freq: Every day | ORAL | 2 refills | Status: DC

## 2018-06-28 NOTE — Telephone Encounter (Signed)
Copied from [place] [id number]. Topic: General - Other >> Jun 28, 2018 11:05 AM Lennox Solders wrote: Reason for CRM: pt is calling and [facility]  [facility]atin 20 mg #[facility]

## 2018-06-28 NOTE — Telephone Encounter (Signed)
[facility] and pt verbalized understanding. Pt has enough to last until [facility]

## 2018-06-30 ENCOUNTER — Other Ambulatory Visit: Payer: Self-pay | Admitting: Cardiovascular Disease

## 2018-06-30 MED ORDER — AMLODIPINE BESYLATE 5 MG PO TABS: 5.0000 mg | ORAL_TABLET | Freq: Every day | ORAL | 0 refills | Status: DC

## 2018-06-30 MED ORDER — APIXABAN 2.5 MG PO TABS: 2.5000 mg | ORAL_TABLET | Freq: Two times a day (BID) | ORAL | 2 refills | Status: DC

## 2018-06-30 NOTE — Telephone Encounter (Signed)
Eliquis 2.5mg  refill request received; pt is 85 yrs old, wt-69.9kg, Crea-1.51 on 05/14/2018, last seen by Dr. Rayann Heman on 06/21/2018; will send in refill to requested pharmacy.

## 2018-06-30 NOTE — Telephone Encounter (Signed)
New Message    *STAT* If patient is at the pharmacy, call can be transferred to refill team.   1. Which medications need to be refilled? (please list name of each medication and dose if known) Amlodipine 5 mg   Eliquis 2.5 mg   2. Which pharmacy/location (including street and city if local pharmacy) is medication to be sent to[facility]  3. Do they need a 30 day or 90 day supply? 90 Days

## 2018-07-06 DIAGNOSIS — I739 Peripheral vascular disease, unspecified: Secondary | ICD-10-CM | POA: Diagnosis not present

## 2018-07-06 DIAGNOSIS — E1151 Type 2 diabetes mellitus with diabetic peripheral angiopathy without gangrene: Secondary | ICD-10-CM | POA: Diagnosis not present

## 2018-07-06 DIAGNOSIS — L603 Nail dystrophy: Secondary | ICD-10-CM | POA: Diagnosis not present

## 2018-07-06 DIAGNOSIS — L84 Corns and callosities: Secondary | ICD-10-CM | POA: Diagnosis not present

## 2018-07-08 ENCOUNTER — Inpatient Hospital Stay: Payer: Medicare Other

## 2018-07-08 ENCOUNTER — Other Ambulatory Visit: Payer: Self-pay

## 2018-07-08 ENCOUNTER — Inpatient Hospital Stay: Payer: Medicare Other | Attending: Hematology & Oncology | Admitting: Hematology & Oncology

## 2018-07-08 VITALS — BP 156/57 | HR 59 | Temp 97.8°F | Resp 17 | Wt 151.5 lb

## 2018-07-08 DIAGNOSIS — D509 Iron deficiency anemia, unspecified: Secondary | ICD-10-CM | POA: Diagnosis not present

## 2018-07-08 DIAGNOSIS — D500 Iron deficiency anemia secondary to blood loss (chronic): Secondary | ICD-10-CM

## 2018-07-08 DIAGNOSIS — D631 Anemia in chronic kidney disease: Secondary | ICD-10-CM | POA: Diagnosis not present

## 2018-07-08 DIAGNOSIS — N289 Disorder of kidney and ureter, unspecified: Secondary | ICD-10-CM | POA: Diagnosis not present

## 2018-07-08 DIAGNOSIS — E7800 Pure hypercholesterolemia, unspecified: Secondary | ICD-10-CM

## 2018-07-08 DIAGNOSIS — N189 Chronic kidney disease, unspecified: Secondary | ICD-10-CM

## 2018-07-08 LAB — RETICULOCYTES
IMMATURE RETIC FRACT: 11.6 % (ref 2.3–15.9)
RBC.: 3.79 MIL/uL — ABNORMAL LOW (ref 4.22–5.81)
Retic Count, Absolute: 53.8 10*3/uL (ref 19.0–186.0)
Retic Ct Pct: 1.4 % (ref 0.4–3.1)

## 2018-07-08 LAB — CBC WITH DIFFERENTIAL (CANCER CENTER ONLY)
Abs Immature Granulocytes: 0.02 10*3/uL (ref 0.00–0.07)
Basophils Absolute: 0 10*3/uL (ref 0.0–0.1)
Basophils Relative: 0 %
EOS PCT: 3 %
Eosinophils Absolute: 0.2 10*3/uL (ref 0.0–0.5)
HCT: 35.4 % — ABNORMAL LOW (ref 39.0–52.0)
HEMOGLOBIN: 11.5 g/dL — AB (ref 13.0–17.0)
Immature Granulocytes: 0 %
Lymphocytes Relative: 31 %
Lymphs Abs: 1.9 10*3/uL (ref 0.7–4.0)
MCH: 30.3 pg (ref 26.0–34.0)
MCHC: 32.5 g/dL (ref 30.0–36.0)
MCV: 93.4 fL (ref 80.0–100.0)
Monocytes Absolute: 0.5 10*3/uL (ref 0.1–1.0)
Monocytes Relative: 9 %
NRBC: 0 % (ref 0.0–0.2)
Neutro Abs: 3.6 10*3/uL (ref 1.7–7.7)
Neutrophils Relative %: 57 %
Platelet Count: 147 10*3/uL — ABNORMAL LOW (ref 150–400)
RBC: 3.79 MIL/uL — AB (ref 4.22–5.81)
RDW: 14.9 % (ref 11.5–15.5)
WBC Count: 6.2 10*3/uL (ref 4.0–10.5)

## 2018-07-08 LAB — CMP (CANCER CENTER ONLY)
ALT: 27 U/L (ref 0–44)
AST: 23 U/L (ref 15–41)
Albumin: 4.2 g/dL (ref 3.5–5.0)
Alkaline Phosphatase: 67 U/L (ref 38–126)
Anion gap: 8 (ref 5–15)
BUN: 35 mg/dL — ABNORMAL HIGH (ref 8–23)
CHLORIDE: 110 mmol/L (ref 98–111)
CO2: 21 mmol/L — ABNORMAL LOW (ref 22–32)
Calcium: 8.8 mg/dL — ABNORMAL LOW (ref 8.9–10.3)
Creatinine: 1.77 mg/dL — ABNORMAL HIGH (ref 0.61–1.24)
GFR, Est AFR Am: 39 mL/min — ABNORMAL LOW (ref >60)
GFR, Estimated: 34 mL/min — ABNORMAL LOW (ref >60)
Glucose, Bld: 143 mg/dL — ABNORMAL HIGH (ref 70–99)
POTASSIUM: 4.5 mmol/L (ref 3.5–5.1)
Sodium: 139 mmol/L (ref 135–145)
Total Bilirubin: 0.5 mg/dL (ref 0.3–1.2)
Total Protein: 6.8 g/dL (ref 6.5–8.1)

## 2018-07-08 MED ORDER — DARBEPOETIN ALFA 300 MCG/0.6ML IJ SOSY
PREFILLED_SYRINGE | INTRAMUSCULAR | Status: AC
  Filled 2018-07-08: qty 0.6

## 2018-07-08 NOTE — Progress Notes (Signed)
Hematology and Oncology Follow Up Visit  [name] [id number] 12-03-1928 85 y.o. 07/08/2018   Principle Diagnosis:  Erythropoietin deficiency anemia Iron deficiency anemia  Current Therapy:   IV iron as indicated Aranesp 300 g subcutaneous as needed for hemoglobin less than 11 - last dose given on  09/17/2016   Interim History:  [name] is here today for follow-up.  He is looking quite good.  I see him in church all the time.  He is part of our choir.  Thankfully, everything seems to worked out with respect to the billing issues that he had.  He has had no issues with bleeding.  He still has the renal insufficiency.  When we last saw him in January, his ferritin was 461 with an iron saturation of only 18%.  He got a dose of iron back then.  He has had no cough.  He has had no fever.  Thankfully, he is not been ravaged by the coronavirus.  Overall, his performance status is ECOG 2.  Medications:  Allergies as of 07/08/2018      Reactions   Penicillins Hives   Has patient had a PCN reaction causing immediate rash, facial/tongue/throat swelling, SOB or lightheadedness with hypotension: No Has patient had a PCN reaction causing severe rash involving mucus membranes or skin necrosis: Yes Has patient had a PCN reaction that required hospitalization: No Has patient had a PCN reaction occurring within the last 10 years: No If all of the above answers are "NO", then may proceed with Cephalosporin use.   Vancomycin Other (See Comments)   Edema and myalgia      Medication List       Accurate as of July 08, 2018 12:25 PM. Always use your most recent med list.        Accu-Chek FastClix Lancets Misc Check fasting blood sugar once daily   allopurinol 300 MG tablet Commonly known as:  ZYLOPRIM Take 0.5 tablets (150 mg total) by mouth daily.   amLODipine 5 MG tablet Commonly known as:  NORVASC Take 1 tablet (5 mg total) by mouth daily. Please keep upcoming appt in  April with Dr. Johnsie Cancel before anymore refills. Thank you   apixaban 2.5 MG Tabs tablet Commonly known as:  Eliquis Take 1 tablet (2.5 mg total) by mouth 2 (two) times daily.   atorvastatin 20 MG tablet Commonly known as:  LIPITOR Take 1 tablet (20 mg total) by mouth at bedtime.   carvedilol 6.25 MG tablet Commonly known as:  COREG Take 1 tablet (6.25 mg total) by mouth 2 (two) times daily with a meal.   cholecalciferol 1000 units tablet Commonly known as:  VITAMIN D Take 1,000 Units by mouth daily.   CHROMIUM GTF PO Take 1 capsule by mouth every evening.   Cinnamon 500 MG Tabs Take 2 tablets by mouth at bedtime.   Co Q 10 100 MG Caps Take 100 mg by mouth daily.   dicyclomine 10 MG capsule Commonly known as:  BENTYL Take 1 capsule (10 mg total) by mouth 4 (four) times daily -  before meals and at bedtime.   finasteride 5 MG tablet Commonly known as:  PROSCAR Take 5 mg by mouth daily.   fish oil-omega-3 fatty acids 1000 MG capsule Take 1 g by mouth daily.   furosemide 20 MG tablet Commonly known as:  LASIX Take 1 tablet (20 mg total) by mouth as directed.   glucose blood test strip Commonly known as:  Accu-Chek Guide Check  fasting blood sugar once daily   potassium chloride 10 MEQ tablet Commonly known as:  K-DUR Take 1 tablet (10 mEq total) by mouth daily.   PRESERVISION AREDS PO Take 1 tablet by mouth 2 (two) times daily.   PROBIOTIC PO Take 1 tablet by mouth daily.   tamsulosin 0.4 MG Caps capsule Commonly known as:  FLOMAX TAKE 1 CAPSULE BY MOUTH ONCE DAILY AFTER SUPPER   VANADYL SULFATE PO Take 1 capsule by mouth every evening.   zinc gluconate 50 MG tablet Take 50 mg by mouth daily.       Allergies:  Allergies  Allergen Reactions  . Penicillins Hives    Has patient had a PCN reaction causing immediate rash, facial/tongue/throat swelling, SOB or lightheadedness with hypotension: No Has patient had a PCN reaction causing severe rash involving  mucus membranes or skin necrosis: Yes Has patient had a PCN reaction that required hospitalization: No Has patient had a PCN reaction occurring within the last 10 years: No If all of the above answers are "NO", then may proceed with Cephalosporin use.   . Vancomycin Other (See Comments)    Edema and myalgia    Past Medical History, Surgical history, Social history, and Family History were reviewed and updated.  Review of Systems: Review of Systems  Constitutional: Negative.   HENT: Negative.   Eyes: Negative.   Respiratory: Negative.   Cardiovascular: Positive for palpitations.  Gastrointestinal: Negative.   Genitourinary: Negative.   Musculoskeletal: Negative.   Skin: Negative.   Neurological: Negative.   Endo/Heme/Allergies: Negative.   Psychiatric/Behavioral: Negative.      Physical Exam:  weight is 151 lb 8 oz (68.7 kg). His oral temperature is 97.8 F (36.6 C). His blood pressure is 156/57 (abnormal) and his pulse is 59 (abnormal). His respiration is 17 and oxygen saturation is 100%.   Wt Readings from Last 3 Encounters:  07/08/18 151 lb 8 oz (68.7 kg)  06/21/18 154 lb (69.9 kg)  06/08/18 153 lb 2 oz (69.5 kg)    Physical Exam Vitals signs reviewed.  HENT:     Head: Normocephalic and atraumatic.  Eyes:     Pupils: Pupils are equal, round, and reactive to light.  Neck:     Musculoskeletal: Normal range of motion.  Cardiovascular:     Rate and Rhythm: Normal rate and regular rhythm.     Heart sounds: Normal heart sounds.  Pulmonary:     Effort: Pulmonary effort is normal.     Breath sounds: Normal breath sounds.  Abdominal:     General: Bowel sounds are normal.     Palpations: Abdomen is soft.  Musculoskeletal: Normal range of motion.        General: No tenderness or deformity.  Lymphadenopathy:     Cervical: No cervical adenopathy.  Skin:    General: Skin is warm and dry.     Findings: No erythema or rash.  Neurological:     Mental Status: He is  alert and oriented to person, place, and time.  Psychiatric:        Behavior: Behavior normal.        Thought Content: Thought content normal.        Judgment: Judgment normal.      Lab Results  Component Value Date   WBC 6.2 07/08/2018   HGB 11.5 (L) 07/08/2018   HCT 35.4 (L) 07/08/2018   MCV 93.4 07/08/2018   PLT 147 (L) 07/08/2018   Lab Results  Component Value Date  FERRITIN 461 (H) 05/14/2018   IRON 42 05/14/2018   TIBC 230 05/14/2018   UIBC 188 05/14/2018   IRONPCTSAT 18 (L) 05/14/2018   Lab Results  Component Value Date   RETICCTPCT 1.4 07/08/2018   RBC 3.79 (L) 07/08/2018   RBC 3.79 (L) 07/08/2018   No results found for: KPAFRELGTCHN, LAMBDASER, KAPLAMBRATIO No results found for: Kandis Cocking, IGMSERUM Lab Results  Component Value Date   TOTALPROTELP 6.0 (L) 12/26/2015   ALBUMINELP 3.4 (L) 12/26/2015   A1GS 0.4 (H) 12/26/2015   A2GS 0.8 12/26/2015   BETS 0.4 12/26/2015   BETA2SER 0.3 12/26/2015   GAMS 0.8 12/26/2015   MSPIKE Not Observed 01/29/2016   SPEI SEE NOTE 12/26/2015     Chemistry      Component Value Date/Time   NA 139 07/08/2018 1059   NA 141 08/11/2017 0924   NA 145 04/09/2017 0905   NA 140 03/28/2016 0853   K 4.5 07/08/2018 1059   K 4.5 04/09/2017 0905   K 4.4 03/28/2016 0853   CL 110 07/08/2018 1059   CL 105 04/09/2017 0905   CO2 21 (L) 07/08/2018 1059   CO2 27 04/09/2017 0905   CO2 19 (L) 03/28/2016 0853   BUN 35 (H) 07/08/2018 1059   BUN 27 08/11/2017 0924   BUN 33 (H) 04/09/2017 0905   BUN 34.4 (H) 03/28/2016 0853   CREATININE 1.77 (H) 07/08/2018 1059   CREATININE 1.72 (H) 08/18/2017 1543   CREATININE 1.7 (H) 03/28/2016 0853   GLU 113 07/24/2016      Component Value Date/Time   CALCIUM 8.8 (L) 07/08/2018 1059   CALCIUM 9.2 04/09/2017 0905   CALCIUM 9.2 03/28/2016 0853   ALKPHOS 67 07/08/2018 1059   ALKPHOS 69 04/09/2017 0905   ALKPHOS 85 03/28/2016 0853   AST 23 07/08/2018 1059   AST 23 03/28/2016 0853   ALT 27  07/08/2018 1059   ALT 27 04/09/2017 0905   ALT 37 03/28/2016 0853   BILITOT 0.5 07/08/2018 1059   BILITOT 0.79 03/28/2016 0853      Impression and Plan: Ms. Nevares is a very pleasant 85 yo caucasian gentleman with both erythropoietin deficiency and iron deficiency anemia.   He does not need any Aranesp today.  We will see what his iron studies look like.  I will plan to get him back in 6 weeks.  If he has any problems between now and then, he will let me know in church.   [name], MD 3/12/202012:25 PM

## 2018-07-09 ENCOUNTER — Telehealth: Payer: Self-pay | Admitting: *Deleted

## 2018-07-09 LAB — IRON AND TIBC
Iron: 95 ug/dL (ref 42–163)
Saturation Ratios: 41 % (ref 20–55)
TIBC: 234 ug/dL (ref 202–409)
UIBC: 139 ug/dL (ref 117–376)

## 2018-07-09 LAB — FERRITIN: Ferritin: 291 ng/mL (ref 24–336)

## 2018-07-09 NOTE — Telephone Encounter (Addendum)
Patient is aware of results  ----- Message from [name], MD sent at 07/09/2018  9:42 AM EDT ----- Call - the iron level is ok!!  Laurey Arrow

## 2018-07-27 ENCOUNTER — Telehealth: Payer: Self-pay

## 2018-07-27 NOTE — Telephone Encounter (Signed)
Virtual Visit Pre-Appointment Phone Call  Steps For Call:  1. Confirm consent - "In the setting of the current Covid19 crisis, you are scheduled for a (phone or video) visit with your provider on (date) at (time).  Just as we do with many in-office visits, in order for you to participate in this visit, we must obtain consent.  If you'd like, I can send this to your mychart (if signed up) or email for you to review.  Otherwise, I can obtain your verbal consent now.  All virtual visits are billed to your insurance company just like a normal visit would be.  By agreeing to a virtual visit, we'd like you to understand that the technology does not allow for your provider to perform an examination, and thus may limit your provider's ability to fully assess your condition.  Finally, though the technology is pretty good, we cannot assure that it will always work on either your or our end, and in the setting of a video visit, we may have to convert it to a phone-only visit.  In either situation, we cannot ensure that we have a secure connection.  Are you willing to proceed?"  2. Give patient instructions for WebEx download to smartphone as below if video visit  3. Advise patient to be prepared with any vital sign or heart rhythm information, their current medicines, and a piece of paper and pen handy for any instructions they may receive the day of their visit  4. Inform patient they will receive a phone call 15 minutes prior to their appointment time (may be from unknown caller ID) so they should be prepared to answer  5. Confirm that appointment type is correct in Epic appointment notes (video vs telephone)    TELEPHONE CALL NOTE  [name] has been deemed a candidate for a follow-up tele-health visit to limit community exposure during the Covid-19 pandemic. I spoke with the patient via phone to ensure availability of phone/video source, confirm preferred email & phone number, and discuss  instructions and expectations.  I reminded [name] to be prepared with any vital sign and/or heart rhythm information that could potentially be obtained via home monitoring, at the time of his visit. I reminded [name] to expect a phone call at the time of his visit if his visit.  Did the patient verbally acknowledge consent to treatment? Yes  Michaelyn Barter, RN 07/27/2018 3:58 PM   DOWNLOADING THE [place], go to [employer] and type in WebEx in the search bar. [place] [employer], the blue/green circle. The app is free but as with any other app downloads, their phone may require them to verify saved payment information or Apple password. The patient does NOT have to create an account.  - If Android, ask patient to go to [employer] and type in WebEx in the search bar. [place] [employer], the blue/green circle. The app is free but as with any other app downloads, their phone may require them to verify saved payment information or Android password. The patient does NOT have to create an account.   CONSENT FOR TELE-HEALTH VISIT - PLEASE REVIEW  I hereby voluntarily request, consent and authorize [facility] and its employed or contracted physicians, physician assistants, nurse practitioners or other licensed health care professionals (the Practitioner), to provide me with telemedicine health care services (the "Services") as deemed necessary by the treating Practitioner.  I acknowledge and consent to receive the Services by the Practitioner via telemedicine. I understand that the telemedicine visit will involve communicating with the Practitioner through live audiovisual communication technology and the disclosure of certain medical information by electronic transmission. I acknowledge that I have been given the opportunity to request an in-person assessment or other available alternative prior to the telemedicine visit  and am voluntarily participating in the telemedicine visit.  I understand that I have the right to withhold or withdraw my consent to the use of telemedicine in the course of my care at any time, without affecting my right to future care or treatment, and that the Practitioner or I may terminate the telemedicine visit at any time. I understand that I have the right to inspect all information obtained and/or recorded in the course of the telemedicine visit and may receive copies of available information for a reasonable fee.  I understand that some of the potential risks of receiving the Services via telemedicine include:  Marland Kitchen Delay or interruption in medical evaluation due to technological equipment failure or disruption; . Information transmitted may not be sufficient (e.g. poor resolution of images) to allow for appropriate medical decision making by the Practitioner; and/or  . In rare instances, security protocols could fail, causing a breach of personal health information.  Furthermore, I acknowledge that it is my responsibility to provide information about my medical history, conditions and care that is complete and accurate to the best of my ability. I acknowledge that Practitioner's advice, recommendations, and/or decision may be based on factors not within their control, such as incomplete or inaccurate data provided by me or distortions of diagnostic images or specimens that may result from electronic transmissions. I understand that the practice of medicine is not an exact science and that Practitioner makes no warranties or guarantees regarding treatment outcomes. I acknowledge that I will receive a copy of this consent concurrently upon execution via email to the email address I last provided but may also request a printed copy by calling the office of [place].    I understand that my insurance will be billed for this visit.   I have read or had this consent read to me. . I understand  the contents of this consent, which adequately explains the benefits and risks of the Services being provided via telemedicine.  . I have been provided ample opportunity to ask questions regarding this consent and the Services and have had my questions answered to my satisfaction. . I give my informed consent for the services to be provided through the use of telemedicine in my medical care  By participating in this telemedicine visit I agree to the above.

## 2018-08-02 ENCOUNTER — Other Ambulatory Visit: Payer: Self-pay

## 2018-08-02 ENCOUNTER — Telehealth: Payer: Self-pay | Admitting: Cardiovascular Disease

## 2018-08-02 ENCOUNTER — Ambulatory Visit (INDEPENDENT_AMBULATORY_CARE_PROVIDER_SITE_OTHER): Payer: Medicare Other | Admitting: *Deleted

## 2018-08-02 DIAGNOSIS — I441 Atrioventricular block, second degree: Secondary | ICD-10-CM | POA: Diagnosis not present

## 2018-08-02 LAB — CUP PACEART REMOTE DEVICE CHECK
Battery Remaining Longevity: 104 mo
Battery Remaining Percentage: 95.5 %
Battery Voltage: 2.99 V
Brady Statistic AP VP Percent: 35 %
Brady Statistic AP VS Percent: 1 %
Brady Statistic AS VP Percent: 62 %
Brady Statistic AS VS Percent: 1.9 %
Brady Statistic RA Percent Paced: 17 %
Brady Statistic RV Percent Paced: 95 %
Date Time Interrogation Session: 20200406061353
Implantable Lead Implant Date: 20190402
Implantable Lead Implant Date: 20190402
Implantable Lead Location: 753859
Implantable Lead Location: 753860
Implantable Pulse Generator Implant Date: 20190402
Lead Channel Impedance Value: 360 Ohm
Lead Channel Impedance Value: 480 Ohm
Lead Channel Pacing Threshold Amplitude: 0.5 V
Lead Channel Pacing Threshold Amplitude: 0.75 V
Lead Channel Pacing Threshold Pulse Width: 0.5 ms
Lead Channel Pacing Threshold Pulse Width: 0.5 ms
Lead Channel Sensing Intrinsic Amplitude: 12 mV
Lead Channel Sensing Intrinsic Amplitude: 2 mV
Lead Channel Setting Pacing Amplitude: 2 V
Lead Channel Setting Pacing Amplitude: 2.5 V
Lead Channel Setting Pacing Pulse Width: 0.5 ms
Lead Channel Setting Sensing Sensitivity: 2 mV
Pulse Gen Model: 2272
Pulse Gen Serial Number: 9003454

## 2018-08-02 NOTE — Progress Notes (Signed)
Virtual Visit via Video Note   This visit type was conducted due to national recommendations for restrictions regarding the COVID-19 Pandemic (e.g. social distancing) in an effort to limit this patient's exposure and mitigate transmission in our community.  Due to his co-morbid illnesses, this patient is at least at moderate risk for complications without adequate follow up.  This format is felt to be most appropriate for this patient at this time.  All issues noted in this document were discussed and addressed.  A limited physical exam was performed with this format.  Please refer to the patient's chart for his consent to telehealth for [facility].   Evaluation Performed:  Follow-up visit  Date:  08/02/2018   ID:  [name], DOB [birth year], MRN [id number]  Patient Location: Home  Provider Location: Office  PCP:  [name], DO  Cardiologist:  Jenkins Rouge, MD   Electrophysiologist:  None   Chief Complaint:  Pacer/PAF   History of Present Illness:    [name] is a 85 y.o. male who presents via audio/video conferencing for a telehealth visit today.    Seen mostly by Dr Rayann Heman and EP last few years. Had CHB with St. Jude Assurity MRI compatible pacer placed 07/28/17. He is on chronic anticoagulation low dose due to CRF for PAF/Flutter. Last office visit with Dr Rayann Heman 05/2018 was paced out of flutter. His note only indicates rate control strategy He has failed multiple previous cardioversions  He has distant history of CAD with CABG in 1999 (LIMA to LAD, SVG to IM Sequenced to OM1/OM2 , SVG D1 and SVG PDA.Grafts patent by cath 2011 Nuclear stress test 08/2015 scar, no ischemia LVEF 70%   Sees Ennever for erythropoietin deficiency and iron deficiency anemia Has had Aranesp and iron infusions last Hct stable 35.4 07/08/18  Carotids 10/02/17 reviewed plaque no stenosis  Echo 04/03/17 EF 55-60% mild to moderate MR moderate LAE mild RAE  The patient does not have  symptoms concerning for COVID-19 infection (fever, chills, cough, or new shortness of breath). Son is doing the grocery shopping for him    Past Medical History:  Diagnosis Date  . A-fib ([place])   . Basal cell carcinoma    skin  . Bigeminal rhythm   . Cataract   . Chronic renal insufficiency, stage 3 (moderate) (HCC) 2018   GFR 30s-40s  . Coronary artery disease    post bypass  . CVD (cardiovascular disease)   . Diabetes mellitus without complication ([place])   . Diverticular disease   . Easy bruising   . 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 Gout   . Hernia   . Hyperlipidemia   . Hypertension   . IBS (irritable bowel syndrome)   . Iron deficiency anemia 02/04/2016  . Macular degeneration   . Microscopic colitis   . PVD (peripheral vascular disease) ([place])    Past Surgical History:  Procedure Laterality Date  . CARDIAC CATHETERIZATION  2006  . CARDIOVERSION N/A 02/22/2013   Procedure: CARDIOVERSION;  Surgeon: [name], MD;  Location: [facility] ENDOSCOPY;  Service: Cardiovascular;  Laterality: N/A;  . CARDIOVERSION N/A 11/15/2014   Procedure: CARDIOVERSION;  Surgeon: [name], MD;  Location: [facility] ENDOSCOPY;  Service: Cardiovascular;  Laterality: N/A;  . CARDIOVERSION N/A 04/01/2017   Procedure: CARDIOVERSION;  Surgeon: Larey Dresser, MD;  Location: [place];  Service: Cardiovascular;  Laterality: N/A;  . CAROTID ENDARTERECTOMY  2009/ 1993   left/ right  . COLONOSCOPY  03/17/2005   The colon is normal.  . CORONARY ARTERY BYPASS GRAFT  1999  . ESOPHAGOGASTRODUODENOSCOPY  03/17/2005   Normal esophagus. Normal Stomanch. Normal duodenum.   Marland Kitchen EYE SURGERY     eyelid repair  . INGUINAL HERNIA REPAIR  02/11/2012   Procedure: LAPAROSCOPIC BILATERAL INGUINAL HERNIA REPAIR;  Surgeon: Pedro Earls, MD;  Location: [facility] ORS;  Service: General;  Laterality: Bilateral;  . PACEMAKER IMPLANT N/A 07/28/2017   St Jude Medical Assurity MRI conditional  dual-chamber pacemaker for  symptomatic second degree AV block by Dr Rayann Heman  . SKIN CANCER EXCISION     right ear x 3     No outpatient medications have been marked as taking for the 08/05/18 encounter (Appointment) with [name], MD.     Allergies:   Penicillins and Vancomycin   Social History   Tobacco Use  . Smoking status: Former Smoker    Last attempt to quit: 12/25/1961    Years since quitting: 56.6  . Smokeless tobacco: Former Systems developer    Types: Chew    Quit date: 02/03/1969  Substance Use Topics  . Alcohol use: No    Alcohol/week: 0.0 standard drinks  . Drug use: No     Family Hx: The patient's family history includes Coronary artery disease in his father; Heart disease in his father; Melanoma in his sister; Stroke in his mother. There is no history of Colon cancer.  ROS:   Please see the history of present illness.     All other systems reviewed and are negative.   Prior CV studies:   The following studies were reviewed today:  Carotids 10/02/17 reviewed plaque no stenosis  Echo 04/03/17 EF 55-60% mild to moderate MR moderate LAE mild RA  Labs/Other Tests and Data Reviewed:    EKG:  Flutter rate 72 v-pacing LBBB 06/21/18  Recent Labs: 05/04/2018: TSH 2.68 07/08/2018: ALT 27; BUN 35; Creatinine 1.77; Hemoglobin 11.5; Platelet Count 147; Potassium 4.5; Sodium 139   Recent Lipid Panel Lab Results  Component Value Date/Time   CHOL 110 06/21/2017 04:20 AM   TRIG 44 06/21/2017 04:20 AM   HDL 46 06/21/2017 04:20 AM   CHOLHDL 2.4 06/21/2017 04:20 AM   LDLCALC 55 06/21/2017 04:20 AM   LDLDIRECT 49 01/15/2017 10:31 AM    Wt Readings from Last 3 Encounters:  07/08/18 68.7 kg  06/21/18 69.9 kg  06/08/18 69.5 kg     Objective:    Vital Signs:  There were no vitals taken for this visit.   Overweight white male  in no distress No tachypnea  JVP not elevated Skin warm and dry excoriation on nose from tree limb No edema    ASSESSMENT & PLAN:    1. PAF:  F/u Allred continue low dose  eliquis and rate control 2. Pacer:  St Jude MRI compatible normal function f/u EP 3. CAD:  Distant CABG no angina continue medical Rx last myovue 2017 non ischemic 4. Anemia:  In setting of CRF f/u Ennevar Aranesp and iron as needed Hct stable 35.4  5. CRF:  Baseline CR about 1.6 avoid dehydration f/u primary   COVID-19 Education: The signs and symptoms of COVID-19 were discussed with the patient and how to seek care for testing (follow up with PCP or arrange E-visit).  The importance of social distancing was discussed today.  Time:   Today, I have spent 30 minutes with the patient with telehealth technology discussing the above problems.     Medication  Adjustments/Labs and Tests Ordered: Current medicines are reviewed at length with the patient today.  Concerns regarding medicines are outlined above.  Tests Ordered: No orders of the defined types were placed in this encounter.  Medication Changes: No orders of the defined types were placed in this encounter.   Disposition:  Follow up EP in 6 months and me in a year  Signed, Jenkins Rouge, MD  08/02/2018 9:47 AM    [place]

## 2018-08-02 NOTE — Telephone Encounter (Signed)
New message     Webex app has been downloaded for 08-05-18 ov.

## 2018-08-05 ENCOUNTER — Other Ambulatory Visit: Payer: Self-pay

## 2018-08-05 ENCOUNTER — Encounter: Payer: Self-pay | Admitting: Cardiovascular Disease

## 2018-08-05 ENCOUNTER — Telehealth (INDEPENDENT_AMBULATORY_CARE_PROVIDER_SITE_OTHER): Payer: Medicare Other | Admitting: Cardiovascular Disease

## 2018-08-05 VITALS — BP 155/73 | HR 74 | Ht 68.0 in | Wt 155.0 lb

## 2018-08-05 DIAGNOSIS — I480 Paroxysmal atrial fibrillation: Secondary | ICD-10-CM | POA: Diagnosis not present

## 2018-08-05 NOTE — Patient Instructions (Addendum)
Medication Instructions:   If you need a refill on your cardiac medications before your next appointment, please call your pharmacy.   Lab work:  If you have labs (blood work) drawn today and your tests are completely normal, you will receive your results only by: Marland Kitchen MyChart Message (if you have MyChart) OR . A paper copy in the mail If you have any lab test that is abnormal or we need to change your treatment, we will call you to review the results.  Testing/Procedures: None ordered at this time.  Follow-Up: At [facility], you and your health needs are our priority.  As part of our continuing mission to provide you with exceptional heart care, we have created designated Provider Care Teams.  These Care Teams include your primary Cardiologist (physician) and Advanced Practice Providers (APPs -  Physician Assistants and Nurse Practitioners) who all work together to provide you with the care you need, when you need it. You will need a follow up appointment in 12 months.  Please call our office 2 months in advance to schedule this appointment.  You may see Jenkins Rouge, MD or one of the following Advanced Practice Providers on your designated Care Team:   Truitt Merle, NP Cecilie Kicks, NP . Kathyrn Drown, NP  Your physician wants you to follow-up in: 6 months with Dr. Rayann Heman. You will receive a reminder letter in the mail two months in advance. If you don't receive a letter, please call our office to schedule the follow-up appointment.

## 2018-08-10 ENCOUNTER — Encounter: Payer: Self-pay | Admitting: Cardiology

## 2018-08-10 NOTE — Progress Notes (Signed)
Remote pacemaker transmission.   

## 2018-08-26 ENCOUNTER — Inpatient Hospital Stay: Payer: Medicare Other | Attending: Hematology & Oncology | Admitting: Hematology & Oncology

## 2018-08-26 ENCOUNTER — Other Ambulatory Visit: Payer: Self-pay

## 2018-08-26 ENCOUNTER — Inpatient Hospital Stay: Payer: Medicare Other

## 2018-08-26 ENCOUNTER — Telehealth: Payer: Self-pay | Admitting: *Deleted

## 2018-08-26 VITALS — BP 158/62 | HR 71 | Temp 98.1°F | Resp 17 | Wt 154.0 lb

## 2018-08-26 DIAGNOSIS — N189 Chronic kidney disease, unspecified: Secondary | ICD-10-CM | POA: Insufficient documentation

## 2018-08-26 DIAGNOSIS — D500 Iron deficiency anemia secondary to blood loss (chronic): Secondary | ICD-10-CM

## 2018-08-26 DIAGNOSIS — D631 Anemia in chronic kidney disease: Secondary | ICD-10-CM | POA: Insufficient documentation

## 2018-08-26 DIAGNOSIS — D509 Iron deficiency anemia, unspecified: Secondary | ICD-10-CM | POA: Diagnosis not present

## 2018-08-26 DIAGNOSIS — E7800 Pure hypercholesterolemia, unspecified: Secondary | ICD-10-CM

## 2018-08-26 LAB — CBC WITH DIFFERENTIAL (CANCER CENTER ONLY)
Abs Immature Granulocytes: 0.03 10*3/uL (ref 0.00–0.07)
Basophils Absolute: 0 10*3/uL (ref 0.0–0.1)
Basophils Relative: 0 %
Eosinophils Absolute: 0.2 10*3/uL (ref 0.0–0.5)
Eosinophils Relative: 4 %
HCT: 35.1 % — ABNORMAL LOW (ref 39.0–52.0)
Hemoglobin: 11.6 g/dL — ABNORMAL LOW (ref 13.0–17.0)
Immature Granulocytes: 1 %
Lymphocytes Relative: 33 %
Lymphs Abs: 2.1 10*3/uL (ref 0.7–4.0)
MCH: 30.1 pg (ref 26.0–34.0)
MCHC: 33 g/dL (ref 30.0–36.0)
MCV: 91.2 fL (ref 80.0–100.0)
Monocytes Absolute: 0.5 10*3/uL (ref 0.1–1.0)
Monocytes Relative: 8 %
Neutro Abs: 3.5 10*3/uL (ref 1.7–7.7)
Neutrophils Relative %: 54 %
Platelet Count: 152 10*3/uL (ref 150–400)
RBC: 3.85 MIL/uL — ABNORMAL LOW (ref 4.22–5.81)
RDW: 15.4 % (ref 11.5–15.5)
WBC Count: 6.3 10*3/uL (ref 4.0–10.5)
nRBC: 0 % (ref 0.0–0.2)

## 2018-08-26 LAB — CMP (CANCER CENTER ONLY)
ALT: 50 U/L — ABNORMAL HIGH (ref 0–44)
AST: 35 U/L (ref 15–41)
Albumin: 4.2 g/dL (ref 3.5–5.0)
Alkaline Phosphatase: 61 U/L (ref 38–126)
Anion gap: 8 (ref 5–15)
BUN: 34 mg/dL — ABNORMAL HIGH (ref 8–23)
CO2: 22 mmol/L (ref 22–32)
Calcium: 9.3 mg/dL (ref 8.9–10.3)
Chloride: 108 mmol/L (ref 98–111)
Creatinine: 1.76 mg/dL — ABNORMAL HIGH (ref 0.61–1.24)
GFR, Est AFR Am: 39 mL/min — ABNORMAL LOW (ref >60)
GFR, Estimated: 34 mL/min — ABNORMAL LOW (ref >60)
Glucose, Bld: 134 mg/dL — ABNORMAL HIGH (ref 70–99)
Potassium: 4.5 mmol/L (ref 3.5–5.1)
Sodium: 138 mmol/L (ref 135–145)
Total Bilirubin: 0.6 mg/dL (ref 0.3–1.2)
Total Protein: 7 g/dL (ref 6.5–8.1)

## 2018-08-26 LAB — RETICULOCYTES
Immature Retic Fract: 12.2 % (ref 2.3–15.9)
RBC.: 3.86 MIL/uL — ABNORMAL LOW (ref 4.22–5.81)
Retic Count, Absolute: 57.9 10*3/uL (ref 19.0–186.0)
Retic Ct Pct: 1.5 % (ref 0.4–3.1)

## 2018-08-26 LAB — FERRITIN: Ferritin: 385 ng/mL — ABNORMAL HIGH (ref 24–336)

## 2018-08-26 LAB — IRON AND TIBC
Iron: 88 ug/dL (ref 42–163)
Saturation Ratios: 35 % (ref 20–55)
TIBC: 251 ug/dL (ref 202–409)
UIBC: 164 ug/dL (ref 117–376)

## 2018-08-26 NOTE — Telephone Encounter (Signed)
Patient's wife notified per order of Dr. Marin Olp that patient's iron level is doing okay. Patient's wife appreciative of call and has no questions or concerns at this time.

## 2018-08-26 NOTE — Telephone Encounter (Signed)
-----   Message from [name], MD sent at 08/26/2018  1:30 PM EDT ----- Please call and tell him that his iron level is doing okay.  Thanks.

## 2018-08-26 NOTE — Progress Notes (Signed)
Hematology and Oncology Follow Up Visit  [name] [id number] [birth year] 85 y.o. 08/26/2018   Principle Diagnosis:  Erythropoietin deficiency anemia Iron deficiency anemia  Current Therapy:   IV iron as indicated Aranesp 300 g subcutaneous as needed for hemoglobin less than 11 - last dose given on  05/14/2018   Interim History:  [name] is here today for follow-up.  He is doing quite well.  He is missing church.  He is 1 of my church members.  Is hard not be able to see him and his wife in charge.  He is doing great with respect to his anemia.  He has not had Aranesp now for 3 months.  His iron studies back in March showed a ferritin of 291 with an iron saturation of 41%.  He has had no bleeding.  His kidney function seems to be doing a little bit better.  He is having no change in bowel or bladder habits.  There is no cardiac issues.  He has had no cough.  He has had no fever.  His appetite is doing well.  Overall, his performance status is ECOG 2.  Medications:  Allergies as of 08/26/2018      Reactions   Penicillins Hives   Has patient had a PCN reaction causing immediate rash, facial/tongue/throat swelling, SOB or lightheadedness with hypotension: No Has patient had a PCN reaction causing severe rash involving mucus membranes or skin necrosis: Yes Has patient had a PCN reaction that required hospitalization: No Has patient had a PCN reaction occurring within the last 10 years: No If all of the above answers are "NO", then may proceed with Cephalosporin use.   Vancomycin Other (See Comments)   Edema and myalgia      Medication List       Accurate as of August 26, 2018 10:18 AM. Always use your most recent med list.        Accu-Chek FastClix Lancets Misc Check fasting blood sugar once daily   allopurinol 300 MG tablet Commonly known as:  ZYLOPRIM Take 0.5 tablets (150 mg total) by mouth daily.   amLODipine 5 MG tablet Commonly known as:  NORVASC  Take 1 tablet (5 mg total) by mouth daily. Please keep upcoming appt in April with [name] before anymore refills. Thank you   apixaban 2.5 MG Tabs tablet Commonly known as:  Eliquis Take 1 tablet (2.5 mg total) by mouth 2 (two) times daily.   atorvastatin 20 MG tablet Commonly known as:  LIPITOR Take 1 tablet (20 mg total) by mouth at bedtime.   carvedilol 6.25 MG tablet Commonly known as:  COREG Take 1 tablet (6.25 mg total) by mouth 2 (two) times daily with a meal.   cholecalciferol 1000 units tablet Commonly known as:  VITAMIN D Take 1,000 Units by mouth daily.   CHROMIUM GTF PO Take 1 capsule by mouth every evening.   Cinnamon 500 MG Tabs Take 2 tablets by mouth at bedtime.   Co Q 10 100 MG Caps Take 100 mg by mouth daily.   dicyclomine 10 MG capsule Commonly known as:  BENTYL Take 1 capsule (10 mg total) by mouth 4 (four) times daily -  before meals and at bedtime.   diphenoxylate-atropine 2.5-0.025 MG tablet Commonly known as:  LOMOTIL   finasteride 5 MG tablet Commonly known as:  PROSCAR Take 5 mg by mouth daily.   fish oil-omega-3 fatty acids 1000 MG capsule Take 1 g by mouth daily.  furosemide 20 MG tablet Commonly known as:  LASIX Take 1 tablet (20 mg total) by mouth as directed.   glucose blood test strip Commonly known as:  Accu-Chek Guide Check fasting blood sugar once daily   latanoprost 0.005 % ophthalmic solution Commonly known as:  XALATAN Place 1 drop into both eyes at bedtime.   potassium chloride 10 MEQ tablet Commonly known as:  K-DUR Take 1 tablet (10 mEq total) by mouth daily.   PRESERVISION AREDS PO Take 1 tablet by mouth 2 (two) times daily.   PROBIOTIC PO Take 1 tablet by mouth daily.   tamsulosin 0.4 MG Caps capsule Commonly known as:  FLOMAX TAKE 1 CAPSULE BY MOUTH ONCE DAILY AFTER SUPPER   VANADYL SULFATE PO Take 1 capsule by mouth every evening.   zinc gluconate 50 MG tablet Take 50 mg by mouth daily.        Allergies:  Allergies  Allergen Reactions  . Penicillins Hives    Has patient had a PCN reaction causing immediate rash, facial/tongue/throat swelling, SOB or lightheadedness with hypotension: No Has patient had a PCN reaction causing severe rash involving mucus membranes or skin necrosis: Yes Has patient had a PCN reaction that required hospitalization: No Has patient had a PCN reaction occurring within the last 10 years: No If all of the above answers are "NO", then may proceed with Cephalosporin use.   . Vancomycin Other (See Comments)    Edema and myalgia    Past Medical History, Surgical history, Social history, and Family History were reviewed and updated.  Review of Systems: Review of Systems  Constitutional: Negative.   HENT: Negative.   Eyes: Negative.   Respiratory: Negative.   Cardiovascular: Positive for palpitations.  Gastrointestinal: Negative.   Genitourinary: Negative.   Musculoskeletal: Negative.   Skin: Negative.   Neurological: Negative.   Endo/Heme/Allergies: Negative.   Psychiatric/Behavioral: Negative.      Physical Exam:  weight is 154 lb (69.9 kg). His oral temperature is 98.1 F (36.7 C). His blood pressure is 158/62 (abnormal) and his pulse is 71. His respiration is 17 and oxygen saturation is 98%.   Wt Readings from Last 3 Encounters:  08/26/18 154 lb (69.9 kg)  08/05/18 155 lb (70.3 kg)  07/08/18 151 lb 8 oz (68.7 kg)    Physical Exam Vitals signs reviewed.  HENT:     Head: Normocephalic and atraumatic.  Eyes:     Pupils: Pupils are equal, round, and reactive to light.  Neck:     Musculoskeletal: Normal range of motion.  Cardiovascular:     Rate and Rhythm: Normal rate and regular rhythm.     Heart sounds: Normal heart sounds.  Pulmonary:     Effort: Pulmonary effort is normal.     Breath sounds: Normal breath sounds.  Abdominal:     General: Bowel sounds are normal.     Palpations: Abdomen is soft.  Musculoskeletal: Normal  range of motion.        General: No tenderness or deformity.  Lymphadenopathy:     Cervical: No cervical adenopathy.  Skin:    General: Skin is warm and dry.     Findings: No erythema or rash.  Neurological:     Mental Status: He is alert and oriented to person, place, and time.  Psychiatric:        Behavior: Behavior normal.        Thought Content: Thought content normal.        Judgment: Judgment normal.  Lab Results  Component Value Date   WBC 6.3 08/26/2018   HGB 11.6 (L) 08/26/2018   HCT 35.1 (L) 08/26/2018   MCV 91.2 08/26/2018   PLT 152 08/26/2018   Lab Results  Component Value Date   FERRITIN 291 07/08/2018   IRON 95 07/08/2018   TIBC 234 07/08/2018   UIBC 139 07/08/2018   IRONPCTSAT 41 07/08/2018   Lab Results  Component Value Date   RETICCTPCT 1.5 08/26/2018   RBC 3.85 (L) 08/26/2018   No results found for: KPAFRELGTCHN, LAMBDASER, KAPLAMBRATIO No results found for: Kandis Cocking, IGMSERUM Lab Results  Component Value Date   TOTALPROTELP 6.0 (L) 12/26/2015   ALBUMINELP 3.4 (L) 12/26/2015   A1GS 0.4 (H) 12/26/2015   A2GS 0.8 12/26/2015   BETS 0.4 12/26/2015   BETA2SER 0.3 12/26/2015   GAMS 0.8 12/26/2015   MSPIKE Not Observed 01/29/2016   SPEI SEE NOTE 12/26/2015     Chemistry      Component Value Date/Time   NA 138 08/26/2018 0859   NA 141 08/11/2017 0924   NA 145 04/09/2017 0905   NA 140 03/28/2016 0853   K 4.5 08/26/2018 0859   K 4.5 04/09/2017 0905   K 4.4 03/28/2016 0853   CL 108 08/26/2018 0859   CL 105 04/09/2017 0905   CO2 22 08/26/2018 0859   CO2 27 04/09/2017 0905   CO2 19 (L) 03/28/2016 0853   BUN 34 (H) 08/26/2018 0859   BUN 27 08/11/2017 0924   BUN 33 (H) 04/09/2017 0905   BUN 34.4 (H) 03/28/2016 0853   CREATININE 1.76 (H) 08/26/2018 0859   CREATININE 1.72 (H) 08/18/2017 1543   CREATININE 1.7 (H) 03/28/2016 0853   GLU 113 07/24/2016      Component Value Date/Time   CALCIUM 9.3 08/26/2018 0859   CALCIUM 9.2  04/09/2017 0905   CALCIUM 9.2 03/28/2016 0853   ALKPHOS 61 08/26/2018 0859   ALKPHOS 69 04/09/2017 0905   ALKPHOS 85 03/28/2016 0853   AST 35 08/26/2018 0859   AST 23 03/28/2016 0853   ALT 50 (H) 08/26/2018 0859   ALT 27 04/09/2017 0905   ALT 37 03/28/2016 0853   BILITOT 0.6 08/26/2018 0859   BILITOT 0.79 03/28/2016 0853      Impression and Plan: Ms. Hidalgo is a very pleasant 85 yo caucasian gentleman with both erythropoietin deficiency and iron deficiency anemia.   He does not need any Aranesp today.  We will see what his iron studies look like.  I will plan to get him back in 8 weeks.  If he has any problems between now and then, hopefully, he will see me in church if we can finally go to church despite the coronavirus pandemic.   [name], MD 4/30/202010:18 AM

## 2018-08-31 DIAGNOSIS — H401134 Primary open-angle glaucoma, bilateral, indeterminate stage: Secondary | ICD-10-CM | POA: Diagnosis not present

## 2018-09-08 ENCOUNTER — Other Ambulatory Visit: Payer: Self-pay | Admitting: Cardiovascular Disease

## 2018-09-28 DIAGNOSIS — H401134 Primary open-angle glaucoma, bilateral, indeterminate stage: Secondary | ICD-10-CM | POA: Diagnosis not present

## 2018-10-07 ENCOUNTER — Inpatient Hospital Stay: Payer: Medicare Other

## 2018-10-07 ENCOUNTER — Inpatient Hospital Stay: Payer: Medicare Other | Attending: Hematology & Oncology | Admitting: Hematology & Oncology

## 2018-10-07 ENCOUNTER — Other Ambulatory Visit: Payer: Self-pay

## 2018-10-07 ENCOUNTER — Encounter: Payer: Self-pay | Admitting: Hematology & Oncology

## 2018-10-07 VITALS — BP 154/57 | HR 83 | Temp 98.2°F | Resp 18 | Wt 155.0 lb

## 2018-10-07 DIAGNOSIS — D631 Anemia in chronic kidney disease: Secondary | ICD-10-CM

## 2018-10-07 DIAGNOSIS — D509 Iron deficiency anemia, unspecified: Secondary | ICD-10-CM

## 2018-10-07 DIAGNOSIS — N189 Chronic kidney disease, unspecified: Secondary | ICD-10-CM | POA: Insufficient documentation

## 2018-10-07 DIAGNOSIS — I25810 Atherosclerosis of coronary artery bypass graft(s) without angina pectoris: Secondary | ICD-10-CM

## 2018-10-07 DIAGNOSIS — E611 Iron deficiency: Secondary | ICD-10-CM | POA: Insufficient documentation

## 2018-10-07 DIAGNOSIS — D500 Iron deficiency anemia secondary to blood loss (chronic): Secondary | ICD-10-CM

## 2018-10-07 DIAGNOSIS — D508 Other iron deficiency anemias: Secondary | ICD-10-CM

## 2018-10-07 LAB — CMP (CANCER CENTER ONLY)
ALT: 51 U/L — ABNORMAL HIGH (ref 0–44)
AST: 41 U/L (ref 15–41)
Albumin: 4.2 g/dL (ref 3.5–5.0)
Alkaline Phosphatase: 55 U/L (ref 38–126)
Anion gap: 8 (ref 5–15)
BUN: 37 mg/dL — ABNORMAL HIGH (ref 8–23)
CO2: 23 mmol/L (ref 22–32)
Calcium: 9.1 mg/dL (ref 8.9–10.3)
Chloride: 108 mmol/L (ref 98–111)
Creatinine: 1.7 mg/dL — ABNORMAL HIGH (ref 0.61–1.24)
GFR, Est AFR Am: 41 mL/min — ABNORMAL LOW (ref >60)
GFR, Estimated: 35 mL/min — ABNORMAL LOW (ref >60)
Glucose, Bld: 123 mg/dL — ABNORMAL HIGH (ref 70–99)
Potassium: 4.8 mmol/L (ref 3.5–5.1)
Sodium: 139 mmol/L (ref 135–145)
Total Bilirubin: 0.7 mg/dL (ref 0.3–1.2)
Total Protein: 6.8 g/dL (ref 6.5–8.1)

## 2018-10-07 LAB — CBC WITH DIFFERENTIAL (CANCER CENTER ONLY)
Abs Immature Granulocytes: 0.02 10*3/uL (ref 0.00–0.07)
Basophils Absolute: 0 10*3/uL (ref 0.0–0.1)
Basophils Relative: 0 %
Eosinophils Absolute: 0.2 10*3/uL (ref 0.0–0.5)
Eosinophils Relative: 3 %
HCT: 32.7 % — ABNORMAL LOW (ref 39.0–52.0)
Hemoglobin: 10.7 g/dL — ABNORMAL LOW (ref 13.0–17.0)
Immature Granulocytes: 0 %
Lymphocytes Relative: 27 %
Lymphs Abs: 1.9 10*3/uL (ref 0.7–4.0)
MCH: 30.7 pg (ref 26.0–34.0)
MCHC: 32.7 g/dL (ref 30.0–36.0)
MCV: 93.7 fL (ref 80.0–100.0)
Monocytes Absolute: 0.7 10*3/uL (ref 0.1–1.0)
Monocytes Relative: 10 %
Neutro Abs: 4.1 10*3/uL (ref 1.7–7.7)
Neutrophils Relative %: 60 %
Platelet Count: 146 10*3/uL — ABNORMAL LOW (ref 150–400)
RBC: 3.49 MIL/uL — ABNORMAL LOW (ref 4.22–5.81)
RDW: 15.3 % (ref 11.5–15.5)
WBC Count: 6.9 10*3/uL (ref 4.0–10.5)
nRBC: 0 % (ref 0.0–0.2)

## 2018-10-07 MED ORDER — DARBEPOETIN ALFA 300 MCG/0.6ML IJ SOSY
PREFILLED_SYRINGE | INTRAMUSCULAR | Status: AC
  Filled 2018-10-07: qty 0.6

## 2018-10-07 MED ORDER — DARBEPOETIN ALFA 300 MCG/0.6ML IJ SOSY
300.0000 ug | PREFILLED_SYRINGE | Freq: Once | INTRAMUSCULAR | Status: AC
  Administered 2018-10-07: 300 ug via SUBCUTANEOUS

## 2018-10-07 NOTE — Progress Notes (Signed)
Hematology and Oncology Follow Up Visit  [name] [id number] Aug 15, 1928 85 y.o. 10/07/2018   Principle Diagnosis:  Erythropoietin deficiency anemia Iron deficiency anemia  Current Therapy:   IV iron as indicated Aranesp 300 g subcutaneous as needed for hemoglobin less than 11 - last dose given on  05/14/2018   Interim History:  [name] is here today for follow-up.  He is doing quite well.  Right now, he is glad that our church is back to having some services.  His wife go to the outdoor service.  My wife and I also go to the outdoor service.  We have a good time.  We have.  He is doing okay with his kidneys.  His creatinine today is 1.7 so this is improving slowly.  When he was last here in April, his ferritin was 385 with an iron saturation of 35%.  He has not had Aranesp for couple months.  I think he probably will need some today.  Overall, his performance status is ECOG 2.  Medications:  Allergies as of 10/07/2018      Reactions   Penicillins Hives   Has patient had a PCN reaction causing immediate rash, facial/tongue/throat swelling, SOB or lightheadedness with hypotension: No Has patient had a PCN reaction causing severe rash involving mucus membranes or skin necrosis: Yes Has patient had a PCN reaction that required hospitalization: No Has patient had a PCN reaction occurring within the last 10 years: No If all of the above answers are "NO", then may proceed with Cephalosporin use.   Vancomycin Other (See Comments)   Edema and myalgia      Medication List       Accurate as of October 07, 2018 11:34 AM. If you have any questions, ask your nurse or doctor.        Accu-Chek FastClix Lancets Misc Check fasting blood sugar once daily   allopurinol 300 MG tablet Commonly known as: ZYLOPRIM Take 0.5 tablets (150 mg total) by mouth daily.   amLODipine 5 MG tablet Commonly known as: NORVASC Take 1 tablet (5 mg total) by mouth daily.   apixaban 2.5 MG  Tabs tablet Commonly known as: Eliquis Take 1 tablet (2.5 mg total) by mouth 2 (two) times daily.   atorvastatin 20 MG tablet Commonly known as: LIPITOR Take 1 tablet (20 mg total) by mouth at bedtime.   carvedilol 6.25 MG tablet Commonly known as: COREG Take 1 tablet (6.25 mg total) by mouth 2 (two) times daily with a meal.   cholecalciferol 1000 units tablet Commonly known as: VITAMIN D Take 1,000 Units by mouth daily.   CHROMIUM GTF PO Take 1 capsule by mouth every evening.   Cinnamon 500 MG Tabs Take 2 tablets by mouth at bedtime.   Co Q 10 100 MG Caps Take 100 mg by mouth daily.   dicyclomine 10 MG capsule Commonly known as: BENTYL Take 1 capsule (10 mg total) by mouth 4 (four) times daily -  before meals and at bedtime. What changed: when to take this   diphenoxylate-atropine 2.5-0.025 MG tablet Commonly known as: LOMOTIL   finasteride 5 MG tablet Commonly known as: PROSCAR Take 5 mg by mouth daily.   fish oil-omega-3 fatty acids 1000 MG capsule Take 1 g by mouth daily.   furosemide 20 MG tablet Commonly known as: LASIX Take 1 tablet (20 mg total) by mouth as directed.   glucose blood test strip Commonly known as: Accu-Chek Guide Check fasting blood sugar once  daily   latanoprost 0.005 % ophthalmic solution Commonly known as: XALATAN Place 1 drop into both eyes at bedtime.   potassium chloride 10 MEQ tablet Commonly known as: K-DUR Take 1 tablet (10 mEq total) by mouth daily. What changed: when to take this   PRESERVISION AREDS PO Take 1 tablet by mouth 2 (two) times daily.   PROBIOTIC PO Take 1 tablet by mouth daily.   tamsulosin 0.4 MG Caps capsule Commonly known as: FLOMAX TAKE 1 CAPSULE BY MOUTH ONCE DAILY AFTER SUPPER What changed: See the new instructions.   timolol 0.5 % ophthalmic solution Commonly known as: TIMOPTIC   VANADYL SULFATE PO Take 1 capsule by mouth every evening.   zinc gluconate 50 MG tablet Take 50 mg by mouth  daily.       Allergies:  Allergies  Allergen Reactions  . Penicillins Hives    Has patient had a PCN reaction causing immediate rash, facial/tongue/throat swelling, SOB or lightheadedness with hypotension: No Has patient had a PCN reaction causing severe rash involving mucus membranes or skin necrosis: Yes Has patient had a PCN reaction that required hospitalization: No Has patient had a PCN reaction occurring within the last 10 years: No If all of the above answers are "NO", then may proceed with Cephalosporin use.   . Vancomycin Other (See Comments)    Edema and myalgia    Past Medical History, Surgical history, Social history, and Family History were reviewed and updated.  Review of Systems: Review of Systems  Constitutional: Negative.   HENT: Negative.   Eyes: Negative.   Respiratory: Negative.   Cardiovascular: Positive for palpitations.  Gastrointestinal: Negative.   Genitourinary: Negative.   Musculoskeletal: Negative.   Skin: Negative.   Neurological: Negative.   Endo/Heme/Allergies: Negative.   Psychiatric/Behavioral: Negative.      Physical Exam:  vitals were not taken for this visit.   Wt Readings from Last 3 Encounters:  08/26/18 154 lb (69.9 kg)  08/05/18 155 lb (70.3 kg)  07/08/18 151 lb 8 oz (68.7 kg)    Physical Exam Vitals signs reviewed.  HENT:     Head: Normocephalic and atraumatic.  Eyes:     Pupils: Pupils are equal, round, and reactive to light.  Neck:     Musculoskeletal: Normal range of motion.  Cardiovascular:     Rate and Rhythm: Normal rate and regular rhythm.     Heart sounds: Normal heart sounds.  Pulmonary:     Effort: Pulmonary effort is normal.     Breath sounds: Normal breath sounds.  Abdominal:     General: Bowel sounds are normal.     Palpations: Abdomen is soft.  Musculoskeletal: Normal range of motion.        General: No tenderness or deformity.  Lymphadenopathy:     Cervical: No cervical adenopathy.  Skin:     General: Skin is warm and dry.     Findings: No erythema or rash.  Neurological:     Mental Status: He is alert and oriented to person, place, and time.  Psychiatric:        Behavior: Behavior normal.        Thought Content: Thought content normal.        Judgment: Judgment normal.      Lab Results  Component Value Date   WBC 6.9 10/07/2018   HGB 10.7 (L) 10/07/2018   HCT 32.7 (L) 10/07/2018   MCV 93.7 10/07/2018   PLT 146 (L) 10/07/2018   Lab Results  Component Value Date   FERRITIN 385 (H) 08/26/2018   IRON 88 08/26/2018   TIBC 251 08/26/2018   UIBC 164 08/26/2018   IRONPCTSAT 35 08/26/2018   Lab Results  Component Value Date   RETICCTPCT 1.5 08/26/2018   RBC 3.49 (L) 10/07/2018   No results found for: KPAFRELGTCHN, LAMBDASER, KAPLAMBRATIO No results found for: Kandis Cocking, IGMSERUM Lab Results  Component Value Date   TOTALPROTELP 6.0 (L) 12/26/2015   ALBUMINELP 3.4 (L) 12/26/2015   A1GS 0.4 (H) 12/26/2015   A2GS 0.8 12/26/2015   BETS 0.4 12/26/2015   BETA2SER 0.3 12/26/2015   GAMS 0.8 12/26/2015   MSPIKE Not Observed 01/29/2016   SPEI SEE NOTE 12/26/2015     Chemistry      Component Value Date/Time   NA 139 10/07/2018 1054   NA 141 08/11/2017 0924   NA 145 04/09/2017 0905   NA 140 03/28/2016 0853   K 4.8 10/07/2018 1054   K 4.5 04/09/2017 0905   K 4.4 03/28/2016 0853   CL 108 10/07/2018 1054   CL 105 04/09/2017 0905   CO2 23 10/07/2018 1054   CO2 27 04/09/2017 0905   CO2 19 (L) 03/28/2016 0853   BUN 37 (H) 10/07/2018 1054   BUN 27 08/11/2017 0924   BUN 33 (H) 04/09/2017 0905   BUN 34.4 (H) 03/28/2016 0853   CREATININE 1.70 (H) 10/07/2018 1054   CREATININE 1.72 (H) 08/18/2017 1543   CREATININE 1.7 (H) 03/28/2016 0853   GLU 113 07/24/2016      Component Value Date/Time   CALCIUM 9.1 10/07/2018 1054   CALCIUM 9.2 04/09/2017 0905   CALCIUM 9.2 03/28/2016 0853   ALKPHOS 55 10/07/2018 1054   ALKPHOS 69 04/09/2017 0905   ALKPHOS 85 03/28/2016  0853   AST 41 10/07/2018 1054   AST 23 03/28/2016 0853   ALT 51 (H) 10/07/2018 1054   ALT 27 04/09/2017 0905   ALT 37 03/28/2016 0853   BILITOT 0.7 10/07/2018 1054   BILITOT 0.79 03/28/2016 0853      Impression and Plan: Ms. Holtman is a very pleasant 85 yo caucasian gentleman with both erythropoietin deficiency and iron deficiency anemia.   He does need  Aranesp today.  We will see what his iron studies look like.  I will plan to get him back in 8 weeks.  If he has any problems between now and then, hopefully, he will see me in church if we can finally go to church despite the coronavirus pandemic.   [name], MD 6/11/202011:34 AM

## 2018-10-07 NOTE — Patient Instructions (Signed)

## 2018-10-08 LAB — IRON AND TIBC
Iron: 91 ug/dL (ref 42–163)
Saturation Ratios: 36 % (ref 20–55)
TIBC: 250 ug/dL (ref 202–409)
UIBC: 159 ug/dL (ref 117–376)

## 2018-10-08 LAB — FERRITIN: Ferritin: 423 ng/mL — ABNORMAL HIGH (ref 24–336)

## 2018-10-22 ENCOUNTER — Encounter: Payer: Medicare Other | Admitting: Internal Medicine

## 2018-10-23 ENCOUNTER — Other Ambulatory Visit: Payer: Self-pay | Admitting: Gastroenterology

## 2018-11-01 ENCOUNTER — Ambulatory Visit (INDEPENDENT_AMBULATORY_CARE_PROVIDER_SITE_OTHER): Payer: Medicare Other | Admitting: *Deleted

## 2018-11-01 DIAGNOSIS — I441 Atrioventricular block, second degree: Secondary | ICD-10-CM

## 2018-11-01 LAB — CUP PACEART REMOTE DEVICE CHECK
Date Time Interrogation Session: 20200706154258
Implantable Lead Implant Date: 20190402
Implantable Lead Implant Date: 20190402
Implantable Lead Location: 753859
Implantable Lead Location: 753860
Implantable Pulse Generator Implant Date: 20190402
Pulse Gen Model: 2272
Pulse Gen Serial Number: 9003454

## 2018-11-02 DIAGNOSIS — E119 Type 2 diabetes mellitus without complications: Secondary | ICD-10-CM | POA: Diagnosis not present

## 2018-11-09 ENCOUNTER — Telehealth: Payer: Self-pay

## 2018-11-09 DIAGNOSIS — H35423 Microcystoid degeneration of retina, bilateral: Secondary | ICD-10-CM | POA: Diagnosis not present

## 2018-11-09 DIAGNOSIS — H353134 Nonexudative age-related macular degeneration, bilateral, advanced atrophic with subfoveal involvement: Secondary | ICD-10-CM | POA: Diagnosis not present

## 2018-11-09 DIAGNOSIS — H35433 Paving stone degeneration of retina, bilateral: Secondary | ICD-10-CM | POA: Diagnosis not present

## 2018-11-09 DIAGNOSIS — H43813 Vitreous degeneration, bilateral: Secondary | ICD-10-CM | POA: Diagnosis not present

## 2018-11-09 NOTE — Telephone Encounter (Signed)
I would advise him to be virtual or telephone if possible.

## 2018-11-09 NOTE — Telephone Encounter (Signed)
-----   Message from [place] sent at 11/09/2018  2:59 PM EDT ----- I haven't called yet. Because of his age, Raoul Pitch needs to "approve" him to come in, of course, if no covid symptoms. Come see me when you get a chance.  ----- Message ----- From: Caroll Rancher, LPN Sent: 07/18/5670   2:49 PM EDT To: Edwinna Areola Hopkins  Have we called and asked the patient if he wants to come in? He may demand to come in or say he is not coming.   ----- Message ----- From: Edmonia Caprio Sent: 11/09/2018   2:41 PM EDT To: Caroll Rancher, LPN  85 year old scheduled on 7/16 - 6 month follow up HTN. Does Dr. Raoul Pitch want to see him in office or virtual visit.  Thank you Hinton Dyer

## 2018-11-09 NOTE — Telephone Encounter (Signed)
Please advise if this patient may come into the office- 85 year old scheduled on 7/16 - 6 month follow up HTN.

## 2018-11-10 ENCOUNTER — Encounter: Payer: Self-pay | Admitting: Cardiology

## 2018-11-10 NOTE — Progress Notes (Signed)
Remote pacemaker transmission.   

## 2018-11-10 NOTE — Telephone Encounter (Signed)
lmom for patient to return my call to schedule virtual visit

## 2018-11-11 ENCOUNTER — Encounter: Payer: Self-pay | Admitting: Family Medicine

## 2018-11-11 ENCOUNTER — Other Ambulatory Visit: Payer: Self-pay

## 2018-11-11 ENCOUNTER — Ambulatory Visit (INDEPENDENT_AMBULATORY_CARE_PROVIDER_SITE_OTHER): Payer: Medicare Other | Admitting: Family Medicine

## 2018-11-11 VITALS — BP 133/55 | HR 76 | Temp 97.7°F | Ht 68.0 in | Wt 154.2 lb

## 2018-11-11 DIAGNOSIS — K580 Irritable bowel syndrome with diarrhea: Secondary | ICD-10-CM | POA: Diagnosis not present

## 2018-11-11 DIAGNOSIS — D500 Iron deficiency anemia secondary to blood loss (chronic): Secondary | ICD-10-CM

## 2018-11-11 DIAGNOSIS — E7800 Pure hypercholesterolemia, unspecified: Secondary | ICD-10-CM | POA: Diagnosis not present

## 2018-11-11 DIAGNOSIS — E7849 Other hyperlipidemia: Secondary | ICD-10-CM

## 2018-11-11 DIAGNOSIS — D508 Other iron deficiency anemias: Secondary | ICD-10-CM

## 2018-11-11 DIAGNOSIS — I480 Paroxysmal atrial fibrillation: Secondary | ICD-10-CM | POA: Diagnosis not present

## 2018-11-11 DIAGNOSIS — I25708 Atherosclerosis of coronary artery bypass graft(s), unspecified, with other forms of angina pectoris: Secondary | ICD-10-CM

## 2018-11-11 DIAGNOSIS — I10 Essential (primary) hypertension: Secondary | ICD-10-CM

## 2018-11-11 DIAGNOSIS — I25810 Atherosclerosis of coronary artery bypass graft(s) without angina pectoris: Secondary | ICD-10-CM

## 2018-11-11 DIAGNOSIS — M109 Gout, unspecified: Secondary | ICD-10-CM

## 2018-11-11 DIAGNOSIS — D631 Anemia in chronic kidney disease: Secondary | ICD-10-CM | POA: Diagnosis not present

## 2018-11-11 DIAGNOSIS — I5032 Chronic diastolic (congestive) heart failure: Secondary | ICD-10-CM

## 2018-11-11 MED ORDER — ATORVASTATIN CALCIUM 20 MG PO TABS: 20.0000 mg | ORAL_TABLET | Freq: Every day | ORAL | 3 refills | Status: DC

## 2018-11-11 MED ORDER — CARVEDILOL 6.25 MG PO TABS: 6.2500 mg | ORAL_TABLET | Freq: Two times a day (BID) | ORAL | 1 refills | Status: DC

## 2018-11-11 MED ORDER — POTASSIUM CHLORIDE ER 10 MEQ PO TBCR: 10.0000 meq | EXTENDED_RELEASE_TABLET | Freq: Two times a day (BID) | ORAL | 3 refills | Status: DC

## 2018-11-11 MED ORDER — ALLOPURINOL 300 MG PO TABS: 150.0000 mg | ORAL_TABLET | Freq: Every day | ORAL | 3 refills | Status: DC

## 2018-11-11 NOTE — Progress Notes (Signed)
VIRTUAL VISIT VIA VIDEO  I connected with [name] on 11/11/18 at 10:00 AM EDT by a video enabled telemedicine application and verified that I am speaking with the correct person using two identifiers. Location patient: Home Location provider: [facility], Office Persons participating in the virtual visit: Patient, Dr. Raoul Pitch and R.Baker, LPN  I discussed the limitations of evaluation and management by telemedicine and the availability of in person appointments. The patient expressed understanding and agreed to proceed.   [name] , [birth year], 85 y.o., male MRN: [id number] Patient Care Team    Relationship Specialty Notifications Start End  [name], DO PCP - General Family Medicine  12/18/15   [name], MD PCP - Cardiology Cardiology  03/17/17   [name], MD Consulting Physician Ophthalmology  12/18/15   [name], MD Consulting Physician Dermatology  12/18/15   [name], MD Consulting Physician Cardiology  12/18/15   [name], MD Consulting Physician Ophthalmology  12/19/15   [name], MD Consulting Physician Oncology  09/30/16   [name], MD Attending Physician Nephrology  09/30/16   [name], MD Consulting Physician Dermatology  09/30/16     Chief Complaint  Patient presents with  . Hypertension    Pt needs refills on Lomotil. Takes BP at home. BP med taken at 0730am, took BP at 0850.     Subjective:  Hypertension/CKD3/A.Fib/IDA Patient reports compliance with  Coreg 6.25  twice a day and amlodipine 5 mg QD, lasix about weekly use needed.  Patient reports compliance with Eliquis 2.5 mg twice a day. He takes fish oil supplementation of 1000 mg daily. Lipitor 20 mg daily.  Patient denies chest pain, shortness of breath, dizziness or lower extremity edema. He reports he is feeling pretty good.  CMP: 10/07/2018-potassium 4.8, creatinine 1.7 (baseline 1.6) GFR 35 CBC: 10/07/2018 hemoglobin 10.7/hematocrit  32.7-baseline approximately 11.5. Lipid: 06/21/2017 within normal limits PTH/calcium/vitamin D: 01/15/2017 within normal limits--recheck next visit since virtual today Diet: Low sodium diet followed Exercise: very active RF: CKD3, HLD, CAD w/ CABG (1999), cardiac cath 2011 2/2 CP (all grafts normal), PVD, Afib Significant cardiac history of CAD with prior CABG in 1999 -LIMA to the LAD, SVG to IM, OM1, OM 2, SVG to D1, SVG to PDA/PLA, EF 45-50% with mild diffuse hypokinesis. Grafts patent by cath 2011 Nuclear stress test 08/2015 scar, no ischemia LVEF 70% also has atrial fibrillation/flutter in 2014 on Eliquis status post [facility] 10/2014, PVD, claudication.  Cardioversion 04/01/2017.  Pacemaker implant 07/28/2017.  Vascular carotid duplex 10/02/2017: "Mild carotid plaque bilaterally continue with prevention efforts "  Echo 04/03/17  EF 27-03%  Grade 2 diastolic  Mild to moderate MR Moderate LAE mild RAE   Allergies  Allergen Reactions  . Penicillins Hives    Has patient had a PCN reaction causing immediate rash, facial/tongue/throat swelling, SOB or lightheadedness with hypotension: No Has patient had a PCN reaction causing severe rash involving mucus membranes or skin necrosis: Yes Has patient had a PCN reaction that required hospitalization: No Has patient had a PCN reaction occurring within the last 10 years: No If all of the above answers are "NO", then may proceed with Cephalosporin use.   . Vancomycin Other (See Comments)    Edema and myalgia   Social History   Tobacco Use  . Smoking status: Former Smoker    Quit date: 12/25/1961    Years since quitting: 56.9  . Smokeless tobacco: Former Systems developer  Types: Sarina Ser    Quit date: 02/03/1969  Substance Use Topics  . Alcohol use: No    Alcohol/week: 0.0 standard drinks   Past Medical History:  Diagnosis Date  . A-fib ([place])   . Basal cell carcinoma    skin  . Bigeminal rhythm   . Cataract   . Chronic renal insufficiency, stage 3  (moderate) (HCC) 2018   GFR 30s-40s  . Coronary artery disease    post bypass  . CVD (cardiovascular disease)   . Diabetes mellitus without complication ([place])   . Diverticular disease   . Easy bruising   . 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 Gout   . Hernia   . Hyperlipidemia   . Hypertension   . IBS (irritable bowel syndrome)   . Iron deficiency anemia 02/04/2016  . Macular degeneration   . Microscopic colitis   . PVD (peripheral vascular disease) ([place])    Past Surgical History:  Procedure Laterality Date  . CARDIAC CATHETERIZATION  2006  . CARDIOVERSION N/A 02/22/2013   Procedure: CARDIOVERSION;  Surgeon: [name], MD;  Location: [facility] ENDOSCOPY;  Service: Cardiovascular;  Laterality: N/A;  . CARDIOVERSION N/A 11/15/2014   Procedure: CARDIOVERSION;  Surgeon: [name], MD;  Location: [facility] ENDOSCOPY;  Service: Cardiovascular;  Laterality: N/A;  . CARDIOVERSION N/A 04/01/2017   Procedure: CARDIOVERSION;  Surgeon: Larey Dresser, MD;  Location: [place];  Service: Cardiovascular;  Laterality: N/A;  . CAROTID ENDARTERECTOMY  2009/ 1993   left/ right  . COLONOSCOPY  03/17/2005   The colon is normal.  . CORONARY ARTERY BYPASS GRAFT  1999  . ESOPHAGOGASTRODUODENOSCOPY  03/17/2005   Normal esophagus. Normal Stomanch. Normal duodenum.   Marland Kitchen EYE SURGERY     eyelid repair  . INGUINAL HERNIA REPAIR  02/11/2012   Procedure: LAPAROSCOPIC BILATERAL INGUINAL HERNIA REPAIR;  Surgeon: Pedro Earls, MD;  Location: [facility] ORS;  Service: General;  Laterality: Bilateral;  . PACEMAKER IMPLANT N/A 07/28/2017   [employer] Jude Medical Assurity MRI conditional  dual-chamber pacemaker for symptomatic second degree AV block by Dr Rayann Heman  . SKIN CANCER EXCISION     right ear x 3   Family History  Problem Relation Age of Onset  . Stroke Mother   . Coronary artery disease Father   . Heart disease Father   . Melanoma Sister   . Colon cancer Neg Hx    Allergies as of 11/11/2018       Reactions   Penicillins Hives   Has patient had a PCN reaction causing immediate rash, facial/tongue/throat swelling, SOB or lightheadedness with hypotension: No Has patient had a PCN reaction causing severe rash involving mucus membranes or skin necrosis: Yes Has patient had a PCN reaction that required hospitalization: No Has patient had a PCN reaction occurring within the last 10 years: No If all of the above answers are "NO", then may proceed with Cephalosporin use.   Vancomycin Other (See Comments)   Edema and myalgia      Medication List       Accurate as of November 11, 2018 10:10 AM. If you have any questions, ask your nurse or doctor.        Accu-Chek FastClix Lancets Misc Check fasting blood sugar once daily   allopurinol 300 MG tablet Commonly known as: ZYLOPRIM Take 0.5 tablets (150 mg total) by mouth daily.   amLODipine 5 MG tablet Commonly known as: NORVASC Take 1 tablet (5 mg total) by mouth daily.   apixaban  2.5 MG Tabs tablet Commonly known as: Eliquis Take 1 tablet (2.5 mg total) by mouth 2 (two) times daily.   atorvastatin 20 MG tablet Commonly known as: LIPITOR Take 1 tablet (20 mg total) by mouth at bedtime.   carvedilol 6.25 MG tablet Commonly known as: COREG Take 1 tablet (6.25 mg total) by mouth 2 (two) times daily with a meal.   cholecalciferol 1000 units tablet Commonly known as: VITAMIN D Take 1,000 Units by mouth daily.   CHROMIUM GTF PO Take 1 capsule by mouth every evening.   Cinnamon 500 MG Tabs Take 2 tablets by mouth at bedtime.   Co Q 10 100 MG Caps Take 100 mg by mouth daily.   dicyclomine 10 MG capsule Commonly known as: BENTYL Take 1 capsule (10 mg total) by mouth 4 (four) times daily -  before meals and at bedtime. What changed: when to take this   diphenoxylate-atropine 2.5-0.025 MG tablet Commonly known as: LOMOTIL 1 tablet. Pt takes PRN   finasteride 5 MG tablet Commonly known as: PROSCAR Take 5 mg by mouth daily.    fish oil-omega-3 fatty acids 1000 MG capsule Take 1 g by mouth daily.   furosemide 20 MG tablet Commonly known as: LASIX Take 1 tablet (20 mg total) by mouth as directed.   glucose blood test strip Commonly known as: Accu-Chek Guide Check fasting blood sugar once daily   latanoprost 0.005 % ophthalmic solution Commonly known as: XALATAN Place 1 drop into both eyes at bedtime.   potassium chloride 10 MEQ tablet Commonly known as: K-DUR Take 1 tablet (10 mEq total) by mouth daily. What changed: when to take this   PRESERVISION AREDS PO Take 1 tablet by mouth 2 (two) times daily.   PROBIOTIC PO Take 1 tablet by mouth daily.   tamsulosin 0.4 MG Caps capsule Commonly known as: FLOMAX TAKE 1 CAPSULE BY MOUTH ONCE DAILY AFTER SUPPER What changed: See the new instructions.   timolol 0.5 % ophthalmic solution Commonly known as: TIMOPTIC   VANADYL SULFATE PO Take 1 capsule by mouth every evening.   zinc gluconate 50 MG tablet Take 50 mg by mouth daily.       No results found for this or any previous visit (from the past 24 hour(s)). No results found.   ROS: Negative, with the exception of above mentioned in HPI  Objective:  BP (!) 133/55   Pulse 76   Temp 97.7 F (36.5 C) (Oral)   Ht 5\' 8"  (1.727 m)   Wt 154 lb 4 oz (70 kg)   BMI 23.45 kg/m  Body mass index is 23.45 kg/m.  Gen: Afebrile. No acute distress.  Nontoxic in presentation.  Appears well.  Very pleasant Caucasian male. HENT: AT. [place].  MMM.  Eyes:Pupils Equal Round Reactive to light, Extraocular movements intact,  Conjunctiva without redness, discharge or icterus. CV: No edema Chest: No cough or shortness of breath Neuro:  Normal gait. PERLA. EOMi. Alert. Oriented x3    Assessment/Plan: PAX REASONER is a 85 y.o. male present for OV for  Essential hypertension/Paroxysmal atrial fibrillation (HCC)/Stage 3 chronic kidney disease/HLD//CHF/CAD/PVD - stable.  - Continue amlodipine 5 mg daily,  Lipitor 20 mg daily,Coreg 6.25 twice daily.   -  Continue Lasix 20 mg PRN- using about once weekly.  QD PRN depending upon weight and fluid ( 3lb & fluid rule).  - continue  fish oil supplementation, Eliquis 2.5 mg twice daily, - continue potassium 1 tab daily, 2 tabs  Qd if using lasix that day.  -  he  has difficulty controlling potassium on ace/arb- so lisinopril was dc'd in the past 2/2 to hyperkalemia - now he is on a potasium supplement daily to maintain levels. Refilled Kdur 10 BID.  - Low-sodium diet.  -  He BP has been  very difficult to control at times and his hydration and IDA/epo injections cause fluctuations leading to both highs and lower than desired readings. Certainly would rather mildly elevated BP over too low.  Labs: PTH/vit d/calcium due next visit.  - Follow-up 6 months, as long as seeing Dr. Johnsie Cancel routinely as well.  Anemias IDA and EPO:  - managed by heme/onc and doing well- reviewed recent last labs  > 15 minutes spent with patient, > 50% of that time face to face    electronically signed by:  Howard Pouch, DO  [place]

## 2018-11-15 ENCOUNTER — Encounter: Payer: Self-pay | Admitting: Family Medicine

## 2018-11-18 ENCOUNTER — Other Ambulatory Visit: Payer: Self-pay | Admitting: Gastroenterology

## 2018-11-22 DIAGNOSIS — R351 Nocturia: Secondary | ICD-10-CM | POA: Diagnosis not present

## 2018-11-22 DIAGNOSIS — N401 Enlarged prostate with lower urinary tract symptoms: Secondary | ICD-10-CM | POA: Diagnosis not present

## 2018-12-09 ENCOUNTER — Inpatient Hospital Stay: Payer: Medicare Other

## 2018-12-09 ENCOUNTER — Encounter: Payer: Self-pay | Admitting: Hematology & Oncology

## 2018-12-09 ENCOUNTER — Other Ambulatory Visit: Payer: Self-pay

## 2018-12-09 ENCOUNTER — Inpatient Hospital Stay: Payer: Medicare Other | Attending: Hematology & Oncology | Admitting: Hematology & Oncology

## 2018-12-09 VITALS — BP 152/70 | HR 78 | Temp 97.1°F | Resp 18 | Wt 152.0 lb

## 2018-12-09 DIAGNOSIS — N189 Chronic kidney disease, unspecified: Secondary | ICD-10-CM | POA: Insufficient documentation

## 2018-12-09 DIAGNOSIS — D631 Anemia in chronic kidney disease: Secondary | ICD-10-CM

## 2018-12-09 DIAGNOSIS — D500 Iron deficiency anemia secondary to blood loss (chronic): Secondary | ICD-10-CM

## 2018-12-09 DIAGNOSIS — D509 Iron deficiency anemia, unspecified: Secondary | ICD-10-CM | POA: Diagnosis not present

## 2018-12-09 DIAGNOSIS — Z79899 Other long term (current) drug therapy: Secondary | ICD-10-CM | POA: Diagnosis not present

## 2018-12-09 DIAGNOSIS — I25810 Atherosclerosis of coronary artery bypass graft(s) without angina pectoris: Secondary | ICD-10-CM

## 2018-12-09 LAB — CMP (CANCER CENTER ONLY)
ALT: 32 U/L (ref 0–44)
AST: 25 U/L (ref 15–41)
Albumin: 4.1 g/dL (ref 3.5–5.0)
Alkaline Phosphatase: 63 U/L (ref 38–126)
Anion gap: 7 (ref 5–15)
BUN: 31 mg/dL — ABNORMAL HIGH (ref 8–23)
CO2: 23 mmol/L (ref 22–32)
Calcium: 8.7 mg/dL — ABNORMAL LOW (ref 8.9–10.3)
Chloride: 108 mmol/L (ref 98–111)
Creatinine: 1.73 mg/dL — ABNORMAL HIGH (ref 0.61–1.24)
GFR, Est AFR Am: 40 mL/min — ABNORMAL LOW (ref >60)
GFR, Estimated: 34 mL/min — ABNORMAL LOW (ref >60)
Glucose, Bld: 107 mg/dL — ABNORMAL HIGH (ref 70–99)
Potassium: 4.9 mmol/L (ref 3.5–5.1)
Sodium: 138 mmol/L (ref 135–145)
Total Bilirubin: 0.5 mg/dL (ref 0.3–1.2)
Total Protein: 6.6 g/dL (ref 6.5–8.1)

## 2018-12-09 LAB — CBC WITH DIFFERENTIAL (CANCER CENTER ONLY)
Abs Immature Granulocytes: 0.02 10*3/uL (ref 0.00–0.07)
Basophils Absolute: 0 10*3/uL (ref 0.0–0.1)
Basophils Relative: 0 %
Eosinophils Absolute: 0.1 10*3/uL (ref 0.0–0.5)
Eosinophils Relative: 2 %
HCT: 37.2 % — ABNORMAL LOW (ref 39.0–52.0)
Hemoglobin: 12.5 g/dL — ABNORMAL LOW (ref 13.0–17.0)
Immature Granulocytes: 0 %
Lymphocytes Relative: 28 %
Lymphs Abs: 2.2 10*3/uL (ref 0.7–4.0)
MCH: 30.6 pg (ref 26.0–34.0)
MCHC: 33.6 g/dL (ref 30.0–36.0)
MCV: 91 fL (ref 80.0–100.0)
Monocytes Absolute: 0.7 10*3/uL (ref 0.1–1.0)
Monocytes Relative: 9 %
Neutro Abs: 4.7 10*3/uL (ref 1.7–7.7)
Neutrophils Relative %: 61 %
Platelet Count: 174 10*3/uL (ref 150–400)
RBC: 4.09 MIL/uL — ABNORMAL LOW (ref 4.22–5.81)
RDW: 14.4 % (ref 11.5–15.5)
WBC Count: 7.8 10*3/uL (ref 4.0–10.5)
nRBC: 0 % (ref 0.0–0.2)

## 2018-12-09 NOTE — Progress Notes (Signed)
Hematology and Oncology Follow Up Visit  [name] [id number] 1928-10-26 85 y.o. 12/09/2018   Principle Diagnosis:  Erythropoietin deficiency anemia Iron deficiency anemia  Current Therapy:   IV iron as indicated Aranesp 300 g subcutaneous as needed for hemoglobin less than 11 - last dose given on  10/07/2018   Interim History:  [name] is here today for follow-up.  He is doing quite well.  He feels well.  We did give him Aranesp last we saw him.  This seemed to work quite well for him.  His hemoglobin went up to 12.5 today.  Is been no problems with bleeding.  He is on low-dose Eliquis.  He has had no issues with his kidneys.  He has a stable creatinine at 1.73.  There is been no issues with cough or shortness of breath.  He has had no problems with fever.  There has been no obvious change in bowel or bladder habits.  Overall, his performance status is ECOG 2.  Medications:  Allergies as of 12/09/2018      Reactions   Penicillins Hives   Has patient had a PCN reaction causing immediate rash, facial/tongue/throat swelling, SOB or lightheadedness with hypotension: No Has patient had a PCN reaction causing severe rash involving mucus membranes or skin necrosis: Yes Has patient had a PCN reaction that required hospitalization: No Has patient had a PCN reaction occurring within the last 10 years: No If all of the above answers are "NO", then may proceed with Cephalosporin use.   Vancomycin Other (See Comments)   Edema and myalgia      Medication List       Accurate as of December 09, 2018 12:59 PM. If you have any questions, ask your nurse or doctor.        Accu-Chek FastClix Lancets Misc Check fasting blood sugar once daily   allopurinol 300 MG tablet Commonly known as: ZYLOPRIM Take 0.5 tablets (150 mg total) by mouth daily.   amLODipine 5 MG tablet Commonly known as: NORVASC Take 1 tablet (5 mg total) by mouth daily.   apixaban 2.5 MG Tabs tablet  Commonly known as: Eliquis Take 1 tablet (2.5 mg total) by mouth 2 (two) times daily.   atorvastatin 20 MG tablet Commonly known as: LIPITOR Take 1 tablet (20 mg total) by mouth at bedtime.   carvedilol 6.25 MG tablet Commonly known as: COREG Take 1 tablet (6.25 mg total) by mouth 2 (two) times daily with a meal.   cholecalciferol 1000 units tablet Commonly known as: VITAMIN D Take 1,000 Units by mouth daily.   CHROMIUM GTF PO Take 1 capsule by mouth every evening.   Cinnamon 500 MG Tabs Take 2 tablets by mouth at bedtime.   Co Q 10 100 MG Caps Take 100 mg by mouth daily.   dicyclomine 10 MG capsule Commonly known as: BENTYL Take 1 capsule (10 mg total) by mouth 4 (four) times daily -  before meals and at bedtime. What changed: when to take this   diphenoxylate-atropine 2.5-0.025 MG tablet Commonly known as: LOMOTIL 1 tablet. Pt takes PRN   finasteride 5 MG tablet Commonly known as: PROSCAR Take 5 mg by mouth daily.   fish oil-omega-3 fatty acids 1000 MG capsule Take 1 g by mouth daily.   furosemide 20 MG tablet Commonly known as: LASIX Take 1 tablet (20 mg total) by mouth as directed.   glucose blood test strip Commonly known as: Accu-Chek Guide Check fasting blood sugar once  daily   latanoprost 0.005 % ophthalmic solution Commonly known as: XALATAN Place 1 drop into both eyes at bedtime.   potassium chloride 10 MEQ tablet Commonly known as: K-DUR Take 1 tablet (10 mEq total) by mouth 2 (two) times daily.   potassium chloride 10 MEQ tablet Commonly known as: K-DUR Take 10 mEq by mouth.   PRESERVISION AREDS PO Take 1 tablet by mouth 2 (two) times daily.   PROBIOTIC PO Take 1 tablet by mouth daily.   tamsulosin 0.4 MG Caps capsule Commonly known as: FLOMAX TAKE 1 CAPSULE BY MOUTH ONCE DAILY AFTER SUPPER What changed: See the new instructions.   timolol 0.5 % ophthalmic solution Commonly known as: TIMOPTIC   VANADYL SULFATE PO Take 1 capsule  by mouth every evening.   zinc gluconate 50 MG tablet Take 50 mg by mouth daily.       Allergies:  Allergies  Allergen Reactions  . Penicillins Hives    Has patient had a PCN reaction causing immediate rash, facial/tongue/throat swelling, SOB or lightheadedness with hypotension: No Has patient had a PCN reaction causing severe rash involving mucus membranes or skin necrosis: Yes Has patient had a PCN reaction that required hospitalization: No Has patient had a PCN reaction occurring within the last 10 years: No If all of the above answers are "NO", then may proceed with Cephalosporin use.   . Vancomycin Other (See Comments)    Edema and myalgia    Past Medical History, Surgical history, Social history, and Family History were reviewed and updated.  Review of Systems: Review of Systems  Constitutional: Negative.   HENT: Negative.   Eyes: Negative.   Respiratory: Negative.   Cardiovascular: Positive for palpitations.  Gastrointestinal: Negative.   Genitourinary: Negative.   Musculoskeletal: Negative.   Skin: Negative.   Neurological: Negative.   Endo/Heme/Allergies: Negative.   Psychiatric/Behavioral: Negative.      Physical Exam:  weight is 152 lb (68.9 kg). His oral temperature is 97.1 F (36.2 C) (abnormal). His blood pressure is 152/70 (abnormal) and his pulse is 78. His respiration is 18 and oxygen saturation is 100%.   Wt Readings from Last 3 Encounters:  12/09/18 152 lb (68.9 kg)  11/11/18 154 lb 4 oz (70 kg)  10/07/18 155 lb (70.3 kg)    Physical Exam Vitals signs reviewed.  HENT:     Head: Normocephalic and atraumatic.  Eyes:     Pupils: Pupils are equal, round, and reactive to light.  Neck:     Musculoskeletal: Normal range of motion.  Cardiovascular:     Rate and Rhythm: Normal rate and regular rhythm.     Heart sounds: Normal heart sounds.  Pulmonary:     Effort: Pulmonary effort is normal.     Breath sounds: Normal breath sounds.  Abdominal:      General: Bowel sounds are normal.     Palpations: Abdomen is soft.  Musculoskeletal: Normal range of motion.        General: No tenderness or deformity.  Lymphadenopathy:     Cervical: No cervical adenopathy.  Skin:    General: Skin is warm and dry.     Findings: No erythema or rash.  Neurological:     Mental Status: He is alert and oriented to person, place, and time.  Psychiatric:        Behavior: Behavior normal.        Thought Content: Thought content normal.        Judgment: Judgment normal.  Lab Results  Component Value Date   WBC 7.8 12/09/2018   HGB 12.5 (L) 12/09/2018   HCT 37.2 (L) 12/09/2018   MCV 91.0 12/09/2018   PLT 174 12/09/2018   Lab Results  Component Value Date   FERRITIN 423 (H) 10/07/2018   IRON 91 10/07/2018   TIBC 250 10/07/2018   UIBC 159 10/07/2018   IRONPCTSAT 36 10/07/2018   Lab Results  Component Value Date   RETICCTPCT 1.5 08/26/2018   RBC 4.09 (L) 12/09/2018   No results found for: KPAFRELGTCHN, LAMBDASER, KAPLAMBRATIO No results found for: Kandis Cocking, IGMSERUM Lab Results  Component Value Date   TOTALPROTELP 6.0 (L) 12/26/2015   ALBUMINELP 3.4 (L) 12/26/2015   A1GS 0.4 (H) 12/26/2015   A2GS 0.8 12/26/2015   BETS 0.4 12/26/2015   BETA2SER 0.3 12/26/2015   GAMS 0.8 12/26/2015   MSPIKE Not Observed 01/29/2016   SPEI SEE NOTE 12/26/2015     Chemistry      Component Value Date/Time   NA 138 12/09/2018 1148   NA 141 08/11/2017 0924   NA 145 04/09/2017 0905   NA 140 03/28/2016 0853   K 4.9 12/09/2018 1148   K 4.5 04/09/2017 0905   K 4.4 03/28/2016 0853   CL 108 12/09/2018 1148   CL 105 04/09/2017 0905   CO2 23 12/09/2018 1148   CO2 27 04/09/2017 0905   CO2 19 (L) 03/28/2016 0853   BUN 31 (H) 12/09/2018 1148   BUN 27 08/11/2017 0924   BUN 33 (H) 04/09/2017 0905   BUN 34.4 (H) 03/28/2016 0853   CREATININE 1.73 (H) 12/09/2018 1148   CREATININE 1.72 (H) 08/18/2017 1543   CREATININE 1.7 (H) 03/28/2016 0853    GLU 113 07/24/2016      Component Value Date/Time   CALCIUM 8.7 (L) 12/09/2018 1148   CALCIUM 9.2 04/09/2017 0905   CALCIUM 9.2 03/28/2016 0853   ALKPHOS 63 12/09/2018 1148   ALKPHOS 69 04/09/2017 0905   ALKPHOS 85 03/28/2016 0853   AST 25 12/09/2018 1148   AST 23 03/28/2016 0853   ALT 32 12/09/2018 1148   ALT 27 04/09/2017 0905   ALT 37 03/28/2016 0853   BILITOT 0.5 12/09/2018 1148   BILITOT 0.79 03/28/2016 0853      Impression and Plan: [name] is a very pleasant 85 yo caucasian gentleman with both erythropoietin deficiency and iron deficiency anemia.   He does not need  Aranesp today.  We will see what his iron studies look like.  I will plan to get him back in 6 weeks.  If he has any problems between now and then, hopefully, he will see me in church if we can finally go to church despite the coronavirus pandemic.   [name], MD 8/13/202012:59 PM

## 2018-12-10 DIAGNOSIS — D485 Neoplasm of uncertain behavior of skin: Secondary | ICD-10-CM | POA: Diagnosis not present

## 2018-12-10 DIAGNOSIS — C44229 Squamous cell carcinoma of skin of left ear and external auricular canal: Secondary | ICD-10-CM | POA: Diagnosis not present

## 2018-12-10 DIAGNOSIS — L821 Other seborrheic keratosis: Secondary | ICD-10-CM | POA: Diagnosis not present

## 2018-12-10 DIAGNOSIS — L812 Freckles: Secondary | ICD-10-CM | POA: Diagnosis not present

## 2018-12-10 DIAGNOSIS — L570 Actinic keratosis: Secondary | ICD-10-CM | POA: Diagnosis not present

## 2018-12-10 DIAGNOSIS — D1801 Hemangioma of skin and subcutaneous tissue: Secondary | ICD-10-CM | POA: Diagnosis not present

## 2018-12-10 DIAGNOSIS — Z85828 Personal history of other malignant neoplasm of skin: Secondary | ICD-10-CM | POA: Diagnosis not present

## 2018-12-10 LAB — IRON AND TIBC
Iron: 85 ug/dL (ref 42–163)
Saturation Ratios: 33 % (ref 20–55)
TIBC: 253 ug/dL (ref 202–409)
UIBC: 168 ug/dL (ref 117–376)

## 2018-12-10 LAB — FERRITIN: Ferritin: 324 ng/mL (ref 24–336)

## 2018-12-17 DIAGNOSIS — N183 Chronic kidney disease, stage 3 (moderate): Secondary | ICD-10-CM | POA: Diagnosis not present

## 2018-12-17 DIAGNOSIS — I4891 Unspecified atrial fibrillation: Secondary | ICD-10-CM | POA: Diagnosis not present

## 2018-12-17 DIAGNOSIS — D472 Monoclonal gammopathy: Secondary | ICD-10-CM | POA: Diagnosis not present

## 2018-12-17 DIAGNOSIS — I129 Hypertensive chronic kidney disease with stage 1 through stage 4 chronic kidney disease, or unspecified chronic kidney disease: Secondary | ICD-10-CM | POA: Diagnosis not present

## 2018-12-22 ENCOUNTER — Ambulatory Visit: Payer: Medicare Other | Admitting: Gastroenterology

## 2019-01-10 DIAGNOSIS — Z23 Encounter for immunization: Secondary | ICD-10-CM | POA: Diagnosis not present

## 2019-01-20 ENCOUNTER — Inpatient Hospital Stay: Payer: Medicare Other | Attending: Hematology & Oncology

## 2019-01-20 ENCOUNTER — Other Ambulatory Visit: Payer: Self-pay

## 2019-01-20 ENCOUNTER — Encounter: Payer: Self-pay | Admitting: Hematology & Oncology

## 2019-01-20 ENCOUNTER — Inpatient Hospital Stay: Payer: Medicare Other

## 2019-01-20 ENCOUNTER — Inpatient Hospital Stay (HOSPITAL_BASED_OUTPATIENT_CLINIC_OR_DEPARTMENT_OTHER): Payer: Medicare Other | Admitting: Hematology & Oncology

## 2019-01-20 VITALS — BP 157/57 | HR 69 | Temp 97.5°F | Resp 18 | Wt 152.0 lb

## 2019-01-20 DIAGNOSIS — D500 Iron deficiency anemia secondary to blood loss (chronic): Secondary | ICD-10-CM

## 2019-01-20 DIAGNOSIS — I25810 Atherosclerosis of coronary artery bypass graft(s) without angina pectoris: Secondary | ICD-10-CM

## 2019-01-20 DIAGNOSIS — D509 Iron deficiency anemia, unspecified: Secondary | ICD-10-CM | POA: Diagnosis not present

## 2019-01-20 DIAGNOSIS — N189 Chronic kidney disease, unspecified: Secondary | ICD-10-CM | POA: Insufficient documentation

## 2019-01-20 DIAGNOSIS — D631 Anemia in chronic kidney disease: Secondary | ICD-10-CM | POA: Diagnosis not present

## 2019-01-20 LAB — CBC WITH DIFFERENTIAL (CANCER CENTER ONLY)
Abs Immature Granulocytes: 0.06 10*3/uL (ref 0.00–0.07)
Basophils Absolute: 0 10*3/uL (ref 0.0–0.1)
Basophils Relative: 0 %
Eosinophils Absolute: 0.1 10*3/uL (ref 0.0–0.5)
Eosinophils Relative: 2 %
HCT: 34.3 % — ABNORMAL LOW (ref 39.0–52.0)
Hemoglobin: 11.3 g/dL — ABNORMAL LOW (ref 13.0–17.0)
Immature Granulocytes: 1 %
Lymphocytes Relative: 26 %
Lymphs Abs: 1.8 10*3/uL (ref 0.7–4.0)
MCH: 30.3 pg (ref 26.0–34.0)
MCHC: 32.9 g/dL (ref 30.0–36.0)
MCV: 92 fL (ref 80.0–100.0)
Monocytes Absolute: 0.6 10*3/uL (ref 0.1–1.0)
Monocytes Relative: 9 %
Neutro Abs: 4.2 10*3/uL (ref 1.7–7.7)
Neutrophils Relative %: 62 %
Platelet Count: 150 10*3/uL (ref 150–400)
RBC: 3.73 MIL/uL — ABNORMAL LOW (ref 4.22–5.81)
RDW: 15.5 % (ref 11.5–15.5)
WBC Count: 6.8 10*3/uL (ref 4.0–10.5)
nRBC: 0 % (ref 0.0–0.2)

## 2019-01-20 LAB — CMP (CANCER CENTER ONLY)
ALT: 63 U/L — ABNORMAL HIGH (ref 0–44)
AST: 41 U/L (ref 15–41)
Albumin: 4.1 g/dL (ref 3.5–5.0)
Alkaline Phosphatase: 61 U/L (ref 38–126)
Anion gap: 5 (ref 5–15)
BUN: 32 mg/dL — ABNORMAL HIGH (ref 8–23)
CO2: 26 mmol/L (ref 22–32)
Calcium: 9.3 mg/dL (ref 8.9–10.3)
Chloride: 109 mmol/L (ref 98–111)
Creatinine: 1.67 mg/dL — ABNORMAL HIGH (ref 0.61–1.24)
GFR, Est AFR Am: 42 mL/min — ABNORMAL LOW (ref >60)
GFR, Estimated: 36 mL/min — ABNORMAL LOW (ref >60)
Glucose, Bld: 88 mg/dL (ref 70–99)
Potassium: 5.3 mmol/L — ABNORMAL HIGH (ref 3.5–5.1)
Sodium: 140 mmol/L (ref 135–145)
Total Bilirubin: 0.6 mg/dL (ref 0.3–1.2)
Total Protein: 7 g/dL (ref 6.5–8.1)

## 2019-01-20 LAB — RETICULOCYTES
Immature Retic Fract: 12.6 % (ref 2.3–15.9)
RBC.: 3.71 MIL/uL — ABNORMAL LOW (ref 4.22–5.81)
Retic Count, Absolute: 55.7 10*3/uL (ref 19.0–186.0)
Retic Ct Pct: 1.5 % (ref 0.4–3.1)

## 2019-01-20 NOTE — Progress Notes (Signed)
Hematology and Oncology Follow Up Visit  [name] [id number] 1928/04/29 85 y.o. 01/20/2019   Principle Diagnosis:  Erythropoietin deficiency anemia Iron deficiency anemia  Current Therapy:   IV iron as indicated Aranesp 300 g subcutaneous as needed for hemoglobin less than 11 - last dose given on  10/07/2018   Interim History:  [name] is here today for follow-up.  He is doing quite well.  He feels well.  I see him at church on Sunday.  He and his wife are in our church choir.  He is feeling well.  He really has had no specific complaints.  There is been no problems with bleeding.  He has had no change in bowel or bladder habits.  He has had no leg swelling.  It looks like his kidney function seems to be doing a little bit better.  His iron studies have been doing pretty well.  I would not think that his iron should be on the low side.    Overall, his performance status is ECOG 2.  Medications:  Allergies as of 01/20/2019      Reactions   Penicillins Hives   Has patient had a PCN reaction causing immediate rash, facial/tongue/throat swelling, SOB or lightheadedness with hypotension: No Has patient had a PCN reaction causing severe rash involving mucus membranes or skin necrosis: Yes Has patient had a PCN reaction that required hospitalization: No Has patient had a PCN reaction occurring within the last 10 years: No If all of the above answers are "NO", then may proceed with Cephalosporin use.   Vancomycin Other (See Comments)   Edema and myalgia      Medication List       Accurate as of January 20, 2019 11:29 AM. If you have any questions, ask your nurse or doctor.        Accu-Chek FastClix Lancets Misc Check fasting blood sugar once daily   allopurinol 300 MG tablet Commonly known as: ZYLOPRIM Take 0.5 tablets (150 mg total) by mouth daily.   amLODipine 5 MG tablet Commonly known as: NORVASC Take 1 tablet (5 mg total) by mouth daily.   apixaban  2.5 MG Tabs tablet Commonly known as: Eliquis Take 1 tablet (2.5 mg total) by mouth 2 (two) times daily.   atorvastatin 20 MG tablet Commonly known as: LIPITOR Take 1 tablet (20 mg total) by mouth at bedtime.   carvedilol 6.25 MG tablet Commonly known as: COREG Take 1 tablet (6.25 mg total) by mouth 2 (two) times daily with a meal.   cholecalciferol 1000 units tablet Commonly known as: VITAMIN D Take 1,000 Units by mouth daily.   CHROMIUM GTF PO Take 1 capsule by mouth every evening.   Cinnamon 500 MG Tabs Take 2 tablets by mouth at bedtime.   Co Q 10 100 MG Caps Take 100 mg by mouth daily.   dicyclomine 10 MG capsule Commonly known as: BENTYL Take 1 capsule (10 mg total) by mouth 4 (four) times daily -  before meals and at bedtime. What changed: when to take this   diphenoxylate-atropine 2.5-0.025 MG tablet Commonly known as: LOMOTIL 1 tablet. Pt takes PRN   finasteride 5 MG tablet Commonly known as: PROSCAR Take 5 mg by mouth daily.   fish oil-omega-3 fatty acids 1000 MG capsule Take 1 g by mouth daily.   furosemide 20 MG tablet Commonly known as: LASIX Take 1 tablet (20 mg total) by mouth as directed.   glucose blood test strip Commonly known  as: Accu-Chek Guide Check fasting blood sugar once daily   latanoprost 0.005 % ophthalmic solution Commonly known as: XALATAN Place 1 drop into both eyes at bedtime.   potassium chloride 10 MEQ tablet Commonly known as: K-DUR Take 10 mEq by mouth.   PRESERVISION AREDS PO Take 1 tablet by mouth 2 (two) times daily.   PROBIOTIC PO Take 1 tablet by mouth daily.   tamsulosin 0.4 MG Caps capsule Commonly known as: FLOMAX TAKE 1 CAPSULE BY MOUTH ONCE DAILY AFTER SUPPER What changed: See the new instructions.   timolol 0.5 % ophthalmic solution Commonly known as: TIMOPTIC   VANADYL SULFATE PO Take 1 capsule by mouth every evening.   zinc gluconate 50 MG tablet Take 50 mg by mouth daily.       Allergies:   Allergies  Allergen Reactions  . Penicillins Hives    Has patient had a PCN reaction causing immediate rash, facial/tongue/throat swelling, SOB or lightheadedness with hypotension: No Has patient had a PCN reaction causing severe rash involving mucus membranes or skin necrosis: Yes Has patient had a PCN reaction that required hospitalization: No Has patient had a PCN reaction occurring within the last 10 years: No If all of the above answers are "NO", then may proceed with Cephalosporin use.   . Vancomycin Other (See Comments)    Edema and myalgia    Past Medical History, Surgical history, Social history, and Family History were reviewed and updated.  Review of Systems: Review of Systems  Constitutional: Negative.   HENT: Negative.   Eyes: Negative.   Respiratory: Negative.   Cardiovascular: Positive for palpitations.  Gastrointestinal: Negative.   Genitourinary: Negative.   Musculoskeletal: Negative.   Skin: Negative.   Neurological: Negative.   Endo/Heme/Allergies: Negative.   Psychiatric/Behavioral: Negative.      Physical Exam:  weight is 152 lb (68.9 kg). His temporal temperature is 97.5 F (36.4 C) (abnormal). His blood pressure is 157/57 (abnormal) and his pulse is 69. His respiration is 18 and oxygen saturation is 100%.   Wt Readings from Last 3 Encounters:  01/20/19 152 lb (68.9 kg)  12/09/18 152 lb (68.9 kg)  11/11/18 154 lb 4 oz (70 kg)    Physical Exam Vitals signs reviewed.  HENT:     Head: Normocephalic and atraumatic.  Eyes:     Pupils: Pupils are equal, round, and reactive to light.  Neck:     Musculoskeletal: Normal range of motion.  Cardiovascular:     Rate and Rhythm: Normal rate and regular rhythm.     Heart sounds: Normal heart sounds.  Pulmonary:     Effort: Pulmonary effort is normal.     Breath sounds: Normal breath sounds.  Abdominal:     General: Bowel sounds are normal.     Palpations: Abdomen is soft.  Musculoskeletal: Normal  range of motion.        General: No tenderness or deformity.  Lymphadenopathy:     Cervical: No cervical adenopathy.  Skin:    General: Skin is warm and dry.     Findings: No erythema or rash.  Neurological:     Mental Status: He is alert and oriented to person, place, and time.  Psychiatric:        Behavior: Behavior normal.        Thought Content: Thought content normal.        Judgment: Judgment normal.      Lab Results  Component Value Date   WBC 6.8 01/20/2019  HGB 11.3 (L) 01/20/2019   HCT 34.3 (L) 01/20/2019   MCV 92.0 01/20/2019   PLT 150 01/20/2019   Lab Results  Component Value Date   FERRITIN 324 12/09/2018   IRON 85 12/09/2018   TIBC 253 12/09/2018   UIBC 168 12/09/2018   IRONPCTSAT 33 12/09/2018   Lab Results  Component Value Date   RETICCTPCT 1.5 01/20/2019   RBC 3.73 (L) 01/20/2019   No results found for: KPAFRELGTCHN, LAMBDASER, KAPLAMBRATIO No results found for: Kandis Cocking, IGMSERUM Lab Results  Component Value Date   TOTALPROTELP 6.0 (L) 12/26/2015   ALBUMINELP 3.4 (L) 12/26/2015   A1GS 0.4 (H) 12/26/2015   A2GS 0.8 12/26/2015   BETS 0.4 12/26/2015   BETA2SER 0.3 12/26/2015   GAMS 0.8 12/26/2015   MSPIKE Not Observed 01/29/2016   SPEI SEE NOTE 12/26/2015     Chemistry      Component Value Date/Time   NA 140 01/20/2019 1018   NA 141 08/11/2017 0924   NA 145 04/09/2017 0905   NA 140 03/28/2016 0853   K 5.3 (H) 01/20/2019 1018   K 4.5 04/09/2017 0905   K 4.4 03/28/2016 0853   CL 109 01/20/2019 1018   CL 105 04/09/2017 0905   CO2 26 01/20/2019 1018   CO2 27 04/09/2017 0905   CO2 19 (L) 03/28/2016 0853   BUN 32 (H) 01/20/2019 1018   BUN 27 08/11/2017 0924   BUN 33 (H) 04/09/2017 0905   BUN 34.4 (H) 03/28/2016 0853   CREATININE 1.67 (H) 01/20/2019 1018   CREATININE 1.72 (H) 08/18/2017 1543   CREATININE 1.7 (H) 03/28/2016 0853   GLU 113 07/24/2016      Component Value Date/Time   CALCIUM 9.3 01/20/2019 1018   CALCIUM 9.2  04/09/2017 0905   CALCIUM 9.2 03/28/2016 0853   ALKPHOS 61 01/20/2019 1018   ALKPHOS 69 04/09/2017 0905   ALKPHOS 85 03/28/2016 0853   AST 41 01/20/2019 1018   AST 23 03/28/2016 0853   ALT 63 (H) 01/20/2019 1018   ALT 27 04/09/2017 0905   ALT 37 03/28/2016 0853   BILITOT 0.6 01/20/2019 1018   BILITOT 0.79 03/28/2016 0853      Impression and Plan: Mr. Gruszka is a very pleasant 85 yo caucasian gentleman with both erythropoietin deficiency and iron deficiency anemia.   He does not need  Aranesp today.  We will see what his iron studies look like.  I will plan to get him back in 6 weeks.  I would like to see him back before the holiday season begins.  If he has any problems between now and then, hopefully, he will see me in church if we can finally go to church despite the coronavirus pandemic.   [name], MD 9/24/202011:29 AM

## 2019-01-21 LAB — FERRITIN: Ferritin: 352 ng/mL — ABNORMAL HIGH (ref 24–336)

## 2019-01-21 LAB — IRON AND TIBC
Iron: 78 ug/dL (ref 42–163)
Saturation Ratios: 30 % (ref 20–55)
TIBC: 256 ug/dL (ref 202–409)
UIBC: 178 ug/dL (ref 117–376)

## 2019-01-24 ENCOUNTER — Encounter: Payer: Self-pay | Admitting: Internal Medicine

## 2019-01-24 ENCOUNTER — Ambulatory Visit (INDEPENDENT_AMBULATORY_CARE_PROVIDER_SITE_OTHER): Payer: Medicare Other | Admitting: Internal Medicine

## 2019-01-24 VITALS — BP 136/54 | HR 74 | Ht 68.0 in | Wt 155.0 lb

## 2019-01-24 DIAGNOSIS — I442 Atrioventricular block, complete: Secondary | ICD-10-CM | POA: Diagnosis not present

## 2019-01-24 DIAGNOSIS — I25810 Atherosclerosis of coronary artery bypass graft(s) without angina pectoris: Secondary | ICD-10-CM

## 2019-01-24 DIAGNOSIS — I10 Essential (primary) hypertension: Secondary | ICD-10-CM

## 2019-01-24 DIAGNOSIS — I4819 Other persistent atrial fibrillation: Secondary | ICD-10-CM | POA: Diagnosis not present

## 2019-01-24 NOTE — Progress Notes (Signed)
Electrophysiology TeleHealth Note  Due to national recommendations of social distancing due to [place] 19, an audio telehealth visit is felt to be most appropriate for this patient at this time.  Verbal consent was obtained by me for the telehealth visit today.  The patient does not have capability for a virtual visit.  A phone visit is therefore required today.   Date:  01/24/2019   ID:  [name], DOB [birth year], MRN [id number]  Location: patient's home  Provider location:  [facility]  Evaluation Performed: Follow-up visit  PCP:  [name], DO   Electrophysiologist:  Dr Rayann Heman  Chief Complaint:  palpitations  History of Present Illness:    [name] is a 85 y.o. male who presents via telehealth conferencing today.  Since last being seen in our clinic, the patient reports doing very well.  He is active, without any symptoms of arrhythmia.  He is going to silver sneakers twice per week without difficulty.Today, he denies symptoms of palpitations, chest pain, shortness of breath,  lower extremity edema, dizziness, presyncope, or syncope.  The patient is otherwise without complaint today.  The patient denies symptoms of fevers, chills, cough, or new SOB worrisome for COVID 19.  Past Medical History:  Diagnosis Date   A-fib [facility])    Basal cell carcinoma    skin   Bigeminal rhythm    Bradycardia 06/21/2017   Cataract    Chronic renal insufficiency, stage 3 (moderate) ([place]) 2018   GFR 30s-40s   Coronary artery disease    post bypass   CVD (cardiovascular disease)    Diabetes mellitus without complication ([place])    Diverticular disease    Easy bruising    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   Gout    Hernia    Hyperlipidemia    Hypertension    IBS (irritable bowel syndrome)    Iron deficiency anemia 02/04/2016   Macular degeneration    Microscopic colitis    PVD (peripheral vascular disease) ([place])     Past Surgical History:   Procedure Laterality Date   CARDIAC CATHETERIZATION  2006   CARDIOVERSION N/A 02/22/2013   Procedure: CARDIOVERSION;  Surgeon: [name], MD;  Location: [place];  Service: Cardiovascular;  Laterality: N/A;   CARDIOVERSION N/A 11/15/2014   Procedure: CARDIOVERSION;  Surgeon: [name], MD;  Location: [place];  Service: Cardiovascular;  Laterality: N/A;   CARDIOVERSION N/A 04/01/2017   Procedure: CARDIOVERSION;  Surgeon: Larey Dresser, MD;  Location: [facility] ENDOSCOPY;  Service: Cardiovascular;  Laterality: N/A;   CAROTID ENDARTERECTOMY  2009/ 1993   left/ right   COLONOSCOPY  03/17/2005   The colon is normal.   CORONARY ARTERY BYPASS GRAFT  1999   ESOPHAGOGASTRODUODENOSCOPY  03/17/2005   Normal esophagus. Normal Stomanch. Normal duodenum.    EYE SURGERY     eyelid repair   INGUINAL HERNIA REPAIR  02/11/2012   Procedure: LAPAROSCOPIC BILATERAL INGUINAL HERNIA REPAIR;  Surgeon: Pedro Earls, MD;  Location: [facility];  Service: General;  Laterality: Bilateral;   PACEMAKER IMPLANT N/A 07/28/2017   [employer] Jude Medical Assurity MRI conditional  dual-chamber pacemaker for symptomatic second degree AV block by Dr Rayann Heman   SKIN CANCER EXCISION     right ear x 3    Current Outpatient Medications  Medication Sig Dispense Refill   ACCU-CHEK FASTCLIX LANCETS MISC Check fasting blood sugar once daily 100 each 3   allopurinol (ZYLOPRIM) 300 MG tablet Take 0.5 tablets (  150 mg total) by mouth daily. 45 tablet 3   amLODipine (NORVASC) 5 MG tablet Take 1 tablet (5 mg total) by mouth daily. 90 tablet 3   apixaban (ELIQUIS) 2.5 MG TABS tablet Take 1 tablet (2.5 mg total) by mouth 2 (two) times daily. 180 tablet 2   atorvastatin (LIPITOR) 20 MG tablet Take 1 tablet (20 mg total) by mouth at bedtime. 90 tablet 3   carvedilol (COREG) 6.25 MG tablet Take 1 tablet (6.25 mg total) by mouth 2 (two) times daily with a meal. 180 tablet 1   cholecalciferol (VITAMIN D) 1000 units  tablet Take 1,000 Units by mouth daily.      CHROMIUM GTF PO Take 1 capsule by mouth every evening.      Cinnamon 500 MG TABS Take 2 tablets by mouth at bedtime.      Coenzyme Q10 (CO Q 10) 100 MG CAPS Take 100 mg by mouth daily.      dicyclomine (BENTYL) 10 MG capsule Take 1 capsule (10 mg total) by mouth 4 (four) times daily -  before meals and at bedtime. (Patient taking differently: Take 10 mg by mouth daily. ) 360 capsule 3   diphenoxylate-atropine (LOMOTIL) 2.5-0.025 MG tablet 1 tablet. Pt takes PRN     finasteride (PROSCAR) 5 MG tablet Take 5 mg by mouth daily.  3   fish oil-omega-3 fatty acids 1000 MG capsule Take 1 g by mouth daily.      glucose blood (ACCU-CHEK GUIDE) test strip Check fasting blood sugar once daily 100 each 3   latanoprost (XALATAN) 0.005 % ophthalmic solution Place 1 drop into both eyes at bedtime.     Multiple Vitamins-Minerals (PRESERVISION AREDS PO) Take 1 tablet by mouth 2 (two) times daily.      potassium chloride (K-DUR) 10 MEQ tablet Take 10 mEq by mouth daily.      Probiotic Product (PROBIOTIC PO) Take 1 tablet by mouth daily.     tamsulosin (FLOMAX) 0.4 MG CAPS capsule TAKE 1 CAPSULE BY MOUTH ONCE DAILY AFTER SUPPER (Patient taking differently: TAKE 2 CAPSULES BY MOUTH ONCE DAILY AFTER SUPPER) 90 capsule 3   timolol (TIMOPTIC) 0.5 % ophthalmic solution Place 1 drop into both eyes 2 (two) times daily.      VANADYL SULFATE PO Take 1 capsule by mouth every evening.      zinc gluconate 50 MG tablet Take 50 mg by mouth daily.      furosemide (LASIX) 20 MG tablet Take 1 tablet (20 mg total) by mouth as directed. 90 tablet 3   No current facility-administered medications for this visit.     Allergies:   Penicillins and Vancomycin   Social History:  The patient  reports that he quit smoking about 57 years ago. He quit smokeless tobacco use about 50 years ago.  His smokeless tobacco use included chew. He reports that he does not drink alcohol or  use drugs.   Family History:  The patient's family history includes Coronary artery disease in his father; Heart disease in his father; Melanoma in his sister; Stroke in his mother.   ROS:  Please see the history of present illness.   All other systems are personally reviewed and negative.    Exam:    Vital Signs:  BP (!) 136/54    Pulse 74    Ht 5\' 8"  (1.727 m)    Wt 155 lb (70.3 kg)    BMI 23.57 kg/m   Well sounding, alert and conversant  Labs/Other Tests and Data Reviewed:    Recent Labs: 05/04/2018: TSH 2.68 01/20/2019: ALT 63; BUN 32; Creatinine 1.67; Hemoglobin 11.3; Platelet Count 150; Potassium 5.3; Sodium 140   Wt Readings from Last 3 Encounters:  01/24/19 155 lb (70.3 kg)  01/20/19 152 lb (68.9 kg)  12/09/18 152 lb (68.9 kg)     Last device remote is reviewed from [place] PDF which reveals normal device function, afib burden is 84% by remotes   ASSESSMENT & PLAN:    1.  Complete heart block Normal pacemaker function Remotes are uptodate  2. Persistent atrial fibrillation/ atypical atrial flutter Doing well, without any symptoms chads2vasc score is 5.  Continue eliquis afib burden is 84% by remotes Continue rate control long term  3. HTN Stable No change required today  4. CAD No ischemic symptoms No changes   Follow-up:  Remotes Return to see EP NP in 6 months   Patient Risk:  after full review of this patients clinical status, I feel that they are at moderate risk at this time.  Today, I have spent 15 minutes with the patient with telehealth technology discussing arrhythmia management .    Army Fossa, MD  01/24/2019 2:39 PM     [address] [place] [address] [phone] (office) [phone] (fax)

## 2019-01-26 DIAGNOSIS — S63641A Sprain of metacarpophalangeal joint of right thumb, initial encounter: Secondary | ICD-10-CM | POA: Diagnosis not present

## 2019-01-31 ENCOUNTER — Encounter: Payer: Medicare Other | Admitting: Internal Medicine

## 2019-01-31 ENCOUNTER — Ambulatory Visit (INDEPENDENT_AMBULATORY_CARE_PROVIDER_SITE_OTHER): Payer: Medicare Other | Admitting: *Deleted

## 2019-01-31 DIAGNOSIS — I441 Atrioventricular block, second degree: Secondary | ICD-10-CM

## 2019-01-31 DIAGNOSIS — I5032 Chronic diastolic (congestive) heart failure: Secondary | ICD-10-CM

## 2019-02-01 LAB — CUP PACEART REMOTE DEVICE CHECK
Battery Remaining Longevity: 105 mo
Battery Remaining Percentage: 95.5 %
Battery Voltage: 2.99 V
Brady Statistic AP VP Percent: 35 %
Brady Statistic AP VS Percent: 1 %
Brady Statistic AS VP Percent: 62 %
Brady Statistic AS VS Percent: 1.9 %
Brady Statistic RA Percent Paced: 3.2 %
Brady Statistic RV Percent Paced: 96 %
Date Time Interrogation Session: 20201005074534
Implantable Lead Implant Date: 20190402
Implantable Lead Implant Date: 20190402
Implantable Lead Location: 753859
Implantable Lead Location: 753860
Implantable Pulse Generator Implant Date: 20190402
Lead Channel Impedance Value: 360 Ohm
Lead Channel Impedance Value: 450 Ohm
Lead Channel Pacing Threshold Amplitude: 0.5 V
Lead Channel Pacing Threshold Amplitude: 0.75 V
Lead Channel Pacing Threshold Pulse Width: 0.5 ms
Lead Channel Pacing Threshold Pulse Width: 0.5 ms
Lead Channel Sensing Intrinsic Amplitude: 12 mV
Lead Channel Sensing Intrinsic Amplitude: 2 mV
Lead Channel Setting Pacing Amplitude: 2 V
Lead Channel Setting Pacing Amplitude: 2.5 V
Lead Channel Setting Pacing Pulse Width: 0.5 ms
Lead Channel Setting Sensing Sensitivity: 2 mV
Pulse Gen Model: 2272
Pulse Gen Serial Number: 9003454

## 2019-02-07 NOTE — Progress Notes (Signed)
Remote pacemaker transmission.   

## 2019-02-14 DIAGNOSIS — E119 Type 2 diabetes mellitus without complications: Secondary | ICD-10-CM | POA: Diagnosis not present

## 2019-02-16 DIAGNOSIS — L84 Corns and callosities: Secondary | ICD-10-CM | POA: Diagnosis not present

## 2019-02-16 DIAGNOSIS — L603 Nail dystrophy: Secondary | ICD-10-CM | POA: Diagnosis not present

## 2019-02-16 DIAGNOSIS — E1151 Type 2 diabetes mellitus with diabetic peripheral angiopathy without gangrene: Secondary | ICD-10-CM | POA: Diagnosis not present

## 2019-02-16 DIAGNOSIS — I739 Peripheral vascular disease, unspecified: Secondary | ICD-10-CM | POA: Diagnosis not present

## 2019-03-04 ENCOUNTER — Other Ambulatory Visit: Payer: Self-pay | Admitting: Internal Medicine

## 2019-03-04 NOTE — Telephone Encounter (Signed)
Pt last saw Dr Rayann Heman 01/24/19, last labs 01/20/19 Creat 1.67, age 85, weight 70.3kg, based on specified criteria pt is on appropriate dosage of Eliquis 2.5mg  BID.  Will refill rx.

## 2019-03-17 ENCOUNTER — Inpatient Hospital Stay: Payer: Medicare Other

## 2019-03-17 ENCOUNTER — Encounter: Payer: Self-pay | Admitting: Hematology & Oncology

## 2019-03-17 ENCOUNTER — Other Ambulatory Visit: Payer: Self-pay

## 2019-03-17 ENCOUNTER — Inpatient Hospital Stay: Payer: Medicare Other | Attending: Hematology & Oncology | Admitting: Hematology & Oncology

## 2019-03-17 ENCOUNTER — Telehealth: Payer: Self-pay | Admitting: Hematology & Oncology

## 2019-03-17 VITALS — BP 151/58 | HR 70 | Temp 97.6°F | Resp 18 | Wt 156.5 lb

## 2019-03-17 DIAGNOSIS — D500 Iron deficiency anemia secondary to blood loss (chronic): Secondary | ICD-10-CM | POA: Diagnosis not present

## 2019-03-17 DIAGNOSIS — N189 Chronic kidney disease, unspecified: Secondary | ICD-10-CM | POA: Diagnosis not present

## 2019-03-17 DIAGNOSIS — D631 Anemia in chronic kidney disease: Secondary | ICD-10-CM

## 2019-03-17 DIAGNOSIS — I25810 Atherosclerosis of coronary artery bypass graft(s) without angina pectoris: Secondary | ICD-10-CM | POA: Diagnosis not present

## 2019-03-17 DIAGNOSIS — D508 Other iron deficiency anemias: Secondary | ICD-10-CM

## 2019-03-17 DIAGNOSIS — D509 Iron deficiency anemia, unspecified: Secondary | ICD-10-CM

## 2019-03-17 LAB — RETICULOCYTES
Immature Retic Fract: 13.4 % (ref 2.3–15.9)
RBC.: 3.29 MIL/uL — ABNORMAL LOW (ref 4.22–5.81)
Retic Count, Absolute: 63.2 10*3/uL (ref 19.0–186.0)
Retic Ct Pct: 1.9 % (ref 0.4–3.1)

## 2019-03-17 LAB — CMP (CANCER CENTER ONLY)
ALT: 43 U/L (ref 0–44)
AST: 31 U/L (ref 15–41)
Albumin: 4.2 g/dL (ref 3.5–5.0)
Alkaline Phosphatase: 55 U/L (ref 38–126)
Anion gap: 7 (ref 5–15)
BUN: 29 mg/dL — ABNORMAL HIGH (ref 8–23)
CO2: 24 mmol/L (ref 22–32)
Calcium: 8.8 mg/dL — ABNORMAL LOW (ref 8.9–10.3)
Chloride: 111 mmol/L (ref 98–111)
Creatinine: 1.74 mg/dL — ABNORMAL HIGH (ref 0.61–1.24)
GFR, Est AFR Am: 40 mL/min — ABNORMAL LOW (ref >60)
GFR, Estimated: 34 mL/min — ABNORMAL LOW (ref >60)
Glucose, Bld: 93 mg/dL (ref 70–99)
Potassium: 4.9 mmol/L (ref 3.5–5.1)
Sodium: 142 mmol/L (ref 135–145)
Total Bilirubin: 0.6 mg/dL (ref 0.3–1.2)
Total Protein: 6.2 g/dL — ABNORMAL LOW (ref 6.5–8.1)

## 2019-03-17 LAB — CBC WITH DIFFERENTIAL (CANCER CENTER ONLY)
Abs Immature Granulocytes: 0.07 10*3/uL (ref 0.00–0.07)
Basophils Absolute: 0 10*3/uL (ref 0.0–0.1)
Basophils Relative: 0 %
Eosinophils Absolute: 0.1 10*3/uL (ref 0.0–0.5)
Eosinophils Relative: 2 %
HCT: 31.5 % — ABNORMAL LOW (ref 39.0–52.0)
Hemoglobin: 10.2 g/dL — ABNORMAL LOW (ref 13.0–17.0)
Immature Granulocytes: 1 %
Lymphocytes Relative: 24 %
Lymphs Abs: 1.4 10*3/uL (ref 0.7–4.0)
MCH: 30.8 pg (ref 26.0–34.0)
MCHC: 32.4 g/dL (ref 30.0–36.0)
MCV: 95.2 fL (ref 80.0–100.0)
Monocytes Absolute: 0.6 10*3/uL (ref 0.1–1.0)
Monocytes Relative: 10 %
Neutro Abs: 3.8 10*3/uL (ref 1.7–7.7)
Neutrophils Relative %: 63 %
Platelet Count: 151 10*3/uL (ref 150–400)
RBC: 3.31 MIL/uL — ABNORMAL LOW (ref 4.22–5.81)
RDW: 14.6 % (ref 11.5–15.5)
WBC Count: 6 10*3/uL (ref 4.0–10.5)
nRBC: 0 % (ref 0.0–0.2)

## 2019-03-17 LAB — IRON AND TIBC
Iron: 79 ug/dL (ref 42–163)
Saturation Ratios: 31 % (ref 20–55)
TIBC: 254 ug/dL (ref 202–409)
UIBC: 175 ug/dL (ref 117–376)

## 2019-03-17 LAB — FERRITIN: Ferritin: 376 ng/mL — ABNORMAL HIGH (ref 24–336)

## 2019-03-17 MED ORDER — DARBEPOETIN ALFA 300 MCG/0.6ML IJ SOSY
PREFILLED_SYRINGE | INTRAMUSCULAR | Status: AC
  Filled 2019-03-17: qty 0.6

## 2019-03-17 MED ORDER — DARBEPOETIN ALFA 300 MCG/0.6ML IJ SOSY
300.0000 ug | PREFILLED_SYRINGE | Freq: Once | INTRAMUSCULAR | Status: AC
  Administered 2019-03-17: 300 ug via SUBCUTANEOUS

## 2019-03-17 NOTE — Progress Notes (Signed)
Hematology and Oncology Follow Up Visit  DODGER SINNING [id number] [birth year] 85 y.o. 03/17/2019   Principle Diagnosis:  Erythropoietin deficiency anemia Iron deficiency anemia  Current Therapy:   IV iron as indicated Aranesp 300 g subcutaneous as needed for hemoglobin less than 11 - last dose given on  10/07/2018   Interim History:  Mr. Degroff is here today for follow-up.  He is doing quite well.  He feels well.  I see him at church on Sunday.  He and his wife are in our church choir.  He is feeling well.  He really has had no specific complaints.  There is been no problems with bleeding.  He has had no change in bowel or bladder habits.  He has had no leg swelling.  His iron studies when we last saw him showed a ferritin of 352 with an iron saturation of 30%.  Overall, his performance status is ECOG 2.  Medications:  Allergies as of 03/17/2019      Reactions   Penicillins Hives   Has patient had a PCN reaction causing immediate rash, facial/tongue/throat swelling, SOB or lightheadedness with hypotension: No Has patient had a PCN reaction causing severe rash involving mucus membranes or skin necrosis: Yes Has patient had a PCN reaction that required hospitalization: No Has patient had a PCN reaction occurring within the last 10 years: No If all of the above answers are "NO", then may proceed with Cephalosporin use.   Vancomycin Other (See Comments)   Edema and myalgia      Medication List       Accurate as of March 17, 2019 10:39 AM. If you have any questions, ask your nurse or doctor.        Accu-Chek FastClix Lancets Misc Check fasting blood sugar once daily   allopurinol 300 MG tablet Commonly known as: ZYLOPRIM Take 0.5 tablets (150 mg total) by mouth daily.   amLODipine 5 MG tablet Commonly known as: NORVASC Take 1 tablet (5 mg total) by mouth daily.   atorvastatin 20 MG tablet Commonly known as: LIPITOR Take 1 tablet (20 mg total) by mouth at  bedtime.   carvedilol 6.25 MG tablet Commonly known as: COREG Take 1 tablet (6.25 mg total) by mouth 2 (two) times daily with a meal.   cholecalciferol 1000 units tablet Commonly known as: VITAMIN D Take 1,000 Units by mouth daily.   CHROMIUM GTF PO Take 1 capsule by mouth every evening.   Cinnamon 500 MG Tabs Take 2 tablets by mouth at bedtime.   Co Q 10 100 MG Caps Take 100 mg by mouth daily.   dicyclomine 10 MG capsule Commonly known as: BENTYL Take 1 capsule (10 mg total) by mouth 4 (four) times daily -  before meals and at bedtime. What changed: when to take this   diphenoxylate-atropine 2.5-0.025 MG tablet Commonly known as: LOMOTIL 1 tablet. Pt takes PRN   Eliquis 2.5 MG Tabs tablet Generic drug: apixaban TAKE 1 TABLET TWICE A DAY   finasteride 5 MG tablet Commonly known as: PROSCAR Take 5 mg by mouth daily.   fish oil-omega-3 fatty acids 1000 MG capsule Take 1 g by mouth daily.   furosemide 20 MG tablet Commonly known as: LASIX Take 1 tablet (20 mg total) by mouth as directed.   glucose blood test strip Commonly known as: Accu-Chek Guide Check fasting blood sugar once daily   latanoprost 0.005 % ophthalmic solution Commonly known as: XALATAN Place 1 drop into both eyes at  bedtime.   potassium chloride 10 MEQ tablet Commonly known as: KLOR-CON Take 10 mEq by mouth daily.   PRESERVISION AREDS PO Take 1 tablet by mouth 2 (two) times daily.   PROBIOTIC PO Take 1 tablet by mouth daily.   tamsulosin 0.4 MG Caps capsule Commonly known as: FLOMAX TAKE 1 CAPSULE BY MOUTH ONCE DAILY AFTER SUPPER What changed: See the new instructions.   timolol 0.5 % ophthalmic solution Commonly known as: TIMOPTIC Place 1 drop into both eyes 2 (two) times daily.   VANADYL SULFATE PO Take 1 capsule by mouth every evening.   zinc gluconate 50 MG tablet Take 50 mg by mouth daily.       Allergies:  Allergies  Allergen Reactions  . Penicillins Hives    Has  patient had a PCN reaction causing immediate rash, facial/tongue/throat swelling, SOB or lightheadedness with hypotension: No Has patient had a PCN reaction causing severe rash involving mucus membranes or skin necrosis: Yes Has patient had a PCN reaction that required hospitalization: No Has patient had a PCN reaction occurring within the last 10 years: No If all of the above answers are "NO", then may proceed with Cephalosporin use.   . Vancomycin Other (See Comments)    Edema and myalgia    Past Medical History, Surgical history, Social history, and Family History were reviewed and updated.  Review of Systems: Review of Systems  Constitutional: Negative.   HENT: Negative.   Eyes: Negative.   Respiratory: Negative.   Cardiovascular: Positive for palpitations.  Gastrointestinal: Negative.   Genitourinary: Negative.   Musculoskeletal: Negative.   Skin: Negative.   Neurological: Negative.   Endo/Heme/Allergies: Negative.   Psychiatric/Behavioral: Negative.      Physical Exam:  weight is 156 lb 8 oz (71 kg). His temporal temperature is 97.6 F (36.4 C). His blood pressure is 151/58 (abnormal) and his pulse is 70. His respiration is 18 and oxygen saturation is 100%.   Wt Readings from Last 3 Encounters:  03/17/19 156 lb 8 oz (71 kg)  01/24/19 155 lb (70.3 kg)  01/20/19 152 lb (68.9 kg)    Physical Exam Vitals signs reviewed.  HENT:     Head: Normocephalic and atraumatic.  Eyes:     Pupils: Pupils are equal, round, and reactive to light.  Neck:     Musculoskeletal: Normal range of motion.  Cardiovascular:     Rate and Rhythm: Normal rate and regular rhythm.     Heart sounds: Normal heart sounds.  Pulmonary:     Effort: Pulmonary effort is normal.     Breath sounds: Normal breath sounds.  Abdominal:     General: Bowel sounds are normal.     Palpations: Abdomen is soft.  Musculoskeletal: Normal range of motion.        General: No tenderness or deformity.   Lymphadenopathy:     Cervical: No cervical adenopathy.  Skin:    General: Skin is warm and dry.     Findings: No erythema or rash.  Neurological:     Mental Status: He is alert and oriented to person, place, and time.  Psychiatric:        Behavior: Behavior normal.        Thought Content: Thought content normal.        Judgment: Judgment normal.      Lab Results  Component Value Date   WBC 6.0 03/17/2019   HGB 10.2 (L) 03/17/2019   HCT 31.5 (L) 03/17/2019   MCV 95.2  03/17/2019   PLT 151 03/17/2019   Lab Results  Component Value Date   FERRITIN 352 (H) 01/20/2019   IRON 78 01/20/2019   TIBC 256 01/20/2019   UIBC 178 01/20/2019   IRONPCTSAT 30 01/20/2019   Lab Results  Component Value Date   RETICCTPCT 1.9 03/17/2019   RBC 3.31 (L) 03/17/2019   RBC 3.29 (L) 03/17/2019   No results found for: KPAFRELGTCHN, LAMBDASER, KAPLAMBRATIO No results found for: Kandis Cocking, IGMSERUM Lab Results  Component Value Date   TOTALPROTELP 6.0 (L) 12/26/2015   ALBUMINELP 3.4 (L) 12/26/2015   A1GS 0.4 (H) 12/26/2015   A2GS 0.8 12/26/2015   BETS 0.4 12/26/2015   BETA2SER 0.3 12/26/2015   GAMS 0.8 12/26/2015   MSPIKE Not Observed 01/29/2016   SPEI SEE NOTE 12/26/2015     Chemistry      Component Value Date/Time   NA 142 03/17/2019 0934   NA 141 08/11/2017 0924   NA 145 04/09/2017 0905   NA 140 03/28/2016 0853   K 4.9 03/17/2019 0934   K 4.5 04/09/2017 0905   K 4.4 03/28/2016 0853   CL 111 03/17/2019 0934   CL 105 04/09/2017 0905   CO2 24 03/17/2019 0934   CO2 27 04/09/2017 0905   CO2 19 (L) 03/28/2016 0853   BUN 29 (H) 03/17/2019 0934   BUN 27 08/11/2017 0924   BUN 33 (H) 04/09/2017 0905   BUN 34.4 (H) 03/28/2016 0853   CREATININE 1.74 (H) 03/17/2019 0934   CREATININE 1.72 (H) 08/18/2017 1543   CREATININE 1.7 (H) 03/28/2016 0853   GLU 113 07/24/2016      Component Value Date/Time   CALCIUM 8.8 (L) 03/17/2019 0934   CALCIUM 9.2 04/09/2017 0905   CALCIUM 9.2  03/28/2016 0853   ALKPHOS 55 03/17/2019 0934   ALKPHOS 69 04/09/2017 0905   ALKPHOS 85 03/28/2016 0853   AST 31 03/17/2019 0934   AST 23 03/28/2016 0853   ALT 43 03/17/2019 0934   ALT 27 04/09/2017 0905   ALT 37 03/28/2016 0853   BILITOT 0.6 03/17/2019 0934   BILITOT 0.79 03/28/2016 0853      Impression and Plan: Mr. Hurlbut is a very pleasant 85 yo caucasian gentleman with both erythropoietin deficiency and iron deficiency anemia.   He does need  Aranesp today.  We will see what his iron studies look like.  I think that we can get him through the Thanksgiving and Christmas holidays now.  He was responds well to the Aranesp.  His last Aranesp injection was back in June..  If he has any problems between now and then, hopefully, he will see me in church if we can finally go to church despite the coronavirus pandemic.   [name], MD 11/19/202010:39 AM

## 2019-03-17 NOTE — Telephone Encounter (Signed)
Called and LMVM regarding appointments added per 11/19 los

## 2019-03-17 NOTE — Patient Instructions (Signed)
Darbepoetin Alfa injection (Aranesp) O que  este medicamento? A ALFADARBEPOETINA ajuda o seu organismo a produzir mais glbulos vermelhos do sangue.  usada para tratar a anemia provocada por doena renal crnica ou quimioterapia. Este medicamento pode ser usado para outros propsitos; em caso de dvidas, pergunte ao seu profissional de sade ou farmacutico. NOMES DE MARCAS COMUNS: Aranesp O que devo dizer a meu profissional de sade antes de tomar este medicamento? Precisam saber se voc tem algum dos seguintes problemas ou estados de sade:  distrbios da coagulao sangunea ou histria pregressa de cogulos (trombose)  tem cncer e no est fazendo quimioterapia  fibrose cstica  doenas cardacas, inclusive dor no peito (angina), insuficincia cardaca ou ataque do corao prvio  hemoglobina de 12 g/dL ou acima  presso alta  carncia de cido flico, ferro ou vitamina B12  convulses  reao estranha ou alergia  alfadarbepoetina,  eritropoetina,  albumina, s protenas de hamster ou ao ltex  reao estranha ou alergia a outros medicamentos, alimentos, corantes ou conservantes  est grvida ou tentando engravidar  est amamentando Como devo usar este medicamento? Este medicamento deve ser injetado por via subcutnea ou intravenosa. Este medicamento costuma ser administrado por um profissional da sade no hospital ou em consultrio. Se este medicamento for administrado em casa, voc ser ensinado a preparar e aplicar o medicamento. Use exatamente como indicado. Tome este medicamento em intervalos regulares. No tome este medicamento com frequncia maior do que a indicada.  muito importante que as agulhas e seringas usadas sejam descartadas em um coletor especial para materiais perfurocortantes. No as coloque na lata de lixo. Se no tiver um coletor para materiais perfurocortantes, pea um a seu farmacutico ou profissional de sade. O farmacutico lhe dar um folheto  informativo especial a cada compra do medicamento. No se esquea de ler atentamente essas informaes todas as vezes. Fale com seu pediatra a respeito do uso deste medicamento em crianas. Embora este medicamento possa ser administrado em crianas a partir de 1 ms de idade para certos quadros clnicos, algumas precaues so necessrias. Superdosagem: Se achar que tomou uma superdosagem deste medicamento, entre em contato imediatamente com o [facility] ou v a um pronto-socorro. OBSERVAO: Este medicamento  s para voc. No compartilhe este medicamento com outras pessoas. E se eu deixar de tomar uma dose? Se perder uma dose, tome-a assim que possvel. Se j estiver quase na hora da sua prxima dose, tome somente essa dose. No tome o remdio em dobro, nem tome uma dose adicional. O que pode interagir com este medicamento? No tome este medicamento com nenhum dos seguintes:  alfaepoetina Esta lista pode no descrever todas as interaes possveis. D ao seu profissional de sade uma lista de todos os medicamentos, ervas medicinais, remdios de venda livre, ou suplementos alimentares que voc usa. Diga tambm se voc fuma, bebe, ou usa drogas ilcitas. Alguns destes podem interagir com o seu medicamento. Ao que devo ficar atento quando estiver usando este medicamento? Voc ser monitorado(a) atentamente enquanto estiver tomando este medicamento. Voc precisar fazer exames de sangue peridicos enquanto estiver tomando este medicamento. Este medicamento pode causar uma diminuio nos nveis de vitamina B6. Voc deve consumir vitamina B6 em quantidade suficiente enquanto estiver tomando este medicamento. Converse com seu mdico ou profissional de sade a respeito dos alimentos que consome e das vitaminas que toma. Que efeitos colaterais posso sentir aps usar este medicamento? Efeitos colaterais que devem ser informados ao seu mdico ou profissional de sade o mais rpido    possvel:  reaes alrgicas, como erupo na pele, coceira, urticria, ou inchao do rosto, dos lbios ou da lngua  dificuldade para respirar  alteraes na viso  dor no peito  confuso, dificuldade para falar ou entender os outros  sensao de tontura, desmaio, quedas  presso alta  dores musculares  dor, inchao, calor na perna  ganho rpido de peso  dor de cabea forte  dormncia ou fraqueza sbita do rosto, brao ou perna  dificuldade para andar, tontura, perda de equilbrio ou coordenao  convulses (crises convulsivas)  inchao dos tornozelos, ps ou mos  fraqueza ou cansao fora do comum Efeitos colaterais que normalmente no precisam de cuidados mdicos (avise ao seu mdico ou profissional de sade se persistirem ou forem incmodos):  diarreia  febre, calafrios (sintomas gripais)  dor de cabea  enjoo ou vmitos  vermelhido, ardncia ou inchao no local da injeo Esta lista pode no descrever todos os efeitos colaterais possveis. Para mais orientaes sobre efeitos colaterais, consulte o seu mdico. Voc pode relatar a ocorrncia de efeitos colaterais  FDA pelo telefone [phone]. Onde devo guardar meu medicamento? Manter fora do alcance das crianas. Conservar sob refrigerao, entre 2 e 8 degreesC (36 e 46 degreesF). No congelar. No agite. Descartar qualquer poro no utilizada se estiver usando um frasco-ampola de dose nica. Descartar qualquer medicamento no utilizado aps a data de validade impressa no rtulo ou embalagem. OBSERVAO: Este folheto  um resumo. Pode no cobrir todas as informaes possveis. Se tiver dvidas a respeito deste medicamento, fale com seu mdico, farmacutico ou profissional de sade.  2020 Elsevier/Gold Standard (2017-07-07 00:00:00)  

## 2019-03-17 NOTE — Telephone Encounter (Signed)
Appointments scheduled letter/calendar mailed per 11/19 los

## 2019-03-30 DIAGNOSIS — H401134 Primary open-angle glaucoma, bilateral, indeterminate stage: Secondary | ICD-10-CM | POA: Diagnosis not present

## 2019-03-30 DIAGNOSIS — H02105 Unspecified ectropion of left lower eyelid: Secondary | ICD-10-CM | POA: Diagnosis not present

## 2019-03-30 DIAGNOSIS — H353134 Nonexudative age-related macular degeneration, bilateral, advanced atrophic with subfoveal involvement: Secondary | ICD-10-CM | POA: Diagnosis not present

## 2019-03-30 DIAGNOSIS — H524 Presbyopia: Secondary | ICD-10-CM | POA: Diagnosis not present

## 2019-04-14 ENCOUNTER — Other Ambulatory Visit: Payer: Self-pay | Admitting: Family Medicine

## 2019-04-25 ENCOUNTER — Telehealth: Payer: Self-pay | Admitting: Family Medicine

## 2019-04-25 NOTE — Telephone Encounter (Signed)
FYI - No call back is needed.   Patient was called about COVID screening for in office appt / I explained to him of the risk he had coming to an in office appt.  He decided to hold off on making an in office appt (which he prefers to come in to see Dr. Raoul Pitch)  Until end of Feb or early March.  He said he was doing well, all his meds were up to date, no refills needed at this time. He will call if needed before then.   I have placed him on my call list for Dr. Raoul Pitch to call in February to schedule follow up appt.  Thank you

## 2019-04-26 ENCOUNTER — Ambulatory Visit: Payer: Medicare Other | Admitting: Family Medicine

## 2019-05-02 ENCOUNTER — Ambulatory Visit (INDEPENDENT_AMBULATORY_CARE_PROVIDER_SITE_OTHER): Payer: Medicare Other | Admitting: *Deleted

## 2019-05-02 DIAGNOSIS — I441 Atrioventricular block, second degree: Secondary | ICD-10-CM | POA: Diagnosis not present

## 2019-05-02 LAB — CUP PACEART REMOTE DEVICE CHECK
Date Time Interrogation Session: 20210104063244
Implantable Lead Implant Date: 20190402
Implantable Lead Implant Date: 20190402
Implantable Lead Location: 753859
Implantable Lead Location: 753860
Implantable Pulse Generator Implant Date: 20190402
Pulse Gen Model: 2272
Pulse Gen Serial Number: 9003454

## 2019-05-05 ENCOUNTER — Encounter: Payer: Self-pay | Admitting: Hematology & Oncology

## 2019-05-05 ENCOUNTER — Telehealth: Payer: Self-pay | Admitting: Hematology & Oncology

## 2019-05-05 ENCOUNTER — Inpatient Hospital Stay: Payer: Medicare Other

## 2019-05-05 ENCOUNTER — Other Ambulatory Visit: Payer: Self-pay

## 2019-05-05 ENCOUNTER — Inpatient Hospital Stay: Payer: Medicare Other | Attending: Hematology & Oncology

## 2019-05-05 ENCOUNTER — Inpatient Hospital Stay (HOSPITAL_BASED_OUTPATIENT_CLINIC_OR_DEPARTMENT_OTHER): Payer: Medicare Other | Admitting: Hematology & Oncology

## 2019-05-05 DIAGNOSIS — D500 Iron deficiency anemia secondary to blood loss (chronic): Secondary | ICD-10-CM

## 2019-05-05 DIAGNOSIS — D631 Anemia in chronic kidney disease: Secondary | ICD-10-CM

## 2019-05-05 DIAGNOSIS — D509 Iron deficiency anemia, unspecified: Secondary | ICD-10-CM | POA: Insufficient documentation

## 2019-05-05 DIAGNOSIS — Z79899 Other long term (current) drug therapy: Secondary | ICD-10-CM | POA: Diagnosis not present

## 2019-05-05 DIAGNOSIS — N189 Chronic kidney disease, unspecified: Secondary | ICD-10-CM | POA: Diagnosis not present

## 2019-05-05 LAB — CBC WITH DIFFERENTIAL (CANCER CENTER ONLY)
Abs Immature Granulocytes: 0.02 10*3/uL (ref 0.00–0.07)
Basophils Absolute: 0 10*3/uL (ref 0.0–0.1)
Basophils Relative: 0 %
Eosinophils Absolute: 0.2 10*3/uL (ref 0.0–0.5)
Eosinophils Relative: 2 %
HCT: 36.9 % — ABNORMAL LOW (ref 39.0–52.0)
Hemoglobin: 12.3 g/dL — ABNORMAL LOW (ref 13.0–17.0)
Immature Granulocytes: 0 %
Lymphocytes Relative: 26 %
Lymphs Abs: 1.9 10*3/uL (ref 0.7–4.0)
MCH: 30.5 pg (ref 26.0–34.0)
MCHC: 33.3 g/dL (ref 30.0–36.0)
MCV: 91.6 fL (ref 80.0–100.0)
Monocytes Absolute: 0.6 10*3/uL (ref 0.1–1.0)
Monocytes Relative: 8 %
Neutro Abs: 4.4 10*3/uL (ref 1.7–7.7)
Neutrophils Relative %: 64 %
Platelet Count: 147 10*3/uL — ABNORMAL LOW (ref 150–400)
RBC: 4.03 MIL/uL — ABNORMAL LOW (ref 4.22–5.81)
RDW: 13.9 % (ref 11.5–15.5)
WBC Count: 7.1 10*3/uL (ref 4.0–10.5)
nRBC: 0 % (ref 0.0–0.2)

## 2019-05-05 LAB — CMP (CANCER CENTER ONLY)
ALT: 38 U/L (ref 0–44)
AST: 32 U/L (ref 15–41)
Albumin: 4.1 g/dL (ref 3.5–5.0)
Alkaline Phosphatase: 54 U/L (ref 38–126)
Anion gap: 6 (ref 5–15)
BUN: 35 mg/dL — ABNORMAL HIGH (ref 8–23)
CO2: 24 mmol/L (ref 22–32)
Calcium: 9.2 mg/dL (ref 8.9–10.3)
Chloride: 111 mmol/L (ref 98–111)
Creatinine: 1.67 mg/dL — ABNORMAL HIGH (ref 0.61–1.24)
GFR, Est AFR Am: 42 mL/min — ABNORMAL LOW (ref >60)
GFR, Estimated: 36 mL/min — ABNORMAL LOW (ref >60)
Glucose, Bld: 130 mg/dL — ABNORMAL HIGH (ref 70–99)
Potassium: 4.4 mmol/L (ref 3.5–5.1)
Sodium: 141 mmol/L (ref 135–145)
Total Bilirubin: 0.6 mg/dL (ref 0.3–1.2)
Total Protein: 6.6 g/dL (ref 6.5–8.1)

## 2019-05-05 LAB — IRON AND TIBC
Iron: 92 ug/dL (ref 42–163)
Saturation Ratios: 35 % (ref 20–55)
TIBC: 261 ug/dL (ref 202–409)
UIBC: 170 ug/dL (ref 117–376)

## 2019-05-05 LAB — FERRITIN: Ferritin: 259 ng/mL (ref 24–336)

## 2019-05-05 NOTE — Progress Notes (Signed)
Hematology and Oncology Follow Up Visit  [name] [id number] 27-May-1928 86 y.o. 05/05/2019   Principle Diagnosis:  Erythropoietin deficiency anemia Iron deficiency anemia  Current Therapy:   IV iron as indicated Aranesp 300 g subcutaneous as needed for hemoglobin less than 11 - last dose given on  10/07/2018   Interim History:  [name] is here today for follow-up.  He is doing quite well.  He had no problems over the Christmas holiday.  He has had no issues with fatigue or weakness.  He has had no bleeding.  He is eating well.  He has had no problems with fever.  He and his wife are being very cautious with the coronavirus.  His wife, unfortunate, needs to have a surgery for her back next week.  It sounds like a TENS UNIT will be placed.  Thankfully, his iron studies have always been fine.  Back in November, his ferritin was 376 with an iron saturation of 31%.   Overall, his performance status is ECOG 1.  Medications:  Allergies as of 05/05/2019      Reactions   Penicillins Hives   Has patient had a PCN reaction causing immediate rash, facial/tongue/throat swelling, SOB or lightheadedness with hypotension: No Has patient had a PCN reaction causing severe rash involving mucus membranes or skin necrosis: Yes Has patient had a PCN reaction that required hospitalization: No Has patient had a PCN reaction occurring within the last 10 years: No If all of the above answers are "NO", then may proceed with Cephalosporin use.   Vancomycin Other (See Comments)   Edema and myalgia      Medication List       Accurate as of May 05, 2019 10:28 AM. If you have any questions, ask your nurse or doctor.        Accu-Chek FastClix Lancets Misc Check fasting blood sugar once daily   allopurinol 300 MG tablet Commonly known as: ZYLOPRIM Take 0.5 tablets (150 mg total) by mouth daily.   amLODipine 5 MG tablet Commonly known as: NORVASC Take 1 tablet (5 mg total) by mouth  daily.   atorvastatin 20 MG tablet Commonly known as: LIPITOR Take 1 tablet (20 mg total) by mouth at bedtime.   carvedilol 6.25 MG tablet Commonly known as: COREG Take 1 tablet (6.25 mg total) by mouth 2 (two) times daily with a meal.   cholecalciferol 1000 units tablet Commonly known as: VITAMIN D Take 1,000 Units by mouth daily.   CHROMIUM GTF PO Take 1 capsule by mouth every evening.   Cinnamon 500 MG Tabs Take 2 tablets by mouth at bedtime.   Co Q 10 100 MG Caps Take 100 mg by mouth daily.   dicyclomine 10 MG capsule Commonly known as: BENTYL Take 1 capsule (10 mg total) by mouth 4 (four) times daily -  before meals and at bedtime. What changed: when to take this   diphenoxylate-atropine 2.5-0.025 MG tablet Commonly known as: LOMOTIL 1 tablet. Pt takes PRN   Eliquis 2.5 MG Tabs tablet Generic drug: apixaban TAKE 1 TABLET TWICE A DAY   finasteride 5 MG tablet Commonly known as: PROSCAR Take 5 mg by mouth daily.   fish oil-omega-3 fatty acids 1000 MG capsule Take 1 g by mouth daily.   furosemide 20 MG tablet Commonly known as: LASIX Take 1 tablet (20 mg total) by mouth as directed.   glucose blood test strip Commonly known as: Accu-Chek Guide Check fasting blood sugar once daily  latanoprost 0.005 % ophthalmic solution Commonly known as: XALATAN Place 1 drop into both eyes at bedtime.   potassium chloride 10 MEQ tablet Commonly known as: KLOR-CON Take 10 mEq by mouth daily.   potassium chloride 10 MEQ tablet Commonly known as: KLOR-CON TAKE 1 TABLET BY MOUTH EVERY DAY   PRESERVISION AREDS PO Take 1 tablet by mouth 2 (two) times daily.   PROBIOTIC PO Take 1 tablet by mouth daily.   tamsulosin 0.4 MG Caps capsule Commonly known as: FLOMAX TAKE 1 CAPSULE BY MOUTH ONCE DAILY AFTER SUPPER What changed: See the new instructions.   timolol 0.5 % ophthalmic solution Commonly known as: TIMOPTIC Place 1 drop into both eyes 2 (two) times daily.     VANADYL SULFATE PO Take 1 capsule by mouth every evening.   zinc gluconate 50 MG tablet Take 50 mg by mouth daily.       Allergies:  Allergies  Allergen Reactions  . Penicillins Hives    Has patient had a PCN reaction causing immediate rash, facial/tongue/throat swelling, SOB or lightheadedness with hypotension: No Has patient had a PCN reaction causing severe rash involving mucus membranes or skin necrosis: Yes Has patient had a PCN reaction that required hospitalization: No Has patient had a PCN reaction occurring within the last 10 years: No If all of the above answers are "NO", then may proceed with Cephalosporin use.   . Vancomycin Other (See Comments)    Edema and myalgia    Past Medical History, Surgical history, Social history, and Family History were reviewed and updated.  Review of Systems: Review of Systems  Constitutional: Negative.   HENT: Negative.   Eyes: Negative.   Respiratory: Negative.   Cardiovascular: Positive for palpitations.  Gastrointestinal: Negative.   Genitourinary: Negative.   Musculoskeletal: Negative.   Skin: Negative.   Neurological: Negative.   Endo/Heme/Allergies: Negative.   Psychiatric/Behavioral: Negative.      Physical Exam:  vitals were not taken for this visit.   Wt Readings from Last 3 Encounters:  03/17/19 156 lb 8 oz (71 kg)  01/24/19 155 lb (70.3 kg)  01/20/19 152 lb (68.9 kg)    Physical Exam Vitals reviewed.  HENT:     Head: Normocephalic and atraumatic.  Eyes:     Pupils: Pupils are equal, round, and reactive to light.  Cardiovascular:     Rate and Rhythm: Normal rate and regular rhythm.     Heart sounds: Normal heart sounds.  Pulmonary:     Effort: Pulmonary effort is normal.     Breath sounds: Normal breath sounds.  Abdominal:     General: Bowel sounds are normal.     Palpations: Abdomen is soft.  Musculoskeletal:        General: No tenderness or deformity. Normal range of motion.     Cervical back:  Normal range of motion.  Lymphadenopathy:     Cervical: No cervical adenopathy.  Skin:    General: Skin is warm and dry.     Findings: No erythema or rash.  Neurological:     Mental Status: He is alert and oriented to person, place, and time.  Psychiatric:        Behavior: Behavior normal.        Thought Content: Thought content normal.        Judgment: Judgment normal.      Lab Results  Component Value Date   WBC 7.1 05/05/2019   HGB 12.3 (L) 05/05/2019   HCT 36.9 (L) 05/05/2019  MCV 91.6 05/05/2019   PLT 147 (L) 05/05/2019   Lab Results  Component Value Date   FERRITIN 376 (H) 03/17/2019   IRON 79 03/17/2019   TIBC 254 03/17/2019   UIBC 175 03/17/2019   IRONPCTSAT 31 03/17/2019   Lab Results  Component Value Date   RETICCTPCT 1.9 03/17/2019   RBC 4.03 (L) 05/05/2019   No results found for: KPAFRELGTCHN, LAMBDASER, KAPLAMBRATIO No results found for: Kandis Cocking, IGMSERUM Lab Results  Component Value Date   TOTALPROTELP 6.0 (L) 12/26/2015   ALBUMINELP 3.4 (L) 12/26/2015   A1GS 0.4 (H) 12/26/2015   A2GS 0.8 12/26/2015   BETS 0.4 12/26/2015   BETA2SER 0.3 12/26/2015   GAMS 0.8 12/26/2015   MSPIKE Not Observed 01/29/2016   SPEI SEE NOTE 12/26/2015     Chemistry      Component Value Date/Time   NA 141 05/05/2019 0917   NA 141 08/11/2017 0924   NA 145 04/09/2017 0905   NA 140 03/28/2016 0853   K 4.4 05/05/2019 0917   K 4.5 04/09/2017 0905   K 4.4 03/28/2016 0853   CL 111 05/05/2019 0917   CL 105 04/09/2017 0905   CO2 24 05/05/2019 0917   CO2 27 04/09/2017 0905   CO2 19 (L) 03/28/2016 0853   BUN 35 (H) 05/05/2019 0917   BUN 27 08/11/2017 0924   BUN 33 (H) 04/09/2017 0905   BUN 34.4 (H) 03/28/2016 0853   CREATININE 1.67 (H) 05/05/2019 0917   CREATININE 1.72 (H) 08/18/2017 1543   CREATININE 1.7 (H) 03/28/2016 0853   GLU 113 07/24/2016 0000      Component Value Date/Time   CALCIUM 9.2 05/05/2019 0917   CALCIUM 9.2 04/09/2017 0905   CALCIUM 9.2  03/28/2016 0853   ALKPHOS 54 05/05/2019 0917   ALKPHOS 69 04/09/2017 0905   ALKPHOS 85 03/28/2016 0853   AST 32 05/05/2019 0917   AST 23 03/28/2016 0853   ALT 38 05/05/2019 0917   ALT 27 04/09/2017 0905   ALT 37 03/28/2016 0853   BILITOT 0.6 05/05/2019 0917   BILITOT 0.79 03/28/2016 0853      Impression and Plan: [name] is a very pleasant 86 yo caucasian gentleman with both erythropoietin deficiency and iron deficiency anemia.   He was response to Aranesp.  He does not need any Aranesp today.  We will plan to get him back in 2-3 months.  I think this would be a reasonable for him.  I am sure he will need Aranesp when we see him back.  [name], MD 1/7/202110:28 AM

## 2019-05-05 NOTE — Telephone Encounter (Signed)
Called and spoke with with patient regarding appointments added per 1/7 los

## 2019-05-08 ENCOUNTER — Other Ambulatory Visit: Payer: Self-pay | Admitting: Family Medicine

## 2019-05-12 ENCOUNTER — Other Ambulatory Visit: Payer: Self-pay

## 2019-05-12 MED ORDER — ACCU-CHEK GUIDE VI STRP: ORAL_STRIP | 0 refills | Status: DC

## 2019-05-12 NOTE — Progress Notes (Signed)
Faxed refill request from [facility] for patients test strips. Pt wanted to wait- per note to come in Feb due to COVID-19 and would rather see in person. He is on the call list to schedule appt in Feb. Sent short supply to pharmacy to last patient until next appt. Sent patient my chart message also asking him to schedule.

## 2019-05-15 ENCOUNTER — Encounter: Payer: Self-pay | Admitting: Family Medicine

## 2019-05-16 DIAGNOSIS — N401 Enlarged prostate with lower urinary tract symptoms: Secondary | ICD-10-CM | POA: Diagnosis not present

## 2019-05-16 DIAGNOSIS — R351 Nocturia: Secondary | ICD-10-CM | POA: Diagnosis not present

## 2019-05-16 DIAGNOSIS — R310 Gross hematuria: Secondary | ICD-10-CM | POA: Diagnosis not present

## 2019-05-18 DIAGNOSIS — I739 Peripheral vascular disease, unspecified: Secondary | ICD-10-CM | POA: Diagnosis not present

## 2019-05-18 DIAGNOSIS — L603 Nail dystrophy: Secondary | ICD-10-CM | POA: Diagnosis not present

## 2019-05-18 DIAGNOSIS — L84 Corns and callosities: Secondary | ICD-10-CM | POA: Diagnosis not present

## 2019-05-18 DIAGNOSIS — E1151 Type 2 diabetes mellitus with diabetic peripheral angiopathy without gangrene: Secondary | ICD-10-CM | POA: Diagnosis not present

## 2019-05-24 ENCOUNTER — Telehealth: Payer: Self-pay | Admitting: Family Medicine

## 2019-05-24 DIAGNOSIS — H9190 Unspecified hearing loss, unspecified ear: Secondary | ICD-10-CM

## 2019-05-24 NOTE — Telephone Encounter (Signed)
Wife called requesting referral for upcoming appt at [facility]  [phone] [address] [place], [place]  Patient has an appt on 06/14/19.  Patient was told he needed referral from his PCP.

## 2019-05-24 NOTE — Telephone Encounter (Signed)
I have placed the referral to audiology-at the location they requested

## 2019-05-24 NOTE — Addendum Note (Signed)
Addended by: Howard Pouch A on: 05/24/2019 04:26 PM   Modules accepted: Orders

## 2019-05-24 NOTE — Telephone Encounter (Signed)
FYI for pts upcoming appt

## 2019-05-25 NOTE — Telephone Encounter (Signed)
Pts wife was called and given information, she verbalized understanding  

## 2019-05-26 ENCOUNTER — Other Ambulatory Visit: Payer: Self-pay | Admitting: Family Medicine

## 2019-05-26 DIAGNOSIS — E7800 Pure hypercholesterolemia, unspecified: Secondary | ICD-10-CM

## 2019-06-08 DIAGNOSIS — Z570 Occupational exposure to noise: Secondary | ICD-10-CM | POA: Diagnosis not present

## 2019-06-08 DIAGNOSIS — D0421 Carcinoma in situ of skin of right ear and external auricular canal: Secondary | ICD-10-CM | POA: Diagnosis not present

## 2019-06-08 DIAGNOSIS — H906 Mixed conductive and sensorineural hearing loss, bilateral: Secondary | ICD-10-CM | POA: Diagnosis not present

## 2019-06-14 DIAGNOSIS — L821 Other seborrheic keratosis: Secondary | ICD-10-CM | POA: Diagnosis not present

## 2019-06-14 DIAGNOSIS — L570 Actinic keratosis: Secondary | ICD-10-CM | POA: Diagnosis not present

## 2019-06-14 DIAGNOSIS — D1801 Hemangioma of skin and subcutaneous tissue: Secondary | ICD-10-CM | POA: Diagnosis not present

## 2019-06-14 DIAGNOSIS — Z85828 Personal history of other malignant neoplasm of skin: Secondary | ICD-10-CM | POA: Diagnosis not present

## 2019-06-14 DIAGNOSIS — L812 Freckles: Secondary | ICD-10-CM | POA: Diagnosis not present

## 2019-06-15 DIAGNOSIS — R310 Gross hematuria: Secondary | ICD-10-CM | POA: Diagnosis not present

## 2019-06-15 DIAGNOSIS — N400 Enlarged prostate without lower urinary tract symptoms: Secondary | ICD-10-CM | POA: Diagnosis not present

## 2019-06-15 DIAGNOSIS — C678 Malignant neoplasm of overlapping sites of bladder: Secondary | ICD-10-CM | POA: Diagnosis not present

## 2019-06-20 ENCOUNTER — Telehealth: Payer: Self-pay | Admitting: Cardiovascular Disease

## 2019-06-20 NOTE — Telephone Encounter (Signed)
   Primary Cardiologist:Peter Johnsie Cancel, MD  Chart reviewed as part of pre-operative protocol coverage. Because of [name] Slee's past medical history and time since last visit, he/she will require a follow-up visit in order to better assess preoperative cardiovascular risk.  Needs EP or Nishan/App for surgical clearance.  Please arrange.    Pre-op covering staff: - Please schedule appointment and call patient to inform them. - Please contact requesting surgeon's office via preferred method (i.e, phone, fax) to inform them of need for appointment prior to surgery.  If applicable, this message will also be routed to pharmacy pool and/or primary cardiologist for input on holding anticoagulant/antiplatelet agent as requested below so that this information is available at time of patient's appointment.   Cecilie Kicks, NP  06/20/2019, 1:57 PM

## 2019-06-20 NOTE — Telephone Encounter (Signed)
Patient with diagnosis of afib on Eliquis for anticoagulation.    Procedure:  Removal of bladder tumor  Date of procedure: TBD  CHADS2-VASc score of  6 (CHF, HTN, AGE, DM2, CAD, AGE)  CrCl 30.7 ml/min  Per office protocol, patient can hold Eliquis for 2 days prior to procedure.

## 2019-06-20 NOTE — Telephone Encounter (Signed)
Pharm, please address his eliquis, for bladder surgery.  Thanks.

## 2019-06-20 NOTE — Telephone Encounter (Signed)
New Message      [facility] Pre-operative Risk Assessment    Request for surgical clearance:  1. What type of surgery is being performed? Removal of bladder tumor   2. When is this surgery scheduled? TBD   3. What type of clearance is required (medical clearance vs. Pharmacy clearance to hold med vs. Both)? Both  4. Are there any medications that need to be held prior to surgery and how long? Eliquis 48 hours prior procedure   5. Practice name and name of physician performing surgery? Dr. Franchot Gallo  6. What is your office phone number [phone]   7.   What is your office fax number [phone]  8.   Anesthesia type (None, local, MAC, general) ? General   [name] 06/20/2019, 11:58 AM  _________________________________________________________________   (provider comments below)

## 2019-06-21 ENCOUNTER — Other Ambulatory Visit: Payer: Self-pay

## 2019-06-21 NOTE — Telephone Encounter (Signed)
Pt has been scheduled to see Kathyrn Drown, NP 07/06/19 @ 11:45 and to arrive by 11:30 for registration and will need to wear his mask for the entire appt. Pt is agreeable to plan of care and thanked me for the call. I will send notes to Np for upcoming appt as well as FYI to the surgeon pt has upcoming appt. I will remove from the pre op call back pool.

## 2019-06-28 ENCOUNTER — Other Ambulatory Visit: Payer: Self-pay

## 2019-06-28 ENCOUNTER — Encounter: Payer: Self-pay | Admitting: Family Medicine

## 2019-06-28 ENCOUNTER — Ambulatory Visit (INDEPENDENT_AMBULATORY_CARE_PROVIDER_SITE_OTHER): Payer: Medicare Other | Admitting: Family Medicine

## 2019-06-28 VITALS — BP 144/72 | HR 69 | Temp 97.6°F | Resp 17 | Ht 68.0 in | Wt 157.0 lb

## 2019-06-28 DIAGNOSIS — D500 Iron deficiency anemia secondary to blood loss (chronic): Secondary | ICD-10-CM

## 2019-06-28 DIAGNOSIS — I480 Paroxysmal atrial fibrillation: Secondary | ICD-10-CM | POA: Diagnosis not present

## 2019-06-28 DIAGNOSIS — M109 Gout, unspecified: Secondary | ICD-10-CM

## 2019-06-28 DIAGNOSIS — I5032 Chronic diastolic (congestive) heart failure: Secondary | ICD-10-CM

## 2019-06-28 DIAGNOSIS — I441 Atrioventricular block, second degree: Secondary | ICD-10-CM | POA: Diagnosis not present

## 2019-06-28 DIAGNOSIS — N183 Chronic kidney disease, stage 3 unspecified: Secondary | ICD-10-CM | POA: Diagnosis not present

## 2019-06-28 DIAGNOSIS — I10 Essential (primary) hypertension: Secondary | ICD-10-CM | POA: Diagnosis not present

## 2019-06-28 DIAGNOSIS — E7849 Other hyperlipidemia: Secondary | ICD-10-CM | POA: Diagnosis not present

## 2019-06-28 DIAGNOSIS — K580 Irritable bowel syndrome with diarrhea: Secondary | ICD-10-CM

## 2019-06-28 DIAGNOSIS — R7303 Prediabetes: Secondary | ICD-10-CM

## 2019-06-28 DIAGNOSIS — R319 Hematuria, unspecified: Secondary | ICD-10-CM

## 2019-06-28 DIAGNOSIS — I25708 Atherosclerosis of coronary artery bypass graft(s), unspecified, with other forms of angina pectoris: Secondary | ICD-10-CM

## 2019-06-28 DIAGNOSIS — D631 Anemia in chronic kidney disease: Secondary | ICD-10-CM | POA: Diagnosis not present

## 2019-06-28 LAB — POCT GLYCOSYLATED HEMOGLOBIN (HGB A1C)
HbA1c POC (<> result, manual entry): 6.5 % (ref 4.0–5.6)
HbA1c, POC (controlled diabetic range): 6.5 % (ref 0.0–7.0)
HbA1c, POC (prediabetic range): 6.5 % — AB (ref 5.7–6.4)
Hemoglobin A1C: 6.5 % — AB (ref 4.0–5.6)

## 2019-06-28 MED ORDER — ALLOPURINOL 300 MG PO TABS: 150.0000 mg | ORAL_TABLET | Freq: Every day | ORAL | 3 refills | Status: DC

## 2019-06-28 MED ORDER — BLOOD GLUCOSE METER KIT: PACK | 0 refills | Status: DC

## 2019-06-28 MED ORDER — CARVEDILOL 6.25 MG PO TABS: 6.2500 mg | ORAL_TABLET | Freq: Two times a day (BID) | ORAL | 3 refills | Status: DC

## 2019-06-28 NOTE — Progress Notes (Signed)
[name] , [birth year], 86 y.o., male MRN: [id number] Patient Care Team    Relationship Specialty Notifications Start End  [name], DO PCP - General Family Medicine  12/18/15   [name], MD PCP - Cardiology Cardiology  03/17/17   [name], MD Consulting Physician Ophthalmology  12/18/15   [name], MD Consulting Physician Dermatology  12/18/15   [name], MD Consulting Physician Cardiology  12/18/15   [name], MD Consulting Physician Ophthalmology  12/19/15   [name], MD Consulting Physician Oncology  09/30/16   [name], MD Attending Physician Nephrology  09/30/16   [name], MD Consulting Physician Dermatology  09/30/16   Franchot Gallo, MD Consulting Physician Urology  06/30/19   Inocencio Homes, DPM Consulting Physician Podiatry  06/30/19     Chief Complaint  Patient presents with  . Hypertension    Pt is doing well with no complaints. Pt needs new script for DM supplies due to insurance changing. Pt will be having polyps removed from bladder within the next couple of weeks.     Subjective: [name] is a 86 y.o. male present for [facility] Hypertension/CKD3/A.Fib/IDA Patient reports compliance with  Coreg 6.25  twice a day and amlodipine 5 mg QD, lasix about weekly use needed.  Patient reports compliance with Eliquis 2.5 mg twice a day. He takes fish oil supplementation of 1000 mg daily. Lipitor 20 mg daily.  Patient denies chest pain, shortness of breath, dizziness or lower extremity edema. He reports he is feeling pretty good.  Reviewed labs collected by his hematologist 05/05/2019 Lipids 06/21/2017 within normal limits> collected next visit PTH/calcium/vitamin D: 01/15/2017 within normal limits--recheck next visit Diet: Low sodium diet followed Exercise: very active RF: CKD3, HLD, CAD w/ CABG (1999), cardiac cath 2011 2/2 CP (all grafts normal), PVD, Afib Significant cardiac history of CAD with prior CABG in 1999 -LIMA  to the LAD, SVG to IM, OM1, OM 2, SVG to D1, SVG to PDA/PLA, EF 45-50% with mild diffuse hypokinesis. Grafts patent by cath 2011 Nuclear stress test 08/2015 scar, no ischemia LVEF 70% also has atrial fibrillation/flutter in 2014 on Eliquis status post [facility] 10/2014, PVD, claudication.  Cardioversion 04/01/2017.  Pacemaker implant 07/28/2017.  Vascular carotid duplex 10/02/2017: "Mild carotid plaque bilaterally continue with prevention efforts "  Echo 04/03/17  EF 81-27%  Grade 2 diastolic  Mild to moderate MR Moderate LAE mild RAE   Allergies  Allergen Reactions  . Penicillins Hives    Has patient had a PCN reaction causing immediate rash, facial/tongue/throat swelling, SOB or lightheadedness with hypotension: No Has patient had a PCN reaction causing severe rash involving mucus membranes or skin necrosis: Yes Has patient had a PCN reaction that required hospitalization: No Has patient had a PCN reaction occurring within the last 10 years: No If all of the above answers are "NO", then may proceed with Cephalosporin use.   . Vancomycin Other (See Comments)    Edema and myalgia   Social History   Tobacco Use  . Smoking status: Former Smoker    Quit date: 12/25/1961    Years since quitting: 57.5  . Smokeless tobacco: Former Systems developer    Types: Chew    Quit date: 02/03/1969  Substance Use Topics  . Alcohol use: No    Alcohol/week: 0.0 standard drinks   Past Medical History:  Diagnosis Date  . A-fib ([place])   . Basal cell carcinoma    skin  .  Bigeminal rhythm   . Bradycardia 06/21/2017  . Cataract   . Chronic renal insufficiency, stage 3 (moderate) 2018   GFR 30s-40s  . Coronary artery disease    post bypass  . CVD (cardiovascular disease)   . Diabetes mellitus without complication ([place])   . Diverticular disease   . Easy bruising   . 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 Gout   . Hernia   . Hyperlipidemia   . Hypertension   . IBS (irritable bowel syndrome)   . Iron  deficiency anemia 02/04/2016  . Macular degeneration   . Microscopic colitis   . PVD (peripheral vascular disease) ([place])    Past Surgical History:  Procedure Laterality Date  . CARDIAC CATHETERIZATION  2006  . CARDIOVERSION N/A 02/22/2013   Procedure: CARDIOVERSION;  Surgeon: [name], MD;  Location: [facility] ENDOSCOPY;  Service: Cardiovascular;  Laterality: N/A;  . CARDIOVERSION N/A 11/15/2014   Procedure: CARDIOVERSION;  Surgeon: [name], MD;  Location: [facility] ENDOSCOPY;  Service: Cardiovascular;  Laterality: N/A;  . CARDIOVERSION N/A 04/01/2017   Procedure: CARDIOVERSION;  Surgeon: Larey Dresser, MD;  Location: [place];  Service: Cardiovascular;  Laterality: N/A;  . CAROTID ENDARTERECTOMY  2009/ 1993   left/ right  . COLONOSCOPY  03/17/2005   The colon is normal.  . CORONARY ARTERY BYPASS GRAFT  1999  . ESOPHAGOGASTRODUODENOSCOPY  03/17/2005   Normal esophagus. Normal Stomanch. Normal duodenum.   Marland Kitchen EYE SURGERY     eyelid repair  . INGUINAL HERNIA REPAIR  02/11/2012   Procedure: LAPAROSCOPIC BILATERAL INGUINAL HERNIA REPAIR;  Surgeon: Pedro Earls, MD;  Location: [facility] ORS;  Service: General;  Laterality: Bilateral;  . PACEMAKER IMPLANT N/A 07/28/2017   [employer] Jude Medical Assurity MRI conditional  dual-chamber pacemaker for symptomatic second degree AV block by Dr Rayann Heman  . SKIN CANCER EXCISION     right ear x 3   Family History  Problem Relation Age of Onset  . Stroke Mother   . Coronary artery disease Father   . Heart disease Father   . Melanoma Sister   . Colon cancer Neg Hx    Allergies as of 06/28/2019      Reactions   Penicillins Hives   Has patient had a PCN reaction causing immediate rash, facial/tongue/throat swelling, SOB or lightheadedness with hypotension: No Has patient had a PCN reaction causing severe rash involving mucus membranes or skin necrosis: Yes Has patient had a PCN reaction that required hospitalization: No Has patient had a PCN reaction  occurring within the last 10 years: No If all of the above answers are "NO", then may proceed with Cephalosporin use.   Vancomycin Other (See Comments)   Edema and myalgia      Medication List       Accurate as of June 28, 2019 11:59 PM. If you have any questions, ask your nurse or doctor.        Accu-Chek FastClix Lancets Misc Check fasting blood sugar once daily   Accu-Chek Guide test strip Generic drug: glucose blood Check fasting blood sugar once daily   allopurinol 300 MG tablet Commonly known as: ZYLOPRIM Take 0.5 tablets (150 mg total) by mouth daily.   amLODipine 5 MG tablet Commonly known as: NORVASC Take 1 tablet (5 mg total) by mouth daily.   atorvastatin 20 MG tablet Commonly known as: LIPITOR Take 1 tablet (20 mg total) by mouth at bedtime.   blood glucose meter kit and supplies Dispense based on patient  and insurance preference. Use up to four times daily as directed. R73.03 Started by: Howard Pouch, DO   carvedilol 6.25 MG tablet Commonly known as: COREG Take 1 tablet (6.25 mg total) by mouth 2 (two) times daily with a meal.   cholecalciferol 1000 units tablet Commonly known as: VITAMIN D Take 1,000 Units by mouth daily.   CHROMIUM GTF PO Take 1 capsule by mouth every evening.   Cinnamon 500 MG Tabs Take 2 tablets by mouth at bedtime.   Co Q 10 100 MG Caps Take 100 mg by mouth daily.   dicyclomine 10 MG capsule Commonly known as: BENTYL Take 1 capsule (10 mg total) by mouth 4 (four) times daily -  before meals and at bedtime. What changed: when to take this   diphenoxylate-atropine 2.5-0.025 MG tablet Commonly known as: LOMOTIL 1 tablet. Pt takes PRN   Eliquis 2.5 MG Tabs tablet Generic drug: apixaban TAKE 1 TABLET TWICE A DAY   finasteride 5 MG tablet Commonly known as: PROSCAR Take 5 mg by mouth daily.   fish oil-omega-3 fatty acids 1000 MG capsule Take 1 g by mouth daily.   furosemide 20 MG tablet Commonly known as:  LASIX Take 1 tablet (20 mg total) by mouth as directed.   latanoprost 0.005 % ophthalmic solution Commonly known as: XALATAN Place 1 drop into both eyes at bedtime.   potassium chloride 10 MEQ tablet Commonly known as: KLOR-CON TAKE 1 TABLET BY MOUTH EVERY DAY   PRESERVISION AREDS PO Take 1 tablet by mouth 2 (two) times daily.   PROBIOTIC PO Take 1 tablet by mouth daily.   tamsulosin 0.4 MG Caps capsule Commonly known as: FLOMAX TAKE 1 CAPSULE BY MOUTH ONCE DAILY AFTER SUPPER What changed: See the new instructions.   timolol 0.5 % ophthalmic solution Commonly known as: TIMOPTIC Place 1 drop into both eyes 2 (two) times daily.   VANADYL SULFATE PO Take 1 capsule by mouth every evening.   zinc gluconate 50 MG tablet Take 50 mg by mouth daily.       No results found for this or any previous visit (from the past 24 hour(s)). No results found.   ROS: Negative, with the exception of above mentioned in HPI  Objective:  BP (!) 144/72 (BP Location: Right Arm, Patient Position: Sitting, Cuff Size: Normal)   Pulse 69   Temp 97.6 F (36.4 C) (Temporal)   Resp 17   Ht '5\' 8"'  (1.727 m)   Wt 157 lb (71.2 kg)   SpO2 99%   BMI 23.87 kg/m  Body mass index is 23.87 kg/m.  Gen: Afebrile. No acute distress.  Nontoxic in presentation, well-developed, well-nourished, Caucasian male.  Very pleasant. HENT: AT. [place].  Eyes:Pupils Equal Round Reactive to light, Extraocular movements intact,  Conjunctiva without redness, discharge or icterus.  Ectropion present bilaterally Neck/lymp/endocrine: Supple, no lymphadenopathy, no thyromegaly CV: RRR no murmur, no edema Chest: CTAB, no wheeze or crackles Neuro:  Normal gait. PERLA. EOMi. Alert. Oriented x3  Psych: Normal affect, dress and demeanor. Normal speech. Normal thought content and judgment.   Assessment/Plan: [name] is a 86 y.o. male present for OV for  Essential hypertension/Paroxysmal atrial fibrillation (HCC)/Stage 3  chronic kidney disease/HLD//CHF/CAD/PVD Stable. -Continue amlodipine 5 mg daily -Continue Coreg 6.25 mg twice daily. -Continue Eliquis -  Continue Lasix 20 mg PRN- using about once weekly.  QD PRN depending upon weight and fluid ( 3lb & fluid rule).  - continue  fish oil supplementation -  he  has difficulty controlling potassium on ace/arb- so lisinopril was dc'd in the past 2/2 to hyperkalemia - now he is on a potasium supplement daily to maintain level> continue K-Dur 10 mEq daily. - Low-sodium diet.  -  He BP has been  very difficult to control at times and his hydration and IDA/epo injections cause fluctuations leading to both highs and lower than desired readings. Certainly would rather mildly elevated BP over too low.  - Follow-up 6 months, as long as seeing Dr. Johnsie Cancel routinely as well.  Anemias IDA and EPO:  - managed by heme/onc.  Patient reports he is doing well-reviewed recent labs.  Prediabetes Patient has been very well diet controlled. 5.7> 6.4> 6.5 today Continue dietary modifications and exercise. New glucose monitoring kit called in for him today secondary to insurance formulary change.  Gout, unspecified cause, unspecified chronicity, unspecified site Stable. Continue renally dosed allopurinol  Hematuria/BPH: On occasions managed by urology currently taking Flomax 0.4 mg daily and Proscar 5 mg daily.  He reports recent hematuria with cystoscopy showing bladder polyps.  He states they are going to have a biopsy of his polyps in the upcoming weeks.  Orders Placed This Encounter  Procedures  . POCT glycosylated hemoglobin (Hb A1C)   Meds ordered this encounter  Medications  . blood glucose meter kit and supplies    Sig: Dispense based on patient and insurance preference. Use up to four times daily as directed. R73.03    Dispense:  1 each    Refill:  0    Order Specific Question:   Number of strips    Answer:   100    Order Specific Question:   Number of  lancets    Answer:   100  . allopurinol (ZYLOPRIM) 300 MG tablet    Sig: Take 0.5 tablets (150 mg total) by mouth daily.    Dispense:  45 tablet    Refill:  3  . carvedilol (COREG) 6.25 MG tablet    Sig: Take 1 tablet (6.25 mg total) by mouth 2 (two) times daily with a meal.    Dispense:  180 tablet    Refill:  3   Referral Orders  No referral(s) requested today   Of note patient wanted to wait on refills on Lasix and Bentyl which [facility].  May refill if he calls in as long as seen within last 6 months.  Follow-up every 6 months electronically signed by:  Howard Pouch, DO  [place]

## 2019-06-28 NOTE — Patient Instructions (Signed)
Great to see you today.  Follow up in 6 months.

## 2019-06-30 ENCOUNTER — Encounter: Payer: Self-pay | Admitting: Family Medicine

## 2019-06-30 DIAGNOSIS — R319 Hematuria, unspecified: Secondary | ICD-10-CM | POA: Insufficient documentation

## 2019-06-30 HISTORY — DX: Hematuria, unspecified: R31.9

## 2019-07-02 NOTE — Progress Notes (Signed)
Cardiology Office Note   Date:  07/06/2019   ID:  [name], DOB 31-Aug-1928, MRN [id number]  PCP:  William Hillock, DO  Cardiologist:Dr. Johnsie Cancel, MD  Chief Complaint  Patient presents with  . Pre-op Exam     History of Present Illness: [name] is a 86 y.o. male who presents for preoperative cardiac clearance for bladder tumor removal. Surgical team is asking to hold Eliquis 48 hours prior to procedure  [name] has a history of atrial fibrillation, CHB status post St. Jude Assurity  MRI compatible PPM placed 07/28/2017, chronic anticoagulation, CAD status post CABG in 1999 (LIMA to LAD, SVG to IM sequenced to OM1/OM 2, SVG D1 and SVG PDA)  Last cardiac cath in 2010 with patent grafts. Nuclear stress test 08/2015 with scar no ischemia and LVEF at 70%.  Carotid Doppler 10/02/2017 with no stenosis. Echocardiogram 04/03/2017 EF 55 to 60% with mild to moderate MR, moderate LAE and mild RAE.  [name] was last seen by Dr. Johnsie [name] 08/05/2018 at which time he appeared stable from a CV standpoint. He was then seen by Dr. Rayann [name] in follow-up 01/24/2019.  At that time he was reported as active without symptoms of arrhythmia and was going to Silver sneakers twice per week without difficulty.  PPM download 05/02/2019 with normal device function and known persistent atrial fibrillation on Eliquis.  Today, he comes for preoperative clearance for bladder polyp removal.  He is doing very well from a CV standpoint.  Continues to be very active, going to Silver sneakers 3 times per week without anginal symptoms.  He denies shortness of breath, LE edema, orthopnea symptoms. BP is a little elevated however reports home BPs are more stable.  Recently saw his PCP last week for wellness exam and for lab work. Upon evaluation today, he can achieve 4 METs or greater without anginal symptoms. According to [facility] and AHA guidelines, he requires no further cardiac workup prior to her noncardiac surgery and  should be at acceptable risk. His revised cardiac risk of peri-procedural MI or cardiac arrest following knee replacement surgery is low.    Per pharmacy and office protocol, patient can hold Eliquis for 2 days prior to procedure and resume when safe from a surgical standpoint.   Past Medical History:  Diagnosis Date  . A-fib ([place])   . Basal cell carcinoma    skin  . Bigeminal rhythm   . Bradycardia 06/21/2017  . Cataract   . Chronic renal insufficiency, stage 3 (moderate) 2018   GFR 30s-40s  . Coronary artery disease    post bypass  . CVD (cardiovascular disease)   . Diabetes mellitus without complication ([place])   . Diverticular disease   . Easy bruising   . 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 Gout   . Hernia   . Hyperlipidemia   . Hypertension   . IBS (irritable bowel syndrome)   . Iron deficiency anemia 02/04/2016  . Macular degeneration   . Microscopic colitis   . PVD (peripheral vascular disease) ([place])     Past Surgical History:  Procedure Laterality Date  . CARDIAC CATHETERIZATION  2006  . CARDIOVERSION N/A 02/22/2013   Procedure: CARDIOVERSION;  Surgeon: William Hector, MD;  Location[name] ENDOSCOPY;  Service: Cardiovascular;  Laterality: N/A;  . CARDIOVERSION N/A 11/15/2014   Procedure: CARDIOVERSION;  Surgeon: William Hector, MD;  Location: [facility] ENDOSCOPY;  Service: Cardiovascular;  Laterality: N/A;  . CARDIOVERSION N/A 04/01/2017  Procedure: CARDIOVERSION;  Surgeon: William Dresser, MD;  Location: [facility] ENDOSCOPY;  Service: Cardiovascular;  Laterality: N/A;  . CAROTID ENDARTERECTOMY  2009/ 1993   left/ right  . COLONOSCOPY  03/17/2005   The colon is normal.  . CORONARY ARTERY BYPASS GRAFT  1999  . ESOPHAGOGASTRODUODENOSCOPY  03/17/2005   Normal esophagus. Normal Stomanch. Normal duodenum.   Marland Kitchen EYE SURGERY     eyelid repair  . INGUINAL HERNIA REPAIR  02/11/2012   Procedure: LAPAROSCOPIC BILATERAL INGUINAL HERNIA REPAIR;  Surgeon: William Earls, MD;   Location: [facility] ORS;  Service: General;  Laterality: Bilateral;  . PACEMAKER IMPLANT N/A 07/28/2017   St Jude Medical Assurity MRI conditional  dual-chamber pacemaker for symptomatic second degree AV block by Dr [name]  . SKIN CANCER EXCISION     right ear x 3     Current Outpatient Medications  Medication Sig Dispense Refill  . ACCU-CHEK FASTCLIX LANCETS MISC Check fasting blood sugar once daily 100 each 3  . allopurinol (ZYLOPRIM) 300 MG tablet Take 0.5 tablets (150 mg total) by mouth daily. 45 tablet 3  . amLODipine (NORVASC) 5 MG tablet Take 1 tablet (5 mg total) by mouth daily. 90 tablet 3  . atorvastatin (LIPITOR) 20 MG tablet Take 1 tablet (20 mg total) by mouth at bedtime. 90 tablet 3  . blood glucose meter kit and supplies Dispense based on patient and insurance preference. Use up to four times daily as directed. R73.03 1 each 0  . carvedilol (COREG) 6.25 MG tablet Take 1 tablet (6.25 mg total) by mouth 2 (two) times daily with a meal. 180 tablet 3  . Cholecalciferol 125 MCG (5000 UT) capsule Take 5,000 Units by mouth daily.    . CHROMIUM GTF PO Take 1 capsule by mouth every evening.     . Cinnamon 500 MG TABS Take 2 tablets by mouth at bedtime.     . Coenzyme Q10 (CO Q 10) 100 MG CAPS Take 100 mg by mouth daily.     Marland Kitchen dicyclomine (BENTYL) 10 MG capsule Take 1 capsule (10 mg total) by mouth 4 (four) times daily -  before meals and at bedtime. 360 capsule 3  . diphenoxylate-atropine (LOMOTIL) 2.5-0.025 MG tablet 1 tablet. Pt takes PRN    . ELIQUIS 2.5 MG TABS tablet TAKE 1 TABLET TWICE A DAY 180 tablet 1  . finasteride (PROSCAR) 5 MG tablet Take 5 mg by mouth daily.  3  . fish oil-omega-3 fatty acids 1000 MG capsule Take 1 g by mouth daily.     Marland Kitchen glucose blood (ACCU-CHEK GUIDE) test strip Check fasting blood sugar once daily 100 each 0  . latanoprost (XALATAN) 0.005 % ophthalmic solution Place 1 drop into both eyes at bedtime.    . Multiple Vitamins-Minerals (PRESERVISION AREDS PO) Take  1 tablet by mouth 2 (two) times daily.     . potassium chloride (KLOR-CON) 10 MEQ tablet TAKE 1 TABLET BY MOUTH EVERY DAY 90 tablet 3  . Probiotic Product (PROBIOTIC PO) Take 1 tablet by mouth daily.    . tamsulosin (FLOMAX) 0.4 MG CAPS capsule TAKE 1 CAPSULE BY MOUTH ONCE DAILY AFTER SUPPER (Patient taking differently: TAKE 2 CAPSULES BY MOUTH ONCE DAILY AFTER SUPPER) 90 capsule 3  . timolol (TIMOPTIC) 0.5 % ophthalmic solution Place 1 drop into both eyes 2 (two) times daily.     Marland Kitchen VANADYL SULFATE PO Take 1 capsule by mouth every evening.     . zinc gluconate 50 MG tablet  Take 50 mg by mouth daily.     . furosemide (LASIX) 20 MG tablet Take 1 tablet (20 mg total) by mouth as directed. 90 tablet 3   No current facility-administered medications for this visit.    Allergies:   Penicillins and Vancomycin    Social History:  The patient  reports that he quit smoking about 57 years ago. He quit smokeless tobacco use about 50 years ago.  His smokeless tobacco use included chew. He reports that he does not drink alcohol or use drugs.   Family History:  The patient's family history includes Coronary artery disease in his father; Heart disease in his father; Melanoma in his sister; Stroke in his mother.    ROS:  Please see the history of present illness. Otherwise, review of systems are positive for none.   All other systems are reviewed and negative.    PHYSICAL EXAM: VS:  BP (!) 150/66   Pulse 71   Ht '5\' 8"'  (1.727 m)   Wt 156 lb (70.8 kg)   SpO2 98%   BMI 23.72 kg/m  , BMI Body mass index is 23.72 kg/m.   General: Well developed, well nourished, NAD Neck: Negative for carotid bruits. No JVD Lungs:Clear to ausculation bilaterally. No wheezes, rales, or rhonchi. Breathing is unlabored. Cardiovascular: RRR with S1 S2. No murmurs, rubs, gallops, or LV heave appreciated. Extremities: No edema.  Radial pulses 2+ bilaterally Neuro: Alert and oriented. No focal deficits. No facial asymmetry.  MAE spontaneously. Psych: Responds to questions appropriately with normal affect.     EKG:  EKG is ordered today. The ekg ordered today demonstrates AF with AV pacing and no acute ischemic changes, similar to prior tracing   Recent Labs: 05/05/2019: ALT 38; BUN 35; Creatinine 1.67; Hemoglobin 12.3; Platelet Count 147; Potassium 4.4; Sodium 141    Lipid Panel    Component Value Date/Time   CHOL 110 06/21/2017 0420   TRIG 44 06/21/2017 0420   HDL 46 06/21/2017 0420   CHOLHDL 2.4 06/21/2017 0420   VLDL 9 06/21/2017 0420   LDLCALC 55 06/21/2017 0420   LDLDIRECT 49 01/15/2017 1031     Wt Readings from Last 3 Encounters:  07/06/19 156 lb (70.8 kg)  06/28/19 157 lb (71.2 kg)  03/17/19 156 lb 8 oz (71 kg)    Other studies Reviewed: Additional studies/ records that were reviewed today include:   [place] 2010:   Saphenous vein graft angiography:  1. Saphenous vein graft is sequenced to ramus intermedius, first OM,      second OM.  This graft is widely patent with no significant      stenosis.  The intermediate is a very small branch.  The OMs are      moderate size branches.  Distally, the OMs had no significant      stenosis present, and the left circumflex system fills in      retrograde fashion from the graft.  2. Saphenous vein graft to first diagonal:  This graft is widely      patent with no significant stenosis.  3. Saphenous vein graft sequenced to the right PDA and right      posterolateral branches is widely patent.  There is no significant      stenosis present.  The right coronary artery fills in retrograde      fashion from the graft.  4. LIMA to LAD is widely patent.  Anastomotic site of the mid LAD is  patent.  There is no significant stenosis throughout.   Left ventriculography:  There is mild diffuse hypokinesis of the left  ventricle.  LVEF is estimated at 45-50%.  There is no significant mitral  regurgitation.   ASSESSMENT:  1. Severe native  three-vessel coronary artery disease as outlined.  2. Status post coronary bypass surgery with 7/7 grafts widely patent.  3. Mild left ventricular dysfunction with an estimated left      ventricular ejection fraction 45-50%.   PLAN:  Recommend ongoing medical therapy.  The patient has had an  excellent revascularization and is now over 10 years out from surgery  with all grafts wide open.  ASSESSMENT AND PLAN:  1.  PAF: -Followed by Dr. Rayann [name] with plans to continue low-dose Eliquis and focus on rate control -CHA2DS2VASc = 5 -Last atrial fibrillation burden noted to be 84% per last EP note -Plan was to continue with long-term rate control  2.  PPM placement secondary to CHB: -Saint Jude MRI compatible -Last download showed normal pacer function with remote up-to-date  3.  CAD without angina: -Remote CABG as above with no recent anginal symptoms -Last Lexiscan Myoview stress test 2017 with no ischemia  4.  Anemia: -Follows with Dr. Marin Olp and receives Aranesp and iron replacement as needed  5.  Chronic renal dysfunction, stage III: -Baseline creatinine appears to be in the 1.6-1.7 range -Elevated however stable  6.  Preoperative exam for bladder polyp removal: -The patient does not have any unstable cardiac conditions. Upon evaluation today, he can achieve 4 METs or greater without anginal symptoms.  According to [facility] and AHA guidelines, he requires no further cardiac workup prior to her noncardiac surgery and should be at acceptable risk.   -Per pharmacy and office protocol, patient can hold Eliquis for 2 days prior to procedure and resume when safe from a surgical standpoint.    Current medicines are reviewed at length with the patient today.  The patient does not have concerns regarding medicines.  The following changes have been made:  no change  Labs/ tests ordered today include: None   Orders Placed This Encounter  Procedures  . EKG 12-Lead     Disposition:    FU with Dr.Nishan  In 1 year  Signed, Kathyrn Drown, NP  07/06/2019 12:24 PM    [facility] [address], [place], [place]  [address] Phone: [phone]; Fax: [phone]

## 2019-07-06 ENCOUNTER — Ambulatory Visit (INDEPENDENT_AMBULATORY_CARE_PROVIDER_SITE_OTHER): Payer: Medicare Other | Admitting: Cardiology

## 2019-07-06 ENCOUNTER — Other Ambulatory Visit: Payer: Self-pay

## 2019-07-06 ENCOUNTER — Encounter: Payer: Self-pay | Admitting: Cardiology

## 2019-07-06 ENCOUNTER — Other Ambulatory Visit: Payer: Self-pay | Admitting: Urology

## 2019-07-06 VITALS — BP 150/66 | HR 71 | Ht 68.0 in | Wt 156.0 lb

## 2019-07-06 DIAGNOSIS — I4819 Other persistent atrial fibrillation: Secondary | ICD-10-CM | POA: Diagnosis not present

## 2019-07-06 DIAGNOSIS — I442 Atrioventricular block, complete: Secondary | ICD-10-CM | POA: Diagnosis not present

## 2019-07-06 DIAGNOSIS — I441 Atrioventricular block, second degree: Secondary | ICD-10-CM

## 2019-07-06 DIAGNOSIS — I5032 Chronic diastolic (congestive) heart failure: Secondary | ICD-10-CM | POA: Diagnosis not present

## 2019-07-06 MED ORDER — GEMCITABINE CHEMO FOR BLADDER INSTILLATION 2000 MG: 2000.0000 mg | Freq: Once | INTRAVENOUS | Status: DC

## 2019-07-06 NOTE — Patient Instructions (Signed)
Medication Instructions:   Your physician recommends that you continue on your current medications as directed. Please refer to the Current Medication list given to you today.  *If you need a refill on your cardiac medications before your next appointment, please call your pharmacy*  Lab Work:  None ordered today  If you have labs (blood work) drawn today and your tests are completely normal, you will receive your results only by: Marland Kitchen MyChart Message (if you have MyChart) OR . A paper copy in the mail If you have any lab test that is abnormal or we need to change your treatment, we will call you to review the results.  Testing/Procedures:  None ordered today  Follow-Up: At [facility], you and your health needs are our priority.  As part of our continuing mission to provide you with exceptional heart care, we have created designated Provider Care Teams.  These Care Teams include your primary Cardiologist (physician) and Advanced Practice Providers (APPs -  Physician Assistants and Nurse Practitioners) who all work together to provide you with the care you need, when you need it.  We recommend signing up for the patient portal called "MyChart".  Sign up information is provided on this After Visit Summary.  MyChart is used to connect with patients for Virtual Visits (Telemedicine).  Patients are able to view lab/test results, encounter notes, upcoming appointments, etc.  Non-urgent messages can be sent to your provider as well.   To learn more about what you can do with MyChart, go to [email or web address].    Your next appointment:   12 month(s)  The format for your next appointment:   In Person  Provider:   Jenkins Rouge, MD

## 2019-07-07 ENCOUNTER — Encounter (HOSPITAL_BASED_OUTPATIENT_CLINIC_OR_DEPARTMENT_OTHER): Payer: Self-pay | Admitting: Urology

## 2019-07-08 ENCOUNTER — Other Ambulatory Visit: Payer: Self-pay

## 2019-07-08 ENCOUNTER — Encounter (HOSPITAL_BASED_OUTPATIENT_CLINIC_OR_DEPARTMENT_OTHER): Payer: Self-pay | Admitting: Urology

## 2019-07-08 NOTE — Progress Notes (Addendum)
ADDENDUM:SPOKE WITH JESSICA ZANETTO PA, OK TO PROCEED PER JESSICA ZANETTO PA.   Spoke w/ via phone for pre-op interview--- Lab needs dos---- I stat 8             COVID test ------07-11-2019 at 250 pm Arrive at -------1230 pm 07-14-2019 No food after midnight, clear liquids until 830 am then npo Medications to take morning of surgery -----carvedilol, amlidipine, allopurinol, finasteride, timolol eye drop Diabetic medication -----n/a Patient Special Instructions ----- Pre-Op special Istructions ----- Patient verbalized understanding of instructions that were given at this phone interview. Patient denies shortness of breath, chest pain, fever, cough a this phone interview.  Anesthesia :pacemaker, blood thinner  Addendum: pacemaker orders received via fax from dr allred no rep needed for day of surgery.  Pacemaker orders placed on patient chart. Addendum: chart to jessica zanetto pa for review  PCP:dr renee [facility] Cardiologist :dr Jethro Bastos 08-05-2018 epic, cardiac clearance note 07-06-2019 jill mcdaniel np epic and on chart(faxed over by [facility]) Electrophsilogist: dr Meryl Crutch 01-24-2019 epic Last device check note 05-02-2019 epic Chest x-ray : none EKG :07-06-2019 epic Echo :04-03-2017 epic Cardiac Cath : 2006 (no results in epic) Sleep Study/ CPAP :n/a Fasting Blood Sugar :115      / Checks Blood Sugar - in am diet controlled diabetic   Blood Thinner/ Instructions /Last Dose:eliquis, note to stop eliquis 2 days prior to surgery jill mcdaniel np 07-06-2019 epic ASA / Instructions/ Last Dose : n/a  Patient denies shortness of breath, chest pain, fever, and cough at this phone interview.

## 2019-07-11 ENCOUNTER — Other Ambulatory Visit (HOSPITAL_COMMUNITY)
Admission: RE | Admit: 2019-07-11 | Discharge: 2019-07-11 | Disposition: A | Discharge status: Home / Self Care | Payer: Medicare Other | Source: Ambulatory Visit | Attending: Urology | Admitting: Urology

## 2019-07-11 DIAGNOSIS — Z01812 Encounter for preprocedural laboratory examination: Secondary | ICD-10-CM | POA: Diagnosis not present

## 2019-07-11 DIAGNOSIS — Z20822 Contact with and (suspected) exposure to covid-19: Secondary | ICD-10-CM | POA: Insufficient documentation

## 2019-07-11 LAB — SARS CORONAVIRUS 2 (TAT 6-24 HRS): SARS Coronavirus 2: NEGATIVE

## 2019-07-14 ENCOUNTER — Ambulatory Visit (HOSPITAL_BASED_OUTPATIENT_CLINIC_OR_DEPARTMENT_OTHER): Payer: Medicare Other | Admitting: Physician Assistant

## 2019-07-14 ENCOUNTER — Encounter (HOSPITAL_BASED_OUTPATIENT_CLINIC_OR_DEPARTMENT_OTHER): Payer: Self-pay | Admitting: Urology

## 2019-07-14 ENCOUNTER — Other Ambulatory Visit: Payer: Self-pay

## 2019-07-14 ENCOUNTER — Encounter (HOSPITAL_BASED_OUTPATIENT_CLINIC_OR_DEPARTMENT_OTHER)
Admission: RE | Disposition: A | Discharge status: Home / Self Care | Payer: Self-pay | Source: Home / Self Care | Attending: Urology

## 2019-07-14 ENCOUNTER — Ambulatory Visit (HOSPITAL_BASED_OUTPATIENT_CLINIC_OR_DEPARTMENT_OTHER)
Admission: RE | Admit: 2019-07-14 | Discharge: 2019-07-14 | Disposition: A | Discharge status: Home / Self Care | Payer: Medicare Other | Attending: Urology | Admitting: Urology

## 2019-07-14 DIAGNOSIS — I2510 Atherosclerotic heart disease of native coronary artery without angina pectoris: Secondary | ICD-10-CM | POA: Diagnosis not present

## 2019-07-14 DIAGNOSIS — E7849 Other hyperlipidemia: Secondary | ICD-10-CM | POA: Diagnosis not present

## 2019-07-14 DIAGNOSIS — C670 Malignant neoplasm of trigone of bladder: Secondary | ICD-10-CM | POA: Diagnosis not present

## 2019-07-14 DIAGNOSIS — Z951 Presence of aortocoronary bypass graft: Secondary | ICD-10-CM | POA: Insufficient documentation

## 2019-07-14 DIAGNOSIS — N183 Chronic kidney disease, stage 3 unspecified: Secondary | ICD-10-CM | POA: Insufficient documentation

## 2019-07-14 DIAGNOSIS — E1122 Type 2 diabetes mellitus with diabetic chronic kidney disease: Secondary | ICD-10-CM | POA: Insufficient documentation

## 2019-07-14 DIAGNOSIS — I4891 Unspecified atrial fibrillation: Secondary | ICD-10-CM | POA: Diagnosis not present

## 2019-07-14 DIAGNOSIS — E1151 Type 2 diabetes mellitus with diabetic peripheral angiopathy without gangrene: Secondary | ICD-10-CM | POA: Insufficient documentation

## 2019-07-14 DIAGNOSIS — C679 Malignant neoplasm of bladder, unspecified: Secondary | ICD-10-CM | POA: Diagnosis not present

## 2019-07-14 DIAGNOSIS — Z87891 Personal history of nicotine dependence: Secondary | ICD-10-CM | POA: Diagnosis not present

## 2019-07-14 DIAGNOSIS — Z85828 Personal history of other malignant neoplasm of skin: Secondary | ICD-10-CM | POA: Insufficient documentation

## 2019-07-14 DIAGNOSIS — R310 Gross hematuria: Secondary | ICD-10-CM

## 2019-07-14 DIAGNOSIS — I509 Heart failure, unspecified: Secondary | ICD-10-CM | POA: Insufficient documentation

## 2019-07-14 DIAGNOSIS — I129 Hypertensive chronic kidney disease with stage 1 through stage 4 chronic kidney disease, or unspecified chronic kidney disease: Secondary | ICD-10-CM | POA: Diagnosis not present

## 2019-07-14 DIAGNOSIS — C675 Malignant neoplasm of bladder neck: Secondary | ICD-10-CM | POA: Diagnosis not present

## 2019-07-14 DIAGNOSIS — C678 Malignant neoplasm of overlapping sites of bladder: Secondary | ICD-10-CM | POA: Diagnosis not present

## 2019-07-14 HISTORY — PX: TRANSURETHRAL RESECTION OF BLADDER TUMOR WITH MITOMYCIN-C: SHX6459

## 2019-07-14 HISTORY — PX: CYSTOSCOPY W/ RETROGRADES: SHX1426

## 2019-07-14 LAB — POCT I-STAT, CHEM 8
BUN: 29 mg/dL — ABNORMAL HIGH (ref 8–23)
Calcium, Ion: 1.26 mmol/L (ref 1.15–1.40)
Chloride: 110 mmol/L (ref 98–111)
Creatinine, Ser: 1.6 mg/dL — ABNORMAL HIGH (ref 0.61–1.24)
Glucose, Bld: 115 mg/dL — ABNORMAL HIGH (ref 70–99)
HCT: 35 % — ABNORMAL LOW (ref 39.0–52.0)
Hemoglobin: 11.9 g/dL — ABNORMAL LOW (ref 13.0–17.0)
Potassium: 4.2 mmol/L (ref 3.5–5.1)
Sodium: 141 mmol/L (ref 135–145)
TCO2: 20 mmol/L — ABNORMAL LOW (ref 22–32)

## 2019-07-14 LAB — GLUCOSE, CAPILLARY: Glucose-Capillary: 133 mg/dL — ABNORMAL HIGH (ref 70–99)

## 2019-07-14 SURGERY — TRANSURETHRAL RESECTION OF BLADDER TUMOR WITH MITOMYCIN-C
Anesthesia: General | Site: Renal

## 2019-07-14 MED ORDER — SODIUM CHLORIDE 0.9 % IR SOLN
Status: DC | PRN
  Administered 2019-07-14 (×4): 3000 mL via INTRAVESICAL

## 2019-07-14 MED ORDER — FENTANYL CITRATE (PF) 100 MCG/2ML IJ SOLN
25.0000 ug | INTRAMUSCULAR | Status: DC | PRN
  Filled 2019-07-14: qty 1

## 2019-07-14 MED ORDER — MEPERIDINE HCL 25 MG/ML IJ SOLN
6.2500 mg | INTRAMUSCULAR | Status: DC | PRN
  Filled 2019-07-14: qty 1

## 2019-07-14 MED ORDER — DEXAMETHASONE SODIUM PHOSPHATE 10 MG/ML IJ SOLN
INTRAMUSCULAR | Status: DC | PRN
  Administered 2019-07-14: 5 mg via INTRAVENOUS

## 2019-07-14 MED ORDER — BELLADONNA ALKALOIDS-OPIUM 16.2-30 MG RE SUPP
30.0000 mg | Freq: Once | RECTAL | Status: AC
  Administered 2019-07-14: 30 mg via RECTAL
  Filled 2019-07-14: qty 1

## 2019-07-14 MED ORDER — ACETAMINOPHEN 160 MG/5ML PO SOLN
325.0000 mg | ORAL | Status: DC | PRN
  Filled 2019-07-14: qty 20.3

## 2019-07-14 MED ORDER — IOHEXOL 300 MG/ML  SOLN
INTRAMUSCULAR | Status: DC | PRN
  Administered 2019-07-14: 8 mL

## 2019-07-14 MED ORDER — ONDANSETRON HCL 4 MG/2ML IJ SOLN
INTRAMUSCULAR | Status: DC | PRN
  Administered 2019-07-14: 4 mg via INTRAVENOUS

## 2019-07-14 MED ORDER — ONDANSETRON HCL 4 MG/2ML IJ SOLN
4.0000 mg | Freq: Once | INTRAMUSCULAR | Status: DC | PRN
  Filled 2019-07-14: qty 2

## 2019-07-14 MED ORDER — OXYCODONE HCL 5 MG/5ML PO SOLN
5.0000 mg | Freq: Once | ORAL | Status: DC | PRN
  Filled 2019-07-14: qty 5

## 2019-07-14 MED ORDER — CIPROFLOXACIN IN D5W 400 MG/200ML IV SOLN
400.0000 mg | INTRAVENOUS | Status: AC
  Administered 2019-07-14: 400 mg via INTRAVENOUS
  Filled 2019-07-14: qty 200

## 2019-07-14 MED ORDER — FENTANYL CITRATE (PF) 100 MCG/2ML IJ SOLN
INTRAMUSCULAR | Status: DC | PRN
  Administered 2019-07-14 (×2): 25 ug via INTRAVENOUS

## 2019-07-14 MED ORDER — LIDOCAINE 2% (20 MG/ML) 5 ML SYRINGE
INTRAMUSCULAR | Status: DC | PRN
  Administered 2019-07-14: 75 mg via INTRAVENOUS

## 2019-07-14 MED ORDER — DEXAMETHASONE SODIUM PHOSPHATE 10 MG/ML IJ SOLN
INTRAMUSCULAR | Status: AC
  Filled 2019-07-14: qty 1

## 2019-07-14 MED ORDER — OXYCODONE HCL 5 MG PO TABS
5.0000 mg | ORAL_TABLET | Freq: Once | ORAL | Status: DC | PRN
  Filled 2019-07-14: qty 1

## 2019-07-14 MED ORDER — PROPOFOL 10 MG/ML IV BOLUS
INTRAVENOUS | Status: DC | PRN
  Administered 2019-07-14: 120 mg via INTRAVENOUS
  Administered 2019-07-14: 30 mg via INTRAVENOUS

## 2019-07-14 MED ORDER — BELLADONNA ALKALOIDS-OPIUM 16.2-30 MG RE SUPP
RECTAL | Status: AC
  Filled 2019-07-14: qty 1

## 2019-07-14 MED ORDER — CIPROFLOXACIN IN D5W 400 MG/200ML IV SOLN
INTRAVENOUS | Status: AC
  Filled 2019-07-14: qty 200

## 2019-07-14 MED ORDER — ONDANSETRON HCL 4 MG/2ML IJ SOLN
INTRAMUSCULAR | Status: AC
  Filled 2019-07-14: qty 2

## 2019-07-14 MED ORDER — SODIUM CHLORIDE 0.9 % IV SOLN
INTRAVENOUS | Status: DC
  Filled 2019-07-14: qty 1000

## 2019-07-14 MED ORDER — GEMCITABINE CHEMO FOR BLADDER INSTILLATION 2000 MG
2000.0000 mg | Freq: Once | INTRAVENOUS | Status: AC
  Administered 2019-07-14: 2000 mg via INTRAVESICAL
  Filled 2019-07-14: qty 2000

## 2019-07-14 MED ORDER — FENTANYL CITRATE (PF) 100 MCG/2ML IJ SOLN
INTRAMUSCULAR | Status: AC
  Filled 2019-07-14: qty 2

## 2019-07-14 MED ORDER — ACETAMINOPHEN 325 MG PO TABS
325.0000 mg | ORAL_TABLET | ORAL | Status: DC | PRN
  Filled 2019-07-14: qty 2

## 2019-07-14 MED ORDER — LIDOCAINE 2% (20 MG/ML) 5 ML SYRINGE
INTRAMUSCULAR | Status: AC
  Filled 2019-07-14: qty 5

## 2019-07-14 MED ORDER — PROPOFOL 10 MG/ML IV BOLUS
INTRAVENOUS | Status: AC
  Filled 2019-07-14: qty 20

## 2019-07-14 SURGICAL SUPPLY — 37 items
BAG DRAIN URO-CYSTO SKYTR STRL (DRAIN) ×3 IMPLANT
BAG URINE DRAIN 2000ML AR STRL (UROLOGICAL SUPPLIES) IMPLANT
BAG URINE LEG 500ML (DRAIN) IMPLANT
BASKET ZERO TIP NITINOL 2.4FR (BASKET) IMPLANT
CATH FOLEY 2WAY SLVR  5CC 20FR (CATHETERS) ×3
CATH FOLEY 2WAY SLVR  5CC 22FR (CATHETERS)
CATH FOLEY 2WAY SLVR 5CC 20FR (CATHETERS) ×2 IMPLANT
CATH FOLEY 2WAY SLVR 5CC 22FR (CATHETERS) IMPLANT
CATH FOLEY 3WAY 30CC 22F (CATHETERS) IMPLANT
CATH INTERMIT  6FR 70CM (CATHETERS) ×3 IMPLANT
CLOTH BEACON ORANGE TIMEOUT ST (SAFETY) ×3 IMPLANT
ELECT REM PT RETURN 9FT ADLT (ELECTROSURGICAL) ×3
ELECTRODE REM PT RTRN 9FT ADLT (ELECTROSURGICAL) ×2 IMPLANT
EVACUATOR MICROVAS BLADDER (UROLOGICAL SUPPLIES) IMPLANT
FIBER LASER FLEXIVA 365 (UROLOGICAL SUPPLIES) IMPLANT
FIBER LASER TRAC TIP (UROLOGICAL SUPPLIES) IMPLANT
GLOVE BIO SURGEON STRL SZ8 (GLOVE) ×3 IMPLANT
GLOVE BIOGEL PI IND STRL 7.5 (GLOVE) ×4 IMPLANT
GLOVE BIOGEL PI INDICATOR 7.5 (GLOVE) ×2
GOWN STRL REUS W/ TWL XL LVL3 (GOWN DISPOSABLE) ×2 IMPLANT
GOWN STRL REUS W/TWL XL LVL3 (GOWN DISPOSABLE) ×6 IMPLANT
GUIDEWIRE ANG ZIPWIRE 038X150 (WIRE) IMPLANT
GUIDEWIRE STR DUAL SENSOR (WIRE) IMPLANT
HOLDER FOLEY CATH W/STRAP (MISCELLANEOUS) ×3 IMPLANT
IV NS IRRIG 3000ML ARTHROMATIC (IV SOLUTION) ×12 IMPLANT
KIT TURNOVER CYSTO (KITS) ×3 IMPLANT
LOOP CUT BIPOLAR 24F LRG (ELECTROSURGICAL) ×6 IMPLANT
MANIFOLD NEPTUNE II (INSTRUMENTS) ×3 IMPLANT
NS IRRIG 500ML POUR BTL (IV SOLUTION) ×3 IMPLANT
PACK CYSTO (CUSTOM PROCEDURE TRAY) ×3 IMPLANT
PLUG CATH AND CAP STER (CATHETERS) ×3 IMPLANT
SHEATH URETERAL 12FRX35CM (MISCELLANEOUS) IMPLANT
SYR TOOMEY IRRIG 70ML (MISCELLANEOUS) ×3
SYRINGE TOOMEY IRRIG 70ML (MISCELLANEOUS) ×2 IMPLANT
TUBE CONNECTING 12X1/4 (SUCTIONS) ×3 IMPLANT
TUBING UROLOGY SET (TUBING) IMPLANT
WATER STERILE IRR 500ML POUR (IV SOLUTION) ×3 IMPLANT

## 2019-07-14 NOTE — Op Note (Signed)
Preop diagnosis: Bladder cancer, bladder neck and left trigone and location  Postoperative diagnosis: Same, with contiguous bladder tumor at bladder neck being greater than 5 cm  Principal procedure: Cystoscopy, bilateral retrograde ureteropyelogram, fluoroscopic interpretation, transurethral resection of bladder tumor greater than 5 cm in diameter, placement of intravesical gemcitabine  Surgeon: Ileta Ofarrell  Anesthesia: General with LMA  Complications: None  Estimated blood loss: Less than 50 mL  Specimen: Bladder tumor fragments, to pathology  Drains: 37 French Foley catheter to straight drain  Indications: 86 year old male with recently diagnosed urothelial carcinoma of the bladder, mainly located in the bladder neck area upon recent cystoscopy.  This is papillary in nature.  He has had recurrent gross hematuria.  He presents at this time for resection of this urothelial carcinoma as well as retrograde pyelograms bilaterally and, if possible, placement of intravesical gemcitabine.  The patient is aware of the risks and complications as well as the expected outpatient nature of this procedure.  Risks and complications include but are not limited to infection, bleeding, reaction to the gemcitabine, need for catheter placement, anesthetic complications, among others.  He understands and desires to proceed.  Findings: Urethra was normal.  Prostatic urethra displayed moderate obstructive bilateral hypertrophy.  There were no lesions within the prostatic urethra.  Right ureteral orifice was normal, left with somewhat obscured within the papillary tumor in the left trigonal region.  There was a significant papillary lesion at the bladder neck extending from the 7 o'clock position in a counterclockwise direction to the 11 o'clock position.  This was approximately 1 to 2 cm wide.  The total length of this was over 5 cm.  There was a satellite lesion just to the left of the ureteral orifice on the left  side.  Retrograde ureteropyelogram's revealed normal ureters throughout, within ureters no filling defects and no stricture.  Pyelocalyceal systems were normal bilaterally.  Description of procedure: The patient was properly identified in the holding area.  He received preoperative IV antibiotics.  Was taken to the operating room where general anesthetic was administered with the LMA.  He was placed in the dorsolithotomy position.  Genitalia and perineum were prepped and draped.  Proper timeout was performed.  The urethra was dilated to 30 [place] with [employer].  48 French cystoscope was then passed into the bladder with the above-mentioned findings.  Left ureteral orifice was somewhat difficult to find but once found retrogrades were performed on that side and the right side with the above-mentioned findings.  I then advanced the 6 [place] open-ended catheter which was utilized for the retrograde studies up to the left renal pelvis so that I would be able to mark the left ureteral orifice.  This was left in place.  Following retrogrades, the cystoscope was removed.  Using the visual obturator the 26 French resectoscope sheath was passed.  The cutting loop and resectoscope element were then placed.  All tumor was then resected down into the muscular layer with the resectoscope using the cutting current.  Tissue fragments from the resection were then removed with the [facility] syringe.  All resected sites were carefully controlled with electrocautery using the coag current.  There were several areas with low-lying papillary lesions which were just cauterized.  Following coagulation, there was no bleeding.  The tissue fragments had been irrigated.  The scope was then removed.  I then passed a 20 [place] Foley catheter.  Balloon was filled with 10 mL of water.  The bladder was drained.  Catheter plug was placed.  At this point the patient was awakened and taken to the PACU.  He tolerated procedure well.  In the  PACU, 2 g of gemcitabine and diluent were placed into the bladder.  It was left indwelling for 1 hour and then released.

## 2019-07-14 NOTE — Anesthesia Procedure Notes (Signed)
Procedure Name: LMA Insertion Date/Time: 07/14/2019 2:40 PM Performed by: Lollie Sails, CRNA Pre-anesthesia Checklist: Patient identified, Emergency Drugs available, Suction available, Patient being monitored and Timeout performed Patient Re-evaluated:Patient Re-evaluated prior to induction Oxygen Delivery Method: Circle system utilized Preoxygenation: Pre-oxygenation with 100% oxygen Induction Type: IV induction Ventilation: Mask ventilation without difficulty LMA: LMA inserted Number of attempts: 1 Placement Confirmation: positive ETCO2 and breath sounds checked- equal and bilateral Tube secured with: Tape Dental Injury: Teeth and Oropharynx as per pre-operative assessment

## 2019-07-14 NOTE — Anesthesia Preprocedure Evaluation (Signed)
Anesthesia Evaluation  Patient identified by MRN, date of birth, ID band Patient awake    Reviewed: Allergy & Precautions, NPO status , Patient's Chart, lab work & pertinent test results  History of Anesthesia Complications Negative for: history of anesthetic complications  Airway Mallampati: II   Neck ROM: Full    Dental no notable dental hx.    Pulmonary neg pulmonary ROS, former smoker,    breath sounds clear to auscultation       Cardiovascular hypertension, Pt. on medications + CAD, + CABG, + Peripheral Vascular Disease and +CHF  + dysrhythmias Atrial Fibrillation  Rhythm:Irregular Rate:Normal     Neuro/Psych negative neurological ROS     GI/Hepatic   Endo/Other  diabetes  Renal/GU Renal Insufficiency and CRFRenal disease     Musculoskeletal   Abdominal   Peds  Hematology  (+) Blood dyscrasia, anemia ,   Anesthesia Other Findings   Reproductive/Obstetrics                             Anesthesia Physical  Anesthesia Plan  ASA: III  Anesthesia Plan: General   Post-op Pain Management:    Induction: Intravenous  PONV Risk Score and Plan: 2 and Treatment may vary due to age or medical condition, Dexamethasone and Ondansetron  Airway Management Planned: LMA  Additional Equipment:   Intra-op Plan:   Post-operative Plan: Extubation in OR  Informed Consent: I have reviewed the patients History and Physical, chart, labs and discussed the procedure including the risks, benefits and alternatives for the proposed anesthesia with the patient or authorized representative who has indicated his/her understanding and acceptance.       Plan Discussed with: CRNA and Anesthesiologist  Anesthesia Plan Comments: ( [name] has a history of atrial fibrillation, CHB status post St. Jude Assurity  MRI compatible PPM placed 07/28/2017, chronic anticoagulation, CAD status post CABG in 1999  (LIMA to LAD, SVG to IM sequenced to OM1/OM 2, SVG D1 and SVG PDA)  Last cardiac cath in 2010 with patent grafts. Nuclear stress test 08/2015 with scar no ischemia and LVEF at 70%.  Carotid Doppler 10/02/2017 with no stenosis. Echocardiogram 04/03/2017 EF 55 to 60% with mild to moderate MR, moderate LAE and mild RAE.  [name] was last seen by Dr. Johnsie Cancel 08/05/2018 at which time he appeared stable from a CV standpoint. He was then seen by Dr. Rayann Heman in follow-up 01/24/2019.  At that time he was reported as active without symptoms of arrhythmia and was going to Silver sneakers twice per week without difficulty.)        Anesthesia Quick Evaluation

## 2019-07-14 NOTE — Discharge Instructions (Signed)
1. You may see some blood in the urine and may have some burning with urination for 48-72 hours. You also may notice that you have to urinate more frequently or urgently after your procedure which is normal.  2. You should call should you develop an inability urinate, fever > 101, persistent nausea and vomiting that prevents you from eating or drinking to stay hydrated.  3. If you have a catheter, you will be taught how to take care of the catheter by the nursing staff prior to discharge from the hospital.  If the urine is fairly clear by Friday morning, you may remove the catheter as instructed by the nurses.  You may periodically feel a strong urge to void with the catheter in place.  This is a bladder spasm and most often can occur when having a bowel movement or moving around. It is typically self-limited and usually will stop after a few minutes.  You may use some Vaseline or Neosporin around the tip of the catheter to reduce friction at the tip of the penis. You may also see some blood in the urine.  A very small amount of blood can make the urine look quite red.  As long as the catheter is draining well, there usually is not a problem.  However, if the catheter is not draining well and is bloody, you should call the office [phone]) to notify us.        4.  You may resume Eliquis once your urine turns yellow.  Indwelling Urinary Catheter Care, Adult An indwelling urinary catheter is a thin tube that is put into your bladder. The tube helps to drain pee (urine) out of your body. The tube goes in through your urethra. Your urethra is where pee comes out of your body. Your pee will come out through the catheter, then it will go into a bag (drainage bag). Take good care of your catheter so it will work well. How to wear your catheter and bag Supplies needed  Sticky tape (adhesive tape) or a leg strap.  Alcohol wipe or soap and water (if you use tape).  A clean towel (if you use tape).  Large  overnight bag.  Smaller bag (leg bag). Wearing your catheter Attach your catheter to your leg with tape or a leg strap.  Make sure the catheter is not pulled tight.  If a leg strap gets wet, take it off and put on a dry strap.  If you use tape to hold the bag on your leg: 1. Use an alcohol wipe or soap and water to wash your skin where the tape made it sticky before. 2. Use a clean towel to pat-dry that skin. 3. Use new tape to make the bag stay on your leg. Wearing your bags You should have been given a large overnight bag.  You may wear the overnight bag in the day or night.  Always have the overnight bag lower than your bladder.  Do not let the bag touch the floor.  Before you go to sleep, put a clean plastic bag in a wastebasket. Then hang the overnight bag inside the wastebasket. You should also have a smaller leg bag that fits under your clothes.  Always wear the leg bag below your knee.  Do not wear your leg bag at night. How to care for your skin and catheter Supplies needed  A clean washcloth.  Water and mild soap.  A clean towel. Caring for your skin and  catheter      Clean the skin around your catheter every day: 1. Wash your hands with soap and water. 2. Wet a clean washcloth in warm water and mild soap. 3. Clean the skin around your urethra.  If you are male:  Gently spread the folds of skin around your vagina (labia).  With the washcloth in your other hand, wipe the inner side of your labia on each side. Wipe from front to back.  If you are male:  Pull back any skin that covers the end of your penis (foreskin).  With the washcloth in your other hand, wipe your penis in small circles. Start wiping at the tip of your penis, then move away from the catheter.  Move the foreskin back in place, if needed. 4. With your free hand, hold the catheter close to where it goes into your body.  Keep holding the catheter during cleaning so it does not get  pulled out. 5. With the washcloth in your other hand, clean the catheter.  Only wipe downward on the catheter.  Do not wipe upward toward your body. Doing this may push germs into your urethra and cause infection. 6. Use a clean towel to pat-dry the catheter and the skin around it. Make sure to wipe off all soap. 7. Wash your hands with soap and water.  Shower every day. Do not take baths.  Do not use cream, ointment, or lotion on the area where the catheter goes into your body, unless your doctor tells you to.  Do not use powders, sprays, or lotions on your genital area.  Check your skin around the catheter every day for signs of infection. Check for: ? Redness, swelling, or pain. ? Fluid or blood. ? Warmth. ? Pus or a bad smell. How to empty the bag Supplies needed  Rubbing alcohol.  Gauze pad or cotton ball.  Tape or a leg strap. Emptying the bag Pour the pee out of your bag when it is ?- full, or at least 2-3 times a day. Do this for your overnight bag and your leg bag. 1. Wash your hands with soap and water. 2. Separate (detach) the bag from your leg. 3. Hold the bag over the toilet or a clean pail. Keep the bag lower than your hips and bladder. This is so the pee (urine) does not go back into the tube. 4. Open the pour spout. It is at the bottom of the bag. 5. Empty the pee into the toilet or pail. Do not let the pour spout touch any surface. 6. Put rubbing alcohol on a gauze pad or cotton ball. 7. Use the gauze pad or cotton ball to clean the pour spout. 8. Close the pour spout. 9. Attach the bag to your leg with tape or a leg strap. 10. Wash your hands with soap and water. Follow instructions for cleaning the drainage bag:  From the product maker.  As told by your doctor. How to change the bag Supplies needed  Alcohol wipes.  A clean bag.  Tape or a leg strap. Changing the bag Replace your bag when it starts to leak, smell bad, or look dirty. 1. Wash  your hands with soap and water. 2. Separate the dirty bag from your leg. 3. Pinch the catheter with your fingers so that pee does not spill out. 4. Separate the catheter tube from the bag tube where these tubes connect (at the connection valve). Do not let the tubes touch any  surface. 5. Clean the end of the catheter tube with an alcohol wipe. Use a different alcohol wipe to clean the end of the bag tube. 6. Connect the catheter tube to the tube of the clean bag. 7. Attach the clean bag to your leg with tape or a leg strap. Do not make the bag tight on your leg. 8. Wash your hands with soap and water. General rules   Never pull on your catheter. Never try to take it out. Doing that can hurt you.  Always wash your hands before and after you touch your catheter or bag. Use a mild, fragrance-free soap. If you do not have soap and water, use hand sanitizer.  Always make sure there are no twists or bends (kinks) in the catheter tube.  Always make sure there are no leaks in the catheter or bag.  Drink enough fluid to keep your pee pale yellow.  Do not take baths, swim, or use a hot tub.  If you are male, wipe from front to back after you poop (have a bowel movement). Contact a doctor if:  Your pee is cloudy.  Your pee smells worse than usual.  Your catheter gets clogged.  Your catheter leaks.  Your bladder feels full. Get help right away if:  You have redness, swelling, or pain where the catheter goes into your body.  You have fluid, blood, pus, or a bad smell coming from the area where the catheter goes into your body.  Your skin feels warm where the catheter goes into your body.  You have a fever.  You have pain in your: ? Belly (abdomen). ? Legs. ? Lower back. ? Bladder.  You see blood in the catheter.  Your pee is pink or red.  You feel sick to your stomach (nauseous).  You throw up (vomit).  You have chills.  Your pee is not draining into the bag.  Your  catheter gets pulled out. Summary  An indwelling urinary catheter is a thin tube that is placed into the bladder to help drain pee (urine) out of the body.  The catheter is placed into the part of the body that drains pee from the bladder (urethra).  Taking good care of your catheter will keep it working properly and help prevent problems.  Always wash your hands before and after touching your catheter or bag.  Never pull on your catheter or try to take it out. This information is not intended to replace advice given to you by your health care provider. Make sure you discuss any questions you have with your health care provider. Document Revised: 08/06/2018 Document Reviewed: 11/28/2016 Elsevier Patient Education  [place] Instructions  Activity: Get plenty of rest for the remainder of the day. A responsible individual must stay with you for 24 hours following the procedure.  For the next 24 hours, DO NOT: -Drive a car -Paediatric nurse -Drink alcoholic beverages -Take any medication unless instructed by your physician -Make any legal decisions or sign important papers.  Meals: Start with liquid foods such as gelatin or soup. Progress to regular foods as tolerated. Avoid greasy, spicy, heavy foods. If nausea and/or vomiting occur, drink only clear liquids until the nausea and/or vomiting subsides. Call your physician if vomiting continues.  Special Instructions/Symptoms: Your throat may feel dry or sore from the anesthesia or the breathing tube placed in your throat during surgery. If this causes discomfort, gargle with warm salt water.  The discomfort should disappear within 24 hours.

## 2019-07-14 NOTE — Interval H&P Note (Signed)
History and Physical Interval Note:  07/14/2019 2:32 PM  [name]  has presented today for surgery, with the diagnosis of BLADDER CANCER.  The various methods of treatment have been discussed with the patient and family. After consideration of risks, benefits and other options for treatment, the patient has consented to  Procedure(s) with comments: TRANSURETHRAL RESECTION OF BLADDER TUMOR WITH MITOMYCIN-C (N/A) - 1 HR CYSTOSCOPY WITH RETROGRADE PYELOGRAM (Bilateral) as a surgical intervention.  The patient's history has been reviewed, patient examined, no change in status, stable for surgery.  I have reviewed the patient's chart and labs.  Questions were answered to the patient's satisfaction.     Lillette Boxer Smayan Hackbart

## 2019-07-14 NOTE — Transfer of Care (Signed)
Immediate Anesthesia Transfer of Care Note  Patient: [name]  Procedure(s) Performed: TRANSURETHRAL RESECTION OF BLADDER TUMOR WITH GEMCITABINE IN PACU (N/A Bladder) CYSTOSCOPY WITH RETROGRADE PYELOGRAM (Bilateral Renal)  Patient Location: PACU  Anesthesia Type:General  Level of Consciousness: awake  Airway & Oxygen Therapy: Patient Spontanous Breathing and Patient connected to nasal cannula oxygen  Post-op Assessment: Report given to RN and Post -op Vital signs reviewed and stable  Post vital signs: Reviewed and stable  Last Vitals:  Vitals Value Taken Time  BP 145/95 07/14/19 1532  Temp    Pulse 70 07/14/19 1536  Resp 16 07/14/19 1536  SpO2 99 % 07/14/19 1536  Vitals shown include unvalidated device data.  Last Pain:  Vitals:   07/14/19 1258  TempSrc:   PainSc: 0-No pain      Patients Stated Pain Goal: 5 (14/10/30 1314)  Complications: No apparent anesthesia complications

## 2019-07-14 NOTE — H&P (Signed)
H&P  Chief Complaint: Bladder cancer  History of Present Illness: 86 year old male presents at this time for TURBT.  He recently presented with gross hematuria.  Evaluation revealed circumferential papillary tumor at the bladder neck level.  The remaining urothelium was normal.  He presents at this time for cystoscopy, bilateral retrograde ureteropyelogram's, transurethral resection of bladder tumor with placement of intravesical gemcitabine postoperatively.  Past Medical History:  Diagnosis Date  . A-fib ([place])   . Basal cell carcinoma    skin  . Bigeminal rhythm   . Bradycardia 06/21/2017  . Cataract   . Chronic renal insufficiency, stage 3 (moderate) 2018   GFR 30s-40s  . Coronary artery disease    post bypass  . CVD (cardiovascular disease)   . Diabetes mellitus without complication ([place])    type 2 diet controlled  . Diverticular disease   . Easy bruising   . 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 Gout   . Hernia   . Hyperlipidemia   . Hypertension   . IBS (irritable bowel syndrome)   . Iron deficiency anemia 02/04/2016  . Macular degeneration   . Microscopic colitis   . PVD (peripheral vascular disease) ([place])     Past Surgical History:  Procedure Laterality Date  . CARDIAC CATHETERIZATION  2006  . CARDIOVERSION N/A 02/22/2013   Procedure: CARDIOVERSION;  Surgeon: [name], MD;  Location: [facility] ENDOSCOPY;  Service: Cardiovascular;  Laterality: N/A;  . CARDIOVERSION N/A 11/15/2014   Procedure: CARDIOVERSION;  Surgeon: [name], MD;  Location: [facility] ENDOSCOPY;  Service: Cardiovascular;  Laterality: N/A;  . CARDIOVERSION N/A 04/01/2017   Procedure: CARDIOVERSION;  Surgeon: Larey Dresser, MD;  Location: [place];  Service: Cardiovascular;  Laterality: N/A;  . CAROTID ENDARTERECTOMY  2009/ 1993   left/ right  . COLONOSCOPY  03/17/2005   The colon is normal.  . CORONARY ARTERY BYPASS GRAFT  1999  . ESOPHAGOGASTRODUODENOSCOPY  03/17/2005   Normal  esophagus. Normal Stomanch. Normal duodenum.   Marland Kitchen EYE SURGERY     eyelid repair  . INGUINAL HERNIA REPAIR  02/11/2012   Procedure: LAPAROSCOPIC BILATERAL INGUINAL HERNIA REPAIR;  Surgeon: Pedro Earls, MD;  Location: [facility] ORS;  Service: General;  Laterality: Bilateral;  . PACEMAKER IMPLANT N/A 07/28/2017   [employer] Medical Assurity MRI conditional  dual-chamber pacemaker for symptomatic second degree AV block by Dr Rayann Heman  . SKIN CANCER EXCISION     right ear x 3    Home Medications:    Allergies:  Allergies  Allergen Reactions  . Penicillins Hives    Has patient had a PCN reaction causing immediate rash, facial/tongue/throat swelling, SOB or lightheadedness with hypotension: No Has patient had a PCN reaction causing severe rash involving mucus membranes or skin necrosis: Yes Has patient had a PCN reaction that required hospitalization: No Has patient had a PCN reaction occurring within the last 10 years: No If all of the above answers are "NO", then may proceed with Cephalosporin use.   . Vancomycin Other (See Comments)    Edema and myalgia    Family History  Problem Relation Age of Onset  . Stroke Mother   . Coronary artery disease Father   . Heart disease Father   . Melanoma Sister   . Colon cancer Neg Hx     Social History:  reports that he quit smoking about 57 years ago. He quit smokeless tobacco use about 50 years ago.  His smokeless tobacco use included chew. He reports that  he does not drink alcohol or use drugs.  ROS: A complete review of systems was performed.  All systems are negative except for pertinent findings as noted.  Physical Exam:  Vital signs in last 24 hours: Ht 5\' 8"  (1.727 m)   Wt 68.9 kg   BMI 23.11 kg/m  Constitutional:  Alert and oriented, No acute distress Cardiovascular: Regular rate  Respiratory: Normal respiratory effort GI: Abdomen is soft, nontender, nondistended, no abdominal masses. No CVAT.  Genitourinary: Normal male phallus,  testes are descended bilaterally and non-tender and without masses, scrotum is normal in appearance without lesions or masses, perineum is normal on inspection. Lymphatic: No lymphadenopathy Neurologic: Grossly intact, no focal deficits Psychiatric: Normal mood and affect  Laboratory Data:  No results for input(s): WBC, HGB, HCT, PLT in the last 72 hours.  No results for input(s): NA, K, CL, GLUCOSE, BUN, CALCIUM, CREATININE in the last 72 hours.  Invalid input(s): CO3   No results found for this or any previous visit (from the past 24 hour(s)). Recent Results (from the past 240 hour(s))  SARS CORONAVIRUS 2 (TAT 6-24 HRS) Nasopharyngeal Nasopharyngeal Swab     Status: None   Collection Time: 07/11/19  2:59 PM   Specimen: Nasopharyngeal Swab  Result Value Ref Range Status   SARS Coronavirus 2 NEGATIVE NEGATIVE Final    Comment: (NOTE) SARS-CoV-2 target nucleic acids are NOT DETECTED. The SARS-CoV-2 RNA is generally detectable in upper and lower respiratory specimens during the acute phase of infection. Negative results do not preclude SARS-CoV-2 infection, do not rule out co-infections with other pathogens, and should not be used as the sole basis for treatment or other patient management decisions. Negative results must be combined with clinical observations, patient history, and epidemiological information. The expected result is Negative. Fact Sheet for Patients: SugarRoll.be Fact Sheet for Healthcare Providers: https://www.woods-mathews.com/ This test is not yet approved or cleared by the [place] FDA and  has been authorized for detection and/or diagnosis of SARS-CoV-2 by FDA under an Emergency Use Authorization (EUA). This EUA will remain  in effect (meaning this test can be used) for the duration of the COVID-19 declaration under Section 56 4(b)(1) of the Act, 21 U.S.C. section 360bbb-3(b)(1), unless the authorization is  terminated or revoked sooner. Performed at [facility], [place] [address] [place], [place] [address]     Renal Function: No results for input(s): CREATININE in the last 168 hours. CrCl cannot be calculated (Patient's most recent lab result is older than the maximum 21 days allowed.).  Radiologic Imaging: No results found.  Impression/Assessment:  Urothelial carcinoma of the bladder  Plan:  Cystoscopy, retrograde pyelograms bilaterally, transurethral resection of bladder tumor, placement of intravesical gemcitabine

## 2019-07-15 ENCOUNTER — Emergency Department (HOSPITAL_BASED_OUTPATIENT_CLINIC_OR_DEPARTMENT_OTHER)
Admission: EM | Admit: 2019-07-15 | Discharge: 2019-07-15 | Disposition: A | Discharge status: Home / Self Care | Payer: Medicare Other | Attending: Emergency Medicine | Admitting: Emergency Medicine

## 2019-07-15 ENCOUNTER — Other Ambulatory Visit: Payer: Self-pay

## 2019-07-15 DIAGNOSIS — I5032 Chronic diastolic (congestive) heart failure: Secondary | ICD-10-CM | POA: Insufficient documentation

## 2019-07-15 DIAGNOSIS — I130 Hypertensive heart and chronic kidney disease with heart failure and stage 1 through stage 4 chronic kidney disease, or unspecified chronic kidney disease: Secondary | ICD-10-CM | POA: Insufficient documentation

## 2019-07-15 DIAGNOSIS — R339 Retention of urine, unspecified: Secondary | ICD-10-CM | POA: Diagnosis not present

## 2019-07-15 DIAGNOSIS — Z87891 Personal history of nicotine dependence: Secondary | ICD-10-CM | POA: Diagnosis not present

## 2019-07-15 DIAGNOSIS — Y828 Other medical devices associated with adverse incidents: Secondary | ICD-10-CM | POA: Diagnosis not present

## 2019-07-15 DIAGNOSIS — E1122 Type 2 diabetes mellitus with diabetic chronic kidney disease: Secondary | ICD-10-CM | POA: Insufficient documentation

## 2019-07-15 DIAGNOSIS — T83091A Other mechanical complication of indwelling urethral catheter, initial encounter: Secondary | ICD-10-CM

## 2019-07-15 DIAGNOSIS — N183 Chronic kidney disease, stage 3 unspecified: Secondary | ICD-10-CM | POA: Diagnosis not present

## 2019-07-15 DIAGNOSIS — Z79899 Other long term (current) drug therapy: Secondary | ICD-10-CM | POA: Insufficient documentation

## 2019-07-15 DIAGNOSIS — Z436 Encounter for attention to other artificial openings of urinary tract: Secondary | ICD-10-CM | POA: Diagnosis present

## 2019-07-15 DIAGNOSIS — T83098A Other mechanical complication of other indwelling urethral catheter, initial encounter: Secondary | ICD-10-CM | POA: Diagnosis not present

## 2019-07-15 NOTE — ED Notes (Signed)
Foley irrigated with 100 ml NS with no resistance. Patient denies any pain.

## 2019-07-15 NOTE — ED Triage Notes (Signed)
He had polys on his bladder removed yesterday. He was sent home with a foley catheter. Here to see if the catheter is clogged.

## 2019-07-15 NOTE — Discharge Instructions (Signed)
Follow up with your urologist as planned

## 2019-07-15 NOTE — ED Notes (Signed)
Had 3 scans of 0 mls.

## 2019-07-15 NOTE — ED Provider Notes (Signed)
[place] EMERGENCY DEPARTMENT Provider Note   CSN: [id number] Arrival date & time: 07/15/19  1914     History Chief Complaint  Patient presents with  . Foley Catheter Problem    [name] is a 86 y.o. male.  HPI   Patient presented to the emergency room for evaluation of Foley catheter obstruction.  Patient had polyps removed from his bladder yesterday.  He had a Foley catheter placed after the procedure.  Patient states that the catheter was functioning properly yesterday.  He had urine to drain from the catheter this morning.  However is the day progressed he did not notice any more drainage.  He started to develop abdominal discomfort.  He spoke to his urologist who suggested trying to manipulate the catheter slightly.  He did that before he came to the emergency room.  While waiting to be seen the patient noticed the catheter started to drain and his pain resolved.  Patient is now feeling significantly better and there is a large amount of urine output in his Foley catheter.  Past Medical History:  Diagnosis Date  . A-fib ([place])   . Basal cell carcinoma    skin  . Bigeminal rhythm   . Bradycardia 06/21/2017  . Cataract   . Chronic renal insufficiency, stage 3 (moderate) 2018   GFR 30s-40s  . Coronary artery disease    post bypass  . CVD (cardiovascular disease)   . Diabetes mellitus without complication ([place])    type 2 diet controlled  . Diverticular disease   . Easy bruising   . 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 Gout   . Hernia   . Hyperlipidemia   . Hypertension   . IBS (irritable bowel syndrome)   . Iron deficiency anemia 02/04/2016  . Macular degeneration   . Microscopic colitis   . PVD (peripheral vascular disease) [facility])     Patient Active Problem List   Diagnosis Date Noted  . Hematuria 06/30/2019  . Irritable bowel syndrome with diarrhea 11/04/2017  . Mobitz (type) I (Wenckebach's) atrioventricular block 06/21/2017  .  Coronary artery disease of bypass graft of native heart with stable angina pectoris ([place])   . Other hyperlipidemia   . Hyperkalemia 05/06/2017  . Chronic diastolic CHF (congestive heart failure), NYHA class 1 ([place]) 03/24/2017  . Iron deficiency anemia 02/04/2016  . 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 Vitamin D deficiency 12/27/2015  . Anemia 12/27/2015  . Bence Jones proteinuria 12/27/2015  . Chronic kidney disease (CKD), stage III (moderate) 12/19/2015  . Macular degeneration   . Gout   . A-fib ([place]) 01/03/2013  . Essential hypertension 08/02/2008  . BIGEMINY 08/02/2008  . Prediabetes 08/02/2008    Past Surgical History:  Procedure Laterality Date  . CARDIAC CATHETERIZATION  2006  . CARDIOVERSION N/A 02/22/2013   Procedure: CARDIOVERSION;  Surgeon: [name], MD;  Location: [facility] ENDOSCOPY;  Service: Cardiovascular;  Laterality: N/A;  . CARDIOVERSION N/A 11/15/2014   Procedure: CARDIOVERSION;  Surgeon: [name], MD;  Location: [facility] ENDOSCOPY;  Service: Cardiovascular;  Laterality: N/A;  . CARDIOVERSION N/A 04/01/2017   Procedure: CARDIOVERSION;  Surgeon: Larey Dresser, MD;  Location: [place];  Service: Cardiovascular;  Laterality: N/A;  . CAROTID ENDARTERECTOMY  2009/ 1993   left/ right  . COLONOSCOPY  03/17/2005   The colon is normal.  . CORONARY ARTERY BYPASS GRAFT  1999  . CYSTOSCOPY W/ RETROGRADES Bilateral 07/14/2019   Procedure: CYSTOSCOPY WITH RETROGRADE PYELOGRAM;  Surgeon: Diona Fanti,  Annie Main, MD;  Location: [facility];  Service: Urology;  Laterality: Bilateral;  . ESOPHAGOGASTRODUODENOSCOPY  03/17/2005   Normal esophagus. Normal Stomanch. Normal duodenum.   Marland Kitchen EYE SURGERY     eyelid repair  . INGUINAL HERNIA REPAIR  02/11/2012   Procedure: LAPAROSCOPIC BILATERAL INGUINAL HERNIA REPAIR;  Surgeon: Pedro Earls, MD;  Location: [facility] ORS;  Service: General;  Laterality: Bilateral;  . PACEMAKER IMPLANT N/A 07/28/2017   [employer] Jude Medical  Assurity MRI conditional  dual-chamber pacemaker for symptomatic second degree AV block by Dr Rayann Heman  . SKIN CANCER EXCISION     right ear x 3  . TRANSURETHRAL RESECTION OF BLADDER TUMOR WITH MITOMYCIN-C N/A 07/14/2019   Procedure: TRANSURETHRAL RESECTION OF BLADDER TUMOR WITH GEMCITABINE IN PACU;  Surgeon: Franchot Gallo, MD;  Location: [facility];  Service: Urology;  Laterality: N/A;       Family History  Problem Relation Age of Onset  . Stroke Mother   . Coronary artery disease Father   . Heart disease Father   . Melanoma Sister   . Colon cancer Neg Hx     Social History   Tobacco Use  . Smoking status: Former Smoker    Quit date: 12/25/1961    Years since quitting: 57.5  . Smokeless tobacco: Former Systems developer    Types: Chew    Quit date: 02/03/1969  Substance Use Topics  . Alcohol use: No    Alcohol/week: 0.0 standard drinks  . Drug use: No    Home Medications Prior to Admission medications   Medication Sig Start Date End Date Taking? Authorizing Provider  ACCU-CHEK FASTCLIX LANCETS MISC Check fasting blood sugar once daily 05/06/18   Kuneff, Renee A, DO  allopurinol (ZYLOPRIM) 300 MG tablet Take 0.5 tablets (150 mg total) by mouth daily. 06/28/19   Kuneff, Renee A, DO  amLODipine (NORVASC) 5 MG tablet Take 1 tablet (5 mg total) by mouth daily. 09/08/18   [name], MD  atorvastatin (LIPITOR) 20 MG tablet Take 1 tablet (20 mg total) by mouth at bedtime. 11/11/18   Kuneff, Renee A, DO  blood glucose meter kit and supplies Dispense based on patient and insurance preference. Use up to four times daily as directed. R73.03 06/28/19   Kuneff, Renee A, DO  carvedilol (COREG) 6.25 MG tablet Take 1 tablet (6.25 mg total) by mouth 2 (two) times daily with a meal. 06/28/19   Kuneff, Renee A, DO  Cholecalciferol 125 MCG (5000 UT) capsule Take 5,000 Units by mouth daily.    [provider]  CHROMIUM GTF PO Take 1 capsule by mouth every evening.     [provider]  Cinnamon 500 MG TABS Take 2 tablets by mouth at bedtime.     [provider]  Coenzyme Q10 (CO Q 10) 100 MG CAPS Take 100 mg by mouth daily.     [provider]  dicyclomine (BENTYL) 10 MG capsule Take 1 capsule (10 mg total) by mouth 4 (four) times daily -  before meals and at bedtime. 11/03/17   Kuneff, Renee A, DO  diphenoxylate-atropine (LOMOTIL) 2.5-0.025 MG tablet 1 tablet. Pt takes PRN 08/23/18   [provider]  finasteride (PROSCAR) 5 MG tablet Take 5 mg by mouth daily. 08/24/17   [provider]  fish oil-omega-3 fatty acids 1000 MG capsule Take 1 g by mouth daily.     [provider]  furosemide (LASIX) 20 MG tablet Take 1 tablet (20 mg  total) by mouth as directed. Patient taking differently: Take 20 mg by mouth as needed.  05/13/18 08/11/18  Kuneff, Renee A, DO  glucose blood (ACCU-CHEK GUIDE) test strip Check fasting blood sugar once daily 05/12/19   Kuneff, Renee A, DO  latanoprost (XALATAN) 0.005 % ophthalmic solution Place 1 drop into both eyes at bedtime. 07/14/18   [provider]  Multiple Vitamins-Minerals (PRESERVISION AREDS PO) Take 1 tablet by mouth 2 (two) times daily.     [provider]  potassium chloride (KLOR-CON) 10 MEQ tablet TAKE 1 TABLET BY MOUTH EVERY DAY 04/15/19   Kuneff, Renee A, DO  Probiotic Product (PROBIOTIC PO) Take 1 tablet by mouth daily.    [provider]  tamsulosin (FLOMAX) 0.4 MG CAPS capsule TAKE 1 CAPSULE BY MOUTH ONCE DAILY AFTER SUPPER Patient taking differently: TAKE 2 CAPSULES BY MOUTH ONCE DAILY AFTER SUPPER 03/23/17   Kuneff, Renee A, DO  timolol (TIMOPTIC) 0.5 % ophthalmic solution Place 1 drop into both eyes 2 (two) times daily.  10/06/18   [provider]  VANADYL SULFATE PO Take 1 capsule by mouth every evening.     [provider]  zinc gluconate 50 MG tablet Take 50 mg by mouth daily.     [provider]    Allergies      Penicillins and Vancomycin  Review of Systems   Review of Systems  All other systems reviewed and are negative.   Physical Exam Updated Vital Signs BP (!) 151/61 (BP Location: Right Arm)   Pulse 70   Temp 97.8 F (36.6 C) (Oral)   Resp 14   Ht 1.727 m ('5\' 8"' )   Wt 68.9 kg   SpO2 96%   BMI 23.11 kg/m   Physical Exam Vitals and nursing note reviewed.  Constitutional:      General: He is not in acute distress.    Appearance: He is well-developed.  HENT:     Head: Normocephalic and atraumatic.     Right Ear: External ear normal.     Left Ear: External ear normal.  Eyes:     General: No scleral icterus.       Right eye: No discharge.        Left eye: No discharge.     Conjunctiva/sclera: Conjunctivae normal.  Neck:     Trachea: No tracheal deviation.  Cardiovascular:     Rate and Rhythm: Normal rate and regular rhythm.  Pulmonary:     Effort: Pulmonary effort is normal. No respiratory distress.     Breath sounds: Normal breath sounds. No stridor. No wheezing or rales.  Abdominal:     General: Bowel sounds are normal. There is no distension.     Palpations: Abdomen is soft.     Tenderness: There is no abdominal tenderness. There is no guarding or rebound.  Genitourinary:    Comments: Bloody appearing urine in the catheter bag Musculoskeletal:        General: No tenderness.     Cervical back: Neck supple.  Skin:    General: Skin is warm and dry.     Findings: No rash.  Neurological:     Mental Status: He is alert.     Cranial Nerves: No cranial nerve deficit (no facial droop, extraocular movements intact, no slurred speech).     Sensory: No sensory deficit.     Motor: No abnormal muscle tone or seizure activity.     Coordination: Coordination normal.     ED  Results / Procedures / Treatments   Labs (all labs ordered are listed, but only abnormal results are displayed) Labs Reviewed - No data to display  EKG None  Radiology No results  found.  Procedures Procedures (including critical care time)  Medications Ordered in ED Medications - No data to display  ED Course  I have reviewed the triage vital signs and the nursing notes.  Pertinent labs & imaging results that were available during my care of the patient were reviewed by me and considered in my medical decision making (see chart for details).    MDM Rules/Calculators/A&P                      Patient's catheter is now draining properly.  I suspect he had a small clot obstructing his Foley catheter.  We will go ahead and irrigate his catheter but otherwise he appears well for discharge at this time. Final Clinical Impression(s) / ED Diagnoses Final diagnoses:  Obstruction of Foley catheter, initial encounter [facility])    Rx / [place] Orders ED Discharge Orders    None       Dorie Rank, MD 07/15/19 2203

## 2019-07-17 NOTE — Anesthesia Postprocedure Evaluation (Signed)
Anesthesia Post Note  Patient: [name]  Procedure(s) Performed: TRANSURETHRAL RESECTION OF BLADDER TUMOR WITH GEMCITABINE IN PACU (N/A Bladder) CYSTOSCOPY WITH RETROGRADE PYELOGRAM (Bilateral Renal)     Patient location during evaluation: PACU Anesthesia Type: General Level of consciousness: awake and alert Pain management: pain level controlled Vital Signs Assessment: post-procedure vital signs reviewed and stable Respiratory status: spontaneous breathing, nonlabored ventilation, respiratory function stable and patient connected to nasal cannula oxygen Cardiovascular status: blood pressure returned to baseline and stable Postop Assessment: no apparent nausea or vomiting Anesthetic complications: no    Last Vitals:  Vitals:   07/14/19 1645 07/14/19 1732  BP: (!) 159/69 (!) 156/70  Pulse: 70 70  Resp: 17 18  Temp:    SpO2: 98% 99%    Last Pain:  Vitals:   07/15/19 1345  TempSrc:   PainSc: 5    Pain Goal: Patients Stated Pain Goal: 5 (07/14/19 1258)                 [name]

## 2019-07-18 ENCOUNTER — Other Ambulatory Visit: Payer: Self-pay | Admitting: Hematology & Oncology

## 2019-07-18 ENCOUNTER — Inpatient Hospital Stay: Payer: Medicare Other | Attending: Hematology & Oncology

## 2019-07-18 ENCOUNTER — Inpatient Hospital Stay (HOSPITAL_BASED_OUTPATIENT_CLINIC_OR_DEPARTMENT_OTHER): Payer: Medicare Other | Admitting: Hematology & Oncology

## 2019-07-18 ENCOUNTER — Encounter: Payer: Self-pay | Admitting: Hematology & Oncology

## 2019-07-18 ENCOUNTER — Telehealth: Payer: Self-pay | Admitting: *Deleted

## 2019-07-18 ENCOUNTER — Other Ambulatory Visit: Payer: Self-pay

## 2019-07-18 ENCOUNTER — Inpatient Hospital Stay: Payer: Medicare Other

## 2019-07-18 VITALS — BP 139/84 | HR 70 | Temp 97.8°F | Resp 18

## 2019-07-18 VITALS — BP 162/55 | HR 70 | Temp 97.7°F | Resp 19 | Wt 157.0 lb

## 2019-07-18 DIAGNOSIS — N189 Chronic kidney disease, unspecified: Secondary | ICD-10-CM | POA: Insufficient documentation

## 2019-07-18 DIAGNOSIS — D509 Iron deficiency anemia, unspecified: Secondary | ICD-10-CM

## 2019-07-18 DIAGNOSIS — D500 Iron deficiency anemia secondary to blood loss (chronic): Secondary | ICD-10-CM

## 2019-07-18 DIAGNOSIS — N1831 Chronic kidney disease, stage 3a: Secondary | ICD-10-CM

## 2019-07-18 DIAGNOSIS — I25708 Atherosclerosis of coronary artery bypass graft(s), unspecified, with other forms of angina pectoris: Secondary | ICD-10-CM

## 2019-07-18 DIAGNOSIS — D631 Anemia in chronic kidney disease: Secondary | ICD-10-CM | POA: Insufficient documentation

## 2019-07-18 DIAGNOSIS — C673 Malignant neoplasm of anterior wall of bladder: Secondary | ICD-10-CM

## 2019-07-18 DIAGNOSIS — D508 Other iron deficiency anemias: Secondary | ICD-10-CM

## 2019-07-18 HISTORY — DX: Malignant neoplasm of anterior wall of bladder: C67.3

## 2019-07-18 LAB — CMP (CANCER CENTER ONLY)
ALT: 20 U/L (ref 0–44)
AST: 20 U/L (ref 15–41)
Albumin: 3.7 g/dL (ref 3.5–5.0)
Alkaline Phosphatase: 67 U/L (ref 38–126)
Anion gap: 10 (ref 5–15)
BUN: 56 mg/dL — ABNORMAL HIGH (ref 8–23)
CO2: 21 mmol/L — ABNORMAL LOW (ref 22–32)
Calcium: 8.8 mg/dL — ABNORMAL LOW (ref 8.9–10.3)
Chloride: 102 mmol/L (ref 98–111)
Creatinine: 3.38 mg/dL (ref 0.61–1.24)
GFR, Est AFR Am: 18 mL/min — ABNORMAL LOW (ref >60)
GFR, Estimated: 15 mL/min — ABNORMAL LOW (ref >60)
Glucose, Bld: 145 mg/dL — ABNORMAL HIGH (ref 70–99)
Potassium: 4.6 mmol/L (ref 3.5–5.1)
Sodium: 133 mmol/L — ABNORMAL LOW (ref 135–145)
Total Bilirubin: 1.1 mg/dL (ref 0.3–1.2)
Total Protein: 6.7 g/dL (ref 6.5–8.1)

## 2019-07-18 LAB — CBC WITH DIFFERENTIAL (CANCER CENTER ONLY)
Abs Immature Granulocytes: 0.06 10*3/uL (ref 0.00–0.07)
Basophils Absolute: 0 10*3/uL (ref 0.0–0.1)
Basophils Relative: 0 %
Eosinophils Absolute: 0 10*3/uL (ref 0.0–0.5)
Eosinophils Relative: 0 %
HCT: 30.3 % — ABNORMAL LOW (ref 39.0–52.0)
Hemoglobin: 10.2 g/dL — ABNORMAL LOW (ref 13.0–17.0)
Immature Granulocytes: 1 %
Lymphocytes Relative: 10 %
Lymphs Abs: 1 10*3/uL (ref 0.7–4.0)
MCH: 30.4 pg (ref 26.0–34.0)
MCHC: 33.7 g/dL (ref 30.0–36.0)
MCV: 90.4 fL (ref 80.0–100.0)
Monocytes Absolute: 0.6 10*3/uL (ref 0.1–1.0)
Monocytes Relative: 5 %
Neutro Abs: 9 10*3/uL — ABNORMAL HIGH (ref 1.7–7.7)
Neutrophils Relative %: 84 %
Platelet Count: 145 10*3/uL — ABNORMAL LOW (ref 150–400)
RBC: 3.35 MIL/uL — ABNORMAL LOW (ref 4.22–5.81)
RDW: 15.3 % (ref 11.5–15.5)
WBC Count: 10.7 10*3/uL — ABNORMAL HIGH (ref 4.0–10.5)
nRBC: 0 % (ref 0.0–0.2)

## 2019-07-18 LAB — SURGICAL PATHOLOGY

## 2019-07-18 LAB — SAMPLE TO BLOOD BANK

## 2019-07-18 MED ORDER — DARBEPOETIN ALFA 300 MCG/0.6ML IJ SOSY
PREFILLED_SYRINGE | INTRAMUSCULAR | Status: AC
  Filled 2019-07-18: qty 0.6

## 2019-07-18 MED ORDER — SODIUM CHLORIDE 0.9 % IV SOLN
40.0000 mg | Freq: Once | INTRAVENOUS | Status: AC
  Administered 2019-07-18: 14:00:00 40 mg via INTRAVENOUS
  Filled 2019-07-18: qty 4

## 2019-07-18 MED ORDER — SODIUM CHLORIDE 0.9 % IV SOLN
20.0000 mg | Freq: Once | INTRAVENOUS | Status: AC
  Administered 2019-07-18: 20 mg via INTRAVENOUS
  Filled 2019-07-18: qty 20

## 2019-07-18 MED ORDER — DARBEPOETIN ALFA 300 MCG/0.6ML IJ SOSY
300.0000 ug | PREFILLED_SYRINGE | Freq: Once | INTRAMUSCULAR | Status: AC
  Administered 2019-07-18: 300 ug via SUBCUTANEOUS

## 2019-07-18 MED ORDER — SODIUM CHLORIDE 0.9 % IV SOLN
INTRAVENOUS | Status: DC
  Filled 2019-07-18 (×2): qty 250

## 2019-07-18 NOTE — Telephone Encounter (Signed)
Dr. Marin Olp notified of Creat-3.38.  Pt to receive IVF's today per order of Dr. Marin Olp.

## 2019-07-18 NOTE — Progress Notes (Signed)
Hematology and Oncology Follow Up Visit  BLASE BECKNER [id number] 10/16/1928 86 y.o. 07/18/2019   Principle Diagnosis:  Erythropoietin deficiency anemia Iron deficiency anemia  Current Therapy:   IV iron as indicated Aranesp 300 g subcutaneous as needed for hemoglobin less than 11 - last dose given on  10/07/2018   Interim History:  Mr. Reich is here today for in early visit.  I see he saw his wife today and she told me that he was not doing well.  It sounds like he now has superficial bladder cancer.  He had a biopsy done back on 07/14/2019.  The pathology report does show a high-grade minimally invasive bladder cancer.  He comes in with an indwelling Foley catheter.  Hopefully, this will be removed.  He is having some hematuria.  I suspect that this probably is from him being on blood thinner.  He has had what I believe is probably intravesicular BCG.  He had a tough time with this.  He is not eating much.  He appears dehydrated.  His BUN is 56 and creatinine is 3.3.  This is quite high for him.  He has had no fever.  He has had no cough or shortness of breath.  He does not have much of an appetite.  There is no nausea or vomiting.  He has had some constipation.  His hemoglobin today is 10.3.  This is low for him.  We will give him a dose of Aranesp.  .   Overall, his performance status is ECOG 1.  Medications:  Allergies as of 07/18/2019      Reactions   Penicillins Hives   Has patient had a PCN reaction causing immediate rash, facial/tongue/throat swelling, SOB or lightheadedness with hypotension: No Has patient had a PCN reaction causing severe rash involving mucus membranes or skin necrosis: Yes Has patient had a PCN reaction that required hospitalization: No Has patient had a PCN reaction occurring within the last 10 years: No If all of the above answers are "NO", then may proceed with Cephalosporin use.   Vancomycin Other (See Comments)   Edema and myalgia       Medication List       Accurate as of July 18, 2019  1:12 PM. If you have any questions, ask your nurse or doctor.        Accu-Chek FastClix Lancets Misc Check fasting blood sugar once daily   Accu-Chek Guide test strip Generic drug: glucose blood Check fasting blood sugar once daily   allopurinol 300 MG tablet Commonly known as: ZYLOPRIM Take 0.5 tablets (150 mg total) by mouth daily.   amLODipine 5 MG tablet Commonly known as: NORVASC Take 1 tablet (5 mg total) by mouth daily.   atorvastatin 20 MG tablet Commonly known as: LIPITOR Take 1 tablet (20 mg total) by mouth at bedtime.   blood glucose meter kit and supplies Dispense based on patient and insurance preference. Use up to four times daily as directed. R73.03   carvedilol 6.25 MG tablet Commonly known as: COREG Take 1 tablet (6.25 mg total) by mouth 2 (two) times daily with a meal.   Cholecalciferol 125 MCG (5000 UT) capsule Take 5,000 Units by mouth daily.   CHROMIUM GTF PO Take 1 capsule by mouth every evening.   Cinnamon 500 MG Tabs Take 2 tablets by mouth at bedtime.   Co Q 10 100 MG Caps Take 100 mg by mouth daily.   dicyclomine 10 MG capsule Commonly known as:  BENTYL Take 1 capsule (10 mg total) by mouth 4 (four) times daily -  before meals and at bedtime.   diphenoxylate-atropine 2.5-0.025 MG tablet Commonly known as: LOMOTIL 1 tablet. Pt takes PRN   finasteride 5 MG tablet Commonly known as: PROSCAR Take 5 mg by mouth daily.   fish oil-omega-3 fatty acids 1000 MG capsule Take 1 g by mouth daily.   furosemide 20 MG tablet Commonly known as: LASIX Take 1 tablet (20 mg total) by mouth as directed. What changed:   when to take this  reasons to take this   latanoprost 0.005 % ophthalmic solution Commonly known as: XALATAN Place 1 drop into both eyes at bedtime.   potassium chloride 10 MEQ tablet Commonly known as: KLOR-CON TAKE 1 TABLET BY MOUTH EVERY DAY   PRESERVISION AREDS PO  Take 1 tablet by mouth 2 (two) times daily.   PROBIOTIC PO Take 1 tablet by mouth daily.   tamsulosin 0.4 MG Caps capsule Commonly known as: FLOMAX TAKE 1 CAPSULE BY MOUTH ONCE DAILY AFTER SUPPER What changed: See the new instructions.   timolol 0.5 % ophthalmic solution Commonly known as: TIMOPTIC Place 1 drop into both eyes 2 (two) times daily.   traMADol 50 MG tablet Commonly known as: ULTRAM Take 50 mg by mouth 5 (five) times daily as needed.   VANADYL SULFATE PO Take 1 capsule by mouth every evening.   zinc gluconate 50 MG tablet Take 50 mg by mouth daily.       Allergies:  Allergies  Allergen Reactions  . Penicillins Hives    Has patient had a PCN reaction causing immediate rash, facial/tongue/throat swelling, SOB or lightheadedness with hypotension: No Has patient had a PCN reaction causing severe rash involving mucus membranes or skin necrosis: Yes Has patient had a PCN reaction that required hospitalization: No Has patient had a PCN reaction occurring within the last 10 years: No If all of the above answers are "NO", then may proceed with Cephalosporin use.   . Vancomycin Other (See Comments)    Edema and myalgia    Past Medical History, Surgical history, Social history, and Family History were reviewed and updated.  Review of Systems: Review of Systems  Constitutional: Negative.   HENT: Negative.   Eyes: Negative.   Respiratory: Negative.   Cardiovascular: Positive for palpitations.  Gastrointestinal: Negative.   Genitourinary: Negative.   Musculoskeletal: Negative.   Skin: Negative.   Neurological: Negative.   Endo/Heme/Allergies: Negative.   Psychiatric/Behavioral: Negative.      Physical Exam:  weight is 157 lb (71.2 kg). His temporal temperature is 97.7 F (36.5 C). His blood pressure is 162/55 (abnormal) and his pulse is 70. His respiration is 19 and oxygen saturation is 100%.   Wt Readings from Last 3 Encounters:  07/18/19 157 lb (71.2  kg)  07/15/19 152 lb (68.9 kg)  07/14/19 152 lb 3.2 oz (69 kg)    Physical Exam Vitals reviewed.  Constitutional:      Comments: This is a slightly ill-appearing white male.  He appears a little bit dehydrated.  He has a Foley catheter.  There is blood-tinged urine in the bag.  HENT:     Head: Normocephalic and atraumatic.  Eyes:     Pupils: Pupils are equal, round, and reactive to light.  Cardiovascular:     Rate and Rhythm: Normal rate and regular rhythm.     Heart sounds: Normal heart sounds.  Pulmonary:     Effort: Pulmonary effort is  normal.     Breath sounds: Normal breath sounds.  Abdominal:     General: Bowel sounds are normal.     Palpations: Abdomen is soft.  Musculoskeletal:        General: No tenderness or deformity. Normal range of motion.     Cervical back: Normal range of motion.  Lymphadenopathy:     Cervical: No cervical adenopathy.  Skin:    General: Skin is warm and dry.     Findings: No erythema or rash.  Neurological:     Mental Status: He is alert and oriented to person, place, and time.  Psychiatric:        Behavior: Behavior normal.        Thought Content: Thought content normal.        Judgment: Judgment normal.      Lab Results  Component Value Date   WBC 10.7 (H) 07/18/2019   HGB 10.2 (L) 07/18/2019   HCT 30.3 (L) 07/18/2019   MCV 90.4 07/18/2019   PLT 145 (L) 07/18/2019   Lab Results  Component Value Date   FERRITIN 259 05/05/2019   IRON 92 05/05/2019   TIBC 261 05/05/2019   UIBC 170 05/05/2019   IRONPCTSAT 35 05/05/2019   Lab Results  Component Value Date   RETICCTPCT 1.9 03/17/2019   RBC 3.35 (L) 07/18/2019   No results found for: KPAFRELGTCHN, LAMBDASER, KAPLAMBRATIO No results found for: Kandis Cocking, IGMSERUM Lab Results  Component Value Date   TOTALPROTELP 6.0 (L) 12/26/2015   ALBUMINELP 3.4 (L) 12/26/2015   A1GS 0.4 (H) 12/26/2015   A2GS 0.8 12/26/2015   BETS 0.4 12/26/2015   BETA2SER 0.3 12/26/2015   GAMS 0.8  12/26/2015   MSPIKE Not Observed 01/29/2016   SPEI SEE NOTE 12/26/2015     Chemistry      Component Value Date/Time   NA 141 07/14/2019 1308   NA 141 08/11/2017 0924   NA 145 04/09/2017 0905   NA 140 03/28/2016 0853   K 4.2 07/14/2019 1308   K 4.5 04/09/2017 0905   K 4.4 03/28/2016 0853   CL 110 07/14/2019 1308   CL 105 04/09/2017 0905   CO2 24 05/05/2019 0917   CO2 27 04/09/2017 0905   CO2 19 (L) 03/28/2016 0853   BUN 29 (H) 07/14/2019 1308   BUN 27 08/11/2017 0924   BUN 33 (H) 04/09/2017 0905   BUN 34.4 (H) 03/28/2016 0853   CREATININE 1.60 (H) 07/14/2019 1308   CREATININE 1.67 (H) 05/05/2019 0917   CREATININE 1.72 (H) 08/18/2017 1543   CREATININE 1.7 (H) 03/28/2016 0853   GLU 113 07/24/2016 0000      Component Value Date/Time   CALCIUM 9.2 05/05/2019 0917   CALCIUM 9.2 04/09/2017 0905   CALCIUM 9.2 03/28/2016 0853   ALKPHOS 54 05/05/2019 0917   ALKPHOS 69 04/09/2017 0905   ALKPHOS 85 03/28/2016 0853   AST 32 05/05/2019 0917   AST 23 03/28/2016 0853   ALT 38 05/05/2019 0917   ALT 27 04/09/2017 0905   ALT 37 03/28/2016 0853   BILITOT 0.6 05/05/2019 0917   BILITOT 0.79 03/28/2016 0853      Impression and Plan: Mr. Darko is a very pleasant 86 yo caucasian gentleman with both erythropoietin deficiency and iron deficiency anemia.   We will go ahead and give him some IV fluids today.  I will also give him some Pepcid and some Decadron.  Maybe the Decadron will help his appetite a little bit.  Again  he will get Aranesp.  We will have to see what his iron studies show.  I feel bad about the bladder cancer.  Hopefully, this will be successfully treated with resection and with intravesicular therapy.  I probably will have to see him back in a couple weeks or so just to follow-up with everything.   [name], MD 3/22/20211:12 PM

## 2019-07-18 NOTE — Patient Instructions (Signed)

## 2019-07-19 LAB — IRON AND TIBC
Iron: 23 ug/dL — ABNORMAL LOW (ref 42–163)
Saturation Ratios: 12 % — ABNORMAL LOW (ref 20–55)
TIBC: 200 ug/dL — ABNORMAL LOW (ref 202–409)
UIBC: 177 ug/dL (ref 117–376)

## 2019-07-19 LAB — FERRITIN: Ferritin: 983 ng/mL — ABNORMAL HIGH (ref 24–336)

## 2019-07-27 ENCOUNTER — Telehealth: Payer: Self-pay | Admitting: Hematology & Oncology

## 2019-07-27 ENCOUNTER — Other Ambulatory Visit: Payer: Self-pay

## 2019-07-27 ENCOUNTER — Inpatient Hospital Stay: Payer: Medicare Other

## 2019-07-27 ENCOUNTER — Encounter: Payer: Self-pay | Admitting: Hematology & Oncology

## 2019-07-27 ENCOUNTER — Ambulatory Visit: Payer: Medicare Other

## 2019-07-27 ENCOUNTER — Inpatient Hospital Stay (HOSPITAL_BASED_OUTPATIENT_CLINIC_OR_DEPARTMENT_OTHER): Payer: Medicare Other | Admitting: Hematology & Oncology

## 2019-07-27 VITALS — BP 156/63 | HR 73 | Temp 97.8°F | Resp 20 | Wt 151.9 lb

## 2019-07-27 DIAGNOSIS — I25708 Atherosclerosis of coronary artery bypass graft(s), unspecified, with other forms of angina pectoris: Secondary | ICD-10-CM

## 2019-07-27 DIAGNOSIS — D631 Anemia in chronic kidney disease: Secondary | ICD-10-CM

## 2019-07-27 DIAGNOSIS — D508 Other iron deficiency anemias: Secondary | ICD-10-CM

## 2019-07-27 DIAGNOSIS — D500 Iron deficiency anemia secondary to blood loss (chronic): Secondary | ICD-10-CM

## 2019-07-27 DIAGNOSIS — D509 Iron deficiency anemia, unspecified: Secondary | ICD-10-CM | POA: Diagnosis not present

## 2019-07-27 DIAGNOSIS — N1831 Chronic kidney disease, stage 3a: Secondary | ICD-10-CM

## 2019-07-27 DIAGNOSIS — N189 Chronic kidney disease, unspecified: Secondary | ICD-10-CM | POA: Diagnosis not present

## 2019-07-27 LAB — CMP (CANCER CENTER ONLY)
ALT: 48 U/L — ABNORMAL HIGH (ref 0–44)
AST: 27 U/L (ref 15–41)
Albumin: 3.7 g/dL (ref 3.5–5.0)
Alkaline Phosphatase: 68 U/L (ref 38–126)
Anion gap: 9 (ref 5–15)
BUN: 29 mg/dL — ABNORMAL HIGH (ref 8–23)
CO2: 21 mmol/L — ABNORMAL LOW (ref 22–32)
Calcium: 8.3 mg/dL — ABNORMAL LOW (ref 8.9–10.3)
Chloride: 111 mmol/L (ref 98–111)
Creatinine: 1.75 mg/dL — ABNORMAL HIGH (ref 0.61–1.24)
GFR, Est AFR Am: 39 mL/min — ABNORMAL LOW (ref >60)
GFR, Estimated: 34 mL/min — ABNORMAL LOW (ref >60)
Glucose, Bld: 149 mg/dL — ABNORMAL HIGH (ref 70–99)
Potassium: 3.9 mmol/L (ref 3.5–5.1)
Sodium: 141 mmol/L (ref 135–145)
Total Bilirubin: 0.9 mg/dL (ref 0.3–1.2)
Total Protein: 6.2 g/dL — ABNORMAL LOW (ref 6.5–8.1)

## 2019-07-27 LAB — RETICULOCYTES
Immature Retic Fract: 29.9 % — ABNORMAL HIGH (ref 2.3–15.9)
RBC.: 3.31 MIL/uL — ABNORMAL LOW (ref 4.22–5.81)
Retic Count, Absolute: 173.8 10*3/uL (ref 19.0–186.0)
Retic Ct Pct: 5.3 % — ABNORMAL HIGH (ref 0.4–3.1)

## 2019-07-27 LAB — CBC WITH DIFFERENTIAL (CANCER CENTER ONLY)
Abs Immature Granulocytes: 0.13 10*3/uL — ABNORMAL HIGH (ref 0.00–0.07)
Basophils Absolute: 0 10*3/uL (ref 0.0–0.1)
Basophils Relative: 0 %
Eosinophils Absolute: 0.1 10*3/uL (ref 0.0–0.5)
Eosinophils Relative: 1 %
HCT: 30.8 % — ABNORMAL LOW (ref 39.0–52.0)
Hemoglobin: 10.2 g/dL — ABNORMAL LOW (ref 13.0–17.0)
Immature Granulocytes: 2 %
Lymphocytes Relative: 16 %
Lymphs Abs: 1.2 10*3/uL (ref 0.7–4.0)
MCH: 30.4 pg (ref 26.0–34.0)
MCHC: 33.1 g/dL (ref 30.0–36.0)
MCV: 91.9 fL (ref 80.0–100.0)
Monocytes Absolute: 0.8 10*3/uL (ref 0.1–1.0)
Monocytes Relative: 10 %
Neutro Abs: 5.3 10*3/uL (ref 1.7–7.7)
Neutrophils Relative %: 71 %
Platelet Count: 164 10*3/uL (ref 150–400)
RBC: 3.35 MIL/uL — ABNORMAL LOW (ref 4.22–5.81)
RDW: 18.8 % — ABNORMAL HIGH (ref 11.5–15.5)
WBC Count: 7.6 10*3/uL (ref 4.0–10.5)
nRBC: 0 % (ref 0.0–0.2)

## 2019-07-27 LAB — IRON AND TIBC
Iron: 40 ug/dL — ABNORMAL LOW (ref 42–163)
Saturation Ratios: 17 % — ABNORMAL LOW (ref 20–55)
TIBC: 231 ug/dL (ref 202–409)
UIBC: 191 ug/dL (ref 117–376)

## 2019-07-27 LAB — FERRITIN: Ferritin: 367 ng/mL — ABNORMAL HIGH (ref 24–336)

## 2019-07-27 MED ORDER — SODIUM CHLORIDE 0.9 % IV SOLN
Freq: Once | INTRAVENOUS | Status: AC
  Filled 2019-07-27: qty 250

## 2019-07-27 MED ORDER — SODIUM CHLORIDE 0.9 % IV SOLN
510.0000 mg | Freq: Once | INTRAVENOUS | Status: AC
  Administered 2019-07-27: 11:00:00 510 mg via INTRAVENOUS
  Filled 2019-07-27: qty 510

## 2019-07-27 NOTE — Patient Instructions (Signed)

## 2019-07-27 NOTE — Progress Notes (Signed)
Hematology and Oncology Follow Up Visit  [name] [id number] [birth year] 86 y.o. 07/27/2019   Principle Diagnosis:  Erythropoietin deficiency anemia Iron deficiency anemia  Current Therapy:   IV iron as indicated -- feraheme given on 07/27/2019 Aranesp 300 g sq prn hemoglobin < 11 - last dose given on  07/18/2019   Interim History:  [name] is here today for his regular appointment.  We actually saw him early last week.  He was not doing all that well.  He was dehydrated.  He recently had a bladder procedure and had a Foley catheter that was then.  Thankfully the catheter is now been removed.  He does have bladder cancer.  This is a superficial bladder cancer.  The pathology report ([id number]) showed a high-grade urothelial carcinoma with invasion into the lamina propria.  It sounds like he had some type of intravesicular therapy.  He is feeling a whole lot better.  We did have to give a dose of Aranesp last week.  He will get iron today as his iron saturation is only 12%.  I do see him at church.  Hopefully he and his wife will be able to go to church on Easter Sunday.  He has had no problems with fever.  He has had no cough.  There is been no leg swelling.  His renal function is a whole lot better than it was last week.  Last week his creatinine was 3.38.  Now, it is 1.75.    His appetite is doing better.  There is no nausea or vomiting.    Overall, his performance status is ECOG 1.  Medications:  Allergies as of 07/27/2019      Reactions   Penicillins Hives   Has patient had a PCN reaction causing immediate rash, facial/tongue/throat swelling, SOB or lightheadedness with hypotension: No Has patient had a PCN reaction causing severe rash involving mucus membranes or skin necrosis: Yes Has patient had a PCN reaction that required hospitalization: No Has patient had a PCN reaction occurring within the last 10 years: No If all of the above answers are "NO",  then may proceed with Cephalosporin use.   Vancomycin Other (See Comments)   Edema and myalgia      Medication List       Accurate as of July 27, 2019 10:55 AM. If you have any questions, ask your nurse or doctor.        STOP taking these medications   traMADol 50 MG tablet Commonly known as: ULTRAM Stopped by: [name], MD     TAKE these medications   Accu-Chek FastClix Lancets Misc Check fasting blood sugar once daily   Accu-Chek Guide test strip Generic drug: glucose blood Check fasting blood sugar once daily   allopurinol 300 MG tablet Commonly known as: ZYLOPRIM Take 0.5 tablets (150 mg total) by mouth daily.   amLODipine 5 MG tablet Commonly known as: NORVASC Take 1 tablet (5 mg total) by mouth daily.   atorvastatin 20 MG tablet Commonly known as: LIPITOR Take 1 tablet (20 mg total) by mouth at bedtime.   blood glucose meter kit and supplies Dispense based on patient and insurance preference. Use up to four times daily as directed. R73.03   carvedilol 6.25 MG tablet Commonly known as: COREG Take 1 tablet (6.25 mg total) by mouth 2 (two) times daily with a meal.   Cholecalciferol 125 MCG (5000 UT) capsule Take 5,000 Units by mouth daily.   CHROMIUM GTF PO  Take 1 capsule by mouth every evening.   Cinnamon 500 MG Tabs Take 2 tablets by mouth at bedtime.   Co Q 10 100 MG Caps Take 100 mg by mouth daily.   dicyclomine 10 MG capsule Commonly known as: BENTYL Take 1 capsule (10 mg total) by mouth 4 (four) times daily -  before meals and at bedtime.   diphenoxylate-atropine 2.5-0.025 MG tablet Commonly known as: LOMOTIL 1 tablet. Pt takes PRN   finasteride 5 MG tablet Commonly known as: PROSCAR Take 5 mg by mouth daily.   fish oil-omega-3 fatty acids 1000 MG capsule Take 1 g by mouth daily.   furosemide 20 MG tablet Commonly known as: LASIX Take 1 tablet (20 mg total) by mouth as directed. What changed:   when to take this  reasons  to take this   latanoprost 0.005 % ophthalmic solution Commonly known as: XALATAN Place 1 drop into both eyes at bedtime.   potassium chloride 10 MEQ tablet Commonly known as: KLOR-CON TAKE 1 TABLET BY MOUTH EVERY DAY   PRESERVISION AREDS PO Take 1 tablet by mouth 2 (two) times daily.   PROBIOTIC PO Take 1 tablet by mouth daily.   tamsulosin 0.4 MG Caps capsule Commonly known as: FLOMAX TAKE 1 CAPSULE BY MOUTH ONCE DAILY AFTER SUPPER What changed: See the new instructions.   timolol 0.5 % ophthalmic solution Commonly known as: TIMOPTIC Place 1 drop into both eyes 2 (two) times daily.   VANADYL SULFATE PO Take 1 capsule by mouth every evening.   zinc gluconate 50 MG tablet Take 50 mg by mouth daily.       Allergies:  Allergies  Allergen Reactions  . Penicillins Hives    Has patient had a PCN reaction causing immediate rash, facial/tongue/throat swelling, SOB or lightheadedness with hypotension: No Has patient had a PCN reaction causing severe rash involving mucus membranes or skin necrosis: Yes Has patient had a PCN reaction that required hospitalization: No Has patient had a PCN reaction occurring within the last 10 years: No If all of the above answers are "NO", then may proceed with Cephalosporin use.   . Vancomycin Other (See Comments)    Edema and myalgia    Past Medical History, Surgical history, Social history, and Family History were reviewed and updated.  Review of Systems: Review of Systems  Constitutional: Negative.   HENT: Negative.   Eyes: Negative.   Respiratory: Negative.   Cardiovascular: Positive for palpitations.  Gastrointestinal: Negative.   Genitourinary: Negative.   Musculoskeletal: Negative.   Skin: Negative.   Neurological: Negative.   Endo/Heme/Allergies: Negative.   Psychiatric/Behavioral: Negative.      Physical Exam:  weight is 151 lb 14.4 oz (68.9 kg). His temporal temperature is 97.8 F (36.6 C). His blood pressure is  156/63 (abnormal) and his pulse is 73. His respiration is 20 and oxygen saturation is 100%.   Wt Readings from Last 3 Encounters:  07/27/19 151 lb 14.4 oz (68.9 kg)  07/18/19 157 lb (71.2 kg)  07/15/19 152 lb (68.9 kg)    Physical Exam Vitals reviewed.  HENT:     Head: Normocephalic and atraumatic.  Eyes:     Pupils: Pupils are equal, round, and reactive to light.  Cardiovascular:     Rate and Rhythm: Normal rate and regular rhythm.     Heart sounds: Normal heart sounds.  Pulmonary:     Effort: Pulmonary effort is normal.     Breath sounds: Normal breath sounds.  Abdominal:  General: Bowel sounds are normal.     Palpations: Abdomen is soft.  Musculoskeletal:        General: No tenderness or deformity. Normal range of motion.     Cervical back: Normal range of motion.  Lymphadenopathy:     Cervical: No cervical adenopathy.  Skin:    General: Skin is warm and dry.     Findings: No erythema or rash.  Neurological:     Mental Status: He is alert and oriented to person, place, and time.  Psychiatric:        Behavior: Behavior normal.        Thought Content: Thought content normal.        Judgment: Judgment normal.      Lab Results  Component Value Date   WBC 7.6 07/27/2019   HGB 10.2 (L) 07/27/2019   HCT 30.8 (L) 07/27/2019   MCV 91.9 07/27/2019   PLT 164 07/27/2019   Lab Results  Component Value Date   FERRITIN 983 (H) 07/18/2019   IRON 23 (L) 07/18/2019   TIBC 200 (L) 07/18/2019   UIBC 177 07/18/2019   IRONPCTSAT 12 (L) 07/18/2019   Lab Results  Component Value Date   RETICCTPCT 5.3 (H) 07/27/2019   RBC 3.31 (L) 07/27/2019   No results found for: KPAFRELGTCHN, LAMBDASER, KAPLAMBRATIO No results found for: Kandis Cocking, IGMSERUM Lab Results  Component Value Date   TOTALPROTELP 6.0 (L) 12/26/2015   ALBUMINELP 3.4 (L) 12/26/2015   A1GS 0.4 (H) 12/26/2015   A2GS 0.8 12/26/2015   BETS 0.4 12/26/2015   BETA2SER 0.3 12/26/2015   GAMS 0.8 12/26/2015    MSPIKE Not Observed 01/29/2016   SPEI SEE NOTE 12/26/2015     Chemistry      Component Value Date/Time   NA 141 07/27/2019 0956   NA 141 08/11/2017 0924   NA 145 04/09/2017 0905   NA 140 03/28/2016 0853   K 3.9 07/27/2019 0956   K 4.5 04/09/2017 0905   K 4.4 03/28/2016 0853   CL 111 07/27/2019 0956   CL 105 04/09/2017 0905   CO2 21 (L) 07/27/2019 0956   CO2 27 04/09/2017 0905   CO2 19 (L) 03/28/2016 0853   BUN 29 (H) 07/27/2019 0956   BUN 27 08/11/2017 0924   BUN 33 (H) 04/09/2017 0905   BUN 34.4 (H) 03/28/2016 0853   CREATININE 1.75 (H) 07/27/2019 0956   CREATININE 1.72 (H) 08/18/2017 1543   CREATININE 1.7 (H) 03/28/2016 0853   GLU 113 07/24/2016 0000      Component Value Date/Time   CALCIUM 8.3 (L) 07/27/2019 0956   CALCIUM 9.2 04/09/2017 0905   CALCIUM 9.2 03/28/2016 0853   ALKPHOS 68 07/27/2019 0956   ALKPHOS 69 04/09/2017 0905   ALKPHOS 85 03/28/2016 0853   AST 27 07/27/2019 0956   AST 23 03/28/2016 0853   ALT 48 (H) 07/27/2019 0956   ALT 27 04/09/2017 0905   ALT 37 03/28/2016 0853   BILITOT 0.9 07/27/2019 0956   BILITOT 0.79 03/28/2016 0853      Impression and Plan: Mr. Ferdig is a very pleasant 86 yo caucasian gentleman with both erythropoietin deficiency and iron deficiency anemia.   Of note, it [birth year] birthday on [birth year].  This is very exciting.  I am just happy that he is doing better than when we saw him last week.  He is definitely more active.  We will go ahead and give him IV iron today.  Hopefully, the bladder cancer  is not going to be a problem.  Given his age and the fact that this is a superficial bladder cancer, I think that his urologist will be somewhat conservative with invasive procedures and with therapy.  We will plan to get Avinash back in another 6 weeks.   [name], MD 3/31/202110:55 AM

## 2019-07-27 NOTE — Telephone Encounter (Signed)
Appointments scheduled calendar printed per 3/31 los

## 2019-07-29 ENCOUNTER — Telehealth: Payer: Self-pay

## 2019-08-01 ENCOUNTER — Other Ambulatory Visit: Payer: Self-pay

## 2019-08-01 ENCOUNTER — Telehealth (INDEPENDENT_AMBULATORY_CARE_PROVIDER_SITE_OTHER): Payer: Medicare Other | Admitting: Internal Medicine

## 2019-08-01 ENCOUNTER — Ambulatory Visit (INDEPENDENT_AMBULATORY_CARE_PROVIDER_SITE_OTHER): Payer: Medicare Other | Admitting: *Deleted

## 2019-08-01 ENCOUNTER — Encounter: Payer: Self-pay | Admitting: Internal Medicine

## 2019-08-01 VITALS — BP 147/57 | HR 70 | Ht 68.0 in | Wt 153.2 lb

## 2019-08-01 DIAGNOSIS — I442 Atrioventricular block, complete: Secondary | ICD-10-CM | POA: Diagnosis not present

## 2019-08-01 DIAGNOSIS — I4819 Other persistent atrial fibrillation: Secondary | ICD-10-CM | POA: Diagnosis not present

## 2019-08-01 DIAGNOSIS — I25708 Atherosclerosis of coronary artery bypass graft(s), unspecified, with other forms of angina pectoris: Secondary | ICD-10-CM | POA: Diagnosis not present

## 2019-08-01 DIAGNOSIS — I441 Atrioventricular block, second degree: Secondary | ICD-10-CM | POA: Diagnosis not present

## 2019-08-01 DIAGNOSIS — I484 Atypical atrial flutter: Secondary | ICD-10-CM

## 2019-08-01 DIAGNOSIS — D6869 Other thrombophilia: Secondary | ICD-10-CM | POA: Diagnosis not present

## 2019-08-01 LAB — CUP PACEART REMOTE DEVICE CHECK
Battery Remaining Longevity: 104 mo
Battery Remaining Percentage: 95.5 %
Battery Voltage: 2.99 V
Brady Statistic AP VP Percent: 35 %
Brady Statistic AP VS Percent: 1 %
Brady Statistic AS VP Percent: 62 %
Brady Statistic AS VS Percent: 1.9 %
Brady Statistic RA Percent Paced: 1.8 %
Brady Statistic RV Percent Paced: 97 %
Date Time Interrogation Session: 20210405020016
Implantable Lead Implant Date: 20190402
Implantable Lead Implant Date: 20190402
Implantable Lead Location: 753859
Implantable Lead Location: 753860
Implantable Pulse Generator Implant Date: 20190402
Lead Channel Impedance Value: 350 Ohm
Lead Channel Impedance Value: 460 Ohm
Lead Channel Pacing Threshold Amplitude: 0.5 V
Lead Channel Pacing Threshold Amplitude: 0.75 V
Lead Channel Pacing Threshold Pulse Width: 0.5 ms
Lead Channel Pacing Threshold Pulse Width: 0.5 ms
Lead Channel Sensing Intrinsic Amplitude: 12 mV
Lead Channel Sensing Intrinsic Amplitude: 2 mV
Lead Channel Setting Pacing Amplitude: 2 V
Lead Channel Setting Pacing Amplitude: 2.5 V
Lead Channel Setting Pacing Pulse Width: 0.5 ms
Lead Channel Setting Sensing Sensitivity: 2 mV
Pulse Gen Model: 2272
Pulse Gen Serial Number: 9003454

## 2019-08-01 NOTE — Progress Notes (Signed)
Electrophysiology TeleHealth Note  Due to national recommendations of social distancing due to [place] 19, an audio telehealth visit is felt to be most appropriate for this patient at this time.  Verbal consent was obtained by me for the telehealth visit today.  The patient does not have capability for a virtual visit.  A phone visit is therefore required today.   Date:  08/01/2019   ID:  [name], DOB 03-16-1929, MRN [id number]  Location: patient's home  Provider location:  [place]  Evaluation Performed: Follow-up visit  PCP:  [name], DO   Electrophysiologist:  Dr Rayann Heman  Chief Complaint:  palpitations  History of Present Illness:    [name] is a 86 y.o. male who presents via telehealth conferencing today.  Since last being seen in our clinic, the patient reports doing very well.  He has had some issues with hematuria. He has been found to have bladder polyp/ cancer which has been treated.  He been treated by Urology and is pleased with results. Today, he denies symptoms of palpitations, chest pain, shortness of breath,  lower extremity edema, dizziness, presyncope, or syncope.  The patient is otherwise without complaint today.  The patient denies symptoms of fevers, chills, cough, or new SOB worrisome for COVID 19.  Past Medical History:  Diagnosis Date  . A-fib ([place])   . Basal cell carcinoma    skin  . Bigeminal rhythm   . Bradycardia 06/21/2017  . Cancer of anterior wall of urinary bladder ([place]) 07/18/2019  . Cataract   . Chronic renal insufficiency, stage 3 (moderate) 2018   GFR 30s-40s  . Coronary artery disease    post bypass  . CVD (cardiovascular disease)   . Diabetes mellitus without complication ([place])    type 2 diet controlled  . Diverticular disease   . Easy bruising   . 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 Gout   . Hernia   . Hyperlipidemia   . Hypertension   . IBS (irritable bowel syndrome)   . Iron deficiency anemia  02/04/2016  . Macular degeneration   . Microscopic colitis   . PVD (peripheral vascular disease) ([place])     Past Surgical History:  Procedure Laterality Date  . CARDIAC CATHETERIZATION  2006  . CARDIOVERSION N/A 02/22/2013   Procedure: CARDIOVERSION;  Surgeon: [name], MD;  Location: [facility] ENDOSCOPY;  Service: Cardiovascular;  Laterality: N/A;  . CARDIOVERSION N/A 11/15/2014   Procedure: CARDIOVERSION;  Surgeon: [name], MD;  Location: [facility] ENDOSCOPY;  Service: Cardiovascular;  Laterality: N/A;  . CARDIOVERSION N/A 04/01/2017   Procedure: CARDIOVERSION;  Surgeon: Larey Dresser, MD;  Location: [facility];  Service: Cardiovascular;  Laterality: N/A;  . CAROTID ENDARTERECTOMY  2009/ 1993   left/ right  . COLONOSCOPY  03/17/2005   The colon is normal.  . CORONARY ARTERY BYPASS GRAFT  1999  . CYSTOSCOPY W/ RETROGRADES Bilateral 07/14/2019   Procedure: CYSTOSCOPY WITH RETROGRADE PYELOGRAM;  Surgeon: Franchot Gallo, MD;  Location: [facility];  Service: Urology;  Laterality: Bilateral;  . ESOPHAGOGASTRODUODENOSCOPY  03/17/2005   Normal esophagus. Normal Stomanch. Normal duodenum.   Marland Kitchen EYE SURGERY     eyelid repair  . INGUINAL HERNIA REPAIR  02/11/2012   Procedure: LAPAROSCOPIC BILATERAL INGUINAL HERNIA REPAIR;  Surgeon: Pedro Earls, MD;  Location: [facility];  Service: General;  Laterality: Bilateral;  . PACEMAKER IMPLANT N/A 07/28/2017   [employer] Jude Medical Assurity MRI conditional  dual-chamber  pacemaker for symptomatic second degree AV block by Dr Rayann Heman  . SKIN CANCER EXCISION     right ear x 3  . TRANSURETHRAL RESECTION OF BLADDER TUMOR WITH MITOMYCIN-C N/A 07/14/2019   Procedure: TRANSURETHRAL RESECTION OF BLADDER TUMOR WITH GEMCITABINE IN PACU;  Surgeon: Franchot Gallo, MD;  Location[facility];  Service: Urology;  Laterality: N/A;    Current Outpatient Medications  Medication Sig Dispense Refill  . ACCU-CHEK FASTCLIX LANCETS MISC Check  fasting blood sugar once daily 100 each 3  . allopurinol (ZYLOPRIM) 300 MG tablet Take 0.5 tablets (150 mg total) by mouth daily. 45 tablet 3  . amLODipine (NORVASC) 5 MG tablet Take 1 tablet (5 mg total) by mouth daily. 90 tablet 3  . atorvastatin (LIPITOR) 20 MG tablet Take 1 tablet (20 mg total) by mouth at bedtime. 90 tablet 3  . blood glucose meter kit and supplies Dispense based on patient and insurance preference. Use up to four times daily as directed. R73.03 1 each 0  . carvedilol (COREG) 6.25 MG tablet Take 1 tablet (6.25 mg total) by mouth 2 (two) times daily with a meal. 180 tablet 3  . Cholecalciferol 125 MCG (5000 UT) capsule Take 5,000 Units by mouth daily.    . CHROMIUM GTF PO Take 1 capsule by mouth every evening.     . Cinnamon 500 MG TABS Take 2 tablets by mouth at bedtime.     . Coenzyme Q10 (CO Q 10) 100 MG CAPS Take 100 mg by mouth daily.     Marland Kitchen dicyclomine (BENTYL) 10 MG capsule Take 1 capsule (10 mg total) by mouth 4 (four) times daily -  before meals and at bedtime. 360 capsule 3  . diphenoxylate-atropine (LOMOTIL) 2.5-0.025 MG tablet 1 tablet. Pt takes PRN    . finasteride (PROSCAR) 5 MG tablet Take 5 mg by mouth daily.  3  . fish oil-omega-3 fatty acids 1000 MG capsule Take 1 g by mouth daily.     Marland Kitchen glucose blood (ACCU-CHEK GUIDE) test strip Check fasting blood sugar once daily 100 each 0  . latanoprost (XALATAN) 0.005 % ophthalmic solution Place 1 drop into both eyes at bedtime.    . Multiple Vitamins-Minerals (PRESERVISION AREDS PO) Take 1 tablet by mouth 2 (two) times daily.     . potassium chloride (KLOR-CON) 10 MEQ tablet TAKE 1 TABLET BY MOUTH EVERY DAY 90 tablet 3  . Probiotic Product (PROBIOTIC PO) Take 1 tablet by mouth daily.    . tamsulosin (FLOMAX) 0.4 MG CAPS capsule TAKE 1 CAPSULE BY MOUTH ONCE DAILY AFTER SUPPER (Patient taking differently: TAKE 2 CAPSULES BY MOUTH ONCE DAILY AFTER SUPPER) 90 capsule 3  . timolol (TIMOPTIC) 0.5 % ophthalmic solution Place  1 drop into both eyes 2 (two) times daily.     Marland Kitchen VANADYL SULFATE PO Take 1 capsule by mouth every evening.     . zinc gluconate 50 MG tablet Take 50 mg by mouth daily.     . furosemide (LASIX) 20 MG tablet Take 1 tablet (20 mg total) by mouth as directed. (Patient taking differently: Take 20 mg by mouth as needed. ) 90 tablet 3   No current facility-administered medications for this visit.   Facility-Administered Medications Ordered in Other Visits  Medication Dose Route Frequency Provider Last Rate Last Admin  . gemcitabine (GEMZAR) chemo syringe for bladder instillation 2,000 mg  2,000 mg Bladder Instillation Once Franchot Gallo, MD        Allergies:  Penicillins and Vancomycin   Social History:  The patient  reports that he quit smoking about 57 years ago. He quit smokeless tobacco use about 50 years ago.  His smokeless tobacco use included chew. He reports that he does not drink alcohol or use drugs.   Family History:  The patient's family history includes Coronary artery disease in his father; Heart disease in his father; Melanoma in his sister; Stroke in his mother.   ROS:  Please see the history of present illness.   All other systems are personally reviewed and negative.    Exam:    Vital Signs:  BP (!) 147/57   Pulse 70   Ht '5\' 8"'  (1.727 m)   Wt 153 lb 3.2 oz (69.5 kg)   BMI 23.29 kg/m   Well sounding, alert and conversant   Labs/Other Tests and Data Reviewed:    Recent Labs: 07/27/2019: ALT 48; BUN 29; Creatinine 1.75; Hemoglobin 10.2; Platelet Count 164; Potassium 3.9; Sodium 141   Wt Readings from Last 3 Encounters:  08/01/19 153 lb 3.2 oz (69.5 kg)  07/27/19 151 lb 14.4 oz (68.9 kg)  07/18/19 157 lb (71.2 kg)     Last device remote is reviewed from [facility] PDF which reveals normal device function, in atrial flutter,  Burden since last visit is 100%   ASSESSMENT & PLAN:    1.  Persistent afib/ atypical atrial flutter afib burden by device is 100  % Doing well with rate control Recent hematuria due to bladder polyp/ cancer.  Followed by Urology. chads2vasc score is 5.  He is on eliquis Consider reprogramming VVIR on return if AF burden remains 100%  2. Complete heart block Normal pacemaker function Remotes are normal  3. HTN Stable No change required today  4. CAD No ischemic symptoms No changes   Follow-up:   with me in a year   Patient Risk:  after full review of this patients clinical status, I feel that they are at moderate risk at this time.  Today, I have spent 15 minutes with the patient with telehealth technology discussing arrhythmia management .    Army Fossa, MD  08/01/2019 2:34 PM     [address] [place] [id number] [phone] (office) [phone] (fax)

## 2019-08-02 NOTE — Progress Notes (Signed)
PPM Remote  

## 2019-08-03 ENCOUNTER — Inpatient Hospital Stay: Payer: Medicare Other

## 2019-08-03 DIAGNOSIS — C678 Malignant neoplasm of overlapping sites of bladder: Secondary | ICD-10-CM | POA: Diagnosis not present

## 2019-08-03 DIAGNOSIS — N400 Enlarged prostate without lower urinary tract symptoms: Secondary | ICD-10-CM | POA: Diagnosis not present

## 2019-08-04 ENCOUNTER — Telehealth: Payer: Self-pay | Admitting: Cardiovascular Disease

## 2019-08-04 ENCOUNTER — Other Ambulatory Visit: Payer: Self-pay | Admitting: Urology

## 2019-08-04 DIAGNOSIS — I4819 Other persistent atrial fibrillation: Secondary | ICD-10-CM

## 2019-08-04 MED ORDER — APIXABAN 2.5 MG PO TABS: 2.5000 mg | ORAL_TABLET | Freq: Two times a day (BID) | ORAL | 3 refills | Status: DC

## 2019-08-04 NOTE — Telephone Encounter (Signed)
New Message       [facility] Pre-operative Risk Assessment    Request for surgical clearance:  1. What type of surgery is being performed? Bladder Tumor Removal  2.    When is this surgery scheduled? 09/05/19 3.What type of clearance is required (medical clearance vs. Pharmacy clearance to hold med vs. Both)? Both  2. Are there any medications that need to be held prior to surgery and how long?Eliquis for 48 hours prior to surgery    3. Practice name and name of physician performing surgery? [facility], Dr. Franchot Gallo  4. What is your office phone number [phone]  Ext [phone]   7.   What is your office fax number [phone]  8.   Anesthesia type (None, local, MAC, general) ? General    [name] 08/04/2019, 11:07 AM  _________________________________________________________________   (provider comments below)

## 2019-08-04 NOTE — Addendum Note (Signed)
Addended by: Michae Kava on: 08/04/2019 02:42 PM   Modules accepted: Orders

## 2019-08-04 NOTE — Telephone Encounter (Signed)
Eliquis has been added back to the med list per Doreene Adas, PAC. Clearance request has been faxed to the requesting office. I will remove from the pre op call back pool.

## 2019-08-04 NOTE — Telephone Encounter (Signed)
   Primary Cardiologist: Jenkins Rouge, MD  Chart reviewed as part of pre-operative protocol coverage. Patient was contacted 08/04/2019 in reference to pre-operative risk assessment for pending surgery as outlined below.  Jayquan Bradsher Mckeag was last seen on 08/01/19 by Dr. Rayann Heman.  Since that day, [name] has done well. He can complete 4.0 METS without anginal symptoms.   Per our clinical pharmacist: Pt takes Eliquis for afib with CHADS2VASc score of 6 (age x2, CHF, HTN, DM, CAD). SCr 1.75, CrCl 58mL/min.  Eliquis 2.5mg  BID was removed from med list on 07/14/19 (went to ED with gross hematuria) with instructions to stop taking at discharge. Will need to clarify with pt if he has resumed this and update his med list if so.  Ok to hold Eliquis for 2 days prior to procedure  Therefore, based on ACC/AHA guidelines, the patient would be at moderate risk for the planned procedure without further cardiovascular testing.   I will route this recommendation to the requesting party via Epic fax function and remove from pre-op pool.  Please call with questions.  Tami Lin Duke, PA 08/04/2019, 2:04 PM

## 2019-08-04 NOTE — Telephone Encounter (Signed)
Pt takes Eliquis for afib with CHADS2VASc score of 6 (age x2, CHF, HTN, DM, CAD). SCr 1.75, CrCl 60mL/min.  Eliquis 2.5mg  BID was removed from med list on 07/14/19 (went to ED with gross hematuria) with instructions to stop taking at discharge. Will need to clarify with pt if he has resumed this and update his med list if so.  Ok to hold Eliquis for 2 days prior to procedure.

## 2019-08-04 NOTE — Telephone Encounter (Signed)
Can you please give recommendations on eliquis? Not sure why it isn't listed on his med list.

## 2019-08-08 ENCOUNTER — Ambulatory Visit: Payer: Medicare Other | Admitting: Hematology & Oncology

## 2019-08-08 ENCOUNTER — Ambulatory Visit: Payer: Medicare Other

## 2019-08-08 ENCOUNTER — Other Ambulatory Visit: Payer: Medicare Other

## 2019-08-26 ENCOUNTER — Other Ambulatory Visit: Payer: Self-pay

## 2019-08-26 ENCOUNTER — Encounter (HOSPITAL_BASED_OUTPATIENT_CLINIC_OR_DEPARTMENT_OTHER): Payer: Self-pay | Admitting: Urology

## 2019-08-26 NOTE — Progress Notes (Addendum)
Addendum: Spoke with [name] ok to proceed.  Pacemaker orders dr allred on chart, no rep needed per pacemaker orders on chart.  Spoke w/ via phone for pre-op interview---patient Lab needs dos----I stat 8               COVID test ------09-01-2019 at 255 pm Arrive at -------800 am 09-05-2019 NPO after ------midnight Medications to take morning of surgery ------carvedilol, amlodipine, allopurinol, finasteride, timolol eye drop Diabetic medication -----none Patient Special Instructions -----none Pre-Op special Istructions -----none Patient verbalized understanding of instructions that were given at this phone interview. Patient denies shortness of breath, chest pain, fever, cough a this phone interview.  Anesthesia : pacemaker, blood thinner, chart to William Billow zanetto pa for review  PCP: dr Howard Pouch Cardiologist :dr Jenkins Rouge lov 08-05-2018 epic Electrophysiology dr Thompson Grayer lov 08-01-2019 epic Last device check note 08-01-2019 Chest x-ray :none EKG :07-06-2019 epic Echo :04-03-2017 epic Cardiac Cath : 2006 (no results in epic) Sleep Study/ CPAP :n/a Fasting Blood Sugar :  115   / Checks Blood Sugar --in am diet controlled diabetic:   Blood Thinner/ Instructions /Last Dose: stop eliquis 2 days before surgery per angela duke pa 08-04-2019 epic ASA / Instructions/ Last Dose :   Patient denies shortness of breath, chest pain, fever, and cough at this phone interview.

## 2019-08-26 NOTE — Progress Notes (Signed)
PERIOPERATIVE PRESCRIPTION FOR IMPLANTED CARDIAC DEVICE PROGRAMMING  Patient Information: Name:  CHAKA JEFFERYS  DOB:  1928-08-21  MRN:  [id number]    Planned Procedure: transurethral resection of bladder tumor  Surgeon: dr Annie Main dahlstedt  Date of Procedure: 09-05-2019  Cautery will be used.  Position during surgery:   Dr Jeneen Rinks allred   Please send documentation back to:  [facility] (Fax # [phone])    Device Information:  Clinic EP Physician:  Thompson Grayer, MD   Device Type:  Pacemaker Manufacturer and Phone #:  St. Jude/[employer]: [phone] Pacemaker Dependent?:  Yes.   Date of Last Device Check:  08/01/2019 Normal Device Function?:  Yes.    Electrophysiologist's Recommendations:   Have magnet available.  Provide continuous ECG monitoring when magnet is used or reprogramming is to be performed.   Procedure should not interfere with device function.  No device programming or magnet placement needed.  Per Device Clinic Standing Orders, Simone Curia, RN  1:18 PM 08/26/2019

## 2019-08-30 DIAGNOSIS — L84 Corns and callosities: Secondary | ICD-10-CM | POA: Diagnosis not present

## 2019-08-30 DIAGNOSIS — I739 Peripheral vascular disease, unspecified: Secondary | ICD-10-CM | POA: Diagnosis not present

## 2019-08-30 DIAGNOSIS — E1151 Type 2 diabetes mellitus with diabetic peripheral angiopathy without gangrene: Secondary | ICD-10-CM | POA: Diagnosis not present

## 2019-08-30 DIAGNOSIS — L603 Nail dystrophy: Secondary | ICD-10-CM | POA: Diagnosis not present

## 2019-08-31 ENCOUNTER — Other Ambulatory Visit: Payer: Self-pay | Admitting: Cardiovascular Disease

## 2019-08-31 ENCOUNTER — Other Ambulatory Visit: Payer: Self-pay | Admitting: Internal Medicine

## 2019-08-31 DIAGNOSIS — I4819 Other persistent atrial fibrillation: Secondary | ICD-10-CM

## 2019-08-31 NOTE — Telephone Encounter (Signed)
Eliquis 2.5mg  refill request received. Patient is 86 years old, weight-69.4kg, Crea-1.75 on 06/29/2019, Diagnosis-Afib, and last seen by Dr. Rayann Heman via Telemedicine on 08/01/2019. Dose is appropriate based on dosing criteria. Will send in refill to requested pharmacy.

## 2019-09-01 ENCOUNTER — Other Ambulatory Visit (HOSPITAL_COMMUNITY)
Admission: RE | Admit: 2019-09-01 | Discharge: 2019-09-01 | Disposition: A | Discharge status: Home / Self Care | Payer: Medicare Other | Source: Ambulatory Visit | Attending: Urology | Admitting: Urology

## 2019-09-01 DIAGNOSIS — Z20822 Contact with and (suspected) exposure to covid-19: Secondary | ICD-10-CM | POA: Diagnosis not present

## 2019-09-01 DIAGNOSIS — Z01812 Encounter for preprocedural laboratory examination: Secondary | ICD-10-CM | POA: Diagnosis not present

## 2019-09-02 LAB — SARS CORONAVIRUS 2 (TAT 6-24 HRS): SARS Coronavirus 2: NEGATIVE

## 2019-09-04 NOTE — Anesthesia Preprocedure Evaluation (Addendum)
Anesthesia Evaluation  Patient identified by MRN, date of birth, ID band Patient awake    Reviewed: Allergy & Precautions, NPO status , Patient's Chart, lab work & pertinent test results  Airway Mallampati: II  TM Distance: >3 FB Neck ROM: Full    Dental no notable dental hx. (+) Teeth Intact, Implants, Dental Advisory Given   Pulmonary neg pulmonary ROS, former smoker,    Pulmonary exam normal breath sounds clear to auscultation       Cardiovascular hypertension, Pt. on home beta blockers and Pt. on medications + CAD  Normal cardiovascular exam+ dysrhythmias Atrial Fibrillation + Cardiac Defibrillator  Rhythm:Regular Rate:Normal  Echo 04/03/17 EF 55-60%   Neuro/Psych negative neurological ROS  negative psych ROS   GI/Hepatic negative GI ROS, Neg liver ROS,   Endo/Other  diabetes  Renal/GU Renal InsufficiencyRenal disease     Musculoskeletal negative musculoskeletal ROS (+)   Abdominal   Peds  Hematology   Anesthesia Other Findings All PCN Vnacomycin  Reproductive/Obstetrics negative OB ROS                            Anesthesia Physical Anesthesia Plan  ASA: III  Anesthesia Plan: General   Post-op Pain Management:    Induction:   PONV Risk Score and Plan: Treatment may vary due to age or medical condition, Ondansetron and Dexamethasone  Airway Management Planned: LMA  Additional Equipment: None  Intra-op Plan:   Post-operative Plan:   Informed Consent: I have reviewed the patients History and Physical, chart, labs and discussed the procedure including the risks, benefits and alternatives for the proposed anesthesia with the patient or authorized representative who has indicated his/her understanding and acceptance.     Dental advisory given  Plan Discussed with:   Anesthesia Plan Comments: (86 y/o M w Afib AICD s/p eliquis, DM, forTURBT)        Anesthesia Quick  Evaluation

## 2019-09-05 ENCOUNTER — Encounter (HOSPITAL_BASED_OUTPATIENT_CLINIC_OR_DEPARTMENT_OTHER)
Admission: RE | Disposition: A | Discharge status: Home / Self Care | Payer: Self-pay | Source: Home / Self Care | Attending: Urology

## 2019-09-05 ENCOUNTER — Ambulatory Visit (HOSPITAL_BASED_OUTPATIENT_CLINIC_OR_DEPARTMENT_OTHER): Payer: Medicare Other | Admitting: Physician Assistant

## 2019-09-05 ENCOUNTER — Ambulatory Visit (HOSPITAL_BASED_OUTPATIENT_CLINIC_OR_DEPARTMENT_OTHER)
Admission: RE | Admit: 2019-09-05 | Discharge: 2019-09-05 | Disposition: A | Discharge status: Home / Self Care | Payer: Medicare Other | Attending: Urology | Admitting: Urology

## 2019-09-05 ENCOUNTER — Other Ambulatory Visit: Payer: Self-pay

## 2019-09-05 ENCOUNTER — Encounter (HOSPITAL_BASED_OUTPATIENT_CLINIC_OR_DEPARTMENT_OTHER): Payer: Self-pay | Admitting: Urology

## 2019-09-05 DIAGNOSIS — Z951 Presence of aortocoronary bypass graft: Secondary | ICD-10-CM | POA: Diagnosis not present

## 2019-09-05 DIAGNOSIS — Z95810 Presence of automatic (implantable) cardiac defibrillator: Secondary | ICD-10-CM | POA: Insufficient documentation

## 2019-09-05 DIAGNOSIS — E7849 Other hyperlipidemia: Secondary | ICD-10-CM | POA: Diagnosis not present

## 2019-09-05 DIAGNOSIS — D631 Anemia in chronic kidney disease: Secondary | ICD-10-CM | POA: Insufficient documentation

## 2019-09-05 DIAGNOSIS — I4891 Unspecified atrial fibrillation: Secondary | ICD-10-CM | POA: Diagnosis not present

## 2019-09-05 DIAGNOSIS — E1122 Type 2 diabetes mellitus with diabetic chronic kidney disease: Secondary | ICD-10-CM | POA: Diagnosis not present

## 2019-09-05 DIAGNOSIS — I2510 Atherosclerotic heart disease of native coronary artery without angina pectoris: Secondary | ICD-10-CM | POA: Diagnosis not present

## 2019-09-05 DIAGNOSIS — I129 Hypertensive chronic kidney disease with stage 1 through stage 4 chronic kidney disease, or unspecified chronic kidney disease: Secondary | ICD-10-CM | POA: Diagnosis not present

## 2019-09-05 DIAGNOSIS — N183 Chronic kidney disease, stage 3 unspecified: Secondary | ICD-10-CM | POA: Insufficient documentation

## 2019-09-05 DIAGNOSIS — E1151 Type 2 diabetes mellitus with diabetic peripheral angiopathy without gangrene: Secondary | ICD-10-CM | POA: Diagnosis not present

## 2019-09-05 DIAGNOSIS — C673 Malignant neoplasm of anterior wall of bladder: Secondary | ICD-10-CM | POA: Diagnosis not present

## 2019-09-05 DIAGNOSIS — C679 Malignant neoplasm of bladder, unspecified: Secondary | ICD-10-CM | POA: Diagnosis not present

## 2019-09-05 DIAGNOSIS — N3289 Other specified disorders of bladder: Secondary | ICD-10-CM | POA: Insufficient documentation

## 2019-09-05 DIAGNOSIS — N3020 Other chronic cystitis without hematuria: Secondary | ICD-10-CM | POA: Diagnosis not present

## 2019-09-05 DIAGNOSIS — R7303 Prediabetes: Secondary | ICD-10-CM | POA: Diagnosis not present

## 2019-09-05 DIAGNOSIS — Z87891 Personal history of nicotine dependence: Secondary | ICD-10-CM | POA: Diagnosis not present

## 2019-09-05 DIAGNOSIS — Z85828 Personal history of other malignant neoplasm of skin: Secondary | ICD-10-CM | POA: Insufficient documentation

## 2019-09-05 DIAGNOSIS — C678 Malignant neoplasm of overlapping sites of bladder: Secondary | ICD-10-CM | POA: Diagnosis not present

## 2019-09-05 HISTORY — PX: TRANSURETHRAL RESECTION OF BLADDER TUMOR: SHX2575

## 2019-09-05 LAB — POCT I-STAT, CHEM 8
BUN: 27 mg/dL — ABNORMAL HIGH (ref 8–23)
Calcium, Ion: 1.28 mmol/L (ref 1.15–1.40)
Chloride: 108 mmol/L (ref 98–111)
Creatinine, Ser: 1.7 mg/dL — ABNORMAL HIGH (ref 0.61–1.24)
Glucose, Bld: 125 mg/dL — ABNORMAL HIGH (ref 70–99)
HCT: 35 % — ABNORMAL LOW (ref 39.0–52.0)
Hemoglobin: 11.9 g/dL — ABNORMAL LOW (ref 13.0–17.0)
Potassium: 4.5 mmol/L (ref 3.5–5.1)
Sodium: 142 mmol/L (ref 135–145)
TCO2: 25 mmol/L (ref 22–32)

## 2019-09-05 LAB — GLUCOSE, CAPILLARY: Glucose-Capillary: 118 mg/dL — ABNORMAL HIGH (ref 70–99)

## 2019-09-05 SURGERY — TURBT (TRANSURETHRAL RESECTION OF BLADDER TUMOR)
Anesthesia: General

## 2019-09-05 MED ORDER — ONDANSETRON HCL 4 MG/2ML IJ SOLN: 4.0000 mg | Freq: Once | INTRAMUSCULAR | Status: DC | PRN

## 2019-09-05 MED ORDER — CIPROFLOXACIN IN D5W 400 MG/200ML IV SOLN
INTRAVENOUS | Status: AC
  Filled 2019-09-05: qty 200

## 2019-09-05 MED ORDER — FENTANYL CITRATE (PF) 100 MCG/2ML IJ SOLN: 25.0000 ug | INTRAMUSCULAR | Status: DC | PRN

## 2019-09-05 MED ORDER — ONDANSETRON HCL 4 MG/2ML IJ SOLN
INTRAMUSCULAR | Status: AC
  Filled 2019-09-05: qty 2

## 2019-09-05 MED ORDER — ONDANSETRON HCL 4 MG/2ML IJ SOLN
INTRAMUSCULAR | Status: DC | PRN
  Administered 2019-09-05: 4 mg via INTRAVENOUS

## 2019-09-05 MED ORDER — PROPOFOL 10 MG/ML IV BOLUS
INTRAVENOUS | Status: DC | PRN
  Administered 2019-09-05: 60 mg via INTRAVENOUS

## 2019-09-05 MED ORDER — PHENYLEPHRINE 40 MCG/ML (10ML) SYRINGE FOR IV PUSH (FOR BLOOD PRESSURE SUPPORT)
PREFILLED_SYRINGE | INTRAVENOUS | Status: AC
  Filled 2019-09-05: qty 10

## 2019-09-05 MED ORDER — FENTANYL CITRATE (PF) 100 MCG/2ML IJ SOLN
INTRAMUSCULAR | Status: AC
  Filled 2019-09-05: qty 2

## 2019-09-05 MED ORDER — ACETAMINOPHEN 10 MG/ML IV SOLN: 1000.0000 mg | Freq: Once | INTRAVENOUS | Status: DC | PRN

## 2019-09-05 MED ORDER — LACTATED RINGERS IV SOLN: INTRAVENOUS | Status: DC

## 2019-09-05 MED ORDER — CIPROFLOXACIN IN D5W 400 MG/200ML IV SOLN
400.0000 mg | INTRAVENOUS | Status: AC
  Administered 2019-09-05: 09:00:00 400 mg via INTRAVENOUS

## 2019-09-05 MED ORDER — SODIUM CHLORIDE 0.9 % IR SOLN
Status: DC | PRN
  Administered 2019-09-05 (×2): 3000 mL

## 2019-09-05 MED ORDER — NITROFURANTOIN MONOHYD MACRO 100 MG PO CAPS
100.0000 mg | ORAL_CAPSULE | Freq: Two times a day (BID) | ORAL | 0 refills | Status: DC
Start: 2019-09-05 — End: 2019-11-24

## 2019-09-05 MED ORDER — LIDOCAINE 2% (20 MG/ML) 5 ML SYRINGE
INTRAMUSCULAR | Status: AC
  Filled 2019-09-05: qty 5

## 2019-09-05 MED ORDER — PROPOFOL 10 MG/ML IV BOLUS
INTRAVENOUS | Status: AC
  Filled 2019-09-05: qty 20

## 2019-09-05 MED ORDER — FENTANYL CITRATE (PF) 100 MCG/2ML IJ SOLN
INTRAMUSCULAR | Status: DC | PRN
  Administered 2019-09-05: 25 ug via INTRAVENOUS
  Administered 2019-09-05: 50 ug via INTRAVENOUS

## 2019-09-05 MED ORDER — PHENYLEPHRINE 40 MCG/ML (10ML) SYRINGE FOR IV PUSH (FOR BLOOD PRESSURE SUPPORT)
PREFILLED_SYRINGE | INTRAVENOUS | Status: DC | PRN
  Administered 2019-09-05 (×2): 80 ug via INTRAVENOUS
  Administered 2019-09-05: 40 ug via INTRAVENOUS

## 2019-09-05 MED ORDER — LIDOCAINE 2% (20 MG/ML) 5 ML SYRINGE
INTRAMUSCULAR | Status: DC | PRN
  Administered 2019-09-05: 60 mg via INTRAVENOUS

## 2019-09-05 SURGICAL SUPPLY — 20 items
BAG DRAIN URO-CYSTO SKYTR STRL (DRAIN) ×2 IMPLANT
BAG URINE LEG 500ML (DRAIN) ×1 IMPLANT
CATH FOLEY 2WAY SLVR  5CC 18FR (CATHETERS) ×2
CATH FOLEY 2WAY SLVR 5CC 18FR (CATHETERS) IMPLANT
CLOTH BEACON ORANGE TIMEOUT ST (SAFETY) ×2 IMPLANT
GLOVE BIO SURGEON STRL SZ 6.5 (GLOVE) ×2 IMPLANT
GLOVE BIO SURGEON STRL SZ8 (GLOVE) ×2 IMPLANT
GOWN STRL REUS W/TWL LRG LVL4 (GOWN DISPOSABLE) ×1 IMPLANT
GOWN STRL REUS W/TWL XL LVL3 (GOWN DISPOSABLE) ×2 IMPLANT
IV NS IRRIG 3000ML ARTHROMATIC (IV SOLUTION) ×3 IMPLANT
KIT TURNOVER CYSTO (KITS) ×2 IMPLANT
LOOP CUT BIPOLAR 24F LRG (ELECTROSURGICAL) ×2 IMPLANT
MANIFOLD NEPTUNE II (INSTRUMENTS) ×2 IMPLANT
NS IRRIG 500ML POUR BTL (IV SOLUTION) ×2 IMPLANT
SYR TOOMEY IRRIG 70ML (MISCELLANEOUS) ×2
SYRINGE TOOMEY IRRIG 70ML (MISCELLANEOUS) ×1 IMPLANT
TRAY CYSTO PACK (CUSTOM PROCEDURE TRAY) ×2 IMPLANT
TUBE CONNECTING 12X1/4 (SUCTIONS) ×2 IMPLANT
TUBING UROLOGY SET (TUBING) ×1 IMPLANT
WATER STERILE IRR 500ML POUR (IV SOLUTION) ×1 IMPLANT

## 2019-09-05 NOTE — Interval H&P Note (Signed)
History and Physical Interval Note:  09/05/2019 9:01 AM  [name]  has presented today for surgery, with the diagnosis of BLADDER CANCER.  The various methods of treatment have been discussed with the patient and family. After consideration of risks, benefits and other options for treatment, the patient has consented to  Procedure(s) with comments: TRANSURETHRAL RESECTION OF BLADDER TUMOR (TURBT) (N/A) - 30 MINS as a surgical intervention.  The patient's history has been reviewed, patient examined, no change in status, stable for surgery.  I have reviewed the patient's chart and labs.  Questions were answered to the patient's satisfaction.     Lillette Boxer Dylan Ruotolo

## 2019-09-05 NOTE — Anesthesia Postprocedure Evaluation (Signed)
Anesthesia Post Note  Patient: [name]  Procedure(s) Performed: TRANSURETHRAL RESECTION OF BLADDER TUMOR (TURBT) (N/A )     Patient location during evaluation: PACU Anesthesia Type: General Level of consciousness: awake and alert Pain management: pain level controlled Vital Signs Assessment: post-procedure vital signs reviewed and stable Respiratory status: spontaneous breathing, nonlabored ventilation, respiratory function stable and patient connected to nasal cannula oxygen Cardiovascular status: blood pressure returned to baseline and stable Postop Assessment: no apparent nausea or vomiting Anesthetic complications: no    Last Vitals:  Vitals:   09/05/19 1045 09/05/19 1120  BP: (!) 144/68 (!) 149/79  Pulse: 69 70  Resp: 11 12  Temp:  36.6 C  SpO2: 97% 99%    Last Pain:  Vitals:   09/05/19 1120  TempSrc:   PainSc: 0-No pain                 Barnet Glasgow

## 2019-09-05 NOTE — Discharge Instructions (Signed)
1. You may see some blood in the urine and may have some burning with urination for 48-72 hours. You also may notice that you have to urinate more frequently or urgently after your procedure which is normal.  2. You should call should you develop an inability urinate, fever > 101, persistent nausea and vomiting that prevents you from eating or drinking to stay hydrated.  3. If you have a catheter, you will be taught how to take care of the catheter by the nursing staff prior to discharge from the hospital.  You may periodically feel a strong urge to void with the catheter in place.  This is a bladder spasm and most often can occur when having a bowel movement or moving around. It is typically self-limited and usually will stop after a few minutes.  You may use some Vaseline or Neosporin around the tip of the catheter to reduce friction at the tip of the penis. You may also see some blood in the urine.  A very small amount of blood can make the urine look quite red.  As long as the catheter is draining well, there usually is not a problem.  However, if the catheter is not draining well and is bloody, you should call the office [phone]) to notify us.  If the urine is clear, it is okay to remove the catheter as instructed on Tuesday morning.        4.  It is okay to start back on Eliquis when your urine turns yellow.   Post Anesthesia Home Care Instructions  Activity: Get plenty of rest for the remainder of the day. A responsible individual must stay with you for 24 hours following the procedure.  For the next 24 hours, DO NOT: -Drive a car -Paediatric nurse -Drink alcoholic beverages -Take any medication unless instructed by your physician -Make any legal decisions or sign important papers.  Meals: Start with liquid foods such as gelatin or soup. Progress to regular foods as tolerated. Avoid greasy, spicy, heavy foods. If nausea and/or vomiting occur, drink only clear liquids until the nausea  and/or vomiting subsides. Call your physician if vomiting continues.  Special Instructions/Symptoms: Your throat may feel dry or sore from the anesthesia or the breathing tube placed in your throat during surgery. If this causes discomfort, gargle with warm salt water. The discomfort should disappear within 24 hours.  Call your surgeon if you experience:   1.  Fever over 101.0. 2.  Inability to urinate after catheter removed. 3.  Nausea and/or vomiting. 4.  Extreme swelling or bruising at the surgical site. 5.  Continued bleeding from the incision. 6.  Increased pain, redness or drainage from the incision. 7.  Problems related to your pain medication. 8.  Any problems and/or concerns

## 2019-09-05 NOTE — Transfer of Care (Signed)
Last Vitals:  Vitals Value Taken Time  BP 138/76 09/05/19 1002  Temp    Pulse 70 09/05/19 1005  Resp 15 09/05/19 1005  SpO2 99 % 09/05/19 1005  Vitals shown include unvalidated device data.  Last Pain:  Vitals:   09/05/19 0837  TempSrc: Oral  PainSc: 0-No pain      Patients Stated Pain Goal: 5 (09/05/19 7282)  Immediate Anesthesia Transfer of Care Note  Patient: [name]  Procedure(s) Performed: Procedure(s) (LRB): TRANSURETHRAL RESECTION OF BLADDER TUMOR (TURBT) (N/A)  Patient Location: PACU  Anesthesia Type: General  Level of Consciousness: awake, alert  and oriented  Airway & Oxygen Therapy: Patient Spontanous Breathing and Patient connected to nasal cannula oxygen  Post-op Assessment: Report given to PACU RN and Post -op Vital signs reviewed and stable  Post vital signs: Reviewed and stable  Complications: No apparent anesthesia complications

## 2019-09-05 NOTE — H&P (Signed)
H&P  Chief Complaint: Bladder cancer  History of Present Illness: 86 yo male presents for 2nd stage TURBT. Initial procedure performed 3.18.2021 for a large bladder base tumor. PAth--HGNMIBC. He presents for repeast resection.  Past Medical History:  Diagnosis Date  . A-fib ([place])   . Basal cell carcinoma    skin  . Bigeminal rhythm   . Bradycardia 06/21/2017  . Cancer of anterior wall of urinary bladder ([place]) 07/18/2019  . Cataract   . Chronic renal insufficiency, stage 3 (moderate) 2018   GFR 30s-40s  . Coronary artery disease    post bypass  . CVD (cardiovascular disease)   . Diabetes mellitus without complication ([place])    type 2 diet controlled  . Diverticular disease   . Easy bruising   . 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 Gout   . Hernia   . Hyperlipidemia   . Hypertension   . IBS (irritable bowel syndrome)   . Iron deficiency anemia 02/04/2016  . Macular degeneration   . Microscopic colitis   . PVD (peripheral vascular disease) ([place])     Past Surgical History:  Procedure Laterality Date  . CARDIAC CATHETERIZATION  2006  . CARDIOVERSION N/A 02/22/2013   Procedure: CARDIOVERSION;  Surgeon: [name], MD;  Location[facility] ENDOSCOPY;  Service: Cardiovascular;  Laterality: N/A;  . CARDIOVERSION N/A 11/15/2014   Procedure: CARDIOVERSION;  Surgeon: [name], MD;  Location: [facility] ENDOSCOPY;  Service: Cardiovascular;  Laterality: N/A;  . CARDIOVERSION N/A 04/01/2017   Procedure: CARDIOVERSION;  Surgeon: Larey Dresser, MD;  Location: [place];  Service: Cardiovascular;  Laterality: N/A;  . CAROTID ENDARTERECTOMY  2009/ 1993   left/ right  . COLONOSCOPY  03/17/2005   The colon is normal.  . CORONARY ARTERY BYPASS GRAFT  1999  . CYSTOSCOPY W/ RETROGRADES Bilateral 07/14/2019   Procedure: CYSTOSCOPY WITH RETROGRADE PYELOGRAM;  Surgeon: Franchot Gallo, MD;  Location: [facility];  Service: Urology;  Laterality: Bilateral;  .  ESOPHAGOGASTRODUODENOSCOPY  03/17/2005   Normal esophagus. Normal Stomanch. Normal duodenum.   Marland Kitchen EYE SURGERY     eyelid repair  . INGUINAL HERNIA REPAIR  02/11/2012   Procedure: LAPAROSCOPIC BILATERAL INGUINAL HERNIA REPAIR;  Surgeon: Pedro Earls, MD;  Location: [facility] ORS;  Service: General;  Laterality: Bilateral;  . PACEMAKER IMPLANT N/A 07/28/2017   [employer] Jude Medical Assurity MRI conditional  dual-chamber pacemaker for symptomatic second degree AV block by Dr Rayann Heman  . SKIN CANCER EXCISION     right ear x 3  . TRANSURETHRAL RESECTION OF BLADDER TUMOR WITH MITOMYCIN-C N/A 07/14/2019   Procedure: TRANSURETHRAL RESECTION OF BLADDER TUMOR WITH GEMCITABINE IN PACU;  Surgeon: Franchot Gallo, MD;  Location: [facility];  Service: Urology;  Laterality: N/A;    Home Medications:  Allergies as of 09/05/2019      Reactions   Penicillins Hives   Has patient had a PCN reaction causing immediate rash, facial/tongue/throat swelling, SOB or lightheadedness with hypotension: No Has patient had a PCN reaction causing severe rash involving mucus membranes or skin necrosis: Yes Has patient had a PCN reaction that required hospitalization: No Has patient had a PCN reaction occurring within the last 10 years: No If all of the above answers are "NO", then may proceed with Cephalosporin use.   Vancomycin Other (See Comments)   Edema and myalgia      Medication List    Notice   Cannot display discharge medications because the patient has not yet been  admitted.     Allergies:  Allergies  Allergen Reactions  . Penicillins Hives    Has patient had a PCN reaction causing immediate rash, facial/tongue/throat swelling, SOB or lightheadedness with hypotension: No Has patient had a PCN reaction causing severe rash involving mucus membranes or skin necrosis: Yes Has patient had a PCN reaction that required hospitalization: No Has patient had a PCN reaction occurring within the last 10 years:  No If all of the above answers are "NO", then may proceed with Cephalosporin use.   . Vancomycin Other (See Comments)    Edema and myalgia    Family History  Problem Relation Age of Onset  . Stroke Mother   . Coronary artery disease Father   . Heart disease Father   . Melanoma Sister   . Colon cancer Neg Hx     Social History:  reports that he quit smoking about 57 years ago. He quit smokeless tobacco use about 50 years ago.  His smokeless tobacco use included chew. He reports that he does not drink alcohol or use drugs.  ROS: A complete review of systems was performed.  All systems are negative except for pertinent findings as noted.  Physical Exam:  Vital signs in last 24 hours: Ht 5\' 8"  (1.727 m)   Wt 69.4 kg   BMI 23.26 kg/m  Constitutional:  Alert and oriented, No acute distress Cardiovascular: Regular rate  Respiratory: Normal respiratory effort GI: Abdomen is soft, nontender, nondistended, no abdominal masses. No CVAT.  Genitourinary: Normal male phallus, testes are descended bilaterally and non-tender and without masses, scrotum is normal in appearance without lesions or masses, perineum is normal on inspection. Lymphatic: No lymphadenopathy Neurologic: Grossly intact, no focal deficits Psychiatric: Normal mood and affect  Laboratory Data:  No results for input(s): WBC, HGB, HCT, PLT in the last 72 hours.  No results for input(s): NA, K, CL, GLUCOSE, BUN, CALCIUM, CREATININE in the last 72 hours.  Invalid input(s): CO3   No results found for this or any previous visit (from the past 24 hour(s)). Recent Results (from the past 240 hour(s))  SARS CORONAVIRUS 2 (TAT 6-24 HRS) Nasopharyngeal Nasopharyngeal Swab     Status: None   Collection Time: 09/01/19  2:19 PM   Specimen: Nasopharyngeal Swab  Result Value Ref Range Status   SARS Coronavirus 2 NEGATIVE NEGATIVE Final    Comment: (NOTE) SARS-CoV-2 target nucleic acids are NOT DETECTED. The SARS-CoV-2 RNA is  generally detectable in upper and lower respiratory specimens during the acute phase of infection. Negative results do not preclude SARS-CoV-2 infection, do not rule out co-infections with other pathogens, and should not be used as the sole basis for treatment or other patient management decisions. Negative results must be combined with clinical observations, patient history, and epidemiological information. The expected result is Negative. Fact Sheet for Patients: SugarRoll.be Fact Sheet for Healthcare Providers: https://www.woods-mathews.com/ This test is not yet approved or cleared by the [place] FDA and  has been authorized for detection and/or diagnosis of SARS-CoV-2 by FDA under an Emergency Use Authorization (EUA). This EUA will remain  in effect (meaning this test can be used) for the duration of the COVID-19 declaration under Section 56 4(b)(1) of the Act, 21 U.S.C. section 360bbb-3(b)(1), unless the authorization is terminated or revoked sooner. Performed at [facility], [place] [address], [place], [place] [address]     Renal Function: No results for input(s): CREATININE in the last 168 hours. CrCl cannot be calculated (Patient's  most recent lab result is older than the maximum 21 days allowed.).  Radiologic Imaging: No results found.  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

## 2019-09-05 NOTE — Op Note (Signed)
Preoperative diagnosis: High-grade nonmuscle invasive bladder cancer, status post prior resection in March, 2021  Postoperative diagnosis: Same  Principal procedure: Repeat TURBT, 3 cm area of resection, cauterization of small papillary tumors in the right supra trigonal region  Surgeon: Imara Standiford  Anesthesia: General with LMA  Complications: None  Specimen: Bladder tumor base from prior area of resection  Drains: 18 French Foley catheter  Estimated blood loss: Less than 5 mL  Indications: 86 year old male status post initial resection of left sided bladder tumors on 3.18.2021.  Pathology revealed nonmuscle invasive but high-grade bladder cancer.  Gemcitabine was placed.  The patient presents at this time for second stage resection.  He is aware of the procedure as well as risks, complications as well as the outpatient nature of this procedure.  He desires to proceed.  Findings: Urethra was normal.  Prostatic urethra was significant for by lobar hypertrophy with obstruction.  No urothelial lesions were noted within the prostatic urethra.  Bladder was inspected circumferentially.  Mild trabeculations scattered throughout.  Prior areas of resection in the left posterior bladder just superior to the left ureteral orifice as well as the left bladder neck extending from the 5:00 to the 2 o'clock position.  Fibrinous material was noted especially on the bladder neck area.  They were 3 tiny papillary lesions just superior to the right ureteral orifice.  No other urothelial lesions were noted.  Ureteral orifice ease were normal in configuration location.  Description of procedure: The patient was properly identified in the holding area.  Was taken to the operating room where general anesthetic was administered with the LMA.  He was placed in the dorsolithotomy position.  Genitalia and perineum were prepped and draped.  Proper timeout performed.  75 French resectoscope sheath was placed using the  visual obturator.  Once in the bladder, inspection was carried out with the above-mentioned findings.  The visual obturator was removed and replaced with the resectoscope and cutting loop.  The 2 areas of prior resection, just superior to the left ureteral orifice and the left bladder neck were resected down into the muscular tissue.  Wide resection was performed encompassing the old areas of resection.  Cautery was utilized to provide hemostasis in these resected areas.  The 3 papillary tumors noted above the right ureteral orifice were cauterized.  The resected fragments were then irrigated from the bladder and sent for pathology labeled "bladder tumor".  Reinspection of the entire bladder revealed no further urothelial tumors and adequate hemostasis on the resected sites.  At this point the scope was removed.  Foley catheter was placed.  This was hooked to dependent drainage following inflation of the balloon with 10 cc of water.  At this point, the procedure was terminated.  The patient was awakened, taken to the PACU in stable condition.  He tolerated the procedure well.

## 2019-09-05 NOTE — Anesthesia Procedure Notes (Signed)
Procedure Name: LMA Insertion Date/Time: 09/05/2019 9:27 AM Performed by: Mechele Claude, CRNA Pre-anesthesia Checklist: Patient identified, Emergency Drugs available, Suction available and Patient being monitored Patient Re-evaluated:Patient Re-evaluated prior to induction Oxygen Delivery Method: Circle system utilized Preoxygenation: Pre-oxygenation with 100% oxygen Induction Type: IV induction Ventilation: Mask ventilation without difficulty LMA: LMA inserted LMA Size: 4.0 Number of attempts: 1 Airway Equipment and Method: Bite block Placement Confirmation: positive ETCO2 Tube secured with: Tape Dental Injury: Teeth and Oropharynx as per pre-operative assessment

## 2019-09-06 LAB — SURGICAL PATHOLOGY

## 2019-09-07 ENCOUNTER — Ambulatory Visit: Payer: Medicare Other

## 2019-09-07 ENCOUNTER — Other Ambulatory Visit: Payer: Medicare Other

## 2019-09-07 ENCOUNTER — Ambulatory Visit: Payer: Medicare Other | Admitting: Hematology & Oncology

## 2019-09-08 ENCOUNTER — Telehealth: Payer: Self-pay | Admitting: Hematology & Oncology

## 2019-09-08 ENCOUNTER — Other Ambulatory Visit: Payer: Self-pay

## 2019-09-08 ENCOUNTER — Inpatient Hospital Stay: Payer: Medicare Other

## 2019-09-08 ENCOUNTER — Inpatient Hospital Stay (HOSPITAL_BASED_OUTPATIENT_CLINIC_OR_DEPARTMENT_OTHER): Payer: Medicare Other | Admitting: Hematology & Oncology

## 2019-09-08 ENCOUNTER — Encounter: Payer: Self-pay | Admitting: Hematology & Oncology

## 2019-09-08 ENCOUNTER — Inpatient Hospital Stay: Payer: Medicare Other | Attending: Hematology & Oncology

## 2019-09-08 VITALS — BP 155/62 | HR 70 | Temp 97.3°F | Resp 19 | Wt 154.0 lb

## 2019-09-08 DIAGNOSIS — I25708 Atherosclerosis of coronary artery bypass graft(s), unspecified, with other forms of angina pectoris: Secondary | ICD-10-CM

## 2019-09-08 DIAGNOSIS — C673 Malignant neoplasm of anterior wall of bladder: Secondary | ICD-10-CM | POA: Diagnosis not present

## 2019-09-08 DIAGNOSIS — N189 Chronic kidney disease, unspecified: Secondary | ICD-10-CM | POA: Insufficient documentation

## 2019-09-08 DIAGNOSIS — D631 Anemia in chronic kidney disease: Secondary | ICD-10-CM | POA: Diagnosis not present

## 2019-09-08 DIAGNOSIS — D500 Iron deficiency anemia secondary to blood loss (chronic): Secondary | ICD-10-CM

## 2019-09-08 DIAGNOSIS — C679 Malignant neoplasm of bladder, unspecified: Secondary | ICD-10-CM | POA: Insufficient documentation

## 2019-09-08 DIAGNOSIS — D509 Iron deficiency anemia, unspecified: Secondary | ICD-10-CM | POA: Diagnosis not present

## 2019-09-08 LAB — RETICULOCYTES
Immature Retic Fract: 8.5 % (ref 2.3–15.9)
RBC.: 3.71 MIL/uL — ABNORMAL LOW (ref 4.22–5.81)
Retic Count, Absolute: 52.7 10*3/uL (ref 19.0–186.0)
Retic Ct Pct: 1.4 % (ref 0.4–3.1)

## 2019-09-08 LAB — CBC WITH DIFFERENTIAL (CANCER CENTER ONLY)
Abs Immature Granulocytes: 0.12 10*3/uL — ABNORMAL HIGH (ref 0.00–0.07)
Basophils Absolute: 0 10*3/uL (ref 0.0–0.1)
Basophils Relative: 0 %
Eosinophils Absolute: 0.2 10*3/uL (ref 0.0–0.5)
Eosinophils Relative: 3 %
HCT: 35.3 % — ABNORMAL LOW (ref 39.0–52.0)
Hemoglobin: 11.5 g/dL — ABNORMAL LOW (ref 13.0–17.0)
Immature Granulocytes: 2 %
Lymphocytes Relative: 28 %
Lymphs Abs: 2 10*3/uL (ref 0.7–4.0)
MCH: 30.8 pg (ref 26.0–34.0)
MCHC: 32.6 g/dL (ref 30.0–36.0)
MCV: 94.6 fL (ref 80.0–100.0)
Monocytes Absolute: 0.7 10*3/uL (ref 0.1–1.0)
Monocytes Relative: 9 %
Neutro Abs: 4.2 10*3/uL (ref 1.7–7.7)
Neutrophils Relative %: 58 %
Platelet Count: 151 10*3/uL (ref 150–400)
RBC: 3.73 MIL/uL — ABNORMAL LOW (ref 4.22–5.81)
RDW: 14 % (ref 11.5–15.5)
WBC Count: 7.3 10*3/uL (ref 4.0–10.5)
nRBC: 0 % (ref 0.0–0.2)

## 2019-09-08 LAB — CMP (CANCER CENTER ONLY)
ALT: 45 U/L — ABNORMAL HIGH (ref 0–44)
AST: 36 U/L (ref 15–41)
Albumin: 4.3 g/dL (ref 3.5–5.0)
Alkaline Phosphatase: 67 U/L (ref 38–126)
Anion gap: 7 (ref 5–15)
BUN: 29 mg/dL — ABNORMAL HIGH (ref 8–23)
CO2: 27 mmol/L (ref 22–32)
Calcium: 9.5 mg/dL (ref 8.9–10.3)
Chloride: 108 mmol/L (ref 98–111)
Creatinine: 1.54 mg/dL — ABNORMAL HIGH (ref 0.61–1.24)
GFR, Est AFR Am: 46 mL/min — ABNORMAL LOW (ref >60)
GFR, Estimated: 39 mL/min — ABNORMAL LOW (ref >60)
Glucose, Bld: 111 mg/dL — ABNORMAL HIGH (ref 70–99)
Potassium: 4.5 mmol/L (ref 3.5–5.1)
Sodium: 142 mmol/L (ref 135–145)
Total Bilirubin: 0.6 mg/dL (ref 0.3–1.2)
Total Protein: 6.6 g/dL (ref 6.5–8.1)

## 2019-09-08 NOTE — Telephone Encounter (Signed)
Called and spoke with wife gave her appt date/times per 5/13 los

## 2019-09-08 NOTE — Progress Notes (Signed)
Hematology and Oncology Follow Up Visit  DAILY CRATE [id number] 1928/05/26 86 y.o. 09/08/2019   Principle Diagnosis:  Erythropoietin deficiency anemia Iron deficiency anemia  Current Therapy:   IV iron as indicated -- feraheme given on 07/27/2019 Aranesp 300 g sq prn hemoglobin < 11 - last dose given on  07/18/2019   Interim History:  [name] is here today for his regular appointment.  We actually saw him early last week.  He was not doing all that well.  He was dehydrated.  He recently had a bladder procedure and had a Foley catheter that was then.  Thankfully the catheter is now been removed.  He does have bladder cancer.  This is a superficial bladder cancer.  The pathology report ([id number]) showed a high-grade urothelial carcinoma with invasion into the lamina propria.  It sounds like he had some type of intravesicular therapy.  He did undergo another cystoscopy.  He had scrapings done.  This was done on Sep 05, 2019.  The pathology report ([id number]) showed a benign urothelium.  There was ischemic necrosis.  There was some inflammation.  There was no evidence of active tumor.  He was treated as an outpatient.  He had a Foley catheter in for 1 day.  Overall, he is doing better.  His renal function is doing much better.  His creatinine today is 1.54.  He does not need any Aranesp today.  His last iron studies back in March showed a ferritin of 367 with iron saturation 17%.  He did get some IV iron at that time.   Unfortunately, his wife is having her health issues.  Overall, his performance status is ECOG 1.  Medications:  Allergies as of 09/08/2019      Reactions   Penicillins Hives   Has patient had a PCN reaction causing immediate rash, facial/tongue/throat swelling, SOB or lightheadedness with hypotension: No Has patient had a PCN reaction causing severe rash involving mucus membranes or skin necrosis: Yes Has patient had a PCN reaction that required  hospitalization: No Has patient had a PCN reaction occurring within the last 10 years: No If all of the above answers are "NO", then may proceed with Cephalosporin use.   Vancomycin Other (See Comments)   Edema and myalgia      Medication List       Accurate as of Sep 08, 2019  1:57 PM. If you have any questions, ask your nurse or doctor.        Accu-Chek FastClix Lancets Misc Check fasting blood sugar once daily   Accu-Chek Guide test strip Generic drug: glucose blood Check fasting blood sugar once daily   allopurinol 300 MG tablet Commonly known as: ZYLOPRIM Take 0.5 tablets (150 mg total) by mouth daily.   amLODipine 5 MG tablet Commonly known as: NORVASC TAKE 1 TABLET DAILY   atorvastatin 20 MG tablet Commonly known as: LIPITOR Take 1 tablet (20 mg total) by mouth at bedtime.   blood glucose meter kit and supplies Dispense based on patient and insurance preference. Use up to four times daily as directed. R73.03   carvedilol 6.25 MG tablet Commonly known as: COREG Take 1 tablet (6.25 mg total) by mouth 2 (two) times daily with a meal.   Cholecalciferol 125 MCG (5000 UT) capsule Take 5,000 Units by mouth daily.   CHROMIUM GTF PO Take 1 capsule by mouth every evening.   Cinnamon 500 MG Tabs Take 2 tablets by mouth at bedtime.   Co Q 10  100 MG Caps Take 100 mg by mouth daily.   dicyclomine 10 MG capsule Commonly known as: BENTYL Take 1 capsule (10 mg total) by mouth 4 (four) times daily -  before meals and at bedtime.   diphenoxylate-atropine 2.5-0.025 MG tablet Commonly known as: LOMOTIL 1 tablet. Pt takes PRN   finasteride 5 MG tablet Commonly known as: PROSCAR Take 5 mg by mouth daily.   fish oil-omega-3 fatty acids 1000 MG capsule Take 1 g by mouth daily.   furosemide 20 MG tablet Commonly known as: LASIX Take 1 tablet (20 mg total) by mouth as directed. What changed:   when to take this  reasons to take this   latanoprost 0.005 %  ophthalmic solution Commonly known as: XALATAN Place 1 drop into both eyes at bedtime.   nitrofurantoin (macrocrystal-monohydrate) 100 MG capsule Commonly known as: Macrobid Take 1 capsule (100 mg total) by mouth 2 (two) times daily.   potassium chloride 10 MEQ tablet Commonly known as: KLOR-CON TAKE 1 TABLET BY MOUTH EVERY DAY   potassium chloride 10 MEQ tablet Commonly known as: KLOR-CON Take 10 mEq by mouth 2 (two) times daily.   PRESERVISION AREDS PO Take 1 tablet by mouth 2 (two) times daily.   PROBIOTIC PO Take 1 tablet by mouth daily.   tamsulosin 0.4 MG Caps capsule Commonly known as: FLOMAX TAKE 1 CAPSULE BY MOUTH ONCE DAILY AFTER SUPPER What changed: See the new instructions.   timolol 0.5 % ophthalmic solution Commonly known as: TIMOPTIC Place 1 drop into both eyes 2 (two) times daily.   VANADYL SULFATE PO Take 1 capsule by mouth every evening.   zinc gluconate 50 MG tablet Take 50 mg by mouth daily.       Allergies:  Allergies  Allergen Reactions  . Penicillins Hives    Has patient had a PCN reaction causing immediate rash, facial/tongue/throat swelling, SOB or lightheadedness with hypotension: No Has patient had a PCN reaction causing severe rash involving mucus membranes or skin necrosis: Yes Has patient had a PCN reaction that required hospitalization: No Has patient had a PCN reaction occurring within the last 10 years: No If all of the above answers are "NO", then may proceed with Cephalosporin use.   . Vancomycin Other (See Comments)    Edema and myalgia    Past Medical History, Surgical history, Social history, and Family History were reviewed and updated.  Review of Systems: Review of Systems  Constitutional: Negative.   HENT: Negative.   Eyes: Negative.   Respiratory: Negative.   Cardiovascular: Positive for palpitations.  Gastrointestinal: Negative.   Genitourinary: Negative.   Musculoskeletal: Negative.   Skin: Negative.     Neurological: Negative.   Endo/Heme/Allergies: Negative.   Psychiatric/Behavioral: Negative.      Physical Exam:  weight is 154 lb (69.9 kg). His temporal temperature is 97.3 F (36.3 C) (abnormal). His blood pressure is 155/62 (abnormal) and his pulse is 70. His respiration is 19 and oxygen saturation is 100%.   Wt Readings from Last 3 Encounters:  09/08/19 154 lb (69.9 kg)  09/05/19 153 lb (69.4 kg)  08/01/19 153 lb 3.2 oz (69.5 kg)    Physical Exam Vitals reviewed.  HENT:     Head: Normocephalic and atraumatic.  Eyes:     Pupils: Pupils are equal, round, and reactive to light.  Cardiovascular:     Rate and Rhythm: Normal rate and regular rhythm.     Heart sounds: Normal heart sounds.  Pulmonary:  Effort: Pulmonary effort is normal.     Breath sounds: Normal breath sounds.  Abdominal:     General: Bowel sounds are normal.     Palpations: Abdomen is soft.  Musculoskeletal:        General: No tenderness or deformity. Normal range of motion.     Cervical back: Normal range of motion.  Lymphadenopathy:     Cervical: No cervical adenopathy.  Skin:    General: Skin is warm and dry.     Findings: No erythema or rash.  Neurological:     Mental Status: He is alert and oriented to person, place, and time.  Psychiatric:        Behavior: Behavior normal.        Thought Content: Thought content normal.        Judgment: Judgment normal.      Lab Results  Component Value Date   WBC 7.3 09/08/2019   HGB 11.5 (L) 09/08/2019   HCT 35.3 (L) 09/08/2019   MCV 94.6 09/08/2019   PLT 151 09/08/2019   Lab Results  Component Value Date   FERRITIN 367 (H) 07/27/2019   IRON 40 (L) 07/27/2019   TIBC 231 07/27/2019   UIBC 191 07/27/2019   IRONPCTSAT 17 (L) 07/27/2019   Lab Results  Component Value Date   RETICCTPCT 1.4 09/08/2019   RBC 3.73 (L) 09/08/2019   RBC 3.71 (L) 09/08/2019   No results found for: KPAFRELGTCHN, LAMBDASER, KAPLAMBRATIO No results found for:  Kandis Cocking, IGMSERUM Lab Results  Component Value Date   TOTALPROTELP 6.0 (L) 12/26/2015   ALBUMINELP 3.4 (L) 12/26/2015   A1GS 0.4 (H) 12/26/2015   A2GS 0.8 12/26/2015   BETS 0.4 12/26/2015   BETA2SER 0.3 12/26/2015   GAMS 0.8 12/26/2015   MSPIKE Not Observed 01/29/2016   SPEI SEE NOTE 12/26/2015     Chemistry      Component Value Date/Time   NA 142 09/08/2019 1308   NA 141 08/11/2017 0924   NA 145 04/09/2017 0905   NA 140 03/28/2016 0853   K 4.5 09/08/2019 1308   K 4.5 04/09/2017 0905   K 4.4 03/28/2016 0853   CL 108 09/08/2019 1308   CL 105 04/09/2017 0905   CO2 27 09/08/2019 1308   CO2 27 04/09/2017 0905   CO2 19 (L) 03/28/2016 0853   BUN 29 (H) 09/08/2019 1308   BUN 27 08/11/2017 0924   BUN 33 (H) 04/09/2017 0905   BUN 34.4 (H) 03/28/2016 0853   CREATININE 1.54 (H) 09/08/2019 1308   CREATININE 1.72 (H) 08/18/2017 1543   CREATININE 1.7 (H) 03/28/2016 0853   GLU 113 07/24/2016 0000      Component Value Date/Time   CALCIUM 9.5 09/08/2019 1308   CALCIUM 9.2 04/09/2017 0905   CALCIUM 9.2 03/28/2016 0853   ALKPHOS 67 09/08/2019 1308   ALKPHOS 69 04/09/2017 0905   ALKPHOS 85 03/28/2016 0853   AST 36 09/08/2019 1308   AST 23 03/28/2016 0853   ALT 45 (H) 09/08/2019 1308   ALT 27 04/09/2017 0905   ALT 37 03/28/2016 0853   BILITOT 0.6 09/08/2019 1308   BILITOT 0.79 03/28/2016 0853      Impression and Plan: [name] is a very pleasant 86 yo caucasian gentleman with both erythropoietin deficiency and iron deficiency anemia.   I am so happy that the repeat bladder biopsies were negative.  He obviously is getting fantastic care by the urologist.  We will plan to get her back now  in another 6 weeks or so.  Hopefully, his hemoglobin will maintain itself.  [name], MD 5/13/20211:57 PM

## 2019-09-09 LAB — IRON AND TIBC
Iron: 77 ug/dL (ref 42–163)
Saturation Ratios: 29 % (ref 20–55)
TIBC: 268 ug/dL (ref 202–409)
UIBC: 190 ug/dL (ref 117–376)

## 2019-09-09 LAB — FERRITIN: Ferritin: 846 ng/mL — ABNORMAL HIGH (ref 24–336)

## 2019-09-18 IMAGING — DX DG CHEST 2V
2 series · 2 of 2 positions shown · non-contrast
Comparison: 03/17/2017 and prior radiographs

CLINICAL DATA: Irregular heart rate, shortness of breath and
palpitations for several days.

EXAM:
CHEST  2 VIEW

[chest pa]
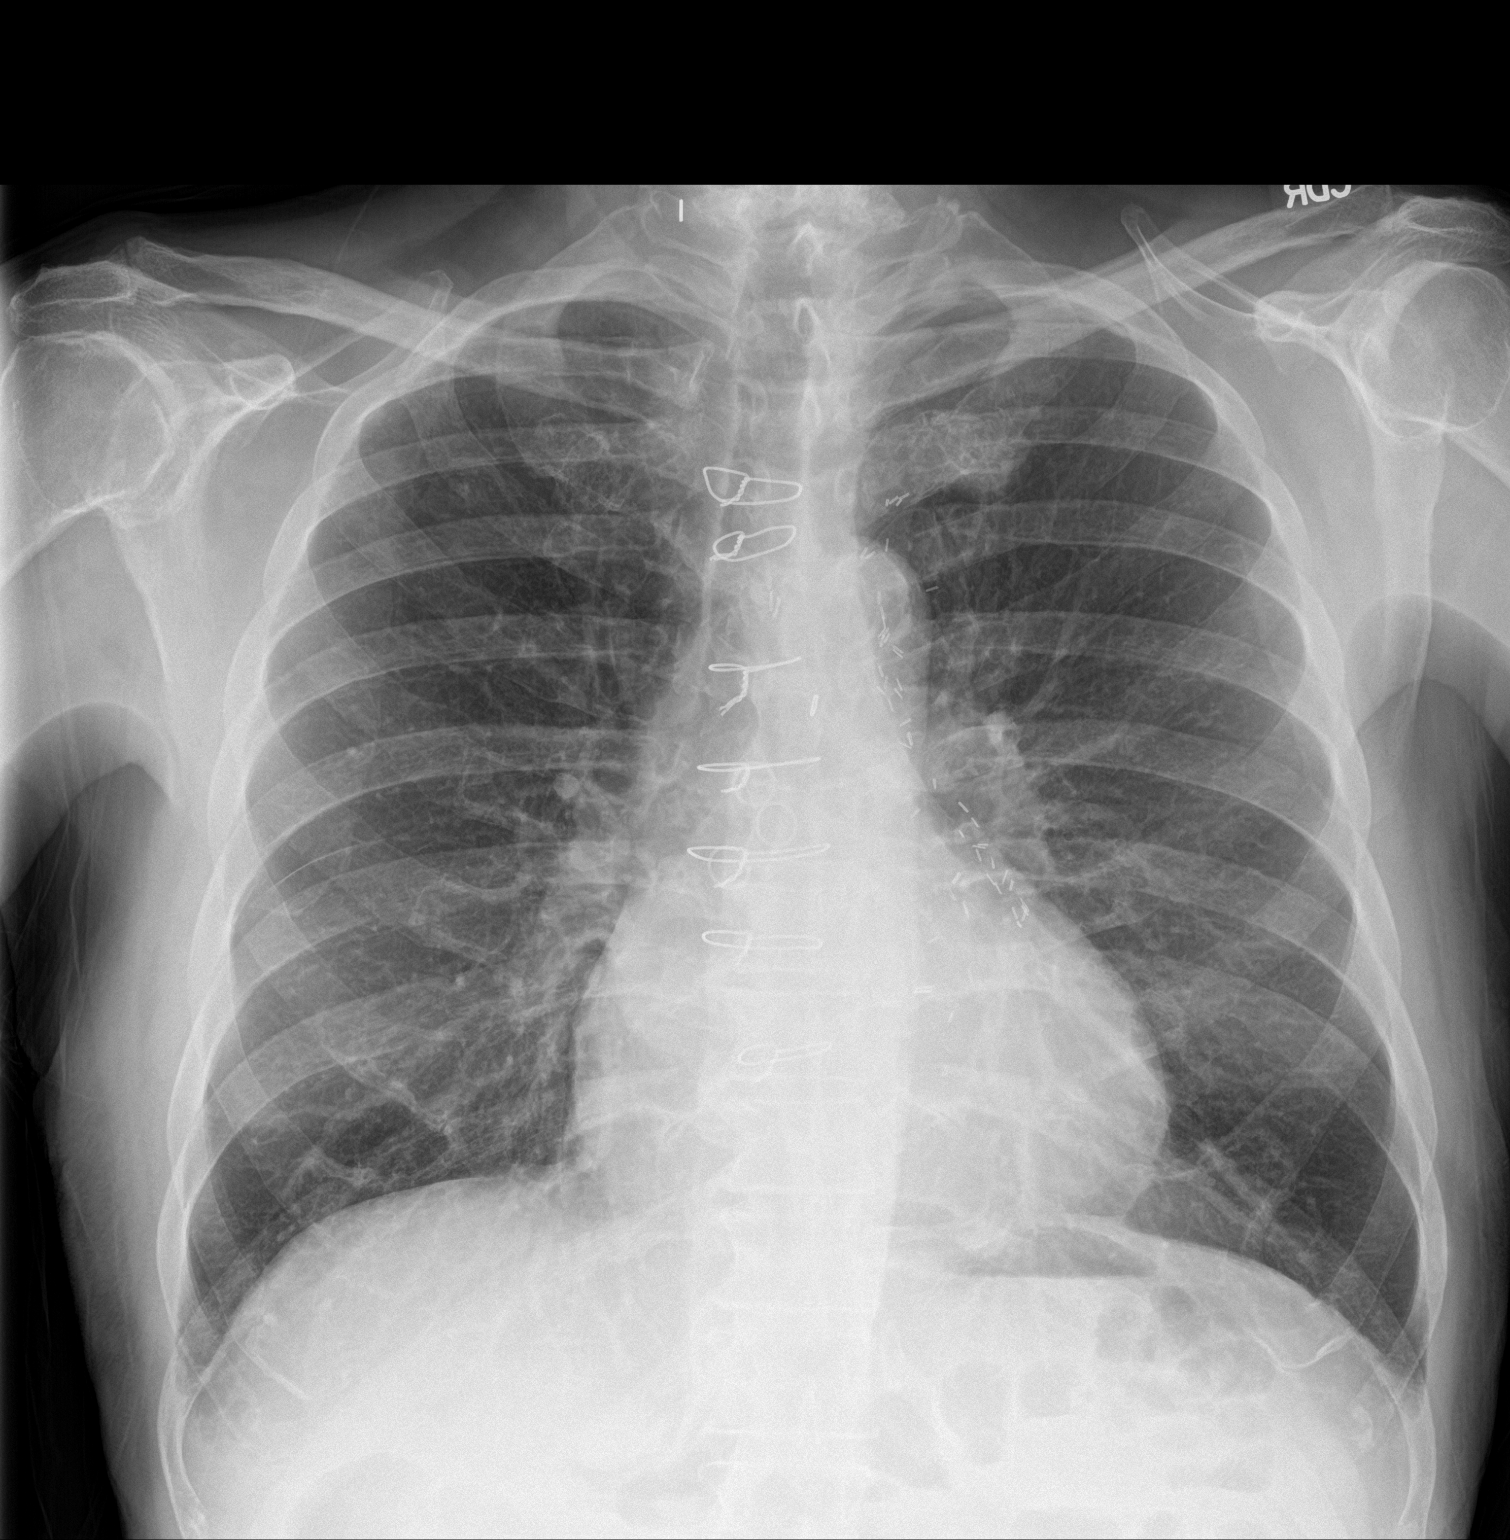

[chest lat]
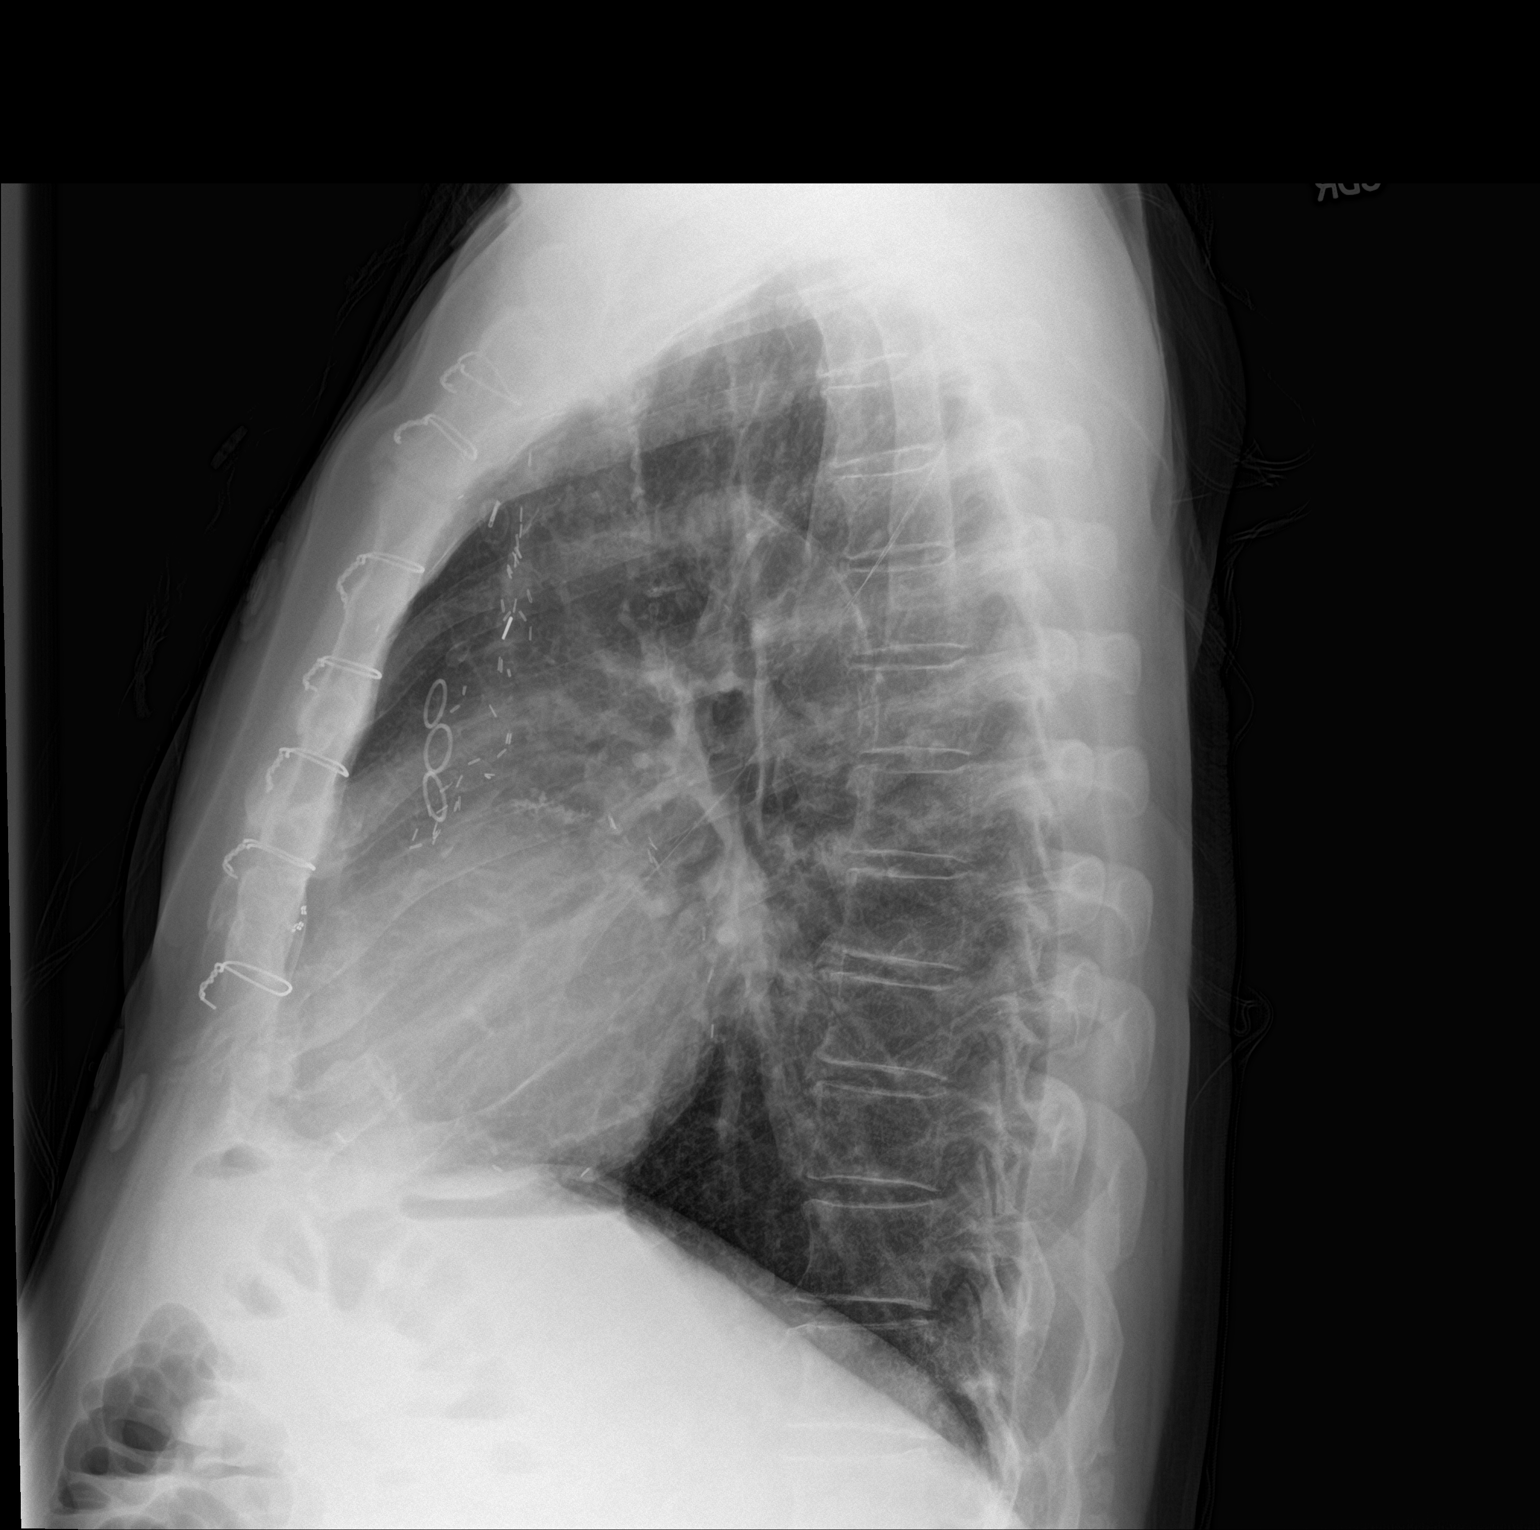

[2 of 2 positions shown; findings below may reference images not displayed]

FINDINGS: Cardiomegaly and CABG changes again noted.

There is no evidence of focal airspace disease, pulmonary edema,
suspicious pulmonary nodule/mass, pleural effusion, or pneumothorax.

No acute bony abnormalities are identified.
IMPRESSION: Cardiomegaly without evidence of acute cardiopulmonary disease.

## 2019-09-28 DIAGNOSIS — H353134 Nonexudative age-related macular degeneration, bilateral, advanced atrophic with subfoveal involvement: Secondary | ICD-10-CM | POA: Diagnosis not present

## 2019-09-28 DIAGNOSIS — H401134 Primary open-angle glaucoma, bilateral, indeterminate stage: Secondary | ICD-10-CM | POA: Diagnosis not present

## 2019-10-03 DIAGNOSIS — N401 Enlarged prostate with lower urinary tract symptoms: Secondary | ICD-10-CM | POA: Diagnosis not present

## 2019-10-03 DIAGNOSIS — C678 Malignant neoplasm of overlapping sites of bladder: Secondary | ICD-10-CM | POA: Diagnosis not present

## 2019-10-03 DIAGNOSIS — Z5111 Encounter for antineoplastic chemotherapy: Secondary | ICD-10-CM | POA: Diagnosis not present

## 2019-10-07 DIAGNOSIS — N183 Chronic kidney disease, stage 3 unspecified: Secondary | ICD-10-CM | POA: Diagnosis not present

## 2019-10-07 DIAGNOSIS — I129 Hypertensive chronic kidney disease with stage 1 through stage 4 chronic kidney disease, or unspecified chronic kidney disease: Secondary | ICD-10-CM | POA: Diagnosis not present

## 2019-10-07 DIAGNOSIS — I4891 Unspecified atrial fibrillation: Secondary | ICD-10-CM | POA: Diagnosis not present

## 2019-10-07 DIAGNOSIS — D472 Monoclonal gammopathy: Secondary | ICD-10-CM | POA: Diagnosis not present

## 2019-10-21 ENCOUNTER — Encounter: Payer: Self-pay | Admitting: Hematology & Oncology

## 2019-10-21 ENCOUNTER — Inpatient Hospital Stay: Payer: Medicare Other

## 2019-10-21 ENCOUNTER — Inpatient Hospital Stay (HOSPITAL_BASED_OUTPATIENT_CLINIC_OR_DEPARTMENT_OTHER): Payer: Medicare Other | Admitting: Hematology & Oncology

## 2019-10-21 ENCOUNTER — Other Ambulatory Visit: Payer: Self-pay

## 2019-10-21 ENCOUNTER — Inpatient Hospital Stay: Payer: Medicare Other | Attending: Hematology & Oncology

## 2019-10-21 VITALS — BP 151/50 | HR 91 | Temp 96.6°F | Resp 19 | Wt 152.0 lb

## 2019-10-21 DIAGNOSIS — R002 Palpitations: Secondary | ICD-10-CM | POA: Diagnosis not present

## 2019-10-21 DIAGNOSIS — D509 Iron deficiency anemia, unspecified: Secondary | ICD-10-CM | POA: Insufficient documentation

## 2019-10-21 DIAGNOSIS — C679 Malignant neoplasm of bladder, unspecified: Secondary | ICD-10-CM | POA: Diagnosis not present

## 2019-10-21 DIAGNOSIS — I25708 Atherosclerosis of coronary artery bypass graft(s), unspecified, with other forms of angina pectoris: Secondary | ICD-10-CM

## 2019-10-21 DIAGNOSIS — Z881 Allergy status to other antibiotic agents status: Secondary | ICD-10-CM | POA: Insufficient documentation

## 2019-10-21 DIAGNOSIS — Z79899 Other long term (current) drug therapy: Secondary | ICD-10-CM | POA: Diagnosis not present

## 2019-10-21 DIAGNOSIS — C673 Malignant neoplasm of anterior wall of bladder: Secondary | ICD-10-CM

## 2019-10-21 DIAGNOSIS — Z880 Allergy status to penicillin: Secondary | ICD-10-CM | POA: Diagnosis not present

## 2019-10-21 DIAGNOSIS — D500 Iron deficiency anemia secondary to blood loss (chronic): Secondary | ICD-10-CM | POA: Diagnosis not present

## 2019-10-21 LAB — CBC WITH DIFFERENTIAL (CANCER CENTER ONLY)
Abs Immature Granulocytes: 0.08 10*3/uL — ABNORMAL HIGH (ref 0.00–0.07)
Basophils Absolute: 0 10*3/uL (ref 0.0–0.1)
Basophils Relative: 0 %
Eosinophils Absolute: 0.1 10*3/uL (ref 0.0–0.5)
Eosinophils Relative: 2 %
HCT: 33.3 % — ABNORMAL LOW (ref 39.0–52.0)
Hemoglobin: 11.2 g/dL — ABNORMAL LOW (ref 13.0–17.0)
Immature Granulocytes: 1 %
Lymphocytes Relative: 32 %
Lymphs Abs: 2.4 10*3/uL (ref 0.7–4.0)
MCH: 31 pg (ref 26.0–34.0)
MCHC: 33.6 g/dL (ref 30.0–36.0)
MCV: 92.2 fL (ref 80.0–100.0)
Monocytes Absolute: 0.7 10*3/uL (ref 0.1–1.0)
Monocytes Relative: 9 %
Neutro Abs: 4.3 10*3/uL (ref 1.7–7.7)
Neutrophils Relative %: 56 %
Platelet Count: 156 10*3/uL (ref 150–400)
RBC: 3.61 MIL/uL — ABNORMAL LOW (ref 4.22–5.81)
RDW: 14.6 % (ref 11.5–15.5)
WBC Count: 7.6 10*3/uL (ref 4.0–10.5)
nRBC: 0 % (ref 0.0–0.2)

## 2019-10-21 LAB — CMP (CANCER CENTER ONLY)
ALT: 27 U/L (ref 0–44)
AST: 25 U/L (ref 15–41)
Albumin: 4.1 g/dL (ref 3.5–5.0)
Alkaline Phosphatase: 59 U/L (ref 38–126)
Anion gap: 8 (ref 5–15)
BUN: 32 mg/dL — ABNORMAL HIGH (ref 8–23)
CO2: 23 mmol/L (ref 22–32)
Calcium: 9 mg/dL (ref 8.9–10.3)
Chloride: 108 mmol/L (ref 98–111)
Creatinine: 1.9 mg/dL — ABNORMAL HIGH (ref 0.61–1.24)
GFR, Est AFR Am: 35 mL/min — ABNORMAL LOW (ref >60)
GFR, Estimated: 31 mL/min — ABNORMAL LOW (ref >60)
Glucose, Bld: 153 mg/dL — ABNORMAL HIGH (ref 70–99)
Potassium: 4.2 mmol/L (ref 3.5–5.1)
Sodium: 139 mmol/L (ref 135–145)
Total Bilirubin: 0.5 mg/dL (ref 0.3–1.2)
Total Protein: 6.9 g/dL (ref 6.5–8.1)

## 2019-10-21 LAB — RETICULOCYTES
Immature Retic Fract: 14.2 % (ref 2.3–15.9)
RBC.: 3.66 MIL/uL — ABNORMAL LOW (ref 4.22–5.81)
Retic Count, Absolute: 69.9 10*3/uL (ref 19.0–186.0)
Retic Ct Pct: 1.9 % (ref 0.4–3.1)

## 2019-10-21 MED ORDER — DARBEPOETIN ALFA 300 MCG/0.6ML IJ SOSY
PREFILLED_SYRINGE | INTRAMUSCULAR | Status: AC
  Filled 2019-10-21: qty 0.6

## 2019-10-21 NOTE — Progress Notes (Signed)
Hematology and Oncology Follow Up Visit  William LORENSON [id number] 1929/02/13 86 y.o. 10/21/2019   Principle Diagnosis:  Erythropoietin deficiency anemia Iron deficiency anemia  Current Therapy:   IV iron as indicated -- feraheme given on 07/27/2019 Aranesp 300 g sq prn hemoglobin < 11 - last dose given on  07/18/2019   Interim History:  Mr. Aldaco is here today for follow-up.  He is doing pretty well.  He has a bladder cancer.  He is getting intravesicular BCG.  He has had 3 of 6 treatment so far.  He has done well with this.  He has had no problems with fever.  There is been no bleeding.  He has had no abdominal pain.  He has had no nausea or vomiting.  When we last saw him back in May, his ferritin was 846 with an iron saturation of 29%.  His appetite is pretty decent.  He has been very diligent with avoiding the coronavirus.  He has had the vaccine.  I am glad to see that his renal function seems to be holding on fairly well right now.  Overall, his performance status is ECOG 2.  Medications:  Allergies as of 10/21/2019      Reactions   Penicillins Hives   Has patient had a PCN reaction causing immediate rash, facial/tongue/throat swelling, SOB or lightheadedness with hypotension: No Has patient had a PCN reaction causing severe rash involving mucus membranes or skin necrosis: Yes Has patient had a PCN reaction that required hospitalization: No Has patient had a PCN reaction occurring within the last 10 years: No If all of the above answers are "NO", then may proceed with Cephalosporin use.   Vancomycin Other (See Comments)   Edema and myalgia      Medication List       Accurate as of October 21, 2019  3:16 PM. If you have any questions, ask your nurse or doctor.        Accu-Chek FastClix Lancets Misc Check fasting blood sugar once daily   Accu-Chek Guide test strip Generic drug: glucose blood Check fasting blood sugar once daily   allopurinol 300 MG  tablet Commonly known as: ZYLOPRIM Take 0.5 tablets (150 mg total) by mouth daily.   amLODipine 5 MG tablet Commonly known as: NORVASC TAKE 1 TABLET DAILY   atorvastatin 20 MG tablet Commonly known as: LIPITOR Take 1 tablet (20 mg total) by mouth at bedtime.   blood glucose meter kit and supplies Dispense based on patient and insurance preference. Use up to four times daily as directed. R73.03   carvedilol 6.25 MG tablet Commonly known as: COREG Take 1 tablet (6.25 mg total) by mouth 2 (two) times daily with a meal.   Cholecalciferol 125 MCG (5000 UT) capsule Take 5,000 Units by mouth daily.   CHROMIUM GTF PO Take 1 capsule by mouth every evening.   Cinnamon 500 MG Tabs Take 2 tablets by mouth at bedtime.   Co Q 10 100 MG Caps Take 100 mg by mouth daily.   dicyclomine 10 MG capsule Commonly known as: BENTYL Take 1 capsule (10 mg total) by mouth 4 (four) times daily -  before meals and at bedtime.   diphenoxylate-atropine 2.5-0.025 MG tablet Commonly known as: LOMOTIL 1 tablet. Pt takes PRN   finasteride 5 MG tablet Commonly known as: PROSCAR Take 5 mg by mouth daily.   fish oil-omega-3 fatty acids 1000 MG capsule Take 1 g by mouth daily.   furosemide 20 MG  tablet Commonly known as: LASIX Take 1 tablet (20 mg total) by mouth as directed. What changed:   when to take this  reasons to take this   latanoprost 0.005 % ophthalmic solution Commonly known as: XALATAN Place 1 drop into both eyes at bedtime.   nitrofurantoin (macrocrystal-monohydrate) 100 MG capsule Commonly known as: Macrobid Take 1 capsule (100 mg total) by mouth 2 (two) times daily.   potassium chloride 10 MEQ tablet Commonly known as: KLOR-CON TAKE 1 TABLET BY MOUTH EVERY DAY   potassium chloride 10 MEQ tablet Commonly known as: KLOR-CON Take 10 mEq by mouth 2 (two) times daily.   PRESERVISION AREDS PO Take 1 tablet by mouth 2 (two) times daily.   PROBIOTIC PO Take 1 tablet by  mouth daily.   tamsulosin 0.4 MG Caps capsule Commonly known as: FLOMAX TAKE 1 CAPSULE BY MOUTH ONCE DAILY AFTER SUPPER What changed: See the new instructions.   timolol 0.5 % ophthalmic solution Commonly known as: TIMOPTIC Place 1 drop into both eyes 2 (two) times daily.   VANADYL SULFATE PO Take 1 capsule by mouth every evening.   zinc gluconate 50 MG tablet Take 50 mg by mouth daily.       Allergies:  Allergies  Allergen Reactions  . Penicillins Hives    Has patient had a PCN reaction causing immediate rash, facial/tongue/throat swelling, SOB or lightheadedness with hypotension: No Has patient had a PCN reaction causing severe rash involving mucus membranes or skin necrosis: Yes Has patient had a PCN reaction that required hospitalization: No Has patient had a PCN reaction occurring within the last 10 years: No If all of the above answers are "NO", then may proceed with Cephalosporin use.   . Vancomycin Other (See Comments)    Edema and myalgia    Past Medical History, Surgical history, Social history, and Family History were reviewed and updated.  Review of Systems: Review of Systems  Constitutional: Negative.   HENT: Negative.   Eyes: Negative.   Respiratory: Negative.   Cardiovascular: Positive for palpitations.  Gastrointestinal: Negative.   Genitourinary: Negative.   Musculoskeletal: Negative.   Skin: Negative.   Neurological: Negative.   Endo/Heme/Allergies: Negative.   Psychiatric/Behavioral: Negative.      Physical Exam:  weight is 152 lb (68.9 kg). His temporal temperature is 96.6 F (35.9 C) (abnormal). His blood pressure is 151/50 (abnormal) and his pulse is 91. His respiration is 19 and oxygen saturation is 100%.   Wt Readings from Last 3 Encounters:  10/21/19 152 lb (68.9 kg)  09/08/19 154 lb (69.9 kg)  09/05/19 153 lb (69.4 kg)    Physical Exam Vitals reviewed.  HENT:     Head: Normocephalic and atraumatic.  Eyes:     Pupils: Pupils  are equal, round, and reactive to light.  Cardiovascular:     Rate and Rhythm: Normal rate and regular rhythm.     Heart sounds: Normal heart sounds.  Pulmonary:     Effort: Pulmonary effort is normal.     Breath sounds: Normal breath sounds.  Abdominal:     General: Bowel sounds are normal.     Palpations: Abdomen is soft.  Musculoskeletal:        General: No tenderness or deformity. Normal range of motion.     Cervical back: Normal range of motion.  Lymphadenopathy:     Cervical: No cervical adenopathy.  Skin:    General: Skin is warm and dry.     Findings: No erythema or  rash.  Neurological:     Mental Status: He is alert and oriented to person, place, and time.  Psychiatric:        Behavior: Behavior normal.        Thought Content: Thought content normal.        Judgment: Judgment normal.      Lab Results  Component Value Date   WBC 7.6 10/21/2019   HGB 11.2 (L) 10/21/2019   HCT 33.3 (L) 10/21/2019   MCV 92.2 10/21/2019   PLT 156 10/21/2019   Lab Results  Component Value Date   FERRITIN 846 (H) 09/08/2019   IRON 77 09/08/2019   TIBC 268 09/08/2019   UIBC 190 09/08/2019   IRONPCTSAT 29 09/08/2019   Lab Results  Component Value Date   RETICCTPCT 1.9 10/21/2019   RBC 3.61 (L) 10/21/2019   RBC 3.66 (L) 10/21/2019   No results found for: KPAFRELGTCHN, LAMBDASER, KAPLAMBRATIO No results found for: Kandis Cocking, IGMSERUM Lab Results  Component Value Date   TOTALPROTELP 6.0 (L) 12/26/2015   ALBUMINELP 3.4 (L) 12/26/2015   A1GS 0.4 (H) 12/26/2015   A2GS 0.8 12/26/2015   BETS 0.4 12/26/2015   BETA2SER 0.3 12/26/2015   GAMS 0.8 12/26/2015   MSPIKE Not Observed 01/29/2016   SPEI SEE NOTE 12/26/2015     Chemistry      Component Value Date/Time   NA 139 10/21/2019 1415   NA 141 08/11/2017 0924   NA 145 04/09/2017 0905   NA 140 03/28/2016 0853   K 4.2 10/21/2019 1415   K 4.5 04/09/2017 0905   K 4.4 03/28/2016 0853   CL 108 10/21/2019 1415   CL 105  04/09/2017 0905   CO2 23 10/21/2019 1415   CO2 27 04/09/2017 0905   CO2 19 (L) 03/28/2016 0853   BUN 32 (H) 10/21/2019 1415   BUN 27 08/11/2017 0924   BUN 33 (H) 04/09/2017 0905   BUN 34.4 (H) 03/28/2016 0853   CREATININE 1.90 (H) 10/21/2019 1415   CREATININE 1.72 (H) 08/18/2017 1543   CREATININE 1.7 (H) 03/28/2016 0853   GLU 113 07/24/2016 0000      Component Value Date/Time   CALCIUM 9.0 10/21/2019 1415   CALCIUM 9.2 04/09/2017 0905   CALCIUM 9.2 03/28/2016 0853   ALKPHOS 59 10/21/2019 1415   ALKPHOS 69 04/09/2017 0905   ALKPHOS 85 03/28/2016 0853   AST 25 10/21/2019 1415   AST 23 03/28/2016 0853   ALT 27 10/21/2019 1415   ALT 27 04/09/2017 0905   ALT 37 03/28/2016 0853   BILITOT 0.5 10/21/2019 1415   BILITOT 0.79 03/28/2016 0853      Impression and Plan: Mr. Rehberg is a very pleasant 86 yo caucasian gentleman with both erythropoietin deficiency and iron deficiency anemia.   I would be surprised if his iron level is low this time.  However, given that he does have the bladder cancer, there may be some microscopic bleeding.  He does not need Aranesp.  If there is any problems, I see him in church on Sundays.  He was less me know if there is anything that we can do for him..  We will plan to get her back now in another 6 weeks or so.  Hopefully, his hemoglobin will maintain itself.  [name], MD 6/25/20213:16 PM

## 2019-10-24 ENCOUNTER — Telehealth: Payer: Self-pay

## 2019-10-24 LAB — IRON AND TIBC
Iron: 79 ug/dL (ref 42–163)
Saturation Ratios: 31 % (ref 20–55)
TIBC: 251 ug/dL (ref 202–409)
UIBC: 172 ug/dL (ref 117–376)

## 2019-10-24 LAB — FERRITIN: Ferritin: 505 ng/mL — ABNORMAL HIGH (ref 24–336)

## 2019-10-24 NOTE — Telephone Encounter (Signed)
Pt called requesting a refill on Dicyclomine 10 MG capsule.  I let the pt know provider has not filled this script since 2019 and wanted to make sure he has not had any recent new symptoms requiring a refill.  Pt stated no new symptoms, he had plenty of capsules from previous refills.  He just said it works really well for him.  Please advise.

## 2019-10-24 NOTE — Telephone Encounter (Signed)
Pt was called and detailed message was left letting plt know he was due in Jan for his [facility] appt and he needed to come for appt to get refills since it has been almost one yr.

## 2019-10-27 IMAGING — DX DG CHEST 2V
2 series · 2 of 2 positions shown · non-contrast
Comparison: 06/20/2017

CLINICAL DATA: Cardiac device in situ.

EXAM:
CHEST - 2 VIEW

[chest pa]
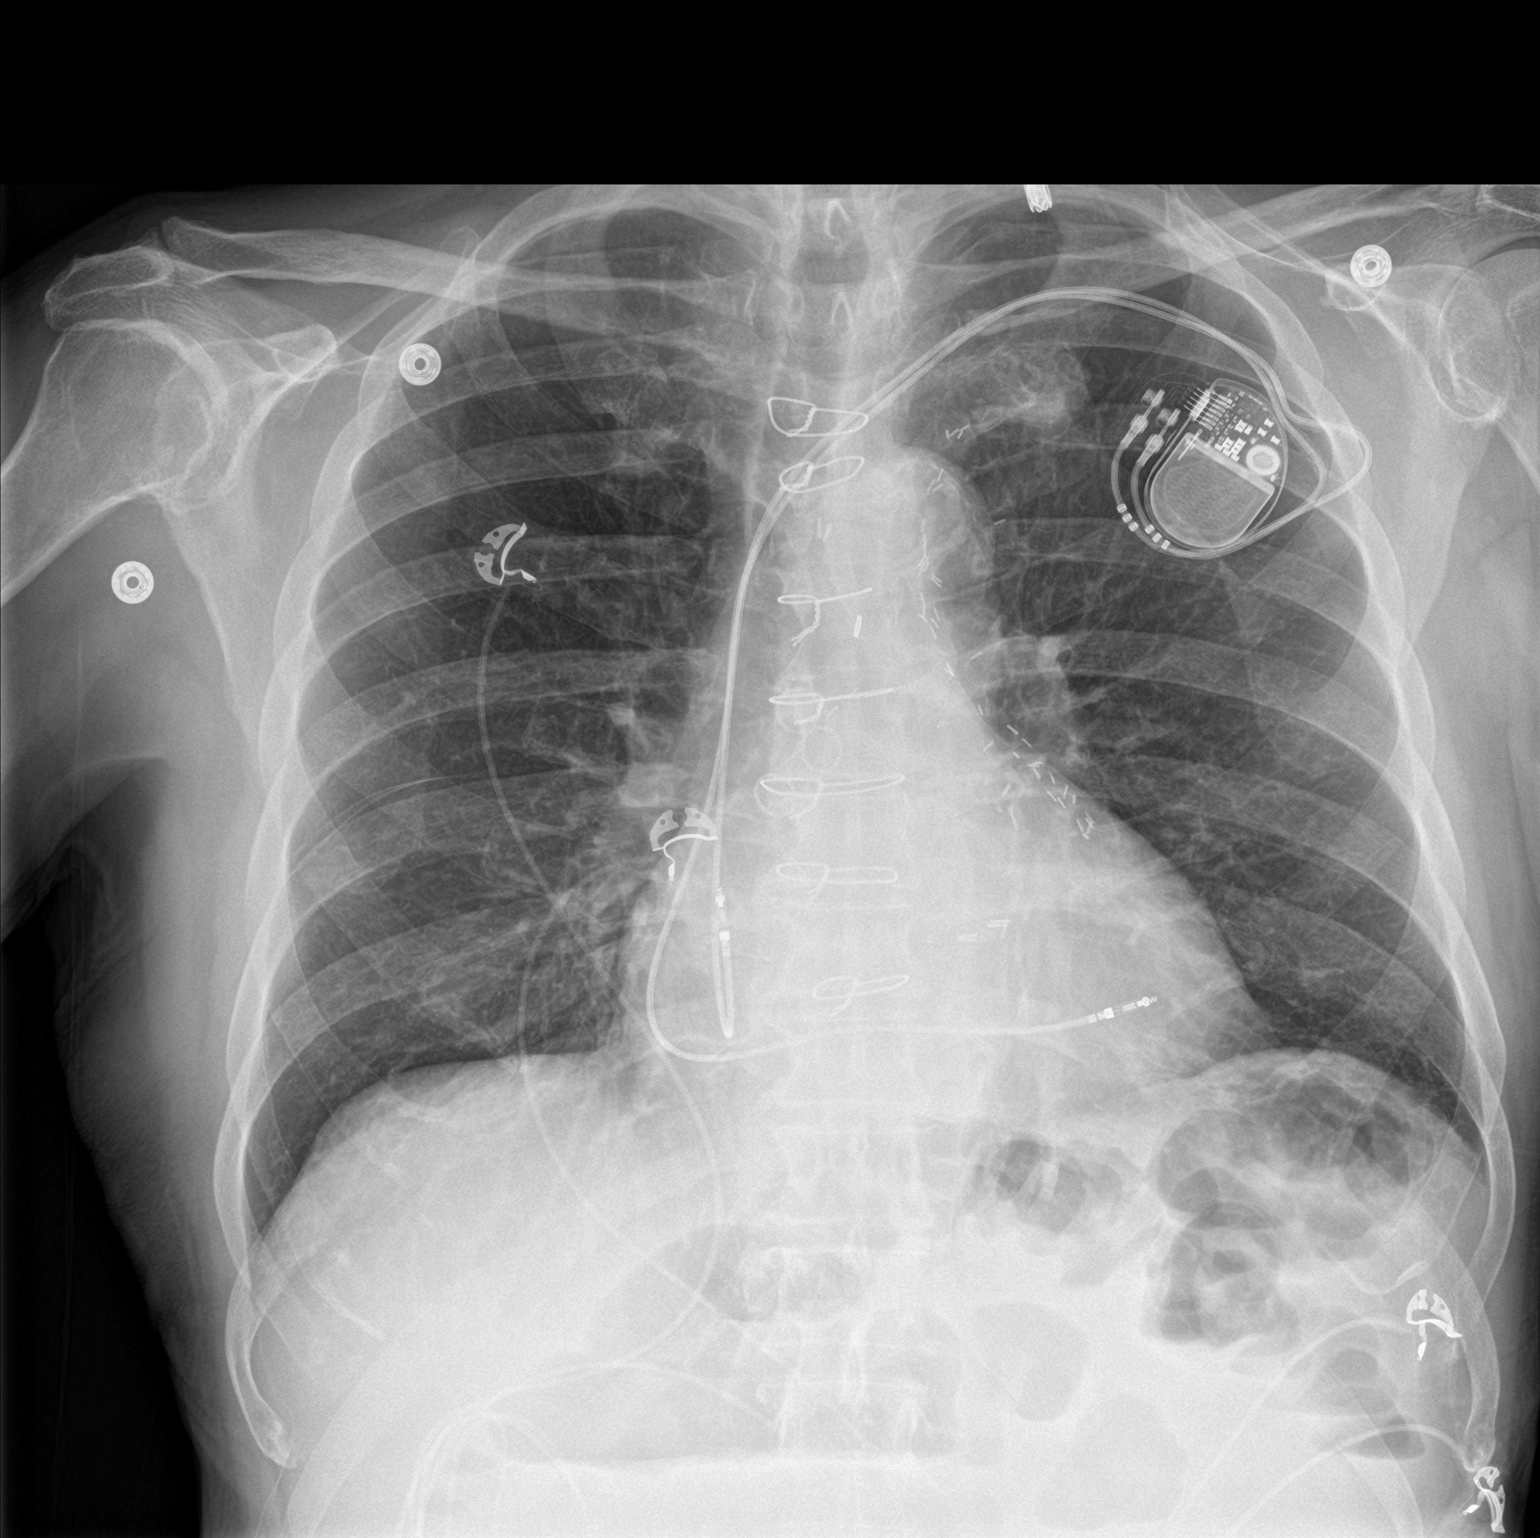

[chest lat]
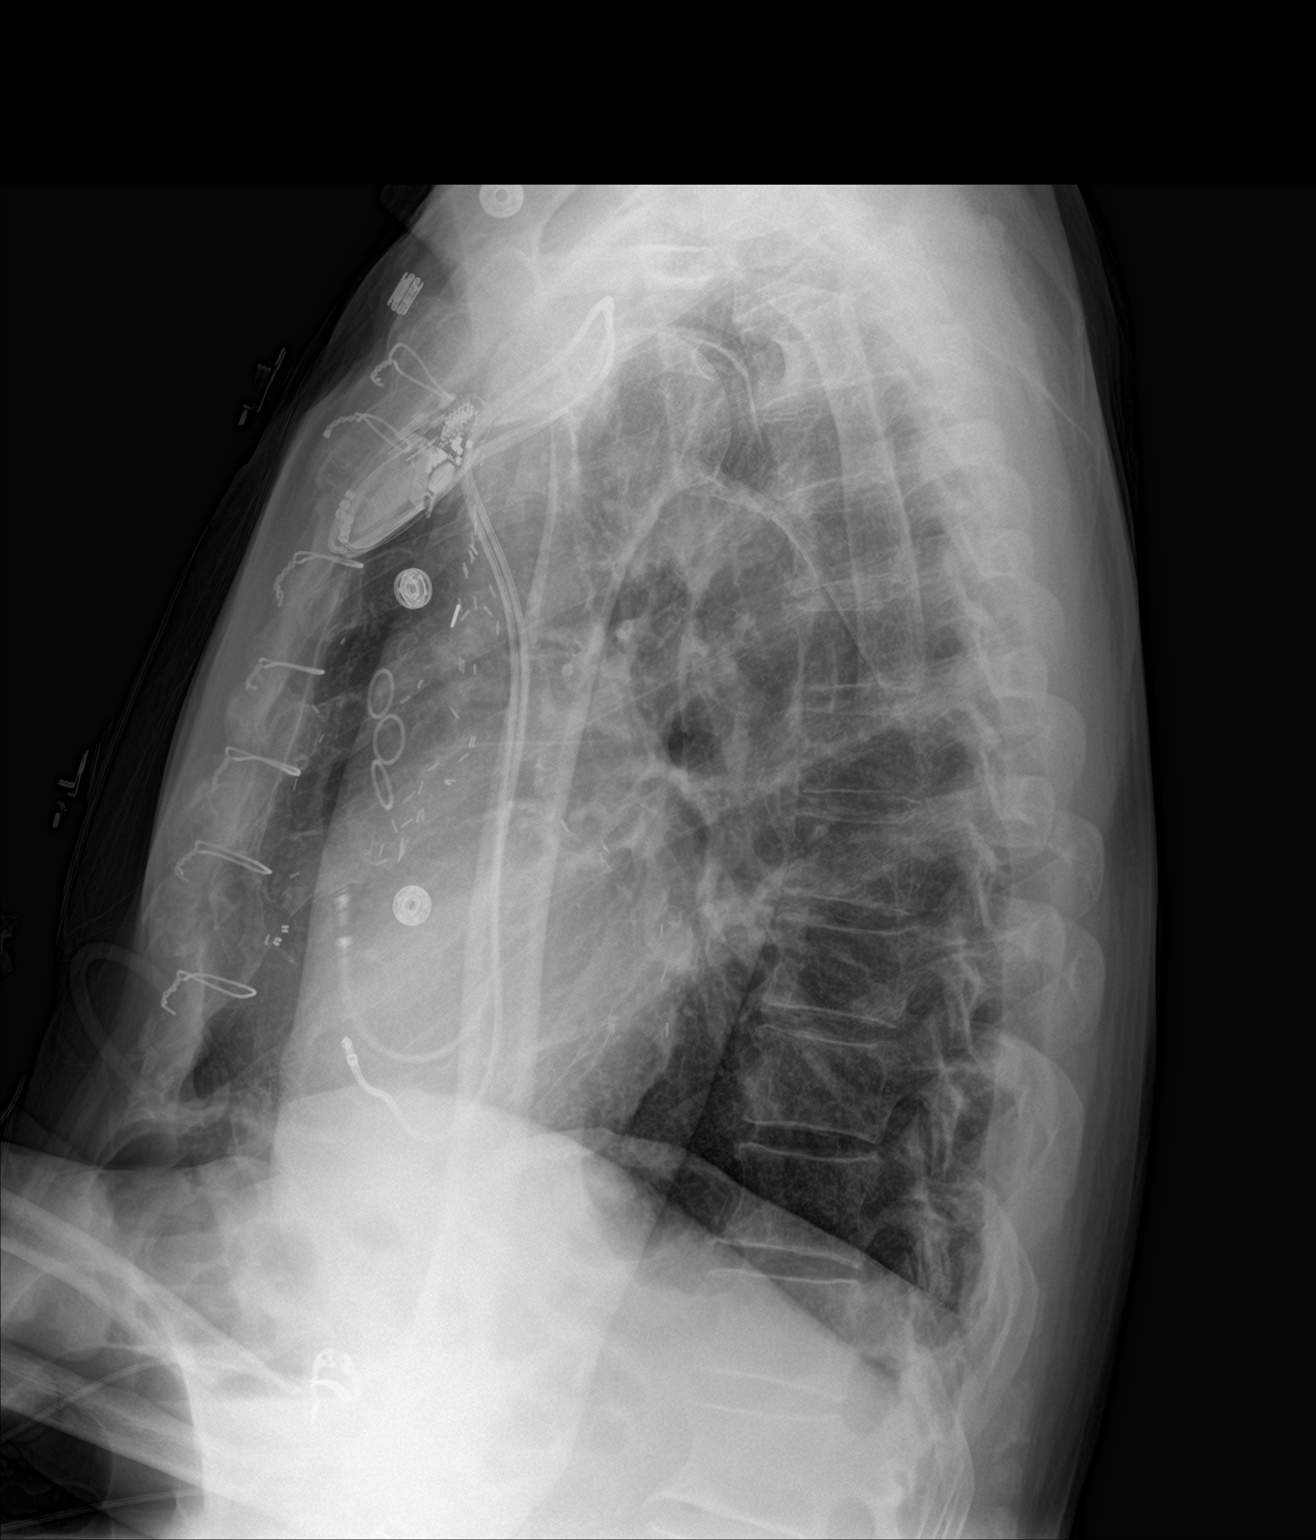

[2 of 2 positions shown; findings below may reference images not displayed]

FINDINGS: Postoperative changes in the mediastinum. There has been interval
placement of a cardiac pacemaker with lead tips projected over the
right atrium and right ventricle. No pneumothorax. Lungs are clear.
No airspace disease or consolidation. No blunting of costophrenic
angles. Calcification of the aorta.
IMPRESSION: 1. New placement of a cardiac pacemaker with lead tips over the RA
and RV regions. No pneumothorax.
2. No evidence of active pulmonary disease.
3. Aortic atherosclerosis

## 2019-11-01 ENCOUNTER — Ambulatory Visit (INDEPENDENT_AMBULATORY_CARE_PROVIDER_SITE_OTHER): Payer: Medicare Other | Admitting: *Deleted

## 2019-11-01 ENCOUNTER — Telehealth: Payer: Self-pay | Admitting: Emergency Medicine

## 2019-11-01 DIAGNOSIS — I441 Atrioventricular block, second degree: Secondary | ICD-10-CM

## 2019-11-01 LAB — CUP PACEART REMOTE DEVICE CHECK
Battery Remaining Longevity: 106 mo
Battery Remaining Percentage: 95.5 %
Battery Voltage: 2.99 V
Brady Statistic AP VP Percent: 35 %
Brady Statistic AP VS Percent: 1 %
Brady Statistic AS VP Percent: 62 %
Brady Statistic AS VS Percent: 1.9 %
Brady Statistic RA Percent Paced: 1.5 %
Brady Statistic RV Percent Paced: 97 %
Date Time Interrogation Session: 20210706021756
Implantable Lead Implant Date: 20190402
Implantable Lead Implant Date: 20190402
Implantable Lead Location: 753859
Implantable Lead Location: 753860
Implantable Pulse Generator Implant Date: 20190402
Lead Channel Impedance Value: 350 Ohm
Lead Channel Impedance Value: 490 Ohm
Lead Channel Pacing Threshold Amplitude: 0.5 V
Lead Channel Pacing Threshold Amplitude: 0.75 V
Lead Channel Pacing Threshold Pulse Width: 0.5 ms
Lead Channel Pacing Threshold Pulse Width: 0.5 ms
Lead Channel Sensing Intrinsic Amplitude: 12 mV
Lead Channel Sensing Intrinsic Amplitude: 2 mV
Lead Channel Setting Pacing Amplitude: 2 V
Lead Channel Setting Pacing Amplitude: 2.5 V
Lead Channel Setting Pacing Pulse Width: 0.5 ms
Lead Channel Setting Sensing Sensitivity: 2 mV
Pulse Gen Model: 2272
Pulse Gen Serial Number: 9003454

## 2019-11-01 NOTE — Telephone Encounter (Signed)
Confirmed that patient is taking Eliquis 2.5 mg BID.

## 2019-11-02 ENCOUNTER — Telehealth: Payer: Self-pay | Admitting: Internal Medicine

## 2019-11-02 NOTE — Telephone Encounter (Signed)
Pt c/o medication issue:  1. Name of Medication: apixaban (ELIQUIS) 2.5 MG TABS tablet  2. How are you currently taking this medication (dosage and times per day)? 2.5 mg 2x daily  3. Are you having a reaction (difficulty breathing--STAT)? No   4. What is your medication issue? Pt needs to have a tooth pulled and needs to know if he needs to hold his medication prior to the tooth extraction. If he does need to hold the medication he will need to know for how long.   The pt does not have an appointment scheduled with his dentist yet. He would like to know about the medication management before scheduling the appointment. Please advise

## 2019-11-02 NOTE — Progress Notes (Signed)
Remote pacemaker transmission.   

## 2019-11-03 NOTE — Telephone Encounter (Signed)
Patient called about tooth extraction.  Patient having one tooth extracted.  Advised to continue Eliquis as normal.

## 2019-11-04 ENCOUNTER — Telehealth: Payer: Self-pay

## 2019-11-04 DIAGNOSIS — K580 Irritable bowel syndrome with diarrhea: Secondary | ICD-10-CM

## 2019-11-04 MED ORDER — DICYCLOMINE HCL 10 MG PO CAPS: 10.0000 mg | ORAL_CAPSULE | Freq: Three times a day (TID) | ORAL | 0 refills | Status: DC

## 2019-11-04 NOTE — Telephone Encounter (Signed)
Had to reschedule appt - provider out of office - appt rescheduled 7/29 with Dr. Raoul Pitch  Patient called back.  He said he didn't have enough meds to last him until 7/29  dicyclomine (BENTYL) 10 MG capsule [[id number]]  He said he has been taking this med for years.  [facility]

## 2019-11-04 NOTE — Telephone Encounter (Signed)
Refill was sent to [place] to last until pts upcoming appt

## 2019-11-07 ENCOUNTER — Ambulatory Visit: Payer: Medicare Other | Admitting: Family Medicine

## 2019-11-08 DIAGNOSIS — Z5111 Encounter for antineoplastic chemotherapy: Secondary | ICD-10-CM | POA: Diagnosis not present

## 2019-11-08 DIAGNOSIS — C678 Malignant neoplasm of overlapping sites of bladder: Secondary | ICD-10-CM | POA: Diagnosis not present

## 2019-11-24 ENCOUNTER — Ambulatory Visit (INDEPENDENT_AMBULATORY_CARE_PROVIDER_SITE_OTHER): Payer: Medicare Other | Admitting: Family Medicine

## 2019-11-24 ENCOUNTER — Encounter: Payer: Self-pay | Admitting: Family Medicine

## 2019-11-24 ENCOUNTER — Other Ambulatory Visit: Payer: Self-pay

## 2019-11-24 VITALS — BP 169/78 | HR 70 | Temp 98.1°F | Resp 16 | Ht 68.0 in | Wt 153.4 lb

## 2019-11-24 DIAGNOSIS — E7800 Pure hypercholesterolemia, unspecified: Secondary | ICD-10-CM

## 2019-11-24 DIAGNOSIS — N1832 Chronic kidney disease, stage 3b: Secondary | ICD-10-CM

## 2019-11-24 DIAGNOSIS — I25708 Atherosclerosis of coronary artery bypass graft(s), unspecified, with other forms of angina pectoris: Secondary | ICD-10-CM

## 2019-11-24 DIAGNOSIS — I480 Paroxysmal atrial fibrillation: Secondary | ICD-10-CM | POA: Diagnosis not present

## 2019-11-24 DIAGNOSIS — R7303 Prediabetes: Secondary | ICD-10-CM

## 2019-11-24 DIAGNOSIS — E559 Vitamin D deficiency, unspecified: Secondary | ICD-10-CM | POA: Diagnosis not present

## 2019-11-24 DIAGNOSIS — C673 Malignant neoplasm of anterior wall of bladder: Secondary | ICD-10-CM

## 2019-11-24 DIAGNOSIS — I10 Essential (primary) hypertension: Secondary | ICD-10-CM | POA: Diagnosis not present

## 2019-11-24 DIAGNOSIS — Z7901 Long term (current) use of anticoagulants: Secondary | ICD-10-CM

## 2019-11-24 DIAGNOSIS — E7849 Other hyperlipidemia: Secondary | ICD-10-CM

## 2019-11-24 DIAGNOSIS — M109 Gout, unspecified: Secondary | ICD-10-CM | POA: Diagnosis not present

## 2019-11-24 DIAGNOSIS — K580 Irritable bowel syndrome with diarrhea: Secondary | ICD-10-CM | POA: Diagnosis not present

## 2019-11-24 DIAGNOSIS — I5032 Chronic diastolic (congestive) heart failure: Secondary | ICD-10-CM | POA: Diagnosis not present

## 2019-11-24 MED ORDER — POTASSIUM CHLORIDE ER 10 MEQ PO TBCR: 10.0000 meq | EXTENDED_RELEASE_TABLET | Freq: Every day | ORAL | 3 refills | Status: DC

## 2019-11-24 MED ORDER — FUROSEMIDE 20 MG PO TABS: 20.0000 mg | ORAL_TABLET | ORAL | 3 refills | Status: DC

## 2019-11-24 MED ORDER — DICYCLOMINE HCL 10 MG PO CAPS: 10.0000 mg | ORAL_CAPSULE | Freq: Three times a day (TID) | ORAL | 1 refills | Status: DC
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
## 2019-11-24 NOTE — Patient Instructions (Signed)
Great to see you today.  I have refilled the medications we take of for you.   Follow up in 5.5 months, unless needed sooner.

## 2019-11-25 ENCOUNTER — Telehealth: Payer: Self-pay | Admitting: Family Medicine

## 2019-11-25 LAB — HEMOGLOBIN A1C
Hgb A1c MFr Bld: 5.9 % of total Hgb — ABNORMAL HIGH (ref <5.7)
Mean Plasma Glucose: 123 (calc)
eAG (mmol/L): 6.8 (calc)

## 2019-11-25 LAB — LIPID PANEL
Cholesterol: 125 mg/dL (ref <200)
HDL: 57 mg/dL (ref >=40)
LDL Cholesterol (Calc): 54 mg/dL (calc)
Non-HDL Cholesterol (Calc): 68 mg/dL (calc) (ref <130)
Total CHOL/HDL Ratio: 2.2 (calc) (ref <5.0)
Triglycerides: 67 mg/dL (ref <150)

## 2019-11-25 LAB — TSH: TSH: 3.85 mIU/L (ref 0.40–4.50)

## 2019-11-25 LAB — PTH, INTACT AND CALCIUM
Calcium: 9.3 mg/dL (ref 8.6–10.3)
PTH: 55 pg/mL (ref 14–64)

## 2019-11-25 LAB — VITAMIN D 25 HYDROXY (VIT D DEFICIENCY, FRACTURES): Vit D, 25-Hydroxy: 27 ng/mL — ABNORMAL LOW (ref 30–100)

## 2019-11-25 MED ORDER — TETANUS-DIPHTH-ACELL PERTUSSIS 5-2.5-18.5 LF-MCG/0.5 IM SUSP: 0.5000 mL | Freq: Once | INTRAMUSCULAR | 0 refills | Status: AC

## 2019-11-25 NOTE — Telephone Encounter (Signed)
Pt was called and was unable to leave VM, pt did not answer

## 2019-11-25 NOTE — Telephone Encounter (Signed)
Please call patient -thyroid function is normal -Diabetes screening/A1c is normal at 5.9 (this is great) -Vitamin D is a little low at 27-his med list states he takes 5000 units a day.  Continue daily vitamin D.  Vitamin D is best absorbed when taken with a meal. -Cholesterol panel looks great   1 lab is still pending, this likely will not be resulted till next week.  We will call him with recommendations once those results are received.   Lastly, his tetanus shot is due.  I printed out a prescription that we can mail to him in the event he would like to have his tetanus shot.  Please explain to him with Medicare tetanus is completed at his pharmacy with a prescription. Please remind him to let us know if he has his tetanus shot administered.  Prescription placed on RB workstation

## 2019-11-26 ENCOUNTER — Encounter: Payer: Self-pay | Admitting: Family Medicine

## 2019-11-26 DIAGNOSIS — D6869 Other thrombophilia: Secondary | ICD-10-CM | POA: Insufficient documentation

## 2019-11-26 MED ORDER — DICYCLOMINE HCL 10 MG PO CAPS: 10.0000 mg | ORAL_CAPSULE | Freq: Three times a day (TID) | ORAL | 5 refills | Status: DC

## 2019-11-26 MED ORDER — ATORVASTATIN CALCIUM 20 MG PO TABS: 20.0000 mg | ORAL_TABLET | Freq: Every day | ORAL | 3 refills | Status: DC

## 2019-11-28 NOTE — Telephone Encounter (Signed)
Pt returning call to Wells Guiles, please call back when available.

## 2019-11-28 NOTE — Telephone Encounter (Signed)
Patient advised of all lab results and provider recommendations.  Patient is aware of need for Tdap.  [facility] would [facility] [place] [facility] [place] so he can go get it.  Rx will be faxed over today.

## 2019-11-29 ENCOUNTER — Other Ambulatory Visit: Payer: Self-pay | Admitting: Family Medicine

## 2019-11-29 DIAGNOSIS — E7800 Pure hypercholesterolemia, unspecified: Secondary | ICD-10-CM

## 2019-11-30 DIAGNOSIS — B079 Viral wart, unspecified: Secondary | ICD-10-CM | POA: Diagnosis not present

## 2019-11-30 DIAGNOSIS — E1151 Type 2 diabetes mellitus with diabetic peripheral angiopathy without gangrene: Secondary | ICD-10-CM | POA: Diagnosis not present

## 2019-11-30 DIAGNOSIS — I739 Peripheral vascular disease, unspecified: Secondary | ICD-10-CM | POA: Diagnosis not present

## 2019-11-30 DIAGNOSIS — L84 Corns and callosities: Secondary | ICD-10-CM | POA: Diagnosis not present

## 2019-11-30 DIAGNOSIS — L603 Nail dystrophy: Secondary | ICD-10-CM | POA: Diagnosis not present

## 2019-12-02 ENCOUNTER — Inpatient Hospital Stay: Payer: Medicare Other

## 2019-12-02 ENCOUNTER — Encounter: Payer: Self-pay | Admitting: Hematology & Oncology

## 2019-12-02 ENCOUNTER — Other Ambulatory Visit: Payer: Self-pay

## 2019-12-02 ENCOUNTER — Inpatient Hospital Stay: Payer: Medicare Other | Attending: Hematology & Oncology | Admitting: Hematology & Oncology

## 2019-12-02 VITALS — BP 168/60 | HR 77 | Temp 97.6°F | Resp 16 | Ht 68.0 in | Wt 151.8 lb

## 2019-12-02 DIAGNOSIS — C679 Malignant neoplasm of bladder, unspecified: Secondary | ICD-10-CM | POA: Diagnosis not present

## 2019-12-02 DIAGNOSIS — Z79899 Other long term (current) drug therapy: Secondary | ICD-10-CM | POA: Insufficient documentation

## 2019-12-02 DIAGNOSIS — D500 Iron deficiency anemia secondary to blood loss (chronic): Secondary | ICD-10-CM | POA: Diagnosis not present

## 2019-12-02 DIAGNOSIS — D631 Anemia in chronic kidney disease: Secondary | ICD-10-CM | POA: Diagnosis not present

## 2019-12-02 DIAGNOSIS — D509 Iron deficiency anemia, unspecified: Secondary | ICD-10-CM | POA: Diagnosis not present

## 2019-12-02 DIAGNOSIS — I25708 Atherosclerosis of coronary artery bypass graft(s), unspecified, with other forms of angina pectoris: Secondary | ICD-10-CM

## 2019-12-02 DIAGNOSIS — N189 Chronic kidney disease, unspecified: Secondary | ICD-10-CM | POA: Diagnosis not present

## 2019-12-02 DIAGNOSIS — Z7901 Long term (current) use of anticoagulants: Secondary | ICD-10-CM | POA: Diagnosis not present

## 2019-12-02 LAB — CBC WITH DIFFERENTIAL (CANCER CENTER ONLY)
Abs Immature Granulocytes: 0.09 10*3/uL — ABNORMAL HIGH (ref 0.00–0.07)
Basophils Absolute: 0 10*3/uL (ref 0.0–0.1)
Basophils Relative: 0 %
Eosinophils Absolute: 0.1 10*3/uL (ref 0.0–0.5)
Eosinophils Relative: 2 %
HCT: 33.8 % — ABNORMAL LOW (ref 39.0–52.0)
Hemoglobin: 11.2 g/dL — ABNORMAL LOW (ref 13.0–17.0)
Immature Granulocytes: 1 %
Lymphocytes Relative: 29 %
Lymphs Abs: 2.2 10*3/uL (ref 0.7–4.0)
MCH: 31.1 pg (ref 26.0–34.0)
MCHC: 33.1 g/dL (ref 30.0–36.0)
MCV: 93.9 fL (ref 80.0–100.0)
Monocytes Absolute: 0.7 10*3/uL (ref 0.1–1.0)
Monocytes Relative: 9 %
Neutro Abs: 4.5 10*3/uL (ref 1.7–7.7)
Neutrophils Relative %: 59 %
Platelet Count: 153 10*3/uL (ref 150–400)
RBC: 3.6 MIL/uL — ABNORMAL LOW (ref 4.22–5.81)
RDW: 14.6 % (ref 11.5–15.5)
WBC Count: 7.6 10*3/uL (ref 4.0–10.5)
nRBC: 0 % (ref 0.0–0.2)

## 2019-12-02 LAB — CMP (CANCER CENTER ONLY)
ALT: 22 U/L (ref 0–44)
AST: 23 U/L (ref 15–41)
Albumin: 4.3 g/dL (ref 3.5–5.0)
Alkaline Phosphatase: 57 U/L (ref 38–126)
Anion gap: 7 (ref 5–15)
BUN: 33 mg/dL — ABNORMAL HIGH (ref 8–23)
CO2: 24 mmol/L (ref 22–32)
Calcium: 9.5 mg/dL (ref 8.9–10.3)
Chloride: 108 mmol/L (ref 98–111)
Creatinine: 1.55 mg/dL — ABNORMAL HIGH (ref 0.61–1.24)
GFR, Est AFR Am: 45 mL/min — ABNORMAL LOW (ref >60)
GFR, Estimated: 39 mL/min — ABNORMAL LOW (ref >60)
Glucose, Bld: 109 mg/dL — ABNORMAL HIGH (ref 70–99)
Potassium: 5.2 mmol/L — ABNORMAL HIGH (ref 3.5–5.1)
Sodium: 139 mmol/L (ref 135–145)
Total Bilirubin: 0.8 mg/dL (ref 0.3–1.2)
Total Protein: 7 g/dL (ref 6.5–8.1)

## 2019-12-02 LAB — RETICULOCYTES
Immature Retic Fract: 9.9 % (ref 2.3–15.9)
RBC.: 3.59 MIL/uL — ABNORMAL LOW (ref 4.22–5.81)
Retic Count, Absolute: 72.2 10*3/uL (ref 19.0–186.0)
Retic Ct Pct: 2 % (ref 0.4–3.1)

## 2019-12-02 NOTE — Progress Notes (Signed)
Hematology and Oncology Follow Up Visit  [name] [id number] 1929/04/16 86 y.o. 12/02/2019   Principle Diagnosis:  Erythropoietin deficiency anemia Iron deficiency anemia  Current Therapy:   IV iron as indicated -- feraheme given on 07/27/2019 Aranesp 300 g sq prn hemoglobin < 11 - last dose given on  07/18/2019   Interim History:  [name] is here today for follow-up.  Overall, he is doing fairly well.  He really has had no issues with respect to his blood.  Thankfully his renal function is improving.  He not had to give him Aranesp now for probably for 5 months.  His iron studies we saw him back in January showed a ferritin of 505 with iron saturation of 31%.   He has done well with respect to him having superficial bladder cancer.  He did receive intravesicular BCG.  He has had no bleeding.  He has had no pain.  Has had no cough or shortness of breath.  He has had a little bit of leg swelling but this is been chronic.   Overall, his performance status is ECOG 2.  Medications:  Allergies as of 12/02/2019      Reactions   Penicillins Hives   Has patient had a PCN reaction causing immediate rash, facial/tongue/throat swelling, SOB or lightheadedness with hypotension: No Has patient had a PCN reaction causing severe rash involving mucus membranes or skin necrosis: Yes Has patient had a PCN reaction that required hospitalization: No Has patient had a PCN reaction occurring within the last 10 years: No If all of the above answers are "NO", then may proceed with Cephalosporin use.   Vancomycin Other (See Comments)   Edema and myalgia      Medication List       Accurate as of December 02, 2019  4:01 PM. If you have any questions, ask your nurse or doctor.        Accu-Chek FastClix Lancets Misc Check fasting blood sugar once daily   Accu-Chek Guide test strip Generic drug: glucose blood Check fasting blood sugar once daily   allopurinol 300 MG tablet Commonly  known as: ZYLOPRIM Take 0.5 tablets (150 mg total) by mouth daily.   amLODipine 5 MG tablet Commonly known as: NORVASC TAKE 1 TABLET DAILY   atorvastatin 20 MG tablet Commonly known as: LIPITOR TAKE 1 TABLET AT BEDTIME   blood glucose meter kit and supplies Dispense based on patient and insurance preference. Use up to four times daily as directed. R73.03   carvedilol 6.25 MG tablet Commonly known as: COREG Take 1 tablet (6.25 mg total) by mouth 2 (two) times daily with a meal.   Cholecalciferol 125 MCG (5000 UT) capsule Take 5,000 Units by mouth daily.   CHROMIUM GTF PO Take 1 capsule by mouth every evening.   Cinnamon 500 MG Tabs Take 2 tablets by mouth at bedtime.   Co Q 10 100 MG Caps Take 100 mg by mouth daily.   dicyclomine 10 MG capsule Commonly known as: BENTYL Take 1 capsule (10 mg total) by mouth 4 (four) times daily -  before meals and at bedtime.   diphenoxylate-atropine 2.5-0.025 MG tablet Commonly known as: LOMOTIL 1 tablet. Pt takes PRN   Eliquis 2.5 MG Tabs tablet Generic drug: apixaban Take 2.5 mg by mouth 2 (two) times daily.   finasteride 5 MG tablet Commonly known as: PROSCAR Take 5 mg by mouth daily.   fish oil-omega-3 fatty acids 1000 MG capsule Take 1 g by  mouth daily.   furosemide 20 MG tablet Commonly known as: LASIX Take 1 tablet (20 mg total) by mouth as directed.   latanoprost 0.005 % ophthalmic solution Commonly known as: XALATAN Place 1 drop into both eyes at bedtime.   potassium chloride 10 MEQ tablet Commonly known as: KLOR-CON Take 1 tablet (10 mEq total) by mouth daily.   PRESERVISION AREDS PO Take 1 tablet by mouth 2 (two) times daily.   PROBIOTIC PO Take 1 tablet by mouth daily.   tamsulosin 0.4 MG Caps capsule Commonly known as: FLOMAX Take 0.4 mg by mouth.   timolol 0.5 % ophthalmic solution Commonly known as: TIMOPTIC Place 1 drop into both eyes 2 (two) times daily.   VANADYL SULFATE PO Take 1 capsule by  mouth every evening.   zinc gluconate 50 MG tablet Take 50 mg by mouth daily.       Allergies:  Allergies  Allergen Reactions  . Penicillins Hives    Has patient had a PCN reaction causing immediate rash, facial/tongue/throat swelling, SOB or lightheadedness with hypotension: No Has patient had a PCN reaction causing severe rash involving mucus membranes or skin necrosis: Yes Has patient had a PCN reaction that required hospitalization: No Has patient had a PCN reaction occurring within the last 10 years: No If all of the above answers are "NO", then may proceed with Cephalosporin use.   . Vancomycin Other (See Comments)    Edema and myalgia    Past Medical History, Surgical history, Social history, and Family History were reviewed and updated.  Review of Systems: Review of Systems  Constitutional: Negative.   HENT: Negative.   Eyes: Negative.   Respiratory: Negative.   Cardiovascular: Positive for palpitations.  Gastrointestinal: Negative.   Genitourinary: Negative.   Musculoskeletal: Negative.   Skin: Negative.   Neurological: Negative.   Endo/Heme/Allergies: Negative.   Psychiatric/Behavioral: Negative.      Physical Exam:  height is '5\' 8"'  (1.727 m) and weight is 151 lb 12.8 oz (68.9 kg). His oral temperature is 97.6 F (36.4 C). His blood pressure is 168/60 (abnormal) and his pulse is 77. His respiration is 16 and oxygen saturation is 100%.   Wt Readings from Last 3 Encounters:  12/02/19 151 lb 12.8 oz (68.9 kg)  11/24/19 153 lb 6 oz (69.6 kg)  10/21/19 152 lb (68.9 kg)    Physical Exam Vitals reviewed.  HENT:     Head: Normocephalic and atraumatic.  Eyes:     Pupils: Pupils are equal, round, and reactive to light.  Cardiovascular:     Rate and Rhythm: Normal rate and regular rhythm.     Heart sounds: Normal heart sounds.  Pulmonary:     Effort: Pulmonary effort is normal.     Breath sounds: Normal breath sounds.  Abdominal:     General: Bowel  sounds are normal.     Palpations: Abdomen is soft.  Musculoskeletal:        General: No tenderness or deformity. Normal range of motion.     Cervical back: Normal range of motion.  Lymphadenopathy:     Cervical: No cervical adenopathy.  Skin:    General: Skin is warm and dry.     Findings: No erythema or rash.  Neurological:     Mental Status: He is alert and oriented to person, place, and time.  Psychiatric:        Behavior: Behavior normal.        Thought Content: Thought content normal.  Judgment: Judgment normal.      Lab Results  Component Value Date   WBC 7.6 12/02/2019   HGB 11.2 (L) 12/02/2019   HCT 33.8 (L) 12/02/2019   MCV 93.9 12/02/2019   PLT 153 12/02/2019   Lab Results  Component Value Date   FERRITIN 505 (H) 10/21/2019   IRON 79 10/21/2019   TIBC 251 10/21/2019   UIBC 172 10/21/2019   IRONPCTSAT 31 10/21/2019   Lab Results  Component Value Date   RETICCTPCT 2.0 12/02/2019   RBC 3.60 (L) 12/02/2019   RBC 3.59 (L) 12/02/2019   No results found for: KPAFRELGTCHN, LAMBDASER, KAPLAMBRATIO No results found for: Kandis Cocking, IGMSERUM Lab Results  Component Value Date   TOTALPROTELP 6.0 (L) 12/26/2015   ALBUMINELP 3.4 (L) 12/26/2015   A1GS 0.4 (H) 12/26/2015   A2GS 0.8 12/26/2015   BETS 0.4 12/26/2015   BETA2SER 0.3 12/26/2015   GAMS 0.8 12/26/2015   MSPIKE Not Observed 01/29/2016   SPEI SEE NOTE 12/26/2015     Chemistry      Component Value Date/Time   NA 139 12/02/2019 1434   NA 141 08/11/2017 0924   NA 145 04/09/2017 0905   NA 140 03/28/2016 0853   K 5.2 (H) 12/02/2019 1434   K 4.5 04/09/2017 0905   K 4.4 03/28/2016 0853   CL 108 12/02/2019 1434   CL 105 04/09/2017 0905   CO2 24 12/02/2019 1434   CO2 27 04/09/2017 0905   CO2 19 (L) 03/28/2016 0853   BUN 33 (H) 12/02/2019 1434   BUN 27 08/11/2017 0924   BUN 33 (H) 04/09/2017 0905   BUN 34.4 (H) 03/28/2016 0853   CREATININE 1.55 (H) 12/02/2019 1434   CREATININE 1.72 (H)  08/18/2017 1543   CREATININE 1.7 (H) 03/28/2016 0853   GLU 113 07/24/2016 0000      Component Value Date/Time   CALCIUM 9.5 12/02/2019 1434   CALCIUM 9.2 04/09/2017 0905   CALCIUM 9.2 03/28/2016 0853   ALKPHOS 57 12/02/2019 1434   ALKPHOS 69 04/09/2017 0905   ALKPHOS 85 03/28/2016 0853   AST 23 12/02/2019 1434   AST 23 03/28/2016 0853   ALT 22 12/02/2019 1434   ALT 27 04/09/2017 0905   ALT 37 03/28/2016 0853   BILITOT 0.8 12/02/2019 1434   BILITOT 0.79 03/28/2016 0853      Impression and Plan: [name] is a very pleasant 86 yo caucasian gentleman with both erythropoietin deficiency and iron deficiency anemia.   I would be surprised if his iron level is low this time.  However, given that he does have the bladder cancer, there may be some microscopic bleeding.  He does not need Aranesp.  If there is any problems, I see him in church on Sundays.  He was less me know if there is anything that we can do for him..  We will plan to get her back now in another 2 months.  Hopefully, his hemoglobin will maintain itself.  [name], MD 8/6/20214:01 PM

## 2019-12-05 ENCOUNTER — Encounter: Payer: Self-pay | Admitting: *Deleted

## 2019-12-05 LAB — FERRITIN: Ferritin: 528 ng/mL — ABNORMAL HIGH (ref 24–336)

## 2019-12-05 LAB — IRON AND TIBC
Iron: 83 ug/dL (ref 42–163)
Saturation Ratios: 30 % (ref 20–55)
TIBC: 276 ug/dL (ref 202–409)
UIBC: 194 ug/dL (ref 117–376)

## 2019-12-15 DIAGNOSIS — B079 Viral wart, unspecified: Secondary | ICD-10-CM | POA: Diagnosis not present

## 2019-12-22 DIAGNOSIS — D692 Other nonthrombocytopenic purpura: Secondary | ICD-10-CM | POA: Diagnosis not present

## 2019-12-22 DIAGNOSIS — Z85828 Personal history of other malignant neoplasm of skin: Secondary | ICD-10-CM | POA: Diagnosis not present

## 2019-12-22 DIAGNOSIS — I8312 Varicose veins of left lower extremity with inflammation: Secondary | ICD-10-CM | POA: Diagnosis not present

## 2019-12-22 DIAGNOSIS — D1801 Hemangioma of skin and subcutaneous tissue: Secondary | ICD-10-CM | POA: Diagnosis not present

## 2019-12-22 DIAGNOSIS — L821 Other seborrheic keratosis: Secondary | ICD-10-CM | POA: Diagnosis not present

## 2019-12-22 DIAGNOSIS — L570 Actinic keratosis: Secondary | ICD-10-CM | POA: Diagnosis not present

## 2019-12-22 DIAGNOSIS — I8311 Varicose veins of right lower extremity with inflammation: Secondary | ICD-10-CM | POA: Diagnosis not present

## 2019-12-22 DIAGNOSIS — D485 Neoplasm of uncertain behavior of skin: Secondary | ICD-10-CM | POA: Diagnosis not present

## 2019-12-22 DIAGNOSIS — I872 Venous insufficiency (chronic) (peripheral): Secondary | ICD-10-CM | POA: Diagnosis not present

## 2019-12-22 DIAGNOSIS — L820 Inflamed seborrheic keratosis: Secondary | ICD-10-CM | POA: Diagnosis not present

## 2020-01-06 ENCOUNTER — Telehealth: Payer: Self-pay | Admitting: Internal Medicine

## 2020-01-06 NOTE — Telephone Encounter (Signed)
Patient reports that he fell 2 weeks ago after tripping over his trash cans. Patient reports bruising in left axillary region and left shoulder. He denies syncope or dizziness prior to fall  And no LOC before or after fall. Patient assisted with remote transmission: device function WNL, episodes of AF which are consistent with patient's past transmissions, + Eliquis. Patient reassured device is functioning WNL. Questions answered.

## 2020-01-06 NOTE — Telephone Encounter (Signed)
    Pt would like to speak with Dr. Jackalyn Lombard nurse. He said its about his pacemaker

## 2020-01-09 DIAGNOSIS — R350 Frequency of micturition: Secondary | ICD-10-CM | POA: Diagnosis not present

## 2020-01-09 DIAGNOSIS — C678 Malignant neoplasm of overlapping sites of bladder: Secondary | ICD-10-CM | POA: Diagnosis not present

## 2020-01-09 DIAGNOSIS — N401 Enlarged prostate with lower urinary tract symptoms: Secondary | ICD-10-CM | POA: Diagnosis not present

## 2020-01-24 DIAGNOSIS — C678 Malignant neoplasm of overlapping sites of bladder: Secondary | ICD-10-CM | POA: Diagnosis not present

## 2020-01-24 DIAGNOSIS — Z5111 Encounter for antineoplastic chemotherapy: Secondary | ICD-10-CM | POA: Diagnosis not present

## 2020-01-26 DIAGNOSIS — Z23 Encounter for immunization: Secondary | ICD-10-CM | POA: Diagnosis not present

## 2020-01-30 ENCOUNTER — Ambulatory Visit (INDEPENDENT_AMBULATORY_CARE_PROVIDER_SITE_OTHER): Payer: Medicare Other

## 2020-01-30 DIAGNOSIS — I441 Atrioventricular block, second degree: Secondary | ICD-10-CM | POA: Diagnosis not present

## 2020-01-31 DIAGNOSIS — C678 Malignant neoplasm of overlapping sites of bladder: Secondary | ICD-10-CM | POA: Diagnosis not present

## 2020-01-31 DIAGNOSIS — Z5111 Encounter for antineoplastic chemotherapy: Secondary | ICD-10-CM | POA: Diagnosis not present

## 2020-02-01 LAB — CUP PACEART REMOTE DEVICE CHECK
Battery Remaining Longevity: 106 mo
Battery Remaining Percentage: 95.5 %
Battery Voltage: 2.99 V
Brady Statistic AP VP Percent: 35 %
Brady Statistic AP VS Percent: 1 %
Brady Statistic AS VP Percent: 62 %
Brady Statistic AS VS Percent: 1.9 %
Brady Statistic RA Percent Paced: 1.3 %
Brady Statistic RV Percent Paced: 97 %
Date Time Interrogation Session: 20211005025921
Implantable Lead Implant Date: 20190402
Implantable Lead Implant Date: 20190402
Implantable Lead Location: 753859
Implantable Lead Location: 753860
Implantable Pulse Generator Implant Date: 20190402
Lead Channel Impedance Value: 360 Ohm
Lead Channel Impedance Value: 480 Ohm
Lead Channel Pacing Threshold Amplitude: 0.5 V
Lead Channel Pacing Threshold Amplitude: 0.75 V
Lead Channel Pacing Threshold Pulse Width: 0.5 ms
Lead Channel Pacing Threshold Pulse Width: 0.5 ms
Lead Channel Sensing Intrinsic Amplitude: 12 mV
Lead Channel Sensing Intrinsic Amplitude: 2 mV
Lead Channel Setting Pacing Amplitude: 2 V
Lead Channel Setting Pacing Amplitude: 2.5 V
Lead Channel Setting Pacing Pulse Width: 0.5 ms
Lead Channel Setting Sensing Sensitivity: 2 mV
Pulse Gen Model: 2272
Pulse Gen Serial Number: 9003454

## 2020-02-02 ENCOUNTER — Encounter: Payer: Self-pay | Admitting: Hematology & Oncology

## 2020-02-02 ENCOUNTER — Inpatient Hospital Stay: Payer: Medicare Other

## 2020-02-02 ENCOUNTER — Inpatient Hospital Stay: Payer: Medicare Other | Attending: Hematology & Oncology | Admitting: Hematology & Oncology

## 2020-02-02 ENCOUNTER — Other Ambulatory Visit: Payer: Self-pay

## 2020-02-02 ENCOUNTER — Telehealth: Payer: Self-pay | Admitting: Hematology & Oncology

## 2020-02-02 VITALS — BP 155/61 | HR 74 | Temp 98.5°F | Resp 18 | Wt 154.0 lb

## 2020-02-02 DIAGNOSIS — N189 Chronic kidney disease, unspecified: Secondary | ICD-10-CM | POA: Insufficient documentation

## 2020-02-02 DIAGNOSIS — D508 Other iron deficiency anemias: Secondary | ICD-10-CM

## 2020-02-02 DIAGNOSIS — D631 Anemia in chronic kidney disease: Secondary | ICD-10-CM | POA: Diagnosis not present

## 2020-02-02 DIAGNOSIS — I25708 Atherosclerosis of coronary artery bypass graft(s), unspecified, with other forms of angina pectoris: Secondary | ICD-10-CM | POA: Diagnosis not present

## 2020-02-02 DIAGNOSIS — D500 Iron deficiency anemia secondary to blood loss (chronic): Secondary | ICD-10-CM

## 2020-02-02 DIAGNOSIS — N183 Chronic kidney disease, stage 3 unspecified: Secondary | ICD-10-CM

## 2020-02-02 DIAGNOSIS — D509 Iron deficiency anemia, unspecified: Secondary | ICD-10-CM

## 2020-02-02 LAB — CBC WITH DIFFERENTIAL (CANCER CENTER ONLY)
Abs Immature Granulocytes: 0.04 10*3/uL (ref 0.00–0.07)
Basophils Absolute: 0 10*3/uL (ref 0.0–0.1)
Basophils Relative: 0 %
Eosinophils Absolute: 0.2 10*3/uL (ref 0.0–0.5)
Eosinophils Relative: 2 %
HCT: 32.4 % — ABNORMAL LOW (ref 39.0–52.0)
Hemoglobin: 10.7 g/dL — ABNORMAL LOW (ref 13.0–17.0)
Immature Granulocytes: 1 %
Lymphocytes Relative: 24 %
Lymphs Abs: 1.8 10*3/uL (ref 0.7–4.0)
MCH: 31 pg (ref 26.0–34.0)
MCHC: 33 g/dL (ref 30.0–36.0)
MCV: 93.9 fL (ref 80.0–100.0)
Monocytes Absolute: 0.7 10*3/uL (ref 0.1–1.0)
Monocytes Relative: 10 %
Neutro Abs: 4.7 10*3/uL (ref 1.7–7.7)
Neutrophils Relative %: 63 %
Platelet Count: 156 10*3/uL (ref 150–400)
RBC: 3.45 MIL/uL — ABNORMAL LOW (ref 4.22–5.81)
RDW: 14.1 % (ref 11.5–15.5)
WBC Count: 7.4 10*3/uL (ref 4.0–10.5)
nRBC: 0 % (ref 0.0–0.2)

## 2020-02-02 LAB — CMP (CANCER CENTER ONLY)
ALT: 44 U/L (ref 0–44)
AST: 38 U/L (ref 15–41)
Albumin: 4.1 g/dL (ref 3.5–5.0)
Alkaline Phosphatase: 60 U/L (ref 38–126)
Anion gap: 9 (ref 5–15)
BUN: 36 mg/dL — ABNORMAL HIGH (ref 8–23)
CO2: 26 mmol/L (ref 22–32)
Calcium: 9.4 mg/dL (ref 8.9–10.3)
Chloride: 105 mmol/L (ref 98–111)
Creatinine: 1.63 mg/dL — ABNORMAL HIGH (ref 0.61–1.24)
GFR, Estimated: 37 mL/min — ABNORMAL LOW (ref >60)
Glucose, Bld: 102 mg/dL — ABNORMAL HIGH (ref 70–99)
Potassium: 4.6 mmol/L (ref 3.5–5.1)
Sodium: 140 mmol/L (ref 135–145)
Total Bilirubin: 0.6 mg/dL (ref 0.3–1.2)
Total Protein: 6.6 g/dL (ref 6.5–8.1)

## 2020-02-02 LAB — RETICULOCYTES
Immature Retic Fract: 10.8 % (ref 2.3–15.9)
RBC.: 3.44 MIL/uL — ABNORMAL LOW (ref 4.22–5.81)
Retic Count, Absolute: 59.5 10*3/uL (ref 19.0–186.0)
Retic Ct Pct: 1.7 % (ref 0.4–3.1)

## 2020-02-02 MED ORDER — DARBEPOETIN ALFA 300 MCG/0.6ML IJ SOSY
300.0000 ug | PREFILLED_SYRINGE | Freq: Once | INTRAMUSCULAR | Status: AC
  Administered 2020-02-02: 300 ug via SUBCUTANEOUS

## 2020-02-02 NOTE — Telephone Encounter (Signed)
Appointments scheduled calendar printed & mailed per 10/7 los

## 2020-02-02 NOTE — Patient Instructions (Signed)
Darbepoetin Alfa injection (Aranesp) O que  este medicamento? A ALFADARBEPOETINA ajuda o seu organismo a produzir mais glbulos vermelhos do sangue.  usada para tratar a anemia provocada por doena renal crnica ou quimioterapia. Este medicamento pode ser usado para outros propsitos; em caso de dvidas, pergunte ao seu profissional de sade ou farmacutico. NOMES DE MARCAS COMUNS: Aranesp O que devo dizer a meu profissional de sade antes de tomar este medicamento? Precisam saber se voc tem algum dos seguintes problemas ou estados de sade:  distrbios da coagulao sangunea ou histria pregressa de cogulos (trombose)  tem cncer e no est fazendo quimioterapia  fibrose cstica  doenas cardacas, inclusive dor no peito (angina), insuficincia cardaca ou ataque do corao prvio  hemoglobina de 12 g/dL ou acima  presso alta  carncia de cido flico, ferro ou vitamina B12  convulses  reao estranha ou alergia  alfadarbepoetina,  eritropoetina,  albumina, s protenas de hamster ou ao ltex  reao estranha ou alergia a outros medicamentos, alimentos, corantes ou conservantes  est grvida ou tentando engravidar  est amamentando Como devo usar este medicamento? Este medicamento deve ser injetado por via subcutnea ou intravenosa. Este medicamento costuma ser administrado por um profissional da sade no hospital ou em consultrio. Se este medicamento for administrado em casa, voc ser ensinado a preparar e aplicar o medicamento. Use exatamente como indicado. Tome este medicamento em intervalos regulares. No tome este medicamento com frequncia maior do que a indicada.  muito importante que as agulhas e seringas usadas sejam descartadas em um coletor especial para materiais perfurocortantes. No as coloque na lata de lixo. Se no tiver um coletor para materiais perfurocortantes, pea um a seu farmacutico ou profissional de sade. O farmacutico lhe dar um folheto  informativo especial a cada compra do medicamento. No se esquea de ler atentamente essas informaes todas as vezes. Fale com seu pediatra a respeito do uso deste medicamento em crianas. Embora este medicamento possa ser administrado em crianas a partir de 1 ms de idade para certos quadros clnicos, algumas precaues so necessrias. Superdosagem: Se achar que tomou uma superdosagem deste medicamento, entre em contato imediatamente com o [facility] ou v a um pronto-socorro. OBSERVAO: Este medicamento  s para voc. No compartilhe este medicamento com outras pessoas. E se eu deixar de tomar uma dose? Se perder uma dose, tome-a assim que possvel. Se j estiver quase na hora da sua prxima dose, tome somente essa dose. No tome o remdio em dobro, nem tome uma dose adicional. O que pode interagir com este medicamento? No tome este medicamento com nenhum dos seguintes:  alfaepoetina Esta lista pode no descrever todas as interaes possveis. D ao seu profissional de sade uma lista de todos os medicamentos, ervas medicinais, remdios de venda livre, ou suplementos alimentares que voc usa. Diga tambm se voc fuma, bebe, ou usa drogas ilcitas. Alguns destes podem interagir com o seu medicamento. Ao que devo ficar atento quando estiver usando este medicamento? Voc ser monitorado(a) atentamente enquanto estiver tomando este medicamento. Voc precisar fazer exames de sangue peridicos enquanto estiver tomando este medicamento. Este medicamento pode causar uma diminuio nos nveis de vitamina B6. Voc deve consumir vitamina B6 em quantidade suficiente enquanto estiver tomando este medicamento. Converse com seu mdico ou profissional de sade a respeito dos alimentos que consome e das vitaminas que toma. Que efeitos colaterais posso sentir aps usar este medicamento? Efeitos colaterais que devem ser informados ao seu mdico ou profissional de sade o mais rpido    possvel:  reaes alrgicas, como erupo na pele, coceira, urticria, ou inchao do rosto, dos lbios ou da lngua  dificuldade para respirar  alteraes na viso  dor no peito  confuso, dificuldade para falar ou entender os outros  sensao de tontura, desmaio, quedas  presso alta  dores musculares  dor, inchao, calor na perna  ganho rpido de peso  dor de cabea forte  dormncia ou fraqueza sbita do rosto, brao ou perna  dificuldade para andar, tontura, perda de equilbrio ou coordenao  convulses (crises convulsivas)  inchao dos tornozelos, ps ou mos  fraqueza ou cansao fora do comum Efeitos colaterais que normalmente no precisam de cuidados mdicos (avise ao seu mdico ou profissional de sade se persistirem ou forem incmodos):  diarreia  febre, calafrios (sintomas gripais)  dor de cabea  enjoo ou vmitos  vermelhido, ardncia ou inchao no local da injeo Esta lista pode no descrever todos os efeitos colaterais possveis. Para mais orientaes sobre efeitos colaterais, consulte o seu mdico. Voc pode relatar a ocorrncia de efeitos colaterais  FDA pelo telefone [phone]. Onde devo guardar meu medicamento? Manter fora do alcance das crianas. Conservar sob refrigerao, entre 2 e 8 degreesC (36 e 46 degreesF). No congelar. No agite. Descartar qualquer poro no utilizada se estiver usando um frasco-ampola de dose nica. Descartar qualquer medicamento no utilizado aps a data de validade impressa no rtulo ou embalagem. OBSERVAO: Este folheto  um resumo. Pode no cobrir todas as informaes possveis. Se tiver dvidas a respeito deste medicamento, fale com seu mdico, farmacutico ou profissional de sade.  2020 Elsevier/Gold Standard (2017-07-07 00:00:00)  

## 2020-02-02 NOTE — Progress Notes (Signed)
Remote pacemaker transmission.   

## 2020-02-02 NOTE — Progress Notes (Signed)
Hematology and Oncology Follow Up Visit  [name] [id number] [birth year] 86 y.o. 02/02/2020   Principle Diagnosis:  Erythropoietin deficiency anemia Iron deficiency anemia  Current Therapy:   IV iron as indicated -- feraheme given on 07/27/2019 Aranesp 300 g sq prn hemoglobin < 11 - last dose given on  07/18/2019   Interim History:  [name] is here today for follow-up.  Overall, he is doing fairly well.  I typically see him in church on Sundays.  Last Sunday, he did fall after church.  He scraped his left forearm.  He has a dressing on the left forearm.  He has had no problems with bleeding.  He has had no problems with his kidneys.  His renal function is about the same.  We last saw him in August, his ferritin was 528 with an iron saturation of 30%.  He has had no problems with nausea or vomiting.  His appetite has been doing okay.  There is no problems with bowels or bladder.  He is doing okay with respect to having past therapy for the superficial bladder cancer.  Overall, his performance status is ECOG 1.    Medications:  Allergies as of 02/02/2020      Reactions   Penicillins Hives   Has patient had a PCN reaction causing immediate rash, facial/tongue/throat swelling, SOB or lightheadedness with hypotension: No Has patient had a PCN reaction causing severe rash involving mucus membranes or skin necrosis: Yes Has patient had a PCN reaction that required hospitalization: No Has patient had a PCN reaction occurring within the last 10 years: No If all of the above answers are "NO", then may proceed with Cephalosporin use.   Vancomycin Other (See Comments)   Edema and myalgia      Medication List       Accurate as of February 02, 2020  1:26 PM. If you have any questions, ask your nurse or doctor.        STOP taking these medications   Co Q 10 100 MG Caps Stopped by: [name], MD     TAKE these medications   Accu-Chek FastClix Lancets Misc Check  fasting blood sugar once daily   Accu-Chek Guide test strip Generic drug: glucose blood Check fasting blood sugar once daily   allopurinol 300 MG tablet Commonly known as: ZYLOPRIM Take 0.5 tablets (150 mg total) by mouth daily.   amLODipine 5 MG tablet Commonly known as: NORVASC TAKE 1 TABLET DAILY   atorvastatin 20 MG tablet Commonly known as: LIPITOR TAKE 1 TABLET AT BEDTIME   blood glucose meter kit and supplies Dispense based on patient and insurance preference. Use up to four times daily as directed. R73.03   carvedilol 6.25 MG tablet Commonly known as: COREG Take 1 tablet (6.25 mg total) by mouth 2 (two) times daily with a meal.   Cholecalciferol 125 MCG (5000 UT) capsule Take 5,000 Units by mouth daily.   CHROMIUM GTF PO Take 1 capsule by mouth every evening.   Cinnamon 500 MG Tabs Take 2 tablets by mouth at bedtime.   dicyclomine 10 MG capsule Commonly known as: BENTYL Take 1 capsule (10 mg total) by mouth 4 (four) times daily -  before meals and at bedtime.   diphenoxylate-atropine 2.5-0.025 MG tablet Commonly known as: LOMOTIL 1 tablet. Pt takes PRN   Eliquis 2.5 MG Tabs tablet Generic drug: apixaban Take 2.5 mg by mouth 2 (two) times daily.   finasteride 5 MG tablet Commonly known  as: PROSCAR Take 5 mg by mouth daily.   fish oil-omega-3 fatty acids 1000 MG capsule Take 1 g by mouth daily.   furosemide 20 MG tablet Commonly known as: LASIX Take 1 tablet (20 mg total) by mouth as directed.   latanoprost 0.005 % ophthalmic solution Commonly known as: XALATAN Place 1 drop into both eyes at bedtime.   potassium chloride 10 MEQ tablet Commonly known as: KLOR-CON Take 1 tablet (10 mEq total) by mouth daily.   PRESERVISION AREDS PO Take 1 tablet by mouth 2 (two) times daily.   PROBIOTIC PO Take 1 tablet by mouth daily.   tamsulosin 0.4 MG Caps capsule Commonly known as: FLOMAX Take 0.4 mg by mouth.   timolol 0.5 % ophthalmic  solution Commonly known as: TIMOPTIC Place 1 drop into both eyes 2 (two) times daily.   VANADYL SULFATE PO Take 1 capsule by mouth every evening.   zinc gluconate 50 MG tablet Take 50 mg by mouth daily.       Allergies:  Allergies  Allergen Reactions  . Penicillins Hives    Has patient had a PCN reaction causing immediate rash, facial/tongue/throat swelling, SOB or lightheadedness with hypotension: No Has patient had a PCN reaction causing severe rash involving mucus membranes or skin necrosis: Yes Has patient had a PCN reaction that required hospitalization: No Has patient had a PCN reaction occurring within the last 10 years: No If all of the above answers are "NO", then may proceed with Cephalosporin use.   . Vancomycin Other (See Comments)    Edema and myalgia    Past Medical History, Surgical history, Social history, and Family History were reviewed and updated.  Review of Systems: Review of Systems  Constitutional: Negative.   HENT: Negative.   Eyes: Negative.   Respiratory: Negative.   Cardiovascular: Positive for palpitations.  Gastrointestinal: Negative.   Genitourinary: Negative.   Musculoskeletal: Negative.   Skin: Negative.   Neurological: Negative.   Endo/Heme/Allergies: Negative.   Psychiatric/Behavioral: Negative.      Physical Exam:  weight is 154 lb (69.9 kg). His oral temperature is 98.5 F (36.9 C). His blood pressure is 155/61 (abnormal) and his pulse is 74. His respiration is 18 and oxygen saturation is 99%.   Wt Readings from Last 3 Encounters:  02/02/20 154 lb (69.9 kg)  12/02/19 151 lb 12.8 oz (68.9 kg)  11/24/19 153 lb 6 oz (69.6 kg)    Physical Exam Vitals reviewed.  HENT:     Head: Normocephalic and atraumatic.  Eyes:     Pupils: Pupils are equal, round, and reactive to light.  Cardiovascular:     Rate and Rhythm: Normal rate and regular rhythm.     Heart sounds: Normal heart sounds.  Pulmonary:     Effort: Pulmonary effort  is normal.     Breath sounds: Normal breath sounds.  Abdominal:     General: Bowel sounds are normal.     Palpations: Abdomen is soft.  Musculoskeletal:        General: No tenderness or deformity. Normal range of motion.     Cervical back: Normal range of motion.  Lymphadenopathy:     Cervical: No cervical adenopathy.  Skin:    General: Skin is warm and dry.     Findings: No erythema or rash.  Neurological:     Mental Status: He is alert and oriented to person, place, and time.  Psychiatric:        Behavior: Behavior normal.  Thought Content: Thought content normal.        Judgment: Judgment normal.      Lab Results  Component Value Date   WBC 7.4 02/02/2020   HGB 10.7 (L) 02/02/2020   HCT 32.4 (L) 02/02/2020   MCV 93.9 02/02/2020   PLT 156 02/02/2020   Lab Results  Component Value Date   FERRITIN 528 (H) 12/02/2019   IRON 83 12/02/2019   TIBC 276 12/02/2019   UIBC 194 12/02/2019   IRONPCTSAT 30 12/02/2019   Lab Results  Component Value Date   RETICCTPCT 1.7 02/02/2020   RBC 3.44 (L) 02/02/2020   RBC 3.45 (L) 02/02/2020   No results found for: KPAFRELGTCHN, LAMBDASER, KAPLAMBRATIO No results found for: Kandis Cocking, IGMSERUM Lab Results  Component Value Date   TOTALPROTELP 6.0 (L) 12/26/2015   ALBUMINELP 3.4 (L) 12/26/2015   A1GS 0.4 (H) 12/26/2015   A2GS 0.8 12/26/2015   BETS 0.4 12/26/2015   BETA2SER 0.3 12/26/2015   GAMS 0.8 12/26/2015   MSPIKE Not Observed 01/29/2016   SPEI SEE NOTE 12/26/2015     Chemistry      Component Value Date/Time   NA 140 02/02/2020 1201   NA 141 08/11/2017 0924   NA 145 04/09/2017 0905   NA 140 03/28/2016 0853   K 4.6 02/02/2020 1201   K 4.5 04/09/2017 0905   K 4.4 03/28/2016 0853   CL 105 02/02/2020 1201   CL 105 04/09/2017 0905   CO2 26 02/02/2020 1201   CO2 27 04/09/2017 0905   CO2 19 (L) 03/28/2016 0853   BUN 36 (H) 02/02/2020 1201   BUN 27 08/11/2017 0924   BUN 33 (H) 04/09/2017 0905   BUN 34.4 (H)  03/28/2016 0853   CREATININE 1.63 (H) 02/02/2020 1201   CREATININE 1.72 (H) 08/18/2017 1543   CREATININE 1.7 (H) 03/28/2016 0853   GLU 113 07/24/2016 0000      Component Value Date/Time   CALCIUM 9.4 02/02/2020 1201   CALCIUM 9.2 04/09/2017 0905   CALCIUM 9.2 03/28/2016 0853   ALKPHOS 60 02/02/2020 1201   ALKPHOS 69 04/09/2017 0905   ALKPHOS 85 03/28/2016 0853   AST 38 02/02/2020 1201   AST 23 03/28/2016 0853   ALT 44 02/02/2020 1201   ALT 27 04/09/2017 0905   ALT 37 03/28/2016 0853   BILITOT 0.6 02/02/2020 1201   BILITOT 0.79 03/28/2016 0853      Impression and Plan: Mr. Runyon is a very pleasant 86 yo caucasian gentleman with both erythropoietin deficiency and iron deficiency anemia.   I would be surprised if his iron level is low this time.  However, given that he does have the bladder cancer, there may be some microscopic bleeding.  We will go ahead and give him Aranesp today.  I will plan to get him back right before the Thanksgiving holiday.  I want to make sure that his blood is going be okay for the holiday season.  If there is any problems, I see him in church on Sundays.  He will let me know if there is anything that we can do for him.Marland Kitchen  [name], MD 10/7/20211:26 PM

## 2020-02-03 LAB — IRON AND TIBC
Iron: 76 ug/dL (ref 42–163)
Saturation Ratios: 30 % (ref 20–55)
TIBC: 254 ug/dL (ref 202–409)
UIBC: 178 ug/dL (ref 117–376)

## 2020-02-03 LAB — FERRITIN: Ferritin: 505 ng/mL — ABNORMAL HIGH (ref 24–336)

## 2020-02-07 DIAGNOSIS — C678 Malignant neoplasm of overlapping sites of bladder: Secondary | ICD-10-CM | POA: Diagnosis not present

## 2020-02-07 DIAGNOSIS — Z5111 Encounter for antineoplastic chemotherapy: Secondary | ICD-10-CM | POA: Diagnosis not present

## 2020-02-27 ENCOUNTER — Other Ambulatory Visit: Payer: Self-pay | Admitting: Internal Medicine

## 2020-02-27 DIAGNOSIS — I4819 Other persistent atrial fibrillation: Secondary | ICD-10-CM

## 2020-02-27 NOTE — Telephone Encounter (Signed)
Prescription refill request for Eliquis received. Indication: a fib Last office visit: 07/06/19 Scr: 1.63 Age: 86 Weight: 69.9kg

## 2020-03-13 DIAGNOSIS — H401134 Primary open-angle glaucoma, bilateral, indeterminate stage: Secondary | ICD-10-CM | POA: Diagnosis not present

## 2020-03-16 ENCOUNTER — Other Ambulatory Visit: Payer: Self-pay | Admitting: Family Medicine

## 2020-03-16 DIAGNOSIS — I739 Peripheral vascular disease, unspecified: Secondary | ICD-10-CM | POA: Diagnosis not present

## 2020-03-16 DIAGNOSIS — L84 Corns and callosities: Secondary | ICD-10-CM | POA: Diagnosis not present

## 2020-03-16 DIAGNOSIS — L603 Nail dystrophy: Secondary | ICD-10-CM | POA: Diagnosis not present

## 2020-03-16 DIAGNOSIS — K580 Irritable bowel syndrome with diarrhea: Secondary | ICD-10-CM

## 2020-03-16 DIAGNOSIS — E1151 Type 2 diabetes mellitus with diabetic peripheral angiopathy without gangrene: Secondary | ICD-10-CM | POA: Diagnosis not present

## 2020-03-19 ENCOUNTER — Encounter: Payer: Self-pay | Admitting: Hematology & Oncology

## 2020-03-19 ENCOUNTER — Inpatient Hospital Stay: Payer: Medicare Other | Attending: Hematology & Oncology

## 2020-03-19 ENCOUNTER — Inpatient Hospital Stay (HOSPITAL_BASED_OUTPATIENT_CLINIC_OR_DEPARTMENT_OTHER): Payer: Medicare Other | Admitting: Hematology & Oncology

## 2020-03-19 ENCOUNTER — Other Ambulatory Visit: Payer: Self-pay

## 2020-03-19 ENCOUNTER — Inpatient Hospital Stay: Payer: Medicare Other

## 2020-03-19 VITALS — BP 156/74 | HR 76 | Temp 98.2°F | Resp 20 | Wt 154.4 lb

## 2020-03-19 DIAGNOSIS — D631 Anemia in chronic kidney disease: Secondary | ICD-10-CM | POA: Insufficient documentation

## 2020-03-19 DIAGNOSIS — C679 Malignant neoplasm of bladder, unspecified: Secondary | ICD-10-CM | POA: Insufficient documentation

## 2020-03-19 DIAGNOSIS — D500 Iron deficiency anemia secondary to blood loss (chronic): Secondary | ICD-10-CM | POA: Diagnosis not present

## 2020-03-19 DIAGNOSIS — Z79899 Other long term (current) drug therapy: Secondary | ICD-10-CM | POA: Diagnosis not present

## 2020-03-19 DIAGNOSIS — D509 Iron deficiency anemia, unspecified: Secondary | ICD-10-CM | POA: Insufficient documentation

## 2020-03-19 DIAGNOSIS — N189 Chronic kidney disease, unspecified: Secondary | ICD-10-CM | POA: Diagnosis not present

## 2020-03-19 DIAGNOSIS — I25708 Atherosclerosis of coronary artery bypass graft(s), unspecified, with other forms of angina pectoris: Secondary | ICD-10-CM

## 2020-03-19 DIAGNOSIS — N183 Chronic kidney disease, stage 3 unspecified: Secondary | ICD-10-CM

## 2020-03-19 LAB — IRON AND TIBC
Iron: 93 ug/dL (ref 42–163)
Saturation Ratios: 35 % (ref 20–55)
TIBC: 265 ug/dL (ref 202–409)
UIBC: 172 ug/dL (ref 117–376)

## 2020-03-19 LAB — FERRITIN: Ferritin: 360 ng/mL — ABNORMAL HIGH (ref 24–336)

## 2020-03-19 LAB — CBC WITH DIFFERENTIAL (CANCER CENTER ONLY)
Abs Immature Granulocytes: 0.03 10*3/uL (ref 0.00–0.07)
Basophils Absolute: 0 10*3/uL (ref 0.0–0.1)
Basophils Relative: 0 %
Eosinophils Absolute: 0.1 10*3/uL (ref 0.0–0.5)
Eosinophils Relative: 1 %
HCT: 34.9 % — ABNORMAL LOW (ref 39.0–52.0)
Hemoglobin: 11.8 g/dL — ABNORMAL LOW (ref 13.0–17.0)
Immature Granulocytes: 0 %
Lymphocytes Relative: 21 %
Lymphs Abs: 1.8 10*3/uL (ref 0.7–4.0)
MCH: 31.1 pg (ref 26.0–34.0)
MCHC: 33.8 g/dL (ref 30.0–36.0)
MCV: 92.1 fL (ref 80.0–100.0)
Monocytes Absolute: 0.6 10*3/uL (ref 0.1–1.0)
Monocytes Relative: 7 %
Neutro Abs: 6 10*3/uL (ref 1.7–7.7)
Neutrophils Relative %: 71 %
Platelet Count: 133 10*3/uL — ABNORMAL LOW (ref 150–400)
RBC: 3.79 MIL/uL — ABNORMAL LOW (ref 4.22–5.81)
RDW: 13.8 % (ref 11.5–15.5)
WBC Count: 8.6 10*3/uL (ref 4.0–10.5)
nRBC: 0 % (ref 0.0–0.2)

## 2020-03-19 LAB — CMP (CANCER CENTER ONLY)
ALT: 25 U/L (ref 0–44)
AST: 25 U/L (ref 15–41)
Albumin: 4 g/dL (ref 3.5–5.0)
Alkaline Phosphatase: 52 U/L (ref 38–126)
Anion gap: 7 (ref 5–15)
BUN: 28 mg/dL — ABNORMAL HIGH (ref 8–23)
CO2: 28 mmol/L (ref 22–32)
Calcium: 9.2 mg/dL (ref 8.9–10.3)
Chloride: 105 mmol/L (ref 98–111)
Creatinine: 1.69 mg/dL — ABNORMAL HIGH (ref 0.61–1.24)
GFR, Estimated: 38 mL/min — ABNORMAL LOW (ref >60)
Glucose, Bld: 146 mg/dL — ABNORMAL HIGH (ref 70–99)
Potassium: 4.8 mmol/L (ref 3.5–5.1)
Sodium: 140 mmol/L (ref 135–145)
Total Bilirubin: 0.5 mg/dL (ref 0.3–1.2)
Total Protein: 6.7 g/dL (ref 6.5–8.1)

## 2020-03-19 LAB — RETICULOCYTES
Immature Retic Fract: 6.8 % (ref 2.3–15.9)
RBC.: 3.85 MIL/uL — ABNORMAL LOW (ref 4.22–5.81)
Retic Count, Absolute: 39.7 10*3/uL (ref 19.0–186.0)
Retic Ct Pct: 1 % (ref 0.4–3.1)

## 2020-03-19 NOTE — Progress Notes (Signed)
Hematology and Oncology Follow Up Visit  [name] [id number] 12/08/1928 86 y.o. 03/19/2020   Principle Diagnosis:  Erythropoietin deficiency anemia Iron deficiency anemia  Current Therapy:   IV iron as indicated -- feraheme given on 07/27/2019 Aranesp 300 g sq prn hemoglobin < 11 - last dose given on  07/18/2019   Interim History:  [name] is here today for follow-up.  Overall, he is doing fairly well.  I typically see him in church on Sundays.  Unfortunately, he has macular degeneration.  He is not able to do as much as he wants to.  He can no longer play golf.  We did his iron studies last time we saw him.  His ferritin was 505 with iron saturation of 30%.  He has had no problems with bowels or bladder.  He has had no problems with bleeding.  There is no cough or shortness of breath.  His renal function is holding pretty steady right now.  He has had no leg swelling.  His appetite is good.  There is no nausea or vomiting.  He will have a nice Thanksgiving with his family.  Overall, his performance status is ECOG 1.    Medications:  Allergies as of 03/19/2020      Reactions   Penicillins Hives   Has patient had a PCN reaction causing immediate rash, facial/tongue/throat swelling, SOB or lightheadedness with hypotension: No Has patient had a PCN reaction causing severe rash involving mucus membranes or skin necrosis: Yes Has patient had a PCN reaction that required hospitalization: No Has patient had a PCN reaction occurring within the last 10 years: No If all of the above answers are "NO", then may proceed with Cephalosporin use.   Vancomycin Other (See Comments)   Edema and myalgia      Medication List       Accurate as of March 19, 2020  1:12 PM. If you have any questions, ask your nurse or doctor.        Accu-Chek FastClix Lancets Misc Check fasting blood sugar once daily   Accu-Chek Guide test strip Generic drug: glucose blood Check  fasting blood sugar once daily   allopurinol 300 MG tablet Commonly known as: ZYLOPRIM Take 0.5 tablets (150 mg total) by mouth daily.   amLODipine 5 MG tablet Commonly known as: NORVASC TAKE 1 TABLET DAILY   atorvastatin 20 MG tablet Commonly known as: LIPITOR TAKE 1 TABLET AT BEDTIME   blood glucose meter kit and supplies Dispense based on patient and insurance preference. Use up to four times daily as directed. R73.03   carvedilol 6.25 MG tablet Commonly known as: COREG Take 1 tablet (6.25 mg total) by mouth 2 (two) times daily with a meal.   Cholecalciferol 125 MCG (5000 UT) capsule Take 5,000 Units by mouth daily.   CHROMIUM GTF PO Take 1 capsule by mouth every evening.   Cinnamon 500 MG Tabs Take 2 tablets by mouth at bedtime.   dicyclomine 10 MG capsule Commonly known as: BENTYL Take 1 capsule (10 mg total) by mouth 4 (four) times daily -  before meals and at bedtime.   diphenoxylate-atropine 2.5-0.025 MG tablet Commonly known as: LOMOTIL 1 tablet. Pt takes PRN   Eliquis 2.5 MG Tabs tablet Generic drug: apixaban TAKE 1 TABLET TWICE A DAY   finasteride 5 MG tablet Commonly known as: PROSCAR Take 5 mg by mouth daily.   fish oil-omega-3 fatty acids 1000 MG capsule Take 1 g by mouth daily.     furosemide 20 MG tablet Commonly known as: LASIX Take 1 tablet (20 mg total) by mouth as directed.   latanoprost 0.005 % ophthalmic solution Commonly known as: XALATAN Place 1 drop into both eyes at bedtime.   potassium chloride 10 MEQ tablet Commonly known as: KLOR-CON Take 1 tablet (10 mEq total) by mouth daily.   PRESERVISION AREDS PO Take 1 tablet by mouth 2 (two) times daily.   PROBIOTIC PO Take 1 tablet by mouth daily.   tamsulosin 0.4 MG Caps capsule Commonly known as: FLOMAX Take 0.4 mg by mouth.   timolol 0.5 % ophthalmic solution Commonly known as: TIMOPTIC Place 1 drop into both eyes 2 (two) times daily.   VANADYL SULFATE PO Take 1 capsule  by mouth every evening.   zinc gluconate 50 MG tablet Take 50 mg by mouth daily.       Allergies:  Allergies  Allergen Reactions  . Penicillins Hives    Has patient had a PCN reaction causing immediate rash, facial/tongue/throat swelling, SOB or lightheadedness with hypotension: No Has patient had a PCN reaction causing severe rash involving mucus membranes or skin necrosis: Yes Has patient had a PCN reaction that required hospitalization: No Has patient had a PCN reaction occurring within the last 10 years: No If all of the above answers are "NO", then may proceed with Cephalosporin use.   . Vancomycin Other (See Comments)    Edema and myalgia    Past Medical History, Surgical history, Social history, and Family History were reviewed and updated.  Review of Systems: Review of Systems  Constitutional: Negative.   HENT: Negative.   Eyes: Negative.   Respiratory: Negative.   Cardiovascular: Positive for palpitations.  Gastrointestinal: Negative.   Genitourinary: Negative.   Musculoskeletal: Negative.   Skin: Negative.   Neurological: Negative.   Endo/Heme/Allergies: Negative.   Psychiatric/Behavioral: Negative.      Physical Exam:  weight is 154 lb 6.4 oz (70 kg). His oral temperature is 98.2 F (36.8 C). His blood pressure is 156/74 (abnormal) and his pulse is 76. His respiration is 20 and oxygen saturation is 99%.   Wt Readings from Last 3 Encounters:  03/19/20 154 lb 6.4 oz (70 kg)  02/02/20 154 lb (69.9 kg)  12/02/19 151 lb 12.8 oz (68.9 kg)    Physical Exam Vitals reviewed.  HENT:     Head: Normocephalic and atraumatic.  Eyes:     Pupils: Pupils are equal, round, and reactive to light.  Cardiovascular:     Rate and Rhythm: Normal rate and regular rhythm.     Heart sounds: Normal heart sounds.  Pulmonary:     Effort: Pulmonary effort is normal.     Breath sounds: Normal breath sounds.  Abdominal:     General: Bowel sounds are normal.     Palpations:  Abdomen is soft.  Musculoskeletal:        General: No tenderness or deformity. Normal range of motion.     Cervical back: Normal range of motion.  Lymphadenopathy:     Cervical: No cervical adenopathy.  Skin:    General: Skin is warm and dry.     Findings: No erythema or rash.  Neurological:     Mental Status: He is alert and oriented to person, place, and time.  Psychiatric:        Behavior: Behavior normal.        Thought Content: Thought content normal.        Judgment: Judgment normal.        Lab Results  Component Value Date   WBC 8.6 03/19/2020   HGB 11.8 (L) 03/19/2020   HCT 34.9 (L) 03/19/2020   MCV 92.1 03/19/2020   PLT 133 (L) 03/19/2020   Lab Results  Component Value Date   FERRITIN 505 (H) 02/02/2020   IRON 76 02/02/2020   TIBC 254 02/02/2020   UIBC 178 02/02/2020   IRONPCTSAT 30 02/02/2020   Lab Results  Component Value Date   RETICCTPCT 1.0 03/19/2020   RBC 3.79 (L) 03/19/2020   RBC 3.85 (L) 03/19/2020   No results found for: KPAFRELGTCHN, LAMBDASER, KAPLAMBRATIO No results found for: Kandis Cocking, IGMSERUM Lab Results  Component Value Date   TOTALPROTELP 6.0 (L) 12/26/2015   ALBUMINELP 3.4 (L) 12/26/2015   A1GS 0.4 (H) 12/26/2015   A2GS 0.8 12/26/2015   BETS 0.4 12/26/2015   BETA2SER 0.3 12/26/2015   GAMS 0.8 12/26/2015   MSPIKE Not Observed 01/29/2016   SPEI SEE NOTE 12/26/2015     Chemistry      Component Value Date/Time   NA 140 03/19/2020 1202   NA 141 08/11/2017 0924   NA 145 04/09/2017 0905   NA 140 03/28/2016 0853   K 4.8 03/19/2020 1202   K 4.5 04/09/2017 0905   K 4.4 03/28/2016 0853   CL 105 03/19/2020 1202   CL 105 04/09/2017 0905   CO2 28 03/19/2020 1202   CO2 27 04/09/2017 0905   CO2 19 (L) 03/28/2016 0853   BUN 28 (H) 03/19/2020 1202   BUN 27 08/11/2017 0924   BUN 33 (H) 04/09/2017 0905   BUN 34.4 (H) 03/28/2016 0853   CREATININE 1.69 (H) 03/19/2020 1202   CREATININE 1.72 (H) 08/18/2017 1543   CREATININE 1.7 (H)  03/28/2016 0853   GLU 113 07/24/2016 0000      Component Value Date/Time   CALCIUM 9.2 03/19/2020 1202   CALCIUM 9.2 04/09/2017 0905   CALCIUM 9.2 03/28/2016 0853   ALKPHOS 52 03/19/2020 1202   ALKPHOS 69 04/09/2017 0905   ALKPHOS 85 03/28/2016 0853   AST 25 03/19/2020 1202   AST 23 03/28/2016 0853   ALT 25 03/19/2020 1202   ALT 27 04/09/2017 0905   ALT 37 03/28/2016 0853   BILITOT 0.5 03/19/2020 1202   BILITOT 0.79 03/28/2016 0853      Impression and Plan: Mr. Cousineau is a very pleasant 86 yo caucasian gentleman with both erythropoietin deficiency and iron deficiency anemia.   I would be surprised if his iron level is low this time.  However, given that he does have the bladder cancer, there may be some microscopic bleeding.  I think that we can now get him through the holidays.  I feel confident that his hemoglobin will hold on pretty well.  I would not think that iron is going to be a problem for him.  We will plan to get him back in January.  [name], MD 11/22/20211:12 PM

## 2020-04-10 ENCOUNTER — Other Ambulatory Visit: Payer: Self-pay

## 2020-04-10 DIAGNOSIS — K580 Irritable bowel syndrome with diarrhea: Secondary | ICD-10-CM

## 2020-04-11 DIAGNOSIS — R310 Gross hematuria: Secondary | ICD-10-CM | POA: Diagnosis not present

## 2020-04-11 DIAGNOSIS — C678 Malignant neoplasm of overlapping sites of bladder: Secondary | ICD-10-CM | POA: Diagnosis not present

## 2020-05-01 ENCOUNTER — Ambulatory Visit (INDEPENDENT_AMBULATORY_CARE_PROVIDER_SITE_OTHER): Payer: Medicare Other

## 2020-05-01 ENCOUNTER — Other Ambulatory Visit: Payer: Self-pay

## 2020-05-01 DIAGNOSIS — I441 Atrioventricular block, second degree: Secondary | ICD-10-CM | POA: Diagnosis not present

## 2020-05-01 LAB — CUP PACEART REMOTE DEVICE CHECK
Battery Remaining Longevity: 105 mo
Battery Remaining Percentage: 95.5 %
Battery Voltage: 2.99 V
Brady Statistic AP VP Percent: 35 %
Brady Statistic AP VS Percent: 1 %
Brady Statistic AS VP Percent: 62 %
Brady Statistic AS VS Percent: 1.9 %
Brady Statistic RA Percent Paced: 1.1 %
Brady Statistic RV Percent Paced: 97 %
Date Time Interrogation Session: 20220104024525
Implantable Lead Implant Date: 20190402
Implantable Lead Implant Date: 20190402
Implantable Lead Location: 753859
Implantable Lead Location: 753860
Implantable Pulse Generator Implant Date: 20190402
Lead Channel Impedance Value: 350 Ohm
Lead Channel Impedance Value: 480 Ohm
Lead Channel Pacing Threshold Amplitude: 0.5 V
Lead Channel Pacing Threshold Amplitude: 0.75 V
Lead Channel Pacing Threshold Pulse Width: 0.5 ms
Lead Channel Pacing Threshold Pulse Width: 0.5 ms
Lead Channel Sensing Intrinsic Amplitude: 0.5 mV
Lead Channel Sensing Intrinsic Amplitude: 12 mV
Lead Channel Setting Pacing Amplitude: 2 V
Lead Channel Setting Pacing Amplitude: 2.5 V
Lead Channel Setting Pacing Pulse Width: 0.5 ms
Lead Channel Setting Sensing Sensitivity: 2 mV
Pulse Gen Model: 2272
Pulse Gen Serial Number: 9003454

## 2020-05-02 ENCOUNTER — Encounter: Payer: Self-pay | Admitting: Family Medicine

## 2020-05-02 ENCOUNTER — Ambulatory Visit (INDEPENDENT_AMBULATORY_CARE_PROVIDER_SITE_OTHER): Payer: Medicare Other | Admitting: Family Medicine

## 2020-05-02 ENCOUNTER — Other Ambulatory Visit: Payer: Self-pay | Admitting: Family Medicine

## 2020-05-02 VITALS — BP 142/48 | HR 69 | Temp 97.7°F | Ht 68.0 in | Wt 156.0 lb

## 2020-05-02 DIAGNOSIS — E7849 Other hyperlipidemia: Secondary | ICD-10-CM

## 2020-05-02 DIAGNOSIS — I480 Paroxysmal atrial fibrillation: Secondary | ICD-10-CM

## 2020-05-02 DIAGNOSIS — I25708 Atherosclerosis of coronary artery bypass graft(s), unspecified, with other forms of angina pectoris: Secondary | ICD-10-CM | POA: Diagnosis not present

## 2020-05-02 DIAGNOSIS — D508 Other iron deficiency anemias: Secondary | ICD-10-CM

## 2020-05-02 DIAGNOSIS — K580 Irritable bowel syndrome with diarrhea: Secondary | ICD-10-CM

## 2020-05-02 DIAGNOSIS — I441 Atrioventricular block, second degree: Secondary | ICD-10-CM

## 2020-05-02 DIAGNOSIS — I10 Essential (primary) hypertension: Secondary | ICD-10-CM | POA: Diagnosis not present

## 2020-05-02 DIAGNOSIS — Z7901 Long term (current) use of anticoagulants: Secondary | ICD-10-CM

## 2020-05-02 DIAGNOSIS — M109 Gout, unspecified: Secondary | ICD-10-CM

## 2020-05-02 DIAGNOSIS — I5032 Chronic diastolic (congestive) heart failure: Secondary | ICD-10-CM | POA: Diagnosis not present

## 2020-05-02 DIAGNOSIS — E559 Vitamin D deficiency, unspecified: Secondary | ICD-10-CM

## 2020-05-02 DIAGNOSIS — R7303 Prediabetes: Secondary | ICD-10-CM

## 2020-05-02 DIAGNOSIS — C673 Malignant neoplasm of anterior wall of bladder: Secondary | ICD-10-CM

## 2020-05-02 DIAGNOSIS — D631 Anemia in chronic kidney disease: Secondary | ICD-10-CM

## 2020-05-02 DIAGNOSIS — D500 Iron deficiency anemia secondary to blood loss (chronic): Secondary | ICD-10-CM

## 2020-05-02 DIAGNOSIS — N183 Chronic kidney disease, stage 3 unspecified: Secondary | ICD-10-CM | POA: Diagnosis not present

## 2020-05-02 MED ORDER — POTASSIUM CHLORIDE ER 10 MEQ PO TBCR: 10.0000 meq | EXTENDED_RELEASE_TABLET | Freq: Every day | ORAL | 3 refills | Status: DC

## 2020-05-02 MED ORDER — CARVEDILOL 6.25 MG PO TABS: 6.2500 mg | ORAL_TABLET | Freq: Two times a day (BID) | ORAL | 3 refills | Status: DC

## 2020-05-02 MED ORDER — TETANUS-DIPHTH-ACELL PERTUSSIS 5-2.5-18.5 LF-MCG/0.5 IM SUSP: 0.5000 mL | Freq: Once | INTRAMUSCULAR | 0 refills | Status: AC

## 2020-05-02 MED ORDER — ALLOPURINOL 300 MG PO TABS: 150.0000 mg | ORAL_TABLET | Freq: Every day | ORAL | 3 refills | Status: DC

## 2020-05-02 MED ORDER — AMLODIPINE BESYLATE 5 MG PO TABS: 5.0000 mg | ORAL_TABLET | Freq: Every day | ORAL | 1 refills | Status: DC

## 2020-05-02 NOTE — Patient Instructions (Addendum)
Great to see you today.  Next appt need of June for chronic conditions.   I have refilled your meds.   You look great.

## 2020-05-02 NOTE — Progress Notes (Signed)
[name] , 04-12-1929, 87 y.o., male MRN: [id number] Patient Care Team    Relationship Specialty Notifications Start End  [name], DO PCP - General Family Medicine  12/18/15   [name], MD PCP - Cardiology Cardiology  03/17/17   [name], MD Consulting Physician Ophthalmology  12/18/15   [name], MD Consulting Physician Dermatology  12/18/15   [name], MD Consulting Physician Cardiology  12/18/15   [name], MD Consulting Physician Ophthalmology  12/19/15   [name], MD Consulting Physician Oncology  09/30/16   [name], MD Attending Physician Nephrology  09/30/16   [name], MD Consulting Physician Dermatology  09/30/16   Franchot Gallo, MD Consulting Physician Urology  06/30/19   Inocencio Homes, DPM Consulting Physician Podiatry  06/30/19     Chief Complaint  Patient presents with  . Follow-up    [facility]; pt is not fasting    Subjective: [name] is a 87 y.o. male present for [facility] Hypertension//A.Fib/IDA/chronic anticoagulation Patient reports compliance with  Coreg 6.25  twice a day and amlodipine 5 mg QD, lasix about once weekly use needed, Lipitor and Eliquis twice daily. He takes fish oil supplementation of 1000 mg daily. Patient denies chest pain, shortness of breath, dizziness or lower extremity edema.  Reviewed labs collected by his hematologist from June 2021 Diet: Low sodium diet followed Exercise: very active RF: CKD3, HLD, CAD w/ CABG (1999), cardiac cath 2011 2/2 CP (all grafts normal), PVD, Afib Significant cardiac history of CAD with prior CABG in 1999 -LIMA to the LAD, SVG to IM, OM1, OM 2, SVG to D1, SVG to PDA/PLA, EF 45-50% with mild diffuse hypokinesis. Grafts patent by cath 2011 Nuclear stress test 08/2015 scar, no ischemia LVEF 70% also has atrial fibrillation/flutter in 2014 on Eliquis status post [facility] 10/2014, PVD, claudication.  Cardioversion 04/01/2017.  Pacemaker implant 07/28/2017.  Vascular  carotid duplex 10/02/2017: "Mild carotid plaque bilaterally continue with prevention efforts "  Echo 04/03/17  EF 92-42%  Grade 2 diastolic  Mild to moderate MR Moderate LAE mild RAE   BPH/cancer anterior wall of urinary bladder: Prediabetes:  He has been a prediabetic well controlled by diet in the past.  Last A1c 5.9 Gout: Patient reports gout is well controlled on allopurinol 150 mg daily. CKD/vitamin D deficiency: Creatinine 1.90/GFR 31 10/21/2019.  Patient reports taking 5000 units vitamin D daily. IBS: Patient reports his IBS is well controlled -but he does have take the Bentyl as needed.  Allergies  Allergen Reactions  . Penicillins Hives    Has patient had a PCN reaction causing immediate rash, facial/tongue/throat swelling, SOB or lightheadedness with hypotension: No Has patient had a PCN reaction causing severe rash involving mucus membranes or skin necrosis: Yes Has patient had a PCN reaction that required hospitalization: No Has patient had a PCN reaction occurring within the last 10 years: No If all of the above answers are "NO", then may proceed with Cephalosporin use.   . Vancomycin Other (See Comments)    Edema and myalgia   Social History   Tobacco Use  . Smoking status: Former Smoker    Quit date: 12/25/1961    Years since quitting: 58.3  . Smokeless tobacco: Former Systems developer    Types: Chew    Quit date: 02/03/1969  Substance Use Topics  . Alcohol use: No    Alcohol/week: 0.0 standard drinks   Past Medical History:  Diagnosis Date  . A-fib (  [place])   . Basal cell carcinoma    skin  . Bigeminal rhythm   . Bradycardia 06/21/2017  . Cancer of anterior wall of urinary bladder ([place]) 07/18/2019  . Cataract   . Chronic renal insufficiency, stage 3 (moderate) (HCC) 2018   GFR 30s-40s  . Coronary artery disease    post bypass  . CVD (cardiovascular disease)   . Diabetes mellitus without complication ([place])    type 2 diet controlled  . Diverticular disease   . Easy  bruising   . 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 Gout   . Hematuria 06/30/2019  . Hernia   . Hyperkalemia 05/06/2017  . Hyperlipidemia   . Hypertension   . IBS (irritable bowel syndrome)   . Iron deficiency anemia 02/04/2016  . Macular degeneration   . Microscopic colitis   . PVD (peripheral vascular disease) ([place])    Past Surgical History:  Procedure Laterality Date  . CARDIAC CATHETERIZATION  2006  . CARDIOVERSION N/A 02/22/2013   Procedure: CARDIOVERSION;  Surgeon: [name], MD;  Location: [facility] ENDOSCOPY;  Service: Cardiovascular;  Laterality: N/A;  . CARDIOVERSION N/A 11/15/2014   Procedure: CARDIOVERSION;  Surgeon: [name], MD;  Location: [facility] ENDOSCOPY;  Service: Cardiovascular;  Laterality: N/A;  . CARDIOVERSION N/A 04/01/2017   Procedure: CARDIOVERSION;  Surgeon: Larey Dresser, MD;  Location: [place];  Service: Cardiovascular;  Laterality: N/A;  . CAROTID ENDARTERECTOMY  2009/ 1993   left/ right  . COLONOSCOPY  03/17/2005   The colon is normal.  . CORONARY ARTERY BYPASS GRAFT  1999  . CYSTOSCOPY W/ RETROGRADES Bilateral 07/14/2019   Procedure: CYSTOSCOPY WITH RETROGRADE PYELOGRAM;  Surgeon: Franchot Gallo, MD;  Location: [facility];  Service: Urology;  Laterality: Bilateral;  . ESOPHAGOGASTRODUODENOSCOPY  03/17/2005   Normal esophagus. Normal Stomanch. Normal duodenum.   Marland Kitchen EYE SURGERY     eyelid repair  . INGUINAL HERNIA REPAIR  02/11/2012   Procedure: LAPAROSCOPIC BILATERAL INGUINAL HERNIA REPAIR;  Surgeon: Pedro Earls, MD;  Location: [facility]S;  Service: General;  Laterality: Bilateral;  . PACEMAKER IMPLANT N/A 07/28/2017   St Jude Medical Assurity MRI conditional  dual-chamber pacemaker for symptomatic second degree AV block by Dr Rayann Heman  . SKIN CANCER EXCISION     right ear x 3  . TRANSURETHRAL RESECTION OF BLADDER TUMOR N/A 09/05/2019   Procedure: TRANSURETHRAL RESECTION OF BLADDER TUMOR (TURBT);  Surgeon: Franchot Gallo,  MD;  Location: [facility];  Service: Urology;  Laterality: N/A;  . TRANSURETHRAL RESECTION OF BLADDER TUMOR WITH MITOMYCIN-C N/A 07/14/2019   Procedure: TRANSURETHRAL RESECTION OF BLADDER TUMOR WITH GEMCITABINE IN PACU;  Surgeon: Franchot Gallo, MD;  Location: [facility];  Service: Urology;  Laterality: N/A;   Family History  Problem Relation Age of Onset  . Stroke Mother   . Coronary artery disease Father   . Heart disease Father   . Melanoma Sister   . Colon cancer Neg Hx    Allergies as of 05/02/2020      Reactions   Penicillins Hives   Has patient had a PCN reaction causing immediate rash, facial/tongue/throat swelling, SOB or lightheadedness with hypotension: No Has patient had a PCN reaction causing severe rash involving mucus membranes or skin necrosis: Yes Has patient had a PCN reaction that required hospitalization: No Has patient had a PCN reaction occurring within the last 10 years: No If all of the above answers are "NO", then may proceed with Cephalosporin use.  Vancomycin Other (See Comments)   Edema and myalgia      Medication List       Accurate as of May 02, 2020 11:59 PM. If you have any questions, ask your nurse or doctor.        Accu-Chek FastClix Lancets Misc Check fasting blood sugar once daily   Accu-Chek Guide test strip Generic drug: glucose blood Check fasting blood sugar once daily   allopurinol 300 MG tablet Commonly known as: ZYLOPRIM Take 0.5 tablets (150 mg total) by mouth daily.   amLODipine 5 MG tablet Commonly known as: NORVASC Take 1 tablet (5 mg total) by mouth daily.   atorvastatin 20 MG tablet Commonly known as: LIPITOR TAKE 1 TABLET AT BEDTIME   blood glucose meter kit and supplies Dispense based on patient and insurance preference. Use up to four times daily as directed. R73.03   carvedilol 6.25 MG tablet Commonly known as: COREG Take 1 tablet (6.25 mg total) by mouth 2 (two) times  daily with a meal.   Cholecalciferol 125 MCG (5000 UT) capsule Take 5,000 Units by mouth daily.   CHROMIUM GTF PO Take 1 capsule by mouth every evening.   Cinnamon 500 MG Tabs Take 2 tablets by mouth at bedtime.   dicyclomine 10 MG capsule Commonly known as: BENTYL TAKE 1 CAPSULE (10 MG TOTAL) BY MOUTH 4 (FOUR) TIMES DAILY - BEFORE MEALS AND AT BEDTIME.   diphenoxylate-atropine 2.5-0.025 MG tablet Commonly known as: LOMOTIL 1 tablet. Pt takes PRN   Eliquis 2.5 MG Tabs tablet Generic drug: apixaban TAKE 1 TABLET TWICE A DAY   finasteride 5 MG tablet Commonly known as: PROSCAR Take 5 mg by mouth daily.   fish oil-omega-3 fatty acids 1000 MG capsule Take 1 g by mouth daily.   furosemide 20 MG tablet Commonly known as: LASIX Take 1 tablet (20 mg total) by mouth as directed.   latanoprost 0.005 % ophthalmic solution Commonly known as: XALATAN Place 1 drop into both eyes at bedtime.   potassium chloride 10 MEQ tablet Commonly known as: KLOR-CON Take 1 tablet (10 mEq total) by mouth daily.   PRESERVISION AREDS PO Take 1 tablet by mouth 2 (two) times daily.   PROBIOTIC PO Take 1 tablet by mouth daily.   tamsulosin 0.4 MG Caps capsule Commonly known as: FLOMAX Take 0.4 mg by mouth.   Tdap 5-2.5-18.5 LF-MCG/0.5 injection Commonly known as: BOOSTRIX Inject 0.5 mLs into the muscle once for 1 dose. Started by: Howard Pouch, DO   timolol 0.5 % ophthalmic solution Commonly known as: TIMOPTIC Place 1 drop into both eyes 2 (two) times daily.   UNABLE TO FIND Med Name: Mito Q   VANADYL SULFATE PO Take 1 capsule by mouth every evening.   zinc gluconate 50 MG tablet Take 50 mg by mouth daily.       No results found for this or any previous visit (from the past 24 hour(s)). No results found.   ROS: Negative, with the exception of above mentioned in HPI  Objective:  BP (!) 142/48   Pulse 69   Temp 97.7 F (36.5 C) (Oral)   Ht '5\' 8"'  (1.727 m)   Wt 156 lb  (70.8 kg)   SpO2 98%   BMI 23.72 kg/m  Body mass index is 23.72 kg/m.  Gen: Afebrile. No acute distress.  Nontoxic.  Very pleasant male. HENT: AT. [place]. No cough or hoarseness. Eyes:Pupils Equal Round Reactive to light, Extraocular movements intact,  Conjunctiva without redness,  discharge or icterus. Neck/lymp/endocrine: Supple,no lymphadenopathy, no thyromegaly CV: RRR , trace edema, +2/4 P posterior tibialis pulses Chest: CTAB, no wheeze or crackles Abd: Soft. NTND. BS present Neuro: Normal gait. PERLA. EOMi. Alert. Oriented x3.  Psych: Normal affect, dress and demeanor. Normal speech. Normal thought content and judgment.  Assessment/Plan: [name] is a 87 y.o. male present for OV for  Essential hypertension/Paroxysmal atrial fibrillation (HCC)/Stage 3 chronic kidney disease/HLD//CHF/CAD/PVD Stable.  Home blood pressures are normal. -Continue amlodipine 5 mg daily> cardio -Continue Coreg 6.25 mg twice daily. -Continue Eliquis>cardio -Continue Lasix 20 mg PRN- using about once weekly.  QD PRN depending upon weight and fluid ( 3lb & fluid rule).  - continue  fish oil supplementation -  he  has difficulty controlling potassium on ace/arb-  lisinopril was dc'd in the past 2/2 to hyperkalemia - now he is on a potasium supplement daily to maintain level> continue K-Dur 10 mEq daily. - Low-sodium diet.  -  He BP has been  very difficult to control at times and his hydration and IDA/epo injections cause fluctuations leading to both highs and lower than desired readings. Certainly would rather mildly elevated BP over too low. -Labs up-to-date - Follow-up 5.5 months, as long as seeing Dr. Johnsie Cancel routinely as well.  Anemias IDA and EPO:  - managed by heme/onc.  Patient reports he is feeling well  Prediabetes Patient has been very well diet controlled. 5.7> 6.4> 6.5>5.9>6.5 collected today Continue dietary modifications and exercise.  Gout, unspecified cause, unspecified  chronicity, unspecified site Stable.  Continue allopurinol Continue renally dosed allopurinol  Hematuria/BPH/bladder cancer: Patient reports his bladder cancer is responding to treatment through urology.  States they plan on doing 3 more treatment plans, but he was told this is mainly just to be extra thorough and preventative that they felt his cancer was well treated. He is prescribed Flomax and Proscar by urology.  CKD 3: Last creatinine 31; 09/2019 Avoid NSAIDs Renally dose medications PTH/calcium and vitamin D collected today.  Meds managed by primary team: [facility]: Lasix, potassium, Bentyl [facility]: Coreg, Lipitor, allopurinol  No orders of the defined types were placed in this encounter.  Meds ordered this encounter  Medications  . Tdap (BOOSTRIX) 5-2.5-18.5 LF-MCG/0.5 injection    Sig: Inject 0.5 mLs into the muscle once for 1 dose.    Dispense:  0.5 mL    Refill:  0  . potassium chloride (KLOR-CON) 10 MEQ tablet    Sig: Take 1 tablet (10 mEq total) by mouth daily.    Dispense:  90 tablet    Refill:  3  . carvedilol (COREG) 6.25 MG tablet    Sig: Take 1 tablet (6.25 mg total) by mouth 2 (two) times daily with a meal.    Dispense:  180 tablet    Refill:  3  . amLODipine (NORVASC) 5 MG tablet    Sig: Take 1 tablet (5 mg total) by mouth daily.    Dispense:  90 tablet    Refill:  1  . allopurinol (ZYLOPRIM) 300 MG tablet    Sig: Take 0.5 tablets (150 mg total) by mouth daily.    Dispense:  45 tablet    Refill:  3   Referral Orders  No referral(s) requested today     electronically signed by:  Howard Pouch, DO  [place]

## 2020-05-08 ENCOUNTER — Inpatient Hospital Stay: Payer: Medicare Other | Admitting: Family

## 2020-05-08 ENCOUNTER — Inpatient Hospital Stay: Payer: Medicare Other

## 2020-05-09 ENCOUNTER — Inpatient Hospital Stay: Payer: Medicare Other | Attending: Hematology & Oncology

## 2020-05-09 ENCOUNTER — Inpatient Hospital Stay (HOSPITAL_BASED_OUTPATIENT_CLINIC_OR_DEPARTMENT_OTHER): Payer: Medicare Other | Admitting: Family

## 2020-05-09 ENCOUNTER — Other Ambulatory Visit: Payer: Self-pay

## 2020-05-09 ENCOUNTER — Inpatient Hospital Stay: Payer: Medicare Other

## 2020-05-09 VITALS — BP 148/59 | HR 70 | Temp 98.0°F | Resp 20 | Wt 155.4 lb

## 2020-05-09 DIAGNOSIS — D631 Anemia in chronic kidney disease: Secondary | ICD-10-CM

## 2020-05-09 DIAGNOSIS — N189 Chronic kidney disease, unspecified: Secondary | ICD-10-CM | POA: Insufficient documentation

## 2020-05-09 DIAGNOSIS — D500 Iron deficiency anemia secondary to blood loss (chronic): Secondary | ICD-10-CM

## 2020-05-09 DIAGNOSIS — D509 Iron deficiency anemia, unspecified: Secondary | ICD-10-CM

## 2020-05-09 DIAGNOSIS — D508 Other iron deficiency anemias: Secondary | ICD-10-CM

## 2020-05-09 DIAGNOSIS — I25708 Atherosclerosis of coronary artery bypass graft(s), unspecified, with other forms of angina pectoris: Secondary | ICD-10-CM | POA: Diagnosis not present

## 2020-05-09 LAB — CBC WITH DIFFERENTIAL (CANCER CENTER ONLY)
Abs Immature Granulocytes: 0.04 10*3/uL (ref 0.00–0.07)
Basophils Absolute: 0 10*3/uL (ref 0.0–0.1)
Basophils Relative: 0 %
Eosinophils Absolute: 0.2 10*3/uL (ref 0.0–0.5)
Eosinophils Relative: 3 %
HCT: 33 % — ABNORMAL LOW (ref 39.0–52.0)
Hemoglobin: 10.9 g/dL — ABNORMAL LOW (ref 13.0–17.0)
Immature Granulocytes: 1 %
Lymphocytes Relative: 26 %
Lymphs Abs: 1.7 10*3/uL (ref 0.7–4.0)
MCH: 29.9 pg (ref 26.0–34.0)
MCHC: 33 g/dL (ref 30.0–36.0)
MCV: 90.4 fL (ref 80.0–100.0)
Monocytes Absolute: 0.7 10*3/uL (ref 0.1–1.0)
Monocytes Relative: 10 %
Neutro Abs: 4 10*3/uL (ref 1.7–7.7)
Neutrophils Relative %: 60 %
Platelet Count: 148 10*3/uL — ABNORMAL LOW (ref 150–400)
RBC: 3.65 MIL/uL — ABNORMAL LOW (ref 4.22–5.81)
RDW: 14.8 % (ref 11.5–15.5)
WBC Count: 6.6 10*3/uL (ref 4.0–10.5)
nRBC: 0 % (ref 0.0–0.2)

## 2020-05-09 LAB — CMP (CANCER CENTER ONLY)
ALT: 35 U/L (ref 0–44)
AST: 31 U/L (ref 15–41)
Albumin: 4.1 g/dL (ref 3.5–5.0)
Alkaline Phosphatase: 70 U/L (ref 38–126)
Anion gap: 7 (ref 5–15)
BUN: 35 mg/dL — ABNORMAL HIGH (ref 8–23)
CO2: 27 mmol/L (ref 22–32)
Calcium: 9.5 mg/dL (ref 8.9–10.3)
Chloride: 106 mmol/L (ref 98–111)
Creatinine: 1.9 mg/dL — ABNORMAL HIGH (ref 0.61–1.24)
GFR, Estimated: 33 mL/min — ABNORMAL LOW (ref >60)
Glucose, Bld: 115 mg/dL — ABNORMAL HIGH (ref 70–99)
Potassium: 5.2 mmol/L — ABNORMAL HIGH (ref 3.5–5.1)
Sodium: 140 mmol/L (ref 135–145)
Total Bilirubin: 0.6 mg/dL (ref 0.3–1.2)
Total Protein: 6.5 g/dL (ref 6.5–8.1)

## 2020-05-09 LAB — RETICULOCYTES
Immature Retic Fract: 12.4 % (ref 2.3–15.9)
RBC.: 3.51 MIL/uL — ABNORMAL LOW (ref 4.22–5.81)
Retic Count, Absolute: 49.1 10*3/uL (ref 19.0–186.0)
Retic Ct Pct: 1.4 % (ref 0.4–3.1)

## 2020-05-09 MED ORDER — DARBEPOETIN ALFA 300 MCG/0.6ML IJ SOSY
PREFILLED_SYRINGE | INTRAMUSCULAR | Status: AC
  Filled 2020-05-09: qty 0.6

## 2020-05-09 MED ORDER — DARBEPOETIN ALFA 300 MCG/0.6ML IJ SOSY
300.0000 ug | PREFILLED_SYRINGE | Freq: Once | INTRAMUSCULAR | Status: AC
  Administered 2020-05-09: 300 ug via SUBCUTANEOUS

## 2020-05-09 NOTE — Progress Notes (Signed)
Discharged ambulatory in no distress.

## 2020-05-09 NOTE — Patient Instructions (Signed)
Darbepoetin Alfa injection What is this medicine? DARBEPOETIN ALFA (dar be POE e tin AL fa) helps your body make more red blood cells. It is used to treat anemia caused by chronic kidney failure and chemotherapy. This medicine may be used for other purposes; ask your health care provider or pharmacist if you have questions. COMMON BRAND NAME(S): Aranesp What should I tell my health care provider before I take this medicine? They need to know if you have any of these conditions:  blood clotting disorders or history of blood clots  cancer patient not on chemotherapy  cystic fibrosis  heart disease, such as angina, heart failure, or a history of a heart attack  hemoglobin level of 12 g/dL or greater  high blood pressure  low levels of folate, iron, or vitamin B12  seizures  an unusual or allergic reaction to darbepoetin, erythropoietin, albumin, hamster proteins, latex, other medicines, foods, dyes, or preservatives  pregnant or trying to get pregnant  breast-feeding How should I use this medicine? This medicine is for injection into a vein or under the skin. It is usually given by a health care professional in a hospital or clinic setting. If you get this medicine at home, you will be taught how to prepare and give this medicine. Use exactly as directed. Take your medicine at regular intervals. Do not take your medicine more often than directed. It is important that you put your used needles and syringes in a special sharps container. Do not put them in a trash can. If you do not have a sharps container, call your pharmacist or healthcare provider to get one. A special MedGuide will be given to you by the pharmacist with each prescription and refill. Be sure to read this information carefully each time. Talk to your pediatrician regarding the use of this medicine in children. While this medicine may be used in children as young as 1 month of age for selected conditions, precautions do  apply. Overdosage: If you think you have taken too much of this medicine contact a poison control center or emergency room at once. NOTE: This medicine is only for you. Do not share this medicine with others. What if I miss a dose? If you miss a dose, take it as soon as you can. If it is almost time for your next dose, take only that dose. Do not take double or extra doses. What may interact with this medicine? Do not take this medicine with any of the following medications:  epoetin alfa This list may not describe all possible interactions. Give your health care provider a list of all the medicines, herbs, non-prescription drugs, or dietary supplements you use. Also tell them if you smoke, drink alcohol, or use illegal drugs. Some items may interact with your medicine. What should I watch for while using this medicine? Your condition will be monitored carefully while you are receiving this medicine. You may need blood work done while you are taking this medicine. This medicine may cause a decrease in vitamin B6. You should make sure that you get enough vitamin B6 while you are taking this medicine. Discuss the foods you eat and the vitamins you take with your health care professional. What side effects may I notice from receiving this medicine? Side effects that you should report to your doctor or health care professional as soon as possible:  allergic reactions like skin rash, itching or hives, swelling of the face, lips, or tongue  breathing problems  changes in   vision  chest pain  confusion, trouble speaking or understanding  feeling faint or lightheaded, falls  high blood pressure  muscle aches or pains  pain, swelling, warmth in the leg  rapid weight gain  severe headaches  sudden numbness or weakness of the face, arm or leg  trouble walking, dizziness, loss of balance or coordination  seizures (convulsions)  swelling of the ankles, feet, hands  unusually weak or  tired Side effects that usually do not require medical attention (report to your doctor or health care professional if they continue or are bothersome):  diarrhea  fever, chills (flu-like symptoms)  headaches  nausea, vomiting  redness, stinging, or swelling at site where injected This list may not describe all possible side effects. Call your doctor for medical advice about side effects. You may report side effects to FDA at 1-800-FDA-1088. Where should I keep my medicine? Keep out of the reach of children. Store in a refrigerator between 2 and 8 degrees C (36 and 46 degrees F). Do not freeze. Do not shake. Throw away any unused portion if using a single-dose vial. Throw away any unused medicine after the expiration date. NOTE: This sheet is a summary. It may not cover all possible information. If you have questions about this medicine, talk to your doctor, pharmacist, or health care provider.  2021 Elsevier/Gold Standard (2017-04-29 16:44:20)  

## 2020-05-09 NOTE — Progress Notes (Signed)
Hematology and Oncology Follow Up Visit  [name] [id number] 1928-06-29 87 y.o. 05/09/2020   Principle Diagnosis:  Erythropoietin deficiency anemia Iron deficiency anemia Superficial bladder cancer (high-grade urothelial carcinoma with invasion into the lamina propria) - managed by Urology Dr. Diona Fanti   Current Therapy:        IV iron as indicated  Aranesp 300 g sq prn hemoglobin < 11    Interim History:  [name] is here today for follow-up and Aranesp. He has some mild fatigue at times but is doing well.  No blood loss noted. No abnormal bruising, no petechiae.  Hgb is 10.9, platelets 148 and WBC count is 6.6.  BUN is up slightly at 35 and creatinine 1.90.  No fever, chills, n/v, cough, rash, dizziness, SOB, chest pain, palpitations, abdominal pain or changes in bowel or bladder habits.  He has chronic swelling in the right lower extremity since have a vein harvested for bypass surgery years ago. No redness or pitting. Pedal pulses are 2+.  No falls or syncope. He gets around nicely and uses a walking stick while out if needed.  He has maintained a good appetite and is staying well hydrated. Her weight is stable.   ECOG Performance Status: 1 - Symptomatic but completely ambulatory  Medications:  Allergies as of 05/09/2020      Reactions   Penicillins Hives   Has patient had a PCN reaction causing immediate rash, facial/tongue/throat swelling, SOB or lightheadedness with hypotension: No Has patient had a PCN reaction causing severe rash involving mucus membranes or skin necrosis: Yes Has patient had a PCN reaction that required hospitalization: No Has patient had a PCN reaction occurring within the last 10 years: No If all of the above answers are "NO", then may proceed with Cephalosporin use.   Vancomycin Other (See Comments)   Edema and myalgia      Medication List       Accurate as of May 09, 2020 11:08 AM. If you have any questions, ask your nurse or  doctor.        Accu-Chek FastClix Lancets Misc Check fasting blood sugar once daily   Accu-Chek Guide test strip Generic drug: glucose blood Check fasting blood sugar once daily   allopurinol 300 MG tablet Commonly known as: ZYLOPRIM Take 0.5 tablets (150 mg total) by mouth daily.   amLODipine 5 MG tablet Commonly known as: NORVASC Take 1 tablet (5 mg total) by mouth daily.   atorvastatin 20 MG tablet Commonly known as: LIPITOR TAKE 1 TABLET AT BEDTIME   blood glucose meter kit and supplies Dispense based on patient and insurance preference. Use up to four times daily as directed. R73.03   carvedilol 6.25 MG tablet Commonly known as: COREG Take 1 tablet (6.25 mg total) by mouth 2 (two) times daily with a meal.   Cholecalciferol 125 MCG (5000 UT) capsule Take 5,000 Units by mouth daily.   CHROMIUM GTF PO Take 1 capsule by mouth every evening.   Cinnamon 500 MG Tabs Take 2 tablets by mouth at bedtime.   dicyclomine 10 MG capsule Commonly known as: BENTYL TAKE 1 CAPSULE (10 MG TOTAL) BY MOUTH 4 (FOUR) TIMES DAILY - BEFORE MEALS AND AT BEDTIME.   diphenoxylate-atropine 2.5-0.025 MG tablet Commonly known as: LOMOTIL 1 tablet. Pt takes PRN   Eliquis 2.5 MG Tabs tablet Generic drug: apixaban TAKE 1 TABLET TWICE A DAY   finasteride 5 MG tablet Commonly known as: PROSCAR Take 5 mg by mouth daily.  fish oil-omega-3 fatty acids 1000 MG capsule Take 1 g by mouth daily.   furosemide 20 MG tablet Commonly known as: LASIX Take 1 tablet (20 mg total) by mouth as directed.   latanoprost 0.005 % ophthalmic solution Commonly known as: XALATAN Place 1 drop into both eyes at bedtime.   potassium chloride 10 MEQ tablet Commonly known as: KLOR-CON Take 1 tablet (10 mEq total) by mouth daily.   PRESERVISION AREDS PO Take 1 tablet by mouth 2 (two) times daily.   PROBIOTIC PO Take 1 tablet by mouth daily.   tamsulosin 0.4 MG Caps capsule Commonly known as:  FLOMAX Take 0.4 mg by mouth.   timolol 0.5 % ophthalmic solution Commonly known as: TIMOPTIC Place 1 drop into both eyes 2 (two) times daily.   UNABLE TO FIND Med Name: Mito Q   VANADYL SULFATE PO Take 1 capsule by mouth every evening.   zinc gluconate 50 MG tablet Take 50 mg by mouth daily.       Allergies:  Allergies  Allergen Reactions  . Penicillins Hives    Has patient had a PCN reaction causing immediate rash, facial/tongue/throat swelling, SOB or lightheadedness with hypotension: No Has patient had a PCN reaction causing severe rash involving mucus membranes or skin necrosis: Yes Has patient had a PCN reaction that required hospitalization: No Has patient had a PCN reaction occurring within the last 10 years: No If all of the above answers are "NO", then may proceed with Cephalosporin use.   . Vancomycin Other (See Comments)    Edema and myalgia    Past Medical History, Surgical history, Social history, and Family History were reviewed and updated.  Review of Systems: All other 10 point review of systems is negative.   Physical Exam:  vitals were not taken for this visit.   Wt Readings from Last 3 Encounters:  05/02/20 156 lb (70.8 kg)  03/19/20 154 lb 6.4 oz (70 kg)  02/02/20 154 lb (69.9 kg)    Ocular: Sclerae unicteric, pupils equal, round and reactive to light Ear-nose-throat: Oropharynx clear, dentition fair Lymphatic: No cervical or supraclavicular adenopathy Lungs no rales or rhonchi, good excursion bilaterally Heart regular rate and rhythm, no murmur appreciated Abd soft, nontender, positive bowel sounds MSK no focal spinal tenderness, no joint edema Neuro: non-focal, well-oriented, appropriate affect Breasts: Deferred   Lab Results  Component Value Date   WBC 6.6 05/09/2020   HGB 10.9 (L) 05/09/2020   HCT 33.0 (L) 05/09/2020   MCV 90.4 05/09/2020   PLT 148 (L) 05/09/2020   Lab Results  Component Value Date   FERRITIN 360 (H)  03/19/2020   IRON 93 03/19/2020   TIBC 265 03/19/2020   UIBC 172 03/19/2020   IRONPCTSAT 35 03/19/2020   Lab Results  Component Value Date   RETICCTPCT 1.4 05/09/2020   RBC 3.51 (L) 05/09/2020   No results found for: KPAFRELGTCHN, LAMBDASER, KAPLAMBRATIO No results found for: Kandis Cocking, IGMSERUM Lab Results  Component Value Date   TOTALPROTELP 6.0 (L) 12/26/2015   ALBUMINELP 3.4 (L) 12/26/2015   A1GS 0.4 (H) 12/26/2015   A2GS 0.8 12/26/2015   BETS 0.4 12/26/2015   BETA2SER 0.3 12/26/2015   GAMS 0.8 12/26/2015   MSPIKE Not Observed 01/29/2016   SPEI SEE NOTE 12/26/2015     Chemistry      Component Value Date/Time   NA 140 03/19/2020 1202   NA 141 08/11/2017 0924   NA 145 04/09/2017 0905   NA 140 03/28/2016 0853  K 4.8 03/19/2020 1202   K 4.5 04/09/2017 0905   K 4.4 03/28/2016 0853   CL 105 03/19/2020 1202   CL 105 04/09/2017 0905   CO2 28 03/19/2020 1202   CO2 27 04/09/2017 0905   CO2 19 (L) 03/28/2016 0853   BUN 28 (H) 03/19/2020 1202   BUN 27 08/11/2017 0924   BUN 33 (H) 04/09/2017 0905   BUN 34.4 (H) 03/28/2016 0853   CREATININE 1.69 (H) 03/19/2020 1202   CREATININE 1.72 (H) 08/18/2017 1543   CREATININE 1.7 (H) 03/28/2016 0853   GLU 113 07/24/2016 0000      Component Value Date/Time   CALCIUM 9.2 03/19/2020 1202   CALCIUM 9.2 04/09/2017 0905   CALCIUM 9.2 03/28/2016 0853   ALKPHOS 52 03/19/2020 1202   ALKPHOS 69 04/09/2017 0905   ALKPHOS 85 03/28/2016 0853   AST 25 03/19/2020 1202   AST 23 03/28/2016 0853   ALT 25 03/19/2020 1202   ALT 27 04/09/2017 0905   ALT 37 03/28/2016 0853   BILITOT 0.5 03/19/2020 1202   BILITOT 0.79 03/28/2016 0853       Impression and Plan: [name] is a very pleasant 87 yo caucasian gentleman with both erythropoietin deficiency and iron deficiency anemia as well as bladder cancer managed by his Urologist.  He received Aranesp today for Hgb 10.9.  Iron studies are pending. We will replace if needed.  Follow-up  in 6 weeks.  He can contact our office with any questions or concerns. We can certainly see him sooner if needed.   Laverna Peace, NP 1/12/202211:08 AM

## 2020-05-10 LAB — IRON AND TIBC
Iron: 78 ug/dL (ref 42–163)
Saturation Ratios: 29 % (ref 20–55)
TIBC: 265 ug/dL (ref 202–409)
UIBC: 187 ug/dL (ref 117–376)

## 2020-05-10 LAB — FERRITIN: Ferritin: 446 ng/mL — ABNORMAL HIGH (ref 24–336)

## 2020-05-15 NOTE — Progress Notes (Signed)
Remote pacemaker transmission.   

## 2020-05-28 ENCOUNTER — Other Ambulatory Visit: Payer: Self-pay | Admitting: Cardiovascular Disease

## 2020-06-06 ENCOUNTER — Telehealth: Payer: Self-pay | Admitting: Internal Medicine

## 2020-06-06 NOTE — Telephone Encounter (Signed)
Patient reports he has no edema, drainage, pain,  or redness at pacemaker site. Remote transmission reviewed and showed device function WNL, AF known issue , + Eliquis. He reports he fell in October on left shoulder and when he sleeps on his left side or puts pressure on the left shoulder he  has a wave of "discomfort" that radiates from left shoulder into scapula. Patient has same sensation with movement of LUE. Advised patient to call PCP to have shoulder evaluated. Wishes to keep f/u with Lynnell Jude NP on 06/12/20.

## 2020-06-06 NOTE — Telephone Encounter (Signed)
New message    1. Has your device fired? No  2. Is you device beeping? No  3. Are you experiencing draining or swelling at device site? NO  4. Are you calling to see if we received your device transmission? No  5. Have you passed out? No  Pt states that when he sleeps on his left side, it feels like a shock almost and sometimes feels hot in that area.   Pt asked for an appt, scheduled with Chanetta Marshall 02.15.22 at 8:20am.  Please route to [place]

## 2020-06-06 NOTE — Telephone Encounter (Signed)
LMOM to call DC . DC # and hours provided. 

## 2020-06-07 DIAGNOSIS — C44319 Basal cell carcinoma of skin of other parts of face: Secondary | ICD-10-CM | POA: Diagnosis not present

## 2020-06-07 DIAGNOSIS — L570 Actinic keratosis: Secondary | ICD-10-CM | POA: Diagnosis not present

## 2020-06-07 DIAGNOSIS — L821 Other seborrheic keratosis: Secondary | ICD-10-CM | POA: Diagnosis not present

## 2020-06-07 DIAGNOSIS — L812 Freckles: Secondary | ICD-10-CM | POA: Diagnosis not present

## 2020-06-07 DIAGNOSIS — Z85820 Personal history of malignant melanoma of skin: Secondary | ICD-10-CM | POA: Diagnosis not present

## 2020-06-07 DIAGNOSIS — D485 Neoplasm of uncertain behavior of skin: Secondary | ICD-10-CM | POA: Diagnosis not present

## 2020-06-07 DIAGNOSIS — Z85828 Personal history of other malignant neoplasm of skin: Secondary | ICD-10-CM | POA: Diagnosis not present

## 2020-06-12 ENCOUNTER — Ambulatory Visit (INDEPENDENT_AMBULATORY_CARE_PROVIDER_SITE_OTHER): Payer: Medicare Other | Admitting: Nurse Practitioner

## 2020-06-12 ENCOUNTER — Other Ambulatory Visit: Payer: Self-pay

## 2020-06-12 ENCOUNTER — Encounter: Payer: Self-pay | Admitting: Nurse Practitioner

## 2020-06-12 VITALS — BP 160/76 | HR 70 | Ht 68.0 in | Wt 154.4 lb

## 2020-06-12 DIAGNOSIS — I4819 Other persistent atrial fibrillation: Secondary | ICD-10-CM | POA: Diagnosis not present

## 2020-06-12 DIAGNOSIS — I442 Atrioventricular block, complete: Secondary | ICD-10-CM

## 2020-06-12 DIAGNOSIS — D6869 Other thrombophilia: Secondary | ICD-10-CM | POA: Diagnosis not present

## 2020-06-12 DIAGNOSIS — I10 Essential (primary) hypertension: Secondary | ICD-10-CM | POA: Diagnosis not present

## 2020-06-12 DIAGNOSIS — I25708 Atherosclerosis of coronary artery bypass graft(s), unspecified, with other forms of angina pectoris: Secondary | ICD-10-CM | POA: Diagnosis not present

## 2020-06-12 LAB — CUP PACEART INCLINIC DEVICE CHECK
Battery Remaining Longevity: 109 mo
Battery Voltage: 2.99 V
Brady Statistic RA Percent Paced: 1 %
Brady Statistic RV Percent Paced: 98 %
Date Time Interrogation Session: 20220215084618
Implantable Lead Implant Date: 20190402
Implantable Lead Implant Date: 20190402
Implantable Lead Location: 753859
Implantable Lead Location: 753860
Implantable Pulse Generator Implant Date: 20190402
Lead Channel Impedance Value: 362.5 Ohm
Lead Channel Impedance Value: 475 Ohm
Lead Channel Pacing Threshold Amplitude: 0.75 V
Lead Channel Pacing Threshold Amplitude: 0.75 V
Lead Channel Pacing Threshold Pulse Width: 0.5 ms
Lead Channel Pacing Threshold Pulse Width: 0.5 ms
Lead Channel Sensing Intrinsic Amplitude: 1 mV
Lead Channel Sensing Intrinsic Amplitude: 12 mV
Lead Channel Setting Pacing Amplitude: 2.5 V
Lead Channel Setting Pacing Pulse Width: 0.5 ms
Lead Channel Setting Sensing Sensitivity: 2 mV
Pulse Gen Model: 2272
Pulse Gen Serial Number: 9003454

## 2020-06-12 NOTE — Progress Notes (Signed)
Electrophysiology Office Note Date: 06/12/2020  ID:  [name], DOB May 03, 1928, MRN [id number]  PCP: [name], DO Electrophysiologist: Allred  CC: Pacemaker follow-up  [name] is a 87 y.o. male seen today for Dr Rayann Heman.  He presents today for routine electrophysiology followup.  Since last being seen in our clinic, the patient reports doing reasonably well.  He remains active at [employer] without exertional symptoms. He has had some left shoulder discomfort intermittently that is position dependent.   He denies chest pain, palpitations, dyspnea, PND, orthopnea, nausea, vomiting, dizziness, syncope, edema (above baseline), weight gain, or early satiety.  Device History: STJ dual chamber PPM implanted 2019 for complete heart block    Past Medical History:  Diagnosis Date  . A-fib ([place])   . Basal cell carcinoma    skin  . Bigeminal rhythm   . Bradycardia 06/21/2017  . Cancer of anterior wall of urinary bladder ([place]) 07/18/2019  . Cataract   . Chronic renal insufficiency, stage 3 (moderate) (H[facility]) 2018   GFR 30s-40s  . Coronary artery disease    post bypass  . CVD (cardiovascular disease)   . Diabetes mellitus without complication ([place])    type 2 diet controlled  . Diverticular disease   . Easy bruising   . 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 Gout   . Hematuria 06/30/2019  . Hernia   . Hyperkalemia 05/06/2017  . Hyperlipidemia   . Hypertension   . IBS (irritable bowel syndrome)   . Iron deficiency anemia 02/04/2016  . Macular degeneration   . Microscopic colitis   . PVD (peripheral vascular disease) ([place])    Past Surgical History:  Procedure Laterality Date  . CARDIAC CATHETERIZATION  2006  . CARDIOVERSION N/A 02/22/2013   Procedure: CARDIOVERSION;  Surgeon: [name], MD;  Location: [facility] ENDOSCOPY;  Service: Cardiovascular;  Laterality: N/A;  . CARDIOVERSION N/A 11/15/2014   Procedure: CARDIOVERSION;  Surgeon: [name], MD;   Location: [facility] ENDOSCOPY;  Service: Cardiovascular;  Laterality: N/A;  . CARDIOVERSION N/A 04/01/2017   Procedure: CARDIOVERSION;  Surgeon: Larey Dresser, MD;  Location: [place];  Service: Cardiovascular;  Laterality: N/A;  . CAROTID ENDARTERECTOMY  2009/ 1993   left/ right  . COLONOSCOPY  03/17/2005   The colon is normal.  . CORONARY ARTERY BYPASS GRAFT  1999  . CYSTOSCOPY W/ RETROGRADES Bilateral 07/14/2019   Procedure: CYSTOSCOPY WITH RETROGRADE PYELOGRAM;  Surgeon: Franchot Gallo, MD;  [facility];  Service: Urology;  Laterality: Bilateral;  . ESOPHAGOGASTRODUODENOSCOPY  03/17/2005   Normal esophagus. Normal Stomanch. Normal duodenum.   Marland Kitchen EYE SURGERY     eyelid repair  . INGUINAL HERNIA REPAIR  02/11/2012   Procedure: LAPAROSCOPIC BILATERAL INGUINAL HERNIA REPAIR;  Surgeon: Pedro Earls, MD;  Location: [facility]S;  Service: General;  Laterality: Bilateral;  . PACEMAKER IMPLANT N/A 07/28/2017   St Jude Medical Assurity MRI conditional  dual-chamber pacemaker for symptomatic second degree AV block by Dr Rayann Heman  . SKIN CANCER EXCISION     right ear x 3  . TRANSURETHRAL RESECTION OF BLADDER TUMOR N/A 09/05/2019   Procedure: TRANSURETHRAL RESECTION OF BLADDER TUMOR (TURBT);  Surgeon: Franchot Gallo, MD;  Location: [facility];  Service: Urology;  Laterality: N/A;  . TRANSURETHRAL RESECTION OF BLADDER TUMOR WITH MITOMYCIN-C N/A 07/14/2019   Procedure: TRANSURETHRAL RESECTION OF BLADDER TUMOR WITH GEMCITABINE IN PACU;  Surgeon: Franchot Gallo, MD;  Location: [facility];  Service: Urology;  Laterality: N/A;    Current Outpatient Medications  Medication Sig Dispense Refill  . ACCU-CHEK FASTCLIX LANCETS MISC Check fasting blood sugar once daily 100 each 3  . allopurinol (ZYLOPRIM) 300 MG tablet Take 0.5 tablets (150 mg total) by mouth daily. 45 tablet 3  . amLODipine (NORVASC) 5 MG tablet Take 1 tablet (5 mg total) by mouth  daily. 90 tablet 1  . atorvastatin (LIPITOR) 20 MG tablet TAKE 1 TABLET AT BEDTIME 90 tablet 3  . blood glucose meter kit and supplies Dispense based on patient and insurance preference. Use up to four times daily as directed. R73.03 1 each 0  . carvedilol (COREG) 6.25 MG tablet Take 1 tablet (6.25 mg total) by mouth 2 (two) times daily with a meal. 180 tablet 3  . Cholecalciferol 125 MCG (5000 UT) capsule Take 5,000 Units by mouth daily.    . CHROMIUM GTF PO Take 1 capsule by mouth every evening.     . Cinnamon 500 MG TABS Take 2 tablets by mouth at bedtime.     . dicyclomine (BENTYL) 10 MG capsule TAKE 1 CAPSULE (10 MG TOTAL) BY MOUTH 4 (FOUR) TIMES DAILY - BEFORE MEALS AND AT BEDTIME. 360 capsule 1  . diphenoxylate-atropine (LOMOTIL) 2.5-0.025 MG tablet 1 tablet. Pt takes PRN    . ELIQUIS 2.5 MG TABS tablet TAKE 1 TABLET TWICE A DAY 180 tablet 1  . finasteride (PROSCAR) 5 MG tablet Take 5 mg by mouth daily.  3  . fish oil-omega-3 fatty acids 1000 MG capsule Take 1 g by mouth daily.     . furosemide (LASIX) 20 MG tablet Take 20 mg by mouth as needed.    Marland Kitchen glucose blood (ACCU-CHEK GUIDE) test strip Check fasting blood sugar once daily 100 each 0  . latanoprost (XALATAN) 0.005 % ophthalmic solution Place 1 drop into both eyes at bedtime.    . Multiple Vitamins-Minerals (PRESERVISION AREDS PO) Take 1 tablet by mouth 2 (two) times daily.     . potassium chloride (KLOR-CON) 10 MEQ tablet Take 1 tablet (10 mEq total) by mouth daily. 90 tablet 3  . Probiotic Product (PROBIOTIC PO) Take 1 tablet by mouth daily.    . tamsulosin (FLOMAX) 0.4 MG CAPS capsule Take 0.4 mg by mouth.    . timolol (TIMOPTIC) 0.5 % ophthalmic solution Place 1 drop into both eyes 2 (two) times daily.     Marland Kitchen UNABLE TO FIND Med Name: Mito Q    . VANADYL SULFATE PO Take 1 capsule by mouth every evening.     . zinc gluconate 50 MG tablet Take 50 mg by mouth daily.      No current facility-administered medications for this visit.    Facility-Administered Medications Ordered in Other Visits  Medication Dose Route Frequency Provider Last Rate Last Admin  . gemcitabine (GEMZAR) chemo syringe for bladder instillation 2,000 mg  2,000 mg Bladder Instillation Once Franchot Gallo, MD        Allergies:   Penicillins and Vancomycin   Social History: Social History   Socioeconomic History  . Marital status: Married    Spouse name: Not on file  . Number of children: Not on file  . Years of education: Not on file  . Highest education level: Not on file  Occupational History  . Occupation: retired  Tobacco Use  . Smoking status: Former Smoker    Quit date: 12/25/1961    Years since quitting: 58.5  . Smokeless tobacco: Former Systems developer  Types: Sarina Ser    Quit date: 02/03/1969  Vaping Use  . Vaping Use: Never used  Substance and Sexual Activity  . Alcohol use: No    Alcohol/week: 0.0 standard drinks  . Drug use: No  . Sexual activity: Never  Other Topics Concern  . Not on file  Social History Narrative   Married to [place], 2 children [name] and [name].   Some college. Retired from [employer].   Drinks caffeine, uses herbal remedies, takes a daily vitamin.   Wears his seatbelt, smoke detector at home, firearms in the home.   Wears a hearing aid.   Feels safe in her relationships.   Social Determinants of Health   Financial Resource Strain: Not on file  Food Insecurity: Not on file  Transportation Needs: Not on file  Physical Activity: Not on file  Stress: Not on file  Social Connections: Not on file  Intimate Partner Violence: Not on file    Family History: Family History  Problem Relation Age of Onset  . Stroke Mother   . Coronary artery disease Father   . Heart disease Father   . Melanoma Sister   . Colon cancer Neg Hx      Review of Systems: All other systems reviewed and are otherwise negative except as noted above.   Physical Exam: VS:  BP (!) 160/76   Pulse 70   Ht '5\' 8"'  (1.727  m)   Wt 154 lb 6.4 oz (70 kg)   SpO2 99%   BMI 23.48 kg/m  , BMI Body mass index is 23.48 kg/m.  GEN- The patient is elderly appearing, alert and oriented x 3 today.   HEENT: normocephalic, atraumatic; sclera clear, conjunctiva pink; hearing intact; oropharynx clear; neck supple  Lungs- Clear to ausculation bilaterally, normal work of breathing.  No wheezes, rales, rhonchi Heart- Regular rate and rhythm (paced) GI- soft, non-tender, non-distended, bowel sounds present  Extremities- no clubbing, cyanosis, or edema  MS- no significant deformity or atrophy Skin- warm and dry, no rash or lesion; PPM pocket well healed Psych- euthymic mood, full affect Neuro- strength and sensation are intact  PPM Interrogation- reviewed in detail today,  See PACEART report  EKG:  EKG is not ordered today.  Recent Labs: 11/24/2019: TSH 3.85 05/09/2020: ALT 35; BUN 35; Creatinine 1.90; Hemoglobin 10.9; Platelet Count 148; Potassium 5.2; Sodium 140   Wt Readings from Last 3 Encounters:  06/12/20 154 lb 6.4 oz (70 kg)  05/09/20 155 lb 6.4 oz (70.5 kg)  05/02/20 156 lb (70.8 kg)     Other studies Reviewed: Additional studies/ records that were reviewed today include: Dr Jackalyn Lombard office notes  Assessment and Plan:  1.  Complete heart block Normal PPM function See Pace Art report No changes today  2.  Persistent atrial fibrillation/ atypical atrial flutter No plans for rhythm control He has been in AF since at least 07/2019 Device reprogrammed VVIR today Continue Eliquis for CHADS2VASC of 5    4.  CAD Stable No change required today    Current medicines are reviewed at length with the patient today.   The patient does not have concerns regarding his medicines.  The following changes were made today:  none  Labs/ tests ordered today include:  Orders Placed This Encounter  Procedures  . CUP PACEART [place]  . EKG 12-Lead     Disposition:   Follow up with Mer[place], 1 year  Dr Rayann Heman     Signed, Chanetta Marshall, NP  06/12/2020 9:04 AM  [facility] [address] [place] [address] [phone] (office) [phone] (fax)

## 2020-06-12 NOTE — Patient Instructions (Signed)
Medication Instructions:  Your physician recommends that you continue on your current medications as directed. Please refer to the Current Medication list given to you today.  *If you need a refill on your cardiac medications before your next appointment, please call your pharmacy*   Lab Work: None If you have labs (blood work) drawn today and your tests are completely normal, you will receive your results only by: . MyChart Message (if you have MyChart) OR . A paper copy in the mail If you have any lab test that is abnormal or we need to change your treatment, we will call you to review the results.   Follow-Up: At [facility], you and your health needs are our priority.  As part of our continuing mission to provide you with exceptional heart care, we have created designated Provider Care Teams.  These Care Teams include your primary Cardiologist (physician) and Advanced Practice Providers (APPs -  Physician Assistants and Nurse Practitioners) who all work together to provide you with the care you need, when you need it.  Your next appointment:   1 year(s)  The format for your next appointment:   In Person  Provider:   You may see James Allred, MD or one of the following Advanced Practice Providers on your designated Care Team:    Amber Seiler, NP   

## 2020-06-13 ENCOUNTER — Encounter: Payer: Self-pay | Admitting: Family Medicine

## 2020-06-13 ENCOUNTER — Ambulatory Visit (INDEPENDENT_AMBULATORY_CARE_PROVIDER_SITE_OTHER): Payer: Medicare Other | Admitting: Family Medicine

## 2020-06-13 VITALS — BP 183/52 | HR 64 | Temp 98.0°F | Ht 68.0 in | Wt 155.0 lb

## 2020-06-13 DIAGNOSIS — G8929 Other chronic pain: Secondary | ICD-10-CM

## 2020-06-13 DIAGNOSIS — M25511 Pain in right shoulder: Secondary | ICD-10-CM

## 2020-06-13 DIAGNOSIS — M25512 Pain in left shoulder: Secondary | ICD-10-CM | POA: Insufficient documentation

## 2020-06-13 DIAGNOSIS — M65331 Trigger finger, right middle finger: Secondary | ICD-10-CM

## 2020-06-13 NOTE — Patient Instructions (Signed)
Acromioclavicular Separation  A shoulder separation (acromioclavicular separation) is an injury to the tissues that connect bones to each other (ligaments) between the top of your shoulder blade (acromion) and your collarbone (clavicle). The ligaments may be stretched, partially torn, or completely torn.  A stretched ligament may not cause much pain, and it does not move the collarbone out of place. A stretched ligament looks normal on an X-ray.  A partial tear causes an injury that is a bit worse, and it may move the collarbone slightly out of place.  A complete tear causes serious injury. The surrounding shoulder ligaments are completely torn. This moves the collarbone out of position and creates a bad shape (deformity) of the shoulder. What are the causes? Common causes of this condition include:  Falling on the shoulder.  Receiving a hard, direct hit (blow) to the top of the shoulder.  Falling on an outstretched arm. What increases the risk? You may be at greater risk of a shoulder separation if you:  Are male.  Are younger than 56.  Play a contact sport, such as football or hockey. What are the signs or symptoms? The most common symptom of a shoulder separation is pain on the top of the shoulder after falling on it or receiving a blow to it. Other signs and symptoms include:  Shoulder deformity.  Swelling of the shoulder.  Decreased ability to move the shoulder.  Bruising on top of the shoulder. How is this diagnosed? Your health care provider may suspect a shoulder separation based on your symptoms and the details of a recent injury you experienced. The condition will be diagnosed based on:  A physical exam. Your provider may: ? Press on your shoulder. ? Test the movement of your shoulder. ? Ask you to hold a weight in your hand to see if the separation increases.  Imaging tests, such as: ? X-rays. ? MRI. How is this treated? Treatment for this condition depends  on the cause and severity of the injury.  A shoulder separation caused by a stretched ligament may require 2-12 weeks of the following: ? Wearing a sling. ? Taking medicines to help relieve pain. ? Applying cold packs to your shoulder. ? Physical therapy. If needed, a physical therapist will teach you to do daily exercises to strengthen your shoulder muscles and prevent stiffness.  Surgery may be needed for severe injuries that include breaks (fractures) in a bone, or injuries that do not get better with nonsurgical treatments. To help with healing, you will need to keep your joint in place for a period of time (immobilization) and do physical therapy. Follow these instructions at home: Medicines  Take over-the-counter and prescription medicines only as told by your health care provider.  Do not drive or use heavy machinery while taking prescription pain medicine.  If you are taking prescription pain medicine, take actions to prevent or treat constipation. Your health care provider may recommend that you: ? Drink enough fluid to keep your urine pale yellow. ? Eat foods that are high in fiber, such as fresh fruits and vegetables, whole grains, and beans. ? Limit foods that are high in fat and processed sugars, such as fried or sweet foods. ? Take an over-the-counter or prescription medicine for constipation. If you have a sling:  Wear your sling as told by your health care provider. Remove it only as told by your health care provider.  Loosen the sling if your fingers tingle, become numb, or turn cold and blue.  Keep the sling clean.  If the sling is not waterproof: ? Do not let it get wet. ? Cover it with a watertight covering when you take a bath or a shower. Managing pain, stiffness, and swelling  If directed, apply ice to the top of your shoulder: ? Put ice in a plastic bag. ? Place a towel between your skin and the bag. ? Leave the ice on for 20 minutes, 2-3 times a  day.  Do not do any activities that make your pain worse.   Activity  Do not lift anything that is heavier than 10 lb (4.5 kg), or the limit that you were told, until your health care provider says that it is safe.  Rest your shoulder. Avoid activities that take a lot of effort (strenuous activities) for as long as told by your health care provider.  Return to your normal activities as told by your health care provider. Ask your health care provider what activities are safe for you.  Do range of motion exercises as told by your health care provider. General instructions  Do not use any products that contain nicotine or tobacco, such as cigarettes and e-cigarettes. These can delay healing. If you need help quitting, ask your health care provider.  Keep all follow-up visits as told by your health care provider. This is important. These include visits for physical therapy as directed by your health care provider. Contact a health care provider if:  Your pain medicine is not relieving your pain.  Your pain and stiffness are not improving after 2 weeks.  You are unable to do your physical therapy exercises because of pain or stiffness. Get help right away if:  Your arm on the injured side feels cold or numb.  Your skin or fingers on the arm on the injured side turn blue or gray. Summary  A shoulder separation (acromioclavicular separation) is an injury to the tissues that connect bones to each other (ligaments) between the top of your shoulder blade (acromion) and your collarbone (clavicle).  The ligaments may be stretched, partially torn, or completely torn.  The most common cause of a shoulder separation is falling on or receiving a blow to the top of the shoulder. Falling with an outstretched arm may also cause this injury.  Rest your shoulder. Avoid activities that take a lot of effort (strenuous activities) for as long as told by your health care provider. This information is  not intended to replace advice given to you by your health care provider. Make sure you discuss any questions you have with your health care provider. Document Revised: 05/30/2017 Document Reviewed: 05/30/2017 Elsevier Patient Education  2021 [employer].

## 2020-06-13 NOTE — Progress Notes (Signed)
This visit occurred during the SARS-CoV-2 public health emergency.  Safety protocols were in place, including screening questions prior to the visit, additional usage of staff PPE, and extensive cleaning of exam room while observing appropriate contact time as indicated for disinfecting solutions.    [name] , April 07, 1929, 87 y.o., male MRN: [id number] Patient Care Team    Relationship Specialty Notifications Start End  [name], DO PCP - General Family Medicine  12/18/15   [name], MD PCP - Cardiology Cardiology  03/17/17   [name], MD Consulting Physician Ophthalmology  12/18/15   [name], MD Consulting Physician Dermatology  12/18/15   [name], MD Consulting Physician Cardiology  12/18/15   [name], MD Consulting Physician Ophthalmology  12/19/15   [name], MD Consulting Physician Oncology  09/30/16   [name], MD Attending Physician Nephrology  09/30/16   [name], MD Consulting Physician Dermatology  09/30/16   Franchot Gallo, MD Consulting Physician Urology  06/30/19   Inocencio Homes, DPM Consulting Physician Podiatry  06/30/19     Chief Complaint  Patient presents with  . Fall    Pt states he had a fall 3 weeks ago;  pt states that the left shoulder is what he recently impacted on fall     Subjective: Pt presents for an OV with complaints of left shoulder pain  of 3 weeks duration that began after a fall. He reports he slipped on the ice and fell on his left shoulder.  About a year ago he stepped in a hole and fell against his left shoulder at that time as well. Since his fall 3 weeks ago he has had intermittent pain randomly anterior superior portion of his shoulder.  He noticed when he took his left arm and crossed it or reached across to his body he felt a mild discomfort that he explains as a "wave "that starts in the anterior portion of her shoulder and moves through  shoulder joint.  He denies any swelling,  bruising or erythema to the shoulder.  He reports pain is when he crosses his arm over his body to reach or when he lays on his left shoulder and crosses his arm over his body.  He reports he has not noticed any decrease in muscle strength, paresthesias, radiating pain or pain when reaching above his head.  He is a physically active 87 year old male who exercises with Silver sneakers routinely throughout the week.  He thought he may have damaged his pacemaker during his fall and has on his cardiology for this concern yesterday.  They felt the pain was more from his shoulder and recommended follow-up with PCP.  He has only fell against his shoulder 1 year ago, other than that and since he has had no shoulder injury or surgeries in the past.   Depression screen [facility] 2/9 05/02/2020 11/11/2018 11/03/2017 09/30/2016  Decreased Interest 0 0 0 0  Down, Depressed, Hopeless 0 0 0 0  PHQ - 2 Score 0 0 0 0  Some recent data might be hidden    Allergies  Allergen Reactions  . Penicillins Hives    Has patient had a PCN reaction causing immediate rash, facial/tongue/throat swelling, SOB or lightheadedness with hypotension: No Has patient had a PCN reaction causing severe rash involving mucus membranes or skin necrosis: Yes Has patient had a PCN reaction that required hospitalization: No Has patient had a PCN reaction occurring within the last  10 years: No If all of the above answers are "NO", then may proceed with Cephalosporin use.   . Vancomycin Other (See Comments)    Edema and myalgia   Social History   Social History Narrative   Married to [place], 2 children [name] and [name].   Some college. Retired from [employer].   Drinks caffeine, uses herbal remedies, takes a daily vitamin.   Wears his seatbelt, smoke detector at home, firearms in the home.   Wears a hearing aid.   Feels safe in her relationships.   Past Medical History:  Diagnosis Date  . A-fib ([place])   . Basal cell carcinoma    skin   . Bigeminal rhythm   . Bradycardia 06/21/2017  . Cancer of anterior wall of urinary bladder ([place]) 07/18/2019  . Cataract   . Chronic renal insufficiency, stage 3 (moderate) ([facility]) 2018   GFR 30s-40s  . Coronary artery disease    post bypass  . CVD (cardiovascular disease)   . Diabetes mellitus without complication ([place])    type 2 diet controlled  . Diverticular disease   . Easy bruising   . 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 Gout   . Hematuria 06/30/2019  . Hernia   . Hyperkalemia 05/06/2017  . Hyperlipidemia   . Hypertension   . IBS (irritable bowel syndrome)   . Iron deficiency anemia 02/04/2016  . Macular degeneration   . Microscopic colitis   . PVD (peripheral vascular disease) ([place])    Past Surgical History:  Procedure Laterality Date  . CARDIAC CATHETERIZATION  2006  . CARDIOVERSION N/A 02/22/2013   Procedure: CARDIOVERSION;  Surgeon: [name], MD;  Location: [facility] ENDOSCOPY;  Service: Cardiovascular;  Laterality: N/A;  . CARDIOVERSION N/A 11/15/2014   Procedure: CARDIOVERSION;  Surgeon: [name], MD;  Location: [facility] ENDOSCOPY;  Service: Cardiovascular;  Laterality: N/A;  . CARDIOVERSION N/A 04/01/2017   Procedure: CARDIOVERSION;  Surgeon: Larey Dresser, MD;  Location: [facility];  Service: Cardiovascular;  Laterality: N/A;  . CAROTID ENDARTERECTOMY  2009/ 1993   left/ right  . COLONOSCOPY  03/17/2005   The colon is normal.  . CORONARY ARTERY BYPASS GRAFT  1999  . CYSTOSCOPY W/ RETROGRADES Bilateral 07/14/2019   Procedure: CYSTOSCOPY WITH RETROGRADE PYELOGRAM;  Surgeon: Franchot Gallo, MD;  Location[facility];  Service: Urology;  Laterality: Bilateral;  . ESOPHAGOGASTRODUODENOSCOPY  03/17/2005   Normal esophagus. Normal Stomanch. Normal duodenum.   Marland Kitchen EYE SURGERY     eyelid repair  . INGUINAL HERNIA REPAIR  02/11/2012   Procedure: LAPAROSCOPIC BILATERAL INGUINAL HERNIA REPAIR;  Surgeon: Pedro Earls, MD;  Location: [facility] ORS;   Service: General;  Laterality: Bilateral;  . PACEMAKER IMPLANT N/A 07/28/2017   [employer] Jude [employer] Assurity MRI conditional  dual-chamber pacemaker for symptomatic second degree AV block by Dr Rayann Heman  . SKIN CANCER EXCISION     right ear x 3  . TRANSURETHRAL RESECTION OF BLADDER TUMOR N/A 09/05/2019   Procedure: TRANSURETHRAL RESECTION OF BLADDER TUMOR (TURBT);  Surgeon: Franchot Gallo, MD;  Location: [facility];  Service: Urology;  Laterality: N/A;  . TRANSURETHRAL RESECTION OF BLADDER TUMOR WITH MITOMYCIN-C N/A 07/14/2019   Procedure: TRANSURETHRAL RESECTION OF BLADDER TUMOR WITH GEMCITABINE IN PACU;  Surgeon: Franchot Gallo, MD;  Location: [facility];  Service: Urology;  Laterality: N/A;   Family History  Problem Relation Age of Onset  . Stroke Mother   . Coronary artery disease Father   .  Heart disease Father   . Melanoma Sister   . Colon cancer Neg Hx    Allergies as of 06/13/2020      Reactions   Penicillins Hives   Has patient had a PCN reaction causing immediate rash, facial/tongue/throat swelling, SOB or lightheadedness with hypotension: No Has patient had a PCN reaction causing severe rash involving mucus membranes or skin necrosis: Yes Has patient had a PCN reaction that required hospitalization: No Has patient had a PCN reaction occurring within the last 10 years: No If all of the above answers are "NO", then may proceed with Cephalosporin use.   Vancomycin Other (See Comments)   Edema and myalgia      Medication List       Accurate as of June 13, 2020 12:48 PM. If you have any questions, ask your nurse or doctor.        Accu-Chek FastClix Lancets Misc Check fasting blood sugar once daily   Accu-Chek Guide test strip Generic drug: glucose blood Check fasting blood sugar once daily   allopurinol 300 MG tablet Commonly known as: ZYLOPRIM Take 0.5 tablets (150 mg total) by mouth daily.   amLODipine 5 MG tablet Commonly  known as: NORVASC Take 1 tablet (5 mg total) by mouth daily.   atorvastatin 20 MG tablet Commonly known as: LIPITOR TAKE 1 TABLET AT BEDTIME   blood glucose meter kit and supplies Dispense based on patient and insurance preference. Use up to four times daily as directed. R73.03   carvedilol 6.25 MG tablet Commonly known as: COREG Take 1 tablet (6.25 mg total) by mouth 2 (two) times daily with a meal.   Cholecalciferol 125 MCG (5000 UT) capsule Take 5,000 Units by mouth daily.   CHROMIUM GTF PO Take 1 capsule by mouth every evening.   Cinnamon 500 MG Tabs Take 2 tablets by mouth at bedtime.   dicyclomine 10 MG capsule Commonly known as: BENTYL TAKE 1 CAPSULE (10 MG TOTAL) BY MOUTH 4 (FOUR) TIMES DAILY - BEFORE MEALS AND AT BEDTIME.   diphenoxylate-atropine 2.5-0.025 MG tablet Commonly known as: LOMOTIL 1 tablet. Pt takes PRN   Eliquis 2.5 MG Tabs tablet Generic drug: apixaban TAKE 1 TABLET TWICE A DAY   finasteride 5 MG tablet Commonly known as: PROSCAR Take 5 mg by mouth daily.   fish oil-omega-3 fatty acids 1000 MG capsule Take 1 g by mouth daily.   furosemide 20 MG tablet Commonly known as: LASIX Take 20 mg by mouth as needed.   latanoprost 0.005 % ophthalmic solution Commonly known as: XALATAN Place 1 drop into both eyes at bedtime.   potassium chloride 10 MEQ tablet Commonly known as: KLOR-CON Take 1 tablet (10 mEq total) by mouth daily.   PRESERVISION AREDS PO Take 1 tablet by mouth 2 (two) times daily.   PROBIOTIC PO Take 1 tablet by mouth daily.   tamsulosin 0.4 MG Caps capsule Commonly known as: FLOMAX Take 0.4 mg by mouth.   timolol 0.5 % ophthalmic solution Commonly known as: TIMOPTIC Place 1 drop into both eyes 2 (two) times daily.   UNABLE TO FIND Med Name: Mito Q   VANADYL SULFATE PO Take 1 capsule by mouth every evening.   zinc gluconate 50 MG tablet Take 50 mg by mouth daily.       All past medical history, surgical  history, allergies, family history, immunizations andmedications were updated in the EMR today and reviewed under the history and medication portions of their EMR.  ROS: Negative, with the exception of above mentioned in HPI   Objective:  BP (!) 183/52   Pulse 64   Temp 98 F (36.7 C) (Oral)   Ht 5' 8" (1.727 m)   Wt 155 lb (70.3 kg)   SpO2 98%   BMI 23.57 kg/m  Body mass index is 23.57 kg/m. Gen: Afebrile. No acute distress. Nontoxic in appearance, well developed, well nourished. Pleasant male.  HENT: AT. [place].  Eyes:Pupils Equal Round Reactive to light, Extraocular movements intact,  Conjunctiva without redness, discharge or icterus MSK: Shoulders: No erythema.  Mild tender to palpation over left and right septal grooves.  Left shoulder with moderate tender to palpation over [facility] joint-he reports this is the area he feels the most pain.  Full range of motion of bilateral arms and ADD duction and ABDuction.  Negative empty can test bilaterally.  Negative Hawkins.  Negative O'Brien's.  Neurovascularly intact distally.  Trigger finger right middle finger. Neuro: Normal gait. PERLA. EOMi. Alert. Oriented x3  No exam data present No results found. No results found for this or any previous visit (from the past 24 hour(s)).  Assessment/Plan: TALIK CASIQUE is a 87 y.o. male present for OV for  Acute pain of left shoulder Left shoulder pain seems to be located mostly over [facility] joint.  Concern for injury at this location.  Doubt any fracture considering he is doing fairly well with range of motion.  He is also mildly tender over the left bicep tendons insertion.  We discussed options today and elected to refer to sports medicine for evaluation and treatment.  We discussed possible steroid injection if appropriate and rehabilitation exercises.  He is sickly active and exercises with Silver sneakers a few times a week. - Ambulatory referral to Sports Medicine  Chronic right shoulder pain Right  shoulder is more of a chronic pain since fall in the past.  He is tender over bicipital groove.  May benefit from sports medicine rehabilitation recommendations.  Trigger middle finger of right hand Tender right palm with possible early signs of Dupuytren's contracture and trigger finger of middle finger.  May benefit from injection.   Reviewed expectations re: course of current medical issues.  Discussed self-management of symptoms.  Outlined signs and symptoms indicating need for more acute intervention.  Patient verbalized understanding and all questions were answered.  Patient received an After-Visit Summary.    Orders Placed This Encounter  Procedures  . Ambulatory referral to Sports Medicine   No orders of the defined types were placed in this encounter.   Referral Orders     Ambulatory referral to Sports Medicine   Note is dictated utilizing voice recognition software. Although note has been proof read prior to signing, occasional typographical errors still can be missed. If any questions arise, please do not hesitate to call for verification.   electronically signed by:  Howard Pouch, DO  [place]

## 2020-06-14 NOTE — Progress Notes (Signed)
Subjective:    CC: B shoulder pain, L >R  I, Molly Weber, LAT, ATC, am serving as scribe for Dr. Clementeen Graham.  HPI: Pt is a 86 y/o male presenting w/ c/o B shoulder pain, L >R.  His L shoulder is more acute, beginning approximately 3 weeks ago when he slipped on the ice and fell on his L shoulder.  He locates his pain to his L superior/anterior shoulder.  Of note, he does have a pacemaker and was concerned that he may have damaged it but cardiology has cleared him in terms of the pacemaker.  His R shoulder is also painful x 4-5 months after falling while out for a walk and landing on his R side.  He feels a tingling or liquid running sensation on his left lateral shoulder when his arm is in a crossover arm position laying on his left side.  He is worried he has a nerve issue or an electrical issue going on this area.  His pacemaker was checked out by cardiology as above leaving nerve issue is a possibility.  He denies any weakness in the left shoulder.  Radiating pain: yes in his R UE and into his L scapula L shoulder mechanical symptoms: intermittently yes on the R Aggravating factors: L shoulder horizontal aDd; laying on his L side; R shoulder overhead AROM above 120 deg; Treatments tried: Tylenol; Silver Sneakers workouts;  Pertinent review of Systems: No fevers or chills nausea vomiting or diarrhea.  Relevant historical information: Pacemaker.   Objective:    Vitals:   06/15/20 0918  BP: (!) 168/80  Pulse: 62  SpO2: 97%   General: Well Developed, well nourished, and in no acute distress.   MSK:  C-spine normal. Nontender. Normal cervical motion. Intact strength in upper extremity except noted below. Reflexes and sensation are intact distal bilateral upper cavities.  Right shoulder normal-appearing Nontender. Range of motion abduction 140 degrees.  External rotation full. Internal rotation lumbar spine. Strength intact abduction external and internal  rotation. Mildly positive Hawkins and Neer's test.  Negative empty can test Negative Yergason's and speeds test.  Left shoulder normal. Nontender. Normal sensation intact upper extremities including deltoid region. Range of motion full. Strength 4+/5 abduction.  5/5 external and internal rotation. Mildly positive Hawkins and Neer's test.  Negative empty can test. Negative Yergason's and speeds test.    Lab and Radiology Results  X-ray images bilateral shoulders obtained today personally and independently interpreted  Right shoulder: Moderate DJD glenohumeral joint and AC joint. No acute fractures.  Left shoulder: High riding humeral head.  No ear DJD.  Intact pacemaker.  Await formal radiology review  Diagnostic Limited MSK Ultrasound of: Left shoulder Biceps tendon intact normal-appearing Subscapularis tendon is intact. Supraspinatus tendon is intact. Infraspinatus tendon is intact. AC joint degenerative with effusion Impression: No rotator cuff tear.  AC DJD.  Diagnostic Limited MSK Ultrasound of: Right shoulder Subset intact normal-appearing Subscapularis tendon is intact. Supraspinatus tendon.  Hypoechoic structure present mid substance tendon indicating a partial intrasubstance tear without retraction.  This is small. Infraspinatus tendon it appears to be intact. AC joint degenerative with effusion. Impression: Supraspinatus partial-thickness intrasubstance tear without retraction.  AC DJD.   Impression and Recommendations:    Assessment and Plan: 87 y.o. male with  Bilateral shoulder pain after falls.  Right shoulder pain occurring several months ago after a fall.  Patient has evidence of partial rotator cuff tear in the right supraspinatus tendon.  Fortunately he does not  have much retraction or weakness.  He is a great candidate for physical therapy.  Referral placed to PT.  Recheck back in 6 weeks.  Left shoulder pain and paresthesias.  Paresthesias most  consistent with axillary nerve however patient does not have much weakness in the distribution of the axillary nerve.  Again he is a good candidate for PT.  Recheck back in 6 weeks.Marland Kitchen  PDMP not reviewed this encounter. Orders Placed This Encounter  Procedures  . [place] LIMITED JOINT SPACE STRUCTURES UP BILAT(NO LINKED CHARGES)    Order Specific Question:   Reason for Exam (SYMPTOM  OR DIAGNOSIS REQUIRED)    Answer:   B shoulder pain    Order Specific Question:   Preferred imaging location?    Answer:   Adult nurse [facility]  . DG Shoulder Left    Standing Status:   Future    Number of Occurrences:   1    Standing Expiration Date:   07/13/2020    Order Specific Question:   Reason for Exam (SYMPTOM  OR DIAGNOSIS REQUIRED)    Answer:   L shoulder pain    Order Specific Question:   Preferred imaging location?    Answer:   [facility]  . DG Shoulder Right    Standing Status:   Future    Number of Occurrences:   1    Standing Expiration Date:   07/13/2020    Order Specific Question:   Reason for Exam (SYMPTOM  OR DIAGNOSIS REQUIRED)    Answer:   R shoulder pain    Order Specific Question:   Preferred imaging location?    Answer:   [facility]  . Ambulatory referral to Physical Therapy    Referral Priority:   Routine    Referral Type:   Physical Medicine    Referral Reason:   Specialty Services Required    Requested Specialty:   Physical Therapy    Number of Visits Requested:   1   No orders of the defined types were placed in this encounter.   Discussed warning signs or symptoms. Please see discharge instructions. Patient expresses understanding.   The above documentation has been reviewed and is accurate and complete Clementeen Graham, M.D.

## 2020-06-15 ENCOUNTER — Ambulatory Visit: Payer: Self-pay

## 2020-06-15 ENCOUNTER — Other Ambulatory Visit: Payer: Self-pay

## 2020-06-15 ENCOUNTER — Ambulatory Visit (INDEPENDENT_AMBULATORY_CARE_PROVIDER_SITE_OTHER): Payer: Medicare Other

## 2020-06-15 ENCOUNTER — Encounter: Payer: Self-pay | Admitting: Family Medicine

## 2020-06-15 ENCOUNTER — Ambulatory Visit (INDEPENDENT_AMBULATORY_CARE_PROVIDER_SITE_OTHER): Payer: Medicare Other | Admitting: Family Medicine

## 2020-06-15 VITALS — BP 168/80 | HR 62 | Ht 68.0 in | Wt 157.4 lb

## 2020-06-15 DIAGNOSIS — M25511 Pain in right shoulder: Secondary | ICD-10-CM

## 2020-06-15 DIAGNOSIS — G8929 Other chronic pain: Secondary | ICD-10-CM | POA: Diagnosis not present

## 2020-06-15 DIAGNOSIS — I25708 Atherosclerosis of coronary artery bypass graft(s), unspecified, with other forms of angina pectoris: Secondary | ICD-10-CM

## 2020-06-15 DIAGNOSIS — M19012 Primary osteoarthritis, left shoulder: Secondary | ICD-10-CM | POA: Diagnosis not present

## 2020-06-15 DIAGNOSIS — L603 Nail dystrophy: Secondary | ICD-10-CM | POA: Diagnosis not present

## 2020-06-15 DIAGNOSIS — M25512 Pain in left shoulder: Secondary | ICD-10-CM | POA: Diagnosis not present

## 2020-06-15 DIAGNOSIS — L84 Corns and callosities: Secondary | ICD-10-CM | POA: Diagnosis not present

## 2020-06-15 DIAGNOSIS — I739 Peripheral vascular disease, unspecified: Secondary | ICD-10-CM | POA: Diagnosis not present

## 2020-06-15 DIAGNOSIS — E1151 Type 2 diabetes mellitus with diabetic peripheral angiopathy without gangrene: Secondary | ICD-10-CM | POA: Diagnosis not present

## 2020-06-15 DIAGNOSIS — M19011 Primary osteoarthritis, right shoulder: Secondary | ICD-10-CM | POA: Diagnosis not present

## 2020-06-15 NOTE — Patient Instructions (Signed)
Thank you for coming in today.  Please get an Xray today before you leave  I've referred you to Physical Therapy.  Let us know if you don't hear from them in one week.  Recheck in about 6 weeks.   Let me know if you are not doing well.

## 2020-06-18 NOTE — Progress Notes (Signed)
X-ray right shoulder shows some arthritis of the small joint of the top of the shoulder.  Additionally have some calcific tendinitis at the insertion of the rotator cuff tendon.  No fractures visible.

## 2020-06-18 NOTE — Progress Notes (Signed)
X-ray left shoulder shows some arthritis at the top of the shoulder.  No fractures visible.

## 2020-06-20 ENCOUNTER — Inpatient Hospital Stay: Payer: Medicare Other

## 2020-06-20 ENCOUNTER — Encounter: Payer: Self-pay | Admitting: Hematology & Oncology

## 2020-06-20 ENCOUNTER — Other Ambulatory Visit: Payer: Self-pay

## 2020-06-20 ENCOUNTER — Inpatient Hospital Stay (HOSPITAL_BASED_OUTPATIENT_CLINIC_OR_DEPARTMENT_OTHER): Payer: Medicare Other | Admitting: Hematology & Oncology

## 2020-06-20 ENCOUNTER — Inpatient Hospital Stay: Payer: Medicare Other | Attending: Hematology & Oncology

## 2020-06-20 VITALS — BP 163/65 | HR 75 | Temp 98.8°F | Resp 18 | Wt 158.0 lb

## 2020-06-20 DIAGNOSIS — I25708 Atherosclerosis of coronary artery bypass graft(s), unspecified, with other forms of angina pectoris: Secondary | ICD-10-CM

## 2020-06-20 DIAGNOSIS — D631 Anemia in chronic kidney disease: Secondary | ICD-10-CM

## 2020-06-20 DIAGNOSIS — D500 Iron deficiency anemia secondary to blood loss (chronic): Secondary | ICD-10-CM

## 2020-06-20 DIAGNOSIS — N189 Chronic kidney disease, unspecified: Secondary | ICD-10-CM | POA: Diagnosis not present

## 2020-06-20 DIAGNOSIS — D509 Iron deficiency anemia, unspecified: Secondary | ICD-10-CM | POA: Insufficient documentation

## 2020-06-20 LAB — CBC WITH DIFFERENTIAL (CANCER CENTER ONLY)
Abs Immature Granulocytes: 0.03 10*3/uL (ref 0.00–0.07)
Basophils Absolute: 0 10*3/uL (ref 0.0–0.1)
Basophils Relative: 0 %
Eosinophils Absolute: 0.1 10*3/uL (ref 0.0–0.5)
Eosinophils Relative: 2 %
HCT: 34.9 % — ABNORMAL LOW (ref 39.0–52.0)
Hemoglobin: 11.5 g/dL — ABNORMAL LOW (ref 13.0–17.0)
Immature Granulocytes: 0 %
Lymphocytes Relative: 23 %
Lymphs Abs: 1.7 10*3/uL (ref 0.7–4.0)
MCH: 30 pg (ref 26.0–34.0)
MCHC: 33 g/dL (ref 30.0–36.0)
MCV: 91.1 fL (ref 80.0–100.0)
Monocytes Absolute: 0.6 10*3/uL (ref 0.1–1.0)
Monocytes Relative: 9 %
Neutro Abs: 4.9 10*3/uL (ref 1.7–7.7)
Neutrophils Relative %: 66 %
Platelet Count: 127 10*3/uL — ABNORMAL LOW (ref 150–400)
RBC: 3.83 MIL/uL — ABNORMAL LOW (ref 4.22–5.81)
RDW: 14.9 % (ref 11.5–15.5)
WBC Count: 7.4 10*3/uL (ref 4.0–10.5)
nRBC: 0 % (ref 0.0–0.2)

## 2020-06-20 LAB — CMP (CANCER CENTER ONLY)
ALT: 31 U/L (ref 0–44)
AST: 30 U/L (ref 15–41)
Albumin: 4.1 g/dL (ref 3.5–5.0)
Alkaline Phosphatase: 64 U/L (ref 38–126)
Anion gap: 6 (ref 5–15)
BUN: 33 mg/dL — ABNORMAL HIGH (ref 8–23)
CO2: 27 mmol/L (ref 22–32)
Calcium: 9.1 mg/dL (ref 8.9–10.3)
Chloride: 107 mmol/L (ref 98–111)
Creatinine: 1.79 mg/dL — ABNORMAL HIGH (ref 0.61–1.24)
GFR, Estimated: 36 mL/min — ABNORMAL LOW (ref >60)
Glucose, Bld: 127 mg/dL — ABNORMAL HIGH (ref 70–99)
Potassium: 4.9 mmol/L (ref 3.5–5.1)
Sodium: 140 mmol/L (ref 135–145)
Total Bilirubin: 0.7 mg/dL (ref 0.3–1.2)
Total Protein: 6.7 g/dL (ref 6.5–8.1)

## 2020-06-20 LAB — RETICULOCYTES
Immature Retic Fract: 9.6 % (ref 2.3–15.9)
RBC.: 3.77 MIL/uL — ABNORMAL LOW (ref 4.22–5.81)
Retic Count, Absolute: 48.3 10*3/uL (ref 19.0–186.0)
Retic Ct Pct: 1.3 % (ref 0.4–3.1)

## 2020-06-20 NOTE — Progress Notes (Signed)
Hematology and Oncology Follow Up Visit  [name] [id number] 1928-07-25 87 y.o. 06/20/2020   Principle Diagnosis:  Erythropoietin deficiency anemia Iron deficiency anemia  Current Therapy:   IV iron as indicated -- feraheme given on 07/27/2019 Aranesp 300 g sq prn hemoglobin < 11 - last dose given on  07/18/2019   Interim History:  [name] is here today for follow-up.  Overall, he is doing fairly well.  I typically see him in church on Sundays.  Unfortunately, he has macular degeneration.  He is not able to do as much as he wants to.  He can no longer play golf.  We did his iron studies last time we saw him.  His ferritin was 446 with iron saturation of 29%.  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  He will have 3 treatments.  They will start on March 8.  He is not having any hematuria.  He is having no urinary pain.  His renal function is holding pretty steady right now.  He has had some leg swelling.  This is more so in the right leg than the left leg.   His appetite is good.   Overall, his performance status is ECOG 1.    Medications:  Allergies as of 06/20/2020      Reactions   Penicillins Hives   Has patient had a PCN reaction causing immediate rash, facial/tongue/throat swelling, SOB or lightheadedness with hypotension: No Has patient had a PCN reaction causing severe rash involving mucus membranes or skin necrosis: Yes Has patient had a PCN reaction that required hospitalization: No Has patient had a PCN reaction occurring within the last 10 years: No If all of the above answers are "NO", then may proceed with Cephalosporin use.   Vancomycin Other (See Comments)   Edema and myalgia      Medication List       Accurate as of June 20, 2020 12:42 PM. If you have any questions, ask your nurse or doctor.        Accu-Chek FastClix Lancets Misc Check fasting blood sugar  once daily   Accu-Chek Guide test strip Generic drug: glucose blood Check fasting blood sugar once daily   allopurinol 300 MG tablet Commonly known as: ZYLOPRIM Take 0.5 tablets (150 mg total) by mouth daily.   amLODipine 5 MG tablet Commonly known as: NORVASC Take 1 tablet (5 mg total) by mouth daily.   atorvastatin 20 MG tablet Commonly known as: LIPITOR TAKE 1 TABLET AT BEDTIME   blood glucose meter kit and supplies Dispense based on patient and insurance preference. Use up to four times daily as directed. R73.03   carvedilol 6.25 MG tablet Commonly known as: COREG Take 1 tablet (6.25 mg total) by mouth 2 (two) times daily with a meal.   Cholecalciferol 125 MCG (5000 UT) capsule Take 5,000 Units by mouth daily.   CHROMIUM GTF PO Take 1 capsule by mouth every evening.   Cinnamon 500 MG Tabs Take 2 tablets by mouth at bedtime.   dicyclomine 10 MG capsule Commonly known as: BENTYL TAKE 1 CAPSULE (10 MG TOTAL) BY MOUTH 4 (FOUR) TIMES DAILY - BEFORE MEALS AND AT BEDTIME.   diphenoxylate-atropine 2.5-0.025 MG tablet Commonly known as: LOMOTIL 1 tablet. Pt takes PRN   Eliquis 2.5 MG Tabs tablet Generic drug: apixaban TAKE 1 TABLET TWICE A DAY   finasteride 5 MG tablet Commonly known as: PROSCAR Take  5 mg by mouth daily.   fish oil-omega-3 fatty acids 1000 MG capsule Take 1 g by mouth daily.   furosemide 20 MG tablet Commonly known as: LASIX Take 20 mg by mouth as needed.   latanoprost 0.005 % ophthalmic solution Commonly known as: XALATAN Place 1 drop into both eyes at bedtime.   potassium chloride 10 MEQ tablet Commonly known as: KLOR-CON Take 1 tablet (10 mEq total) by mouth daily.   PRESERVISION AREDS PO Take 1 tablet by mouth 2 (two) times daily.   PROBIOTIC PO Take 1 tablet by mouth daily.   tamsulosin 0.4 MG Caps capsule Commonly known as: FLOMAX Take 0.4 mg by mouth.   timolol 0.5 % ophthalmic solution Commonly known as: TIMOPTIC Place 1  drop into both eyes 2 (two) times daily.   UNABLE TO FIND Med Name: Mito Q   VANADYL SULFATE PO Take 1 capsule by mouth every evening.   zinc gluconate 50 MG tablet Take 50 mg by mouth daily.       Allergies:  Allergies  Allergen Reactions  . Penicillins Hives    Has patient had a PCN reaction causing immediate rash, facial/tongue/throat swelling, SOB or lightheadedness with hypotension: No Has patient had a PCN reaction causing severe rash involving mucus membranes or skin necrosis: Yes Has patient had a PCN reaction that required hospitalization: No Has patient had a PCN reaction occurring within the last 10 years: No If all of the above answers are "NO", then may proceed with Cephalosporin use.   . Vancomycin Other (See Comments)    Edema and myalgia    Past Medical History, Surgical history, Social history, and Family History were reviewed and updated.  Review of Systems: Review of Systems  Constitutional: Negative.   HENT: Negative.   Eyes: Negative.   Respiratory: Negative.   Cardiovascular: Positive for palpitations.  Gastrointestinal: Negative.   Genitourinary: Negative.   Musculoskeletal: Negative.   Skin: Negative.   Neurological: Negative.   Endo/Heme/Allergies: Negative.   Psychiatric/Behavioral: Negative.      Physical Exam:  weight is 158 lb (71.7 kg). His oral temperature is 98.8 F (37.1 C). His blood pressure is 163/65 (abnormal) and his pulse is 75. His respiration is 18 and oxygen saturation is 99%.   Wt Readings from Last 3 Encounters:  06/20/20 158 lb (71.7 kg)  06/15/20 157 lb 6.4 oz (71.4 kg)  06/13/20 155 lb (70.3 kg)    Physical Exam Vitals reviewed.  HENT:     Head: Normocephalic and atraumatic.  Eyes:     Pupils: Pupils are equal, round, and reactive to light.  Cardiovascular:     Rate and Rhythm: Normal rate and regular rhythm.     Heart sounds: Normal heart sounds.  Pulmonary:     Effort: Pulmonary effort is normal.      Breath sounds: Normal breath sounds.  Abdominal:     General: Bowel sounds are normal.     Palpations: Abdomen is soft.  Musculoskeletal:        General: No tenderness or deformity. Normal range of motion.     Cervical back: Normal range of motion.  Lymphadenopathy:     Cervical: No cervical adenopathy.  Skin:    General: Skin is warm and dry.     Findings: No erythema or rash.  Neurological:     Mental Status: He is alert and oriented to person, place, and time.  Psychiatric:        Behavior: Behavior normal.  Thought Content: Thought content normal.        Judgment: Judgment normal.      Lab Results  Component Value Date   WBC 7.4 06/20/2020   HGB 11.5 (L) 06/20/2020   HCT 34.9 (L) 06/20/2020   MCV 91.1 06/20/2020   PLT 127 (L) 06/20/2020   Lab Results  Component Value Date   FERRITIN 446 (H) 05/09/2020   IRON 78 05/09/2020   TIBC 265 05/09/2020   UIBC 187 05/09/2020   IRONPCTSAT 29 05/09/2020   Lab Results  Component Value Date   RETICCTPCT 1.3 06/20/2020   RBC 3.83 (L) 06/20/2020   RBC 3.77 (L) 06/20/2020   No results found for: KPAFRELGTCHN, LAMBDASER, KAPLAMBRATIO No results found for: Kandis Cocking, IGMSERUM Lab Results  Component Value Date   TOTALPROTELP 6.0 (L) 12/26/2015   ALBUMINELP 3.4 (L) 12/26/2015   A1GS 0.4 (H) 12/26/2015   A2GS 0.8 12/26/2015   BETS 0.4 12/26/2015   BETA2SER 0.3 12/26/2015   GAMS 0.8 12/26/2015   MSPIKE Not Observed 01/29/2016   SPEI SEE NOTE 12/26/2015     Chemistry      Component Value Date/Time   NA 140 06/20/2020 1200   NA 141 08/11/2017 0924   NA 145 04/09/2017 0905   NA 140 03/28/2016 0853   K 4.9 06/20/2020 1200   K 4.5 04/09/2017 0905   K 4.4 03/28/2016 0853   CL 107 06/20/2020 1200   CL 105 04/09/2017 0905   CO2 27 06/20/2020 1200   CO2 27 04/09/2017 0905   CO2 19 (L) 03/28/2016 0853   BUN 33 (H) 06/20/2020 1200   BUN 27 08/11/2017 0924   BUN 33 (H) 04/09/2017 0905   BUN 34.4 (H) 03/28/2016  0853   CREATININE 1.79 (H) 06/20/2020 1200   CREATININE 1.72 (H) 08/18/2017 1543   CREATININE 1.7 (H) 03/28/2016 0853   GLU 113 07/24/2016 0000      Component Value Date/Time   CALCIUM 9.1 06/20/2020 1200   CALCIUM 9.2 04/09/2017 0905   CALCIUM 9.2 03/28/2016 0853   ALKPHOS 64 06/20/2020 1200   ALKPHOS 69 04/09/2017 0905   ALKPHOS 85 03/28/2016 0853   AST 30 06/20/2020 1200   AST 23 03/28/2016 0853   ALT 31 06/20/2020 1200   ALT 27 04/09/2017 0905   ALT 37 03/28/2016 0853   BILITOT 0.7 06/20/2020 1200   BILITOT 0.79 03/28/2016 0853      Impression and Plan: [name] is a very pleasant 87 yo caucasian gentleman with both erythropoietin deficiency and iron deficiency anemia.   I would be surprised if his iron level is low this time.  However, given that he does have the bladder cancer, there may be some microscopic bleeding.  We will plan to see him back after he has the intravesicular therapy.  We will plan to get him back in 6 weeks.  [name], MD 2/23/202212:42 PM

## 2020-06-21 LAB — IRON AND TIBC
Iron: 82 ug/dL (ref 42–163)
Saturation Ratios: 31 % (ref 20–55)
TIBC: 269 ug/dL (ref 202–409)
UIBC: 187 ug/dL (ref 117–376)

## 2020-06-21 LAB — FERRITIN: Ferritin: 366 ng/mL — ABNORMAL HIGH (ref 24–336)

## 2020-06-25 DIAGNOSIS — Z85828 Personal history of other malignant neoplasm of skin: Secondary | ICD-10-CM | POA: Diagnosis not present

## 2020-06-25 DIAGNOSIS — C44329 Squamous cell carcinoma of skin of other parts of face: Secondary | ICD-10-CM | POA: Diagnosis not present

## 2020-07-02 DIAGNOSIS — L570 Actinic keratosis: Secondary | ICD-10-CM | POA: Diagnosis not present

## 2020-07-03 DIAGNOSIS — Z5111 Encounter for antineoplastic chemotherapy: Secondary | ICD-10-CM | POA: Diagnosis not present

## 2020-07-03 DIAGNOSIS — C678 Malignant neoplasm of overlapping sites of bladder: Secondary | ICD-10-CM | POA: Diagnosis not present

## 2020-07-10 DIAGNOSIS — M25511 Pain in right shoulder: Secondary | ICD-10-CM | POA: Diagnosis not present

## 2020-07-10 DIAGNOSIS — R8279 Other abnormal findings on microbiological examination of urine: Secondary | ICD-10-CM | POA: Diagnosis not present

## 2020-07-10 DIAGNOSIS — M25512 Pain in left shoulder: Secondary | ICD-10-CM | POA: Diagnosis not present

## 2020-07-11 DIAGNOSIS — C678 Malignant neoplasm of overlapping sites of bladder: Secondary | ICD-10-CM | POA: Diagnosis not present

## 2020-07-11 DIAGNOSIS — R310 Gross hematuria: Secondary | ICD-10-CM | POA: Diagnosis not present

## 2020-07-17 DIAGNOSIS — Z5111 Encounter for antineoplastic chemotherapy: Secondary | ICD-10-CM | POA: Diagnosis not present

## 2020-07-17 DIAGNOSIS — C678 Malignant neoplasm of overlapping sites of bladder: Secondary | ICD-10-CM | POA: Diagnosis not present

## 2020-07-19 DIAGNOSIS — M25511 Pain in right shoulder: Secondary | ICD-10-CM | POA: Diagnosis not present

## 2020-07-19 DIAGNOSIS — M25512 Pain in left shoulder: Secondary | ICD-10-CM | POA: Diagnosis not present

## 2020-07-26 DIAGNOSIS — M25511 Pain in right shoulder: Secondary | ICD-10-CM | POA: Diagnosis not present

## 2020-07-26 DIAGNOSIS — M25512 Pain in left shoulder: Secondary | ICD-10-CM | POA: Diagnosis not present

## 2020-07-27 DIAGNOSIS — I129 Hypertensive chronic kidney disease with stage 1 through stage 4 chronic kidney disease, or unspecified chronic kidney disease: Secondary | ICD-10-CM | POA: Diagnosis not present

## 2020-07-27 DIAGNOSIS — I4891 Unspecified atrial fibrillation: Secondary | ICD-10-CM | POA: Diagnosis not present

## 2020-07-27 DIAGNOSIS — N183 Chronic kidney disease, stage 3 unspecified: Secondary | ICD-10-CM | POA: Diagnosis not present

## 2020-07-27 DIAGNOSIS — D472 Monoclonal gammopathy: Secondary | ICD-10-CM | POA: Diagnosis not present

## 2020-07-31 ENCOUNTER — Ambulatory Visit (INDEPENDENT_AMBULATORY_CARE_PROVIDER_SITE_OTHER): Payer: Medicare Other

## 2020-07-31 DIAGNOSIS — I441 Atrioventricular block, second degree: Secondary | ICD-10-CM

## 2020-07-31 LAB — CUP PACEART REMOTE DEVICE CHECK
Battery Remaining Longevity: 119 mo
Battery Remaining Percentage: 95.5 %
Battery Voltage: 2.99 V
Brady Statistic RV Percent Paced: 99 %
Date Time Interrogation Session: 20220405020014
Implantable Lead Implant Date: 20190402
Implantable Lead Implant Date: 20190402
Implantable Lead Location: 753859
Implantable Lead Location: 753860
Implantable Pulse Generator Implant Date: 20190402
Lead Channel Impedance Value: 460 Ohm
Lead Channel Pacing Threshold Amplitude: 0.75 V
Lead Channel Pacing Threshold Pulse Width: 0.5 ms
Lead Channel Sensing Intrinsic Amplitude: 12 mV
Lead Channel Setting Pacing Amplitude: 2.5 V
Lead Channel Setting Pacing Pulse Width: 0.5 ms
Lead Channel Setting Sensing Sensitivity: 2 mV
Pulse Gen Model: 2272
Pulse Gen Serial Number: 9003454

## 2020-08-01 ENCOUNTER — Telehealth: Payer: Self-pay

## 2020-08-01 NOTE — Telephone Encounter (Signed)
pts wife called to r/s his 08/02/20 appt due to aw conflict   [name]

## 2020-08-02 ENCOUNTER — Inpatient Hospital Stay: Payer: Medicare Other | Admitting: Hematology & Oncology

## 2020-08-02 ENCOUNTER — Inpatient Hospital Stay: Payer: Medicare Other

## 2020-08-02 DIAGNOSIS — M25511 Pain in right shoulder: Secondary | ICD-10-CM | POA: Diagnosis not present

## 2020-08-02 DIAGNOSIS — M25512 Pain in left shoulder: Secondary | ICD-10-CM | POA: Diagnosis not present

## 2020-08-08 DIAGNOSIS — H401134 Primary open-angle glaucoma, bilateral, indeterminate stage: Secondary | ICD-10-CM | POA: Diagnosis not present

## 2020-08-08 DIAGNOSIS — H43813 Vitreous degeneration, bilateral: Secondary | ICD-10-CM | POA: Diagnosis not present

## 2020-08-08 DIAGNOSIS — H353134 Nonexudative age-related macular degeneration, bilateral, advanced atrophic with subfoveal involvement: Secondary | ICD-10-CM | POA: Diagnosis not present

## 2020-08-08 DIAGNOSIS — H02105 Unspecified ectropion of left lower eyelid: Secondary | ICD-10-CM | POA: Diagnosis not present

## 2020-08-09 DIAGNOSIS — M25511 Pain in right shoulder: Secondary | ICD-10-CM | POA: Diagnosis not present

## 2020-08-09 DIAGNOSIS — M25512 Pain in left shoulder: Secondary | ICD-10-CM | POA: Diagnosis not present

## 2020-08-10 ENCOUNTER — Other Ambulatory Visit: Payer: Self-pay

## 2020-08-10 ENCOUNTER — Inpatient Hospital Stay: Payer: Medicare Other | Attending: Hematology & Oncology

## 2020-08-10 ENCOUNTER — Inpatient Hospital Stay (HOSPITAL_BASED_OUTPATIENT_CLINIC_OR_DEPARTMENT_OTHER): Payer: Medicare Other | Admitting: Hematology & Oncology

## 2020-08-10 ENCOUNTER — Encounter: Payer: Self-pay | Admitting: Hematology & Oncology

## 2020-08-10 ENCOUNTER — Inpatient Hospital Stay: Payer: Medicare Other

## 2020-08-10 VITALS — BP 168/54 | HR 65 | Temp 98.4°F | Resp 18 | Wt 159.0 lb

## 2020-08-10 DIAGNOSIS — D508 Other iron deficiency anemias: Secondary | ICD-10-CM

## 2020-08-10 DIAGNOSIS — D500 Iron deficiency anemia secondary to blood loss (chronic): Secondary | ICD-10-CM

## 2020-08-10 DIAGNOSIS — D631 Anemia in chronic kidney disease: Secondary | ICD-10-CM

## 2020-08-10 DIAGNOSIS — D509 Iron deficiency anemia, unspecified: Secondary | ICD-10-CM

## 2020-08-10 DIAGNOSIS — N189 Chronic kidney disease, unspecified: Secondary | ICD-10-CM | POA: Insufficient documentation

## 2020-08-10 DIAGNOSIS — I25708 Atherosclerosis of coronary artery bypass graft(s), unspecified, with other forms of angina pectoris: Secondary | ICD-10-CM | POA: Diagnosis not present

## 2020-08-10 DIAGNOSIS — Z79899 Other long term (current) drug therapy: Secondary | ICD-10-CM | POA: Diagnosis not present

## 2020-08-10 LAB — CBC WITH DIFFERENTIAL (CANCER CENTER ONLY)
Abs Immature Granulocytes: 0.06 10*3/uL (ref 0.00–0.07)
Basophils Absolute: 0 10*3/uL (ref 0.0–0.1)
Basophils Relative: 0 %
Eosinophils Absolute: 0.2 10*3/uL (ref 0.0–0.5)
Eosinophils Relative: 3 %
HCT: 32.3 % — ABNORMAL LOW (ref 39.0–52.0)
Hemoglobin: 10.7 g/dL — ABNORMAL LOW (ref 13.0–17.0)
Immature Granulocytes: 1 %
Lymphocytes Relative: 22 %
Lymphs Abs: 1.4 10*3/uL (ref 0.7–4.0)
MCH: 30.4 pg (ref 26.0–34.0)
MCHC: 33.1 g/dL (ref 30.0–36.0)
MCV: 91.8 fL (ref 80.0–100.0)
Monocytes Absolute: 0.7 10*3/uL (ref 0.1–1.0)
Monocytes Relative: 11 %
Neutro Abs: 4 10*3/uL (ref 1.7–7.7)
Neutrophils Relative %: 63 %
Platelet Count: 144 10*3/uL — ABNORMAL LOW (ref 150–400)
RBC: 3.52 MIL/uL — ABNORMAL LOW (ref 4.22–5.81)
RDW: 14.9 % (ref 11.5–15.5)
WBC Count: 6.4 10*3/uL (ref 4.0–10.5)
nRBC: 0 % (ref 0.0–0.2)

## 2020-08-10 LAB — CMP (CANCER CENTER ONLY)
ALT: 29 U/L (ref 0–44)
AST: 31 U/L (ref 15–41)
Albumin: 4 g/dL (ref 3.5–5.0)
Alkaline Phosphatase: 59 U/L (ref 38–126)
Anion gap: 8 (ref 5–15)
BUN: 35 mg/dL — ABNORMAL HIGH (ref 8–23)
CO2: 27 mmol/L (ref 22–32)
Calcium: 9.1 mg/dL (ref 8.9–10.3)
Chloride: 106 mmol/L (ref 98–111)
Creatinine: 1.72 mg/dL — ABNORMAL HIGH (ref 0.61–1.24)
GFR, Estimated: 37 mL/min — ABNORMAL LOW (ref >60)
Glucose, Bld: 188 mg/dL — ABNORMAL HIGH (ref 70–99)
Potassium: 4.5 mmol/L (ref 3.5–5.1)
Sodium: 141 mmol/L (ref 135–145)
Total Bilirubin: 0.6 mg/dL (ref 0.3–1.2)
Total Protein: 6.5 g/dL (ref 6.5–8.1)

## 2020-08-10 LAB — IRON AND TIBC
Iron: 74 ug/dL (ref 42–163)
Saturation Ratios: 27 % (ref 20–55)
TIBC: 274 ug/dL (ref 202–409)
UIBC: 199 ug/dL (ref 117–376)

## 2020-08-10 LAB — RETICULOCYTES
Immature Retic Fract: 11.5 % (ref 2.3–15.9)
RBC.: 3.53 MIL/uL — ABNORMAL LOW (ref 4.22–5.81)
Retic Count, Absolute: 50.5 10*3/uL (ref 19.0–186.0)
Retic Ct Pct: 1.4 % (ref 0.4–3.1)

## 2020-08-10 LAB — FERRITIN: Ferritin: 450 ng/mL — ABNORMAL HIGH (ref 24–336)

## 2020-08-10 MED ORDER — DARBEPOETIN ALFA 300 MCG/0.6ML IJ SOSY
PREFILLED_SYRINGE | INTRAMUSCULAR | Status: AC
  Filled 2020-08-10: qty 0.6

## 2020-08-10 MED ORDER — DARBEPOETIN ALFA 300 MCG/0.6ML IJ SOSY
300.0000 ug | PREFILLED_SYRINGE | Freq: Once | INTRAMUSCULAR | Status: AC
  Administered 2020-08-10: 300 ug via SUBCUTANEOUS

## 2020-08-10 NOTE — Progress Notes (Signed)
Hematology and Oncology Follow Up Visit  [name] [id number] 1929-02-19 87 y.o. 08/10/2020   Principle Diagnosis:  Erythropoietin deficiency anemia Iron deficiency anemia  Current Therapy:   IV iron as indicated -- feraheme given on 07/27/2019 Aranesp 300 g sq prn hemoglobin < 11 - last dose given on  08/10/2020   Interim History:  [name] is here today for follow-up.  Overall, he is doing fairly well.  I typically see him in church on Sundays.  Unfortunately, he has macular degeneration.  He is not able to drive.  This really has affected his quality of life.  I does feel bad for him.  His wife brought him today.  He has not had Aranesp for about 3 months.  We will going give him a dose today as his hemoglobin is less than 11.  His superficial bladder cancer seems to be doing pretty well.  He had his last treatment back in March.  He sees his urologist in May I think.  I am sure that the bladder cancer hopefully has regressed.  He has had no bleeding.  There is been no problems with nausea or vomiting.  He has had no cough or shortness of breath.  Overall, his performance status is ECOG 1.  His renal function seems to be getting a little bit better.  He says little bit of swelling in the right leg which she has always had.     Medications:  Allergies as of 08/10/2020      Reactions   Penicillins Hives   Has patient had a PCN reaction causing immediate rash, facial/tongue/throat swelling, SOB or lightheadedness with hypotension: No Has patient had a PCN reaction causing severe rash involving mucus membranes or skin necrosis: Yes Has patient had a PCN reaction that required hospitalization: No Has patient had a PCN reaction occurring within the last 10 years: No If all of the above answers are "NO", then may proceed with Cephalosporin use.   Vancomycin Other (See Comments)   Edema and myalgia      Medication List       Accurate as of August 10, 2020  9:58 AM.  If you have any questions, ask your nurse or doctor.        Accu-Chek FastClix Lancets Misc Check fasting blood sugar once daily   Accu-Chek Guide test strip Generic drug: glucose blood Check fasting blood sugar once daily   allopurinol 300 MG tablet Commonly known as: ZYLOPRIM Take 0.5 tablets (150 mg total) by mouth daily.   amLODipine 5 MG tablet Commonly known as: NORVASC Take 1 tablet (5 mg total) by mouth daily.   atorvastatin 20 MG tablet Commonly known as: LIPITOR TAKE 1 TABLET AT BEDTIME   blood glucose meter kit and supplies Dispense based on patient and insurance preference. Use up to four times daily as directed. R73.03   carvedilol 6.25 MG tablet Commonly known as: COREG Take 1 tablet (6.25 mg total) by mouth 2 (two) times daily with a meal.   Cholecalciferol 125 MCG (5000 UT) capsule Take 5,000 Units by mouth daily.   CHROMIUM GTF PO Take 1 capsule by mouth every evening.   Cinnamon 500 MG Tabs Take 2 tablets by mouth at bedtime.   dicyclomine 10 MG capsule Commonly known as: BENTYL TAKE 1 CAPSULE (10 MG TOTAL) BY MOUTH 4 (FOUR) TIMES DAILY - BEFORE MEALS AND AT BEDTIME.   diphenoxylate-atropine 2.5-0.025 MG tablet Commonly known as: LOMOTIL 1 tablet. Pt takes PRN  Eliquis 2.5 MG Tabs tablet Generic drug: apixaban TAKE 1 TABLET TWICE A DAY   finasteride 5 MG tablet Commonly known as: PROSCAR Take 5 mg by mouth daily.   fish oil-omega-3 fatty acids 1000 MG capsule Take 1 g by mouth daily.   furosemide 20 MG tablet Commonly known as: LASIX Take 20 mg by mouth as needed.   latanoprost 0.005 % ophthalmic solution Commonly known as: XALATAN Place 1 drop into both eyes at bedtime.   potassium chloride 10 MEQ tablet Commonly known as: KLOR-CON Take 1 tablet (10 mEq total) by mouth daily.   PRESERVISION AREDS PO Take 1 tablet by mouth 2 (two) times daily.   PROBIOTIC PO Take 1 tablet by mouth daily.   tamsulosin 0.4 MG Caps  capsule Commonly known as: FLOMAX Take 0.4 mg by mouth.   timolol 0.5 % ophthalmic solution Commonly known as: TIMOPTIC Place 1 drop into both eyes 2 (two) times daily.   UNABLE TO FIND Med Name: Mito Q   VANADYL SULFATE PO Take 1 capsule by mouth every evening.   zinc gluconate 50 MG tablet Take 50 mg by mouth daily.       Allergies:  Allergies  Allergen Reactions  . Penicillins Hives    Has patient had a PCN reaction causing immediate rash, facial/tongue/throat swelling, SOB or lightheadedness with hypotension: No Has patient had a PCN reaction causing severe rash involving mucus membranes or skin necrosis: Yes Has patient had a PCN reaction that required hospitalization: No Has patient had a PCN reaction occurring within the last 10 years: No If all of the above answers are "NO", then may proceed with Cephalosporin use.   . Vancomycin Other (See Comments)    Edema and myalgia    Past Medical History, Surgical history, Social history, and Family History were reviewed and updated.  Review of Systems: Review of Systems  Constitutional: Negative.   HENT: Negative.   Eyes: Negative.   Respiratory: Negative.   Cardiovascular: Positive for palpitations.  Gastrointestinal: Negative.   Genitourinary: Negative.   Musculoskeletal: Negative.   Skin: Negative.   Neurological: Negative.   Endo/Heme/Allergies: Negative.   Psychiatric/Behavioral: Negative.      Physical Exam:  weight is 159 lb (72.1 kg). His oral temperature is 98.4 F (36.9 C). His blood pressure is 168/54 (abnormal) and his pulse is 65. His respiration is 18 and oxygen saturation is 94%.   Wt Readings from Last 3 Encounters:  08/10/20 159 lb (72.1 kg)  06/20/20 158 lb (71.7 kg)  06/15/20 157 lb 6.4 oz (71.4 kg)    Physical Exam Vitals reviewed.  HENT:     Head: Normocephalic and atraumatic.  Eyes:     Pupils: Pupils are equal, round, and reactive to light.  Cardiovascular:     Rate and  Rhythm: Normal rate and regular rhythm.     Heart sounds: Normal heart sounds.  Pulmonary:     Effort: Pulmonary effort is normal.     Breath sounds: Normal breath sounds.  Abdominal:     General: Bowel sounds are normal.     Palpations: Abdomen is soft.  Musculoskeletal:        General: No tenderness or deformity. Normal range of motion.     Cervical back: Normal range of motion.  Lymphadenopathy:     Cervical: No cervical adenopathy.  Skin:    General: Skin is warm and dry.     Findings: No erythema or rash.  Neurological:  Mental Status: He is alert and oriented to person, place, and time.  Psychiatric:        Behavior: Behavior normal.        Thought Content: Thought content normal.        Judgment: Judgment normal.      Lab Results  Component Value Date   WBC 6.4 08/10/2020   HGB 10.7 (L) 08/10/2020   HCT 32.3 (L) 08/10/2020   MCV 91.8 08/10/2020   PLT 144 (L) 08/10/2020   Lab Results  Component Value Date   FERRITIN 366 (H) 06/20/2020   IRON 82 06/20/2020   TIBC 269 06/20/2020   UIBC 187 06/20/2020   IRONPCTSAT 31 06/20/2020   Lab Results  Component Value Date   RETICCTPCT 1.4 08/10/2020   RBC 3.53 (L) 08/10/2020   RBC 3.52 (L) 08/10/2020   No results found for: KPAFRELGTCHN, LAMBDASER, KAPLAMBRATIO No results found for: Kandis Cocking, IGMSERUM Lab Results  Component Value Date   TOTALPROTELP 6.0 (L) 12/26/2015   ALBUMINELP 3.4 (L) 12/26/2015   A1GS 0.4 (H) 12/26/2015   A2GS 0.8 12/26/2015   BETS 0.4 12/26/2015   BETA2SER 0.3 12/26/2015   GAMS 0.8 12/26/2015   MSPIKE Not Observed 01/29/2016   SPEI SEE NOTE 12/26/2015     Chemistry      Component Value Date/Time   NA 141 08/10/2020 0836   NA 141 08/11/2017 0924   NA 145 04/09/2017 0905   NA 140 03/28/2016 0853   K 4.5 08/10/2020 0836   K 4.5 04/09/2017 0905   K 4.4 03/28/2016 0853   CL 106 08/10/2020 0836   CL 105 04/09/2017 0905   CO2 27 08/10/2020 0836   CO2 27 04/09/2017 0905    CO2 19 (L) 03/28/2016 0853   BUN 35 (H) 08/10/2020 0836   BUN 27 08/11/2017 0924   BUN 33 (H) 04/09/2017 0905   BUN 34.4 (H) 03/28/2016 0853   CREATININE 1.72 (H) 08/10/2020 0836   CREATININE 1.72 (H) 08/18/2017 1543   CREATININE 1.7 (H) 03/28/2016 0853   GLU 113 07/24/2016 0000      Component Value Date/Time   CALCIUM 9.1 08/10/2020 0836   CALCIUM 9.2 04/09/2017 0905   CALCIUM 9.2 03/28/2016 0853   ALKPHOS 59 08/10/2020 0836   ALKPHOS 69 04/09/2017 0905   ALKPHOS 85 03/28/2016 0853   AST 31 08/10/2020 0836   AST 23 03/28/2016 0853   ALT 29 08/10/2020 0836   ALT 27 04/09/2017 0905   ALT 37 03/28/2016 0853   BILITOT 0.6 08/10/2020 0836   BILITOT 0.79 03/28/2016 0853      Impression and Plan: Mr. Miron is a very pleasant 87 yo caucasian gentleman with both erythropoietin deficiency and iron deficiency anemia.  We will go ahead and give a dose of Aranesp today.  We will see what his iron studies look like.  I would be surprised if they are low.  I think we can now get him back after [facility].  He really has done nicely.   [name], MD 4/15/20229:58 AM

## 2020-08-10 NOTE — Progress Notes (Signed)
Remote pacemaker transmission.   

## 2020-08-13 ENCOUNTER — Telehealth: Payer: Self-pay

## 2020-08-13 DIAGNOSIS — C678 Malignant neoplasm of overlapping sites of bladder: Secondary | ICD-10-CM | POA: Diagnosis not present

## 2020-08-13 NOTE — Telephone Encounter (Signed)
S/w pts wife and she is aware of the f/u visit per 08/10/20 los   [employer]

## 2020-08-14 ENCOUNTER — Other Ambulatory Visit: Payer: Self-pay

## 2020-08-14 MED ORDER — ACCU-CHEK GUIDE VI STRP: ORAL_STRIP | 3 refills | Status: DC

## 2020-08-24 DIAGNOSIS — M25511 Pain in right shoulder: Secondary | ICD-10-CM | POA: Diagnosis not present

## 2020-08-24 DIAGNOSIS — M25512 Pain in left shoulder: Secondary | ICD-10-CM | POA: Diagnosis not present

## 2020-08-27 ENCOUNTER — Other Ambulatory Visit: Payer: Self-pay | Admitting: Cardiovascular Disease

## 2020-08-27 DIAGNOSIS — I4819 Other persistent atrial fibrillation: Secondary | ICD-10-CM

## 2020-08-27 NOTE — Telephone Encounter (Signed)
Prescription refill request for Eliquis received. Indication: afib  Last office visit: Lynnell Jude 06/12/2020 Scr: 1.72, 08/10/2020 Age: 87 yo  Weight: 72.1 kg   Pt is on the correct dose of Eliquis per dosing criteria, prescription refill sent for Eliquis 2.5mg  BID.

## 2020-08-29 DIAGNOSIS — M25512 Pain in left shoulder: Secondary | ICD-10-CM | POA: Diagnosis not present

## 2020-08-29 DIAGNOSIS — M25511 Pain in right shoulder: Secondary | ICD-10-CM | POA: Diagnosis not present

## 2020-09-14 DIAGNOSIS — L84 Corns and callosities: Secondary | ICD-10-CM | POA: Diagnosis not present

## 2020-09-14 DIAGNOSIS — L603 Nail dystrophy: Secondary | ICD-10-CM | POA: Diagnosis not present

## 2020-09-14 DIAGNOSIS — I739 Peripheral vascular disease, unspecified: Secondary | ICD-10-CM | POA: Diagnosis not present

## 2020-09-14 DIAGNOSIS — E1151 Type 2 diabetes mellitus with diabetic peripheral angiopathy without gangrene: Secondary | ICD-10-CM | POA: Diagnosis not present

## 2020-10-05 ENCOUNTER — Inpatient Hospital Stay: Payer: Medicare Other

## 2020-10-05 ENCOUNTER — Other Ambulatory Visit: Payer: Self-pay

## 2020-10-05 ENCOUNTER — Telehealth: Payer: Self-pay

## 2020-10-05 ENCOUNTER — Inpatient Hospital Stay: Payer: Medicare Other | Attending: Hematology & Oncology

## 2020-10-05 ENCOUNTER — Inpatient Hospital Stay (HOSPITAL_BASED_OUTPATIENT_CLINIC_OR_DEPARTMENT_OTHER): Payer: Medicare Other | Admitting: Hematology & Oncology

## 2020-10-05 ENCOUNTER — Encounter: Payer: Self-pay | Admitting: Hematology & Oncology

## 2020-10-05 VITALS — BP 152/56 | HR 73 | Temp 98.2°F | Resp 18 | Wt 155.0 lb

## 2020-10-05 DIAGNOSIS — D500 Iron deficiency anemia secondary to blood loss (chronic): Secondary | ICD-10-CM

## 2020-10-05 DIAGNOSIS — Z79899 Other long term (current) drug therapy: Secondary | ICD-10-CM | POA: Insufficient documentation

## 2020-10-05 DIAGNOSIS — D509 Iron deficiency anemia, unspecified: Secondary | ICD-10-CM | POA: Insufficient documentation

## 2020-10-05 DIAGNOSIS — D631 Anemia in chronic kidney disease: Secondary | ICD-10-CM | POA: Insufficient documentation

## 2020-10-05 DIAGNOSIS — N189 Chronic kidney disease, unspecified: Secondary | ICD-10-CM | POA: Diagnosis not present

## 2020-10-05 LAB — CBC WITH DIFFERENTIAL (CANCER CENTER ONLY)
Abs Immature Granulocytes: 0.02 10*3/uL (ref 0.00–0.07)
Basophils Absolute: 0 10*3/uL (ref 0.0–0.1)
Basophils Relative: 0 %
Eosinophils Absolute: 0.2 10*3/uL (ref 0.0–0.5)
Eosinophils Relative: 3 %
HCT: 34 % — ABNORMAL LOW (ref 39.0–52.0)
Hemoglobin: 11.3 g/dL — ABNORMAL LOW (ref 13.0–17.0)
Immature Granulocytes: 0 %
Lymphocytes Relative: 27 %
Lymphs Abs: 1.5 10*3/uL (ref 0.7–4.0)
MCH: 30.2 pg (ref 26.0–34.0)
MCHC: 33.2 g/dL (ref 30.0–36.0)
MCV: 90.9 fL (ref 80.0–100.0)
Monocytes Absolute: 0.5 10*3/uL (ref 0.1–1.0)
Monocytes Relative: 10 %
Neutro Abs: 3.4 10*3/uL (ref 1.7–7.7)
Neutrophils Relative %: 60 %
Platelet Count: 120 10*3/uL — ABNORMAL LOW (ref 150–400)
RBC: 3.74 MIL/uL — ABNORMAL LOW (ref 4.22–5.81)
RDW: 14.7 % (ref 11.5–15.5)
WBC Count: 5.6 10*3/uL (ref 4.0–10.5)
nRBC: 0 % (ref 0.0–0.2)

## 2020-10-05 LAB — RETICULOCYTES
Immature Retic Fract: 11.5 % (ref 2.3–15.9)
RBC.: 3.72 MIL/uL — ABNORMAL LOW (ref 4.22–5.81)
Retic Count, Absolute: 55.4 10*3/uL (ref 19.0–186.0)
Retic Ct Pct: 1.5 % (ref 0.4–3.1)

## 2020-10-05 LAB — CMP (CANCER CENTER ONLY)
ALT: 40 U/L (ref 0–44)
AST: 39 U/L (ref 15–41)
Albumin: 4.1 g/dL (ref 3.5–5.0)
Alkaline Phosphatase: 68 U/L (ref 38–126)
Anion gap: 7 (ref 5–15)
BUN: 34 mg/dL — ABNORMAL HIGH (ref 8–23)
CO2: 27 mmol/L (ref 22–32)
Calcium: 9.5 mg/dL (ref 8.9–10.3)
Chloride: 106 mmol/L (ref 98–111)
Creatinine: 1.81 mg/dL — ABNORMAL HIGH (ref 0.61–1.24)
GFR, Estimated: 35 mL/min — ABNORMAL LOW (ref >60)
Glucose, Bld: 127 mg/dL — ABNORMAL HIGH (ref 70–99)
Potassium: 5.1 mmol/L (ref 3.5–5.1)
Sodium: 140 mmol/L (ref 135–145)
Total Bilirubin: 0.7 mg/dL (ref 0.3–1.2)
Total Protein: 6.5 g/dL (ref 6.5–8.1)

## 2020-10-05 NOTE — Progress Notes (Signed)
Hematology and Oncology Follow Up Visit  [name] [id number] 05-14-1928 87 y.o. 10/05/2020   Principle Diagnosis:  Erythropoietin deficiency anemia Iron deficiency anemia  Current Therapy:   IV iron as indicated -- feraheme given on 07/27/2019 Aranesp 300 g sq prn hemoglobin < 11 - last dose given on  08/10/2020   Interim History:  [name] is here today for follow-up.  Overall, he is doing fairly well.  Unfortunately, his wife doing all that well.  We also take care of her.  She has a lot of back issues.  I do see both of them in church on Sundays.  He is doing quite well.  We last saw him 2 months ago, his ferritin was 450 with an iron saturation of 27%.   Thankfully, his bladder cancer has not been an issue.  He has superficial bladder cancer that was treated.  I think his last treatment was back in March 2022.  He has had no hematuria.  He has had no problems with worsening kidney failure.  He has had no rashes.  He has had a little bit of leg swelling.  No problems with coronavirus.  Overall, his performance status is probably ECOG 2.      Medications:  Allergies as of 10/05/2020       Reactions   Penicillins Hives   Has patient had a PCN reaction causing immediate rash, facial/tongue/throat swelling, SOB or lightheadedness with hypotension: No Has patient had a PCN reaction causing severe rash involving mucus membranes or skin necrosis: Yes Has patient had a PCN reaction that required hospitalization: No Has patient had a PCN reaction occurring within the last 10 years: No If all of the above answers are "NO", then may proceed with Cephalosporin use.   Vancomycin Other (See Comments)   Edema and myalgia        Medication List        Accurate as of October 05, 2020  1:10 PM. If you have any questions, ask your nurse or doctor.          Accu-Chek FastClix Lancets Misc Check fasting blood sugar once daily   Accu-Chek Guide test strip Generic  drug: glucose blood Check fasting blood sugar once daily   allopurinol 300 MG tablet Commonly known as: ZYLOPRIM Take 0.5 tablets (150 mg total) by mouth daily.   amLODipine 5 MG tablet Commonly known as: NORVASC Take 1 tablet (5 mg total) by mouth daily.   atorvastatin 20 MG tablet Commonly known as: LIPITOR TAKE 1 TABLET AT BEDTIME   blood glucose meter kit and supplies Dispense based on patient and insurance preference. Use up to four times daily as directed. R73.03   carvedilol 6.25 MG tablet Commonly known as: COREG Take 1 tablet (6.25 mg total) by mouth 2 (two) times daily with a meal.   Cholecalciferol 125 MCG (5000 UT) capsule Take 5,000 Units by mouth daily.   CHROMIUM GTF PO Take 1 capsule by mouth every evening.   Cinnamon 500 MG Tabs Take 2 tablets by mouth at bedtime.   dicyclomine 10 MG capsule Commonly known as: BENTYL TAKE 1 CAPSULE (10 MG TOTAL) BY MOUTH 4 (FOUR) TIMES DAILY - BEFORE MEALS AND AT BEDTIME.   diphenoxylate-atropine 2.5-0.025 MG tablet Commonly known as: LOMOTIL 1 tablet. Pt takes PRN   Eliquis 2.5 MG Tabs tablet Generic drug: apixaban TAKE 1 TABLET TWICE A DAY   finasteride 5 MG tablet Commonly known as: PROSCAR Take 5 mg by mouth  daily.   fish oil-omega-3 fatty acids 1000 MG capsule Take 1 g by mouth daily.   furosemide 20 MG tablet Commonly known as: LASIX Take 20 mg by mouth as needed.   latanoprost 0.005 % ophthalmic solution Commonly known as: XALATAN Place 1 drop into both eyes at bedtime.   potassium chloride 10 MEQ tablet Commonly known as: KLOR-CON Take 1 tablet (10 mEq total) by mouth daily.   PRESERVISION AREDS PO Take 1 tablet by mouth 2 (two) times daily.   PROBIOTIC PO Take 1 tablet by mouth daily.   tamsulosin 0.4 MG Caps capsule Commonly known as: FLOMAX Take 0.4 mg by mouth.   timolol 0.5 % ophthalmic solution Commonly known as: TIMOPTIC Place 1 drop into both eyes 2 (two) times daily.    UNABLE TO FIND Med Name: Mito Q   VANADYL SULFATE PO Take 1 capsule by mouth every evening.   zinc gluconate 50 MG tablet Take 50 mg by mouth daily.        Allergies:  Allergies  Allergen Reactions   Penicillins Hives    Has patient had a PCN reaction causing immediate rash, facial/tongue/throat swelling, SOB or lightheadedness with hypotension: No Has patient had a PCN reaction causing severe rash involving mucus membranes or skin necrosis: Yes Has patient had a PCN reaction that required hospitalization: No Has patient had a PCN reaction occurring within the last 10 years: No If all of the above answers are "NO", then may proceed with Cephalosporin use.    Vancomycin Other (See Comments)    Edema and myalgia    Past Medical History, Surgical history, Social history, and Family History were reviewed and updated.  Review of Systems: Review of Systems  Constitutional: Negative.   HENT: Negative.    Eyes: Negative.   Respiratory: Negative.    Cardiovascular:  Positive for palpitations.  Gastrointestinal: Negative.   Genitourinary: Negative.   Musculoskeletal: Negative.   Skin: Negative.   Neurological: Negative.   Endo/Heme/Allergies: Negative.   Psychiatric/Behavioral: Negative.      Physical Exam:  weight is 155 lb (70.3 kg). His oral temperature is 98.2 F (36.8 C). His blood pressure is 152/56 (abnormal) and his pulse is 73. His respiration is 18 and oxygen saturation is 100%.   Wt Readings from Last 3 Encounters:  10/05/20 155 lb (70.3 kg)  08/10/20 159 lb (72.1 kg)  06/20/20 158 lb (71.7 kg)    Physical Exam Vitals reviewed.  HENT:     Head: Normocephalic and atraumatic.  Eyes:     Pupils: Pupils are equal, round, and reactive to light.  Cardiovascular:     Rate and Rhythm: Normal rate and regular rhythm.     Heart sounds: Normal heart sounds.  Pulmonary:     Effort: Pulmonary effort is normal.     Breath sounds: Normal breath sounds.   Abdominal:     General: Bowel sounds are normal.     Palpations: Abdomen is soft.  Musculoskeletal:        General: No tenderness or deformity. Normal range of motion.     Cervical back: Normal range of motion.  Lymphadenopathy:     Cervical: No cervical adenopathy.  Skin:    General: Skin is warm and dry.     Findings: No erythema or rash.  Neurological:     Mental Status: He is alert and oriented to person, place, and time.  Psychiatric:        Behavior: Behavior normal.  Thought Content: Thought content normal.        Judgment: Judgment normal.     Lab Results  Component Value Date   WBC 5.6 10/05/2020   HGB 11.3 (L) 10/05/2020   HCT 34.0 (L) 10/05/2020   MCV 90.9 10/05/2020   PLT 120 (L) 10/05/2020   Lab Results  Component Value Date   FERRITIN 450 (H) 08/10/2020   IRON 74 08/10/2020   TIBC 274 08/10/2020   UIBC 199 08/10/2020   IRONPCTSAT 27 08/10/2020   Lab Results  Component Value Date   RETICCTPCT 1.5 10/05/2020   RBC 3.72 (L) 10/05/2020   RBC 3.74 (L) 10/05/2020   No results found for: KPAFRELGTCHN, LAMBDASER, KAPLAMBRATIO No results found for: Kandis Cocking, IGMSERUM Lab Results  Component Value Date   TOTALPROTELP 6.0 (L) 12/26/2015   ALBUMINELP 3.4 (L) 12/26/2015   A1GS 0.4 (H) 12/26/2015   A2GS 0.8 12/26/2015   BETS 0.4 12/26/2015   BETA2SER 0.3 12/26/2015   GAMS 0.8 12/26/2015   MSPIKE Not Observed 01/29/2016   SPEI SEE NOTE 12/26/2015     Chemistry      Component Value Date/Time   NA 140 10/05/2020 1124   NA 141 08/11/2017 0924   NA 145 04/09/2017 0905   NA 140 03/28/2016 0853   K 5.1 10/05/2020 1124   K 4.5 04/09/2017 0905   K 4.4 03/28/2016 0853   CL 106 10/05/2020 1124   CL 105 04/09/2017 0905   CO2 27 10/05/2020 1124   CO2 27 04/09/2017 0905   CO2 19 (L) 03/28/2016 0853   BUN 34 (H) 10/05/2020 1124   BUN 27 08/11/2017 0924   BUN 33 (H) 04/09/2017 0905   BUN 34.4 (H) 03/28/2016 0853   CREATININE 1.81 (H) 10/05/2020  1124   CREATININE 1.72 (H) 08/18/2017 1543   CREATININE 1.7 (H) 03/28/2016 0853   GLU 113 07/24/2016 0000      Component Value Date/Time   CALCIUM 9.5 10/05/2020 1124   CALCIUM 9.2 04/09/2017 0905   CALCIUM 9.2 03/28/2016 0853   ALKPHOS 68 10/05/2020 1124   ALKPHOS 69 04/09/2017 0905   ALKPHOS 85 03/28/2016 0853   AST 39 10/05/2020 1124   AST 23 03/28/2016 0853   ALT 40 10/05/2020 1124   ALT 27 04/09/2017 0905   ALT 37 03/28/2016 0853   BILITOT 0.7 10/05/2020 1124   BILITOT 0.79 03/28/2016 0853      Impression and Plan: [name] is a very pleasant 87 yo caucasian gentleman with both erythropoietin deficiency and iron deficiency anemia.    Thankfully, he does not need Aranesp today.  Think that his exercising is helping him.  He is part of the [employer] program.  He does this 3 times a week.  We will plan to get him back in 2 more months.  I am sure he will need Aranesp at that time.    [name], MD 6/10/20221:10 PM

## 2020-10-05 NOTE — Telephone Encounter (Signed)
Appts made and pritned for pt per 10/05/20 los   [name]

## 2020-10-08 LAB — IRON AND TIBC
Iron: 76 ug/dL (ref 42–163)
Saturation Ratios: 28 % (ref 20–55)
TIBC: 273 ug/dL (ref 202–409)
UIBC: 197 ug/dL (ref 117–376)

## 2020-10-08 LAB — FERRITIN: Ferritin: 433 ng/mL — ABNORMAL HIGH (ref 24–336)

## 2020-10-26 ENCOUNTER — Other Ambulatory Visit: Payer: Self-pay

## 2020-10-26 DIAGNOSIS — K580 Irritable bowel syndrome with diarrhea: Secondary | ICD-10-CM

## 2020-10-30 ENCOUNTER — Ambulatory Visit (INDEPENDENT_AMBULATORY_CARE_PROVIDER_SITE_OTHER): Payer: Medicare Other

## 2020-10-30 DIAGNOSIS — I441 Atrioventricular block, second degree: Secondary | ICD-10-CM | POA: Diagnosis not present

## 2020-10-30 LAB — CUP PACEART REMOTE DEVICE CHECK
Battery Remaining Longevity: 79 mo
Battery Remaining Percentage: 67 %
Battery Voltage: 2.99 V
Brady Statistic RV Percent Paced: 99 %
Date Time Interrogation Session: 20220705031007
Implantable Lead Implant Date: 20190402
Implantable Lead Implant Date: 20190402
Implantable Lead Location: 753859
Implantable Lead Location: 753860
Implantable Pulse Generator Implant Date: 20190402
Lead Channel Impedance Value: 480 Ohm
Lead Channel Pacing Threshold Amplitude: 0.75 V
Lead Channel Pacing Threshold Pulse Width: 0.5 ms
Lead Channel Sensing Intrinsic Amplitude: 12 mV
Lead Channel Setting Pacing Amplitude: 2.5 V
Lead Channel Setting Pacing Pulse Width: 0.5 ms
Lead Channel Setting Sensing Sensitivity: 2 mV
Pulse Gen Model: 2272
Pulse Gen Serial Number: 9003454

## 2020-11-12 ENCOUNTER — Emergency Department (HOSPITAL_BASED_OUTPATIENT_CLINIC_OR_DEPARTMENT_OTHER)
Admission: EM | Admit: 2020-11-12 | Discharge: 2020-11-12 | Disposition: A | Discharge status: Home / Self Care | Payer: Medicare Other | Attending: Emergency Medicine | Admitting: Emergency Medicine

## 2020-11-12 ENCOUNTER — Encounter (HOSPITAL_BASED_OUTPATIENT_CLINIC_OR_DEPARTMENT_OTHER): Payer: Self-pay

## 2020-11-12 ENCOUNTER — Emergency Department (HOSPITAL_BASED_OUTPATIENT_CLINIC_OR_DEPARTMENT_OTHER): Payer: Medicare Other

## 2020-11-12 ENCOUNTER — Other Ambulatory Visit: Payer: Self-pay

## 2020-11-12 DIAGNOSIS — I5033 Acute on chronic diastolic (congestive) heart failure: Secondary | ICD-10-CM | POA: Insufficient documentation

## 2020-11-12 DIAGNOSIS — Z87891 Personal history of nicotine dependence: Secondary | ICD-10-CM | POA: Diagnosis not present

## 2020-11-12 DIAGNOSIS — E1169 Type 2 diabetes mellitus with other specified complication: Secondary | ICD-10-CM | POA: Diagnosis not present

## 2020-11-12 DIAGNOSIS — I110 Hypertensive heart disease with heart failure: Secondary | ICD-10-CM | POA: Diagnosis not present

## 2020-11-12 DIAGNOSIS — Z951 Presence of aortocoronary bypass graft: Secondary | ICD-10-CM | POA: Insufficient documentation

## 2020-11-12 DIAGNOSIS — E1151 Type 2 diabetes mellitus with diabetic peripheral angiopathy without gangrene: Secondary | ICD-10-CM | POA: Insufficient documentation

## 2020-11-12 DIAGNOSIS — I25709 Atherosclerosis of coronary artery bypass graft(s), unspecified, with unspecified angina pectoris: Secondary | ICD-10-CM | POA: Diagnosis not present

## 2020-11-12 DIAGNOSIS — E1136 Type 2 diabetes mellitus with diabetic cataract: Secondary | ICD-10-CM | POA: Diagnosis not present

## 2020-11-12 DIAGNOSIS — Z7901 Long term (current) use of anticoagulants: Secondary | ICD-10-CM | POA: Insufficient documentation

## 2020-11-12 DIAGNOSIS — Z8551 Personal history of malignant neoplasm of bladder: Secondary | ICD-10-CM | POA: Insufficient documentation

## 2020-11-12 DIAGNOSIS — E1122 Type 2 diabetes mellitus with diabetic chronic kidney disease: Secondary | ICD-10-CM | POA: Diagnosis not present

## 2020-11-12 DIAGNOSIS — I517 Cardiomegaly: Secondary | ICD-10-CM | POA: Diagnosis not present

## 2020-11-12 DIAGNOSIS — J90 Pleural effusion, not elsewhere classified: Secondary | ICD-10-CM | POA: Diagnosis not present

## 2020-11-12 DIAGNOSIS — N183 Chronic kidney disease, stage 3 unspecified: Secondary | ICD-10-CM | POA: Diagnosis not present

## 2020-11-12 DIAGNOSIS — R0602 Shortness of breath: Secondary | ICD-10-CM | POA: Diagnosis not present

## 2020-11-12 DIAGNOSIS — J811 Chronic pulmonary edema: Secondary | ICD-10-CM | POA: Diagnosis not present

## 2020-11-12 DIAGNOSIS — I130 Hypertensive heart and chronic kidney disease with heart failure and stage 1 through stage 4 chronic kidney disease, or unspecified chronic kidney disease: Secondary | ICD-10-CM | POA: Insufficient documentation

## 2020-11-12 DIAGNOSIS — E785 Hyperlipidemia, unspecified: Secondary | ICD-10-CM | POA: Diagnosis not present

## 2020-11-12 DIAGNOSIS — Z79899 Other long term (current) drug therapy: Secondary | ICD-10-CM | POA: Insufficient documentation

## 2020-11-12 LAB — CBC WITH DIFFERENTIAL/PLATELET
Abs Immature Granulocytes: 0.05 10*3/uL (ref 0.00–0.07)
Basophils Absolute: 0 10*3/uL (ref 0.0–0.1)
Basophils Relative: 0 %
Eosinophils Absolute: 0.1 10*3/uL (ref 0.0–0.5)
Eosinophils Relative: 1 %
HCT: 29.9 % — ABNORMAL LOW (ref 39.0–52.0)
Hemoglobin: 9.8 g/dL — ABNORMAL LOW (ref 13.0–17.0)
Immature Granulocytes: 1 %
Lymphocytes Relative: 17 %
Lymphs Abs: 1.3 10*3/uL (ref 0.7–4.0)
MCH: 30.1 pg (ref 26.0–34.0)
MCHC: 32.8 g/dL (ref 30.0–36.0)
MCV: 91.7 fL (ref 80.0–100.0)
Monocytes Absolute: 0.9 10*3/uL (ref 0.1–1.0)
Monocytes Relative: 12 %
Neutro Abs: 5.3 10*3/uL (ref 1.7–7.7)
Neutrophils Relative %: 69 %
Platelets: 185 10*3/uL (ref 150–400)
RBC: 3.26 MIL/uL — ABNORMAL LOW (ref 4.22–5.81)
RDW: 15.6 % — ABNORMAL HIGH (ref 11.5–15.5)
WBC: 7.6 10*3/uL (ref 4.0–10.5)
nRBC: 0 % (ref 0.0–0.2)

## 2020-11-12 LAB — COMPREHENSIVE METABOLIC PANEL
ALT: 26 U/L (ref 0–44)
AST: 24 U/L (ref 15–41)
Albumin: 3.4 g/dL — ABNORMAL LOW (ref 3.5–5.0)
Alkaline Phosphatase: 93 U/L (ref 38–126)
Anion gap: 7 (ref 5–15)
BUN: 35 mg/dL — ABNORMAL HIGH (ref 8–23)
CO2: 20 mmol/L — ABNORMAL LOW (ref 22–32)
Calcium: 8.6 mg/dL — ABNORMAL LOW (ref 8.9–10.3)
Chloride: 111 mmol/L (ref 98–111)
Creatinine, Ser: 1.93 mg/dL — ABNORMAL HIGH (ref 0.61–1.24)
GFR, Estimated: 32 mL/min — ABNORMAL LOW (ref >60)
Glucose, Bld: 176 mg/dL — ABNORMAL HIGH (ref 70–99)
Potassium: 4.5 mmol/L (ref 3.5–5.1)
Sodium: 138 mmol/L (ref 135–145)
Total Bilirubin: 0.5 mg/dL (ref 0.3–1.2)
Total Protein: 7 g/dL (ref 6.5–8.1)

## 2020-11-12 LAB — TROPONIN I (HIGH SENSITIVITY): Troponin I (High Sensitivity): 33 ng/L — ABNORMAL HIGH (ref <18)

## 2020-11-12 LAB — BRAIN NATRIURETIC PEPTIDE: B Natriuretic Peptide: 1533.9 pg/mL — ABNORMAL HIGH (ref 0.0–100.0)

## 2020-11-12 MED ORDER — FUROSEMIDE 10 MG/ML IJ SOLN
60.0000 mg | Freq: Once | INTRAMUSCULAR | Status: AC
  Administered 2020-11-12: 60 mg via INTRAVENOUS
  Filled 2020-11-12: qty 6

## 2020-11-12 NOTE — Discharge Instructions (Addendum)
If you have any difficulty involving, any episodes of passing out or chest pain, worsening weight gain or leg swelling, please return to the emergency room for reassessment.  Please call your cardiologist this afternoon to request a close follow-up appointment.  If they are not able to see you within the next few days, call your primary doctor and request an appointment with.  For the next 3 to 4 days, I would recommend increasing your dose of Lasix to 40 mg once daily and then go back to your regular dose unless otherwise instructed by your cardiologist.

## 2020-11-12 NOTE — ED Triage Notes (Addendum)
Pt c/o shortness of breath x 2 days. States worse when lying flat and bending over. States has noticed swelling in BLE. States gained 5 pounds since yesterday.

## 2020-11-12 NOTE — ED Provider Notes (Signed)
[name] EM[facility]GENCY DEPARTMENT Provider Note   CSN: [id number] Arrival date & time: 11/12/20  1054     History Chief Complaint  Patient presents with   Shortness of Breath    [name] is a 87 y.o. male.  Presents to ER with concern for shortness of breath and leg swelling.  Patient states that breathing is worse when lying flat or bending over.  Improved with sitting up.  Legs have been progressively more swollen over the last few days and has gained a few pounds as well.  No recent changes in his diuretic.  Takes 20 mg Lasix daily.  No chest pain.  Today is his anniversary.  Wife at bedside.  Extensive past medical history including A. fib, bladder cancer, CAD, diabetes.  Diastolic dysfunction noted on last echocardiogram.  HPI     Past Medical History:  Diagnosis Date   A-fib ([place])    Basal cell carcinoma    skin   Bigeminal rhythm    Bradycardia 06/21/2017   Cancer of anterior wall of urinary bladder ([place]) 07/18/2019   Cataract    Chronic renal insufficiency, stage 3 (moderate) ([place]) 2018   GFR 30s-40s   Coronary artery disease    post bypass   CVD (cardiovascular disease)    Diabetes mellitus without complication ([place])    type 2 diet controlled   Diverticular disease    Easy bruising    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   Gout    Hematuria 06/30/2019   Hernia    Hyperkalemia 05/06/2017   Hyperlipidemia    Hypertension    IBS (irritable bowel syndrome)    Iron deficiency anemia 02/04/2016   Macular degeneration    Microscopic colitis    PVD (peripheral vascular disease) ([place])     Patient Active Problem List   Diagnosis Date Noted   Trigger middle finger of right hand 06/13/2020   Chronic right shoulder pain 06/13/2020   Acute pain of left shoulder 06/13/2020   Chronic anticoagulation 11/26/2019   Cancer of anterior wall of urinary bladder ([place]) 07/18/2019   Irritable bowel syndrome with diarrhea 11/04/2017   [place] (type) I  [facility]) atrioventricular block 06/21/2017   Coronary artery disease of bypass graft of native heart with stable angina pectoris ([place])    Other hyperlipidemia    Chronic diastolic CHF (congestive heart failure), NYHA class 1 ([place]) 03/24/2017   Iron deficiency anemia 02/04/2016   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   Vitamin D deficiency 12/27/2015   Anemia 12/27/2015   Bence Jones proteinuria 12/27/2015   Chronic kidney disease (CKD), stage III (moderate) ([place]) 12/19/2015   Macular degeneration    Gout    A-fib ([place]) 01/03/2013   Essential hypertension 08/02/2008   BIGEMINY 08/02/2008   Prediabetes 08/02/2008    Past Surgical History:  Procedure Laterality Date   CARDIAC CATHETERIZATION  2006   CARDIOVERSION N/A 02/22/2013   Procedure: CARDIOVERSION;  Surgeon: [name], MD;  Location: [place];  Service: Cardiovascular;  Laterality: N/A;   CARDIOVERSION N/A 11/15/2014   Procedure: CARDIOVERSION;  Surgeon: [name], MD;  Location: [place];  Service: Cardiovascular;  Laterality: N/A;   CARDIOVERSION N/A 04/01/2017   Procedure: CARDIOVERSION;  Surgeon: Larey Dresser, MD;  Location: [place];  Service: Cardiovascular;  Laterality: N/A;   CAROTID ENDARTERECTOMY  2009/ 1993   left/ right   COLONOSCOPY  03/17/2005   The colon is normal.   [place]  CYSTOSCOPY W/ RETROGRADES Bilateral 07/14/2019   Procedure: CYSTOSCOPY WITH RETROGRADE PYELOGRAM;  Surgeon: Franchot Gallo, MD;  [facility];  Service: Urology;  Laterality: Bilateral;   ESOPHAGOGASTRODUODENOSCOPY  03/17/2005   Normal esophagus. Normal Stomanch. Normal duodenum.    EYE SURGERY     eyelid repair   INGUINAL HERNIA REPAIR  02/11/2012   Procedure: LAPAROSCOPIC BILATERAL INGUINAL HERNIA REPAIR;  Surgeon: Pedro Earls, MD;  Location: [facility];  Service: General;  Laterality: Bilateral;   PACEMAKER IMPLANT N/A 07/28/2017   St Jude  Medical Assurity MRI conditional  dual-chamber pacemaker for symptomatic second degree AV block by Dr Rayann Heman   SKIN CANCER EXCISION     right ear x 3   TRANSURETHRAL RESECTION OF BLADDER TUMOR N/A 09/05/2019   Procedure: TRANSURETHRAL RESECTION OF BLADDER TUMOR (TURBT);  Surgeon: Franchot Gallo, MD;  Location: [facility];  Service: Urology;  Laterality: N/A;   TRANSURETHRAL RESECTION OF BLADDER TUMOR WITH MITOMYCIN-C N/A 07/14/2019   Procedure: TRANSURETHRAL RESECTION OF BLADDER TUMOR WITH GEMCITABINE IN PACU;  Surgeon: Franchot Gallo, MD;  Location: [facility];  Service: Urology;  Laterality: N/A;       Family History  Problem Relation Age of Onset   Stroke Mother    Coronary artery disease Father    Heart disease Father    Melanoma Sister    Colon cancer Neg Hx     Social History   Tobacco Use   Smoking status: Former   Smokeless tobacco: Former    Types: Chew    Quit date: 02/03/1969  Vaping Use   Vaping Use: Never used  Substance Use Topics   Alcohol use: No    Alcohol/week: 0.0 standard drinks   Drug use: No    Home Medications Prior to Admission medications   Medication Sig Start Date End Date Taking? Authorizing Provider  ACCU-CHEK FASTCLIX LANCETS MISC Check fasting blood sugar once daily 05/06/18   Kuneff, Renee A, DO  allopurinol (ZYLOPRIM) 300 MG tablet Take 0.5 tablets (150 mg total) by mouth daily. 05/02/20   Kuneff, Renee A, DO  amLODipine (NORVASC) 5 MG tablet Take 1 tablet (5 mg total) by mouth daily. 05/02/20   Kuneff, Renee A, DO  atorvastatin (LIPITOR) 20 MG tablet TAKE 1 TABLET AT BEDTIME 11/29/19   Kuneff, Renee A, DO  blood glucose meter kit and supplies Dispense based on patient and insurance preference. Use up to four times daily as directed. R73.03 06/28/19   Kuneff, Renee A, DO  carvedilol (COREG) 6.25 MG tablet Take 1 tablet (6.25 mg total) by mouth 2 (two) times daily with a meal. 05/02/20   Kuneff, Renee A, DO   Cholecalciferol 125 MCG (5000 UT) capsule Take 5,000 Units by mouth daily.    [provider]  CHROMIUM GTF PO Take 1 capsule by mouth every evening.     [provider]  Cinnamon 500 MG TABS Take 2 tablets by mouth at bedtime.     [provider]  dicyclomine (BENTYL) 10 MG capsule TAKE 1 CAPSULE (10 MG TOTAL) BY MOUTH 4 (FOUR) TIMES DAILY - BEFORE MEALS AND AT BEDTIME. 05/02/20   Kuneff, Renee A, DO  diphenoxylate-atropine (LOMOTIL) 2.5-0.025 MG tablet 1 tablet. Pt takes PRN 08/23/18   [provider]  ELIQUIS 2.5 MG TABS tablet TAKE 1 TABLET TWICE A DAY 08/27/20   [name], MD  finasteride (PROSCAR) 5 MG tablet Take 5 mg by mouth daily. 08/24/17  [provider]  fish oil-omega-3 fatty acids 1000 MG capsule Take 1 g by mouth daily.     [provider]  furosemide (LASIX) 20 MG tablet Take 20 mg by mouth as needed.    [provider]  glucose blood (ACCU-CHEK GUIDE) test strip Check fasting blood sugar once daily 08/14/20   Kuneff, Renee A, DO  latanoprost (XALATAN) 0.005 % ophthalmic solution Place 1 drop into both eyes at bedtime. 07/14/18   [provider]  Multiple Vitamins-Minerals (PRESERVISION AREDS PO) Take 1 tablet by mouth 2 (two) times daily.     [provider]  potassium chloride (KLOR-CON) 10 MEQ tablet Take 1 tablet (10 mEq total) by mouth daily. 05/02/20   Kuneff, Renee A, DO  Probiotic Product (PROBIOTIC PO) Take 1 tablet by mouth daily.    [provider]  tamsulosin (FLOMAX) 0.4 MG CAPS capsule Take 0.4 mg by mouth.    [provider]  timolol (TIMOPTIC) 0.5 % ophthalmic solution Place 1 drop into both eyes 2 (two) times daily.  10/06/18   [provider]  UNABLE TO FIND Med Name: Ester Rink    [provider]  VANADYL SULFATE PO Take 1 capsule by mouth every evening.     [provider]  zinc gluconate 50 MG tablet Take 50 mg by mouth daily.     [provider]    Allergies    Penicillins and Vancomycin  Review of Systems   Review of Systems  Constitutional:  Negative for chills and fever.  HENT:  Negative for ear pain and sore throat.   Eyes:  Negative for pain and visual disturbance.  Respiratory:  Positive for shortness of breath. Negative for cough.   Cardiovascular:  Positive for leg swelling. Negative for chest pain and palpitations.  Gastrointestinal:  Negative for abdominal pain and vomiting.  Genitourinary:  Negative for dysuria and hematuria.  Musculoskeletal:  Negative for arthralgias and back pain.  Skin:  Negative for color change and rash.  Neurological:  Negative for seizures and syncope.  All other systems reviewed and are negative.  Physical Exam Updated Vital Signs BP (!) 177/74 Comment: after ambulating  Pulse 67   Temp 98 F (36.7 C) (Oral)   Resp (!) 21   Ht '5\' 8"'  (1.727 m)   Wt 73 kg   SpO2 94%   BMI 24.48 kg/m   Physical Exam Vitals and nursing note reviewed.  Constitutional:      Appearance: He is well-developed.  HENT:     Head: Normocephalic and atraumatic.  Eyes:     Conjunctiva/sclera: Conjunctivae normal.  Cardiovascular:     Rate and Rhythm: Normal rate and regular rhythm.     Heart sounds: No murmur heard. Pulmonary:     Effort: Pulmonary effort is normal. No respiratory distress.     Breath sounds: Normal breath sounds.  Abdominal:     Palpations: Abdomen is soft.     Tenderness: There is no abdominal tenderness.  Musculoskeletal:     Cervical back: Neck supple.     Comments: Bilateral pitting edema to both lower legs  Skin:    General: Skin is warm and dry.  Neurological:     General: No focal deficit present.     Mental Status: He is alert.  Psychiatric:        Mood and Affect: Mood normal.        Behavior: Behavior normal.    ED Results / Procedures /  Treatments   Labs (all labs ordered are listed, but only abnormal results are displayed) Labs Reviewed  CBC  WITH DIFFERENTIAL/PLATELET - Abnormal; Notable for the following components:      Result Value   RBC 3.26 (*)    Hemoglobin 9.8 (*)    HCT 29.9 (*)    RDW 15.6 (*)    All other components within normal limits  COMPREHENSIVE METABOLIC PANEL - Abnormal; Notable for the following components:   CO2 20 (*)    Glucose, Bld 176 (*)    BUN 35 (*)    Creatinine, Ser 1.93 (*)    Calcium 8.6 (*)    Albumin 3.4 (*)    GFR, Estimated 32 (*)    All other components within normal limits  BRAIN NATRIURETIC PEPTIDE - Abnormal; Notable for the following components:   B Natriuretic Peptide 1,533.9 (*)    All other components within normal limits  TROPONIN I (HIGH SENSITIVITY) - Abnormal; Notable for the following components:   Troponin I (High Sensitivity) 33 (*)    All other components within normal limits  TROPONIN I (HIGH SENSITIVITY)    EKG EKG Interpretation  Date/Time:  Monday November 12 2020 11:22:19 EDT Ventricular Rate:  60 PR Interval:    QRS Duration: 163 QT Interval:  487 QTC Calculation: 487 R Axis:   255 Text Interpretation: Junctional rhythm Nonspecific IVCD with LAD Inferior infarct, old Abnormal lateral Q waves Anterior infarct, old Confirmed by Madalyn Rob [phone]) on 11/13/2020 10:25:04 AM  Radiology DG Chest 2 View  Result Date: 11/12/2020 CLINICAL DATA:  Shortness of breath. EXAM: CHEST - 2 VIEW COMPARISON:  One-view chest x-ray 07/29/2017 FINDINGS: Heart is enlarged. Atherosclerotic changes present at the aortic arch. Patient is status post median sternotomy for CABG. Bilateral pleural effusions are present. Moderate pulmonary vascular congestion is noted. Bibasilar airspace opacities likely reflect atelectasis. IMPRESSION: Cardiomegaly with moderate pulmonary vascular congestion and bilateral pleural effusions compatible with congestive heart failure. Electronically Signed   By: San Morelle M.D.   On: 11/12/2020 12:16    Procedures Procedures   Medications  Ordered in ED Medications  furosemide (LASIX) injection 60 mg (60 mg Intravenous Given 11/12/20 1308)    ED Course  I have reviewed the triage vital signs and the nursing notes.  Pertinent labs & imaging results that were available during my care of the patient were reviewed by me and considered in my medical decision making (see chart for details).    MDM Rules/Calculators/A&P                          87 year old male presents to ER with concern for some weight gain, leg swelling as well as some shortness of breath.  On exam he appears well, no respiratory distress at rest, legs were significantly swollen.  Basic labs concerning for significant elevation in BNP.  EKG without acute ischemic change, no chest pain, troponin minimally elevated, doubt ACS.  Chest x-ray with findings compatible with congestive heart failure.  Provided dose of IV Lasix.  Patient did well on ambulation trial.  I had lengthy discussion with patient regarding my concern for a heart failure exacerbation and given his elevation in BNP, troponin, recommended that he be admitted for more aggressive diuresis.  Patient acknowledged my concerns but remain adamant that he would like to pursue outpatient management at present.  We will honor patient's request and discharge home.  And lieu of admission, recommended to  increase home Lasix dose for the next few days and contact his cardiology clinic and primary care office to request very close outpatient appointment.  Reviewed strict return precautions and discharged.   Final Clinical Impression(s) / ED Diagnoses Final diagnoses:  Acute on chronic diastolic heart failure [facility])    Rx / DC Orders ED Discharge Orders     None        Lucrezia Starch, MD 11/13/20 1201

## 2020-11-12 NOTE — ED Notes (Signed)
Ambulated on r/a, SpO2 93-95%, HR 70 max, RR 20s, no increases WOB noted.

## 2020-11-12 NOTE — ED Notes (Signed)
Bilateral ankle swelling is easily noted.

## 2020-11-12 NOTE — ED Notes (Signed)
States has been experiencing an increase in SOB lately, stated also that he has gained about 5-8 lbs in the past few days.

## 2020-11-13 DIAGNOSIS — H35433 Paving stone degeneration of retina, bilateral: Secondary | ICD-10-CM | POA: Diagnosis not present

## 2020-11-13 DIAGNOSIS — H35423 Microcystoid degeneration of retina, bilateral: Secondary | ICD-10-CM | POA: Diagnosis not present

## 2020-11-13 DIAGNOSIS — H35373 Puckering of macula, bilateral: Secondary | ICD-10-CM | POA: Diagnosis not present

## 2020-11-13 DIAGNOSIS — H353134 Nonexudative age-related macular degeneration, bilateral, advanced atrophic with subfoveal involvement: Secondary | ICD-10-CM | POA: Diagnosis not present

## 2020-11-13 NOTE — Progress Notes (Signed)
CARDIOLOGY CONSULT NOTE       Patient ID: [name] MRN: [id number] DOB/AGE: 1928/09/01 87 y.o.   Primary Physician: [name], DO Primary Cardiologist: Johnsie Cancel Primary EP:  Allred   HPI:  87 y.o. followed mostly by Dr Rayann Heman. Post St Jude PPM for CHB in 2019 PPM is MRI safe. He is chronically anticoagulated low dose due to CRF for PAF. Failed multiple previous cardioversion's  Distant history of CABG in 1999 LIMA to LAD, SVG to IM, Sequenced SVG OM1OM2, SVG to D1 and SVG to PDA. Grafts patent by cath in 2011 and non ischemic myovue in 2017 with EF 70%  Echo in 2018 with moderate MR   Sees Ennever for anemia has had Aranesp and iron infusions Reviewed PACEART from 10/30/20 normal function   Seen in ED 11/12/20 more dyspnea PND and edema Normally takes lasix 20 mg daily BNP very  Elevated 1533 and CXR consistent with CHF Rx iv lasix Advised admission but patient wanted To go home Cr elevated 1.93 and K 4.5 , Hct 29.9   ECG:  afib with V pacing rate 60   Prior to admission was not taking lasix daily just as needed Since ER 40 mg daily   Son with him today and daughter nearby He has been married for 20 years     ROS All other systems reviewed and negative except as noted above  Past Medical History:  Diagnosis Date   A-fib ([place])    Basal cell carcinoma    skin   Bigeminal rhythm    Bradycardia 06/21/2017   Cancer of anterior wall of urinary bladder ([place]) 07/18/2019   Cataract    Chronic renal insufficiency, stage 3 (moderate) ([place]) 2018   GFR 30s-40s   Coronary artery disease    post bypass   CVD (cardiovascular disease)    Diabetes mellitus without complication (HCC)    type 2 diet controlled   Diverticular disease    Easy bruising    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   Gout    Hematuria 06/30/2019   Hernia    Hyperkalemia 05/06/2017   Hyperlipidemia    Hypertension    IBS (irritable bowel syndrome)    Iron deficiency anemia 02/04/2016   Macular  degeneration    Microscopic colitis    PVD (peripheral vascular disease) ([place])     Family History  Problem Relation Age of Onset   Stroke Mother    Coronary artery disease Father    Heart disease Father    Melanoma Sister    Colon cancer Neg Hx     Social History   Socioeconomic History   Marital status: Married    Spouse name: Not on file   Number of children: Not on file   Years of education: Not on file   Highest education level: Not on file  Occupational History   Occupation: retired  Tobacco Use   Smoking status: Former   Smokeless tobacco: Former    Types: Chew    Quit date: 02/03/1969  Vaping Use   Vaping Use: Never used  Substance and Sexual Activity   Alcohol use: No    Alcohol/week: 0.0 standard drinks   Drug use: No   Sexual activity: Never  Other Topics Concern   Not on file  Social History Narrative   Married to [name], 2 children [name] and [name].   Some college. Retired from [employer].   Drinks caffeine, uses herbal remedies, takes a daily vitamin.  Wears his seatbelt, smoke detector at home, firearms in the home.   Wears a hearing aid.   Feels safe in her relationships.   Social Determinants of Health   Financial Resource Strain: Not on file  Food Insecurity: Not on file  Transportation Needs: Not on file  Physical Activity: Not on file  Stress: Not on file  Social Connections: Not on file  Intimate Partner Violence: Not on file    Past Surgical History:  Procedure Laterality Date   CARDIAC CATHETERIZATION  2006   CARDIOVERSION N/A 02/22/2013   Procedure: CARDIOVERSION;  Surgeon: [name], MD;  Location: [facility];  Service: Cardiovascular;  Laterality: N/A;   CARDIOVERSION N/A 11/15/2014   Procedure: CARDIOVERSION;  Surgeon: [name], MD;  Location: [place];  Service: Cardiovascular;  Laterality: N/A;   CARDIOVERSION N/A 04/01/2017   Procedure: CARDIOVERSION;  Surgeon: Larey Dresser, MD;  Location: [place];  Service: Cardiovascular;  Laterality: N/A;   CAROTID ENDARTERECTOMY  2009/ 1993   left/ right   COLONOSCOPY  03/17/2005   The colon is normal.   CORONARY ARTERY BYPASS GRAFT  1999   CYSTOSCOPY W/ RETROGRADES Bilateral 07/14/2019   Procedure: CYSTOSCOPY WITH RETROGRADE PYELOGRAM;  Surgeon: Franchot Gallo, MD;  Location: [name];  Service: Urology;  Laterality: Bilateral;   ESOPHAGOGASTRODUODENOSCOPY  03/17/2005   Normal esophagus. Normal Stomanch. Normal duodenum.    EYE SURGERY     eyelid repair   INGUINAL HERNIA REPAIR  02/11/2012   Procedure: LAPAROSCOPIC BILATERAL INGUINAL HERNIA REPAIR;  Surgeon: Pedro Earls, MD;  Location: [facility];  Service: General;  Laterality: Bilateral;   PACEMAKER IMPLANT N/A 07/28/2017   St Jude Medical Assurity MRI conditional  dual-chamber pacemaker for symptomatic second degree AV block by Dr Rayann Heman   SKIN CANCER EXCISION     right ear x 3   TRANSURETHRAL RESECTION OF BLADDER TUMOR N/A 09/05/2019   Procedure: TRANSURETHRAL RESECTION OF BLADDER TUMOR (TURBT);  Surgeon: Franchot Gallo, MD;  Location: [facility];  Service: Urology;  Laterality: N/A;   TRANSURETHRAL RESECTION OF BLADDER TUMOR WITH MITOMYCIN-C N/A 07/14/2019   Procedure: TRANSURETHRAL RESECTION OF BLADDER TUMOR WITH GEMCITABINE IN PACU;  Surgeon: Franchot Gallo, MD;  Location: [facility];  Service: Urology;  Laterality: N/A;      Current Outpatient Medications:    ACCU-CHEK FASTCLIX LANCETS MISC, Check fasting blood sugar once daily, Disp: 100 each, Rfl: 3   allopurinol (ZYLOPRIM) 300 MG tablet, Take 0.5 tablets (150 mg total) by mouth daily., Disp: 45 tablet, Rfl: 3   amLODipine (NORVASC) 5 MG tablet, Take 1 tablet (5 mg total) by mouth daily., Disp: 90 tablet, Rfl: 1   atorvastatin (LIPITOR) 20 MG tablet, TAKE 1 TABLET AT BEDTIME, Disp: 90 tablet, Rfl: 3   blood glucose meter kit and supplies, Dispense based on patient  and insurance preference. Use up to four times daily as directed. R73.03, Disp: 1 each, Rfl: 0   carvedilol (COREG) 6.25 MG tablet, Take 1 tablet (6.25 mg total) by mouth 2 (two) times daily with a meal., Disp: 180 tablet, Rfl: 3   Cholecalciferol 125 MCG (5000 UT) capsule, Take 5,000 Units by mouth daily., Disp: , Rfl:    CHROMIUM GTF PO, Take 1 capsule by mouth every evening. , Disp: , Rfl:    Cinnamon 500 MG TABS, Take 2 tablets by mouth at bedtime. , Disp: , Rfl:    dicyclomine (BENTYL) 10 MG capsule, TAKE 1  CAPSULE (10 MG TOTAL) BY MOUTH 4 (FOUR) TIMES DAILY - BEFORE MEALS AND AT BEDTIME., Disp: 360 capsule, Rfl: 1   diphenoxylate-atropine (LOMOTIL) 2.5-0.025 MG tablet, 1 tablet. Pt takes PRN, Disp: , Rfl:    ELIQUIS 2.5 MG TABS tablet, TAKE 1 TABLET TWICE A DAY, Disp: 180 tablet, Rfl: 1   finasteride (PROSCAR) 5 MG tablet, Take 5 mg by mouth daily., Disp: , Rfl: 3   fish oil-omega-3 fatty acids 1000 MG capsule, Take 1 g by mouth daily. , Disp: , Rfl:    furosemide (LASIX) 20 MG tablet, Take 20 mg by mouth as needed., Disp: , Rfl:    glucose blood (ACCU-CHEK GUIDE) test strip, Check fasting blood sugar once daily, Disp: 100 each, Rfl: 3   latanoprost (XALATAN) 0.005 % ophthalmic solution, Place 1 drop into both eyes at bedtime., Disp: , Rfl:    Multiple Vitamins-Minerals (PRESERVISION AREDS PO), Take 1 tablet by mouth 2 (two) times daily. , Disp: , Rfl:    potassium chloride (KLOR-CON) 10 MEQ tablet, Take 1 tablet (10 mEq total) by mouth daily., Disp: 90 tablet, Rfl: 3   Probiotic Product (PROBIOTIC PO), Take 1 tablet by mouth daily., Disp: , Rfl:    tamsulosin (FLOMAX) 0.4 MG CAPS capsule, Take 0.4 mg by mouth., Disp: , Rfl:    timolol (TIMOPTIC) 0.5 % ophthalmic solution, Place 1 drop into both eyes 2 (two) times daily. , Disp: , Rfl:    UNABLE TO FIND, Med Name: Mito Q, Disp: , Rfl:    VANADYL SULFATE PO, Take 1 capsule by mouth every evening. , Disp: , Rfl:    zinc gluconate 50 MG  tablet, Take 50 mg by mouth daily. , Disp: , Rfl:  No current facility-administered medications for this visit.  Facility-Administered Medications Ordered in Other Visits:    gemcitabine (GEMZAR) chemo syringe for bladder instillation 2,000 mg, 2,000 mg, Bladder Instillation, Once, Franchot Gallo, MD    Physical Exam: There were no vitals taken for this visit.   Elderly male JVP markedly elevated with A/V waves  Lungs clear SEM  JVP mildly elevated Plus on LE edema PPM under left clavicle   Labs:   Lab Results  Component Value Date   WBC 7.6 11/12/2020   HGB 9.8 (L) 11/12/2020   HCT 29.9 (L) 11/12/2020   MCV 91.7 11/12/2020   PLT 185 11/12/2020    Recent Labs  Lab 11/12/20 1135  NA 138  K 4.5  CL 111  CO2 20*  BUN 35*  CREATININE 1.93*  CALCIUM 8.6*  PROT 7.0  BILITOT 0.5  ALKPHOS 93  ALT 26  AST 24  GLUCOSE 176*   Lab Results  Component Value Date   CKTOTAL 42 05/25/2018   CKMB 2.3 06/16/2011   TROPONINI <0.03 06/21/2017    Lab Results  Component Value Date   CHOL 125 11/24/2019   CHOL 110 06/21/2017   CHOL 112 06/09/2013   Lab Results  Component Value Date   HDL 57 11/24/2019   HDL 46 06/21/2017   HDL 43.20 06/09/2013   Lab Results  Component Value Date   LDLCALC 54 11/24/2019   LDLCALC 55 06/21/2017   LDLCALC 55 06/09/2013   Lab Results  Component Value Date   TRIG 67 11/24/2019   TRIG 44 06/21/2017   TRIG 69.0 06/09/2013   Lab Results  Component Value Date   CHOLHDL 2.2 11/24/2019   CHOLHDL 2.4 06/21/2017   CHOLHDL 3 06/09/2013   Lab Results  Component Value Date   LDLDIRECT 49 01/15/2017      Radiology: DG Chest 2 View  Result Date: 11/12/2020 CLINICAL DATA:  Shortness of breath. EXAM: CHEST - 2 VIEW COMPARISON:  One-view chest x-ray 07/29/2017 FINDINGS: Heart is enlarged. Atherosclerotic changes present at the aortic arch. Patient is status post median sternotomy for CABG. Bilateral pleural effusions are present.  Moderate pulmonary vascular congestion is noted. Bibasilar airspace opacities likely reflect atelectasis. IMPRESSION: Cardiomegaly with moderate pulmonary vascular congestion and bilateral pleural effusions compatible with congestive heart failure. Electronically Signed   By: San Morelle M.D.   On: 11/12/2020 12:16   CUP PACEART REMOTE DEVICE CHECK  Result Date: 10/30/2020 Scheduled remote reviewed. Normal device function.  Next remote 91 days- JBox, RN/CVRS   EKG: afib with v pacing rate 60    ASSESSMENT AND PLAN:   CAD/CABG:  Distant CABG no chest pain observe Not ideal to cath given his age and renal failure  AFib:  chronic rate control fine low dose anticoagulation with age and CRF CHF:  etiology not clear improved with higher dose lasix f/u TTE Concern that PPM wire causing some Right sided failure with significant JVP elevation today A/V waves Also MR may have worsened  atrial -FMR or EF Decreased with silent ischemia  PPM  normal function by PACEART 10/30/20  Anemia:  f/u Ennever iv iron Rx and aranesp    TTE BMET/BNP F/U in 4-6 weeks   Signed: Jenkins Rouge 11/15/2020, 3:48 PM

## 2020-11-15 ENCOUNTER — Encounter: Payer: Self-pay | Admitting: Cardiovascular Disease

## 2020-11-15 ENCOUNTER — Other Ambulatory Visit: Payer: Self-pay

## 2020-11-15 ENCOUNTER — Ambulatory Visit (INDEPENDENT_AMBULATORY_CARE_PROVIDER_SITE_OTHER): Payer: Medicare Other | Admitting: Cardiovascular Disease

## 2020-11-15 VITALS — BP 146/62 | HR 72 | Ht 68.0 in | Wt 160.8 lb

## 2020-11-15 DIAGNOSIS — I482 Chronic atrial fibrillation, unspecified: Secondary | ICD-10-CM

## 2020-11-15 DIAGNOSIS — Z950 Presence of cardiac pacemaker: Secondary | ICD-10-CM

## 2020-11-15 DIAGNOSIS — I5032 Chronic diastolic (congestive) heart failure: Secondary | ICD-10-CM | POA: Diagnosis not present

## 2020-11-15 DIAGNOSIS — I25708 Atherosclerosis of coronary artery bypass graft(s), unspecified, with other forms of angina pectoris: Secondary | ICD-10-CM

## 2020-11-15 DIAGNOSIS — Z951 Presence of aortocoronary bypass graft: Secondary | ICD-10-CM

## 2020-11-15 NOTE — Patient Instructions (Signed)
Medication Instructions:  Your physician recommends that you continue on your current medications as directed. Please refer to the Current Medication list given to you today.  *If you need a refill on your cardiac medications before your next appointment, please call your pharmacy*   Lab Work: Your physician recommends that you return for lab work in: Tomorrow   If you have labs (blood work) drawn today and your tests are completely normal, you will receive your results only by: [place] (if you have MyChart) OR A paper copy in the mail If you have any lab test that is abnormal or we need to change your treatment, we will call you to review the results.   Testing/Procedures: Your physician has requested that you have an echocardiogram. Echocardiography is a painless test that uses sound waves to create images of your heart. It provides your doctor with information about the size and shape of your heart and how well your heart's chambers and valves are working. This procedure takes approximately one hour. There are no restrictions for this procedure.    Follow-Up: At [facility], you and your health needs are our priority.  As part of our continuing mission to provide you with exceptional heart care, we have created designated Provider Care Teams.  These Care Teams include your primary Cardiologist (physician) and Advanced Practice Providers (APPs -  Physician Assistants and Nurse Practitioners) who all work together to provide you with the care you need, when you need it.  We recommend signing up for the patient portal called "MyChart".  Sign up information is provided on this After Visit Summary.  MyChart is used to connect with patients for Virtual Visits (Telemedicine).  Patients are able to view lab/test results, encounter notes, upcoming appointments, etc.  Non-urgent messages can be sent to your provider as well.   To learn more about what you can do with MyChart, go to  [email or web address].    Your next appointment:   4-6 week(s)  The format for your next appointment:   In Person  Provider:   Jenkins Rouge, MD   Other Instructions Thank you for choosing [facility]!

## 2020-11-16 ENCOUNTER — Other Ambulatory Visit (HOSPITAL_COMMUNITY)
Admission: RE | Admit: 2020-11-16 | Discharge: 2020-11-16 | Disposition: A | Discharge status: Home / Self Care | Payer: Medicare Other | Source: Ambulatory Visit | Attending: Cardiovascular Disease | Admitting: Cardiovascular Disease

## 2020-11-16 DIAGNOSIS — I5032 Chronic diastolic (congestive) heart failure: Secondary | ICD-10-CM | POA: Diagnosis not present

## 2020-11-16 LAB — BASIC METABOLIC PANEL
Anion gap: 8 (ref 5–15)
BUN: 43 mg/dL — ABNORMAL HIGH (ref 8–23)
CO2: 22 mmol/L (ref 22–32)
Calcium: 8.5 mg/dL — ABNORMAL LOW (ref 8.9–10.3)
Chloride: 106 mmol/L (ref 98–111)
Creatinine, Ser: 1.95 mg/dL — ABNORMAL HIGH (ref 0.61–1.24)
GFR, Estimated: 32 mL/min — ABNORMAL LOW (ref >60)
Glucose, Bld: 133 mg/dL — ABNORMAL HIGH (ref 70–99)
Potassium: 4.7 mmol/L (ref 3.5–5.1)
Sodium: 136 mmol/L (ref 135–145)

## 2020-11-16 LAB — BRAIN NATRIURETIC PEPTIDE: B Natriuretic Peptide: 1436 pg/mL — ABNORMAL HIGH (ref 0.0–100.0)

## 2020-11-20 ENCOUNTER — Ambulatory Visit (HOSPITAL_COMMUNITY): Payer: Medicare Other | Attending: Internal Medicine

## 2020-11-20 ENCOUNTER — Other Ambulatory Visit: Payer: Self-pay

## 2020-11-20 DIAGNOSIS — I5032 Chronic diastolic (congestive) heart failure: Secondary | ICD-10-CM | POA: Insufficient documentation

## 2020-11-20 LAB — ECHOCARDIOGRAM COMPLETE
Area-P 1/2: 4.49 cm2
S' Lateral: 3 cm

## 2020-11-20 NOTE — Progress Notes (Signed)
Remote pacemaker transmission.   

## 2020-12-04 DIAGNOSIS — D0472 Carcinoma in situ of skin of left lower limb, including hip: Secondary | ICD-10-CM | POA: Diagnosis not present

## 2020-12-04 DIAGNOSIS — D1801 Hemangioma of skin and subcutaneous tissue: Secondary | ICD-10-CM | POA: Diagnosis not present

## 2020-12-04 DIAGNOSIS — D485 Neoplasm of uncertain behavior of skin: Secondary | ICD-10-CM | POA: Diagnosis not present

## 2020-12-04 DIAGNOSIS — C44319 Basal cell carcinoma of skin of other parts of face: Secondary | ICD-10-CM | POA: Diagnosis not present

## 2020-12-04 DIAGNOSIS — C44212 Basal cell carcinoma of skin of right ear and external auricular canal: Secondary | ICD-10-CM | POA: Diagnosis not present

## 2020-12-04 DIAGNOSIS — L821 Other seborrheic keratosis: Secondary | ICD-10-CM | POA: Diagnosis not present

## 2020-12-04 DIAGNOSIS — Z85828 Personal history of other malignant neoplasm of skin: Secondary | ICD-10-CM | POA: Diagnosis not present

## 2020-12-04 DIAGNOSIS — L570 Actinic keratosis: Secondary | ICD-10-CM | POA: Diagnosis not present

## 2020-12-06 ENCOUNTER — Other Ambulatory Visit: Payer: Self-pay

## 2020-12-06 ENCOUNTER — Encounter: Payer: Self-pay | Admitting: Hematology & Oncology

## 2020-12-06 ENCOUNTER — Inpatient Hospital Stay: Payer: Medicare Other

## 2020-12-06 ENCOUNTER — Inpatient Hospital Stay (HOSPITAL_BASED_OUTPATIENT_CLINIC_OR_DEPARTMENT_OTHER): Payer: Medicare Other | Admitting: Hematology & Oncology

## 2020-12-06 ENCOUNTER — Inpatient Hospital Stay: Payer: Medicare Other | Attending: Hematology & Oncology

## 2020-12-06 VITALS — BP 149/63 | HR 86 | Temp 98.3°F | Resp 19 | Wt 159.0 lb

## 2020-12-06 DIAGNOSIS — D508 Other iron deficiency anemias: Secondary | ICD-10-CM

## 2020-12-06 DIAGNOSIS — D631 Anemia in chronic kidney disease: Secondary | ICD-10-CM

## 2020-12-06 DIAGNOSIS — D509 Iron deficiency anemia, unspecified: Secondary | ICD-10-CM

## 2020-12-06 DIAGNOSIS — D500 Iron deficiency anemia secondary to blood loss (chronic): Secondary | ICD-10-CM | POA: Diagnosis not present

## 2020-12-06 DIAGNOSIS — Z79899 Other long term (current) drug therapy: Secondary | ICD-10-CM | POA: Diagnosis not present

## 2020-12-06 DIAGNOSIS — N189 Chronic kidney disease, unspecified: Secondary | ICD-10-CM | POA: Diagnosis not present

## 2020-12-06 LAB — CMP (CANCER CENTER ONLY)
ALT: 17 U/L (ref 0–44)
AST: 23 U/L (ref 15–41)
Albumin: 3.9 g/dL (ref 3.5–5.0)
Alkaline Phosphatase: 72 U/L (ref 38–126)
Anion gap: 7 (ref 5–15)
BUN: 34 mg/dL — ABNORMAL HIGH (ref 8–23)
CO2: 29 mmol/L (ref 22–32)
Calcium: 9.2 mg/dL (ref 8.9–10.3)
Chloride: 104 mmol/L (ref 98–111)
Creatinine: 1.73 mg/dL — ABNORMAL HIGH (ref 0.61–1.24)
GFR, Estimated: 37 mL/min — ABNORMAL LOW (ref >60)
Glucose, Bld: 120 mg/dL — ABNORMAL HIGH (ref 70–99)
Potassium: 4.9 mmol/L (ref 3.5–5.1)
Sodium: 140 mmol/L (ref 135–145)
Total Bilirubin: 0.7 mg/dL (ref 0.3–1.2)
Total Protein: 6.6 g/dL (ref 6.5–8.1)

## 2020-12-06 LAB — CBC WITH DIFFERENTIAL (CANCER CENTER ONLY)
Abs Immature Granulocytes: 0.02 10*3/uL (ref 0.00–0.07)
Basophils Absolute: 0 10*3/uL (ref 0.0–0.1)
Basophils Relative: 0 %
Eosinophils Absolute: 0.2 10*3/uL (ref 0.0–0.5)
Eosinophils Relative: 3 %
HCT: 30.5 % — ABNORMAL LOW (ref 39.0–52.0)
Hemoglobin: 9.9 g/dL — ABNORMAL LOW (ref 13.0–17.0)
Immature Granulocytes: 0 %
Lymphocytes Relative: 25 %
Lymphs Abs: 1.6 10*3/uL (ref 0.7–4.0)
MCH: 29.8 pg (ref 26.0–34.0)
MCHC: 32.5 g/dL (ref 30.0–36.0)
MCV: 91.9 fL (ref 80.0–100.0)
Monocytes Absolute: 0.7 10*3/uL (ref 0.1–1.0)
Monocytes Relative: 11 %
Neutro Abs: 3.9 10*3/uL (ref 1.7–7.7)
Neutrophils Relative %: 61 %
Platelet Count: 129 10*3/uL — ABNORMAL LOW (ref 150–400)
RBC: 3.32 MIL/uL — ABNORMAL LOW (ref 4.22–5.81)
RDW: 16 % — ABNORMAL HIGH (ref 11.5–15.5)
WBC Count: 6.3 10*3/uL (ref 4.0–10.5)
nRBC: 0 % (ref 0.0–0.2)

## 2020-12-06 LAB — RETICULOCYTES
Immature Retic Fract: 14 % (ref 2.3–15.9)
RBC.: 3.31 MIL/uL — ABNORMAL LOW (ref 4.22–5.81)
Retic Count, Absolute: 53 10*3/uL (ref 19.0–186.0)
Retic Ct Pct: 1.6 % (ref 0.4–3.1)

## 2020-12-06 MED ORDER — DARBEPOETIN ALFA 300 MCG/0.6ML IJ SOSY
300.0000 ug | PREFILLED_SYRINGE | Freq: Once | INTRAMUSCULAR | Status: AC
  Administered 2020-12-06: 300 ug via SUBCUTANEOUS
  Filled 2020-12-06: qty 0.6

## 2020-12-06 NOTE — Progress Notes (Signed)
CARDIOLOGY CONSULT NOTE       Patient ID: [name] MRN: [id number] DOB/AGE: July 22, 1928 87 y.o.   Primary Physician: [name], DO Primary Cardiologist: Johnsie Cancel Primary EP:  Allred   HPI:  87 y.o. followed mostly by Dr Rayann Heman. Post St Jude PPM for CHB in 2019 PPM is MRI safe. He is chronically anticoagulated low dose due to CRF for PAF. Failed multiple previous cardioversion's  Distant history of CABG in 1999 LIMA to LAD, SVG to IM, Sequenced SVG OM1OM2, SVG to D1 and SVG to PDA. Grafts patent by cath in 2011 and non ischemic myovue in 2017 with EF 70%  Echo in 2018 with moderate MR   Sees Ennever for anemia has had Aranesp and iron infusions Reviewed PACEART from 10/30/20 normal function   Seen in ED 11/12/20 more dyspnea PND and edema Normally takes lasix 20 mg daily BNP very  Elevated 1533 and CXR consistent with CHF Rx iv lasix Advised admission but patient wanted To go home Cr elevated 1.93 and K 4.5 , Hct 29.9   ECG:  afib with V pacing rate 60   Prior to admission was not taking lasix daily just as needed Since ER 40 mg daily BNP 1436 7.22.22 BUN/Cr 34/1.73 12/06/20  TTE: 11/20/20 EF 40-45% severe bi-atrial enlargement moderate MR small PFO Aortic root 3.8   Son with him today and daughter nearby He has been married for 33 years   Discussed fact that EF worse, MR , afib all contribute to CHF. Given age, renal failure and lack of  Chest pain would not cath.  Having heaviness in both legs worried about claudication     ROS All other systems reviewed and negative except as noted above  Past Medical History:  Diagnosis Date   A-fib ([place])    Basal cell carcinoma    skin   Bigeminal rhythm    Bradycardia 06/21/2017   Cancer of anterior wall of urinary bladder ([place]) 07/18/2019   Cataract    Chronic renal insufficiency, stage 3 (moderate) ([place]) 2018   GFR 30s-40s   Coronary artery disease    post bypass   CVD (cardiovascular disease)    Diabetes mellitus  without complication (HCC)    type 2 diet controlled   Diverticular disease    Easy bruising    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   Gout    Hematuria 06/30/2019   Hernia    Hyperkalemia 05/06/2017   Hyperlipidemia    Hypertension    IBS (irritable bowel syndrome)    Iron deficiency anemia 02/04/2016   Macular degeneration    Microscopic colitis    PVD (peripheral vascular disease) ([place])     Family History  Problem Relation Age of Onset   Stroke Mother    Coronary artery disease Father    Heart disease Father    Melanoma Sister    Colon cancer Neg Hx     Social History   Socioeconomic History   Marital status: Married    Spouse name: Not on file   Number of children: Not on file   Years of education: Not on file   Highest education level: Not on file  Occupational History   Occupation: retired  Tobacco Use   Smoking status: Former   Smokeless tobacco: Former    Types: Chew    Quit date: 02/03/1969  Vaping Use   Vaping Use: Never used  Substance and Sexual Activity   Alcohol use: No  Alcohol/week: 0.0 standard drinks   Drug use: No   Sexual activity: Never  Other Topics Concern   Not on file  Social History Narrative   Married to [name], 2 children [name] and [name].   Some college. Retired from [employer].   Drinks caffeine, uses herbal remedies, takes a daily vitamin.   Wears his seatbelt, smoke detector at home, firearms in the home.   Wears a hearing aid.   Feels safe in her relationships.   Social Determinants of Health   Financial Resource Strain: Not on file  Food Insecurity: Not on file  Transportation Needs: Not on file  Physical Activity: Not on file  Stress: Not on file  Social Connections: Not on file  Intimate Partner Violence: Not on file    Past Surgical History:  Procedure Laterality Date   CARDIAC CATHETERIZATION  2006   CARDIOVERSION N/A 02/22/2013   Procedure: CARDIOVERSION;  Surgeon: [name], MD;  Location: [facility];  Service: Cardiovascular;  Laterality: N/A;   CARDIOVERSION N/A 11/15/2014   Procedure: CARDIOVERSION;  Surgeon: [name], MD;  Location: [place];  Service: Cardiovascular;  Laterality: N/A;   CARDIOVERSION N/A 04/01/2017   Procedure: CARDIOVERSION;  Surgeon: Larey Dresser, MD;  Location: [facility];  Service: Cardiovascular;  Laterality: N/A;   CAROTID ENDARTERECTOMY  2009/ 1993   left/ right   COLONOSCOPY  03/17/2005   The colon is normal.   CORONARY ARTERY BYPASS GRAFT  1999   CYSTOSCOPY W/ RETROGRADES Bilateral 07/14/2019   Procedure: CYSTOSCOPY WITH RETROGRADE PYELOGRAM;  Surgeon: Franchot Gallo, MD;  Location[facility];  Service: Urology;  Laterality: Bilateral;   ESOPHAGOGASTRODUODENOSCOPY  03/17/2005   Normal esophagus. Normal Stomanch. Normal duodenum.    EYE SURGERY     eyelid repair   INGUINAL HERNIA REPAIR  02/11/2012   Procedure: LAPAROSCOPIC BILATERAL INGUINAL HERNIA REPAIR;  Surgeon: Pedro Earls, MD;  Location: [facility];  Service: General;  Laterality: Bilateral;   PACEMAKER IMPLANT N/A 07/28/2017   St Jude Medical Assurity MRI conditional  dual-chamber pacemaker for symptomatic second degree AV block by Dr Rayann Heman   SKIN CANCER EXCISION     right ear x 3   TRANSURETHRAL RESECTION OF BLADDER TUMOR N/A 09/05/2019   Procedure: TRANSURETHRAL RESECTION OF BLADDER TUMOR (TURBT);  Surgeon: Franchot Gallo, MD;  Location: [facility];  Service: Urology;  Laterality: N/A;   TRANSURETHRAL RESECTION OF BLADDER TUMOR WITH MITOMYCIN-C N/A 07/14/2019   Procedure: TRANSURETHRAL RESECTION OF BLADDER TUMOR WITH GEMCITABINE IN PACU;  Surgeon: Franchot Gallo, MD;  Location: [facility];  Service: Urology;  Laterality: N/A;      Current Outpatient Medications:    ACCU-CHEK FASTCLIX LANCETS MISC, Check fasting blood sugar once daily, Disp: 100 each, Rfl: 3   allopurinol (ZYLOPRIM) 300 MG tablet, Take 0.5  tablets (150 mg total) by mouth daily., Disp: 45 tablet, Rfl: 3   amLODipine (NORVASC) 5 MG tablet, Take 1 tablet (5 mg total) by mouth daily., Disp: 90 tablet, Rfl: 1   atorvastatin (LIPITOR) 20 MG tablet, TAKE 1 TABLET AT BEDTIME, Disp: 90 tablet, Rfl: 3   blood glucose meter kit and supplies, Dispense based on patient and insurance preference. Use up to four times daily as directed. R73.03, Disp: 1 each, Rfl: 0   carvedilol (COREG) 6.25 MG tablet, Take 1 tablet (6.25 mg total) by mouth 2 (two) times daily with a meal., Disp: 180 tablet, Rfl: 3   Cholecalciferol 125  MCG (5000 UT) capsule, Take 5,000 Units by mouth daily., Disp: , Rfl:    CHROMIUM GTF PO, Take 1 capsule by mouth every evening. , Disp: , Rfl:    Cinnamon 500 MG TABS, Take 2 tablets by mouth at bedtime. , Disp: , Rfl:    dicyclomine (BENTYL) 10 MG capsule, TAKE 1 CAPSULE (10 MG TOTAL) BY MOUTH 4 (FOUR) TIMES DAILY - BEFORE MEALS AND AT BEDTIME., Disp: 360 capsule, Rfl: 1   diphenoxylate-atropine (LOMOTIL) 2.5-0.025 MG tablet, 1 tablet. Pt takes PRN, Disp: , Rfl:    ELIQUIS 2.5 MG TABS tablet, TAKE 1 TABLET TWICE A DAY, Disp: 180 tablet, Rfl: 1   finasteride (PROSCAR) 5 MG tablet, Take 5 mg by mouth daily., Disp: , Rfl: 3   fish oil-omega-3 fatty acids 1000 MG capsule, Take 1 g by mouth daily. , Disp: , Rfl:    furosemide (LASIX) 20 MG tablet, Take 40 mg by mouth as needed., Disp: , Rfl:    glucose blood (ACCU-CHEK GUIDE) test strip, Check fasting blood sugar once daily, Disp: 100 each, Rfl: 3   latanoprost (XALATAN) 0.005 % ophthalmic solution, Place 1 drop into both eyes at bedtime., Disp: , Rfl:    Multiple Vitamins-Minerals (PRESERVISION AREDS PO), Take 1 tablet by mouth 2 (two) times daily. , Disp: , Rfl:    potassium chloride (KLOR-CON) 10 MEQ tablet, Take 1 tablet (10 mEq total) by mouth daily., Disp: 90 tablet, Rfl: 3   Probiotic Product (PROBIOTIC PO), Take 1 tablet by mouth daily., Disp: , Rfl:    tamsulosin (FLOMAX) 0.4  MG CAPS capsule, Take 0.4 mg by mouth., Disp: , Rfl:    timolol (TIMOPTIC) 0.5 % ophthalmic solution, Place 1 drop into both eyes 2 (two) times daily. , Disp: , Rfl:    UNABLE TO FIND, Med Name: Mito Q, Disp: , Rfl:    VANADYL SULFATE PO, Take 1 capsule by mouth every evening. , Disp: , Rfl:    zinc gluconate 50 MG tablet, Take 50 mg by mouth daily. , Disp: , Rfl:  No current facility-administered medications for this visit.  Facility-Administered Medications Ordered in Other Visits:    gemcitabine (GEMZAR) chemo syringe for bladder instillation 2,000 mg, 2,000 mg, Bladder Instillation, Once, Franchot Gallo, MD    Physical Exam: There were no vitals taken for this visit.   Elderly male JVP markedly elevated with A/V waves  Lungs clear Apical MR  JVP mildly elevated Plus one LE edema PPM under left clavicle  Palpable peripheral pulses   Labs:   Lab Results  Component Value Date   WBC 6.3 12/06/2020   HGB 9.9 (L) 12/06/2020   HCT 30.5 (L) 12/06/2020   MCV 91.9 12/06/2020   PLT 129 (L) 12/06/2020    Recent Labs  Lab 12/06/20 1123  NA 140  K 4.9  CL 104  CO2 29  BUN 34*  CREATININE 1.73*  CALCIUM 9.2  PROT 6.6  BILITOT 0.7  ALKPHOS 72  ALT 17  AST 23  GLUCOSE 120*    Lab Results  Component Value Date   CKTOTAL 42 05/25/2018   CKMB 2.3 06/16/2011   TROPONINI <0.03 06/21/2017     Lab Results  Component Value Date   CHOL 125 11/24/2019   CHOL 110 06/21/2017   CHOL 112 06/09/2013   Lab Results  Component Value Date   HDL 57 11/24/2019   HDL 46 06/21/2017   HDL 43.20 06/09/2013   Lab Results  Component Value Date  LDLCALC 54 11/24/2019   LDLCALC 55 06/21/2017   LDLCALC 55 06/09/2013   Lab Results  Component Value Date   TRIG 67 11/24/2019   TRIG 44 06/21/2017   TRIG 69.0 06/09/2013   Lab Results  Component Value Date   CHOLHDL 2.2 11/24/2019   CHOLHDL 2.4 06/21/2017   CHOLHDL 3 06/09/2013   Lab Results  Component Value Date    LDLDIRECT 49 01/15/2017      Radiology: DG Chest 2 View  Result Date: 11/12/2020 CLINICAL DATA:  Shortness of breath. EXAM: CHEST - 2 VIEW COMPARISON:  One-view chest x-ray 07/29/2017 FINDINGS: Heart is enlarged. Atherosclerotic changes present at the aortic arch. Patient is status post median sternotomy for CABG. Bilateral pleural effusions are present. Moderate pulmonary vascular congestion is noted. Bibasilar airspace opacities likely reflect atelectasis. IMPRESSION: Cardiomegaly with moderate pulmonary vascular congestion and bilateral pleural effusions compatible with congestive heart failure. Electronically Signed   By: San Morelle M.D.   On: 11/12/2020 12:16   ECHOCARDIOGRAM COMPLETE  Result Date: 11/20/2020    ECHOCARDIOGRAM REPORT   Patient Name:   [name] Date of Exam: 11/20/2020 Medical Rec #:  [id number]        Height:       68.0 in Accession #:    [id number]       Weight:       160.8 lb Date of Birth:  Apr 26, 1929         BSA:          1.863 m Patient Age:    47 years         BP:           146/62 mmHg Patient Gender: M                HR:           60 bpm. Exam Location:  [facility] Procedure: 2D Echo, Cardiac Doppler and Color Doppler Indications:    I50.32 CHF  History:        Patient has prior history of Echocardiogram examinations, most                 recent 04/03/2017. CHF, Prior CABG and Pacemaker,                 Arrythmias:Atrial Fibrillation; Risk Factors:Former Smoker and                 Hypertension.  Sonographer:    Coralyn Helling RDCS Referring Phys: [place]  1. Left ventricular ejection fraction, by estimation, is 40 to 45%. The left ventricle has moderately decreased function. The left ventricle demonstrates global hypokinesis with regional variation. There is moderate left ventricular hypertrophy. Left ventricular diastolic function could not be evaluated. Elevated left ventricular end-diastolic pressure.  2. Right ventricular systolic  function is low normal. The right ventricular size is normal. There is mildly elevated pulmonary artery systolic pressure. The estimated right ventricular systolic pressure is 36.4 mmHg.  3. Left atrial size was severely dilated.  4. Right atrial size was severely dilated.  5. The mitral valve is abnormal. Moderate mitral valve regurgitation.  6. Tricuspid valve regurgitation is mild to moderate.  7. The aortic valve is tricuspid. Aortic valve regurgitation is not visualized.  8. Aortic dilatation noted. There is borderline dilatation of the aortic root, measuring 38 mm.  9. The inferior vena cava is dilated in size with >50% respiratory variability, suggesting right atrial pressure of 8  mmHg. 10. Evidence of atrial level shunting detected by color flow Doppler. There is a small patent foramen ovale with predominantly left to right shunting across the atrial septum. Comparison(s): Changes from prior study are noted. 04/03/2017: LVEF 55-60%. FINDINGS  Left Ventricle: Left ventricular ejection fraction, by estimation, is 40 to 45%. The left ventricle has mildly decreased function. The left ventricle demonstrates global hypokinesis. The left ventricular internal cavity size was normal in size. There is  moderate left ventricular hypertrophy. Left ventricular diastolic function could not be evaluated due to atrial fibrillation. Left ventricular diastolic function could not be evaluated. Elevated left ventricular end-diastolic pressure. Right Ventricle: The right ventricular size is normal. No increase in right ventricular wall thickness. Right ventricular systolic function is low normal. There is mildly elevated pulmonary artery systolic pressure. The tricuspid regurgitant velocity is 2.97 m/s, and with an assumed right atrial pressure of 8 mmHg, the estimated right ventricular systolic pressure is 35.4 mmHg. Left Atrium: Left atrial size was severely dilated. Right Atrium: Right atrial size was severely dilated.  Pericardium: There is no evidence of pericardial effusion. Mitral Valve: The mitral valve is abnormal. There is moderate thickening of the posterior and anterior mitral valve leaflet(s). Moderate mitral valve regurgitation. Tricuspid Valve: The tricuspid valve is grossly normal. Tricuspid valve regurgitation is mild to moderate. Aortic Valve: The aortic valve is tricuspid. Aortic valve regurgitation is not visualized. Pulmonic Valve: The pulmonic valve was grossly normal. Pulmonic valve regurgitation is trivial. Aorta: Aortic dilatation noted. There is borderline dilatation of the aortic root, measuring 38 mm. Venous: The inferior vena cava is dilated in size with greater than 50% respiratory variability, suggesting right atrial pressure of 8 mmHg. IAS/Shunts: Evidence of atrial level shunting detected by color flow Doppler. A small patent foramen ovale is detected with predominantly left to right shunting across the atrial septum. Additional Comments: A device lead is visualized.  LEFT VENTRICLE PLAX 2D LVIDd:         4.40 cm  Diastology LVIDs:         3.00 cm  LV e' medial:    6.09 cm/s LV PW:         1.50 cm  LV E/e' medial:  24.8 LV IVS:        1.30 cm  LV e' lateral:   9.03 cm/s LVOT diam:     2.10 cm  LV E/e' lateral: 16.7 LV SV:         61 LV SV Index:   33 LVOT Area:     3.46 cm  RIGHT VENTRICLE             IVC RV S prime:     10.00 cm/s  IVC diam: 2.20 cm TAPSE (M-mode): 1.3 cm RVSP:           43.3 mmHg LEFT ATRIUM              Index       RIGHT ATRIUM           Index LA diam:        4.90 cm  2.63 cm/m  RA Pressure: 8.00 mmHg LA Vol (A2C):   102.0 ml 54.76 ml/m RA Area:     26.60 cm LA Vol (A4C):   108.0 ml 57.98 ml/m RA Volume:   93.70 ml  50.30 ml/m LA Biplane Vol: 105.0 ml 56.37 ml/m  AORTIC VALVE LVOT Vmax:   79.10 cm/s LVOT Vmean:  53.400 cm/s LVOT VTI:  0.176 m  AORTA Ao Root diam: 3.80 cm Ao Asc diam:  3.40 cm MITRAL VALVE                TRICUSPID VALVE MV Area (PHT): 4.49 cm     TR Peak  grad:   35.3 mmHg MV Decel Time: 169 msec     TR Vmax:        297.00 cm/s MV E velocity: 151.00 cm/s  Estimated RAP:  8.00 mmHg MV A velocity: 42.70 cm/s   RVSP:           43.3 mmHg MV E/A ratio:  3.54                             SHUNTS                             Systemic VTI:  0.18 m                             Systemic Diam: 2.10 cm Lyman Bishop MD Electronically signed by Lyman Bishop MD Signature Date/Time: 11/20/2020/11:04:12 AM    Final     EKG: afib with v pacing rate 60    ASSESSMENT AND PLAN:   CAD/CABG:  Distant CABG no chest pain observe Not ideal to cath given his age and renal failure  AFib:  chronic rate control fine low dose anticoagulation with age and CRF CHF:  EF 40-45% GDMT limited by age and CRF better with daily lasix RV ok no pulmonary HTN and MR stable moderate continue medical Rx PPM  normal function by PACEART 10/30/20  Anemia:  f/u Ennever iv iron Rx and aranesp Hct 30.5 12/06/20  Bladder Cancer:  no hematuria Rx intravesicular Rx March 2022 F/U [place]  Claudication:  ? Doubt but quality of life issue with heaviness in legs f/u ABI/Duplex    F/U 6 months  ABI/LE arterial duplex   Signed: Jenkins Rouge 12/06/2020, 4:59 PM

## 2020-12-06 NOTE — Patient Instructions (Signed)
Darbepoetin Alfa injection What is this medication? DARBEPOETIN ALFA (dar be POE e tin AL fa) helps your body make more red blood cells. It is used to treat anemia caused by chronic kidney failure and chemotherapy. This medicine may be used for other purposes; ask your health care provider or pharmacist if you have questions. COMMON BRAND NAME(S): Aranesp What should I tell my care team before I take this medication? They need to know if you have any of these conditions: blood clotting disorders or history of blood clots cancer patient not on chemotherapy cystic fibrosis heart disease, such as angina, heart failure, or a history of a heart attack hemoglobin level of 12 g/dL or greater high blood pressure low levels of folate, iron, or vitamin B12 seizures an unusual or allergic reaction to darbepoetin, erythropoietin, albumin, hamster proteins, latex, other medicines, foods, dyes, or preservatives pregnant or trying to get pregnant breast-feeding How should I use this medication? This medicine is for injection into a vein or under the skin. It is usually given by a health care professional in a hospital or clinic setting. If you get this medicine at home, you will be taught how to prepare and give this medicine. Use exactly as directed. Take your medicine at regular intervals. Do not take your medicine more often than directed. It is important that you put your used needles and syringes in a special sharps container. Do not put them in a trash can. If you do not have a sharps container, call your pharmacist or healthcare provider to get one. A special MedGuide will be given to you by the pharmacist with each prescription and refill. Be sure to read this information carefully each time. Talk to your pediatrician regarding the use of this medicine in children. While this medicine may be used in children as young as 1 month of age for selected conditions, precautions do apply. Overdosage: If you  think you have taken too much of this medicine contact a poison control center or emergency room at once. NOTE: This medicine is only for you. Do not share this medicine with others. What if I miss a dose? If you miss a dose, take it as soon as you can. If it is almost time for your next dose, take only that dose. Do not take double or extra doses. What may interact with this medication? Do not take this medicine with any of the following medications: epoetin alfa This list may not describe all possible interactions. Give your health care provider a list of all the medicines, herbs, non-prescription drugs, or dietary supplements you use. Also tell them if you smoke, drink alcohol, or use illegal drugs. Some items may interact with your medicine. What should I watch for while using this medication? Your condition will be monitored carefully while you are receiving this medicine. You may need blood work done while you are taking this medicine. This medicine may cause a decrease in vitamin B6. You should make sure that you get enough vitamin B6 while you are taking this medicine. Discuss the foods you eat and the vitamins you take with your health care professional. What side effects may I notice from receiving this medication? Side effects that you should report to your doctor or health care professional as soon as possible: allergic reactions like skin rash, itching or hives, swelling of the face, lips, or tongue breathing problems changes in vision chest pain confusion, trouble speaking or understanding feeling faint or lightheaded, falls high blood pressure   muscle aches or pains pain, swelling, warmth in the leg rapid weight gain severe headaches sudden numbness or weakness of the face, arm or leg trouble walking, dizziness, loss of balance or coordination seizures (convulsions) swelling of the ankles, feet, hands unusually weak or tired Side effects that usually do not require medical  attention (report to your doctor or health care professional if they continue or are bothersome): diarrhea fever, chills (flu-like symptoms) headaches nausea, vomiting redness, stinging, or swelling at site where injected This list may not describe all possible side effects. Call your doctor for medical advice about side effects. You may report side effects to FDA at 1-800-FDA-1088. Where should I keep my medication? Keep out of the reach of children. Store in a refrigerator between 2 and 8 degrees C (36 and 46 degrees F). Do not freeze. Do not shake. Throw away any unused portion if using a single-dose vial. Throw away any unused medicine after the expiration date. NOTE: This sheet is a summary. It may not cover all possible information. If you have questions about this medicine, talk to your doctor, pharmacist, or health care provider.  2022 Elsevier/Gold Standard (2017-04-29 16:44:20)  

## 2020-12-06 NOTE — Progress Notes (Signed)
Hematology and Oncology Follow Up Visit  [name] [id number] December 08, 1928 87 y.o. 12/06/2020   Principle Diagnosis:  Erythropoietin deficiency anemia Iron deficiency anemia  Current Therapy:   IV iron as indicated -- feraheme given on 07/27/2019 Aranesp 300 g sq prn hemoglobin < 11 - last dose given on  08/10/2020   Interim History:  Mr. Stary is here today for follow-up.  Overall, he is doing fairly well.  I do see him in church on Sundays.  So far, everything has been doing okay for him.  I am a little surprised that his hemoglobin is not higher.  Typically with Aranesp, he does respond quite well.  The last iron study that we have on him were back in June which showed a ferritin of 433 with an iron saturation of 28%.  Is probably been about a couple months since he did have any Aranesp.  As such, we may have to get him in sooner.  His appetite is doing okay.  There is not any issues with respect to the bladder cancer.  He has superficial bladder cancer.  This was treated with intravesicular therapy back in March 2022.  He has had no fever.  There has been no cough.  He does have some leg swelling.  He is on Lasix.  Overall, his performance status is ECOG 2.     Medications:  Allergies as of 12/06/2020       Reactions   Penicillins Hives   Has patient had a PCN reaction causing immediate rash, facial/tongue/throat swelling, SOB or lightheadedness with hypotension: No Has patient had a PCN reaction causing severe rash involving mucus membranes or skin necrosis: Yes Has patient had a PCN reaction that required hospitalization: No Has patient had a PCN reaction occurring within the last 10 years: No If all of the above answers are "NO", then may proceed with Cephalosporin use.   Vancomycin Other (See Comments)   Edema and myalgia        Medication List        Accurate as of December 06, 2020 12:13 PM. If you have any questions, ask your nurse or doctor.           Accu-Chek FastClix Lancets Misc Check fasting blood sugar once daily   Accu-Chek Guide test strip Generic drug: glucose blood Check fasting blood sugar once daily   allopurinol 300 MG tablet Commonly known as: ZYLOPRIM Take 0.5 tablets (150 mg total) by mouth daily.   amLODipine 5 MG tablet Commonly known as: NORVASC Take 1 tablet (5 mg total) by mouth daily.   atorvastatin 20 MG tablet Commonly known as: LIPITOR TAKE 1 TABLET AT BEDTIME   blood glucose meter kit and supplies Dispense based on patient and insurance preference. Use up to four times daily as directed. R73.03   carvedilol 6.25 MG tablet Commonly known as: COREG Take 1 tablet (6.25 mg total) by mouth 2 (two) times daily with a meal.   Cholecalciferol 125 MCG (5000 UT) capsule Take 5,000 Units by mouth daily.   CHROMIUM GTF PO Take 1 capsule by mouth every evening.   Cinnamon 500 MG Tabs Take 2 tablets by mouth at bedtime.   dicyclomine 10 MG capsule Commonly known as: BENTYL TAKE 1 CAPSULE (10 MG TOTAL) BY MOUTH 4 (FOUR) TIMES DAILY - BEFORE MEALS AND AT BEDTIME.   diphenoxylate-atropine 2.5-0.025 MG tablet Commonly known as: LOMOTIL 1 tablet. Pt takes PRN   Eliquis 2.5 MG Tabs tablet Generic drug: apixaban  TAKE 1 TABLET TWICE A DAY   finasteride 5 MG tablet Commonly known as: PROSCAR Take 5 mg by mouth daily.   fish oil-omega-3 fatty acids 1000 MG capsule Take 1 g by mouth daily.   furosemide 20 MG tablet Commonly known as: LASIX Take 40 mg by mouth as needed.   latanoprost 0.005 % ophthalmic solution Commonly known as: XALATAN Place 1 drop into both eyes at bedtime.   potassium chloride 10 MEQ tablet Commonly known as: KLOR-CON Take 1 tablet (10 mEq total) by mouth daily.   PRESERVISION AREDS PO Take 1 tablet by mouth 2 (two) times daily.   PROBIOTIC PO Take 1 tablet by mouth daily.   tamsulosin 0.4 MG Caps capsule Commonly known as: FLOMAX Take 0.4 mg by mouth.   timolol  0.5 % ophthalmic solution Commonly known as: TIMOPTIC Place 1 drop into both eyes 2 (two) times daily.   UNABLE TO FIND Med Name: Mito Q   VANADYL SULFATE PO Take 1 capsule by mouth every evening.   zinc gluconate 50 MG tablet Take 50 mg by mouth daily.        Allergies:  Allergies  Allergen Reactions   Penicillins Hives    Has patient had a PCN reaction causing immediate rash, facial/tongue/throat swelling, SOB or lightheadedness with hypotension: No Has patient had a PCN reaction causing severe rash involving mucus membranes or skin necrosis: Yes Has patient had a PCN reaction that required hospitalization: No Has patient had a PCN reaction occurring within the last 10 years: No If all of the above answers are "NO", then may proceed with Cephalosporin use.    Vancomycin Other (See Comments)    Edema and myalgia    Past Medical History, Surgical history, Social history, and Family History were reviewed and updated.  Review of Systems: Review of Systems  Constitutional: Negative.   HENT: Negative.    Eyes: Negative.   Respiratory: Negative.    Cardiovascular:  Positive for palpitations.  Gastrointestinal: Negative.   Genitourinary: Negative.   Musculoskeletal: Negative.   Skin: Negative.   Neurological: Negative.   Endo/Heme/Allergies: Negative.   Psychiatric/Behavioral: Negative.      Physical Exam:  weight is 159 lb (72.1 kg). His oral temperature is 98.3 F (36.8 C). His blood pressure is 149/63 (abnormal) and his pulse is 86. His respiration is 19 and oxygen saturation is 98%.   Wt Readings from Last 3 Encounters:  12/06/20 159 lb (72.1 kg)  11/15/20 160 lb 12.8 oz (72.9 kg)  11/12/20 161 lb (73 kg)    Physical Exam Vitals reviewed.  HENT:     Head: Normocephalic and atraumatic.  Eyes:     Pupils: Pupils are equal, round, and reactive to light.  Cardiovascular:     Rate and Rhythm: Normal rate and regular rhythm.     Heart sounds: Normal heart  sounds.  Pulmonary:     Effort: Pulmonary effort is normal.     Breath sounds: Normal breath sounds.  Abdominal:     General: Bowel sounds are normal.     Palpations: Abdomen is soft.  Musculoskeletal:        General: No tenderness or deformity. Normal range of motion.     Cervical back: Normal range of motion.  Lymphadenopathy:     Cervical: No cervical adenopathy.  Skin:    General: Skin is warm and dry.     Findings: No erythema or rash.  Neurological:     Mental Status: He is  alert and oriented to person, place, and time.  Psychiatric:        Behavior: Behavior normal.        Thought Content: Thought content normal.        Judgment: Judgment normal.     Lab Results  Component Value Date   WBC 6.3 12/06/2020   HGB 9.9 (L) 12/06/2020   HCT 30.5 (L) 12/06/2020   MCV 91.9 12/06/2020   PLT 129 (L) 12/06/2020   Lab Results  Component Value Date   FERRITIN 433 (H) 10/05/2020   IRON 76 10/05/2020   TIBC 273 10/05/2020   UIBC 197 10/05/2020   IRONPCTSAT 28 10/05/2020   Lab Results  Component Value Date   RETICCTPCT 1.6 12/06/2020   RBC 3.32 (L) 12/06/2020   RBC 3.31 (L) 12/06/2020   No results found for: KPAFRELGTCHN, LAMBDASER, KAPLAMBRATIO No results found for: Kandis Cocking, IGMSERUM Lab Results  Component Value Date   TOTALPROTELP 6.0 (L) 12/26/2015   ALBUMINELP 3.4 (L) 12/26/2015   A1GS 0.4 (H) 12/26/2015   A2GS 0.8 12/26/2015   BETS 0.4 12/26/2015   BETA2SER 0.3 12/26/2015   GAMS 0.8 12/26/2015   MSPIKE Not Observed 01/29/2016   SPEI SEE NOTE 12/26/2015     Chemistry      Component Value Date/Time   NA 140 12/06/2020 1123   NA 141 08/11/2017 0924   NA 145 04/09/2017 0905   NA 140 03/28/2016 0853   K 4.9 12/06/2020 1123   K 4.5 04/09/2017 0905   K 4.4 03/28/2016 0853   CL 104 12/06/2020 1123   CL 105 04/09/2017 0905   CO2 29 12/06/2020 1123   CO2 27 04/09/2017 0905   CO2 19 (L) 03/28/2016 0853   BUN 34 (H) 12/06/2020 1123   BUN 27 08/11/2017  0924   BUN 33 (H) 04/09/2017 0905   BUN 34.4 (H) 03/28/2016 0853   CREATININE 1.73 (H) 12/06/2020 1123   CREATININE 1.72 (H) 08/18/2017 1543   CREATININE 1.7 (H) 03/28/2016 0853   GLU 113 07/24/2016 0000      Component Value Date/Time   CALCIUM 9.2 12/06/2020 1123   CALCIUM 9.2 04/09/2017 0905   CALCIUM 9.2 03/28/2016 0853   ALKPHOS 72 12/06/2020 1123   ALKPHOS 69 04/09/2017 0905   ALKPHOS 85 03/28/2016 0853   AST 23 12/06/2020 1123   AST 23 03/28/2016 0853   ALT 17 12/06/2020 1123   ALT 27 04/09/2017 0905   ALT 37 03/28/2016 0853   BILITOT 0.7 12/06/2020 1123   BILITOT 0.79 03/28/2016 0853      Impression and Plan: Mr. Adkison is a very pleasant 87 yo caucasian gentleman with both erythropoietin deficiency and iron deficiency anemia.    Again, a little surprised by the fact that his hemoglobin has not gotten better.  We will give him Aranesp today.  We will see what his iron studies look like.  I would like to see him back in a month.  We really have to be more aggressive with the hemoglobin so we can try to get it higher so he will have a better quality of life.   [name], MD 8/11/202212:13 PM

## 2020-12-07 LAB — IRON AND TIBC
Iron: 60 ug/dL (ref 42–163)
Saturation Ratios: 22 % (ref 20–55)
TIBC: 271 ug/dL (ref 202–409)
UIBC: 211 ug/dL (ref 117–376)

## 2020-12-07 LAB — FERRITIN: Ferritin: 477 ng/mL — ABNORMAL HIGH (ref 24–336)

## 2020-12-12 ENCOUNTER — Other Ambulatory Visit: Payer: Self-pay

## 2020-12-12 MED ORDER — FUROSEMIDE 20 MG PO TABS: 40.0000 mg | ORAL_TABLET | ORAL | 0 refills | Status: DC | PRN

## 2020-12-14 ENCOUNTER — Other Ambulatory Visit: Payer: Self-pay

## 2020-12-14 ENCOUNTER — Ambulatory Visit (INDEPENDENT_AMBULATORY_CARE_PROVIDER_SITE_OTHER): Payer: Medicare Other | Admitting: Cardiovascular Disease

## 2020-12-14 ENCOUNTER — Encounter: Payer: Self-pay | Admitting: Cardiovascular Disease

## 2020-12-14 VITALS — BP 142/60 | HR 62 | Ht 68.0 in | Wt 162.4 lb

## 2020-12-14 DIAGNOSIS — I739 Peripheral vascular disease, unspecified: Secondary | ICD-10-CM

## 2020-12-14 DIAGNOSIS — Z951 Presence of aortocoronary bypass graft: Secondary | ICD-10-CM

## 2020-12-14 DIAGNOSIS — I482 Chronic atrial fibrillation, unspecified: Secondary | ICD-10-CM | POA: Diagnosis not present

## 2020-12-14 DIAGNOSIS — I25708 Atherosclerosis of coronary artery bypass graft(s), unspecified, with other forms of angina pectoris: Secondary | ICD-10-CM | POA: Diagnosis not present

## 2020-12-14 DIAGNOSIS — I5022 Chronic systolic (congestive) heart failure: Secondary | ICD-10-CM | POA: Diagnosis not present

## 2020-12-14 DIAGNOSIS — Z950 Presence of cardiac pacemaker: Secondary | ICD-10-CM

## 2020-12-14 NOTE — Patient Instructions (Signed)
Medication Instructions:  Your physician recommends that you continue on your current medications as directed. Please refer to the Current Medication list given to you today.  *If you need a refill on your cardiac medications before your next appointment, please call your pharmacy*   Lab Work: NONE   If you have labs (blood work) drawn today and your tests are completely normal, you will receive your results only by: [place] (if you have MyChart) OR A paper copy in the mail If you have any lab test that is abnormal or we need to change your treatment, we will call you to review the results.   Testing/Procedures: Your physician has requested that you have an ankle brachial index (ABI). During this test an ultrasound and blood pressure cuff are used to evaluate the arteries that supply the arms and legs with blood. Allow thirty minutes for this exam. There are no restrictions or special instructions.    Follow-Up: At [facility], you and your health needs are our priority.  As part of our continuing mission to provide you with exceptional heart care, we have created designated Provider Care Teams.  These Care Teams include your primary Cardiologist (physician) and Advanced Practice Providers (APPs -  Physician Assistants and Nurse Practitioners) who all work together to provide you with the care you need, when you need it.  We recommend signing up for the patient portal called "MyChart".  Sign up information is provided on this After Visit Summary.  MyChart is used to connect with patients for Virtual Visits (Telemedicine).  Patients are able to view lab/test results, encounter notes, upcoming appointments, etc.  Non-urgent messages can be sent to your provider as well.   To learn more about what you can do with MyChart, go to [email or web address].    Your next appointment:   6 month(s)  The format for your next appointment:   In Person  Provider:   Jenkins Rouge,  MD   Other Instructions Thank you for choosing [facility]!

## 2020-12-17 DIAGNOSIS — L03032 Cellulitis of left toe: Secondary | ICD-10-CM | POA: Diagnosis not present

## 2020-12-18 DIAGNOSIS — Z85828 Personal history of other malignant neoplasm of skin: Secondary | ICD-10-CM | POA: Diagnosis not present

## 2020-12-18 DIAGNOSIS — C44212 Basal cell carcinoma of skin of right ear and external auricular canal: Secondary | ICD-10-CM | POA: Diagnosis not present

## 2020-12-20 ENCOUNTER — Other Ambulatory Visit: Payer: Self-pay | Admitting: Family Medicine

## 2020-12-21 DIAGNOSIS — Z5111 Encounter for antineoplastic chemotherapy: Secondary | ICD-10-CM | POA: Diagnosis not present

## 2020-12-21 DIAGNOSIS — C678 Malignant neoplasm of overlapping sites of bladder: Secondary | ICD-10-CM | POA: Diagnosis not present

## 2020-12-24 ENCOUNTER — Ambulatory Visit (HOSPITAL_COMMUNITY)
Admission: RE | Admit: 2020-12-24 | Discharge: 2020-12-24 | Disposition: A | Discharge status: Home / Self Care | Payer: Medicare Other | Source: Ambulatory Visit | Attending: Cardiovascular Disease | Admitting: Cardiovascular Disease

## 2020-12-24 ENCOUNTER — Other Ambulatory Visit: Payer: Self-pay

## 2020-12-24 DIAGNOSIS — I739 Peripheral vascular disease, unspecified: Secondary | ICD-10-CM | POA: Insufficient documentation

## 2020-12-24 DIAGNOSIS — E119 Type 2 diabetes mellitus without complications: Secondary | ICD-10-CM | POA: Diagnosis not present

## 2020-12-24 DIAGNOSIS — E785 Hyperlipidemia, unspecified: Secondary | ICD-10-CM | POA: Diagnosis not present

## 2020-12-24 DIAGNOSIS — I10 Essential (primary) hypertension: Secondary | ICD-10-CM | POA: Diagnosis not present

## 2020-12-24 DIAGNOSIS — R252 Cramp and spasm: Secondary | ICD-10-CM | POA: Diagnosis not present

## 2020-12-24 DIAGNOSIS — E1151 Type 2 diabetes mellitus with diabetic peripheral angiopathy without gangrene: Secondary | ICD-10-CM | POA: Diagnosis not present

## 2020-12-24 DIAGNOSIS — I70213 Atherosclerosis of native arteries of extremities with intermittent claudication, bilateral legs: Secondary | ICD-10-CM | POA: Diagnosis not present

## 2020-12-26 DIAGNOSIS — C44319 Basal cell carcinoma of skin of other parts of face: Secondary | ICD-10-CM | POA: Diagnosis not present

## 2020-12-26 DIAGNOSIS — D485 Neoplasm of uncertain behavior of skin: Secondary | ICD-10-CM | POA: Diagnosis not present

## 2020-12-28 DIAGNOSIS — Z5111 Encounter for antineoplastic chemotherapy: Secondary | ICD-10-CM | POA: Diagnosis not present

## 2020-12-28 DIAGNOSIS — C678 Malignant neoplasm of overlapping sites of bladder: Secondary | ICD-10-CM | POA: Diagnosis not present

## 2021-01-01 ENCOUNTER — Telehealth: Payer: Self-pay

## 2021-01-01 ENCOUNTER — Inpatient Hospital Stay (HOSPITAL_BASED_OUTPATIENT_CLINIC_OR_DEPARTMENT_OTHER): Payer: Medicare Other | Admitting: Hematology & Oncology

## 2021-01-01 ENCOUNTER — Inpatient Hospital Stay: Payer: Medicare Other | Attending: Hematology & Oncology

## 2021-01-01 ENCOUNTER — Encounter: Payer: Self-pay | Admitting: Hematology & Oncology

## 2021-01-01 ENCOUNTER — Inpatient Hospital Stay: Payer: Medicare Other

## 2021-01-01 ENCOUNTER — Other Ambulatory Visit: Payer: Self-pay

## 2021-01-01 ENCOUNTER — Telehealth: Payer: Self-pay | Admitting: *Deleted

## 2021-01-01 VITALS — BP 143/68 | HR 97 | Temp 98.0°F | Resp 18 | Wt 161.8 lb

## 2021-01-01 DIAGNOSIS — D509 Iron deficiency anemia, unspecified: Secondary | ICD-10-CM | POA: Insufficient documentation

## 2021-01-01 DIAGNOSIS — C679 Malignant neoplasm of bladder, unspecified: Secondary | ICD-10-CM | POA: Insufficient documentation

## 2021-01-01 DIAGNOSIS — C673 Malignant neoplasm of anterior wall of bladder: Secondary | ICD-10-CM

## 2021-01-01 DIAGNOSIS — N189 Chronic kidney disease, unspecified: Secondary | ICD-10-CM | POA: Insufficient documentation

## 2021-01-01 DIAGNOSIS — D631 Anemia in chronic kidney disease: Secondary | ICD-10-CM | POA: Diagnosis not present

## 2021-01-01 DIAGNOSIS — D500 Iron deficiency anemia secondary to blood loss (chronic): Secondary | ICD-10-CM

## 2021-01-01 DIAGNOSIS — I739 Peripheral vascular disease, unspecified: Secondary | ICD-10-CM

## 2021-01-01 LAB — CBC WITH DIFFERENTIAL (CANCER CENTER ONLY)
Abs Immature Granulocytes: 0.05 10*3/uL (ref 0.00–0.07)
Basophils Absolute: 0 10*3/uL (ref 0.0–0.1)
Basophils Relative: 0 %
Eosinophils Absolute: 0.2 10*3/uL (ref 0.0–0.5)
Eosinophils Relative: 3 %
HCT: 34.6 % — ABNORMAL LOW (ref 39.0–52.0)
Hemoglobin: 11.1 g/dL — ABNORMAL LOW (ref 13.0–17.0)
Immature Granulocytes: 1 %
Lymphocytes Relative: 27 %
Lymphs Abs: 1.8 10*3/uL (ref 0.7–4.0)
MCH: 30.1 pg (ref 26.0–34.0)
MCHC: 32.1 g/dL (ref 30.0–36.0)
MCV: 93.8 fL (ref 80.0–100.0)
Monocytes Absolute: 0.7 10*3/uL (ref 0.1–1.0)
Monocytes Relative: 10 %
Neutro Abs: 4 10*3/uL (ref 1.7–7.7)
Neutrophils Relative %: 59 %
Platelet Count: 135 10*3/uL — ABNORMAL LOW (ref 150–400)
RBC: 3.69 MIL/uL — ABNORMAL LOW (ref 4.22–5.81)
RDW: 15.7 % — ABNORMAL HIGH (ref 11.5–15.5)
WBC Count: 6.8 10*3/uL (ref 4.0–10.5)
nRBC: 0 % (ref 0.0–0.2)

## 2021-01-01 LAB — CMP (CANCER CENTER ONLY)
ALT: 13 U/L (ref 0–44)
AST: 23 U/L (ref 15–41)
Albumin: 4.1 g/dL (ref 3.5–5.0)
Alkaline Phosphatase: 79 U/L (ref 38–126)
Anion gap: 8 (ref 5–15)
BUN: 38 mg/dL — ABNORMAL HIGH (ref 8–23)
CO2: 26 mmol/L (ref 22–32)
Calcium: 9.5 mg/dL (ref 8.9–10.3)
Chloride: 107 mmol/L (ref 98–111)
Creatinine: 1.74 mg/dL — ABNORMAL HIGH (ref 0.61–1.24)
GFR, Estimated: 37 mL/min — ABNORMAL LOW (ref >60)
Glucose, Bld: 134 mg/dL — ABNORMAL HIGH (ref 70–99)
Potassium: 5.1 mmol/L (ref 3.5–5.1)
Sodium: 141 mmol/L (ref 135–145)
Total Bilirubin: 1 mg/dL (ref 0.3–1.2)
Total Protein: 7.1 g/dL (ref 6.5–8.1)

## 2021-01-01 LAB — RETICULOCYTES
Immature Retic Fract: 8.8 % (ref 2.3–15.9)
RBC.: 3.65 MIL/uL — ABNORMAL LOW (ref 4.22–5.81)
Retic Count, Absolute: 56.9 10*3/uL (ref 19.0–186.0)
Retic Ct Pct: 1.6 % (ref 0.4–3.1)

## 2021-01-01 NOTE — Telephone Encounter (Signed)
-----   Message from [name], MD sent at 12/29/2020  2:31 PM EDT ----- Circulation ok should have f/u with [facility]

## 2021-01-01 NOTE — Progress Notes (Signed)
Hematology and Oncology Follow Up Visit  [name] [id number] April 12, 1929 87 y.o. 01/01/2021   Principle Diagnosis:  Erythropoietin deficiency anemia Iron deficiency anemia  Current Therapy:   IV iron as indicated -- feraheme given on 07/27/2019 Aranesp 300 g sq prn hemoglobin < 11 - last dose given on  08/10/2020   Interim History:  [name] is here today for follow-up.  He is doing pretty well.  He did have arterial studies done of his legs last week.  He does have atherosclerotic plaques in his lower extremities.  He does have intact pulses and circulation.  His last iron studies back in early August showed a ferritin of 477 with an iron saturation of 22%.  He has had no issues with respect to the bladder cancer.  He has superficial bladder cancer.  I think he has 1 final dose of intravesicular therapy.  Of note, back in July he had a beta natruretic peptide of 1400.  Apparently his dose of Lasix was increased.  He has had no fever.  There has been no cough.  Is a little bit of shortness of breath.  His appetite has been doing pretty well.  Overall, his performance status is ECOG 2.    Medications:  Allergies as of 01/01/2021       Reactions   Penicillins Hives   Has patient had a PCN reaction causing immediate rash, facial/tongue/throat swelling, SOB or lightheadedness with hypotension: No Has patient had a PCN reaction causing severe rash involving mucus membranes or skin necrosis: Yes Has patient had a PCN reaction that required hospitalization: No Has patient had a PCN reaction occurring within the last 10 years: No If all of the above answers are "NO", then may proceed with Cephalosporin use.   Vancomycin Other (See Comments)   Edema and myalgia        Medication List        Accurate as of January 01, 2021  1:17 PM. If you have any questions, ask your nurse or doctor.          Accu-Chek FastClix Lancets Misc Check fasting blood sugar once  daily   Accu-Chek Guide test strip Generic drug: glucose blood Check fasting blood sugar once daily   allopurinol 300 MG tablet Commonly known as: ZYLOPRIM Take 0.5 tablets (150 mg total) by mouth daily.   amLODipine 5 MG tablet Commonly known as: NORVASC Take 1 tablet (5 mg total) by mouth daily.   atorvastatin 20 MG tablet Commonly known as: LIPITOR TAKE 1 TABLET AT BEDTIME   blood glucose meter kit and supplies Dispense based on patient and insurance preference. Use up to four times daily as directed. R73.03   carvedilol 6.25 MG tablet Commonly known as: COREG Take 1 tablet (6.25 mg total) by mouth 2 (two) times daily with a meal.   Cholecalciferol 125 MCG (5000 UT) capsule Take 5,000 Units by mouth daily.   CHROMIUM GTF PO Take 1 capsule by mouth every evening.   Cinnamon 500 MG Tabs Take 2 tablets by mouth at bedtime.   dicyclomine 10 MG capsule Commonly known as: BENTYL TAKE 1 CAPSULE (10 MG TOTAL) BY MOUTH 4 (FOUR) TIMES DAILY - BEFORE MEALS AND AT BEDTIME.   diphenoxylate-atropine 2.5-0.025 MG tablet Commonly known as: LOMOTIL 1 tablet. Pt takes PRN   Eliquis 2.5 MG Tabs tablet Generic drug: apixaban TAKE 1 TABLET TWICE A DAY   finasteride 5 MG tablet Commonly known as: PROSCAR Take 5 mg by mouth  daily.   fish oil-omega-3 fatty acids 1000 MG capsule Take 1 g by mouth daily.   furosemide 20 MG tablet Commonly known as: LASIX Take 40 mg by mouth daily.   latanoprost 0.005 % ophthalmic solution Commonly known as: XALATAN Place 1 drop into both eyes at bedtime.   potassium chloride 10 MEQ tablet Commonly known as: KLOR-CON Take 1 tablet (10 mEq total) by mouth daily.   PRESERVISION AREDS PO Take 1 tablet by mouth 2 (two) times daily.   PROBIOTIC PO Take 1 tablet by mouth daily.   tamsulosin 0.4 MG Caps capsule Commonly known as: FLOMAX Take 0.4 mg by mouth.   timolol 0.5 % ophthalmic solution Commonly known as: TIMOPTIC Place 1 drop  into both eyes 2 (two) times daily.   UNABLE TO FIND Med Name: Mito Q   VANADYL SULFATE PO Take 1 capsule by mouth every evening.   zinc gluconate 50 MG tablet Take 50 mg by mouth daily.        Allergies:  Allergies  Allergen Reactions   Penicillins Hives    Has patient had a PCN reaction causing immediate rash, facial/tongue/throat swelling, SOB or lightheadedness with hypotension: No Has patient had a PCN reaction causing severe rash involving mucus membranes or skin necrosis: Yes Has patient had a PCN reaction that required hospitalization: No Has patient had a PCN reaction occurring within the last 10 years: No If all of the above answers are "NO", then may proceed with Cephalosporin use.    Vancomycin Other (See Comments)    Edema and myalgia    Past Medical History, Surgical history, Social history, and Family History were reviewed and updated.  Review of Systems: Review of Systems  Constitutional: Negative.   HENT: Negative.    Eyes: Negative.   Respiratory: Negative.    Cardiovascular:  Positive for palpitations.  Gastrointestinal: Negative.   Genitourinary: Negative.   Musculoskeletal: Negative.   Skin: Negative.   Neurological: Negative.   Endo/Heme/Allergies: Negative.   Psychiatric/Behavioral: Negative.      Physical Exam:  weight is 161 lb 12 oz (73.4 kg). His oral temperature is 98 F (36.7 C). His blood pressure is 143/68 (abnormal) and his pulse is 97. His respiration is 18 and oxygen saturation is 98%.   Wt Readings from Last 3 Encounters:  01/01/21 161 lb 12 oz (73.4 kg)  12/14/20 162 lb 6.4 oz (73.7 kg)  12/06/20 159 lb (72.1 kg)    Physical Exam Vitals reviewed.  HENT:     Head: Normocephalic and atraumatic.  Eyes:     Pupils: Pupils are equal, round, and reactive to light.  Cardiovascular:     Rate and Rhythm: Normal rate and regular rhythm.     Heart sounds: Normal heart sounds.  Pulmonary:     Effort: Pulmonary effort is  normal.     Breath sounds: Normal breath sounds.  Abdominal:     General: Bowel sounds are normal.     Palpations: Abdomen is soft.  Musculoskeletal:        General: No tenderness or deformity. Normal range of motion.     Cervical back: Normal range of motion.  Lymphadenopathy:     Cervical: No cervical adenopathy.  Skin:    General: Skin is warm and dry.     Findings: No erythema or rash.  Neurological:     Mental Status: He is alert and oriented to person, place, and time.  Psychiatric:        Behavior:  Behavior normal.        Thought Content: Thought content normal.        Judgment: Judgment normal.     Lab Results  Component Value Date   WBC 6.8 01/01/2021   HGB 11.1 (L) 01/01/2021   HCT 34.6 (L) 01/01/2021   MCV 93.8 01/01/2021   PLT 135 (L) 01/01/2021   Lab Results  Component Value Date   FERRITIN 477 (H) 12/06/2020   IRON 60 12/06/2020   TIBC 271 12/06/2020   UIBC 211 12/06/2020   IRONPCTSAT 22 12/06/2020   Lab Results  Component Value Date   RETICCTPCT 1.6 01/01/2021   RBC 3.65 (L) 01/01/2021   No results found for: KPAFRELGTCHN, LAMBDASER, KAPLAMBRATIO No results found for: Kandis Cocking, IGMSERUM Lab Results  Component Value Date   TOTALPROTELP 6.0 (L) 12/26/2015   ALBUMINELP 3.4 (L) 12/26/2015   A1GS 0.4 (H) 12/26/2015   A2GS 0.8 12/26/2015   BETS 0.4 12/26/2015   BETA2SER 0.3 12/26/2015   GAMS 0.8 12/26/2015   MSPIKE Not Observed 01/29/2016   SPEI SEE NOTE 12/26/2015     Chemistry      Component Value Date/Time   NA 141 01/01/2021 1201   NA 141 08/11/2017 0924   NA 145 04/09/2017 0905   NA 140 03/28/2016 0853   K 5.1 01/01/2021 1201   K 4.5 04/09/2017 0905   K 4.4 03/28/2016 0853   CL 107 01/01/2021 1201   CL 105 04/09/2017 0905   CO2 26 01/01/2021 1201   CO2 27 04/09/2017 0905   CO2 19 (L) 03/28/2016 0853   BUN 38 (H) 01/01/2021 1201   BUN 27 08/11/2017 0924   BUN 33 (H) 04/09/2017 0905   BUN 34.4 (H) 03/28/2016 0853    CREATININE 1.74 (H) 01/01/2021 1201   CREATININE 1.72 (H) 08/18/2017 1543   CREATININE 1.7 (H) 03/28/2016 0853   GLU 113 07/24/2016 0000      Component Value Date/Time   CALCIUM 9.5 01/01/2021 1201   CALCIUM 9.2 04/09/2017 0905   CALCIUM 9.2 03/28/2016 0853   ALKPHOS 79 01/01/2021 1201   ALKPHOS 69 04/09/2017 0905   ALKPHOS 85 03/28/2016 0853   AST 23 01/01/2021 1201   AST 23 03/28/2016 0853   ALT 13 01/01/2021 1201   ALT 27 04/09/2017 0905   ALT 37 03/28/2016 0853   BILITOT 1.0 01/01/2021 1201   BILITOT 0.79 03/28/2016 0853      Impression and Plan: [name] is a very pleasant 87 yo caucasian gentleman with both erythropoietin deficiency and iron deficiency anemia.    He does not need any Aranesp today.  I will see any problems with respect to working on his cardiovascular system.  Again he has decent pulses in his distal extremities.  I am not sure he would need any kind of stents or anything.  We will plan to get him back in 6 weeks now.  By then, he may have had work done on his legs.  [name], MD 9/6/20221:17 PM

## 2021-01-01 NOTE — Telephone Encounter (Signed)
Appt s made and printed for pt per 01/01/21 los  [name] 

## 2021-01-01 NOTE — Telephone Encounter (Signed)
Pt notified and order placed to [facility].

## 2021-01-02 LAB — IRON AND TIBC
Iron: 66 ug/dL (ref 42–163)
Saturation Ratios: 24 % (ref 20–55)
TIBC: 273 ug/dL (ref 202–409)
UIBC: 206 ug/dL (ref 117–376)

## 2021-01-02 LAB — FERRITIN: Ferritin: 288 ng/mL (ref 24–336)

## 2021-01-03 DIAGNOSIS — M71572 Other bursitis, not elsewhere classified, left ankle and foot: Secondary | ICD-10-CM | POA: Diagnosis not present

## 2021-01-04 DIAGNOSIS — Z5111 Encounter for antineoplastic chemotherapy: Secondary | ICD-10-CM | POA: Diagnosis not present

## 2021-01-04 DIAGNOSIS — C678 Malignant neoplasm of overlapping sites of bladder: Secondary | ICD-10-CM | POA: Diagnosis not present

## 2021-01-08 DIAGNOSIS — C44329 Squamous cell carcinoma of skin of other parts of face: Secondary | ICD-10-CM | POA: Diagnosis not present

## 2021-01-08 DIAGNOSIS — Z85828 Personal history of other malignant neoplasm of skin: Secondary | ICD-10-CM | POA: Diagnosis not present

## 2021-01-09 DIAGNOSIS — S51802A Unspecified open wound of left forearm, initial encounter: Secondary | ICD-10-CM | POA: Diagnosis not present

## 2021-01-18 ENCOUNTER — Telehealth: Payer: Self-pay | Admitting: Family Medicine

## 2021-01-18 NOTE — Telephone Encounter (Signed)
Pt called and said his furosemide was upped to 40mg  and that he needs a refill.

## 2021-01-18 NOTE — Telephone Encounter (Signed)
Attempted to contact pt in regards to refill request, unable to LVM on both numbers in chart.  Note: We have that he was rx'd 40 mg of med. Pt needs appt for further refills.

## 2021-01-21 MED ORDER — FUROSEMIDE 20 MG PO TABS
40.0000 mg | ORAL_TABLET | Freq: Every day | ORAL | 0 refills | Status: DC
Start: 2021-01-21 — End: 2021-02-11

## 2021-01-21 NOTE — Telephone Encounter (Signed)
Rx sent. Pt sched for [facility].  

## 2021-01-29 ENCOUNTER — Ambulatory Visit (INDEPENDENT_AMBULATORY_CARE_PROVIDER_SITE_OTHER): Payer: Medicare Other

## 2021-01-29 DIAGNOSIS — I441 Atrioventricular block, second degree: Secondary | ICD-10-CM

## 2021-01-29 LAB — CUP PACEART REMOTE DEVICE CHECK
Battery Remaining Longevity: 76 mo
Battery Remaining Percentage: 65 %
Battery Voltage: 2.99 V
Brady Statistic RV Percent Paced: 98 %
Date Time Interrogation Session: 20221004020034
Implantable Lead Implant Date: 20190402
Implantable Lead Implant Date: 20190402
Implantable Lead Location: 753859
Implantable Lead Location: 753860
Implantable Pulse Generator Implant Date: 20190402
Lead Channel Impedance Value: 450 Ohm
Lead Channel Pacing Threshold Amplitude: 0.75 V
Lead Channel Pacing Threshold Pulse Width: 0.5 ms
Lead Channel Sensing Intrinsic Amplitude: 12 mV
Lead Channel Setting Pacing Amplitude: 2.5 V
Lead Channel Setting Pacing Pulse Width: 0.5 ms
Lead Channel Setting Sensing Sensitivity: 2 mV
Pulse Gen Model: 2272
Pulse Gen Serial Number: 9003454

## 2021-02-05 ENCOUNTER — Other Ambulatory Visit: Payer: Self-pay

## 2021-02-05 ENCOUNTER — Inpatient Hospital Stay: Payer: Medicare Other

## 2021-02-05 ENCOUNTER — Inpatient Hospital Stay: Payer: Medicare Other | Attending: Hematology & Oncology

## 2021-02-05 ENCOUNTER — Telehealth: Payer: Self-pay | Admitting: *Deleted

## 2021-02-05 ENCOUNTER — Inpatient Hospital Stay (HOSPITAL_BASED_OUTPATIENT_CLINIC_OR_DEPARTMENT_OTHER): Payer: Medicare Other | Admitting: Hematology & Oncology

## 2021-02-05 ENCOUNTER — Encounter: Payer: Self-pay | Admitting: Hematology & Oncology

## 2021-02-05 VITALS — BP 160/57 | HR 81 | Temp 97.8°F | Resp 18 | Wt 157.0 lb

## 2021-02-05 DIAGNOSIS — N183 Chronic kidney disease, stage 3 unspecified: Secondary | ICD-10-CM | POA: Insufficient documentation

## 2021-02-05 DIAGNOSIS — C673 Malignant neoplasm of anterior wall of bladder: Secondary | ICD-10-CM | POA: Diagnosis not present

## 2021-02-05 DIAGNOSIS — D508 Other iron deficiency anemias: Secondary | ICD-10-CM

## 2021-02-05 DIAGNOSIS — D631 Anemia in chronic kidney disease: Secondary | ICD-10-CM | POA: Insufficient documentation

## 2021-02-05 DIAGNOSIS — D500 Iron deficiency anemia secondary to blood loss (chronic): Secondary | ICD-10-CM

## 2021-02-05 DIAGNOSIS — N189 Chronic kidney disease, unspecified: Secondary | ICD-10-CM | POA: Diagnosis not present

## 2021-02-05 DIAGNOSIS — D509 Iron deficiency anemia, unspecified: Secondary | ICD-10-CM

## 2021-02-05 LAB — CBC WITH DIFFERENTIAL (CANCER CENTER ONLY)
Abs Immature Granulocytes: 0.03 10*3/uL (ref 0.00–0.07)
Basophils Absolute: 0 10*3/uL (ref 0.0–0.1)
Basophils Relative: 0 %
Eosinophils Absolute: 0.2 10*3/uL (ref 0.0–0.5)
Eosinophils Relative: 3 %
HCT: 32.4 % — ABNORMAL LOW (ref 39.0–52.0)
Hemoglobin: 10.5 g/dL — ABNORMAL LOW (ref 13.0–17.0)
Immature Granulocytes: 1 %
Lymphocytes Relative: 29 %
Lymphs Abs: 1.8 10*3/uL (ref 0.7–4.0)
MCH: 29.5 pg (ref 26.0–34.0)
MCHC: 32.4 g/dL (ref 30.0–36.0)
MCV: 91 fL (ref 80.0–100.0)
Monocytes Absolute: 0.6 10*3/uL (ref 0.1–1.0)
Monocytes Relative: 10 %
Neutro Abs: 3.6 10*3/uL (ref 1.7–7.7)
Neutrophils Relative %: 57 %
Platelet Count: 112 10*3/uL — ABNORMAL LOW (ref 150–400)
RBC: 3.56 MIL/uL — ABNORMAL LOW (ref 4.22–5.81)
RDW: 15.6 % — ABNORMAL HIGH (ref 11.5–15.5)
WBC Count: 6.2 10*3/uL (ref 4.0–10.5)
nRBC: 0 % (ref 0.0–0.2)

## 2021-02-05 LAB — CMP (CANCER CENTER ONLY)
ALT: 21 U/L (ref 0–44)
AST: 25 U/L (ref 15–41)
Albumin: 4.2 g/dL (ref 3.5–5.0)
Alkaline Phosphatase: 69 U/L (ref 38–126)
Anion gap: 7 (ref 5–15)
BUN: 33 mg/dL — ABNORMAL HIGH (ref 8–23)
CO2: 26 mmol/L (ref 22–32)
Calcium: 9.5 mg/dL (ref 8.9–10.3)
Chloride: 107 mmol/L (ref 98–111)
Creatinine: 1.78 mg/dL — ABNORMAL HIGH (ref 0.61–1.24)
GFR, Estimated: 36 mL/min — ABNORMAL LOW (ref >60)
Glucose, Bld: 124 mg/dL — ABNORMAL HIGH (ref 70–99)
Potassium: 4.9 mmol/L (ref 3.5–5.1)
Sodium: 140 mmol/L (ref 135–145)
Total Bilirubin: 0.6 mg/dL (ref 0.3–1.2)
Total Protein: 6.9 g/dL (ref 6.5–8.1)

## 2021-02-05 LAB — RETICULOCYTES
Immature Retic Fract: 10.3 % (ref 2.3–15.9)
RBC.: 3.56 MIL/uL — ABNORMAL LOW (ref 4.22–5.81)
Retic Count, Absolute: 45.2 10*3/uL (ref 19.0–186.0)
Retic Ct Pct: 1.3 % (ref 0.4–3.1)

## 2021-02-05 MED ORDER — DARBEPOETIN ALFA 300 MCG/0.6ML IJ SOSY
300.0000 ug | PREFILLED_SYRINGE | Freq: Once | INTRAMUSCULAR | Status: AC
  Administered 2021-02-05: 300 ug via SUBCUTANEOUS
  Filled 2021-02-05: qty 0.6

## 2021-02-05 NOTE — Progress Notes (Signed)
Hematology and Oncology Follow Up Visit  [name] [id number] 1928-06-04 87 y.o. 02/05/2021   Principle Diagnosis:  Erythropoietin deficiency anemia Iron deficiency anemia  Current Therapy:   IV iron as indicated -- feraheme given on 07/27/2019 Aranesp 300 g sq prn hemoglobin < 11 - last dose given on  02/05/2021   Interim History:  [name] is here today for follow-up.  So far, things seem to be going pretty well.  He has had no problems with his bladder.  He has superficial bladder cancer.  I see him in church every Sunday.  He and his wife go to the same church that we do.  He will get his Aranesp today.  He has had no obvious change in bowel or bladder habits.  He has had no issues with nausea or vomiting.  He has had no problems with cough or shortness of breath.  He has had no issues with headache.  Para he does have some macular degeneration.  Overall, his performance status is ECOG 2.     Medications:  Allergies as of 02/05/2021       Reactions   Penicillins Hives   Has patient had a PCN reaction causing immediate rash, facial/tongue/throat swelling, SOB or lightheadedness with hypotension: No Has patient had a PCN reaction causing severe rash involving mucus membranes or skin necrosis: Yes Has patient had a PCN reaction that required hospitalization: No Has patient had a PCN reaction occurring within the last 10 years: No If all of the above answers are "NO", then may proceed with Cephalosporin use.   Vancomycin Other (See Comments)   Edema and myalgia        Medication List        Accurate as of February 05, 2021 11:42 AM. If you have any questions, ask your nurse or doctor.          Accu-Chek FastClix Lancets Misc Check fasting blood sugar once daily   Accu-Chek Guide test strip Generic drug: glucose blood Check fasting blood sugar once daily   allopurinol 300 MG tablet Commonly known as: ZYLOPRIM Take 0.5 tablets (150 mg total) by  mouth daily.   amLODipine 5 MG tablet Commonly known as: NORVASC Take 1 tablet (5 mg total) by mouth daily.   atorvastatin 20 MG tablet Commonly known as: LIPITOR TAKE 1 TABLET AT BEDTIME   blood glucose meter kit and supplies Dispense based on patient and insurance preference. Use up to four times daily as directed. R73.03   carvedilol 6.25 MG tablet Commonly known as: COREG Take 1 tablet (6.25 mg total) by mouth 2 (two) times daily with a meal.   Cholecalciferol 125 MCG (5000 UT) capsule Take 5,000 Units by mouth daily.   CHROMIUM GTF PO Take 1 capsule by mouth every evening.   Cinnamon 500 MG Tabs Take 2 tablets by mouth at bedtime.   dicyclomine 10 MG capsule Commonly known as: BENTYL TAKE 1 CAPSULE (10 MG TOTAL) BY MOUTH 4 (FOUR) TIMES DAILY - BEFORE MEALS AND AT BEDTIME.   diphenoxylate-atropine 2.5-0.025 MG tablet Commonly known as: LOMOTIL 1 tablet. Pt takes PRN   Eliquis 2.5 MG Tabs tablet Generic drug: apixaban TAKE 1 TABLET TWICE A DAY   finasteride 5 MG tablet Commonly known as: PROSCAR Take 5 mg by mouth daily.   fish oil-omega-3 fatty acids 1000 MG capsule Take 1 g by mouth daily.   furosemide 20 MG tablet Commonly known as: LASIX Take 2 tablets (40 mg total) by  mouth daily.   latanoprost 0.005 % ophthalmic solution Commonly known as: XALATAN Place 1 drop into both eyes at bedtime.   potassium chloride 10 MEQ tablet Commonly known as: KLOR-CON Take 1 tablet (10 mEq total) by mouth daily.   PRESERVISION AREDS PO Take 1 tablet by mouth 2 (two) times daily.   PROBIOTIC PO Take 1 tablet by mouth daily.   tamsulosin 0.4 MG Caps capsule Commonly known as: FLOMAX Take 0.4 mg by mouth.   timolol 0.5 % ophthalmic solution Commonly known as: TIMOPTIC Place 1 drop into both eyes 2 (two) times daily.   UNABLE TO FIND Med Name: Mito Q   VANADYL SULFATE PO Take 1 capsule by mouth every evening.   zinc gluconate 50 MG tablet Take 50 mg by  mouth daily.        Allergies:  Allergies  Allergen Reactions   Penicillins Hives    Has patient had a PCN reaction causing immediate rash, facial/tongue/throat swelling, SOB or lightheadedness with hypotension: No Has patient had a PCN reaction causing severe rash involving mucus membranes or skin necrosis: Yes Has patient had a PCN reaction that required hospitalization: No Has patient had a PCN reaction occurring within the last 10 years: No If all of the above answers are "NO", then may proceed with Cephalosporin use.    Vancomycin Other (See Comments)    Edema and myalgia    Past Medical History, Surgical history, Social history, and Family History were reviewed and updated.  Review of Systems: Review of Systems  Constitutional: Negative.   HENT: Negative.    Eyes: Negative.   Respiratory: Negative.    Cardiovascular:  Positive for palpitations.  Gastrointestinal: Negative.   Genitourinary: Negative.   Musculoskeletal: Negative.   Skin: Negative.   Neurological: Negative.   Endo/Heme/Allergies: Negative.   Psychiatric/Behavioral: Negative.      Physical Exam:  weight is 157 lb (71.2 kg). His oral temperature is 97.8 F (36.6 C). His blood pressure is 160/57 (abnormal) and his pulse is 81. His respiration is 18 and oxygen saturation is 99%.   Wt Readings from Last 3 Encounters:  02/05/21 157 lb (71.2 kg)  01/01/21 161 lb 12 oz (73.4 kg)  12/14/20 162 lb 6.4 oz (73.7 kg)    Physical Exam Vitals reviewed.  HENT:     Head: Normocephalic and atraumatic.  Eyes:     Pupils: Pupils are equal, round, and reactive to light.  Cardiovascular:     Rate and Rhythm: Normal rate and regular rhythm.     Heart sounds: Normal heart sounds.  Pulmonary:     Effort: Pulmonary effort is normal.     Breath sounds: Normal breath sounds.  Abdominal:     General: Bowel sounds are normal.     Palpations: Abdomen is soft.  Musculoskeletal:        General: No tenderness or  deformity. Normal range of motion.     Cervical back: Normal range of motion.  Lymphadenopathy:     Cervical: No cervical adenopathy.  Skin:    General: Skin is warm and dry.     Findings: No erythema or rash.  Neurological:     Mental Status: He is alert and oriented to person, place, and time.  Psychiatric:        Behavior: Behavior normal.        Thought Content: Thought content normal.        Judgment: Judgment normal.     Lab Results  Component  Value Date   WBC 6.2 02/05/2021   HGB 10.5 (L) 02/05/2021   HCT 32.4 (L) 02/05/2021   MCV 91.0 02/05/2021   PLT 112 (L) 02/05/2021   Lab Results  Component Value Date   FERRITIN 288 01/01/2021   IRON 66 01/01/2021   TIBC 273 01/01/2021   UIBC 206 01/01/2021   IRONPCTSAT 24 01/01/2021   Lab Results  Component Value Date   RETICCTPCT 1.3 02/05/2021   RBC 3.56 (L) 02/05/2021   RBC 3.56 (L) 02/05/2021   No results found for: KPAFRELGTCHN, LAMBDASER, KAPLAMBRATIO No results found for: Kandis Cocking, IGMSERUM Lab Results  Component Value Date   TOTALPROTELP 6.0 (L) 12/26/2015   ALBUMINELP 3.4 (L) 12/26/2015   A1GS 0.4 (H) 12/26/2015   A2GS 0.8 12/26/2015   BETS 0.4 12/26/2015   BETA2SER 0.3 12/26/2015   GAMS 0.8 12/26/2015   MSPIKE Not Observed 01/29/2016   SPEI SEE NOTE 12/26/2015     Chemistry      Component Value Date/Time   NA 141 01/01/2021 1201   NA 141 08/11/2017 0924   NA 145 04/09/2017 0905   NA 140 03/28/2016 0853   K 5.1 01/01/2021 1201   K 4.5 04/09/2017 0905   K 4.4 03/28/2016 0853   CL 107 01/01/2021 1201   CL 105 04/09/2017 0905   CO2 26 01/01/2021 1201   CO2 27 04/09/2017 0905   CO2 19 (L) 03/28/2016 0853   BUN 38 (H) 01/01/2021 1201   BUN 27 08/11/2017 0924   BUN 33 (H) 04/09/2017 0905   BUN 34.4 (H) 03/28/2016 0853   CREATININE 1.74 (H) 01/01/2021 1201   CREATININE 1.72 (H) 08/18/2017 1543   CREATININE 1.7 (H) 03/28/2016 0853   GLU 113 07/24/2016 0000      Component Value Date/Time    CALCIUM 9.5 01/01/2021 1201   CALCIUM 9.2 04/09/2017 0905   CALCIUM 9.2 03/28/2016 0853   ALKPHOS 79 01/01/2021 1201   ALKPHOS 69 04/09/2017 0905   ALKPHOS 85 03/28/2016 0853   AST 23 01/01/2021 1201   AST 23 03/28/2016 0853   ALT 13 01/01/2021 1201   ALT 27 04/09/2017 0905   ALT 37 03/28/2016 0853   BILITOT 1.0 01/01/2021 1201   BILITOT 0.79 03/28/2016 0853      Impression and Plan: Mr. Ranieri is a very pleasant 87 yo caucasian gentleman with both erythropoietin deficiency and iron deficiency anemia.    We will going give him Aranesp this morning.  I will plan to get him back right before Thanksgiving.  Hopefully, everything will be looking good.  We will try to get him through the holidays if we can.   [name], MD 10/11/202211:42 AM

## 2021-02-05 NOTE — Patient Instructions (Signed)
Darbepoetin Alfa injection What is this medication? DARBEPOETIN ALFA (dar be POE e tin AL fa) helps your body make more red blood cells. It is used to treat anemia caused by chronic kidney failure and chemotherapy. This medicine may be used for other purposes; ask your health care provider or pharmacist if you have questions. COMMON BRAND NAME(S): Aranesp What should I tell my care team before I take this medication? They need to know if you have any of these conditions: blood clotting disorders or history of blood clots cancer patient not on chemotherapy cystic fibrosis heart disease, such as angina, heart failure, or a history of a heart attack hemoglobin level of 12 g/dL or greater high blood pressure low levels of folate, iron, or vitamin B12 seizures an unusual or allergic reaction to darbepoetin, erythropoietin, albumin, hamster proteins, latex, other medicines, foods, dyes, or preservatives pregnant or trying to get pregnant breast-feeding How should I use this medication? This medicine is for injection into a vein or under the skin. It is usually given by a health care professional in a hospital or clinic setting. If you get this medicine at home, you will be taught how to prepare and give this medicine. Use exactly as directed. Take your medicine at regular intervals. Do not take your medicine more often than directed. It is important that you put your used needles and syringes in a special sharps container. Do not put them in a trash can. If you do not have a sharps container, call your pharmacist or healthcare provider to get one. A special MedGuide will be given to you by the pharmacist with each prescription and refill. Be sure to read this information carefully each time. Talk to your pediatrician regarding the use of this medicine in children. While this medicine may be used in children as young as 1 month of age for selected conditions, precautions do apply. Overdosage: If you  think you have taken too much of this medicine contact a poison control center or emergency room at once. NOTE: This medicine is only for you. Do not share this medicine with others. What if I miss a dose? If you miss a dose, take it as soon as you can. If it is almost time for your next dose, take only that dose. Do not take double or extra doses. What may interact with this medication? Do not take this medicine with any of the following medications: epoetin alfa This list may not describe all possible interactions. Give your health care provider a list of all the medicines, herbs, non-prescription drugs, or dietary supplements you use. Also tell them if you smoke, drink alcohol, or use illegal drugs. Some items may interact with your medicine. What should I watch for while using this medication? Your condition will be monitored carefully while you are receiving this medicine. You may need blood work done while you are taking this medicine. This medicine may cause a decrease in vitamin B6. You should make sure that you get enough vitamin B6 while you are taking this medicine. Discuss the foods you eat and the vitamins you take with your health care professional. What side effects may I notice from receiving this medication? Side effects that you should report to your doctor or health care professional as soon as possible: allergic reactions like skin rash, itching or hives, swelling of the face, lips, or tongue breathing problems changes in vision chest pain confusion, trouble speaking or understanding feeling faint or lightheaded, falls high blood pressure   muscle aches or pains pain, swelling, warmth in the leg rapid weight gain severe headaches sudden numbness or weakness of the face, arm or leg trouble walking, dizziness, loss of balance or coordination seizures (convulsions) swelling of the ankles, feet, hands unusually weak or tired Side effects that usually do not require medical  attention (report to your doctor or health care professional if they continue or are bothersome): diarrhea fever, chills (flu-like symptoms) headaches nausea, vomiting redness, stinging, or swelling at site where injected This list may not describe all possible side effects. Call your doctor for medical advice about side effects. You may report side effects to FDA at 1-800-FDA-1088. Where should I keep my medication? Keep out of the reach of children. Store in a refrigerator between 2 and 8 degrees C (36 and 46 degrees F). Do not freeze. Do not shake. Throw away any unused portion if using a single-dose vial. Throw away any unused medicine after the expiration date. NOTE: This sheet is a summary. It may not cover all possible information. If you have questions about this medicine, talk to your doctor, pharmacist, or health care provider.  2022 Elsevier/Gold Standard (2017-04-29 16:44:20)  

## 2021-02-05 NOTE — Telephone Encounter (Signed)
Per 02/05/21 los - gave upcoming appointments - confirmed 

## 2021-02-06 LAB — IRON AND TIBC
Iron: 71 ug/dL (ref 45–182)
Saturation Ratios: 23 % (ref 17.9–39.5)
TIBC: 308 ug/dL (ref 250–450)
UIBC: 237 ug/dL

## 2021-02-06 LAB — FERRITIN: Ferritin: 232 ng/mL (ref 24–336)

## 2021-02-06 NOTE — Progress Notes (Signed)
Remote pacemaker transmission.   

## 2021-02-08 DIAGNOSIS — H353134 Nonexudative age-related macular degeneration, bilateral, advanced atrophic with subfoveal involvement: Secondary | ICD-10-CM | POA: Diagnosis not present

## 2021-02-08 DIAGNOSIS — H401134 Primary open-angle glaucoma, bilateral, indeterminate stage: Secondary | ICD-10-CM | POA: Diagnosis not present

## 2021-02-10 NOTE — Progress Notes (Signed)
[facility] OF   ASSESSMENT / PLAN: [name] is a 87 y.o. male with symptomatic swelling of bilateral lower extremities, right greater than left.  He had a remote right greater saphenous vein harvest for coronary artery bypass grafting in the 1999.  Edema is likely multifactorial (heart failure, CKD, chronic venous insufficiency).  I reassured the patient that the edema is not limb threatening.  He should do conservative measures including elevation and compression.  He does have some evidence of peripheral arterial disease on noninvasive testing, but this appears asymptomatic, and I would only recommend medical therapy (detailed below).  He is deconditioned.  I will refer him to physical therapy / cardiac rehab.  He can follow-up with me as needed.  Recommend the following which can slow the progression of atherosclerosis and reduce the risk of major adverse cardiac / limb events:  Complete cessation from all tobacco products. Blood glucose control with goal A1c < 7%. Blood pressure control with goal blood pressure < 140/90 mmHg. Lipid reduction therapy with goal LDL-C <100 mg/dL (<70 if symptomatic from PAD).  Aspirin 19m PO QD.  Atorvastatin 40-80mg PO QD (or other "high intensity" statin therapy). Daily walking to and past the point of discomfort. Patient counseled to keep a log of exercise distance.  CHIEF COMPLAINT: leg swelling  HISTORY OF PRESENT ILLNESS: William MCGARVEYis a 87y.o. male referred to clinic for evaluation of right leg pain and swelling.  The patient reports he is noted increased swelling in the right greater than left legs over the past several months.  This is bothersome to him.  The legs feel heavy and tired.  He does not endorse symptoms classic for intermittent claudication.  He denies symptoms typical of ischemic rest pain.  He has no ulcers about his feet.  He is mostly concerned about the swelling today in my office.  He reports  he was previously quite active and has seen a significant decline in his exercise tolerance.  Past Medical History:  Diagnosis Date   A-fib ([facility]    Basal cell carcinoma    skin   Bigeminal rhythm    Bradycardia 06/21/2017   [facility] of anterior wall of urinary bladder [facility] 07[facility]   Cataract    Chronic renal insufficiency, stage 3 (moderate) ([facility]   GFR 30s-40s   Coronary artery disease    post bypass   CVD (cardiovascular disease)    Diabetes mellitus without complication ([facility]    type 2 diet controlled   Diverticular disease    Easy bruising    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   Gout    Hematuria 06/30/2019   Hernia    Hyperkalemia 05/06/2017   Hyperlipidemia    Hypertension    IBS (irritable bowel syndrome)    Iron deficiency anemia 02/04/2016   Macular degeneration    Microscopic colitis    PVD (peripheral vascular disease) ([facility]     Past Surgical History:  Procedure Laterality Date   CARDIAC CATHETERIZATION  2006   CARDIOVERSION N/A 02/22/2013   Procedure: CARDIOVERSION;  Surgeon: PJosue Hector MD;  [place] [facility]  Service: Cardiovascular;  Laterality: N/A;   CARDIOVERSION N/A 11/15/2014   Procedure: CARDIOVERSION;  Surgeon: PJosue Hector MD;  [place] [facility]  Service: Cardiovascular;  Laterality: N/A;   CARDIOVERSION N/A 04/01/2017   Procedure: CARDIOVERSION;  Surgeon: MLarey Dresser MD;  [place] [facility]  Service: Cardiovascular;  Laterality: N/A;   CAROTID ENDARTERECTOMY  2009/ 1993   left/ right   COLONOSCOPY  03/17/2005   The colon is normal.   CORONARY ARTERY BYPASS GRAFT  1999   CYSTOSCOPY W/ RETROGRADES Bilateral 07/14/2019   Procedure: CYSTOSCOPY WITH RETROGRADE PYELOGRAM;  Surgeon: Franchot Gallo, MD;  Location: [name];  Service: Urology;  Laterality: Bilateral;   ESOPHAGOGASTRODUODENOSCOPY  03/17/2005   Normal esophagus. Normal Stomanch. Normal duodenum.    EYE SURGERY     eyelid repair    INGUINAL HERNIA REPAIR  02/11/2012   Procedure: LAPAROSCOPIC BILATERAL INGUINAL HERNIA REPAIR;  Surgeon: Pedro Earls, MD;  Location: [facility];  Service: General;  Laterality: Bilateral;   PACEMAKER IMPLANT N/A 07/28/2017   [employer] Medical Assurity MRI conditional  dual-chamber pacemaker for symptomatic second degree AV block by Dr Rayann Heman   SKIN CANCER EXCISION     right ear x 3   TRANSURETHRAL RESECTION OF BLADDER TUMOR N/A 09/05/2019   Procedure: TRANSURETHRAL RESECTION OF BLADDER TUMOR (TURBT);  Surgeon: Franchot Gallo, MD;  Location[facility];  Service: Urology;  Laterality: N/A;   TRANSURETHRAL RESECTION OF BLADDER TUMOR WITH MITOMYCIN-C N/A 07/14/2019   Procedure: TRANSURETHRAL RESECTION OF BLADDER TUMOR WITH GEMCITABINE IN PACU;  Surgeon: Franchot Gallo, MD;  Location: [facility];  Service: Urology;  Laterality: N/A;    Family History  Problem Relation Age of Onset   Stroke Mother    Coronary artery disease Father    Heart disease Father    Melanoma Sister    Colon cancer Neg Hx     Social History   Socioeconomic History   Marital status: Married    Spouse name: Not on file   Number of children: Not on file   Years of education: Not on file   Highest education level: Not on file  Occupational History   Occupation: retired  Tobacco Use   Smoking status: Former   Smokeless tobacco: Former    Types: Chew    Quit date: 02/03/1969  Vaping Use   Vaping Use: Never used  Substance and Sexual Activity   Alcohol use: No    Alcohol/week: 0.0 standard drinks   Drug use: No   Sexual activity: Never  Other Topics Concern   Not on file  Social History Narrative   Married to [place], 2 children [name] and [name].   Some college. Retired from [employer].   Drinks caffeine, uses herbal remedies, takes a daily vitamin.   Wears his seatbelt, smoke detector at home, firearms in the home.   Wears a hearing aid.   Feels safe in her  relationships.   Social Determinants of Health   Financial Resource Strain: Not on file  Food Insecurity: Not on file  Transportation Needs: Not on file  Physical Activity: Not on file  Stress: Not on file  Social Connections: Not on file  Intimate Partner Violence: Not on file    Allergies  Allergen Reactions   Penicillins Hives    Has patient had a PCN reaction causing immediate rash, facial/tongue/throat swelling, SOB or lightheadedness with hypotension: No Has patient had a PCN reaction causing severe rash involving mucus membranes or skin necrosis: Yes Has patient had a PCN reaction that required hospitalization: No Has patient had a PCN reaction occurring within the last 10 years: No If all of the above answers are "NO", then may proceed with Cephalosporin use.    Vancomycin Other (See Comments)    Edema and myalgia    Current  Outpatient Medications  Medication Sig Dispense Refill   ACCU-CHEK FASTCLIX LANCETS MISC Check fasting blood sugar once daily 100 each 3   allopurinol (ZYLOPRIM) 300 MG tablet Take 0.5 tablets (150 mg total) by mouth daily. 45 tablet 3   amLODipine (NORVASC) 5 MG tablet Take 1 tablet (5 mg total) by mouth daily. 90 tablet 1   atorvastatin (LIPITOR) 20 MG tablet Take 1 tablet (20 mg total) by mouth at bedtime. 90 tablet 3   blood glucose meter kit and supplies Dispense based on patient and insurance preference. Use up to four times daily as directed. R73.03 1 each 0   carvedilol (COREG) 6.25 MG tablet Take 1 tablet (6.25 mg total) by mouth 2 (two) times daily with a meal. 180 tablet 3   Cholecalciferol 125 MCG (5000 UT) capsule Take 5,000 Units by mouth daily.     CHROMIUM GTF PO Take 1 capsule by mouth every evening.      Cinnamon 500 MG TABS Take 2 tablets by mouth at bedtime.      dicyclomine (BENTYL) 10 MG capsule Take 1 capsule (10 mg total) by mouth 4 (four) times daily -  before meals and at bedtime. 360 capsule 5   diphenoxylate-atropine  (LOMOTIL) 2.5-0.025 MG tablet 1 tablet. Pt takes PRN     ELIQUIS 2.5 MG TABS tablet TAKE 1 TABLET TWICE A DAY 180 tablet 1   finasteride (PROSCAR) 5 MG tablet Take 5 mg by mouth daily.  3   fish oil-omega-3 fatty acids 1000 MG capsule Take 1 g by mouth daily.      furosemide (LASIX) 40 MG tablet Take 1 tablet (40 mg total) by mouth daily. 90 tablet 1   glucose blood (ACCU-CHEK GUIDE) test strip Check fasting blood sugar once daily 100 each 3   latanoprost (XALATAN) 0.005 % ophthalmic solution Place 1 drop into both eyes at bedtime.     Multiple Vitamins-Minerals (PRESERVISION AREDS PO) Take 1 tablet by mouth 2 (two) times daily.      potassium chloride (KLOR-CON) 10 MEQ tablet Take 1 tablet (10 mEq total) by mouth daily. 90 tablet 3   Probiotic Product (PROBIOTIC PO) Take 1 tablet by mouth daily.     tamsulosin (FLOMAX) 0.4 MG CAPS capsule Take 0.4 mg by mouth.     timolol (TIMOPTIC) 0.5 % ophthalmic solution Place 1 drop into both eyes 2 (two) times daily.      UNABLE TO FIND Med Name: Mito Q     VANADYL SULFATE PO Take 1 capsule by mouth every evening.      zinc gluconate 50 MG tablet Take 50 mg by mouth daily.      No current facility-administered medications for this visit.   Facility-Administered Medications Ordered in Other Visits  Medication Dose Route Frequency Provider Last Rate Last Admin   gemcitabine (GEMZAR) chemo syringe for bladder instillation 2,000 mg  2,000 mg Bladder Instillation Once Franchot Gallo, MD        REVIEW OF SYSTEMS:  '[X]'  denotes positive finding, '[ ]'  denotes negative finding Cardiac  Comments:  Chest pain or chest pressure:    Shortness of breath upon exertion:    Short of breath when lying flat:    Irregular heart rhythm: x       Vascular    Pain in calf, thigh, or hip brought on by ambulation:    Pain in feet at night that wakes you up from your sleep:  x   Blood clot in your  veins:    Leg swelling:  x       Pulmonary    Oxygen at home:     Productive cough:     Wheezing:         Neurologic    Sudden weakness in arms or legs:  x   Sudden numbness in arms or legs:     Sudden onset of difficulty speaking or slurred speech:    Temporary loss of vision in one eye:     Problems with dizziness:         Gastrointestinal    Blood in stool:     Vomited blood:         Genitourinary    Burning when urinating:     Blood in urine:        Psychiatric    Major depression:         Hematologic    Bleeding problems:    Problems with blood clotting too easily:        Skin    Rashes or ulcers:        Constitutional    Fever or chills:      PHYSICAL EXAM Vitals:   02/12/21 0903  BP: (!) 161/58  Pulse: 65  Resp: 20  Temp: 98.2 F (36.8 C)  SpO2: 99%  Weight: 154 lb (69.9 kg)  Height: '5\' 8"'  (1.727 m)    Constitutional: Elderly, but spry appearing. No distress. Appears well nourished.  Neurologic: CN intact. no focal findings. no sensory loss. Psychiatric:  Mood and affect symmetric and appropriate. Eyes:  No icterus. No conjunctival pallor. Ears, nose, throat:  mucous membranes moist. Midline trachea.  Cardiac: regular rate and rhythm.  Respiratory:  unlabored. Abdominal:  soft, non-tender, non-distended.  Peripheral vascular: No palpable pedal pulses.  Scarring consistent with right lower extremity saphenous vein harvest. Extremity: 2+ right lower extremity pitting edema to the knee.  1+ left lower extremity pitting edema to the knee. No cyanosis. No pallor.  Skin: no gangrene. no ulceration.  Lymphatic: no Stemmer's sign. no palpable lymphadenopathy.  PERTINENT LABORATORY AND RADIOLOGIC DATA  Most recent CBC CBC Latest Ref Rng & Units 02/05/2021 01/01/2021 12/06/2020  WBC 4.0 - 10.5 K/uL 6.2 6.8 6.3  Hemoglobin 13.0 - 17.0 g/dL 10.5(L) 11.1(L) 9.9(L)  Hematocrit 39.0 - 52.0 % 32.4(L) 34.6(L) 30.5(L)  Platelets 150 - 400 K/uL 112(L) 135(L) 129(L)     Most recent CMP CMP Latest Ref Rng & Units 02/11/2021  02/05/2021 01/01/2021  Glucose 70 - 99 mg/dL - 124(H) 134(H)  BUN 8 - 23 mg/dL - 33(H) 38(H)  Creatinine 0.61 - 1.24 mg/dL - 1.78(H) 1.74(H)  Sodium 135 - 145 mmol/L - 140 141  Potassium 3.5 - 5.1 mmol/L - 4.9 5.1  Chloride 98 - 111 mmol/L - 107 107  CO2 22 - 32 mmol/L - 26 26  Calcium 8.6 - 10.3 mg/dL 8.8 9.5 9.5  Total Protein 6.5 - 8.1 g/dL - 6.9 7.1  Total Bilirubin 0.3 - 1.2 mg/dL - 0.6 1.0  Alkaline Phos 38 - 126 U/L - 69 79  AST 15 - 41 U/L - 25 23  ALT 0 - 44 U/L - 21 13    Renal function Estimated Creatinine Clearance: 26.7 mL/min (A) (by C-G formula based on SCr of 1.78 mg/dL (H)).  HbA1c, POC (prediabetic range) (%)  Date Value  06/28/2019 6.5 (A)   HbA1c, POC (controlled diabetic range) (%)  Date Value  06/28/2019 6.5   HbA1c POC (<>  result, manual entry) (%)  Date Value  06/28/2019 6.5   Hgb A1c MFr Bld (% of total Hgb)  Date Value  02/11/2021 6.0 (H)    LDL Cholesterol (Calc)  Date Value Ref Range Status  11/24/2019 54 mg/dL (calc) Final    Comment:    Reference range: <100 . Desirable range <100 mg/dL for primary prevention;   <70 mg/dL for patients with CHD or diabetic patients  with > or = 2 CHD risk factors. Marland Kitchen LDL-C is now calculated using the Martin-Hopkins  calculation, which is a validated novel method providing  better accuracy than the Friedewald equation in the  estimation of LDL-C.  Cresenciano Genre et al. Annamaria Helling. 6759;163(84): 2061-2068  (http://education.[facility].com/faq/FAQ164)    Direct LDL  Date Value Ref Range Status  02/11/2021 49 <100 mg/dL Final    Comment:    Greatly elevated Triglycerides values (>1200 mg/dL) interfere with the dLDL assay. As no Triglycerides  testing was ordered, interpret results with caution. . Desirable range <100 mg/dL for primary prevention;   <70 mg/dL for patients with CHD or diabetic patients  with > or = 2 CHD risk factors. Marland Kitchen      CLINICAL DATA:  Right leg cramping after walking 100  feet.   Former smoker   Hypertension   Diabetes   EXAM: NONINVASIVE PHYSIOLOGIC VASCULAR STUDY OF BILATERAL LOWER EXTREMITIES   TECHNIQUE: Evaluation of both lower extremities were performed at rest, including calculation of ankle-brachial indices with single level Doppler, pressure and pulse volume recording.   COMPARISON:  None.   FINDINGS: Right ABI:  1.01   Left ABI:  1.49   Right Lower Extremity: Triphasic waveform seen in the posterior tibial artery. Monophasic waveform present in the dorsalis pedis artery.   Left Lower Extremity: Triphasic waveform seen in the dural Cialis pedis artery. Posterior tibial artery is monophasic.   > 1.4 Non diagnostic secondary to incompressible vessel calcifications (medial arterial sclerosis of [place])   IMPRESSION: Limited study given noncompressible right posterior tibial and left dorsalis pedis arteries, as well as super normal ABI of left posterior tibial artery. Findings are likely the result of diffusely calcified lower extremity arterial vasculature. Significant stenosis cannot be excluded by this ABI exam. Consider further evaluation with CT angiography of the lower extremities.     Electronically Signed   By: Miachel Roux M.D.   On: 12/24/2020 14:44  CLINICAL DATA:  Claudication   Hypertension   Hyperlipidemia   CAD   Prior CABG   Peripheral vascular disease   Diabetes   Former tobacco use   EXAM: BILATERAL LOWER EXTREMITY ARTERIAL DUPLEX SCAN   TECHNIQUE: Gray-scale sonography as well as color Doppler and duplex ultrasound was performed to evaluate the arteries of both lower extremities including the common, superficial and profunda femoral arteries, popliteal artery and calf arteries.   COMPARISON:  None.   FINDINGS: Right Lower Extremity   Inflow: Normal common femoral arterial waveforms and velocities. No evidence of inflow (aortoiliac) disease.   Outflow: Normal profunda femoral,  superficial femoral and popliteal arterial waveforms and velocities. No focal elevation of the PSV to suggest stenosis.   Runoff: Normal posterior and anterior tibial arterial waveforms and velocities. Vessels are patent to the ankle.   Left Lower Extremity   Inflow: Normal common femoral arterial waveforms and velocities. No evidence of inflow (aortoiliac) disease.   Outflow: Normal profunda femoral, superficial femoral and popliteal arterial waveforms and velocities. No focal elevation of the PSV to suggest stenosis.  Runoff: Normal posterior and anterior tibial arterial waveforms and velocities. Vessels are patent to the ankle.   IMPRESSION: 1. Patent bilateral lower extremity arteries. 2. Extensive atherosclerotic plaque seen throughout the lower extremity arteries.     Electronically Signed   By: Miachel Roux M.D.   On: 12/24/2020 15:05  Yevonne Aline. Stanford Breed, MD [facility] Phone Number: [phone] 02/12/2021 12:31 PM  Total time spent on preparing this encounter including chart review, data review, collecting history, examining the patient, coordinating care for this new patient, 60 minutes.  Portions of this report may have been transcribed using voice recognition software.  Every effort has been made to ensure accuracy; however, inadvertent computerized transcription errors may still be present.

## 2021-02-11 ENCOUNTER — Ambulatory Visit (INDEPENDENT_AMBULATORY_CARE_PROVIDER_SITE_OTHER): Payer: Medicare Other | Admitting: Family Medicine

## 2021-02-11 ENCOUNTER — Encounter: Payer: Self-pay | Admitting: Family Medicine

## 2021-02-11 ENCOUNTER — Other Ambulatory Visit: Payer: Self-pay

## 2021-02-11 VITALS — BP 134/84 | HR 60 | Temp 97.6°F | Ht 68.0 in | Wt 154.0 lb

## 2021-02-11 DIAGNOSIS — I5032 Chronic diastolic (congestive) heart failure: Secondary | ICD-10-CM

## 2021-02-11 DIAGNOSIS — C673 Malignant neoplasm of anterior wall of bladder: Secondary | ICD-10-CM

## 2021-02-11 DIAGNOSIS — E7800 Pure hypercholesterolemia, unspecified: Secondary | ICD-10-CM | POA: Diagnosis not present

## 2021-02-11 DIAGNOSIS — R7303 Prediabetes: Secondary | ICD-10-CM

## 2021-02-11 DIAGNOSIS — D631 Anemia in chronic kidney disease: Secondary | ICD-10-CM

## 2021-02-11 DIAGNOSIS — M109 Gout, unspecified: Secondary | ICD-10-CM | POA: Diagnosis not present

## 2021-02-11 DIAGNOSIS — D6869 Other thrombophilia: Secondary | ICD-10-CM

## 2021-02-11 DIAGNOSIS — Z23 Encounter for immunization: Secondary | ICD-10-CM

## 2021-02-11 DIAGNOSIS — K580 Irritable bowel syndrome with diarrhea: Secondary | ICD-10-CM

## 2021-02-11 DIAGNOSIS — E559 Vitamin D deficiency, unspecified: Secondary | ICD-10-CM

## 2021-02-11 DIAGNOSIS — I480 Paroxysmal atrial fibrillation: Secondary | ICD-10-CM

## 2021-02-11 DIAGNOSIS — E7849 Other hyperlipidemia: Secondary | ICD-10-CM

## 2021-02-11 DIAGNOSIS — I25708 Atherosclerosis of coronary artery bypass graft(s), unspecified, with other forms of angina pectoris: Secondary | ICD-10-CM

## 2021-02-11 DIAGNOSIS — N1832 Chronic kidney disease, stage 3b: Secondary | ICD-10-CM | POA: Diagnosis not present

## 2021-02-11 DIAGNOSIS — I10 Essential (primary) hypertension: Secondary | ICD-10-CM

## 2021-02-11 DIAGNOSIS — D500 Iron deficiency anemia secondary to blood loss (chronic): Secondary | ICD-10-CM

## 2021-02-11 MED ORDER — ATORVASTATIN CALCIUM 20 MG PO TABS: 20.0000 mg | ORAL_TABLET | Freq: Every day | ORAL | 3 refills | Status: DC

## 2021-02-11 MED ORDER — CARVEDILOL 6.25 MG PO TABS: 6.2500 mg | ORAL_TABLET | Freq: Two times a day (BID) | ORAL | 3 refills | Status: DC

## 2021-02-11 MED ORDER — POTASSIUM CHLORIDE ER 10 MEQ PO TBCR: 10.0000 meq | EXTENDED_RELEASE_TABLET | Freq: Every day | ORAL | 3 refills | Status: DC

## 2021-02-11 MED ORDER — FUROSEMIDE 40 MG PO TABS: 40.0000 mg | ORAL_TABLET | Freq: Every day | ORAL | 1 refills | Status: DC

## 2021-02-11 MED ORDER — AMLODIPINE BESYLATE 5 MG PO TABS: 5.0000 mg | ORAL_TABLET | Freq: Every day | ORAL | 1 refills | Status: DC

## 2021-02-11 MED ORDER — DICYCLOMINE HCL 10 MG PO CAPS: 10.0000 mg | ORAL_CAPSULE | Freq: Three times a day (TID) | ORAL | 5 refills | Status: DC

## 2021-02-11 MED ORDER — ALLOPURINOL 300 MG PO TABS: 150.0000 mg | ORAL_TABLET | Freq: Every day | ORAL | 3 refills | Status: DC

## 2021-02-11 NOTE — Progress Notes (Signed)
[name] , 07-Mar-1929, 87 y.o., male MRN: [id number] Patient Care Team    Relationship Specialty Notifications Start End  [name], DO PCP - General Family Medicine  12/18/15   [name], MD PCP - Cardiology Cardiology  03/17/17   [name], MD Consulting Physician Ophthalmology  12/18/15   [name], MD Consulting Physician Dermatology  12/18/15   [name], MD Consulting Physician Cardiology  12/18/15   [name], MD Consulting Physician Ophthalmology  12/19/15   [name], MD Consulting Physician Oncology  09/30/16   [name], MD Attending Physician Nephrology  09/30/16   [name], MD Consulting Physician Dermatology  09/30/16   Franchot Gallo, MD Consulting Physician Urology  06/30/19   Inocencio Homes, DPM Consulting Physician Podiatry  06/30/19     Chief Complaint  Patient presents with   Hypertension    [facility];     Subjective: [name] is a 87 y.o. male present for [facility] Hypertension//A.Fib/IDA/chronic anticoagulation Patient reports compliance with  Coreg 6.25  twice a day and amlodipine 5 mg QD.  He has been needing to use his Lasix at 40 mg dose schedule daily now.  He does feel this is controlling his lower extremity edema.   He takes fish oil supplementation of 1000 mg daily. Patient denies chest pain, shortness of breath, dizziness or lower extremity edema.  Diet: Low sodium diet followed Exercise: very active RF: CKD3, HLD, CAD w/ CABG (1999), cardiac cath 2011 2/2 CP (all grafts normal), PVD, Afib Significant cardiac history of CAD with prior CABG in 1999 -LIMA to the LAD, SVG to IM, OM1, OM 2, SVG to D1, SVG to PDA/PLA, EF 45-50% with mild diffuse hypokinesis. Grafts patent by cath 2011   Nuclear stress test 08/2015 scar, no ischemia LVEF 70% also has atrial fibrillation/flutter in 2014 on Eliquis status post [facility] 10/2014, PVD, claudication.  Cardioversion 04/01/2017.  Pacemaker implant 07/28/2017.  mild RAE    BPH/cancer anterior wall of urinary bladder: Prediabetes:  He has been a prediabetic -well controlled with by diet in the past.  Last A1c 5.9 Gout: Patient reports gout is well controlled on allopurinol 150 mg daily. CKD/vitamin D deficiency: Patient reports compliance 5000 units vitamin D daily. IBS: Patient reports his IBS is well controlled-but he does have take the Bentyl as needed.  Allergies  Allergen Reactions   Penicillins Hives    Has patient had a PCN reaction causing immediate rash, facial/tongue/throat swelling, SOB or lightheadedness with hypotension: No Has patient had a PCN reaction causing severe rash involving mucus membranes or skin necrosis: Yes Has patient had a PCN reaction that required hospitalization: No Has patient had a PCN reaction occurring within the last 10 years: No If all of the above answers are "NO", then may proceed with Cephalosporin use.    Vancomycin Other (See Comments)    Edema and myalgia   Social History   Tobacco Use   Smoking status: Former   Smokeless tobacco: Former    Types: Chew    Quit date: 02/03/1969  Substance Use Topics   Alcohol use: No    Alcohol/week: 0.0 standard drinks   Past Medical History:  Diagnosis Date   A-fib ([place])    Basal cell carcinoma    skin   Bigeminal rhythm    Bradycardia 06/21/2017   Cancer of anterior wall of urinary bladder ([place]) 07/18/2019   Cataract    Chronic renal insufficiency, stage 3 (  moderate) ([place]) 2018   GFR 30s-40s   Coronary artery disease    post bypass   CVD (cardiovascular disease)    Diabetes mellitus without complication ([place])    type 2 diet controlled   Diverticular disease    Easy bruising    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   Gout    Hematuria 06/30/2019   Hernia    Hyperkalemia 05/06/2017   Hyperlipidemia    Hypertension    IBS (irritable bowel syndrome)    Iron deficiency anemia 02/04/2016   Macular degeneration    Microscopic colitis    PVD (peripheral  vascular disease) ([place])    Past Surgical History:  Procedure Laterality Date   CARDIAC CATHETERIZATION  2006   CARDIOVERSION N/A 02/22/2013   Procedure: CARDIOVERSION;  Surgeon: [name], MD;  Location: [place];  Service: Cardiovascular;  Laterality: N/A;   CARDIOVERSION N/A 11/15/2014   Procedure: CARDIOVERSION;  Surgeon: [name], MD;  Location: [place];  Service: Cardiovascular;  Laterality: N/A;   CARDIOVERSION N/A 04/01/2017   Procedure: CARDIOVERSION;  Surgeon: Larey Dresser, MD;  Location: [place];  Service: Cardiovascular;  Laterality: N/A;   CAROTID ENDARTERECTOMY  2009/ 1993   left/ right   COLONOSCOPY  03/17/2005   The colon is normal.   CORONARY ARTERY BYPASS GRAFT  1999   CYSTOSCOPY W/ RETROGRADES Bilateral 07/14/2019   Procedure: CYSTOSCOPY WITH RETROGRADE PYELOGRAM;  Surgeon: Franchot Gallo, MD;  Location[facility];  Service: Urology;  Laterality: Bilateral;   ESOPHAGOGASTRODUODENOSCOPY  03/17/2005   Normal esophagus. Normal Stomanch. Normal duodenum.    EYE SURGERY     eyelid repair   INGUINAL HERNIA REPAIR  02/11/2012   Procedure: LAPAROSCOPIC BILATERAL INGUINAL HERNIA REPAIR;  Surgeon: Pedro Earls, MD;  Location: [facility];  Service: General;  Laterality: Bilateral;   PACEMAKER IMPLANT N/A 07/28/2017   [employer] Medical Assurity MRI conditional  dual-chamber pacemaker for symptomatic second degree AV block by Dr Rayann Heman   SKIN CANCER EXCISION     right ear x 3   TRANSURETHRAL RESECTION OF BLADDER TUMOR N/A 09/05/2019   Procedure: TRANSURETHRAL RESECTION OF BLADDER TUMOR (TURBT);  Surgeon: Franchot Gallo, MD;  Location: [facility];  Service: Urology;  Laterality: N/A;   TRANSURETHRAL RESECTION OF BLADDER TUMOR WITH MITOMYCIN-C N/A 07/14/2019   Procedure: TRANSURETHRAL RESECTION OF BLADDER TUMOR WITH GEMCITABINE IN PACU;  Surgeon: Franchot Gallo, MD;  Location: [facility];  Service:  Urology;  Laterality: N/A;   Family History  Problem Relation Age of Onset   Stroke Mother    Coronary artery disease Father    Heart disease Father    Melanoma Sister    Colon cancer Neg Hx    Allergies as of 02/11/2021       Reactions   Penicillins Hives   Has patient had a PCN reaction causing immediate rash, facial/tongue/throat swelling, SOB or lightheadedness with hypotension: No Has patient had a PCN reaction causing severe rash involving mucus membranes or skin necrosis: Yes Has patient had a PCN reaction that required hospitalization: No Has patient had a PCN reaction occurring within the last 10 years: No If all of the above answers are "NO", then may proceed with Cephalosporin use.   Vancomycin Other (See Comments)   Edema and myalgia        Medication List        Accurate as of February 11, 2021  2:11 PM. If you have any questions,  ask your nurse or doctor.          Accu-Chek FastClix Lancets Misc Check fasting blood sugar once daily   Accu-Chek Guide test strip Generic drug: glucose blood Check fasting blood sugar once daily   allopurinol 300 MG tablet Commonly known as: ZYLOPRIM Take 0.5 tablets (150 mg total) by mouth daily.   amLODipine 5 MG tablet Commonly known as: NORVASC Take 1 tablet (5 mg total) by mouth daily.   atorvastatin 20 MG tablet Commonly known as: LIPITOR TAKE 1 TABLET AT BEDTIME   blood glucose meter kit and supplies Dispense based on patient and insurance preference. Use up to four times daily as directed. R73.03   carvedilol 6.25 MG tablet Commonly known as: COREG Take 1 tablet (6.25 mg total) by mouth 2 (two) times daily with a meal.   Cholecalciferol 125 MCG (5000 UT) capsule Take 5,000 Units by mouth daily.   CHROMIUM GTF PO Take 1 capsule by mouth every evening.   Cinnamon 500 MG Tabs Take 2 tablets by mouth at bedtime.   dicyclomine 10 MG capsule Commonly known as: BENTYL TAKE 1 CAPSULE (10 MG TOTAL) BY  MOUTH 4 (FOUR) TIMES DAILY - BEFORE MEALS AND AT BEDTIME.   diphenoxylate-atropine 2.5-0.025 MG tablet Commonly known as: LOMOTIL 1 tablet. Pt takes PRN   Eliquis 2.5 MG Tabs tablet Generic drug: apixaban TAKE 1 TABLET TWICE A DAY   finasteride 5 MG tablet Commonly known as: PROSCAR Take 5 mg by mouth daily.   fish oil-omega-3 fatty acids 1000 MG capsule Take 1 g by mouth daily.   furosemide 20 MG tablet Commonly known as: LASIX Take 2 tablets (40 mg total) by mouth daily.   latanoprost 0.005 % ophthalmic solution Commonly known as: XALATAN Place 1 drop into both eyes at bedtime.   potassium chloride 10 MEQ tablet Commonly known as: KLOR-CON Take 1 tablet (10 mEq total) by mouth daily.   PRESERVISION AREDS PO Take 1 tablet by mouth 2 (two) times daily.   PROBIOTIC PO Take 1 tablet by mouth daily.   tamsulosin 0.4 MG Caps capsule Commonly known as: FLOMAX Take 0.4 mg by mouth.   timolol 0.5 % ophthalmic solution Commonly known as: TIMOPTIC Place 1 drop into both eyes 2 (two) times daily.   UNABLE TO FIND Med Name: Mito Q   VANADYL SULFATE PO Take 1 capsule by mouth every evening.   zinc gluconate 50 MG tablet Take 50 mg by mouth daily.        No results found for this or any previous visit (from the past 24 hour(s)). No results found.   ROS: Negative, with the exception of above mentioned in HPI  Objective:  BP 134/84   Pulse 60   Temp 97.6 F (36.4 C) (Oral)   Ht '5\' 8"'  (1.727 m)   Wt 154 lb (69.9 kg)   SpO2 97%   BMI 23.42 kg/m  Body mass index is 23.42 kg/m.  Gen: Afebrile. No acute distress. Very pleasant HOH male.  HENT: AT. [place]. No cough or hoarseness.  Eyes:Pupils Equal Round Reactive to light, Extraocular movements intact,  Conjunctiva without redness, discharge or icterus. Neck/lymp/endocrine: Supple,no lymphadenopathy, no thyromegaly CV: RRR 1/6 SM, +1-2 edema Chest: CTAB, no wheeze or crackles Skin: no rashes, purpura or  petechiae.  Neuro: Normal gait. PERLA. EOMi. Alert. Oriented x3 Psych: Normal affect, dress and demeanor. Normal speech. Normal thought content and judgment..    Assessment/Plan: JOVANE FOUTZ is a 87 y.o.  male present for OV for  Essential hypertension/Paroxysmal atrial fibrillation (HCC)/Stage 3 chronic kidney disease/HLD//CHF/CAD/PVD Stable -Continue amlodipine 5 mg daily> cardio -Continue coreg 6.25 mg twice daily. -Continue Eliquis>cardio -Continue Lasix 40 mg daily  - continue  fish oil supplementation -  he  has difficulty controlling potassium on ace/arb-  lisinopril was dc'd in the past 2/2 to hyperkalemia - now he is on a potasium supplement daily to maintain level> continue K-Dur 10 mEq daily. - Low-sodium diet.  -  He BP has been  very difficult to control at times and his hydration and IDA/epo injections cause fluctuations leading to both highs and lower than desired readings. Certainly would rather mildly elevated BP over too low. -TSH, LDL and lipids collected today - Follow-up 5.5 months, as long as seeing Dr. Johnsie Cancel routinely as well.  Anemias IDA and EPO:  - managed by heme/onc.  Patient reports he is feeling pretty good  Prediabetes Patient has been very well diet controlled. 5.7> 6.4> 6.5>5.9>6.5> A1c collected today collected today Continue dietary modifications and exercise.  Gout, unspecified cause, unspecified chronicity, unspecified site Stable.  Continue allopurinol Continue renally dosed allopurinol  Hematuria/BPH/bladder cancer: Patient reports his bladder cancer reported responded well to treatment.  He is prescribed Flomax and Proscar by urology.  CKD 3: Reviewed BMP collected last week by specialty team. Avoid NSAIDs Renally dose medications PTH/calcium and vitamin D collected today  Influenza vaccination provided today  Meds managed by primary team: [facility]: Lasix, potassium, Bentyl [facility] scripts: Coreg, Lipitor, allopurinol  Orders  Placed This Encounter  Procedures   Flu Vaccine QUAD High Dose(Fluad)   No orders of the defined types were placed in this encounter.  Referral Orders  No referral(s) requested today     electronically signed by:  Howard Pouch, DO  [place]

## 2021-02-12 ENCOUNTER — Encounter: Payer: Self-pay | Admitting: Vascular Surgery

## 2021-02-12 ENCOUNTER — Ambulatory Visit (INDEPENDENT_AMBULATORY_CARE_PROVIDER_SITE_OTHER): Payer: Medicare Other | Admitting: Vascular Surgery

## 2021-02-12 ENCOUNTER — Other Ambulatory Visit: Payer: Self-pay | Admitting: Family Medicine

## 2021-02-12 VITALS — BP 161/58 | HR 65 | Temp 98.2°F | Resp 20 | Ht 68.0 in | Wt 154.0 lb

## 2021-02-12 DIAGNOSIS — I739 Peripheral vascular disease, unspecified: Secondary | ICD-10-CM

## 2021-02-12 DIAGNOSIS — I872 Venous insufficiency (chronic) (peripheral): Secondary | ICD-10-CM | POA: Diagnosis not present

## 2021-02-12 LAB — TSH: TSH: 4.2 mIU/L (ref 0.40–4.50)

## 2021-02-12 LAB — PTH, INTACT AND CALCIUM
Calcium: 8.8 mg/dL (ref 8.6–10.3)
PTH: 55 pg/mL (ref 16–77)

## 2021-02-12 LAB — HEMOGLOBIN A1C
Hgb A1c MFr Bld: 6 % of total Hgb — ABNORMAL HIGH (ref <5.7)
Mean Plasma Glucose: 126 mg/dL
eAG (mmol/L): 7 mmol/L

## 2021-02-12 LAB — VITAMIN D 25 HYDROXY (VIT D DEFICIENCY, FRACTURES): Vit D, 25-Hydroxy: 52 ng/mL (ref 30–100)

## 2021-02-12 LAB — LDL CHOLESTEROL, DIRECT: Direct LDL: 49 mg/dL (ref <100)

## 2021-02-12 MED ORDER — ONETOUCH VERIO W/DEVICE KIT: 1.0000 | PACK | Freq: Once | 1 refills | Status: AC

## 2021-02-12 MED ORDER — ONETOUCH VERIO VI STRP: ORAL_STRIP | 12 refills | Status: DC

## 2021-02-12 MED ORDER — ONETOUCH ULTRASOFT LANCETS MISC: 12 refills | Status: DC

## 2021-02-12 NOTE — Addendum Note (Signed)
Addended by: Kavin Leech on: 02/12/2021 01:47 PM   Modules accepted: Orders

## 2021-02-21 ENCOUNTER — Other Ambulatory Visit: Payer: Self-pay | Admitting: Cardiovascular Disease

## 2021-02-21 DIAGNOSIS — I4819 Other persistent atrial fibrillation: Secondary | ICD-10-CM

## 2021-02-21 NOTE — Telephone Encounter (Signed)
Prescription refill request for Eliquis received.  Indication: afib  Last office visit: Nishan, 12/14/2020 Scr: 1.78, 02/05/2021 Age: 87 yo  Weight: 69.9 kg   Refill sent.

## 2021-02-27 DIAGNOSIS — C678 Malignant neoplasm of overlapping sites of bladder: Secondary | ICD-10-CM | POA: Diagnosis not present

## 2021-03-07 ENCOUNTER — Telehealth: Payer: Self-pay | Admitting: Family Medicine

## 2021-03-07 ENCOUNTER — Other Ambulatory Visit: Payer: Self-pay

## 2021-03-07 MED ORDER — AMLODIPINE BESYLATE 5 MG PO TABS: 5.0000 mg | ORAL_TABLET | Freq: Every day | ORAL | 0 refills | Status: DC

## 2021-03-07 NOTE — Telephone Encounter (Signed)
Pt has not received mail in Rx and will contact pharmacy to find out what is going on and if new Rx is needed. Advised pt to have pharmacy contact us if needing new Rx.   Pt almost out of medication. 14 d/s [facility] to [facility]

## 2021-03-07 NOTE — Telephone Encounter (Signed)
Pt has not received mail in Rx and will contact pharmacy to find out what is going on and if new Rx is needed. Advised pt to have pharmacy contact us if needing new Rx.  Pt almost out of medication

## 2021-03-07 NOTE — Telephone Encounter (Signed)
Pt says the pharmacy told him his amLODipine  Rx has expired, It was last put in 02/11/21 with one refill, so there must be some confusion.

## 2021-03-18 DIAGNOSIS — L603 Nail dystrophy: Secondary | ICD-10-CM | POA: Diagnosis not present

## 2021-03-18 DIAGNOSIS — E1151 Type 2 diabetes mellitus with diabetic peripheral angiopathy without gangrene: Secondary | ICD-10-CM | POA: Diagnosis not present

## 2021-03-18 DIAGNOSIS — L84 Corns and callosities: Secondary | ICD-10-CM | POA: Diagnosis not present

## 2021-03-18 DIAGNOSIS — I739 Peripheral vascular disease, unspecified: Secondary | ICD-10-CM | POA: Diagnosis not present

## 2021-03-20 ENCOUNTER — Inpatient Hospital Stay: Payer: Medicare Other | Attending: Hematology & Oncology

## 2021-03-20 ENCOUNTER — Inpatient Hospital Stay: Payer: Medicare Other

## 2021-03-20 ENCOUNTER — Encounter: Payer: Self-pay | Admitting: Hematology & Oncology

## 2021-03-20 ENCOUNTER — Telehealth: Payer: Self-pay | Admitting: *Deleted

## 2021-03-20 ENCOUNTER — Inpatient Hospital Stay (HOSPITAL_BASED_OUTPATIENT_CLINIC_OR_DEPARTMENT_OTHER): Payer: Medicare Other | Admitting: Hematology & Oncology

## 2021-03-20 ENCOUNTER — Other Ambulatory Visit: Payer: Self-pay

## 2021-03-20 VITALS — BP 171/50 | HR 75 | Temp 98.4°F | Resp 18 | Wt 151.0 lb

## 2021-03-20 DIAGNOSIS — D631 Anemia in chronic kidney disease: Secondary | ICD-10-CM | POA: Insufficient documentation

## 2021-03-20 DIAGNOSIS — D500 Iron deficiency anemia secondary to blood loss (chronic): Secondary | ICD-10-CM | POA: Diagnosis not present

## 2021-03-20 DIAGNOSIS — N189 Chronic kidney disease, unspecified: Secondary | ICD-10-CM | POA: Diagnosis not present

## 2021-03-20 DIAGNOSIS — D509 Iron deficiency anemia, unspecified: Secondary | ICD-10-CM | POA: Diagnosis not present

## 2021-03-20 DIAGNOSIS — Z79899 Other long term (current) drug therapy: Secondary | ICD-10-CM | POA: Insufficient documentation

## 2021-03-20 DIAGNOSIS — C673 Malignant neoplasm of anterior wall of bladder: Secondary | ICD-10-CM

## 2021-03-20 LAB — CMP (CANCER CENTER ONLY)
ALT: 24 U/L (ref 0–44)
AST: 28 U/L (ref 15–41)
Albumin: 4.1 g/dL (ref 3.5–5.0)
Alkaline Phosphatase: 66 U/L (ref 38–126)
Anion gap: 8 (ref 5–15)
BUN: 38 mg/dL — ABNORMAL HIGH (ref 8–23)
CO2: 28 mmol/L (ref 22–32)
Calcium: 9.4 mg/dL (ref 8.9–10.3)
Chloride: 104 mmol/L (ref 98–111)
Creatinine: 1.92 mg/dL — ABNORMAL HIGH (ref 0.61–1.24)
GFR, Estimated: 33 mL/min — ABNORMAL LOW (ref >60)
Glucose, Bld: 165 mg/dL — ABNORMAL HIGH (ref 70–99)
Potassium: 4.4 mmol/L (ref 3.5–5.1)
Sodium: 140 mmol/L (ref 135–145)
Total Bilirubin: 0.6 mg/dL (ref 0.3–1.2)
Total Protein: 6.9 g/dL (ref 6.5–8.1)

## 2021-03-20 LAB — CBC WITH DIFFERENTIAL (CANCER CENTER ONLY)
Abs Immature Granulocytes: 0.02 10*3/uL (ref 0.00–0.07)
Basophils Absolute: 0 10*3/uL (ref 0.0–0.1)
Basophils Relative: 0 %
Eosinophils Absolute: 0.1 10*3/uL (ref 0.0–0.5)
Eosinophils Relative: 2 %
HCT: 35.8 % — ABNORMAL LOW (ref 39.0–52.0)
Hemoglobin: 11.9 g/dL — ABNORMAL LOW (ref 13.0–17.0)
Immature Granulocytes: 0 %
Lymphocytes Relative: 31 %
Lymphs Abs: 2 10*3/uL (ref 0.7–4.0)
MCH: 29.8 pg (ref 26.0–34.0)
MCHC: 33.2 g/dL (ref 30.0–36.0)
MCV: 89.5 fL (ref 80.0–100.0)
Monocytes Absolute: 0.5 10*3/uL (ref 0.1–1.0)
Monocytes Relative: 8 %
Neutro Abs: 3.9 10*3/uL (ref 1.7–7.7)
Neutrophils Relative %: 59 %
Platelet Count: 137 10*3/uL — ABNORMAL LOW (ref 150–400)
RBC: 4 MIL/uL — ABNORMAL LOW (ref 4.22–5.81)
RDW: 15.5 % (ref 11.5–15.5)
WBC Count: 6.6 10*3/uL (ref 4.0–10.5)
nRBC: 0 % (ref 0.0–0.2)

## 2021-03-20 NOTE — Progress Notes (Signed)
Hematology and Oncology Follow Up Visit  [name] [id number] 05/20/1928 87 y.o. 03/20/2021   Principle Diagnosis:  Erythropoietin deficiency anemia Iron deficiency anemia  Current Therapy:   IV iron as indicated -- feraheme given on 07/27/2019 Aranesp 300 g sq prn hemoglobin < 11 - last dose given on  02/05/2021   Interim History:  [name] is here today for follow-up.  He is looking quite good.  Unfortunately, his wife's have little bit of a tough time.  It sounds like she might be going in and out of atrial fibrillation.    His last iron studies that were done back in October showed a ferritin of 232 with an iron saturation of 23%.  He has had no problems with his bladder.  I think he sees the urologist every 6 months.  He has had no fever.  He has had no cough or shortness of breath.  He does have some renal insufficiency but this is holding steady.  Overall, I would say performance status is probably ECOG 2.     Medications:  Allergies as of 03/20/2021       Reactions   Penicillins Hives   Has patient had a PCN reaction causing immediate rash, facial/tongue/throat swelling, SOB or lightheadedness with hypotension: No Has patient had a PCN reaction causing severe rash involving mucus membranes or skin necrosis: Yes Has patient had a PCN reaction that required hospitalization: No Has patient had a PCN reaction occurring within the last 10 years: No If all of the above answers are "NO", then may proceed with Cephalosporin use.   Vancomycin Other (See Comments)   Edema and myalgia        Medication List        Accurate as of March 20, 2021 12:36 PM. If you have any questions, ask your nurse or doctor.          allopurinol 300 MG tablet Commonly known as: ZYLOPRIM Take 0.5 tablets (150 mg total) by mouth daily.   amLODipine 5 MG tablet Commonly known as: NORVASC Take 1 tablet (5 mg total) by mouth daily.   amLODipine 5 MG tablet Commonly  known as: NORVASC Take 1 tablet (5 mg total) by mouth daily for 14 days.   atorvastatin 20 MG tablet Commonly known as: LIPITOR Take 1 tablet (20 mg total) by mouth at bedtime.   blood glucose meter kit and supplies Dispense based on patient and insurance preference. Use up to four times daily as directed. R73.03   carvedilol 6.25 MG tablet Commonly known as: COREG Take 1 tablet (6.25 mg total) by mouth 2 (two) times daily with a meal.   Cholecalciferol 125 MCG (5000 UT) capsule Take 5,000 Units by mouth daily.   CHROMIUM GTF PO Take 1 capsule by mouth every evening.   Cinnamon 500 MG Tabs Take 2 tablets by mouth at bedtime.   dicyclomine 10 MG capsule Commonly known as: BENTYL Take 1 capsule (10 mg total) by mouth 4 (four) times daily -  before meals and at bedtime.   diphenoxylate-atropine 2.5-0.025 MG tablet Commonly known as: LOMOTIL 1 tablet. Pt takes PRN   Eliquis 2.5 MG Tabs tablet Generic drug: apixaban TAKE 1 TABLET TWICE A DAY   finasteride 5 MG tablet Commonly known as: PROSCAR Take 5 mg by mouth daily.   fish oil-omega-3 fatty acids 1000 MG capsule Take 1 g by mouth daily.   furosemide 40 MG tablet Commonly known as: LASIX Take 1 tablet (40 mg  total) by mouth daily.   latanoprost 0.005 % ophthalmic solution Commonly known as: XALATAN Place 1 drop into both eyes at bedtime.   onetouch ultrasoft lancets Use as instructed   OneTouch Verio test strip Generic drug: glucose blood Use as instructed   potassium chloride 10 MEQ tablet Commonly known as: KLOR-CON Take 1 tablet (10 mEq total) by mouth daily.   PRESERVISION AREDS PO Take 1 tablet by mouth 2 (two) times daily.   PROBIOTIC PO Take 1 tablet by mouth daily.   tamsulosin 0.4 MG Caps capsule Commonly known as: FLOMAX Take 0.4 mg by mouth.   timolol 0.5 % ophthalmic solution Commonly known as: TIMOPTIC Place 1 drop into both eyes 2 (two) times daily.   UNABLE TO FIND Med Name: Mito  Q   VANADYL SULFATE PO Take 1 capsule by mouth every evening.   zinc gluconate 50 MG tablet Take 50 mg by mouth daily.        Allergies:  Allergies  Allergen Reactions   Penicillins Hives    Has patient had a PCN reaction causing immediate rash, facial/tongue/throat swelling, SOB or lightheadedness with hypotension: No Has patient had a PCN reaction causing severe rash involving mucus membranes or skin necrosis: Yes Has patient had a PCN reaction that required hospitalization: No Has patient had a PCN reaction occurring within the last 10 years: No If all of the above answers are "NO", then may proceed with Cephalosporin use.    Vancomycin Other (See Comments)    Edema and myalgia    Past Medical History, Surgical history, Social history, and Family History were reviewed and updated.  Review of Systems: Review of Systems  Constitutional: Negative.   HENT: Negative.    Eyes: Negative.   Respiratory: Negative.    Cardiovascular:  Positive for palpitations.  Gastrointestinal: Negative.   Genitourinary: Negative.   Musculoskeletal: Negative.   Skin: Negative.   Neurological: Negative.   Endo/Heme/Allergies: Negative.   Psychiatric/Behavioral: Negative.      Physical Exam:  weight is 151 lb (68.5 kg). His oral temperature is 98.4 F (36.9 C). His blood pressure is 171/50 (abnormal) and his pulse is 75. His respiration is 18 and oxygen saturation is 98%.   Wt Readings from Last 3 Encounters:  03/20/21 151 lb (68.5 kg)  02/12/21 154 lb (69.9 kg)  02/11/21 154 lb (69.9 kg)    Physical Exam Vitals reviewed.  HENT:     Head: Normocephalic and atraumatic.  Eyes:     Pupils: Pupils are equal, round, and reactive to light.  Cardiovascular:     Rate and Rhythm: Normal rate and regular rhythm.     Heart sounds: Normal heart sounds.  Pulmonary:     Effort: Pulmonary effort is normal.     Breath sounds: Normal breath sounds.  Abdominal:     General: Bowel sounds are  normal.     Palpations: Abdomen is soft.  Musculoskeletal:        General: No tenderness or deformity. Normal range of motion.     Cervical back: Normal range of motion.  Lymphadenopathy:     Cervical: No cervical adenopathy.  Skin:    General: Skin is warm and dry.     Findings: No erythema or rash.  Neurological:     Mental Status: He is alert and oriented to person, place, and time.  Psychiatric:        Behavior: Behavior normal.        Thought Content: Thought content  normal.        Judgment: Judgment normal.     Lab Results  Component Value Date   WBC 6.6 03/20/2021   HGB 11.9 (L) 03/20/2021   HCT 35.8 (L) 03/20/2021   MCV 89.5 03/20/2021   PLT 137 (L) 03/20/2021   Lab Results  Component Value Date   FERRITIN 232 02/05/2021   IRON 71 02/05/2021   TIBC 308 02/05/2021   UIBC 237 02/05/2021   IRONPCTSAT 23 02/05/2021   Lab Results  Component Value Date   RETICCTPCT 1.3 02/05/2021   RBC 4.00 (L) 03/20/2021   No results found for: KPAFRELGTCHN, LAMBDASER, KAPLAMBRATIO No results found for: Kandis Cocking, IGMSERUM Lab Results  Component Value Date   TOTALPROTELP 6.0 (L) 12/26/2015   ALBUMINELP 3.4 (L) 12/26/2015   A1GS 0.4 (H) 12/26/2015   A2GS 0.8 12/26/2015   BETS 0.4 12/26/2015   BETA2SER 0.3 12/26/2015   GAMS 0.8 12/26/2015   MSPIKE Not Observed 01/29/2016   SPEI SEE NOTE 12/26/2015     Chemistry      Component Value Date/Time   NA 140 02/05/2021 1115   NA 141 08/11/2017 0924   NA 145 04/09/2017 0905   NA 140 03/28/2016 0853   K 4.9 02/05/2021 1115   K 4.5 04/09/2017 0905   K 4.4 03/28/2016 0853   CL 107 02/05/2021 1115   CL 105 04/09/2017 0905   CO2 26 02/05/2021 1115   CO2 27 04/09/2017 0905   CO2 19 (L) 03/28/2016 0853   BUN 33 (H) 02/05/2021 1115   BUN 27 08/11/2017 0924   BUN 33 (H) 04/09/2017 0905   BUN 34.4 (H) 03/28/2016 0853   CREATININE 1.78 (H) 02/05/2021 1115   CREATININE 1.72 (H) 08/18/2017 1543   CREATININE 1.7 (H) 03/28/2016  0853   GLU 113 07/24/2016 0000      Component Value Date/Time   CALCIUM 8.8 02/11/2021 1433   CALCIUM 9.2 04/09/2017 0905   CALCIUM 9.2 03/28/2016 0853   ALKPHOS 69 02/05/2021 1115   ALKPHOS 69 04/09/2017 0905   ALKPHOS 85 03/28/2016 0853   AST 25 02/05/2021 1115   AST 23 03/28/2016 0853   ALT 21 02/05/2021 1115   ALT 27 04/09/2017 0905   ALT 37 03/28/2016 0853   BILITOT 0.6 02/05/2021 1115   BILITOT 0.79 03/28/2016 0853      Impression and Plan: [name] is a very pleasant 87 yo caucasian gentleman with both erythropoietin deficiency and iron deficiency anemia.    I am very amazed as to his blood count being so good right now.  This probably best as hemoglobin has been in a while.  He does not need any Aranesp.  I will think his iron studies should be okay.  We will plan to get him back right before Christmas just to make sure that everything is okay.    [name], MD 11/23/202212:36 PM

## 2021-03-20 NOTE — Telephone Encounter (Signed)
Per 03/20/21 los - gave upcoming appointments - confirmed

## 2021-03-22 LAB — IRON AND TIBC
Iron: 87 ug/dL (ref 42–163)
Saturation Ratios: 33 % (ref 20–55)
TIBC: 265 ug/dL (ref 202–409)
UIBC: 178 ug/dL (ref 117–376)

## 2021-03-22 LAB — FERRITIN: Ferritin: 272 ng/mL (ref 24–336)

## 2021-04-01 ENCOUNTER — Telehealth: Payer: Self-pay | Admitting: Family Medicine

## 2021-04-01 NOTE — Telephone Encounter (Signed)
Pt wife called and said her and her husband tested positive for covid on 03/30/21 and wanted to see if they can be seen for a virtual visit today. Please advise

## 2021-04-01 NOTE — Telephone Encounter (Signed)
Pt was called by Liane Comber and didn't want to see any other provider. Will CB tomorrow for any possible openings.

## 2021-04-02 ENCOUNTER — Telehealth (INDEPENDENT_AMBULATORY_CARE_PROVIDER_SITE_OTHER): Payer: Medicare Other | Admitting: Medical

## 2021-04-02 VITALS — BP 153/73 | HR 68 | Temp 98.0°F

## 2021-04-02 DIAGNOSIS — U071 COVID-19: Secondary | ICD-10-CM | POA: Diagnosis not present

## 2021-04-02 DIAGNOSIS — I25708 Atherosclerosis of coronary artery bypass graft(s), unspecified, with other forms of angina pectoris: Secondary | ICD-10-CM

## 2021-04-02 DIAGNOSIS — R051 Acute cough: Secondary | ICD-10-CM

## 2021-04-02 DIAGNOSIS — R0981 Nasal congestion: Secondary | ICD-10-CM

## 2021-04-02 MED ORDER — MOLNUPIRAVIR EUA 200MG CAPSULE: 4.0000 | ORAL_CAPSULE | Freq: Two times a day (BID) | ORAL | 0 refills | Status: AC

## 2021-04-02 MED ORDER — FLUTICASONE PROPIONATE 50 MCG/ACT NA SUSP: 2.0000 | Freq: Every day | NASAL | 1 refills | Status: DC

## 2021-04-02 MED ORDER — BENZONATATE 100 MG PO CAPS: 100.0000 mg | ORAL_CAPSULE | Freq: Three times a day (TID) | ORAL | 0 refills | Status: DC | PRN

## 2021-04-02 NOTE — Progress Notes (Signed)
Subjective:    Patient ID: [name], male    DOB: June 15, 1928, 87 y.o.   MRN: [id number]  HPI  Virtual Visit via Video Note  I connected with [name] on 04/02/21 at  9:20 AM EST by a video enabled telemedicine application and verified that I am speaking with the correct person using two identifiers.  Location: Patient: home Provider: home   I discussed the limitations of evaluation and management by telemedicine and the availability of in person appointments. The patient expressed understanding and agreed to proceed.  History of Present Illness: Pt recently diagnosed with covid on   Test used-otc.  History of covid vaccine x 3.  Covid risk score- 8  History of covid infection-no  Current symptoms-this past Saturday he tested + for covid. Day after he started to get nasal congestion with some dry cough. Mild nasal congestion. He states he feels ok. No fever, no chills, no sweats and not bodyaches.  Pt states feels like has cold.   Pt 02 sat-no     Observations/Objective:  General-no acute distress, pleasant, oriented. Lungs- on inspection lungs appear unlabored. Neck- no tracheal deviation or jvd on inspection. Neuro- gross motor function appears intact.   Assessment and Plan:  Patient Instructions  COVID infection in elderly patient but very mild symptom description "like a cold".  Formally vaccinated x3 and rescore complication of 8.  Counseled patient regarding his increased risk and asked him to update [place] in the event of worsening signs and symptoms.  Patient does have decreased GFR and medications that would interact with Paxlovid.  Decided to prescribe molnupiravir antiviral.  Also prescribing Flonase for nasal congestion and benzonatate for cough.  If getting symptoms of secondary infection such as increasing chest congestion or sinus pressure was noted.  For more productive cough did go ahead and place chest x-ray order. Can get that done at the  med center in the event that does occur.  Patient expressed understanding.  Follow-up in 5 to 7 days or sooner if needed.  Encouraged and asked patient to give [place] an update later in the week as to how he is doing.  If significant worsening signs /symptoms then advised emergency department evaluation.  Mackie Pai, PA-C    Follow Up Instructions:    I discussed the assessment and treatment plan with the patient. The patient was provided an opportunity to ask questions and all were answered. The patient agreed with the plan and demonstrated an understanding of the instructions.   The patient was advised to call back or seek an in-person evaluation if the symptoms worsen or if the condition fails to improve as anticipated.  Time spent with patient today was  20 minutes which consisted of chart revdiew, discussing diagnosis, work up treatment and documentation.    Mackie Pai, PA-C    Review of Systems  Constitutional:  Negative for chills, fatigue and fever.  HENT:  Positive for congestion. Negative for sinus pressure, sinus pain and sore throat.   Respiratory:  Positive for cough. Negative for shortness of breath and wheezing.   Cardiovascular:  Negative for chest pain and palpitations.  Gastrointestinal:  Negative for abdominal pain and diarrhea.  Musculoskeletal:  Negative for back pain.  Neurological:  Negative for dizziness and headaches.  Hematological:  Negative for adenopathy. Does not bruise/bleed easily.  Psychiatric/Behavioral:  Negative for behavioral problems. The patient is not nervous/anxious.    Past Medical History:  Diagnosis Date   A-fib ([place])  Basal cell carcinoma    skin   Bigeminal rhythm    Bradycardia 06/21/2017   Cancer of anterior wall of urinary bladder ([place]) 07/18/2019   Cataract    Chronic renal insufficiency, stage 3 (moderate) ([place]) 2018   GFR 30s-40s   Coronary artery disease    post bypass   CVD (cardiovascular disease)    Diabetes  mellitus without complication (HCC)    type 2 diet controlled   Diverticular disease    Easy bruising    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   Gout    Hematuria 06/30/2019   Hernia    Hyperkalemia 05/06/2017   Hyperlipidemia    Hypertension    IBS (irritable bowel syndrome)    Iron deficiency anemia 02/04/2016   Macular degeneration    Microscopic colitis    PVD (peripheral vascular disease) ([place])      Social History   Socioeconomic History   Marital status: Married    Spouse name: Not on file   Number of children: Not on file   Years of education: Not on file   Highest education level: Not on file  Occupational History   Occupation: retired  Tobacco Use   Smoking status: Former   Smokeless tobacco: Former    Types: Chew    Quit date: 02/03/1969  Vaping Use   Vaping Use: Never used  Substance and Sexual Activity   Alcohol use: No    Alcohol/week: 0.0 standard drinks   Drug use: No   Sexual activity: Never  Other Topics Concern   Not on file  Social History Narrative   Married to [place], 2 children [name] and [name].   Some college. Retired from [employer].   Drinks caffeine, uses herbal remedies, takes a daily vitamin.   Wears his seatbelt, smoke detector at home, firearms in the home.   Wears a hearing aid.   Feels safe in her relationships.   Social Determinants of Health   Financial Resource Strain: Not on file  Food Insecurity: Not on file  Transportation Needs: Not on file  Physical Activity: Not on file  Stress: Not on file  Social Connections: Not on file  Intimate Partner Violence: Not on file    Past Surgical History:  Procedure Laterality Date   CARDIAC CATHETERIZATION  2006   CARDIOVERSION N/A 02/22/2013   Procedure: CARDIOVERSION;  Surgeon: [name], MD;  Location: [facility];  Service: Cardiovascular;  Laterality: N/A;   CARDIOVERSION N/A 11/15/2014   Procedure: CARDIOVERSION;  Surgeon: [name], MD;  Location: [facility];  Service: Cardiovascular;  Laterality: N/A;   CARDIOVERSION N/A 04/01/2017   Procedure: CARDIOVERSION;  Surgeon: Larey Dresser, MD;  Location: [facility];  Service: Cardiovascular;  Laterality: N/A;   CAROTID ENDARTERECTOMY  2009/ 1993   left/ right   COLONOSCOPY  03/17/2005   The colon is normal.   CORONARY ARTERY BYPASS GRAFT  1999   CYSTOSCOPY W/ RETROGRADES Bilateral 07/14/2019   Procedure: CYSTOSCOPY WITH RETROGRADE PYELOGRAM;  Surgeon: Franchot Gallo, MD;  Location: [facility];  Service: Urology;  Laterality: Bilateral;   ESOPHAGOGASTRODUODENOSCOPY  03/17/2005   Normal esophagus. Normal Stomanch. Normal duodenum.    EYE SURGERY     eyelid repair   INGUINAL HERNIA REPAIR  02/11/2012   Procedure: LAPAROSCOPIC BILATERAL INGUINAL HERNIA REPAIR;  Surgeon: Pedro Earls, MD;  Location: [facility];  Service: General;  Laterality: Bilateral;   PACEMAKER IMPLANT N/A 07/28/2017   St Jude Medical Assurity MRI  conditional  dual-chamber pacemaker for symptomatic second degree AV block by Dr Rayann Heman   SKIN CANCER EXCISION     right ear x 3   TRANSURETHRAL RESECTION OF BLADDER TUMOR N/A 09/05/2019   Procedure: TRANSURETHRAL RESECTION OF BLADDER TUMOR (TURBT);  Surgeon: Franchot Gallo, MD;  Location: [facility];  Service: Urology;  Laterality: N/A;   TRANSURETHRAL RESECTION OF BLADDER TUMOR WITH MITOMYCIN-C N/A 07/14/2019   Procedure: TRANSURETHRAL RESECTION OF BLADDER TUMOR WITH GEMCITABINE IN PACU;  Surgeon: Franchot Gallo, MD;  Location[facility];  Service: Urology;  Laterality: N/A;    Family History  Problem Relation Age of Onset   Stroke Mother    Coronary artery disease Father    Heart disease Father    Melanoma Sister    Colon cancer Neg Hx     Allergies  Allergen Reactions   Penicillins Hives    Has patient had a PCN reaction causing immediate rash, facial/tongue/throat swelling, SOB or lightheadedness with  hypotension: No Has patient had a PCN reaction causing severe rash involving mucus membranes or skin necrosis: Yes Has patient had a PCN reaction that required hospitalization: No Has patient had a PCN reaction occurring within the last 10 years: No If all of the above answers are "NO", then may proceed with Cephalosporin use.    Vancomycin Other (See Comments)    Edema and myalgia    Current Outpatient Medications on File Prior to Visit  Medication Sig Dispense Refill   allopurinol (ZYLOPRIM) 300 MG tablet Take 0.5 tablets (150 mg total) by mouth daily. 45 tablet 3   amLODipine (NORVASC) 5 MG tablet Take 1 tablet (5 mg total) by mouth daily. 90 tablet 1   apixaban (ELIQUIS) 2.5 MG TABS tablet TAKE 1 TABLET TWICE A DAY 180 tablet 1   atorvastatin (LIPITOR) 20 MG tablet Take 1 tablet (20 mg total) by mouth at bedtime. 90 tablet 3   blood glucose meter kit and supplies Dispense based on patient and insurance preference. Use up to four times daily as directed. R73.03 1 each 0   carvedilol (COREG) 6.25 MG tablet Take 1 tablet (6.25 mg total) by mouth 2 (two) times daily with a meal. 180 tablet 3   Cholecalciferol 125 MCG (5000 UT) capsule Take 5,000 Units by mouth daily.     CHROMIUM GTF PO Take 1 capsule by mouth every evening.      Cinnamon 500 MG TABS Take 2 tablets by mouth at bedtime.      dicyclomine (BENTYL) 10 MG capsule Take 1 capsule (10 mg total) by mouth 4 (four) times daily -  before meals and at bedtime. 360 capsule 5   diphenoxylate-atropine (LOMOTIL) 2.5-0.025 MG tablet 1 tablet. Pt takes PRN     finasteride (PROSCAR) 5 MG tablet Take 5 mg by mouth daily.  3   fish oil-omega-3 fatty acids 1000 MG capsule Take 1 g by mouth daily.      furosemide (LASIX) 40 MG tablet Take 1 tablet (40 mg total) by mouth daily. 90 tablet 1   glucose blood (ONETOUCH VERIO) test strip Use as instructed 100 each 12   Lancets (ONETOUCH ULTRASOFT) lancets Use as instructed 100 each 12   latanoprost  (XALATAN) 0.005 % ophthalmic solution Place 1 drop into both eyes at bedtime.     Multiple Vitamins-Minerals (PRESERVISION AREDS PO) Take 1 tablet by mouth 2 (two) times daily.      potassium chloride (KLOR-CON) 10 MEQ tablet Take 1 tablet (  10 mEq total) by mouth daily. 90 tablet 3   Probiotic Product (PROBIOTIC PO) Take 1 tablet by mouth daily.     tamsulosin (FLOMAX) 0.4 MG CAPS capsule Take 0.4 mg by mouth.     timolol (TIMOPTIC) 0.5 % ophthalmic solution Place 1 drop into both eyes 2 (two) times daily.      UNABLE TO FIND Med Name: Mito Q     VANADYL SULFATE PO Take 1 capsule by mouth every evening.      zinc gluconate 50 MG tablet Take 50 mg by mouth daily.      Current Facility-Administered Medications on File Prior to Visit  Medication Dose Route Frequency Provider Last Rate Last Admin   gemcitabine (GEMZAR) chemo syringe for bladder instillation 2,000 mg  2,000 mg Bladder Instillation Once Franchot Gallo, MD        BP (!) 153/73   Pulse 68   Temp 98 F (36.7 C)        Objective:   Physical Exam        Assessment & Plan:

## 2021-04-02 NOTE — Patient Instructions (Signed)
COVID infection in elderly patient but very mild symptom description "like a cold".  Formally vaccinated x3 and rescore complication of 8.  Counseled patient regarding his increased risk and asked him to update [place] in the event of worsening signs and symptoms.  Patient does have decreased GFR and medications that would interact with Paxlovid.  Decided to prescribe molnupiravir antiviral.  Also prescribing Flonase for nasal congestion and benzonatate for cough.  If getting symptoms of secondary infection such as increasing chest congestion or sinus pressure was noted.  For more productive cough did go ahead and place chest x-ray order. Can get that done at the med center in the event that does occur.  Patient expressed understanding.  Follow-up in 5 to 7 days or sooner if needed.  Encouraged and asked patient to give [place] an update later in the week as to how he is doing.  If significant worsening signs /symptoms then advised emergency department evaluation.

## 2021-04-16 ENCOUNTER — Telehealth (HOSPITAL_COMMUNITY): Payer: Self-pay | Admitting: *Deleted

## 2021-04-16 NOTE — Telephone Encounter (Signed)
Received referral to enroll patient in the cardiac rehabilitation maintenance program for exercise. Called patient for scheduling. No answer, message left on patient's voicemail.

## 2021-04-18 ENCOUNTER — Inpatient Hospital Stay (HOSPITAL_BASED_OUTPATIENT_CLINIC_OR_DEPARTMENT_OTHER): Payer: Medicare Other | Admitting: Hematology & Oncology

## 2021-04-18 ENCOUNTER — Telehealth (HOSPITAL_COMMUNITY): Payer: Self-pay | Admitting: Family Medicine

## 2021-04-18 ENCOUNTER — Other Ambulatory Visit: Payer: Self-pay

## 2021-04-18 ENCOUNTER — Inpatient Hospital Stay: Payer: Medicare Other

## 2021-04-18 ENCOUNTER — Inpatient Hospital Stay: Payer: Medicare Other | Attending: Hematology & Oncology

## 2021-04-18 ENCOUNTER — Encounter: Payer: Self-pay | Admitting: Hematology & Oncology

## 2021-04-18 ENCOUNTER — Telehealth: Payer: Self-pay | Admitting: *Deleted

## 2021-04-18 VITALS — BP 160/50 | HR 60 | Temp 97.8°F | Resp 18 | Wt 150.0 lb

## 2021-04-18 DIAGNOSIS — D631 Anemia in chronic kidney disease: Secondary | ICD-10-CM | POA: Insufficient documentation

## 2021-04-18 DIAGNOSIS — D500 Iron deficiency anemia secondary to blood loss (chronic): Secondary | ICD-10-CM

## 2021-04-18 DIAGNOSIS — N183 Chronic kidney disease, stage 3 unspecified: Secondary | ICD-10-CM | POA: Insufficient documentation

## 2021-04-18 DIAGNOSIS — D508 Other iron deficiency anemias: Secondary | ICD-10-CM

## 2021-04-18 DIAGNOSIS — D509 Iron deficiency anemia, unspecified: Secondary | ICD-10-CM

## 2021-04-18 LAB — CBC WITH DIFFERENTIAL (CANCER CENTER ONLY)
Abs Immature Granulocytes: 0.07 10*3/uL (ref 0.00–0.07)
Basophils Absolute: 0 10*3/uL (ref 0.0–0.1)
Basophils Relative: 0 %
Eosinophils Absolute: 0.1 10*3/uL (ref 0.0–0.5)
Eosinophils Relative: 2 %
HCT: 30.7 % — ABNORMAL LOW (ref 39.0–52.0)
Hemoglobin: 10.4 g/dL — ABNORMAL LOW (ref 13.0–17.0)
Immature Granulocytes: 1 %
Lymphocytes Relative: 27 %
Lymphs Abs: 1.7 10*3/uL (ref 0.7–4.0)
MCH: 29.8 pg (ref 26.0–34.0)
MCHC: 33.9 g/dL (ref 30.0–36.0)
MCV: 88 fL (ref 80.0–100.0)
Monocytes Absolute: 0.5 10*3/uL (ref 0.1–1.0)
Monocytes Relative: 8 %
Neutro Abs: 4 10*3/uL (ref 1.7–7.7)
Neutrophils Relative %: 62 %
Platelet Count: 132 10*3/uL — ABNORMAL LOW (ref 150–400)
RBC: 3.49 MIL/uL — ABNORMAL LOW (ref 4.22–5.81)
RDW: 15.9 % — ABNORMAL HIGH (ref 11.5–15.5)
WBC Count: 6.5 10*3/uL (ref 4.0–10.5)
nRBC: 0 % (ref 0.0–0.2)

## 2021-04-18 LAB — CMP (CANCER CENTER ONLY)
ALT: 26 U/L (ref 0–44)
AST: 27 U/L (ref 15–41)
Albumin: 3.8 g/dL (ref 3.5–5.0)
Alkaline Phosphatase: 74 U/L (ref 38–126)
Anion gap: 8 (ref 5–15)
BUN: 50 mg/dL — ABNORMAL HIGH (ref 8–23)
CO2: 26 mmol/L (ref 22–32)
Calcium: 9.2 mg/dL (ref 8.9–10.3)
Chloride: 105 mmol/L (ref 98–111)
Creatinine: 2.06 mg/dL — ABNORMAL HIGH (ref 0.61–1.24)
GFR, Estimated: 30 mL/min — ABNORMAL LOW (ref >60)
Glucose, Bld: 199 mg/dL — ABNORMAL HIGH (ref 70–99)
Potassium: 4.5 mmol/L (ref 3.5–5.1)
Sodium: 139 mmol/L (ref 135–145)
Total Bilirubin: 0.6 mg/dL (ref 0.3–1.2)
Total Protein: 6.8 g/dL (ref 6.5–8.1)

## 2021-04-18 LAB — RETICULOCYTES
Immature Retic Fract: 12.7 % (ref 2.3–15.9)
RBC.: 3.47 MIL/uL — ABNORMAL LOW (ref 4.22–5.81)
Retic Count, Absolute: 40.3 10*3/uL (ref 19.0–186.0)
Retic Ct Pct: 1.2 % (ref 0.4–3.1)

## 2021-04-18 MED ORDER — DARBEPOETIN ALFA 300 MCG/0.6ML IJ SOSY
300.0000 ug | PREFILLED_SYRINGE | Freq: Once | INTRAMUSCULAR | Status: AC
  Administered 2021-04-18: 16:00:00 300 ug via SUBCUTANEOUS
  Filled 2021-04-18: qty 0.6

## 2021-04-18 NOTE — Progress Notes (Signed)
Hematology and Oncology Follow Up Visit  [name] [id number] 11/03/1928 87 y.o. 04/18/2021   Principle Diagnosis:  Erythropoietin deficiency anemia Iron deficiency anemia  Current Therapy:   IV iron as indicated -- feraheme given on 07/27/2019 Aranesp 300 g sq prn hemoglobin < 11 - last dose given on  02/05/2021   Interim History:  [name] is here today for follow-up.  He is doing quite well.  I see him in church every Sunday.  He is doing fine.  He had a nice Thanksgiving.  He is looking forward to Christmas.  He did not require any Aranesp last on that we saw him.  He gets Aranesp about every 2 months.  He has had no problems with iron.  When we last saw him, his ferritin was 272 with an iron saturation of 33%.  He has had no problems with the bladder cancer.  He has superficial bladder cancer.  He says he thinks he sees a Dealer in a couple months.  He has had no bleeding.  His renal function is holding steady.  He has had a little bit of leg swelling.  He does use compression stockings.  Currently, I would say his performance status is probably ECOG 2.       Medications:  Allergies as of 04/18/2021       Reactions   Penicillins Hives   Has patient had a PCN reaction causing immediate rash, facial/tongue/throat swelling, SOB or lightheadedness with hypotension: No Has patient had a PCN reaction causing severe rash involving mucus membranes or skin necrosis: Yes Has patient had a PCN reaction that required hospitalization: No Has patient had a PCN reaction occurring within the last 10 years: No If all of the above answers are "NO", then may proceed with Cephalosporin use.   Vancomycin Other (See Comments)   Edema and myalgia        Medication List        Accurate as of April 18, 2021  4:10 PM. If you have any questions, ask your nurse or doctor.          allopurinol 300 MG tablet Commonly known as: ZYLOPRIM Take 0.5 tablets (150 mg  total) by mouth daily.   amLODipine 5 MG tablet Commonly known as: NORVASC Take 1 tablet (5 mg total) by mouth daily.   atorvastatin 20 MG tablet Commonly known as: LIPITOR Take 1 tablet (20 mg total) by mouth at bedtime.   benzonatate 100 MG capsule Commonly known as: TESSALON Take 1 capsule (100 mg total) by mouth 3 (three) times daily as needed for cough.   blood glucose meter kit and supplies Dispense based on patient and insurance preference. Use up to four times daily as directed. R73.03   carvedilol 6.25 MG tablet Commonly known as: COREG Take 1 tablet (6.25 mg total) by mouth 2 (two) times daily with a meal.   Cholecalciferol 125 MCG (5000 UT) capsule Take 5,000 Units by mouth daily.   CHROMIUM GTF PO Take 1 capsule by mouth every evening.   Cinnamon 500 MG Tabs Take 2 tablets by mouth at bedtime.   dicyclomine 10 MG capsule Commonly known as: BENTYL Take 1 capsule (10 mg total) by mouth 4 (four) times daily -  before meals and at bedtime.   diphenoxylate-atropine 2.5-0.025 MG tablet Commonly known as: LOMOTIL 1 tablet. Pt takes PRN   Eliquis 2.5 MG Tabs tablet Generic drug: apixaban TAKE 1 TABLET TWICE A DAY   finasteride 5 MG  tablet Commonly known as: PROSCAR Take 5 mg by mouth daily.   fish oil-omega-3 fatty acids 1000 MG capsule Take 1 g by mouth daily.   fluticasone 50 MCG/ACT nasal spray Commonly known as: FLONASE Place 2 sprays into both nostrils daily.   furosemide 40 MG tablet Commonly known as: LASIX Take 1 tablet (40 mg total) by mouth daily.   latanoprost 0.005 % ophthalmic solution Commonly known as: XALATAN Place 1 drop into both eyes at bedtime.   onetouch ultrasoft lancets Use as instructed   OneTouch Verio test strip Generic drug: glucose blood Use as instructed   potassium chloride 10 MEQ tablet Commonly known as: KLOR-CON Take 1 tablet (10 mEq total) by mouth daily.   PRESERVISION AREDS PO Take 1 tablet by mouth 2  (two) times daily.   PROBIOTIC PO Take 1 tablet by mouth daily.   tamsulosin 0.4 MG Caps capsule Commonly known as: FLOMAX Take 0.4 mg by mouth.   timolol 0.5 % ophthalmic solution Commonly known as: TIMOPTIC Place 1 drop into both eyes 2 (two) times daily.   UNABLE TO FIND Med Name: Mito Q   VANADYL SULFATE PO Take 1 capsule by mouth every evening.   zinc gluconate 50 MG tablet Take 50 mg by mouth daily.        Allergies:  Allergies  Allergen Reactions   Penicillins Hives    Has patient had a PCN reaction causing immediate rash, facial/tongue/throat swelling, SOB or lightheadedness with hypotension: No Has patient had a PCN reaction causing severe rash involving mucus membranes or skin necrosis: Yes Has patient had a PCN reaction that required hospitalization: No Has patient had a PCN reaction occurring within the last 10 years: No If all of the above answers are "NO", then may proceed with Cephalosporin use.    Vancomycin Other (See Comments)    Edema and myalgia    Past Medical History, Surgical history, Social history, and Family History were reviewed and updated.  Review of Systems: Review of Systems  Constitutional: Negative.   HENT: Negative.    Eyes: Negative.   Respiratory: Negative.    Cardiovascular:  Positive for palpitations.  Gastrointestinal: Negative.   Genitourinary: Negative.   Musculoskeletal: Negative.   Skin: Negative.   Neurological: Negative.   Endo/Heme/Allergies: Negative.   Psychiatric/Behavioral: Negative.      Physical Exam:  weight is 150 lb (68 kg). His oral temperature is 97.8 F (36.6 C). His blood pressure is 160/50 (abnormal) and his pulse is 60. His respiration is 18 and oxygen saturation is 100%.   Wt Readings from Last 3 Encounters:  04/18/21 150 lb (68 kg)  03/20/21 151 lb (68.5 kg)  02/12/21 154 lb (69.9 kg)    Physical Exam Vitals reviewed.  HENT:     Head: Normocephalic and atraumatic.  Eyes:     Pupils:  Pupils are equal, round, and reactive to light.  Cardiovascular:     Rate and Rhythm: Normal rate and regular rhythm.     Heart sounds: Normal heart sounds.  Pulmonary:     Effort: Pulmonary effort is normal.     Breath sounds: Normal breath sounds.  Abdominal:     General: Bowel sounds are normal.     Palpations: Abdomen is soft.  Musculoskeletal:        General: No tenderness or deformity. Normal range of motion.     Cervical back: Normal range of motion.  Lymphadenopathy:     Cervical: No cervical adenopathy.  Skin:  General: Skin is warm and dry.     Findings: No erythema or rash.  Neurological:     Mental Status: He is alert and oriented to person, place, and time.  Psychiatric:        Behavior: Behavior normal.        Thought Content: Thought content normal.        Judgment: Judgment normal.     Lab Results  Component Value Date   WBC 6.5 04/18/2021   HGB 10.4 (L) 04/18/2021   HCT 30.7 (L) 04/18/2021   MCV 88.0 04/18/2021   PLT 132 (L) 04/18/2021   Lab Results  Component Value Date   FERRITIN 272 03/20/2021   IRON 87 03/20/2021   TIBC 265 03/20/2021   UIBC 178 03/20/2021   IRONPCTSAT 33 03/20/2021   Lab Results  Component Value Date   RETICCTPCT 1.2 04/18/2021   RBC 3.47 (L) 04/18/2021   No results found for: KPAFRELGTCHN, LAMBDASER, KAPLAMBRATIO No results found for: Kandis Cocking, IGMSERUM Lab Results  Component Value Date   TOTALPROTELP 6.0 (L) 12/26/2015   ALBUMINELP 3.4 (L) 12/26/2015   A1GS 0.4 (H) 12/26/2015   A2GS 0.8 12/26/2015   BETS 0.4 12/26/2015   BETA2SER 0.3 12/26/2015   GAMS 0.8 12/26/2015   MSPIKE Not Observed 01/29/2016   SPEI SEE NOTE 12/26/2015     Chemistry      Component Value Date/Time   NA 139 04/18/2021 1520   NA 141 08/11/2017 0924   NA 145 04/09/2017 0905   NA 140 03/28/2016 0853   K 4.5 04/18/2021 1520   K 4.5 04/09/2017 0905   K 4.4 03/28/2016 0853   CL 105 04/18/2021 1520   CL 105 04/09/2017 0905   CO2 26  04/18/2021 1520   CO2 27 04/09/2017 0905   CO2 19 (L) 03/28/2016 0853   BUN 50 (H) 04/18/2021 1520   BUN 27 08/11/2017 0924   BUN 33 (H) 04/09/2017 0905   BUN 34.4 (H) 03/28/2016 0853   CREATININE 2.06 (H) 04/18/2021 1520   CREATININE 1.72 (H) 08/18/2017 1543   CREATININE 1.7 (H) 03/28/2016 0853   GLU 113 07/24/2016 0000      Component Value Date/Time   CALCIUM 9.2 04/18/2021 1520   CALCIUM 9.2 04/09/2017 0905   CALCIUM 9.2 03/28/2016 0853   ALKPHOS 74 04/18/2021 1520   ALKPHOS 69 04/09/2017 0905   ALKPHOS 85 03/28/2016 0853   AST 27 04/18/2021 1520   AST 23 03/28/2016 0853   ALT 26 04/18/2021 1520   ALT 27 04/09/2017 0905   ALT 37 03/28/2016 0853   BILITOT 0.6 04/18/2021 1520   BILITOT 0.79 03/28/2016 0853      Impression and Plan: [name] is a very pleasant 87 yo caucasian gentleman with both erythropoietin deficiency and iron deficiency anemia.    We will go ahead and give him Aranesp today.  Again I think every 2 months is probably going to be the interval for him.  I forgot to mention that he does exercise.  I am very impressed by this.  I will see him back in the office in February.  We will try to get him through January and any bad weather.   [name], MD 12/22/20224:10 PM

## 2021-04-18 NOTE — Telephone Encounter (Signed)
Per 04/18/21 los - gave upcoming appointments - confirmed

## 2021-04-18 NOTE — Patient Instructions (Signed)
Darbepoetin Alfa injection ?What is this medication? ?DARBEPOETIN ALFA (dar be POE e tin  AL fa) helps your body make more red blood cells. It is used to treat anemia caused by chronic kidney failure and chemotherapy. ?This medicine may be used for other purposes; ask your health care provider or pharmacist if you have questions. ?COMMON BRAND NAME(S): Aranesp ?What should I tell my care team before I take this medication? ?They need to know if you have any of these conditions: ?blood clotting disorders or history of blood clots ?cancer patient not on chemotherapy ?cystic fibrosis ?heart disease, such as angina, heart failure, or a history of a heart attack ?hemoglobin level of 12 g/dL or greater ?high blood pressure ?low levels of folate, iron, or vitamin B12 ?seizures ?an unusual or allergic reaction to darbepoetin, erythropoietin, albumin, hamster proteins, latex, other medicines, foods, dyes, or preservatives ?pregnant or trying to get pregnant ?breast-feeding ?How should I use this medication? ?This medicine is for injection into a vein or under the skin. It is usually given by a health care professional in a hospital or clinic setting. ?If you get this medicine at home, you will be taught how to prepare and give this medicine. Use exactly as directed. Take your medicine at regular intervals. Do not take your medicine more often than directed. ?It is important that you put your used needles and syringes in a special sharps container. Do not put them in a trash can. If you do not have a sharps container, call your pharmacist or healthcare provider to get one. ?A special MedGuide will be given to you by the pharmacist with each prescription and refill. Be sure to read this information carefully each time. ?Talk to your pediatrician regarding the use of this medicine in children. While this medicine may be used in children as young as 1 month of age for selected conditions, precautions do apply. ?Overdosage: If  you think you have taken too much of this medicine contact a poison control center or emergency room at once. ?NOTE: This medicine is only for you. Do not share this medicine with others. ?What if I miss a dose? ?If you miss a dose, take it as soon as you can. If it is almost time for your next dose, take only that dose. Do not take double or extra doses. ?What may interact with this medication? ?Do not take this medicine with any of the following medications: ?epoetin alfa ?This list may not describe all possible interactions. Give your health care provider a list of all the medicines, herbs, non-prescription drugs, or dietary supplements you use. Also tell them if you smoke, drink alcohol, or use illegal drugs. Some items may interact with your medicine. ?What should I watch for while using this medication? ?Your condition will be monitored carefully while you are receiving this medicine. ?You may need blood work done while you are taking this medicine. ?This medicine may cause a decrease in vitamin B6. You should make sure that you get enough vitamin B6 while you are taking this medicine. Discuss the foods you eat and the vitamins you take with your health care professional. ?What side effects may I notice from receiving this medication? ?Side effects that you should report to your doctor or health care professional as soon as possible: ?allergic reactions like skin rash, itching or hives, swelling of the face, lips, or tongue ?breathing problems ?changes in vision ?chest pain ?confusion, trouble speaking or understanding ?feeling faint or lightheaded, falls ?high blood   pressure ?muscle aches or pains ?pain, swelling, warmth in the leg ?rapid weight gain ?severe headaches ?sudden numbness or weakness of the face, arm or leg ?trouble walking, dizziness, loss of balance or coordination ?seizures (convulsions) ?swelling of the ankles, feet, hands ?unusually weak or tired ?Side effects that usually do not require  medical attention (report to your doctor or health care professional if they continue or are bothersome): ?diarrhea ?fever, chills (flu-like symptoms) ?headaches ?nausea, vomiting ?redness, stinging, or swelling at site where injected ?This list may not describe all possible side effects. Call your doctor for medical advice about side effects. You may report side effects to FDA at 1-800-FDA-1088. ?Where should I keep my medication? ?Keep out of the reach of children. ?Store in a refrigerator between 2 and 8 degrees C (36 and 46 degrees F). Do not freeze. Do not shake. Throw away any unused portion if using a single-dose vial. Throw away any unused medicine after the expiration date. ?NOTE: This sheet is a summary. It may not cover all possible information. If you have questions about this medicine, talk to your doctor, pharmacist, or health care provider. ?? 2022 [employer] Standard (2017-05-04 00:00:00) ? ?

## 2021-04-19 ENCOUNTER — Telehealth (HOSPITAL_COMMUNITY): Payer: Self-pay

## 2021-04-19 LAB — IRON AND IRON BINDING CAPACITY (CC-WL,HP ONLY)
Iron: 79 ug/dL (ref 45–182)
Saturation Ratios: 31 % (ref 17.9–39.5)
TIBC: 259 ug/dL (ref 250–450)
UIBC: 180 ug/dL (ref 117–376)

## 2021-04-19 LAB — FERRITIN: Ferritin: 504 ng/mL — ABNORMAL HIGH (ref 24–336)

## 2021-04-19 NOTE — Telephone Encounter (Signed)
Pt called back and stated that he is not interested in the Cardiac rehab maintenance program right now.

## 2021-04-24 ENCOUNTER — Other Ambulatory Visit: Payer: Self-pay | Admitting: Medical

## 2021-04-30 ENCOUNTER — Ambulatory Visit (INDEPENDENT_AMBULATORY_CARE_PROVIDER_SITE_OTHER): Payer: Medicare Other

## 2021-04-30 DIAGNOSIS — I441 Atrioventricular block, second degree: Secondary | ICD-10-CM

## 2021-04-30 LAB — CUP PACEART REMOTE DEVICE CHECK
Battery Remaining Longevity: 73 mo
Battery Remaining Percentage: 63 %
Battery Voltage: 2.99 V
Brady Statistic RV Percent Paced: 99 %
Date Time Interrogation Session: 20230103034031
Implantable Lead Implant Date: 20190402
Implantable Lead Implant Date: 20190402
Implantable Lead Location: 753859
Implantable Lead Location: 753860
Implantable Pulse Generator Implant Date: 20190402
Lead Channel Impedance Value: 460 Ohm
Lead Channel Pacing Threshold Amplitude: 0.75 V
Lead Channel Pacing Threshold Pulse Width: 0.5 ms
Lead Channel Sensing Intrinsic Amplitude: 12 mV
Lead Channel Setting Pacing Amplitude: 2.5 V
Lead Channel Setting Pacing Pulse Width: 0.5 ms
Lead Channel Setting Sensing Sensitivity: 2 mV
Pulse Gen Model: 2272
Pulse Gen Serial Number: 9003454

## 2021-05-10 NOTE — Progress Notes (Signed)
Remote pacemaker transmission.   

## 2021-05-14 DIAGNOSIS — H353134 Nonexudative age-related macular degeneration, bilateral, advanced atrophic with subfoveal involvement: Secondary | ICD-10-CM | POA: Diagnosis not present

## 2021-05-14 DIAGNOSIS — H35423 Microcystoid degeneration of retina, bilateral: Secondary | ICD-10-CM | POA: Diagnosis not present

## 2021-05-14 DIAGNOSIS — H43813 Vitreous degeneration, bilateral: Secondary | ICD-10-CM | POA: Diagnosis not present

## 2021-05-14 DIAGNOSIS — H35373 Puckering of macula, bilateral: Secondary | ICD-10-CM | POA: Diagnosis not present

## 2021-05-17 ENCOUNTER — Other Ambulatory Visit: Payer: Self-pay | Admitting: Family Medicine

## 2021-05-21 DIAGNOSIS — I129 Hypertensive chronic kidney disease with stage 1 through stage 4 chronic kidney disease, or unspecified chronic kidney disease: Secondary | ICD-10-CM | POA: Diagnosis not present

## 2021-05-21 DIAGNOSIS — D472 Monoclonal gammopathy: Secondary | ICD-10-CM | POA: Diagnosis not present

## 2021-05-21 DIAGNOSIS — N183 Chronic kidney disease, stage 3 unspecified: Secondary | ICD-10-CM | POA: Diagnosis not present

## 2021-05-21 DIAGNOSIS — I4891 Unspecified atrial fibrillation: Secondary | ICD-10-CM | POA: Diagnosis not present

## 2021-05-27 DIAGNOSIS — R0781 Pleurodynia: Secondary | ICD-10-CM | POA: Diagnosis not present

## 2021-05-31 ENCOUNTER — Inpatient Hospital Stay (HOSPITAL_BASED_OUTPATIENT_CLINIC_OR_DEPARTMENT_OTHER): Payer: Medicare Other | Admitting: Hematology & Oncology

## 2021-05-31 ENCOUNTER — Inpatient Hospital Stay: Payer: Medicare Other

## 2021-05-31 ENCOUNTER — Other Ambulatory Visit: Payer: Self-pay

## 2021-05-31 ENCOUNTER — Inpatient Hospital Stay: Payer: Medicare Other | Attending: Hematology & Oncology

## 2021-05-31 ENCOUNTER — Encounter: Payer: Self-pay | Admitting: Hematology & Oncology

## 2021-05-31 VITALS — BP 153/57 | HR 67 | Temp 98.2°F | Resp 18 | Wt 151.8 lb

## 2021-05-31 DIAGNOSIS — Z79899 Other long term (current) drug therapy: Secondary | ICD-10-CM | POA: Diagnosis not present

## 2021-05-31 DIAGNOSIS — D500 Iron deficiency anemia secondary to blood loss (chronic): Secondary | ICD-10-CM

## 2021-05-31 DIAGNOSIS — Z85528 Personal history of other malignant neoplasm of kidney: Secondary | ICD-10-CM | POA: Diagnosis not present

## 2021-05-31 DIAGNOSIS — C673 Malignant neoplasm of anterior wall of bladder: Secondary | ICD-10-CM

## 2021-05-31 DIAGNOSIS — D631 Anemia in chronic kidney disease: Secondary | ICD-10-CM | POA: Insufficient documentation

## 2021-05-31 DIAGNOSIS — N183 Chronic kidney disease, stage 3 unspecified: Secondary | ICD-10-CM | POA: Diagnosis not present

## 2021-05-31 DIAGNOSIS — D509 Iron deficiency anemia, unspecified: Secondary | ICD-10-CM | POA: Insufficient documentation

## 2021-05-31 LAB — CBC WITH DIFFERENTIAL (CANCER CENTER ONLY)
Abs Immature Granulocytes: 0.03 10*3/uL (ref 0.00–0.07)
Basophils Absolute: 0 10*3/uL (ref 0.0–0.1)
Basophils Relative: 0 %
Eosinophils Absolute: 0.2 10*3/uL (ref 0.0–0.5)
Eosinophils Relative: 3 %
HCT: 35.4 % — ABNORMAL LOW (ref 39.0–52.0)
Hemoglobin: 11.9 g/dL — ABNORMAL LOW (ref 13.0–17.0)
Immature Granulocytes: 0 %
Lymphocytes Relative: 26 %
Lymphs Abs: 1.8 10*3/uL (ref 0.7–4.0)
MCH: 31.1 pg (ref 26.0–34.0)
MCHC: 33.6 g/dL (ref 30.0–36.0)
MCV: 92.4 fL (ref 80.0–100.0)
Monocytes Absolute: 0.7 10*3/uL (ref 0.1–1.0)
Monocytes Relative: 10 %
Neutro Abs: 4.2 10*3/uL (ref 1.7–7.7)
Neutrophils Relative %: 61 %
Platelet Count: 145 10*3/uL — ABNORMAL LOW (ref 150–400)
RBC: 3.83 MIL/uL — ABNORMAL LOW (ref 4.22–5.81)
RDW: 16 % — ABNORMAL HIGH (ref 11.5–15.5)
WBC Count: 7.1 10*3/uL (ref 4.0–10.5)
nRBC: 0 % (ref 0.0–0.2)

## 2021-05-31 LAB — CMP (CANCER CENTER ONLY)
ALT: 19 U/L (ref 0–44)
AST: 22 U/L (ref 15–41)
Albumin: 4.2 g/dL (ref 3.5–5.0)
Alkaline Phosphatase: 79 U/L (ref 38–126)
Anion gap: 7 (ref 5–15)
BUN: 39 mg/dL — ABNORMAL HIGH (ref 8–23)
CO2: 27 mmol/L (ref 22–32)
Calcium: 9.7 mg/dL (ref 8.9–10.3)
Chloride: 103 mmol/L (ref 98–111)
Creatinine: 1.83 mg/dL — ABNORMAL HIGH (ref 0.61–1.24)
GFR, Estimated: 35 mL/min — ABNORMAL LOW (ref >60)
Glucose, Bld: 111 mg/dL — ABNORMAL HIGH (ref 70–99)
Potassium: 4.7 mmol/L (ref 3.5–5.1)
Sodium: 137 mmol/L (ref 135–145)
Total Bilirubin: 0.6 mg/dL (ref 0.3–1.2)
Total Protein: 7 g/dL (ref 6.5–8.1)

## 2021-05-31 LAB — RETICULOCYTES
Immature Retic Fract: 10.9 % (ref 2.3–15.9)
RBC.: 3.77 MIL/uL — ABNORMAL LOW (ref 4.22–5.81)
Retic Count, Absolute: 38.8 10*3/uL (ref 19.0–186.0)
Retic Ct Pct: 1 % (ref 0.4–3.1)

## 2021-05-31 NOTE — Progress Notes (Signed)
Hematology and Oncology Follow Up Visit  [name] [id number] 12-May-1928 88 y.o. 05/31/2021   Principle Diagnosis:  Erythropoietin deficiency anemia Iron deficiency anemia  Current Therapy:   IV iron as indicated -- feraheme given on 07/27/2019 Aranesp 300 g sq prn hemoglobin < 11 - last dose given on  02/05/2021   Interim History:  [name] is here today for follow-up.  He is doing quite well.  I see him in church every Sunday.  He fell a couple weeks ago.  Thankfully, he did not break anything.  He had a cut over the bridge of his nose.  He is doing well with his kidneys.  He did have bladder cancer.  This was a superficial bladder cancer that was treated with intravesicular therapy.  His appetite is doing okay.  He is having no issues with nausea or vomiting.  He is having no problems with bleeding outside of that when he fell.  His iron studies over last saw him showed iron saturation of 31%.  Overall, I would say his performance status is ECOG 2.    Medications:  Allergies as of 05/31/2021       Reactions   Penicillins Hives   Has patient had a PCN reaction causing immediate rash, facial/tongue/throat swelling, SOB or lightheadedness with hypotension: No Has patient had a PCN reaction causing severe rash involving mucus membranes or skin necrosis: Yes Has patient had a PCN reaction that required hospitalization: No Has patient had a PCN reaction occurring within the last 10 years: No If all of the above answers are "NO", then may proceed with Cephalosporin use.   Vancomycin Other (See Comments)   Edema and myalgia        Medication List        Accurate as of May 31, 2021  2:06 PM. If you have any questions, ask your nurse or doctor.          allopurinol 300 MG tablet Commonly known as: ZYLOPRIM Take 0.5 tablets (150 mg total) by mouth daily.   amLODipine 5 MG tablet Commonly known as: NORVASC Take 1 tablet (5 mg total) by mouth daily.    atorvastatin 20 MG tablet Commonly known as: LIPITOR Take 1 tablet (20 mg total) by mouth at bedtime.   benzonatate 100 MG capsule Commonly known as: TESSALON Take 1 capsule (100 mg total) by mouth 3 (three) times daily as needed for cough.   blood glucose meter kit and supplies Dispense based on patient and insurance preference. Use up to four times daily as directed. R73.03   carvedilol 6.25 MG tablet Commonly known as: COREG Take 1 tablet (6.25 mg total) by mouth 2 (two) times daily with a meal.   Cholecalciferol 125 MCG (5000 UT) capsule Take 5,000 Units by mouth daily.   CHROMIUM GTF PO Take 1 capsule by mouth every evening.   Cinnamon 500 MG Tabs Take 2 tablets by mouth at bedtime.   dicyclomine 10 MG capsule Commonly known as: BENTYL Take 1 capsule (10 mg total) by mouth 4 (four) times daily -  before meals and at bedtime.   diphenoxylate-atropine 2.5-0.025 MG tablet Commonly known as: LOMOTIL 1 tablet. Pt takes PRN   Eliquis 2.5 MG Tabs tablet Generic drug: apixaban TAKE 1 TABLET TWICE A DAY   finasteride 5 MG tablet Commonly known as: PROSCAR Take 5 mg by mouth daily.   fish oil-omega-3 fatty acids 1000 MG capsule Take 1 g by mouth daily.   fluticasone 50  MCG/ACT nasal spray Commonly known as: FLONASE SPRAY 2 SPRAYS INTO EACH NOSTRIL EVERY DAY   furosemide 40 MG tablet Commonly known as: LASIX Take 1 tablet (40 mg total) by mouth daily.   latanoprost 0.005 % ophthalmic solution Commonly known as: XALATAN Place 1 drop into both eyes at bedtime.   onetouch ultrasoft lancets Use as instructed   OneTouch Verio test strip Generic drug: glucose blood Use as instructed   potassium chloride 10 MEQ tablet Commonly known as: KLOR-CON Take 1 tablet (10 mEq total) by mouth daily.   PRESERVISION AREDS PO Take 1 tablet by mouth 2 (two) times daily.   PROBIOTIC PO Take 1 tablet by mouth daily.   tamsulosin 0.4 MG Caps capsule Commonly known as:  FLOMAX Take 0.4 mg by mouth.   timolol 0.5 % ophthalmic solution Commonly known as: TIMOPTIC Place 1 drop into both eyes 2 (two) times daily.   UNABLE TO FIND Med Name: Mito Q   VANADYL SULFATE PO Take 1 capsule by mouth every evening.   zinc gluconate 50 MG tablet Take 50 mg by mouth daily.        Allergies:  Allergies  Allergen Reactions   Penicillins Hives    Has patient had a PCN reaction causing immediate rash, facial/tongue/throat swelling, SOB or lightheadedness with hypotension: No Has patient had a PCN reaction causing severe rash involving mucus membranes or skin necrosis: Yes Has patient had a PCN reaction that required hospitalization: No Has patient had a PCN reaction occurring within the last 10 years: No If all of the above answers are "NO", then may proceed with Cephalosporin use.    Vancomycin Other (See Comments)    Edema and myalgia    Past Medical History, Surgical history, Social history, and Family History were reviewed and updated.  Review of Systems: Review of Systems  Constitutional: Negative.   HENT: Negative.    Eyes: Negative.   Respiratory: Negative.    Cardiovascular:  Positive for palpitations.  Gastrointestinal: Negative.   Genitourinary: Negative.   Musculoskeletal: Negative.   Skin: Negative.   Neurological: Negative.   Endo/Heme/Allergies: Negative.   Psychiatric/Behavioral: Negative.      Physical Exam:  weight is 151 lb 12.8 oz (68.9 kg). His oral temperature is 98.2 F (36.8 C). His blood pressure is 153/57 (abnormal) and his pulse is 67. His respiration is 18.   Wt Readings from Last 3 Encounters:  05/31/21 151 lb 12.8 oz (68.9 kg)  04/18/21 150 lb (68 kg)  03/20/21 151 lb (68.5 kg)    Physical Exam Vitals reviewed.  HENT:     Head: Normocephalic and atraumatic.  Eyes:     Pupils: Pupils are equal, round, and reactive to light.  Cardiovascular:     Rate and Rhythm: Normal rate and regular rhythm.     Heart  sounds: Normal heart sounds.  Pulmonary:     Effort: Pulmonary effort is normal.     Breath sounds: Normal breath sounds.  Abdominal:     General: Bowel sounds are normal.     Palpations: Abdomen is soft.  Musculoskeletal:        General: No tenderness or deformity. Normal range of motion.     Cervical back: Normal range of motion.  Lymphadenopathy:     Cervical: No cervical adenopathy.  Skin:    General: Skin is warm and dry.     Findings: No erythema or rash.  Neurological:     Mental Status: He is alert and  oriented to person, place, and time.  Psychiatric:        Behavior: Behavior normal.        Thought Content: Thought content normal.        Judgment: Judgment normal.     Lab Results  Component Value Date   WBC 7.1 05/31/2021   HGB 11.9 (L) 05/31/2021   HCT 35.4 (L) 05/31/2021   MCV 92.4 05/31/2021   PLT 145 (L) 05/31/2021   Lab Results  Component Value Date   FERRITIN 504 (H) 04/18/2021   IRON 79 04/18/2021   TIBC 259 04/18/2021   UIBC 180 04/18/2021   IRONPCTSAT 31 04/18/2021   Lab Results  Component Value Date   RETICCTPCT 1.0 05/31/2021   RBC 3.77 (L) 05/31/2021   No results found for: KPAFRELGTCHN, LAMBDASER, KAPLAMBRATIO No results found for: Kandis Cocking, IGMSERUM Lab Results  Component Value Date   TOTALPROTELP 6.0 (L) 12/26/2015   ALBUMINELP 3.4 (L) 12/26/2015   A1GS 0.4 (H) 12/26/2015   A2GS 0.8 12/26/2015   BETS 0.4 12/26/2015   BETA2SER 0.3 12/26/2015   GAMS 0.8 12/26/2015   MSPIKE Not Observed 01/29/2016   SPEI SEE NOTE 12/26/2015     Chemistry      Component Value Date/Time   NA 139 04/18/2021 1520   NA 141 08/11/2017 0924   NA 145 04/09/2017 0905   NA 140 03/28/2016 0853   K 4.5 04/18/2021 1520   K 4.5 04/09/2017 0905   K 4.4 03/28/2016 0853   CL 105 04/18/2021 1520   CL 105 04/09/2017 0905   CO2 26 04/18/2021 1520   CO2 27 04/09/2017 0905   CO2 19 (L) 03/28/2016 0853   BUN 50 (H) 04/18/2021 1520   BUN 27 08/11/2017 0924    BUN 33 (H) 04/09/2017 0905   BUN 34.4 (H) 03/28/2016 0853   CREATININE 2.06 (H) 04/18/2021 1520   CREATININE 1.72 (H) 08/18/2017 1543   CREATININE 1.7 (H) 03/28/2016 0853   GLU 113 07/24/2016 0000      Component Value Date/Time   CALCIUM 9.2 04/18/2021 1520   CALCIUM 9.2 04/09/2017 0905   CALCIUM 9.2 03/28/2016 0853   ALKPHOS 74 04/18/2021 1520   ALKPHOS 69 04/09/2017 0905   ALKPHOS 85 03/28/2016 0853   AST 27 04/18/2021 1520   AST 23 03/28/2016 0853   ALT 26 04/18/2021 1520   ALT 27 04/09/2017 0905   ALT 37 03/28/2016 0853   BILITOT 0.6 04/18/2021 1520   BILITOT 0.79 03/28/2016 0853      Impression and Plan: [name] is a very pleasant 88 yo caucasian gentleman with both erythropoietin deficiency and iron deficiency anemia.    He clearly does not need any Aranesp today.  We will see what his iron studies look like.  I would have to think that they are going to be okay.  Again, I will see him in church on Sundays.  If there are any issues, he will definitely let me know.  We will try to move his appointments out a little bit more.  We will see about getting him back in 2 months.    [name], MD 2/3/20232:06 PM

## 2021-06-03 LAB — FERRITIN: Ferritin: 318 ng/mL (ref 24–336)

## 2021-06-03 LAB — IRON AND IRON BINDING CAPACITY (CC-WL,HP ONLY)
Iron: 111 ug/dL (ref 45–182)
Saturation Ratios: 35 % (ref 17.9–39.5)
TIBC: 314 ug/dL (ref 250–450)
UIBC: 203 ug/dL (ref 117–376)

## 2021-06-10 DIAGNOSIS — L821 Other seborrheic keratosis: Secondary | ICD-10-CM | POA: Diagnosis not present

## 2021-06-10 DIAGNOSIS — Z85828 Personal history of other malignant neoplasm of skin: Secondary | ICD-10-CM | POA: Diagnosis not present

## 2021-06-10 DIAGNOSIS — D485 Neoplasm of uncertain behavior of skin: Secondary | ICD-10-CM | POA: Diagnosis not present

## 2021-06-10 DIAGNOSIS — L570 Actinic keratosis: Secondary | ICD-10-CM | POA: Diagnosis not present

## 2021-06-10 DIAGNOSIS — C44629 Squamous cell carcinoma of skin of left upper limb, including shoulder: Secondary | ICD-10-CM | POA: Diagnosis not present

## 2021-06-11 ENCOUNTER — Encounter: Payer: Medicare Other | Admitting: Student

## 2021-06-13 ENCOUNTER — Encounter: Payer: Self-pay | Admitting: Cardiovascular Disease

## 2021-06-13 ENCOUNTER — Ambulatory Visit (INDEPENDENT_AMBULATORY_CARE_PROVIDER_SITE_OTHER): Payer: Medicare Other | Admitting: Cardiovascular Disease

## 2021-06-13 ENCOUNTER — Other Ambulatory Visit: Payer: Self-pay

## 2021-06-13 VITALS — BP 162/60 | HR 60 | Ht 68.0 in | Wt 153.4 lb

## 2021-06-13 DIAGNOSIS — I5022 Chronic systolic (congestive) heart failure: Secondary | ICD-10-CM | POA: Diagnosis not present

## 2021-06-13 DIAGNOSIS — I482 Chronic atrial fibrillation, unspecified: Secondary | ICD-10-CM | POA: Diagnosis not present

## 2021-06-13 DIAGNOSIS — Z950 Presence of cardiac pacemaker: Secondary | ICD-10-CM | POA: Diagnosis not present

## 2021-06-13 DIAGNOSIS — Z951 Presence of aortocoronary bypass graft: Secondary | ICD-10-CM | POA: Diagnosis not present

## 2021-06-13 NOTE — Progress Notes (Signed)
CARDIOLOGY CONSULT NOTE       Patient ID: [name] MRN: [id number] DOB/AGE: 08/28/1928 88 y.o.   Primary Physician: [name], DO Primary Cardiologist: Johnsie Cancel Primary EP:  Allred   HPI:  88 y.o. followed mostly by Dr Rayann Heman. Post St Jude PPM for CHB in 2019 PPM is MRI safe. He is chronically anticoagulated low dose due to CRF for PAF. Failed multiple previous cardioversion's  Distant history of CABG in 1999 LIMA to LAD, SVG to IM, Sequenced SVG OM1OM2, SVG to D1 and SVG to PDA. Grafts patent by cath in 2011 and non ischemic myovue in 2017 with EF 70%  Echo in 2018 with moderate MR   Sees Ennever for anemia has had Aranesp and iron infusions Reviewed PACEART from 04/30/21 normal function   Seen in ED 11/12/20 more dyspnea PND and edema Normally takes lasix 20 mg daily BNP very  Elevated 1533 and CXR consistent with CHF Rx iv lasix Advised admission but patient wanted To go home Cr elevated 1.93 and K 4.5 , Hct 29.9   ECG:  afib with V pacing rate 60   Prior to admission was not taking lasix daily just as needed Since ER 40 mg daily BNP 1436 7.22.22 BUN/Cr 34/1.73 12/06/20  TTE: 11/20/20 EF 40-45% severe bi-atrial enlargement moderate MR small PFO Aortic root 3.8   Son with him today and daughter nearby He has been married for 101 years   Discussed fact that EF worse, MR , afib all contribute to CHF. Given age, renal failure and lack of   Chest pain would not cath.  Having heaviness in both legs worried about claudication However [place] LEls 12/24/20 showed no significant obstructive PVD   Tested positive for COVID 04/02/21 Fortunately mild symptoms Has had vaccines x 3 Mostly nasal congestion   Fell in back yard clearing a drain and fell on left side ER visit with no broken bones Ribs healed feel better now     ROS All other systems reviewed and negative except as noted above  Past Medical History:  Diagnosis Date   A-fib ([place])    Basal cell carcinoma    skin    Bigeminal rhythm    Bradycardia 06/21/2017   Cancer of anterior wall of urinary bladder ([place]) 07/18/2019   Cataract    Chronic renal insufficiency, stage 3 (moderate) ([place]) 2018   GFR 30s-40s   Coronary artery disease    post bypass   CVD (cardiovascular disease)    Diabetes mellitus without complication ([place])    type 2 diet controlled   Diverticular disease    Easy bruising    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   Gout    Hematuria 06/30/2019   Hernia    Hyperkalemia 05/06/2017   Hyperlipidemia    Hypertension    IBS (irritable bowel syndrome)    Iron deficiency anemia 02/04/2016   Macular degeneration    Microscopic colitis    PVD (peripheral vascular disease) ([place])     Family History  Problem Relation Age of Onset   Stroke Mother    Coronary artery disease Father    Heart disease Father    Melanoma Sister    Colon cancer Neg Hx     Social History   Socioeconomic History   Marital status: Married    Spouse name: Not on file   Number of children: Not on file   Years of education: Not on file   Highest education level: Not on file  Occupational History   Occupation: retired  Tobacco Use   Smoking status: Former   Smokeless tobacco: Former    Types: Chew    Quit date: 02/03/1969  Vaping Use   Vaping Use: Never used  Substance and Sexual Activity   Alcohol use: No    Alcohol/week: 0.0 standard drinks   Drug use: No   Sexual activity: Never  Other Topics Concern   Not on file  Social History Narrative   Married to [place], 2 children [name] and [name].   Some college. Retired from [employer].   Drinks caffeine, uses herbal remedies, takes a daily vitamin.   Wears his seatbelt, smoke detector at home, firearms in the home.   Wears a hearing aid.   Feels safe in her relationships.   Social Determinants of Health   Financial Resource Strain: Not on file  Food Insecurity: Not on file  Transportation Needs: Not on file  Physical Activity: Not on file   Stress: Not on file  Social Connections: Not on file  Intimate Partner Violence: Not on file    Past Surgical History:  Procedure Laterality Date   CARDIAC CATHETERIZATION  2006   CARDIOVERSION N/A 02/22/2013   Procedure: CARDIOVERSION;  Surgeon: [name], MD;  Location: [place];  Service: Cardiovascular;  Laterality: N/A;   CARDIOVERSION N/A 11/15/2014   Procedure: CARDIOVERSION;  Surgeon: [name], MD;  Location: [place];  Service: Cardiovascular;  Laterality: N/A;   CARDIOVERSION N/A 04/01/2017   Procedure: CARDIOVERSION;  Surgeon: Larey Dresser, MD;  Location: [place];  Service: Cardiovascular;  Laterality: N/A;   CAROTID ENDARTERECTOMY  2009/ 1993   left/ right   COLONOSCOPY  03/17/2005   The colon is normal.   CORONARY ARTERY BYPASS GRAFT  1999   CYSTOSCOPY W/ RETROGRADES Bilateral 07/14/2019   Procedure: CYSTOSCOPY WITH RETROGRADE PYELOGRAM;  Surgeon: Franchot Gallo, MD;  [facility];  Service: Urology;  Laterality: Bilateral;   ESOPHAGOGASTRODUODENOSCOPY  03/17/2005   Normal esophagus. Normal Stomanch. Normal duodenum.    EYE SURGERY     eyelid repair   INGUINAL HERNIA REPAIR  02/11/2012   Procedure: LAPAROSCOPIC BILATERAL INGUINAL HERNIA REPAIR;  Surgeon: Pedro Earls, MD;  [facility];  Service: General;  Laterality: Bilateral;   PACEMAKER IMPLANT N/A 07/28/2017   St Jude Medical Assurity MRI conditional  dual-chamber pacemaker for symptomatic second degree AV block by Dr Rayann Heman   SKIN CANCER EXCISION     right ear x 3   TRANSURETHRAL RESECTION OF BLADDER TUMOR N/A 09/05/2019   Procedure: TRANSURETHRAL RESECTION OF BLADDER TUMOR (TURBT);  Surgeon: Franchot Gallo, MD;  Location[facility];  Service: Urology;  Laterality: N/A;   TRANSURETHRAL RESECTION OF BLADDER TUMOR WITH MITOMYCIN-C N/A 07/14/2019   Procedure: TRANSURETHRAL RESECTION OF BLADDER TUMOR WITH GEMCITABINE IN PACU;  Surgeon:  Franchot Gallo, MD;  [facility];  Service: Urology;  Laterality: N/A;      Current Outpatient Medications:    allopurinol (ZYLOPRIM) 300 MG tablet, Take 0.5 tablets (150 mg total) by mouth daily., Disp: 45 tablet, Rfl: 3   amLODipine (NORVASC) 5 MG tablet, Take 1 tablet (5 mg total) by mouth daily., Disp: 90 tablet, Rfl: 1   apixaban (ELIQUIS) 2.5 MG TABS tablet, TAKE 1 TABLET TWICE A DAY, Disp: 180 tablet, Rfl: 1   atorvastatin (LIPITOR) 20 MG tablet, Take 1 tablet (20 mg total) by mouth at bedtime., Disp: 90 tablet, Rfl: 3  benzonatate (TESSALON) 100 MG capsule, Take 1 capsule (100 mg total) by mouth 3 (three) times daily as needed for cough., Disp: 30 capsule, Rfl: 0   blood glucose meter kit and supplies, Dispense based on patient and insurance preference. Use up to four times daily as directed. R73.03, Disp: 1 each, Rfl: 0   carvedilol (COREG) 6.25 MG tablet, Take 1 tablet (6.25 mg total) by mouth 2 (two) times daily with a meal., Disp: 180 tablet, Rfl: 3   Cholecalciferol 125 MCG (5000 UT) capsule, Take 5,000 Units by mouth daily., Disp: , Rfl:    CHROMIUM GTF PO, Take 1 capsule by mouth every evening. , Disp: , Rfl:    Cinnamon 500 MG TABS, Take 2 tablets by mouth at bedtime. , Disp: , Rfl:    dicyclomine (BENTYL) 10 MG capsule, Take 1 capsule (10 mg total) by mouth 4 (four) times daily -  before meals and at bedtime., Disp: 360 capsule, Rfl: 5   diphenoxylate-atropine (LOMOTIL) 2.5-0.025 MG tablet, 1 tablet. Pt takes PRN, Disp: , Rfl:    finasteride (PROSCAR) 5 MG tablet, Take 5 mg by mouth daily., Disp: , Rfl: 3   fish oil-omega-3 fatty acids 1000 MG capsule, Take 1 g by mouth daily. , Disp: , Rfl:    fluticasone (FLONASE) 50 MCG/ACT nasal spray, SPRAY 2 SPRAYS INTO EACH NOSTRIL EVERY DAY, Disp: 16 mL, Rfl: 1   furosemide (LASIX) 40 MG tablet, Take 1 tablet (40 mg total) by mouth daily., Disp: 90 tablet, Rfl: 1   glucose blood (ONETOUCH VERIO) test strip,  Use as instructed, Disp: 100 each, Rfl: 12   Lancets (ONETOUCH ULTRASOFT) lancets, Use as instructed, Disp: 100 each, Rfl: 12   latanoprost (XALATAN) 0.005 % ophthalmic solution, Place 1 drop into both eyes at bedtime., Disp: , Rfl:    Multiple Vitamins-Minerals (PRESERVISION AREDS PO), Take 1 tablet by mouth 2 (two) times daily. , Disp: , Rfl:    potassium chloride (KLOR-CON) 10 MEQ tablet, Take 1 tablet (10 mEq total) by mouth daily., Disp: 90 tablet, Rfl: 3   Probiotic Product (PROBIOTIC PO), Take 1 tablet by mouth daily., Disp: , Rfl:    tamsulosin (FLOMAX) 0.4 MG CAPS capsule, Take 0.4 mg by mouth., Disp: , Rfl:    timolol (TIMOPTIC) 0.5 % ophthalmic solution, Place 1 drop into both eyes 2 (two) times daily. , Disp: , Rfl:    UNABLE TO FIND, Med Name: Mito Q, Disp: , Rfl:    VANADYL SULFATE PO, Take 1 capsule by mouth every evening. , Disp: , Rfl:    zinc gluconate 50 MG tablet, Take 50 mg by mouth daily. , Disp: , Rfl:  No current facility-administered medications for this visit.  Facility-Administered Medications Ordered in Other Visits:    gemcitabine (GEMZAR) chemo syringe for bladder instillation 2,000 mg, 2,000 mg, Bladder Instillation, Once, Franchot Gallo, MD    Physical Exam: Blood pressure (!) 162/60, pulse 60, height '5\' 8"'  (1.727 m), weight 153 lb 6.4 oz (69.6 kg), SpO2 98 %.   Elderly male JVP markedly elevated with A/V waves  Lungs clear Apical MR  JVP mildly elevated Plus one LE edema PPM under left clavicle  Palpable peripheral pulses   Labs:   Lab Results  Component Value Date   WBC 7.1 05/31/2021   HGB 11.9 (L) 05/31/2021   HCT 35.4 (L) 05/31/2021   MCV 92.4 05/31/2021   PLT 145 (L) 05/31/2021    No results for input(s): NA, K, CL, CO2,  BUN, CREATININE, CALCIUM, PROT, BILITOT, ALKPHOS, ALT, AST, GLUCOSE in the last 168 hours.  Invalid input(s): LABALBU  Lab Results  Component Value Date   CKTOTAL 42 05/25/2018   CKMB 2.3 06/16/2011   TROPONINI  <0.03 06/21/2017     Lab Results  Component Value Date   CHOL 125 11/24/2019   CHOL 110 06/21/2017   CHOL 112 06/09/2013   Lab Results  Component Value Date   HDL 57 11/24/2019   HDL 46 06/21/2017   HDL 43.20 06/09/2013   Lab Results  Component Value Date   LDLCALC 54 11/24/2019   LDLCALC 55 06/21/2017   LDLCALC 55 06/09/2013   Lab Results  Component Value Date   TRIG 67 11/24/2019   TRIG 44 06/21/2017   TRIG 69.0 06/09/2013   Lab Results  Component Value Date   CHOLHDL 2.2 11/24/2019   CHOLHDL 2.4 06/21/2017   CHOLHDL 3 06/09/2013   Lab Results  Component Value Date   LDLDIRECT 49 02/11/2021   LDLDIRECT 49 01/15/2017      Radiology: No results found.  EKG: afib with v pacing rate 60    ASSESSMENT AND PLAN:   CAD/CABG:  Distant CABG no chest pain observe Not ideal to cath given his age and renal failure  AFib:  chronic rate control fine low dose anticoagulation with age and CRF CHF:  EF 40-45% GDMT limited by age and CRF better with daily lasix RV ok no pulmonary HTN and MR stable moderate continue medical Rx PPM  normal function by PACEART 10/30/20  Anemia:  f/u Ennever iv iron Rx and aranesp Hct 30.5 12/06/20  Bladder Cancer:  no hematuria Rx intravesicular Rx March 2022 F/U [place]  Claudication:  leg pain not vascular duplex 12/24/20 no obstructive PVD    F/U 6 months     Signed: Jenkins Rouge 06/13/2021, 1:28 PM

## 2021-06-13 NOTE — Patient Instructions (Signed)
Medication Instructions:  Your physician recommends that you continue on your current medications as directed. Please refer to the Current Medication list given to you today.  *If you need a refill on your cardiac medications before your next appointment, please call your pharmacy*   Lab Work: NONE  If you have labs (blood work) drawn today and your tests are completely normal, you will receive your results only by: MyChart Message (if you have MyChart) OR A paper copy in the mail If you have any lab test that is abnormal or we need to change your treatment, we will call you to review the results.   Testing/Procedures: NONE    Follow-Up: At [facility], you and your health needs are our priority.  As part of our continuing mission to provide you with exceptional heart care, we have created designated Provider Care Teams.  These Care Teams include your primary Cardiologist (physician) and Advanced Practice Providers (APPs -  Physician Assistants and Nurse Practitioners) who all work together to provide you with the care you need, when you need it.  We recommend signing up for the patient portal called "MyChart".  Sign up information is provided on this After Visit Summary.  MyChart is used to connect with patients for Virtual Visits (Telemedicine).  Patients are able to view lab/test results, encounter notes, upcoming appointments, etc.  Non-urgent messages can be sent to your provider as well.   To learn more about what you can do with MyChart, go to [email or web address].    Your next appointment:   6 month(s)  The format for your next appointment:   In Person  Provider:   Peter Nishan, MD   Other Instructions Thank you for choosing [facility]!    

## 2021-06-17 DIAGNOSIS — I739 Peripheral vascular disease, unspecified: Secondary | ICD-10-CM | POA: Diagnosis not present

## 2021-06-17 DIAGNOSIS — E1151 Type 2 diabetes mellitus with diabetic peripheral angiopathy without gangrene: Secondary | ICD-10-CM | POA: Diagnosis not present

## 2021-06-17 DIAGNOSIS — L84 Corns and callosities: Secondary | ICD-10-CM | POA: Diagnosis not present

## 2021-06-17 DIAGNOSIS — L603 Nail dystrophy: Secondary | ICD-10-CM | POA: Diagnosis not present

## 2021-06-26 ENCOUNTER — Ambulatory Visit (INDEPENDENT_AMBULATORY_CARE_PROVIDER_SITE_OTHER): Payer: Medicare Other | Admitting: Student

## 2021-06-26 ENCOUNTER — Encounter: Payer: Self-pay | Admitting: Student

## 2021-06-26 ENCOUNTER — Other Ambulatory Visit: Payer: Self-pay

## 2021-06-26 VITALS — BP 150/56 | HR 62 | Ht 68.0 in | Wt 156.0 lb

## 2021-06-26 DIAGNOSIS — I442 Atrioventricular block, complete: Secondary | ICD-10-CM | POA: Diagnosis not present

## 2021-06-26 DIAGNOSIS — I5022 Chronic systolic (congestive) heart failure: Secondary | ICD-10-CM | POA: Diagnosis not present

## 2021-06-26 DIAGNOSIS — I482 Chronic atrial fibrillation, unspecified: Secondary | ICD-10-CM

## 2021-06-26 LAB — CUP PACEART INCLINIC DEVICE CHECK
Battery Remaining Longevity: 68 mo
Battery Voltage: 2.99 V
Brady Statistic RA Percent Paced: 0 %
Brady Statistic RV Percent Paced: 99 %
Date Time Interrogation Session: 20230301124854
Implantable Lead Implant Date: 20190402
Implantable Lead Implant Date: 20190402
Implantable Lead Location: 753859
Implantable Lead Location: 753860
Implantable Pulse Generator Implant Date: 20190402
Lead Channel Impedance Value: 462.5 Ohm
Lead Channel Pacing Threshold Amplitude: 0.5 V
Lead Channel Pacing Threshold Amplitude: 0.5 V
Lead Channel Pacing Threshold Pulse Width: 0.5 ms
Lead Channel Pacing Threshold Pulse Width: 0.5 ms
Lead Channel Sensing Intrinsic Amplitude: 0.5 mV
Lead Channel Sensing Intrinsic Amplitude: 12 mV
Lead Channel Setting Pacing Amplitude: 2.5 V
Lead Channel Setting Pacing Pulse Width: 0.5 ms
Lead Channel Setting Sensing Sensitivity: 2 mV
Pulse Gen Model: 2272
Pulse Gen Serial Number: 9003454

## 2021-06-26 NOTE — Progress Notes (Signed)
Electrophysiology Office Note Date: 06/26/2021  ID:  [name], DOB [birth year], MRN [id number]  PCP: [name], DO Primary Cardiologist: Jenkins Rouge, MD Electrophysiologist: Thompson Grayer, MD   CC: Pacemaker follow-up  [name] is a 88 y.o. male seen today for Thompson Grayer, MD for routine electrophysiology followup.  Since last being seen in our clinic the patient reports doing very well.  he denies chest pain, palpitations, dyspnea, PND, orthopnea, nausea, vomiting, dizziness, syncope, edema, weight gain, or early satiety.  Device History: STJ dual chamber PPM implanted 2019 for complete heart block   Past Medical History:  Diagnosis Date   A-fib [facility])    Basal cell carcinoma    skin   Bigeminal rhythm    Bradycardia 06/21/2017   Cancer of anterior wall of urinary bladder ([place]) 07/18/2019   Cataract    Chronic renal insufficiency, stage 3 (moderate) ([place]) 2018   GFR 30s-40s   Coronary artery disease    post bypass   CVD (cardiovascular disease)    Diabetes mellitus without complication ([place])    type 2 diet controlled   Diverticular disease    Easy bruising    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   Gout    Hematuria 06/30/2019   Hernia    Hyperkalemia 05/06/2017   Hyperlipidemia    Hypertension    IBS (irritable bowel syndrome)    Iron deficiency anemia 02/04/2016   Macular degeneration    Microscopic colitis    PVD (peripheral vascular disease) ([place])    Past Surgical History:  Procedure Laterality Date   CARDIAC CATHETERIZATION  2006   CARDIOVERSION N/A 02/22/2013   Procedure: CARDIOVERSION;  Surgeon: [name], MD;  Location: [place];  Service: Cardiovascular;  Laterality: N/A;   CARDIOVERSION N/A 11/15/2014   Procedure: CARDIOVERSION;  Surgeon: [name], MD;  Location: [place];  Service: Cardiovascular;  Laterality: N/A;   CARDIOVERSION N/A 04/01/2017   Procedure: CARDIOVERSION;  Surgeon: Larey Dresser, MD;   Location: [place];  Service: Cardiovascular;  Laterality: N/A;   CAROTID ENDARTERECTOMY  2009/ 1993   left/ right   COLONOSCOPY  03/17/2005   The colon is normal.   CORONARY ARTERY BYPASS GRAFT  1999   CYSTOSCOPY W/ RETROGRADES Bilateral 07/14/2019   Procedure: CYSTOSCOPY WITH RETROGRADE PYELOGRAM;  Surgeon: Franchot Gallo, MD;  Location[facility];  Service: Urology;  Laterality: Bilateral;   ESOPHAGOGASTRODUODENOSCOPY  03/17/2005   Normal esophagus. Normal Stomanch. Normal duodenum.    EYE SURGERY     eyelid repair   INGUINAL HERNIA REPAIR  02/11/2012   Procedure: LAPAROSCOPIC BILATERAL INGUINAL HERNIA REPAIR;  Surgeon: Pedro Earls, MD;  [facility];  Service: General;  Laterality: Bilateral;   PACEMAKER IMPLANT N/A 07/28/2017   St Jude Medical Assurity MRI conditional  dual-chamber pacemaker for symptomatic second degree AV block by Dr Rayann Heman   SKIN CANCER EXCISION     right ear x 3   TRANSURETHRAL RESECTION OF BLADDER TUMOR N/A 09/05/2019   Procedure: TRANSURETHRAL RESECTION OF BLADDER TUMOR (TURBT);  Surgeon: Franchot Gallo, MD;  Location: [facility];  Service: Urology;  Laterality: N/A;   TRANSURETHRAL RESECTION OF BLADDER TUMOR WITH MITOMYCIN-C N/A 07/14/2019   Procedure: TRANSURETHRAL RESECTION OF BLADDER TUMOR WITH GEMCITABINE IN PACU;  Surgeon: Franchot Gallo, MD;  Location: [facility];  Service: Urology;  Laterality: N/A;    Current Outpatient Medications  Medication Sig Dispense Refill   allopurinol (ZYLOPRIM) 300  MG tablet Take 0.5 tablets (150 mg total) by mouth daily. 45 tablet 3   amLODipine (NORVASC) 5 MG tablet Take 1 tablet (5 mg total) by mouth daily. 90 tablet 1   apixaban (ELIQUIS) 2.5 MG TABS tablet TAKE 1 TABLET TWICE A DAY 180 tablet 1   atorvastatin (LIPITOR) 20 MG tablet Take 1 tablet (20 mg total) by mouth at bedtime. 90 tablet 3   benzonatate (TESSALON) 100 MG capsule Take 1 capsule (100  mg total) by mouth 3 (three) times daily as needed for cough. 30 capsule 0   blood glucose meter kit and supplies Dispense based on patient and insurance preference. Use up to four times daily as directed. R73.03 1 each 0   carvedilol (COREG) 6.25 MG tablet Take 1 tablet (6.25 mg total) by mouth 2 (two) times daily with a meal. 180 tablet 3   Cholecalciferol 125 MCG (5000 UT) capsule Take 5,000 Units by mouth daily.     CHROMIUM GTF PO Take 1 capsule by mouth every evening.      Cinnamon 500 MG TABS Take 2 tablets by mouth at bedtime.      dicyclomine (BENTYL) 10 MG capsule Take 1 capsule (10 mg total) by mouth 4 (four) times daily -  before meals and at bedtime. 360 capsule 5   diphenoxylate-atropine (LOMOTIL) 2.5-0.025 MG tablet 1 tablet. Pt takes PRN     finasteride (PROSCAR) 5 MG tablet Take 5 mg by mouth daily.  3   fish oil-omega-3 fatty acids 1000 MG capsule Take 1 g by mouth daily.      fluticasone (FLONASE) 50 MCG/ACT nasal spray SPRAY 2 SPRAYS INTO EACH NOSTRIL EVERY DAY 16 mL 1   furosemide (LASIX) 40 MG tablet Take 1 tablet (40 mg total) by mouth daily. 90 tablet 1   glucose blood (ONETOUCH VERIO) test strip Use as instructed 100 each 12   Lancets (ONETOUCH ULTRASOFT) lancets Use as instructed 100 each 12   latanoprost (XALATAN) 0.005 % ophthalmic solution Place 1 drop into both eyes at bedtime.     Multiple Vitamins-Minerals (PRESERVISION AREDS PO) Take 1 tablet by mouth 2 (two) times daily.      potassium chloride (KLOR-CON) 10 MEQ tablet Take 1 tablet (10 mEq total) by mouth daily. 90 tablet 3   Probiotic Product (PROBIOTIC PO) Take 1 tablet by mouth daily.     tamsulosin (FLOMAX) 0.4 MG CAPS capsule Take 0.4 mg by mouth.     timolol (TIMOPTIC) 0.5 % ophthalmic solution Place 1 drop into both eyes 2 (two) times daily.      UNABLE TO FIND Med Name: Mito Q     VANADYL SULFATE PO Take 1 capsule by mouth every evening.      zinc gluconate 50 MG tablet Take 50 mg by mouth daily.       No current facility-administered medications for this visit.   Facility-Administered Medications Ordered in Other Visits  Medication Dose Route Frequency Provider Last Rate Last Admin   gemcitabine (GEMZAR) chemo syringe for bladder instillation 2,000 mg  2,000 mg Bladder Instillation Once Franchot Gallo, MD        Allergies:   Penicillins and Vancomycin   Social History: Social History   Socioeconomic History   Marital status: Married    Spouse name: Not on file   Number of children: Not on file   Years of education: Not on file   Highest education level: Not on file  Occupational History   Occupation: retired  Tobacco Use   Smoking status: Former   Smokeless tobacco: Former    Types: Chew    Quit date: 02/03/1969  Vaping Use   Vaping Use: Never used  Substance and Sexual Activity   Alcohol use: No    Alcohol/week: 0.0 standard drinks   Drug use: No   Sexual activity: Never  Other Topics Concern   Not on file  Social History Narrative   Married to [place], 2 children [name] and [name].   Some college. Retired from [employer].   Drinks caffeine, uses herbal remedies, takes a daily vitamin.   Wears his seatbelt, smoke detector at home, firearms in the home.   Wears a hearing aid.   Feels safe in her relationships.   Social Determinants of Health   Financial Resource Strain: Not on file  Food Insecurity: Not on file  Transportation Needs: Not on file  Physical Activity: Not on file  Stress: Not on file  Social Connections: Not on file  Intimate Partner Violence: Not on file    Family History: Family History  Problem Relation Age of Onset   Stroke Mother    Coronary artery disease Father    Heart disease Father    Melanoma Sister    Colon cancer Neg Hx      Review of Systems: All other systems reviewed and are otherwise negative except as noted above.  Physical Exam: There were no vitals filed for this visit.   GEN- The patient is well  appearing, alert and oriented x 3 today.   HEENT: normocephalic, atraumatic; sclera clear, conjunctiva pink; hearing intact; oropharynx clear; neck supple  Lungs- Clear to ausculation bilaterally, normal work of breathing.  No wheezes, rales, rhonchi Heart- Regular rate and rhythm, no murmurs, rubs or gallops  GI- soft, non-tender, non-distended, bowel sounds present  Extremities- no clubbing or cyanosis. No edema MS- no significant deformity or atrophy Skin- warm and dry, no rash or lesion; PPM pocket well healed Psych- euthymic mood, full affect Neuro- strength and sensation are intact  PPM Interrogation- reviewed in detail today,  See PACEART report  EKG:  EKG is not ordered today.  Recent Labs: 11/16/2020: B Natriuretic Peptide 1,436.0 02/11/2021: TSH 4.20 05/31/2021: ALT 19; BUN 39; Creatinine 1.83; Hemoglobin 11.9; Platelet Count 145; Potassium 4.7; Sodium 137   Wt Readings from Last 3 Encounters:  06/13/21 153 lb 6.4 oz (69.6 kg)  05/31/21 151 lb 12.8 oz (68.9 kg)  04/18/21 150 lb (68 kg)     Other studies Reviewed: Additional studies/ records that were reviewed today include: Previous EP office notes, Previous remote checks, Most recent labwork.   Assessment and Plan:  1. CHB s/p St. Jude PPM  Normal PPM function See Pace Art report No changes today  2. Persistent atrial fibrillation / atypical atrial flutter Rate control Device programmed VVIR  3. CAD Denies s/s ischemia  Current medicines are reviewed at length with the patient today.     Disposition:   Follow up with EP APP in 12 months    Signed, Annamaria Helling  06/26/2021 8:23 AM  [address] [address] [phone] (office) [phone] (fax)

## 2021-06-26 NOTE — Patient Instructions (Signed)
Medication Instructions:  ?Your physician recommends that you continue on your current medications as directed. Please refer to the Current Medication list given to you today. ? ?*If you need a refill on your cardiac medications before your next appointment, please call your pharmacy* ? ? ?Lab Work: ?None ?If you have labs (blood work) drawn today and your tests are completely normal, you will receive your results only by: ?MyChart Message (if you have MyChart) OR ?A paper copy in the mail ?If you have any lab test that is abnormal or we need to change your treatment, we will call you to review the results. ? ? ?Follow-Up: ?At [facility], you and your health needs are our priority.  As part of our continuing mission to provide you with exceptional heart care, we have created designated Provider Care Teams.  These Care Teams include your primary Cardiologist (physician) and Advanced Practice Providers (APPs -  Physician Assistants and Nurse Practitioners) who all work together to provide you with the care you need, when you need it. ? ? ?Your next appointment:   ?1 year(s) ? ?The format for your next appointment:   ?In Person ? ?Provider:   ?You may see Thompson Grayer, MD or one of the following Advanced Practice Providers on your designated Care Team:   ?Tommye Standard, PA-C ?Legrand Como "Oda Kilts, PA-C{ ?

## 2021-07-09 DIAGNOSIS — H35373 Puckering of macula, bilateral: Secondary | ICD-10-CM | POA: Diagnosis not present

## 2021-07-09 DIAGNOSIS — H35423 Microcystoid degeneration of retina, bilateral: Secondary | ICD-10-CM | POA: Diagnosis not present

## 2021-07-09 DIAGNOSIS — H353134 Nonexudative age-related macular degeneration, bilateral, advanced atrophic with subfoveal involvement: Secondary | ICD-10-CM | POA: Diagnosis not present

## 2021-07-09 DIAGNOSIS — H43813 Vitreous degeneration, bilateral: Secondary | ICD-10-CM | POA: Diagnosis not present

## 2021-07-11 DIAGNOSIS — M25552 Pain in left hip: Secondary | ICD-10-CM | POA: Diagnosis not present

## 2021-07-12 ENCOUNTER — Ambulatory Visit (INDEPENDENT_AMBULATORY_CARE_PROVIDER_SITE_OTHER): Payer: Medicare Other

## 2021-07-12 ENCOUNTER — Other Ambulatory Visit: Payer: Self-pay

## 2021-07-12 DIAGNOSIS — Z0000 Encounter for general adult medical examination without abnormal findings: Secondary | ICD-10-CM | POA: Diagnosis not present

## 2021-07-12 NOTE — Patient Instructions (Signed)

## 2021-07-12 NOTE — Progress Notes (Signed)
? ?Subjective:  ? [name] is a 88 y.o. male who presents for Medicare Annual/Subsequent preventive examination. ? ?Review of Systems    ?I connected with  [name] on 07/12/21 by an audio only telemedicine application and verified that I am speaking with the correct person using two identifiers. ?  ?I discussed the limitations, risks, security and privacy concerns of performing an evaluation and management service by telephone and the availability of in person appointments. I also discussed with the patient that there may be a patient responsible charge related to this service. The patient expressed understanding and verbally consented to this telephonic visit. ? ?Location of Patient: Home ?Location of Provider: Office ? ?List any persons and their role that are participating in the visit with the patient.  ? ?Cardiac Risk Factors include: advanced age (>46mn, >>61women) ? ?   ?Objective:  ?  ?There were no vitals filed for this visit. ?There is no height or weight on file to calculate BMI. ? ?Advanced Directives 07/12/2021 05/31/2021 04/18/2021 03/20/2021 02/05/2021 01/01/2021 12/06/2020  ?Does Patient Have a Medical Advance Directive? Yes No No No No No No  ?Type of Advance Directive Living will;Healthcare Power of Attorney Living will Living will - - - -  ?Does patient want to make changes to medical advance directive? - - - No - Patient declined No - Patient declined No - Patient declined No - Patient declined  ?Copy of [place] Chart? - No - copy requested No - copy requested - - - -  ?Would patient like information on creating a medical advance directive? No - Patient declined No - Patient declined No - Patient declined - No - Patient declined No - Patient declined -  ?Pre-existing out of facility DNR order (yellow form or pink MOST form) - - - - - - -  ? ? ?Current Medications (verified) ?Outpatient Encounter Medications as of 07/12/2021  ?Medication Sig  ? allopurinol  (ZYLOPRIM) 300 MG tablet Take 0.5 tablets (150 mg total) by mouth daily.  ? amLODipine (NORVASC) 5 MG tablet Take 1 tablet (5 mg total) by mouth daily.  ? apixaban (ELIQUIS) 2.5 MG TABS tablet TAKE 1 TABLET TWICE A DAY  ? atorvastatin (LIPITOR) 20 MG tablet Take 1 tablet (20 mg total) by mouth at bedtime.  ? benzonatate (TESSALON) 100 MG capsule Take 1 capsule (100 mg total) by mouth 3 (three) times daily as needed for cough.  ? blood glucose meter kit and supplies Dispense based on patient and insurance preference. Use up to four times daily as directed. R73.03  ? carvedilol (COREG) 6.25 MG tablet Take 1 tablet (6.25 mg total) by mouth 2 (two) times daily with a meal.  ? Cholecalciferol 125 MCG (5000 UT) capsule Take 5,000 Units by mouth daily.  ? CHROMIUM GTF PO Take 1 capsule by mouth every evening.   ? Cinnamon 500 MG TABS Take 2 tablets by mouth at bedtime.   ? dicyclomine (BENTYL) 10 MG capsule Take 1 capsule (10 mg total) by mouth 4 (four) times daily -  before meals and at bedtime.  ? diphenoxylate-atropine (LOMOTIL) 2.5-0.025 MG tablet 1 tablet. Pt takes PRN  ? finasteride (PROSCAR) 5 MG tablet Take 5 mg by mouth daily.  ? fish oil-omega-3 fatty acids 1000 MG capsule Take 1 g by mouth daily.   ? furosemide (LASIX) 40 MG tablet Take 1 tablet (40 mg total) by mouth daily.  ? glucose blood (ONETOUCH VERIO) test  strip Use as instructed  ? Lancets (ONETOUCH ULTRASOFT) lancets Use as instructed  ? latanoprost (XALATAN) 0.005 % ophthalmic solution Place 1 drop into both eyes at bedtime.  ? Multiple Vitamins-Minerals (PRESERVISION AREDS PO) Take 1 tablet by mouth 2 (two) times daily.   ? potassium chloride (KLOR-CON) 10 MEQ tablet Take 1 tablet (10 mEq total) by mouth daily.  ? Probiotic Product (PROBIOTIC PO) Take 1 tablet by mouth daily.  ? tamsulosin (FLOMAX) 0.4 MG CAPS capsule Take 0.4 mg by mouth.  ? timolol (TIMOPTIC) 0.5 % ophthalmic solution Place 1 drop into both eyes 2 (two) times daily.   ? UNABLE TO  FIND Med Name: Mito Q  ? VANADYL SULFATE PO Take 1 capsule by mouth every evening.   ? zinc gluconate 50 MG tablet Take 50 mg by mouth daily.   ? ?Facility-Administered Encounter Medications as of 07/12/2021  ?Medication  ? gemcitabine (GEMZAR) chemo syringe for bladder instillation 2,000 mg  ? ? ?Allergies (verified) ?Penicillins and Vancomycin  ? ?History: ?Past Medical History:  ?Diagnosis Date  ? A-fib ([place])   ? Basal cell carcinoma   ? skin  ? Bigeminal rhythm   ? Bradycardia 06/21/2017  ? Cancer of anterior wall of urinary bladder ([place]) 07/18/2019  ? Cataract   ? Chronic renal insufficiency, stage 3 (moderate) ([place]) 2018  ? GFR 30s-40s  ? Coronary artery disease   ? post bypass  ? CVD (cardiovascular disease)   ? Diabetes mellitus without complication ([place])   ? type 2 diet controlled  ? Diverticular disease   ? Easy bruising   ? 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  ? Gout   ? Hematuria 06/30/2019  ? Hernia   ? Hyperkalemia 05/06/2017  ? Hyperlipidemia   ? Hypertension   ? IBS (irritable bowel syndrome)   ? Iron deficiency anemia 02/04/2016  ? Macular degeneration   ? Microscopic colitis   ? PVD (peripheral vascular disease) ([place])   ? ?Past Surgical History:  ?Procedure Laterality Date  ? CARDIAC CATHETERIZATION  2006  ? CARDIOVERSION N/A 02/22/2013  ? Procedure: CARDIOVERSION;  Surgeon: [name], MD;  Location: [place];  Service: Cardiovascular;  Laterality: N/A;  ? CARDIOVERSION N/A 11/15/2014  ? Procedure: CARDIOVERSION;  Surgeon: [name], MD;  Location: [place];  Service: Cardiovascular;  Laterality: N/A;  ? CARDIOVERSION N/A 04/01/2017  ? Procedure: CARDIOVERSION;  Surgeon: Larey Dresser, MD;  Location: [facility] ENDOSCOPY;  Service: Cardiovascular;  Laterality: N/A;  ? CAROTID ENDARTERECTOMY  2009/ 1993  ? left/ right  ? COLONOSCOPY  03/17/2005  ? The colon is normal.  ? CORONARY ARTERY BYPASS GRAFT  1999  ? CYSTOSCOPY W/ RETROGRADES Bilateral 07/14/2019  ? Procedure: CYSTOSCOPY WITH  RETROGRADE PYELOGRAM;  Surgeon: Franchot Gallo, MD;  Location: [facility];  Service: Urology;  Laterality: Bilateral;  ? ESOPHAGOGASTRODUODENOSCOPY  03/17/2005  ? Normal esophagus. Normal Stomanch. Normal duodenum.   ? EYE SURGERY    ? eyelid repair  ? INGUINAL HERNIA REPAIR  02/11/2012  ? Procedure: LAPAROSCOPIC BILATERAL INGUINAL HERNIA REPAIR;  Surgeon: Pedro Earls, MD;  Location: [facility];  Service: General;  Laterality: Bilateral;  ? PACEMAKER IMPLANT N/A 07/28/2017  ? St Jude Medical Assurity MRI conditional  dual-chamber pacemaker for symptomatic second degree AV block by Dr Rayann Heman  ? SKIN CANCER EXCISION    ? right ear x 3  ? TRANSURETHRAL RESECTION OF BLADDER TUMOR N/A 09/05/2019  ? Procedure: TRANSURETHRAL RESECTION OF BLADDER TUMOR (TURBT);  Surgeon: Franchot Gallo, MD;  Location: [facility];  Service: Urology;  Laterality: N/A;  ? TRANSURETHRAL RESECTION OF BLADDER TUMOR WITH MITOMYCIN-C N/A 07/14/2019  ? Procedure: TRANSURETHRAL RESECTION OF BLADDER TUMOR WITH GEMCITABINE IN PACU;  Surgeon: Franchot Gallo, MD;  Location: [facility];  Service: Urology;  Laterality: N/A;  ? ?Family History  ?Problem Relation Age of Onset  ? Stroke Mother   ? Coronary artery disease Father   ? Heart disease Father   ? Melanoma Sister   ? Colon cancer Neg Hx   ? ?Social History  ? ?Socioeconomic History  ? Marital status: Married  ?  Spouse name: Not on file  ? Number of children: Not on file  ? Years of education: Not on file  ? Highest education level: Not on file  ?Occupational History  ? Occupation: retired  ?Tobacco Use  ? Smoking status: Former  ? Smokeless tobacco: Former  ?  Types: Chew  ?  Quit date: 02/03/1969  ?Vaping Use  ? Vaping Use: Never used  ?Substance and Sexual Activity  ? Alcohol use: No  ?  Alcohol/week: 0.0 standard drinks  ? Drug use: No  ? Sexual activity: Never  ?Other Topics Concern  ? Not on file  ?Social History Narrative  ? Married to  [place], 2 children [name] and [name].  ? Some college. Retired from [employer].  ? Drinks caffeine, uses herbal remedies, takes a daily vitamin.  ? Wears his seatbelt, smoke detector at home, firearms in the h

## 2021-07-30 ENCOUNTER — Ambulatory Visit (INDEPENDENT_AMBULATORY_CARE_PROVIDER_SITE_OTHER): Payer: Medicare Other

## 2021-07-30 DIAGNOSIS — I442 Atrioventricular block, complete: Secondary | ICD-10-CM

## 2021-07-30 LAB — CUP PACEART REMOTE DEVICE CHECK
Battery Remaining Longevity: 69 mo
Battery Remaining Percentage: 61 %
Battery Voltage: 2.99 V
Brady Statistic RV Percent Paced: 99 %
Date Time Interrogation Session: 20230404020022
Implantable Lead Implant Date: 20190402
Implantable Lead Implant Date: 20190402
Implantable Lead Location: 753859
Implantable Lead Location: 753860
Implantable Pulse Generator Implant Date: 20190402
Lead Channel Impedance Value: 410 Ohm
Lead Channel Pacing Threshold Amplitude: 0.5 V
Lead Channel Pacing Threshold Pulse Width: 0.5 ms
Lead Channel Sensing Intrinsic Amplitude: 12 mV
Lead Channel Setting Pacing Amplitude: 2.5 V
Lead Channel Setting Pacing Pulse Width: 0.5 ms
Lead Channel Setting Sensing Sensitivity: 2 mV
Pulse Gen Model: 2272
Pulse Gen Serial Number: 9003454

## 2021-08-08 ENCOUNTER — Other Ambulatory Visit: Payer: Self-pay | Admitting: Family Medicine

## 2021-08-09 ENCOUNTER — Other Ambulatory Visit: Payer: Self-pay

## 2021-08-09 ENCOUNTER — Inpatient Hospital Stay (HOSPITAL_BASED_OUTPATIENT_CLINIC_OR_DEPARTMENT_OTHER): Payer: Medicare Other | Admitting: Hematology & Oncology

## 2021-08-09 ENCOUNTER — Inpatient Hospital Stay: Payer: Medicare Other

## 2021-08-09 ENCOUNTER — Encounter: Payer: Self-pay | Admitting: Hematology & Oncology

## 2021-08-09 ENCOUNTER — Inpatient Hospital Stay: Payer: Medicare Other | Attending: Hematology & Oncology

## 2021-08-09 VITALS — BP 158/63 | HR 85 | Temp 98.1°F | Resp 18 | Wt 159.0 lb

## 2021-08-09 DIAGNOSIS — C673 Malignant neoplasm of anterior wall of bladder: Secondary | ICD-10-CM

## 2021-08-09 DIAGNOSIS — D508 Other iron deficiency anemias: Secondary | ICD-10-CM

## 2021-08-09 DIAGNOSIS — D631 Anemia in chronic kidney disease: Secondary | ICD-10-CM

## 2021-08-09 DIAGNOSIS — N183 Chronic kidney disease, stage 3 unspecified: Secondary | ICD-10-CM | POA: Insufficient documentation

## 2021-08-09 DIAGNOSIS — D509 Iron deficiency anemia, unspecified: Secondary | ICD-10-CM

## 2021-08-09 LAB — CBC WITH DIFFERENTIAL (CANCER CENTER ONLY)
Abs Immature Granulocytes: 0.01 10*3/uL (ref 0.00–0.07)
Basophils Absolute: 0 10*3/uL (ref 0.0–0.1)
Basophils Relative: 0 %
Eosinophils Absolute: 0.2 10*3/uL (ref 0.0–0.5)
Eosinophils Relative: 3 %
HCT: 29.9 % — ABNORMAL LOW (ref 39.0–52.0)
Hemoglobin: 9.9 g/dL — ABNORMAL LOW (ref 13.0–17.0)
Immature Granulocytes: 0 %
Lymphocytes Relative: 28 %
Lymphs Abs: 1.8 10*3/uL (ref 0.7–4.0)
MCH: 30.9 pg (ref 26.0–34.0)
MCHC: 33.1 g/dL (ref 30.0–36.0)
MCV: 93.4 fL (ref 80.0–100.0)
Monocytes Absolute: 0.7 10*3/uL (ref 0.1–1.0)
Monocytes Relative: 10 %
Neutro Abs: 3.7 10*3/uL (ref 1.7–7.7)
Neutrophils Relative %: 59 %
Platelet Count: 135 10*3/uL — ABNORMAL LOW (ref 150–400)
RBC: 3.2 MIL/uL — ABNORMAL LOW (ref 4.22–5.81)
RDW: 15.5 % (ref 11.5–15.5)
WBC Count: 6.3 10*3/uL (ref 4.0–10.5)
nRBC: 0 % (ref 0.0–0.2)

## 2021-08-09 LAB — CMP (CANCER CENTER ONLY)
ALT: 22 U/L (ref 0–44)
AST: 27 U/L (ref 15–41)
Albumin: 4.1 g/dL (ref 3.5–5.0)
Alkaline Phosphatase: 80 U/L (ref 38–126)
Anion gap: 8 (ref 5–15)
BUN: 42 mg/dL — ABNORMAL HIGH (ref 8–23)
CO2: 25 mmol/L (ref 22–32)
Calcium: 9.2 mg/dL (ref 8.9–10.3)
Chloride: 106 mmol/L (ref 98–111)
Creatinine: 1.87 mg/dL — ABNORMAL HIGH (ref 0.61–1.24)
GFR, Estimated: 34 mL/min — ABNORMAL LOW (ref >60)
Glucose, Bld: 103 mg/dL — ABNORMAL HIGH (ref 70–99)
Potassium: 4.4 mmol/L (ref 3.5–5.1)
Sodium: 139 mmol/L (ref 135–145)
Total Bilirubin: 0.7 mg/dL (ref 0.3–1.2)
Total Protein: 6.9 g/dL (ref 6.5–8.1)

## 2021-08-09 LAB — RETICULOCYTES
Immature Retic Fract: 15.8 % (ref 2.3–15.9)
RBC.: 3.19 MIL/uL — ABNORMAL LOW (ref 4.22–5.81)
Retic Count, Absolute: 48.8 10*3/uL (ref 19.0–186.0)
Retic Ct Pct: 1.5 % (ref 0.4–3.1)

## 2021-08-09 MED ORDER — DARBEPOETIN ALFA 300 MCG/0.6ML IJ SOSY
300.0000 ug | PREFILLED_SYRINGE | Freq: Once | INTRAMUSCULAR | Status: AC
  Administered 2021-08-09: 300 ug via SUBCUTANEOUS
  Filled 2021-08-09: qty 0.6

## 2021-08-09 NOTE — Progress Notes (Signed)
?Hematology and Oncology Follow Up Visit ? ?[name] ?[id number] ?1929-03-08 88 y.o. ?08/09/2021 ? ? ?Principle Diagnosis:  ?Erythropoietin deficiency anemia ?Iron deficiency anemia ? ?Current Therapy:   ?IV iron as indicated -- feraheme given on 07/27/2019 ?Aranesp 300 ?g sq prn hemoglobin < 11 - last dose given on  02/05/2021 ?  ?Interim History:  [name] is here today for follow-up.  He is doing quite well.  We see him at church every Sunday.  The big news is that he is going be on the [employer] up to [place] which honors are veterans.  He was chosen.  I am very impressed by this.  I think this is going to be in May. ? ?Otherwise, he is doing nicely.  His last iron studies back in February showed a ferritin of 318 with an iron saturation of 35%. ? ?He has had no problems with nausea or vomiting.  He has had no problems with fever.  He has had no obvious change in bowel or bladder habits. ? ?He does have superficial bladder cancer.  However, I do not think this is a problem for him. ? ?His appetite has been quite good. ? ?Overall, his performance status is ECOG 1. ? ?Medications:  ?Allergies as of 08/09/2021   ? ?   Reactions  ? Penicillins Hives  ? Has patient had a PCN reaction causing immediate rash, facial/tongue/throat swelling, SOB or lightheadedness with hypotension: No ?Has patient had a PCN reaction causing severe rash involving mucus membranes or skin necrosis: Yes ?Has patient had a PCN reaction that required hospitalization: No ?Has patient had a PCN reaction occurring within the last 10 years: No ?If all of the above answers are "NO", then may proceed with Cephalosporin use.  ? Vancomycin Other (See Comments)  ? Edema and myalgia  ? ?  ? ?  ?Medication List  ?  ? ?  ? Accurate as of August 09, 2021  2:00 PM. If you have any questions, ask your nurse or doctor.  ?  ?  ? ?  ? ?allopurinol 300 MG tablet ?Commonly known as: ZYLOPRIM ?Take 0.5 tablets (150 mg total) by mouth daily. ?   ?amLODipine 5 MG tablet ?Commonly known as: NORVASC ?Take 1 tablet (5 mg total) by mouth daily. ?  ?atorvastatin 20 MG tablet ?Commonly known as: LIPITOR ?Take 1 tablet (20 mg total) by mouth at bedtime. ?  ?benzonatate 100 MG capsule ?Commonly known as: TESSALON ?Take 1 capsule (100 mg total) by mouth 3 (three) times daily as needed for cough. ?  ?blood glucose meter kit and supplies ?Dispense based on patient and insurance preference. Use up to four times daily as directed. R73.03 ?  ?carvedilol 6.25 MG tablet ?Commonly known as: COREG ?Take 1 tablet (6.25 mg total) by mouth 2 (two) times daily with a meal. ?  ?Cholecalciferol 125 MCG (5000 UT) capsule ?Take 5,000 Units by mouth daily. ?  ?CHROMIUM GTF PO ?Take 1 capsule by mouth every evening. ?  ?Cinnamon 500 MG Tabs ?Take 2 tablets by mouth at bedtime. ?  ?dicyclomine 10 MG capsule ?Commonly known as: BENTYL ?Take 1 capsule (10 mg total) by mouth 4 (four) times daily -  before meals and at bedtime. ?  ?diphenoxylate-atropine 2.5-0.025 MG tablet ?Commonly known as: LOMOTIL ?1 tablet. Pt takes PRN ?  ?Eliquis 2.5 MG Tabs tablet ?Generic drug: apixaban ?TAKE 1 TABLET TWICE A DAY ?  ?finasteride 5 MG tablet ?Commonly known as: PROSCAR ?Take  5 mg by mouth daily. ?  ?fish oil-omega-3 fatty acids 1000 MG capsule ?Take 1 g by mouth daily. ?  ?furosemide 40 MG tablet ?Commonly known as: LASIX ?TAKE 1 TABLET BY MOUTH EVERY DAY ?  ?latanoprost 0.005 % ophthalmic solution ?Commonly known as: XALATAN ?Place 1 drop into both eyes at bedtime. ?  ?onetouch ultrasoft lancets ?Use as instructed ?  ?OneTouch Verio test strip ?Generic drug: glucose blood ?Use as instructed ?  ?potassium chloride 10 MEQ tablet ?Commonly known as: KLOR-CON ?Take 1 tablet (10 mEq total) by mouth daily. ?  ?PRESERVISION AREDS PO ?Take 1 tablet by mouth 2 (two) times daily. ?  ?PROBIOTIC PO ?Take 1 tablet by mouth daily. ?  ?tamsulosin 0.4 MG Caps capsule ?Commonly known as: FLOMAX ?Take 0.4 mg by  mouth. ?  ?timolol 0.5 % ophthalmic solution ?Commonly known as: TIMOPTIC ?Place 1 drop into both eyes 2 (two) times daily. ?  ?UNABLE TO FIND ?Med Name: Mito Q ?  ?VANADYL SULFATE PO ?Take 1 capsule by mouth every evening. ?  ?zinc gluconate 50 MG tablet ?Take 50 mg by mouth daily. ?  ? ?  ? ? ?Allergies:  ?Allergies  ?Allergen Reactions  ? Penicillins Hives  ?  Has patient had a PCN reaction causing immediate rash, facial/tongue/throat swelling, SOB or lightheadedness with hypotension: No ?Has patient had a PCN reaction causing severe rash involving mucus membranes or skin necrosis: Yes ?Has patient had a PCN reaction that required hospitalization: No ?Has patient had a PCN reaction occurring within the last 10 years: No ?If all of the above answers are "NO", then may proceed with Cephalosporin use. ?  ? Vancomycin Other (See Comments)  ?  Edema and myalgia  ? ? ?Past Medical History, Surgical history, Social history, and Family History were reviewed and updated. ? ?Review of Systems: ?Review of Systems  ?Constitutional: Negative.   ?HENT: Negative.    ?Eyes: Negative.   ?Respiratory: Negative.    ?Cardiovascular:  Positive for palpitations.  ?Gastrointestinal: Negative.   ?Genitourinary: Negative.   ?Musculoskeletal: Negative.   ?Skin: Negative.   ?Neurological: Negative.   ?Endo/Heme/Allergies: Negative.   ?Psychiatric/Behavioral: Negative.    ? ? ?Physical Exam: ? weight is 159 lb (72.1 kg). His oral temperature is 98.1 ?F (36.7 ?C). His blood pressure is 158/63 (abnormal) and his pulse is 85. His respiration is 18 and oxygen saturation is 97%.  ? ?Wt Readings from Last 3 Encounters:  ?08/09/21 159 lb (72.1 kg)  ?06/26/21 156 lb (70.8 kg)  ?06/13/21 153 lb 6.4 oz (69.6 kg)  ? ? ?Physical Exam ?Vitals reviewed.  ?HENT:  ?   Head: Normocephalic and atraumatic.  ?Eyes:  ?   Pupils: Pupils are equal, round, and reactive to light.  ?Cardiovascular:  ?   Rate and Rhythm: Normal rate and regular rhythm.  ?   Heart  sounds: Normal heart sounds.  ?Pulmonary:  ?   Effort: Pulmonary effort is normal.  ?   Breath sounds: Normal breath sounds.  ?Abdominal:  ?   General: Bowel sounds are normal.  ?   Palpations: Abdomen is soft.  ?Musculoskeletal:     ?   General: No tenderness or deformity. Normal range of motion.  ?   Cervical back: Normal range of motion.  ?Lymphadenopathy:  ?   Cervical: No cervical adenopathy.  ?Skin: ?   General: Skin is warm and dry.  ?   Findings: No erythema or rash.  ?Neurological:  ?  Mental Status: He is alert and oriented to person, place, and time.  ?Psychiatric:     ?   Behavior: Behavior normal.     ?   Thought Content: Thought content normal.     ?   Judgment: Judgment normal.  ? ? ? ?Lab Results  ?Component Value Date  ? WBC 6.3 08/09/2021  ? HGB 9.9 (L) 08/09/2021  ? HCT 29.9 (L) 08/09/2021  ? MCV 93.4 08/09/2021  ? PLT 135 (L) 08/09/2021  ? ?Lab Results  ?Component Value Date  ? FERRITIN 318 05/31/2021  ? IRON 111 05/31/2021  ? TIBC 314 05/31/2021  ? UIBC 203 05/31/2021  ? IRONPCTSAT 35 05/31/2021  ? ?Lab Results  ?Component Value Date  ? RETICCTPCT 1.5 08/09/2021  ? RBC 3.20 (L) 08/09/2021  ? RBC 3.19 (L) 08/09/2021  ? ?No results found for: KPAFRELGTCHN, LAMBDASER, KAPLAMBRATIO ?No results found for: IGGSERUM, IGA, IGMSERUM ?Lab Results  ?Component Value Date  ? TOTALPROTELP 6.0 (L) 12/26/2015  ? ALBUMINELP 3.4 (L) 12/26/2015  ? A1GS 0.4 (H) 12/26/2015  ? A2GS 0.8 12/26/2015  ? BETS 0.4 12/26/2015  ? BETA2SER 0.3 12/26/2015  ? GAMS 0.8 12/26/2015  ? MSPIKE Not Observed 01/29/2016  ? SPEI SEE NOTE 12/26/2015  ? ?  Chemistry   ?   ?Component Value Date/Time  ? NA 137 05/31/2021 1327  ? NA 141 08/11/2017 0924  ? NA 145 04/09/2017 0905  ? NA 140 03/28/2016 0853  ? K 4.7 05/31/2021 1327  ? K 4.5 04/09/2017 0905  ? K 4.4 03/28/2016 0853  ? CL 103 05/31/2021 1327  ? CL 105 04/09/2017 0905  ? CO2 27 05/31/2021 1327  ? CO2 27 04/09/2017 0905  ? CO2 19 (L) 03/28/2016 0853  ? BUN 39 (H) 05/31/2021 1327  ?  BUN 27 08/11/2017 0924  ? BUN 33 (H) 04/09/2017 0905  ? BUN 34.4 (H) 03/28/2016 0853  ? CREATININE 1.83 (H) 05/31/2021 1327  ? CREATININE 1.72 (H) 08/18/2017 1543  ? CREATININE 1.7 (H) 03/28/2016 0853  ? GLU 1

## 2021-08-09 NOTE — Patient Instructions (Signed)
Darbepoetin Alfa injection ?What is this medication? ?DARBEPOETIN ALFA (dar be POE e tin  AL fa) helps your body make more red blood cells. It is used to treat anemia caused by chronic kidney failure and chemotherapy. ?This medicine may be used for other purposes; ask your health care provider or pharmacist if you have questions. ?COMMON BRAND NAME(S): Aranesp ?What should I tell my care team before I take this medication? ?They need to know if you have any of these conditions: ?blood clotting disorders or history of blood clots ?cancer patient not on chemotherapy ?cystic fibrosis ?heart disease, such as angina, heart failure, or a history of a heart attack ?hemoglobin level of 12 g/dL or greater ?high blood pressure ?low levels of folate, iron, or vitamin B12 ?seizures ?an unusual or allergic reaction to darbepoetin, erythropoietin, albumin, hamster proteins, latex, other medicines, foods, dyes, or preservatives ?pregnant or trying to get pregnant ?breast-feeding ?How should I use this medication? ?This medicine is for injection into a vein or under the skin. It is usually given by a health care professional in a hospital or clinic setting. ?If you get this medicine at home, you will be taught how to prepare and give this medicine. Use exactly as directed. Take your medicine at regular intervals. Do not take your medicine more often than directed. ?It is important that you put your used needles and syringes in a special sharps container. Do not put them in a trash can. If you do not have a sharps container, call your pharmacist or healthcare provider to get one. ?A special MedGuide will be given to you by the pharmacist with each prescription and refill. Be sure to read this information carefully each time. ?Talk to your pediatrician regarding the use of this medicine in children. While this medicine may be used in children as young as 1 month of age for selected conditions, precautions do apply. ?Overdosage: If  you think you have taken too much of this medicine contact a poison control center or emergency room at once. ?NOTE: This medicine is only for you. Do not share this medicine with others. ?What if I miss a dose? ?If you miss a dose, take it as soon as you can. If it is almost time for your next dose, take only that dose. Do not take double or extra doses. ?What may interact with this medication? ?Do not take this medicine with any of the following medications: ?epoetin alfa ?This list may not describe all possible interactions. Give your health care provider a list of all the medicines, herbs, non-prescription drugs, or dietary supplements you use. Also tell them if you smoke, drink alcohol, or use illegal drugs. Some items may interact with your medicine. ?What should I watch for while using this medication? ?Your condition will be monitored carefully while you are receiving this medicine. ?You may need blood work done while you are taking this medicine. ?This medicine may cause a decrease in vitamin B6. You should make sure that you get enough vitamin B6 while you are taking this medicine. Discuss the foods you eat and the vitamins you take with your health care professional. ?What side effects may I notice from receiving this medication? ?Side effects that you should report to your doctor or health care professional as soon as possible: ?allergic reactions like skin rash, itching or hives, swelling of the face, lips, or tongue ?breathing problems ?changes in vision ?chest pain ?confusion, trouble speaking or understanding ?feeling faint or lightheaded, falls ?high blood   pressure ?muscle aches or pains ?pain, swelling, warmth in the leg ?rapid weight gain ?severe headaches ?sudden numbness or weakness of the face, arm or leg ?trouble walking, dizziness, loss of balance or coordination ?seizures (convulsions) ?swelling of the ankles, feet, hands ?unusually weak or tired ?Side effects that usually do not require  medical attention (report to your doctor or health care professional if they continue or are bothersome): ?diarrhea ?fever, chills (flu-like symptoms) ?headaches ?nausea, vomiting ?redness, stinging, or swelling at site where injected ?This list may not describe all possible side effects. Call your doctor for medical advice about side effects. You may report side effects to FDA at 1-800-FDA-1088. ?Where should I keep my medication? ?Keep out of the reach of children and pets. ?Store in a refrigerator. Do not freeze. Do not shake. Throw away any unused portion if using a single-dose vial. Multi-dose vials can be kept in the refrigerator for up to 21 days after the initial dose. Throw away unused medicine. ?To get rid of medications that are no longer needed or have expired: ?Take the medication to a medication take-back program. Check with your pharmacy or law enforcement to find a location. ?If you cannot return the medication, ask your pharmacist or care team how to get rid of the medication safely. ?NOTE: This sheet is a summary. It may not cover all possible information. If you have questions about this medicine, talk to your doctor, pharmacist, or health care provider. ?? 2023 Elsevier/Gold Standard (2021-04-15 00:00:00) ? ?

## 2021-08-12 ENCOUNTER — Other Ambulatory Visit: Payer: Self-pay

## 2021-08-12 ENCOUNTER — Other Ambulatory Visit: Payer: Self-pay | Admitting: Family Medicine

## 2021-08-12 DIAGNOSIS — H5201 Hypermetropia, right eye: Secondary | ICD-10-CM | POA: Diagnosis not present

## 2021-08-12 DIAGNOSIS — H353134 Nonexudative age-related macular degeneration, bilateral, advanced atrophic with subfoveal involvement: Secondary | ICD-10-CM | POA: Diagnosis not present

## 2021-08-12 DIAGNOSIS — H401134 Primary open-angle glaucoma, bilateral, indeterminate stage: Secondary | ICD-10-CM | POA: Diagnosis not present

## 2021-08-12 DIAGNOSIS — E119 Type 2 diabetes mellitus without complications: Secondary | ICD-10-CM | POA: Diagnosis not present

## 2021-08-12 LAB — HM DIABETES EYE EXAM

## 2021-08-12 LAB — FERRITIN: Ferritin: 387 ng/mL — ABNORMAL HIGH (ref 24–336)

## 2021-08-12 LAB — IRON AND IRON BINDING CAPACITY (CC-WL,HP ONLY)
Iron: 67 ug/dL (ref 45–182)
Saturation Ratios: 22 % (ref 17.9–39.5)
TIBC: 311 ug/dL (ref 250–450)
UIBC: 244 ug/dL (ref 117–376)

## 2021-08-12 MED ORDER — ONETOUCH VERIO VI STRP: ORAL_STRIP | 12 refills | Status: DC

## 2021-08-15 NOTE — Progress Notes (Signed)
Remote pacemaker transmission.   

## 2021-08-20 ENCOUNTER — Ambulatory Visit (INDEPENDENT_AMBULATORY_CARE_PROVIDER_SITE_OTHER): Payer: Medicare Other | Admitting: Family Medicine

## 2021-08-20 ENCOUNTER — Telehealth: Payer: Self-pay | Admitting: Family Medicine

## 2021-08-20 ENCOUNTER — Encounter: Payer: Self-pay | Admitting: Family Medicine

## 2021-08-20 VITALS — BP 134/55 | HR 60 | Temp 98.1°F | Ht 68.0 in | Wt 166.0 lb

## 2021-08-20 DIAGNOSIS — I5033 Acute on chronic diastolic (congestive) heart failure: Secondary | ICD-10-CM | POA: Diagnosis not present

## 2021-08-20 DIAGNOSIS — E8779 Other fluid overload: Secondary | ICD-10-CM

## 2021-08-20 LAB — BASIC METABOLIC PANEL
BUN: 39 mg/dL — ABNORMAL HIGH (ref 6–23)
CO2: 23 mEq/L (ref 19–32)
Calcium: 8.5 mg/dL (ref 8.4–10.5)
Chloride: 107 mEq/L (ref 96–112)
Creatinine, Ser: 2.07 mg/dL — ABNORMAL HIGH (ref 0.40–1.50)
GFR: 27.57 mL/min — ABNORMAL LOW (ref >60.00)
Glucose, Bld: 134 mg/dL — ABNORMAL HIGH (ref 70–99)
Potassium: 4.1 mEq/L (ref 3.5–5.1)
Sodium: 139 mEq/L (ref 135–145)

## 2021-08-20 LAB — BRAIN NATRIURETIC PEPTIDE: Pro B Natriuretic peptide (BNP): 1616 pg/mL — ABNORMAL HIGH (ref 0.0–100.0)

## 2021-08-20 NOTE — Patient Instructions (Signed)
Extra lasix next 3 days, unless you get down 5-7 lbs before 3rd dose.  ? ? ?Restrict sodium.  ?We will call you with labs and follow up Friday morning.  ?

## 2021-08-20 NOTE — Telephone Encounter (Signed)
Please call patient: ?His kidney function is mildly decreased from prior, this is probably secondary to the fluid overload. ?The BNP-which is a marker of fluid overload is very high and it 1616.  Normal is less than 100.  Level had been near 1500 last July when he also had fluid overload. ? ?We will follow-up with him on Friday to see how he is doing on the increased dose of Lasix and repeat labs at that time if necessary. ?   -If he experiences worsening short of breath or fatigue, does not see improvement as he should on the increased dose of Lasix, then I would encourage him to be seen more emergently in the ED. ?

## 2021-08-20 NOTE — Progress Notes (Signed)
? ? ? ?This visit occurred during the SARS-CoV-2 public health emergency.  Safety protocols were in place, including screening questions prior to the visit, additional usage of staff PPE, and extensive cleaning of exam room while observing appropriate contact time as indicated for disinfecting solutions.  ? ? ?[name] , 1928-10-23, 88 y.o., male ?MRN: [id number] ?Patient Care Team  ?  Relationship Specialty Notifications Start End  ?William Hillock, DO PCP - General Family Medicine  12/18/15   ?William Hector, MD PCP - Cardiology Cardiology  03/17/17   ?William Grayer, MD PCP - Electrophysiology Cardiology  06/26/21   ?William Drought, MD Consulting Physician Ophthalmology  12/18/15   ?William Miner, MD Consulting Physician Dermatology  12/18/15   ?William Hector, MD Consulting Physician Cardiology  12/18/15   ?William Stalls, MD Consulting Physician Ophthalmology  12/19/15   ?William Napoleon, MD Consulting Physician Oncology  09/30/16   ?William Agent, MD Attending Physician Nephrology  09/30/16   ?William Matin, MD Consulting Physician Dermatology  09/30/16   ?William Gallo, MD Consulting Physician Urology  06/30/19   ?[name], DPM Consulting Physician Podiatry  06/30/19   ? ? ?Chief Complaint  ?Patient presents with  ? Leg Swelling  ?  Pt c/o b/l leg swelling and SOB x 2 weeks  ? ?  ?Subjective: Pt presents for an OV with complaints of b/l leg swelling and shortness of breath of 2 weeks duration.  Associated symptoms include he noted being winded when bending over to tie his shoe first. Infusion 2 weeks ago- hgb 9.9.  Patient has a history of CHF and is prescribed Lasix 40 mg daily.  He reports compliance with his Lasix.  His wife recently had a fracture of her leg.  Since that time she is not preparing their meals and there are eating meals prepared by fellow church attendings.  Therefore, he is taking in more sodium than he has in the past. ? ?10/2020 ECHO: ?1. Left ventricular ejection fraction, by  estimation, is 40 to 45%. The  ?left ventricle has moderately decreased function. The left ventricle  ?demonstrates global hypokinesis with regional variation. There is moderate  ?left ventricular hypertrophy. Left  ?ventricular diastolic function could not be evaluated. Elevated left  ?ventricular end-diastolic pressure.  ? 2. Right ventricular systolic function is low normal. The right  ?ventricular size is normal. There is mildly elevated pulmonary artery  ?systolic pressure. The estimated right ventricular systolic pressure is  ?46.5 mmHg.  ? 3. Left atrial size was severely dilated.  ? 4. Right atrial size was severely dilated.  ? 5. The mitral valve is abnormal. Moderate mitral valve regurgitation.  ? 6. Tricuspid valve regurgitation is mild to moderate.  ? 7. The aortic valve is tricuspid. Aortic valve regurgitation is not  ?visualized.  ? 8. Aortic dilatation noted. There is borderline dilatation of the aortic  ?root, measuring 38 mm.  ? 9. The inferior vena cava is dilated in size with >50% respiratory  ?variability, suggesting right atrial pressure of 8 mmHg.  ?10. Evidence of atrial level shunting detected by color flow Doppler.  ?There is a small patent foramen ovale with predominantly left to right  ?shunting across the atrial septum.  ? ? ?  07/12/2021  ?  6:28 PM 07/12/2021  ?  6:26 PM 02/11/2021  ?  2:02 PM 05/02/2020  ?  1:33 PM 11/11/2018  ?  9:58 AM  ?Depression screen PHQ 2/9  ?Decreased Interest 0 0 0  0 0  ?Down, Depressed, Hopeless 0 0 0 0 0  ?PHQ - 2 Score 0 0 0 0 0  ? ? ?Allergies  ?Allergen Reactions  ? Penicillins Hives  ?  Has patient had a PCN reaction causing immediate rash, facial/tongue/throat swelling, SOB or lightheadedness with hypotension: No ?Has patient had a PCN reaction causing severe rash involving mucus membranes or skin necrosis: Yes ?Has patient had a PCN reaction that required hospitalization: No ?Has patient had a PCN reaction occurring within the last 10 years: No ?If all of  the above answers are "NO", then may proceed with Cephalosporin use. ?  ? Vancomycin Other (See Comments)  ?  Edema and myalgia  ? ?Social History  ? ?Social History Narrative  ? Married to [name], 2 children [name] and [name].  ? Some college. Retired from [employer].  ? Drinks caffeine, uses herbal remedies, takes a daily vitamin.  ? Wears his seatbelt, smoke detector at home, firearms in the home.  ? Wears a hearing aid.  ? Feels safe in her relationships.  ? ?Past Medical History:  ?Diagnosis Date  ? A-fib ([name])   ? Basal cell carcinoma   ? skin  ? Bigeminal rhythm   ? Bradycardia 06/21/2017  ? Cancer of anterior wall of urinary bladder ([place]) 07/18/2019  ? Cataract   ? Chronic renal insufficiency, stage 3 (moderate) ([place]) 2018  ? GFR 30s-40s  ? Coronary artery disease   ? post bypass  ? CVD (cardiovascular disease)   ? Diabetes mellitus without complication ([place])   ? type 2 diet controlled  ? Diverticular disease   ? Easy bruising   ? 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  ? Gout   ? Hematuria 06/30/2019  ? Hernia   ? Hyperkalemia 05/06/2017  ? Hyperlipidemia   ? Hypertension   ? IBS (irritable bowel syndrome)   ? Iron deficiency anemia 02/04/2016  ? Macular degeneration   ? Microscopic colitis   ? PVD (peripheral vascular disease) ([place])   ? ?Past Surgical History:  ?Procedure Laterality Date  ? CARDIAC CATHETERIZATION  2006  ? CARDIOVERSION N/A 02/22/2013  ? Procedure: CARDIOVERSION;  Surgeon: William Hector, MD;  Location: [place];  Service: Cardiovascular;  Laterality: N/A;  ? CARDIOVERSION N/A 11/15/2014  ? Procedure: CARDIOVERSION;  Surgeon: William Hector, MD;  Location: [place];  Service: Cardiovascular;  Laterality: N/A;  ? CARDIOVERSION N/A 04/01/2017  ? Procedure: CARDIOVERSION;  Surgeon: Larey Dresser, MD;  Location: [facility];  Service: Cardiovascular;  Laterality: N/A;  ? CAROTID ENDARTERECTOMY  2009/ 1993  ? left/ right  ? COLONOSCOPY  03/17/2005  ? The colon is normal.  ?  CORONARY ARTERY BYPASS GRAFT  1999  ? CYSTOSCOPY W/ RETROGRADES Bilateral 07/14/2019  ? Procedure: CYSTOSCOPY WITH RETROGRADE PYELOGRAM;  Surgeon: William Gallo, MD;  Location: [facility];  Service: Urology;  Laterality: Bilateral;  ? ESOPHAGOGASTRODUODENOSCOPY  03/17/2005  ? Normal esophagus. Normal Stomanch. Normal duodenum.   ? EYE SURGERY    ? eyelid repair  ? INGUINAL HERNIA REPAIR  02/11/2012  ? Procedure: LAPAROSCOPIC BILATERAL INGUINAL HERNIA REPAIR;  Surgeon: Pedro Earls, MD;  Location: [facility];  Service: General;  Laterality: Bilateral;  ? PACEMAKER IMPLANT N/A 07/28/2017  ? St Jude Medical Assurity MRI conditional  dual-chamber pacemaker for symptomatic second degree AV block by Dr Rayann Heman  ? SKIN CANCER EXCISION    ? right ear x 3  ? TRANSURETHRAL RESECTION OF BLADDER TUMOR N/A 09/05/2019  ?  Procedure: TRANSURETHRAL RESECTION OF BLADDER TUMOR (TURBT);  Surgeon: William Gallo, MD;  Location: [facility];  Service: Urology;  Laterality: N/A;  ? TRANSURETHRAL RESECTION OF BLADDER TUMOR WITH MITOMYCIN-C N/A 07/14/2019  ? Procedure: TRANSURETHRAL RESECTION OF BLADDER TUMOR WITH GEMCITABINE IN PACU;  Surgeon: William Gallo, MD;  Location: [facility];  Service: Urology;  Laterality: N/A;  ? ?Family History  ?Problem Relation Age of Onset  ? Stroke Mother   ? Coronary artery disease Father   ? Heart disease Father   ? Melanoma Sister   ? Colon cancer Neg Hx   ? ?Allergies as of 08/20/2021   ? ?   Reactions  ? Penicillins Hives  ? Has patient had a PCN reaction causing immediate rash, facial/tongue/throat swelling, SOB or lightheadedness with hypotension: No ?Has patient had a PCN reaction causing severe rash involving mucus membranes or skin necrosis: Yes ?Has patient had a PCN reaction that required hospitalization: No ?Has patient had a PCN reaction occurring within the last 10 years: No ?If all of the above answers are "NO", then may proceed with  Cephalosporin use.  ? Vancomycin Other (See Comments)  ? Edema and myalgia  ? ?  ? ?  ?Medication List  ?  ? ?  ? Accurate as of August 20, 2021 10:24 AM. If you have any questions, ask your nurse or doctor.  ?  ?  ? ?

## 2021-08-21 ENCOUNTER — Telehealth: Payer: Self-pay

## 2021-08-21 NOTE — Telephone Encounter (Signed)
Attempted to contact pt unable to LVM.  ?

## 2021-08-21 NOTE — Telephone Encounter (Signed)
LVM for [name] to [facility] regarding results for pt.   ?

## 2021-08-21 NOTE — Telephone Encounter (Signed)
Daughter of patient, Anne Ng [facility]) just following up from his visit yesterday with Dr. Raoul Pitch. Wanting to make sure what patient told her was accurate info about visit. ? ?Please call [phone] ?

## 2021-08-21 NOTE — Telephone Encounter (Signed)
Please use other encounter °

## 2021-08-21 NOTE — Telephone Encounter (Signed)
Hopkins, Dana 1 hour ago (9:38 AM)  ? ?DH ?Daughter of patient, Anne Ng [facility]) just following up from his visit yesterday with Dr. Raoul Pitch. Wanting to make sure what patient told her was accurate info about visit. ?  ?Please call [phone]  ?  ? ?

## 2021-08-22 NOTE — Telephone Encounter (Signed)
Spoke with pt regarding labs and instructions.   

## 2021-08-23 ENCOUNTER — Ambulatory Visit (INDEPENDENT_AMBULATORY_CARE_PROVIDER_SITE_OTHER): Payer: Medicare Other | Admitting: Family Medicine

## 2021-08-23 ENCOUNTER — Encounter: Payer: Self-pay | Admitting: Family Medicine

## 2021-08-23 VITALS — BP 137/55 | HR 60 | Temp 97.4°F | Ht 68.0 in | Wt 161.0 lb

## 2021-08-23 DIAGNOSIS — E8770 Fluid overload, unspecified: Secondary | ICD-10-CM | POA: Diagnosis not present

## 2021-08-23 DIAGNOSIS — I10 Essential (primary) hypertension: Secondary | ICD-10-CM | POA: Diagnosis not present

## 2021-08-23 DIAGNOSIS — I5033 Acute on chronic diastolic (congestive) heart failure: Secondary | ICD-10-CM

## 2021-08-23 LAB — BASIC METABOLIC PANEL
BUN: 39 mg/dL — ABNORMAL HIGH (ref 6–23)
CO2: 26 mEq/L (ref 19–32)
Calcium: 8.7 mg/dL (ref 8.4–10.5)
Chloride: 107 mEq/L (ref 96–112)
Creatinine, Ser: 2.14 mg/dL — ABNORMAL HIGH (ref 0.40–1.50)
GFR: 26.49 mL/min — ABNORMAL LOW (ref >60.00)
Glucose, Bld: 114 mg/dL — ABNORMAL HIGH (ref 70–99)
Potassium: 4.4 mEq/L (ref 3.5–5.1)
Sodium: 140 mEq/L (ref 135–145)

## 2021-08-23 LAB — BRAIN NATRIURETIC PEPTIDE: Pro B Natriuretic peptide (BNP): 1629 pg/mL — ABNORMAL HIGH (ref 0.0–100.0)

## 2021-08-23 NOTE — Patient Instructions (Addendum)
Double up today and then tomorrow start back normal regimen.  ? ?No follow-ups on file. ? ? ? ? ? ? ? ?Great to see you today.  ?I have refilled the medication(s) we provide.  ? ?If labs were collected, we will inform you of lab results once received either by echart message or telephone call.  ? - echart message- for normal results that have been seen by the patient already.  ? - telephone call: abnormal results or if patient has not viewed results in their echart. ? ? ? ? ? ?

## 2021-08-23 NOTE — Progress Notes (Signed)
? ? ? ?[name] , 18-Aug-1928, 88 y.o., male ?MRN: [id number] ?Patient Care Team  ?  Relationship Specialty Notifications Start End  ?[name], DO PCP - General Family Medicine  12/18/15   ?[name], MD PCP - Cardiology Cardiology  03/17/17   ?Thompson Grayer, MD PCP - Electrophysiology Cardiology  06/26/21   ?[name], MD Consulting Physician Ophthalmology  12/18/15   ?[name], MD Consulting Physician Dermatology  12/18/15   ?[name], MD Consulting Physician Cardiology  12/18/15   ?[name], MD Consulting Physician Ophthalmology  12/19/15   ?[name], MD Consulting Physician Oncology  09/30/16   ?[name], MD Attending Physician Nephrology  09/30/16   ?[name], MD Consulting Physician Dermatology  09/30/16   ?Franchot Gallo, MD Consulting Physician Urology  06/30/19   ?Inocencio Homes, DPM Consulting Physician Podiatry  06/30/19   ? ? ?Chief Complaint  ?Patient presents with  ? Congestive Heart Failure  ?  improving  ? Edema  ?  Some improvement  ? ?  ?Subjective: [name] is a 88 y.o. male with a history of HFrEF presented 3 days ago with lower extremity edema and a weight gain of approximately 7 pounds.  He is here for follow-up today after doubling up on Lasix the last 3 days.  Patient reports he is breathing much better today.  The swelling in his legs have reduced.  He reports overall he is feeling much better. ? ?Prior note: ?Pt presents for an OV with complaints of b/l leg swelling and shortness of breath of 2 weeks duration.  Associated symptoms include he noted being winded when bending over to tie his shoe first. Infusion 2 weeks ago- hgb 9.9.  Patient has a history of CHF and is prescribed Lasix 40 mg daily.  He reports compliance with his Lasix.  His wife recently had a fracture of her leg.  Since that time she is not preparing their meals and there are eating meals prepared by fellow church attendings.  Therefore, he is taking in more sodium  than he has in the past. ? ?10/2020 ECHO: ?1. Left ventricular ejection fraction, by estimation, is 40 to 45%. The  ?left ventricle has moderately decreased function. The left ventricle  ?demonstrates global hypokinesis with regional variation. There is moderate  ?left ventricular hypertrophy. Left  ?ventricular diastolic function could not be evaluated. Elevated left  ?ventricular end-diastolic pressure.  ? 2. Right ventricular systolic function is low normal. The right  ?ventricular size is normal. There is mildly elevated pulmonary artery  ?systolic pressure. The estimated right ventricular systolic pressure is  ?62.3 mmHg.  ? 3. Left atrial size was severely dilated.  ? 4. Right atrial size was severely dilated.  ? 5. The mitral valve is abnormal. Moderate mitral valve regurgitation.  ? 6. Tricuspid valve regurgitation is mild to moderate.  ? 7. The aortic valve is tricuspid. Aortic valve regurgitation is not  ?visualized.  ? 8. Aortic dilatation noted. There is borderline dilatation of the aortic  ?root, measuring 38 mm.  ? 9. The inferior vena cava is dilated in size with >50% respiratory  ?variability, suggesting right atrial pressure of 8 mmHg.  ?10. Evidence of atrial level shunting detected by color flow Doppler.  ?There is a small patent foramen ovale with predominantly left to right  ?shunting across the atrial septum.  ? ? ?  07/12/2021  ?  6:28 PM 07/12/2021  ?  6:26 PM 02/11/2021  ?  2:02 PM 05/02/2020  ?  1:33 PM 11/11/2018  ?  9:58 AM  ?Depression screen PHQ 2/9  ?Decreased Interest 0 0 0 0 0  ?Down, Depressed, Hopeless 0 0 0 0 0  ?PHQ - 2 Score 0 0 0 0 0  ? ? ?Allergies  ?Allergen Reactions  ? Penicillins Hives  ?  Has patient had a PCN reaction causing immediate rash, facial/tongue/throat swelling, SOB or lightheadedness with hypotension: No ?Has patient had a PCN reaction causing severe rash involving mucus membranes or skin necrosis: Yes ?Has patient had a PCN reaction that required hospitalization:  No ?Has patient had a PCN reaction occurring within the last 10 years: No ?If all of the above answers are "NO", then may proceed with Cephalosporin use. ?  ? Vancomycin Other (See Comments)  ?  Edema and myalgia  ? k ?Social History  ? ?Social History Narrative  ? Married to [name], 2 children [name] and [name].  ? Some college. Retired from [employer].  ? Drinks caffeine, uses herbal remedies, takes a daily vitamin.  ? Wears his seatbelt, smoke detector at home, firearms in the home.  ? Wears a hearing aid.  ? Feels safe in her relationships.  ? ?Past Medical History:  ?Diagnosis Date  ? A-fib ([place])   ? Basal cell carcinoma   ? skin  ? Bigeminal rhythm   ? Bradycardia 06/21/2017  ? Cancer of anterior wall of urinary bladder ([place]) 07/18/2019  ? Cataract   ? Chronic renal insufficiency, stage 3 (moderate) ([place]) 2018  ? GFR 30s-40s  ? Coronary artery disease   ? post bypass  ? CVD (cardiovascular disease)   ? Diabetes mellitus without complication ([place])   ? type 2 diet controlled  ? Diverticular disease   ? Easy bruising   ? 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  ? Gout   ? Hematuria 06/30/2019  ? Hernia   ? Hyperkalemia 05/06/2017  ? Hyperlipidemia   ? Hypertension   ? IBS (irritable bowel syndrome)   ? Iron deficiency anemia 02/04/2016  ? Macular degeneration   ? Microscopic colitis   ? PVD (peripheral vascular disease) ([place])   ? ?Past Surgical History:  ?Procedure Laterality Date  ? CARDIAC CATHETERIZATION  2006  ? CARDIOVERSION N/A 02/22/2013  ? Procedure: CARDIOVERSION;  Surgeon: [name], MD;  Location: [place];  Service: Cardiovascular;  Laterality: N/A;  ? CARDIOVERSION N/A 11/15/2014  ? Procedure: CARDIOVERSION;  Surgeon: [name], MD;  Location: [place];  Service: Cardiovascular;  Laterality: N/A;  ? CARDIOVERSION N/A 04/01/2017  ? Procedure: CARDIOVERSION;  Surgeon: Larey Dresser, MD;  Location: [facility]COPY;  Service: Cardiovascular;  Laterality: N/A;  ? CAROTID ENDARTERECTOMY   2009/ 1993  ? left/ right  ? COLONOSCOPY  03/17/2005  ? The colon is normal.  ? CORONARY ARTERY BYPASS GRAFT  1999  ? CYSTOSCOPY W/ RETROGRADES Bilateral 07/14/2019  ? Procedure: CYSTOSCOPY WITH RETROGRADE PYELOGRAM;  Surgeon: Franchot Gallo, MD;  Location: [facility];  Service: Urology;  Laterality: Bilateral;  ? ESOPHAGOGASTRODUODENOSCOPY  03/17/2005  ? Normal esophagus. Normal Stomanch. Normal duodenum.   ? EYE SURGERY    ? eyelid repair  ? INGUINAL HERNIA REPAIR  02/11/2012  ? Procedure: LAPAROSCOPIC BILATERAL INGUINAL HERNIA REPAIR;  Surgeon: Pedro Earls, MD;  Location: [facility];  Service: General;  Laterality: Bilateral;  ? PACEMAKER IMPLANT N/A 07/28/2017  ? St Jude Medical Assurity MRI conditional  dual-chamber pacemaker for symptomatic second degree AV block  by Dr Rayann Heman  ? SKIN CANCER EXCISION    ? right ear x 3  ? TRANSURETHRAL RESECTION OF BLADDER TUMOR N/A 09/05/2019  ? Procedure: TRANSURETHRAL RESECTION OF BLADDER TUMOR (TURBT);  Surgeon: Franchot Gallo, MD;  Location: [facility];  Service: Urology;  Laterality: N/A;  ? TRANSURETHRAL RESECTION OF BLADDER TUMOR WITH MITOMYCIN-C N/A 07/14/2019  ? Procedure: TRANSURETHRAL RESECTION OF BLADDER TUMOR WITH GEMCITABINE IN PACU;  Surgeon: Franchot Gallo, MD;  Location: [facility];  Service: Urology;  Laterality: N/A;  ? ?Family History  ?Problem Relation Age of Onset  ? Stroke Mother   ? Coronary artery disease Father   ? Heart disease Father   ? Melanoma Sister   ? Colon cancer Neg Hx   ? ?Allergies as of 08/23/2021   ? ?   Reactions  ? Penicillins Hives  ? Has patient had a PCN reaction causing immediate rash, facial/tongue/throat swelling, SOB or lightheadedness with hypotension: No ?Has patient had a PCN reaction causing severe rash involving mucus membranes or skin necrosis: Yes ?Has patient had a PCN reaction that required hospitalization: No ?Has patient had a PCN reaction occurring within the  last 10 years: No ?If all of the above answers are "NO", then may proceed with Cephalosporin use.  ? Vancomycin Other (See Comments)  ? Edema and myalgia  ? ?  ? ?  ?Medication List  ?  ? ?  ? Accurate as of

## 2021-08-26 ENCOUNTER — Telehealth: Payer: Self-pay | Admitting: Family Medicine

## 2021-08-26 MED ORDER — FUROSEMIDE 40 MG PO TABS: 40.0000 mg | ORAL_TABLET | Freq: Every day | ORAL | 3 refills | Status: DC

## 2021-08-26 MED ORDER — POTASSIUM CHLORIDE ER 10 MEQ PO TBCR: 10.0000 meq | EXTENDED_RELEASE_TABLET | Freq: Every day | ORAL | 3 refills | Status: DC

## 2021-08-26 NOTE — Telephone Encounter (Signed)
Spoke with pt regarding labs and instructions. Pt states that he is down 155# without clothes in the morning. Pt states he feels good as well.  ?

## 2021-08-26 NOTE — Addendum Note (Signed)
Addended by: Howard Pouch A on: 08/26/2021 09:38 AM ? ? Modules accepted: Orders ? ?

## 2021-08-26 NOTE — Telephone Encounter (Signed)
Pt informed

## 2021-08-26 NOTE — Telephone Encounter (Signed)
Attempted to call pt regarding results. Unable to LVM.  ?

## 2021-08-26 NOTE — Telephone Encounter (Signed)
Great.  [name] to hear.  Continue his medications at the normal doses for him.  Glad he is feeling better.  I hope he has a good trip. ?

## 2021-08-26 NOTE — Telephone Encounter (Signed)
Please call patient: ?Please ask him if he was able to keep the weight down over the weekend after returning to normal dose of Lasix? ?His potassium levels look good. ?His BNP which is a fluid indicator, is still elevated as of Friday. ? ?Like to give him further instructions for this coming week on his Lasix depending upon how he states he is feeling today and if he has regained any of the weight? ?Please advise ?

## 2021-08-30 ENCOUNTER — Other Ambulatory Visit: Payer: Self-pay

## 2021-08-30 MED ORDER — POTASSIUM CHLORIDE ER 10 MEQ PO TBCR: 10.0000 meq | EXTENDED_RELEASE_TABLET | Freq: Every day | ORAL | 3 refills | Status: DC

## 2021-08-31 ENCOUNTER — Other Ambulatory Visit: Payer: Self-pay | Admitting: Family Medicine

## 2021-09-10 DIAGNOSIS — H35373 Puckering of macula, bilateral: Secondary | ICD-10-CM | POA: Diagnosis not present

## 2021-09-10 DIAGNOSIS — H35423 Microcystoid degeneration of retina, bilateral: Secondary | ICD-10-CM | POA: Diagnosis not present

## 2021-09-10 DIAGNOSIS — H353134 Nonexudative age-related macular degeneration, bilateral, advanced atrophic with subfoveal involvement: Secondary | ICD-10-CM | POA: Diagnosis not present

## 2021-09-10 DIAGNOSIS — H43813 Vitreous degeneration, bilateral: Secondary | ICD-10-CM | POA: Diagnosis not present

## 2021-09-16 ENCOUNTER — Encounter (HOSPITAL_BASED_OUTPATIENT_CLINIC_OR_DEPARTMENT_OTHER): Payer: Self-pay | Admitting: *Deleted

## 2021-09-16 ENCOUNTER — Inpatient Hospital Stay (HOSPITAL_BASED_OUTPATIENT_CLINIC_OR_DEPARTMENT_OTHER)
Admission: EM | Admit: 2021-09-16 | Discharge: 2021-09-20 | DRG: 291 | Disposition: A | Discharge status: Home / Self Care | Payer: Medicare Other | Attending: Internal Medicine | Admitting: Internal Medicine

## 2021-09-16 ENCOUNTER — Emergency Department (HOSPITAL_BASED_OUTPATIENT_CLINIC_OR_DEPARTMENT_OTHER): Payer: Medicare Other

## 2021-09-16 ENCOUNTER — Other Ambulatory Visit: Payer: Self-pay

## 2021-09-16 DIAGNOSIS — N183 Chronic kidney disease, stage 3 unspecified: Secondary | ICD-10-CM | POA: Diagnosis present

## 2021-09-16 DIAGNOSIS — E1151 Type 2 diabetes mellitus with diabetic peripheral angiopathy without gangrene: Secondary | ICD-10-CM | POA: Diagnosis present

## 2021-09-16 DIAGNOSIS — L603 Nail dystrophy: Secondary | ICD-10-CM | POA: Diagnosis not present

## 2021-09-16 DIAGNOSIS — I130 Hypertensive heart and chronic kidney disease with heart failure and stage 1 through stage 4 chronic kidney disease, or unspecified chronic kidney disease: Secondary | ICD-10-CM | POA: Diagnosis present

## 2021-09-16 DIAGNOSIS — I2510 Atherosclerotic heart disease of native coronary artery without angina pectoris: Secondary | ICD-10-CM | POA: Diagnosis present

## 2021-09-16 DIAGNOSIS — D509 Iron deficiency anemia, unspecified: Secondary | ICD-10-CM | POA: Diagnosis present

## 2021-09-16 DIAGNOSIS — I10 Essential (primary) hypertension: Secondary | ICD-10-CM | POA: Diagnosis present

## 2021-09-16 DIAGNOSIS — Z8551 Personal history of malignant neoplasm of bladder: Secondary | ICD-10-CM | POA: Diagnosis not present

## 2021-09-16 DIAGNOSIS — R7989 Other specified abnormal findings of blood chemistry: Secondary | ICD-10-CM

## 2021-09-16 DIAGNOSIS — J90 Pleural effusion, not elsewhere classified: Secondary | ICD-10-CM

## 2021-09-16 DIAGNOSIS — Z7901 Long term (current) use of anticoagulants: Secondary | ICD-10-CM | POA: Diagnosis not present

## 2021-09-16 DIAGNOSIS — D631 Anemia in chronic kidney disease: Secondary | ICD-10-CM | POA: Diagnosis present

## 2021-09-16 DIAGNOSIS — I442 Atrioventricular block, complete: Secondary | ICD-10-CM | POA: Diagnosis present

## 2021-09-16 DIAGNOSIS — Z8249 Family history of ischemic heart disease and other diseases of the circulatory system: Secondary | ICD-10-CM

## 2021-09-16 DIAGNOSIS — I5023 Acute on chronic systolic (congestive) heart failure: Secondary | ICD-10-CM | POA: Diagnosis not present

## 2021-09-16 DIAGNOSIS — N184 Chronic kidney disease, stage 4 (severe): Secondary | ICD-10-CM | POA: Diagnosis present

## 2021-09-16 DIAGNOSIS — Z85828 Personal history of other malignant neoplasm of skin: Secondary | ICD-10-CM

## 2021-09-16 DIAGNOSIS — Z87891 Personal history of nicotine dependence: Secondary | ICD-10-CM

## 2021-09-16 DIAGNOSIS — E1122 Type 2 diabetes mellitus with diabetic chronic kidney disease: Secondary | ICD-10-CM | POA: Diagnosis present

## 2021-09-16 DIAGNOSIS — Z951 Presence of aortocoronary bypass graft: Secondary | ICD-10-CM | POA: Diagnosis not present

## 2021-09-16 DIAGNOSIS — Z79899 Other long term (current) drug therapy: Secondary | ICD-10-CM

## 2021-09-16 DIAGNOSIS — I509 Heart failure, unspecified: Secondary | ICD-10-CM

## 2021-09-16 DIAGNOSIS — Z823 Family history of stroke: Secondary | ICD-10-CM

## 2021-09-16 DIAGNOSIS — Z880 Allergy status to penicillin: Secondary | ICD-10-CM

## 2021-09-16 DIAGNOSIS — R0602 Shortness of breath: Secondary | ICD-10-CM | POA: Diagnosis not present

## 2021-09-16 DIAGNOSIS — Z20822 Contact with and (suspected) exposure to covid-19: Secondary | ICD-10-CM | POA: Diagnosis present

## 2021-09-16 DIAGNOSIS — Z808 Family history of malignant neoplasm of other organs or systems: Secondary | ICD-10-CM | POA: Diagnosis not present

## 2021-09-16 DIAGNOSIS — E785 Hyperlipidemia, unspecified: Secondary | ICD-10-CM | POA: Diagnosis present

## 2021-09-16 DIAGNOSIS — Z66 Do not resuscitate: Secondary | ICD-10-CM | POA: Diagnosis not present

## 2021-09-16 DIAGNOSIS — Z888 Allergy status to other drugs, medicaments and biological substances status: Secondary | ICD-10-CM

## 2021-09-16 DIAGNOSIS — I4891 Unspecified atrial fibrillation: Secondary | ICD-10-CM | POA: Diagnosis present

## 2021-09-16 DIAGNOSIS — I480 Paroxysmal atrial fibrillation: Secondary | ICD-10-CM | POA: Diagnosis present

## 2021-09-16 DIAGNOSIS — N1832 Chronic kidney disease, stage 3b: Secondary | ICD-10-CM | POA: Diagnosis not present

## 2021-09-16 DIAGNOSIS — I110 Hypertensive heart disease with heart failure: Secondary | ICD-10-CM | POA: Diagnosis not present

## 2021-09-16 DIAGNOSIS — I739 Peripheral vascular disease, unspecified: Secondary | ICD-10-CM | POA: Diagnosis not present

## 2021-09-16 DIAGNOSIS — Z950 Presence of cardiac pacemaker: Secondary | ICD-10-CM

## 2021-09-16 DIAGNOSIS — M109 Gout, unspecified: Secondary | ICD-10-CM | POA: Diagnosis present

## 2021-09-16 DIAGNOSIS — L84 Corns and callosities: Secondary | ICD-10-CM | POA: Diagnosis not present

## 2021-09-16 LAB — HEPATIC FUNCTION PANEL
ALT: 19 U/L (ref 0–44)
AST: 25 U/L (ref 15–41)
Albumin: 3.7 g/dL (ref 3.5–5.0)
Alkaline Phosphatase: 85 U/L (ref 38–126)
Bilirubin, Direct: 0.1 mg/dL (ref 0.0–0.2)
Indirect Bilirubin: 0.6 mg/dL (ref 0.3–0.9)
Total Bilirubin: 0.7 mg/dL (ref 0.3–1.2)
Total Protein: 7.2 g/dL (ref 6.5–8.1)

## 2021-09-16 LAB — BRAIN NATRIURETIC PEPTIDE: B Natriuretic Peptide: 1729.1 pg/mL — ABNORMAL HIGH (ref 0.0–100.0)

## 2021-09-16 LAB — BASIC METABOLIC PANEL
Anion gap: 13 (ref 5–15)
BUN: 52 mg/dL — ABNORMAL HIGH (ref 8–23)
CO2: 21 mmol/L — ABNORMAL LOW (ref 22–32)
Calcium: 9.1 mg/dL (ref 8.9–10.3)
Chloride: 106 mmol/L (ref 98–111)
Creatinine, Ser: 2.47 mg/dL — ABNORMAL HIGH (ref 0.61–1.24)
GFR, Estimated: 24 mL/min — ABNORMAL LOW (ref >60)
Glucose, Bld: 122 mg/dL — ABNORMAL HIGH (ref 70–99)
Potassium: 4.3 mmol/L (ref 3.5–5.1)
Sodium: 140 mmol/L (ref 135–145)

## 2021-09-16 LAB — CBC
HCT: 32.8 % — ABNORMAL LOW (ref 39.0–52.0)
Hemoglobin: 10.7 g/dL — ABNORMAL LOW (ref 13.0–17.0)
MCH: 30.3 pg (ref 26.0–34.0)
MCHC: 32.6 g/dL (ref 30.0–36.0)
MCV: 92.9 fL (ref 80.0–100.0)
Platelets: 138 10*3/uL — ABNORMAL LOW (ref 150–400)
RBC: 3.53 MIL/uL — ABNORMAL LOW (ref 4.22–5.81)
RDW: 15.5 % (ref 11.5–15.5)
WBC: 6.8 10*3/uL (ref 4.0–10.5)
nRBC: 0 % (ref 0.0–0.2)

## 2021-09-16 LAB — TROPONIN I (HIGH SENSITIVITY)
Troponin I (High Sensitivity): 24 ng/L — ABNORMAL HIGH (ref <18)
Troponin I (High Sensitivity): 25 ng/L — ABNORMAL HIGH (ref <18)

## 2021-09-16 LAB — RESP PANEL BY RT-PCR (FLU A&B, COVID) ARPGX2
Influenza A by PCR: NEGATIVE
Influenza B by PCR: NEGATIVE
SARS Coronavirus 2 by RT PCR: NEGATIVE

## 2021-09-16 MED ORDER — TIMOLOL MALEATE 0.5 % OP SOLN
1.0000 [drp] | Freq: Two times a day (BID) | OPHTHALMIC | Status: DC
  Administered 2021-09-17 – 2021-09-20 (×8): 1 [drp] via OPHTHALMIC
  Filled 2021-09-16 (×2): qty 5

## 2021-09-16 MED ORDER — ATORVASTATIN CALCIUM 10 MG PO TABS
20.0000 mg | ORAL_TABLET | Freq: Every day | ORAL | Status: DC
  Administered 2021-09-17 – 2021-09-19 (×4): 20 mg via ORAL
  Filled 2021-09-16 (×5): qty 2

## 2021-09-16 MED ORDER — FUROSEMIDE 40 MG PO TABS
40.0000 mg | ORAL_TABLET | Freq: Every day | ORAL | Status: DC
Start: 2021-09-17 — End: 2021-09-17
  Administered 2021-09-17: 40 mg via ORAL

## 2021-09-16 MED ORDER — FUROSEMIDE 40 MG PO TABS
40.0000 mg | ORAL_TABLET | Freq: Every day | ORAL | Status: DC
  Filled 2021-09-16: qty 1

## 2021-09-16 MED ORDER — CARVEDILOL 6.25 MG PO TABS
6.2500 mg | ORAL_TABLET | Freq: Two times a day (BID) | ORAL | Status: DC
  Administered 2021-09-17 – 2021-09-20 (×7): 6.25 mg via ORAL
  Filled 2021-09-16 (×7): qty 1

## 2021-09-16 MED ORDER — APIXABAN 2.5 MG PO TABS
2.5000 mg | ORAL_TABLET | Freq: Two times a day (BID) | ORAL | Status: DC
  Administered 2021-09-17 – 2021-09-20 (×8): 2.5 mg via ORAL
  Filled 2021-09-16 (×8): qty 1

## 2021-09-16 MED ORDER — AMLODIPINE BESYLATE 5 MG PO TABS
5.0000 mg | ORAL_TABLET | Freq: Every day | ORAL | Status: DC
  Administered 2021-09-17 – 2021-09-20 (×4): 5 mg via ORAL
  Filled 2021-09-16 (×4): qty 1

## 2021-09-16 MED ORDER — FUROSEMIDE 10 MG/ML IJ SOLN
20.0000 mg | Freq: Once | INTRAMUSCULAR | Status: AC
  Administered 2021-09-16: 20 mg via INTRAVENOUS
  Filled 2021-09-16: qty 2

## 2021-09-16 NOTE — ED Notes (Signed)
Called lab, spoke to isaac to add hepatic function panel onto previously drawn labs

## 2021-09-16 NOTE — ED Provider Notes (Signed)
[facility]PART[facility] Provider Note   CSN: [id number] Arrival date & time: 09/16/21  1626     History  Chief Complaint  Patient presents with   Shortness of Breath    [name] is a 88 y.o. male.   Shortness of Breath  Patient is a 88 year old male with a past medical history significant for A-fib on Eliquis, complete heart block with pacemaker, CKD 3, iron deficiency anemia receiving IV iron CHF on 40 mg once daily Lasix, CAD status post CABG in late 90s last cath was 2011  Patient is presented emergency room today due to shortness of breath seems that it is been ongoing for approximately 1 month but became worse over the past 2 weeks.  He states that his work shortness of breath episodes when he bends over to put on socks or tie his shoes.  He states sometimes short of breath when laying flat but not severely so.  He is not experiencing decreased exercise tolerance.  He states that approximately 3 weeks ago he took a trip to [place] and had an increased dose of Lasix 80 mg daily during that time he felt that he lost some weight and states that when he returned approximately 2 weeks ago and return to his 40 mg dose is breathing became more difficult.  He denies any episodes of chest pain.  He does endorse bilateral leg swelling.  He states his right leg is always more swollen than left because of vein harvest during CABG      Home Medications Prior to Admission medications   Medication Sig Start Date End Date Taking? Authorizing Provider  allopurinol (ZYLOPRIM) 300 MG tablet Take 0.5 tablets (150 mg total) by mouth daily. 02/11/21   Kuneff, Renee A, DO  amLODipine (NORVASC) 5 MG tablet TAKE 1 TABLET DAILY 08/12/21   Kuneff, Renee A, DO  apixaban (ELIQUIS) 2.5 MG TABS tablet TAKE 1 TABLET TWICE A DAY 02/21/21   [name], MD  atorvastatin (LIPITOR) 20 MG tablet Take 1 tablet (20 mg total) by mouth at bedtime. 02/11/21   Kuneff, Renee A, DO  carvedilol  (COREG) 6.25 MG tablet Take 1 tablet (6.25 mg total) by mouth 2 (two) times daily with a meal. 02/11/21   Kuneff, Renee A, DO  Cholecalciferol 125 MCG (5000 UT) capsule Take 5,000 Units by mouth daily.    [provider]  dicyclomine (BENTYL) 10 MG capsule Take 1 capsule (10 mg total) by mouth 4 (four) times daily -  before meals and at bedtime. 02/11/21   Kuneff, Renee A, DO  diphenoxylate-atropine (LOMOTIL) 2.5-0.025 MG tablet 1 tablet. Pt takes PRN 08/23/18   [provider]  finasteride (PROSCAR) 5 MG tablet Take 5 mg by mouth daily. 08/24/17   [provider]  fish oil-omega-3 fatty acids 1000 MG capsule Take 1 g by mouth daily.     [provider]  furosemide (LASIX) 40 MG tablet Take 1 tablet (40 mg total) by mouth daily. 08/26/21   Kuneff, Renee A, DO  glucose blood (ONETOUCH VERIO) test strip Use as instructed 08/12/21   Raoul Pitch, Renee A, DO  Lancets (ONETOUCH ULTRASOFT) lancets Use as instructed 02/12/21   Kuneff, Renee A, DO  latanoprost (XALATAN) 0.005 % ophthalmic solution Place 1 drop into both eyes at bedtime. 07/14/18   [provider]  Multiple Vitamins-Minerals (PRESERVISION AREDS PO) Take 1 tablet by mouth 2 (two) times daily.     [provider]  potassium  chloride (KLOR-CON) 10 MEQ tablet Take 1-2 tablets (10-20 mEq total) by mouth daily. 08/30/21   Kuneff, Renee A, DO  Probiotic Product (PROBIOTIC PO) Take 1 tablet by mouth daily.    [provider]  tamsulosin (FLOMAX) 0.4 MG CAPS capsule Take 0.4 mg by mouth.    [provider]  timolol (TIMOPTIC) 0.5 % ophthalmic solution Place 1 drop into both eyes 2 (two) times daily.  10/06/18   [provider]  UNABLE TO FIND Med Name: Ester Rink    [provider]  VANADYL SULFATE PO Take 1 capsule by mouth every evening.     [provider]      Allergies    Penicillins and Vancomycin    Review of Systems   Review of Systems  Respiratory:   Positive for shortness of breath.    Physical Exam Updated Vital Signs BP (!) 154/64   Pulse 63   Temp 98 F (36.7 C) (Oral)   Resp (!) 24   Wt 74.8 kg   SpO2 94%   BMI 25.09 kg/m  Physical Exam Vitals and nursing note reviewed.  Constitutional:      General: He is not in acute distress. HENT:     Head: Normocephalic and atraumatic.     Nose: Nose normal.  Eyes:     General: No scleral icterus. Cardiovascular:     Rate and Rhythm: Normal rate and regular rhythm.     Pulses: Normal pulses.     Heart sounds: Normal heart sounds.  Pulmonary:     Effort: Pulmonary effort is normal. No respiratory distress.     Breath sounds: Rales present. No wheezing.     Comments: Bibasilar crackles, not tachypneic, speaking in full sentences Abdominal:     Palpations: Abdomen is soft.     Tenderness: There is no abdominal tenderness.  Musculoskeletal:     Cervical back: Normal range of motion.     Right lower leg: Edema present.     Left lower leg: Edema present.     Comments: 2+ pitting edema to bilateral lower extremities from mid tibia down  Skin:    General: Skin is warm and dry.     Capillary Refill: Capillary refill takes less than 2 seconds.  Neurological:     Mental Status: He is alert. Mental status is at baseline.  Psychiatric:        Mood and Affect: Mood normal.        Behavior: Behavior normal.    ED Results / Procedures / Treatments   Labs (all labs ordered are listed, but only abnormal results are displayed) Labs Reviewed  BASIC METABOLIC PANEL - Abnormal; Notable for the following components:      Result Value   CO2 21 (*)    Glucose, Bld 122 (*)    BUN 52 (*)    Creatinine, Ser 2.47 (*)    GFR, Estimated 24 (*)    All other components within normal limits  CBC - Abnormal; Notable for the following components:   RBC 3.53 (*)    Hemoglobin 10.7 (*)    HCT 32.8 (*)    Platelets 138 (*)    All other components within normal limits  BRAIN NATRIURETIC  PEPTIDE - Abnormal; Notable for the following components:   B Natriuretic Peptide 1,729.1 (*)    All other components within normal limits  TROPONIN I (HIGH SENSITIVITY) - Abnormal; Notable for the following components:   Troponin I (High Sensitivity)  24 (*)    All other components within normal limits  RESP PANEL BY RT-PCR (FLU A&B, COVID) ARPGX2  HEPATIC FUNCTION PANEL    EKG EKG Interpretation  Date/Time:  Monday Sep 16 2021 16:40:17 EDT Ventricular Rate:  63 PR Interval:    QRS Duration: 189 QT Interval:  507 QTC Calculation: 520 R Axis:   245 Text Interpretation: VENTRICULAR PACED RHYTHM Confirmed by Isla Pence [phone]) on 09/16/2021 5:08:22 PM  Radiology DG Chest 2 View  Result Date: 09/16/2021 CLINICAL DATA:  Shortness of breath EXAM: CHEST - 2 VIEW COMPARISON:  11/12/2020 FINDINGS: Post sternotomy changes. Left-sided pacing device. Small bilateral effusions. Borderline cardiac size. Aortic atherosclerosis. No pneumothorax. IMPRESSION: Borderline cardiac size with small bilateral effusions and mild central congestion. Electronically Signed   By: Donavan Foil M.D.   On: 09/16/2021 17:16    Procedures Procedures    Medications Ordered in ED Medications  furosemide (LASIX) injection 20 mg (20 mg Intravenous Given 09/16/21 1743)    ED Course/ Medical Decision Making/ A&P                           Medical Decision Making Amount and/or Complexity of Data Reviewed Labs: ordered. Radiology: ordered.   This patient presents to the ED for concern of SOB, this involves a number of treatment options, and is a complaint that carries with it a high risk of complications and morbidity.  The differential diagnosis includes  The causes for shortness of breath include but are not limited to Cardiac (AHF, pericardial effusion and tamponade, arrhythmias, ischemia, etc) Respiratory (COPD, asthma, pneumonia, pneumothorax, primary pulmonary hypertension, PE/VQ  mismatch) Hematological (anemia) Neuromuscular (ALS, Guillain-Barr, etc)    Co morbidities: Discussed in HPI   Brief History:  Patient is a 88 year old male with a past medical history significant for A-fib on Eliquis, complete heart block with pacemaker, CKD 3, iron deficiency anemia receiving IV iron CHF on 40 mg once daily Lasix, CAD status post CABG in late 90s last cath was 2011  Patient is presented emergency room today due to shortness of breath seems that it is been ongoing for approximately 1 month but became worse over the past 2 weeks.  He states that his work shortness of breath episodes when he bends over to put on socks or tie his shoes.  He states sometimes short of breath when laying flat but not severely so.  He is not experiencing decreased exercise tolerance.  He states that approximately 3 weeks ago he took a trip to [place] and had an increased dose of Lasix 80 mg daily during that time he felt that he lost some weight and states that when he returned approximately 2 weeks ago and return to his 40 mg dose is breathing became more difficult.  He denies any episodes of chest pain.  He does endorse bilateral leg swelling.  He states his right leg is always more swollen than left because of vein harvest during CABG    EMR reviewed including pt PMHx, past surgical history and past visits to ER.   See HPI for more details   Lab Tests:   I ordered and independently interpreted labs. Labs notable for BNP elevated at 1729 seems to have trended up generally although there was a slight dip a few weeks ago likely in the setting of his doubled Lasix use.  BMP with creatinine at 2.47 slightly worsened today.  CBC stable anemia.  LFTs unremarkable  troponin x1 is 24 which is consistent with his prior.  COVID/flu and second troponin pending at time of admission.   Imaging Studies:  Abnormal findings. I personally reviewed all imaging studies. Imaging notable for Bilateral pleural  effusions and some pulmonary edema enlarged cardiac silhouette   Cardiac Monitoring:  The patient was maintained on a cardiac monitor.  I personally viewed and interpreted the cardiac monitored which showed an underlying rhythm of: Ventricularly paced rhythm EKG non-ischemic   Medicines ordered:  I ordered medication including 10 mg of Lasix for fluid overload Reevaluation of the patient after these medicines showed that the patient stayed the same I have reviewed the patients home medicines and have made adjustments as needed   Critical Interventions:  REVIEW OF EMR  Reviewed echo reports  10/2020 ECHO: 1. Left ventricular ejection fraction, by estimation, is 40 to 45%. The  left ventricle has moderately decreased function. The left ventricle  demonstrates global hypokinesis with regional variation. There is moderate  left ventricular hypertrophy. Left  ventricular diastolic function could not be evaluated. Elevated left  ventricular end-diastolic pressure.   2. Right ventricular systolic function is low normal. The right  ventricular size is normal. There is mildly elevated pulmonary artery  systolic pressure. The estimated right ventricular systolic pressure is  25.8 mmHg.   3. Left atrial size was severely dilated.   4. Right atrial size was severely dilated.   5. The mitral valve is abnormal. Moderate mitral valve regurgitation.   6. Tricuspid valve regurgitation is mild to moderate.   7. The aortic valve is tricuspid. Aortic valve regurgitation is not  visualized.   8. Aortic dilatation noted. There is borderline dilatation of the aortic  root, measuring 38 mm.   9. The inferior vena cava is dilated in size with >50% respiratory  variability, suggesting right atrial pressure of 8 mmHg.  10. Evidence of atrial level shunting detected by color flow Doppler.  There is a small patent foramen ovale with predominantly left to right  shunting across the atrial septum.     Consults/Attending Physician   I discussed this case with my attending physician who cosigned this note including patient's presenting symptoms, physical exam, and planned diagnostics and interventions. Attending physician stated agreement with plan or made changes to plan which were implemented.   Attending physician assessed patient at bedside.   Discussed with Dr. Sherrian Divers who will admit  Reevaluation:  After the interventions noted above I re-evaluated patient and found that they have :stayed the same   Social Determinants of Health:      Problem List / ED Course:  CHF, with exacerbation today in the setting of gradually worsening creatinine.  Has failed outpatient diuresis.  He took a 40 mg Lasix today.  Given an additional 20 IV Lasix in the emergency room and will admit to hospital.   Dispostion:  After consideration of the diagnostic results and the patients response to treatment, I feel that the patent would benefit from admission  Final Clinical Impression(s) / ED Diagnoses Final diagnoses:  Acute on chronic congestive heart failure, unspecified heart failure type ([place])  Pleural effusion, bilateral  Creatinine elevation    Rx / DC Orders ED Discharge Orders     None         Tedd Sias, [place] 09/16/21 1837    Isla Pence, MD 09/16/21 [phone]

## 2021-09-16 NOTE — ED Notes (Signed)
Offered pt a meal (tv dinner) or snacks but pt declines and requests only a black coffee which was given to pt.  Family remains supportive at the bedside

## 2021-09-16 NOTE — ED Triage Notes (Signed)
Pt is here for sob which began about 2 weeks ago and has been increasing.  Pt has hx of CHF and in the last couple of days his sob has increased.  Pt feels that he has gained about 15lbs.  No CP. Pt is alert and oriented, he is here with his daughter in law.  He lives at home independently.  Pt lost 6lbs before being approved to travel to [place] for honor flight but feels he might have gained that back.

## 2021-09-16 NOTE — ED Notes (Signed)
Call to patient placement to check and see about bed status.  No beds available.  Notified EDP to see if they want to order medications including home meds

## 2021-09-16 NOTE — ED Notes (Signed)
Called Bed Placement and s/w Juliann Pulse - requested status of bed @ [facility].  Was advised that there were no cardiac beds available and we were at a stand still until someone was discharged.

## 2021-09-16 NOTE — Progress Notes (Addendum)
Plan of Care Note for accepted transfer   Patient: [name] MRN: [id number]   DOA: 09/16/2021  Facility requesting transfer: [facility] ED Requesting Provider:  Reason for transfer: CHF exacerbation Facility course: 88 year old M with CHB s/p pacemaker, PAF on Eliquis, remote hx of CABG, HTN, bladder cancer s/p TURBT, erythropoietin deficiency and iron deficiency anemia who presented with increasing shortness of breath.   Has been working with PCP adjusting his Lasix and recently went back to normal dose '40mg'$  but had returned due to increasing shortness of breath and weight gain.  Chest x-ray showing small effusion and central congestion.  BNP of 1700s, troponin 24.  Creatinine is also worsening on presentation at 2.47 from 2.14 about 3 weeks ago.  Remains on room air.  Given IV 20 mg of Lasix in the ED.  Plan of care: The patient is accepted for admission to Telemetry unit, at [facility]..  ED physician to continue care while pt remains   Author: Orene Desanctis, DO 09/16/2021  Check [email or web address] for on-call coverage.  Nursing staff, Please call [facility] [place] number on Amion as soon as patient's arrival, so appropriate admitting provider can evaluate the pt.

## 2021-09-17 DIAGNOSIS — Z87891 Personal history of nicotine dependence: Secondary | ICD-10-CM | POA: Diagnosis not present

## 2021-09-17 DIAGNOSIS — N1832 Chronic kidney disease, stage 3b: Secondary | ICD-10-CM | POA: Diagnosis not present

## 2021-09-17 DIAGNOSIS — Z888 Allergy status to other drugs, medicaments and biological substances status: Secondary | ICD-10-CM | POA: Diagnosis not present

## 2021-09-17 DIAGNOSIS — Z7901 Long term (current) use of anticoagulants: Secondary | ICD-10-CM | POA: Diagnosis not present

## 2021-09-17 DIAGNOSIS — D631 Anemia in chronic kidney disease: Secondary | ICD-10-CM | POA: Diagnosis present

## 2021-09-17 DIAGNOSIS — I5023 Acute on chronic systolic (congestive) heart failure: Secondary | ICD-10-CM | POA: Diagnosis present

## 2021-09-17 DIAGNOSIS — Z880 Allergy status to penicillin: Secondary | ICD-10-CM | POA: Diagnosis not present

## 2021-09-17 DIAGNOSIS — Z79899 Other long term (current) drug therapy: Secondary | ICD-10-CM | POA: Diagnosis not present

## 2021-09-17 DIAGNOSIS — I2510 Atherosclerotic heart disease of native coronary artery without angina pectoris: Secondary | ICD-10-CM | POA: Diagnosis present

## 2021-09-17 DIAGNOSIS — Z8249 Family history of ischemic heart disease and other diseases of the circulatory system: Secondary | ICD-10-CM | POA: Diagnosis not present

## 2021-09-17 DIAGNOSIS — Z85828 Personal history of other malignant neoplasm of skin: Secondary | ICD-10-CM | POA: Diagnosis not present

## 2021-09-17 DIAGNOSIS — I10 Essential (primary) hypertension: Secondary | ICD-10-CM

## 2021-09-17 DIAGNOSIS — I442 Atrioventricular block, complete: Secondary | ICD-10-CM | POA: Diagnosis present

## 2021-09-17 DIAGNOSIS — E1151 Type 2 diabetes mellitus with diabetic peripheral angiopathy without gangrene: Secondary | ICD-10-CM | POA: Diagnosis present

## 2021-09-17 DIAGNOSIS — Z8551 Personal history of malignant neoplasm of bladder: Secondary | ICD-10-CM | POA: Diagnosis not present

## 2021-09-17 DIAGNOSIS — N184 Chronic kidney disease, stage 4 (severe): Secondary | ICD-10-CM | POA: Diagnosis present

## 2021-09-17 DIAGNOSIS — Z808 Family history of malignant neoplasm of other organs or systems: Secondary | ICD-10-CM | POA: Diagnosis not present

## 2021-09-17 DIAGNOSIS — E1122 Type 2 diabetes mellitus with diabetic chronic kidney disease: Secondary | ICD-10-CM | POA: Diagnosis present

## 2021-09-17 DIAGNOSIS — Z823 Family history of stroke: Secondary | ICD-10-CM | POA: Diagnosis not present

## 2021-09-17 DIAGNOSIS — D509 Iron deficiency anemia, unspecified: Secondary | ICD-10-CM | POA: Diagnosis present

## 2021-09-17 DIAGNOSIS — Z951 Presence of aortocoronary bypass graft: Secondary | ICD-10-CM | POA: Diagnosis not present

## 2021-09-17 DIAGNOSIS — I130 Hypertensive heart and chronic kidney disease with heart failure and stage 1 through stage 4 chronic kidney disease, or unspecified chronic kidney disease: Secondary | ICD-10-CM | POA: Diagnosis present

## 2021-09-17 DIAGNOSIS — I480 Paroxysmal atrial fibrillation: Secondary | ICD-10-CM | POA: Diagnosis present

## 2021-09-17 DIAGNOSIS — Z66 Do not resuscitate: Secondary | ICD-10-CM | POA: Diagnosis not present

## 2021-09-17 DIAGNOSIS — I509 Heart failure, unspecified: Secondary | ICD-10-CM | POA: Diagnosis present

## 2021-09-17 DIAGNOSIS — Z20822 Contact with and (suspected) exposure to covid-19: Secondary | ICD-10-CM | POA: Diagnosis present

## 2021-09-17 DIAGNOSIS — E785 Hyperlipidemia, unspecified: Secondary | ICD-10-CM | POA: Diagnosis present

## 2021-09-17 LAB — CBC WITH DIFFERENTIAL/PLATELET
Abs Immature Granulocytes: 0.03 10*3/uL (ref 0.00–0.07)
Basophils Absolute: 0 10*3/uL (ref 0.0–0.1)
Basophils Relative: 0 %
Eosinophils Absolute: 0.1 10*3/uL (ref 0.0–0.5)
Eosinophils Relative: 2 %
HCT: 31.3 % — ABNORMAL LOW (ref 39.0–52.0)
Hemoglobin: 10.5 g/dL — ABNORMAL LOW (ref 13.0–17.0)
Immature Granulocytes: 1 %
Lymphocytes Relative: 19 %
Lymphs Abs: 1.3 10*3/uL (ref 0.7–4.0)
MCH: 30.9 pg (ref 26.0–34.0)
MCHC: 33.5 g/dL (ref 30.0–36.0)
MCV: 92.1 fL (ref 80.0–100.0)
Monocytes Absolute: 0.8 10*3/uL (ref 0.1–1.0)
Monocytes Relative: 13 %
Neutro Abs: 4.4 10*3/uL (ref 1.7–7.7)
Neutrophils Relative %: 65 %
Platelets: 149 10*3/uL — ABNORMAL LOW (ref 150–400)
RBC: 3.4 MIL/uL — ABNORMAL LOW (ref 4.22–5.81)
RDW: 15.2 % (ref 11.5–15.5)
WBC: 6.6 10*3/uL (ref 4.0–10.5)
nRBC: 0 % (ref 0.0–0.2)

## 2021-09-17 LAB — BASIC METABOLIC PANEL
Anion gap: 6 (ref 5–15)
BUN: 46 mg/dL — ABNORMAL HIGH (ref 8–23)
CO2: 25 mmol/L (ref 22–32)
Calcium: 8.6 mg/dL — ABNORMAL LOW (ref 8.9–10.3)
Chloride: 106 mmol/L (ref 98–111)
Creatinine, Ser: 2.03 mg/dL — ABNORMAL HIGH (ref 0.61–1.24)
GFR, Estimated: 30 mL/min — ABNORMAL LOW (ref >60)
Glucose, Bld: 125 mg/dL — ABNORMAL HIGH (ref 70–99)
Potassium: 4 mmol/L (ref 3.5–5.1)
Sodium: 137 mmol/L (ref 135–145)

## 2021-09-17 LAB — BRAIN NATRIURETIC PEPTIDE: B Natriuretic Peptide: 2003.5 pg/mL — ABNORMAL HIGH (ref 0.0–100.0)

## 2021-09-17 MED ORDER — ACETAMINOPHEN 650 MG RE SUPP
650.0000 mg | Freq: Four times a day (QID) | RECTAL | Status: DC | PRN
Start: 2021-09-17 — End: 2021-09-20

## 2021-09-17 MED ORDER — ALLOPURINOL 300 MG PO TABS
150.0000 mg | ORAL_TABLET | Freq: Every day | ORAL | Status: DC
  Administered 2021-09-17 – 2021-09-20 (×4): 150 mg via ORAL
  Filled 2021-09-17 (×4): qty 1

## 2021-09-17 MED ORDER — ONDANSETRON HCL 4 MG PO TABS: 4.0000 mg | ORAL_TABLET | Freq: Four times a day (QID) | ORAL | Status: DC | PRN

## 2021-09-17 MED ORDER — TAMSULOSIN HCL 0.4 MG PO CAPS
0.4000 mg | ORAL_CAPSULE | Freq: Every day | ORAL | Status: DC
  Administered 2021-09-17 – 2021-09-19 (×3): 0.4 mg via ORAL
  Filled 2021-09-17 (×3): qty 1

## 2021-09-17 MED ORDER — OMEGA-3-ACID ETHYL ESTERS 1 G PO CAPS
1.0000 g | ORAL_CAPSULE | Freq: Every day | ORAL | Status: DC
  Administered 2021-09-17 – 2021-09-20 (×4): 1 g via ORAL
  Filled 2021-09-17 (×6): qty 1

## 2021-09-17 MED ORDER — FINASTERIDE 5 MG PO TABS
5.0000 mg | ORAL_TABLET | Freq: Every day | ORAL | Status: DC
  Administered 2021-09-17 – 2021-09-20 (×4): 5 mg via ORAL
  Filled 2021-09-17 (×4): qty 1

## 2021-09-17 MED ORDER — ACETAMINOPHEN 325 MG PO TABS: 650.0000 mg | ORAL_TABLET | Freq: Four times a day (QID) | ORAL | Status: DC | PRN

## 2021-09-17 MED ORDER — LATANOPROST 0.005 % OP SOLN
1.0000 [drp] | Freq: Every day | OPHTHALMIC | Status: DC
  Administered 2021-09-17 – 2021-09-19 (×3): 1 [drp] via OPHTHALMIC
  Filled 2021-09-17: qty 2.5

## 2021-09-17 MED ORDER — ONDANSETRON HCL 4 MG/2ML IJ SOLN
4.0000 mg | Freq: Four times a day (QID) | INTRAMUSCULAR | Status: DC | PRN
Start: 2021-09-17 — End: 2021-09-20

## 2021-09-17 MED ORDER — FUROSEMIDE 10 MG/ML IJ SOLN
40.0000 mg | Freq: Two times a day (BID) | INTRAMUSCULAR | Status: DC
  Administered 2021-09-17 – 2021-09-19 (×4): 40 mg via INTRAVENOUS
  Filled 2021-09-17 (×4): qty 4

## 2021-09-17 NOTE — Assessment & Plan Note (Signed)
-  pacemaker- had CHB -continue eliquis

## 2021-09-17 NOTE — Assessment & Plan Note (Signed)
-  repeat echo -strict I/O -daily weights -last echo shows: EF 40-45%.  Per Dr. Johnsie Cancel GDMT limited by age and CRF.  continue medical Rx -IV lasix -daily labs -may benefit from GDMT that he can have-- CHF navigator consulted

## 2021-09-17 NOTE — Assessment & Plan Note (Signed)
-  resume meds as able with diuresis

## 2021-09-17 NOTE — Assessment & Plan Note (Signed)
statin

## 2021-09-17 NOTE — ED Notes (Signed)
[facility] Transport Team at bedside 

## 2021-09-17 NOTE — H&P (Signed)
History and Physical    Patient: [name] TGG:[id number] DOB: [birth year] DOA: 09/16/2021 DOS: the patient was seen and examined on 09/17/2021 PCP: [name], DO  Patient coming from: Home vs outside ER  Chief Complaint:  Chief Complaint  Patient presents with   Shortness of Breath    HPI: [name] is a 88 y.o. male with CHB s/p pacemaker, PAF on Eliquis, remote hx of CABG, HTN, bladder cancer s/p TURBT, erythropoietin deficiency and iron deficiency anemia who presented with increasing shortness of breath.   Had gained 6 lbs prior to may (had a [place]) and was able to lose that weight with doubled dose of lasix.  After his trip to [place] his PCP went back to normal dose of lasix but has since had worsening SOB and 15lb weight gain.    Chest x-ray showing small effusion and central congestion.  BNP of 1700s, troponin 24. Creatinine is also worsening on presentation at 2.47 from 2.14 about 3 weeks ago. Remains on room air.  Given IV 20 mg of Lasix in the ED with good UOP and he is feeling better (down 1.8L and 4kg if weight is correct)   Last seen by cardiology, Dr. Johnsie Cancel in 2/23: Discussed fact that EF worse, MR , afib all contribute to CHF. Given age, renal failure and lack of   Chest pain would not cath.  Having heaviness in both legs worried about claudication However [place] LEls 12/24/20 showed no significant obstructive PVD.  EF 40-45% GDMT limited by age and CRF better with daily lasix RV ok no pulmonary HTN and MR stable moderate continue medical Rx           Review of Systems: As mentioned in the history of present illness. All other systems reviewed and are negative. Past Medical History:  Diagnosis Date   A-fib [facility])    Basal cell carcinoma    skin   Bigeminal rhythm    Bradycardia 06/21/2017   Cancer of anterior wall of urinary bladder ([place]) 07/18/2019   Cataract    Chronic renal insufficiency, stage 3 (moderate) ([place]) 2018   GFR 30s-40s   Coronary  artery disease    post bypass   CVD (cardiovascular disease)    Diabetes mellitus without complication ([place])    type 2 diet controlled   Diverticular disease    Easy bruising    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   Gout    Hematuria 06/30/2019   Hernia    Hyperkalemia 05/06/2017   Hyperlipidemia    Hypertension    IBS (irritable bowel syndrome)    Iron deficiency anemia 02/04/2016   Macular degeneration    Microscopic colitis    PVD (peripheral vascular disease) ([place])    Past Surgical History:  Procedure Laterality Date   CARDIAC CATHETERIZATION  2006   CARDIOVERSION N/A 02/22/2013   Procedure: CARDIOVERSION;  Surgeon: [name], MD;  Location: [place];  Service: Cardiovascular;  Laterality: N/A;   CARDIOVERSION N/A 11/15/2014   Procedure: CARDIOVERSION;  Surgeon: [name], MD;  Location: [place];  Service: Cardiovascular;  Laterality: N/A;   CARDIOVERSION N/A 04/01/2017   Procedure: CARDIOVERSION;  Surgeon: Larey Dresser, MD;  Location: [place];  Service: Cardiovascular;  Laterality: N/A;   CAROTID ENDARTERECTOMY  2009/ 1993   left/ right   COLONOSCOPY  03/17/2005   The colon is normal.   CORONARY ARTERY BYPASS GRAFT  1999   CYSTOSCOPY W/ RETROGRADES Bilateral 07/14/2019  Procedure: CYSTOSCOPY WITH RETROGRADE PYELOGRAM;  Surgeon: Franchot Gallo, MD;  Location: [place];  Service: Urology;  Laterality: Bilateral;   ESOPHAGOGASTRODUODENOSCOPY  03/17/2005   Normal esophagus. Normal Stomanch. Normal duodenum.    EYE SURGERY     eyelid repair   INGUINAL HERNIA REPAIR  02/11/2012   Procedure: LAPAROSCOPIC BILATERAL INGUINAL HERNIA REPAIR;  Surgeon: Pedro Earls, MD;  Location: [facility];  Service: General;  Laterality: Bilateral;   PACEMAKER IMPLANT N/A 07/28/2017   [employer] Assurity MRI conditional  dual-chamber pacemaker for symptomatic second degree AV block by Dr Rayann Heman   SKIN CANCER EXCISION     right ear x 3    TRANSURETHRAL RESECTION OF BLADDER TUMOR N/A 09/05/2019   Procedure: TRANSURETHRAL RESECTION OF BLADDER TUMOR (TURBT);  Surgeon: Franchot Gallo, MD;  Location: [facility];  Service: Urology;  Laterality: N/A;   TRANSURETHRAL RESECTION OF BLADDER TUMOR WITH MITOMYCIN-C N/A 07/14/2019   Procedure: TRANSURETHRAL RESECTION OF BLADDER TUMOR WITH GEMCITABINE IN PACU;  Surgeon: Franchot Gallo, MD;  Location: [facility];  Service: Urology;  Laterality: N/A;   Social History:  reports that he has quit smoking. He quit smokeless tobacco use about 52 years ago.  His smokeless tobacco use included chew. He reports that he does not drink alcohol and does not use drugs.  Allergies  Allergen Reactions   Penicillins Hives    Has patient had a PCN reaction causing immediate rash, facial/tongue/throat swelling, SOB or lightheadedness with hypotension: No Has patient had a PCN reaction causing severe rash involving mucus membranes or skin necrosis: Yes Has patient had a PCN reaction that required hospitalization: No Has patient had a PCN reaction occurring within the last 10 years: No If all of the above answers are "NO", then may proceed with Cephalosporin use.    Vancomycin Other (See Comments)    Edema and myalgia    Family History  Problem Relation Age of Onset   Stroke Mother    Coronary artery disease Father    Heart disease Father    Melanoma Sister    Colon cancer Neg Hx     Prior to Admission medications   Medication Sig Start Date End Date Taking? Authorizing Provider  allopurinol (ZYLOPRIM) 300 MG tablet Take 0.5 tablets (150 mg total) by mouth daily. 02/11/21   Kuneff, Renee A, DO  amLODipine (NORVASC) 5 MG tablet TAKE 1 TABLET DAILY 08/12/21   Kuneff, Renee A, DO  apixaban (ELIQUIS) 2.5 MG TABS tablet TAKE 1 TABLET TWICE A DAY 02/21/21   [name], MD  atorvastatin (LIPITOR) 20 MG tablet Take 1 tablet (20 mg total) by mouth at bedtime. 02/11/21    Kuneff, Renee A, DO  carvedilol (COREG) 6.25 MG tablet Take 1 tablet (6.25 mg total) by mouth 2 (two) times daily with a meal. 02/11/21   Kuneff, Renee A, DO  Cholecalciferol 125 MCG (5000 UT) capsule Take 5,000 Units by mouth daily.    [provider]  dicyclomine (BENTYL) 10 MG capsule Take 1 capsule (10 mg total) by mouth 4 (four) times daily -  before meals and at bedtime. 02/11/21   Kuneff, Renee A, DO  diphenoxylate-atropine (LOMOTIL) 2.5-0.025 MG tablet 1 tablet. Pt takes PRN 08/23/18   [provider]  finasteride (PROSCAR) 5 MG tablet Take 5 mg by mouth daily. 08/24/17   [provider]  fish oil-omega-3 fatty acids 1000 MG capsule Take 1 g by mouth daily.  [provider]  furosemide (LASIX) 40 MG tablet Take 1 tablet (40 mg total) by mouth daily. 08/26/21   Kuneff, Renee A, DO  glucose blood (ONETOUCH VERIO) test strip Use as instructed 08/12/21   Raoul Pitch, Renee A, DO  Lancets (ONETOUCH ULTRASOFT) lancets Use as instructed 02/12/21   Kuneff, Renee A, DO  latanoprost (XALATAN) 0.005 % ophthalmic solution Place 1 drop into both eyes at bedtime. 07/14/18   [provider]  Multiple Vitamins-Minerals (PRESERVISION AREDS PO) Take 1 tablet by mouth 2 (two) times daily.     [provider]  potassium chloride (KLOR-CON) 10 MEQ tablet Take 1-2 tablets (10-20 mEq total) by mouth daily. 08/30/21   Kuneff, Renee A, DO  Probiotic Product (PROBIOTIC PO) Take 1 tablet by mouth daily.    [provider]  tamsulosin (FLOMAX) 0.4 MG CAPS capsule Take 0.4 mg by mouth.    [provider]  timolol (TIMOPTIC) 0.5 % ophthalmic solution Place 1 drop into both eyes 2 (two) times daily.  10/06/18   [provider]  UNABLE TO FIND Med Name: Ester Rink    [provider]  VANADYL SULFATE PO Take 1 capsule by mouth every evening.     [provider]    Physical Exam: Vitals:   09/17/21 1200 09/17/21 1345 09/17/21 1400  09/17/21 1538  BP: (!) 170/65  (!) 153/81 (!) 163/62  Pulse: (!) 59 (!) 59 63 67  Resp: (!) '22 14 18 13  '$ Temp:   98.2 F (36.8 C) 97.9 F (36.6 C)  TempSrc:   Oral Oral  SpO2: 93% 95% 94% 94%  Weight:    70.2 kg  Height:    '5\' 8"'$  (1.727 m)   Physical Exam Constitutional:      Appearance: He is well-developed.  HENT:     Head: Normocephalic and atraumatic.     Mouth/Throat:     Mouth: Mucous membranes are moist.  Cardiovascular:     Rate and Rhythm: Normal rate.  Pulmonary:     Effort: Pulmonary effort is normal.     Breath sounds: Examination of the right-lower field reveals decreased breath sounds. Examination of the left-lower field reveals decreased breath sounds. Decreased breath sounds present.  Abdominal:     General: Bowel sounds are normal.     Palpations: Abdomen is soft.  Musculoskeletal:     Right lower leg: Edema present.     Left lower leg: Edema present.  Skin:    General: Skin is warm and dry.  Neurological:     General: No focal deficit present.     Mental Status: He is alert and oriented to person, place, and time.  Psychiatric:        Mood and Affect: Mood normal.        Behavior: Behavior normal.    Data Reviewed: BNP elevated at 2000 Cr improve from yesterday after diuresis Hbg< 12  Assessment and Plan: * Acute exacerbation of CHF (congestive heart failure) (HCC) -repeat echo -strict I/O -daily weights -last echo shows: EF 40-45%.  Per Dr. Johnsie Cancel GDMT limited by age and CRF.  continue medical Rx -IV lasix -daily labs -may benefit from GDMT that he can have-- CHF navigator consulted  Hyperlipidemia LDL goal <70 -statin  Chronic kidney disease (CKD), stage III (moderate) (HCC) -Cr improving with IV diuresis -baseline: 1.8-2   A-fib (HCC) -pacemaker- had CHB -continue eliquis  Essential hypertension -resume meds as able with diuresis    From home with  wife-- has 2 children    Advance Care Planning:   Code Status: Full Code  discussed with patient   Consults: none    Severity of Illness: The appropriate patient status for this patient is INPATIENT. Inpatient status is judged to be reasonable and necessary in order to provide the required intensity of service to ensure the patient's safety. The patient's presenting symptoms, physical exam findings, and initial radiographic and laboratory data in the context of their chronic comorbidities is felt to place them at high risk for further clinical deterioration. Furthermore, it is not anticipated that the patient will be medically stable for discharge from the hospital within 2 midnights of admission.   * I certify that at the point of admission it is my clinical judgment that the patient will require inpatient hospital care spanning beyond 2 midnights from the point of admission due to high intensity of service, high risk for further deterioration and high frequency of surveillance required.*  Author: Geradine Girt, DO 09/17/2021 4:14 PM  For on call review [email or web address].

## 2021-09-17 NOTE — ED Notes (Signed)
RT ambulated patient with pulse ox. Went to restroom and then for a lap around the department. SAT 93-94%, HR 80. Stated he was slightly SOB and "wobbly". MD aware

## 2021-09-17 NOTE — Assessment & Plan Note (Signed)
-  Cr improving with IV diuresis -baseline: 1.8-2

## 2021-09-17 NOTE — ED Notes (Signed)
[facility] in ED at this time.

## 2021-09-17 NOTE — ED Notes (Signed)
Pt awaiting inpatient bed placement.  Provided breakfast, offered toileting, denies pain

## 2021-09-18 ENCOUNTER — Encounter: Payer: Self-pay | Admitting: Hematology & Oncology

## 2021-09-18 ENCOUNTER — Inpatient Hospital Stay (HOSPITAL_COMMUNITY): Payer: Medicare Other

## 2021-09-18 ENCOUNTER — Other Ambulatory Visit (HOSPITAL_COMMUNITY): Payer: Self-pay

## 2021-09-18 DIAGNOSIS — I5023 Acute on chronic systolic (congestive) heart failure: Secondary | ICD-10-CM

## 2021-09-18 DIAGNOSIS — I509 Heart failure, unspecified: Secondary | ICD-10-CM | POA: Diagnosis not present

## 2021-09-18 LAB — ECHOCARDIOGRAM COMPLETE
AR max vel: 2.31 cm2
AV Peak grad: 4.8 mmHg
Ao pk vel: 1.09 m/s
Area-P 1/2: 4.71 cm2
Calc EF: 43.5 %
Height: 68 in
MV M vel: 4.43 m/s
MV Peak grad: 78.5 mmHg
S' Lateral: 3.2 cm
Single Plane A2C EF: 45 %
Single Plane A4C EF: 41.3 %
Weight: 2440.93 oz

## 2021-09-18 LAB — BRAIN NATRIURETIC PEPTIDE: B Natriuretic Peptide: 1507.3 pg/mL — ABNORMAL HIGH (ref 0.0–100.0)

## 2021-09-18 LAB — BASIC METABOLIC PANEL
Anion gap: 11 (ref 5–15)
BUN: 49 mg/dL — ABNORMAL HIGH (ref 8–23)
CO2: 22 mmol/L (ref 22–32)
Calcium: 9 mg/dL (ref 8.9–10.3)
Chloride: 107 mmol/L (ref 98–111)
Creatinine, Ser: 2.24 mg/dL — ABNORMAL HIGH (ref 0.61–1.24)
GFR, Estimated: 27 mL/min — ABNORMAL LOW (ref >60)
Glucose, Bld: 98 mg/dL (ref 70–99)
Potassium: 4 mmol/L (ref 3.5–5.1)
Sodium: 140 mmol/L (ref 135–145)

## 2021-09-18 LAB — IRON AND TIBC
Iron: 24 ug/dL — ABNORMAL LOW (ref 45–182)
Saturation Ratios: 9 % — ABNORMAL LOW (ref 17.9–39.5)
TIBC: 255 ug/dL (ref 250–450)
UIBC: 231 ug/dL

## 2021-09-18 LAB — FERRITIN: Ferritin: 261 ng/mL (ref 24–336)

## 2021-09-18 MED ORDER — SODIUM CHLORIDE 0.9 % IV SOLN
510.0000 mg | Freq: Once | INTRAVENOUS | Status: AC
  Administered 2021-09-18: 510 mg via INTRAVENOUS
  Filled 2021-09-18 (×2): qty 17

## 2021-09-18 NOTE — Progress Notes (Signed)
Mobility Specialist Progress Note:   09/18/21 1144  Mobility  Activity Ambulated with assistance in hallway  Level of Assistance Independent after set-up  Assistive Device None  Distance Ambulated (ft) 550 ft  Activity Response Tolerated well  $Mobility charge 1 Mobility   Pt received in bed willing to participate in mobility. No complaints of pain. Left in bed with call bell in reach and all needs met.   [facility] Public librarian Phone [phone]

## 2021-09-18 NOTE — TOC Benefit Eligibility Note (Signed)
Patient Advocate Encounter  Prior Authorization for Jardiance '10MG'$  tablets has been approved.    PA# [id number] Effective dates: 08/19/2021 through 09/18/2022  Patients co-pay is $71.41.     Lyndel Safe, [place] Patient Advocate Specialist [place] Patient Advocate Team Direct Number: [phone]  Fax: [phone]

## 2021-09-18 NOTE — Progress Notes (Signed)
PROGRESS NOTE    [name]  EGB:[id number] DOB: July 17, 1928 DOA: 09/16/2021 PCP: Howard Pouch A, DO  89/M with history of chronic systolic CHF EF 37-10%, CHB status post pacer, paroxysmal A-fib on Coumadin, remote CABG, bladder cancer status post TURBT, CKD 4, anemia of chronic disease and iron deficiency, presented to the ED with shortness of breath, weight gain. -Chest x-ray noted small effusion, pulmonary vascular congestion, BNP 1700, creatinine was 2.4   Subjective: Feels better, breathing is improving  Assessment and Plan:  Acute on chronic systolic CHF (congestive heart failure) ([place]) -last echo shows: EF 40-45%.  Cards clinic note reviewed, ischemic evaluation not pursued on account of age and CKD  -Continue IV Lasix 40 Mg every 12, continue carvedilol -GDMT limited by CKD -Monitor I's/O, daily weights, will benefit from [facility] follow[facility]up  Chronic kidney disease 3B-4 -Cardiorenal in the setting of above -baseline: 1.8-2   A-fib ([place]) -pacemaker- had CHB -continue eliquis  Essential hypertension -Remains on amlodipine  Anemia of chronic disease Iron deficiency anemia -Hemoglobin relatively stable, followed by Dr. Marin Olp at the cancer center, will give IV iron   Hyperlipidemia LDL goal <70 -statin  DVT prophylaxis: Eliquis Code Status: Full code Family Communication: Discussed with patient detail, no family at bedside Disposition Plan: Home in 1 to 2 days  Consultants:    Procedures:   Antimicrobials:    Objective: Vitals:   09/17/21 1919 09/18/21 0042 09/18/21 0402 09/18/21 0754  BP: (!) 170/61 (!) 157/59 (!) 153/64 (!) 149/46  Pulse: 60 62 62 65  Resp: '19 14 18 18  '$ Temp: 97.8 F (36.6 C) 98.2 F (36.8 C) 97.6 F (36.4 C) 98.1 F (36.7 C)  TempSrc: Oral Oral Oral Oral  SpO2: 95% 92% 94% 93%  Weight:  69.2 kg    Height:        Intake/Output Summary (Last 24 hours) at 09/18/2021 1122 Last data filed at 09/18/2021 1056 Gross per 24 hour   Intake 300 ml  Output 1325 ml  Net -1025 ml   Filed Weights   09/16/21 1636 09/17/21 1538 09/18/21 0042  Weight: 74.8 kg 70.2 kg 69.2 kg    Examination:  General exam: Chronically ill elderly male sitting up in bed, AAOx3, no distress HEENT: Positive JVD CVS: S1-S2, regular rhythm Lungs: Basilar rales otherwise clear Abdomen: Soft, nontender, bowel sounds present Extremities: 1+ edema  Skin: No rashes on exposed skin Psychiatry:  Mood & affect appropriate.     Data Reviewed:   CBC: Recent Labs  Lab 09/16/21 1643 09/17/21 1400  WBC 6.8 6.6  NEUTROABS  --  4.4  HGB 10.7* 10.5*  HCT 32.8* 31.3*  MCV 92.9 92.1  PLT 138* 626*   Basic Metabolic Panel: Recent Labs  Lab 09/16/21 1643 09/17/21 1400 09/18/21 0337  NA 140 137 140  K 4.3 4.0 4.0  CL 106 106 107  CO2 21* 25 22  GLUCOSE 122* 125* 98  BUN 52* 46* 49*  CREATININE 2.47* 2.03* 2.24*  CALCIUM 9.1 8.6* 9.0   GFR: Estimated Creatinine Clearance: 20.8 mL/min (A) (by C-G formula based on SCr of 2.24 mg/dL (H)). Liver Function Tests: Recent Labs  Lab 09/16/21 1701  AST 25  ALT 19  ALKPHOS 85  BILITOT 0.7  PROT 7.2  ALBUMIN 3.7   No results for input(s): LIPASE, AMYLASE in the last 168 hours. No results for input(s): AMMONIA in the last 168 hours. Coagulation Profile: No results for input(s): INR, PROTIME in the last 168 hours.  Cardiac Enzymes: No results for input(s): CKTOTAL, CKMB, CKMBINDEX, TROPONINI in the last 168 hours. BNP (last 3 results) Recent Labs    08/20/21 1020 08/23/21 1041  PROBNP 1,616.0* 1,629.0*   HbA1C: No results for input(s): HGBA1C in the last 72 hours. CBG: No results for input(s): GLUCAP in the last 168 hours. Lipid Profile: No results for input(s): CHOL, HDL, LDLCALC, TRIG, CHOLHDL, LDLDIRECT in the last 72 hours. Thyroid Function Tests: No results for input(s): TSH, T4TOTAL, FREET4, T3FREE, THYROIDAB in the last 72 hours. Anemia Panel: Recent Labs     09/18/21 0337  FERRITIN 261  TIBC 255  IRON 24*   Urine analysis:    Component Value Date/Time   COLORURINE ORANGE (A) 08/16/2017 0936   APPEARANCEUR HAZY (A) 08/16/2017 0936   LABSPEC 1.010 08/16/2017 0936   PHURINE 5.5 08/16/2017 0936   GLUCOSEU NEGATIVE 08/16/2017 0936   HGBUR TRACE (A) 08/16/2017 0936   BILIRUBINUR NEGATIVE 08/16/2017 0936   KETONESUR NEGATIVE 08/16/2017 0936   PROTEINUR 100 (A) 08/16/2017 0936   UROBILINOGEN 0.2 10/21/2008 2146   NITRITE POSITIVE (A) 08/16/2017 0936   LEUKOCYTESUR SMALL (A) 08/16/2017 0936   Sepsis Labs: '@LABRCNTIP'$ (procalcitonin:4,lacticidven:4)  ) Recent Results (from the past 240 hour(s))  Resp Panel by RT-PCR (Flu A&B, Covid) Nasopharyngeal Swab     Status: None   Collection Time: 09/16/21  5:57 PM   Specimen: Nasopharyngeal Swab; Nasopharyngeal(NP) swabs in vial transport medium  Result Value Ref Range Status   SARS Coronavirus 2 by RT PCR NEGATIVE NEGATIVE Final    Comment: (NOTE) SARS-CoV-2 target nucleic acids are NOT DETECTED.  The SARS-CoV-2 RNA is generally detectable in upper respiratory specimens during the acute phase of infection. The lowest concentration of SARS-CoV-2 viral copies this assay can detect is 138 copies/mL. A negative result does not preclude SARS-Cov-2 infection and should not be used as the sole basis for treatment or other patient management decisions. A negative result may occur with  improper specimen collection/handling, submission of specimen other than nasopharyngeal swab, presence of viral mutation(s) within the areas targeted by this assay, and inadequate number of viral copies(<138 copies/mL). A negative result must be combined with clinical observations, patient history, and epidemiological information. The expected result is Negative.  Fact Sheet for Patients:  EntrepreneurPulse.com.au  Fact Sheet for Healthcare Providers:   IncredibleEmployment.be  This test is no t yet approved or cleared by the [place] FDA and  has been authorized for detection and/or diagnosis of SARS-CoV-2 by FDA under an Emergency Use Authorization (EUA). This EUA will remain  in effect (meaning this test can be used) for the duration of the COVID-19 declaration under Section 564(b)(1) of the Act, 21 U.S.C.section 360bbb-3(b)(1), unless the authorization is terminated  or revoked sooner.       Influenza A by PCR NEGATIVE NEGATIVE Final   Influenza B by PCR NEGATIVE NEGATIVE Final    Comment: (NOTE) The Xpert Xpress SARS-CoV-2/FLU/RSV plus assay is intended as an aid in the diagnosis of influenza from Nasopharyngeal swab specimens and should not be used as a sole basis for treatment. Nasal washings and aspirates are unacceptable for Xpert Xpress SARS-CoV-2/FLU/RSV testing.  Fact Sheet for Patients: EntrepreneurPulse.com.au  Fact Sheet for Healthcare Providers: IncredibleEmployment.be  This test is not yet approved or cleared by the [place] FDA and has been authorized for detection and/or diagnosis of SARS-CoV-2 by FDA under an Emergency Use Authorization (EUA). This EUA will remain in effect (meaning this test can be used) for the  duration of the COVID-19 declaration under Section 564(b)(1) of the Act, 21 U.S.C. section 360bbb-3(b)(1), unless the authorization is terminated or revoked.  Performed at [facility], [address] [place], [place] [address]      Radiology Studies: DG Chest 2 View  Result Date: 09/16/2021 CLINICAL DATA:  Shortness of breath EXAM: CHEST - 2 VIEW COMPARISON:  11/12/2020 FINDINGS: Post sternotomy changes. Left-sided pacing device. Small bilateral effusions. Borderline cardiac size. Aortic atherosclerosis. No pneumothorax. IMPRESSION: Borderline cardiac size with small bilateral effusions and mild central congestion.  Electronically Signed   By: Donavan Foil M.D.   On: 09/16/2021 17:16     Scheduled Meds:  allopurinol  150 mg Oral Daily   amLODipine  5 mg Oral Daily   apixaban  2.5 mg Oral BID   atorvastatin  20 mg Oral QHS   carvedilol  6.25 mg Oral BID WC   finasteride  5 mg Oral Daily   furosemide  40 mg Intravenous Q12H   latanoprost  1 drop Both Eyes QHS   omega-3 acid ethyl esters  1 g Oral Daily   tamsulosin  0.4 mg Oral QPC supper   timolol  1 drop Both Eyes BID   Continuous Infusions:   LOS: 1 day    Time spent: 36mn  PDomenic Polite MD [facility]   09/18/2021, 11:22 AM

## 2021-09-18 NOTE — Plan of Care (Signed)
  Problem: Education: Goal: Knowledge of General Education information will improve Description: Including pain rating scale, medication(s)/side effects and non-pharmacologic comfort measures Outcome: Progressing   Problem: Health Behavior/Discharge Planning: Goal: Ability to manage health-related needs will improve Outcome: Progressing   Problem: Clinical Measurements: Goal: Ability to maintain clinical measurements within normal limits will improve Outcome: Progressing Goal: Will remain free from infection Outcome: Progressing Goal: Respiratory complications will improve Outcome: Progressing   Problem: Activity: Goal: Risk for activity intolerance will decrease Outcome: Progressing   Problem: Safety: Goal: Ability to remain free from injury will improve Outcome: Progressing   Problem: Education: Goal: Ability to demonstrate management of disease process will improve Outcome: Progressing   Problem: Activity: Goal: Capacity to carry out activities will improve Outcome: Progressing   Problem: Cardiac: Goal: Ability to achieve and maintain adequate cardiopulmonary perfusion will improve Outcome: Progressing

## 2021-09-18 NOTE — TOC Benefit Eligibility Note (Signed)
Patient Teacher, English as a foreign language completed.    The patient is currently admitted and upon discharge could be taking Jardiance 10 mg.  Prior Authorization Required  The patient is insured through [employer] ([place] is the only [facility] pharmacy that takes this insurance)    Lyndel Safe, [place] Patient Advocate Specialist [place] Patient Advocate Team Direct Number: [phone]  Fax: [phone]

## 2021-09-19 DIAGNOSIS — I509 Heart failure, unspecified: Secondary | ICD-10-CM | POA: Diagnosis not present

## 2021-09-19 DIAGNOSIS — N1832 Chronic kidney disease, stage 3b: Secondary | ICD-10-CM | POA: Diagnosis not present

## 2021-09-19 LAB — BASIC METABOLIC PANEL
Anion gap: 7 (ref 5–15)
BUN: 53 mg/dL — ABNORMAL HIGH (ref 8–23)
CO2: 24 mmol/L (ref 22–32)
Calcium: 8.7 mg/dL — ABNORMAL LOW (ref 8.9–10.3)
Chloride: 105 mmol/L (ref 98–111)
Creatinine, Ser: 2.18 mg/dL — ABNORMAL HIGH (ref 0.61–1.24)
GFR, Estimated: 28 mL/min — ABNORMAL LOW (ref >60)
Glucose, Bld: 105 mg/dL — ABNORMAL HIGH (ref 70–99)
Potassium: 3.6 mmol/L (ref 3.5–5.1)
Sodium: 136 mmol/L (ref 135–145)

## 2021-09-19 LAB — CBC
HCT: 27.8 % — ABNORMAL LOW (ref 39.0–52.0)
Hemoglobin: 9.5 g/dL — ABNORMAL LOW (ref 13.0–17.0)
MCH: 30.7 pg (ref 26.0–34.0)
MCHC: 34.2 g/dL (ref 30.0–36.0)
MCV: 90 fL (ref 80.0–100.0)
Platelets: 139 10*3/uL — ABNORMAL LOW (ref 150–400)
RBC: 3.09 MIL/uL — ABNORMAL LOW (ref 4.22–5.81)
RDW: 14.8 % (ref 11.5–15.5)
WBC: 6.2 10*3/uL (ref 4.0–10.5)
nRBC: 0 % (ref 0.0–0.2)

## 2021-09-19 MED ORDER — POTASSIUM CHLORIDE CRYS ER 20 MEQ PO TBCR
40.0000 meq | EXTENDED_RELEASE_TABLET | Freq: Once | ORAL | Status: AC
  Administered 2021-09-19: 40 meq via ORAL
  Filled 2021-09-19: qty 2

## 2021-09-19 MED ORDER — FUROSEMIDE 40 MG PO TABS
40.0000 mg | ORAL_TABLET | Freq: Two times a day (BID) | ORAL | Status: DC
  Administered 2021-09-19 – 2021-09-20 (×2): 40 mg via ORAL
  Filled 2021-09-19 (×3): qty 1

## 2021-09-19 MED ORDER — DARBEPOETIN ALFA 300 MCG/0.6ML IJ SOSY
300.0000 ug | PREFILLED_SYRINGE | Freq: Once | INTRAMUSCULAR | Status: AC
  Administered 2021-09-19: 300 ug via SUBCUTANEOUS
  Filled 2021-09-19: qty 0.6

## 2021-09-19 NOTE — Progress Notes (Signed)
PROGRESS NOTE    [name]  BJS:[id number] DOB: 1928-07-10 DOA: 09/16/2021 PCP: Howard Pouch A, DO  88/M with history of chronic systolic CHF EF 60-73%, CHB status post pacer, paroxysmal A-fib on Coumadin, remote CABG, bladder cancer status post TURBT, CKD 4, anemia of chronic disease and iron deficiency, presented to the ED with shortness of breath, weight gain. -Chest x-ray noted small effusion, pulmonary vascular congestion, BNP 1700, creatinine was 2.4   Subjective: Feels much better overall, breathing getting closer to baseline  Assessment and Plan:  Acute on chronic systolic CHF (congestive heart failure) ([place]) -last echo shows: EF 40-45%.  Cards clinic note reviewed, ischemic evaluation not pursued on account of age and CKD  -Improving with diuresis he is 3.5 L negative -Switch to oral Lasix today -GDMT limited by CKD -Increase activity, discharge planning, home tomorrow if stable  Chronic right leg edema -Following vein harvesting for CABG  Chronic kidney disease 3B-4 -Cardiorenal in the setting of above -baseline: 1.8-2   A-fib ([place]) -pacemaker- had CHB -continue eliquis  Essential hypertension -Remains on amlodipine  Anemia of chronic disease Iron deficiency anemia -Hemoglobin relatively stable, followed by Dr. Marin Olp at the cancer center, given IV iron yesterday  Hyperlipidemia LDL goal <70 -statin  DVT prophylaxis: Eliquis Code Status: Discussed CODE STATUS with the patient he wishes to be a DNR Family Communication: Discussed with patient detail, no family at bedside Disposition Plan: Home tomorrow if stable  Consultants:    Procedures:   Antimicrobials:    Objective: Vitals:   09/18/21 2004 09/18/21 2005 09/19/21 0434 09/19/21 0734  BP:  (!) 158/64 (!) 157/58 (!) 150/102  Pulse:  63 63 62  Resp:  '17 15 19  '$ Temp:  98.4 F (36.9 C) 97.6 F (36.4 C)   TempSrc:  Oral Oral   SpO2: 96% 96% 93% 100%  Weight:   67.6 kg   Height:         Intake/Output Summary (Last 24 hours) at 09/19/2021 1131 Last data filed at 09/19/2021 7106 Gross per 24 hour  Intake 720 ml  Output 2000 ml  Net -1280 ml   Filed Weights   09/17/21 1538 09/18/21 0042 09/19/21 0434  Weight: 70.2 kg 69.2 kg 67.6 kg    Examination:  General exam: Elderly chronically ill male sitting up in bed, AAOx3, no distress HEENT: Positive JVD CVS: S1-S2, regular rhythm Lungs: Rare basilar rales otherwise clear Abdomen: Soft, nontender, bowel sounds present Extremities: No edema in left leg, chronic 1+ edema in right leg  Skin: No rashes on exposed skin Psychiatry:  Mood & affect appropriate.     Data Reviewed:   CBC: Recent Labs  Lab 09/16/21 1643 09/17/21 1400 09/19/21 0405  WBC 6.8 6.6 6.2  NEUTROABS  --  4.4  --   HGB 10.7* 10.5* 9.5*  HCT 32.8* 31.3* 27.8*  MCV 92.9 92.1 90.0  PLT 138* 149* 269*   Basic Metabolic Panel: Recent Labs  Lab 09/16/21 1643 09/17/21 1400 09/18/21 0337 09/19/21 0405  NA 140 137 140 136  K 4.3 4.0 4.0 3.6  CL 106 106 107 105  CO2 21* '25 22 24  '$ GLUCOSE 122* 125* 98 105*  BUN 52* 46* 49* 53*  CREATININE 2.47* 2.03* 2.24* 2.18*  CALCIUM 9.1 8.6* 9.0 8.7*   GFR: Estimated Creatinine Clearance: 21.1 mL/min (A) (by C-G formula based on SCr of 2.18 mg/dL (H)). Liver Function Tests: Recent Labs  Lab 09/16/21 1701  AST 25  ALT 19  ALKPHOS 85  BILITOT 0.7  PROT 7.2  ALBUMIN 3.7   No results for input(s): LIPASE, AMYLASE in the last 168 hours. No results for input(s): AMMONIA in the last 168 hours. Coagulation Profile: No results for input(s): INR, PROTIME in the last 168 hours. Cardiac Enzymes: No results for input(s): CKTOTAL, CKMB, CKMBINDEX, TROPONINI in the last 168 hours. BNP (last 3 results) Recent Labs    08/20/21 1020 08/23/21 1041  PROBNP 1,616.0* 1,629.0*   HbA1C: No results for input(s): HGBA1C in the last 72 hours. CBG: No results for input(s): GLUCAP in the last 168  hours. Lipid Profile: No results for input(s): CHOL, HDL, LDLCALC, TRIG, CHOLHDL, LDLDIRECT in the last 72 hours. Thyroid Function Tests: No results for input(s): TSH, T4TOTAL, FREET4, T3FREE, THYROIDAB in the last 72 hours. Anemia Panel: Recent Labs    09/18/21 0337  FERRITIN 261  TIBC 255  IRON 24*   Urine analysis:    Component Value Date/Time   COLORURINE ORANGE (A) 08/16/2017 0936   APPEARANCEUR HAZY (A) 08/16/2017 0936   LABSPEC 1.010 08/16/2017 0936   PHURINE 5.5 08/16/2017 0936   GLUCOSEU NEGATIVE 08/16/2017 0936   HGBUR TRACE (A) 08/16/2017 0936   BILIRUBINUR NEGATIVE 08/16/2017 0936   KETONESUR NEGATIVE 08/16/2017 0936   PROTEINUR 100 (A) 08/16/2017 0936   UROBILINOGEN 0.2 10/21/2008 2146   NITRITE POSITIVE (A) 08/16/2017 0936   LEUKOCYTESUR SMALL (A) 08/16/2017 0936   Sepsis Labs: '@LABRCNTIP'$ (procalcitonin:4,lacticidven:4)  ) Recent Results (from the past 240 hour(s))  Resp Panel by RT-PCR (Flu A&B, Covid) Nasopharyngeal Swab     Status: None   Collection Time: 09/16/21  5:57 PM   Specimen: Nasopharyngeal Swab; Nasopharyngeal(NP) swabs in vial transport medium  Result Value Ref Range Status   SARS Coronavirus 2 by RT PCR NEGATIVE NEGATIVE Final    Comment: (NOTE) SARS-CoV-2 target nucleic acids are NOT DETECTED.  The SARS-CoV-2 RNA is generally detectable in upper respiratory specimens during the acute phase of infection. The lowest concentration of SARS-CoV-2 viral copies this assay can detect is 138 copies/mL. A negative result does not preclude SARS-Cov-2 infection and should not be used as the sole basis for treatment or other patient management decisions. A negative result may occur with  improper specimen collection/handling, submission of specimen other than nasopharyngeal swab, presence of viral mutation(s) within the areas targeted by this assay, and inadequate number of viral copies(<138 copies/mL). A negative result must be combined  with clinical observations, patient history, and epidemiological information. The expected result is Negative.  Fact Sheet for Patients:  EntrepreneurPulse.com.au  Fact Sheet for Healthcare Providers:  IncredibleEmployment.be  This test is no t yet approved or cleared by the [place] FDA and  has been authorized for detection and/or diagnosis of SARS-CoV-2 by FDA under an Emergency Use Authorization (EUA). This EUA will remain  in effect (meaning this test can be used) for the duration of the COVID-19 declaration under Section 564(b)(1) of the Act, 21 U.S.C.section 360bbb-3(b)(1), unless the authorization is terminated  or revoked sooner.       Influenza A by PCR NEGATIVE NEGATIVE Final   Influenza B by PCR NEGATIVE NEGATIVE Final    Comment: (NOTE) The Xpert Xpress SARS-CoV-2/FLU/RSV plus assay is intended as an aid in the diagnosis of influenza from Nasopharyngeal swab specimens and should not be used as a sole basis for treatment. Nasal washings and aspirates are unacceptable for Xpert Xpress SARS-CoV-2/FLU/RSV testing.  Fact Sheet for Patients: EntrepreneurPulse.com.au  Fact Sheet for Healthcare Providers:  IncredibleEmployment.be  This test is not yet approved or cleared by the [place] and has been authorized for detection and/or diagnosis of SARS-CoV-2 by FDA under an Emergency Use Authorization (EUA). This EUA will remain in effect (meaning this test can be used) for the duration of the COVID-19 declaration under Section 564(b)(1) of the Act, 21 U.S.C. section 360bbb-3(b)(1), unless the authorization is terminated or revoked.  Performed at [facility], [address] [place], [place] [address]      Radiology Studies: ECHOCARDIOGRAM COMPLETE  Result Date: 09/18/2021    ECHOCARDIOGRAM REPORT   Patient Name:   MOHSEN ODENTHAL Date of Exam: 09/18/2021 Medical Rec #:   [id number]        Height:       68.0 in Accession #:    [id number]       Weight:       152.6 lb Date of Birth:  [birth year]         BSA:          1.822 m Patient Age:    55 years         BP:           153/64 mmHg Patient Gender: M                HR:           63 bpm. Exam Location:  Inpatient Procedure: 2D Echo, Cardiac Doppler and Color Doppler Indications:    CHF  History:        Patient has prior history of Echocardiogram examinations. CHF,                 CAD, Arrythmias:Atrial Fibrillation; Risk Factors:Hypertension.  Sonographer:    Jyl Heinz Referring Phys: [place]  1. Left ventricular ejection fraction, by estimation, is 40 to 45%. The left ventricle has mildly decreased function. The left ventricle demonstrates global hypokinesis with septal-lateral dyssynchrony consistent with RV pacing. There is mild left ventricular hypertrophy. Left ventricular diastolic parameters are indeterminate.  2. Right ventricular systolic function is mildly reduced. The right ventricular size is normal. There is moderately elevated pulmonary artery systolic pressure. The estimated right ventricular systolic pressure is 62.6 mmHg.  3. Left atrial size was moderately dilated.  4. Right atrial size was mildly dilated.  5. The mitral valve is normal in structure. Mild mitral valve regurgitation. No evidence of mitral stenosis.  6. The aortic valve is tricuspid. Aortic valve regurgitation is trivial. No aortic stenosis is present.  7. The inferior vena cava is dilated in size with <50% respiratory variability, suggesting right atrial pressure of 15 mmHg. FINDINGS  Left Ventricle: Left ventricular ejection fraction, by estimation, is 40 to 45%. The left ventricle has mildly decreased function. The left ventricle demonstrates global hypokinesis. The left ventricular internal cavity size was normal in size. There is  mild left ventricular hypertrophy. Left ventricular diastolic parameters are indeterminate.  Right Ventricle: The right ventricular size is normal. No increase in right ventricular wall thickness. Right ventricular systolic function is mildly reduced. There is moderately elevated pulmonary artery systolic pressure. The tricuspid regurgitant velocity is 2.87 m/s, and with an assumed right atrial pressure of 15 mmHg, the estimated right ventricular systolic pressure is 94.8 mmHg. Left Atrium: Left atrial size was moderately dilated. Right Atrium: Right atrial size was mildly dilated. Pericardium: There is no evidence of pericardial effusion. Mitral Valve: The mitral valve is normal in structure. There  is mild calcification of the mitral valve leaflet(s). Mild mitral annular calcification. Mild mitral valve regurgitation. No evidence of mitral valve stenosis. Tricuspid Valve: The tricuspid valve is normal in structure. Tricuspid valve regurgitation is mild. Aortic Valve: The aortic valve is tricuspid. Aortic valve regurgitation is trivial. No aortic stenosis is present. Aortic valve peak gradient measures 4.8 mmHg. Pulmonic Valve: The pulmonic valve was normal in structure. Pulmonic valve regurgitation is trivial. Aorta: The aortic root is normal in size and structure. Venous: The inferior vena cava is dilated in size with less than 50% respiratory variability, suggesting right atrial pressure of 15 mmHg. IAS/Shunts: The interatrial septum was not well visualized. Additional Comments: A device lead is visualized in the right ventricle.  LEFT VENTRICLE PLAX 2D LVIDd:         4.60 cm      Diastology LVIDs:         3.20 cm      LV e' medial:    4.21 cm/s LV PW:         1.40 cm      LV E/e' medial:  32.1 LV IVS:        1.20 cm      LV e' lateral:   8.50 cm/s LVOT diam:     2.00 cm      LV E/e' lateral: 15.9 LV SV:         50 LV SV Index:   27 LVOT Area:     3.14 cm                              3D Volume EF: LV Volumes (MOD)            3D EF:        51 % LV vol d, MOD A2C: 159.0 ml LV EDV:       170 ml LV vol d,  MOD A4C: 119.0 ml LV ESV:       83 ml LV vol s, MOD A2C: 87.5 ml  LV SV:        87 ml LV vol s, MOD A4C: 69.8 ml LV SV MOD A2C:     71.5 ml LV SV MOD A4C:     119.0 ml LV SV MOD BP:      60.0 ml RIGHT VENTRICLE            IVC RV Basal diam:  4.10 cm    IVC diam: 2.40 cm RV Mid diam:    2.80 cm RV S prime:     9.46 cm/s TAPSE (M-mode): 1.5 cm LEFT ATRIUM           Index        RIGHT ATRIUM           Index LA diam:      4.40 cm 2.42 cm/m   RA Area:     25.00 cm LA Vol (A2C): 91.2 ml 50.07 ml/m  RA Volume:   84.30 ml  46.28 ml/m LA Vol (A4C): 33.6 ml 18.45 ml/m  AORTIC VALVE AV Area (Vmax): 2.31 cm AV Vmax:        109.00 cm/s AV Peak Grad:   4.8 mmHg LVOT Vmax:      80.00 cm/s LVOT Vmean:     57.700 cm/s LVOT VTI:       0.158 m  AORTA Ao Root diam: 3.60 cm Ao Asc diam:  3.20 cm MITRAL  VALVE                TRICUSPID VALVE MV Area (PHT): 4.71 cm     TR Peak grad:   32.9 mmHg MV Decel Time: 161 msec     TR Vmax:        287.00 cm/s MR Peak grad: 78.5 mmHg MR Vmax:      443.00 cm/s   SHUNTS MV E velocity: 135.00 cm/s  Systemic VTI:  0.16 m MV A velocity: 63.60 cm/s   Systemic Diam: 2.00 cm MV E/A ratio:  2.12 [place] McleanMD Electronically signed by Franki Monte Signature Date/Time: 09/18/2021/3:31:42 PM    Final      Scheduled Meds:  allopurinol  150 mg Oral Daily   amLODipine  5 mg Oral Daily   apixaban  2.5 mg Oral BID   atorvastatin  20 mg Oral QHS   carvedilol  6.25 mg Oral BID WC   darbepoetin (ARANESP) injection - NON-DIALYSIS  300 mcg Subcutaneous Once   finasteride  5 mg Oral Daily   furosemide  40 mg Oral BID   latanoprost  1 drop Both Eyes QHS   omega-3 acid ethyl esters  1 g Oral Daily   potassium chloride  40 mEq Oral Once   tamsulosin  0.4 mg Oral QPC supper   timolol  1 drop Both Eyes BID   Continuous Infusions:   LOS: 2 days    Time spent: 54mn  PDomenic Polite MD [facility]   09/19/2021, 11:31 AM

## 2021-09-19 NOTE — Plan of Care (Signed)
  Problem: Education: Goal: Knowledge of General Education information will improve Description: Including pain rating scale, medication(s)/side effects and non-pharmacologic comfort measures Outcome: Progressing   Problem: Nutrition: Goal: Adequate nutrition will be maintained Outcome: Progressing   Problem: Coping: Goal: Level of anxiety will decrease Outcome: Progressing   Problem: Safety: Goal: Ability to remain free from injury will improve Outcome: Progressing   Problem: Skin Integrity: Goal: Risk for impaired skin integrity will decrease Outcome: Progressing   Problem: Education: Goal: Ability to demonstrate management of disease process will improve Outcome: Progressing   Problem: Activity: Goal: Capacity to carry out activities will improve Outcome: Progressing   Problem: Cardiac: Goal: Ability to achieve and maintain adequate cardiopulmonary perfusion will improve Outcome: Progressing

## 2021-09-19 NOTE — Progress Notes (Signed)
Heart Failure Navigator Progress Note  Assessed for Heart & Vascular TOC clinic readiness.  Patient does not meet criteria due to patient has a scheduled hospital follow up on 09/24/21 with Kathlen Mody ( cardiology) per Dr. Verdell Carmine.     Earnestine Leys, BSN, Clinical cytogeneticist Only

## 2021-09-19 NOTE — Progress Notes (Signed)
Mobility Specialist Progress Note:   09/19/21 1038  Mobility  Activity Ambulated with assistance in hallway  Level of Assistance Independent after set-up  Assistive Device None  Distance Ambulated (ft) 550 ft  Activity Response Tolerated well  $Mobility charge 1 Mobility   Pt received in bed willing to participate in mobility. No complaints of pain. Left in bed with call bell in reach and all needs met.   [facility] Damaso Lad[place] Mobility Specialist

## 2021-09-20 DIAGNOSIS — I5023 Acute on chronic systolic (congestive) heart failure: Secondary | ICD-10-CM

## 2021-09-20 LAB — BASIC METABOLIC PANEL
Anion gap: 10 (ref 5–15)
BUN: 55 mg/dL — ABNORMAL HIGH (ref 8–23)
CO2: 24 mmol/L (ref 22–32)
Calcium: 9 mg/dL (ref 8.9–10.3)
Chloride: 104 mmol/L (ref 98–111)
Creatinine, Ser: 2.2 mg/dL — ABNORMAL HIGH (ref 0.61–1.24)
GFR, Estimated: 28 mL/min — ABNORMAL LOW (ref >60)
Glucose, Bld: 98 mg/dL (ref 70–99)
Potassium: 4 mmol/L (ref 3.5–5.1)
Sodium: 138 mmol/L (ref 135–145)

## 2021-09-20 LAB — GLUCOSE, CAPILLARY: Glucose-Capillary: 108 mg/dL — ABNORMAL HIGH (ref 70–99)

## 2021-09-20 MED ORDER — POTASSIUM CHLORIDE ER 10 MEQ PO TBCR
10.0000 meq | EXTENDED_RELEASE_TABLET | Freq: Every day | ORAL | Status: DC
Start: 2021-09-20 — End: 2022-01-20

## 2021-09-20 MED ORDER — FUROSEMIDE 40 MG PO TABS: 40.0000 mg | ORAL_TABLET | Freq: Every day | ORAL | Status: DC

## 2021-09-20 NOTE — Care Management Important Message (Signed)
Important Message  Patient Details  Name: [name] MRN: [id number] Date of Birth: November 06, 1928   Medicare Important Message Given:  Yes     Shelda Altes 09/20/2021, 7:47 AM

## 2021-09-20 NOTE — TOC Transition Note (Signed)
Transition of Care [facility]) - CM/SW Discharge Note   Patient Details  Name: [name] MRN: [id number] Date of Birth: 1929/03/24  Transition of Care [facility]) CM/SW Contact:  Verdell Carmine, RN Phone Number: 09/20/2021, 9:45 AM   Clinical Narrative:    Patient likely DC today, ambulating independently. No needs identified    Final next level of care: Home/Self Care Barriers to Discharge: No Barriers Identified   Patient Goals and CMS Choice        Discharge Placement               Home with self care        Discharge Plan and Services                                     Social Determinants of Health (SDOH) Interventions     Readmission Risk Interventions     View : No data to display.

## 2021-09-20 NOTE — Discharge Summary (Signed)
Physician Discharge Summary  [name]:[id number] DOB: Aug 17, 1928 DOA: 09/16/2021  PCP: [name], DO  Admit date: 09/16/2021 Discharge date: 09/20/2021  Time spent: 35 minutes  Recommendations for Outpatient Follow-up:  Cardiology Richardson Dopp PA-C on 5/30 PCP in 1 week, please check BMP at follow-up   Discharge Diagnoses:  Principal Problem:   Acute exacerbation of CHF (congestive heart failure) ([place]) Active Problems:   Essential hypertension   A-fib ([place])   Chronic kidney disease (CKD), stage III (moderate) ([place])   Hyperlipidemia LDL goal <70   Discharge Condition: Stable  Diet recommendation: Low-sodium, heart healthy  Filed Weights   09/18/21 0042 09/19/21 0434 09/20/21 0548  Weight: 69.2 kg 67.6 kg 66.1 kg    History of present illness:  88/M with history of chronic systolic CHF EF 85-88%, CHB status post pacer, paroxysmal A-fib on Coumadin, remote CABG, bladder cancer status post TURBT, CKD 4, anemia of chronic disease and iron deficiency, presented to the ED with shortness of breath, weight gain. -Chest x-ray noted small effusion, pulmonary vascular congestion, BNP 1700, creatinine was 2.4  Hospital Course:   Acute on chronic systolic CHF (congestive heart failure) ([place]) -last echo shows: EF 40-45%.  Cards clinic note reviewed, ischemic evaluation not pursued on account of age and CKD  -Improving with diuresis, he is 6.2 L negative -Switched over to oral Lasix 40 Mg daily at discharge, advised to take an additional 40 Mg for weight gain(3 pounds in 1 day or 5 pounds in 1 week) -GDMT limited by CKD -Discharged home in a stable condition, he is followed by Dr. Jenkins Rouge, has a cardiology follow-up on 5/30, also followed by [facility] needs BMP in 1 week   Chronic right leg edema -Following vein harvesting for CABG   Chronic kidney disease 3B-4 -Cardiorenal in the setting of above -baseline: 1.8-2, stable   A-fib  ([place]) -pacemaker- had CHB -continue eliquis   Essential hypertension -Remains on amlodipine   Anemia of chronic disease Iron deficiency anemia -Hemoglobin relatively stable, followed by Dr. Marin Olp at the cancer center, given IV iron yesterday   Hyperlipidemia LDL goal <70 -statin   Code Status: Discussed CODE STATUS with the patient he wishes to be a DNR   Discharge Exam: Vitals:   09/20/21 0548 09/20/21 0826  BP: (!) 147/66 (!) 157/54  Pulse: 63 64  Resp: 19 18  Temp: 98.5 F (36.9 C) 97.8 F (36.6 C)  SpO2: 94% 98%    General exam: Elderly chronically ill male sitting up in bed, AAOx3, no distress HEENT: Positive JVD CVS: S1-S2, regular rhythm Lungs: Rare basilar rales otherwise clear Abdomen: Soft, nontender, bowel sounds present Extremities: No edema in left leg, mild chronic 1+ edema in right leg  Skin: No rashes on exposed skin Psychiatry:  Mood & affect appropriate.     Discharge Instructions   Discharge Instructions     Diet - low sodium heart healthy   Complete by: As directed    Increase activity slowly   Complete by: As directed       Allergies as of 09/20/2021       Reactions   Lisinopril Other (See Comments)    he has difficulty controlling potassium on ace/arb- lisinopril was dc'd in the past 2/2 to hyperkalemia documented by [facility] PCP 01/2021   Penicillins Hives   Has patient had a PCN reaction causing immediate rash, facial/tongue/throat swelling, SOB or lightheadedness with hypotension: No Has patient had a PCN reaction causing  severe rash involving mucus membranes or skin necrosis: Yes Has patient had a PCN reaction that required hospitalization: No Has patient had a PCN reaction occurring within the last 10 years: No If all of the above answers are "NO", then may proceed with Cephalosporin use.   Vancomycin Other (See Comments)   Edema and myalgia        Medication List     STOP taking these medications    dicyclomine 10  MG capsule Commonly known as: BENTYL   diphenoxylate-atropine 2.5-0.025 MG tablet Commonly known as: LOMOTIL       TAKE these medications    allopurinol 300 MG tablet Commonly known as: ZYLOPRIM Take 0.5 tablets (150 mg total) by mouth daily.   amLODipine 5 MG tablet Commonly known as: NORVASC TAKE 1 TABLET DAILY What changed: when to take this   atorvastatin 20 MG tablet Commonly known as: LIPITOR Take 1 tablet (20 mg total) by mouth at bedtime.   carvedilol 6.25 MG tablet Commonly known as: COREG Take 1 tablet (6.25 mg total) by mouth 2 (two) times daily with a meal.   Cholecalciferol 125 MCG (5000 UT) capsule Take 5,000 Units by mouth daily.   Eliquis 2.5 MG Tabs tablet Generic drug: apixaban TAKE 1 TABLET TWICE A DAY What changed: how much to take   finasteride 5 MG tablet Commonly known as: PROSCAR Take 5 mg by mouth daily.   fish oil-omega-3 fatty acids 1000 MG capsule Take 1 g by mouth daily.   furosemide 40 MG tablet Commonly known as: LASIX Take 1 tablet (40 mg total) by mouth daily. Take an extra '40mg'$  lasix for 3 pound weight gain overnight or 5pounds in 1 week What changed: additional instructions   latanoprost 0.005 % ophthalmic solution Commonly known as: XALATAN Place 1 drop into both eyes at bedtime.   onetouch ultrasoft lancets Use as instructed   OneTouch Verio test strip Generic drug: glucose blood Use as instructed   potassium chloride 10 MEQ tablet Commonly known as: KLOR-CON Take 1 tablet (10 mEq total) by mouth daily.   PRESERVISION AREDS PO Take 1 tablet by mouth 2 (two) times daily.   PROBIOTIC PO Take 1 tablet by mouth daily.   tamsulosin 0.4 MG Caps capsule Commonly known as: FLOMAX Take 0.4 mg by mouth in the morning and at bedtime.   timolol 0.5 % ophthalmic solution Commonly known as: TIMOPTIC Place 1 drop into both eyes 2 (two) times daily.   UNABLE TO FIND Take 2 tablets by mouth every morning. Med Name: Mito  Q (takes place of Co Q 10)   VANADYL SULFATE PO Take 1 capsule by mouth every evening.       Allergies  Allergen Reactions   Lisinopril Other (See Comments)     he has difficulty controlling potassium on ace/arb- lisinopril was dc'd in the past 2/2 to hyperkalemia documented by [facility] PCP 01/2021   Penicillins Hives    Has patient had a PCN reaction causing immediate rash, facial/tongue/throat swelling, SOB or lightheadedness with hypotension: No Has patient had a PCN reaction causing severe rash involving mucus membranes or skin necrosis: Yes Has patient had a PCN reaction that required hospitalization: No Has patient had a PCN reaction occurring within the last 10 years: No If all of the above answers are "NO", then may proceed with Cephalosporin use.    Vancomycin Other (See Comments)    Edema and myalgia      The results of significant diagnostics from  this hospitalization (including imaging, microbiology, ancillary and laboratory) are listed below for reference.    Significant Diagnostic Studies: DG Chest 2 View  Result Date: 09/16/2021 CLINICAL DATA:  Shortness of breath EXAM: CHEST - 2 VIEW COMPARISON:  11/12/2020 FINDINGS: Post sternotomy changes. Left-sided pacing device. Small bilateral effusions. Borderline cardiac size. Aortic atherosclerosis. No pneumothorax. IMPRESSION: Borderline cardiac size with small bilateral effusions and mild central congestion. Electronically Signed   By: Donavan Foil M.D.   On: 09/16/2021 17:16   ECHOCARDIOGRAM COMPLETE  Result Date: 09/18/2021    ECHOCARDIOGRAM REPORT   Patient Name:   BROGHAN PANNONE Date of Exam: 09/18/2021 Medical Rec #:  [id number]        Height:       68.0 in Accession #:    [id number]       Weight:       152.6 lb Date of Birth:  [birth year]         BSA:          1.822 m Patient Age:    80 years         BP:           153/64 mmHg Patient Gender: M                HR:           63 bpm. Exam Location:  Inpatient Procedure:  2D Echo, Cardiac Doppler and Color Doppler Indications:    CHF  History:        Patient has prior history of Echocardiogram examinations. CHF,                 CAD, Arrythmias:Atrial Fibrillation; Risk Factors:Hypertension.  Sonographer:    Jyl Heinz Referring Phys: [place]  1. Left ventricular ejection fraction, by estimation, is 40 to 45%. The left ventricle has mildly decreased function. The left ventricle demonstrates global hypokinesis with septal-lateral dyssynchrony consistent with RV pacing. There is mild left ventricular hypertrophy. Left ventricular diastolic parameters are indeterminate.  2. Right ventricular systolic function is mildly reduced. The right ventricular size is normal. There is moderately elevated pulmonary artery systolic pressure. The estimated right ventricular systolic pressure is 62.2 mmHg.  3. Left atrial size was moderately dilated.  4. Right atrial size was mildly dilated.  5. The mitral valve is normal in structure. Mild mitral valve regurgitation. No evidence of mitral stenosis.  6. The aortic valve is tricuspid. Aortic valve regurgitation is trivial. No aortic stenosis is present.  7. The inferior vena cava is dilated in size with <50% respiratory variability, suggesting right atrial pressure of 15 mmHg. FINDINGS  Left Ventricle: Left ventricular ejection fraction, by estimation, is 40 to 45%. The left ventricle has mildly decreased function. The left ventricle demonstrates global hypokinesis. The left ventricular internal cavity size was normal in size. There is  mild left ventricular hypertrophy. Left ventricular diastolic parameters are indeterminate. Right Ventricle: The right ventricular size is normal. No increase in right ventricular wall thickness. Right ventricular systolic function is mildly reduced. There is moderately elevated pulmonary artery systolic pressure. The tricuspid regurgitant velocity is 2.87 m/s, and with an assumed right atrial  pressure of 15 mmHg, the estimated right ventricular systolic pressure is 29.7 mmHg. Left Atrium: Left atrial size was moderately dilated. Right Atrium: Right atrial size was mildly dilated. Pericardium: There is no evidence of pericardial effusion. Mitral Valve: The mitral valve is normal in structure. There is mild calcification of the  mitral valve leaflet(s). Mild mitral annular calcification. Mild mitral valve regurgitation. No evidence of mitral valve stenosis. Tricuspid Valve: The tricuspid valve is normal in structure. Tricuspid valve regurgitation is mild. Aortic Valve: The aortic valve is tricuspid. Aortic valve regurgitation is trivial. No aortic stenosis is present. Aortic valve peak gradient measures 4.8 mmHg. Pulmonic Valve: The pulmonic valve was normal in structure. Pulmonic valve regurgitation is trivial. Aorta: The aortic root is normal in size and structure. Venous: The inferior vena cava is dilated in size with less than 50% respiratory variability, suggesting right atrial pressure of 15 mmHg. IAS/Shunts: The interatrial septum was not well visualized. Additional Comments: A device lead is visualized in the right ventricle.  LEFT VENTRICLE PLAX 2D LVIDd:         4.60 cm      Diastology LVIDs:         3.20 cm      LV e' medial:    4.21 cm/s LV PW:         1.40 cm      LV E/e' medial:  32.1 LV IVS:        1.20 cm      LV e' lateral:   8.50 cm/s LVOT diam:     2.00 cm      LV E/e' lateral: 15.9 LV SV:         50 LV SV Index:   27 LVOT Area:     3.14 cm                              3D Volume EF: LV Volumes (MOD)            3D EF:        51 % LV vol d, MOD A2C: 159.0 ml LV EDV:       170 ml LV vol d, MOD A4C: 119.0 ml LV ESV:       83 ml LV vol s, MOD A2C: 87.5 ml  LV SV:        87 ml LV vol s, MOD A4C: 69.8 ml LV SV MOD A2C:     71.5 ml LV SV MOD A4C:     119.0 ml LV SV MOD BP:      60.0 ml RIGHT VENTRICLE            IVC RV Basal diam:  4.10 cm    IVC diam: 2.40 cm RV Mid diam:    2.80 cm RV S prime:      9.46 cm/s TAPSE (M-mode): 1.5 cm LEFT ATRIUM           Index        RIGHT ATRIUM           Index LA diam:      4.40 cm 2.42 cm/m   RA Area:     25.00 cm LA Vol (A2C): 91.2 ml 50.07 ml/m  RA Volume:   84.30 ml  46.28 ml/m LA Vol (A4C): 33.6 ml 18.45 ml/m  AORTIC VALVE AV Area (Vmax): 2.31 cm AV Vmax:        109.00 cm/s AV Peak Grad:   4.8 mmHg LVOT Vmax:      80.00 cm/s LVOT Vmean:     57.700 cm/s LVOT VTI:       0.158 m  AORTA Ao Root diam: 3.60 cm Ao Asc diam:  3.20 cm MITRAL VALVE  TRICUSPID VALVE MV Area (PHT): 4.71 cm     TR Peak grad:   32.9 mmHg MV Decel Time: 161 msec     TR Vmax:        287.00 cm/s MR Peak grad: 78.5 mmHg MR Vmax:      443.00 cm/s   SHUNTS MV E velocity: 135.00 cm/s  Systemic VTI:  0.16 m MV A velocity: 63.60 cm/s   Systemic Diam: 2.00 cm MV E/A ratio:  2.12 Dalton McleanMD Electronically signed by Franki Monte Signature Date/Time: 09/18/2021/3:31:42 PM    Final     Microbiology: Recent Results (from the past 240 hour(s))  Resp Panel by RT-PCR (Flu A&B, Covid) Nasopharyngeal Swab     Status: None   Collection Time: 09/16/21  5:57 PM   Specimen: Nasopharyngeal Swab; Nasopharyngeal(NP) swabs in vial transport medium  Result Value Ref Range Status   SARS Coronavirus 2 by RT PCR NEGATIVE NEGATIVE Final    Comment: (NOTE) SARS-CoV-2 target nucleic acids are NOT DETECTED.  The SARS-CoV-2 RNA is generally detectable in upper respiratory specimens during the acute phase of infection. The lowest concentration of SARS-CoV-2 viral copies this assay can detect is 138 copies/mL. A negative result does not preclude SARS-Cov-2 infection and should not be used as the sole basis for treatment or other patient management decisions. A negative result may occur with  improper specimen collection/handling, submission of specimen other than nasopharyngeal swab, presence of viral mutation(s) within the areas targeted by this assay, and inadequate number of  viral copies(<138 copies/mL). A negative result must be combined with clinical observations, patient history, and epidemiological information. The expected result is Negative.  Fact Sheet for Patients:  EntrepreneurPulse.com.au  Fact Sheet for Healthcare Providers:  IncredibleEmployment.be  This test is no t yet approved or cleared by the [place] FDA and  has been authorized for detection and/or diagnosis of SARS-CoV-2 by FDA under an Emergency Use Authorization (EUA). This EUA will remain  in effect (meaning this test can be used) for the duration of the COVID-19 declaration under Section 564(b)(1) of the Act, 21 U.S.C.section 360bbb-3(b)(1), unless the authorization is terminated  or revoked sooner.       Influenza A by PCR NEGATIVE NEGATIVE Final   Influenza B by PCR NEGATIVE NEGATIVE Final    Comment: (NOTE) The Xpert Xpress SARS-CoV-2/FLU/RSV plus assay is intended as an aid in the diagnosis of influenza from Nasopharyngeal swab specimens and should not be used as a sole basis for treatment. Nasal washings and aspirates are unacceptable for Xpert Xpress SARS-CoV-2/FLU/RSV testing.  Fact Sheet for Patients: EntrepreneurPulse.com.au  Fact Sheet for Healthcare Providers: IncredibleEmployment.be  This test is not yet approved or cleared by the [place] FDA and has been authorized for detection and/or diagnosis of SARS-CoV-2 by FDA under an Emergency Use Authorization (EUA). This EUA will remain in effect (meaning this test can be used) for the duration of the COVID-19 declaration under Section 564(b)(1) of the Act, 21 U.S.C. section 360bbb-3(b)(1), unless the authorization is terminated or revoked.  Performed at [facility], [address] [place], [place] [address]      Labs: Basic Metabolic Panel: Recent Labs  Lab 09/16/21 1643 09/17/21 1400 09/18/21 0337  09/19/21 0405 09/20/21 0358  NA 140 137 140 136 138  K 4.3 4.0 4.0 3.6 4.0  CL 106 106 107 105 104  CO2 21* '25 22 24 24  '$ GLUCOSE 226* 333* 98 105* 98  BUN 52* 46* 49* 53*  55*  CREATININE 2.47* 2.03* 2.24* 2.18* 2.20*  CALCIUM 9.1 8.6* 9.0 8.7* 9.0   Liver Function Tests: Recent Labs  Lab 09/16/21 1701  AST 25  ALT 19  ALKPHOS 85  BILITOT 0.7  PROT 7.2  ALBUMIN 3.7   No results for input(s): LIPASE, AMYLASE in the last 168 hours. No results for input(s): AMMONIA in the last 168 hours. CBC: Recent Labs  Lab 09/16/21 1643 09/17/21 1400 09/19/21 0405  WBC 6.8 6.6 6.2  NEUTROABS  --  4.4  --   HGB 10.7* 10.5* 9.5*  HCT 32.8* 31.3* 27.8*  MCV 92.9 92.1 90.0  PLT 138* 149* 139*   Cardiac Enzymes: No results for input(s): CKTOTAL, CKMB, CKMBINDEX, TROPONINI in the last 168 hours. BNP: BNP (last 3 results) Recent Labs    09/16/21 1654 09/17/21 1400 09/18/21 0337  BNP 1,729.1* 2,003.5* 1,507.3*    ProBNP (last 3 results) Recent Labs    08/20/21 1020 08/23/21 1041  PROBNP 1,616.0* 1,629.0*    CBG: Recent Labs  Lab 09/20/21 0634  GLUCAP 108*       Signed:  Domenic Polite MD.  [facility] 09/20/2021, 1:39 PM

## 2021-09-20 NOTE — Discharge Instructions (Signed)

## 2021-09-24 ENCOUNTER — Ambulatory Visit: Payer: Medicare Other | Admitting: Physician Assistant

## 2021-09-26 ENCOUNTER — Ambulatory Visit: Payer: Medicare Other | Admitting: Cardiovascular Disease

## 2021-09-27 DIAGNOSIS — Z8551 Personal history of malignant neoplasm of bladder: Secondary | ICD-10-CM | POA: Diagnosis not present

## 2021-10-18 ENCOUNTER — Other Ambulatory Visit: Payer: Self-pay | Admitting: Family Medicine

## 2021-10-30 ENCOUNTER — Ambulatory Visit (INDEPENDENT_AMBULATORY_CARE_PROVIDER_SITE_OTHER): Payer: Medicare Other

## 2021-10-30 DIAGNOSIS — I442 Atrioventricular block, complete: Secondary | ICD-10-CM | POA: Diagnosis not present

## 2021-10-31 ENCOUNTER — Inpatient Hospital Stay (HOSPITAL_BASED_OUTPATIENT_CLINIC_OR_DEPARTMENT_OTHER): Payer: Medicare Other | Admitting: Hematology & Oncology

## 2021-10-31 ENCOUNTER — Inpatient Hospital Stay: Payer: Medicare Other | Attending: Hematology & Oncology

## 2021-10-31 ENCOUNTER — Other Ambulatory Visit: Payer: Self-pay

## 2021-10-31 ENCOUNTER — Inpatient Hospital Stay: Payer: Medicare Other

## 2021-10-31 ENCOUNTER — Encounter: Payer: Self-pay | Admitting: Hematology & Oncology

## 2021-10-31 VITALS — BP 156/52 | HR 72 | Temp 97.9°F | Resp 17 | Wt 149.0 lb

## 2021-10-31 DIAGNOSIS — D509 Iron deficiency anemia, unspecified: Secondary | ICD-10-CM | POA: Insufficient documentation

## 2021-10-31 DIAGNOSIS — N1831 Chronic kidney disease, stage 3a: Secondary | ICD-10-CM | POA: Diagnosis not present

## 2021-10-31 DIAGNOSIS — N183 Chronic kidney disease, stage 3 unspecified: Secondary | ICD-10-CM | POA: Insufficient documentation

## 2021-10-31 DIAGNOSIS — Z79899 Other long term (current) drug therapy: Secondary | ICD-10-CM | POA: Diagnosis not present

## 2021-10-31 DIAGNOSIS — D631 Anemia in chronic kidney disease: Secondary | ICD-10-CM | POA: Insufficient documentation

## 2021-10-31 LAB — CBC WITH DIFFERENTIAL (CANCER CENTER ONLY)
Abs Immature Granulocytes: 0.01 10*3/uL (ref 0.00–0.07)
Basophils Absolute: 0 10*3/uL (ref 0.0–0.1)
Basophils Relative: 0 %
Eosinophils Absolute: 0.1 10*3/uL (ref 0.0–0.5)
Eosinophils Relative: 2 %
HCT: 35.4 % — ABNORMAL LOW (ref 39.0–52.0)
Hemoglobin: 11.7 g/dL — ABNORMAL LOW (ref 13.0–17.0)
Immature Granulocytes: 0 %
Lymphocytes Relative: 29 %
Lymphs Abs: 1.8 10*3/uL (ref 0.7–4.0)
MCH: 29.8 pg (ref 26.0–34.0)
MCHC: 33.1 g/dL (ref 30.0–36.0)
MCV: 90.3 fL (ref 80.0–100.0)
Monocytes Absolute: 0.5 10*3/uL (ref 0.1–1.0)
Monocytes Relative: 9 %
Neutro Abs: 3.7 10*3/uL (ref 1.7–7.7)
Neutrophils Relative %: 60 %
Platelet Count: 136 10*3/uL — ABNORMAL LOW (ref 150–400)
RBC: 3.92 MIL/uL — ABNORMAL LOW (ref 4.22–5.81)
RDW: 15 % (ref 11.5–15.5)
WBC Count: 6.2 10*3/uL (ref 4.0–10.5)
nRBC: 0 % (ref 0.0–0.2)

## 2021-10-31 LAB — RETICULOCYTES
Immature Retic Fract: 9.2 % (ref 2.3–15.9)
RBC.: 3.88 MIL/uL — ABNORMAL LOW (ref 4.22–5.81)
Retic Count, Absolute: 32.6 10*3/uL (ref 19.0–186.0)
Retic Ct Pct: 0.8 % (ref 0.4–3.1)

## 2021-10-31 LAB — CMP (CANCER CENTER ONLY)
ALT: 24 U/L (ref 0–44)
AST: 27 U/L (ref 15–41)
Albumin: 4.3 g/dL (ref 3.5–5.0)
Alkaline Phosphatase: 76 U/L (ref 38–126)
Anion gap: 9 (ref 5–15)
BUN: 41 mg/dL — ABNORMAL HIGH (ref 8–23)
CO2: 26 mmol/L (ref 22–32)
Calcium: 9.4 mg/dL (ref 8.9–10.3)
Chloride: 102 mmol/L (ref 98–111)
Creatinine: 1.96 mg/dL — ABNORMAL HIGH (ref 0.61–1.24)
GFR, Estimated: 32 mL/min — ABNORMAL LOW (ref >60)
Glucose, Bld: 184 mg/dL — ABNORMAL HIGH (ref 70–99)
Potassium: 4.6 mmol/L (ref 3.5–5.1)
Sodium: 137 mmol/L (ref 135–145)
Total Bilirubin: 0.6 mg/dL (ref 0.3–1.2)
Total Protein: 7.2 g/dL (ref 6.5–8.1)

## 2021-10-31 LAB — FERRITIN: Ferritin: 344 ng/mL — ABNORMAL HIGH (ref 24–336)

## 2021-10-31 NOTE — Progress Notes (Signed)
Hematology and Oncology Follow Up Visit  [name] [id number] 02-28-1929 88 y.o. 10/31/2021   Principle Diagnosis:  Erythropoietin deficiency anemia Iron deficiency anemia  Current Therapy:   IV iron as indicated -- feraheme given on 09/18/2021 Aranesp 300 g sq prn hemoglobin < 11 - last dose given on  09/19/2021   Interim History:  [name] is here today for follow-up.  I actually saw him in the hospital.  He was admitted over to [facility].  This was in May.  He had a congestive heart failure.  He was diuresed.  While he was there, he is quite anemic.  His iron saturation was only 9%.  We did give him a dose of Feraheme.  He also got a dose of Aranesp.  He is feeling a whole lot better.  He looks great.  He has had a good appetite.  He had a very nice July 4 holiday.  He has had no obvious bleeding.  There is been no problems with bladder issues.  He does have superficial bladder cancer that was treated.  He has had no problems with his bowels.  He has had a little bit of leg swelling which is chronic.  Overall, I would say his performance status is probably ECOG 2.     Medications:  Allergies as of 10/31/2021       Reactions   Lisinopril Other (See Comments)    he has difficulty controlling potassium on ace/arb- lisinopril was dc'd in the past 2/2 to hyperkalemia documented by [facility] P[facility] 01/2021   Penicillins Hives   Has patient had a PCN reaction causing immediate rash, facial/tongue/throat swelling, SOB or lightheadedness with hypotension: No Has patient had a PCN reaction causing severe rash involving mucus membranes or skin necrosis: Yes Has patient had a PCN reaction that required hospitalization: No Has patient had a PCN reaction occurring within the last 10 years: No If all of the above answers are "NO", then may proceed with Cephalosporin use.   Vancomycin Other (See Comments)   Edema and myalgia        Medication List        Accurate as of October 31, 2021  4:06 PM. If you have any questions, ask your nurse or doctor.          allopurinol 300 MG tablet Commonly known as: ZYLOPRIM Take 0.5 tablets (150 mg total) by mouth daily.   amLODipine 5 MG tablet Commonly known as: NORVASC TAKE 1 TABLET DAILY What changed: when to take this   atorvastatin 20 MG tablet Commonly known as: LIPITOR Take 1 tablet (20 mg total) by mouth at bedtime.   carvedilol 6.25 MG tablet Commonly known as: COREG Take 1 tablet (6.25 mg total) by mouth 2 (two) times daily with a meal.   Cholecalciferol 125 MCG (5000 UT) capsule Take 5,000 Units by mouth daily.   Eliquis 2.5 MG Tabs tablet Generic drug: apixaban TAKE 1 TABLET TWICE A DAY What changed: how much to take   finasteride 5 MG tablet Commonly known as: PROSCAR Take 5 mg by mouth daily.   fish oil-omega-3 fatty acids 1000 MG capsule Take 1 g by mouth daily.   furosemide 40 MG tablet Commonly known as: LASIX TAKE 1 TABLET BY MOUTH EVERY DAY   latanoprost 0.005 % ophthalmic solution Commonly known as: XALATAN Place 1 drop into both eyes at bedtime.   onetouch ultrasoft lancets Use as instructed   OneTouch Verio test strip Generic drug:  glucose blood Use as instructed   potassium chloride 10 MEQ tablet Commonly known as: KLOR-CON Take 1 tablet (10 mEq total) by mouth daily.   PRESERVISION AREDS PO Take 1 tablet by mouth 2 (two) times daily.   PROBIOTIC PO Take 1 tablet by mouth daily.   tamsulosin 0.4 MG Caps capsule Commonly known as: FLOMAX Take 0.4 mg by mouth in the morning and at bedtime.   timolol 0.5 % ophthalmic solution Commonly known as: TIMOPTIC Place 1 drop into both eyes 2 (two) times daily.   UNABLE TO FIND Take 2 tablets by mouth every morning. Med Name: Mito Q (takes place of Co Q 10)   VANADYL SULFATE PO Take 1 capsule by mouth every evening.        Allergies:  Allergies  Allergen Reactions   Lisinopril Other (See Comments)     he has  difficulty controlling potassium on ace/arb- lisinopril was dc'd in the past 2/2 to hyperkalemia documented by [facility] 01/2021   Penicillins Hives    Has patient had a PCN reaction causing immediate rash, facial/tongue/throat swelling, SOB or lightheadedness with hypotension: No Has patient had a PCN reaction causing severe rash involving mucus membranes or skin necrosis: Yes Has patient had a PCN reaction that required hospitalization: No Has patient had a PCN reaction occurring within the last 10 years: No If all of the above answers are "NO", then may proceed with Cephalosporin use.    Vancomycin Other (See Comments)    Edema and myalgia    Past Medical History, Surgical history, Social history, and Family History were reviewed and updated.  Review of Systems: Review of Systems  Constitutional: Negative.   HENT: Negative.    Eyes: Negative.   Respiratory: Negative.    Cardiovascular:  Positive for palpitations.  Gastrointestinal: Negative.   Genitourinary: Negative.   Musculoskeletal: Negative.   Skin: Negative.   Neurological: Negative.   Endo/Heme/Allergies: Negative.   Psychiatric/Behavioral: Negative.       Physical Exam:  weight is 149 lb (67.6 kg). His oral temperature is 97.9 F (36.6 C). His blood pressure is 156/52 (abnormal) and his pulse is 72. His respiration is 17 and oxygen saturation is 99%.   Wt Readings from Last 3 Encounters:  10/31/21 149 lb (67.6 kg)  09/20/21 145 lb 12.8 oz (66.1 kg)  08/23/21 161 lb (73 kg)    Physical Exam Vitals reviewed.  HENT:     Head: Normocephalic and atraumatic.  Eyes:     Pupils: Pupils are equal, round, and reactive to light.  Cardiovascular:     Rate and Rhythm: Normal rate and regular rhythm.     Heart sounds: Normal heart sounds.  Pulmonary:     Effort: Pulmonary effort is normal.     Breath sounds: Normal breath sounds.  Abdominal:     General: Bowel sounds are normal.     Palpations: Abdomen is soft.   Musculoskeletal:        General: No tenderness or deformity. Normal range of motion.     Cervical back: Normal range of motion.  Lymphadenopathy:     Cervical: No cervical adenopathy.  Skin:    General: Skin is warm and dry.     Findings: No erythema or rash.  Neurological:     Mental Status: He is alert and oriented to person, place, and time.  Psychiatric:        Behavior: Behavior normal.        Thought Content: Thought  content normal.        Judgment: Judgment normal.      Lab Results  Component Value Date   WBC 6.2 10/31/2021   HGB 11.7 (L) 10/31/2021   HCT 35.4 (L) 10/31/2021   MCV 90.3 10/31/2021   PLT 136 (L) 10/31/2021   Lab Results  Component Value Date   FERRITIN 261 09/18/2021   IRON 24 (L) 09/18/2021   TIBC 255 09/18/2021   UIBC 231 09/18/2021   IRONPCTSAT 9 (L) 09/18/2021   Lab Results  Component Value Date   RETICCTPCT 0.8 10/31/2021   RBC 3.92 (L) 10/31/2021   RBC 3.88 (L) 10/31/2021   No results found for: "KPAFRELGTCHN", "LAMBDASER", "KAPLAMBRATIO" No results found for: "IGGSERUM", "IGA", "IGMSERUM" Lab Results  Component Value Date   TOTALPROTELP 6.0 (L) 12/26/2015   ALBUMINELP 3.4 (L) 12/26/2015   A1GS 0.4 (H) 12/26/2015   A2GS 0.8 12/26/2015   BETS 0.4 12/26/2015   BETA2SER 0.3 12/26/2015   GAMS 0.8 12/26/2015   MSPIKE Not Observed 01/29/2016   SPEI SEE NOTE 12/26/2015     Chemistry      Component Value Date/Time   NA 137 10/31/2021 1511   NA 141 08/11/2017 0924   NA 145 04/09/2017 0905   NA 140 03/28/2016 0853   K 4.6 10/31/2021 1511   K 4.5 04/09/2017 0905   K 4.4 03/28/2016 0853   CL 102 10/31/2021 1511   CL 105 04/09/2017 0905   CO2 26 10/31/2021 1511   CO2 27 04/09/2017 0905   CO2 19 (L) 03/28/2016 0853   BUN 41 (H) 10/31/2021 1511   BUN 27 08/11/2017 0924   BUN 33 (H) 04/09/2017 0905   BUN 34.4 (H) 03/28/2016 0853   CREATININE 1.96 (H) 10/31/2021 1511   CREATININE 1.72 (H) 08/18/2017 1543   CREATININE 1.7 (H)  03/28/2016 0853   GLU 113 07/24/2016 0000      Component Value Date/Time   CALCIUM 9.4 10/31/2021 1511   CALCIUM 9.2 04/09/2017 0905   CALCIUM 9.2 03/28/2016 0853   ALKPHOS 76 10/31/2021 1511   ALKPHOS 69 04/09/2017 0905   ALKPHOS 85 03/28/2016 0853   AST 27 10/31/2021 1511   AST 23 03/28/2016 0853   ALT 24 10/31/2021 1511   ALT 27 04/09/2017 0905   ALT 37 03/28/2016 0853   BILITOT 0.6 10/31/2021 1511   BILITOT 0.79 03/28/2016 0853      Impression and Plan: [name] is a very pleasant 88 yo caucasian gentleman with both erythropoietin deficiency and iron deficiency anemia.    I am not surprised as hemoglobin is so good.  He was responds well to the Aranesp.  He also got iron.  The combination of the 2 clearly has helped.  We will now plan to get him back after Labor Day.  I think that he will do well until we see him back in Labor Day.  If he does have any problems, he goes to church with me and he was let me know if there is anything going on.  [name], MD 7/6/20234:06 PM

## 2021-11-01 ENCOUNTER — Ambulatory Visit: Payer: Medicare Other | Admitting: Hematology & Oncology

## 2021-11-01 ENCOUNTER — Other Ambulatory Visit: Payer: Medicare Other

## 2021-11-01 ENCOUNTER — Ambulatory Visit: Payer: Medicare Other

## 2021-11-01 LAB — IRON AND IRON BINDING CAPACITY (CC-WL,HP ONLY)
Iron: 77 ug/dL (ref 45–182)
Saturation Ratios: 29 % (ref 17.9–39.5)
TIBC: 269 ug/dL (ref 250–450)
UIBC: 192 ug/dL (ref 117–376)

## 2021-11-02 LAB — CUP PACEART REMOTE DEVICE CHECK
Battery Remaining Longevity: 68 mo
Battery Remaining Percentage: 58 %
Battery Voltage: 2.99 V
Brady Statistic RV Percent Paced: 99 %
Date Time Interrogation Session: 20230704020829
Implantable Lead Implant Date: 20190402
Implantable Lead Implant Date: 20190402
Implantable Lead Location: 753859
Implantable Lead Location: 753860
Implantable Pulse Generator Implant Date: 20190402
Lead Channel Impedance Value: 460 Ohm
Lead Channel Pacing Threshold Amplitude: 0.5 V
Lead Channel Pacing Threshold Pulse Width: 0.5 ms
Lead Channel Sensing Intrinsic Amplitude: 12 mV
Lead Channel Setting Pacing Amplitude: 2.5 V
Lead Channel Setting Pacing Pulse Width: 0.5 ms
Lead Channel Setting Sensing Sensitivity: 2 mV
Pulse Gen Model: 2272
Pulse Gen Serial Number: 9003454

## 2021-11-18 ENCOUNTER — Ambulatory Visit (INDEPENDENT_AMBULATORY_CARE_PROVIDER_SITE_OTHER): Payer: Medicare Other | Admitting: Nurse Practitioner

## 2021-11-18 ENCOUNTER — Ambulatory Visit: Payer: Medicare Other | Admitting: Cardiovascular Disease

## 2021-11-18 ENCOUNTER — Encounter: Payer: Self-pay | Admitting: Nurse Practitioner

## 2021-11-18 VITALS — BP 140/50 | HR 62 | Ht 68.0 in | Wt 152.2 lb

## 2021-11-18 DIAGNOSIS — I5022 Chronic systolic (congestive) heart failure: Secondary | ICD-10-CM

## 2021-11-18 DIAGNOSIS — Z951 Presence of aortocoronary bypass graft: Secondary | ICD-10-CM | POA: Diagnosis not present

## 2021-11-18 DIAGNOSIS — I482 Chronic atrial fibrillation, unspecified: Secondary | ICD-10-CM | POA: Diagnosis not present

## 2021-11-18 DIAGNOSIS — D631 Anemia in chronic kidney disease: Secondary | ICD-10-CM | POA: Diagnosis not present

## 2021-11-18 DIAGNOSIS — I4819 Other persistent atrial fibrillation: Secondary | ICD-10-CM

## 2021-11-18 DIAGNOSIS — H02052 Trichiasis without entropian right lower eyelid: Secondary | ICD-10-CM | POA: Diagnosis not present

## 2021-11-18 NOTE — Patient Instructions (Signed)
Medication Instructions:  Your physician recommends that you continue on your current medications as directed. Please refer to the Current Medication list given to you today.  *If you need a refill on your cardiac medications before your next appointment, please call your pharmacy*   Lab Work: None ordered  If you have labs (blood work) drawn today and your tests are completely normal, you will receive your results only by: [place] (if you have MyChart) OR A paper copy in the mail If you have any lab test that is abnormal or we need to change your treatment, we will call you to review the results.   Testing/Procedures: None ordered   Follow-Up: At [facility], you and your health needs are our priority.  As part of our continuing mission to provide you with exceptional heart care, we have created designated Provider Care Teams.  These Care Teams include your primary Cardiologist (physician) and Advanced Practice Providers (APPs -  Physician Assistants and Nurse Practitioners) who all work together to provide you with the care you need, when you need it.  We recommend signing up for the patient portal called "MyChart".  Sign up information is provided on this After Visit Summary.  MyChart is used to connect with patients for Virtual Visits (Telemedicine).  Patients are able to view lab/test results, encounter notes, upcoming appointments, etc.  Non-urgent messages can be sent to your provider as well.   To learn more about what you can do with MyChart, go to [email or web address].    Your next appointment:   6 month(s)  The format for your next appointment:   In Person  Provider:   Jenkins Rouge, MD     Other Instructions   Important Information About Sugar

## 2021-11-18 NOTE — Progress Notes (Signed)
Office Visit    Patient Name: [name] Date of Encounter: 11/18/2021  Primary Care Provider:  [name], DO Primary Cardiologist:  Jenkins Rouge, MD Primary Electrophysiologist: Thompson Grayer, MD  Chief Complaint    [name] is a 88 y.o. male with PMH of CAD s/p CABG in 1999 LIMA to LAD, SVG to IM, Sequenced SVG OM1OM2, SVG to D1 and SVG to PDA. Grafts patent by cath in 2011 and non ischemic myovue in 2017 with EF 70%.  CHB s/p Saint Jude PPM in 2019, PAF, anemia on Aranesp and iron infusions chronically.  Past Medical History    Past Medical History:  Diagnosis Date   A-fib [facility])    Basal cell carcinoma    skin   Bigeminal rhythm    Bradycardia 06/21/2017   Cancer of anterior wall of urinary bladder ([place]) 07/18/2019   Cataract    Chronic renal insufficiency, stage 3 (moderate) ([place]) 2018   GFR 30s-40s   Coronary artery disease    post bypass   CVD (cardiovascular disease)    Diabetes mellitus without complication ([place])    type 2 diet controlled   Diverticular disease    Easy bruising    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   Gout    Hematuria 06/30/2019   Hernia    Hyperkalemia 05/06/2017   Hyperlipidemia    Hypertension    IBS (irritable bowel syndrome)    Iron deficiency anemia 02/04/2016   Macular degeneration    Microscopic colitis    PVD (peripheral vascular disease) ([place])    Past Surgical History:  Procedure Laterality Date   CARDIAC CATHETERIZATION  2006   CARDIOVERSION N/A 02/22/2013   Procedure: CARDIOVERSION;  Surgeon: [name], MD;  Location: [place];  Service: Cardiovascular;  Laterality: N/A;   CARDIOVERSION N/A 11/15/2014   Procedure: CARDIOVERSION;  Surgeon: [name], MD;  Location: [place];  Service: Cardiovascular;  Laterality: N/A;   CARDIOVERSION N/A 04/01/2017   Procedure: CARDIOVERSION;  Surgeon: Larey Dresser, MD;  Location: [place];  Service: Cardiovascular;  Laterality: N/A;   CAROTID  ENDARTERECTOMY  2009/ 1993   left/ right   COLONOSCOPY  03/17/2005   The colon is normal.   CORONARY ARTERY BYPASS GRAFT  1999   CYSTOSCOPY W/ RETROGRADES Bilateral 07/14/2019   Procedure: CYSTOSCOPY WITH RETROGRADE PYELOGRAM;  Surgeon: Franchot Gallo, MD;  Location: [facility];  Service: Urology;  Laterality: Bilateral;   ESOPHAGOGASTRODUODENOSCOPY  03/17/2005   Normal esophagus. Normal Stomanch. Normal duodenum.    EYE SURGERY     eyelid repair   INGUINAL HERNIA REPAIR  02/11/2012   Procedure: LAPAROSCOPIC BILATERAL INGUINAL HERNIA REPAIR;  Surgeon: Pedro Earls, MD;  Location: [facility];  Service: General;  Laterality: Bilateral;   PACEMAKER IMPLANT N/A 07/28/2017   St Jude Medical Assurity MRI conditional  dual-chamber pacemaker for symptomatic second degree AV block by Dr Rayann Heman   SKIN CANCER EXCISION     right ear x 3   TRANSURETHRAL RESECTION OF BLADDER TUMOR N/A 09/05/2019   Procedure: TRANSURETHRAL RESECTION OF BLADDER TUMOR (TURBT);  Surgeon: Franchot Gallo, MD;  Location: [facility];  Service: Urology;  Laterality: N/A;   TRANSURETHRAL RESECTION OF BLADDER TUMOR WITH MITOMYCIN-C N/A 07/14/2019   Procedure: TRANSURETHRAL RESECTION OF BLADDER TUMOR WITH GEMCITABINE IN PACU;  Surgeon: Franchot Gallo, MD;  Location[facility];  Service: Urology;  Laterality: N/A;   Allergies  Allergies  Allergen Reactions  Lisinopril Other (See Comments)     he has difficulty controlling potassium on ace/arb- lisinopril was dc'd in the past 2/2 to hyperkalemia documented by [facility] P[facility] 01/2021   Penicillins Hives    Has patient had a PCN reaction causing immediate rash, facial/tongue/throat swelling, SOB or lightheadedness with hypotension: No Has patient had a PCN reaction causing severe rash involving mucus membranes or skin necrosis: Yes Has patient had a PCN reaction that required hospitalization: No Has patient had a PCN reaction  occurring within the last 10 years: No If all of the above answers are "NO", then may proceed with Cephalosporin use.    Vancomycin Other (See Comments)    Edema and myalgia    History of Present Illness    [name] is a 88 year old male with the above-mentioned past medical history who was here today for 66-monthfollow-up.  He was last seen by Dr. NJohnsie Cancelon 05/2021.  During that visit patient was doing well with no complaints of chest pain or shortness of breath.  TTE was completed 11/20/2020 with EF of 40-45% and severe biatrial enlargement with moderate MR.  This shared decision was made given patient's age and renal failure of coronary symptoms that a heart cath would not be pursued.    He was admitted on 09/16/2021 for heart failure exacerbation.  He presented with shortness of breath and was admitted for diuresis with a nChest x-ray noted small effusion, pulmonary vascular congestion, BNP 1700, creatinine was 2.4et loss of 6.2 L.  He was discharged in stable condition 2 days later with p.o. prescription for 40 mg of Lasix daily with as needed dose for 3 pound weight gain in 1 day or 5 pounds in 1 week.  No further medication changes were made at that time.  He presents today for follow-up from hospital visit and May.  He apparently missed or had some confusion regarding his post hospital follow-up.  He is euvolemic on examination today and denies chest pain, palpitations, dyspnea, PND, orthopnea, nausea, vomiting, dizziness, syncope, edema, weight gain, or early satiety.  He is abstaining from excess salt in his diet and is eating a [employer] diet consisting of fruits and vegetables with very little processed foods.  His blood pressure today was 140/50 and at home it runs in the 1093Osystolically.  He is currently followed by hematology for chronic anemia.   Home Medications    Current Outpatient Medications  Medication Sig Dispense Refill   allopurinol (ZYLOPRIM) 300 MG tablet Take  0.5 tablets (150 mg total) by mouth daily. 45 tablet 3   amLODipine (NORVASC) 5 MG tablet TAKE 1 TABLET DAILY (Patient taking differently: Take 5 mg by mouth at bedtime.) 30 tablet 0   apixaban (ELIQUIS) 2.5 MG TABS tablet TAKE 1 TABLET TWICE A DAY (Patient taking differently: Take 2.5 mg by mouth 2 (two) times daily.) 180 tablet 1   atorvastatin (LIPITOR) 20 MG tablet Take 1 tablet (20 mg total) by mouth at bedtime. 90 tablet 3   carvedilol (COREG) 6.25 MG tablet Take 1 tablet (6.25 mg total) by mouth 2 (two) times daily with a meal. 180 tablet 3   Cholecalciferol 125 MCG (5000 UT) capsule Take 5,000 Units by mouth daily.     finasteride (PROSCAR) 5 MG tablet Take 5 mg by mouth daily.  3   fish oil-omega-3 fatty acids 1000 MG capsule Take 1 g by mouth daily.      furosemide (LASIX) 40 MG tablet TAKE 1  TABLET BY MOUTH EVERY DAY 90 tablet 3   glucose blood (ONETOUCH VERIO) test strip Use as instructed 100 each 12   Lancets (ONETOUCH ULTRASOFT) lancets Use as instructed 100 each 12   latanoprost (XALATAN) 0.005 % ophthalmic solution Place 1 drop into both eyes at bedtime.     Multiple Vitamins-Minerals (PRESERVISION AREDS PO) Take 1 tablet by mouth 2 (two) times daily.      potassium chloride (KLOR-CON) 10 MEQ tablet Take 1 tablet (10 mEq total) by mouth daily.     Probiotic Product (PROBIOTIC PO) Take 1 tablet by mouth daily.     tamsulosin (FLOMAX) 0.4 MG CAPS capsule Take 0.4 mg by mouth in the morning and at bedtime.     timolol (TIMOPTIC) 0.5 % ophthalmic solution Place 1 drop into both eyes 2 (two) times daily.      UNABLE TO FIND Take 2 tablets by mouth every morning. Med Name: Mito Q (takes place of Co Q 10)     VANADYL SULFATE PO Take 1 capsule by mouth every evening.      No current facility-administered medications for this visit.     Review of Systems  Please see the history of present illness.    (+) Chronic right leg edema  All other systems reviewed and are otherwise negative  except as noted above.  Physical Exam    Wt Readings from Last 3 Encounters:  11/18/21 152 lb 3.2 oz (69 kg)  10/31/21 149 lb (67.6 kg)  09/20/21 145 lb 12.8 oz (66.1 kg)   VS: Vitals:   11/18/21 0858  BP: (!) 140/50  Pulse: 62  SpO2: 98%  ,Body mass index is 23.14 kg/m.  Constitutional:      Appearance: Healthy appearance. Not in distress.  Neck:     Vascular: JVD normal.  Pulmonary:     Effort: Pulmonary effort is normal.     Breath sounds: No wheezing. No rales. Diminished in the bases Cardiovascular:     Normal rate. Regular rhythm. Normal S1. Normal S2.      Murmurs: There is no murmur.  Edema:    Peripheral edema chronic and right lower extremity +1 absent in left lower extremity.  Abdominal:     Palpations: Abdomen is soft non tender. There is no hepatomegaly.  Skin:    General: Skin is warm and dry.  Neurological:     General: No focal deficit present.     Mental Status: Alert and oriented to person, place and time.     Cranial Nerves: Cranial nerves are intact.  EKG/LABS/Other Studies Reviewed    ECG personally reviewed by me today -none completed today  Risk Assessment/Calculations:    CHA2DS2-VASc Score = 6   This indicates a 9.7% annual risk of stroke. The patient's score is based upon: CHF History: 1 HTN History: 1 Diabetes History: 1 Stroke History: 0 Vascular Disease History: 1 Age Score: 2 Gender Score: 0           Lab Results  Component Value Date   WBC 6.2 10/31/2021   HGB 11.7 (L) 10/31/2021   HCT 35.4 (L) 10/31/2021   MCV 90.3 10/31/2021   PLT 136 (L) 10/31/2021   Lab Results  Component Value Date   CREATININE 1.96 (H) 10/31/2021   BUN 41 (H) 10/31/2021   NA 137 10/31/2021   K 4.6 10/31/2021   CL 102 10/31/2021   CO2 26 10/31/2021   Lab Results  Component Value Date   ALT 24  10/31/2021   AST 27 10/31/2021   ALKPHOS 76 10/31/2021   BILITOT 0.6 10/31/2021   Lab Results  Component Value Date   CHOL 125 11/24/2019    HDL 57 11/24/2019   LDLCALC 54 11/24/2019   LDLDIRECT 49 02/11/2021   TRIG 67 11/24/2019   CHOLHDL 2.2 11/24/2019    Lab Results  Component Value Date   HGBA1C 6.0 (H) 02/11/2021    Assessment & Plan    1.  Coronary artery disease: -History of CABG in 1999 with repeat cath in 2011 with patent grafts. -Patient denies any anginal complaints or shortness of breath at this time -Continue current GDMT with carvedilol 6.125 twice daily, Lipitor 20 mg daily.  Patient currently not on ASA therapy due to Eliquis for AF  2.  Persistent atrial fibrillation: -Rate currently controlled with heart rate controlled with beta-blocker -Continue Eliquis 2.5 mg daily, reduced dose appropriate based on age and creatinine -CHA2DS2-VASc Score = 6 [CHF History: 1, HTN History: 1, Diabetes History: 1, Stroke History: 0, Vascular Disease History: 1, Age Score: 2, Gender Score: 0].  Therefore, the patient's annual risk of stroke is 9.7 %.      3.  Congestive heart failure: -Patient's last 2D echo shows: EF 40-45%.  Shared decision made to not pursue ischemic evaluation due to age and chronic kidney disease. -Patient was admitted in May for volume overload and today is euvolemic on examination -Current GDMT is limited by CKD and consist of carvedilol 6.25 twice daily -Continue  Lasix 40 mg daily with extra 40 mg as needed for weight gain of 3 pounds in 1 day or 5 pounds in a week -Low sodium diet, fluid restriction <2L, and daily weights encouraged. Educated to contact our office for weight gain of 2 lbs overnight or 5 lbs in one week.   4.  Complete heart block: -Saint Jude PPM followed by Dr. Rayann Heman last Paceart with normal histogram -V pacing   5.  Erythropoietin deficiency anemia: -Managed with Aranesp and patient is currently seeing hematology -CBC stable completed last month  Disposition: Follow-up with Jenkins Rouge, MD or APP in 6 months   Medication Adjustments/Labs and Tests Ordered: Current  medicines are reviewed at length with the patient today.  Concerns regarding medicines are outlined above.   Signed, Mable Fill, Marissa Nestle, NP 11/18/2021, 9:30 AM [place]

## 2021-11-20 NOTE — Progress Notes (Signed)
Remote pacemaker transmission.   

## 2021-11-26 ENCOUNTER — Other Ambulatory Visit: Payer: Self-pay

## 2021-11-26 MED ORDER — ONETOUCH ULTRASOFT LANCETS MISC: 12 refills | Status: DC

## 2021-12-06 ENCOUNTER — Other Ambulatory Visit: Payer: Self-pay | Admitting: Cardiovascular Disease

## 2021-12-06 DIAGNOSIS — I4819 Other persistent atrial fibrillation: Secondary | ICD-10-CM

## 2021-12-06 NOTE — Telephone Encounter (Signed)
Prescription refill request for Eliquis received. Indication:Afib Last office visit:7/23 Scr:1.9 Age: 88 Weight:69 kg  Prescription refilled

## 2021-12-10 ENCOUNTER — Ambulatory Visit: Payer: Medicare Other | Admitting: Cardiovascular Disease

## 2021-12-11 DIAGNOSIS — M25512 Pain in left shoulder: Secondary | ICD-10-CM | POA: Diagnosis not present

## 2021-12-11 DIAGNOSIS — M5030 Other cervical disc degeneration, unspecified cervical region: Secondary | ICD-10-CM | POA: Diagnosis not present

## 2021-12-17 DIAGNOSIS — L603 Nail dystrophy: Secondary | ICD-10-CM | POA: Diagnosis not present

## 2021-12-17 DIAGNOSIS — I739 Peripheral vascular disease, unspecified: Secondary | ICD-10-CM | POA: Diagnosis not present

## 2021-12-17 DIAGNOSIS — L84 Corns and callosities: Secondary | ICD-10-CM | POA: Diagnosis not present

## 2021-12-24 DIAGNOSIS — Z85828 Personal history of other malignant neoplasm of skin: Secondary | ICD-10-CM | POA: Diagnosis not present

## 2021-12-24 DIAGNOSIS — L821 Other seborrheic keratosis: Secondary | ICD-10-CM | POA: Diagnosis not present

## 2021-12-24 DIAGNOSIS — D1801 Hemangioma of skin and subcutaneous tissue: Secondary | ICD-10-CM | POA: Diagnosis not present

## 2021-12-24 DIAGNOSIS — L570 Actinic keratosis: Secondary | ICD-10-CM | POA: Diagnosis not present

## 2022-01-02 ENCOUNTER — Inpatient Hospital Stay: Payer: Medicare Other | Attending: Hematology & Oncology

## 2022-01-02 ENCOUNTER — Inpatient Hospital Stay: Payer: Medicare Other

## 2022-01-02 ENCOUNTER — Encounter: Payer: Self-pay | Admitting: Hematology & Oncology

## 2022-01-02 ENCOUNTER — Inpatient Hospital Stay (HOSPITAL_BASED_OUTPATIENT_CLINIC_OR_DEPARTMENT_OTHER): Payer: Medicare Other | Admitting: Hematology & Oncology

## 2022-01-02 ENCOUNTER — Telehealth: Payer: Self-pay | Admitting: *Deleted

## 2022-01-02 VITALS — BP 161/54 | HR 60 | Temp 98.0°F | Resp 18 | Wt 149.8 lb

## 2022-01-02 DIAGNOSIS — D631 Anemia in chronic kidney disease: Secondary | ICD-10-CM | POA: Insufficient documentation

## 2022-01-02 DIAGNOSIS — D509 Iron deficiency anemia, unspecified: Secondary | ICD-10-CM

## 2022-01-02 DIAGNOSIS — D508 Other iron deficiency anemias: Secondary | ICD-10-CM

## 2022-01-02 DIAGNOSIS — N183 Chronic kidney disease, stage 3 unspecified: Secondary | ICD-10-CM | POA: Diagnosis not present

## 2022-01-02 DIAGNOSIS — N1831 Chronic kidney disease, stage 3a: Secondary | ICD-10-CM

## 2022-01-02 DIAGNOSIS — N184 Chronic kidney disease, stage 4 (severe): Secondary | ICD-10-CM | POA: Insufficient documentation

## 2022-01-02 LAB — CMP (CANCER CENTER ONLY)
ALT: 29 U/L (ref 0–44)
AST: 27 U/L (ref 15–41)
Albumin: 3.9 g/dL (ref 3.5–5.0)
Alkaline Phosphatase: 76 U/L (ref 38–126)
Anion gap: 8 (ref 5–15)
BUN: 45 mg/dL — ABNORMAL HIGH (ref 8–23)
CO2: 24 mmol/L (ref 22–32)
Calcium: 9.1 mg/dL (ref 8.9–10.3)
Chloride: 105 mmol/L (ref 98–111)
Creatinine: 2.11 mg/dL — ABNORMAL HIGH (ref 0.61–1.24)
GFR, Estimated: 29 mL/min — ABNORMAL LOW (ref >60)
Glucose, Bld: 208 mg/dL — ABNORMAL HIGH (ref 70–99)
Potassium: 4.8 mmol/L (ref 3.5–5.1)
Sodium: 137 mmol/L (ref 135–145)
Total Bilirubin: 0.7 mg/dL (ref 0.3–1.2)
Total Protein: 6.8 g/dL (ref 6.5–8.1)

## 2022-01-02 LAB — CBC WITH DIFFERENTIAL (CANCER CENTER ONLY)
Abs Immature Granulocytes: 0.04 10*3/uL (ref 0.00–0.07)
Basophils Absolute: 0 10*3/uL (ref 0.0–0.1)
Basophils Relative: 0 %
Eosinophils Absolute: 0.1 10*3/uL (ref 0.0–0.5)
Eosinophils Relative: 1 %
HCT: 31.1 % — ABNORMAL LOW (ref 39.0–52.0)
Hemoglobin: 10.2 g/dL — ABNORMAL LOW (ref 13.0–17.0)
Immature Granulocytes: 1 %
Lymphocytes Relative: 24 %
Lymphs Abs: 1.3 10*3/uL (ref 0.7–4.0)
MCH: 30.2 pg (ref 26.0–34.0)
MCHC: 32.8 g/dL (ref 30.0–36.0)
MCV: 92 fL (ref 80.0–100.0)
Monocytes Absolute: 0.6 10*3/uL (ref 0.1–1.0)
Monocytes Relative: 10 %
Neutro Abs: 3.5 10*3/uL (ref 1.7–7.7)
Neutrophils Relative %: 64 %
Platelet Count: 116 10*3/uL — ABNORMAL LOW (ref 150–400)
RBC: 3.38 MIL/uL — ABNORMAL LOW (ref 4.22–5.81)
RDW: 17 % — ABNORMAL HIGH (ref 11.5–15.5)
WBC Count: 5.5 10*3/uL (ref 4.0–10.5)
nRBC: 0 % (ref 0.0–0.2)

## 2022-01-02 LAB — RETICULOCYTES
Immature Retic Fract: 14.4 % (ref 2.3–15.9)
RBC.: 3.29 MIL/uL — ABNORMAL LOW (ref 4.22–5.81)
Retic Count, Absolute: 53.3 10*3/uL (ref 19.0–186.0)
Retic Ct Pct: 1.6 % (ref 0.4–3.1)

## 2022-01-02 LAB — FERRITIN: Ferritin: 356 ng/mL — ABNORMAL HIGH (ref 24–336)

## 2022-01-02 MED ORDER — DARBEPOETIN ALFA 300 MCG/0.6ML IJ SOSY
300.0000 ug | PREFILLED_SYRINGE | Freq: Once | INTRAMUSCULAR | Status: AC
  Administered 2022-01-02: 300 ug via SUBCUTANEOUS
  Filled 2022-01-02: qty 0.6

## 2022-01-02 NOTE — Telephone Encounter (Signed)
Per 01/02/22 los - gave upcoming appointments - confirmed

## 2022-01-02 NOTE — Progress Notes (Signed)
Hematology and Oncology Follow Up Visit  [name] [id number] 10-11-1928 88 y.o. 01/02/2022   Principle Diagnosis:  Erythropoietin deficiency anemia Iron deficiency anemia  Current Therapy:   IV iron as indicated -- feraheme given on 09/18/2021 Aranesp 300 g sq prn hemoglobin < 11 - last dose given on  09/19/2021   Interim History:  Mr. Shippy is here today for follow-up.  Overall, he seems to be doing pretty well.  I do see him in church every Sunday.  The real problem is that his poor wife is having a lot of back problems.  She is going to see a Licensed conveyancer next week.  Hopefully, they can help her.  I am unsure if she would be a candidate for surgery.  I am sure she probably would be a candidate for some kind of RFA procedure.  His bladder is doing okay.  He has a history of superficial bladder cancer.  He has had no problems with bleeding.  He has had no issues with congestive heart failure.  He was hospitalized back in May for congestive heart failure.  He does have a little swelling in his lower legs.  I am really not surprised by this.  When we last saw him back in July, his ferritin was 344 with an iron saturation 29%.  His appetite is doing okay.  He has had no nausea or vomiting.  There is no change in bowel or bladder habits.  Overall, I would say his performance status is probably ECOG 2.  Medications:  Allergies as of 01/02/2022       Reactions   Lisinopril Other (See Comments)    he has difficulty controlling potassium on ace/arb- lisinopril was dc'd in the past 2/2 to hyperkalemia documented by [facility] P[facility] 01/2021   Penicillins Hives   Has patient had a PCN reaction causing immediate rash, facial/tongue/throat swelling, SOB or lightheadedness with hypotension: No Has patient had a PCN reaction causing severe rash involving mucus membranes or skin necrosis: Yes Has patient had a PCN reaction that required hospitalization: No Has patient had a PCN reaction  occurring within the last 10 years: No If all of the above answers are "NO", then may proceed with Cephalosporin use.   Vancomycin Other (See Comments)   Edema and myalgia        Medication List        Accurate as of January 02, 2022  3:48 PM. If you have any questions, ask your nurse or doctor.          allopurinol 300 MG tablet Commonly known as: ZYLOPRIM Take 0.5 tablets (150 mg total) by mouth daily.   amLODipine 5 MG tablet Commonly known as: NORVASC TAKE 1 TABLET DAILY What changed: when to take this   atorvastatin 20 MG tablet Commonly known as: LIPITOR Take 1 tablet (20 mg total) by mouth at bedtime.   carvedilol 6.25 MG tablet Commonly known as: COREG Take 1 tablet (6.25 mg total) by mouth 2 (two) times daily with a meal.   Cholecalciferol 125 MCG (5000 UT) capsule Take 5,000 Units by mouth daily.   Eliquis 2.5 MG Tabs tablet Generic drug: apixaban TAKE 1 TABLET TWICE A DAY   finasteride 5 MG tablet Commonly known as: PROSCAR Take 5 mg by mouth daily.   fish oil-omega-3 fatty acids 1000 MG capsule Take 1 g by mouth daily.   furosemide 40 MG tablet Commonly known as: LASIX TAKE 1 TABLET BY MOUTH EVERY DAY  latanoprost 0.005 % ophthalmic solution Commonly known as: XALATAN Place 1 drop into both eyes at bedtime.   onetouch ultrasoft lancets Use as instructed   OneTouch Verio test strip Generic drug: glucose blood Use as instructed   potassium chloride 10 MEQ tablet Commonly known as: KLOR-CON Take 1 tablet (10 mEq total) by mouth daily.   PRESERVISION AREDS PO Take 1 tablet by mouth 2 (two) times daily.   PROBIOTIC PO Take 1 tablet by mouth daily.   tamsulosin 0.4 MG Caps capsule Commonly known as: FLOMAX Take 0.4 mg by mouth in the morning and at bedtime.   timolol 0.5 % ophthalmic solution Commonly known as: TIMOPTIC Place 1 drop into both eyes 2 (two) times daily.   UNABLE TO FIND Take 2 tablets by mouth every morning. Med  Name: Mito Q (takes place of Co Q 10)   VANADYL SULFATE PO Take 1 capsule by mouth every evening.        Allergies:  Allergies  Allergen Reactions   Lisinopril Other (See Comments)     he has difficulty controlling potassium on ace/arb- lisinopril was dc'd in the past 2/2 to hyperkalemia documented by [facility] PCP 01/2021   Penicillins Hives    Has patient had a PCN reaction causing immediate rash, facial/tongue/throat swelling, SOB or lightheadedness with hypotension: No Has patient had a PCN reaction causing severe rash involving mucus membranes or skin necrosis: Yes Has patient had a PCN reaction that required hospitalization: No Has patient had a PCN reaction occurring within the last 10 years: No If all of the above answers are "NO", then may proceed with Cephalosporin use.    Vancomycin Other (See Comments)    Edema and myalgia    Past Medical History, Surgical history, Social history, and Family History were reviewed and updated.  Review of Systems: Review of Systems  Constitutional: Negative.   HENT: Negative.    Eyes: Negative.   Respiratory: Negative.    Cardiovascular:  Positive for palpitations.  Gastrointestinal: Negative.   Genitourinary: Negative.   Musculoskeletal: Negative.   Skin: Negative.   Neurological: Negative.   Endo/Heme/Allergies: Negative.   Psychiatric/Behavioral: Negative.       Physical Exam:  weight is 149 lb 12.8 oz (67.9 kg). His oral temperature is 98 F (36.7 C). His blood pressure is 161/54 (abnormal) and his pulse is 60. His respiration is 18 and oxygen saturation is 99%.   Wt Readings from Last 3 Encounters:  01/02/22 149 lb 12.8 oz (67.9 kg)  11/18/21 152 lb 3.2 oz (69 kg)  10/31/21 149 lb (67.6 kg)    Physical Exam Vitals reviewed.  HENT:     Head: Normocephalic and atraumatic.  Eyes:     Pupils: Pupils are equal, round, and reactive to light.  Cardiovascular:     Rate and Rhythm: Normal rate and regular rhythm.      Heart sounds: Normal heart sounds.  Pulmonary:     Effort: Pulmonary effort is normal.     Breath sounds: Normal breath sounds.  Abdominal:     General: Bowel sounds are normal.     Palpations: Abdomen is soft.  Musculoskeletal:        General: Swelling present. No tenderness or deformity. Normal range of motion.     Cervical back: Normal range of motion.     Right lower leg: Edema present.     Left lower leg: Edema present.  Lymphadenopathy:     Cervical: No cervical adenopathy.  Skin:  General: Skin is warm and dry.     Findings: No erythema or rash.  Neurological:     Mental Status: He is alert and oriented to person, place, and time.  Psychiatric:        Behavior: Behavior normal.        Thought Content: Thought content normal.        Judgment: Judgment normal.      Lab Results  Component Value Date   WBC 5.5 01/02/2022   HGB 10.2 (L) 01/02/2022   HCT 31.1 (L) 01/02/2022   MCV 92.0 01/02/2022   PLT 116 (L) 01/02/2022   Lab Results  Component Value Date   FERRITIN 344 (H) 10/31/2021   IRON 77 10/31/2021   TIBC 269 10/31/2021   UIBC 192 10/31/2021   IRONPCTSAT 29 10/31/2021   Lab Results  Component Value Date   RETICCTPCT 1.6 01/02/2022   RBC 3.29 (L) 01/02/2022   No results found for: "KPAFRELGTCHN", "LAMBDASER", "KAPLAMBRATIO" No results found for: "IGGSERUM", "IGA", "IGMSERUM" Lab Results  Component Value Date   TOTALPROTELP 6.0 (L) 12/26/2015   ALBUMINELP 3.4 (L) 12/26/2015   A1GS 0.4 (H) 12/26/2015   A2GS 0.8 12/26/2015   BETS 0.4 12/26/2015   BETA2SER 0.3 12/26/2015   GAMS 0.8 12/26/2015   MSPIKE Not Observed 01/29/2016   SPEI SEE NOTE 12/26/2015     Chemistry      Component Value Date/Time   NA 137 01/02/2022 1451   NA 141 08/11/2017 0924   NA 145 04/09/2017 0905   NA 140 03/28/2016 0853   K 4.8 01/02/2022 1451   K 4.5 04/09/2017 0905   K 4.4 03/28/2016 0853   CL 105 01/02/2022 1451   CL 105 04/09/2017 0905   CO2 24 01/02/2022 1451    CO2 27 04/09/2017 0905   CO2 19 (L) 03/28/2016 0853   BUN 45 (H) 01/02/2022 1451   BUN 27 08/11/2017 0924   BUN 33 (H) 04/09/2017 0905   BUN 34.4 (H) 03/28/2016 0853   CREATININE 2.11 (H) 01/02/2022 1451   CREATININE 1.72 (H) 08/18/2017 1543   CREATININE 1.7 (H) 03/28/2016 0853   GLU 113 07/24/2016 0000      Component Value Date/Time   CALCIUM 9.1 01/02/2022 1451   CALCIUM 9.2 04/09/2017 0905   CALCIUM 9.2 03/28/2016 0853   ALKPHOS 76 01/02/2022 1451   ALKPHOS 69 04/09/2017 0905   ALKPHOS 85 03/28/2016 0853   AST 27 01/02/2022 1451   AST 23 03/28/2016 0853   ALT 29 01/02/2022 1451   ALT 27 04/09/2017 0905   ALT 37 03/28/2016 0853   BILITOT 0.7 01/02/2022 1451   BILITOT 0.79 03/28/2016 0853      Impression and Plan: Mr. Mccook is a very pleasant 88 yo caucasian gentleman with both erythropoietin deficiency and iron deficiency anemia.   We will go ahead and give him a dose of Aranesp.  He has not got 1 since May.  He typically responds to Aranesp for about 2 or 3 months.  I would think that his iron levels should be okay.  I would like to see him back in early November.  I want to see him back before the holiday season to make sure that his blood is as good as possible.  Hopefully, his poor wife will be able to get some relief from the pain that she is in.   [name], MD 9/7/20233:48 PM

## 2022-01-02 NOTE — Patient Instructions (Signed)

## 2022-01-03 LAB — IRON AND IRON BINDING CAPACITY (CC-WL,HP ONLY)
Iron: 46 ug/dL (ref 45–182)
Saturation Ratios: 18 % (ref 17.9–39.5)
TIBC: 252 ug/dL (ref 250–450)
UIBC: 206 ug/dL (ref 117–376)

## 2022-01-17 ENCOUNTER — Ambulatory Visit: Payer: Medicare Other | Admitting: Family Medicine

## 2022-01-20 ENCOUNTER — Encounter: Payer: Self-pay | Admitting: Family Medicine

## 2022-01-20 ENCOUNTER — Ambulatory Visit (INDEPENDENT_AMBULATORY_CARE_PROVIDER_SITE_OTHER): Payer: Medicare Other | Admitting: Family Medicine

## 2022-01-20 VITALS — BP 159/52 | HR 60 | Temp 97.7°F | Ht 68.0 in | Wt 144.0 lb

## 2022-01-20 DIAGNOSIS — I10 Essential (primary) hypertension: Secondary | ICD-10-CM | POA: Diagnosis not present

## 2022-01-20 DIAGNOSIS — D6869 Other thrombophilia: Secondary | ICD-10-CM

## 2022-01-20 DIAGNOSIS — I480 Paroxysmal atrial fibrillation: Secondary | ICD-10-CM | POA: Diagnosis not present

## 2022-01-20 DIAGNOSIS — D631 Anemia in chronic kidney disease: Secondary | ICD-10-CM | POA: Diagnosis not present

## 2022-01-20 DIAGNOSIS — N184 Chronic kidney disease, stage 4 (severe): Secondary | ICD-10-CM | POA: Diagnosis not present

## 2022-01-20 DIAGNOSIS — I5033 Acute on chronic diastolic (congestive) heart failure: Secondary | ICD-10-CM

## 2022-01-20 DIAGNOSIS — D500 Iron deficiency anemia secondary to blood loss (chronic): Secondary | ICD-10-CM | POA: Diagnosis not present

## 2022-01-20 DIAGNOSIS — E7800 Pure hypercholesterolemia, unspecified: Secondary | ICD-10-CM

## 2022-01-20 DIAGNOSIS — C673 Malignant neoplasm of anterior wall of bladder: Secondary | ICD-10-CM | POA: Diagnosis not present

## 2022-01-20 DIAGNOSIS — I25708 Atherosclerosis of coronary artery bypass graft(s), unspecified, with other forms of angina pectoris: Secondary | ICD-10-CM

## 2022-01-20 DIAGNOSIS — Z23 Encounter for immunization: Secondary | ICD-10-CM

## 2022-01-20 DIAGNOSIS — R7303 Prediabetes: Secondary | ICD-10-CM | POA: Diagnosis not present

## 2022-01-20 DIAGNOSIS — E559 Vitamin D deficiency, unspecified: Secondary | ICD-10-CM

## 2022-01-20 LAB — POCT GLYCOSYLATED HEMOGLOBIN (HGB A1C)
HbA1c POC (<> result, manual entry): 5.8 % (ref 4.0–5.6)
HbA1c, POC (controlled diabetic range): 5.8 % (ref 0.0–7.0)
HbA1c, POC (prediabetic range): 5.8 % (ref 5.7–6.4)
Hemoglobin A1C: 5.8 % — AB (ref 4.0–5.6)

## 2022-01-20 MED ORDER — ATORVASTATIN CALCIUM 20 MG PO TABS: 20.0000 mg | ORAL_TABLET | Freq: Every day | ORAL | 3 refills | Status: DC

## 2022-01-20 MED ORDER — CARVEDILOL 6.25 MG PO TABS: 6.2500 mg | ORAL_TABLET | Freq: Two times a day (BID) | ORAL | 1 refills | Status: DC

## 2022-01-20 MED ORDER — ALLOPURINOL 300 MG PO TABS: 150.0000 mg | ORAL_TABLET | Freq: Every day | ORAL | 3 refills | Status: DC

## 2022-01-20 MED ORDER — FUROSEMIDE 40 MG PO TABS: 40.0000 mg | ORAL_TABLET | Freq: Every day | ORAL | 3 refills | Status: DC

## 2022-01-20 MED ORDER — POTASSIUM CHLORIDE ER 10 MEQ PO TBCR: 10.0000 meq | EXTENDED_RELEASE_TABLET | Freq: Every day | ORAL | 1 refills | Status: DC

## 2022-01-20 MED ORDER — AMLODIPINE BESYLATE 5 MG PO TABS: 5.0000 mg | ORAL_TABLET | Freq: Every day | ORAL | 1 refills | Status: DC

## 2022-01-20 NOTE — Progress Notes (Signed)
[name] , [birth year], 88 y.o., male MRN: [id number] Patient Care Team    Relationship Specialty Notifications Start End  [name], DO PCP - General Family Medicine  12/18/15   [name], MD PCP - Cardiology Cardiology  03/17/17   Thompson Grayer, MD PCP - Electrophysiology Cardiology  06/26/21   [name], MD Consulting Physician Ophthalmology  12/18/15   [name], MD Consulting Physician Dermatology  12/18/15   [name], MD Consulting Physician Cardiology  12/18/15   [name], MD Consulting Physician Ophthalmology  12/19/15   [name], MD Consulting Physician Oncology  09/30/16   [name], MD Attending Physician Nephrology  09/30/16   [name], MD Consulting Physician Dermatology  09/30/16   Franchot Gallo, MD Consulting Physician Urology  06/30/19   Inocencio Homes, DPM Consulting Physician Podiatry  06/30/19     Chief Complaint  Patient presents with   Hypertension    [facility]; pt is not fasting    Subjective: [name] is a 88 y.o. male present for [facility] Hypertension//A.Fib/IDA/chronic anticoagulation Patient reports compliance with  Coreg 6.25  twice a day and amlodipine 5 mg QD.  He has been needing to use his Lasix at 40 mg dose schedule daily now.     He takes fish oil supplementation of 1000 mg daily. Patient denies chest pain, shortness of breath, dizziness or lower extremity edema.  Diet: Low sodium diet followed Exercise: very active RF: CKD3, HLD, CAD w/ CABG (1999), cardiac cath 2011 2/2 CP (all grafts normal), PVD, Afib Significant cardiac history of CAD with prior CABG in 1999 -LIMA to the LAD, SVG to IM, OM1, OM 2, SVG to D1, SVG to PDA/PLA, EF 45-50% with mild diffuse hypokinesis. Grafts patent by cath 2011   Nuclear stress test 08/2015 scar, no ischemia LVEF 70% also has atrial fibrillation/flutter in 2014 on Eliquis status post [facility] 10/2014, PVD, claudication.  Cardioversion 04/01/2017.  Pacemaker implant 07/28/2017.   mild RAE   BPH/cancer anterior wall of urinary bladder: Managed by oncology Prediabetes:  He has been a prediabetic -well controlled with by diet alone Gout: Patient reports gout is well controlled on allopurinol 150 mg daily.  Renally dosed CKD/vitamin D deficiency: Patient reports compliance 5000 units vitamin D daily. IBS: Patient reports his IBS is well controlled-but he does have take the Bentyl as needed.  Allergies  Allergen Reactions   Lisinopril Other (See Comments)     he has difficulty controlling potassium on ace/arb- lisinopril was dc'd in the past 2/2 to hyperkalemia documented by [facility] PCP 01/2021   Penicillins Hives    Has patient had a PCN reaction causing immediate rash, facial/tongue/throat swelling, SOB or lightheadedness with hypotension: No Has patient had a PCN reaction causing severe rash involving mucus membranes or skin necrosis: Yes Has patient had a PCN reaction that required hospitalization: No Has patient had a PCN reaction occurring within the last 10 years: No If all of the above answers are "NO", then may proceed with Cephalosporin use.    Vancomycin Other (See Comments)    Edema and myalgia   Social History   Tobacco Use   Smoking status: Former   Smokeless tobacco: Former    Types: Chew    Quit date: 02/03/1969  Substance Use Topics   Alcohol use: No    Alcohol/week: 0.0 standard drinks of alcohol   Past Medical History:  Diagnosis Date   A-fib ([place])  Basal cell carcinoma    skin   Bigeminal rhythm    Bradycardia 06/21/2017   Cancer of anterior wall of urinary bladder ([place]) 07/18/2019   Cataract    Chronic renal insufficiency, stage 3 (moderate) ([place]) 2018   GFR 30s-40s   Coronary artery disease    post bypass   CVD (cardiovascular disease)    Diabetes mellitus without complication ([place])    type 2 diet controlled   Diverticular disease    Easy bruising    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   Gout    Hematuria 06/30/2019    Hernia    Hyperkalemia 05/06/2017   Hyperlipidemia    Hypertension    IBS (irritable bowel syndrome)    Iron deficiency anemia 02/04/2016   Macular degeneration    Microscopic colitis    PVD (peripheral vascular disease) ([place])    Past Surgical History:  Procedure Laterality Date   CARDIAC CATHETERIZATION  2006   CARDIOVERSION N/A 02/22/2013   Procedure: CARDIOVERSION;  Surgeon: [name], MD;  Location: [place];  Service: Cardiovascular;  Laterality: N/A;   CARDIOVERSION N/A 11/15/2014   Procedure: CARDIOVERSION;  Surgeon: [name], MD;  Location: [facility] ENDOSCOPY;  Service: Cardiovascular;  Laterality: N/A;   CARDIOVERSION N/A 04/01/2017   Procedure: CARDIOVERSION;  Surgeon: Larey Dresser, MD;  Location[facility] ENDOSCOPY;  Service: Cardiovascular;  Laterality: N/A;   CAROTID ENDARTERECTOMY  2009/ 1993   left/ right   COLONOSCOPY  03/17/2005   The colon is normal.   CORONARY ARTERY BYPASS GRAFT  1999   CYSTOSCOPY W/ RETROGRADES Bilateral 07/14/2019   Procedure: CYSTOSCOPY WITH RETROGRADE PYELOGRAM;  Surgeon: Franchot Gallo, MD;  Location: [facility];  Service: Urology;  Laterality: Bilateral;   ESOPHAGOGASTRODUODENOSCOPY  03/17/2005   Normal esophagus. Normal Stomanch. Normal duodenum.    EYE SURGERY     eyelid repair   INGUINAL HERNIA REPAIR  02/11/2012   Procedure: LAPAROSCOPIC BILATERAL INGUINAL HERNIA REPAIR;  Surgeon: Pedro Earls, MD;  Location: [facility]S;  Service: General;  Laterality: Bilateral;   PACEMAKER IMPLANT N/A 07/28/2017   [employer] Medical Assurity MRI conditional  dual-chamber pacemaker for symptomatic second degree AV block by Dr Rayann Heman   SKIN CANCER EXCISION     right ear x 3   TRANSURETHRAL RESECTION OF BLADDER TUMOR N/A 09/05/2019   Procedure: TRANSURETHRAL RESECTION OF BLADDER TUMOR (TURBT);  Surgeon: Franchot Gallo, MD;  Location: [facility];  Service: Urology;  Laterality: N/A;   TRANSURETHRAL RESECTION OF  BLADDER TUMOR WITH MITOMYCIN-C N/A 07/14/2019   Procedure: TRANSURETHRAL RESECTION OF BLADDER TUMOR WITH GEMCITABINE IN PACU;  Surgeon: Franchot Gallo, MD;  Location: [facility];  Service: Urology;  Laterality: N/A;   Family History  Problem Relation Age of Onset   Stroke Mother    Coronary artery disease Father    Heart disease Father    Melanoma Sister    Colon cancer Neg Hx    Allergies as of 01/20/2022       Reactions   Lisinopril Other (See Comments)    he has difficulty controlling potassium on ace/arb- lisinopril was dc'd in the past 2/2 to hyperkalemia documented by [facility] P[facility] 01/2021   Penicillins Hives   Has patient had a PCN reaction causing immediate rash, facial/tongue/throat swelling, SOB or lightheadedness with hypotension: No Has patient had a PCN reaction causing severe rash involving mucus membranes or skin necrosis: Yes Has patient had a PCN reaction that required hospitalization: No  Has patient had a PCN reaction occurring within the last 10 years: No If all of the above answers are "NO", then may proceed with Cephalosporin use.   Vancomycin Other (See Comments)   Edema and myalgia        Medication List        Accurate as of January 20, 2022  3:14 PM. If you have any questions, ask your nurse or doctor.          allopurinol 300 MG tablet Commonly known as: ZYLOPRIM Take 0.5 tablets (150 mg total) by mouth daily.   amLODipine 5 MG tablet Commonly known as: NORVASC Take 1 tablet (5 mg total) by mouth at bedtime.   atorvastatin 20 MG tablet Commonly known as: LIPITOR Take 1 tablet (20 mg total) by mouth at bedtime.   carvedilol 6.25 MG tablet Commonly known as: COREG Take 1 tablet (6.25 mg total) by mouth 2 (two) times daily with a meal.   Cholecalciferol 125 MCG (5000 UT) capsule Take 5,000 Units by mouth daily.   Eliquis 2.5 MG Tabs tablet Generic drug: apixaban TAKE 1 TABLET TWICE A DAY   finasteride 5 MG  tablet Commonly known as: PROSCAR Take 5 mg by mouth daily.   fish oil-omega-3 fatty acids 1000 MG capsule Take 1 g by mouth daily.   furosemide 40 MG tablet Commonly known as: LASIX Take 1 tablet (40 mg total) by mouth daily.   latanoprost 0.005 % ophthalmic solution Commonly known as: XALATAN Place 1 drop into both eyes at bedtime.   onetouch ultrasoft lancets Use as instructed   OneTouch Verio test strip Generic drug: glucose blood Use as instructed   potassium chloride 10 MEQ tablet Commonly known as: KLOR-CON Take 1 tablet (10 mEq total) by mouth daily.   PRESERVISION AREDS PO Take 1 tablet by mouth 2 (two) times daily.   PROBIOTIC PO Take 1 tablet by mouth daily.   tamsulosin 0.4 MG Caps capsule Commonly known as: FLOMAX Take 0.4 mg by mouth in the morning and at bedtime.   timolol 0.5 % ophthalmic solution Commonly known as: TIMOPTIC Place 1 drop into both eyes 2 (two) times daily.   UNABLE TO FIND Take 2 tablets by mouth every morning. Med Name: Mito Q (takes place of Co Q 10)   VANADYL SULFATE PO Take 1 capsule by mouth every evening.        Results for orders placed or performed in visit on 01/20/22 (from the past 24 hour(s))  POCT HgB A1C     Status: Abnormal   Collection Time: 01/20/22  3:01 PM  Result Value Ref Range   Hemoglobin A1C 5.8 (A) 4.0 - 5.6 %   HbA1c POC (<> result, manual entry) 5.8 4.0 - 5.6 %   HbA1c, POC (prediabetic range) 5.8 5.7 - 6.4 %   HbA1c, POC (controlled diabetic range) 5.8 0.0 - 7.0 %   No results found.   ROS: Negative, with the exception of above mentioned in HPI  Objective:  BP (!) 159/52   Pulse 60   Temp 97.7 F (36.5 C) (Oral)   Ht '5\' 8"'$  (1.727 m)   Wt 144 lb (65.3 kg)   SpO2 98%   BMI 21.90 kg/m  Body mass index is 21.9 kg/m.  Physical Exam Vitals and nursing note reviewed.  Constitutional:      General: He is not in acute distress.    Appearance: Normal appearance. He is not ill-appearing,  toxic-appearing or diaphoretic.  HENT:  Head: Normocephalic and atraumatic.     Mouth/Throat:     Mouth: Mucous membranes are moist.  Eyes:     General: No scleral icterus.       Right eye: No discharge.        Left eye: No discharge.     Extraocular Movements: Extraocular movements intact.     Pupils: Pupils are equal, round, and reactive to light.  Cardiovascular:     Rate and Rhythm: Normal rate and regular rhythm.  Pulmonary:     Effort: Pulmonary effort is normal. No respiratory distress.     Breath sounds: Normal breath sounds. No wheezing, rhonchi or rales.  Musculoskeletal:     Cervical back: Neck supple.     Right lower leg: No edema.     Left lower leg: No edema.  Lymphadenopathy:     Cervical: No cervical adenopathy.  Skin:    General: Skin is warm and dry.     Coloration: Skin is not jaundiced or pale.     Findings: No rash.  Neurological:     Mental Status: He is alert and oriented to person, place, and time. Mental status is at baseline.  Psychiatric:        Mood and Affect: Mood normal.        Behavior: Behavior normal.        Thought Content: Thought content normal.        Judgment: Judgment normal.      Assessment/Plan: [name] is a 88 y.o. male present for OV for  Essential hypertension/Paroxysmal atrial fibrillation (HCC)/Stage 3 chronic kidney disease/HLD//CHF/CAD/PVD Stable Continue amlodipine 5 mg daily Continue coreg 6.25 mg twice daily. Continue e Eliquis>cardio Lasix 20-40 mg daily  - continue  fish oil supplementation -  he  has difficulty controlling potassium on ace/arb-  lisinopril was dc'd in the past 2/2 to hyperkalemia - now he is on a potasium supplement daily to maintain level> continue K-Dur 10 mEq daily. - Low-sodium diet.  -  He BP has been  very difficult to control at times and his hydration and IDA/epo injections cause fluctuations leading to both highs and lower than desired readings. Certainly would rather mildly  elevated BP over too low. Labs up-to-date - Follow-up 5.5 months, as long as seeing Dr. Johnsie Cancel routinely as well.  Anemias IDA and EPO:  - managed by heme/onc.  Patient reports he is feeling pretty good  Prediabetes Patient has been very well diet controlled 5.7> 6.4> 6.5>5.9>6.5> 6.0> 5.8 A1c collected today  Continue dietary modifications and exercise.  Gout, unspecified cause, unspecified chronicity, unspecified site Stable Continue allopurinol Continue renally dosed allopurinol  Hematuria/BPH/bladder cancer: Patient reports his bladder cancer reported responded well to treatment.  He is prescribed Flomax and Proscar by urology.  CKD 4: Reviewed BMP collected last week by specialty team> GFR < 30 Avoid NSAIDs Renally dose medications PTH/calcium and vitamin D UTD  Influenza vaccine provided today   Meds managed by primary team: [facility]: Lasix, potassium, Bentyl [facility] scripts: Coreg, Lipitor, allopurinol  Orders Placed This Encounter  Procedures   Flu Vaccine QUAD High Dose(Fluad)   POCT HgB A1C   Meds ordered this encounter  Medications   allopurinol (ZYLOPRIM) 300 MG tablet    Sig: Take 0.5 tablets (150 mg total) by mouth daily.    Dispense:  45 tablet    Refill:  3   atorvastatin (LIPITOR) 20 MG tablet    Sig: Take 1 tablet (20 mg total) by  mouth at bedtime.    Dispense:  90 tablet    Refill:  3   carvedilol (COREG) 6.25 MG tablet    Sig: Take 1 tablet (6.25 mg total) by mouth 2 (two) times daily with a meal.    Dispense:  180 tablet    Refill:  1   furosemide (LASIX) 40 MG tablet    Sig: Take 1 tablet (40 mg total) by mouth daily.    Dispense:  90 tablet    Refill:  3   amLODipine (NORVASC) 5 MG tablet    Sig: Take 1 tablet (5 mg total) by mouth at bedtime.    Dispense:  90 tablet    Refill:  1   potassium chloride (KLOR-CON) 10 MEQ tablet    Sig: Take 1 tablet (10 mEq total) by mouth daily.    Dispense:  90 tablet    Refill:  1    Referral  Orders  No referral(s) requested today     electronically signed by:  Howard Pouch, DO  [place]

## 2022-01-28 ENCOUNTER — Ambulatory Visit (INDEPENDENT_AMBULATORY_CARE_PROVIDER_SITE_OTHER): Payer: Medicare Other

## 2022-01-28 DIAGNOSIS — I442 Atrioventricular block, complete: Secondary | ICD-10-CM | POA: Diagnosis not present

## 2022-01-28 LAB — CUP PACEART REMOTE DEVICE CHECK
Battery Remaining Longevity: 65 mo
Battery Remaining Percentage: 56 %
Battery Voltage: 2.99 V
Brady Statistic RV Percent Paced: 99 %
Date Time Interrogation Session: 20231003020035
Implantable Lead Implant Date: 20190402
Implantable Lead Implant Date: 20190402
Implantable Lead Location: 753859
Implantable Lead Location: 753860
Implantable Pulse Generator Implant Date: 20190402
Lead Channel Impedance Value: 450 Ohm
Lead Channel Pacing Threshold Amplitude: 0.5 V
Lead Channel Pacing Threshold Pulse Width: 0.5 ms
Lead Channel Sensing Intrinsic Amplitude: 12 mV
Lead Channel Setting Pacing Amplitude: 2.5 V
Lead Channel Setting Pacing Pulse Width: 0.5 ms
Lead Channel Setting Sensing Sensitivity: 2 mV
Pulse Gen Model: 2272
Pulse Gen Serial Number: 9003454

## 2022-02-10 NOTE — Progress Notes (Signed)
Remote pacemaker transmission.   

## 2022-02-11 DIAGNOSIS — H43813 Vitreous degeneration, bilateral: Secondary | ICD-10-CM | POA: Diagnosis not present

## 2022-02-11 DIAGNOSIS — H353134 Nonexudative age-related macular degeneration, bilateral, advanced atrophic with subfoveal involvement: Secondary | ICD-10-CM | POA: Diagnosis not present

## 2022-02-11 DIAGNOSIS — H35423 Microcystoid degeneration of retina, bilateral: Secondary | ICD-10-CM | POA: Diagnosis not present

## 2022-02-11 DIAGNOSIS — H35433 Paving stone degeneration of retina, bilateral: Secondary | ICD-10-CM | POA: Diagnosis not present

## 2022-02-11 DIAGNOSIS — H35373 Puckering of macula, bilateral: Secondary | ICD-10-CM | POA: Diagnosis not present

## 2022-02-14 ENCOUNTER — Encounter: Payer: Self-pay | Admitting: Family Medicine

## 2022-02-14 ENCOUNTER — Ambulatory Visit (INDEPENDENT_AMBULATORY_CARE_PROVIDER_SITE_OTHER): Payer: Medicare Other | Admitting: Family Medicine

## 2022-02-14 VITALS — BP 140/59 | HR 78 | Temp 97.9°F | Wt 144.6 lb

## 2022-02-14 DIAGNOSIS — L989 Disorder of the skin and subcutaneous tissue, unspecified: Secondary | ICD-10-CM

## 2022-02-14 NOTE — Progress Notes (Signed)
[name] , 1928-08-25, 88 y.o., male MRN: [id number] Patient Care Team    Relationship Specialty Notifications Start End  [name], DO PCP - General Family Medicine  12/18/15   [name], MD PCP - Cardiology Cardiology  03/17/17   Thompson Grayer, MD PCP - Electrophysiology Cardiology  06/26/21   [name], MD Consulting Physician Ophthalmology  12/18/15   [name], MD Consulting Physician Dermatology  12/18/15   [name], MD Consulting Physician Cardiology  12/18/15   [name], MD Consulting Physician Ophthalmology  12/19/15   [name], MD Consulting Physician Oncology  09/30/16   [name], MD Attending Physician Nephrology  09/30/16   [name], MD Consulting Physician Dermatology  09/30/16   Franchot Gallo, MD Consulting Physician Urology  06/30/19   Inocencio Homes, [place] Consulting Physician Podiatry  06/30/19     Chief Complaint  Patient presents with   Rash    Has been using cream and has gotten smaller; no c/o pain; 2 weeks ago     Subjective: Pt presents for an OV with concerns over a skin lesion of his left lower leg.  He reports it was about the size of a golf ball when it presented 2 weeks ago.  He states he just noticed it on his skin and it was not painful, sore or itchy.  Skin has always been intact.  He does experience lower extremity edema secondary to venous insufficiency.  He denies ulceration, drainage or fever from skin lesion.  Reports he has been using baby Goldbond cream on it daily and it has shrunk down to about the size of a dime.     07/12/2021    6:28 PM 07/12/2021    6:26 PM 02/11/2021    2:02 PM 05/02/2020    1:33 PM 11/11/2018    9:58 AM  Depression screen PHQ 2/9  Decreased Interest 0 0 0 0 0  Down, Depressed, Hopeless 0 0 0 0 0  PHQ - 2 Score 0 0 0 0 0    Allergies  Allergen Reactions   Lisinopril Other (See Comments)     he has difficulty controlling potassium on ace/arb- lisinopril was dc'd  in the past 2/2 to hyperkalemia documented by [facility] P[facility] 01/2021   Penicillins Hives    Has patient had a PCN reaction causing immediate rash, facial/tongue/throat swelling, SOB or lightheadedness with hypotension: No Has patient had a PCN reaction causing severe rash involving mucus membranes or skin necrosis: Yes Has patient had a PCN reaction that required hospitalization: No Has patient had a PCN reaction occurring within the last 10 years: No If all of the above answers are "NO", then may proceed with Cephalosporin use.    Vancomycin Other (See Comments)    Edema and myalgia   Social History   Social History Narrative   Married to [place], 2 children [name] and [name].   Some college. Retired from [employer].   Drinks caffeine, uses herbal remedies, takes a daily vitamin.   Wears his seatbelt, smoke detector at home, firearms in the home.   Wears a hearing aid.   Feels safe in her relationships.   Past Medical History:  Diagnosis Date   A-fib [facility])    Basal cell carcinoma    skin   Bigeminal rhythm    Bradycardia 06/21/2017   Cancer of anterior wall of urinary bladder ([place]) 07/18/2019   Cataract    Chronic  renal insufficiency, stage 3 (moderate) ([place]) 2018   GFR 30s-40s   Coronary artery disease    post bypass   CVD (cardiovascular disease)    Diabetes mellitus without complication ([place])    type 2 diet controlled   Diverticular disease    Easy bruising    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   Gout    Hematuria 06/30/2019   Hernia    Hyperkalemia 05/06/2017   Hyperlipidemia    Hypertension    IBS (irritable bowel syndrome)    Iron deficiency anemia 02/04/2016   Macular degeneration    Microscopic colitis    PVD (peripheral vascular disease) ([place])    Past Surgical History:  Procedure Laterality Date   CARDIAC CATHETERIZATION  2006   CARDIOVERSION N/A 02/22/2013   Procedure: CARDIOVERSION;  Surgeon: [name], MD;  Location: [place];   Service: Cardiovascular;  Laterality: N/A;   CARDIOVERSION N/A 11/15/2014   Procedure: CARDIOVERSION;  Surgeon: [name], MD;  Location: [place];  Service: Cardiovascular;  Laterality: N/A;   CARDIOVERSION N/A 04/01/2017   Procedure: CARDIOVERSION;  Surgeon: Larey Dresser, MD;  Location: [place];  Service: Cardiovascular;  Laterality: N/A;   CAROTID ENDARTERECTOMY  2009/ 1993   left/ right   COLONOSCOPY  03/17/2005   The colon is normal.   CORONARY ARTERY BYPASS GRAFT  1999   CYSTOSCOPY W/ RETROGRADES Bilateral 07/14/2019   Procedure: CYSTOSCOPY WITH RETROGRADE PYELOGRAM;  Surgeon: Franchot Gallo, MD;  Location[facility];  Service: Urology;  Laterality: Bilateral;   ESOPHAGOGASTRODUODENOSCOPY  03/17/2005   Normal esophagus. Normal Stomanch. Normal duodenum.    EYE SURGERY     eyelid repair   INGUINAL HERNIA REPAIR  02/11/2012   Procedure: LAPAROSCOPIC BILATERAL INGUINAL HERNIA REPAIR;  Surgeon: Pedro Earls, MD;  Location: [facility];  Service: General;  Laterality: Bilateral;   PACEMAKER IMPLANT N/A 07/28/2017   St Jude Medical Assurity MRI conditional  dual-chamber pacemaker for symptomatic second degree AV block by Dr Rayann Heman   SKIN CANCER EXCISION     right ear x 3   TRANSURETHRAL RESECTION OF BLADDER TUMOR N/A 09/05/2019   Procedure: TRANSURETHRAL RESECTION OF BLADDER TUMOR (TURBT);  Surgeon: Franchot Gallo, MD;  Location[facility];  Service: Urology;  Laterality: N/A;   TRANSURETHRAL RESECTION OF BLADDER TUMOR WITH MITOMYCIN-C N/A 07/14/2019   Procedure: TRANSURETHRAL RESECTION OF BLADDER TUMOR WITH GEMCITABINE IN PACU;  Surgeon: Franchot Gallo, MD;  Location: [facility];  Service: Urology;  Laterality: N/A;   Family History  Problem Relation Age of Onset   Stroke Mother    Coronary artery disease Father    Heart disease Father    Melanoma Sister    Colon cancer Neg Hx    Allergies as of 02/14/2022        Reactions   Lisinopril Other (See Comments)    he has difficulty controlling potassium on ace/arb- lisinopril was dc'd in the past 2/2 to hyperkalemia documented by [facility] PCP 01/2021   Penicillins Hives   Has patient had a PCN reaction causing immediate rash, facial/tongue/throat swelling, SOB or lightheadedness with hypotension: No Has patient had a PCN reaction causing severe rash involving mucus membranes or skin necrosis: Yes Has patient had a PCN reaction that required hospitalization: No Has patient had a PCN reaction occurring within the last 10 years: No If all of the above answers are "NO", then may proceed with Cephalosporin use.   Vancomycin Other (See Comments)  Edema and myalgia        Medication List        Accurate as of February 14, 2022  9:10 AM. If you have any questions, ask your nurse or doctor.          allopurinol 300 MG tablet Commonly known as: ZYLOPRIM Take 0.5 tablets (150 mg total) by mouth daily.   amLODipine 5 MG tablet Commonly known as: NORVASC Take 1 tablet (5 mg total) by mouth at bedtime.   atorvastatin 20 MG tablet Commonly known as: LIPITOR Take 1 tablet (20 mg total) by mouth at bedtime.   carvedilol 6.25 MG tablet Commonly known as: COREG Take 1 tablet (6.25 mg total) by mouth 2 (two) times daily with a meal.   Cholecalciferol 125 MCG (5000 UT) capsule Take 5,000 Units by mouth daily.   Eliquis 2.5 MG Tabs tablet Generic drug: apixaban TAKE 1 TABLET TWICE A DAY   finasteride 5 MG tablet Commonly known as: PROSCAR Take 5 mg by mouth daily.   fish oil-omega-3 fatty acids 1000 MG capsule Take 1 g by mouth daily.   furosemide 40 MG tablet Commonly known as: LASIX Take 1 tablet (40 mg total) by mouth daily.   latanoprost 0.005 % ophthalmic solution Commonly known as: XALATAN Place 1 drop into both eyes at bedtime.   onetouch ultrasoft lancets Use as instructed   OneTouch Verio test strip Generic drug:  glucose blood Use as instructed   potassium chloride 10 MEQ tablet Commonly known as: KLOR-CON Take 1 tablet (10 mEq total) by mouth daily.   PRESERVISION AREDS PO Take 1 tablet by mouth 2 (two) times daily.   PROBIOTIC PO Take 1 tablet by mouth daily.   tamsulosin 0.4 MG Caps capsule Commonly known as: FLOMAX Take 0.4 mg by mouth in the morning and at bedtime.   timolol 0.5 % ophthalmic solution Commonly known as: TIMOPTIC Place 1 drop into both eyes 2 (two) times daily.   UNABLE TO FIND Take 2 tablets by mouth every morning. Med Name: Mito Q (takes place of Co Q 10)   VANADYL SULFATE PO Take 1 capsule by mouth every evening.        All past medical history, surgical history, allergies, family history, immunizations andmedications were updated in the EMR today and reviewed under the history and medication portions of their EMR.     ROS Negative, with the exception of above mentioned in HPI   Objective:  BP (!) 140/59   Pulse 78   Temp 97.9 F (36.6 C)   Wt 144 lb 9.6 oz (65.6 kg)   SpO2 98%   BMI 21.99 kg/m  Body mass index is 21.99 kg/m. Physical Exam Vitals and nursing note reviewed.  Constitutional:      General: He is not in acute distress.    Appearance: Normal appearance. He is not ill-appearing, toxic-appearing or diaphoretic.  HENT:     Head: Normocephalic and atraumatic.  Eyes:     General: No scleral icterus.       Right eye: No discharge.        Left eye: No discharge.     Extraocular Movements: Extraocular movements intact.     Pupils: Pupils are equal, round, and reactive to light.  Skin:    General: Skin is warm and dry.     Coloration: Skin is not jaundiced or pale.     Findings: Lesion (Left lower extremity, anterior shin dime size red, scaly skin lesion.  Skin intact.  No drainage.  No erythema.) present. No rash.  Neurological:     Mental Status: He is alert and oriented to person, place, and time. Mental status is at baseline.   Psychiatric:        Mood and Affect: Mood normal.        Behavior: Behavior normal.        Thought Content: Thought content normal.        Judgment: Judgment normal.      No results found. No results found. No results found for this or any previous visit (from the past 24 hour(s)).  Assessment/Plan: OBINNA EHRESMAN is a 88 y.o. male present for OV for  Skin lesion Reassured patient.  Skin lesion appears to be secondary to venous stasis.  No ulceration present.  Skin is intact.  Patient reports it has improved a great deal. Continue to monitor.  If becomes larger, skin not intact, drainage or erythema present to follow-up at that time. For now continue keeping legs elevated when able and continue placing baby Goldbond cream daily  Reviewed expectations re: course of current medical issues. Discussed self-management of symptoms. Outlined signs and symptoms indicating need for more acute intervention. Patient verbalized understanding and all questions were answered. Patient received an After-Visit Summary.    No orders of the defined types were placed in this encounter.  No orders of the defined types were placed in this encounter.  Referral Orders  No referral(s) requested today     Note is dictated utilizing voice recognition software. Although note has been proof read prior to signing, occasional typographical errors still can be missed. If any questions arise, please do not hesitate to call for verification.   electronically signed by:  Howard Pouch, DO  [place]

## 2022-02-14 NOTE — Patient Instructions (Signed)
Keep using the gold bond on the area. It does not look infected.

## 2022-02-27 ENCOUNTER — Encounter: Payer: Self-pay | Admitting: Hematology & Oncology

## 2022-02-27 ENCOUNTER — Inpatient Hospital Stay (HOSPITAL_BASED_OUTPATIENT_CLINIC_OR_DEPARTMENT_OTHER): Payer: Medicare Other | Admitting: Hematology & Oncology

## 2022-02-27 ENCOUNTER — Inpatient Hospital Stay: Payer: Medicare Other | Attending: Hematology & Oncology

## 2022-02-27 ENCOUNTER — Inpatient Hospital Stay: Payer: Medicare Other

## 2022-02-27 VITALS — BP 158/67 | HR 66 | Temp 97.6°F | Resp 20 | Ht 67.0 in | Wt 148.0 lb

## 2022-02-27 DIAGNOSIS — D509 Iron deficiency anemia, unspecified: Secondary | ICD-10-CM

## 2022-02-27 DIAGNOSIS — N183 Chronic kidney disease, stage 3 unspecified: Secondary | ICD-10-CM | POA: Insufficient documentation

## 2022-02-27 DIAGNOSIS — D631 Anemia in chronic kidney disease: Secondary | ICD-10-CM

## 2022-02-27 DIAGNOSIS — N184 Chronic kidney disease, stage 4 (severe): Secondary | ICD-10-CM | POA: Insufficient documentation

## 2022-02-27 DIAGNOSIS — D508 Other iron deficiency anemias: Secondary | ICD-10-CM

## 2022-02-27 LAB — CMP (CANCER CENTER ONLY)
ALT: 18 U/L (ref 0–44)
AST: 23 U/L (ref 15–41)
Albumin: 3.9 g/dL (ref 3.5–5.0)
Alkaline Phosphatase: 81 U/L (ref 38–126)
Anion gap: 8 (ref 5–15)
BUN: 36 mg/dL — ABNORMAL HIGH (ref 8–23)
CO2: 26 mmol/L (ref 22–32)
Calcium: 9.2 mg/dL (ref 8.9–10.3)
Chloride: 104 mmol/L (ref 98–111)
Creatinine: 2.05 mg/dL — ABNORMAL HIGH (ref 0.61–1.24)
GFR, Estimated: 30 mL/min — ABNORMAL LOW (ref >60)
Glucose, Bld: 177 mg/dL — ABNORMAL HIGH (ref 70–99)
Potassium: 4.4 mmol/L (ref 3.5–5.1)
Sodium: 138 mmol/L (ref 135–145)
Total Bilirubin: 0.5 mg/dL (ref 0.3–1.2)
Total Protein: 7.3 g/dL (ref 6.5–8.1)

## 2022-02-27 LAB — RETICULOCYTES
Immature Retic Fract: 12.1 % (ref 2.3–15.9)
RBC.: 3.54 MIL/uL — ABNORMAL LOW (ref 4.22–5.81)
Retic Count, Absolute: 48.9 10*3/uL (ref 19.0–186.0)
Retic Ct Pct: 1.4 % (ref 0.4–3.1)

## 2022-02-27 LAB — CBC WITH DIFFERENTIAL (CANCER CENTER ONLY)
Abs Immature Granulocytes: 0.01 10*3/uL (ref 0.00–0.07)
Basophils Absolute: 0 10*3/uL (ref 0.0–0.1)
Basophils Relative: 0 %
Eosinophils Absolute: 0.1 10*3/uL (ref 0.0–0.5)
Eosinophils Relative: 2 %
HCT: 33.1 % — ABNORMAL LOW (ref 39.0–52.0)
Hemoglobin: 10.9 g/dL — ABNORMAL LOW (ref 13.0–17.0)
Immature Granulocytes: 0 %
Lymphocytes Relative: 22 %
Lymphs Abs: 1.2 10*3/uL (ref 0.7–4.0)
MCH: 30.8 pg (ref 26.0–34.0)
MCHC: 32.9 g/dL (ref 30.0–36.0)
MCV: 93.5 fL (ref 80.0–100.0)
Monocytes Absolute: 0.5 10*3/uL (ref 0.1–1.0)
Monocytes Relative: 9 %
Neutro Abs: 3.7 10*3/uL (ref 1.7–7.7)
Neutrophils Relative %: 67 %
Platelet Count: 140 10*3/uL — ABNORMAL LOW (ref 150–400)
RBC: 3.54 MIL/uL — ABNORMAL LOW (ref 4.22–5.81)
RDW: 14.7 % (ref 11.5–15.5)
WBC Count: 5.5 10*3/uL (ref 4.0–10.5)
nRBC: 0 % (ref 0.0–0.2)

## 2022-02-27 LAB — FERRITIN: Ferritin: 372 ng/mL — ABNORMAL HIGH (ref 24–336)

## 2022-02-27 MED ORDER — DARBEPOETIN ALFA 300 MCG/0.6ML IJ SOSY
300.0000 ug | PREFILLED_SYRINGE | Freq: Once | INTRAMUSCULAR | Status: AC
  Administered 2022-02-27: 300 ug via SUBCUTANEOUS
  Filled 2022-02-27: qty 0.6

## 2022-02-27 NOTE — Patient Instructions (Signed)

## 2022-02-27 NOTE — Progress Notes (Signed)
Hematology and Oncology Follow Up Visit  [name] [id number] 03-05-1929 88 y.o. 02/27/2022   Principle Diagnosis:  Erythropoietin deficiency anemia Iron deficiency anemia  Current Therapy:   IV iron as indicated -- feraheme given on 09/18/2021 Aranesp 300 g sq prn hemoglobin < 11 - last dose given on  09/19/2021   Interim History:  [name] is here today for follow-up.  He actually comes in with his son.  His son drove him.  [name] had some problems with his macular degeneration.  He is doing okay.  He has had no problems himself.  His poor wife has a lot of spinal problems.  I think she had kyphoplasty done.  It was done recently.  It is hard to say how much this really has helped.  He we will go ahead and get Aranesp today.  We did check his iron studies back in September.  His ferritin was 356 with an iron saturation of 18%.  He has had no problems with his bladder cancer.  He has superficial bladder cancer.  He had intravesicular therapy.  He has had no fever.  He has had no cough or shortness of breath.  He has had a little bit of leg swelling.  He does have chronic renal insufficiency.  This is being followed by Nephrology.  Thankfully, he has had no further issues with respect to congestive heart failure.  Overall, I would say that his performance status is probably ECOG 2.     Medications:  Allergies as of 02/27/2022       Reactions   Lisinopril Other (See Comments)    he has difficulty controlling potassium on ace/arb- lisinopril was dc'd in the past 2/2 to hyperkalemia documented by [facility] PCP 01/2021   Penicillins Hives   Has patient had a PCN reaction causing immediate rash, facial/tongue/throat swelling, SOB or lightheadedness with hypotension: No Has patient had a PCN reaction causing severe rash involving mucus membranes or skin necrosis: Yes Has patient had a PCN reaction that required hospitalization: No Has patient had a PCN reaction  occurring within the last 10 years: No If all of the above answers are "NO", then may proceed with Cephalosporin use.   Vancomycin Other (See Comments)   Edema and myalgia        Medication List        Accurate as of February 27, 2022  3:41 PM. If you have any questions, ask your nurse or doctor.          STOP taking these medications    VANADYL SULFATE PO Stopped by: [name], MD       TAKE these medications    allopurinol 300 MG tablet Commonly known as: ZYLOPRIM Take 0.5 tablets (150 mg total) by mouth daily.   amLODipine 5 MG tablet Commonly known as: NORVASC Take 1 tablet (5 mg total) by mouth at bedtime.   atorvastatin 20 MG tablet Commonly known as: LIPITOR Take 1 tablet (20 mg total) by mouth at bedtime.   carvedilol 6.25 MG tablet Commonly known as: COREG Take 1 tablet (6.25 mg total) by mouth 2 (two) times daily with a meal.   Cholecalciferol 125 MCG (5000 UT) capsule Take 5,000 Units by mouth daily.   dicyclomine 10 MG capsule Commonly known as: BENTYL Take 10 mg by mouth every 6 (six) hours as needed.   Eliquis 2.5 MG Tabs tablet Generic drug: apixaban TAKE 1 TABLET TWICE A DAY   finasteride 5 MG  tablet Commonly known as: PROSCAR Take 5 mg by mouth daily.   fish oil-omega-3 fatty acids 1000 MG capsule Take 1 g by mouth daily.   fluticasone 50 MCG/ACT nasal spray Commonly known as: FLONASE Place 2 sprays into both nostrils daily.   furosemide 40 MG tablet Commonly known as: LASIX Take 1 tablet (40 mg total) by mouth daily.   latanoprost 0.005 % ophthalmic solution Commonly known as: XALATAN Place 1 drop into both eyes at bedtime.   onetouch ultrasoft lancets Use as instructed   OneTouch Verio test strip Generic drug: glucose blood Use as instructed   potassium chloride 10 MEQ tablet Commonly known as: KLOR-CON Take 1 tablet (10 mEq total) by mouth daily.   PRESERVISION AREDS PO Take 1 tablet by mouth 2 (two) times  daily.   PROBIOTIC PO Take 1 tablet by mouth daily.   tamsulosin 0.4 MG Caps capsule Commonly known as: FLOMAX Take 0.4 mg by mouth in the morning and at bedtime.   timolol 0.5 % ophthalmic solution Commonly known as: TIMOPTIC Place 1 drop into both eyes 2 (two) times daily.   UNABLE TO FIND Take 2 tablets by mouth every morning. Med Name: Ester Rink (takes place of Co Q 10)        Allergies:  Allergies  Allergen Reactions   Lisinopril Other (See Comments)     he has difficulty controlling potassium on ace/arb- lisinopril was dc'd in the past 2/2 to hyperkalemia documented by [facility] P[facility] 01/2021   Penicillins Hives    Has patient had a PCN reaction causing immediate rash, facial/tongue/throat swelling, SOB or lightheadedness with hypotension: No Has patient had a PCN reaction causing severe rash involving mucus membranes or skin necrosis: Yes Has patient had a PCN reaction that required hospitalization: No Has patient had a PCN reaction occurring within the last 10 years: No If all of the above answers are "NO", then may proceed with Cephalosporin use.    Vancomycin Other (See Comments)    Edema and myalgia    Past Medical History, Surgical history, Social history, and Family History were reviewed and updated.  Review of Systems: Review of Systems  Constitutional: Negative.   HENT: Negative.    Eyes: Negative.   Respiratory: Negative.    Cardiovascular:  Positive for palpitations.  Gastrointestinal: Negative.   Genitourinary: Negative.   Musculoskeletal: Negative.   Skin: Negative.   Neurological: Negative.   Endo/Heme/Allergies: Negative.   Psychiatric/Behavioral: Negative.       Physical Exam:  height is '5\' 7"'$  (1.702 m) and weight is 148 lb 0.6 oz (67.2 kg). His oral temperature is 97.6 F (36.4 C). His blood pressure is 158/67 (abnormal) and his pulse is 66. His respiration is 20 and oxygen saturation is 98%.   Wt Readings from Last 3 Encounters:  02/27/22  148 lb 0.6 oz (67.2 kg)  02/14/22 144 lb 9.6 oz (65.6 kg)  01/20/22 144 lb (65.3 kg)    Physical Exam Vitals reviewed.  HENT:     Head: Normocephalic and atraumatic.  Eyes:     Pupils: Pupils are equal, round, and reactive to light.  Cardiovascular:     Rate and Rhythm: Normal rate and regular rhythm.     Heart sounds: Normal heart sounds.  Pulmonary:     Effort: Pulmonary effort is normal.     Breath sounds: Normal breath sounds.  Abdominal:     General: Bowel sounds are normal.     Palpations: Abdomen is soft.  Musculoskeletal:        General: Swelling present. No tenderness or deformity. Normal range of motion.     Cervical back: Normal range of motion.     Right lower leg: Edema present.     Left lower leg: Edema present.  Lymphadenopathy:     Cervical: No cervical adenopathy.  Skin:    General: Skin is warm and dry.     Findings: No erythema or rash.  Neurological:     Mental Status: He is alert and oriented to person, place, and time.  Psychiatric:        Behavior: Behavior normal.        Thought Content: Thought content normal.        Judgment: Judgment normal.      Lab Results  Component Value Date   WBC 5.5 02/27/2022   HGB 10.9 (L) 02/27/2022   HCT 33.1 (L) 02/27/2022   MCV 93.5 02/27/2022   PLT 140 (L) 02/27/2022   Lab Results  Component Value Date   FERRITIN 356 (H) 01/02/2022   IRON 46 01/02/2022   TIBC 252 01/02/2022   UIBC 206 01/02/2022   IRONPCTSAT 18 01/02/2022   Lab Results  Component Value Date   RETICCTPCT 1.4 02/27/2022   RBC 3.54 (L) 02/27/2022   RBC 3.54 (L) 02/27/2022   No results found for: "KPAFRELGTCHN", "LAMBDASER", "KAPLAMBRATIO" No results found for: "IGGSERUM", "IGA", "IGMSERUM" Lab Results  Component Value Date   TOTALPROTELP 6.0 (L) 12/26/2015   ALBUMINELP 3.4 (L) 12/26/2015   A1GS 0.4 (H) 12/26/2015   A2GS 0.8 12/26/2015   BETS 0.4 12/26/2015   BETA2SER 0.3 12/26/2015   GAMS 0.8 12/26/2015   MSPIKE Not  Observed 01/29/2016   SPEI SEE NOTE 12/26/2015     Chemistry      Component Value Date/Time   NA 138 02/27/2022 1449   NA 141 08/11/2017 0924   NA 145 04/09/2017 0905   NA 140 03/28/2016 0853   K 4.4 02/27/2022 1449   K 4.5 04/09/2017 0905   K 4.4 03/28/2016 0853   CL 104 02/27/2022 1449   CL 105 04/09/2017 0905   CO2 26 02/27/2022 1449   CO2 27 04/09/2017 0905   CO2 19 (L) 03/28/2016 0853   BUN 36 (H) 02/27/2022 1449   BUN 27 08/11/2017 0924   BUN 33 (H) 04/09/2017 0905   BUN 34.4 (H) 03/28/2016 0853   CREATININE 2.05 (H) 02/27/2022 1449   CREATININE 1.72 (H) 08/18/2017 1543   CREATININE 1.7 (H) 03/28/2016 0853   GLU 113 07/24/2016 0000      Component Value Date/Time   CALCIUM 9.2 02/27/2022 1449   CALCIUM 9.2 04/09/2017 0905   CALCIUM 9.2 03/28/2016 0853   ALKPHOS 81 02/27/2022 1449   ALKPHOS 69 04/09/2017 0905   ALKPHOS 85 03/28/2016 0853   AST 23 02/27/2022 1449   AST 23 03/28/2016 0853   ALT 18 02/27/2022 1449   ALT 27 04/09/2017 0905   ALT 37 03/28/2016 0853   BILITOT 0.5 02/27/2022 1449   BILITOT 0.79 03/28/2016 0853      Impression and Plan: [name] is a very pleasant 88 yo caucasian gentleman with both erythropoietin deficiency and iron deficiency anemia.   We will go ahead and give him a dose of Aranesp.  I realize that his hemoglobin is close to 11.  However, with the holidays coming up, I want to make sure that his hemoglobin stays as high as possible so he will have stamina.  I would think his iron level should be okay.  We will check the levels today.  Hopefully, we can get him back right before Christmas.  I see him in church all the time.  I think he has any problems, he can always let me know in church.     [name], MD 11/2/20233:41 PM

## 2022-02-28 ENCOUNTER — Telehealth: Payer: Self-pay

## 2022-02-28 LAB — IRON AND IRON BINDING CAPACITY (CC-WL,HP ONLY)
Iron: 77 ug/dL (ref 45–182)
Saturation Ratios: 29 % (ref 17.9–39.5)
TIBC: 265 ug/dL (ref 250–450)
UIBC: 188 ug/dL (ref 117–376)

## 2022-02-28 NOTE — Telephone Encounter (Signed)
-----   Message from [name], MD sent at 02/28/2022 11:36 AM EDT ----- Call and let him know that the iron studies look okay.  Thanks.  Laurey Arrow

## 2022-03-18 DIAGNOSIS — E1151 Type 2 diabetes mellitus with diabetic peripheral angiopathy without gangrene: Secondary | ICD-10-CM | POA: Diagnosis not present

## 2022-03-18 DIAGNOSIS — L84 Corns and callosities: Secondary | ICD-10-CM | POA: Diagnosis not present

## 2022-03-18 DIAGNOSIS — I739 Peripheral vascular disease, unspecified: Secondary | ICD-10-CM | POA: Diagnosis not present

## 2022-03-18 DIAGNOSIS — L603 Nail dystrophy: Secondary | ICD-10-CM | POA: Diagnosis not present

## 2022-04-17 ENCOUNTER — Inpatient Hospital Stay (HOSPITAL_BASED_OUTPATIENT_CLINIC_OR_DEPARTMENT_OTHER): Payer: Medicare Other | Admitting: Medical Oncology

## 2022-04-17 ENCOUNTER — Other Ambulatory Visit: Payer: Self-pay

## 2022-04-17 ENCOUNTER — Encounter: Payer: Self-pay | Admitting: Medical Oncology

## 2022-04-17 ENCOUNTER — Inpatient Hospital Stay: Payer: Medicare Other

## 2022-04-17 ENCOUNTER — Inpatient Hospital Stay: Payer: Medicare Other | Attending: Hematology & Oncology

## 2022-04-17 VITALS — BP 160/54 | HR 63 | Temp 97.7°F | Resp 18 | Ht 67.0 in | Wt 151.1 lb

## 2022-04-17 DIAGNOSIS — D508 Other iron deficiency anemias: Secondary | ICD-10-CM

## 2022-04-17 DIAGNOSIS — D631 Anemia in chronic kidney disease: Secondary | ICD-10-CM | POA: Insufficient documentation

## 2022-04-17 DIAGNOSIS — D500 Iron deficiency anemia secondary to blood loss (chronic): Secondary | ICD-10-CM | POA: Diagnosis not present

## 2022-04-17 DIAGNOSIS — N183 Chronic kidney disease, stage 3 unspecified: Secondary | ICD-10-CM | POA: Diagnosis present

## 2022-04-17 DIAGNOSIS — D509 Iron deficiency anemia, unspecified: Secondary | ICD-10-CM

## 2022-04-17 DIAGNOSIS — C673 Malignant neoplasm of anterior wall of bladder: Secondary | ICD-10-CM | POA: Diagnosis not present

## 2022-04-17 DIAGNOSIS — N1831 Chronic kidney disease, stage 3a: Secondary | ICD-10-CM | POA: Diagnosis not present

## 2022-04-17 LAB — CMP (CANCER CENTER ONLY)
ALT: 21 U/L (ref 0–44)
AST: 28 U/L (ref 15–41)
Albumin: 3.8 g/dL (ref 3.5–5.0)
Alkaline Phosphatase: 85 U/L (ref 38–126)
Anion gap: 8 (ref 5–15)
BUN: 35 mg/dL — ABNORMAL HIGH (ref 8–23)
CO2: 25 mmol/L (ref 22–32)
Calcium: 9 mg/dL (ref 8.9–10.3)
Chloride: 106 mmol/L (ref 98–111)
Creatinine: 1.78 mg/dL — ABNORMAL HIGH (ref 0.61–1.24)
GFR, Estimated: 36 mL/min — ABNORMAL LOW (ref >60)
Glucose, Bld: 143 mg/dL — ABNORMAL HIGH (ref 70–99)
Potassium: 4.5 mmol/L (ref 3.5–5.1)
Sodium: 139 mmol/L (ref 135–145)
Total Bilirubin: 0.5 mg/dL (ref 0.3–1.2)
Total Protein: 7.1 g/dL (ref 6.5–8.1)

## 2022-04-17 LAB — CBC WITH DIFFERENTIAL (CANCER CENTER ONLY)
Abs Immature Granulocytes: 0.03 10*3/uL (ref 0.00–0.07)
Basophils Absolute: 0 10*3/uL (ref 0.0–0.1)
Basophils Relative: 0 %
Eosinophils Absolute: 0.2 10*3/uL (ref 0.0–0.5)
Eosinophils Relative: 4 %
HCT: 32.8 % — ABNORMAL LOW (ref 39.0–52.0)
Hemoglobin: 10.6 g/dL — ABNORMAL LOW (ref 13.0–17.0)
Immature Granulocytes: 1 %
Lymphocytes Relative: 18 %
Lymphs Abs: 1 10*3/uL (ref 0.7–4.0)
MCH: 28.8 pg (ref 26.0–34.0)
MCHC: 32.3 g/dL (ref 30.0–36.0)
MCV: 89.1 fL (ref 80.0–100.0)
Monocytes Absolute: 0.5 10*3/uL (ref 0.1–1.0)
Monocytes Relative: 9 %
Neutro Abs: 4 10*3/uL (ref 1.7–7.7)
Neutrophils Relative %: 68 %
Platelet Count: 148 10*3/uL — ABNORMAL LOW (ref 150–400)
RBC: 3.68 MIL/uL — ABNORMAL LOW (ref 4.22–5.81)
RDW: 16.1 % — ABNORMAL HIGH (ref 11.5–15.5)
WBC Count: 5.7 10*3/uL (ref 4.0–10.5)
nRBC: 0 % (ref 0.0–0.2)

## 2022-04-17 LAB — IRON AND IRON BINDING CAPACITY (CC-WL,HP ONLY)
Iron: 50 ug/dL (ref 45–182)
Saturation Ratios: 20 % (ref 17.9–39.5)
TIBC: 251 ug/dL (ref 250–450)
UIBC: 201 ug/dL (ref 117–376)

## 2022-04-17 LAB — RETICULOCYTES
Immature Retic Fract: 20.3 % — ABNORMAL HIGH (ref 2.3–15.9)
RBC.: 3.64 MIL/uL — ABNORMAL LOW (ref 4.22–5.81)
Retic Count, Absolute: 44.4 10*3/uL (ref 19.0–186.0)
Retic Ct Pct: 1.2 % (ref 0.4–3.1)

## 2022-04-17 LAB — FERRITIN: Ferritin: 389 ng/mL — ABNORMAL HIGH (ref 24–336)

## 2022-04-17 MED ORDER — DARBEPOETIN ALFA 300 MCG/0.6ML IJ SOSY
300.0000 ug | PREFILLED_SYRINGE | Freq: Once | INTRAMUSCULAR | Status: AC
  Administered 2022-04-17: 300 ug via SUBCUTANEOUS
  Filled 2022-04-17: qty 0.6

## 2022-04-17 NOTE — Patient Instructions (Signed)

## 2022-04-17 NOTE — Progress Notes (Signed)
Hematology and Oncology Follow Up Visit  [name] [id number] [birth year] 88 y.o. 04/17/2022   Principle Diagnosis:  Erythropoietin deficiency anemia Iron deficiency anemia  Current Therapy:   IV iron as indicated -- feraheme given on 09/18/2021 Aranesp 300 g sq prn hemoglobin < 11 - last dose given on 02/27/2022   Interim History:  [name] is here today for follow-up.  He presents with his daughter in law.   He has been doing well. Recently the whole family had a bit of a viral illness but this has resolved. He is feeling well. No SOB, recent fever. Appetite is strong. Monitoring his weight closely- his CHF has been well controlled.   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   S/p intravesicular therapy. No signs or symptoms currently   He is followed by Nephrology for his CKD which is actually improved a bit recently.  Overall, I would say that his performance status is probably ECOG 2.     Medications:  Allergies as of 04/17/2022       Reactions   Lisinopril Other (See Comments)    he has difficulty controlling potassium on ace/arb- lisinopril was dc'd in the past 2/2 to hyperkalemia documented by [facility] P[facility] 01/2021   Penicillins Hives   Has patient had a PCN reaction causing immediate rash, facial/tongue/throat swelling, SOB or lightheadedness with hypotension: No Has patient had a PCN reaction causing severe rash involving mucus membranes or skin necrosis: Yes Has patient had a PCN reaction that required hospitalization: No Has patient had a PCN reaction occurring within the last 10 years: No If all of the above answers are "NO", then may proceed with Cephalosporin use.   Vancomycin Other (See Comments)   Edema and myalgia        Medication List        Accurate as of April 17, 2022 12:01 PM. If you have any questions, ask your nurse or doctor.          allopurinol 300 MG tablet Commonly known as: ZYLOPRIM Take 0.5  tablets (150 mg total) by mouth daily.   amLODipine 5 MG tablet Commonly known as: NORVASC Take 1 tablet (5 mg total) by mouth at bedtime.   atorvastatin 20 MG tablet Commonly known as: LIPITOR Take 1 tablet (20 mg total) by mouth at bedtime.   carvedilol 6.25 MG tablet Commonly known as: COREG Take 1 tablet (6.25 mg total) by mouth 2 (two) times daily with a meal.   Cholecalciferol 125 MCG (5000 UT) capsule Take 5,000 Units by mouth daily.   dicyclomine 10 MG capsule Commonly known as: BENTYL Take 10 mg by mouth every 6 (six) hours as needed.   Eliquis 2.5 MG Tabs tablet Generic drug: apixaban TAKE 1 TABLET TWICE A DAY   finasteride 5 MG tablet Commonly known as: PROSCAR Take 5 mg by mouth daily.   fish oil-omega-3 fatty acids 1000 MG capsule Take 1 g by mouth daily.   fluticasone 50 MCG/ACT nasal spray Commonly known as: FLONASE Place 2 sprays into both nostrils daily.   furosemide 40 MG tablet Commonly known as: LASIX Take 1 tablet (40 mg total) by mouth daily.   latanoprost 0.005 % ophthalmic solution Commonly known as: XALATAN Place 1 drop into both eyes at bedtime.   onetouch ultrasoft lancets Use as instructed   OneTouch Verio test strip Generic drug: glucose blood Use as instructed   potassium chloride 10 MEQ tablet Commonly known as: KLOR-CON Take  1 tablet (10 mEq total) by mouth daily.   PRESERVISION AREDS PO Take 1 tablet by mouth 2 (two) times daily.   PROBIOTIC PO Take 1 tablet by mouth daily.   tamsulosin 0.4 MG Caps capsule Commonly known as: FLOMAX Take 0.4 mg by mouth in the morning and at bedtime.   timolol 0.5 % ophthalmic solution Commonly known as: TIMOPTIC Place 1 drop into both eyes 2 (two) times daily.   UNABLE TO FIND Take 2 tablets by mouth every morning. Med Name: Ester Rink (takes place of Co Q 10)        Allergies:  Allergies  Allergen Reactions   Lisinopril Other (See Comments)     he has difficulty controlling  potassium on ace/arb- lisinopril was dc'd in the past 2/2 to hyperkalemia documented by [facility] P[facility] 01/2021   Penicillins Hives    Has patient had a PCN reaction causing immediate rash, facial/tongue/throat swelling, SOB or lightheadedness with hypotension: No Has patient had a PCN reaction causing severe rash involving mucus membranes or skin necrosis: Yes Has patient had a PCN reaction that required hospitalization: No Has patient had a PCN reaction occurring within the last 10 years: No If all of the above answers are "NO", then may proceed with Cephalosporin use.    Vancomycin Other (See Comments)    Edema and myalgia    Past Medical History, Surgical history, Social history, and Family History were reviewed and updated.  Review of Systems: Review of Systems  Constitutional: Negative.   HENT: Negative.    Eyes: Negative.   Respiratory: Negative.    Cardiovascular:  Negative for palpitations.  Gastrointestinal: Negative.   Genitourinary: Negative.   Musculoskeletal: Negative.   Skin: Negative.   Neurological: Negative.   Endo/Heme/Allergies: Negative.   Psychiatric/Behavioral: Negative.       Physical Exam:  height is '5\' 7"'$  (1.702 m) and weight is 151 lb 1.3 oz (68.5 kg). His oral temperature is 97.7 F (36.5 C). His blood pressure is 160/54 (abnormal) and his pulse is 63. His respiration is 18 and oxygen saturation is 100%.   Wt Readings from Last 3 Encounters:  04/17/22 151 lb 1.3 oz (68.5 kg)  02/27/22 148 lb 0.6 oz (67.2 kg)  02/14/22 144 lb 9.6 oz (65.6 kg)    Physical Exam Vitals reviewed.  HENT:     Head: Normocephalic and atraumatic.  Eyes:     Pupils: Pupils are equal, round, and reactive to light.  Cardiovascular:     Rate and Rhythm: Normal rate and regular rhythm.     Heart sounds: Normal heart sounds.  Pulmonary:     Effort: Pulmonary effort is normal.     Breath sounds: Normal breath sounds.  Abdominal:     General: Bowel sounds are normal.      Palpations: Abdomen is soft.  Musculoskeletal:        General: Swelling present. No tenderness or deformity. Normal range of motion.     Cervical back: Normal range of motion.     Right lower leg: No edema.     Left lower leg: No edema.  Lymphadenopathy:     Cervical: No cervical adenopathy.  Skin:    General: Skin is warm and dry.     Findings: No erythema or rash.  Neurological:     Mental Status: He is alert and oriented to person, place, and time.  Psychiatric:        Behavior: Behavior normal.  Thought Content: Thought content normal.        Judgment: Judgment normal.     Lab Results  Component Value Date   WBC 5.7 04/17/2022   HGB 10.6 (L) 04/17/2022   HCT 32.8 (L) 04/17/2022   MCV 89.1 04/17/2022   PLT 148 (L) 04/17/2022   Lab Results  Component Value Date   FERRITIN 372 (H) 02/27/2022   IRON 77 02/27/2022   TIBC 265 02/27/2022   UIBC 188 02/27/2022   IRONPCTSAT 29 02/27/2022   Lab Results  Component Value Date   RETICCTPCT 1.2 04/17/2022   RBC 3.64 (L) 04/17/2022   No results found for: "KPAFRELGTCHN", "LAMBDASER", "KAPLAMBRATIO" No results found for: "IGGSERUM", "IGA", "IGMSERUM" Lab Results  Component Value Date   TOTALPROTELP 6.0 (L) 12/26/2015   ALBUMINELP 3.4 (L) 12/26/2015   A1GS 0.4 (H) 12/26/2015   A2GS 0.8 12/26/2015   BETS 0.4 12/26/2015   BETA2SER 0.3 12/26/2015   GAMS 0.8 12/26/2015   MSPIKE Not Observed 01/29/2016   SPEI SEE NOTE 12/26/2015     Chemistry      Component Value Date/Time   NA 139 04/17/2022 1105   NA 141 08/11/2017 0924   NA 145 04/09/2017 0905   NA 140 03/28/2016 0853   K 4.5 04/17/2022 1105   K 4.5 04/09/2017 0905   K 4.4 03/28/2016 0853   CL 106 04/17/2022 1105   CL 105 04/09/2017 0905   CO2 25 04/17/2022 1105   CO2 27 04/09/2017 0905   CO2 19 (L) 03/28/2016 0853   BUN 35 (H) 04/17/2022 1105   BUN 27 08/11/2017 0924   BUN 33 (H) 04/09/2017 0905   BUN 34.4 (H) 03/28/2016 0853   CREATININE 1.78 (H)  04/17/2022 1105   CREATININE 1.72 (H) 08/18/2017 1543   CREATININE 1.7 (H) 03/28/2016 0853   GLU 113 07/24/2016 0000      Component Value Date/Time   CALCIUM 9.0 04/17/2022 1105   CALCIUM 9.2 04/09/2017 0905   CALCIUM 9.2 03/28/2016 0853   ALKPHOS 85 04/17/2022 1105   ALKPHOS 69 04/09/2017 0905   ALKPHOS 85 03/28/2016 0853   AST 28 04/17/2022 1105   AST 23 03/28/2016 0853   ALT 21 04/17/2022 1105   ALT 27 04/09/2017 0905   ALT 37 03/28/2016 0853   BILITOT 0.5 04/17/2022 1105   BILITOT 0.79 03/28/2016 0853      Impression and Plan: Mr. Gloor is a very pleasant 88 yo caucasian gentleman with both erythropoietin deficiency and iron deficiency anemia.   Aranesp today given Hgb of 10.6. Overall he is doing really well.   Last ferritin and TIBC were elevated so I expect these to be ok but will supplement if needed.   Disposition: Aranesp today RTC 6 weeks MD, labs +- [place], PA-C 12/21/202312:01 PM

## 2022-04-18 ENCOUNTER — Telehealth: Payer: Self-pay

## 2022-04-18 NOTE — Telephone Encounter (Signed)
-----   Message from [name], MD sent at 04/17/2022  4:49 PM EST ----- Call and let him know that the iron level is okay.  Thanks.[name]

## 2022-04-29 ENCOUNTER — Ambulatory Visit (INDEPENDENT_AMBULATORY_CARE_PROVIDER_SITE_OTHER): Payer: Medicare Other

## 2022-04-29 DIAGNOSIS — I442 Atrioventricular block, complete: Secondary | ICD-10-CM | POA: Diagnosis not present

## 2022-04-29 LAB — CUP PACEART REMOTE DEVICE CHECK
Battery Remaining Longevity: 62 mo
Battery Remaining Percentage: 54 %
Battery Voltage: 2.99 V
Brady Statistic RV Percent Paced: 99 %
Date Time Interrogation Session: 20240102020012
Implantable Lead Connection Status: 753985
Implantable Lead Connection Status: 753985
Implantable Lead Implant Date: 20190402
Implantable Lead Implant Date: 20190402
Implantable Lead Location: 753859
Implantable Lead Location: 753860
Implantable Pulse Generator Implant Date: 20190402
Lead Channel Impedance Value: 430 Ohm
Lead Channel Pacing Threshold Amplitude: 0.5 V
Lead Channel Pacing Threshold Pulse Width: 0.5 ms
Lead Channel Sensing Intrinsic Amplitude: 12 mV
Lead Channel Setting Pacing Amplitude: 2.5 V
Lead Channel Setting Pacing Pulse Width: 0.5 ms
Lead Channel Setting Sensing Sensitivity: 2 mV
Pulse Gen Model: 2272
Pulse Gen Serial Number: 9003454

## 2022-05-12 ENCOUNTER — Inpatient Hospital Stay (HOSPITAL_BASED_OUTPATIENT_CLINIC_OR_DEPARTMENT_OTHER)
Admission: EM | Admit: 2022-05-12 | Discharge: 2022-05-17 | DRG: 291 | Disposition: A | Discharge status: Home / Self Care | Payer: Medicare Other | Attending: Internal Medicine | Admitting: Internal Medicine

## 2022-05-12 ENCOUNTER — Other Ambulatory Visit: Payer: Self-pay

## 2022-05-12 ENCOUNTER — Encounter (HOSPITAL_COMMUNITY): Payer: Self-pay

## 2022-05-12 ENCOUNTER — Encounter (HOSPITAL_BASED_OUTPATIENT_CLINIC_OR_DEPARTMENT_OTHER): Payer: Self-pay | Admitting: *Deleted

## 2022-05-12 ENCOUNTER — Emergency Department (HOSPITAL_BASED_OUTPATIENT_CLINIC_OR_DEPARTMENT_OTHER): Payer: Medicare Other

## 2022-05-12 DIAGNOSIS — I5023 Acute on chronic systolic (congestive) heart failure: Secondary | ICD-10-CM | POA: Diagnosis present

## 2022-05-12 DIAGNOSIS — Z7951 Long term (current) use of inhaled steroids: Secondary | ICD-10-CM

## 2022-05-12 DIAGNOSIS — E876 Hypokalemia: Secondary | ICD-10-CM | POA: Diagnosis not present

## 2022-05-12 DIAGNOSIS — I10 Essential (primary) hypertension: Secondary | ICD-10-CM | POA: Diagnosis present

## 2022-05-12 DIAGNOSIS — I509 Heart failure, unspecified: Secondary | ICD-10-CM | POA: Diagnosis not present

## 2022-05-12 DIAGNOSIS — I110 Hypertensive heart disease with heart failure: Secondary | ICD-10-CM | POA: Diagnosis not present

## 2022-05-12 DIAGNOSIS — I442 Atrioventricular block, complete: Secondary | ICD-10-CM | POA: Diagnosis present

## 2022-05-12 DIAGNOSIS — E1165 Type 2 diabetes mellitus with hyperglycemia: Secondary | ICD-10-CM | POA: Diagnosis present

## 2022-05-12 DIAGNOSIS — E1122 Type 2 diabetes mellitus with diabetic chronic kidney disease: Secondary | ICD-10-CM | POA: Diagnosis present

## 2022-05-12 DIAGNOSIS — I4891 Unspecified atrial fibrillation: Secondary | ICD-10-CM | POA: Diagnosis present

## 2022-05-12 DIAGNOSIS — E1151 Type 2 diabetes mellitus with diabetic peripheral angiopathy without gangrene: Secondary | ICD-10-CM | POA: Diagnosis present

## 2022-05-12 DIAGNOSIS — Z79899 Other long term (current) drug therapy: Secondary | ICD-10-CM

## 2022-05-12 DIAGNOSIS — Z87891 Personal history of nicotine dependence: Secondary | ICD-10-CM

## 2022-05-12 DIAGNOSIS — R8290 Unspecified abnormal findings in urine: Secondary | ICD-10-CM | POA: Diagnosis present

## 2022-05-12 DIAGNOSIS — Z8551 Personal history of malignant neoplasm of bladder: Secondary | ICD-10-CM | POA: Diagnosis not present

## 2022-05-12 DIAGNOSIS — J90 Pleural effusion, not elsewhere classified: Secondary | ICD-10-CM | POA: Diagnosis not present

## 2022-05-12 DIAGNOSIS — I2510 Atherosclerotic heart disease of native coronary artery without angina pectoris: Secondary | ICD-10-CM | POA: Diagnosis present

## 2022-05-12 DIAGNOSIS — Z888 Allergy status to other drugs, medicaments and biological substances status: Secondary | ICD-10-CM

## 2022-05-12 DIAGNOSIS — Z1152 Encounter for screening for COVID-19: Secondary | ICD-10-CM

## 2022-05-12 DIAGNOSIS — Z823 Family history of stroke: Secondary | ICD-10-CM

## 2022-05-12 DIAGNOSIS — Z794 Long term (current) use of insulin: Secondary | ICD-10-CM | POA: Diagnosis not present

## 2022-05-12 DIAGNOSIS — R0902 Hypoxemia: Secondary | ICD-10-CM | POA: Diagnosis present

## 2022-05-12 DIAGNOSIS — E785 Hyperlipidemia, unspecified: Secondary | ICD-10-CM | POA: Diagnosis present

## 2022-05-12 DIAGNOSIS — Z85828 Personal history of other malignant neoplasm of skin: Secondary | ICD-10-CM

## 2022-05-12 DIAGNOSIS — I130 Hypertensive heart and chronic kidney disease with heart failure and stage 1 through stage 4 chronic kidney disease, or unspecified chronic kidney disease: Secondary | ICD-10-CM | POA: Diagnosis present

## 2022-05-12 DIAGNOSIS — I480 Paroxysmal atrial fibrillation: Secondary | ICD-10-CM | POA: Diagnosis present

## 2022-05-12 DIAGNOSIS — N1832 Chronic kidney disease, stage 3b: Secondary | ICD-10-CM | POA: Diagnosis present

## 2022-05-12 DIAGNOSIS — Z66 Do not resuscitate: Secondary | ICD-10-CM | POA: Diagnosis present

## 2022-05-12 DIAGNOSIS — Z951 Presence of aortocoronary bypass graft: Secondary | ICD-10-CM | POA: Diagnosis not present

## 2022-05-12 DIAGNOSIS — Z7901 Long term (current) use of anticoagulants: Secondary | ICD-10-CM | POA: Diagnosis not present

## 2022-05-12 DIAGNOSIS — Z955 Presence of coronary angioplasty implant and graft: Secondary | ICD-10-CM | POA: Diagnosis not present

## 2022-05-12 DIAGNOSIS — D631 Anemia in chronic kidney disease: Secondary | ICD-10-CM | POA: Diagnosis present

## 2022-05-12 DIAGNOSIS — R0602 Shortness of breath: Secondary | ICD-10-CM | POA: Diagnosis not present

## 2022-05-12 DIAGNOSIS — Z8249 Family history of ischemic heart disease and other diseases of the circulatory system: Secondary | ICD-10-CM

## 2022-05-12 DIAGNOSIS — Z950 Presence of cardiac pacemaker: Secondary | ICD-10-CM

## 2022-05-12 DIAGNOSIS — D649 Anemia, unspecified: Secondary | ICD-10-CM | POA: Diagnosis present

## 2022-05-12 DIAGNOSIS — Z880 Allergy status to penicillin: Secondary | ICD-10-CM

## 2022-05-12 DIAGNOSIS — J9811 Atelectasis: Secondary | ICD-10-CM | POA: Diagnosis not present

## 2022-05-12 LAB — CBC
HCT: 33.8 % — ABNORMAL LOW (ref 39.0–52.0)
Hemoglobin: 10.8 g/dL — ABNORMAL LOW (ref 13.0–17.0)
MCH: 28.5 pg (ref 26.0–34.0)
MCHC: 32 g/dL (ref 30.0–36.0)
MCV: 89.2 fL (ref 80.0–100.0)
Platelets: 166 10*3/uL (ref 150–400)
RBC: 3.79 MIL/uL — ABNORMAL LOW (ref 4.22–5.81)
RDW: 16.9 % — ABNORMAL HIGH (ref 11.5–15.5)
WBC: 5.5 10*3/uL (ref 4.0–10.5)
nRBC: 0 % (ref 0.0–0.2)

## 2022-05-12 LAB — COMPREHENSIVE METABOLIC PANEL
ALT: 18 U/L (ref 0–44)
AST: 28 U/L (ref 15–41)
Albumin: 3.1 g/dL — ABNORMAL LOW (ref 3.5–5.0)
Alkaline Phosphatase: 90 U/L (ref 38–126)
Anion gap: 9 (ref 5–15)
BUN: 41 mg/dL — ABNORMAL HIGH (ref 8–23)
CO2: 22 mmol/L (ref 22–32)
Calcium: 8.6 mg/dL — ABNORMAL LOW (ref 8.9–10.3)
Chloride: 103 mmol/L (ref 98–111)
Creatinine, Ser: 1.97 mg/dL — ABNORMAL HIGH (ref 0.61–1.24)
GFR, Estimated: 31 mL/min — ABNORMAL LOW (ref >60)
Glucose, Bld: 228 mg/dL — ABNORMAL HIGH (ref 70–99)
Potassium: 4 mmol/L (ref 3.5–5.1)
Sodium: 134 mmol/L — ABNORMAL LOW (ref 135–145)
Total Bilirubin: 0.8 mg/dL (ref 0.3–1.2)
Total Protein: 7 g/dL (ref 6.5–8.1)

## 2022-05-12 LAB — TROPONIN I (HIGH SENSITIVITY)
Troponin I (High Sensitivity): 34 ng/L — ABNORMAL HIGH (ref <18)
Troponin I (High Sensitivity): 35 ng/L — ABNORMAL HIGH (ref <18)

## 2022-05-12 LAB — RESP PANEL BY RT-PCR (RSV, FLU A&B, COVID)  RVPGX2
Influenza A by PCR: NEGATIVE
Influenza B by PCR: NEGATIVE
Resp Syncytial Virus by PCR: NEGATIVE
SARS Coronavirus 2 by RT PCR: NEGATIVE

## 2022-05-12 LAB — BRAIN NATRIURETIC PEPTIDE: B Natriuretic Peptide: 1574.9 pg/mL — ABNORMAL HIGH (ref 0.0–100.0)

## 2022-05-12 MED ORDER — FUROSEMIDE 10 MG/ML IJ SOLN
40.0000 mg | Freq: Once | INTRAMUSCULAR | Status: AC
  Administered 2022-05-12: 40 mg via INTRAVENOUS
  Filled 2022-05-12: qty 4

## 2022-05-12 NOTE — ED Triage Notes (Signed)
Pt has had URI with cold and congestion since last week.  Pt reports that sob has gooten "a little bit worse" today.  No CP with this.  Pt has had some coughing with this, no fever but pt has had fatigue.

## 2022-05-12 NOTE — ED Notes (Signed)
Patient transported to X-ray 

## 2022-05-12 NOTE — ED Provider Triage Note (Signed)
Emergency Medicine Provider Triage Evaluation Note  [name] , a 89 y.o. male  was evaluated in triage.  Pt complains of sob. Hx fof chf. Increased swelling in legs. Recent exposure to URI  Review of Systems  Positive: sob Negative: fever  Physical Exam  BP 137/66 (BP Location: Left Arm)   Pulse (!) 59   Temp 98.3 F (36.8 C) (Oral)   Resp 20   Ht '5\' 4"'$  (1.626 m)   Wt 65.3 kg   SpO2 95%   BMI 24.72 kg/m  Gen:   Awake, no distress   Resp:  Normal effort  MSK:   Moves extremities without difficulty  Other:  R>L pitting edema( nml for pt due to saphenous vain hvst on R)  Medical Decision Making  Medically screening exam initiated at 3:16 PM.  Appropriate orders placed.  [name] was informed that the remainder of the evaluation will be completed by another provider, this initial triage assessment does not replace that evaluation, and the importance of remaining in the ED until their evaluation is complete.     Margarita Mail, PA-C 05/12/22 1539

## 2022-05-12 NOTE — ED Notes (Signed)
Fortunately pt appears in no distress at this time.  MSE by EDPA in triage

## 2022-05-12 NOTE — ED Provider Notes (Signed)
[facility] EMERGENCY DEPARTMENT Provider Note   CSN: [id number] Arrival date & time: 05/12/22  1450     History  Chief Complaint  Patient presents with   Shortness of Breath    [name] is a 89 y.o. male.   Shortness of Breath    Patient with medical history of CHF, CAD, hyperlipidemia, prediabetes presents to the emergency department due to shortness of breath and cough.  Over the last week and a half he has been having cough and nasal congestion.  The cough itself is nonproductive.  He started feeling short of breath over the weekend, is worse with any exertion.  This morning he was trying to get up and he felt really winded bowling and forward.  He denies any chest pain or back pain, he does feel like there is a pressure in his upper abdomen and chest.  He has not gained any weight, he is on 40 mg Lasix once daily.  Home Medications Prior to Admission medications   Medication Sig Start Date End Date Taking? Authorizing Provider  allopurinol (ZYLOPRIM) 300 MG tablet Take 0.5 tablets (150 mg total) by mouth daily. 01/20/22   Kuneff, Renee A, DO  amLODipine (NORVASC) 5 MG tablet Take 1 tablet (5 mg total) by mouth at bedtime. 01/20/22   Kuneff, Renee A, DO  atorvastatin (LIPITOR) 20 MG tablet Take 1 tablet (20 mg total) by mouth at bedtime. 01/20/22   Kuneff, Renee A, DO  carvedilol (COREG) 6.25 MG tablet Take 1 tablet (6.25 mg total) by mouth 2 (two) times daily with a meal. 01/20/22   Kuneff, Renee A, DO  Cholecalciferol 125 MCG (5000 UT) capsule Take 5,000 Units by mouth daily.    [provider]  dicyclomine (BENTYL) 10 MG capsule Take 10 mg by mouth every 6 (six) hours as needed. 02/02/22   [provider]  ELIQUIS 2.5 MG TABS tablet TAKE 1 TABLET TWICE A DAY 12/06/21   [name], MD  finasteride (PROSCAR) 5 MG tablet Take 5 mg by mouth daily. 08/24/17   [provider]  fish oil-omega-3 fatty acids 1000 MG capsule Take 1 g by mouth  daily.     [provider]  fluticasone (FLONASE) 50 MCG/ACT nasal spray Place 2 sprays into both nostrils daily. 02/13/22   [provider]  furosemide (LASIX) 40 MG tablet Take 1 tablet (40 mg total) by mouth daily. 01/20/22   Kuneff, Renee A, DO  glucose blood (ONETOUCH VERIO) test strip Use as instructed 08/12/21   Raoul Pitch, Renee A, DO  Lancets (ONETOUCH ULTRASOFT) lancets Use as instructed 11/26/21   Kuneff, Renee A, DO  latanoprost (XALATAN) 0.005 % ophthalmic solution Place 1 drop into both eyes at bedtime. 07/14/18   [provider]  Multiple Vitamins-Minerals (PRESERVISION AREDS PO) Take 1 tablet by mouth 2 (two) times daily.     [provider]  potassium chloride (KLOR-CON) 10 MEQ tablet Take 1 tablet (10 mEq total) by mouth daily. 01/20/22   Kuneff, Renee A, DO  Probiotic Product (PROBIOTIC PO) Take 1 tablet by mouth daily.    [provider]  tamsulosin (FLOMAX) 0.4 MG CAPS capsule Take 0.4 mg by mouth in the morning and at bedtime.    [provider]  timolol (TIMOPTIC) 0.5 % ophthalmic solution Place 1 drop into both eyes 2 (two) times daily.  10/06/18   [provider]  UNABLE TO FIND Take 2 tablets by mouth every morning. Med Name:  Mito Q (takes place of Co Q 10)    [provider]      Allergies    Lisinopril, Penicillins, and Vancomycin    Review of Systems   Review of Systems  Respiratory:  Positive for shortness of breath.     Physical Exam Updated Vital Signs BP (!) 153/56   Pulse 64   Temp 98.3 F (36.8 C) (Oral)   Resp 18   Ht '5\' 4"'$  (1.626 m)   Wt 65.3 kg   SpO2 91%   BMI 24.72 kg/m  Physical Exam Vitals and nursing note reviewed. Exam conducted with a chaperone present.  Constitutional:      Appearance: Normal appearance.  HENT:     Head: Normocephalic and atraumatic.  Eyes:     General: No scleral icterus.       Right eye: No discharge.        Left eye: No discharge.     Extraocular  Movements: Extraocular movements intact.     Pupils: Pupils are equal, round, and reactive to light.  Cardiovascular:     Rate and Rhythm: Normal rate and regular rhythm.     Pulses: Normal pulses.     Heart sounds: Normal heart sounds.     No friction rub. No gallop.     Comments: Upper and lower extremity pulses symmetric bilaterally Pulmonary:     Effort: Pulmonary effort is normal. Tachypnea present. No respiratory distress.     Breath sounds: Normal breath sounds. No rales.  Abdominal:     General: Abdomen is flat. Bowel sounds are normal. There is no distension.     Palpations: Abdomen is soft.     Tenderness: There is no abdominal tenderness.  Musculoskeletal:     Right lower leg: Edema present.     Left lower leg: Edema present.  Skin:    General: Skin is warm and dry.     Coloration: Skin is not jaundiced.  Neurological:     Mental Status: He is alert. Mental status is at baseline.     Coordination: Coordination normal.     ED Results / Procedures / Treatments   Labs (all labs ordered are listed, but only abnormal results are displayed) Labs Reviewed  COMPREHENSIVE METABOLIC PANEL - Abnormal; Notable for the following components:      Result Value   Sodium 134 (*)    Glucose, Bld 228 (*)    BUN 41 (*)    Creatinine, Ser 1.97 (*)    Calcium 8.6 (*)    Albumin 3.1 (*)    GFR, Estimated 31 (*)    All other components within normal limits  CBC - Abnormal; Notable for the following components:   RBC 3.79 (*)    Hemoglobin 10.8 (*)    HCT 33.8 (*)    RDW 16.9 (*)    All other components within normal limits  BRAIN NATRIURETIC PEPTIDE - Abnormal; Notable for the following components:   B Natriuretic Peptide 1,574.9 (*)    All other components within normal limits  RESP PANEL BY RT-PCR (RSV, FLU A&B, COVID)  [id number]  TROPONIN I (HIGH SENSITIVITY)    EKG EKG Interpretation  Date/Time:  Monday May 12 2022 15:10:17 EST Ventricular Rate:  60 PR Interval:     QRS Duration: 192 QT Interval:  513 QTC Calculation: 513 R Axis:   264 Text Interpretation: VENTRICULAR PACED RHYTHM Confirmed by Garnette Gunner [phone]) on 05/12/2022 5:36:04 PM  Radiology DG Chest 2 View  Result Date: 05/12/2022 CLINICAL DATA:  URI with shortness of breath, cold and congestion since last week. History of bladder cancer, stage III chronic renal failure, atrial fibrillation, diabetes mellitus, hypertension EXAM: CHEST - 2 VIEW COMPARISON:  09/16/2021 FINDINGS: LEFT subclavian sequential transvenous pacemaker leads project at RIGHT atrium and RIGHT ventricle. Borderline enlargement of cardiac silhouette post CABG. Atherosclerotic calcification aorta. Moderate LEFT pleural effusion with atelectasis versus consolidation LEFT lower lobe. Chronic minimal RIGHT pleural effusion and basilar atelectasis. Question mild pulmonary edema. No pneumothorax or acute osseous findings. IMPRESSION: Enlargement of cardiac silhouette with vascular congestion and question minimal edema. Significant increase in LEFT pleural effusion and basilar atelectasis. Aortic Atherosclerosis (ICD10-I70.0). Electronically Signed   By: Lavonia Dana M.D.   On: 05/12/2022 15:26    Procedures Procedures    Medications Ordered in ED Medications  furosemide (LASIX) injection 40 mg (40 mg Intravenous Given 05/12/22 1741)    ED Course/ Medical Decision Making/ A&P                             Medical Decision Making Amount and/or Complexity of Data Reviewed Radiology: ordered.  Risk Prescription drug management. Decision regarding hospitalization.   Patient presents to the emergency department due to shortness of breath.  Differential includes but not limited to CHF exacerbation, viral URI, pneumonia, atypical ACS, PE, pneumothorax. I reviewed external medical records, patient's last echocardiogram in May 2023 with an EF of 40 to 45%.  Patient's son is at bedside providing independent history.  Patient  presentation I am most concerned about a CHF exacerbation.  He is borderline hypoxic with an oxygen to 91%.  Also has lower extremity edema and known history of CHF.  His lungs are clear to auscultation without rales though he is mildly tachypneic on my exam.  I ordered EKG which shows paced rhythm.  Patient has a pacemaker.  I ordered chest x-ray which shows no infiltrate but there is some mild pulmonary edema concerning for CHF exacerbation.  I ordered, viewed and interpreted laboratory workup. CBC without leukocytosis.  Anemic with a hemoglobin of 10.8.  Baseline per chart review. CMP with creatinine of 1.97.  Baseline per chart review, no gross electrolyte derangement although slightly hyperglycemic at 228. BNP elevated at 1574.9 COVID and flu negative  There is no signs of pneumonia, I considered ACS but given no ischemic changes and he is not really having any chest pain I think that is less likely especially in the overall contacts.  Not tachycardic, no pleuritic chest pain, doubt PE.  Elevated BNP and pulmonary edema on chest x-ray concerning for CHF exacerbation.  Additionally given patient is borderline hypoxic on oxygen at 91% and very symptomatic I do think he needs admission for diuresis.  I have ordered 40 IV Lasix and I will consult hospitalist service for admission.          Final Clinical Impression(s) / ED Diagnoses Final diagnoses:  Acute on chronic congestive heart failure, unspecified heart failure type [facility])    Rx / [place] Orders ED Discharge Orders     None         Sherrill Raring, PA-C 05/12/22 1804    Cristie Hem, MD 05/12/22 [phone]

## 2022-05-12 NOTE — Progress Notes (Addendum)
Call received from [facility] ED for admission. 24 yom with hx of systolic CHF (EF 80-63% in 08/2021) came in with cough, congestion, sob, orthopnea ON Lasix '40mg'$  daily CXR pulm edema, no pneumonia RVP negative O2 sat low 90s on rest. Desats on ambulation. Dx: CHF exacerbation Lasix 40 mg IV given. Accepted to cardiac tele for OBV.

## 2022-05-13 ENCOUNTER — Observation Stay (HOSPITAL_COMMUNITY): Payer: Medicare Other

## 2022-05-13 DIAGNOSIS — E785 Hyperlipidemia, unspecified: Secondary | ICD-10-CM | POA: Diagnosis present

## 2022-05-13 DIAGNOSIS — I5023 Acute on chronic systolic (congestive) heart failure: Secondary | ICD-10-CM | POA: Diagnosis present

## 2022-05-13 DIAGNOSIS — I509 Heart failure, unspecified: Secondary | ICD-10-CM

## 2022-05-13 DIAGNOSIS — D631 Anemia in chronic kidney disease: Secondary | ICD-10-CM | POA: Diagnosis present

## 2022-05-13 DIAGNOSIS — I480 Paroxysmal atrial fibrillation: Secondary | ICD-10-CM | POA: Diagnosis present

## 2022-05-13 DIAGNOSIS — Z823 Family history of stroke: Secondary | ICD-10-CM | POA: Diagnosis not present

## 2022-05-13 DIAGNOSIS — I442 Atrioventricular block, complete: Secondary | ICD-10-CM | POA: Diagnosis present

## 2022-05-13 DIAGNOSIS — Z66 Do not resuscitate: Secondary | ICD-10-CM | POA: Diagnosis present

## 2022-05-13 DIAGNOSIS — I2510 Atherosclerotic heart disease of native coronary artery without angina pectoris: Secondary | ICD-10-CM | POA: Diagnosis present

## 2022-05-13 DIAGNOSIS — Z955 Presence of coronary angioplasty implant and graft: Secondary | ICD-10-CM | POA: Diagnosis not present

## 2022-05-13 DIAGNOSIS — R0902 Hypoxemia: Secondary | ICD-10-CM | POA: Diagnosis present

## 2022-05-13 DIAGNOSIS — E1122 Type 2 diabetes mellitus with diabetic chronic kidney disease: Secondary | ICD-10-CM | POA: Diagnosis present

## 2022-05-13 DIAGNOSIS — I130 Hypertensive heart and chronic kidney disease with heart failure and stage 1 through stage 4 chronic kidney disease, or unspecified chronic kidney disease: Secondary | ICD-10-CM | POA: Diagnosis present

## 2022-05-13 DIAGNOSIS — E1151 Type 2 diabetes mellitus with diabetic peripheral angiopathy without gangrene: Secondary | ICD-10-CM | POA: Diagnosis present

## 2022-05-13 DIAGNOSIS — E876 Hypokalemia: Secondary | ICD-10-CM | POA: Diagnosis not present

## 2022-05-13 DIAGNOSIS — R8290 Unspecified abnormal findings in urine: Secondary | ICD-10-CM | POA: Diagnosis present

## 2022-05-13 DIAGNOSIS — Z8551 Personal history of malignant neoplasm of bladder: Secondary | ICD-10-CM | POA: Diagnosis not present

## 2022-05-13 DIAGNOSIS — J90 Pleural effusion, not elsewhere classified: Secondary | ICD-10-CM | POA: Diagnosis not present

## 2022-05-13 DIAGNOSIS — Z79899 Other long term (current) drug therapy: Secondary | ICD-10-CM | POA: Diagnosis not present

## 2022-05-13 DIAGNOSIS — Z1152 Encounter for screening for COVID-19: Secondary | ICD-10-CM | POA: Diagnosis not present

## 2022-05-13 DIAGNOSIS — Z8249 Family history of ischemic heart disease and other diseases of the circulatory system: Secondary | ICD-10-CM | POA: Diagnosis not present

## 2022-05-13 DIAGNOSIS — Z7901 Long term (current) use of anticoagulants: Secondary | ICD-10-CM | POA: Diagnosis not present

## 2022-05-13 DIAGNOSIS — E1165 Type 2 diabetes mellitus with hyperglycemia: Secondary | ICD-10-CM | POA: Diagnosis present

## 2022-05-13 DIAGNOSIS — Z794 Long term (current) use of insulin: Secondary | ICD-10-CM | POA: Diagnosis not present

## 2022-05-13 DIAGNOSIS — Z951 Presence of aortocoronary bypass graft: Secondary | ICD-10-CM | POA: Diagnosis not present

## 2022-05-13 DIAGNOSIS — N1832 Chronic kidney disease, stage 3b: Secondary | ICD-10-CM | POA: Diagnosis present

## 2022-05-13 LAB — GLUCOSE, CAPILLARY: Glucose-Capillary: 126 mg/dL — ABNORMAL HIGH (ref 70–99)

## 2022-05-13 MED ORDER — ACETAMINOPHEN 325 MG PO TABS: 650.0000 mg | ORAL_TABLET | ORAL | Status: DC | PRN

## 2022-05-13 MED ORDER — SODIUM CHLORIDE 0.9% FLUSH: 3.0000 mL | INTRAVENOUS | Status: DC | PRN

## 2022-05-13 MED ORDER — PROBIOTIC PO TBEC: DELAYED_RELEASE_TABLET | Freq: Every day | ORAL | Status: DC

## 2022-05-13 MED ORDER — APIXABAN 2.5 MG PO TABS
2.5000 mg | ORAL_TABLET | Freq: Two times a day (BID) | ORAL | Status: DC
  Administered 2022-05-14 – 2022-05-17 (×7): 2.5 mg via ORAL
  Filled 2022-05-13 (×6): qty 1

## 2022-05-13 MED ORDER — ATORVASTATIN CALCIUM 10 MG PO TABS
20.0000 mg | ORAL_TABLET | Freq: Every day | ORAL | Status: DC
  Administered 2022-05-13 – 2022-05-16 (×4): 20 mg via ORAL
  Filled 2022-05-13 (×4): qty 2

## 2022-05-13 MED ORDER — LATANOPROST 0.005 % OP SOLN
1.0000 [drp] | Freq: Every day | OPHTHALMIC | Status: DC
  Administered 2022-05-13 – 2022-05-16 (×4): 1 [drp] via OPHTHALMIC
  Filled 2022-05-13: qty 2.5

## 2022-05-13 MED ORDER — SODIUM CHLORIDE 0.9 % IV SOLN: 250.0000 mL | INTRAVENOUS | Status: DC | PRN

## 2022-05-13 MED ORDER — FUROSEMIDE 10 MG/ML IJ SOLN
40.0000 mg | Freq: Two times a day (BID) | INTRAMUSCULAR | Status: DC
  Administered 2022-05-13: 40 mg via INTRAVENOUS
  Filled 2022-05-13: qty 4

## 2022-05-13 MED ORDER — TIMOLOL MALEATE 0.5 % OP SOLN
1.0000 [drp] | Freq: Two times a day (BID) | OPHTHALMIC | Status: DC
  Administered 2022-05-13 – 2022-05-17 (×8): 1 [drp] via OPHTHALMIC
  Filled 2022-05-13: qty 5

## 2022-05-13 MED ORDER — DICYCLOMINE HCL 10 MG PO CAPS
10.0000 mg | ORAL_CAPSULE | Freq: Four times a day (QID) | ORAL | Status: DC | PRN
  Administered 2022-05-14 – 2022-05-16 (×3): 10 mg via ORAL
  Filled 2022-05-13 (×4): qty 1

## 2022-05-13 MED ORDER — SODIUM CHLORIDE 0.9% FLUSH
3.0000 mL | Freq: Two times a day (BID) | INTRAVENOUS | Status: DC
  Administered 2022-05-13 – 2022-05-16 (×8): 3 mL via INTRAVENOUS

## 2022-05-13 MED ORDER — ALLOPURINOL 300 MG PO TABS
150.0000 mg | ORAL_TABLET | Freq: Every day | ORAL | Status: DC
  Administered 2022-05-13 – 2022-05-17 (×5): 150 mg via ORAL
  Filled 2022-05-13 (×5): qty 1

## 2022-05-13 MED ORDER — FLUTICASONE PROPIONATE 50 MCG/ACT NA SUSP
2.0000 | Freq: Every day | NASAL | Status: DC
  Administered 2022-05-13 – 2022-05-17 (×5): 2 via NASAL
  Filled 2022-05-13: qty 16

## 2022-05-13 MED ORDER — TAMSULOSIN HCL 0.4 MG PO CAPS
0.4000 mg | ORAL_CAPSULE | Freq: Every day | ORAL | Status: DC
  Administered 2022-05-13 – 2022-05-16 (×4): 0.4 mg via ORAL
  Filled 2022-05-13 (×4): qty 1

## 2022-05-13 MED ORDER — CARVEDILOL 6.25 MG PO TABS
6.2500 mg | ORAL_TABLET | Freq: Two times a day (BID) | ORAL | Status: DC
  Administered 2022-05-13 – 2022-05-17 (×8): 6.25 mg via ORAL
  Filled 2022-05-13 (×8): qty 1

## 2022-05-13 MED ORDER — FINASTERIDE 5 MG PO TABS
5.0000 mg | ORAL_TABLET | Freq: Every day | ORAL | Status: DC
  Administered 2022-05-13 – 2022-05-17 (×5): 5 mg via ORAL
  Filled 2022-05-13 (×5): qty 1

## 2022-05-13 MED ORDER — APIXABAN 2.5 MG PO TABS
2.5000 mg | ORAL_TABLET | Freq: Two times a day (BID) | ORAL | Status: DC
  Administered 2022-05-13: 2.5 mg via ORAL
  Filled 2022-05-13 (×2): qty 1

## 2022-05-13 MED ORDER — INSULIN ASPART 100 UNIT/ML IJ SOLN
0.0000 [IU] | Freq: Three times a day (TID) | INTRAMUSCULAR | Status: DC
  Administered 2022-05-14 – 2022-05-15 (×2): 2 [IU] via SUBCUTANEOUS
  Administered 2022-05-16: 3 [IU] via SUBCUTANEOUS

## 2022-05-13 MED ORDER — ONDANSETRON HCL 4 MG/2ML IJ SOLN: 4.0000 mg | Freq: Four times a day (QID) | INTRAMUSCULAR | Status: DC | PRN

## 2022-05-13 MED ORDER — HYDRALAZINE HCL 10 MG PO TABS
10.0000 mg | ORAL_TABLET | Freq: Four times a day (QID) | ORAL | Status: DC | PRN
  Administered 2022-05-13: 10 mg via ORAL
  Filled 2022-05-13: qty 1

## 2022-05-13 NOTE — Progress Notes (Signed)
Hydralazine given for bp 173/69.order for over sbp 150 Improved on recheck   05/13/22 2110 05/13/22 2236 05/13/22 2238  Assess: MEWS Score  BP (!) 173/69 (!) 156/57 (!) 160/62  MAP (mmHg) 99 88 92  Pulse Rate  --   --  62  ECG Heart Rate 60  --   --   SpO2 96 % 95 % 95 %

## 2022-05-13 NOTE — ED Notes (Signed)
Pt and family gave verbal consent for transfer to [facility]. Which ever bed comes open first. Topaz signature pad not working.

## 2022-05-13 NOTE — ED Notes (Signed)
ED TO INPATIENT HANDOFF REPORT  ED Nurse Name and Phone #:  William Aries [place]ique, RN [phone]  S Name/Age/Gender [name] 89 y.o. male Room/Bed: MH02/MH02  Code Status   Code Status: Prior  Home/SNF/Other Home Patient oriented to: self, place, time, and situation Is this baseline? Yes   Triage Complete: Triage complete  Chief Complaint Acute exacerbation of CHF (congestive heart failure) ([place]) [I50.9]  Triage Note Pt has had URI with cold and congestion since last week.  Pt reports that sob has gooten "a little bit worse" today.  No CP with this.  Pt has had some coughing with this, no fever but pt has had fatigue.    Allergies Allergies  Allergen Reactions   Lisinopril Other (See Comments)     he has difficulty controlling potassium on ace/arb- lisinopril was dc'd in the past 2/2 to hyperkalemia documented by [facility] PCP 01/2021   Penicillins Hives    Has patient had a PCN reaction causing immediate rash, facial/tongue/throat swelling, SOB or lightheadedness with hypotension: No Has patient had a PCN reaction causing severe rash involving mucus membranes or skin necrosis: Yes Has patient had a PCN reaction that required hospitalization: No Has patient had a PCN reaction occurring within the last 10 years: No If all of the above answers are "NO", then may proceed with Cephalosporin use.    Vancomycin Other (See Comments)    Edema and myalgia    Level of Care/Admitting Diagnosis ED Disposition     ED Disposition  Admit   Condition  --   Comment  Hospital Area: [facility] [[id number]]  Level of Care: Telemetry Cardiac [[id number]]  Interfacility transfer: Yes  May place patient in observation at [facility] or [facility] if equivalent level of care is available:: Yes  Covid Evaluation: Asymptomatic - no recent exposure (last 10 days) testing not required  Diagnosis: Acute exacerbation of CHF (congestive heart failure) [facility]) [[id number]]  Admitting  Physician: DEFAULT, PROVIDER [1]  Attending Physician: DEFAULT, PROVIDER [1]          B Medical/Surgery History Past Medical History:  Diagnosis Date   A-fib ([place])    Basal cell carcinoma    skin   Bigeminal rhythm    Bradycardia 06/21/2017   Cancer of anterior wall of urinary bladder ([place]) 07/18/2019   Cataract    Chronic renal insufficiency, stage 3 (moderate) ([place]) 2018   GFR 30s-40s   Coronary artery disease    post bypass   CVD (cardiovascular disease)    Diabetes mellitus without complication ([place])    type 2 diet controlled   Diverticular disease    Easy bruising    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   Gout    Hematuria 06/30/2019   Hernia    Hyperkalemia 05/06/2017   Hyperlipidemia    Hypertension    IBS (irritable bowel syndrome)    Iron deficiency anemia 02/04/2016   Macular degeneration    Microscopic colitis    PVD (peripheral vascular disease) ([place])    Past Surgical History:  Procedure Laterality Date   CARDIAC CATHETERIZATION  2006   CARDIOVERSION N/A 02/22/2013   Procedure: CARDIOVERSION;  Surgeon: [name], MD;  Location: [facility];  Service: Cardiovascular;  Laterality: N/A;   CARDIOVERSION N/A 11/15/2014   Procedure: CARDIOVERSION;  Surgeon: [name], MD;  Location: [facility];  Service: Cardiovascular;  Laterality: N/A;   CARDIOVERSION N/A 04/01/2017   Procedure: CARDIOVERSION;  Surgeon: Larey Dresser, MD;  Location[facility]  ENDOSCOPY;  Service: Cardiovascular;  Laterality: N/A;   CAROTID ENDARTERECTOMY  2009/ 1993   left/ right   COLONOSCOPY  03/17/2005   The colon is normal.   CORONARY ARTERY BYPASS GRAFT  1999   CYSTOSCOPY W/ RETROGRADES Bilateral 07/14/2019   Procedure: CYSTOSCOPY WITH RETROGRADE PYELOGRAM;  Surgeon: Franchot Gallo, MD;  Location: [facility];  Service: Urology;  Laterality: Bilateral;   ESOPHAGOGASTRODUODENOSCOPY  03/17/2005   Normal esophagus. Normal Stomanch. Normal duodenum.    EYE  SURGERY     eyelid repair   INGUINAL HERNIA REPAIR  02/11/2012   Procedure: LAPAROSCOPIC BILATERAL INGUINAL HERNIA REPAIR;  Surgeon: Pedro Earls, MD;  Location: [facility];  Service: General;  Laterality: Bilateral;   PACEMAKER IMPLANT N/A 07/28/2017   [employer]urity MRI conditional  dual-chamber pacemaker for symptomatic second degree AV block by Dr Rayann Heman   SKIN CANCER EXCISION     right ear x 3   TRANSURETHRAL RESECTION OF BLADDER TUMOR N/A 09/05/2019   Procedure: TRANSURETHRAL RESECTION OF BLADDER TUMOR (TURBT);  Surgeon: Franchot Gallo, MD;  Location: [facility];  Service: Urology;  Laterality: N/A;   TRANSURETHRAL RESECTION OF BLADDER TUMOR WITH MITOMYCIN-C N/A 07/14/2019   Procedure: TRANSURETHRAL RESECTION OF BLADDER TUMOR WITH GEMCITABINE IN PACU;  Surgeon: Franchot Gallo, MD;  Location: [facility];  Service: Urology;  Laterality: N/A;     A IV Location/Drains/Wounds Patient Lines/Drains/Airways Status     Active Line/Drains/Airways     Name Placement date Placement time Site Days   Peripheral IV 05/12/22 20 G Right Antecubital 05/12/22  1740  Antecubital  1            Intake/Output Last 24 hours  Intake/Output Summary (Last 24 hours) at 05/13/2022 1119 Last data filed at 05/13/2022 6606 Gross per 24 hour  Intake --  Output 1250 ml  Net -1250 ml    Labs/Imaging Results for orders placed or performed during the hospital encounter of 05/12/22 (from the past 48 hour(s))  Resp panel by RT-PCR (RSV, Flu A&B, Covid) Anterior Nasal Swab     Status: None   Collection Time: 05/12/22  3:09 PM   Specimen: Anterior Nasal Swab  Result Value Ref Range   SARS Coronavirus 2 by RT PCR NEGATIVE NEGATIVE    Comment: (NOTE) SARS-CoV-2 target nucleic acids are NOT DETECTED.  The SARS-CoV-2 RNA is generally detectable in upper respiratory specimens during the acute phase of infection. The lowest concentration of SARS-CoV-2 viral  copies this assay can detect is 138 copies/mL. A negative result does not preclude SARS-Cov-2 infection and should not be used as the sole basis for treatment or other patient management decisions. A negative result may occur with  improper specimen collection/handling, submission of specimen other than nasopharyngeal swab, presence of viral mutation(s) within the areas targeted by this assay, and inadequate number of viral copies(<138 copies/mL). A negative result must be combined with clinical observations, patient history, and epidemiological information. The expected result is Negative.  Fact Sheet for Patients:  EntrepreneurPulse.com.au  Fact Sheet for Healthcare Providers:  IncredibleEmployment.be  This test is no t yet approved or cleared by the [place] FDA and  has been authorized for detection and/or diagnosis of SARS-CoV-2 by FDA under an Emergency Use Authorization (EUA). This EUA will remain  in effect (meaning this test can be used) for the duration of the COVID-19 declaration under Section 564(b)(1) of the Act, 21 U.S.C.section 360bbb-3(b)(1), unless the authorization is terminated  or revoked sooner.       Influenza A by PCR NEGATIVE NEGATIVE   Influenza B by PCR NEGATIVE NEGATIVE    Comment: (NOTE) The Xpert Xpress SARS-CoV-2/FLU/RSV plus assay is intended as an aid in the diagnosis of influenza from Nasopharyngeal swab specimens and should not be used as a sole basis for treatment. Nasal washings and aspirates are unacceptable for Xpert Xpress SARS-CoV-2/FLU/RSV testing.  Fact Sheet for Patients: EntrepreneurPulse.com.au  Fact Sheet for Healthcare Providers: IncredibleEmployment.be  This test is not yet approved or cleared by the [place] FDA and has been authorized for detection and/or diagnosis of SARS-CoV-2 by FDA under an Emergency Use Authorization (EUA). This EUA will  remain in effect (meaning this test can be used) for the duration of the COVID-19 declaration under Section 564(b)(1) of the Act, 21 U.S.C. section 360bbb-3(b)(1), unless the authorization is terminated or revoked.     Resp Syncytial Virus by PCR NEGATIVE NEGATIVE    Comment: (NOTE) Fact Sheet for Patients: EntrepreneurPulse.com.au  Fact Sheet for Healthcare Providers: IncredibleEmployment.be  This test is not yet approved or cleared by the [place] FDA and has been authorized for detection and/or diagnosis of SARS-CoV-2 by FDA under an Emergency Use Authorization (EUA). This EUA will remain in effect (meaning this test can be used) for the duration of the COVID-19 declaration under Section 564(b)(1) of the Act, 21 U.S.C. section 360bbb-3(b)(1), unless the authorization is terminated or revoked.  Performed at [facility], [address] [place], [place] [address]   Comprehensive metabolic panel     Status: Abnormal   Collection Time: 05/12/22  3:18 PM  Result Value Ref Range   Sodium 134 (L) 135 - 145 mmol/L   Potassium 4.0 3.5 - 5.1 mmol/L   Chloride 103 98 - 111 mmol/L   CO2 22 22 - 32 mmol/L   Glucose, Bld 228 (H) 70 - 99 mg/dL    Comment: Glucose reference range applies only to samples taken after fasting for at least 8 hours.   BUN 41 (H) 8 - 23 mg/dL   Creatinine, Ser 1.97 (H) 0.61 - 1.24 mg/dL   Calcium 8.6 (L) 8.9 - 10.3 mg/dL   Total Protein 7.0 6.5 - 8.1 g/dL   Albumin 3.1 (L) 3.5 - 5.0 g/dL   AST 28 15 - 41 U/L   ALT 18 0 - 44 U/L   Alkaline Phosphatase 90 38 - 126 U/L   Total Bilirubin 0.8 0.3 - 1.2 mg/dL   GFR, Estimated 31 (L) >60 mL/min    Comment: (NOTE) Calculated using the CKD-EPI Creatinine Equation (2021)    Anion gap 9 5 - 15    Comment: Performed at [facility], [address] [place], [place] [address]  CBC     Status: Abnormal   Collection Time: 05/12/22  3:18 PM  Result Value  Ref Range   WBC 5.5 4.0 - 10.5 K/uL   RBC 3.79 (L) 4.22 - 5.81 MIL/uL   Hemoglobin 10.8 (L) 13.0 - 17.0 g/dL   HCT 33.8 (L) 39.0 - 52.0 %   MCV 89.2 80.0 - 100.0 fL   MCH 28.5 26.0 - 34.0 pg   MCHC 32.0 30.0 - 36.0 g/dL   RDW 16.9 (H) 11.5 - 15.5 %   Platelets 166 150 - 400 K/uL   nRBC 0.0 0.0 - 0.2 %    Comment: Performed at [facility], [address], [place], [place] [address]  Brain natriuretic peptide     Status: Abnormal   Collection Time: 05/12/22  3:18 PM  Result Value Ref Range   B Natriuretic Peptide 1,574.9 (H) 0.0 - 100.0 pg/mL    Comment: Performed at [facility], [address] [place], [place] [address]  Troponin I (High Sensitivity)     Status: Abnormal   Collection Time: 05/12/22  6:01 PM  Result Value Ref Range   Troponin I (High Sensitivity) 34 (H) <18 ng/L    Comment: (NOTE) Elevated high sensitivity troponin I (hsTnI) values and significant  changes across serial measurements may suggest ACS but many other  chronic and acute conditions are known to elevate hsTnI results.  Refer to the "Links" section for chest pain algorithms and additional  guidance. Performed at [facility], [address] [place], [place] [address]   Troponin I (High Sensitivity)     Status: Abnormal   Collection Time: 05/12/22  7:47 PM  Result Value Ref Range   Troponin I (High Sensitivity) 35 (H) <18 ng/L    Comment: (NOTE) Elevated high sensitivity troponin I (hsTnI) values and significant  changes across serial measurements may suggest ACS but many other  chronic and acute conditions are known to elevate hsTnI results.  Refer to the "Links" section for chest pain algorithms and additional  guidance. Performed at [facility], [address] [place], [place] [address]    DG Chest 2 View  Result Date: 05/12/2022 CLINICAL DATA:  URI with shortness of breath, cold and congestion since last week. History of bladder cancer, stage  III chronic renal failure, atrial fibrillation, diabetes mellitus, hypertension EXAM: CHEST - 2 VIEW COMPARISON:  09/16/2021 FINDINGS: LEFT subclavian sequential transvenous pacemaker leads project at RIGHT atrium and RIGHT ventricle. Borderline enlargement of cardiac silhouette post CABG. Atherosclerotic calcification aorta. Moderate LEFT pleural effusion with atelectasis versus consolidation LEFT lower lobe. Chronic minimal RIGHT pleural effusion and basilar atelectasis. Question mild pulmonary edema. No pneumothorax or acute osseous findings. IMPRESSION: Enlargement of cardiac silhouette with vascular congestion and question minimal edema. Significant increase in LEFT pleural effusion and basilar atelectasis. Aortic Atherosclerosis (ICD10-I70.0). Electronically Signed   By: Lavonia Dana M.D.   On: 05/12/2022 15:26    Pending Labs Unresulted Labs (From admission, onward)    None       Vitals/Pain Today's Vitals   05/13/22 0734 05/13/22 0800 05/13/22 0830 05/13/22 1004  BP:  (!) 153/59 (!) 152/62   Pulse:  63 63   Resp:  18 17   Temp: 98.7 F (37.1 C)     TempSrc:      SpO2:  97% 96%   Weight:      Height:      PainSc:    0-No pain    Isolation Precautions No active isolations  Medications Medications  furosemide (LASIX) injection 40 mg (40 mg Intravenous Given 05/12/22 1741)    Mobility walks with person assist     Focused Assessments Cardiac Assessment Handoff: Pt is on the Cardiac monitor. Cardiac monitor shows NSR. PT is on Oxygen at 2L/min    Lab Results  Component Value Date   CKTOTAL 42 05/25/2018   CKMB 2.3 06/16/2011   TROPONINI <0.03 06/21/2017   No results found for: "DDIMER" Does the Patient currently have chest pain? No     Recommendations: See Admitting Provider Note  Report given to: IP staff  Additional Notes:

## 2022-05-13 NOTE — H&P (Signed)
History and Physical    [name]:[id number] DOB: January 17, 1929 DOA: 05/12/2022  PCP: [name], DO (Confirm with patient/family/NH records and if not entered, this has to be entered at [facility] point of entry) Patient coming from: Home  I have personally briefly reviewed patient's old medical records in [place]  Chief Complaint: Shortness of breath, leg swelling  HPI: [name] is a 89 y.o. male with medical history significant of chronic HFrEF with LVEF 40-45%, HTN, PAF on Eliquis, CAD status post CABG and stenting, third-degree AV block status post PPM, IIDM, CKD stage IIIb, chronic normocytic anemia secondary to CKD, presented with increasing shortness of breath and peripheral edema.  Symptoms started about 2 weeks ago and gradually getting worse patient noticed increasing exertional dyspnea and bilateral legs swelling.  Has been weighing him almost every day and found his weight has been remains the same as baseline however his breathing symptoms and leg swelling progressively getting worse.  Last week he started to have productive cough with light yellowish sputum but no fever or chills.  No chest pains, no recently medication changes.  For the last few days he has increased his Lasix dosage from 40 mg daily to 40 mg twice daily with no significant improvement of his symptoms.  ED Course: SBP 160s, no significant hypoxia.  X-ray showed bilateral pleural effusion left>right,  Patient was started on IV diuresis 40 mg x 1 in the ED  Review of Systems: As per HPI otherwise 14 point review of systems negative.    Past Medical History:  Diagnosis Date   A-fib [facility])    Basal cell carcinoma    skin   Bigeminal rhythm    Bradycardia 06/21/2017   Cancer of anterior wall of urinary bladder ([place]) 07/18/2019   Cataract    Chronic renal insufficiency, stage 3 (moderate) ([place]) 2018   GFR 30s-40s   Coronary artery disease    post bypass   CVD (cardiovascular disease)     Diabetes mellitus without complication ([place])    type 2 diet controlled   Diverticular disease    Easy bruising    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   Gout    Hematuria 06/30/2019   Hernia    Hyperkalemia 05/06/2017   Hyperlipidemia    Hypertension    IBS (irritable bowel syndrome)    Iron deficiency anemia 02/04/2016   Macular degeneration    Microscopic colitis    PVD (peripheral vascular disease) ([place])     Past Surgical History:  Procedure Laterality Date   CARDIAC CATHETERIZATION  2006   CARDIOVERSION N/A 02/22/2013   Procedure: CARDIOVERSION;  Surgeon: [name], MD;  Location: [place];  Service: Cardiovascular;  Laterality: N/A;   CARDIOVERSION N/A 11/15/2014   Procedure: CARDIOVERSION;  Surgeon: [name], MD;  Location: [place];  Service: Cardiovascular;  Laterality: N/A;   CARDIOVERSION N/A 04/01/2017   Procedure: CARDIOVERSION;  Surgeon: Larey Dresser, MD;  Location: [place];  Service: Cardiovascular;  Laterality: N/A;   CAROTID ENDARTERECTOMY  2009/ 1993   left/ right   COLONOSCOPY  03/17/2005   The colon is normal.   CORONARY ARTERY BYPASS GRAFT  1999   CYSTOSCOPY W/ RETROGRADES Bilateral 07/14/2019   Procedure: CYSTOSCOPY WITH RETROGRADE PYELOGRAM;  Surgeon: Franchot Gallo, MD;  Location[facility];  Service: Urology;  Laterality: Bilateral;   ESOPHAGOGASTRODUODENOSCOPY  03/17/2005   Normal esophagus. Normal Stomanch. Normal duodenum.    EYE SURGERY  eyelid repair   INGUINAL HERNIA REPAIR  02/11/2012   Procedure: LAPAROSCOPIC BILATERAL INGUINAL HERNIA REPAIR;  Surgeon: Pedro Earls, MD;  Location: [facility];  Service: General;  Laterality: Bilateral;   PACEMAKER IMPLANT N/A 07/28/2017   [employer]urity MRI conditional  dual-chamber pacemaker for symptomatic second degree AV block by Dr Rayann Heman   SKIN CANCER EXCISION     right ear x 3   TRANSURETHRAL RESECTION OF BLADDER TUMOR N/A 09/05/2019    Procedure: TRANSURETHRAL RESECTION OF BLADDER TUMOR (TURBT);  Surgeon: Franchot Gallo, MD;  Location[facility];  Service: Urology;  Laterality: N/A;   TRANSURETHRAL RESECTION OF BLADDER TUMOR WITH MITOMYCIN-C N/A 07/14/2019   Procedure: TRANSURETHRAL RESECTION OF BLADDER TUMOR WITH GEMCITABINE IN PACU;  Surgeon: Franchot Gallo, MD;  Location: [facility];  Service: Urology;  Laterality: N/A;     reports that he has quit smoking. He quit smokeless tobacco use about 53 years ago.  His smokeless tobacco use included chew. He reports that he does not drink alcohol and does not use drugs.  Allergies  Allergen Reactions   Lisinopril Other (See Comments)     he has difficulty controlling potassium on ace/arb- lisinopril was dc'd in the past 2/2 to hyperkalemia documented by [facility] PCP 01/2021   Penicillins Hives    Has patient had a PCN reaction causing immediate rash, facial/tongue/throat swelling, SOB or lightheadedness with hypotension: No Has patient had a PCN reaction causing severe rash involving mucus membranes or skin necrosis: Yes Has patient had a PCN reaction that required hospitalization: No Has patient had a PCN reaction occurring within the last 10 years: No If all of the above answers are "NO", then may proceed with Cephalosporin use.    Vancomycin Other (See Comments)    Edema and myalgia    Family History  Problem Relation Age of Onset   Stroke Mother    Coronary artery disease Father    Heart disease Father    Melanoma Sister    Colon cancer Neg Hx      Prior to Admission medications   Medication Sig Start Date End Date Taking? Authorizing Provider  allopurinol (ZYLOPRIM) 300 MG tablet Take 0.5 tablets (150 mg total) by mouth daily. 01/20/22  Yes Kuneff, Renee A, DO  amLODipine (NORVASC) 5 MG tablet Take 1 tablet (5 mg total) by mouth at bedtime. 01/20/22  Yes Kuneff, Renee A, DO  atorvastatin (LIPITOR) 20 MG tablet Take 1 tablet  (20 mg total) by mouth at bedtime. 01/20/22  Yes Kuneff, Renee A, DO  carvedilol (COREG) 6.25 MG tablet Take 1 tablet (6.25 mg total) by mouth 2 (two) times daily with a meal. 01/20/22  Yes Kuneff, Renee A, DO  Cholecalciferol 125 MCG (5000 UT) capsule Take 5,000 Units by mouth daily.   Yes [provider]  ELIQUIS 2.5 MG TABS tablet TAKE 1 TABLET TWICE A DAY 12/06/21  Yes [name], MD  finasteride (PROSCAR) 5 MG tablet Take 5 mg by mouth daily. 08/24/17  Yes [provider]  fish oil-omega-3 fatty acids 1000 MG capsule Take 1 g by mouth daily.    Yes [provider]  fluticasone (FLONASE) 50 MCG/ACT nasal spray Place 2 sprays into both nostrils daily. 02/13/22  Yes [provider]  furosemide (LASIX) 40 MG tablet Take 1 tablet (40 mg total) by mouth daily. 01/20/22  Yes Kuneff, Renee A, DO  latanoprost (XALATAN) 0.005 % ophthalmic solution Place 1 drop into  both eyes at bedtime. 07/14/18  Yes [provider]  Multiple Vitamins-Minerals (PRESERVISION AREDS PO) Take 1 tablet by mouth 2 (two) times daily.    Yes [provider]  potassium chloride (KLOR-CON) 10 MEQ tablet Take 1 tablet (10 mEq total) by mouth daily. 01/20/22  Yes Kuneff, Renee A, DO  Probiotic Product (PROBIOTIC PO) Take 1 tablet by mouth daily.   Yes [provider]  tamsulosin (FLOMAX) 0.4 MG CAPS capsule Take 0.4 mg by mouth in the morning and at bedtime.   Yes [provider]  timolol (TIMOPTIC) 0.5 % ophthalmic solution Place 1 drop into both eyes 2 (two) times daily.  10/06/18  Yes [provider]  dicyclomine (BENTYL) 10 MG capsule Take 10 mg by mouth every 6 (six) hours as needed. 02/02/22   [provider]  glucose blood (ONETOUCH VERIO) test strip Use as instructed 08/12/21   Raoul Pitch, Renee A, DO  Lancets (ONETOUCH ULTRASOFT) lancets Use as instructed 11/26/21   Kuneff, Renee A, DO  UNABLE TO FIND Take 2 tablets by mouth every morning. Med  Name: Ester Rink (takes place of Co Q 10)    [provider]    Physical Exam: Vitals:   05/13/22 1130 05/13/22 1145 05/13/22 1150 05/13/22 1252  BP: (!) 166/60  (!) 166/60 (!) 161/64  Pulse: 61 (!) 58 60 63  Resp: (!) '23 20 18 20  '$ Temp:   98.8 F (37.1 C) 98.8 F (37.1 C)  TempSrc:   Oral Oral  SpO2: 96% 99% 98% 96%  Weight:    67.3 kg  Height:    '5\' 7"'$  (1.702 m)    Constitutional: NAD, calm, comfortable Vitals:   05/13/22 1130 05/13/22 1145 05/13/22 1150 05/13/22 1252  BP: (!) 166/60  (!) 166/60 (!) 161/64  Pulse: 61 (!) 58 60 63  Resp: (!) '23 20 18 20  '$ Temp:   98.8 F (37.1 C) 98.8 F (37.1 C)  TempSrc:   Oral Oral  SpO2: 96% 99% 98% 96%  Weight:    67.3 kg  Height:    '5\' 7"'$  (1.702 m)   Eyes: PERRL, lids and conjunctivae normal ENMT: Mucous membranes are moist. Posterior pharynx clear of any exudate or lesions.Normal dentition.  Neck: normal, supple, no masses, no thyromegaly Respiratory: Diminished breathing sound on left lung field, no wheezing, bilateral fine crackles. Increasing respiratory effort. No accessory muscle use.  Cardiovascular: Regular rate and rhythm, no murmurs / rubs / gallops. 2+ extremity edema. 2+ pedal pulses. No carotid bruits.  Abdomen: no tenderness, no masses palpated. No hepatosplenomegaly. Bowel sounds positive.  Musculoskeletal: no clubbing / cyanosis. No joint deformity upper and lower extremities. Good ROM, no contractures. Normal muscle tone.  Skin: no rashes, lesions, ulcers. No induration Neurologic: CN 2-12 grossly intact. Sensation intact, DTR normal. Strength 5/5 in all 4.  Psychiatric: Normal judgment and insight. Alert and oriented x 3. Normal mood.     Labs on Admission: I have personally reviewed following labs and imaging studies  CBC: Recent Labs  Lab 05/12/22 1518  WBC 5.5  HGB 10.8*  HCT 33.8*  MCV 89.2  PLT 967   Basic Metabolic Panel: Recent Labs  Lab 05/12/22 1518  NA 134*  K 4.0  CL 103  CO2 22   GLUCOSE 228*  BUN 41*  CREATININE 1.97*  CALCIUM 8.6*   GFR: Estimated Creatinine Clearance: 22.8 mL/min (A) (by C-G formula based on SCr of 1.97 mg/dL (H)). Liver Function Tests: Recent Labs  Lab 05/12/22 1518  AST 28  ALT 18  ALKPHOS 90  BILITOT 0.8  PROT 7.0  ALBUMIN 3.1*   No results for input(s): "LIPASE", "AMYLASE" in the last 168 hours. No results for input(s): "AMMONIA" in the last 168 hours. Coagulation Profile: No results for input(s): "INR", "PROTIME" in the last 168 hours. Cardiac Enzymes: No results for input(s): "CKTOTAL", "CKMB", "CKMBINDEX", "TROPONINI" in the last 168 hours. BNP (last 3 results) Recent Labs    08/20/21 1020 08/23/21 1041  PROBNP 1,616.0* 1,629.0*   HbA1C: No results for input(s): "HGBA1C" in the last 72 hours. CBG: No results for input(s): "GLUCAP" in the last 168 hours. Lipid Profile: No results for input(s): "CHOL", "HDL", "LDLCALC", "TRIG", "CHOLHDL", "LDLDIRECT" in the last 72 hours. Thyroid Function Tests: No results for input(s): "TSH", "T4TOTAL", "FREET4", "T3FREE", "THYROIDAB" in the last 72 hours. Anemia Panel: No results for input(s): "VITAMINB12", "FOLATE", "FERRITIN", "TIBC", "IRON", "RETICCTPCT" in the last 72 hours. Urine analysis:    Component Value Date/Time   COLORURINE ORANGE (A) 08/16/2017 0936   APPEARANCEUR HAZY (A) 08/16/2017 0936   LABSPEC 1.010 08/16/2017 0936   PHURINE 5.5 08/16/2017 0936   GLUCOSEU NEGATIVE 08/16/2017 0936   HGBUR TRACE (A) 08/16/2017 0936   BILIRUBINUR NEGATIVE 08/16/2017 0936   KETONESUR NEGATIVE 08/16/2017 0936   PROTEINUR 100 (A) 08/16/2017 0936   UROBILINOGEN 0.2 10/21/2008 2146   NITRITE POSITIVE (A) 08/16/2017 0936   LEUKOCYTESUR SMALL (A) 08/16/2017 0936    Radiological Exams on Admission: DG Chest 1 View  Result Date: 05/13/2022 CLINICAL DATA:  Congestive heart failure EXAM: CHEST  1 VIEW COMPARISON:  Yesterday FINDINGS: Pacer with leads at right atrium and right  ventricle. No lead discontinuity. Midline trachea. Moderate cardiomegaly. Median sternotomy for CABG. Moderate left-sided pleural effusion with minimal lateral loculation, similar. Small right pleural effusion, increased. No pneumothorax. Skin folds over the inferolateral right hemithorax. Interstitial edema is moderate, slightly increased. Slight worsening in bibasilar airspace disease, greater left than right. IMPRESSION: Slightly worsened aeration with increased congestive failure, left-greater-than-right pleural effusions, and bibasilar airspace disease (most likely atelectasis). Electronically Signed   By: Abigail Miyamoto M.D.   On: 05/13/2022 14:35   DG Chest 2 View  Result Date: 05/12/2022 CLINICAL DATA:  URI with shortness of breath, cold and congestion since last week. History of bladder cancer, stage III chronic renal failure, atrial fibrillation, diabetes mellitus, hypertension EXAM: CHEST - 2 VIEW COMPARISON:  09/16/2021 FINDINGS: LEFT subclavian sequential transvenous pacemaker leads project at RIGHT atrium and RIGHT ventricle. Borderline enlargement of cardiac silhouette post CABG. Atherosclerotic calcification aorta. Moderate LEFT pleural effusion with atelectasis versus consolidation LEFT lower lobe. Chronic minimal RIGHT pleural effusion and basilar atelectasis. Question mild pulmonary edema. No pneumothorax or acute osseous findings. IMPRESSION: Enlargement of cardiac silhouette with vascular congestion and question minimal edema. Significant increase in LEFT pleural effusion and basilar atelectasis. Aortic Atherosclerosis (ICD10-I70.0). Electronically Signed   By: Lavonia Dana M.D.   On: 05/12/2022 15:26    EKG: Independently reviewed. Paced  Assessment/Plan Principal Problem:   Acute exacerbation of CHF (congestive heart failure) (HCC) Active Problems:   Essential hypertension   A-fib (HCC)   Anemia   CHF (congestive heart failure) ([place])  (please populate well all problems here in  Problem List. (For example, if patient is on BP meds at home and you resume or decide to hold them, it is a problem that needs to be her. Same for CAD, COPD, HLD and so on)  Acute on chronic  HFrEF decompensation -Clinically still fluid overloaded, increase Lasix to 40 mg IV twice daily, monitor daily renal function -Echocardiogram -Blood pressure significantly elevated, discontinue amlodipine due to history of CHF, continue Coreg 6.25 mg twice daily, start as needed hydralazine, and consider hydralazine plus Imdur regimen depends on his blood pressure response. -Daily weight, I&O's  CKD stage IIIb -Fluid overload with mild increase of baseline creatinine level, probably cardiorenal syndrome -Diuresis as above  IIDM with hyperglycemia -Sliding scale for now  HTN, uncontrolled -AS ABOVE  Hx of third-degree AV block -No acute issue, EEG shows paced rhythm  PAF -Continue renal dosed Eliquis  Chronic normocytic anemia secondary to CKD -Outpatient EPO injection  CAD with troponin elevation -Troponin trending 34>35, no chest pain -Likely secondary to CKD and CHF decompensation, ACS ruled out.  DVT prophylaxis: Eliquis Code Status: DNR Family Communication: Son at bedside Disposition Plan: Patient is sick with significant CHF decompensation failed outpatient management, requiring inpatient IV diuresis, with a baseline CKD, condition is complicated, expect more than 2 midnight hospital stay. Consults called: None Admission status: Tele admit   Lequita Halt MD [facility] Pager [phone]  05/13/2022, 3:00 PM

## 2022-05-14 DIAGNOSIS — I5023 Acute on chronic systolic (congestive) heart failure: Secondary | ICD-10-CM

## 2022-05-14 LAB — BASIC METABOLIC PANEL
Anion gap: 12 (ref 5–15)
BUN: 40 mg/dL — ABNORMAL HIGH (ref 8–23)
CO2: 21 mmol/L — ABNORMAL LOW (ref 22–32)
Calcium: 8.3 mg/dL — ABNORMAL LOW (ref 8.9–10.3)
Chloride: 104 mmol/L (ref 98–111)
Creatinine, Ser: 1.87 mg/dL — ABNORMAL HIGH (ref 0.61–1.24)
GFR, Estimated: 34 mL/min — ABNORMAL LOW (ref >60)
Glucose, Bld: 111 mg/dL — ABNORMAL HIGH (ref 70–99)
Potassium: 3.6 mmol/L (ref 3.5–5.1)
Sodium: 137 mmol/L (ref 135–145)

## 2022-05-14 LAB — GLUCOSE, CAPILLARY
Glucose-Capillary: 102 mg/dL — ABNORMAL HIGH (ref 70–99)
Glucose-Capillary: 173 mg/dL — ABNORMAL HIGH (ref 70–99)
Glucose-Capillary: 97 mg/dL (ref 70–99)
Glucose-Capillary: 179 mg/dL — ABNORMAL HIGH (ref 70–99)

## 2022-05-14 MED ORDER — POTASSIUM CHLORIDE CRYS ER 20 MEQ PO TBCR
40.0000 meq | EXTENDED_RELEASE_TABLET | Freq: Once | ORAL | Status: AC
  Administered 2022-05-14: 40 meq via ORAL
  Filled 2022-05-14: qty 2

## 2022-05-14 MED ORDER — FUROSEMIDE 10 MG/ML IJ SOLN
60.0000 mg | Freq: Two times a day (BID) | INTRAMUSCULAR | Status: DC
  Administered 2022-05-14 (×2): 60 mg via INTRAVENOUS
  Filled 2022-05-14 (×2): qty 6

## 2022-05-14 NOTE — Progress Notes (Signed)
PROGRESS NOTE    NYMIR RINGLER  MBW:[id number] DOB: 04-19-1929 DOA: 05/12/2022 PCP: Howard Pouch A, DO  89/M with history of chronic systolic CHF, EF 01-77%, CAD/CABG, third-degree AV block with PPM, paroxysmal A-fib on Eliquis, CKD 3b,, anemia of chronic disease presented to the ED with worsening shortness of breath and peripheral edema.  Increased symptoms over 2 weeks, compliant with meds, has been eating out more recently on account of wife's illness In the ER SBP 160s, other vital stable, chest x-ray with bilateral pleural effusion, creatinine stable at 1.9  Subjective: -Starting to feel better, breathing improving, not back to baseline  Assessment and Plan:  Acute on chronic HFrEF decompensation -Last echo with EF 40-45%, per cards clinic notes ischemic evaluation not pursued in the past on account of advanced age and CKD  -Compliant with meds, suspect exacerbation could be secondary to dietary indiscretion, eating out more in the last 1 month  -Continue IV Lasix today, increase dose to 60 Mg twice daily, follow-up repeat echo  -Continue Coreg -Continue limited by CKD, monitor I's/O, BMP in a.m. -PT eval   CKD stage IIIb -creatinine improved compared to recent baseline of 2, now 1.8 -Diuresis as above   IIDM with hyperglycemia -CBGs are stable, continue sliding scale insulin   Hx of third-degree AV block with PPM -No acute issue, EEG shows paced rhythm   PAF -Paced rhythm, heart rate controlled, continue Eliquis    Chronic normocytic anemia secondary to CKD -Outpatient EPO injection   CAD/CABG -Troponin trending 34>35, no chest pain -Likely secondary to CKD and CHF decompensation, ACS ruled out.   DVT prophylaxis: Eliquis Code Status: DNR Family Communication: None present Disposition Plan: Home likely 2 to 3 days  Consultants:    Procedures:   Antimicrobials:    Objective: Vitals:   05/13/22 2236 05/13/22 2238 05/14/22 0026 05/14/22 0459  BP: (!)  156/57 (!) 160/62 (!) 158/56 (!) 154/60  Pulse:  62  62  Resp:  18    Temp:   98 F (36.7 C) 97.9 F (36.6 C)  TempSrc:    Oral  SpO2: 95% 95% 93% 93%  Weight:    64.3 kg  Height:        Intake/Output Summary (Last 24 hours) at 05/14/2022 0731 Last data filed at 05/14/2022 0500 Gross per 24 hour  Intake 100 ml  Output 950 ml  Net -850 ml   Filed Weights   05/12/22 1507 05/13/22 1252 05/14/22 0459  Weight: 65.3 kg 67.3 kg 64.3 kg    Examination:  General exam: Appears calm and comfortable  Respiratory system: Clear to auscultation Cardiovascular system: S1 & S2 heard, RRR.  Abd: nondistended, soft and nontender.Normal bowel sounds heard. Central nervous system: Alert and oriented. No focal neurological deficits. Extremities: no edema Skin: No rashes Psychiatry:  Mood & affect appropriate.     Data Reviewed:   CBC: Recent Labs  Lab 05/12/22 1518  WBC 5.5  HGB 10.8*  HCT 33.8*  MCV 89.2  PLT 939   Basic Metabolic Panel: Recent Labs  Lab 05/12/22 1518 05/14/22 0253  NA 134* 137  K 4.0 3.6  CL 103 104  CO2 22 21*  GLUCOSE 228* 111*  BUN 41* 40*  CREATININE 1.97* 1.87*  CALCIUM 8.6* 8.3*   GFR: Estimated Creatinine Clearance: 23.4 mL/min (A) (by C-G formula based on SCr of 1.87 mg/dL (H)). Liver Function Tests: Recent Labs  Lab 05/12/22 1518  AST 28  ALT 18  ALKPHOS 90  BILITOT 0.8  PROT 7.0  ALBUMIN 3.1*   No results for input(s): "LIPASE", "AMYLASE" in the last 168 hours. No results for input(s): "AMMONIA" in the last 168 hours. Coagulation Profile: No results for input(s): "INR", "PROTIME" in the last 168 hours. Cardiac Enzymes: No results for input(s): "CKTOTAL", "CKMB", "CKMBINDEX", "TROPONINI" in the last 168 hours. BNP (last 3 results) Recent Labs    08/20/21 1020 08/23/21 1041  PROBNP 1,616.0* 1,629.0*   HbA1C: No results for input(s): "HGBA1C" in the last 72 hours. CBG: Recent Labs  Lab 05/13/22 2125  GLUCAP 126*    Lipid Profile: No results for input(s): "CHOL", "HDL", "LDLCALC", "TRIG", "CHOLHDL", "LDLDIRECT" in the last 72 hours. Thyroid Function Tests: No results for input(s): "TSH", "T4TOTAL", "FREET4", "T3FREE", "THYROIDAB" in the last 72 hours. Anemia Panel: No results for input(s): "VITAMINB12", "FOLATE", "FERRITIN", "TIBC", "IRON", "RETICCTPCT" in the last 72 hours. Urine analysis:    Component Value Date/Time   COLORURINE ORANGE (A) 08/16/2017 0936   APPEARANCEUR HAZY (A) 08/16/2017 0936   LABSPEC 1.010 08/16/2017 0936   PHURINE 5.5 08/16/2017 0936   GLUCOSEU NEGATIVE 08/16/2017 0936   HGBUR TRACE (A) 08/16/2017 0936   BILIRUBINUR NEGATIVE 08/16/2017 0936   KETONESUR NEGATIVE 08/16/2017 0936   PROTEINUR 100 (A) 08/16/2017 0936   UROBILINOGEN 0.2 10/21/2008 2146   NITRITE POSITIVE (A) 08/16/2017 0936   LEUKOCYTESUR SMALL (A) 08/16/2017 0936   Sepsis Labs: '@LABRCNTIP'$ (procalcitonin:4,lacticidven:4)  ) Recent Results (from the past 240 hour(s))  Resp panel by RT-PCR (RSV, Flu A&B, Covid) Anterior Nasal Swab     Status: None   Collection Time: 05/12/22  3:09 PM   Specimen: Anterior Nasal Swab  Result Value Ref Range Status   SARS Coronavirus 2 by RT PCR NEGATIVE NEGATIVE Final    Comment: (NOTE) SARS-CoV-2 target nucleic acids are NOT DETECTED.  The SARS-CoV-2 RNA is generally detectable in upper respiratory specimens during the acute phase of infection. The lowest concentration of SARS-CoV-2 viral copies this assay can detect is 138 copies/mL. A negative result does not preclude SARS-Cov-2 infection and should not be used as the sole basis for treatment or other patient management decisions. A negative result may occur with  improper specimen collection/handling, submission of specimen other than nasopharyngeal swab, presence of viral mutation(s) within the areas targeted by this assay, and inadequate number of viral copies(<138 copies/mL). A negative result must be combined  with clinical observations, patient history, and epidemiological information. The expected result is Negative.  Fact Sheet for Patients:  EntrepreneurPulse.com.au  Fact Sheet for Healthcare Providers:  IncredibleEmployment.be  This test is no t yet approved or cleared by the [place] FDA and  has been authorized for detection and/or diagnosis of SARS-CoV-2 by FDA under an Emergency Use Authorization (EUA). This EUA will remain  in effect (meaning this test can be used) for the duration of the COVID-19 declaration under Section 564(b)(1) of the Act, 21 U.S.C.section 360bbb-3(b)(1), unless the authorization is terminated  or revoked sooner.       Influenza A by PCR NEGATIVE NEGATIVE Final   Influenza B by PCR NEGATIVE NEGATIVE Final    Comment: (NOTE) The Xpert Xpress SARS-CoV-2/FLU/RSV plus assay is intended as an aid in the diagnosis of influenza from Nasopharyngeal swab specimens and should not be used as a sole basis for treatment. Nasal washings and aspirates are unacceptable for Xpert Xpress SARS-CoV-2/FLU/RSV testing.  Fact Sheet for Patients: EntrepreneurPulse.com.au  Fact Sheet for Healthcare Providers: IncredibleEmployment.be  This test is not yet approved  or cleared by the [place] and has been authorized for detection and/or diagnosis of SARS-CoV-2 by FDA under an Emergency Use Authorization (EUA). This EUA will remain in effect (meaning this test can be used) for the duration of the COVID-19 declaration under Section 564(b)(1) of the Act, 21 U.S.C. section 360bbb-3(b)(1), unless the authorization is terminated or revoked.     Resp Syncytial Virus by PCR NEGATIVE NEGATIVE Final    Comment: (NOTE) Fact Sheet for Patients: EntrepreneurPulse.com.au  Fact Sheet for Healthcare Providers: IncredibleEmployment.be  This test is not yet approved  or cleared by the [place] FDA and has been authorized for detection and/or diagnosis of SARS-CoV-2 by FDA under an Emergency Use Authorization (EUA). This EUA will remain in effect (meaning this test can be used) for the duration of the COVID-19 declaration under Section 564(b)(1) of the Act, 21 U.S.C. section 360bbb-3(b)(1), unless the authorization is terminated or revoked.  Performed at [facility], [address] [place], [place] [address]      Radiology Studies: DG Chest 1 View  Result Date: 05/13/2022 CLINICAL DATA:  Congestive heart failure EXAM: CHEST  1 VIEW COMPARISON:  Yesterday FINDINGS: Pacer with leads at right atrium and right ventricle. No lead discontinuity. Midline trachea. Moderate cardiomegaly. Median sternotomy for CABG. Moderate left-sided pleural effusion with minimal lateral loculation, similar. Small right pleural effusion, increased. No pneumothorax. Skin folds over the inferolateral right hemithorax. Interstitial edema is moderate, slightly increased. Slight worsening in bibasilar airspace disease, greater left than right. IMPRESSION: Slightly worsened aeration with increased congestive failure, left-greater-than-right pleural effusions, and bibasilar airspace disease (most likely atelectasis). Electronically Signed   By: Abigail Miyamoto M.D.   On: 05/13/2022 14:35   DG Chest 2 View  Result Date: 05/12/2022 CLINICAL DATA:  URI with shortness of breath, cold and congestion since last week. History of bladder cancer, stage III chronic renal failure, atrial fibrillation, diabetes mellitus, hypertension EXAM: CHEST - 2 VIEW COMPARISON:  09/16/2021 FINDINGS: LEFT subclavian sequential transvenous pacemaker leads project at RIGHT atrium and RIGHT ventricle. Borderline enlargement of cardiac silhouette post CABG. Atherosclerotic calcification aorta. Moderate LEFT pleural effusion with atelectasis versus consolidation LEFT lower lobe. Chronic minimal RIGHT  pleural effusion and basilar atelectasis. Question mild pulmonary edema. No pneumothorax or acute osseous findings. IMPRESSION: Enlargement of cardiac silhouette with vascular congestion and question minimal edema. Significant increase in LEFT pleural effusion and basilar atelectasis. Aortic Atherosclerosis (ICD10-I70.0). Electronically Signed   By: Lavonia Dana M.D.   On: 05/12/2022 15:26     Scheduled Meds:  allopurinol  150 mg Oral Daily   apixaban  2.5 mg Oral BID   atorvastatin  20 mg Oral QHS   carvedilol  6.25 mg Oral BID WC   finasteride  5 mg Oral Daily   fluticasone  2 spray Each Nare Daily   furosemide  40 mg Intravenous BID   insulin aspart  0-9 Units Subcutaneous TID WC   latanoprost  1 drop Both Eyes QHS   sodium chloride flush  3 mL Intravenous Q12H   tamsulosin  0.4 mg Oral QPC supper   timolol  1 drop Both Eyes BID   Continuous Infusions:  sodium chloride       LOS: 1 day    Time spent: 53mn    PDomenic Polite MD [facility]   05/14/2022, 7:31 AM

## 2022-05-14 NOTE — Evaluation (Signed)
Physical Therapy Evaluation & Discharge Patient Details Name: [name] MRN: [id number] DOB: 1928-10-17 Today's Date: 05/14/2022  History of Present Illness  Pt is a 89 y.o. male admitted 05/12/22 with progressive SOB, peripheral edema. Workup for fluid overload, CHF exacerbation. PMH includes HFrEF (LVEF 40-45%), HTN, PAF on Eliquis, CAD s/p CABG, PPM, DM2, CKD 3, gout, anemia.   Clinical Impression  Patient evaluated by Physical Therapy with no further acute PT needs identified. PTA, pt independent without DME, lives with wife, does not drive. Today, pt moving well without assist, pt notes improvement in SOB and BLE swelling. SpO2 down to 89% with ambulation, quick increase to >/94% on RA with seated rest. All education has been completed and the patient has no further questions. Acute PT is signing off. Thank you for this referral.   Recommendations for follow up therapy are one component of a multi-disciplinary discharge planning process, led by the attending physician.  Recommendations may be updated based on patient status, additional functional criteria and insurance authorization.  Follow Up Recommendations No PT follow up      Assistance Recommended at Discharge PRN  Patient can return home with the following  Assist for transportation;Assistance with cooking/housework    Equipment Recommendations None recommended by PT  Recommendations for Other Services       Functional Status Assessment Patient has not had a recent decline in their functional status     Precautions / Restrictions Precautions Precautions: Other (comment) Precaution Comments: watch SpO2 (does not wear baseline) Restrictions Weight Bearing Restrictions: No      Mobility  Bed Mobility Overal bed mobility: Independent                  Transfers Overall transfer level: Independent Equipment used: None                    Ambulation/Gait Ambulation/Gait assistance:  Supervision Gait Distance (Feet): 320 Feet Assistive device: None Gait Pattern/deviations: Step-through pattern, Decreased stride length Gait velocity: Decreased     General Gait Details: slow, mostly steady gait without DME, 1x self-corrected LOB, stability improving with distance; pt self-monitoring of activity tolerance, declines further distance secondary to fatigue. minimal DOE noted  Financial trader Rankin (Stroke Patients Only)       Balance Overall balance assessment: Needs assistance Sitting-balance support: No upper extremity supported, Feet supported Sitting balance-Leahy Scale: Good     Standing balance support: No upper extremity supported, During functional activity Standing balance-Leahy Scale: Good               High level balance activites: Side stepping, Direction changes, Turns, Sudden stops, Head turns               Pertinent Vitals/Pain Pain Assessment Pain Assessment: No/denies pain    Home Living Family/patient expects to be discharged to:: Private residence Living Arrangements: Spouse/significant other Available Help at Discharge: Family;Available 24 hours/day Type of Home: House Home Access: Stairs to enter Entrance Stairs-Rails: Right Entrance Stairs-Number of Steps: 2   Home Layout: One level Home Equipment: Conservation officer, nature (2 wheels);Cane - single point;Shower seat;Grab bars - tub/shower;Transport chair;Other (comment) (lift chair) Additional Comments: lives with wife who ambulates with RW, she is currently working with HHPT and pt does exercises with her    Prior Function Prior Level of Function : Independent/Modified Independent  Mobility Comments: independent without DME, enjoys staying active. does not drive (wife drives) ADLs Comments: independent     Hand Dominance        Extremity/Trunk Assessment   Upper Extremity Assessment Upper Extremity Assessment:  Overall WFL for tasks assessed    Lower Extremity Assessment Lower Extremity Assessment: Overall WFL for tasks assessed (noted bilateral lower leg edema; pt reports RLE stays swollen since vein graft for CABG)       Communication   Communication: HOH  Cognition Arousal/Alertness: Awake/alert Behavior During Therapy: WFL for tasks assessed/performed Overall Cognitive Status: Within Functional Limits for tasks assessed                                          General Comments General comments (skin integrity, edema, etc.): SpO2 down to 89% on RA, up to 94% on RA with seated rest    Exercises     Assessment/Plan    PT Assessment Patient does not need any further PT services  PT Problem List         PT Treatment Interventions      PT Goals (Current goals can be found in the Care Plan section)  Acute Rehab PT Goals PT Goal Formulation: All assessment and education complete, DC therapy    Frequency       Co-evaluation               AM-PAC PT "6 Clicks" Mobility  Outcome Measure Help needed turning from your back to your side while in a flat bed without using bedrails?: None Help needed moving from lying on your back to sitting on the side of a flat bed without using bedrails?: None Help needed moving to and from a bed to a chair (including a wheelchair)?: None Help needed standing up from a chair using your arms (e.g., wheelchair or bedside chair)?: None Help needed to walk in hospital room?: A Little Help needed climbing 3-5 steps with a railing? : A Little 6 Click Score: 22    End of Session Equipment Utilized During Treatment: Gait belt Activity Tolerance: Patient tolerated treatment well Patient left: in chair;with call bell/phone within reach Nurse Communication: Mobility status PT Visit Diagnosis: Other abnormalities of gait and mobility (R26.89)    Time: 0630-1601 PT Time Calculation (min) (ACUTE ONLY): 17 min   Charges:   PT  Evaluation $PT Eval Low Complexity: 1 Low         Mabeline Caras, PT, DPT Acute Rehabilitation Services  Personal: [facility] Office: [phone]  Derry Lory 05/14/2022, 9:01 AM

## 2022-05-14 NOTE — Progress Notes (Signed)
SATURATION QUALIFICATIONS: (This note is used to comply with regulatory documentation for home oxygen)  Patient Saturations on Room Air at Rest = 94%  Patient Saturations on Room Air while Ambulating = 89%  Patient Saturations on -- Liters of oxygen while Ambulating = N/A    Mabeline Caras, PT, DPT Acute Rehabilitation Services  Personal: [place] Office: [phone]

## 2022-05-14 NOTE — Plan of Care (Signed)
Pt alert and oriented x 4. HOH with hearing aides. Pt received hydralazine for sbp 170's at hs. Gets up 1 assist. Last bm 05/12/22. +2 edema to BLE. Feet elevated during overnight on pillow until pt removed. Foam on left elbow over healed abrasion/tear.  Problem: Education: Goal: Knowledge of General Education information will improve Description: Including pain rating scale, medication(s)/side effects and non-pharmacologic comfort measures Outcome: Progressing   Problem: Health Behavior/Discharge Planning: Goal: Ability to manage health-related needs will improve Outcome: Progressing   Problem: Clinical Measurements: Goal: Ability to maintain clinical measurements within normal limits will improve Outcome: Progressing Goal: Will remain free from infection Outcome: Progressing Goal: Diagnostic test results will improve Outcome: Progressing Goal: Respiratory complications will improve Outcome: Progressing Goal: Cardiovascular complication will be avoided Outcome: Progressing   Problem: Activity: Goal: Risk for activity intolerance will decrease Outcome: Progressing   Problem: Nutrition: Goal: Adequate nutrition will be maintained Outcome: Progressing   Problem: Coping: Goal: Level of anxiety will decrease Outcome: Progressing   Problem: Elimination: Goal: Will not experience complications related to bowel motility Outcome: Progressing Goal: Will not experience complications related to urinary retention Outcome: Progressing   Problem: Pain Managment: Goal: General experience of comfort will improve Outcome: Progressing   Problem: Safety: Goal: Ability to remain free from injury will improve Outcome: Progressing   Problem: Skin Integrity: Goal: Risk for impaired skin integrity will decrease Outcome: Progressing   Problem: Education: Goal: Ability to describe self-care measures that may prevent or decrease complications (Diabetes Survival Skills Education) will  improve Outcome: Progressing Goal: Individualized Educational Video(s) Outcome: Progressing   Problem: Coping: Goal: Ability to adjust to condition or change in health will improve Outcome: Progressing   Problem: Fluid Volume: Goal: Ability to maintain a balanced intake and output will improve Outcome: Progressing   Problem: Health Behavior/Discharge Planning: Goal: Ability to identify and utilize available resources and services will improve Outcome: Progressing Goal: Ability to manage health-related needs will improve Outcome: Progressing   Problem: Metabolic: Goal: Ability to maintain appropriate glucose levels will improve Outcome: Progressing   Problem: Nutritional: Goal: Maintenance of adequate nutrition will improve Outcome: Progressing Goal: Progress toward achieving an optimal weight will improve Outcome: Progressing   Problem: Skin Integrity: Goal: Risk for impaired skin integrity will decrease Outcome: Progressing   Problem: Tissue Perfusion: Goal: Adequacy of tissue perfusion will improve Outcome: Progressing

## 2022-05-14 NOTE — Progress Notes (Signed)
Heart Failure Nurse Navigator Progress Note   Patient declined HF TOC , would like to stay with Dr. Johnsie Cancel, whom he has seen for years. Very pleasant man, educated on the sign and symptoms of heart failure, daily weights (he does already) when to call his doctor or go to the ED, diet/ fluid restrictions, patient reported that he has been eating out more lately and not cooking, due to his wife's illness. Reports taking all his medication as prescribed and attending his doctors appointments. Patient verbalized he understood the continued HF education.   Navigator will sign off at this time.   Earnestine Leys, BSN, Clinical cytogeneticist Only

## 2022-05-15 DIAGNOSIS — I5023 Acute on chronic systolic (congestive) heart failure: Secondary | ICD-10-CM | POA: Diagnosis not present

## 2022-05-15 LAB — COMPREHENSIVE METABOLIC PANEL
ALT: 24 U/L (ref 0–44)
AST: 34 U/L (ref 15–41)
Albumin: 2.8 g/dL — ABNORMAL LOW (ref 3.5–5.0)
Alkaline Phosphatase: 90 U/L (ref 38–126)
Anion gap: 11 (ref 5–15)
BUN: 42 mg/dL — ABNORMAL HIGH (ref 8–23)
CO2: 23 mmol/L (ref 22–32)
Calcium: 8.8 mg/dL — ABNORMAL LOW (ref 8.9–10.3)
Chloride: 103 mmol/L (ref 98–111)
Creatinine, Ser: 1.84 mg/dL — ABNORMAL HIGH (ref 0.61–1.24)
GFR, Estimated: 34 mL/min — ABNORMAL LOW (ref >60)
Glucose, Bld: 97 mg/dL (ref 70–99)
Potassium: 3.4 mmol/L — ABNORMAL LOW (ref 3.5–5.1)
Sodium: 137 mmol/L (ref 135–145)
Total Bilirubin: 0.8 mg/dL (ref 0.3–1.2)
Total Protein: 6.8 g/dL (ref 6.5–8.1)

## 2022-05-15 LAB — CBC
HCT: 33.6 % — ABNORMAL LOW (ref 39.0–52.0)
Hemoglobin: 10.7 g/dL — ABNORMAL LOW (ref 13.0–17.0)
MCH: 27.9 pg (ref 26.0–34.0)
MCHC: 31.8 g/dL (ref 30.0–36.0)
MCV: 87.7 fL (ref 80.0–100.0)
Platelets: 187 10*3/uL (ref 150–400)
RBC: 3.83 MIL/uL — ABNORMAL LOW (ref 4.22–5.81)
RDW: 16.4 % — ABNORMAL HIGH (ref 11.5–15.5)
WBC: 5.2 10*3/uL (ref 4.0–10.5)
nRBC: 0 % (ref 0.0–0.2)

## 2022-05-15 LAB — GLUCOSE, CAPILLARY
Glucose-Capillary: 178 mg/dL — ABNORMAL HIGH (ref 70–99)
Glucose-Capillary: 160 mg/dL — ABNORMAL HIGH (ref 70–99)
Glucose-Capillary: 116 mg/dL — ABNORMAL HIGH (ref 70–99)
Glucose-Capillary: 190 mg/dL — ABNORMAL HIGH (ref 70–99)

## 2022-05-15 MED ORDER — ALBUMIN HUMAN 25 % IV SOLN
25.0000 g | Freq: Once | INTRAVENOUS | Status: AC
  Administered 2022-05-15: 25 g via INTRAVENOUS
  Filled 2022-05-15: qty 100

## 2022-05-15 MED ORDER — FUROSEMIDE 10 MG/ML IJ SOLN
80.0000 mg | Freq: Two times a day (BID) | INTRAMUSCULAR | Status: DC
  Administered 2022-05-15 (×2): 80 mg via INTRAVENOUS
  Filled 2022-05-15 (×2): qty 8

## 2022-05-15 MED ORDER — POTASSIUM CHLORIDE CRYS ER 20 MEQ PO TBCR
40.0000 meq | EXTENDED_RELEASE_TABLET | Freq: Two times a day (BID) | ORAL | Status: AC
  Administered 2022-05-15 (×2): 40 meq via ORAL
  Filled 2022-05-15 (×2): qty 2

## 2022-05-15 NOTE — Progress Notes (Addendum)
PROGRESS NOTE    [name]  SHF:[id number] DOB: [birth year] DOA: 05/12/2022 PCP: Howard Pouch A, DO  89/M with history of chronic systolic CHF, EF 50-27%, CAD/CABG, third-degree AV block with PPM, paroxysmal A-fib on Eliquis, CKD 3b,, anemia of chronic disease presented to the ED with worsening shortness of breath and peripheral edema.  Increased symptoms over 2 weeks, compliant with meds, has been eating out more recently on account of wife's illness In the ER SBP 160s, other vital stable, chest x-ray with bilateral pleural effusion, creatinine stable at 1.9  Subjective: -Feels better overall, breathing is improving  Assessment and Plan:  Acute on chronic HFrEF decompensation -Last echo with EF 40-45%, per cards clinic notes ischemic evaluation not pursued in the past on account of advanced age and CKD  -Compliant with meds, suspect exacerbation could be secondary to dietary indiscretion, eating out more in the last 1 month  -Improving with diuresis he is 2.2 L negative, continue IV Lasix 80 Mg twice daily today -Continue Coreg -Continue limited by CKD, monitor I's/O, BMP in a.m. -PT eval completed, home health PT recommended   CKD stage IIIb -creatinine improved compared to recent baseline of 2, now 1.8 -Diuresis as above   IIDM with hyperglycemia -CBGs are stable, continue sliding scale insulin   Hx of third-degree AV block with PPM -No acute issue, EEG shows paced rhythm   PAF -Paced rhythm, heart rate controlled, continue Eliquis   Abnormal urinalysis -Asymptomatic bacteriuria, no indication for antibiotics   Chronic normocytic anemia secondary to CKD -Outpatient EPO injection   CAD/CABG -Troponin trending 34>35, no chest pain -Likely secondary to CKD and CHF decompensation, ACS ruled out.  Hypokalemia -Replace   DVT prophylaxis: Eliquis Code Status: DNR Family Communication: None present Disposition Plan: Home likely 48 hours  Consultants:     Procedures:   Antimicrobials:    Objective: Vitals:   05/14/22 1832 05/14/22 1955 05/15/22 0419 05/15/22 0800  BP: (!) 161/51 (!) 151/58 (!) 147/55 (!) 156/58  Pulse: 63 63 60 66  Resp: '18 18 20 18  '$ Temp: 97.8 F (36.6 C) 97.7 F (36.5 C) 97.8 F (36.6 C) 98.4 F (36.9 C)  TempSrc: Oral Oral Oral Oral  SpO2: 94% 96% 95% 93%  Weight:   64.2 kg   Height:        Intake/Output Summary (Last 24 hours) at 05/15/2022 1113 Last data filed at 05/15/2022 0735 Gross per 24 hour  Intake --  Output 1150 ml  Net -1150 ml   Filed Weights   05/13/22 1252 05/14/22 0459 05/15/22 0419  Weight: 67.3 kg 64.3 kg 64.2 kg    Examination:  General exam: Elderly male sitting up in bed, AAOx3, no distress HEENT: Positive JVD CVS: S1-S2, regular rhythm Lungs: Few basilar Rales otherwise clear Abdomen: Soft, nontender, bowel sounds present Remedies: Trace edema Skin: No rashes Psychiatry:  Mood & affect appropriate.     Data Reviewed:   CBC: Recent Labs  Lab 05/12/22 1518 05/15/22 0230  WBC 5.5 5.2  HGB 10.8* 10.7*  HCT 33.8* 33.6*  MCV 89.2 87.7  PLT 166 741   Basic Metabolic Panel: Recent Labs  Lab 05/12/22 1518 05/14/22 0253 05/15/22 0230  NA 134* 137 137  K 4.0 3.6 3.4*  CL 103 104 103  CO2 22 21* 23  GLUCOSE 228* 111* 97  BUN 41* 40* 42*  CREATININE 1.97* 1.87* 1.84*  CALCIUM 8.6* 8.3* 8.8*   GFR: Estimated Creatinine Clearance: 23.7 mL/min (A) (by C-G  formula based on SCr of 1.84 mg/dL (H)). Liver Function Tests: Recent Labs  Lab 05/12/22 1518 05/15/22 0230  AST 28 34  ALT 18 24  ALKPHOS 90 90  BILITOT 0.8 0.8  PROT 7.0 6.8  ALBUMIN 3.1* 2.8*   No results for input(s): "LIPASE", "AMYLASE" in the last 168 hours. No results for input(s): "AMMONIA" in the last 168 hours. Coagulation Profile: No results for input(s): "INR", "PROTIME" in the last 168 hours. Cardiac Enzymes: No results for input(s): "CKTOTAL", "CKMB", "CKMBINDEX", "TROPONINI" in  the last 168 hours. BNP (last 3 results) Recent Labs    08/20/21 1020 08/23/21 1041  PROBNP 1,616.0* 1,629.0*   HbA1C: No results for input(s): "HGBA1C" in the last 72 hours. CBG: Recent Labs  Lab 05/14/22 0750 05/14/22 1221 05/14/22 1707 05/14/22 2051 05/15/22 0954  GLUCAP 102* 173* 97 179* 178*   Lipid Profile: No results for input(s): "CHOL", "HDL", "LDLCALC", "TRIG", "CHOLHDL", "LDLDIRECT" in the last 72 hours. Thyroid Function Tests: No results for input(s): "TSH", "T4TOTAL", "FREET4", "T3FREE", "THYROIDAB" in the last 72 hours. Anemia Panel: No results for input(s): "VITAMINB12", "FOLATE", "FERRITIN", "TIBC", "IRON", "RETICCTPCT" in the last 72 hours. Urine analysis:    Component Value Date/Time   COLORURINE ORANGE (A) 08/16/2017 0936   APPEARANCEUR HAZY (A) 08/16/2017 0936   LABSPEC 1.010 08/16/2017 0936   PHURINE 5.5 08/16/2017 0936   GLUCOSEU NEGATIVE 08/16/2017 0936   HGBUR TRACE (A) 08/16/2017 0936   BILIRUBINUR NEGATIVE 08/16/2017 0936   KETONESUR NEGATIVE 08/16/2017 0936   PROTEINUR 100 (A) 08/16/2017 0936   UROBILINOGEN 0.2 10/21/2008 2146   NITRITE POSITIVE (A) 08/16/2017 0936   LEUKOCYTESUR SMALL (A) 08/16/2017 0936   Sepsis Labs: '@LABRCNTIP'$ (procalcitonin:4,lacticidven:4)  ) Recent Results (from the past 240 hour(s))  Resp panel by RT-PCR (RSV, Flu A&B, Covid) Anterior Nasal Swab     Status: None   Collection Time: 05/12/22  3:09 PM   Specimen: Anterior Nasal Swab  Result Value Ref Range Status   SARS Coronavirus 2 by RT PCR NEGATIVE NEGATIVE Final    Comment: (NOTE) SARS-CoV-2 target nucleic acids are NOT DETECTED.  The SARS-CoV-2 RNA is generally detectable in upper respiratory specimens during the acute phase of infection. The lowest concentration of SARS-CoV-2 viral copies this assay can detect is 138 copies/mL. A negative result does not preclude SARS-Cov-2 infection and should not be used as the sole basis for treatment or other  patient management decisions. A negative result may occur with  improper specimen collection/handling, submission of specimen other than nasopharyngeal swab, presence of viral mutation(s) within the areas targeted by this assay, and inadequate number of viral copies(<138 copies/mL). A negative result must be combined with clinical observations, patient history, and epidemiological information. The expected result is Negative.  Fact Sheet for Patients:  EntrepreneurPulse.com.au  Fact Sheet for Healthcare Providers:  IncredibleEmployment.be  This test is no t yet approved or cleared by the [place] FDA and  has been authorized for detection and/or diagnosis of SARS-CoV-2 by FDA under an Emergency Use Authorization (EUA). This EUA will remain  in effect (meaning this test can be used) for the duration of the COVID-19 declaration under Section 564(b)(1) of the Act, 21 U.S.C.section 360bbb-3(b)(1), unless the authorization is terminated  or revoked sooner.       Influenza A by PCR NEGATIVE NEGATIVE Final   Influenza B by PCR NEGATIVE NEGATIVE Final    Comment: (NOTE) The Xpert Xpress SARS-CoV-2/FLU/RSV plus assay is intended as an aid in the diagnosis of  influenza from Nasopharyngeal swab specimens and should not be used as a sole basis for treatment. Nasal washings and aspirates are unacceptable for Xpert Xpress SARS-CoV-2/FLU/RSV testing.  Fact Sheet for Patients: EntrepreneurPulse.com.au  Fact Sheet for Healthcare Providers: IncredibleEmployment.be  This test is not yet approved or cleared by the [place] FDA and has been authorized for detection and/or diagnosis of SARS-CoV-2 by FDA under an Emergency Use Authorization (EUA). This EUA will remain in effect (meaning this test can be used) for the duration of the COVID-19 declaration under Section 564(b)(1) of the Act, 21 U.S.C. section  360bbb-3(b)(1), unless the authorization is terminated or revoked.     Resp Syncytial Virus by PCR NEGATIVE NEGATIVE Final    Comment: (NOTE) Fact Sheet for Patients: EntrepreneurPulse.com.au  Fact Sheet for Healthcare Providers: IncredibleEmployment.be  This test is not yet approved or cleared by the [place] FDA and has been authorized for detection and/or diagnosis of SARS-CoV-2 by FDA under an Emergency Use Authorization (EUA). This EUA will remain in effect (meaning this test can be used) for the duration of the COVID-19 declaration under Section 564(b)(1) of the Act, 21 U.S.C. section 360bbb-3(b)(1), unless the authorization is terminated or revoked.  Performed at [facility], [address] [place], [place] [address]      Radiology Studies: DG Chest 1 View  Result Date: 05/13/2022 CLINICAL DATA:  Congestive heart failure EXAM: CHEST  1 VIEW COMPARISON:  Yesterday FINDINGS: Pacer with leads at right atrium and right ventricle. No lead discontinuity. Midline trachea. Moderate cardiomegaly. Median sternotomy for CABG. Moderate left-sided pleural effusion with minimal lateral loculation, similar. Small right pleural effusion, increased. No pneumothorax. Skin folds over the inferolateral right hemithorax. Interstitial edema is moderate, slightly increased. Slight worsening in bibasilar airspace disease, greater left than right. IMPRESSION: Slightly worsened aeration with increased congestive failure, left-greater-than-right pleural effusions, and bibasilar airspace disease (most likely atelectasis). Electronically Signed   By: Abigail Miyamoto M.D.   On: 05/13/2022 14:35     Scheduled Meds:  allopurinol  150 mg Oral Daily   apixaban  2.5 mg Oral BID   atorvastatin  20 mg Oral QHS   carvedilol  6.25 mg Oral BID WC   finasteride  5 mg Oral Daily   fluticasone  2 spray Each Nare Daily   furosemide  80 mg Intravenous BID   insulin  aspart  0-9 Units Subcutaneous TID WC   latanoprost  1 drop Both Eyes QHS   potassium chloride  40 mEq Oral BID   sodium chloride flush  3 mL Intravenous Q12H   tamsulosin  0.4 mg Oral QPC supper   timolol  1 drop Both Eyes BID   Continuous Infusions:  sodium chloride       LOS: 2 days    Time spent: 46mn    PDomenic Polite MD [facility]   05/15/2022, 11:13 AM

## 2022-05-15 NOTE — Progress Notes (Signed)
Mobility Specialist - Progress Note   05/15/22 1122  Mobility  Activity Ambulated with assistance in hallway  Level of Assistance Standby assist, set-up cues, supervision of patient - no hands on  Assistive Device None  Distance Ambulated (ft) 470 ft  Activity Response Tolerated well  Mobility Referral Yes  $Mobility charge 1 Mobility    Pre-mobility: 94% SpO2 During mobility:84 HR, BP, 91% SpO2 Post-mobility:68 HR, 93%SpO2  Pt was received in bed and agreeable to mobility. Pt had x2 bout of LOB at beginning of session but was able to self correct. No other incidents throughout session. Pt was left in chair with all needs met and lunch present .   [name]  Mobility Specialist Please contact via Solicitor or Rehab office at [phone]

## 2022-05-16 DIAGNOSIS — I5023 Acute on chronic systolic (congestive) heart failure: Secondary | ICD-10-CM | POA: Diagnosis not present

## 2022-05-16 LAB — GLUCOSE, CAPILLARY
Glucose-Capillary: 210 mg/dL — ABNORMAL HIGH (ref 70–99)
Glucose-Capillary: 129 mg/dL — ABNORMAL HIGH (ref 70–99)
Glucose-Capillary: 113 mg/dL — ABNORMAL HIGH (ref 70–99)
Glucose-Capillary: 139 mg/dL — ABNORMAL HIGH (ref 70–99)

## 2022-05-16 LAB — BASIC METABOLIC PANEL
Anion gap: 9 (ref 5–15)
BUN: 50 mg/dL — ABNORMAL HIGH (ref 8–23)
CO2: 27 mmol/L (ref 22–32)
Calcium: 9 mg/dL (ref 8.9–10.3)
Chloride: 103 mmol/L (ref 98–111)
Creatinine, Ser: 2.06 mg/dL — ABNORMAL HIGH (ref 0.61–1.24)
GFR, Estimated: 30 mL/min — ABNORMAL LOW (ref >60)
Glucose, Bld: 97 mg/dL (ref 70–99)
Potassium: 4.1 mmol/L (ref 3.5–5.1)
Sodium: 139 mmol/L (ref 135–145)

## 2022-05-16 MED ORDER — POTASSIUM CHLORIDE CRYS ER 20 MEQ PO TBCR
20.0000 meq | EXTENDED_RELEASE_TABLET | Freq: Once | ORAL | Status: AC
  Administered 2022-05-16: 20 meq via ORAL
  Filled 2022-05-16: qty 1

## 2022-05-16 MED ORDER — FUROSEMIDE 40 MG PO TABS
40.0000 mg | ORAL_TABLET | Freq: Every day | ORAL | Status: DC
  Administered 2022-05-16 – 2022-05-17 (×2): 40 mg via ORAL
  Filled 2022-05-16 (×2): qty 1

## 2022-05-16 NOTE — Progress Notes (Signed)
PROGRESS NOTE    [name]  ZJQ:[id number] DOB: [birth year] DOA: 05/12/2022 PCP: Howard Pouch A, DO  89/M with history of chronic systolic CHF, EF 24-09%, CAD/CABG, third-degree AV block with PPM, paroxysmal A-fib on Eliquis, CKD 3b,, anemia of chronic disease presented to the ED with worsening shortness of breath and peripheral edema.  Increased symptoms over 2 weeks, compliant with meds, has been eating out more recently on account of wife's illness In the ER SBP 160s, other vital stable, chest x-ray with bilateral pleural effusion, creatinine stable at 1.9  Subjective: -Feels better overall, breathing continues to improve, diuresing well  Assessment and Plan:  Acute on chronic HFrEF decompensation -Last echo with EF 40-45%, per cards clinic notes ischemic evaluation not pursued in the past on account of advanced age and CKD  -Compliant with meds, suspect exacerbation could be secondary to dietary indiscretion, eating out more in the last 1 month  -Improving with diuresis, he is 4.5 L negative, volume status has improved considerably, changed to oral Lasix today, continue Coreg -GDMT limited by CKD -Discharge planning, home tomorrow if stable, weaned off O2 today   CKD stage IIIb -creatinine improved compared to recent baseline of 2, now 1.8 -Diuresis as above   IIDM with hyperglycemia -CBGs are stable, continue sliding scale insulin   Hx of third-degree AV block with PPM -No acute issue, EKG shows paced rhythm   PAF -Paced rhythm, heart rate controlled, continue Eliquis   Abnormal urinalysis -Asymptomatic bacteriuria, no indication for antibiotics   Chronic normocytic anemia secondary to CKD -Outpatient EPO injection   CAD/CABG -Troponin trending 34>35, no chest pain -Likely secondary to CKD and CHF decompensation, ACS ruled out.  Hypokalemia -Replace   DVT prophylaxis: Eliquis Code Status: DNR Family Communication: None present Disposition Plan: Home likely  tomorrow  Consultants:    Procedures:   Antimicrobials:    Objective: Vitals:   05/15/22 1600 05/15/22 2310 05/16/22 0404 05/16/22 0821  BP: (!) 162/68  (!) 155/59 (!) 135/49  Pulse: 60  63 60  Resp: '18 18 16 16  '$ Temp: 97.8 F (36.6 C) (!) 97.4 F (36.3 C) 98.7 F (37.1 C) (!) 97.5 F (36.4 C)  TempSrc: Oral Oral Oral Oral  SpO2:   96% 93%  Weight:   59.6 kg   Height:        Intake/Output Summary (Last 24 hours) at 05/16/2022 1143 Last data filed at 05/16/2022 0700 Gross per 24 hour  Intake 100 ml  Output 2240 ml  Net -2140 ml   Filed Weights   05/14/22 0459 05/15/22 0419 05/16/22 0404  Weight: 64.3 kg 64.2 kg 59.6 kg    Examination:  General exam: Elderly pleasant male sitting up in bed, AAOx3, no distress HEENT: No JVD CVS: S1-S2, regular rhythm Lungs: Few basilar rales otherwise clear Abdomen: Soft, nontender, bowel sounds present Extremities: No edema Skin: No rashes Psychiatry:  Mood & affect appropriate.     Data Reviewed:   CBC: Recent Labs  Lab 05/12/22 1518 05/15/22 0230  WBC 5.5 5.2  HGB 10.8* 10.7*  HCT 33.8* 33.6*  MCV 89.2 87.7  PLT 166 735   Basic Metabolic Panel: Recent Labs  Lab 05/12/22 1518 05/14/22 0253 05/15/22 0230 05/16/22 0326  NA 134* 137 137 139  K 4.0 3.6 3.4* 4.1  CL 103 104 103 103  CO2 22 21* 23 27  GLUCOSE 228* 111* 97 97  BUN 41* 40* 42* 50*  CREATININE 1.97* 1.87* 1.84* 2.06*  CALCIUM  8.6* 8.3* 8.8* 9.0   GFR: Estimated Creatinine Clearance: 19.7 mL/min (A) (by C-G formula based on SCr of 2.06 mg/dL (H)). Liver Function Tests: Recent Labs  Lab 05/12/22 1518 05/15/22 0230  AST 28 34  ALT 18 24  ALKPHOS 90 90  BILITOT 0.8 0.8  PROT 7.0 6.8  ALBUMIN 3.1* 2.8*   No results for input(s): "LIPASE", "AMYLASE" in the last 168 hours. No results for input(s): "AMMONIA" in the last 168 hours. Coagulation Profile: No results for input(s): "INR", "PROTIME" in the last 168 hours. Cardiac Enzymes: No  results for input(s): "CKTOTAL", "CKMB", "CKMBINDEX", "TROPONINI" in the last 168 hours. BNP (last 3 results) Recent Labs    08/20/21 1020 08/23/21 1041  PROBNP 1,616.0* 1,629.0*   HbA1C: No results for input(s): "HGBA1C" in the last 72 hours. CBG: Recent Labs  Lab 05/15/22 0954 05/15/22 1133 05/15/22 1641 05/15/22 2117 05/16/22 0824  GLUCAP 178* 160* 116* 190* 210*   Lipid Profile: No results for input(s): "CHOL", "HDL", "LDLCALC", "TRIG", "CHOLHDL", "LDLDIRECT" in the last 72 hours. Thyroid Function Tests: No results for input(s): "TSH", "T4TOTAL", "FREET4", "T3FREE", "THYROIDAB" in the last 72 hours. Anemia Panel: No results for input(s): "VITAMINB12", "FOLATE", "FERRITIN", "TIBC", "IRON", "RETICCTPCT" in the last 72 hours. Urine analysis:    Component Value Date/Time   COLORURINE ORANGE (A) 08/16/2017 0936   APPEARANCEUR HAZY (A) 08/16/2017 0936   LABSPEC 1.010 08/16/2017 0936   PHURINE 5.5 08/16/2017 0936   GLUCOSEU NEGATIVE 08/16/2017 0936   HGBUR TRACE (A) 08/16/2017 0936   BILIRUBINUR NEGATIVE 08/16/2017 0936   KETONESUR NEGATIVE 08/16/2017 0936   PROTEINUR 100 (A) 08/16/2017 0936   UROBILINOGEN 0.2 10/21/2008 2146   NITRITE POSITIVE (A) 08/16/2017 0936   LEUKOCYTESUR SMALL (A) 08/16/2017 0936   Sepsis Labs: '@LABRCNTIP'$ (procalcitonin:4,lacticidven:4)  ) Recent Results (from the past 240 hour(s))  Resp panel by RT-PCR (RSV, Flu A&B, Covid) Anterior Nasal Swab     Status: None   Collection Time: 05/12/22  3:09 PM   Specimen: Anterior Nasal Swab  Result Value Ref Range Status   SARS Coronavirus 2 by RT PCR NEGATIVE NEGATIVE Final    Comment: (NOTE) SARS-CoV-2 target nucleic acids are NOT DETECTED.  The SARS-CoV-2 RNA is generally detectable in upper respiratory specimens during the acute phase of infection. The lowest concentration of SARS-CoV-2 viral copies this assay can detect is 138 copies/mL. A negative result does not preclude SARS-Cov-2 infection  and should not be used as the sole basis for treatment or other patient management decisions. A negative result may occur with  improper specimen collection/handling, submission of specimen other than nasopharyngeal swab, presence of viral mutation(s) within the areas targeted by this assay, and inadequate number of viral copies(<138 copies/mL). A negative result must be combined with clinical observations, patient history, and epidemiological information. The expected result is Negative.  Fact Sheet for Patients:  EntrepreneurPulse.com.au  Fact Sheet for Healthcare Providers:  IncredibleEmployment.be  This test is no t yet approved or cleared by the [place] FDA and  has been authorized for detection and/or diagnosis of SARS-CoV-2 by FDA under an Emergency Use Authorization (EUA). This EUA will remain  in effect (meaning this test can be used) for the duration of the COVID-19 declaration under Section 564(b)(1) of the Act, 21 U.S.C.section 360bbb-3(b)(1), unless the authorization is terminated  or revoked sooner.       Influenza A by PCR NEGATIVE NEGATIVE Final   Influenza B by PCR NEGATIVE NEGATIVE Final    Comment: (NOTE)  The Xpert Xpress SARS-CoV-2/FLU/RSV plus assay is intended as an aid in the diagnosis of influenza from Nasopharyngeal swab specimens and should not be used as a sole basis for treatment. Nasal washings and aspirates are unacceptable for Xpert Xpress SARS-CoV-2/FLU/RSV testing.  Fact Sheet for Patients: EntrepreneurPulse.com.au  Fact Sheet for Healthcare Providers: IncredibleEmployment.be  This test is not yet approved or cleared by the [place] FDA and has been authorized for detection and/or diagnosis of SARS-CoV-2 by FDA under an Emergency Use Authorization (EUA). This EUA will remain in effect (meaning this test can be used) for the duration of the COVID-19 declaration  under Section 564(b)(1) of the Act, 21 U.S.C. section 360bbb-3(b)(1), unless the authorization is terminated or revoked.     Resp Syncytial Virus by PCR NEGATIVE NEGATIVE Final    Comment: (NOTE) Fact Sheet for Patients: EntrepreneurPulse.com.au  Fact Sheet for Healthcare Providers: IncredibleEmployment.be  This test is not yet approved or cleared by the [place] FDA and has been authorized for detection and/or diagnosis of SARS-CoV-2 by FDA under an Emergency Use Authorization (EUA). This EUA will remain in effect (meaning this test can be used) for the duration of the COVID-19 declaration under Section 564(b)(1) of the Act, 21 U.S.C. section 360bbb-3(b)(1), unless the authorization is terminated or revoked.  Performed at [facility], [address] [place], [place] [address]      Radiology Studies: No results found.   Scheduled Meds:  allopurinol  150 mg Oral Daily   apixaban  2.5 mg Oral BID   atorvastatin  20 mg Oral QHS   carvedilol  6.25 mg Oral BID WC   finasteride  5 mg Oral Daily   fluticasone  2 spray Each Nare Daily   furosemide  40 mg Oral Daily   insulin aspart  0-9 Units Subcutaneous TID WC   latanoprost  1 drop Both Eyes QHS   sodium chloride flush  3 mL Intravenous Q12H   tamsulosin  0.4 mg Oral QPC supper   timolol  1 drop Both Eyes BID   Continuous Infusions:  sodium chloride       LOS: 3 days    Time spent: 54mn    PDomenic Polite MD [facility]   05/16/2022, 11:43 AM

## 2022-05-16 NOTE — TOC Initial Note (Signed)
Transition of Care [facility]) - Initial/Assessment Note    Patient Details  Name: [name] MRN: [id number] Date of Birth: 1928/05/14  Transition of Care [facility]) CM/SW Contact:    Bethena Roys, RN Phone Number: 05/16/2022, 3:36 PM  Clinical Narrative: Patient presented for shortness of breath and lower extremity edema. PTA patient was from home with spouse. Patient states spouse is having back issues and is unable to drive him to appointments. Patient states he has additional support of daughters, church members, and neighbors that are willing to take him to appointments. Case Manager discussed home health services with the patient and he wants to think about [facility] RN for disease management. Weekend Staff to follow up to see if the patent will benefit from services.                  Expected Discharge Plan: Home/Self Care Barriers to Discharge: Continued Medical Work up   Patient Goals and CMS Choice Patient states their goals for this hospitalization and ongoing recovery are:: ptient wants to return home   Choice offered to / list presented to : NA      Expected Discharge Plan and Services   Discharge Planning Services: CM Consult Post Acute Care Choice: NA Living arrangements for the past 2 months: Single Family Home                   DME Agency: NA       HH Arranged: NA          Prior Living Arrangements/Services Living arrangements for the past 2 months: Single Family Home Lives with:: Spouse   Do you feel safe going back to the place where you live?: Yes      Need for Family Participation in Patient Care: Yes (Comment) Care giver support system in place?: Yes (comment)   Criminal Activity/Legal Involvement Pertinent to Current Situation/Hospitalization: No - Comment as needed  Activities of Daily Living Home Assistive Devices/Equipment: Eyeglasses, Hearing aid, Blood pressure cuff, CBG Meter, Grab bars in shower, Hand-held shower hose, Scales ADL  Screening (condition at time of admission) Patient's cognitive ability adequate to safely complete daily activities?: Yes Is the patient deaf or have difficulty hearing?: Yes Does the patient have difficulty seeing, even when wearing glasses/contacts?: Yes Does the patient have difficulty concentrating, remembering, or making decisions?: No Patient able to express need for assistance with ADLs?: Yes Does the patient have difficulty dressing or bathing?: No Independently performs ADLs?: Yes (appropriate for developmental age) Does the patient have difficulty walking or climbing stairs?: No Weakness of Legs: Both Weakness of Arms/Hands: None  Permission Sought/Granted Permission sought to share information with : Family Supports, Case Manager                Emotional Assessment Appearance:: Appears stated age Attitude/Demeanor/Rapport: Engaged Affect (typically observed): Appropriate Orientation: : Oriented to Place, Oriented to Self, Oriented to  Time Alcohol / Substance Use: Not Applicable Psych Involvement: No (comment)  Admission diagnosis:  Acute exacerbation of CHF (congestive heart failure) ([place]) [I50.9] Acute on chronic congestive heart failure, unspecified heart failure type (HCC) [I50.9] CHF (congestive heart failure) ([place]) [I50.9] Patient Active Problem List   Diagnosis Date Noted   CHF (congestive heart failure) ([place]) 05/13/2022   Acute exacerbation of CHF (congestive heart failure) ([place]) 09/16/2021   Trigger middle finger of right hand 06/13/2020   Chronic right shoulder pain 06/13/2020   Acute pain of left shoulder 06/13/2020   Acquired thrombophilia (  [place])- Eliquis use 11/26/2019   Cancer of anterior wall of urinary bladder ([place]) 07/18/2019   Irritable bowel syndrome with diarrhea 11/04/2017   [place] (type) I [facility]) atrioventricular block 06/21/2017   CHF (congestive heart failure), NYHA class I, acute on chronic, diastolic (HCC)    Coronary artery  disease of bypass graft of native heart with stable angina pectoris (HCC)    Hyperlipidemia LDL goal <70    Iron deficiency anemia 02/04/2016   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   Vitamin D deficiency 12/27/2015   Anemia 12/27/2015   Bence Jones proteinuria 12/27/2015   Chronic kidney disease (CKD), stage III (moderate) ([place]) 12/19/2015   Macular degeneration    Gout    A-fib ([place]) 01/03/2013   Essential hypertension 08/02/2008   BIGEMINY 08/02/2008   Prediabetes 08/02/2008   PCP:  [name], DO Pharmacy:   [facility], [place] [address] Phone: [phone] Fax: [phone]  [id number] [id number]: [phone]: [phone]    Social Determinants of Health (SDOH) Social History: SDOH Screenings   Food Insecurity: No Food Insecurity (05/13/2022)  Housing: Low Risk  (05/13/2022)  Transportation Needs: No Transportation Needs (05/13/2022)  Utilities: Not At Risk (05/13/2022)  Alcohol Screen: Low Risk  (05/14/2022)  Depression (PHQ2-9): Low Risk  (07/12/2021)  Physical Activity: Insufficiently Active (07/12/2021)  Social Connections: Unknown (07/12/2021)  Stress: No Stress Concern Present (07/12/2021)  Tobacco Use: Medium Risk (05/12/2022)   SDOH Interventions: Food Insecurity Interventions: Intervention Not Indicated Housing Interventions: Intervention Not Indicated Transportation Interventions: Intervention Not Indicated Utilities Interventions: Intervention Not Indicated Alcohol Usage Interventions: Intervention Not Indicated (Score <7)   Readmission Risk Interventions     No data to display

## 2022-05-16 NOTE — Care Management Important Message (Signed)
Important Message  Patient Details  Name: [name] MRN: [id number] Date of Birth: November 16, 1928   Medicare Important Message Given:        Shelda Altes 05/16/2022, 9:37 AM

## 2022-05-17 DIAGNOSIS — I5023 Acute on chronic systolic (congestive) heart failure: Secondary | ICD-10-CM | POA: Diagnosis not present

## 2022-05-17 LAB — GLUCOSE, CAPILLARY: Glucose-Capillary: 107 mg/dL — ABNORMAL HIGH (ref 70–99)

## 2022-05-17 LAB — BASIC METABOLIC PANEL
Anion gap: 11 (ref 5–15)
BUN: 49 mg/dL — ABNORMAL HIGH (ref 8–23)
CO2: 25 mmol/L (ref 22–32)
Calcium: 9 mg/dL (ref 8.9–10.3)
Chloride: 100 mmol/L (ref 98–111)
Creatinine, Ser: 1.9 mg/dL — ABNORMAL HIGH (ref 0.61–1.24)
GFR, Estimated: 33 mL/min — ABNORMAL LOW (ref >60)
Glucose, Bld: 100 mg/dL — ABNORMAL HIGH (ref 70–99)
Potassium: 3.9 mmol/L (ref 3.5–5.1)
Sodium: 136 mmol/L (ref 135–145)

## 2022-05-17 NOTE — TOC Transition Note (Signed)
Transition of Care [facility]) - CM/SW Discharge Note   Patient Details  Name: [name] MRN: [id number] Date of Birth: November 27, 1928  Transition of Care [facility]) CM/SW Contact:  Bartholomew Crews, RN Phone Number: [phone] 05/17/2022, 10:01 AM   Clinical Narrative:     Patient to transition home today. Previous RNCM spoke with patient about [facility] RN for disease management. Spoke with patient at the bedside to follow up - patient stated that both he and has his wife have an upcoming appointment for a nurse to visit (?[facility]). Advised that if has additional needs that PCP or cardiologist can make the appropriate referrals. Family to provide transportation home. No further TOC needs identified at this time.   Final next level of care: Home/Self Care Barriers to Discharge: No Barriers Identified   Patient Goals and CMS Choice   Choice offered to / list presented to : NA  Discharge Placement                         Discharge Plan and Services Additional resources added to the After Visit Summary for     Discharge Planning Services: CM Consult Post Acute Care Choice: NA            DME Agency: NA       HH Arranged: NA          Social Determinants of Health (SDOH) Interventions SDOH Screenings   Food Insecurity: No Food Insecurity (05/13/2022)  Housing: Low Risk  (05/13/2022)  Transportation Needs: No Transportation Needs (05/13/2022)  Utilities: Not At Risk (05/13/2022)  Alcohol Screen: Low Risk  (05/14/2022)  Depression (PHQ2-9): Low Risk  (07/12/2021)  Physical Activity: Insufficiently Active (07/12/2021)  Social Connections: Unknown (07/12/2021)  Stress: No Stress Concern Present (07/12/2021)  Tobacco Use: Medium Risk (05/12/2022)     Readmission Risk Interventions     No data to display

## 2022-05-19 ENCOUNTER — Telehealth: Payer: Self-pay | Admitting: *Deleted

## 2022-05-19 ENCOUNTER — Encounter: Payer: Self-pay | Admitting: *Deleted

## 2022-05-19 NOTE — Progress Notes (Signed)
  Care Coordination  Note  05/19/2022 Name: TARIN NAVAREZ MRN: [id number] DOB: November 13, 1928  [name] is a 89 y.o. year old primary care patient of Kuneff, Renee A, DO. I reached out to [name] by phone today to assist with scheduling a follow up appointment. Maia Petties Oltmann verbally consented to my assistance.       Follow up plan: Hospital Follow Up appointment scheduled with (Dr Raoul Pitch) on (05/21/2022) at (1120am).  Julian Hy, [place] Direct Dial: [phone]

## 2022-05-19 NOTE — Patient Outreach (Signed)
Care Coordination [name] Note Transition Care Management Follow-up Telephone Call Date of discharge and from where: Saturday, 05/17/22; [place]; Acute on chronic CHF exacerbation; SOB/ LE edema How have you been since you were released from the hospital? "I am much better; I just ran into problems with my heart and fluid overload; they took off about 17 pounds of fluid while I was there.  The hospital discharge doctor gave me a goal weight of 131 lbs, and I am running 128 lbs since I was released on Saturday.  I am weighing myself every day, and I understand to take an extra fluid pill if I start gaining weight.  I don't need to review all of my medications, they didn't make any changes at the hospital and I am taking them all as I am supposed to; the nurse went over them while I was in the hospital." Any questions or concerns? No  Items Reviewed: Did the pt receive and understand the discharge instructions provided? Yes  Medications obtained and verified? Yes - patient declined full medication review today- reports no discharge changes to medications and this was verified during TOC review; reports he reviewed medication with hospital discharging nurse, and now is not a good time for him to re-review; confirmed patient self-manages medications and today he denies questions/ concerns around medications and verbalizes a very good understanding without prompting of when to take extra fluid pill/ diuretic Other? No  Any new allergies since your discharge? No  Dietary orders reviewed? Yes Do you have support at home? Yes  family members and wife assists as needed; patient reports he is essentially independent in self-care activities  Home Care and Equipment/Supplies: Were home health services ordered? no If so, what is the name of the agency? N/A  Has the agency set up a time to come to the patient's home? no Were any new equipment or medical supplies ordered?  No What is the name of the medical  supply agency? N/A Were you able to get the supplies/equipment? not applicable Do you have any questions related to the use of the equipment or supplies? No N/A  Functional Questionnaire: (I = Independent and D = Dependent) ADLs: I  Bathing/Dressing- I  Meal Prep- I  Eating- I  Maintaining continence- I  Transferring/Ambulation- I  Managing Meds- I  Follow up appointments reviewed:  PCP Hospital f/u appt confirmed? No  Scheduled to see - on - @ - placed request with scheduling care guide to facilitate scheduling of hospital follow up office visit with PCP [facility] f/u appt confirmed? No  Scheduled to see - on - @ - Are transportation arrangements needed? No  If their condition worsens, is the pt aware to call PCP or go to the Emergency Dept.? Yes Was the patient provided with contact information for the PCP's office or ED? No- patient declined; reports already has contact information for all care providers Was to pt encouraged to call back with questions or concerns? Yes  SDOH assessments and interventions completed:   Yes SDOH Interventions Today    Flowsheet Row Most Recent Value  SDOH Interventions   Food Insecurity Interventions Intervention Not Indicated  Transportation Interventions Intervention Not Indicated  [family members provide transportation]      Care Coordination Interventions:  PCP follow up appointment requested Provided education/ reinforcement around self-health management of CHF at home; confirmed patient is monitoring and recording daily weights at home and is following strict low-salt diet    Encounter Outcome:  Pt. Visit Completed    Oneta Rack, RN, BSN, CCRN Alumnus RN CM Care Coordination/ Transition of [place] Management [phone]: direct office

## 2022-05-19 NOTE — Discharge Summary (Signed)
Physician Discharge Summary  [name] JHE:[id number] DOB: September 28, 1928 DOA: 05/12/2022  PCP: [name], DO  Admit date: 05/12/2022 Discharge date: 05/17/2022  Time spent: 45 minutes  Recommendations for Outpatient Follow-up:  Cardiology Dr. Johnsie Cancel in 2 weeks   Discharge Diagnoses:  Principal Problem: Acute on chronic systolic CHF CKD 3b History of third-degree AV block status post PPM Paroxysmal atrial fibrillation Type 2 diabetes mellitus CAD/CABG Anemia of chronic disease Hypokalemia   Discharge Condition: Improved  Diet recommendation: Low-sodium  Filed Weights   05/15/22 0419 05/16/22 0404 05/17/22 0602  Weight: 64.2 kg 59.6 kg 58.3 kg    History of present illness:  89/M with history of chronic systolic CHF, EF 18-56%, CAD/CABG, third-degree AV block with PPM, paroxysmal A-fib on Eliquis, CKD 3b,, anemia of chronic disease presented to the ED with worsening shortness of breath and peripheral edema.  Increased symptoms over 2 weeks, compliant with meds, has been eating out more recently on account of wife's illness In the ER SBP 160s, other vital stable, chest x-ray with bilateral pleural effusion, creatinine stable at 1.9  Hospital Course:   Acute on chronic HFrEF decompensation -Last echo with EF 40-45%, per cards clinic notes ischemic evaluation not pursued in the past on account of advanced age and CKD  -Compliant with meds, suspect exacerbation could be secondary to dietary indiscretion, eating out more in the last 1 month  -Improving with diuresis, 7 L negative, volume status has improved considerably, changed to oral Lasix, continue Coreg, weaned off oxygen -GDMT limited by CKD -Advised to take an extra Lasix dose for weight gain of 3 pounds in a day and 5 pounds in a week -Follow-up with Dr. Johnsie Cancel in 2 to 3 weeks   CKD stage IIIb -creatinine improved compared to recent baseline of 2, now 1.8 -Diuresis as above   IIDM with hyperglycemia -CBGs  are stable, continue sliding scale insulin   Hx of third-degree AV block with PPM -No acute issue, EKG shows paced rhythm   PAF -Paced rhythm, heart rate controlled, continue Eliquis    Abnormal urinalysis -Asymptomatic bacteriuria, no indication for antibiotics   Chronic normocytic anemia secondary to CKD -Outpatient EPO injection   CAD/CABG -Troponin trending 34>35, no chest pain -Likely secondary to CKD and CHF decompensation, ACS ruled out.   Hypokalemia -Replace  Discharge Exam: Vitals:   05/17/22 0602 05/17/22 0943  BP: (!) 156/64 (!) 150/54  Pulse: (!) 59 80  Resp: 16   Temp: 98.3 F (36.8 C)   SpO2: 94% 96%   General exam: Elderly pleasant male sitting up in bed, AAOx3, no distress HEENT: No JVD CVS: S1-S2, regular rhythm Lungs: decreased BS Abdomen: Soft, nontender, bowel sounds present Extremities: No edema Skin: No rashes Psychiatry:  Mood & affect appropriate.   Discharge Instructions   Discharge Instructions     Diet - low sodium heart healthy   Complete by: As directed    Discharge instructions   Complete by: As directed    Take extra dose of lasix for weight gain, 3lbs in 1 day or 5lbs in 1 week   Increase activity slowly   Complete by: As directed       Allergies as of 05/17/2022       Reactions   Lisinopril Other (See Comments)    he has difficulty controlling potassium on ace/arb- lisinopril was dc'd in the past 2/2 to hyperkalemia documented by [facility] P[facility] 01/2021   Penicillins Hives   Has patient had a  PCN reaction causing immediate rash, facial/tongue/throat swelling, SOB or lightheadedness with hypotension: No Has patient had a PCN reaction causing severe rash involving mucus membranes or skin necrosis: Yes Has patient had a PCN reaction that required hospitalization: No Has patient had a PCN reaction occurring within the last 10 years: No If all of the above answers are "NO", then may proceed with Cephalosporin use.   Vancomycin  Other (See Comments)   Edema and myalgia        Medication List     TAKE these medications    allopurinol 300 MG tablet Commonly known as: ZYLOPRIM Take 0.5 tablets (150 mg total) by mouth daily.   amLODipine 5 MG tablet Commonly known as: NORVASC Take 1 tablet (5 mg total) by mouth at bedtime.   atorvastatin 20 MG tablet Commonly known as: LIPITOR Take 1 tablet (20 mg total) by mouth at bedtime.   carvedilol 6.25 MG tablet Commonly known as: COREG Take 1 tablet (6.25 mg total) by mouth 2 (two) times daily with a meal.   Cholecalciferol 125 MCG (5000 UT) capsule Take 5,000 Units by mouth daily.   dicyclomine 10 MG capsule Commonly known as: BENTYL Take 10 mg by mouth every 6 (six) hours as needed.   Eliquis 2.5 MG Tabs tablet Generic drug: apixaban TAKE 1 TABLET TWICE A DAY   finasteride 5 MG tablet Commonly known as: PROSCAR Take 5 mg by mouth daily.   fish oil-omega-3 fatty acids 1000 MG capsule Take 1 g by mouth daily.   fluticasone 50 MCG/ACT nasal spray Commonly known as: FLONASE Place 2 sprays into both nostrils daily.   furosemide 40 MG tablet Commonly known as: LASIX Take 1 tablet (40 mg total) by mouth daily.   latanoprost 0.005 % ophthalmic solution Commonly known as: XALATAN Place 1 drop into both eyes at bedtime.   onetouch ultrasoft lancets Use as instructed   OneTouch Verio test strip Generic drug: glucose blood Use as instructed   potassium chloride 10 MEQ tablet Commonly known as: KLOR-CON Take 1 tablet (10 mEq total) by mouth daily.   PRESERVISION AREDS PO Take 1 tablet by mouth 2 (two) times daily.   PROBIOTIC PO Take 1 tablet by mouth daily.   tamsulosin 0.4 MG Caps capsule Commonly known as: FLOMAX Take 0.4 mg by mouth in the morning and at bedtime.   timolol 0.5 % ophthalmic solution Commonly known as: TIMOPTIC Place 1 drop into both eyes 2 (two) times daily.   UNABLE TO FIND Take 2 tablets by mouth every morning.  Med Name: Ester Rink (takes place of Co Q 10)       Allergies  Allergen Reactions   Lisinopril Other (See Comments)     he has difficulty controlling potassium on ace/arb- lisinopril was dc'd in the past 2/2 to hyperkalemia documented by [facility] P[facility] 01/2021   Penicillins Hives    Has patient had a PCN reaction causing immediate rash, facial/tongue/throat swelling, SOB or lightheadedness with hypotension: No Has patient had a PCN reaction causing severe rash involving mucus membranes or skin necrosis: Yes Has patient had a PCN reaction that required hospitalization: No Has patient had a PCN reaction occurring within the last 10 years: No If all of the above answers are "NO", then may proceed with Cephalosporin use.    Vancomycin Other (See Comments)    Edema and myalgia      The results of significant diagnostics from this hospitalization (including imaging, microbiology, ancillary and laboratory) are listed  below for reference.    Significant Diagnostic Studies: DG Chest 1 View  Result Date: 05/13/2022 CLINICAL DATA:  Congestive heart failure EXAM: CHEST  1 VIEW COMPARISON:  Yesterday FINDINGS: Pacer with leads at right atrium and right ventricle. No lead discontinuity. Midline trachea. Moderate cardiomegaly. Median sternotomy for CABG. Moderate left-sided pleural effusion with minimal lateral loculation, similar. Small right pleural effusion, increased. No pneumothorax. Skin folds over the inferolateral right hemithorax. Interstitial edema is moderate, slightly increased. Slight worsening in bibasilar airspace disease, greater left than right. IMPRESSION: Slightly worsened aeration with increased congestive failure, left-greater-than-right pleural effusions, and bibasilar airspace disease (most likely atelectasis). Electronically Signed   By: Abigail Miyamoto M.D.   On: 05/13/2022 14:35   DG Chest 2 View  Result Date: 05/12/2022 CLINICAL DATA:  URI with shortness of breath, cold and  congestion since last week. History of bladder cancer, stage III chronic renal failure, atrial fibrillation, diabetes mellitus, hypertension EXAM: CHEST - 2 VIEW COMPARISON:  09/16/2021 FINDINGS: LEFT subclavian sequential transvenous pacemaker leads project at RIGHT atrium and RIGHT ventricle. Borderline enlargement of cardiac silhouette post CABG. Atherosclerotic calcification aorta. Moderate LEFT pleural effusion with atelectasis versus consolidation LEFT lower lobe. Chronic minimal RIGHT pleural effusion and basilar atelectasis. Question mild pulmonary edema. No pneumothorax or acute osseous findings. IMPRESSION: Enlargement of cardiac silhouette with vascular congestion and question minimal edema. Significant increase in LEFT pleural effusion and basilar atelectasis. Aortic Atherosclerosis (ICD10-I70.0). Electronically Signed   By: Lavonia Dana M.D.   On: 05/12/2022 15:26   CUP PACEART REMOTE DEVICE CHECK  Result Date: 04/29/2022 Scheduled remote reviewed. Normal device function.  Next remote 91 days. Max   Microbiology: Recent Results (from the past 240 hour(s))  Resp panel by RT-PCR (RSV, Flu A&B, Covid) Anterior Nasal Swab     Status: None   Collection Time: 05/12/22  3:09 PM   Specimen: Anterior Nasal Swab  Result Value Ref Range Status   SARS Coronavirus 2 by RT PCR NEGATIVE NEGATIVE Final    Comment: (NOTE) SARS-CoV-2 target nucleic acids are NOT DETECTED.  The SARS-CoV-2 RNA is generally detectable in upper respiratory specimens during the acute phase of infection. The lowest concentration of SARS-CoV-2 viral copies this assay can detect is 138 copies/mL. A negative result does not preclude SARS-Cov-2 infection and should not be used as the sole basis for treatment or other patient management decisions. A negative result may occur with  improper specimen collection/handling, submission of specimen other than nasopharyngeal swab, presence of viral mutation(s) within the areas targeted  by this assay, and inadequate number of viral copies(<138 copies/mL). A negative result must be combined with clinical observations, patient history, and epidemiological information. The expected result is Negative.  Fact Sheet for Patients:  EntrepreneurPulse.com.au  Fact Sheet for Healthcare Providers:  IncredibleEmployment.be  This test is no t yet approved or cleared by the [place] FDA and  has been authorized for detection and/or diagnosis of SARS-CoV-2 by FDA under an Emergency Use Authorization (EUA). This EUA will remain  in effect (meaning this test can be used) for the duration of the COVID-19 declaration under Section 564(b)(1) of the Act, 21 U.S.C.section 360bbb-3(b)(1), unless the authorization is terminated  or revoked sooner.       Influenza A by PCR NEGATIVE NEGATIVE Final   Influenza B by PCR NEGATIVE NEGATIVE Final    Comment: (NOTE) The Xpert Xpress SARS-CoV-2/FLU/RSV plus assay is intended as an aid in the diagnosis of influenza from Nasopharyngeal swab specimens and  should not be used as a sole basis for treatment. Nasal washings and aspirates are unacceptable for Xpert Xpress SARS-CoV-2/FLU/RSV testing.  Fact Sheet for Patients: EntrepreneurPulse.com.au  Fact Sheet for Healthcare Providers: IncredibleEmployment.be  This test is not yet approved or cleared by the [place] FDA and has been authorized for detection and/or diagnosis of SARS-CoV-2 by FDA under an Emergency Use Authorization (EUA). This EUA will remain in effect (meaning this test can be used) for the duration of the COVID-19 declaration under Section 564(b)(1) of the Act, 21 U.S.C. section 360bbb-3(b)(1), unless the authorization is terminated or revoked.     Resp Syncytial Virus by PCR NEGATIVE NEGATIVE Final    Comment: (NOTE) Fact Sheet for Patients: EntrepreneurPulse.com.au  Fact  Sheet for Healthcare Providers: IncredibleEmployment.be  This test is not yet approved or cleared by the [place] FDA and has been authorized for detection and/or diagnosis of SARS-CoV-2 by FDA under an Emergency Use Authorization (EUA). This EUA will remain in effect (meaning this test can be used) for the duration of the COVID-19 declaration under Section 564(b)(1) of the Act, 21 U.S.C. section 360bbb-3(b)(1), unless the authorization is terminated or revoked.  Performed at [facility], [address] [place], [place] [address]      Labs: Basic Metabolic Panel: Recent Labs  Lab 05/12/22 1518 05/14/22 0253 05/15/22 0230 05/16/22 0326 05/17/22 0238  NA 134* 137 137 139 136  K 4.0 3.6 3.4* 4.1 3.9  CL 103 104 103 103 100  CO2 22 21* '23 27 25  '$ GLUCOSE 228* 111* 97 97 100*  BUN 41* 40* 42* 50* 49*  CREATININE 1.97* 1.87* 1.84* 2.06* 1.90*  CALCIUM 8.6* 8.3* 8.8* 9.0 9.0   Liver Function Tests: Recent Labs  Lab 05/12/22 1518 05/15/22 0230  AST 28 34  ALT 18 24  ALKPHOS 90 90  BILITOT 0.8 0.8  PROT 7.0 6.8  ALBUMIN 3.1* 2.8*   No results for input(s): "LIPASE", "AMYLASE" in the last 168 hours. No results for input(s): "AMMONIA" in the last 168 hours. CBC: Recent Labs  Lab 05/12/22 1518 05/15/22 0230  WBC 5.5 5.2  HGB 10.8* 10.7*  HCT 33.8* 33.6*  MCV 89.2 87.7  PLT 166 187   Cardiac Enzymes: No results for input(s): "CKTOTAL", "CKMB", "CKMBINDEX", "TROPONINI" in the last 168 hours. BNP: BNP (last 3 results) Recent Labs    09/17/21 1400 09/18/21 0337 05/12/22 1518  BNP 2,003.5* 1,507.3* 1,574.9*    ProBNP (last 3 results) Recent Labs    08/20/21 1020 08/23/21 1041  PROBNP 1,616.0* 1,629.0*    CBG: Recent Labs  Lab 05/16/22 0824 05/16/22 1151 05/16/22 1646 05/16/22 2134 05/17/22 0907  GLUCAP 210* 129* 113* 139* 107*       Signed:  Domenic Polite MD.  [facility] 05/19/2022, 3:14 PM

## 2022-05-21 ENCOUNTER — Inpatient Hospital Stay: Payer: Medicare Other | Admitting: Family Medicine

## 2022-05-23 ENCOUNTER — Telehealth: Payer: Self-pay

## 2022-05-23 ENCOUNTER — Inpatient Hospital Stay: Payer: Medicare Other | Admitting: Family Medicine

## 2022-05-23 NOTE — Progress Notes (Deleted)
[name] , [birth year], 89 y.o., male MRN: [id number] Patient Care Team    Relationship Specialty Notifications Start End  [name], DO PCP - General Family Medicine  12/18/15   [name], MD PCP - Cardiology Cardiology  03/17/17   Thompson Grayer, MD PCP - Electrophysiology Cardiology  06/26/21   [name], MD Consulting Physician Ophthalmology  12/18/15   [name], MD Consulting Physician Dermatology  12/18/15   [name], MD Consulting Physician Cardiology  12/18/15   [name], MD Consulting Physician Ophthalmology  12/19/15   [name], MD Consulting Physician Oncology  09/30/16   [name], MD Attending Physician Nephrology  09/30/16   [name], MD Consulting Physician Dermatology  09/30/16   Franchot Gallo, MD Consulting Physician Urology  06/30/19   Inocencio Homes, DPM Consulting Physician Podiatry  06/30/19     No chief complaint on file.    Subjective:  [name]  is a 89 y.o. male presents for hospital follow up after recent admission on *** for primary diagnosis ***. [name] was discharged on *** to ***. Patients discharge summary has been reviewed, as well as all labs/image studies obtained during hospitalization.  Medication reconciliation completed today.  Patients hospital course: *** Since hospital discharge patient reports ***  No results for input(s): "HGB", "HCT", "WBC", "PLT" in the last 168 hours.    Latest Ref Rng & Units 05/17/2022    2:38 AM 05/16/2022    3:26 AM 05/15/2022    2:30 AM  CMP  Glucose 70 - 99 mg/dL 100  97  97   BUN 8 - 23 mg/dL 49  50  42   Creatinine 0.61 - 1.24 mg/dL 1.90  2.06  1.84   Sodium 135 - 145 mmol/L 136  139  137   Potassium 3.5 - 5.1 mmol/L 3.9  4.1  3.4   Chloride 98 - 111 mmol/L 100  103  103   CO2 22 - 32 mmol/L '25  27  23   '$ Calcium 8.9 - 10.3 mg/dL 9.0  9.0  8.8   Total Protein 6.5 - 8.1 g/dL   6.8   Total Bilirubin 0.3 - 1.2 mg/dL   0.8   Alkaline Phos 38 - 126 U/L    90   AST 15 - 41 U/L   34   ALT 0 - 44 U/L   24       DG Chest 1 View  Result Date: 05/13/2022 CLINICAL DATA:  Congestive heart failure EXAM: CHEST  1 VIEW COMPARISON:  Yesterday FINDINGS: Pacer with leads at right atrium and right ventricle. No lead discontinuity. Midline trachea. Moderate cardiomegaly. Median sternotomy for CABG. Moderate left-sided pleural effusion with minimal lateral loculation, similar. Small right pleural effusion, increased. No pneumothorax. Skin folds over the inferolateral right hemithorax. Interstitial edema is moderate, slightly increased. Slight worsening in bibasilar airspace disease, greater left than right. IMPRESSION: Slightly worsened aeration with increased congestive failure, left-greater-than-right pleural effusions, and bibasilar airspace disease (most likely atelectasis). Electronically Signed   By: Abigail Miyamoto M.D.   On: 05/13/2022 14:35   DG Chest 2 View  Result Date: 05/12/2022 CLINICAL DATA:  URI with shortness of breath, cold and congestion since last week. History of bladder cancer, stage III chronic renal failure, atrial fibrillation, diabetes mellitus, hypertension EXAM: CHEST - 2 VIEW COMPARISON:  09/16/2021 FINDINGS: LEFT subclavian sequential transvenous pacemaker leads project at RIGHT atrium and RIGHT ventricle. Borderline  enlargement of cardiac silhouette post CABG. Atherosclerotic calcification aorta. Moderate LEFT pleural effusion with atelectasis versus consolidation LEFT lower lobe. Chronic minimal RIGHT pleural effusion and basilar atelectasis. Question mild pulmonary edema. No pneumothorax or acute osseous findings. IMPRESSION: Enlargement of cardiac silhouette with vascular congestion and question minimal edema. Significant increase in LEFT pleural effusion and basilar atelectasis. Aortic Atherosclerosis (ICD10-I70.0). Electronically Signed   By: Lavonia Dana M.D.   On: 05/12/2022 15:26   CUP PACEART REMOTE DEVICE CHECK  Result Date:  04/29/2022 Scheduled remote reviewed. Normal device function.  Next remote 91 days. [place]       07/12/2021    6:28 PM 07/12/2021    6:26 PM 02/11/2021    2:02 PM 05/02/2020    1:33 PM 11/11/2018    9:58 AM  Depression screen PHQ 2/9  Decreased Interest 0 0 0 0 0  Down, Depressed, Hopeless 0 0 0 0 0  PHQ - 2 Score 0 0 0 0 0    Allergies  Allergen Reactions   Lisinopril Other (See Comments)     he has difficulty controlling potassium on ace/arb- lisinopril was dc'd in the past 2/2 to hyperkalemia documented by [facility] PCP 01/2021   Penicillins Hives    Has patient had a PCN reaction causing immediate rash, facial/tongue/throat swelling, SOB or lightheadedness with hypotension: No Has patient had a PCN reaction causing severe rash involving mucus membranes or skin necrosis: Yes Has patient had a PCN reaction that required hospitalization: No Has patient had a PCN reaction occurring within the last 10 years: No If all of the above answers are "NO", then may proceed with Cephalosporin use.    Vancomycin Other (See Comments)    Edema and myalgia   Social History   Tobacco Use   Smoking status: Former   Smokeless tobacco: Former    Types: Chew    Quit date: 02/03/1969  Substance Use Topics   Alcohol use: No    Alcohol/week: 0.0 standard drinks of alcohol   Past Medical History:  Diagnosis Date   A-fib ([place])    Basal cell carcinoma    skin   Bigeminal rhythm    Bradycardia 06/21/2017   Cancer of anterior wall of urinary bladder ([place]) 07/18/2019   Cataract    Chronic renal insufficiency, stage 3 (moderate) ([place]) 2018   GFR 30s-40s   Coronary artery disease    post bypass   CVD (cardiovascular disease)    Diabetes mellitus without complication ([place])    type 2 diet controlled   Diverticular disease    Easy bruising    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   Gout    Hematuria 06/30/2019   Hernia    Hyperkalemia 05/06/2017   Hyperlipidemia    Hypertension    IBS (irritable  bowel syndrome)    Iron deficiency anemia 02/04/2016   Macular degeneration    Microscopic colitis    PVD (peripheral vascular disease) ([place])    Past Surgical History:  Procedure Laterality Date   CARDIAC CATHETERIZATION  2006   CARDIOVERSION N/A 02/22/2013   Procedure: CARDIOVERSION;  Surgeon: [name], MD;  Location: [place];  Service: Cardiovascular;  Laterality: N/A;   CARDIOVERSION N/A 11/15/2014   Procedure: CARDIOVERSION;  Surgeon: [name], MD;  Location: [place];  Service: Cardiovascular;  Laterality: N/A;   CARDIOVERSION N/A 04/01/2017   Procedure: CARDIOVERSION;  Surgeon: Larey Dresser, MD;  Location: [facility] ENDOSCOPY;  Service: Cardiovascular;  Laterality: N/A;   CAROTID ENDARTERECTOMY  2009/ 1993   left/ right   COLONOSCOPY  03/17/2005   The colon is normal.   CORONARY ARTERY BYPASS GRAFT  1999   CYSTOSCOPY W/ RETROGRADES Bilateral 07/14/2019   Procedure: CYSTOSCOPY WITH RETROGRADE PYELOGRAM;  Surgeon: Franchot Gallo, MD;  [facility];  Service: Urology;  Laterality: Bilateral;   ESOPHAGOGASTRODUODENOSCOPY  03/17/2005   Normal esophagus. Normal Stomanch. Normal duodenum.    EYE SURGERY     eyelid repair   INGUINAL HERNIA REPAIR  02/11/2012   Procedure: LAPAROSCOPIC BILATERAL INGUINAL HERNIA REPAIR;  Surgeon: Pedro Earls, MD;  Location: [facility];  Service: General;  Laterality: Bilateral;   PACEMAKER IMPLANT N/A 07/28/2017   St Jude Medical Assurity MRI conditional  dual-chamber pacemaker for symptomatic second degree AV block by Dr Rayann Heman   SKIN CANCER EXCISION     right ear x 3   TRANSURETHRAL RESECTION OF BLADDER TUMOR N/A 09/05/2019   Procedure: TRANSURETHRAL RESECTION OF BLADDER TUMOR (TURBT);  Surgeon: Franchot Gallo, MD;  [facility];  Service: Urology;  Laterality: N/A;   TRANSURETHRAL RESECTION OF BLADDER TUMOR WITH MITOMYCIN-C N/A 07/14/2019   Procedure: TRANSURETHRAL RESECTION OF  BLADDER TUMOR WITH GEMCITABINE IN PACU;  Surgeon: Franchot Gallo, MD;  Location[facility];  Service: Urology;  Laterality: N/A;   Family History  Problem Relation Age of Onset   Stroke Mother    Coronary artery disease Father    Heart disease Father    Melanoma Sister    Colon cancer Neg Hx    Allergies as of 05/23/2022       Reactions   Lisinopril Other (See Comments)    he has difficulty controlling potassium on ace/arb- lisinopril was dc'd in the past 2/2 to hyperkalemia documented by [facility] PCP 01/2021   Penicillins Hives   Has patient had a PCN reaction causing immediate rash, facial/tongue/throat swelling, SOB or lightheadedness with hypotension: No Has patient had a PCN reaction causing severe rash involving mucus membranes or skin necrosis: Yes Has patient had a PCN reaction that required hospitalization: No Has patient had a PCN reaction occurring within the last 10 years: No If all of the above answers are "NO", then may proceed with Cephalosporin use.   Vancomycin Other (See Comments)   Edema and myalgia        Medication List        Accurate as of May 23, 2022  9:11 AM. If you have any questions, ask your nurse or doctor.          allopurinol 300 MG tablet Commonly known as: ZYLOPRIM Take 0.5 tablets (150 mg total) by mouth daily.   amLODipine 5 MG tablet Commonly known as: NORVASC Take 1 tablet (5 mg total) by mouth at bedtime.   atorvastatin 20 MG tablet Commonly known as: LIPITOR Take 1 tablet (20 mg total) by mouth at bedtime.   carvedilol 6.25 MG tablet Commonly known as: COREG Take 1 tablet (6.25 mg total) by mouth 2 (two) times daily with a meal.   Cholecalciferol 125 MCG (5000 UT) capsule Take 5,000 Units by mouth daily.   dicyclomine 10 MG capsule Commonly known as: BENTYL Take 10 mg by mouth every 6 (six) hours as needed.   Eliquis 2.5 MG Tabs tablet Generic drug: apixaban TAKE 1 TABLET TWICE A DAY    finasteride 5 MG tablet Commonly known as: PROSCAR Take 5 mg by mouth daily.   fish oil-omega-3 fatty acids 1000 MG capsule Take  1 g by mouth daily.   fluticasone 50 MCG/ACT nasal spray Commonly known as: FLONASE Place 2 sprays into both nostrils daily.   furosemide 40 MG tablet Commonly known as: LASIX Take 1 tablet (40 mg total) by mouth daily.   latanoprost 0.005 % ophthalmic solution Commonly known as: XALATAN Place 1 drop into both eyes at bedtime.   onetouch ultrasoft lancets Use as instructed   OneTouch Verio test strip Generic drug: glucose blood Use as instructed   potassium chloride 10 MEQ tablet Commonly known as: KLOR-CON Take 1 tablet (10 mEq total) by mouth daily.   PRESERVISION AREDS PO Take 1 tablet by mouth 2 (two) times daily.   PROBIOTIC PO Take 1 tablet by mouth daily.   tamsulosin 0.4 MG Caps capsule Commonly known as: FLOMAX Take 0.4 mg by mouth in the morning and at bedtime.   timolol 0.5 % ophthalmic solution Commonly known as: TIMOPTIC Place 1 drop into both eyes 2 (two) times daily.   UNABLE TO FIND Take 2 tablets by mouth every morning. Med Name: Ester Rink (takes place of Co Q 10)        All past medical history, surgical history, allergies, family history, immunizations and medications were updated in the EMR today and reviewed under the history and medication portions of their EMR.      ROS: Negative, with the exception of above mentioned in HPI   Objective:  There were no vitals taken for this visit. There is no height or weight on file to calculate BMI. Gen: Afebrile. No acute distress. Nontoxic in appearance, well developed, well nourished.  HENT: AT. [place]. Bilateral TM visualized ***. MMM, no oral lesions. Bilateral nares ***. Throat without erythema or exudates. *** Eyes:Pupils Equal Round Reactive to light, Extraocular movements intact,  Conjunctiva without redness, discharge or icterus. Neck/lymp/endocrine: Supple,***  lymphadenopathy CV: RRR ***, ***edema Chest: CTAB, no wheeze or crackles. Good air movement, normal resp effort.  Abd: Soft. ***. NTND. BS ***. *** Masses palpated. No rebound or guarding. *** MSK: *** Skin: *** rashes, purpura or petechiae.  Neuro: *** Normal gait. PERLA. EOMi. Alert. Oriented x3 Cranial nerves II through XII intact. Muscle strength 5/5 *** extremity. DTRs equal bilaterally. Psych: Normal affect, dress and demeanor. Normal speech. Normal thought content and judgment.    Assessment/Plan: [name] is a 89 y.o. male present for OV for Hospital discharge follow up *** Reviewed expectations re: course of current medical issues. Discussed self-management of symptoms. Outlined signs and symptoms indicating need for more acute intervention. Patient verbalized understanding and all questions were answered. Patient received an After-Visit Summary. Any changes in medications were reviewed and patient was provided with updated med list with their AVS.     No orders of the defined types were placed in this encounter.    Note is dictated utilizing voice recognition software. Although note has been proof read prior to signing, occasional typographical errors still can be missed. If any questions arise, please do not hesitate to call for verification.   electronically signed by:  Howard Pouch, DO  [place]

## 2022-05-23 NOTE — Telephone Encounter (Signed)
Spoke with patient to advise he needs to follow up with cardiologist (Dr.Nishan) in the next 2-3 weeks. Provider was made aware and agrees with discharge plan to see cardiology. He was given Dr.Nishan's office number to contact to schedule. Appt for today will be canceled.

## 2022-05-27 DIAGNOSIS — D034 Melanoma in situ of scalp and neck: Secondary | ICD-10-CM | POA: Diagnosis not present

## 2022-05-27 DIAGNOSIS — Z85828 Personal history of other malignant neoplasm of skin: Secondary | ICD-10-CM | POA: Diagnosis not present

## 2022-05-27 DIAGNOSIS — C44319 Basal cell carcinoma of skin of other parts of face: Secondary | ICD-10-CM | POA: Diagnosis not present

## 2022-05-27 DIAGNOSIS — D485 Neoplasm of uncertain behavior of skin: Secondary | ICD-10-CM | POA: Diagnosis not present

## 2022-05-28 ENCOUNTER — Ambulatory Visit: Payer: Medicare Other | Admitting: Nurse Practitioner

## 2022-05-29 ENCOUNTER — Other Ambulatory Visit: Payer: Self-pay | Admitting: *Deleted

## 2022-05-29 ENCOUNTER — Encounter: Payer: Self-pay | Admitting: *Deleted

## 2022-05-29 ENCOUNTER — Telehealth: Payer: Self-pay | Admitting: *Deleted

## 2022-05-29 DIAGNOSIS — C673 Malignant neoplasm of anterior wall of bladder: Secondary | ICD-10-CM

## 2022-05-29 DIAGNOSIS — D631 Anemia in chronic kidney disease: Secondary | ICD-10-CM

## 2022-05-29 DIAGNOSIS — D500 Iron deficiency anemia secondary to blood loss (chronic): Secondary | ICD-10-CM

## 2022-05-29 DIAGNOSIS — D509 Iron deficiency anemia, unspecified: Secondary | ICD-10-CM

## 2022-05-29 NOTE — Patient Instructions (Signed)
Visit Information  Thank you for taking time to visit with me today. Please don't hesitate to contact me if I can be of assistance to you.   Following are the goals we discussed today:   Goals Addressed             This Visit's Progress    COMPLETED: Care coordination activity       Care Coordination Interventions: Advised patient to to contact [facility] via mail out contact for any future transportation needs. Pt current declined a referral at this time with sufficient transportation sources. Reviewed medications with patient and discussed adherence with no needed refills Reviewed scheduled/upcoming provider appointments including sufficient transportation sources. Reviewed all appointments as pt was  not aware of his appointments scheduled for tomorrow. Verified pt has transportation for all appointments. Screening for signs and symptoms of depression related to chronic disease state  Assessed social determinant of health barriers Educated on care management services related to Consulting civil engineer, pharmacy and social workers.  Pt states no immediate needs at this time. Denies any needs for care management services at this time. Verified pt continues to recover with no swelling related to his HF.          Please call the care guide team at [phone] if you need to cancel or reschedule your appointment.   If you are experiencing a Mental Health or [place] or need someone to talk to, please call the Suicide and Crisis Lifeline: 988  The patient verbalized understanding of instructions, educational materials, and care plan provided today and agreed to receive a mailed copy of patient instructions, educational materials, and care plan.   No further follow up required: No needs  Raina Mina, RN Care Management Coordinator [employer] Office [phone]

## 2022-05-29 NOTE — Patient Outreach (Signed)
  Care Coordination   Initial Visit Note   05/29/2022 Name: [name] MRN: [id number] DOB: Sep 29, 1928  [name] is a 89 y.o. year old male who sees Kuneff, Renee A, DO for primary care. I spoke with  [name] by phone today.  What matters to the patients health and wellness today?  No needs    Goals Addressed             This Visit's Progress    COMPLETED: Care coordination activity       Care Coordination Interventions: Advised patient to to contact [facility] via mail out contact for any future transportation needs. Pt current declined a referral at this time with sufficient transportation sources. Reviewed medications with patient and discussed adherence with no needed refills Reviewed scheduled/upcoming provider appointments including sufficient transportation sources. Reviewed all appointments as pt was  not aware of his appointments scheduled for tomorrow. Verified pt has transportation for all appointments. Screening for signs and symptoms of depression related to chronic disease state  Assessed social determinant of health barriers Educated on care management services related to Consulting civil engineer, pharmacy and social workers.  Pt states no immediate needs at this time. Denies any needs for care management services at this time. Verified pt continues to recover with no swelling related to his HF.         SDOH assessments and interventions completed:  Yes  SDOH Interventions Today    Flowsheet Row Most Recent Value  SDOH Interventions   Food Insecurity Interventions Intervention Not Indicated  Housing Interventions Intervention Not Indicated  Transportation Interventions Intervention Not Indicated  Utilities Interventions Intervention Not Indicated        Care Coordination Interventions:  Yes, provided   Follow up plan: No further intervention required.   Encounter Outcome:  Pt. Visit Completed   William Mina, RN Care Management  Coordinator [place] Office [phone]

## 2022-05-29 NOTE — Progress Notes (Signed)
Remote pacemaker transmission.   

## 2022-05-30 ENCOUNTER — Inpatient Hospital Stay: Payer: Medicare Other

## 2022-05-30 ENCOUNTER — Inpatient Hospital Stay: Payer: Medicare Other | Attending: Hematology & Oncology

## 2022-05-30 ENCOUNTER — Inpatient Hospital Stay (HOSPITAL_BASED_OUTPATIENT_CLINIC_OR_DEPARTMENT_OTHER): Payer: Medicare Other | Admitting: Hematology & Oncology

## 2022-05-30 ENCOUNTER — Encounter: Payer: Self-pay | Admitting: Hematology & Oncology

## 2022-05-30 VITALS — BP 148/53 | HR 60 | Temp 97.7°F | Resp 20 | Ht 67.0 in | Wt 140.1 lb

## 2022-05-30 DIAGNOSIS — D631 Anemia in chronic kidney disease: Secondary | ICD-10-CM

## 2022-05-30 DIAGNOSIS — N184 Chronic kidney disease, stage 4 (severe): Secondary | ICD-10-CM | POA: Insufficient documentation

## 2022-05-30 DIAGNOSIS — N183 Chronic kidney disease, stage 3 unspecified: Secondary | ICD-10-CM | POA: Diagnosis not present

## 2022-05-30 DIAGNOSIS — C673 Malignant neoplasm of anterior wall of bladder: Secondary | ICD-10-CM

## 2022-05-30 DIAGNOSIS — D509 Iron deficiency anemia, unspecified: Secondary | ICD-10-CM

## 2022-05-30 DIAGNOSIS — D500 Iron deficiency anemia secondary to blood loss (chronic): Secondary | ICD-10-CM

## 2022-05-30 DIAGNOSIS — D508 Other iron deficiency anemias: Secondary | ICD-10-CM

## 2022-05-30 LAB — CMP (CANCER CENTER ONLY)
ALT: 18 U/L (ref 0–44)
AST: 23 U/L (ref 15–41)
Albumin: 3.8 g/dL (ref 3.5–5.0)
Alkaline Phosphatase: 84 U/L (ref 38–126)
Anion gap: 9 (ref 5–15)
BUN: 46 mg/dL — ABNORMAL HIGH (ref 8–23)
CO2: 27 mmol/L (ref 22–32)
Calcium: 9.5 mg/dL (ref 8.9–10.3)
Chloride: 104 mmol/L (ref 98–111)
Creatinine: 2.01 mg/dL — ABNORMAL HIGH (ref 0.61–1.24)
GFR, Estimated: 31 mL/min — ABNORMAL LOW (ref >60)
Glucose, Bld: 168 mg/dL — ABNORMAL HIGH (ref 70–99)
Potassium: 4.7 mmol/L (ref 3.5–5.1)
Sodium: 140 mmol/L (ref 135–145)
Total Bilirubin: 0.6 mg/dL (ref 0.3–1.2)
Total Protein: 7.8 g/dL (ref 6.5–8.1)

## 2022-05-30 LAB — CBC WITH DIFFERENTIAL (CANCER CENTER ONLY)
Abs Immature Granulocytes: 0.1 10*3/uL — ABNORMAL HIGH (ref 0.00–0.07)
Basophils Absolute: 0 10*3/uL (ref 0.0–0.1)
Basophils Relative: 0 %
Eosinophils Absolute: 0.1 10*3/uL (ref 0.0–0.5)
Eosinophils Relative: 2 %
HCT: 31.9 % — ABNORMAL LOW (ref 39.0–52.0)
Hemoglobin: 9.9 g/dL — ABNORMAL LOW (ref 13.0–17.0)
Immature Granulocytes: 2 %
Lymphocytes Relative: 19 %
Lymphs Abs: 1.1 10*3/uL (ref 0.7–4.0)
MCH: 28.1 pg (ref 26.0–34.0)
MCHC: 31 g/dL (ref 30.0–36.0)
MCV: 90.6 fL (ref 80.0–100.0)
Monocytes Absolute: 0.6 10*3/uL (ref 0.1–1.0)
Monocytes Relative: 9 %
Neutro Abs: 4.3 10*3/uL (ref 1.7–7.7)
Neutrophils Relative %: 68 %
Platelet Count: 153 10*3/uL (ref 150–400)
RBC: 3.52 MIL/uL — ABNORMAL LOW (ref 4.22–5.81)
RDW: 16.8 % — ABNORMAL HIGH (ref 11.5–15.5)
WBC Count: 6.2 10*3/uL (ref 4.0–10.5)
nRBC: 0 % (ref 0.0–0.2)

## 2022-05-30 LAB — RETIC PANEL
Immature Retic Fract: 7.9 % (ref 2.3–15.9)
RBC.: 3.52 MIL/uL — ABNORMAL LOW (ref 4.22–5.81)
Retic Count, Absolute: 41.5 10*3/uL (ref 19.0–186.0)
Retic Ct Pct: 1.2 % (ref 0.4–3.1)
Reticulocyte Hemoglobin: 32.7 pg (ref >27.9)

## 2022-05-30 LAB — IRON AND IRON BINDING CAPACITY (CC-WL,HP ONLY)
Iron: 63 ug/dL (ref 45–182)
Saturation Ratios: 23 % (ref 17.9–39.5)
TIBC: 270 ug/dL (ref 250–450)
UIBC: 207 ug/dL (ref 117–376)

## 2022-05-30 LAB — FERRITIN: Ferritin: 371 ng/mL — ABNORMAL HIGH (ref 24–336)

## 2022-05-30 MED ORDER — DARBEPOETIN ALFA 300 MCG/0.6ML IJ SOSY
300.0000 ug | PREFILLED_SYRINGE | Freq: Once | INTRAMUSCULAR | Status: AC
  Administered 2022-05-30: 300 ug via SUBCUTANEOUS
  Filled 2022-05-30: qty 0.6

## 2022-05-30 NOTE — Progress Notes (Signed)
Hematology and Oncology Follow Up Visit  MEGAN HAYDUK [id number] 1928/08/03 89 y.o. 05/30/2022   Principle Diagnosis:  Erythropoietin deficiency anemia Iron deficiency anemia Chronic atrial fibrillation  Current Therapy:   IV iron as indicated -- feraheme given on 09/18/2021 Aranesp 300 g sq prn hemoglobin < 11 - last dose given on  09/19/2021 Eliquis 2.5 mg p.o. twice daily   Interim History:  Mr. Delahoussaye is here today for follow-up.  He actually was in the hospital about a week or so ago.  He was admitted with congestive heart failure.  I will know if he had rapid atrial fibrillation.  He said he lost 17 pounds in the hospital.  He really put on a lot of fluid weight.  He still has little bit of fluid around the ankles.  Otherwise, he seems to be managing.  He does have the macular degeneration.  His wife comes in a wheelchair.  She has spinal issues.  His last iron studies back in December showed a ferritin of 389 with an iron saturation of 20%.  It would be interesting to see what his iron level is now.  We may have to give him some IV iron.  Is been almost a year since he had iron.  He has had no problems with COVID or Influenza.  There is no change in bowel or bladder habits.  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  This was treated with a GI fascicular therapy.  Again he has had no problems with this.  He has had no problems with bleeding.  He has had no cough or shortness of breath.  He had the shortness of breath when he came to the hospital because of congestive heart failure.  Overall, I would say his performance status is probably ECOG 2.  Medications:  Allergies as of 05/30/2022       Reactions   Lisinopril Other (See Comments)    he has difficulty controlling potassium on ace/arb- lisinopril was dc'd in the past 2/2 to hyperkalemia documented by [facility] 01/2021   Penicillins Hives   Has patient had a PCN reaction causing immediate rash,  facial/tongue/throat swelling, SOB or lightheadedness with hypotension: No Has patient had a PCN reaction causing severe rash involving mucus membranes or skin necrosis: Yes Has patient had a PCN reaction that required hospitalization: No Has patient had a PCN reaction occurring within the last 10 years: No If all of the above answers are "NO", then may proceed with Cephalosporin use.   Vancomycin Other (See Comments)   Edema and myalgia        Medication List        Accurate as of May 30, 2022  1:12 PM. If you have any questions, ask your nurse or doctor.          allopurinol 300 MG tablet Commonly known as: ZYLOPRIM Take 0.5 tablets (150 mg total) by mouth daily.   amLODipine 5 MG tablet Commonly known as: NORVASC Take 1 tablet (5 mg total) by mouth at bedtime.   atorvastatin 20 MG tablet Commonly known as: LIPITOR Take 1 tablet (20 mg total) by mouth at bedtime.   carvedilol 6.25 MG tablet Commonly known as: COREG Take 1 tablet (6.25 mg total) by mouth 2 (two) times daily with a meal.   Cholecalciferol 125 MCG (5000 UT) capsule Take 5,000 Units by mouth daily.   dicyclomine 10 MG capsule Commonly known as: BENTYL Take 10 mg by mouth every  6 (six) hours as needed.   Eliquis 2.5 MG Tabs tablet Generic drug: apixaban TAKE 1 TABLET TWICE A DAY   finasteride 5 MG tablet Commonly known as: PROSCAR Take 5 mg by mouth daily.   fish oil-omega-3 fatty acids 1000 MG capsule Take 1 g by mouth daily.   fluticasone 50 MCG/ACT nasal spray Commonly known as: FLONASE Place 2 sprays into both nostrils daily.   furosemide 40 MG tablet Commonly known as: LASIX Take 1 tablet (40 mg total) by mouth daily.   latanoprost 0.005 % ophthalmic solution Commonly known as: XALATAN Place 1 drop into both eyes at bedtime.   onetouch ultrasoft lancets Use as instructed   OneTouch Verio test strip Generic drug: glucose blood Use as instructed   potassium chloride 10 MEQ  tablet Commonly known as: KLOR-CON Take 1 tablet (10 mEq total) by mouth daily.   PRESERVISION AREDS PO Take 1 tablet by mouth 2 (two) times daily.   PROBIOTIC PO Take 1 tablet by mouth daily.   tamsulosin 0.4 MG Caps capsule Commonly known as: FLOMAX Take 0.4 mg by mouth in the morning and at bedtime.   timolol 0.5 % ophthalmic solution Commonly known as: TIMOPTIC Place 1 drop into both eyes 2 (two) times daily.   UNABLE TO FIND Take 2 tablets by mouth every morning. Med Name: Ester Rink (takes place of Co Q 10)        Allergies:  Allergies  Allergen Reactions   Lisinopril Other (See Comments)     he has difficulty controlling potassium on ace/arb- lisinopril was dc'd in the past 2/2 to hyperkalemia documented by [facility] 01/2021   Penicillins Hives    Has patient had a PCN reaction causing immediate rash, facial/tongue/throat swelling, SOB or lightheadedness with hypotension: No Has patient had a PCN reaction causing severe rash involving mucus membranes or skin necrosis: Yes Has patient had a PCN reaction that required hospitalization: No Has patient had a PCN reaction occurring within the last 10 years: No If all of the above answers are "NO", then may proceed with Cephalosporin use.    Vancomycin Other (See Comments)    Edema and myalgia    Past Medical History, Surgical history, Social history, and Family History were reviewed and updated.  Review of Systems: Review of Systems  Constitutional: Negative.   HENT: Negative.    Eyes: Negative.   Respiratory: Negative.    Cardiovascular:  Positive for palpitations.  Gastrointestinal: Negative.   Genitourinary: Negative.   Musculoskeletal: Negative.   Skin: Negative.   Neurological: Negative.   Endo/Heme/Allergies: Negative.   Psychiatric/Behavioral: Negative.       Physical Exam:  height is '5\' 7"'$  (1.702 m) and weight is 140 lb 1.9 oz (63.6 kg). His tympanic temperature is 97.7 F (36.5 C). His blood  pressure is 148/53 (abnormal) and his pulse is 60. His respiration is 20 and oxygen saturation is 100%.   Wt Readings from Last 3 Encounters:  05/30/22 140 lb 1.9 oz (63.6 kg)  05/17/22 128 lb 9.6 oz (58.3 kg)  04/17/22 151 lb 1.3 oz (68.5 kg)    Physical Exam Vitals reviewed.  HENT:     Head: Normocephalic and atraumatic.  Eyes:     Pupils: Pupils are equal, round, and reactive to light.  Cardiovascular:     Rate and Rhythm: Normal rate and regular rhythm.     Heart sounds: Normal heart sounds.  Pulmonary:     Effort: Pulmonary effort is normal.  Breath sounds: Normal breath sounds.  Abdominal:     General: Bowel sounds are normal.     Palpations: Abdomen is soft.  Musculoskeletal:        General: Swelling present. No tenderness or deformity. Normal range of motion.     Cervical back: Normal range of motion.     Right lower leg: Edema present.     Left lower leg: Edema present.  Lymphadenopathy:     Cervical: No cervical adenopathy.  Skin:    General: Skin is warm and dry.     Findings: No erythema or rash.  Neurological:     Mental Status: He is alert and oriented to person, place, and time.  Psychiatric:        Behavior: Behavior normal.        Thought Content: Thought content normal.        Judgment: Judgment normal.      Lab Results  Component Value Date   WBC 6.2 05/30/2022   HGB 9.9 (L) 05/30/2022   HCT 31.9 (L) 05/30/2022   MCV 90.6 05/30/2022   PLT 153 05/30/2022   Lab Results  Component Value Date   FERRITIN 389 (H) 04/17/2022   IRON 50 04/17/2022   TIBC 251 04/17/2022   UIBC 201 04/17/2022   IRONPCTSAT 20 04/17/2022   Lab Results  Component Value Date   RETICCTPCT 1.2 05/30/2022   RBC 3.52 (L) 05/30/2022   No results found for: "KPAFRELGTCHN", "LAMBDASER", "KAPLAMBRATIO" No results found for: "IGGSERUM", "IGA", "IGMSERUM" Lab Results  Component Value Date   TOTALPROTELP 6.0 (L) 12/26/2015   ALBUMINELP 3.4 (L) 12/26/2015   A1GS 0.4  (H) 12/26/2015   A2GS 0.8 12/26/2015   BETS 0.4 12/26/2015   BETA2SER 0.3 12/26/2015   GAMS 0.8 12/26/2015   MSPIKE Not Observed 01/29/2016   SPEI SEE NOTE 12/26/2015     Chemistry      Component Value Date/Time   NA 140 05/30/2022 1144   NA 141 08/11/2017 0924   NA 145 04/09/2017 0905   NA 140 03/28/2016 0853   K 4.7 05/30/2022 1144   K 4.5 04/09/2017 0905   K 4.4 03/28/2016 0853   CL 104 05/30/2022 1144   CL 105 04/09/2017 0905   CO2 27 05/30/2022 1144   CO2 27 04/09/2017 0905   CO2 19 (L) 03/28/2016 0853   BUN 46 (H) 05/30/2022 1144   BUN 27 08/11/2017 0924   BUN 33 (H) 04/09/2017 0905   BUN 34.4 (H) 03/28/2016 0853   CREATININE 2.01 (H) 05/30/2022 1144   CREATININE 1.72 (H) 08/18/2017 1543   CREATININE 1.7 (H) 03/28/2016 0853   GLU 113 07/24/2016 0000      Component Value Date/Time   CALCIUM 9.5 05/30/2022 1144   CALCIUM 9.2 04/09/2017 0905   CALCIUM 9.2 03/28/2016 0853   ALKPHOS 84 05/30/2022 1144   ALKPHOS 69 04/09/2017 0905   ALKPHOS 85 03/28/2016 0853   AST 23 05/30/2022 1144   AST 23 03/28/2016 0853   ALT 18 05/30/2022 1144   ALT 27 04/09/2017 0905   ALT 37 03/28/2016 0853   BILITOT 0.6 05/30/2022 1144   BILITOT 0.79 03/28/2016 0853      Impression and Plan: Mr. Waggle is a very pleasant 89 yo caucasian gentleman with both erythropoietin deficiency and iron deficiency anemia.   We will go ahead and give him a dose of Aranesp.  It has been about 2 months since he had the Aranesp.  He will be interesting  to see what his iron levels are.  I think we will have to get him back relatively quickly.  I will get him back in 3 weeks.  I need to make sure that we maintain his hemoglobin at a good level so that he does not run into problems with congestive heart failure.  I see him in church on Sunday.  If he has any problems, he will certainly let me know.    [name], MD 2/2/20241:12 PM

## 2022-05-30 NOTE — Patient Instructions (Signed)

## 2022-06-02 ENCOUNTER — Telehealth: Payer: Self-pay

## 2022-06-02 NOTE — Telephone Encounter (Signed)
-----   Message from [name], MD sent at 05/30/2022  4:30 PM EST ----- Call - the iron is ok.  Laurey Arrow

## 2022-06-05 ENCOUNTER — Telehealth: Payer: Self-pay

## 2022-06-05 NOTE — Telephone Encounter (Signed)
Received medical clearance form for dental procedure. Placed on PCP desk for completion.

## 2022-06-09 NOTE — Telephone Encounter (Addendum)
Received request from dentist office concerning patient's Eliquis use and his upcoming dental procedure.  Per the medical clearance form he is to undergo extractions under local anesthetic with epinephrine.   Recommendations on his anticoagulant continuation is dependent upon how extensive the dental work is felt to be. If dental work includes 1-3 extractions: Hold morning dose of Eliquis day of dental procedure, take evening dose.   If dental work is expected to be over 3 extractions, hold Eliquis day of procedure, restart next day.   Dental procedures are generally considered low risk for bleeding.  Bleeding risk can be further reduced with use of topical hemostatic agents.   Please print and attach to form.  Thank you.   Electronically Signed by: Howard Pouch, DO [facility]

## 2022-06-09 NOTE — Telephone Encounter (Signed)
faxed

## 2022-06-17 DIAGNOSIS — E1151 Type 2 diabetes mellitus with diabetic peripheral angiopathy without gangrene: Secondary | ICD-10-CM | POA: Diagnosis not present

## 2022-06-17 DIAGNOSIS — L603 Nail dystrophy: Secondary | ICD-10-CM | POA: Diagnosis not present

## 2022-06-17 DIAGNOSIS — I739 Peripheral vascular disease, unspecified: Secondary | ICD-10-CM | POA: Diagnosis not present

## 2022-06-17 DIAGNOSIS — L84 Corns and callosities: Secondary | ICD-10-CM | POA: Diagnosis not present

## 2022-06-19 ENCOUNTER — Encounter: Payer: Self-pay | Admitting: Physician Assistant

## 2022-06-19 ENCOUNTER — Ambulatory Visit: Payer: Medicare Other | Attending: Nurse Practitioner | Admitting: Physician Assistant

## 2022-06-19 VITALS — BP 126/58 | HR 60 | Ht 67.0 in | Wt 148.8 lb

## 2022-06-19 DIAGNOSIS — I5032 Chronic diastolic (congestive) heart failure: Secondary | ICD-10-CM | POA: Diagnosis not present

## 2022-06-19 DIAGNOSIS — Z951 Presence of aortocoronary bypass graft: Secondary | ICD-10-CM | POA: Diagnosis not present

## 2022-06-19 DIAGNOSIS — I482 Chronic atrial fibrillation, unspecified: Secondary | ICD-10-CM | POA: Diagnosis not present

## 2022-06-19 DIAGNOSIS — Z950 Presence of cardiac pacemaker: Secondary | ICD-10-CM | POA: Insufficient documentation

## 2022-06-19 DIAGNOSIS — I2510 Atherosclerotic heart disease of native coronary artery without angina pectoris: Secondary | ICD-10-CM | POA: Diagnosis not present

## 2022-06-19 DIAGNOSIS — I779 Disorder of arteries and arterioles, unspecified: Secondary | ICD-10-CM | POA: Diagnosis not present

## 2022-06-19 NOTE — Progress Notes (Signed)
Office Visit    Patient Name: [name] Date of Encounter: 06/19/2022  PCP:  [name] DO   [place]  Cardiologist:  Jenkins Rouge, MD  Advanced Practice Provider:  No care team member to display Electrophysiologist:  Thompson Grayer, MD (Inactive)   HPI    [name] is a 89 y.o. male with a past medical history of CAD status post CABG in 1999 (LIMA to LAD, SVG to IM, sequential SVG to OM1, OM 2, SVG to D1, SVG to PDA), CHB status post [place] PPM in 2019, PAF, anemia on aranesp and iron infusions chronically presents today for follow-up appointment.  He was seen by Dr. Alfonse Spruce 05/2021 and at that visit was doing well.  No complaints of shortness of breath or chest pain.  TTE was completed 11/20/2020 with EF 40 to 45% and severe biatrial enlargement with moderate MR.  Shared decision given patient's age and renal failure that cardiac cath would not be pursued.  He was admitted 09/16/2021 for heart failure exacerbation.  Presented with shortness of breath and was admitted for diuresis with a chest x-ray noted small effusion, pulmonary vascular congestion, BNP 1700, creatinine was 2.4.  Lost 6.2 L.  Discharged in stable condition after 2 days with oral prescription 40 mg of Lasix as needed.  No further medication changes made at that time.  He was last seen by Ambrose Pancoast, NP 10/2021.  He had a hospital visit in May and that was his hospital follow-up visit.  There was some confusion regarding his appointment time.  He was euvolemic on exam.  Denied any chest pain or shortness of breath.  He was abstaining from salt in his diet.  Eating whole foods consisting of fruits and vegetables and very little processed food.  Blood pressure was elevated 140/50 and at home it was running in the [id number] systolic.  Today, he states he was recently in the hospital 1/15 through 05/17/2022 and had 7 L of fluid pulled off of him.  He was on IV Lasix.  He was admitted for  CHF exacerbation.  He is on daily Lasix 40 mg, can take an extra 40 mg if he weighs over 3 pounds in 1 night or 5 pounds in a week.  He is only had to do this twice since admission.  He weighs every morning and today weight 142 pounds at home on his scale.  He does request he can stay away from salt.  He states that the vein was taken out of his right leg for his CABG back in 1999 so that likely was small is a little bit more than the other.  We discussed elastic therapy in [place] for compression.  Reports no shortness of breath nor dyspnea on exertion. Reports no chest pain, pressure, or tightness. No orthopnea, PND. Reports no palpitations.    Past Medical History    Past Medical History:  Diagnosis Date   A-fib [facility])    Basal cell carcinoma    skin   Bigeminal rhythm    Bradycardia 06/21/2017   Cancer of anterior wall of urinary bladder ([place]) 07/18/2019   Cataract    Chronic renal insufficiency, stage 3 (moderate) ([place]) 2018   GFR 30s-40s   Coronary artery disease    post bypass   CVD (cardiovascular disease)    Diabetes mellitus without complication (HCC)    type 2 diet controlled   Diverticular disease    Easy bruising  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   Gout    Hematuria 06/30/2019   Hernia    Hyperkalemia 05/06/2017   Hyperlipidemia    Hypertension    IBS (irritable bowel syndrome)    Iron deficiency anemia 02/04/2016   Macular degeneration    Microscopic colitis    PVD (peripheral vascular disease) ([place])    Past Surgical History:  Procedure Laterality Date   CARDIAC CATHETERIZATION  2006   CARDIOVERSION N/A 02/22/2013   Procedure: CARDIOVERSION;  Surgeon: [name], MD;  Location: [place];  Service: Cardiovascular;  Laterality: N/A;   CARDIOVERSION N/A 11/15/2014   Procedure: CARDIOVERSION;  Surgeon: [name], MD;  Location: [facility] ENDOSCOPY;  Service: Cardiovascular;  Laterality: N/A;   CARDIOVERSION N/A 04/01/2017   Procedure: CARDIOVERSION;   Surgeon: Larey Dresser, MD;  Location[facility] ENDOSCOPY;  Service: Cardiovascular;  Laterality: N/A;   CAROTID ENDARTERECTOMY  2009/ 1993   left/ right   COLONOSCOPY  03/17/2005   The colon is normal.   CORONARY ARTERY BYPASS GRAFT  1999   CYSTOSCOPY W/ RETROGRADES Bilateral 07/14/2019   Procedure: CYSTOSCOPY WITH RETROGRADE PYELOGRAM;  Surgeon: Franchot Gallo, MD;  Location[facility];  Service: Urology;  Laterality: Bilateral;   ESOPHAGOGASTRODUODENOSCOPY  03/17/2005   Normal esophagus. Normal Stomanch. Normal duodenum.    EYE SURGERY     eyelid repair   INGUINAL HERNIA REPAIR  02/11/2012   Procedure: LAPAROSCOPIC BILATERAL INGUINAL HERNIA REPAIR;  Surgeon: Pedro Earls, MD;  Location: [facility];  Service: General;  Laterality: Bilateral;   PACEMAKER IMPLANT N/A 07/28/2017   [employer]urity MRI conditional  dual-chamber pacemaker for symptomatic second degree AV block by Dr Rayann Heman   SKIN CANCER EXCISION     right ear x 3   TRANSURETHRAL RESECTION OF BLADDER TUMOR N/A 09/05/2019   Procedure: TRANSURETHRAL RESECTION OF BLADDER TUMOR (TURBT);  Surgeon: Franchot Gallo, MD;  Location: [facility];  Service: Urology;  Laterality: N/A;   TRANSURETHRAL RESECTION OF BLADDER TUMOR WITH MITOMYCIN-C N/A 07/14/2019   Procedure: TRANSURETHRAL RESECTION OF BLADDER TUMOR WITH GEMCITABINE IN PACU;  Surgeon: Franchot Gallo, MD;  Location[facility];  Service: Urology;  Laterality: N/A;    Allergies  Allergies  Allergen Reactions   Lisinopril Other (See Comments)     he has difficulty controlling potassium on ace/arb- lisinopril was dc'd in the past 2/2 to hyperkalemia documented by [facility] 01/2021   Penicillins Hives    Has patient had a PCN reaction causing immediate rash, facial/tongue/throat swelling, SOB or lightheadedness with hypotension: No Has patient had a PCN reaction causing severe rash involving mucus membranes or skin  necrosis: Yes Has patient had a PCN reaction that required hospitalization: No Has patient had a PCN reaction occurring within the last 10 years: No If all of the above answers are "NO", then may proceed with Cephalosporin use.    Vancomycin Other (See Comments)    Edema and myalgia   EKGs/Labs/Other Studies Reviewed:   The following studies were reviewed today:  Echocardiogram 08/2021 IMPRESSIONS     1. Left ventricular ejection fraction, by estimation, is 40 to 45%. The  left ventricle has mildly decreased function. The left ventricle  demonstrates global hypokinesis with septal-lateral dyssynchrony  consistent with RV pacing. There is mild left  ventricular hypertrophy. Left ventricular diastolic parameters are  indeterminate.   2. Right ventricular systolic function is mildly reduced. The right  ventricular size is normal. There is moderately  elevated pulmonary artery  systolic pressure. The estimated right ventricular systolic pressure is  [id number] mmHg.   3. Left atrial size was moderately dilated.   4. Right atrial size was mildly dilated.   5. The mitral valve is normal in structure. Mild mitral valve  regurgitation. No evidence of mitral stenosis.   6. The aortic valve is tricuspid. Aortic valve regurgitation is trivial.  No aortic stenosis is present.   7. The inferior vena cava is dilated in size with <50% respiratory  variability, suggesting right atrial pressure of 15 mmHg.   EKG:  EKG is not ordered today.    Recent Labs: 08/23/2021: Pro B Natriuretic peptide (BNP) 1,629.0 05/12/2022: B Natriuretic Peptide 1,574.9 05/30/2022: ALT 18; BUN 46; Creatinine 2.01; Hemoglobin 9.9; Platelet Count 153; Potassium 4.7; Sodium 140  Recent Lipid Panel    Component Value Date/Time   CHOL 125 11/24/2019 1138   TRIG 67 11/24/2019 1138   HDL 57 11/24/2019 1138   CHOLHDL 2.2 11/24/2019 1138   VLDL 9 06/21/2017 0420   LDLCALC 54 11/24/2019 1138   LDLDIRECT 49 02/11/2021 1433      Home Medications   Current Meds  Medication Sig   allopurinol (ZYLOPRIM) 300 MG tablet Take 0.5 tablets (150 mg total) by mouth daily.   amLODipine (NORVASC) 5 MG tablet Take 1 tablet (5 mg total) by mouth at bedtime.   atorvastatin (LIPITOR) 20 MG tablet Take 1 tablet (20 mg total) by mouth at bedtime.   carvedilol (COREG) 6.25 MG tablet Take 1 tablet (6.25 mg total) by mouth 2 (two) times daily with a meal.   Cholecalciferol 125 MCG (5000 UT) capsule Take 5,000 Units by mouth daily.   dicyclomine (BENTYL) 10 MG capsule Take 10 mg by mouth every 6 (six) hours as needed.   ELIQUIS 2.5 MG TABS tablet TAKE 1 TABLET TWICE A DAY   finasteride (PROSCAR) 5 MG tablet Take 5 mg by mouth daily.   fish oil-omega-3 fatty acids 1000 MG capsule Take 1 g by mouth daily.    fluticasone (FLONASE) 50 MCG/ACT nasal spray Place 2 sprays into both nostrils daily.   furosemide (LASIX) 40 MG tablet Take 1 tablet (40 mg total) by mouth daily.   glucose blood (ONETOUCH VERIO) test strip Use as instructed   Lancets (ONETOUCH ULTRASOFT) lancets Use as instructed   latanoprost (XALATAN) 0.005 % ophthalmic solution Place 1 drop into both eyes at bedtime.   Multiple Vitamins-Minerals (PRESERVISION AREDS PO) Take 1 tablet by mouth 2 (two) times daily.    potassium chloride (KLOR-CON) 10 MEQ tablet Take 1 tablet (10 mEq total) by mouth daily.   Probiotic Product (PROBIOTIC PO) Take 1 tablet by mouth daily.   tamsulosin (FLOMAX) 0.4 MG CAPS capsule Take 0.4 mg by mouth in the morning and at bedtime.   timolol (TIMOPTIC) 0.5 % ophthalmic solution Place 1 drop into both eyes 2 (two) times daily.    UNABLE TO FIND Take 2 tablets by mouth every morning. Med Name: Ester Rink (takes place of Co Q 10)     Review of Systems      All other systems reviewed and are otherwise negative except as noted above.  Physical Exam    VS:  BP (!) 126/58   Pulse 60   Ht 5' 7"$  (1.702 m)   Wt 148 lb 12.8 oz (67.5 kg)   SpO2 99%    BMI 23.31 kg/m  , BMI Body mass index is 23.31 kg/m.  Wt Readings  from Last 3 Encounters:  06/19/22 148 lb 12.8 oz (67.5 kg)  05/30/22 140 lb 1.9 oz (63.6 kg)  05/17/22 128 lb 9.6 oz (58.3 kg)     GEN: Well nourished, well developed, in no acute distress. HEENT: normal. Neck: Supple, no JVD, + Left carotid bruit, or masses. Cardiac: irregularly irregular, no murmurs, rubs, or gallops. No clubbing, cyanosis, 1-2 + pitting edema R > L.  Radials/PT 2+ and equal bilaterally.  Respiratory:  Respirations regular and unlabored, clear to auscultation bilaterally. GI: Soft, nontender, nondistended. MS: No deformity or atrophy. Skin: Warm and dry, no rash. Neuro:  Strength and sensation are intact. Psych: Normal affect.  Assessment & Plan    CAD status post CABG -Continue amlodipine 5 mg daily, Eliquis 2.5 mg twice a day, Lipitor 20 mg daily, Coreg 6.25 mg twice daily, Lasix 40 mg daily, potassium 10 mEq daily, fish oil 1000 mg daily -no chest pain or increased SOB  Carotid artery disease -left bruit on exam -will order bilateral US   Atrial fibrillation on chronic anticoagulation -Continue Eliquis 2.5 mg twice a day -He is in rate controlled A-fib today  CHF with recent exacerbation -Mild lower extremity edema today, significantly better -Dry weight appears to be 142 pounds -Continue Lasix 40 daily and an extra 40 mg if lower extremity edema or gain more than 3 pounds overnight or 5 pounds in a week -Daily weights -Follow-up with nephrology, treatment somewhat limited due to creatinine greater than 2 -Low-sodium diet  PPM -no change, stable       Disposition: Follow up 3-4 months with Jenkins Rouge, MD or APP.  Signed, Elgie Collard, PA-C 06/19/2022, 4:59 PM [facility]

## 2022-06-19 NOTE — Patient Instructions (Signed)
Medication Instructions:  You may take an additional 40 mg of lasix as needed for weight gain of 3 lbs or more overnight *If you need a refill on your cardiac medications before your next appointment, please call your pharmacy*   Lab Work: None ordered If you have labs (blood work) drawn today and your tests are completely normal, you will receive your results only by: [place] (if you have MyChart) OR A paper copy in the mail If you have any lab test that is abnormal or we need to change your treatment, we will call you to review the results.   Testing/Procedures: Your physician has requested that you have a carotid duplex. This test is an ultrasound of the carotid arteries in your neck. It looks at blood flow through these arteries that supply the brain with blood. Allow one hour for this exam. There are no restrictions or special instructions.    Follow-Up: At [facility], you and your health needs are our priority.  As part of our continuing mission to provide you with exceptional heart care, we have created designated Provider Care Teams.  These Care Teams include your primary Cardiologist (physician) and Advanced Practice Providers (APPs -  Physician Assistants and Nurse Practitioners) who all work together to provide you with the care you need, when you need it.   Your next appointment:   3 months or next available   Provider:   Jenkins Rouge, MD    Other Instructions [facility] Address: [address] [place], [place], [place] Phone: [phone]  1.Weigh yourself daily after using the restroom before breakfast 2.Call and schedule an appointment with renal  Low-Sodium Eating Plan Sodium, which is an element that makes up salt, helps you maintain a healthy balance of fluids in your body. Too much sodium can increase your blood pressure and cause fluid and waste to be held in your body. Your health care provider or dietitian may recommend  following this plan if you have high blood pressure (hypertension), kidney disease, liver disease, or heart failure. Eating less sodium can help lower your blood pressure, reduce swelling, and protect your heart, liver, and kidneys. What are tips for following this plan? Reading food labels The Nutrition Facts label lists the amount of sodium in one serving of the food. If you eat more than one serving, you must multiply the listed amount of sodium by the number of servings. Choose foods with less than 140 mg of sodium per serving. Avoid foods with 300 mg of sodium or more per serving. Shopping  Look for lower-sodium products, often labeled as "low-sodium" or "no salt added." Always check the sodium content, even if foods are labeled as "unsalted" or "no salt added." Buy fresh foods. Avoid canned foods and pre-made or frozen meals. Avoid canned, cured, or processed meats. Buy breads that have less than 80 mg of sodium per slice. Cooking  Eat more home-cooked food and less restaurant, buffet, and fast food. Avoid adding salt when cooking. Use salt-free seasonings or herbs instead of table salt or sea salt. Check with your health care provider or pharmacist before using salt substitutes. Cook with plant-based oils, such as canola, sunflower, or olive oil. Meal planning When eating at a restaurant, ask that your food be prepared with less salt or no salt, if possible. Avoid dishes labeled as brined, pickled, cured, smoked, or made with soy sauce, miso, or teriyaki sauce. Avoid foods that contain MSG (monosodium glutamate). MSG is sometimes  added to [place] food, bouillon, and some canned foods. Make meals that can be grilled, baked, poached, roasted, or steamed. These are generally made with less sodium. General information Most people on this plan should limit their sodium intake to 1,500-2,000 mg (milligrams) of sodium each day. What foods should I eat? Fruits Fresh, frozen, or canned  fruit. Fruit juice. Vegetables Fresh or frozen vegetables. "No salt added" canned vegetables. "No salt added" tomato sauce and paste. Low-sodium or reduced-sodium tomato and vegetable juice. Grains Low-sodium cereals, including oats, puffed wheat and rice, and shredded wheat. Low-sodium crackers. Unsalted rice. Unsalted pasta. Low-sodium bread. Whole-grain breads and whole-grain pasta. Meats and other proteins Fresh or frozen (no salt added) meat, poultry, seafood, and fish. Low-sodium canned tuna and salmon. Unsalted nuts. Dried peas, beans, and lentils without added salt. Unsalted canned beans. Eggs. Unsalted nut butters. Dairy Milk. Soy milk. Cheese that is naturally low in sodium, such as ricotta cheese, fresh mozzarella, or Swiss cheese. Low-sodium or reduced-sodium cheese. Cream cheese. Yogurt. Seasonings and condiments Fresh and dried herbs and spices. Salt-free seasonings. Low-sodium mustard and ketchup. Sodium-free salad dressing. Sodium-free light mayonnaise. Fresh or refrigerated horseradish. Lemon juice. Vinegar. Other foods Homemade, reduced-sodium, or low-sodium soups. Unsalted popcorn and pretzels. Low-salt or salt-free chips. The items listed above may not be a complete list of foods and beverages you can eat. Contact a dietitian for more information. What foods should I avoid? Vegetables Sauerkraut, pickled vegetables, and relishes. Olives. [place] fries. Onion rings. Regular canned vegetables (not low-sodium or reduced-sodium). Regular canned tomato sauce and paste (not low-sodium or reduced-sodium). Regular tomato and vegetable juice (not low-sodium or reduced-sodium). Frozen vegetables in sauces. Grains Instant hot cereals. Bread stuffing, pancake, and biscuit mixes. Croutons. Seasoned rice or pasta mixes. Noodle soup cups. Boxed or frozen macaroni and cheese. Regular salted crackers. Self-rising flour. Meats and other proteins Meat or fish that is salted, canned, smoked,  spiced, or pickled. Precooked or cured meat, such as sausages or meat loaves. Berniece Salines. Ham. Pepperoni. Hot dogs. Corned beef. Chipped beef. Salt pork. Jerky. Pickled herring. Anchovies and sardines. Regular canned tuna. Salted nuts. Dairy Processed cheese and cheese spreads. Hard cheeses. Cheese curds. Blue cheese. Feta cheese. String cheese. Regular cottage cheese. Buttermilk. Canned milk. Fats and oils Salted butter. Regular margarine. Ghee. Bacon fat. Seasonings and condiments Onion salt, garlic salt, seasoned salt, table salt, and sea salt. Canned and packaged gravies. Worcestershire sauce. Tartar sauce. Barbecue sauce. Teriyaki sauce. Soy sauce, including reduced-sodium. Steak sauce. Fish sauce. Oyster sauce. Cocktail sauce. Horseradish that you find on the shelf. Regular ketchup and mustard. Meat flavorings and tenderizers. Bouillon cubes. Hot sauce. Pre-made or packaged marinades. Pre-made or packaged taco seasonings. Relishes. Regular salad dressings. Salsa. Other foods Salted popcorn and pretzels. Corn chips and puffs. Potato and tortilla chips. Canned or dried soups. Pizza. Frozen entrees and pot pies. The items listed above may not be a complete list of foods and beverages you should avoid. Contact a dietitian for more information. Summary Eating less sodium can help lower your blood pressure, reduce swelling, and protect your heart, liver, and kidneys. Most people on this plan should limit their sodium intake to 1,500-2,000 mg (milligrams) of sodium each day. Canned, boxed, and frozen foods are high in sodium. Restaurant foods, fast foods, and pizza are also very high in sodium. You also get sodium by adding salt to food. Try to cook at home, eat more fresh fruits and vegetables, and eat less fast food and canned, processed,  or prepared foods. This information is not intended to replace advice given to you by your health care provider. Make sure you discuss any questions you have with your  health care provider. Document Revised: 03/21/2019 Document Reviewed: 03/16/2019 Elsevier Patient Education  [place].

## 2022-06-20 ENCOUNTER — Inpatient Hospital Stay: Payer: Medicare Other

## 2022-06-20 ENCOUNTER — Inpatient Hospital Stay: Payer: Medicare Other | Admitting: Hematology & Oncology

## 2022-06-20 ENCOUNTER — Telehealth: Payer: Self-pay | Admitting: Cardiovascular Disease

## 2022-06-20 VITALS — BP 147/57 | HR 61 | Temp 98.0°F | Resp 19

## 2022-06-20 DIAGNOSIS — D631 Anemia in chronic kidney disease: Secondary | ICD-10-CM | POA: Diagnosis not present

## 2022-06-20 DIAGNOSIS — N184 Chronic kidney disease, stage 4 (severe): Secondary | ICD-10-CM | POA: Diagnosis not present

## 2022-06-20 DIAGNOSIS — I4819 Other persistent atrial fibrillation: Secondary | ICD-10-CM

## 2022-06-20 DIAGNOSIS — D508 Other iron deficiency anemias: Secondary | ICD-10-CM

## 2022-06-20 DIAGNOSIS — D509 Iron deficiency anemia, unspecified: Secondary | ICD-10-CM

## 2022-06-20 DIAGNOSIS — N183 Chronic kidney disease, stage 3 unspecified: Secondary | ICD-10-CM | POA: Diagnosis not present

## 2022-06-20 LAB — CMP (CANCER CENTER ONLY)
ALT: 14 U/L (ref 0–44)
AST: 20 U/L (ref 15–41)
Albumin: 4 g/dL (ref 3.5–5.0)
Alkaline Phosphatase: 85 U/L (ref 38–126)
Anion gap: 9 (ref 5–15)
BUN: 44 mg/dL — ABNORMAL HIGH (ref 8–23)
CO2: 27 mmol/L (ref 22–32)
Calcium: 9.3 mg/dL (ref 8.9–10.3)
Chloride: 102 mmol/L (ref 98–111)
Creatinine: 1.91 mg/dL — ABNORMAL HIGH (ref 0.61–1.24)
GFR, Estimated: 33 mL/min — ABNORMAL LOW (ref >60)
Glucose, Bld: 119 mg/dL — ABNORMAL HIGH (ref 70–99)
Potassium: 4.1 mmol/L (ref 3.5–5.1)
Sodium: 138 mmol/L (ref 135–145)
Total Bilirubin: 0.7 mg/dL (ref 0.3–1.2)
Total Protein: 7.4 g/dL (ref 6.5–8.1)

## 2022-06-20 LAB — CBC WITH DIFFERENTIAL (CANCER CENTER ONLY)
Abs Immature Granulocytes: 0.02 10*3/uL (ref 0.00–0.07)
Basophils Absolute: 0 10*3/uL (ref 0.0–0.1)
Basophils Relative: 0 %
Eosinophils Absolute: 0.1 10*3/uL (ref 0.0–0.5)
Eosinophils Relative: 1 %
HCT: 34 % — ABNORMAL LOW (ref 39.0–52.0)
Hemoglobin: 10.7 g/dL — ABNORMAL LOW (ref 13.0–17.0)
Immature Granulocytes: 0 %
Lymphocytes Relative: 19 %
Lymphs Abs: 1 10*3/uL (ref 0.7–4.0)
MCH: 28.2 pg (ref 26.0–34.0)
MCHC: 31.5 g/dL (ref 30.0–36.0)
MCV: 89.5 fL (ref 80.0–100.0)
Monocytes Absolute: 0.5 10*3/uL (ref 0.1–1.0)
Monocytes Relative: 9 %
Neutro Abs: 3.7 10*3/uL (ref 1.7–7.7)
Neutrophils Relative %: 71 %
Platelet Count: 152 10*3/uL (ref 150–400)
RBC: 3.8 MIL/uL — ABNORMAL LOW (ref 4.22–5.81)
RDW: 17.9 % — ABNORMAL HIGH (ref 11.5–15.5)
WBC Count: 5.3 10*3/uL (ref 4.0–10.5)
nRBC: 0 % (ref 0.0–0.2)

## 2022-06-20 LAB — RETICULOCYTES
Immature Retic Fract: 12.2 % (ref 2.3–15.9)
RBC.: 3.78 MIL/uL — ABNORMAL LOW (ref 4.22–5.81)
Retic Count, Absolute: 52.2 10*3/uL (ref 19.0–186.0)
Retic Ct Pct: 1.4 % (ref 0.4–3.1)

## 2022-06-20 LAB — FERRITIN: Ferritin: 202 ng/mL (ref 24–336)

## 2022-06-20 MED ORDER — APIXABAN 2.5 MG PO TABS: 2.5000 mg | ORAL_TABLET | Freq: Two times a day (BID) | ORAL | 1 refills | Status: DC

## 2022-06-20 MED ORDER — DARBEPOETIN ALFA 300 MCG/0.6ML IJ SOSY
300.0000 ug | PREFILLED_SYRINGE | Freq: Once | INTRAMUSCULAR | Status: AC
  Administered 2022-06-20: 300 ug via SUBCUTANEOUS
  Filled 2022-06-20: qty 0.6

## 2022-06-20 NOTE — Telephone Encounter (Signed)
Prescription refill request for Eliquis received. Indication: afib  Last office visit: [name], 06/19/2022 Scr: 1.91, 06/20/2022 Age: 89 yo  Weight: 67.5 kg   Refill sent.

## 2022-06-20 NOTE — Telephone Encounter (Signed)
*  STAT* If patient is at the pharmacy, call can be transferred to refill team.   1. Which medications need to be refilled? (please list name of each medication and dose if known) ELIQUIS 2.5 MG TABS tablet   2. Which pharmacy/location (including street and city if local pharmacy) is medication to be sent to? [id number]- [place], [place] - [address]   3. Do they need a 30 day or 90 day supply? 90 day

## 2022-06-20 NOTE — Patient Instructions (Signed)

## 2022-06-26 ENCOUNTER — Other Ambulatory Visit: Payer: Self-pay | Admitting: Family Medicine

## 2022-06-26 DIAGNOSIS — C434 Malignant melanoma of scalp and neck: Secondary | ICD-10-CM | POA: Diagnosis not present

## 2022-06-26 DIAGNOSIS — Z85828 Personal history of other malignant neoplasm of skin: Secondary | ICD-10-CM | POA: Diagnosis not present

## 2022-06-26 DIAGNOSIS — D034 Melanoma in situ of scalp and neck: Secondary | ICD-10-CM | POA: Diagnosis not present

## 2022-06-30 ENCOUNTER — Telehealth: Payer: Self-pay | Admitting: Family Medicine

## 2022-06-30 NOTE — Telephone Encounter (Signed)
Contacted [name] to schedule their annual wellness visit. Patient declined to schedule AWV at this time.Spoke with patient he moved to [place]; [place]: [phone]

## 2022-07-01 ENCOUNTER — Ambulatory Visit (HOSPITAL_COMMUNITY)
Admission: RE | Admit: 2022-07-01 | Discharge: 2022-07-01 | Disposition: A | Discharge status: Home / Self Care | Payer: Medicare Other | Source: Ambulatory Visit | Attending: Cardiology | Admitting: Cardiology

## 2022-07-01 ENCOUNTER — Encounter: Payer: Self-pay | Admitting: Hematology & Oncology

## 2022-07-01 DIAGNOSIS — I779 Disorder of arteries and arterioles, unspecified: Secondary | ICD-10-CM | POA: Insufficient documentation

## 2022-07-03 DIAGNOSIS — D1801 Hemangioma of skin and subcutaneous tissue: Secondary | ICD-10-CM | POA: Diagnosis not present

## 2022-07-03 DIAGNOSIS — L570 Actinic keratosis: Secondary | ICD-10-CM | POA: Diagnosis not present

## 2022-07-03 DIAGNOSIS — L812 Freckles: Secondary | ICD-10-CM | POA: Diagnosis not present

## 2022-07-03 DIAGNOSIS — Z85828 Personal history of other malignant neoplasm of skin: Secondary | ICD-10-CM | POA: Diagnosis not present

## 2022-07-03 DIAGNOSIS — L821 Other seborrheic keratosis: Secondary | ICD-10-CM | POA: Diagnosis not present

## 2022-07-04 DIAGNOSIS — Z8551 Personal history of malignant neoplasm of bladder: Secondary | ICD-10-CM | POA: Diagnosis not present

## 2022-07-04 DIAGNOSIS — D414 Neoplasm of uncertain behavior of bladder: Secondary | ICD-10-CM | POA: Diagnosis not present

## 2022-07-07 DIAGNOSIS — I129 Hypertensive chronic kidney disease with stage 1 through stage 4 chronic kidney disease, or unspecified chronic kidney disease: Secondary | ICD-10-CM | POA: Diagnosis not present

## 2022-07-07 DIAGNOSIS — I5020 Unspecified systolic (congestive) heart failure: Secondary | ICD-10-CM | POA: Diagnosis not present

## 2022-07-07 DIAGNOSIS — N184 Chronic kidney disease, stage 4 (severe): Secondary | ICD-10-CM | POA: Diagnosis not present

## 2022-07-07 DIAGNOSIS — D472 Monoclonal gammopathy: Secondary | ICD-10-CM | POA: Diagnosis not present

## 2022-07-07 DIAGNOSIS — I4891 Unspecified atrial fibrillation: Secondary | ICD-10-CM | POA: Diagnosis not present

## 2022-07-16 ENCOUNTER — Encounter: Payer: Self-pay | Admitting: Hematology & Oncology

## 2022-07-17 ENCOUNTER — Other Ambulatory Visit: Payer: Self-pay | Admitting: *Deleted

## 2022-07-17 DIAGNOSIS — D508 Other iron deficiency anemias: Secondary | ICD-10-CM

## 2022-07-17 DIAGNOSIS — D631 Anemia in chronic kidney disease: Secondary | ICD-10-CM

## 2022-07-17 DIAGNOSIS — D509 Iron deficiency anemia, unspecified: Secondary | ICD-10-CM

## 2022-07-17 DIAGNOSIS — C673 Malignant neoplasm of anterior wall of bladder: Secondary | ICD-10-CM

## 2022-07-18 ENCOUNTER — Encounter: Payer: Self-pay | Admitting: Hematology & Oncology

## 2022-07-18 ENCOUNTER — Inpatient Hospital Stay: Payer: Medicare Other

## 2022-07-18 ENCOUNTER — Inpatient Hospital Stay (HOSPITAL_BASED_OUTPATIENT_CLINIC_OR_DEPARTMENT_OTHER): Payer: Medicare Other | Admitting: Hematology & Oncology

## 2022-07-18 ENCOUNTER — Other Ambulatory Visit: Payer: Self-pay

## 2022-07-18 ENCOUNTER — Inpatient Hospital Stay: Payer: Medicare Other | Attending: Hematology & Oncology

## 2022-07-18 VITALS — BP 146/74 | HR 60 | Resp 17 | Ht 67.0 in | Wt 144.4 lb

## 2022-07-18 DIAGNOSIS — I482 Chronic atrial fibrillation, unspecified: Secondary | ICD-10-CM | POA: Diagnosis not present

## 2022-07-18 DIAGNOSIS — Z79899 Other long term (current) drug therapy: Secondary | ICD-10-CM | POA: Insufficient documentation

## 2022-07-18 DIAGNOSIS — Z7901 Long term (current) use of anticoagulants: Secondary | ICD-10-CM | POA: Diagnosis not present

## 2022-07-18 DIAGNOSIS — I509 Heart failure, unspecified: Secondary | ICD-10-CM | POA: Insufficient documentation

## 2022-07-18 DIAGNOSIS — D631 Anemia in chronic kidney disease: Secondary | ICD-10-CM

## 2022-07-18 DIAGNOSIS — C673 Malignant neoplasm of anterior wall of bladder: Secondary | ICD-10-CM

## 2022-07-18 DIAGNOSIS — D508 Other iron deficiency anemias: Secondary | ICD-10-CM

## 2022-07-18 DIAGNOSIS — M549 Dorsalgia, unspecified: Secondary | ICD-10-CM | POA: Insufficient documentation

## 2022-07-18 DIAGNOSIS — N183 Chronic kidney disease, stage 3 unspecified: Secondary | ICD-10-CM | POA: Insufficient documentation

## 2022-07-18 DIAGNOSIS — D509 Iron deficiency anemia, unspecified: Secondary | ICD-10-CM | POA: Diagnosis not present

## 2022-07-18 LAB — CBC WITH DIFFERENTIAL (CANCER CENTER ONLY)
Abs Immature Granulocytes: 0.04 10*3/uL (ref 0.00–0.07)
Basophils Absolute: 0 10*3/uL (ref 0.0–0.1)
Basophils Relative: 0 %
Eosinophils Absolute: 0.1 10*3/uL (ref 0.0–0.5)
Eosinophils Relative: 2 %
HCT: 35.1 % — ABNORMAL LOW (ref 39.0–52.0)
Hemoglobin: 11 g/dL — ABNORMAL LOW (ref 13.0–17.0)
Immature Granulocytes: 1 %
Lymphocytes Relative: 24 %
Lymphs Abs: 1.1 10*3/uL (ref 0.7–4.0)
MCH: 27.7 pg (ref 26.0–34.0)
MCHC: 31.3 g/dL (ref 30.0–36.0)
MCV: 88.4 fL (ref 80.0–100.0)
Monocytes Absolute: 0.6 10*3/uL (ref 0.1–1.0)
Monocytes Relative: 14 %
Neutro Abs: 2.6 10*3/uL (ref 1.7–7.7)
Neutrophils Relative %: 59 %
Platelet Count: 146 10*3/uL — ABNORMAL LOW (ref 150–400)
RBC: 3.97 MIL/uL — ABNORMAL LOW (ref 4.22–5.81)
RDW: 17.2 % — ABNORMAL HIGH (ref 11.5–15.5)
WBC Count: 4.5 10*3/uL (ref 4.0–10.5)
nRBC: 0 % (ref 0.0–0.2)

## 2022-07-18 LAB — CMP (CANCER CENTER ONLY)
ALT: 15 U/L (ref 0–44)
AST: 25 U/L (ref 15–41)
Albumin: 3.9 g/dL (ref 3.5–5.0)
Alkaline Phosphatase: 98 U/L (ref 38–126)
Anion gap: 10 (ref 5–15)
BUN: 40 mg/dL — ABNORMAL HIGH (ref 8–23)
CO2: 25 mmol/L (ref 22–32)
Calcium: 9.1 mg/dL (ref 8.9–10.3)
Chloride: 105 mmol/L (ref 98–111)
Creatinine: 1.86 mg/dL — ABNORMAL HIGH (ref 0.61–1.24)
GFR, Estimated: 34 mL/min — ABNORMAL LOW (ref >60)
Glucose, Bld: 153 mg/dL — ABNORMAL HIGH (ref 70–99)
Potassium: 4.4 mmol/L (ref 3.5–5.1)
Sodium: 140 mmol/L (ref 135–145)
Total Bilirubin: 0.5 mg/dL (ref 0.3–1.2)
Total Protein: 7.6 g/dL (ref 6.5–8.1)

## 2022-07-18 LAB — RETIC PANEL
Immature Retic Fract: 7.8 % (ref 2.3–15.9)
RBC.: 3.97 MIL/uL — ABNORMAL LOW (ref 4.22–5.81)
Retic Count, Absolute: 25.8 10*3/uL (ref 19.0–186.0)
Retic Ct Pct: 0.7 % (ref 0.4–3.1)
Reticulocyte Hemoglobin: 29.5 pg (ref >27.9)

## 2022-07-18 LAB — FERRITIN: Ferritin: 231 ng/mL (ref 24–336)

## 2022-07-18 LAB — IRON AND IRON BINDING CAPACITY (CC-WL,HP ONLY)
Iron: 36 ug/dL — ABNORMAL LOW (ref 45–182)
Saturation Ratios: 13 % — ABNORMAL LOW (ref 17.9–39.5)
TIBC: 283 ug/dL (ref 250–450)
UIBC: 247 ug/dL (ref 117–376)

## 2022-07-18 NOTE — Progress Notes (Signed)
Hematology and Oncology Follow Up Visit  BRIANNA BERTOLET [id number] 08-Oct-1928 89 y.o. 07/18/2022   Principle Diagnosis:  Erythropoietin deficiency anemia Iron deficiency anemia Chronic atrial fibrillation  Current Therapy:   IV iron as indicated -- feraheme given on 09/18/2021 Aranesp 300 g sq prn hemoglobin < 11 - last dose given on  09/19/2021 Eliquis 2.5 mg p.o. twice daily   Interim History:  Mr. Dyas is here today for follow-up.  He is doing pretty well.  Thankfully, he has not been back in the hospital with congestive heart failure.  He does have atrial fibrillation.  He is on low-dose Eliquis.  His wife is doing okay.  She is having her trouble with back pain.  She is trying to do some physical therapy, unfortunately, insurance will not pay for it.  His last iron studies that were done back in February showed a ferritin of 371 with an iron saturation of 23%.  He has had no problems with bowels or bladder.  There has been no bleeding.  He has had no cough.  There is been no leg swelling.  He has had no rashes.  Overall, his performance status is ECOG 2.   Medications:  Allergies as of 07/18/2022       Reactions   Lisinopril Other (See Comments)    he has difficulty controlling potassium on ace/arb- lisinopril was dc'd in the past 2/2 to hyperkalemia documented by [facility] PCP 01/2021   Penicillins Hives   Has patient had a PCN reaction causing immediate rash, facial/tongue/throat swelling, SOB or lightheadedness with hypotension: No Has patient had a PCN reaction causing severe rash involving mucus membranes or skin necrosis: Yes Has patient had a PCN reaction that required hospitalization: No Has patient had a PCN reaction occurring within the last 10 years: No If all of the above answers are "NO", then may proceed with Cephalosporin use.   Vancomycin Other (See Comments)   Edema and myalgia        Medication List        Accurate as of July 18, 2022  12:47 PM. If you have any questions, ask your nurse or doctor.          allopurinol 300 MG tablet Commonly known as: ZYLOPRIM Take 0.5 tablets (150 mg total) by mouth daily.   amLODipine 5 MG tablet Commonly known as: NORVASC TAKE 1 TABLET AT BEDTIME   apixaban 2.5 MG Tabs tablet Commonly known as: Eliquis Take 1 tablet (2.5 mg total) by mouth 2 (two) times daily.   atorvastatin 20 MG tablet Commonly known as: LIPITOR Take 1 tablet (20 mg total) by mouth at bedtime.   carvedilol 6.25 MG tablet Commonly known as: COREG Take 1 tablet (6.25 mg total) by mouth 2 (two) times daily with a meal.   Cholecalciferol 125 MCG (5000 UT) capsule Take 5,000 Units by mouth daily.   dicyclomine 10 MG capsule Commonly known as: BENTYL Take 10 mg by mouth every 6 (six) hours as needed.   finasteride 5 MG tablet Commonly known as: PROSCAR Take 5 mg by mouth daily.   fish oil-omega-3 fatty acids 1000 MG capsule Take 1 g by mouth daily.   fluticasone 50 MCG/ACT nasal spray Commonly known as: FLONASE Place 2 sprays into both nostrils daily.   furosemide 40 MG tablet Commonly known as: LASIX Take 1 tablet (40 mg total) by mouth daily.   latanoprost 0.005 % ophthalmic solution Commonly known as: XALATAN Place 1 drop into both eyes  at bedtime.   onetouch ultrasoft lancets Use as instructed   OneTouch Verio test strip Generic drug: glucose blood Use as instructed   potassium chloride 10 MEQ tablet Commonly known as: KLOR-CON Take 1 tablet (10 mEq total) by mouth daily.   PRESERVISION AREDS PO Take 1 tablet by mouth 2 (two) times daily.   PROBIOTIC PO Take 1 tablet by mouth daily.   tamsulosin 0.4 MG Caps capsule Commonly known as: FLOMAX Take 0.4 mg by mouth in the morning and at bedtime.   timolol 0.5 % ophthalmic solution Commonly known as: TIMOPTIC Place 1 drop into both eyes 2 (two) times daily.   UNABLE TO FIND Take 2 tablets by mouth every morning. Med Name:  Ester Rink (takes place of Co Q 10)        Allergies:  Allergies  Allergen Reactions   Lisinopril Other (See Comments)     he has difficulty controlling potassium on ace/arb- lisinopril was dc'd in the past 2/2 to hyperkalemia documented by [facility] PCP 01/2021   Penicillins Hives    Has patient had a PCN reaction causing immediate rash, facial/tongue/throat swelling, SOB or lightheadedness with hypotension: No Has patient had a PCN reaction causing severe rash involving mucus membranes or skin necrosis: Yes Has patient had a PCN reaction that required hospitalization: No Has patient had a PCN reaction occurring within the last 10 years: No If all of the above answers are "NO", then may proceed with Cephalosporin use.    Vancomycin Other (See Comments)    Edema and myalgia    Past Medical History, Surgical history, Social history, and Family History were reviewed and updated.  Review of Systems: Review of Systems  Constitutional: Negative.   HENT: Negative.    Eyes: Negative.   Respiratory: Negative.    Cardiovascular:  Positive for palpitations.  Gastrointestinal: Negative.   Genitourinary: Negative.   Musculoskeletal: Negative.   Skin: Negative.   Neurological: Negative.   Endo/Heme/Allergies: Negative.   Psychiatric/Behavioral: Negative.       Physical Exam: Vital signs show temperature of 98.  Pulse 60.  Blood pressure 146/74.  Weight is 144 pounds.  Wt Readings from Last 3 Encounters:  06/19/22 148 lb 12.8 oz (67.5 kg)  05/30/22 140 lb 1.9 oz (63.6 kg)  05/17/22 128 lb 9.6 oz (58.3 kg)    Physical Exam Vitals reviewed.  HENT:     Head: Normocephalic and atraumatic.  Eyes:     Pupils: Pupils are equal, round, and reactive to light.  Cardiovascular:     Rate and Rhythm: Normal rate and regular rhythm.     Heart sounds: Normal heart sounds.  Pulmonary:     Effort: Pulmonary effort is normal.     Breath sounds: Normal breath sounds.  Abdominal:     General:  Bowel sounds are normal.     Palpations: Abdomen is soft.  Musculoskeletal:        General: No tenderness or deformity. Normal range of motion.     Cervical back: Normal range of motion.  Lymphadenopathy:     Cervical: No cervical adenopathy.  Skin:    General: Skin is warm and dry.     Findings: No erythema or rash.  Neurological:     Mental Status: He is alert and oriented to person, place, and time.  Psychiatric:        Behavior: Behavior normal.        Thought Content: Thought content normal.  Judgment: Judgment normal.      Lab Results  Component Value Date   WBC 4.5 07/18/2022   HGB 11.0 (L) 07/18/2022   HCT 35.1 (L) 07/18/2022   MCV 88.4 07/18/2022   PLT 146 (L) 07/18/2022   Lab Results  Component Value Date   FERRITIN 202 06/20/2022   IRON 63 05/30/2022   TIBC 270 05/30/2022   UIBC 207 05/30/2022   IRONPCTSAT 23 05/30/2022   Lab Results  Component Value Date   RETICCTPCT 0.7 07/18/2022   RBC 3.97 (L) 07/18/2022   RBC 3.97 (L) 07/18/2022   No results found for: "KPAFRELGTCHN", "LAMBDASER", "KAPLAMBRATIO" No results found for: "IGGSERUM", "IGA", "IGMSERUM" Lab Results  Component Value Date   TOTALPROTELP 6.0 (L) 12/26/2015   ALBUMINELP 3.4 (L) 12/26/2015   A1GS 0.4 (H) 12/26/2015   A2GS 0.8 12/26/2015   BETS 0.4 12/26/2015   BETA2SER 0.3 12/26/2015   GAMS 0.8 12/26/2015   MSPIKE Not Observed 01/29/2016   SPEI SEE NOTE 12/26/2015     Chemistry      Component Value Date/Time   NA 140 07/18/2022 1159   NA 141 08/11/2017 0924   NA 145 04/09/2017 0905   NA 140 03/28/2016 0853   K 4.4 07/18/2022 1159   K 4.5 04/09/2017 0905   K 4.4 03/28/2016 0853   CL 105 07/18/2022 1159   CL 105 04/09/2017 0905   CO2 25 07/18/2022 1159   CO2 27 04/09/2017 0905   CO2 19 (L) 03/28/2016 0853   BUN 40 (H) 07/18/2022 1159   BUN 27 08/11/2017 0924   BUN 33 (H) 04/09/2017 0905   BUN 34.4 (H) 03/28/2016 0853   CREATININE 1.86 (H) 07/18/2022 1159   CREATININE  1.72 (H) 08/18/2017 1543   CREATININE 1.7 (H) 03/28/2016 0853   GLU 113 07/24/2016 0000      Component Value Date/Time   CALCIUM 9.1 07/18/2022 1159   CALCIUM 9.2 04/09/2017 0905   CALCIUM 9.2 03/28/2016 0853   ALKPHOS 98 07/18/2022 1159   ALKPHOS 69 04/09/2017 0905   ALKPHOS 85 03/28/2016 0853   AST 25 07/18/2022 1159   AST 23 03/28/2016 0853   ALT 15 07/18/2022 1159   ALT 27 04/09/2017 0905   ALT 37 03/28/2016 0853   BILITOT 0.5 07/18/2022 1159   BILITOT 0.79 03/28/2016 0853      Impression and Plan: Mr. Geissler is a very pleasant 89 yo caucasian gentleman with both erythropoietin deficiency and iron deficiency anemia.   He does not need any Aranesp today.  I am quite happy about this.  I do see him in charge.  He comes in when he can.  Otherwise, he is with his wife.  We will go ahead and plan to get him back in another 3 or 4 weeks.  I am sure he will need Aranesp at that time.   [name], MD 3/22/202412:47 PM

## 2022-07-22 ENCOUNTER — Ambulatory Visit (INDEPENDENT_AMBULATORY_CARE_PROVIDER_SITE_OTHER): Payer: Medicare Other | Admitting: Family Medicine

## 2022-07-22 VITALS — BP 140/70 | HR 60 | Wt 147.4 lb

## 2022-07-22 DIAGNOSIS — D631 Anemia in chronic kidney disease: Secondary | ICD-10-CM | POA: Diagnosis not present

## 2022-07-22 DIAGNOSIS — I480 Paroxysmal atrial fibrillation: Secondary | ICD-10-CM

## 2022-07-22 DIAGNOSIS — I5033 Acute on chronic diastolic (congestive) heart failure: Secondary | ICD-10-CM

## 2022-07-22 DIAGNOSIS — D6869 Other thrombophilia: Secondary | ICD-10-CM | POA: Diagnosis not present

## 2022-07-22 DIAGNOSIS — N1831 Chronic kidney disease, stage 3a: Secondary | ICD-10-CM

## 2022-07-22 DIAGNOSIS — I10 Essential (primary) hypertension: Secondary | ICD-10-CM

## 2022-07-22 DIAGNOSIS — I25708 Atherosclerosis of coronary artery bypass graft(s), unspecified, with other forms of angina pectoris: Secondary | ICD-10-CM | POA: Diagnosis not present

## 2022-07-22 DIAGNOSIS — D500 Iron deficiency anemia secondary to blood loss (chronic): Secondary | ICD-10-CM

## 2022-07-22 DIAGNOSIS — R7303 Prediabetes: Secondary | ICD-10-CM

## 2022-07-22 DIAGNOSIS — E559 Vitamin D deficiency, unspecified: Secondary | ICD-10-CM | POA: Diagnosis not present

## 2022-07-22 DIAGNOSIS — E7800 Pure hypercholesterolemia, unspecified: Secondary | ICD-10-CM | POA: Diagnosis not present

## 2022-07-22 LAB — POCT GLYCOSYLATED HEMOGLOBIN (HGB A1C)
HbA1c POC (<> result, manual entry): 6 % (ref 4.0–5.6)
HbA1c, POC (controlled diabetic range): 6 % (ref 0.0–7.0)
HbA1c, POC (prediabetic range): 6 % (ref 5.7–6.4)
Hemoglobin A1C: 6 % — AB (ref 4.0–5.6)

## 2022-07-22 MED ORDER — AMLODIPINE BESYLATE 5 MG PO TABS: 5.0000 mg | ORAL_TABLET | Freq: Every day | ORAL | 0 refills | Status: DC

## 2022-07-22 MED ORDER — CARVEDILOL 6.25 MG PO TABS: 6.2500 mg | ORAL_TABLET | Freq: Two times a day (BID) | ORAL | 1 refills | Status: DC

## 2022-07-22 MED ORDER — ATORVASTATIN CALCIUM 20 MG PO TABS: 20.0000 mg | ORAL_TABLET | Freq: Every day | ORAL | 3 refills | Status: DC

## 2022-07-22 MED ORDER — POTASSIUM CHLORIDE ER 10 MEQ PO TBCR: 10.0000 meq | EXTENDED_RELEASE_TABLET | Freq: Every day | ORAL | 1 refills | Status: DC

## 2022-07-22 NOTE — Progress Notes (Unsigned)
[name] , 06-Nov-1928, 89 y.o., male MRN: [id number] Patient Care Team    Relationship Specialty Notifications Start End  William Hillock, DO PCP - General Family Medicine  12/18/15   William Hector, MD PCP - Cardiology Cardiology  03/17/17   William Grayer, MD (Inactive) PCP - Electrophysiology Cardiology  06/26/21   William Drought, MD Consulting Physician Ophthalmology  12/18/15   William Miner, MD Consulting Physician Dermatology  12/18/15   William Hector, MD Consulting Physician Cardiology  12/18/15   William Stalls, MD Consulting Physician Ophthalmology  12/19/15   William Napoleon, MD Consulting Physician Oncology  09/30/16   William Agent, MD Attending Physician Nephrology  09/30/16   William Matin, MD Consulting Physician Dermatology  09/30/16   William Gallo, MD Consulting Physician Urology  06/30/19   [name], DPM Consulting Physician Podiatry  06/30/19     Chief Complaint  Patient presents with   Hypertension    Subjective: [name] is a 89 y.o. male present for [facility] Hypertension//A.Fib/IDA/chronic anticoagulation Patient reports compliance with  Coreg 6.25  twice a day and amlodipine 5 mg QD.  He has been needing to use his Lasix at 40 mg dose schedule daily now.     Patient denies chest pain, shortness of breath, dizziness or lower extremity edema.    Diet: Low sodium diet followed Exercise: very active RF: CKD3, HLD, CAD w/ CABG (1999), cardiac cath 2011 2/2 CP (all grafts normal), PVD, Afib Significant cardiac history of CAD with prior CABG in 1999 -LIMA to the LAD, SVG to IM, OM1, OM 2, SVG to D1, SVG to PDA/PLA, EF 45-50% with mild diffuse hypokinesis. Grafts patent by cath 2011   Nuclear stress test 08/2015 scar, no ischemia LVEF 70% also has atrial fibrillation/flutter in 2014 on Eliquis status post [facility] 10/2014, PVD, claudication.  Cardioversion 04/01/2017.  Pacemaker implant 07/28/2017.  mild RAE   BPH/cancer anterior wall of urinary bladder:  Managed by oncology Prediabetes:  He has been a prediabetic -controlled with diet alone Gout: Patient reports gout is well-controlled on allopurinol 150 mg daily.  Renally dosed CKD/vitamin D deficiency: Patient reports compliance 5000 units vitamin D daily. IBS: Patient reports his IBS is well-controlled-but he does have take the Bentyl as needed.  Allergies  Allergen Reactions   Lisinopril Other (See Comments)     he has difficulty controlling potassium on ace/arb- lisinopril was dc'd in the past 2/2 to hyperkalemia documented by [facility] PCP 01/2021   Penicillins Hives    Has patient had a PCN reaction causing immediate rash, facial/tongue/throat swelling, SOB or lightheadedness with hypotension: No Has patient had a PCN reaction causing severe rash involving mucus membranes or skin necrosis: Yes Has patient had a PCN reaction that required hospitalization: No Has patient had a PCN reaction occurring within the last 10 years: No If all of the above answers are "NO", then may proceed with Cephalosporin use.    Vancomycin Other (See Comments)    Edema and myalgia   Social History   Tobacco Use   Smoking status: Former   Smokeless tobacco: Former    Types: Chew    Quit date: 02/03/1969  Substance Use Topics   Alcohol use: No    Alcohol/week: 0.0 standard drinks of alcohol   Past Medical History:  Diagnosis Date   A-fib ([place])    Basal cell carcinoma    skin   Bigeminal rhythm    Bradycardia 06/21/2017  Cancer of anterior wall of urinary bladder ([place]) 07/18/2019   Cataract    Chronic renal insufficiency, stage 3 (moderate) ([place]) 2018   GFR 30s-40s   Coronary artery disease    post bypass   CVD (cardiovascular disease)    Diabetes mellitus without complication ([place])    type 2 diet controlled   Diverticular disease    Easy bruising    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   Gout    Hematuria 06/30/2019   Hernia    Hyperkalemia 05/06/2017   Hyperlipidemia     Hypertension    IBS (irritable bowel syndrome)    Iron deficiency anemia 02/04/2016   Macular degeneration    Microscopic colitis    PVD (peripheral vascular disease) ([place])    Past Surgical History:  Procedure Laterality Date   CARDIAC CATHETERIZATION  2006   CARDIOVERSION N/A 02/22/2013   Procedure: CARDIOVERSION;  Surgeon: William Hector, MD;  Location: [place];  Service: Cardiovascular;  Laterality: N/A;   CARDIOVERSION N/A 11/15/2014   Procedure: CARDIOVERSION;  Surgeon: William Hector, MD;  Location: [place];  Service: Cardiovascular;  Laterality: N/A;   CARDIOVERSION N/A 04/01/2017   Procedure: CARDIOVERSION;  Surgeon: Larey Dresser, MD;  Location: [place];  Service: Cardiovascular;  Laterality: N/A;   CAROTID ENDARTERECTOMY  2009/ 1993   left/ right   COLONOSCOPY  03/17/2005   The colon is normal.   CORONARY ARTERY BYPASS GRAFT  1999   CYSTOSCOPY W/ RETROGRADES Bilateral 07/14/2019   Procedure: CYSTOSCOPY WITH RETROGRADE PYELOGRAM;  Surgeon: William Gallo, MD;  Location: [facility];  Service: Urology;  Laterality: Bilateral;   ESOPHAGOGASTRODUODENOSCOPY  03/17/2005   Normal esophagus. Normal Stomanch. Normal duodenum.    EYE SURGERY     eyelid repair   INGUINAL HERNIA REPAIR  02/11/2012   Procedure: LAPAROSCOPIC BILATERAL INGUINAL HERNIA REPAIR;  Surgeon: Pedro Earls, MD;  [facility];  Service: General;  Laterality: Bilateral;   PACEMAKER IMPLANT N/A 07/28/2017   St Jude Medical Assurity MRI conditional  dual-chamber pacemaker for symptomatic second degree AV block by Dr Rayann Heman   SKIN CANCER EXCISION     right ear x 3   TRANSURETHRAL RESECTION OF BLADDER TUMOR N/A 09/05/2019   Procedure: TRANSURETHRAL RESECTION OF BLADDER TUMOR (TURBT);  Surgeon: William Gallo, MD;  Location: [facility];  Service: Urology;  Laterality: N/A;   TRANSURETHRAL RESECTION OF BLADDER TUMOR WITH MITOMYCIN-C N/A 07/14/2019   Procedure:  TRANSURETHRAL RESECTION OF BLADDER TUMOR WITH GEMCITABINE IN PACU;  Surgeon: William Gallo, MD;  Location[facility];  Service: Urology;  Laterality: N/A;   Family History  Problem Relation Age of Onset   Stroke Mother    Coronary artery disease Father    Heart disease Father    Melanoma Sister    Colon cancer Neg Hx    Allergies as of 07/22/2022       Reactions   Lisinopril Other (See Comments)    he has difficulty controlling potassium on ace/arb- lisinopril was dc'd in the past 2/2 to hyperkalemia documented by [facility] PCP 01/2021   Penicillins Hives   Has patient had a PCN reaction causing immediate rash, facial/tongue/throat swelling, SOB or lightheadedness with hypotension: No Has patient had a PCN reaction causing severe rash involving mucus membranes or skin necrosis: Yes Has patient had a PCN reaction that required hospitalization: No Has patient had a PCN reaction occurring within the last 10 years: No If all of the above  answers are "NO", then may proceed with Cephalosporin use.   Vancomycin Other (See Comments)   Edema and myalgia        Medication List        Accurate as of July 22, 2022 11:59 PM. If you have any questions, ask your nurse or doctor.          allopurinol 300 MG tablet Commonly known as: ZYLOPRIM Take 0.5 tablets (150 mg total) by mouth daily.   amLODipine 5 MG tablet Commonly known as: NORVASC Take 1 tablet (5 mg total) by mouth at bedtime.   apixaban 2.5 MG Tabs tablet Commonly known as: Eliquis Take 1 tablet (2.5 mg total) by mouth 2 (two) times daily.   atorvastatin 20 MG tablet Commonly known as: LIPITOR Take 1 tablet (20 mg total) by mouth at bedtime.   carvedilol 6.25 MG tablet Commonly known as: COREG Take 1 tablet (6.25 mg total) by mouth 2 (two) times daily with a meal.   Cholecalciferol 125 MCG (5000 UT) capsule Take 5,000 Units by mouth daily.   dicyclomine 10 MG capsule Commonly known as:  BENTYL Take 10 mg by mouth every 6 (six) hours as needed.   finasteride 5 MG tablet Commonly known as: PROSCAR Take 5 mg by mouth daily.   fish oil-omega-3 fatty acids 1000 MG capsule Take 1 g by mouth daily.   fluticasone 50 MCG/ACT nasal spray Commonly known as: FLONASE Place 2 sprays into both nostrils daily.   furosemide 40 MG tablet Commonly known as: LASIX Take 1 tablet (40 mg total) by mouth daily.   latanoprost 0.005 % ophthalmic solution Commonly known as: XALATAN Place 1 drop into both eyes at bedtime.   onetouch ultrasoft lancets Use as instructed   OneTouch Verio test strip Generic drug: glucose blood Use as instructed   potassium chloride 10 MEQ tablet Commonly known as: KLOR-CON Take 1 tablet (10 mEq total) by mouth daily.   PRESERVISION AREDS PO Take 1 tablet by mouth 2 (two) times daily.   PROBIOTIC PO Take 1 tablet by mouth daily.   tamsulosin 0.4 MG Caps capsule Commonly known as: FLOMAX Take 0.4 mg by mouth in the morning and at bedtime.   timolol 0.5 % ophthalmic solution Commonly known as: TIMOPTIC Place 1 drop into both eyes 2 (two) times daily.   UNABLE TO FIND Take 2 tablets by mouth every morning. Med Name: Ester Rink (takes place of Co Q 10)        No results found for this or any previous visit (from the past 24 hour(s)).  No results found.   ROS: Negative, with the exception of above mentioned in HPI  Objective:  BP (!) 140/70   Pulse 60   Wt 147 lb 6.4 oz (66.9 kg)   SpO2 97%   BMI 23.09 kg/m  Body mass index is 23.09 kg/m.  Physical Exam Vitals and nursing note reviewed.  Constitutional:      General: He is not in acute distress.    Appearance: Normal appearance. He is not ill-appearing, toxic-appearing or diaphoretic.  HENT:     Head: Normocephalic and atraumatic.     Mouth/Throat:     Mouth: Mucous membranes are moist.  Eyes:     General: No scleral icterus.       Right eye: No discharge.        Left eye: No  discharge.     Extraocular Movements: Extraocular movements intact.     Pupils: Pupils are  equal, round, and reactive to light.  Cardiovascular:     Rate and Rhythm: Normal rate and regular rhythm.  Pulmonary:     Effort: Pulmonary effort is normal. No respiratory distress.     Breath sounds: Normal breath sounds. No wheezing, rhonchi or rales.  Musculoskeletal:     Cervical back: Neck supple.     Right lower leg: No edema.     Left lower leg: No edema.  Lymphadenopathy:     Cervical: No cervical adenopathy.  Skin:    General: Skin is warm and dry.     Coloration: Skin is not jaundiced or pale.     Findings: No rash.  Neurological:     Mental Status: He is alert and oriented to person, place, and time. Mental status is at baseline.  Psychiatric:        Mood and Affect: Mood normal.        Behavior: Behavior normal.        Thought Content: Thought content normal.        Judgment: Judgment normal.    Results for orders placed or performed in visit on 07/22/22 (from the past 72 hour(s))  POCT HgB A1C     Status: Abnormal   Collection Time: 07/22/22  1:54 PM  Result Value Ref Range   Hemoglobin A1C 6.0 (A) 4.0 - 5.6 %   HbA1c POC (<> result, manual entry) 6.0 4.0 - 5.6 %   HbA1c, POC (prediabetic range) 6.0 5.7 - 6.4 %   HbA1c, POC (controlled diabetic range) 6.0 0.0 - 7.0 %    Assessment/Plan: [name] is a 89 y.o. male present for OV for  Essential hypertension/Paroxysmal atrial fibrillation (HCC)/Stage 3 chronic kidney disease/HLD//CHF/CAD/PVD Stable Continue amlodipine 5 mg daily Continue coreg 6.25 mg twice daily. Continue Eliquis>cardio Lasix 20-40 mg daily  - continue  fish oil supplementation -  he  has difficulty controlling potassium on ace/arb-  lisinopril was dc'd in the past 2/2 to hyperkalemia - now he is on a potasium supplement daily to maintain level> continue K-Dur 10 mEq daily. - Low-sodium diet.  -  He BP has been  very difficult to control at  times and his hydration and IDA/epo injections cause fluctuations leading to both highs and lower than desired readings. Certainly would rather mildly elevated BP over too low. Labs up-to-date - Follow-up 5.5 months, as long as seeing Dr. Johnsie Cancel routinely as well.  Anemias IDA and EPO:  - managed by heme/onc.  Patient reports he is feeling pretty good  Prediabetes Patient has been very well diet controlled 5.7> 6.4> 6.5>5.9>6.5> 6.0> 5.8 >6.0 A1c collected today  Continue dietary modifications and exercise.  Gout, unspecified cause, unspecified chronicity, unspecified site Able Continue allopurinol Continue renally dosed allopurinol  Hematuria/BPH/bladder cancer: Patient reports his bladder cancer reported responded well to treatment.  He is prescribed Flomax and Proscar by urology.  CKD 4: Reviewed BMP collected last week by specialty team> GFR < 30 Avoid NSAIDs Renally dose medications PTH/calcium and vitamin D UTD     Meds managed by primary team: [facility]: Lasix, potassium, Bentyl [facility] scripts: Amlodipine, Coreg, Lipitor, allopurinol  Orders Placed This Encounter  Procedures   POCT HgB A1C   Meds ordered this encounter  Medications   amLODipine (NORVASC) 5 MG tablet    Sig: Take 1 tablet (5 mg total) by mouth at bedtime.    Dispense:  30 tablet    Refill:  0    Needs OV for  refills   atorvastatin (LIPITOR) 20 MG tablet    Sig: Take 1 tablet (20 mg total) by mouth at bedtime.    Dispense:  90 tablet    Refill:  3   carvedilol (COREG) 6.25 MG tablet    Sig: Take 1 tablet (6.25 mg total) by mouth 2 (two) times daily with a meal.    Dispense:  180 tablet    Refill:  1   potassium chloride (KLOR-CON) 10 MEQ tablet    Sig: Take 1 tablet (10 mEq total) by mouth daily.    Dispense:  90 tablet    Refill:  1    Referral Orders  No referral(s) requested today     electronically signed by:  Howard Pouch, DO  [place]

## 2022-07-22 NOTE — Patient Instructions (Addendum)
Return in about 24 weeks (around 01/06/2023).        Great to see you today.  I have refilled the medication(s) we provide.   If labs were collected, we will inform you of lab results once received either by echart message or telephone call.   - echart message- for normal results that have been seen by the patient already.   - telephone call: abnormal results or if patient has not viewed results in their echart.

## 2022-07-24 ENCOUNTER — Inpatient Hospital Stay: Payer: Medicare Other

## 2022-07-24 VITALS — BP 144/57 | HR 59 | Temp 97.9°F | Resp 16

## 2022-07-24 DIAGNOSIS — I482 Chronic atrial fibrillation, unspecified: Secondary | ICD-10-CM | POA: Diagnosis not present

## 2022-07-24 DIAGNOSIS — D509 Iron deficiency anemia, unspecified: Secondary | ICD-10-CM | POA: Diagnosis not present

## 2022-07-24 DIAGNOSIS — D631 Anemia in chronic kidney disease: Secondary | ICD-10-CM | POA: Diagnosis not present

## 2022-07-24 DIAGNOSIS — D508 Other iron deficiency anemias: Secondary | ICD-10-CM

## 2022-07-24 DIAGNOSIS — M549 Dorsalgia, unspecified: Secondary | ICD-10-CM | POA: Diagnosis not present

## 2022-07-24 DIAGNOSIS — N183 Chronic kidney disease, stage 3 unspecified: Secondary | ICD-10-CM | POA: Diagnosis not present

## 2022-07-24 DIAGNOSIS — I509 Heart failure, unspecified: Secondary | ICD-10-CM | POA: Diagnosis not present

## 2022-07-24 MED ORDER — SODIUM CHLORIDE 0.9 % IV SOLN: INTRAVENOUS | Status: DC

## 2022-07-24 MED ORDER — SODIUM CHLORIDE 0.9 % IV SOLN
510.0000 mg | Freq: Once | INTRAVENOUS | Status: AC
  Administered 2022-07-24: 510 mg via INTRAVENOUS
  Filled 2022-07-24: qty 510

## 2022-07-24 NOTE — Patient Instructions (Signed)

## 2022-07-28 NOTE — Progress Notes (Signed)
Office Visit    Patient Name: William SituRoger D Campoverde Date of Encounter: 08/01/2022  PCP:  Natalia LeatherwoodKuneff, Renee A, DO   [facility]  Cardiologist:  Charlton HawsPeter Larua Collier, MD  Advanced Practice Provider:  No care team member to display  HPI    [name] is a 89 y.o. male with a past medical history of CAD status post CABG in 1999 (LIMA to LAD, SVG to IM, sequential SVG to OM1, OM 2, SVG to D1, SVG to PDA), [facility], PAF, anemia on aranesp and iron infusions chronically presents today for follow-up appointment.  He was seen by Dr. Cyndie ChimeNguyen 05/2021 and at that visit was doing well.  No complaints of shortness of breath or chest pain.  TTE was completed 11/20/2020 with EF 40 to 45% and severe biatrial enlargement with moderate MR.  Shared decision given patient's age and renal failure that cardiac cath would not be pursued.  He was admitted 09/16/2021 for heart failure exacerbation.  Presented with shortness of breath and was admitted for diuresis with a chest x-ray noted small effusion, pulmonary vascular congestion, BNP 1700, creatinine was 2.4.  Lost 6.2 L.  Discharged in stable condition after 2 days with oral prescription 40 mg of Lasix as needed.  No further medication changes made at that time.  He was last seen by Robin SearingErnest Dick, NP 10/2021.  He had a hospital visit in May doing better   Hospital 1/15 through 05/17/2022 and had 7 L of fluid pulled off of him.  He was on IV Lasix.  He was admitted for CHF exacerbation.  He is on daily Lasix 40 mg, can take an extra 40 mg if he weighs over 3 pounds in 1 night or 5 pounds in a week.  He is only had to do this twice since admission.  He weighs every morning and today weight 144.8 pounds at home on his scale.  He does request he can stay away from salt.  He states that the vein was taken out of his right leg for his CABG back in 1999 so that likely was small is a little bit more than the other.  We discussed elastic  therapy in [place] for compression.  Reports no shortness of breath nor dyspnea on exertion. Reports no chest pain, pressure, or tightness. No orthopnea, PND. Reports no palpitations.   Sees Dr Myna HidalgoEnnever for anemia and had iron infusion this month. Sees renal and Cr stable Seeing [place] derm for basal cell on top of his head that    Past Medical History    Past Medical History:  Diagnosis Date   A-fib (HCC)    Basal cell carcinoma    skin   Bigeminal rhythm    Bradycardia 06/21/2017   Cancer of anterior wall of urinary bladder (HCC) 07/18/2019   Cataract    Chronic renal insufficiency, stage 3 (moderate) (HCC) 2018   GFR 30s-40s   Coronary artery disease    post bypass   CVD (cardiovascular disease)    Diabetes mellitus without complication (HCC)    type 2 diet controlled   Diverticular disease    Easy bruising    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   Gout    Hematuria 06/30/2019   Hernia    Hyperkalemia 05/06/2017   Hyperlipidemia    Hypertension    IBS (irritable bowel syndrome)    Iron deficiency anemia 02/04/2016   Macular degeneration    Microscopic  colitis    PVD (peripheral vascular disease) [facility](HCC)    Past Surgical History:  Procedure Laterality Date   CARDIAC CATHETERIZATION  2006   CARDIOVERSION N/A 02/22/2013   Procedure: CARDIOVERSION;  Surgeon: Wendall StadePeter C Tallon Gertz, MD;  [facility];  Service: Cardiovascular;  Laterality: N/A;   CARDIOVERSION N/A 11/15/2014   Procedure: CARDIOVERSION;  Surgeon: Wendall StadePeter C Tymel Conely, MD;  [facility];  Service: Cardiovascular;  Laterality: N/A;   CARDIOVERSION N/A 04/01/2017   Procedure: CARDIOVERSION;  Surgeon: Laurey MoraleMcLean, Dalton S, MD;  [facility];  Service: Cardiovascular;  Laterality: N/A;   CAROTID ENDARTERECTOMY  2009/ 1993   left/ right   COLONOSCOPY  03/17/2005   The colon is normal.   CORONARY ARTERY BYPASS GRAFT  1999   CYSTOSCOPY W/ RETROGRADES Bilateral 07/14/2019   Procedure: CYSTOSCOPY  WITH RETROGRADE PYELOGRAM;  Surgeon: Marcine Matarahlstedt, Stephen, MD;  [facility] [place];  Service: Urology;  Laterality: Bilateral;   ESOPHAGOGASTRODUODENOSCOPY  03/17/2005   Normal esophagus. Normal Stomanch. Normal duodenum.    EYE SURGERY     eyelid repair   INGUINAL HERNIA REPAIR  02/11/2012   Procedure: LAPAROSCOPIC BILATERAL INGUINAL HERNIA REPAIR;  Surgeon: Valarie MerinoMatthew B Martin, MD;  [facility];  Service: General;  Laterality: Bilateral;   PACEMAKER IMPLANT N/A 07/28/2017   St Jude Medical Assurity MRI conditional  dual-chamber pacemaker for symptomatic second degree AV block by Dr Johney FrameAllred   SKIN CANCER EXCISION     right ear x 3   TRANSURETHRAL RESECTION OF BLADDER TUMOR N/A 09/05/2019   Procedure: TRANSURETHRAL RESECTION OF BLADDER TUMOR (TURBT);  Surgeon: Marcine Matarahlstedt, Stephen, MD;  [facility] [place];  Service: Urology;  Laterality: N/A;   TRANSURETHRAL RESECTION OF BLADDER TUMOR WITH MITOMYCIN-C N/A 07/14/2019   Procedure: TRANSURETHRAL RESECTION OF BLADDER TUMOR WITH GEMCITABINE IN PACU;  Surgeon: Marcine Matarahlstedt, Stephen, MD;  [facility] [place];  Service: Urology;  Laterality: N/A;    Allergies  Allergies  Allergen Reactions   Lisinopril Other (See Comments)     he has difficulty controlling potassium on ace/arb- lisinopril was dc'd in the past 2/2 to hyperkalemia documented by [facility] P[facility] 01/2021   Penicillins Hives    Has patient had a PCN reaction causing immediate rash, facial/tongue/throat swelling, SOB or lightheadedness with hypotension: No Has patient had a PCN reaction causing severe rash involving mucus membranes or skin necrosis: Yes Has patient had a PCN reaction that required hospitalization: No Has patient had a PCN reaction occurring within the last 10 years: No If all of the above answers are "NO", then may proceed with Cephalosporin use.    Vancomycin Other (See Comments)    Edema and myalgia   EKGs/Labs/Other  Studies Reviewed:   The following studies were reviewed today:  Echocardiogram 08/2021 IMPRESSIONS     1. Left ventricular ejection fraction, by estimation, is 40 to 45%. The  left ventricle has mildly decreased function. The left ventricle  demonstrates global hypokinesis with septal-lateral dyssynchrony  consistent with RV pacing. There is mild left  ventricular hypertrophy. Left ventricular diastolic parameters are  indeterminate.   2. Right ventricular systolic function is mildly reduced. The right  ventricular size is normal. There is moderately elevated pulmonary artery  systolic pressure. The estimated right ventricular systolic pressure is  47.9 mmHg.   3. Left atrial size was moderately dilated.   4. Right atrial size was mildly dilated.   5. The mitral valve is normal in structure. Mild mitral valve  regurgitation.  No evidence of mitral stenosis.   6. The aortic valve is tricuspid. Aortic valve regurgitation is trivial.  No aortic stenosis is present.   7. The inferior vena cava is dilated in size with <50% respiratory  variability, suggesting right atrial pressure of 15 mmHg.   EKG:  EKG is not ordered today.    Recent Labs: 08/23/2021: Pro B Natriuretic peptide (BNP) 1,629.0 05/12/2022: B Natriuretic Peptide 1,574.9 07/18/2022: ALT 15; BUN 40; Creatinine 1.86; Hemoglobin 11.0; Platelet Count 146; Potassium 4.4; Sodium 140  Recent Lipid Panel    Component Value Date/Time   CHOL 125 11/24/2019 1138   TRIG 67 11/24/2019 1138   HDL 57 11/24/2019 1138   CHOLHDL 2.2 11/24/2019 1138   VLDL 9 06/21/2017 0420   LDLCALC 54 11/24/2019 1138   LDLDIRECT 49 02/11/2021 1433     Home Medications   No outpatient medications have been marked as taking for the 08/01/22 encounter (Appointment) with Wendall Stade, MD.     Review of Systems      All other systems reviewed and are otherwise negative except as noted above.  Physical Exam    VS:  There were no vitals taken for  this visit. , BMI There is no height or weight on file to calculate BMI.  Wt Readings from Last 3 Encounters:  07/22/22 147 lb 6.4 oz (66.9 kg)  07/18/22 144 lb 6.4 oz (65.5 kg)  06/19/22 148 lb 12.8 oz (67.5 kg)    Affect appropriate Elderly male  HEENT: normal Neck supple with no adenopathy JVP normal no bruits no thyromegaly Lungs clear with no wheezing and good diaphragmatic motion Heart:  S1/S2 no murmur, no rub, gallop or click PMI normal  PPM under left clavicile  Abdomen: benighn, BS positve, no tenderness, no AAA no bruit.  No HSM or HJR Distal pulses intact with no bruits No edema Neuro non-focal Large RAW basal cell on apex of scalp  No muscular weakness   Assessment & Plan    CAD status post CABG -continue statin ASA and beta blocker  -no chest pain or increased SOB - given age and lack of chest pain no indication for stress testing   Carotid artery disease -left bruit on exam -duplex 07/01/22 plaque no stenosis   Atrial fibrillation on chronic anticoagulation -Continue Eliquis 2.5 mg twice a day and coreg -He is in rate controlled A-fib today  CHF with recent exacerbation -Mild lower extremity edema today, significantly better -Dry weight appears to be 142 pounds -Continue Lasix 40 daily and an extra 40 mg if lower extremity edema or gain more than 3 pounds overnight or 5 pounds in a week -Daily weights Weight in office fully clothed 144.8 lbs -Follow-up with nephrology, treatment somewhat limited due to creatinine 1.86  -Low-sodium diet - TTE 09/18/21 EF 40-45% mild MR will update  PPM -no change, stable  6.  Anemia:  f/u Dr Myna Hidalgo erythropoietin and iron as needed for Hb < 11  7.  CRF:  Baseline Cr 1.8 Check today f/u Nephrology  8. Derm:  large basal cell on scalp seeing Mohs surgeon at [facility]   TTE BMET/BNP   F/U in 6 months   Signed, Charlton Haws, MD 08/01/2022, 1:27 PM [facility]

## 2022-07-29 ENCOUNTER — Ambulatory Visit (INDEPENDENT_AMBULATORY_CARE_PROVIDER_SITE_OTHER): Payer: Medicare Other

## 2022-07-29 DIAGNOSIS — I442 Atrioventricular block, complete: Secondary | ICD-10-CM | POA: Diagnosis not present

## 2022-07-29 LAB — CUP PACEART REMOTE DEVICE CHECK
Battery Remaining Longevity: 59 mo
Battery Remaining Percentage: 52 %
Battery Voltage: 2.98 V
Brady Statistic RV Percent Paced: 99 %
Date Time Interrogation Session: 20240402021838
Implantable Lead Connection Status: 753985
Implantable Lead Connection Status: 753985
Implantable Lead Implant Date: 20190402
Implantable Lead Implant Date: 20190402
Implantable Lead Location: 753859
Implantable Lead Location: 753860
Implantable Pulse Generator Implant Date: 20190402
Lead Channel Impedance Value: 410 Ohm
Lead Channel Pacing Threshold Amplitude: 0.5 V
Lead Channel Pacing Threshold Pulse Width: 0.5 ms
Lead Channel Sensing Intrinsic Amplitude: 12 mV
Lead Channel Setting Pacing Amplitude: 2.5 V
Lead Channel Setting Pacing Pulse Width: 0.5 ms
Lead Channel Setting Sensing Sensitivity: 2 mV
Pulse Gen Model: 2272
Pulse Gen Serial Number: 9003454

## 2022-08-01 ENCOUNTER — Encounter: Payer: Self-pay | Admitting: Cardiovascular Disease

## 2022-08-01 ENCOUNTER — Ambulatory Visit: Payer: Medicare Other | Attending: Cardiovascular Disease | Admitting: Cardiovascular Disease

## 2022-08-01 VITALS — BP 154/60 | HR 65 | Ht 67.0 in | Wt 144.8 lb

## 2022-08-01 DIAGNOSIS — I5032 Chronic diastolic (congestive) heart failure: Secondary | ICD-10-CM

## 2022-08-01 DIAGNOSIS — I2510 Atherosclerotic heart disease of native coronary artery without angina pectoris: Secondary | ICD-10-CM

## 2022-08-01 DIAGNOSIS — I340 Nonrheumatic mitral (valve) insufficiency: Secondary | ICD-10-CM | POA: Diagnosis not present

## 2022-08-01 DIAGNOSIS — I779 Disorder of arteries and arterioles, unspecified: Secondary | ICD-10-CM | POA: Diagnosis not present

## 2022-08-01 NOTE — Patient Instructions (Addendum)
Medication Instructions:  Your physician recommends that you continue on your current medications as directed. Please refer to the Current Medication list given to you today.  *If you need a refill on your cardiac medications before your next appointment, please call your pharmacy*  Lab Work: BMET, BNP today If you have labs (blood work) drawn today and your tests are completely normal, you will receive your results only by: MyChart Message (if you have MyChart) OR A paper copy in the mail If you have any lab test that is abnormal or we need to change your treatment, we will call you to review the results.  Testing/Procedures: ECHO Your physician has requested that you have an echocardiogram. Echocardiography is a painless test that uses sound waves to create images of your heart. It provides your doctor with information about the size and shape of your heart and how well your heart's chambers and valves are working. This procedure takes approximately one hour. There are no restrictions for this procedure. Please do NOT wear cologne, perfume, aftershave, or lotions (deodorant is allowed). Please arrive 15 minutes prior to your appointment time.  Follow-Up: At [facility], you and your health needs are our priority.  As part of our continuing mission to provide you with exceptional heart care, we have created designated Provider Care Teams.  These Care Teams include your primary Cardiologist (physician) and Advanced Practice Providers (APPs -  Physician Assistants and Nurse Practitioners) who all work together to provide you with the care you need, when you need it.  We recommend signing up for the patient portal called "MyChart".  Sign up information is provided on this After Visit Summary.  MyChart is used to connect with patients for Virtual Visits (Telemedicine).  Patients are able to view lab/test results, encounter notes, upcoming appointments, etc.  Non-urgent messages can be  sent to your provider as well.   To learn more about what you can do with MyChart, go to [email or web address].    Your next appointment:   6 months  Provider:   Charlton Haws, MD

## 2022-08-02 LAB — PRO B NATRIURETIC PEPTIDE: NT-Pro BNP: 6451 pg/mL — ABNORMAL HIGH (ref 0–486)

## 2022-08-02 LAB — BASIC METABOLIC PANEL
BUN/Creatinine Ratio: 19 (ref 10–24)
BUN: 31 mg/dL (ref 10–36)
CO2: 19 mmol/L — ABNORMAL LOW (ref 20–29)
Calcium: 8.4 mg/dL — ABNORMAL LOW (ref 8.6–10.2)
Chloride: 105 mmol/L (ref 96–106)
Creatinine, Ser: 1.61 mg/dL — ABNORMAL HIGH (ref 0.76–1.27)
Glucose: 153 mg/dL — ABNORMAL HIGH (ref 70–99)
Potassium: 4 mmol/L (ref 3.5–5.2)
Sodium: 141 mmol/L (ref 134–144)
eGFR: 40 mL/min/{1.73_m2} — ABNORMAL LOW (ref >59)

## 2022-08-04 ENCOUNTER — Telehealth: Payer: Self-pay | Admitting: Cardiovascular Disease

## 2022-08-04 DIAGNOSIS — Z79899 Other long term (current) drug therapy: Secondary | ICD-10-CM

## 2022-08-04 MED ORDER — FUROSEMIDE 40 MG PO TABS: ORAL_TABLET | ORAL | 3 refills | Status: DC

## 2022-08-04 NOTE — Telephone Encounter (Signed)
Called and spoke to patient directly who verbalized understanding to increase Furosemide by taking an extra 1/2 tablet (20mg ) each evening due to increased BNP. Repeat labs entered and scheduled for 09/29/22

## 2022-08-04 NOTE — Telephone Encounter (Signed)
-----   Message from Wendall Stade, MD sent at 08/04/2022 10:21 AM EDT ----- Cr stable BNP up have him take lasix 40 mg am and 20 mg PM recheck BMET/BNP in 8 weeks Was not volume overloaded during viist

## 2022-08-18 DIAGNOSIS — D034 Melanoma in situ of scalp and neck: Secondary | ICD-10-CM | POA: Diagnosis not present

## 2022-08-18 DIAGNOSIS — L988 Other specified disorders of the skin and subcutaneous tissue: Secondary | ICD-10-CM | POA: Diagnosis not present

## 2022-08-18 DIAGNOSIS — Z85828 Personal history of other malignant neoplasm of skin: Secondary | ICD-10-CM | POA: Diagnosis not present

## 2022-08-18 DIAGNOSIS — C434 Malignant melanoma of scalp and neck: Secondary | ICD-10-CM | POA: Diagnosis not present

## 2022-08-19 DIAGNOSIS — D034 Melanoma in situ of scalp and neck: Secondary | ICD-10-CM | POA: Diagnosis not present

## 2022-08-19 DIAGNOSIS — C434 Malignant melanoma of scalp and neck: Secondary | ICD-10-CM | POA: Diagnosis not present

## 2022-08-20 DIAGNOSIS — L988 Other specified disorders of the skin and subcutaneous tissue: Secondary | ICD-10-CM | POA: Diagnosis not present

## 2022-08-20 DIAGNOSIS — C434 Malignant melanoma of scalp and neck: Secondary | ICD-10-CM | POA: Diagnosis not present

## 2022-08-26 ENCOUNTER — Inpatient Hospital Stay (HOSPITAL_BASED_OUTPATIENT_CLINIC_OR_DEPARTMENT_OTHER): Payer: Medicare Other | Admitting: Family

## 2022-08-26 ENCOUNTER — Inpatient Hospital Stay: Payer: Medicare Other | Attending: Hematology & Oncology

## 2022-08-26 ENCOUNTER — Encounter: Payer: Self-pay | Admitting: Family

## 2022-08-26 ENCOUNTER — Inpatient Hospital Stay: Payer: Medicare Other

## 2022-08-26 ENCOUNTER — Ambulatory Visit (HOSPITAL_BASED_OUTPATIENT_CLINIC_OR_DEPARTMENT_OTHER): Payer: Medicare Other

## 2022-08-26 VITALS — BP 137/51 | HR 82 | Temp 97.6°F | Resp 18 | Wt 139.0 lb

## 2022-08-26 DIAGNOSIS — I2510 Atherosclerotic heart disease of native coronary artery without angina pectoris: Secondary | ICD-10-CM | POA: Insufficient documentation

## 2022-08-26 DIAGNOSIS — I340 Nonrheumatic mitral (valve) insufficiency: Secondary | ICD-10-CM | POA: Insufficient documentation

## 2022-08-26 DIAGNOSIS — I779 Disorder of arteries and arterioles, unspecified: Secondary | ICD-10-CM

## 2022-08-26 DIAGNOSIS — D508 Other iron deficiency anemias: Secondary | ICD-10-CM

## 2022-08-26 DIAGNOSIS — D509 Iron deficiency anemia, unspecified: Secondary | ICD-10-CM

## 2022-08-26 DIAGNOSIS — N184 Chronic kidney disease, stage 4 (severe): Secondary | ICD-10-CM | POA: Insufficient documentation

## 2022-08-26 DIAGNOSIS — I5032 Chronic diastolic (congestive) heart failure: Secondary | ICD-10-CM | POA: Insufficient documentation

## 2022-08-26 DIAGNOSIS — D631 Anemia in chronic kidney disease: Secondary | ICD-10-CM | POA: Diagnosis not present

## 2022-08-26 DIAGNOSIS — N183 Chronic kidney disease, stage 3 unspecified: Secondary | ICD-10-CM | POA: Diagnosis not present

## 2022-08-26 LAB — CMP (CANCER CENTER ONLY)
ALT: 19 U/L (ref 0–44)
AST: 26 U/L (ref 15–41)
Albumin: 4.1 g/dL (ref 3.5–5.0)
Alkaline Phosphatase: 102 U/L (ref 38–126)
Anion gap: 6 (ref 5–15)
BUN: 71 mg/dL — ABNORMAL HIGH (ref 8–23)
CO2: 26 mmol/L (ref 22–32)
Calcium: 9.4 mg/dL (ref 8.9–10.3)
Chloride: 103 mmol/L (ref 98–111)
Creatinine: 2.49 mg/dL — ABNORMAL HIGH (ref 0.61–1.24)
GFR, Estimated: 24 mL/min — ABNORMAL LOW (ref >60)
Glucose, Bld: 106 mg/dL — ABNORMAL HIGH (ref 70–99)
Potassium: 4.6 mmol/L (ref 3.5–5.1)
Sodium: 135 mmol/L (ref 135–145)
Total Bilirubin: 0.6 mg/dL (ref 0.3–1.2)
Total Protein: 7.8 g/dL (ref 6.5–8.1)

## 2022-08-26 LAB — CBC WITH DIFFERENTIAL (CANCER CENTER ONLY)
Abs Immature Granulocytes: 0.03 10*3/uL (ref 0.00–0.07)
Basophils Absolute: 0 10*3/uL (ref 0.0–0.1)
Basophils Relative: 0 %
Eosinophils Absolute: 0.2 10*3/uL (ref 0.0–0.5)
Eosinophils Relative: 3 %
HCT: 32.8 % — ABNORMAL LOW (ref 39.0–52.0)
Hemoglobin: 10.6 g/dL — ABNORMAL LOW (ref 13.0–17.0)
Immature Granulocytes: 1 %
Lymphocytes Relative: 22 %
Lymphs Abs: 1.4 10*3/uL (ref 0.7–4.0)
MCH: 28.3 pg (ref 26.0–34.0)
MCHC: 32.3 g/dL (ref 30.0–36.0)
MCV: 87.7 fL (ref 80.0–100.0)
Monocytes Absolute: 0.6 10*3/uL (ref 0.1–1.0)
Monocytes Relative: 9 %
Neutro Abs: 4.2 10*3/uL (ref 1.7–7.7)
Neutrophils Relative %: 65 %
Platelet Count: 123 10*3/uL — ABNORMAL LOW (ref 150–400)
RBC: 3.74 MIL/uL — ABNORMAL LOW (ref 4.22–5.81)
RDW: 19.3 % — ABNORMAL HIGH (ref 11.5–15.5)
WBC Count: 6.3 10*3/uL (ref 4.0–10.5)
nRBC: 0 % (ref 0.0–0.2)

## 2022-08-26 LAB — RETICULOCYTES
Immature Retic Fract: 11.8 % (ref 2.3–15.9)
RBC.: 3.7 MIL/uL — ABNORMAL LOW (ref 4.22–5.81)
Retic Count, Absolute: 55.9 10*3/uL (ref 19.0–186.0)
Retic Ct Pct: 1.5 % (ref 0.4–3.1)

## 2022-08-26 LAB — IRON AND IRON BINDING CAPACITY (CC-WL,HP ONLY)
Iron: 110 ug/dL (ref 45–182)
Saturation Ratios: 41 % — ABNORMAL HIGH (ref 17.9–39.5)
TIBC: 272 ug/dL (ref 250–450)
UIBC: 162 ug/dL (ref 117–376)

## 2022-08-26 LAB — FERRITIN: Ferritin: 710 ng/mL — ABNORMAL HIGH (ref 24–336)

## 2022-08-26 MED ORDER — DARBEPOETIN ALFA 300 MCG/0.6ML IJ SOSY
300.0000 ug | PREFILLED_SYRINGE | Freq: Once | INTRAMUSCULAR | Status: AC
  Administered 2022-08-26: 300 ug via SUBCUTANEOUS
  Filled 2022-08-26: qty 0.6

## 2022-08-26 NOTE — Progress Notes (Signed)
Hematology and Oncology Follow Up Visit  [name] [id number] 06/28/1928 89 y.o. 08/26/2022   Principle Diagnosis:  Erythropoietin deficiency anemia Iron deficiency anemia Chronic atrial fibrillation   Current Therapy:        IV iron as indicated Aranesp 300 g sq prn hemoglobin < 11 Eliquis 2.5 mg PO twice daily   Interim History:  [name] is here for follow-up and injection. Hgb today is 10.6, MCV 87.  No issue with blood loss. No bruising or petechiae.  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 He states that they were able to finally remove it all and dressing is clean, dry and intact at this time.  He follows up with dermatology in 1 week.  No fever, chills, n/v, cough, rash, dizziness, SOB, chest pain, palpitations, abdominal pain or changes in bowel or bladder habits.  No swelling, tenderness, numbness or tingling in his extremities.  He had a fall 2 weeks ago but thankfully only ended up with a skinned right elbow. No syncope.  Appetite and hydration are good. Weight is stable at 139 lbs.   ECOG Performance Status: 2 - Symptomatic, <50% confined to bed  Medications:  Allergies as of 08/26/2022       Reactions   Lisinopril Other (See Comments)    he has difficulty controlling potassium on ace/arb- lisinopril was dc'd in the past 2/2 to hyperkalemia documented by [facility] P[facility] 01/2021   Penicillins Hives   Has patient had a PCN reaction causing immediate rash, facial/tongue/throat swelling, SOB or lightheadedness with hypotension: No Has patient had a PCN reaction causing severe rash involving mucus membranes or skin necrosis: Yes Has patient had a PCN reaction that required hospitalization: No Has patient had a PCN reaction occurring within the last 10 years: No If all of the above answers are "NO", then may proceed with Cephalosporin use.   Vancomycin Other (See Comments)   Edema and myalgia         Medication List        Accurate as of August 26, 2022 12:13 PM. If you have any questions, ask your nurse or doctor.          allopurinol 300 MG tablet Commonly known as: ZYLOPRIM Take 0.5 tablets (150 mg total) by mouth daily.   amLODipine 5 MG tablet Commonly known as: NORVASC Take 1 tablet (5 mg total) by mouth at bedtime.   apixaban 2.5 MG Tabs tablet Commonly known as: Eliquis Take 1 tablet (2.5 mg total) by mouth 2 (two) times daily.   atorvastatin 20 MG tablet Commonly known as: LIPITOR Take 1 tablet (20 mg total) by mouth at bedtime.   carvedilol 6.25 MG tablet Commonly known as: COREG Take 1 tablet (6.25 mg total) by mouth 2 (two) times daily with a meal.   Cholecalciferol 125 MCG (5000 UT) capsule Take 5,000 Units by mouth daily.   dicyclomine 10 MG capsule Commonly known as: BENTYL Take 10 mg by mouth every 6 (six) hours as needed.   finasteride 5 MG tablet Commonly known as: PROSCAR Take 5 mg by mouth daily.   fish oil-omega-3 fatty acids 1000 MG capsule Take 1 g by mouth daily.   fluticasone 50 MCG/ACT nasal spray Commonly known as: FLONASE Place 2 sprays into both nostrils daily.   furosemide 40 MG tablet Commonly known as: LASIX Take 1 tablet in the morning and 1/2 tablet in the  evening   latanoprost 0.005 % ophthalmic solution Commonly known as: XALATAN Place 1 drop into both eyes at bedtime.   onetouch ultrasoft lancets Use as instructed   OneTouch Verio test strip Generic drug: glucose blood Use as instructed   potassium chloride 10 MEQ tablet Commonly known as: KLOR-CON Take 1 tablet (10 mEq total) by mouth daily.   PRESERVISION AREDS PO Take 1 tablet by mouth 2 (two) times daily.   PROBIOTIC PO Take 1 tablet by mouth daily.   tamsulosin 0.4 MG Caps capsule Commonly known as: FLOMAX Take 0.4 mg by mouth in the morning and at bedtime.   timolol 0.5 % ophthalmic solution Commonly known as: TIMOPTIC Place 1 drop into both  eyes 2 (two) times daily.   UNABLE TO FIND Take 2 tablets by mouth every morning. Med Name: [name] (takes place of Co Q 10)        Allergies:  Allergies  Allergen Reactions   Lisinopril Other (See Comments)     he has difficulty controlling potassium on ace/arb- lisinopril was dc'd in the past 2/2 to hyperkalemia documented by [facility] 01/2021   Penicillins Hives    Has patient had a PCN reaction causing immediate rash, facial/tongue/throat swelling, SOB or lightheadedness with hypotension: No Has patient had a PCN reaction causing severe rash involving mucus membranes or skin necrosis: Yes Has patient had a PCN reaction that required hospitalization: No Has patient had a PCN reaction occurring within the last 10 years: No If all of the above answers are "NO", then may proceed with Cephalosporin use.    Vancomycin Other (See Comments)    Edema and myalgia    Past Medical History, Surgical history, Social history, and Family History were reviewed and updated.  Review of Systems: All other 10 point review of systems is negative.   Physical Exam:  vitals were not taken for this visit.   Wt Readings from Last 3 Encounters:  08/01/22 144 lb 12.8 oz (65.7 kg)  07/22/22 147 lb 6.4 oz (66.9 kg)  07/18/22 144 lb 6.4 oz (65.5 kg)    Ocular: Sclerae unicteric, pupils equal, round and reactive to light Ear-nose-throat: Oropharynx clear, dentition fair Lymphatic: No cervical or supraclavicular adenopathy Lungs no rales or rhonchi, good excursion bilaterally Heart regular rate and rhythm, no murmur appreciated Abd soft, nontender, positive bowel sounds MSK no focal spinal tenderness, no joint edema Neuro: non-focal, well-oriented, appropriate affect Breasts: Deferred   Lab Results  Component Value Date   WBC 6.3 08/26/2022   HGB 10.6 (L) 08/26/2022   HCT 32.8 (L) 08/26/2022   MCV 87.7 08/26/2022   PLT 123 (L) 08/26/2022   Lab Results  Component Value Date   FERRITIN  231 07/18/2022   IRON 36 (L) 07/18/2022   TIBC 283 07/18/2022   UIBC 247 07/18/2022   IRONPCTSAT 13 (L) 07/18/2022   Lab Results  Component Value Date   RETICCTPCT 1.5 08/26/2022   RBC 3.70 (L) 08/26/2022   No results found for: "KPAFRELGTCHN", "LAMBDASER", "KAPLAMBRATIO" No results found for: "IGGSERUM", "IGA", "IGMSERUM" Lab Results  Component Value Date   TOTALPROTELP 6.0 (L) 12/26/2015   ALBUMINELP 3.4 (L) 12/26/2015   A1GS 0.4 (H) 12/26/2015   A2GS 0.8 12/26/2015   BETS 0.4 12/26/2015   BETA2SER 0.3 12/26/2015   GAMS 0.8 12/26/2015   MSPIKE Not Observed 01/29/2016   SPEI SEE NOTE 12/26/2015     Chemistry      Component Value Date/Time   NA 141  08/01/2022 1347   NA 145 04/09/2017 0905   NA 140 03/28/2016 0853   K 4.0 08/01/2022 1347   K 4.5 04/09/2017 0905   K 4.4 03/28/2016 0853   CL 105 08/01/2022 1347   CL 105 04/09/2017 0905   CO2 19 (L) 08/01/2022 1347   CO2 27 04/09/2017 0905   CO2 19 (L) 03/28/2016 0853   BUN 31 08/01/2022 1347   BUN 33 (H) 04/09/2017 0905   BUN 34.4 (H) 03/28/2016 0853   CREATININE 1.61 (H) 08/01/2022 1347   CREATININE 1.86 (H) 07/18/2022 1159   CREATININE 1.72 (H) 08/18/2017 1543   CREATININE 1.7 (H) 03/28/2016 0853   GLU 113 07/24/2016 0000      Component Value Date/Time   CALCIUM 8.4 (L) 08/01/2022 1347   CALCIUM 9.2 04/09/2017 0905   CALCIUM 9.2 03/28/2016 0853   ALKPHOS 98 07/18/2022 1159   ALKPHOS 69 04/09/2017 0905   ALKPHOS 85 03/28/2016 0853   AST 25 07/18/2022 1159   AST 23 03/28/2016 0853   ALT 15 07/18/2022 1159   ALT 27 04/09/2017 0905   ALT 37 03/28/2016 0853   BILITOT 0.5 07/18/2022 1159   BILITOT 0.79 03/28/2016 0853       Impression and Plan: Mr. Creger is a very pleasant 89 yo caucasian gentleman with both erythropoietin deficiency and iron deficiency anemia.  ESA given today for Hgb 10.6. Iron studies are pending. We will replace if needed.  Follow-up in 1 month.   Eileen Stanford, NP 4/30/202412:13  PM

## 2022-08-26 NOTE — Patient Instructions (Signed)

## 2022-08-27 ENCOUNTER — Telehealth: Payer: Self-pay | Admitting: *Deleted

## 2022-08-27 LAB — ECHOCARDIOGRAM COMPLETE
Area-P 1/2: 3.27 cm2
MV M vel: 5.04 m/s
MV Peak grad: 101.6 mmHg
MV VTI: 1.98 cm2
S' Lateral: 3 cm
Weight: 2224.64 oz

## 2022-08-27 NOTE — Telephone Encounter (Signed)
Notified pt of iron studies. Pt verbalized understanding. No concerns at this time

## 2022-08-27 NOTE — Telephone Encounter (Signed)
-----   Message from Josph Macho, MD sent at 08/26/2022  8:16 PM EDT ----- Call - the iron studies are ok!!  Cindee Lame

## 2022-09-02 ENCOUNTER — Telehealth: Payer: Self-pay | Admitting: Family Medicine

## 2022-09-02 NOTE — Telephone Encounter (Signed)
Contacted [name] to schedule their annual wellness visit. Appointment made for 09/17/2022.  [name] [facility] AW[facility] TEAM Direct Dial [phone]

## 2022-09-09 NOTE — Progress Notes (Signed)
Remote pacemaker transmission.   

## 2022-09-15 DIAGNOSIS — H401132 Primary open-angle glaucoma, bilateral, moderate stage: Secondary | ICD-10-CM | POA: Diagnosis not present

## 2022-09-15 DIAGNOSIS — H353133 Nonexudative age-related macular degeneration, bilateral, advanced atrophic without subfoveal involvement: Secondary | ICD-10-CM | POA: Diagnosis not present

## 2022-09-15 DIAGNOSIS — H43813 Vitreous degeneration, bilateral: Secondary | ICD-10-CM | POA: Diagnosis not present

## 2022-09-15 DIAGNOSIS — H35373 Puckering of macula, bilateral: Secondary | ICD-10-CM | POA: Diagnosis not present

## 2022-09-15 DIAGNOSIS — H35033 Hypertensive retinopathy, bilateral: Secondary | ICD-10-CM | POA: Diagnosis not present

## 2022-09-15 DIAGNOSIS — Z961 Presence of intraocular lens: Secondary | ICD-10-CM | POA: Diagnosis not present

## 2022-09-17 ENCOUNTER — Ambulatory Visit (INDEPENDENT_AMBULATORY_CARE_PROVIDER_SITE_OTHER): Payer: Medicare Other

## 2022-09-17 VITALS — Wt 141.0 lb

## 2022-09-17 DIAGNOSIS — Z0000 Encounter for general adult medical examination without abnormal findings: Secondary | ICD-10-CM | POA: Diagnosis not present

## 2022-09-17 NOTE — Patient Instructions (Addendum)
[name] , Thank you for taking time to come for your Medicare Wellness Visit. I appreciate your ongoing commitment to your health goals. Please review the following plan we discussed and let me know if I can assist you in the future.   These are the goals we discussed:  Goals       patient states (pt-stated)      "Get back on the golf course" by continuing to increase activity.         This is a list of the screening recommended for you and due dates:  Health Maintenance  Topic Date Due   DTaP/Tdap/Td vaccine (1 - Tdap) Never done   Pneumonia Vaccine (1 of 1 - PCV) 01/21/2023*   Flu Shot  11/27/2022   Medicare Annual Wellness Visit  09/17/2023   Zoster (Shingles) Vaccine  Completed   HPV Vaccine  Aged Out   COVID-19 Vaccine  Discontinued  *Topic was postponed. The date shown is not the original due date.    Advanced directives: Advance directive discussed with you today. Even though you declined this today please call our office should you change your mind and we can give you the proper paperwork for you to fill out.  Conditions/risks identified: Build legs back up  Next appointment: Follow up in one year for your annual wellness visit.   Preventive Care 70 Years and Older, Male  Preventive care refers to lifestyle choices and visits with your health care provider that can promote health and wellness. What does preventive care include? A yearly physical exam. This is also called an annual well check. Dental exams once or twice a year. Routine eye exams. Ask your health care provider how often you should have your eyes checked. Personal lifestyle choices, including: Daily care of your teeth and gums. Regular physical activity. Eating a healthy diet. Avoiding tobacco and drug use. Limiting alcohol use. Practicing safe sex. Taking low doses of aspirin every day. Taking vitamin and mineral supplements as recommended by your health care provider. What happens during an  annual well check? The services and screenings done by your health care provider during your annual well check will depend on your age, overall health, lifestyle risk factors, and family history of disease. Counseling  Your health care provider may ask you questions about your: Alcohol use. Tobacco use. Drug use. Emotional well-being. Home and relationship well-being. Sexual activity. Eating habits. History of falls. Memory and ability to understand (cognition). Work and work Astronomer. Screening  You may have the following tests or measurements: Height, weight, and BMI. Blood pressure. Lipid and cholesterol levels. These may be checked every 5 years, or more frequently if you are over 69 years old. Skin check. Lung cancer screening. You may have this screening every year starting at age 46 if you have a 30-pack-year history of smoking and currently smoke or have quit within the past 15 years. Fecal occult blood test (FOBT) of the stool. You may have this test every year starting at age 61. Flexible sigmoidoscopy or colonoscopy. You may have a sigmoidoscopy every 5 years or a colonoscopy every 10 years starting at age 69. Prostate cancer screening. Recommendations will vary depending on your family history and other risks. Hepatitis C blood test. Hepatitis B blood test. Sexually transmitted disease (STD) testing. Diabetes screening. This is done by checking your blood sugar (glucose) after you have not eaten for a while (fasting). You may have this done every 1-3 years. Abdominal aortic aneurysm (AAA) screening.  You may need this if you are a current or former smoker. Osteoporosis. You may be screened starting at age 45 if you are at high risk. Talk with your health care provider about your test results, treatment options, and if necessary, the need for more tests. Vaccines  Your health care provider may recommend certain vaccines, such as: Influenza vaccine. This is recommended  every year. Tetanus, diphtheria, and acellular pertussis (Tdap, Td) vaccine. You may need a Td booster every 10 years. Zoster vaccine. You may need this after age 52. Pneumococcal 13-valent conjugate (PCV13) vaccine. One dose is recommended after age 71. Pneumococcal polysaccharide (PPSV23) vaccine. One dose is recommended after age 38. Talk to your health care provider about which screenings and vaccines you need and how often you need them. This information is not intended to replace advice given to you by your health care provider. Make sure you discuss any questions you have with your health care provider. Document Released: 05/11/2015 Document Revised: 01/02/2016 Document Reviewed: 02/13/2015 Elsevier Interactive Patient Education  2017 [employer].  Fall Prevention in the Home Falls can cause injuries. They can happen to people of all ages. There are many things you can do to make your home safe and to help prevent falls. What can I do on the outside of my home? Regularly fix the edges of walkways and driveways and fix any cracks. Remove anything that might make you trip as you walk through a door, such as a raised step or threshold. Trim any bushes or trees on the path to your home. Use bright outdoor lighting. Clear any walking paths of anything that might make someone trip, such as rocks or tools. Regularly check to see if handrails are loose or broken. Make sure that both sides of any steps have handrails. Any raised decks and porches should have guardrails on the edges. Have any leaves, snow, or ice cleared regularly. Use sand or salt on walking paths during winter. Clean up any spills in your garage right away. This includes oil or grease spills. What can I do in the bathroom? Use night lights. Install grab bars by the toilet and in the tub and shower. Do not use towel bars as grab bars. Use non-skid mats or decals in the tub or shower. If you need to sit down in the shower,  use a plastic, non-slip stool. Keep the floor dry. Clean up any water that spills on the floor as soon as it happens. Remove soap buildup in the tub or shower regularly. Attach bath mats securely with double-sided non-slip rug tape. Do not have throw rugs and other things on the floor that can make you trip. What can I do in the bedroom? Use night lights. Make sure that you have a light by your bed that is easy to reach. Do not use any sheets or blankets that are too big for your bed. They should not hang down onto the floor. Have a firm chair that has side arms. You can use this for support while you get dressed. Do not have throw rugs and other things on the floor that can make you trip. What can I do in the kitchen? Clean up any spills right away. Avoid walking on wet floors. Keep items that you use a lot in easy-to-reach places. If you need to reach something above you, use a strong step stool that has a grab bar. Keep electrical cords out of the way. Do not use floor polish or wax  that makes floors slippery. If you must use wax, use non-skid floor wax. Do not have throw rugs and other things on the floor that can make you trip. What can I do with my stairs? Do not leave any items on the stairs. Make sure that there are handrails on both sides of the stairs and use them. Fix handrails that are broken or loose. Make sure that handrails are as long as the stairways. Check any carpeting to make sure that it is firmly attached to the stairs. Fix any carpet that is loose or worn. Avoid having throw rugs at the top or bottom of the stairs. If you do have throw rugs, attach them to the floor with carpet tape. Make sure that you have a light switch at the top of the stairs and the bottom of the stairs. If you do not have them, ask someone to add them for you. What else can I do to help prevent falls? Wear shoes that: Do not have high heels. Have rubber bottoms. Are comfortable and fit you  well. Are closed at the toe. Do not wear sandals. If you use a stepladder: Make sure that it is fully opened. Do not climb a closed stepladder. Make sure that both sides of the stepladder are locked into place. Ask someone to hold it for you, if possible. Clearly mark and make sure that you can see: Any grab bars or handrails. First and last steps. Where the edge of each step is. Use tools that help you move around (mobility aids) if they are needed. These include: Canes. Walkers. Scooters. Crutches. Turn on the lights when you go into a dark area. Replace any light bulbs as soon as they burn out. Set up your furniture so you have a clear path. Avoid moving your furniture around. If any of your floors are uneven, fix them. If there are any pets around you, be aware of where they are. Review your medicines with your doctor. Some medicines can make you feel dizzy. This can increase your chance of falling. Ask your doctor what other things that you can do to help prevent falls. This information is not intended to replace advice given to you by your health care provider. Make sure you discuss any questions you have with your health care provider. Document Released: 02/08/2009 Document Revised: 09/20/2015 Document Reviewed: 05/19/2014 Elsevier Interactive Patient Education  2017 [employer].

## 2022-09-17 NOTE — Progress Notes (Signed)
I connected with  [name] on 09/17/22 by a audio enabled telemedicine application and verified that I am speaking with the correct person using two identifiers.  Patient Location: Home  Provider Location: Home Office  I discussed the limitations of evaluation and management by telemedicine. The patient expressed understanding and agreed to proceed.   Subjective:   [name] is a 89 y.o. male who presents for Medicare Annual/Subsequent preventive examination.  Review of Systems     Cardiac Risk Factors include: advanced age (>53men, >43 women);dyslipidemia;male gender;hypertension     Objective:    Today's Vitals   09/17/22 1405  Weight: 141 lb (64 kg)   Body mass index is 22.08 kg/m.     09/17/2022    2:13 PM 08/26/2022   12:14 PM 07/24/2022   10:54 AM 05/30/2022   12:25 PM 05/13/2022    1:00 PM 05/12/2022    3:10 PM 04/17/2022   11:36 AM  Advanced Directives  Does Patient Have a Medical Advance Directive? No Yes Yes Yes Yes Yes Yes  Type of Furniture conservator/restorer;Living will Healthcare Power of [place];Living will Healthcare Power of [place];Living will Healthcare Power of [place];Living will Healthcare Power of [place];Living will Healthcare Power of [place];Living will  Does patient want to make changes to medical advance directive?   No - Patient declined  No - Patient declined  No - Patient declined  Copy of Healthcare Power of Attorney in Chart?  No - copy requested No - copy requested No - copy requested No - copy requested  No - copy requested  Would patient like information on creating a medical advance directive? No - Patient declined No - Patient declined No - Patient declined    No - Patient declined    Current Medications (verified) Outpatient Encounter Medications as of 09/17/2022  Medication Sig   allopurinol (ZYLOPRIM) 300 MG tablet Take 0.5 tablets (150 mg total) by mouth daily.   amLODipine (NORVASC) 5 MG tablet  Take 1 tablet (5 mg total) by mouth at bedtime.   apixaban (ELIQUIS) 2.5 MG TABS tablet Take 1 tablet (2.5 mg total) by mouth 2 (two) times daily.   atorvastatin (LIPITOR) 20 MG tablet Take 1 tablet (20 mg total) by mouth at bedtime.   carvedilol (COREG) 6.25 MG tablet Take 1 tablet (6.25 mg total) by mouth 2 (two) times daily with a meal.   Cholecalciferol 125 MCG (5000 UT) capsule Take 5,000 Units by mouth daily.   dicyclomine (BENTYL) 10 MG capsule Take 10 mg by mouth every 6 (six) hours as needed.   finasteride (PROSCAR) 5 MG tablet Take 5 mg by mouth daily.   fish oil-omega-3 fatty acids 1000 MG capsule Take 1 g by mouth daily.    fluticasone (FLONASE) 50 MCG/ACT nasal spray Place 2 sprays into both nostrils daily.   furosemide (LASIX) 40 MG tablet Take 1 tablet in the morning and 1/2 tablet in the evening   glucose blood (ONETOUCH VERIO) test strip Use as instructed   Lancets (ONETOUCH ULTRASOFT) lancets Use as instructed   latanoprost (XALATAN) 0.005 % ophthalmic solution Place 1 drop into both eyes at bedtime.   Multiple Vitamins-Minerals (PRESERVISION AREDS PO) Take 1 tablet by mouth 2 (two) times daily.    potassium chloride (KLOR-CON) 10 MEQ tablet Take 1 tablet (10 mEq total) by mouth daily.   Probiotic Product (PROBIOTIC PO) Take 1 tablet by mouth daily.   tamsulosin (FLOMAX) 0.4 MG CAPS capsule Take  0.4 mg by mouth in the morning and at bedtime.   timolol (TIMOPTIC) 0.5 % ophthalmic solution Place 1 drop into both eyes 2 (two) times daily.    UNABLE TO FIND Take 2 tablets by mouth every morning. Med Name: [name] (takes place of Co Q 10)   No facility-administered encounter medications on file as of 09/17/2022.    Allergies (verified) Lisinopril, Penicillins, and Vancomycin   History: Past Medical History:  Diagnosis Date   A-fib (HCC)    Basal cell carcinoma    skin   Bigeminal rhythm    Bradycardia 06/21/2017   Cancer of anterior wall of urinary bladder (HCC) 07/18/2019    Cataract    Chronic renal insufficiency, stage 3 (moderate) (HCC) 2018   GFR 30s-40s   Coronary artery disease    post bypass   CVD (cardiovascular disease)    Diabetes mellitus without complication (HCC)    type 2 diet controlled   Diverticular disease    Easy bruising    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   Gout    Hematuria 06/30/2019   Hernia    Hyperkalemia 05/06/2017   Hyperlipidemia    Hypertension    IBS (irritable bowel syndrome)    Iron deficiency anemia 02/04/2016   Macular degeneration    Microscopic colitis    PVD (peripheral vascular disease) (HCC)    Past Surgical History:  Procedure Laterality Date   CARDIAC CATHETERIZATION  2006   CARDIOVERSION N/A 02/22/2013   Procedure: CARDIOVERSION;  Surgeon: Wendall Stade, MD;  Location: [facility] ENDOSCOPY;  Service: Cardiovascular;  Laterality: N/A;   CARDIOVERSION N/A 11/15/2014   Procedure: CARDIOVERSION;  Surgeon: Wendall Stade, MD;  Location: [facility] ENDOSCOPY;  Service: Cardiovascular;  Laterality: N/A;   CARDIOVERSION N/A 04/01/2017   Procedure: CARDIOVERSION;  Surgeon: Laurey Morale, MD;  Location: [facility] ENDOSCOPY;  Service: Cardiovascular;  Laterality: N/A;   CAROTID ENDARTERECTOMY  2009/ 1993   left/ right   COLONOSCOPY  03/17/2005   The colon is normal.   CORONARY ARTERY BYPASS GRAFT  1999   CYSTOSCOPY W/ RETROGRADES Bilateral 07/14/2019   Procedure: CYSTOSCOPY WITH RETROGRADE PYELOGRAM;  Surgeon: Marcine Matar, MD;  Location: [facility];  Service: Urology;  Laterality: Bilateral;   ESOPHAGOGASTRODUODENOSCOPY  03/17/2005   Normal esophagus. Normal Stomanch. Normal duodenum.    EYE SURGERY     eyelid repair   INGUINAL HERNIA REPAIR  02/11/2012   Procedure: LAPAROSCOPIC BILATERAL INGUINAL HERNIA REPAIR;  Surgeon: Valarie Merino, MD;  Location: [facility];  Service: General;  Laterality: Bilateral;   PACEMAKER IMPLANT N/A 07/28/2017   St Jude Medical Assurity MRI conditional  dual-chamber  pacemaker for symptomatic second degree AV block by Dr Johney Frame   SKIN CANCER EXCISION     right ear x 3   TRANSURETHRAL RESECTION OF BLADDER TUMOR N/A 09/05/2019   Procedure: TRANSURETHRAL RESECTION OF BLADDER TUMOR (TURBT);  Surgeon: Marcine Matar, MD;  Location: [facility];  Service: Urology;  Laterality: N/A;   TRANSURETHRAL RESECTION OF BLADDER TUMOR WITH MITOMYCIN-C N/A 07/14/2019   Procedure: TRANSURETHRAL RESECTION OF BLADDER TUMOR WITH GEMCITABINE IN PACU;  Surgeon: Marcine Matar, MD;  Location: [facility];  Service: Urology;  Laterality: N/A;   Family History  Problem Relation Age of Onset   Stroke Mother    Coronary artery disease Father    Heart disease Father    Melanoma Sister    Colon cancer Neg Hx    Social History  Socioeconomic History   Marital status: Married    Spouse name: Not on file   Number of children: Not on file   Years of education: Not on file   Highest education level: Not on file  Occupational History   Occupation: retired  Tobacco Use   Smoking status: Former   Smokeless tobacco: Former    Types: Chew    Quit date: 02/03/1969  Vaping Use   Vaping Use: Never used  Substance and Sexual Activity   Alcohol use: No    Alcohol/week: 0.0 standard drinks of alcohol   Drug use: No   Sexual activity: Not Currently  Other Topics Concern   Not on file  Social History Narrative   Married to [place], 2 children Ethelene Browns and [name].   Some college. Retired from [employer].   Drinks caffeine, uses herbal remedies, takes a daily vitamin.   Wears his seatbelt, smoke detector at home, firearms in the home.   Wears a hearing aid.   Feels safe in her relationships.   Social Determinants of Health   Financial Resource Strain: Low Risk  (09/17/2022)   Overall Financial Resource Strain (CARDIA)    Difficulty of Paying Living Expenses: Not hard at all  Food Insecurity: No Food Insecurity (09/17/2022)   Hunger Vital  Sign    Worried About Running Out of Food in the Last Year: Never true    Ran Out of Food in the Last Year: Never true  Transportation Needs: No Transportation Needs (09/17/2022)   PRAPARE - Administrator, Civil Service (Medical): No    Lack of Transportation (Non-Medical): No  Physical Activity: Insufficiently Active (09/17/2022)   Exercise Vital Sign    Days of Exercise per Week: 3 days    Minutes of Exercise per Session: 30 min  Stress: No Stress Concern Present (09/17/2022)   [employer] of Occupational Health - Occupational Stress Questionnaire    Feeling of Stress : Not at all  Social Connections: Moderately Integrated (09/17/2022)   Social Connection and Isolation Panel [NHANES]    Frequency of Communication with Friends and Family: More than three times a week    Frequency of Social Gatherings with Friends and Family: More than three times a week    Attends Religious Services: More than 4 times per year    Active Member of [employer] or Organizations: No    Attends Engineer, structural: Never    Marital Status: Married    Tobacco Counseling Counseling given: Not Answered   Clinical Intake:  Pre-visit preparation completed: Yes  Pain : No/denies pain     BMI - recorded: 22.08 Nutritional Status: BMI of 19-24  Normal Nutritional Risks: None Diabetes: No  How often do you need to have someone help you when you read instructions, pamphlets, or other written materials from your doctor or pharmacy?: 1 - Never  Diabetic?no  Interpreter Needed?: No  Information entered by :: Lanier Ensign, LPN   Activities of Daily Living    09/17/2022    2:14 PM 05/13/2022    1:00 PM  In your present state of health, do you have any difficulty performing the following activities:  Hearing? 1 1  Comment hearing aids   Vision? 0 1  Difficulty concentrating or making decisions? 0 0  Walking or climbing stairs? 0 0  Dressing or bathing? 0 0  Doing errands,  shopping? 0 0  Preparing Food and eating ? N   Using the Toilet? N  In the past six months, have you accidently leaked urine? N   Do you have problems with loss of bowel control? N   Managing your Medications? N   Managing your Finances? N   Housekeeping or managing your Housekeeping? N     Patient Care Team: Natalia Leatherwood, DO as PCP - General (Family Medicine) Wendall Stade, MD as PCP - Cardiology (Cardiology) Hillis Range, MD (Inactive) as PCP - Electrophysiology (Cardiology) Janet Berlin, MD as Consulting Physician (Ophthalmology) Mathews Robinsons, MD as Consulting Physician (Dermatology) Wendall Stade, MD as Consulting Physician (Cardiology) Stephannie Li, MD as Consulting Physician (Ophthalmology) Myna Hidalgo Rose Phi, MD as Consulting Physician (Oncology) Arita Miss, MD as Attending Physician (Nephrology) Donzetta Starch, MD as Consulting Physician (Dermatology) Marcine Matar, MD as Consulting Physician (Urology) Merwyn Katos, DPM as Consulting Physician (Podiatry)  Indicate any recent Medical Services you may have received from other than [facility] providers in the past year (date may be approximate).     Assessment:   This is a routine wellness examination for [name].  Hearing/Vision screen Hearing Screening - Comments:: Hearing aids  Vision Screening - Comments:: Pt follows up with Dr Burgess Estelle for annual eye exams   Dietary issues and exercise activities discussed: Current Exercise Habits: Home exercise routine, Type of exercise: walking, Time (Minutes): 30, Frequency (Times/Week): 3, Weekly Exercise (Minutes/Week): 90   Goals Addressed             This Visit's Progress    Patient Stated       Build my legs back up       Depression Screen    09/17/2022    2:12 PM 07/22/2022    8:53 AM 05/29/2022    9:52 AM 07/12/2021    6:28 PM 07/12/2021    6:26 PM 02/11/2021    2:02 PM 05/02/2020    1:33 PM  PHQ 2/9 Scores  PHQ - 2 Score 0 0 0 0 0 0 0    Fall  Risk    09/17/2022    2:14 PM 07/22/2022    8:52 AM 07/12/2021    6:28 PM 07/12/2021    6:21 PM 02/11/2021    2:01 PM  Fall Risk   Falls in the past year? 1 0 1 1 0  Number falls in past yr: 0 0 0 0 0  Injury with Fall? 1 0 0 0 0  Comment minor bruises      Risk for fall due to : Impaired vision   Impaired vision   Follow up Falls prevention discussed Falls evaluation completed Falls evaluation completed Falls evaluation completed Falls evaluation completed    FALL RISK PREVENTION PERTAINING TO THE HOME:  Any stairs in or around the home? No  If so, are there any without handrails? No  Home free of loose throw rugs in walkways, pet beds, electrical cords, etc? Yes  Adequate lighting in your home to reduce risk of falls? Yes   ASSISTIVE DEVICES UTILIZED TO PREVENT FALLS:  Life alert? No  Use of a cane, walker or w/c? No  Grab bars in the bathroom? No  Shower chair or bench in shower? No  Elevated toilet seat or a handicapped toilet? No   TIMED UP AND GO:  Was the test performed? No .   Cognitive Function:        09/17/2022    2:15 PM 07/12/2021    6:22 PM  6CIT Screen  What Year? 0 points 0 points  What  month? 0 points 0 points  What time? 0 points 0 points  Count back from 20 0 points 0 points  Months in reverse 0 points 0 points  Repeat phrase 0 points 0 points  Total Score 0 points 0 points    Immunizations Immunization History  Administered Date(s) Administered   Fluad Quad(high Dose 65+) 01/10/2019, 02/11/2021, 01/20/2022   Influenza, High Dose Seasonal PF 12/11/2016, 12/31/2017   Influenza,inj,Quad PF,6+ Mos 02/07/2016   Influenza-Unspecified 01/08/2015, 12/11/2016, 01/18/2020   PFIZER(Purple Top)SARS-COV-2 Vaccination 06/23/2019, 07/26/2019, 01/26/2020   Zoster Recombinat (Shingrix) 02/13/2018, 04/14/2018    TDAP status: Due, Education has been provided regarding the importance of this vaccine. Advised may receive this vaccine at local pharmacy or  Health Dept. Aware to provide a copy of the vaccination record if obtained from local pharmacy or Health Dept. Verbalized acceptance and understanding.  Flu Vaccine status: Up to date  Pneumococcal vaccine status: Due, Education has been provided regarding the importance of this vaccine. Advised may receive this vaccine at local pharmacy or Health Dept. Aware to provide a copy of the vaccination record if obtained from local pharmacy or Health Dept. Verbalized acceptance and understanding.  Covid-19 vaccine status: Completed vaccines  Qualifies for Shingles Vaccine? Yes   Zostavax completed Yes   Shingrix Completed?: Yes  Screening Tests Health Maintenance  Topic Date Due   DTaP/Tdap/Td (1 - Tdap) Never done   Pneumonia Vaccine 65+ Years old (1 of 1 - PCV) 01/21/2023 (Originally 07/30/1995)   INFLUENZA VACCINE  11/27/2022   Medicare Annual Wellness (AWV)  09/17/2023   Zoster Vaccines- Shingrix  Completed   HPV VACCINES  Aged Out   COVID-19 Vaccine  Discontinued    Health Maintenance  Health Maintenance Due  Topic Date Due   DTaP/Tdap/Td (1 - Tdap) Never done    Colorectal cancer screening: No longer required.    Additional Screening:   Vision Screening: Recommended annual ophthalmology exams for early detection of glaucoma and other disorders of the eye. Is the patient up to date with their annual eye exam?  Yes  Who is the provider or what is the name of the office in which the patient attends annual eye exams? Dr Burgess Estelle  If pt is not established with a provider, would they like to be referred to a provider to establish care? No .   Dental Screening: Recommended annual dental exams for proper oral hygiene  Community Resource Referral / Chronic Care Management: CRR required this visit?  No   CCM required this visit?  No      Plan:     I have personally reviewed and noted the following in the patient's chart:   Medical and social history Use of alcohol, tobacco  or illicit drugs  Current medications and supplements including opioid prescriptions. Patient is not currently taking opioid prescriptions. Functional ability and status Nutritional status Physical activity Advanced directives List of other physicians Hospitalizations, surgeries, and ER visits in previous 12 months Vitals Screenings to include cognitive, depression, and falls Referrals and appointments  In addition, I have reviewed and discussed with patient certain preventive protocols, quality metrics, and best practice recommendations. A written personalized care plan for preventive services as well as general preventive health recommendations were provided to patient.     Marzella Schlein, LPN   07/28/270   Nurse Notes: none

## 2022-09-18 DIAGNOSIS — Z85828 Personal history of other malignant neoplasm of skin: Secondary | ICD-10-CM | POA: Diagnosis not present

## 2022-09-18 DIAGNOSIS — C44622 Squamous cell carcinoma of skin of right upper limb, including shoulder: Secondary | ICD-10-CM | POA: Diagnosis not present

## 2022-09-18 DIAGNOSIS — D485 Neoplasm of uncertain behavior of skin: Secondary | ICD-10-CM | POA: Diagnosis not present

## 2022-09-18 DIAGNOSIS — C44629 Squamous cell carcinoma of skin of left upper limb, including shoulder: Secondary | ICD-10-CM | POA: Diagnosis not present

## 2022-09-23 DIAGNOSIS — L603 Nail dystrophy: Secondary | ICD-10-CM | POA: Diagnosis not present

## 2022-09-23 DIAGNOSIS — E1151 Type 2 diabetes mellitus with diabetic peripheral angiopathy without gangrene: Secondary | ICD-10-CM | POA: Diagnosis not present

## 2022-09-23 DIAGNOSIS — I739 Peripheral vascular disease, unspecified: Secondary | ICD-10-CM | POA: Diagnosis not present

## 2022-09-23 DIAGNOSIS — L84 Corns and callosities: Secondary | ICD-10-CM | POA: Diagnosis not present

## 2022-09-24 ENCOUNTER — Other Ambulatory Visit: Payer: Self-pay

## 2022-09-24 ENCOUNTER — Telehealth: Payer: Self-pay | Admitting: Family Medicine

## 2022-09-24 MED ORDER — ONETOUCH ULTRASOFT LANCETS MISC: 12 refills | Status: DC

## 2022-09-24 MED ORDER — ONETOUCH VERIO VI STRP: ORAL_STRIP | 12 refills | Status: DC

## 2022-09-24 NOTE — Telephone Encounter (Signed)
Patient is needing refill on Lancets (ONETOUCH ULTRASOFT) lancets  and glucose blood (ONETOUCH VERIO) test strip.  Pharmacy is [facility]

## 2022-09-24 NOTE — Telephone Encounter (Signed)
Rx sent 

## 2022-09-25 ENCOUNTER — Other Ambulatory Visit: Payer: Self-pay

## 2022-09-25 ENCOUNTER — Encounter: Payer: Self-pay | Admitting: Hematology & Oncology

## 2022-09-25 ENCOUNTER — Inpatient Hospital Stay: Payer: Medicare Other

## 2022-09-25 ENCOUNTER — Inpatient Hospital Stay (HOSPITAL_BASED_OUTPATIENT_CLINIC_OR_DEPARTMENT_OTHER): Payer: Medicare Other | Admitting: Hematology & Oncology

## 2022-09-25 ENCOUNTER — Inpatient Hospital Stay: Payer: Medicare Other | Attending: Hematology & Oncology

## 2022-09-25 VITALS — BP 144/51 | HR 61 | Temp 98.4°F | Resp 17 | Ht 67.0 in | Wt 145.0 lb

## 2022-09-25 DIAGNOSIS — C673 Malignant neoplasm of anterior wall of bladder: Secondary | ICD-10-CM

## 2022-09-25 DIAGNOSIS — D631 Anemia in chronic kidney disease: Secondary | ICD-10-CM | POA: Insufficient documentation

## 2022-09-25 DIAGNOSIS — N183 Chronic kidney disease, stage 3 unspecified: Secondary | ICD-10-CM | POA: Diagnosis not present

## 2022-09-25 DIAGNOSIS — R7303 Prediabetes: Secondary | ICD-10-CM

## 2022-09-25 DIAGNOSIS — D509 Iron deficiency anemia, unspecified: Secondary | ICD-10-CM

## 2022-09-25 DIAGNOSIS — N184 Chronic kidney disease, stage 4 (severe): Secondary | ICD-10-CM | POA: Diagnosis present

## 2022-09-25 DIAGNOSIS — D508 Other iron deficiency anemias: Secondary | ICD-10-CM

## 2022-09-25 LAB — CBC WITH DIFFERENTIAL (CANCER CENTER ONLY)
Abs Immature Granulocytes: 0.06 10*3/uL (ref 0.00–0.07)
Basophils Absolute: 0 10*3/uL (ref 0.0–0.1)
Basophils Relative: 0 %
Eosinophils Absolute: 0.1 10*3/uL (ref 0.0–0.5)
Eosinophils Relative: 2 %
HCT: 32.8 % — ABNORMAL LOW (ref 39.0–52.0)
Hemoglobin: 10.5 g/dL — ABNORMAL LOW (ref 13.0–17.0)
Immature Granulocytes: 1 %
Lymphocytes Relative: 22 %
Lymphs Abs: 1.2 10*3/uL (ref 0.7–4.0)
MCH: 29.2 pg (ref 26.0–34.0)
MCHC: 32 g/dL (ref 30.0–36.0)
MCV: 91.4 fL (ref 80.0–100.0)
Monocytes Absolute: 0.6 10*3/uL (ref 0.1–1.0)
Monocytes Relative: 10 %
Neutro Abs: 3.8 10*3/uL (ref 1.7–7.7)
Neutrophils Relative %: 65 %
Platelet Count: 124 10*3/uL — ABNORMAL LOW (ref 150–400)
RBC: 3.59 MIL/uL — ABNORMAL LOW (ref 4.22–5.81)
RDW: 19.2 % — ABNORMAL HIGH (ref 11.5–15.5)
WBC Count: 5.8 10*3/uL (ref 4.0–10.5)
nRBC: 0 % (ref 0.0–0.2)

## 2022-09-25 LAB — CMP (CANCER CENTER ONLY)
ALT: 28 U/L (ref 0–44)
AST: 35 U/L (ref 15–41)
Albumin: 4 g/dL (ref 3.5–5.0)
Alkaline Phosphatase: 89 U/L (ref 38–126)
Anion gap: 14 (ref 5–15)
BUN: 54 mg/dL — ABNORMAL HIGH (ref 8–23)
CO2: 26 mmol/L (ref 22–32)
Calcium: 9.6 mg/dL (ref 8.9–10.3)
Chloride: 100 mmol/L (ref 98–111)
Creatinine: 2.21 mg/dL — ABNORMAL HIGH (ref 0.61–1.24)
GFR, Estimated: 27 mL/min — ABNORMAL LOW (ref >60)
Glucose, Bld: 129 mg/dL — ABNORMAL HIGH (ref 70–99)
Potassium: 4.1 mmol/L (ref 3.5–5.1)
Sodium: 140 mmol/L (ref 135–145)
Total Bilirubin: 0.6 mg/dL (ref 0.3–1.2)
Total Protein: 7.8 g/dL (ref 6.5–8.1)

## 2022-09-25 LAB — IRON AND IRON BINDING CAPACITY (CC-WL,HP ONLY)
Iron: 67 ug/dL (ref 45–182)
Saturation Ratios: 25 % (ref 17.9–39.5)
TIBC: 266 ug/dL (ref 250–450)
UIBC: 199 ug/dL (ref 117–376)

## 2022-09-25 LAB — RETICULOCYTES
Immature Retic Fract: 6.9 % (ref 2.3–15.9)
RBC.: 3.64 MIL/uL — ABNORMAL LOW (ref 4.22–5.81)
Retic Count, Absolute: 38.9 10*3/uL (ref 19.0–186.0)
Retic Ct Pct: 1.1 % (ref 0.4–3.1)

## 2022-09-25 LAB — FERRITIN: Ferritin: 425 ng/mL — ABNORMAL HIGH (ref 24–336)

## 2022-09-25 MED ORDER — DARBEPOETIN ALFA 300 MCG/0.6ML IJ SOSY
300.0000 ug | PREFILLED_SYRINGE | Freq: Once | INTRAMUSCULAR | Status: AC
  Administered 2022-09-25: 300 ug via SUBCUTANEOUS
  Filled 2022-09-25: qty 0.6

## 2022-09-25 MED ORDER — ONETOUCH ULTRASOFT LANCETS MISC
12 refills | Status: DC
Start: 2022-09-25 — End: 2023-12-14

## 2022-09-25 MED ORDER — ONETOUCH VERIO VI STRP
ORAL_STRIP | 12 refills | Status: DC
Start: 2022-09-25 — End: 2023-11-02

## 2022-09-25 NOTE — Progress Notes (Signed)
Hematology and Oncology Follow Up Visit  [name] [id number] [birth year] 89 y.o. 09/25/2022   Principle Diagnosis:  Erythropoietin deficiency anemia Iron deficiency anemia Chronic atrial fibrillation  Current Therapy:   IV iron as indicated -- feraheme given on 09/18/2021 Aranesp 300 g sq prn hemoglobin < 11 - last dose given on  09/19/2021 Eliquis 2.5 mg p.o. twice daily   Interim History:  [name] is here today for follow-up.  He is doing pretty well.  He has not had any problems with congestive heart failure.  I am happy about that part.  He does have the atrial fibrillation.  This seems to be under fairly good control.  He had iron studies that were done back in April.  William ferritin was 710 with an iron saturation of 41%.  [name] is not doing well.  She has a lot of back pain.  She has had compression fractures.  He is busy taking care of her.  There has been no issues with bleeding.  He has had no change in bowel or bladder habits.  He does have some macular degeneration.  He actually may be taking a medication to try to help this.  Currently, I would say that William performance status is probably ECOG 2.  She  Medications:  Allergies as of 09/25/2022       Reactions   Lisinopril Other (See Comments)    he has difficulty controlling potassium on ace/arb- lisinopril was dc'd in the past 2/2 to hyperkalemia documented by [facility] P[facility] 01/2021   Penicillins Hives   Has patient had a PCN reaction causing immediate rash, facial/tongue/throat swelling, SOB or lightheadedness with hypotension: No Has patient had a PCN reaction causing severe rash involving mucus membranes or skin necrosis: Yes Has patient had a PCN reaction that required hospitalization: No Has patient had a PCN reaction occurring within the last 10 years: No If all of the above answers are "NO", then may proceed with Cephalosporin use.   Vancomycin Other (See Comments)   Edema and myalgia         Medication List        Accurate as of Sep 25, 2022 11:54 AM. If you have any questions, ask your nurse or doctor.          allopurinol 300 MG tablet Commonly known as: ZYLOPRIM Take 0.5 tablets (150 mg total) by mouth daily.   amLODipine 5 MG tablet Commonly known as: NORVASC Take 1 tablet (5 mg total) by mouth at bedtime.   apixaban 2.5 MG Tabs tablet Commonly known as: Eliquis Take 1 tablet (2.5 mg total) by mouth 2 (two) times daily.   atorvastatin 20 MG tablet Commonly known as: LIPITOR Take 1 tablet (20 mg total) by mouth at bedtime.   carvedilol 6.25 MG tablet Commonly known as: COREG Take 1 tablet (6.25 mg total) by mouth 2 (two) times daily with a meal.   Cholecalciferol 125 MCG (5000 UT) capsule Take 5,000 Units by mouth daily.   dicyclomine 10 MG capsule Commonly known as: BENTYL Take 10 mg by mouth every 6 (six) hours as needed.   finasteride 5 MG tablet Commonly known as: PROSCAR Take 5 mg by mouth daily.   fish oil-omega-3 fatty acids 1000 MG capsule Take 1 g by mouth daily.   fluticasone 50 MCG/ACT nasal spray Commonly known as: FLONASE Place 2 sprays into both nostrils daily.   furosemide 40 MG tablet Commonly known as: LASIX Take 1 tablet in  the morning and 1/2 tablet in the evening   latanoprost 0.005 % ophthalmic solution Commonly known as: XALATAN Place 1 drop into both eyes at bedtime.   onetouch ultrasoft lancets Use as instructed   OneTouch Verio test strip Generic drug: glucose blood Use as instructed   potassium chloride 10 MEQ tablet Commonly known as: KLOR-CON Take 1 tablet (10 mEq total) by mouth daily.   PRESERVISION AREDS PO Take 1 tablet by mouth 2 (two) times daily.   PROBIOTIC PO Take 1 tablet by mouth daily.   tamsulosin 0.4 MG Caps capsule Commonly known as: FLOMAX Take 0.4 mg by mouth in the morning and at bedtime.   timolol 0.5 % ophthalmic solution Commonly known as: TIMOPTIC Place 1 drop into  both eyes 2 (two) times daily.   UNABLE TO FIND Take 2 tablets by mouth every morning. Med Name: [name] (takes place of Co Q 10)        Allergies:  Allergies  Allergen Reactions   Lisinopril Other (See Comments)     he has difficulty controlling potassium on ace/arb- lisinopril was dc'd in the past 2/2 to hyperkalemia documented by [facility] P[facility] 01/2021   Penicillins Hives    Has patient had a PCN reaction causing immediate rash, facial/tongue/throat swelling, SOB or lightheadedness with hypotension: No Has patient had a PCN reaction causing severe rash involving mucus membranes or skin necrosis: Yes Has patient had a PCN reaction that required hospitalization: No Has patient had a PCN reaction occurring within the last 10 years: No If all of the above answers are "NO", then may proceed with Cephalosporin use.    Vancomycin Other (See Comments)    Edema and myalgia    Past Medical History, Surgical history, Social history, and Family History were reviewed and updated.  Review of Systems: Review of Systems  Constitutional: Negative.   HENT: Negative.    Eyes: Negative.   Respiratory: Negative.    Cardiovascular:  Positive for palpitations.  Gastrointestinal: Negative.   Genitourinary: Negative.   Musculoskeletal: Negative.   Skin: Negative.   Neurological: Negative.   Endo/Heme/Allergies: Negative.   Psychiatric/Behavioral: Negative.       Physical Exam: Vital signs show temperature of 98.  Pulse 60.  Blood pressure 146/74.  Weight is 144 pounds.  Wt Readings from Last 3 Encounters:  09/25/22 145 lb (65.8 kg)  09/17/22 141 lb (64 kg)  08/26/22 139 lb 0.6 oz (63.1 kg)    Physical Exam Vitals reviewed.  HENT:     Head: Normocephalic and atraumatic.  Eyes:     Pupils: Pupils are equal, round, and reactive to light.  Cardiovascular:     Rate and Rhythm: Normal rate and regular rhythm.     Heart sounds: Normal heart sounds.  Pulmonary:     Effort: Pulmonary  effort is normal.     Breath sounds: Normal breath sounds.  Abdominal:     General: Bowel sounds are normal.     Palpations: Abdomen is soft.  Musculoskeletal:        General: No tenderness or deformity. Normal range of motion.     Cervical back: Normal range of motion.  Lymphadenopathy:     Cervical: No cervical adenopathy.  Skin:    General: Skin is warm and dry.     Findings: No erythema or rash.  Neurological:     Mental Status: He is alert and oriented to person, place, and time.  Psychiatric:  Behavior: Behavior normal.        Thought Content: Thought content normal.        Judgment: Judgment normal.     Lab Results  Component Value Date   WBC 5.8 09/25/2022   HGB 10.5 (L) 09/25/2022   HCT 32.8 (L) 09/25/2022   MCV 91.4 09/25/2022   PLT 124 (L) 09/25/2022   Lab Results  Component Value Date   FERRITIN 710 (H) 08/26/2022   IRON 110 08/26/2022   TIBC 272 08/26/2022   UIBC 162 08/26/2022   IRONPCTSAT 41 (H) 08/26/2022   Lab Results  Component Value Date   RETICCTPCT 1.1 09/25/2022   RBC 3.64 (L) 09/25/2022   No results found for: "KPAFRELGTCHN", "LAMBDASER", "KAPLAMBRATIO" No results found for: "IGGSERUM", "IGA", "IGMSERUM" Lab Results  Component Value Date   TOTALPROTELP 6.0 (L) 12/26/2015   ALBUMINELP 3.4 (L) 12/26/2015   A1GS 0.4 (H) 12/26/2015   A2GS 0.8 12/26/2015   BETS 0.4 12/26/2015   BETA2SER 0.3 12/26/2015   GAMS 0.8 12/26/2015   MSPIKE Not Observed 01/29/2016   SPEI SEE NOTE 12/26/2015     Chemistry      Component Value Date/Time   NA 140 09/25/2022 1038   NA 141 08/01/2022 1347   NA 145 04/09/2017 0905   NA 140 03/28/2016 0853   K 4.1 09/25/2022 1038   K 4.5 04/09/2017 0905   K 4.4 03/28/2016 0853   CL 100 09/25/2022 1038   CL 105 04/09/2017 0905   CO2 26 09/25/2022 1038   CO2 27 04/09/2017 0905   CO2 19 (L) 03/28/2016 0853   BUN 54 (H) 09/25/2022 1038   BUN 31 08/01/2022 1347   BUN 33 (H) 04/09/2017 0905   BUN 34.4 (H)  03/28/2016 0853   CREATININE 2.21 (H) 09/25/2022 1038   CREATININE 1.72 (H) 08/18/2017 1543   CREATININE 1.7 (H) 03/28/2016 0853   GLU 113 07/24/2016 0000      Component Value Date/Time   CALCIUM 9.6 09/25/2022 1038   CALCIUM 9.2 04/09/2017 0905   CALCIUM 9.2 03/28/2016 0853   ALKPHOS 89 09/25/2022 1038   ALKPHOS 69 04/09/2017 0905   ALKPHOS 85 03/28/2016 0853   AST 35 09/25/2022 1038   AST 23 03/28/2016 0853   ALT 28 09/25/2022 1038   ALT 27 04/09/2017 0905   ALT 37 03/28/2016 0853   BILITOT 0.6 09/25/2022 1038   BILITOT 0.79 03/28/2016 0853      Impression and Plan: [name] is a very pleasant 89 yo caucasian gentleman with both erythropoietin deficiency and iron deficiency anemia.   We will go ahead and give him Aranesp today.  I would like to see William blood count little bit higher.  We will see what William iron levels look like.  He will come back in another 3 weeks or so.  I do see him at church on Sunday.  If he has any problems, he was let me know insurance.   Josph Macho, MD 5/30/202411:54 AM

## 2022-09-25 NOTE — Patient Instructions (Signed)

## 2022-09-29 ENCOUNTER — Ambulatory Visit: Payer: Medicare Other | Attending: Cardiovascular Disease

## 2022-09-29 DIAGNOSIS — I5033 Acute on chronic diastolic (congestive) heart failure: Secondary | ICD-10-CM | POA: Diagnosis not present

## 2022-09-29 DIAGNOSIS — Z79899 Other long term (current) drug therapy: Secondary | ICD-10-CM | POA: Diagnosis not present

## 2022-09-30 LAB — BASIC METABOLIC PANEL
BUN/Creatinine Ratio: 20 (ref 10–24)
BUN: 39 mg/dL — ABNORMAL HIGH (ref 10–36)
CO2: 23 mmol/L (ref 20–29)
Calcium: 9.1 mg/dL (ref 8.6–10.2)
Chloride: 100 mmol/L (ref 96–106)
Creatinine, Ser: 1.95 mg/dL — ABNORMAL HIGH (ref 0.76–1.27)
Glucose: 123 mg/dL — ABNORMAL HIGH (ref 70–99)
Potassium: 4.1 mmol/L (ref 3.5–5.2)
Sodium: 137 mmol/L (ref 134–144)
eGFR: 32 mL/min/{1.73_m2} — ABNORMAL LOW (ref >59)

## 2022-09-30 LAB — PRO B NATRIURETIC PEPTIDE: NT-Pro BNP: 3678 pg/mL — ABNORMAL HIGH (ref 0–486)

## 2022-10-01 ENCOUNTER — Telehealth: Payer: Self-pay | Admitting: Cardiovascular Disease

## 2022-10-01 NOTE — Telephone Encounter (Signed)
Patient is returning call to discuss lab results. 

## 2022-10-01 NOTE — Telephone Encounter (Signed)
William Stade, MD 09/30/2022  9:45 AM EDT     Cr stable BNP better continue current dosing of lasix    Left the pt a message to call the office back.

## 2022-10-02 NOTE — Telephone Encounter (Signed)
Left message for patient to call back  

## 2022-10-02 NOTE — Telephone Encounter (Signed)
Pt is returning call.  

## 2022-10-03 NOTE — Telephone Encounter (Signed)
Patient called back. Patient given results of lab work. Patient verbalized understanding.

## 2022-10-07 DIAGNOSIS — H353113 Nonexudative age-related macular degeneration, right eye, advanced atrophic without subfoveal involvement: Secondary | ICD-10-CM | POA: Diagnosis not present

## 2022-10-14 ENCOUNTER — Ambulatory Visit: Payer: Medicare Other | Attending: Cardiovascular Disease | Admitting: Cardiovascular Disease

## 2022-10-14 ENCOUNTER — Encounter: Payer: Self-pay | Admitting: Cardiovascular Disease

## 2022-10-14 VITALS — BP 120/64 | HR 61 | Ht 67.0 in | Wt 148.4 lb

## 2022-10-14 DIAGNOSIS — I4819 Other persistent atrial fibrillation: Secondary | ICD-10-CM | POA: Diagnosis not present

## 2022-10-14 DIAGNOSIS — I442 Atrioventricular block, complete: Secondary | ICD-10-CM | POA: Insufficient documentation

## 2022-10-14 NOTE — Progress Notes (Signed)
  Electrophysiology Office Note:    Date:  10/14/2022   ID:  [name], DOB [birth year], MRN [id number]  PCP:  Natalia Leatherwood, DO   [facility] Providers Cardiologist:  Charlton Haws, MD Electrophysiologist:  Hillis Range, MD (Inactive)     Referring MD: Natalia Leatherwood, DO   History of Present Illness:    [name] is a 89 y.o. male with a medical history significant for CAD status post CABG in 1999, complete heart block, who presents for routine electrophysiology follow-up     He has a [employer] dual-chamber pacemaker implanted in 2019 for complete heart block.  The device is programmed VVI in the setting of permanent atrial fibrillation.     Interval history  Since his last clinic visit with EP, he has been doing reasonably well. he has no device related complaints -- no new tenderness, drainage, redness.  EKGs/Labs/Other Studies Reviewed Today:    Echocardiogram:  TTE 08/27/2022 EF 50-55%, severely dilated LA; mild LVH; mild-mod MR   Monitors:   Stress testing:   Advanced imaging:   Cardiac catherization   EKG:         Physical Exam:    VS:  BP 120/64   Pulse 61   Ht 5\' 7"  (1.702 m)   Wt 148 lb 6.4 oz (67.3 kg)   SpO2 98%   BMI 23.24 kg/m     Wt Readings from Last 3 Encounters:  10/14/22 148 lb 6.4 oz (67.3 kg)  09/25/22 145 lb (65.8 kg)  09/17/22 141 lb (64 kg)     GEN: Well nourished, well developed in no acute distress CARDIAC: RRR, no murmurs, rubs, gallops The device site is normal -- no tenderness, edema, drainage, redness, threatened erosion. RESPIRATORY:  Normal work of breathing MUSCULOSKELETAL: no edema    ASSESSMENT & PLAN:    St. Jude dual chamber pacemaker I reviewed the device interrogation today in detail.  See Paceart for report.  Complete heart block St Jude pacemaker functioning normally He is 100% V-paced  Permanent atrial fibrillation Device is programmed VVI Continue apixaban 2.5 BID for  stroke prevention -- labs 5/30 reviewed    Signed, Maurice Small, MD  10/14/2022 10:24 AM    [facility]

## 2022-10-14 NOTE — Patient Instructions (Signed)
Medication Instructions:  Your physician recommends that you continue on your current medications as directed. Please refer to the Current Medication list given to you today. *If you need a refill on your cardiac medications before your next appointment, please call your pharmacy*   Follow-Up: At [facility], you and your health needs are our priority.  As part of our continuing mission to provide you with exceptional heart care, we have created designated Provider Care Teams.  These Care Teams include your primary Cardiologist (physician) and Advanced Practice Providers (APPs -  Physician Assistants and Nurse Practitioners) who all work together to provide you with the care you need, when you need it.  We recommend signing up for the patient portal called "MyChart".  Sign up information is provided on this After Visit Summary.  MyChart is used to connect with patients for Virtual Visits (Telemedicine).  Patients are able to view lab/test results, encounter notes, upcoming appointments, etc.  Non-urgent messages can be sent to your provider as well.   To learn more about what you can do with MyChart, go to [email or web address].    Your next appointment:   1 year(s)  Provider:   Augustus Mealor, MD  

## 2022-10-17 ENCOUNTER — Other Ambulatory Visit: Payer: Self-pay

## 2022-10-17 DIAGNOSIS — I10 Essential (primary) hypertension: Secondary | ICD-10-CM

## 2022-10-17 MED ORDER — AMLODIPINE BESYLATE 5 MG PO TABS
5.0000 mg | ORAL_TABLET | Freq: Every day | ORAL | 0 refills | Status: DC
Start: 2022-10-17 — End: 2023-01-05

## 2022-10-17 NOTE — Telephone Encounter (Signed)
Refill sent.

## 2022-10-17 NOTE — Telephone Encounter (Signed)
Prescription Request  10/17/2022  LOV: Visit date not found  What is the name of the medication or equipment?  amLODipine (NORVASC) 5 MG tablet  Have you contacted your pharmacy to request a refill? Yes  - NO REFILLS remaining; changing pharmacy locations, was told to call here.  Patient has 3 pills left.   Which pharmacy would you like this sent to?  [facility]   Patient notified that their request is being sent to the clinical staff for review and that they should receive a response within 2 business days.   Please advise at [facility] [phone]

## 2022-10-20 ENCOUNTER — Other Ambulatory Visit: Payer: Self-pay | Admitting: Family Medicine

## 2022-10-20 DIAGNOSIS — I10 Essential (primary) hypertension: Secondary | ICD-10-CM

## 2022-10-23 MED ORDER — DICYCLOMINE HCL 10 MG PO CAPS: 10.0000 mg | ORAL_CAPSULE | Freq: Four times a day (QID) | ORAL | 3 refills | Status: DC | PRN

## 2022-10-23 NOTE — Telephone Encounter (Signed)
RF request for bentyl LOV: 07/22/22 Next ov: 01/06/23 Last written: 02/02/22   Appears medication was discontinued after discharge on 08/2021. Meds pending

## 2022-10-28 ENCOUNTER — Ambulatory Visit: Payer: Medicare Other

## 2022-10-28 DIAGNOSIS — I442 Atrioventricular block, complete: Secondary | ICD-10-CM | POA: Diagnosis not present

## 2022-10-28 LAB — CUP PACEART REMOTE DEVICE CHECK
Battery Remaining Longevity: 57 mo
Battery Remaining Percentage: 49 %
Battery Voltage: 2.98 V
Brady Statistic RV Percent Paced: 99 %
Date Time Interrogation Session: 20240702030329
Implantable Lead Connection Status: 753985
Implantable Lead Connection Status: 753985
Implantable Lead Implant Date: 20190402
Implantable Lead Implant Date: 20190402
Implantable Lead Location: 753859
Implantable Lead Location: 753860
Implantable Pulse Generator Implant Date: 20190402
Lead Channel Impedance Value: 430 Ohm
Lead Channel Pacing Threshold Amplitude: 0.5 V
Lead Channel Pacing Threshold Pulse Width: 0.5 ms
Lead Channel Sensing Intrinsic Amplitude: 9.1 mV
Lead Channel Setting Pacing Amplitude: 2.5 V
Lead Channel Setting Pacing Pulse Width: 0.5 ms
Lead Channel Setting Sensing Sensitivity: 2 mV
Pulse Gen Model: 2272
Pulse Gen Serial Number: 9003454

## 2022-11-04 DIAGNOSIS — H353221 Exudative age-related macular degeneration, left eye, with active choroidal neovascularization: Secondary | ICD-10-CM | POA: Diagnosis not present

## 2022-11-04 DIAGNOSIS — H35033 Hypertensive retinopathy, bilateral: Secondary | ICD-10-CM | POA: Diagnosis not present

## 2022-11-04 DIAGNOSIS — H43813 Vitreous degeneration, bilateral: Secondary | ICD-10-CM | POA: Diagnosis not present

## 2022-11-04 DIAGNOSIS — H35373 Puckering of macula, bilateral: Secondary | ICD-10-CM | POA: Diagnosis not present

## 2022-11-21 NOTE — Progress Notes (Signed)
Remote pacemaker transmission.   

## 2022-12-03 DIAGNOSIS — L821 Other seborrheic keratosis: Secondary | ICD-10-CM | POA: Diagnosis not present

## 2022-12-03 DIAGNOSIS — C44622 Squamous cell carcinoma of skin of right upper limb, including shoulder: Secondary | ICD-10-CM | POA: Diagnosis not present

## 2022-12-03 DIAGNOSIS — L812 Freckles: Secondary | ICD-10-CM | POA: Diagnosis not present

## 2022-12-03 DIAGNOSIS — L570 Actinic keratosis: Secondary | ICD-10-CM | POA: Diagnosis not present

## 2022-12-03 DIAGNOSIS — D485 Neoplasm of uncertain behavior of skin: Secondary | ICD-10-CM | POA: Diagnosis not present

## 2022-12-03 DIAGNOSIS — Z85828 Personal history of other malignant neoplasm of skin: Secondary | ICD-10-CM | POA: Diagnosis not present

## 2022-12-08 DIAGNOSIS — Z85828 Personal history of other malignant neoplasm of skin: Secondary | ICD-10-CM | POA: Diagnosis not present

## 2022-12-08 DIAGNOSIS — C44622 Squamous cell carcinoma of skin of right upper limb, including shoulder: Secondary | ICD-10-CM | POA: Diagnosis not present

## 2022-12-11 ENCOUNTER — Encounter (INDEPENDENT_AMBULATORY_CARE_PROVIDER_SITE_OTHER): Payer: Self-pay

## 2022-12-18 ENCOUNTER — Other Ambulatory Visit: Payer: Self-pay | Admitting: Cardiovascular Disease

## 2022-12-18 DIAGNOSIS — I4819 Other persistent atrial fibrillation: Secondary | ICD-10-CM

## 2022-12-18 NOTE — Telephone Encounter (Signed)
Pt last saw Dr Nelly Laurence 10/14/22, last labs 09/29/22 Creat 1.95, age 89, weight 67.3kg, based on specified criteria pt is on appropriate dosage of Eliquis 2.5mg  BID for afib.  We will refill rx.

## 2022-12-23 DIAGNOSIS — E1151 Type 2 diabetes mellitus with diabetic peripheral angiopathy without gangrene: Secondary | ICD-10-CM | POA: Diagnosis not present

## 2022-12-23 DIAGNOSIS — I739 Peripheral vascular disease, unspecified: Secondary | ICD-10-CM | POA: Diagnosis not present

## 2022-12-23 DIAGNOSIS — L84 Corns and callosities: Secondary | ICD-10-CM | POA: Diagnosis not present

## 2022-12-23 DIAGNOSIS — L603 Nail dystrophy: Secondary | ICD-10-CM | POA: Diagnosis not present

## 2022-12-29 ENCOUNTER — Encounter (HOSPITAL_COMMUNITY): Payer: Self-pay

## 2022-12-29 ENCOUNTER — Other Ambulatory Visit: Payer: Self-pay

## 2022-12-29 ENCOUNTER — Emergency Department (HOSPITAL_COMMUNITY): Payer: Medicare Other

## 2022-12-29 ENCOUNTER — Emergency Department (HOSPITAL_COMMUNITY)
Admission: EM | Admit: 2022-12-29 | Discharge: 2022-12-29 | Disposition: A | Discharge status: Home / Self Care | Payer: Medicare Other | Attending: Emergency Medicine | Admitting: Emergency Medicine

## 2022-12-29 DIAGNOSIS — I509 Heart failure, unspecified: Secondary | ICD-10-CM | POA: Diagnosis not present

## 2022-12-29 DIAGNOSIS — R918 Other nonspecific abnormal finding of lung field: Secondary | ICD-10-CM | POA: Diagnosis not present

## 2022-12-29 DIAGNOSIS — R0609 Other forms of dyspnea: Secondary | ICD-10-CM | POA: Insufficient documentation

## 2022-12-29 DIAGNOSIS — I517 Cardiomegaly: Secondary | ICD-10-CM | POA: Diagnosis not present

## 2022-12-29 DIAGNOSIS — R609 Edema, unspecified: Secondary | ICD-10-CM | POA: Diagnosis not present

## 2022-12-29 DIAGNOSIS — Z79899 Other long term (current) drug therapy: Secondary | ICD-10-CM | POA: Insufficient documentation

## 2022-12-29 DIAGNOSIS — Z7901 Long term (current) use of anticoagulants: Secondary | ICD-10-CM | POA: Insufficient documentation

## 2022-12-29 DIAGNOSIS — R0600 Dyspnea, unspecified: Secondary | ICD-10-CM | POA: Diagnosis not present

## 2022-12-29 DIAGNOSIS — R0602 Shortness of breath: Secondary | ICD-10-CM | POA: Diagnosis not present

## 2022-12-29 LAB — CBC
HCT: 31.2 % — ABNORMAL LOW (ref 39.0–52.0)
Hemoglobin: 10 g/dL — ABNORMAL LOW (ref 13.0–17.0)
MCH: 30.3 pg (ref 26.0–34.0)
MCHC: 32.1 g/dL (ref 30.0–36.0)
MCV: 94.5 fL (ref 80.0–100.0)
Platelets: 126 10*3/uL — ABNORMAL LOW (ref 150–400)
RBC: 3.3 MIL/uL — ABNORMAL LOW (ref 4.22–5.81)
RDW: 18.6 % — ABNORMAL HIGH (ref 11.5–15.5)
WBC: 5.7 10*3/uL (ref 4.0–10.5)
nRBC: 0 % (ref 0.0–0.2)

## 2022-12-29 LAB — COMPREHENSIVE METABOLIC PANEL
ALT: 26 U/L (ref 0–44)
AST: 30 U/L (ref 15–41)
Albumin: 2.9 g/dL — ABNORMAL LOW (ref 3.5–5.0)
Alkaline Phosphatase: 76 U/L (ref 38–126)
Anion gap: 11 (ref 5–15)
BUN: 66 mg/dL — ABNORMAL HIGH (ref 8–23)
CO2: 20 mmol/L — ABNORMAL LOW (ref 22–32)
Calcium: 8.3 mg/dL — ABNORMAL LOW (ref 8.9–10.3)
Chloride: 107 mmol/L (ref 98–111)
Creatinine, Ser: 2.59 mg/dL — ABNORMAL HIGH (ref 0.61–1.24)
GFR, Estimated: 23 mL/min — ABNORMAL LOW (ref >60)
Glucose, Bld: 141 mg/dL — ABNORMAL HIGH (ref 70–99)
Potassium: 3.9 mmol/L (ref 3.5–5.1)
Sodium: 138 mmol/L (ref 135–145)
Total Bilirubin: 0.8 mg/dL (ref 0.3–1.2)
Total Protein: 6.6 g/dL (ref 6.5–8.1)

## 2022-12-29 LAB — BRAIN NATRIURETIC PEPTIDE: B Natriuretic Peptide: 1768.2 pg/mL — ABNORMAL HIGH (ref 0.0–100.0)

## 2022-12-29 MED ORDER — FUROSEMIDE 40 MG PO TABS: ORAL_TABLET | ORAL | 3 refills | Status: DC

## 2022-12-29 MED ORDER — FUROSEMIDE 10 MG/ML IJ SOLN
80.0000 mg | Freq: Once | INTRAMUSCULAR | Status: AC
  Administered 2022-12-29: 80 mg via INTRAVENOUS
  Filled 2022-12-29: qty 8

## 2022-12-29 NOTE — ED Notes (Signed)
Discharge instructions reviewed with pt and family. Pt/family verbalized understanding. Pt a&ox4

## 2022-12-29 NOTE — ED Provider Notes (Signed)
[place] EMERGENCY DEPARTMENT AT [facility] Provider Note   CSN: [id number] Arrival date & time: 12/29/22  1849     History  Chief Complaint  Patient presents with   Shortness of Breath    [name] is a 89 y.o. male.  This is a 89 year old male who is here today for lower extremity edema and dyspnea.  This has been worsening over the last few days.  He does have a history of heart failure.   Shortness of Breath      Home Medications Prior to Admission medications   Medication Sig Start Date End Date Taking? Authorizing Provider  allopurinol (ZYLOPRIM) 300 MG tablet Take 0.5 tablets (150 mg total) by mouth daily. 01/20/22   Kuneff, Renee A, DO  amLODipine (NORVASC) 5 MG tablet Take 1 tablet (5 mg total) by mouth at bedtime. 10/17/22   Kuneff, Renee A, DO  atorvastatin (LIPITOR) 20 MG tablet Take 1 tablet (20 mg total) by mouth at bedtime. 07/22/22   Kuneff, Renee A, DO  carvedilol (COREG) 6.25 MG tablet Take 1 tablet (6.25 mg total) by mouth 2 (two) times daily with a meal. 07/22/22   Kuneff, Renee A, DO  Cholecalciferol 125 MCG (5000 UT) capsule Take 5,000 Units by mouth daily.    [provider]  dicyclomine (BENTYL) 10 MG capsule Take 1 capsule (10 mg total) by mouth every 6 (six) hours as needed. 10/23/22   Kuneff, Renee A, DO  ELIQUIS 2.5 MG TABS tablet TAKE 1 TABLET BY MOUTH TWICE A DAY 12/18/22   Mealor, Roberts Gaudy, MD  finasteride (PROSCAR) 5 MG tablet Take 5 mg by mouth daily. 08/24/17   [provider]  fish oil-omega-3 fatty acids 1000 MG capsule Take 1 g by mouth daily.     [provider]  fluticasone (FLONASE) 50 MCG/ACT nasal spray Place 2 sprays into both nostrils daily. 02/13/22   [provider]  furosemide (LASIX) 40 MG tablet Take 1 tablet in the morning and 1/2 tablet in the evening 12/29/22   Anders Simmonds T, DO  glucose blood (ONETOUCH VERIO) test strip Use to check sugar daily as instructed. 09/25/22    Kuneff, Renee A, DO  Lancets (ONETOUCH ULTRASOFT) lancets Use to check sugar daily as instructed. 09/25/22   Kuneff, Renee A, DO  latanoprost (XALATAN) 0.005 % ophthalmic solution Place 1 drop into both eyes at bedtime. 07/14/18   [provider]  Multiple Vitamins-Minerals (PRESERVISION AREDS PO) Take 1 tablet by mouth 2 (two) times daily.     [provider]  potassium chloride (KLOR-CON) 10 MEQ tablet Take 1 tablet (10 mEq total) by mouth daily. 07/22/22   Kuneff, Renee A, DO  Probiotic Product (PROBIOTIC PO) Take 1 tablet by mouth daily.    [provider]  tamsulosin (FLOMAX) 0.4 MG CAPS capsule Take 0.4 mg by mouth in the morning and at bedtime.    [provider]  timolol (TIMOPTIC) 0.5 % ophthalmic solution Place 1 drop into both eyes 2 (two) times daily.  10/06/18   [provider]  UNABLE TO FIND Take 2 tablets by mouth every morning. Med Name: [name] (takes place of Co Q 10)    [provider]      Allergies    Lisinopril, Penicillins, and Vancomycin    Review of Systems   Review of Systems  Respiratory:  Positive for shortness of breath.     Physical Exam Updated Vital Signs BP Marland Kitchen)  140/63 (BP Location: Left Arm)   Pulse 60   Temp 97.7 F (36.5 C) (Oral)   Resp 20   Ht 5\' 7"  (1.702 m)   Wt 71.7 kg   SpO2 100%   BMI 24.75 kg/m  Physical Exam Cardiovascular:     Comments: 2+ bilateral lower extremity edema Pulmonary:     Effort: Pulmonary effort is normal. No tachypnea or respiratory distress.     Breath sounds: Examination of the left-upper field reveals decreased breath sounds. Examination of the left-middle field reveals decreased breath sounds. Examination of the left-lower field reveals decreased breath sounds. Decreased breath sounds present. No wheezing.  Neurological:     Mental Status: He is alert.     ED Results / Procedures / Treatments   Labs (all labs ordered are listed, but only abnormal results are  displayed) Labs Reviewed  CBC - Abnormal; Notable for the following components:      Result Value   RBC 3.30 (*)    Hemoglobin 10.0 (*)    HCT 31.2 (*)    RDW 18.6 (*)    Platelets 126 (*)    All other components within normal limits  COMPREHENSIVE METABOLIC PANEL - Abnormal; Notable for the following components:   CO2 20 (*)    Glucose, Bld 141 (*)    BUN 66 (*)    Creatinine, Ser 2.59 (*)    Calcium 8.3 (*)    Albumin 2.9 (*)    GFR, Estimated 23 (*)    All other components within normal limits  BRAIN NATRIURETIC PEPTIDE - Abnormal; Notable for the following components:   B Natriuretic Peptide 5,188.4 (*)    All other components within normal limits    EKG EKG Interpretation Date/Time:  Monday December 29 2022 19:01:42 EDT Ventricular Rate:  60 PR Interval:    QRS Duration:  181 QT Interval:  512 QTC Calculation: 512 R Axis:   246  Text Interpretation: Junctional rhythm Right bundle branch block Confirmed by Anders Simmonds [phone]) on 12/29/2022 9:05:27 PM  Radiology DG Chest 2 View  Result Date: 12/29/2022 CLINICAL DATA:  Shortness of breath EXAM: CHEST - 2 VIEW COMPARISON:  05/13/2022 FINDINGS: Cardiomegaly. Prior CABG. Left pacer remains in place, unchanged. Moderate left pleural effusion and small right pleural effusion, stable since prior study. Left lower lobe atelectasis or infiltrate. Findings are similar prior study. IMPRESSION: Continued bilateral pleural effusions, left greater than right with left lower lobe atelectasis or infiltrate. Cardiomegaly. Electronically Signed   By: Charlett Nose M.D.   On: 12/29/2022 20:17    Procedures Procedures    Medications Ordered in ED Medications  furosemide (LASIX) injection 80 mg (80 mg Intravenous Given 12/29/22 2000)    ED Course/ Medical Decision Making/ A&P                                 Medical Decision Making This is a 89 year old male who is here today for increased lower extremity edema and shortness of  breath.  Differential diagnoses include heart failure, pulmonary edema, pleural effusion.  Plan-my dependent review the patient's chest x-ray shows left-sided pleural effusion.  It looks as though he had this in January of this past year.  Patient has not required any supplemental O2 at this time.  Will provide him with some additional IV Lasix and reassess.  Reassessment-patient's creatinine mildly elevated compared to prior.  He did have good response to  Lasix.  Patient's son, daughter and son-in-law are at bedside.  I discussed admission versus discharge with increased Lasix, PCP follow-up.  The patient's PCP is out of town until next Monday.  I discussed risk and benefits of going home with an increase Lasix dose versus overnight admission with IV diuresis.  The patient, and the family feel comfortable the patient going home.  I discussed return precautions at length with the family which included increased difficulty with breathing, pain in the chest, or worsening swelling in the legs.  They will return if any of these symptoms present, and will follow-up with his PCP on Monday.  Amount and/or Complexity of Data Reviewed Labs: ordered. Radiology: ordered.  Risk Prescription drug management.           Final Clinical Impression(s) / ED Diagnoses Final diagnoses:  Dyspnea on exertion    Rx / DC Orders ED Discharge Orders          Ordered    furosemide (LASIX) 40 MG tablet        12/29/22 2104              Anders Simmonds T, DO 12/29/22 2120

## 2022-12-29 NOTE — ED Triage Notes (Signed)
Pt bib [facility] coming from home w/ c/o worsening SOB that's been occurring for the past few days w/ exertion. Hx of CHF. Pt takes lasix but says pt has swelling in abdomen and legs. Pt states he does not wear O2 at home. EMS reports pt 93% on RA, 97% on 2L.  EMS vital signs: 142/76 Cbg-149 60 HR, Pacemaker

## 2022-12-29 NOTE — ED Notes (Signed)
Patient transported to X-ray 

## 2022-12-29 NOTE — Discharge Instructions (Signed)
I would like you to start taking 60 mg of Lasix in the morning, and 60 mg of Lasix in the evening for the next 3 days.  After that, you may resume your usual Lasix dosing of 40 mg in the morning and 20 mg in the evening.  Please call your primary care doctor tomorrow to set up an appointment for Monday.  Like we discussed, you develop any worsening shortness of breath, or worsening swelling in your legs, please return to the emergency department immediately.  It was very nice to meet you all.

## 2023-01-06 ENCOUNTER — Encounter: Payer: Self-pay | Admitting: Family Medicine

## 2023-01-06 ENCOUNTER — Ambulatory Visit (INDEPENDENT_AMBULATORY_CARE_PROVIDER_SITE_OTHER): Payer: Medicare Other | Admitting: Family Medicine

## 2023-01-06 VITALS — BP 138/54 | HR 65 | Temp 97.4°F | Wt 146.2 lb

## 2023-01-06 DIAGNOSIS — I25708 Atherosclerosis of coronary artery bypass graft(s), unspecified, with other forms of angina pectoris: Secondary | ICD-10-CM | POA: Diagnosis not present

## 2023-01-06 DIAGNOSIS — I10 Essential (primary) hypertension: Secondary | ICD-10-CM

## 2023-01-06 DIAGNOSIS — N1831 Chronic kidney disease, stage 3a: Secondary | ICD-10-CM | POA: Diagnosis not present

## 2023-01-06 DIAGNOSIS — I5033 Acute on chronic diastolic (congestive) heart failure: Secondary | ICD-10-CM | POA: Diagnosis not present

## 2023-01-06 DIAGNOSIS — N184 Chronic kidney disease, stage 4 (severe): Secondary | ICD-10-CM

## 2023-01-06 DIAGNOSIS — R7303 Prediabetes: Secondary | ICD-10-CM

## 2023-01-06 DIAGNOSIS — C673 Malignant neoplasm of anterior wall of bladder: Secondary | ICD-10-CM

## 2023-01-06 DIAGNOSIS — Z23 Encounter for immunization: Secondary | ICD-10-CM

## 2023-01-06 DIAGNOSIS — D500 Iron deficiency anemia secondary to blood loss (chronic): Secondary | ICD-10-CM

## 2023-01-06 DIAGNOSIS — E559 Vitamin D deficiency, unspecified: Secondary | ICD-10-CM

## 2023-01-06 DIAGNOSIS — D631 Anemia in chronic kidney disease: Secondary | ICD-10-CM

## 2023-01-06 DIAGNOSIS — K580 Irritable bowel syndrome with diarrhea: Secondary | ICD-10-CM

## 2023-01-06 DIAGNOSIS — I480 Paroxysmal atrial fibrillation: Secondary | ICD-10-CM | POA: Diagnosis not present

## 2023-01-06 DIAGNOSIS — M109 Gout, unspecified: Secondary | ICD-10-CM

## 2023-01-06 DIAGNOSIS — D6869 Other thrombophilia: Secondary | ICD-10-CM

## 2023-01-06 DIAGNOSIS — E785 Hyperlipidemia, unspecified: Secondary | ICD-10-CM | POA: Diagnosis not present

## 2023-01-06 MED ORDER — AMLODIPINE BESYLATE 5 MG PO TABS
5.0000 mg | ORAL_TABLET | Freq: Every day | ORAL | 1 refills | Status: DC
Start: 2023-01-06 — End: 2023-05-05

## 2023-01-06 MED ORDER — ALLOPURINOL 300 MG PO TABS: 150.0000 mg | ORAL_TABLET | Freq: Every day | ORAL | 3 refills | Status: DC

## 2023-01-06 MED ORDER — CARVEDILOL 6.25 MG PO TABS: 6.2500 mg | ORAL_TABLET | Freq: Two times a day (BID) | ORAL | 1 refills | Status: DC

## 2023-01-06 NOTE — Patient Instructions (Addendum)

## 2023-01-06 NOTE — Progress Notes (Unsigned)
[name] , May 16, 1928, 89 y.o., male MRN: [id number] Patient Care Team    Relationship Specialty Notifications Start End  Natalia Leatherwood, DO PCP - General Family Medicine  12/29/22   Wendall Stade, MD PCP - Cardiology Cardiology  03/17/17   Hillis Range, MD (Inactive) PCP - Electrophysiology Cardiology  06/26/21   Janet Berlin, MD Consulting Physician Ophthalmology  12/18/15   Mathews Robinsons, MD Consulting Physician Dermatology  12/18/15   Wendall Stade, MD Consulting Physician Cardiology  12/18/15   Stephannie Li, MD Consulting Physician Ophthalmology  12/19/15   Josph Macho, MD Consulting Physician Oncology  09/30/16   Arita Miss, MD Attending Physician Nephrology  09/30/16   Donzetta Starch, MD Consulting Physician Dermatology  09/30/16   Marcine Matar, MD Consulting Physician Urology  06/30/19   Merwyn Katos, DPM Consulting Physician Podiatry  06/30/19     Chief Complaint  Patient presents with   Medical Management of Chronic Issues    Lasix changed to BID on 09/02    Subjective: [name] is a 89 y.o. male present for [facility] Hypertension//A.Fib/IDA/chronic anticoagulation Patient reports compliance with  Coreg 6.25  twice a day and amlodipine 5 mg QD.  Patient was seen in the emergency room 12/29/2022 for dyspnea on exertion found to be fluid overloaded.  Diuresed with IV Lasix and sent home on higher dose Lasix.  Patient reports he is feeling much improved as far as his breathing is concerned.  He still endorses having more edema in his lower extremities than he is used to.  He also reports having some fatigue feeling in his legs, but has been completing exercises to help strengthen his legs. Diet: Low sodium diet followed Exercise: very active RF: CKD3, HLD, CAD w/ CABG (1999), cardiac cath 2011 2/2 CP (all grafts normal), PVD, Afib Significant cardiac history of CAD with prior CABG in 1999 -LIMA to the LAD, SVG to IM, OM1, OM 2, SVG to D1, SVG to PDA/PLA, EF  45-50% with mild diffuse hypokinesis. Grafts patent by cath 2011   Nuclear stress test 08/2015 scar, no ischemia LVEF 70% also has atrial fibrillation/flutter in 2014 on Eliquis status post [facility] 10/2014, PVD, claudication.  Cardioversion 04/01/2017.  Pacemaker implant 07/28/2017. 08/26/2022 echocardiogram: Ejection fraction reported as low normal (left ventricular EF 50-55% %) with mild/moderate mitral regurg.  Severe tricuspid regurg from A-fib. Right ventricular volume overloaded Right ventricular systolic function mildly reduced, right ventricular size is mildly enlarged. Moderately elevated pulmonary artery systolic pressure with pulmonic valve regurgitation moderate Left atrium severely dilated Right atrial size moderately dilated Patient has a moderately sized PFO with left-to-right shunting present.  BPH/cancer anterior wall of urinary bladder: Managed by oncology Prediabetes:  He has been a prediabetic -has been controlled with diet alone Gout: Patient reports gout is well-controlled on allopurinol 150 mg daily.  Renally dosed CKD/vitamin D deficiency: Patient reports compliance 5000 units vitamin D daily. IBS: Patient reports his IBS is well-controlled-but he does have take the Bentyl as needed.  Allergies  Allergen Reactions   Lisinopril Other (See Comments)     he has difficulty controlling potassium on ace/arb- lisinopril was dc'd in the past 2/2 to hyperkalemia documented by [facility] PCP 01/2021   Penicillins Hives    Has patient had a PCN reaction causing immediate rash, facial/tongue/throat swelling, SOB or lightheadedness with hypotension: No Has patient had a PCN reaction causing severe rash involving mucus membranes or skin necrosis: Yes Has patient had  a PCN reaction that required hospitalization: No Has patient had a PCN reaction occurring within the last 10 years: No If all of the above answers are "NO", then may proceed with Cephalosporin use.    Vancomycin Other (See  Comments)    Edema and myalgia   Social History   Tobacco Use   Smoking status: Former   Smokeless tobacco: Former    Types: Chew    Quit date: 02/03/1969  Substance Use Topics   Alcohol use: No    Alcohol/week: 0.0 standard drinks of alcohol   Past Medical History:  Diagnosis Date   A-fib (HCC)    Basal cell carcinoma    skin   Bigeminal rhythm    Bradycardia 06/21/2017   Cancer of anterior wall of urinary bladder (HCC) 07/18/2019   Cataract    Chronic renal insufficiency, stage 3 (moderate) (HCC) 2018   GFR 30s-40s   Coronary artery disease    post bypass   CVD (cardiovascular disease)    Diabetes mellitus without complication (HCC)    type 2 diet controlled   Diverticular disease    Easy bruising    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   Gout    Hematuria 06/30/2019   Hernia    Hyperkalemia 05/06/2017   Hyperlipidemia    Hypertension    IBS (irritable bowel syndrome)    Iron deficiency anemia 02/04/2016   Macular degeneration    Microscopic colitis    PVD (peripheral vascular disease) (HCC)    Past Surgical History:  Procedure Laterality Date   CARDIAC CATHETERIZATION  2006   CARDIOVERSION N/A 02/22/2013   Procedure: CARDIOVERSION;  Surgeon: Wendall Stade, MD;  Location: [facility] ENDOSCOPY;  Service: Cardiovascular;  Laterality: N/A;   CARDIOVERSION N/A 11/15/2014   Procedure: CARDIOVERSION;  Surgeon: Wendall Stade, MD;  Location: [name] ENDOSCOPY;  Service: Cardiovascular;  Laterality: N/A;   CARDIOVERSION N/A 04/01/2017   Procedure: CARDIOVERSION;  Surgeon: Laurey Morale, MD;  Location: [facility] ENDOSCOPY;  Service: Cardiovascular;  Laterality: N/A;   CAROTID ENDARTERECTOMY  2009/ 1993   left/ right   COLONOSCOPY  03/17/2005   The colon is normal.   CORONARY ARTERY BYPASS GRAFT  1999   CYSTOSCOPY W/ RETROGRADES Bilateral 07/14/2019   Procedure: CYSTOSCOPY WITH RETROGRADE PYELOGRAM;  Surgeon: Marcine Matar, MD;  Location: [facility];  Service:  Urology;  Laterality: Bilateral;   ESOPHAGOGASTRODUODENOSCOPY  03/17/2005   Normal esophagus. Normal Stomanch. Normal duodenum.    EYE SURGERY     eyelid repair   INGUINAL HERNIA REPAIR  02/11/2012   Procedure: LAPAROSCOPIC BILATERAL INGUINAL HERNIA REPAIR;  Surgeon: Valarie Merino, MD;  Location: [facility] ORS;  Service: General;  Laterality: Bilateral;   PACEMAKER IMPLANT N/A 07/28/2017   [employer] Assurity MRI conditional  dual-chamber pacemaker for symptomatic second degree AV block by Dr Johney Frame   SKIN CANCER EXCISION     right ear x 3   TRANSURETHRAL RESECTION OF BLADDER TUMOR N/A 09/05/2019   Procedure: TRANSURETHRAL RESECTION OF BLADDER TUMOR (TURBT);  Surgeon: Marcine Matar, MD;  Location: [facility];  Service: Urology;  Laterality: N/A;   TRANSURETHRAL RESECTION OF BLADDER TUMOR WITH MITOMYCIN-C N/A 07/14/2019   Procedure: TRANSURETHRAL RESECTION OF BLADDER TUMOR WITH GEMCITABINE IN PACU;  Surgeon: Marcine Matar, MD;  Location: [facility];  Service: Urology;  Laterality: N/A;   Family History  Problem Relation Age of Onset   Stroke Mother    Coronary artery disease Father  Heart disease Father    Melanoma Sister    Colon cancer Neg Hx    Allergies as of 01/06/2023       Reactions   Lisinopril Other (See Comments)    he has difficulty controlling potassium on ace/arb- lisinopril was dc'd in the past 2/2 to hyperkalemia documented by [facility] PCP 01/2021   Penicillins Hives   Has patient had a PCN reaction causing immediate rash, facial/tongue/throat swelling, SOB or lightheadedness with hypotension: No Has patient had a PCN reaction causing severe rash involving mucus membranes or skin necrosis: Yes Has patient had a PCN reaction that required hospitalization: No Has patient had a PCN reaction occurring within the last 10 years: No If all of the above answers are "NO", then may proceed with Cephalosporin use.   Vancomycin Other (See  Comments)   Edema and myalgia        Medication List        Accurate as of January 06, 2023 11:59 PM. If you have any questions, ask your nurse or doctor.          allopurinol 300 MG tablet Commonly known as: ZYLOPRIM Take 0.5 tablets (150 mg total) by mouth daily.   amLODipine 5 MG tablet Commonly known as: NORVASC Take 1 tablet (5 mg total) by mouth at bedtime.   atorvastatin 20 MG tablet Commonly known as: LIPITOR Take 1 tablet (20 mg total) by mouth at bedtime.   carvedilol 6.25 MG tablet Commonly known as: COREG Take 1 tablet (6.25 mg total) by mouth 2 (two) times daily with a meal.   Cholecalciferol 125 MCG (5000 UT) capsule Take 5,000 Units by mouth daily.   dicyclomine 10 MG capsule Commonly known as: BENTYL Take 1 capsule (10 mg total) by mouth every 6 (six) hours as needed.   Eliquis 2.5 MG Tabs tablet Generic drug: apixaban TAKE 1 TABLET BY MOUTH TWICE A DAY   finasteride 5 MG tablet Commonly known as: PROSCAR Take 5 mg by mouth daily.   fish oil-omega-3 fatty acids 1000 MG capsule Take 1 g by mouth daily.   fluticasone 50 MCG/ACT nasal spray Commonly known as: FLONASE Place 2 sprays into both nostrils daily.   furosemide 40 MG tablet Commonly known as: LASIX Take 1 tablet in the morning and 1/2 tablet in the evening What changed: additional instructions   latanoprost 0.005 % ophthalmic solution Commonly known as: XALATAN Place 1 drop into both eyes at bedtime.   onetouch ultrasoft lancets Use to check sugar daily as instructed.   OneTouch Verio test strip Generic drug: glucose blood Use to check sugar daily as instructed.   potassium chloride 10 MEQ tablet Commonly known as: KLOR-CON Take 1 tablet (10 mEq total) by mouth daily.   PRESERVISION AREDS PO Take 1 tablet by mouth 2 (two) times daily.   PROBIOTIC PO Take 1 tablet by mouth daily.   tamsulosin 0.4 MG Caps capsule Commonly known as: FLOMAX Take 0.4 mg by mouth in  the morning and at bedtime.   timolol 0.5 % ophthalmic solution Commonly known as: TIMOPTIC Place 1 drop into both eyes 2 (two) times daily.   UNABLE TO FIND Take 2 tablets by mouth every morning. Med Name: [name] (takes place of Co Q 10)        No results found for this or any previous visit (from the past 24 hour(s)).  No results found.   ROS: Negative, with the exception of above mentioned in HPI  Objective:  BP (!) 138/54   Pulse 65   Temp (!) 97.4 F (36.3 C)   Wt 146 lb 3.2 oz (66.3 kg)   SpO2 98%   BMI 22.90 kg/m  Body mass index is 22.9 kg/m.  Physical Exam Vitals and nursing note reviewed.  Constitutional:      General: He is not in acute distress.    Appearance: Normal appearance. He is not ill-appearing, toxic-appearing or diaphoretic.  HENT:     Head: Normocephalic and atraumatic.     Mouth/Throat:     Mouth: Mucous membranes are moist.  Eyes:     General: No scleral icterus.       Right eye: No discharge.        Left eye: No discharge.     Extraocular Movements: Extraocular movements intact.     Pupils: Pupils are equal, round, and reactive to light.  Cardiovascular:     Rate and Rhythm: Normal rate and regular rhythm.     Heart sounds: Murmur heard.  Pulmonary:     Effort: Pulmonary effort is normal. No respiratory distress.     Breath sounds: Normal breath sounds. No wheezing, rhonchi or rales.  Musculoskeletal:     Cervical back: Neck supple.     Right lower leg: No edema.     Left lower leg: No edema.  Lymphadenopathy:     Cervical: No cervical adenopathy.  Skin:    General: Skin is warm.     Findings: No rash.  Neurological:     Mental Status: He is alert and oriented to person, place, and time. Mental status is at baseline.  Psychiatric:        Mood and Affect: Mood normal.        Behavior: Behavior normal.        Thought Content: Thought content normal.        Judgment: Judgment normal.    Results for orders placed or performed  in visit on 01/06/23 (from the past 72 hour(s))  Basic Metabolic Panel (BMET)     Status: Abnormal   Collection Time: 01/06/23  1:41 PM  Result Value Ref Range   Sodium 138 135 - 145 mEq/L   Potassium 3.9 3.5 - 5.1 mEq/L   Chloride 100 96 - 112 mEq/L   CO2 29 19 - 32 mEq/L   Glucose, Bld 106 (H) 70 - 99 mg/dL   BUN 51 (H) 6 - 23 mg/dL   Creatinine, Ser 1.61 (H) 0.40 - 1.50 mg/dL   GFR 09.60 (L) >45.40 mL/min    Comment: Calculated using the CKD-EPI Creatinine Equation (2021)   Calcium 8.3 (L) 8.4 - 10.5 mg/dL  B Nat Peptide     Status: Abnormal   Collection Time: 01/06/23  1:41 PM  Result Value Ref Range   Pro B Natriuretic peptide (BNP) 1,570.0 (H) 0.0 - 100.0 pg/mL  Hemoglobin A1c     Status: Abnormal   Collection Time: 01/06/23  1:41 PM  Result Value Ref Range   Hgb A1c MFr Bld 6.8 (H) 4.6 - 6.5 %    Comment: Glycemic Control Guidelines for People with Diabetes:Non Diabetic:  <6%Goal of Therapy: <7%Additional Action Suggested:  >8%   Vitamin D (25 hydroxy)     Status: None   Collection Time: 01/06/23  1:41 PM  Result Value Ref Range   VITD 68.02 30.00 - 100.00 ng/mL  PTH, Intact and Calcium     Status: Abnormal   Collection Time: 01/06/23  1:41 PM  Result Value Ref Range   PTH 82 (H) 16 - 77 pg/mL    Comment: . Interpretive Guide    Intact PTH           Calcium ------------------    ----------           ------- Normal Parathyroid    Normal               Normal Hypoparathyroidism    Low or Low Normal    Low Hyperparathyroidism    Primary            Normal or High       High    Secondary          High                 Normal or Low    Tertiary           High                 High Non-Parathyroid    Hypercalcemia      Low or Low Normal    High .    Calcium 8.6 8.6 - 10.3 mg/dL  Uric acid     Status: None   Collection Time: 01/06/23  1:41 PM  Result Value Ref Range   Uric Acid, Serum 6.5 4.0 - 7.8 mg/dL  Direct LDL     Status: None   Collection Time: 01/06/23  1:41 PM   Result Value Ref Range   Direct LDL 64.0 mg/dL    Comment: Optimal:  <782 mg/dLNear or Above Optimal:  100-129 mg/dLBorderline High:  130-159 mg/dLHigh:  160-189 mg/dLVery High:  >190 mg/dL     Assessment/Plan: HOSSAM DEFLORIO is a 89 y.o. male present for OV for  Essential hypertension/Paroxysmal atrial fibrillation (HCC)//HLD//CHF/CAD/PVD BP stable, fluid overloaded.  BMP, BNP and LDL collected today.  proBNP approximately 1700 in ED. Continue amlodipine 5 mg daily Continue to coreg 6.25 mg twice daily. Continue Eliquis>cardio Lasix 40 mg twice daily> new dosage since the visit, now twice daily in place of daily. Increase potassium to K-Dur 20 mg twice daily as long as Lasix remains twice daily. - continue  fish oil supplementation - Low-sodium diet.  -  He BP has been  very difficult to control at times and his hydration and IDA/epo injections cause fluctuations leading to both highs and lower than desired readings. Certainly would rather mildly elevated BP over too low. Continue Dr. Nishan-cardiology follow-ups  Anemias IDA and EPO:  - managed by heme/onc.   Prediabetes Patient has been very well diet controlled 5.7> 6.4> 6.5>5.9>6.5> 6.0> 5.8 >6.0>A1c collected today  Continue dietary modifications and exercise. If A1c increases and GFR maintains above 25 consider adding Farxiga for diabetes, renal and cardiac protection with heart failure and chronic kidney disease diagnoses.  Gout, unspecified cause, unspecified chronicity, unspecified site Stable Continue allopurinol-with new renal dose of 100 mg Continue renally dosed allopurinol Uric acid levels collected today  Hematuria/BPH/bladder cancer: Patient reports his bladder cancer  responded well to treatment.  He is prescribed Flomax and Proscar by urology.  CKD 4: BMP now 23 in ED.  Has ranged between 25-30 typically over the last 6 months. Avoid NSAIDs Renally dose medications PTH/calcium and vitamin D collected  today If GFR remains above 25 consider starting Farxiga for cardiac and renal protection next visit.  Influenza vaccine administered today  Meds managed by primary team: [facility]: Lasix, potassium, Bentyl [facility]: Amlodipine,  Coreg, Lipitor, allopurinol  Orders Placed This Encounter  Procedures   Flu Vaccine Trivalent High Dose (Fluad)   Basic Metabolic Panel (BMET)   B Nat Peptide   Hemoglobin A1c   Vitamin D (25 hydroxy)   PTH, Intact and Calcium   Uric acid   Direct LDL   Meds ordered this encounter  Medications   DISCONTD: allopurinol (ZYLOPRIM) 300 MG tablet    Sig: Take 0.5 tablets (150 mg total) by mouth daily.    Dispense:  45 tablet    Refill:  3   amLODipine (NORVASC) 5 MG tablet    Sig: Take 1 tablet (5 mg total) by mouth at bedtime.    Dispense:  90 tablet    Refill:  1   carvedilol (COREG) 6.25 MG tablet    Sig: Take 1 tablet (6.25 mg total) by mouth 2 (two) times daily with a meal.    Dispense:  180 tablet    Refill:  1    Referral Orders  No referral(s) requested today     electronically signed by:  Felix Pacini, DO  [facility] - OR

## 2023-01-07 LAB — BASIC METABOLIC PANEL
BUN: 51 mg/dL — ABNORMAL HIGH (ref 6–23)
CO2: 29 meq/L (ref 19–32)
Calcium: 8.3 mg/dL — ABNORMAL LOW (ref 8.4–10.5)
Chloride: 100 meq/L (ref 96–112)
Creatinine, Ser: 2 mg/dL — ABNORMAL HIGH (ref 0.40–1.50)
GFR: 28.45 mL/min — ABNORMAL LOW (ref >60.00)
Glucose, Bld: 106 mg/dL — ABNORMAL HIGH (ref 70–99)
Potassium: 3.9 meq/L (ref 3.5–5.1)
Sodium: 138 meq/L (ref 135–145)

## 2023-01-07 LAB — PTH, INTACT AND CALCIUM
Calcium: 8.6 mg/dL (ref 8.6–10.3)
PTH: 82 pg/mL — ABNORMAL HIGH (ref 16–77)

## 2023-01-07 LAB — URIC ACID: Uric Acid, Serum: 6.5 mg/dL (ref 4.0–7.8)

## 2023-01-07 LAB — HEMOGLOBIN A1C: Hgb A1c MFr Bld: 6.8 % — ABNORMAL HIGH (ref 4.6–6.5)

## 2023-01-07 LAB — VITAMIN D 25 HYDROXY (VIT D DEFICIENCY, FRACTURES): VITD: 68.02 ng/mL (ref 30.00–100.00)

## 2023-01-07 LAB — BRAIN NATRIURETIC PEPTIDE: Pro B Natriuretic peptide (BNP): 1570 pg/mL — ABNORMAL HIGH (ref 0.0–100.0)

## 2023-01-07 LAB — LDL CHOLESTEROL, DIRECT: Direct LDL: 64 mg/dL

## 2023-01-08 ENCOUNTER — Telehealth: Payer: Self-pay | Admitting: Family Medicine

## 2023-01-08 MED ORDER — POTASSIUM CHLORIDE ER 20 MEQ PO TBCR: 20.0000 meq | EXTENDED_RELEASE_TABLET | Freq: Two times a day (BID) | ORAL | 1 refills | Status: DC

## 2023-01-08 MED ORDER — ALLOPURINOL 100 MG PO TABS: 100.0000 mg | ORAL_TABLET | Freq: Every day | ORAL | 3 refills | Status: DC

## 2023-01-08 MED ORDER — FUROSEMIDE 40 MG PO TABS: 40.0000 mg | ORAL_TABLET | Freq: Two times a day (BID) | ORAL | 1 refills | Status: DC

## 2023-01-08 NOTE — Telephone Encounter (Signed)
Pt advised of results/recommendations. Follow up scheduled for 9/27

## 2023-01-08 NOTE — Telephone Encounter (Signed)
Please call patient His potassium levels are decreasing with use of more Lasix.  I did call in a refill on potassium at a little higher dose and to be taken twice daily as long as he is taking his Lasix twice daily. I also refilled the Lasix 40 mg for twice daily dosing. The lab that looks at the fluid marker called BMP is decreasing, but is still significantly high at around 1500.  So I do feel he needs to continue the twice daily dosing of the Lasix. Vitamin D levels are normal.  His parathyroid hormone levels are mildly elevated.  This occurs with kidney dysfunction over time.  We just monitor this every 6 months to ensure does not continue to increase.  Nothing else needs to be done at this time. LDL/bad cholesterol is at goal  His A1c increased to 6.8, was 6.0 last time.For now, no changes.  We will discuss this in more detail at his follow-up   Please have them schedule an appointment for 2 week for follow-up on his edema.

## 2023-01-09 DIAGNOSIS — H43813 Vitreous degeneration, bilateral: Secondary | ICD-10-CM | POA: Diagnosis not present

## 2023-01-09 DIAGNOSIS — H353134 Nonexudative age-related macular degeneration, bilateral, advanced atrophic with subfoveal involvement: Secondary | ICD-10-CM | POA: Diagnosis not present

## 2023-01-09 DIAGNOSIS — Z961 Presence of intraocular lens: Secondary | ICD-10-CM | POA: Diagnosis not present

## 2023-01-09 DIAGNOSIS — H401132 Primary open-angle glaucoma, bilateral, moderate stage: Secondary | ICD-10-CM | POA: Diagnosis not present

## 2023-01-09 DIAGNOSIS — H35033 Hypertensive retinopathy, bilateral: Secondary | ICD-10-CM | POA: Diagnosis not present

## 2023-01-09 DIAGNOSIS — H35373 Puckering of macula, bilateral: Secondary | ICD-10-CM | POA: Diagnosis not present

## 2023-01-13 ENCOUNTER — Telehealth: Payer: Self-pay

## 2023-01-13 NOTE — Telephone Encounter (Signed)
Transition Care Management Unsuccessful Follow-up Telephone Call  Date of discharge and from where:  12/29/2022 [facility]  Attempts:  1st Attempt  Reason for unsuccessful TCM follow-up call:  Unable to reach patient Patient not available.  Shai Mckenzie [facility]  [facility], [facility] Guide Direct Dial: [phone]  Website: [email or web address]

## 2023-01-14 ENCOUNTER — Telehealth: Payer: Self-pay

## 2023-01-14 DIAGNOSIS — D472 Monoclonal gammopathy: Secondary | ICD-10-CM | POA: Diagnosis not present

## 2023-01-14 DIAGNOSIS — E1122 Type 2 diabetes mellitus with diabetic chronic kidney disease: Secondary | ICD-10-CM | POA: Diagnosis not present

## 2023-01-14 DIAGNOSIS — I5020 Unspecified systolic (congestive) heart failure: Secondary | ICD-10-CM | POA: Diagnosis not present

## 2023-01-14 DIAGNOSIS — N184 Chronic kidney disease, stage 4 (severe): Secondary | ICD-10-CM | POA: Diagnosis not present

## 2023-01-14 DIAGNOSIS — I129 Hypertensive chronic kidney disease with stage 1 through stage 4 chronic kidney disease, or unspecified chronic kidney disease: Secondary | ICD-10-CM | POA: Diagnosis not present

## 2023-01-14 NOTE — Telephone Encounter (Signed)
Transition Care Management Follow-up Telephone Call Date of discharge and from where: 12/29/2022 [name] How have you been since you were released from the hospital? Patient stated he is feeling much better. Any questions or concerns? No  Items Reviewed: Did the pt receive and understand the discharge instructions provided? Yes  Medications obtained and verified? Yes  Other? No  Any new allergies since your discharge? No  Dietary orders reviewed? Yes Do you have support at home? Yes   Follow up appointments reviewed:  PCP Hospital f/u appt confirmed? Yes  Scheduled to see Natalia Leatherwood, DO on 01/06/2023 @ [facility]. Specialist Hospital f/u appt confirmed? Yes  Scheduled to see Theron Arista C. Eden Emms, MD on 02/17/2023 @ [facility]. Are transportation arrangements needed? No  If their condition worsens, is the pt aware to call PCP or go to the Emergency Dept.? Yes Was the patient provided with contact information for the PCP's office or ED? Yes Was to pt encouraged to call back with questions or concerns? Yes  [name] [name]  [facility], [facility] Guide Direct Dial: [phone]  Website: [email or web address]

## 2023-01-23 ENCOUNTER — Other Ambulatory Visit: Payer: Self-pay | Admitting: Family Medicine

## 2023-01-23 ENCOUNTER — Encounter: Payer: Medicare Other | Admitting: Family Medicine

## 2023-01-23 ENCOUNTER — Encounter: Payer: Self-pay | Admitting: Family Medicine

## 2023-01-23 DIAGNOSIS — E8770 Fluid overload, unspecified: Secondary | ICD-10-CM

## 2023-01-23 DIAGNOSIS — I10 Essential (primary) hypertension: Secondary | ICD-10-CM

## 2023-01-23 DIAGNOSIS — R600 Localized edema: Secondary | ICD-10-CM | POA: Insufficient documentation

## 2023-01-23 NOTE — Patient Instructions (Signed)

## 2023-01-23 NOTE — Progress Notes (Signed)
Bmp ordered

## 2023-01-23 NOTE — Addendum Note (Signed)
Addended by: Filomena Jungling on: 01/23/2023 02:12 PM   Modules accepted: Orders

## 2023-01-23 NOTE — Progress Notes (Addendum)
[name] , 1928/11/25, 89 y.o., male MRN: [id number] Patient Care Team    Relationship Specialty Notifications Start End  Natalia Leatherwood, DO PCP - General Family Medicine  12/29/22   Wendall Stade, MD PCP - Cardiology Cardiology  03/17/17   Hillis Range, MD (Inactive) PCP - Electrophysiology Cardiology  06/26/21   Janet Berlin, MD Consulting Physician Ophthalmology  12/18/15   Mathews Robinsons, MD Consulting Physician Dermatology  12/18/15   Wendall Stade, MD Consulting Physician Cardiology  12/18/15   Stephannie Li, MD Consulting Physician Ophthalmology  12/19/15   Josph Macho, MD Consulting Physician Oncology  09/30/16   Arita Miss, MD Attending Physician Nephrology  09/30/16   Donzetta Starch, MD Consulting Physician Dermatology  09/30/16   Marcine Matar, MD Consulting Physician Urology  06/30/19   Merwyn Katos, DPM Consulting Physician Podiatry  06/30/19     Chief Complaint  Patient presents with   Edema    Subjective: [name] is a 89 y.o. male present for [facility] Edema/dyspnea: He is feeling better. More energy and breath well. Walking daily. No dyspnea  Prior note: Patient was seen in the emergency room 12/29/2022 for dyspnea on exertion found to be fluid overloaded.  Diuresed with IV Lasix and sent home on higher dose Lasix.  Patient reports he is feeling much improved as far as his breathing is concerned.  He still endorses having more edema in his lower extremities than he is used to.  He also reports having some fatigue feeling in his legs, but has been completing exercises to help strengthen his legs. Diet: Low sodium diet followed Exercise: very active RF: CKD3, HLD, CAD w/ CABG (1999), cardiac cath 2011 2/2 CP (all grafts normal), PVD, Afib Significant cardiac history of CAD with prior CABG in 1999 -LIMA to the LAD, SVG to IM, OM1, OM 2, SVG to D1, SVG to PDA/PLA, EF 45-50% with mild diffuse hypokinesis. Grafts patent by cath 2011   Nuclear stress test  08/2015 scar, no ischemia LVEF 70% also has atrial fibrillation/flutter in 2014 on Eliquis status post [facility] 10/2014, PVD, claudication.  Cardioversion 04/01/2017.  Pacemaker implant 07/28/2017. 08/26/2022 echocardiogram: Ejection fraction reported as low normal (left ventricular EF 50-55% %) with mild/moderate mitral regurg.  Severe tricuspid regurg from A-fib. Right ventricular volume overloaded Right ventricular systolic function mildly reduced, right ventricular size is mildly enlarged. Moderately elevated pulmonary artery systolic pressure with pulmonic valve regurgitation moderate Left atrium severely dilated Right atrial size moderately dilated Patient has a moderately sized PFO with left-to-right shunting present.    Allergies  Allergen Reactions   Lisinopril Other (See Comments)     he has difficulty controlling potassium on ace/arb- lisinopril was dc'd in the past 2/2 to hyperkalemia documented by [facility] PCP 01/2021   Penicillins Hives    Has patient had a PCN reaction causing immediate rash, facial/tongue/throat swelling, SOB or lightheadedness with hypotension: No Has patient had a PCN reaction causing severe rash involving mucus membranes or skin necrosis: Yes Has patient had a PCN reaction that required hospitalization: No Has patient had a PCN reaction occurring within the last 10 years: No If all of the above answers are "NO", then may proceed with Cephalosporin use.    Vancomycin Other (See Comments)    Edema and myalgia   Social History   Tobacco Use   Smoking status: Former   Smokeless tobacco: Former    Types: Chew    Quit date: 02/03/1969  Substance Use Topics   Alcohol use: No    Alcohol/week: 0.0 standard drinks of alcohol   Past Medical History:  Diagnosis Date   A-fib (HCC)    Basal cell carcinoma    skin   Bigeminal rhythm    Bradycardia 06/21/2017   Cancer of anterior wall of urinary bladder (HCC) 07/18/2019   Cataract    Chronic renal insufficiency,  stage 3 (moderate) (HCC) 2018   GFR 30s-40s   Coronary artery disease    post bypass   CVD (cardiovascular disease)    Diabetes mellitus without complication (HCC)    type 2 diet controlled   Diverticular disease    Easy bruising    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   Gout    Hematuria 06/30/2019   Hernia    Hyperkalemia 05/06/2017   Hyperlipidemia    Hypertension    IBS (irritable bowel syndrome)    Iron deficiency anemia 02/04/2016   Macular degeneration    Microscopic colitis    PVD (peripheral vascular disease) (HCC)    Past Surgical History:  Procedure Laterality Date   CARDIAC CATHETERIZATION  2006   CARDIOVERSION N/A 02/22/2013   Procedure: CARDIOVERSION;  Surgeon: Wendall Stade, MD;  Location: [facility] ENDOSCOPY;  Service: Cardiovascular;  Laterality: N/A;   CARDIOVERSION N/A 11/15/2014   Procedure: CARDIOVERSION;  Surgeon: Wendall Stade, MD;  Location: [facility] ENDOSCOPY;  Service: Cardiovascular;  Laterality: N/A;   CARDIOVERSION N/A 04/01/2017   Procedure: CARDIOVERSION;  Surgeon: Laurey Morale, MD;  Location: [facility] ENDOSCOPY;  Service: Cardiovascular;  Laterality: N/A;   CAROTID ENDARTERECTOMY  2009/ 1993   left/ right   COLONOSCOPY  03/17/2005   The colon is normal.   CORONARY ARTERY BYPASS GRAFT  1999   CYSTOSCOPY W/ RETROGRADES Bilateral 07/14/2019   Procedure: CYSTOSCOPY WITH RETROGRADE PYELOGRAM;  Surgeon: Marcine Matar, MD;  Location: [facility];  Service: Urology;  Laterality: Bilateral;   ESOPHAGOGASTRODUODENOSCOPY  03/17/2005   Normal esophagus. Normal Stomanch. Normal duodenum.    EYE SURGERY     eyelid repair   INGUINAL HERNIA REPAIR  02/11/2012   Procedure: LAPAROSCOPIC BILATERAL INGUINAL HERNIA REPAIR;  Surgeon: Valarie Merino, MD;  Location: [facility] ORS;  Service: General;  Laterality: Bilateral;   PACEMAKER IMPLANT N/A 07/28/2017   [employer]urity MRI conditional  dual-chamber pacemaker for symptomatic second degree AV block by  Dr Johney Frame   SKIN CANCER EXCISION     right ear x 3   TRANSURETHRAL RESECTION OF BLADDER TUMOR N/A 09/05/2019   Procedure: TRANSURETHRAL RESECTION OF BLADDER TUMOR (TURBT);  Surgeon: Marcine Matar, MD;  Location: [facility];  Service: Urology;  Laterality: N/A;   TRANSURETHRAL RESECTION OF BLADDER TUMOR WITH MITOMYCIN-C N/A 07/14/2019   Procedure: TRANSURETHRAL RESECTION OF BLADDER TUMOR WITH GEMCITABINE IN PACU;  Surgeon: Marcine Matar, MD;  Location: [facility];  Service: Urology;  Laterality: N/A;   Family History  Problem Relation Age of Onset   Stroke Mother    Coronary artery disease Father    Heart disease Father    Melanoma Sister    Colon cancer Neg Hx    Allergies as of 01/23/2023       Reactions   Lisinopril Other (See Comments)    he has difficulty controlling potassium on ace/arb- lisinopril was dc'd in the past 2/2 to hyperkalemia documented by [facility] PCP 01/2021   Penicillins Hives   Has patient had a PCN reaction causing immediate rash, facial/tongue/throat swelling,  SOB or lightheadedness with hypotension: No Has patient had a PCN reaction causing severe rash involving mucus membranes or skin necrosis: Yes Has patient had a PCN reaction that required hospitalization: No Has patient had a PCN reaction occurring within the last 10 years: No If all of the above answers are "NO", then may proceed with Cephalosporin use.   Vancomycin Other (See Comments)   Edema and myalgia        Medication List        Accurate as of January 23, 2023  2:04 PM. If you have any questions, ask your nurse or doctor.          allopurinol 100 MG tablet Commonly known as: ZYLOPRIM Take 1 tablet (100 mg total) by mouth daily.   amLODipine 5 MG tablet Commonly known as: NORVASC Take 1 tablet (5 mg total) by mouth at bedtime.   atorvastatin 20 MG tablet Commonly known as: LIPITOR Take 1 tablet (20 mg total) by mouth at bedtime.    carvedilol 6.25 MG tablet Commonly known as: COREG Take 1 tablet (6.25 mg total) by mouth 2 (two) times daily with a meal.   Cholecalciferol 125 MCG (5000 UT) capsule Take 5,000 Units by mouth daily.   dicyclomine 10 MG capsule Commonly known as: BENTYL Take 1 capsule (10 mg total) by mouth every 6 (six) hours as needed.   Eliquis 2.5 MG Tabs tablet Generic drug: apixaban TAKE 1 TABLET BY MOUTH TWICE A DAY   finasteride 5 MG tablet Commonly known as: PROSCAR Take 5 mg by mouth daily.   fish oil-omega-3 fatty acids 1000 MG capsule Take 1 g by mouth daily.   fluticasone 50 MCG/ACT nasal spray Commonly known as: FLONASE Place 2 sprays into both nostrils daily.   furosemide 40 MG tablet Commonly known as: LASIX Take 1 tablet (40 mg total) by mouth 2 (two) times daily.   latanoprost 0.005 % ophthalmic solution Commonly known as: XALATAN Place 1 drop into both eyes at bedtime.   onetouch ultrasoft lancets Use to check sugar daily as instructed.   OneTouch Verio test strip Generic drug: glucose blood Use to check sugar daily as instructed.   Potassium Chloride ER 20 MEQ Tbcr Take 1 tablet (20 mEq total) by mouth 2 (two) times daily.   PRESERVISION AREDS PO Take 1 tablet by mouth 2 (two) times daily.   PROBIOTIC PO Take 1 tablet by mouth daily.   tamsulosin 0.4 MG Caps capsule Commonly known as: FLOMAX Take 0.4 mg by mouth in the morning and at bedtime.   timolol 0.5 % ophthalmic solution Commonly known as: TIMOPTIC Place 1 drop into both eyes 2 (two) times daily.   UNABLE TO FIND Take 2 tablets by mouth every morning. Med Name: [name] (takes place of Co Q 10)        No results found for this or any previous visit (from the past 24 hour(s)).  No results found.   ROS: Negative, with the exception of above mentioned in HPI  Objective:  BP (!) 139/53   Pulse 66   Temp 98.2 F (36.8 C)   Wt 150 lb 6.4 oz (68.2 kg)   SpO2 96%   BMI 23.56 kg/m   Body mass index is 23.56 kg/m.  Physical Exam Vitals and nursing note reviewed.  Constitutional:      General: He is not in acute distress.    Appearance: Normal appearance. He is not ill-appearing, toxic-appearing or diaphoretic.  HENT:  Head: Normocephalic and atraumatic.     Mouth/Throat:     Mouth: Mucous membranes are moist.  Eyes:     General: No scleral icterus.       Right eye: No discharge.        Left eye: No discharge.     Extraocular Movements: Extraocular movements intact.     Pupils: Pupils are equal, round, and reactive to light.  Cardiovascular:     Rate and Rhythm: Normal rate and regular rhythm.     Heart sounds: Murmur heard.  Pulmonary:     Effort: Pulmonary effort is normal. No respiratory distress.     Breath sounds: Normal breath sounds. No wheezing, rhonchi or rales.  Musculoskeletal:     Cervical back: Neck supple.     Right lower leg: Edema present.     Left lower leg: Edema present.  Lymphadenopathy:     Cervical: No cervical adenopathy.  Skin:    General: Skin is warm.     Findings: No rash.  Neurological:     Mental Status: He is alert and oriented to person, place, and time. Mental status is at baseline.  Psychiatric:        Mood and Affect: Mood normal.        Behavior: Behavior normal.        Thought Content: Thought content normal.        Judgment: Judgment normal.    No results found for this or any previous visit (from the past 72 hour(s)).  Assessment/Plan: [name] is a 89 y.o. male present for OV for  Bilateral edema BMP collected today. He is monitoring daily weights and walking more. Looks better today- still with his normal level of LE Edema.  Continue amlodipine 5 mg daily Continue to coreg 6.25 mg twice daily. Continue Eliquis>cardio Continue Lasix 40 mg twice daily Continue potassium to K-Dur 20 mg twice daily - continue  fish oil supplementation - Low-sodium diet.  -  He BP has been  very difficult to  control at times and his hydration and IDA/epo injections cause fluctuations leading to both highs and lower than desired readings. Certainly would rather mildly elevated BP over too low. Continue Dr. Nishan-cardiology follow-ups   Meds managed by primary team: [facility]: Lasix, potassium, [facility] scripts: Amlodipine, Coreg, Lipitor, allopurinol  No orders of the defined types were placed in this encounter.  No orders of the defined types were placed in this encounter.   Referral Orders  No referral(s) requested today     electronically signed by:  Felix Pacini, DO  [facility] - [place]

## 2023-01-23 NOTE — Addendum Note (Signed)
Addended by: Felix Pacini A on: 01/23/2023 02:15 PM   Modules accepted: Level of Service

## 2023-01-23 NOTE — Addendum Note (Signed)
Addended by: Filomena Jungling on: 01/23/2023 02:20 PM   Modules accepted: Orders

## 2023-01-24 LAB — BASIC METABOLIC PANEL
BUN/Creatinine Ratio: 20 (calc) (ref 6–22)
BUN: 47 mg/dL — ABNORMAL HIGH (ref 7–25)
CO2: 28 mmol/L (ref 20–32)
Calcium: 8.1 mg/dL — ABNORMAL LOW (ref 8.6–10.3)
Chloride: 101 mmol/L (ref 98–110)
Creat: 2.35 mg/dL — ABNORMAL HIGH (ref 0.70–1.22)
Glucose, Bld: 164 mg/dL — ABNORMAL HIGH (ref 65–99)
Potassium: 4.4 mmol/L (ref 3.5–5.3)
Sodium: 137 mmol/L (ref 135–146)

## 2023-01-24 LAB — CLIENT EDUCATION TRACKING

## 2023-01-26 ENCOUNTER — Telehealth: Payer: Self-pay | Admitting: Family Medicine

## 2023-01-26 NOTE — Telephone Encounter (Signed)
LM for pt to return call to discuss.  

## 2023-01-26 NOTE — Telephone Encounter (Signed)
Please inform patient this potassium looks good.  Continue the lasix and the potassium twice daily.

## 2023-01-27 ENCOUNTER — Ambulatory Visit: Payer: Medicare Other

## 2023-01-27 DIAGNOSIS — I442 Atrioventricular block, complete: Secondary | ICD-10-CM

## 2023-01-27 LAB — CUP PACEART REMOTE DEVICE CHECK
Battery Remaining Longevity: 54 mo
Battery Remaining Percentage: 47 %
Battery Voltage: 2.98 V
Brady Statistic RV Percent Paced: 99 %
Date Time Interrogation Session: 20241001034508
Implantable Lead Connection Status: 753985
Implantable Lead Connection Status: 753985
Implantable Lead Implant Date: 20190402
Implantable Lead Implant Date: 20190402
Implantable Lead Location: 753859
Implantable Lead Location: 753860
Implantable Pulse Generator Implant Date: 20190402
Lead Channel Impedance Value: 410 Ohm
Lead Channel Pacing Threshold Amplitude: 0.5 V
Lead Channel Pacing Threshold Pulse Width: 0.5 ms
Lead Channel Sensing Intrinsic Amplitude: 9.5 mV
Lead Channel Setting Pacing Amplitude: 2.5 V
Lead Channel Setting Pacing Pulse Width: 0.5 ms
Lead Channel Setting Sensing Sensitivity: 2 mV
Pulse Gen Model: 2272
Pulse Gen Serial Number: 9003454

## 2023-02-09 NOTE — Progress Notes (Signed)
Office Visit    Patient Name: [name] Date of Encounter: 02/17/2023  PCP:  William Leatherwood DO   [facility]  Cardiologist:  William Haws, MD  Advanced Practice Provider:  No care team member to display  HPI    [name] is a 89 y.o. male with a past medical history of CAD status post CABG in 1999 (LIMA to LAD, SVG to IM, sequential SVG to OM1, OM 2, SVG to D1, SVG to PDA), CHB status post [facility] Jude PPM in 2019, PAF, anemia on aranesp and iron infusions chronically presents today for follow-up appointment.  He was seen by Dr. Cyndie [name] 05/2021 and at that visit was doing well.  No complaints of shortness of breath or chest pain.  TTE was completed 11/20/2020 with EF 40 to 45% and severe biatrial enlargement with moderate MR.  Shared decision given patient's age and renal failure that cardiac cath would not be pursued.  He was admitted 09/16/2021 for heart failure exacerbation.  Presented with shortness of breath and was admitted for diuresis with a chest x-ray noted small effusion, pulmonary vascular congestion, BNP 1700, creatinine was 2.4.  Lost 6.2 L.  Discharged in stable condition after 2 days with oral prescription 40 mg of Lasix as needed.  No further medication changes made at that time.  Hospital 05/12/22 through 05/17/2022 and had 7 L of fluid pulled off of him.  He was on IV Lasix.  He was admitted for CHF exacerbation.  He is on daily Lasix 40 mg, can take an extra 40 mg if he weighs over 3 pounds in 1 night or 5 pounds in a week.  He is only had to do this twice since admission.  He weighs every morning and today weight 144.8 pounds at home on his scale.  He does request he can stay away from salt.  He states that the vein was taken out of his right leg for his CABG back in 1999 so that likely was small is a little bit more than the other.  We discussed elastic therapy in [name] for compression. Currently on lasix 40 mg bid   Sees Dr [name]  for anemia with periodic iron infusions . Sees renal and Cr stable Seeing [facility] derm for basal cell on top of his head   PPM set VVI with permanent afib. 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   Gout    Hematuria 06/30/2019   Hernia    Hyperkalemia 05/06/2017   Hyperlipidemia    Hypertension    IBS (irritable bowel syndrome)    Iron deficiency anemia 02/04/2016   Macular degeneration    Microscopic colitis    PVD (peripheral vascular disease) (HCC)    Past Surgical History:  Procedure Laterality Date  CARDIAC CATHETERIZATION  2006   CARDIOVERSION N/A 02/22/2013   Procedure: CARDIOVERSION;  Surgeon: Wendall Stade, MD;  Location: [facility] ENDOSCOPY;  Service: Cardiovascular;  Laterality: N/A;   CARDIOVERSION N/A 11/15/2014   Procedure: CARDIOVERSION;  Surgeon: Wendall Stade, MD;  Location: [facility] ENDOSCOPY;  Service: Cardiovascular;  Laterality: N/A;   CARDIOVERSION N/A 04/01/2017   Procedure: CARDIOVERSION;  Surgeon: Laurey Morale, MD;  Location: [facility] ENDOSCOPY;  Service: Cardiovascular;  Laterality: N/A;   CAROTID ENDARTERECTOMY  2009/ 1993   left/ right   COLONOSCOPY  03/17/2005   The colon is normal.   CORONARY ARTERY BYPASS GRAFT  1999   CYSTOSCOPY W/ RETROGRADES Bilateral 07/14/2019   Procedure: CYSTOSCOPY WITH RETROGRADE PYELOGRAM;  Surgeon: Marcine Matar, MD;  Location: [facility];   Service: Urology;  Laterality: Bilateral;   ESOPHAGOGASTRODUODENOSCOPY  03/17/2005   Normal esophagus. Normal Stomanch. Normal duodenum.    EYE SURGERY     eyelid repair   INGUINAL HERNIA REPAIR  02/11/2012   Procedure: LAPAROSCOPIC BILATERAL INGUINAL HERNIA REPAIR;  Surgeon: Valarie Merino, MD;  Location: [facility] ORS;  Service: General;  Laterality: Bilateral;   PACEMAKER IMPLANT N/A 07/28/2017   St Jude Medical Assurity MRI conditional  dual-chamber pacemaker for symptomatic second degree AV block by Dr Johney Frame   SKIN CANCER EXCISION     right ear x 3   TRANSURETHRAL RESECTION OF BLADDER TUMOR N/A 09/05/2019   Procedure: TRANSURETHRAL RESECTION OF BLADDER TUMOR (TURBT);  Surgeon: Marcine Matar, MD;  Location[facility];  Service: Urology;  Laterality: N/A;   TRANSURETHRAL RESECTION OF BLADDER TUMOR WITH MITOMYCIN-C N/A 07/14/2019   Procedure: TRANSURETHRAL RESECTION OF BLADDER TUMOR WITH GEMCITABINE IN PACU;  Surgeon: Marcine Matar, MD;  Location: [facility];  Service: Urology;  Laterality: N/A;    Allergies  Allergies  Allergen Reactions   Lisinopril Other (See Comments)     he has difficulty controlling potassium on ace/arb- lisinopril was dc'd in the past 2/2 to hyperkalemia documented by [facility] PCP 01/2021   Penicillins Hives    Has patient had a PCN reaction causing immediate rash, facial/tongue/throat swelling, SOB or lightheadedness with hypotension: No Has patient had a PCN reaction causing severe rash involving mucus membranes or skin necrosis: Yes Has patient had a PCN reaction that required hospitalization: No Has patient had a PCN reaction occurring within the last 10 years: No If all of the above answers are "NO", then may proceed with Cephalosporin use.    Vancomycin Other (See Comments)    Edema and myalgia   EKGs/Labs/Other Studies Reviewed:   The following studies were reviewed today:  Echocardiogram 08/27/22 IMPRESSIONS    1. Left ventricular ejection fraction, by estimation, is 50 to 55%. The  left ventricle has low normal function. The left ventricle has no regional  wall motion abnormalities. There is mild left ventricular hypertrophy.  Left ventricular diastolic  parameters are indeterminate. The interventricular septum is mildly  flattened in diastole ('D' shaped left ventricle), consistent with right  ventricular volume overload. Apical dyssynchrony due to pacing.   2. Right ventricular systolic function is mildly reduced. The right  ventricular size is mildly enlarged. There is moderately elevated  pulmonary artery systolic pressure. The estimated right ventricular  systolic pressure is 48.4 mmHg.   3. Tricuspid valve regurgitation is severe.   4. Pulmonic valve regurgitation is moderate.   5. Left atrial size was severely dilated.   6. Right atrial size was mild to moderately  dilated.   7. The mitral valve is degenerative. Mild to moderate mitral valve  regurgitation. No evidence of mitral stenosis.   8. The aortic valve is grossly normal. Aortic valve regurgitation is  trivial. No aortic stenosis is present.   9. There is mild (Grade II) plaque involving the aortic root.  10. The inferior vena cava is dilated in size with <50% respiratory  variability, suggesting right atrial pressure of 15 mmHg.  11. Evidence of atrial level shunting detected by color flow Doppler.  There is a moderately sized patent foramen ovale with predominantly left  to right shunting across the atrial septum.    EKG:  EKG is not ordered today.    Recent Labs: 12/29/2022: ALT 26; B Natriuretic Peptide 1,768.2; Hemoglobin 10.0; Platelets 126 01/06/2023: Pro B Natriuretic peptide (BNP) 1,570.0 01/23/2023: BUN 47; Creat 2.35; Potassium 4.4; Sodium 137  Recent Lipid Panel    Component Value Date/Time   CHOL 125 11/24/2019 1138   TRIG 67 11/24/2019 1138   HDL 57 11/24/2019 1138   CHOLHDL 2.2 11/24/2019 1138   VLDL 9  06/21/2017 0420   LDLCALC 54 11/24/2019 1138   LDLDIRECT 64.0 01/06/2023 1341     Home Medications   Current Meds  Medication Sig   allopurinol (ZYLOPRIM) 100 MG tablet Take 1 tablet (100 mg total) by mouth daily.   amLODipine (NORVASC) 5 MG tablet Take 1 tablet (5 mg total) by mouth at bedtime.   atorvastatin (LIPITOR) 20 MG tablet Take 1 tablet (20 mg total) by mouth at bedtime.   carvedilol (COREG) 6.25 MG tablet Take 1 tablet (6.25 mg total) by mouth 2 (two) times daily with a meal.   Cholecalciferol 125 MCG (5000 UT) capsule Take 5,000 Units by mouth daily.   dicyclomine (BENTYL) 10 MG capsule Take 1 capsule (10 mg total) by mouth every 6 (six) hours as needed.   ELIQUIS 2.5 MG TABS tablet TAKE 1 TABLET BY MOUTH TWICE A DAY   finasteride (PROSCAR) 5 MG tablet Take 5 mg by mouth daily.   fish oil-omega-3 fatty acids 1000 MG capsule Take 1 g by mouth daily.    fluticasone (FLONASE) 50 MCG/ACT nasal spray Place 2 sprays into both nostrils daily.   furosemide (LASIX) 40 MG tablet Take 1 tablet (40 mg total) by mouth 2 (two) times daily.   glucose blood (ONETOUCH VERIO) test strip Use to check sugar daily as instructed.   Lancets (ONETOUCH ULTRASOFT) lancets Use to check sugar daily as instructed.   latanoprost (XALATAN) 0.005 % ophthalmic solution Place 1 drop into both eyes at bedtime.   Multiple Vitamins-Minerals (PRESERVISION AREDS PO) Take 1 tablet by mouth 2 (two) times daily.    potassium chloride 20 MEQ TBCR Take 1 tablet (20 mEq total) by mouth 2 (two) times daily.   Probiotic Product (PROBIOTIC PO) Take 1 tablet by mouth daily.   tamsulosin (FLOMAX) 0.4 MG CAPS capsule Take 0.4 mg by mouth in the morning and at bedtime.   timolol (TIMOPTIC) 0.5 % ophthalmic solution Place 1 drop into both eyes 2 (two) times daily.    UNABLE TO FIND Take 2 tablets by mouth every morning. Med Name: [name] (takes place of Co Q 10)     Review of Systems      All other systems reviewed and are  otherwise negative except as noted above.  Physical Exam    VS:  BP (!) 106/50   Pulse 67   Ht 5\' 7"  (1.702 m)  Wt 150 lb (68 kg)   SpO2 94%   BMI 23.49 kg/m  , BMI Body mass index is 23.49 kg/m.  Wt Readings from Last 3 Encounters:  02/17/23 150 lb (68 kg)  01/23/23 150 lb 6.4 oz (68.2 kg)  01/06/23 146 lb 3.2 oz (66.3 kg)    Affect appropriate Elderly male  HEENT: normal Neck supple with no adenopathy JVP elevated from TR no bruits no thyromegaly Lungs clear with no wheezing and good diaphragmatic motion Heart:  S1/S2 MR  murmur, no rub, gallop or click PMI normal  PPM under left clavicile  Abdomen: benighn, BS positve, no tenderness, no AAA no bruit.  No HSM or HJR Distal pulses intact with no bruits No edema Neuro non-focal Large RAW basal cell on apex of scalp  No muscular weakness   Assessment & Plan    CAD status post CABG -continue statin ASA and beta blocker  -no chest pain or increased SOB - given age and lack of chest pain no indication for stress testing   Carotid artery disease -left bruit on exam -duplex 07/01/22 plaque no stenosis   Atrial fibrillation on chronic anticoagulation -Continue Eliquis 2.5 mg twice a day and coreg -He is in rate controlled A-fib today  CHF   -Mild lower extremity edema today, significantly better -Dry weight appears to be 142 pounds -he has severe TR likely due to PPM leads - TTE 08/27/22 50-55% with mild/mod MR and severe TR   PPM -no change, stable St Jude 100% V pacing F/U Mealor   6.  Anemia:  f/u Dr [name] erythropoietin and iron as needed for Hb < 11  7.  CRF:  Baseline Cr 1.8 Check today f/u Nephrology  8. Derm:  large basal cell on scalp seeing Mohs surgeon at [facility]     F/U in 6 months   Signed, William Haws, MD 02/17/2023, 3:10 PM [facility]

## 2023-02-11 NOTE — Progress Notes (Signed)
Remote pacemaker transmission.   

## 2023-02-15 ENCOUNTER — Emergency Department (HOSPITAL_BASED_OUTPATIENT_CLINIC_OR_DEPARTMENT_OTHER): Payer: Medicare Other

## 2023-02-15 ENCOUNTER — Emergency Department (HOSPITAL_BASED_OUTPATIENT_CLINIC_OR_DEPARTMENT_OTHER)
Admission: EM | Admit: 2023-02-15 | Discharge: 2023-02-15 | Disposition: A | Discharge status: Home / Self Care | Payer: Medicare Other | Attending: Emergency Medicine | Admitting: Emergency Medicine

## 2023-02-15 ENCOUNTER — Encounter (HOSPITAL_BASED_OUTPATIENT_CLINIC_OR_DEPARTMENT_OTHER): Payer: Self-pay

## 2023-02-15 DIAGNOSIS — S0003XA Contusion of scalp, initial encounter: Secondary | ICD-10-CM | POA: Diagnosis not present

## 2023-02-15 DIAGNOSIS — S0990XA Unspecified injury of head, initial encounter: Secondary | ICD-10-CM | POA: Diagnosis present

## 2023-02-15 DIAGNOSIS — M4802 Spinal stenosis, cervical region: Secondary | ICD-10-CM | POA: Diagnosis not present

## 2023-02-15 DIAGNOSIS — E1122 Type 2 diabetes mellitus with diabetic chronic kidney disease: Secondary | ICD-10-CM | POA: Insufficient documentation

## 2023-02-15 DIAGNOSIS — I2510 Atherosclerotic heart disease of native coronary artery without angina pectoris: Secondary | ICD-10-CM | POA: Diagnosis not present

## 2023-02-15 DIAGNOSIS — N189 Chronic kidney disease, unspecified: Secondary | ICD-10-CM | POA: Insufficient documentation

## 2023-02-15 DIAGNOSIS — S199XXA Unspecified injury of neck, initial encounter: Secondary | ICD-10-CM | POA: Diagnosis not present

## 2023-02-15 DIAGNOSIS — I129 Hypertensive chronic kidney disease with stage 1 through stage 4 chronic kidney disease, or unspecified chronic kidney disease: Secondary | ICD-10-CM | POA: Diagnosis not present

## 2023-02-15 DIAGNOSIS — Z8551 Personal history of malignant neoplasm of bladder: Secondary | ICD-10-CM | POA: Insufficient documentation

## 2023-02-15 DIAGNOSIS — W1839XA Other fall on same level, initial encounter: Secondary | ICD-10-CM | POA: Insufficient documentation

## 2023-02-15 DIAGNOSIS — Z79899 Other long term (current) drug therapy: Secondary | ICD-10-CM | POA: Insufficient documentation

## 2023-02-15 DIAGNOSIS — Z7901 Long term (current) use of anticoagulants: Secondary | ICD-10-CM | POA: Diagnosis not present

## 2023-02-15 DIAGNOSIS — I63233 Cerebral infarction due to unspecified occlusion or stenosis of bilateral carotid arteries: Secondary | ICD-10-CM | POA: Diagnosis not present

## 2023-02-15 DIAGNOSIS — M47812 Spondylosis without myelopathy or radiculopathy, cervical region: Secondary | ICD-10-CM | POA: Diagnosis not present

## 2023-02-15 DIAGNOSIS — W19XXXA Unspecified fall, initial encounter: Secondary | ICD-10-CM

## 2023-02-15 DIAGNOSIS — J90 Pleural effusion, not elsewhere classified: Secondary | ICD-10-CM | POA: Diagnosis not present

## 2023-02-15 NOTE — ED Provider Notes (Signed)
[place] EMERGENCY DEPARTMENT AT [facility] Provider Note   CSN: [id number] Arrival date & time: 02/15/23  1013     History  Chief Complaint  Patient presents with   Fall   Head Laceration    [name] is a 89 y.o. male with past medical history significant for hypertension, CAD, hyperlipidemia, A-fib with chronic anticoagulation on Eliquis, CKD, bladder cancer, diabetes presents to the ED after a fall.  Patient reports that he was getting into an SUV to go to church his right leg gave way causing him to fall and land on his back striking his head on the ground.  Denies loss of consciousness, nausea, vomiting, visual disturbance, dizziness, lightheadedness.  He reports he does have some pain in his head, but is already improved.       Home Medications Prior to Admission medications   Medication Sig Start Date End Date Taking? Authorizing Provider  allopurinol (ZYLOPRIM) 100 MG tablet Take 1 tablet (100 mg total) by mouth daily. 01/08/23   Kuneff, Renee A, DO  amLODipine (NORVASC) 5 MG tablet Take 1 tablet (5 mg total) by mouth at bedtime. 01/06/23   Kuneff, Renee A, DO  atorvastatin (LIPITOR) 20 MG tablet Take 1 tablet (20 mg total) by mouth at bedtime. 07/22/22   Kuneff, Renee A, DO  carvedilol (COREG) 6.25 MG tablet Take 1 tablet (6.25 mg total) by mouth 2 (two) times daily with a meal. 01/06/23   Kuneff, Renee A, DO  Cholecalciferol 125 MCG (5000 UT) capsule Take 5,000 Units by mouth daily.    [provider]  dicyclomine (BENTYL) 10 MG capsule Take 1 capsule (10 mg total) by mouth every 6 (six) hours as needed. 10/23/22   Kuneff, Renee A, DO  ELIQUIS 2.5 MG TABS tablet TAKE 1 TABLET BY MOUTH TWICE A DAY 12/18/22   Mealor, Roberts Gaudy, MD  finasteride (PROSCAR) 5 MG tablet Take 5 mg by mouth daily. 08/24/17   [provider]  fish oil-omega-3 fatty acids 1000 MG capsule Take 1 g by mouth daily.     [provider]  fluticasone (FLONASE) 50  MCG/ACT nasal spray Place 2 sprays into both nostrils daily. 02/13/22   [provider]  furosemide (LASIX) 40 MG tablet Take 1 tablet (40 mg total) by mouth 2 (two) times daily. 01/08/23   Kuneff, Renee A, DO  glucose blood (ONETOUCH VERIO) test strip Use to check sugar daily as instructed. 09/25/22   Kuneff, Renee A, DO  Lancets (ONETOUCH ULTRASOFT) lancets Use to check sugar daily as instructed. 09/25/22   Kuneff, Renee A, DO  latanoprost (XALATAN) 0.005 % ophthalmic solution Place 1 drop into both eyes at bedtime. 07/14/18   [provider]  Multiple Vitamins-Minerals (PRESERVISION AREDS PO) Take 1 tablet by mouth 2 (two) times daily.     [provider]  potassium chloride 20 MEQ TBCR Take 1 tablet (20 mEq total) by mouth 2 (two) times daily. 01/08/23   Kuneff, Renee A, DO  Probiotic Product (PROBIOTIC PO) Take 1 tablet by mouth daily.    [provider]  tamsulosin (FLOMAX) 0.4 MG CAPS capsule Take 0.4 mg by mouth in the morning and at bedtime.    [provider]  timolol (TIMOPTIC) 0.5 % ophthalmic solution Place 1 drop into both eyes 2 (two) times daily.  10/06/18   [provider]  UNABLE TO FIND Take 2 tablets by mouth every morning. Med Name: [name] (takes place of Co  Q 10)    [provider]      Allergies    Lisinopril, Penicillins, and Vancomycin    Review of Systems   Review of Systems  Eyes:  Negative for visual disturbance.  Gastrointestinal:  Negative for nausea and vomiting.  Skin:  Positive for wound.  Neurological:  Positive for headaches. Negative for dizziness, weakness and light-headedness.    Physical Exam Updated Vital Signs BP (!) 124/52   Pulse (!) 59   Temp 97.6 F (36.4 C) (Oral)   Resp 16   SpO2 96%  Physical Exam Vitals and nursing note reviewed.  Constitutional:      General: He is not in acute distress.    Appearance: Normal appearance. He is not ill-appearing or diaphoretic.  HENT:      Head: Normocephalic. Abrasion (in center of hematoma) and mass present. No Battle's sign or laceration.   Eyes:     General: Vision grossly intact.     Extraocular Movements: Extraocular movements intact.     Pupils: Pupils are equal, round, and reactive to light.  Cardiovascular:     Rate and Rhythm: Normal rate and regular rhythm.  Pulmonary:     Effort: Pulmonary effort is normal.  Skin:    General: Skin is warm and dry.     Capillary Refill: Capillary refill takes less than 2 seconds.  Neurological:     Mental Status: He is alert. Mental status is at baseline.     GCS: GCS eye subscore is 4. GCS verbal subscore is 5. GCS motor subscore is 6.     Sensory: Sensation is intact.     Motor: Motor function is intact.     Comments: Patient moving extremities appropriately.  Psychiatric:        Mood and Affect: Mood normal.        Behavior: Behavior normal.     ED Results / Procedures / Treatments   Labs (all labs ordered are listed, but only abnormal results are displayed) Labs Reviewed - No data to display  EKG None  Radiology CT Cervical Spine Wo Contrast  Result Date: 02/15/2023 CLINICAL DATA:  Neck trauma.  Larey Seat getting into vehicle today. EXAM: CT CERVICAL SPINE WITHOUT CONTRAST TECHNIQUE: Multidetector CT imaging of the cervical spine was performed without intravenous contrast. Multiplanar CT image reconstructions were also generated. RADIATION DOSE REDUCTION: This exam was performed according to the departmental dose-optimization program which includes automated exposure control, adjustment of the mA and/or kV according to patient size and/or use of iterative reconstruction technique. COMPARISON:  CT of the cervical spine 08/16/2017 FINDINGS: Alignment: No significant listhesis is present. Straightening and some reversal of the normal cervical lordosis is present. Skull base and vertebrae: The craniocervical junction is normal. Vertebral body heights are normal. Acute or  healing fractures are present. Soft tissues and spinal canal: No prevertebral fluid or swelling. No visible canal hematoma. Surgical clips are present in the right neck. Atherosclerotic calcifications are present at carotid bifurcations bilaterally. Disc levels: Multilevel degenerative changes are present in the cervical spine. Moderate foraminal stenosis is present bilaterally at C3-4. Moderate left foraminal narrowing is present C4-5. Moderate right foraminal narrowing is present at C5-6. Moderate to severe left foraminal stenosis is present at C6-7. Upper chest: A left pleural effusion is present. The lungs are otherwise clear. IMPRESSION: 1. No acute fracture or traumatic subluxation. 2. Multilevel degenerative changes of the cervical spine as described. 3. Left pleural effusion. Recommend chest radiograph for further evaluation of  possible chest trauma. Electronically Signed   By: Marin Roberts M.D.   On: 02/15/2023 12:20   CT Head Wo Contrast  Result Date: 02/15/2023 CLINICAL DATA:  Fall getting into a vehicle. Laceration to the head. EXAM: CT HEAD WITHOUT CONTRAST TECHNIQUE: Contiguous axial images were obtained from the base of the skull through the vertex without intravenous contrast. RADIATION DOSE REDUCTION: This exam was performed according to the departmental dose-optimization program which includes automated exposure control, adjustment of the mA and/or kV according to patient size and/or use of iterative reconstruction technique. COMPARISON:  MR head without and with contrast 01/12/2023 FINDINGS: Brain: No acute infarct, hemorrhage, or mass lesion is present. 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 Moderate generalized atrophy and white matter disease is stable. The deep brain nuclei are otherwise within normal limits. The ventricles are proportionate to the degree of atrophy. No significant extraaxial fluid collection is present. The brainstem and cerebellum are within  normal limits. Midline structures are within normal limits. Vascular: Atherosclerotic calcifications are present within the cavernous internal carotid arteries and at the dural margin of both vertebral arteries bilaterally. No hyperdense vessel is present. Skull: And occipital scalp hematoma is present. No underlying fracture or foreign body is present. Sinuses/Orbits: A fluid level present in the right maxillary sinus without associated fracture. The paranasal sinuses and mastoid air cells are otherwise clear. Bilateral lens replacements are noted. Globes and orbits are otherwise unremarkable. IMPRESSION: 1. Occipital scalp hematoma without underlying fracture or foreign body. 2. No acute intracranial abnormality or significant interval change. 3. Stable atrophy and white matter disease. This likely reflects the sequela of chronic microvascular ischemia. 4. Stable remote infarct of the left corona radiata. Electronically Signed   By: Marin Roberts M.D.   On: 02/15/2023 12:12    Procedures Procedures    Medications Ordered in ED Medications - No data to display  ED Course/ Medical Decision Making/ A&P Clinical Course as of 02/15/23 1310  Sun Feb 15, 2023  1306 This is a 89 year old gentleman on Eliquis for A-fib presented ED with a mechanical fall today, with an isolated injury towards the back of the head with a large occipital hematoma.  CT imaging with no emergent findings here in the ED.  Patient is at baseline mental status per family members at bedside.  His hematoma was dressed with a pressure bandage, no active bleeding at this time.  I advised continued ice and compression at home and we will provide referral to the wound care clinic given the size of this hematoma, higher likelihood for skin necrosis or breakdown.  I also advised that the patient hold the next dose of Eliquis this evening and then resume again tomorrow.  He and his family verbalized understanding.  He is stable for  discharge [MT]    Clinical Course User Index [MT] Trifan, Kermit Balo, MD                                 Medical Decision Making Amount and/or Complexity of Data Reviewed Radiology: ordered.   This patient presents to the ED with chief complaint(s) of head injury after fall with pertinent past medical history of chronic anticoagulation with Eliquis.  The complaint involves an extensive differential diagnosis and also carries with it a high risk of complications and morbidity.    The differential diagnosis includes acute intracranial injury, scalp hematoma, acute fracture or subluxation  Initial Assessment:   Exam significant for well-appearing patient who is alert and oriented.  Normal ROM of the neck without pain.  Large occipital hematoma appreciated on exam.  After wound was cleaned, there is no laceration.  Small abrasion in center of hematoma with mild bleeding, easily controlled with pressure.  No palpable skull fractures.    Independent interpretation of imaging: CT without acute intracranial finding and no cervical fracture or subluxation.  There is an occipital scalp hematoma without underlying fracture.  Treatment and Reassessment: Patient's wound was cleansed and dressed.  No laceration requiring repair.   Disposition:   Ambulatory referral placed for wound care center for hematoma follow up as it may require evacuation in future and close monitoring.  Patient advised to hold Eliquis dose tonight and resume normal dosing tomorrow.  Wound care discussed with patient.  Patient and family members at bedside verbalized their understanding.    The patient has been appropriately medically screened and/or stabilized in the ED. I have low suspicion for any other emergent medical condition which would require further screening, evaluation or treatment in the ED or require inpatient management. At time of discharge the patient is hemodynamically stable and in no acute distress. I have  discussed work-up results and diagnosis with patient and answered all questions. Patient is agreeable with discharge plan. We discussed strict return precautions for returning to the emergency department and they verbalized understanding.            Final Clinical Impression(s) / ED Diagnoses Final diagnoses:  Hematoma of occipital region of scalp  Injury due to fall, initial encounter    Rx / DC Orders ED Discharge Orders          Ordered    AMB referral to wound care center       Comments: Large occipital hematoma may require evacuation in future and close monitoring   02/15/23 1304              Lenard Simmer, PA-C 02/15/23 1311    Terald Sleeper, MD 02/15/23 1327

## 2023-02-15 NOTE — ED Triage Notes (Addendum)
Pt accompanied by daughter. Pt reports getting in SUV to go to church. Put left leg in vehicle and right leg gave away causing pt to fall and land on back of head. Laceration to back of head. Large hematoma noted Pt is on eliquis . Bleeding controlled Denies LOC. Neurologically intact

## 2023-02-15 NOTE — ED Notes (Signed)
D/c paperwork reviewed with pt, including follow up care.  All questions and/or concerns addressed at time of d/c.  No further needs expressed. . Pt verbalized understanding, Wheeled by ED staff with family to ED exit, NAD.

## 2023-02-15 NOTE — ED Notes (Signed)
Irrigated wound, PA observed wound before applying Kerlix head wrap, nonstick bandage and medipore tape, supplies given to family for home use.

## 2023-02-15 NOTE — Discharge Instructions (Addendum)
Thank you for allowing [place] to be a part of your care today.  Your CT scans were negative for injury to your brain, neck, or skull.  You do have a large hematoma (the swelling) on the back of your head.  We have placed an ambulatory referral to wound care for follow up.   Keep your wound clean and monitor for signs of infection including redness, increased warmth, drainage, or worsening swelling.  If you develop signs of infection, seek medical attention immediately.   If your wound begins bleeding, hold pressure for at least 15-20 minutes to help stop the bleeding.  Because you are on a blood thinner, pressure must be held on a wound longer.   Hold your Eliquis dose tonight. You may resume taking your normal amount tomorrow.  Return to the ED if you develop sudden worsening of symptoms or have new concerns.

## 2023-02-17 ENCOUNTER — Ambulatory Visit: Payer: Medicare Other | Attending: Cardiovascular Disease | Admitting: Cardiovascular Disease

## 2023-02-17 ENCOUNTER — Encounter: Payer: Self-pay | Admitting: Cardiovascular Disease

## 2023-02-17 VITALS — BP 106/50 | HR 67 | Ht 67.0 in | Wt 150.0 lb

## 2023-02-17 DIAGNOSIS — Z951 Presence of aortocoronary bypass graft: Secondary | ICD-10-CM | POA: Diagnosis not present

## 2023-02-17 DIAGNOSIS — I5032 Chronic diastolic (congestive) heart failure: Secondary | ICD-10-CM | POA: Diagnosis not present

## 2023-02-17 DIAGNOSIS — Z950 Presence of cardiac pacemaker: Secondary | ICD-10-CM | POA: Diagnosis not present

## 2023-02-17 DIAGNOSIS — I482 Chronic atrial fibrillation, unspecified: Secondary | ICD-10-CM | POA: Diagnosis not present

## 2023-02-17 NOTE — Patient Instructions (Signed)
Medication Instructions:  Your physician recommends that you continue on your current medications as directed. Please refer to the Current Medication list given to you today.  Labwork: NONE  Testing/Procedures: NONE  Follow-Up: Your physician wants you to follow-up in: 6 months with Dr. Eden Emms. You will receive a reminder letter in the mail two months in advance. If you don't receive a letter, please call our office to schedule the follow-up appointment.   If you need a refill on your cardiac medications before your next appointment, please call your pharmacy.  Diet & Lifestyle recommendations:  Physical activity recommendation (The Physical Activity Guidelines for Americans. JAMA 2018;Nov 12) At least 150-300 minutes a week of moderate-intensity, or 75-150 minutes a week of vigorous-intensity aerobic physical activity, or an equivalent combination of moderate- and vigorous-intensity aerobic activity. Adults should perform muscle-strengthening activities on 2 or more days a week. Older adults should do multicomponent physical activity that includes balance training as well as aerobic and muscle-strengthening activities. Benefits of increased physical activity include lower risk of mortality including cardiovascular mortality, lower risk of cardiovascular events and associated risk factors (hypertension and diabetes), and lower risk of many cancers (including bladder, breast, colon, endometrium, esophagus, kidney, lung, and stomach). Additional improvments have been seen in cognition, risk of dementia, anxiety and depression, improved bone health, lower risk of falls, and associated injuries.  Dietary recommendation The 2019 ACC/AHA guidelines promote nutrition as a main fixture of cardiovascular wellness, with a recommendation for a varied diet of fruit, vegetables, fish, legumes, and whole grains (Class I), as well as recommendations to reduce sodium, cholesterol, processed meats, and refined  sugars (Class IIa recommendation).10 Sodium intake, a topic of some controversy as of late, is recommended to be kept at 1,500 mg/day or less, far below the average daily intake in the [place] of 3,409 mg/day, and notably below that of previous [place] recommendations for 300mg /day.10,11 For those unable to reach 1,500 mg/day, they recommend at least a reduction of 1000 mg/day.  A Pesco-Mediterranean Diet With Intermittent Fasting: JACC Review Topic of the Week. J Am Coll Cardiol 2020;76:1484-1493 Pesco-Mediterranean diet, it is supplemented with extra-virgin olive oil (EVOO), which is the principle fat source, along with moderate amounts of dairy (particularly yogurt and cheese) and eggs, as well as modest amounts of alcohol consumption (ideally red wine with the evening meal), but few red and processed meats.

## 2023-02-23 DIAGNOSIS — H353134 Nonexudative age-related macular degeneration, bilateral, advanced atrophic with subfoveal involvement: Secondary | ICD-10-CM | POA: Diagnosis not present

## 2023-02-25 ENCOUNTER — Telehealth: Payer: Self-pay | Admitting: Family Medicine

## 2023-02-25 ENCOUNTER — Ambulatory Visit: Payer: Medicare Other | Admitting: Family Medicine

## 2023-02-25 NOTE — Telephone Encounter (Signed)
Patients daughter called [name] may be under [name] previous name before marriage. However, she called to inquire about a MOST form. She is asking for this to be filled out prior to her father needing to sign the form.  Please give [name]  daughter a call at [phone] to discuss the process. She is aware that [name] will need to sign the form.

## 2023-02-26 ENCOUNTER — Encounter (HOSPITAL_BASED_OUTPATIENT_CLINIC_OR_DEPARTMENT_OTHER): Payer: Medicare Other | Attending: Internal Medicine | Admitting: General Surgery

## 2023-02-26 DIAGNOSIS — Z87891 Personal history of nicotine dependence: Secondary | ICD-10-CM | POA: Insufficient documentation

## 2023-02-26 DIAGNOSIS — Z950 Presence of cardiac pacemaker: Secondary | ICD-10-CM | POA: Diagnosis not present

## 2023-02-26 DIAGNOSIS — Z7901 Long term (current) use of anticoagulants: Secondary | ICD-10-CM | POA: Insufficient documentation

## 2023-02-26 DIAGNOSIS — I4821 Permanent atrial fibrillation: Secondary | ICD-10-CM | POA: Diagnosis not present

## 2023-02-26 DIAGNOSIS — M109 Gout, unspecified: Secondary | ICD-10-CM | POA: Diagnosis not present

## 2023-02-26 DIAGNOSIS — I110 Hypertensive heart disease with heart failure: Secondary | ICD-10-CM | POA: Insufficient documentation

## 2023-02-26 DIAGNOSIS — L98499 Non-pressure chronic ulcer of skin of other sites with unspecified severity: Secondary | ICD-10-CM | POA: Insufficient documentation

## 2023-02-26 DIAGNOSIS — Z8551 Personal history of malignant neoplasm of bladder: Secondary | ICD-10-CM | POA: Insufficient documentation

## 2023-02-26 DIAGNOSIS — I5032 Chronic diastolic (congestive) heart failure: Secondary | ICD-10-CM | POA: Diagnosis not present

## 2023-02-26 DIAGNOSIS — S0180XA Unspecified open wound of other part of head, initial encounter: Secondary | ICD-10-CM | POA: Diagnosis not present

## 2023-02-26 DIAGNOSIS — I2510 Atherosclerotic heart disease of native coronary artery without angina pectoris: Secondary | ICD-10-CM | POA: Insufficient documentation

## 2023-02-27 NOTE — Progress Notes (Signed)
CALIEB, LICHTMAN [name] ([id number]) [id number] Page 1 of 4 Visit Report for 02/26/2023 Abuse Risk Screen Details Patient Name: Date of Service: [name], [name] [name]. 02/26/2023 8:00 A M Medical Record Number: [id number] Patient Account Number: [id number] Date of Birth/Sex: Treating RN: [birth year] (89 y.o. [name] Primary Care Maili Shutters: Felix Pacini Other Clinician: Referring Xayne Brumbaugh: Treating Mikiah Durall/Extender: Duanne Guess CLA RK, MEGHA N Weeks in Treatment: 0 Abuse Risk Screen Items Answer ABUSE RISK SCREEN: Has anyone close to you tried to hurt or harm you recentlyo No Do you feel uncomfortable with anyone in your familyo No Has anyone forced you do things that you didnt want to doo No Electronic Signature(s) Signed: 02/26/2023 4:34:06 PM By: Redmond Pulling RN, BSN Entered By: Redmond Pulling on 02/26/2023 08:40:26 -------------------------------------------------------------------------------- Activities of Daily Living Details Patient Name: Date of Service: [name], [name] [name]. 02/26/2023 8:00 A M Medical Record Number: [id number] Patient Account Number: [id number] Date of Birth/Sex: Treating RN: 10-Jun-1928 (89 y.o. [name] Primary Care Jevon Shells: Felix Pacini Other Clinician: Referring Arhaan Chesnut: Treating Allaya Abbasi/Extender: Duanne Guess CLA RK, MEGHA N Weeks in Treatment: 0 Activities of Daily Living Items Answer Activities of Daily Living (Please select one for each item) Drive Automobile Not Able T Medications ake Need Assistance Use T elephone Need Assistance Care for Appearance Completely Able Use T oilet Completely Able Bath / Shower Completely Able Dress Self Completely Able Feed Self Completely Able Walk Completely Able Get In / Out Bed Completely Able Housework Completely Able Prepare Meals Completely Able Handle Money Need Assistance Shop for Self Need Assistance Electronic Signature(s) Signed:  02/26/2023 4:34:06 PM By: Redmond Pulling RN, BSN Entered By: Redmond Pulling on 02/26/2023 08:40:56 [name] ([id number]) [id number] Page 2 of 4 -------------------------------------------------------------------------------- Education Screening Details Patient Name: Date of Service: [name], [name] [name]. 02/26/2023 8:00 A M Medical Record Number: [id number] Patient Account Number: [id number] Date of Birth/Sex: Treating RN: November 05, 1928 (89 y.o. [name] Primary Care Allyssia Skluzacek: Felix Pacini Other Clinician: Referring Gurshaan Matsuoka: Treating Jalyah Weinheimer/Extender: Janelle Floor, Alfonso Ellis in Treatment: 0 Primary Learner Assessed: Patient Learning Preferences/Education Level/Primary Language Learning Preference: Explanation, Demonstration, Printed Material Preferred Language: English Cognitive Barrier Language Barrier: No Translator Needed: No Memory Deficit: No Emotional Barrier: No Cultural/Religious Beliefs Affecting Medical Care: No Physical Barrier Impaired Vision: Yes AMD Impaired Hearing: Yes Hearing Aid Decreased Hand dexterity: No Knowledge/Comprehension Knowledge Level: High Comprehension Level: High Ability to understand written instructions: High Ability to understand verbal instructions: High Motivation Anxiety Level: Calm Cooperation: Cooperative Education Importance: Acknowledges Need Interest in Health Problems: Asks Questions Perception: Coherent Willingness to Engage in Self-Management High Activities: Readiness to Engage in Self-Management High Activities: Electronic Signature(s) Signed: 02/26/2023 4:34:06 PM By: Redmond Pulling RN, BSN Entered By: Redmond Pulling on 02/26/2023 08:41:33 -------------------------------------------------------------------------------- Fall Risk Assessment Details Patient Name: Date of Service: [name], [name]. 02/26/2023 8:00 A M Medical Record Number:  [id number] Patient Account Number: [id number] Date of Birth/Sex: Treating RN: [birth year] (89 y.o. [name] Primary Care Joory Gough: Felix Pacini Other Clinician: Referring Trueman Worlds: Treating [name]/Extender: Stacy Gardner RK, MEGHA N Weeks in Treatment: 0 Fall Risk Assessment Items Have you had 2 or more falls in the last 12 monthso 0 Yes Have you had any fall that resulted in injury in the last [address] [name] ([id number]) [id number] Page 3 of 4 FALLS RISK SCREEN History of falling - immediate or within 3  months 25 Yes Secondary diagnosis (Do you have 2 or more medical diagnoseso) 15 Yes Ambulatory aid None/bed rest/wheelchair/nurse 0 Yes Crutches/cane/walker 0 No Furniture 0 No Intravenous therapy Access/Saline/Heparin Lock 0 No Gait/Transferring Normal/ bed rest/ wheelchair 0 Yes Weak (short steps with or without shuffle, stooped but able to lift head while walking, may seek 0 No support from furniture) Impaired (short steps with shuffle, may have difficulty arising from chair, head down, impaired 0 No balance) Mental Status Oriented to own ability 0 Yes Electronic Signature(s) Signed: 02/26/2023 4:34:06 PM By: Redmond Pulling RN, BSN Entered By: Redmond Pulling on 02/26/2023 08:41:49 -------------------------------------------------------------------------------- Foot Assessment Details Patient Name: Date of Service: [name], [name]. 02/26/2023 8:00 A M Medical Record Number: [id number] Patient Account Number: [id number] Date of Birth/Sex: Treating RN: 1928/05/12 (89 y.o. [name] Primary Care Smt Lokey: Felix Pacini Other Clinician: Referring Maxten Shuler: Treating Parag Dorton/Extender: Duanne Guess CLA RK, MEGHA N Weeks in Treatment: 0 Foot Assessment Items Site Locations + = Sensation present, - = Sensation absent, C = Callus, U = Ulcer R = Redness, W = Warmth, M = Maceration, PU =  Pre-ulcerative lesion F = Fissure, S = Swelling, [name] = Dryness Assessment Right: Left: Other Deformity: No No Prior Foot Ulcer: No No Prior Amputation: No No Charcot Joint: No No Ambulatory Status: Ambulatory Without Help GaitBURLON, CENTRELLA ([id number]) [id number] Page 4 of 4 Electronic Signature(s) Signed: 02/26/2023 4:34:06 PM By: Redmond Pulling RN, BSN Entered By: Redmond Pulling on 02/26/2023 08:42:29 -------------------------------------------------------------------------------- Nutrition Risk Screening Details Patient Name: Date of Service: HO 39, [name]. 02/26/2023 8:00 A M Medical Record Number: [id number] Patient Account Number: [id number] Date of Birth/Sex: Treating RN: 1929/02/02 (89 y.o. [name] Primary Care Lovetta Condie: Felix Pacini Other Clinician: Referring Icelynn Onken: Treating Amnah Breuer/Extender: Duanne Guess CLA RK, MEGHA N Weeks in Treatment: 0 Height (in): Weight (lbs): Body Mass Index (BMI): Nutrition Risk Screening Items Score Screening NUTRITION RISK SCREEN: I have an illness or condition that made me change the kind and/or amount of food I eat 0 No I eat fewer than two meals per day 0 No I eat few fruits and vegetables, or milk products 0 No I have three or more drinks of beer, liquor or wine almost every day 0 No I have tooth or mouth problems that make it hard for me to eat 0 No I don't always have enough money to buy the food I need 0 No I eat alone most of the time 0 No I take three or more different prescribed or over-the-counter drugs a day 1 Yes Without wanting to, I have lost or gained 10 pounds in the last six months 0 No I am not always physically able to shop, cook and/or feed myself 0 No Nutrition Protocols Good Risk Protocol Moderate Risk Protocol High Risk Proctocol Risk Level: Good Risk Score: 1 Electronic Signature(s) Signed: 02/26/2023 4:34:06 PM By: Redmond Pulling RN,  BSN Entered By: Redmond Pulling on 02/26/2023 08:42:07

## 2023-02-27 NOTE — Progress Notes (Signed)
SEQUOYAH, COUNTERMAN ([id number]) [id number] Page 1 of 6 Visit Report for 02/26/2023 Allergy List Details Patient Name: Date of Service: [name], [name] GER D. 02/26/2023 8:00 A M Medical Record Number: [id number] Patient Account Number: [id number] Date of Birth/Sex: Treating RN: [birth year] (89 y.o. [name] Primary Care Beola Vasallo: Felix Pacini Other Clinician: Referring Lively Haberman: Treating Naman Spychalski/Extender: Duanne Guess CLA RK, MEGHA N Weeks in Treatment: 0 Allergies Active Allergies lisinopril penicillin vancomycin Allergy Notes Electronic Signature(s) Signed: 02/26/2023 4:34:06 PM By: Redmond Pulling RN, BSN Entered By: Redmond Pulling on 02/26/2023 08:25:33 -------------------------------------------------------------------------------- Arrival Information Details Patient Name: Date of Service: [name], [name] 02/26/2023 8:00 A M Medical Record Number: [id number] Patient Account Number: [id number] Date of Birth/Sex: Treating RN: [birth year], [name] Primary Care Dayani Winbush: Felix Pacini Other Clinician: Referring Sheilah Rayos: Treating Dam Ashraf/Extender: Stacy Gardner RK, MEGHA N Weeks in Treatment: 0 Visit Information Patient Arrived: Ambulatory Arrival Time: 08:19 Accompanied By: daughter Transfer Assistance: None Patient Identification Verified: Yes Secondary Verification Process Completed: Yes Patient Has Alerts: Yes Patient Alerts: Patient on Blood Thinner Electronic Signature(s) Signed: 02/26/2023 4:34:06 PM By: Redmond Pulling RN, BSN Entered By: Redmond Pulling on 02/26/2023 08:21:39 Rockey Situ ([id number]) [id number] Page 2 of 6 -------------------------------------------------------------------------------- Clinic Level of Care Assessment Details Patient Name: Date of Service: [name], [name] GER D. 02/26/2023 8:00 A M Medical Record Number: [id number] Patient Account  Number: [id number] Date of Birth/Sex: Treating RN: [birth year] (89 y.o. [name] Primary Care Wilson Dusenbery: Felix Pacini Other Clinician: Referring Lynnley Doddridge: Treating Osric Klopf/Extender: Duanne Guess CLA RK, MEGHA N Weeks in Treatment: 0 Clinic Level of Care Assessment Items TOOL 1 Quantity Score X- 1 0 Use when EandM and Procedure is performed on INITIAL visit ASSESSMENTS - Nursing Assessment / Reassessment X- 1 20 General Physical Exam (combine w/ comprehensive assessment (listed just below) when performed on new pt. evals) X- 1 25 Comprehensive Assessment (HX, ROS, Risk Assessments, Wounds Hx, etc.) ASSESSMENTS - Wound and Skin Assessment / Reassessment []  - 0 Dermatologic / Skin Assessment (not related to wound area) ASSESSMENTS - Ostomy and/or Continence Assessment and Care []  - 0 Incontinence Assessment and Management []  - 0 Ostomy Care Assessment and Management (repouching, etc.) PROCESS - Coordination of Care X - Simple Patient / Family Education for ongoing care 1 15 []  - 0 Complex (extensive) Patient / Family Education for ongoing care X- 1 10 Staff obtains Chiropractor, Records, T Results / Process Orders est []  - 0 Staff telephones HHA, Nursing Homes / Clarify orders / etc []  - 0 Routine Transfer to another Facility (non-emergent condition) []  - 0 Routine Hospital Admission (non-emergent condition) X- 1 15 New Admissions / Manufacturing engineer / Ordering NPWT Apligraf, etc. , []  - 0 Emergency Hospital Admission (emergent condition) PROCESS - Special Needs []  - 0 Pediatric / Minor Patient Management []  - 0 Isolation Patient Management []  - 0 Hearing / Language / Visual special needs []  - 0 Assessment of Community assistance (transportation, D/C planning, etc.) []  - 0 Additional assistance / Altered mentation []  - 0 Support Surface(s) Assessment (bed, cushion, seat, etc.) INTERVENTIONS - Miscellaneous []  - 0 External ear exam []  - 0 Patient  Transfer (multiple staff / Nurse, adult / Similar devices) []  - 0 Simple Staple / Suture removal (25 or less) []  - 0 Complex Staple / Suture removal (26 or more) []  - 0 Hypo/Hyperglycemic Management (do not check if billed separately) []  - 0 Ankle / Brachial Index (ABI) -  do not check if billed separately Has the patient been seen at the hospital within the last three years: Yes Total Score: 85 Level Of Care: New/Established - Level 3 Electronic Signature(s) Signed: 02/26/2023 4:34:06 PM By: Redmond Pulling RN, BSN Entered By: Redmond Pulling on 02/26/2023 12:00:09 [name] ([id number]) [id number] Page 3 of 6 -------------------------------------------------------------------------------- Encounter Discharge Information Details Patient Name: Date of Service: [name], [name] GER D. 02/26/2023 8:00 A M Medical Record Number: [id number] Patient Account Number: [id number] Date of Birth/Sex: Treating RN: [birth year] (89 y.o. [name] Primary Care Lameeka Schleifer: Felix Pacini Other Clinician: Referring Cache Bills: Treating Marykate Heuberger/Extender: Stacy Gardner RK, MEGHA N Weeks in Treatment: 0 Encounter Discharge Information Items Post Procedure Vitals Discharge Condition: Stable Temperature (F): 97.7 Ambulatory Status: Ambulatory Pulse (bpm): 62 Discharge Destination: Home Respiratory Rate (breaths/min): 18 Transportation: Private Auto Blood Pressure (mmHg): 144/67 Accompanied By: daughter Schedule Follow-up Appointment: Yes Clinical Summary of Care: Patient Declined Electronic Signature(s) Signed: 02/26/2023 4:34:06 PM By: Redmond Pulling RN, BSN Entered By: Redmond Pulling on 02/26/2023 12:01:25 -------------------------------------------------------------------------------- Lower Extremity Assessment Details Patient Name: Date of Service: [name], [name] 02/26/2023 8:00 A M Medical Record Number: [id number] Patient Account Number:  [id number] Date of Birth/Sex: Treating RN: [birth year], William (89 y.o. [name] Primary Care Kano Heckmann: Felix Pacini Other Clinician: Referring Deandre Brannan: Treating Tyleigh Mahn/Extender: Stacy Gardner RK, MEGHA N Weeks in Treatment: 0 Electronic Signature(s) Signed: 02/26/2023 4:34:06 PM By: Redmond Pulling RN, BSN Entered By: Redmond Pulling on 02/26/2023 08:42:39 -------------------------------------------------------------------------------- Multi-Disciplinary Care Plan Details Patient Name: Date of Service: [name], [name] 02/26/2023 8:00 A M Medical Record Number: [id number] Patient Account Number: [id number] Date of Birth/Sex: Treating RN: [birth year] (89 y.o. [name] Primary Care Dae Highley: Felix Pacini Other Clinician: Referring Myron Stankovich: Treating Shamanda Len/Extender: Stacy Gardner RK, MEGHA N Weeks in Treatment: 0 Active Inactive Abuse / Safety / Falls / Self Care Management Nursing Diagnoses: KAENAN, JAKE ([id number]) [phone] Page 4 of 6 Potential for falls Goals: Patient/caregiver will verbalize/demonstrate measures taken to prevent injury and/or falls Date Initiated: 02/26/2023 Target Resolution Date: 03/26/2023 Goal Status: Active Interventions: Assess fall risk on admission and as needed Assess: immobility, friction, shearing, incontinence upon admission and as needed Assess impairment of mobility on admission and as needed per policy Assess self care needs on admission and as needed Provide education on fall prevention Notes: Orientation to the Wound Care Program Nursing Diagnoses: Knowledge deficit related to the wound healing center program Goals: Patient/caregiver will verbalize understanding of the Wound Healing Center Program Date Initiated: 02/26/2023 Target Resolution Date: 03/05/2023 Goal Status: Active Interventions: Provide education on orientation to the wound center Notes: Wound/Skin  Impairment Nursing Diagnoses: Impaired tissue integrity Knowledge deficit related to ulceration/compromised skin integrity Goals: Patient/caregiver will verbalize understanding of skin care regimen Date Initiated: 02/26/2023 Target Resolution Date: 03/26/2023 Goal Status: Active Ulcer/skin breakdown will have a volume reduction of 30% by week 4 Date Initiated: 02/26/2023 Target Resolution Date: 03/26/2023 Goal Status: Active Interventions: Assess patient/caregiver ability to obtain necessary supplies Assess patient/caregiver ability to perform ulcer/skin care regimen upon admission and as needed Assess ulceration(s) every visit Provide education on ulcer and skin care Notes: Electronic Signature(s) Signed: 02/26/2023 4:34:06 PM By: Redmond Pulling RN, BSN Entered By: Redmond Pulling on 02/26/2023 09:09:59 -------------------------------------------------------------------------------- Patient/Caregiver Education Details Patient Name: Date of Service: [name], [name] 10/31/2024andnbsp8:00 A M Medical Record Number: [id number] Patient Account Number: [id number] Date of Birth/Gender: Treating RN: [birth year] (89 y.o.  [name] Primary Care Physician: Felix Pacini Other Clinician: Referring Physician: Treating Physician/Extender: Stacy Gardner RK, Alfonso Ellis in Treatment: 0 Education [address] BRAYDAN, MARRIOTT ([id number]) [id number] Page 5 of 6 Education Provided To: Patient Education Topics Provided Welcome T [facility]-New Patient Packet: o Methods: Explain/Verbal Responses: State content correctly Wound/Skin Impairment: Methods: Explain/Verbal Responses: State content correctly Electronic Signature(s) Signed: 02/26/2023 4:34:06 PM By: Redmond Pulling RN, BSN Entered By: Redmond Pulling on 02/26/2023 09:10:40 -------------------------------------------------------------------------------- Wound Assessment  Details Patient Name: Date of Service: [name], [name] 02/26/2023 8:00 A M Medical Record Number: [id number] Patient Account Number: [id number] Date of Birth/Sex: Treating RN: [birth year] (89 y.o. [name] Primary Care Dock Baccam: Felix Pacini Other Clinician: Referring Anastazia Creek: Treating Tanyiah Laurich/Extender: Duanne Guess CLA RK, MEGHA N Weeks in Treatment: 0 Wound Status Wound Number: 1 Primary Trauma, Other Etiology: Wound Location: Head - occiput Wound Open Wounding Event: Hematoma Status: Date Acquired: 02/15/2023 Comorbid Arrhythmia, Congestive Heart Failure, Coronary Artery Weeks Of Treatment: 0 History: Disease, Hypertension, Gout Clustered Wound: No Photos Wound Measurements Length: (cm) 3 Width: (cm) 2.3 Depth: (cm) 0.1 Area: (cm) 5.419 Volume: (cm) 0.542 % Reduction in Area: % Reduction in Volume: Epithelialization: None Tunneling: No Undermining: No Wound Description Classification: Unclassifiable Exudate Amount: Medium Exudate Type: Serosanguineous Exudate Color: red, brown Foul Odor After Cleansing: No Slough/Fibrino Yes Wound Bed Granulation Amount: None Present (0%) Exposed Structure Necrotic Amount: Large (67-100%) Fascia Exposed: No KRISTI, HYER D ([id number]) [id number] Page 6 of 6 Necrotic Quality: Eschar Fat Layer (Subcutaneous Tissue) Exposed: Yes Tendon Exposed: No Muscle Exposed: No Joint Exposed: No Bone Exposed: No Periwound Skin Texture Texture Color No Abnormalities Noted: No No Abnormalities Noted: No Moisture No Abnormalities Noted: No Treatment Notes Wound #1 (Head - occiput) Cleanser Soap and Water Discharge Instruction: May shower and wash wound with dial antibacterial soap and water prior to dressing change. Peri-Wound Care Topical Primary Dressing Secondary Dressing Woven Gauze Sponges 2x2 in Discharge Instruction: Apply over primary dressing as directed. Secured With 32M  Medipore H Soft Cloth Surgical T ape, 4 x 10 (in/yd) Discharge Instruction: Secure with tape as directed. Compression Wrap Compression Stockings Add-Ons Electronic Signature(s) Signed: 02/26/2023 4:34:06 PM By: Redmond Pulling RN, BSN Entered By: Redmond Pulling on 02/26/2023 08:48:54 -------------------------------------------------------------------------------- Vitals Details Patient Name: Date of Service: [name], [name] 02/26/2023 8:00 A M Medical Record Number: [id number] Patient Account Number: [id number] Date of Birth/Sex: Treating RN: [birth year] (89 y.o. [name] Primary Care Tiane Szydlowski: Felix Pacini Other Clinician: Referring Davinity Fanara: Treating Braheem Tomasik/Extender: Duanne Guess CLA RK, MEGHA N Weeks in Treatment: 0 Vital Signs Time Taken: 08:23 Temperature (F): 97.7 Pulse (bpm): 62 Respiratory Rate (breaths/min): 18 Blood Pressure (mmHg): 144/67 Reference Range: 80 - 120 mg / dl Electronic Signature(s) Signed: 02/26/2023 4:34:06 PM By: Redmond Pulling RN, BSN Entered By: Redmond Pulling on 02/26/2023 08:24:02

## 2023-03-06 ENCOUNTER — Encounter: Payer: Self-pay | Admitting: Family Medicine

## 2023-03-06 ENCOUNTER — Ambulatory Visit (INDEPENDENT_AMBULATORY_CARE_PROVIDER_SITE_OTHER): Payer: Medicare Other | Admitting: Family Medicine

## 2023-03-06 VITALS — BP 132/62 | HR 63 | Wt 150.0 lb

## 2023-03-06 DIAGNOSIS — Z0289 Encounter for other administrative examinations: Secondary | ICD-10-CM | POA: Diagnosis not present

## 2023-03-06 DIAGNOSIS — Z7189 Other specified counseling: Secondary | ICD-10-CM | POA: Diagnosis not present

## 2023-03-06 NOTE — Progress Notes (Signed)
[name] , 13-Nov-1928, 89 y.o., male MRN: [id number] Patient Care Team    Relationship Specialty Notifications Start End  Natalia Leatherwood, DO PCP - General Family Medicine  12/29/22   Wendall Stade, MD PCP - Cardiology Cardiology  03/17/17   Hillis Range, MD (Inactive) PCP - Electrophysiology Cardiology  06/26/21   Janet Berlin, MD Consulting Physician Ophthalmology  12/18/15   Mathews Robinsons, MD Consulting Physician Dermatology  12/18/15   Wendall Stade, MD Consulting Physician Cardiology  12/18/15   Stephannie Li, MD Consulting Physician Ophthalmology  12/19/15   Josph Macho, MD Consulting Physician Oncology  09/30/16   Arita Miss, MD Attending Physician Nephrology  09/30/16   Donzetta Starch, MD Consulting Physician Dermatology  09/30/16   Marcine Matar, MD Consulting Physician Urology  06/30/19   Merwyn Katos, DPM Consulting Physician Podiatry  06/30/19     Chief Complaint  Patient presents with   Advanced care planning     Subjective: [name] is a 89 y.o. Pt presents for an OV to discuss advanced care planning /counseling and complete MOST form with him.      09/17/2022    2:12 PM 07/22/2022    8:53 AM 05/29/2022    9:52 AM 07/12/2021    6:28 PM 07/12/2021    6:26 PM  Depression screen PHQ 2/9  Decreased Interest 0 0 0 0 0  Down, Depressed, Hopeless 0 0 0 0 0  PHQ - 2 Score 0 0 0 0 0    Allergies  Allergen Reactions   Lisinopril Other (See Comments)     he has difficulty controlling potassium on ace/arb- lisinopril was dc'd in the past 2/2 to hyperkalemia documented by [facility] PCP 01/2021   Penicillins Hives    Has patient had a PCN reaction causing immediate rash, facial/tongue/throat swelling, SOB or lightheadedness with hypotension: No Has patient had a PCN reaction causing severe rash involving mucus membranes or skin necrosis: Yes Has patient had a PCN reaction that required hospitalization: No Has patient had a PCN reaction occurring  within the last 10 years: No If all of the above answers are "NO", then may proceed with Cephalosporin use.    Vancomycin Other (See Comments)    Edema and myalgia   Social History   Social History Narrative   Married [place], 2 children Ethelene Browns and [name].   Some college. Retired from [employer].   Drinks caffeine, uses herbal remedies, takes a daily vitamin.   Wears his seatbelt, smoke detector at home, firearms in the home.   Wears a hearing aid.   Feels safe in her relationships.   Past Medical History:  Diagnosis Date   A-fib [name])    Basal cell carcinoma    skin   Bigeminal rhythm    Bradycardia 06/21/2017   Cancer of anterior wall of urinary bladder (HCC) 07/18/2019   Cataract    Chronic renal insufficiency, stage 3 (moderate) (HCC) 2018   GFR 30s-40s   Coronary artery disease    post bypass   CVD (cardiovascular disease)    Diabetes mellitus without complication (HCC)    type 2 diet controlled   Diverticular disease    Easy bruising    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   Gout    Hematuria 06/30/2019   Hernia    Hyperkalemia 05/06/2017   Hyperlipidemia    Hypertension    IBS (irritable bowel syndrome)    Iron deficiency  anemia 02/04/2016   Macular degeneration    Microscopic colitis    PVD (peripheral vascular disease) [facility])    Past Surgical History:  Procedure Laterality Date   CARDIAC CATHETERIZATION  2006   CARDIOVERSION N/A 02/22/2013   Procedure: CARDIOVERSION;  Surgeon: Wendall Stade, MD;  Location: [facility] ENDOSCOPY;  Service: Cardiovascular;  Laterality: N/A;   CARDIOVERSION N/A 11/15/2014   Procedure: CARDIOVERSION;  Surgeon: Wendall Stade, MD;  Location: [facility] ENDOSCOPY;  Service: Cardiovascular;  Laterality: N/A;   CARDIOVERSION N/A 04/01/2017   Procedure: CARDIOVERSION;  Surgeon: Laurey Morale, MD;  Location: [facility] ENDOSCOPY;  Service: Cardiovascular;  Laterality: N/A;   CAROTID ENDARTERECTOMY  2009/ 1993   left/ right   COLONOSCOPY   03/17/2005   The colon is normal.   CORONARY ARTERY BYPASS GRAFT  1999   CYSTOSCOPY W/ RETROGRADES Bilateral 07/14/2019   Procedure: CYSTOSCOPY WITH RETROGRADE PYELOGRAM;  Surgeon: Marcine Matar, MD;  Location: [facility];  Service: Urology;  Laterality: Bilateral;   ESOPHAGOGASTRODUODENOSCOPY  03/17/2005   Normal esophagus. Normal Stomanch. Normal duodenum.    EYE SURGERY     eyelid repair   INGUINAL HERNIA REPAIR  02/11/2012   Procedure: LAPAROSCOPIC BILATERAL INGUINAL HERNIA REPAIR;  Surgeon: Valarie Merino, MD;  Location: [facility] ORS;  Service: General;  Laterality: Bilateral;   PACEMAKER IMPLANT N/A 07/28/2017   St Jude Medical Assurity MRI conditional  dual-chamber pacemaker for symptomatic second degree AV block by Dr Johney Frame   SKIN CANCER EXCISION     right ear x 3   TRANSURETHRAL RESECTION OF BLADDER TUMOR N/A 09/05/2019   Procedure: TRANSURETHRAL RESECTION OF BLADDER TUMOR (TURBT);  Surgeon: Marcine Matar, MD;  Location: [facility];  Service: Urology;  Laterality: N/A;   TRANSURETHRAL RESECTION OF BLADDER TUMOR WITH MITOMYCIN-C N/A 07/14/2019   Procedure: TRANSURETHRAL RESECTION OF BLADDER TUMOR WITH GEMCITABINE IN PACU;  Surgeon: Marcine Matar, MD;  Location: [facility];  Service: Urology;  Laterality: N/A;   Family History  Problem Relation Age of Onset   Stroke Mother    Coronary artery disease Father    Heart disease Father    Melanoma Sister    Colon cancer Neg Hx    Allergies as of 03/06/2023       Reactions   Lisinopril Other (See Comments)    he has difficulty controlling potassium on ace/arb- lisinopril was dc'd in the past 2/2 to hyperkalemia documented by [facility] PCP 01/2021   Penicillins Hives   Has patient had a PCN reaction causing immediate rash, facial/tongue/throat swelling, SOB or lightheadedness with hypotension: No Has patient had a PCN reaction causing severe rash involving mucus membranes or skin  necrosis: Yes Has patient had a PCN reaction that required hospitalization: No Has patient had a PCN reaction occurring within the last 10 years: No If all of the above answers are "NO", then may proceed with Cephalosporin use.   Vancomycin Other (See Comments)   Edema and myalgia        Medication List        Accurate as of March 06, 2023  1:53 PM. If you have any questions, ask your nurse or doctor.          allopurinol 100 MG tablet Commonly known as: ZYLOPRIM Take 1 tablet (100 mg total) by mouth daily.   amLODipine 5 MG tablet Commonly known as: NORVASC Take 1 tablet (5 mg total) by mouth at bedtime.   atorvastatin 20 MG tablet Commonly  known as: LIPITOR Take 1 tablet (20 mg total) by mouth at bedtime.   carvedilol 6.25 MG tablet Commonly known as: COREG Take 1 tablet (6.25 mg total) by mouth 2 (two) times daily with a meal.   Cholecalciferol 125 MCG (5000 UT) capsule Take 5,000 Units by mouth daily.   dicyclomine 10 MG capsule Commonly known as: BENTYL Take 1 capsule (10 mg total) by mouth every 6 (six) hours as needed.   Eliquis 2.5 MG Tabs tablet Generic drug: apixaban TAKE 1 TABLET BY MOUTH TWICE A DAY   finasteride 5 MG tablet Commonly known as: PROSCAR Take 5 mg by mouth daily.   fish oil-omega-3 fatty acids 1000 MG capsule Take 1 g by mouth daily.   fluticasone 50 MCG/ACT nasal spray Commonly known as: FLONASE Place 2 sprays into both nostrils daily.   furosemide 40 MG tablet Commonly known as: LASIX Take 1 tablet (40 mg total) by mouth 2 (two) times daily.   latanoprost 0.005 % ophthalmic solution Commonly known as: XALATAN Place 1 drop into both eyes at bedtime.   onetouch ultrasoft lancets Use to check sugar daily as instructed.   OneTouch Verio test strip Generic drug: glucose blood Use to check sugar daily as instructed.   Potassium Chloride ER 20 MEQ Tbcr Take 1 tablet (20 mEq total) by mouth 2 (two) times daily.    PRESERVISION AREDS PO Take 1 tablet by mouth 2 (two) times daily.   PROBIOTIC PO Take 1 tablet by mouth daily.   tamsulosin 0.4 MG Caps capsule Commonly known as: FLOMAX Take 0.4 mg by mouth in the morning and at bedtime.   timolol 0.5 % ophthalmic solution Commonly known as: TIMOPTIC Place 1 drop into both eyes 2 (two) times daily.   UNABLE TO FIND Take 2 tablets by mouth every morning. Med Name: [name] (takes place of Co Q 10)        All past medical history, surgical history, allergies, family history, immunizations andmedications were updated in the EMR today and reviewed under the history and medication portions of their EMR.     ROS Negative, with the exception of above mentioned in HPI   Objective:  BP 132/62   Pulse 63   Wt 150 lb (68 kg)   SpO2 94%   BMI 23.49 kg/m  Body mass index is 23.49 kg/m. Physical Exam Vitals and nursing note reviewed.  Constitutional:      General: He is not in acute distress.    Appearance: Normal appearance. He is not ill-appearing, toxic-appearing or diaphoretic.  HENT:     Head: Normocephalic and atraumatic.  Eyes:     General: No scleral icterus.       Right eye: No discharge.        Left eye: No discharge.     Extraocular Movements: Extraocular movements intact.     Pupils: Pupils are equal, round, and reactive to light.  Skin:    General: Skin is warm and dry.     Coloration: Skin is not jaundiced or pale.     Findings: No rash.  Neurological:     Mental Status: He is alert and oriented to person, place, and time. Mental status is at baseline.  Psychiatric:        Mood and Affect: Mood normal.        Behavior: Behavior normal.        Thought Content: Thought content normal.        Judgment: Judgment normal.  No results found. No results found. No results found for this or any previous visit (from the past 24 hour(s)).  Assessment/Plan: [name] is a 89 y.o. male present for OV for  MOST form  completion-Advanced care planning/counseling discussion We discussed and completed MOST form together today.  Patient verbalized full understanding of the questions and his answers.  He was allowed to review his answers and read form in its entirety, prior to signing.  Patient signed form today in front of myself and his daughter. Original copy return to him for his records.  He understands to keep this on him at all times, especially with any hospitalizations. Copy was made to upload into patient's chart. -encouraged him to bring in a copy of his Advanced directives/living will/HCPOA (and his wife's, which is also our pt) Follow-up per his routine chronic condition appointment  Reviewed expectations re: course of current medical issues. Discussed self-management of symptoms. Outlined signs and symptoms indicating need for more acute intervention. Patient verbalized understanding and all questions were answered. Patient received an After-Visit Summary.    No orders of the defined types were placed in this encounter.  No orders of the defined types were placed in this encounter.  Referral Orders  No referral(s) requested today     Note is dictated utilizing voice recognition software. Although note has been proof read prior to signing, occasional typographical errors still can be missed. If any questions arise, please do not hesitate to call for verification.   electronically signed by:  Felix Pacini, DO  [facility] [place]

## 2023-03-12 ENCOUNTER — Encounter (HOSPITAL_BASED_OUTPATIENT_CLINIC_OR_DEPARTMENT_OTHER): Payer: Medicare Other | Attending: General Surgery | Admitting: General Surgery

## 2023-03-12 DIAGNOSIS — Z7901 Long term (current) use of anticoagulants: Secondary | ICD-10-CM | POA: Insufficient documentation

## 2023-03-12 DIAGNOSIS — Z8249 Family history of ischemic heart disease and other diseases of the circulatory system: Secondary | ICD-10-CM | POA: Insufficient documentation

## 2023-03-12 DIAGNOSIS — S0180XA Unspecified open wound of other part of head, initial encounter: Secondary | ICD-10-CM | POA: Diagnosis not present

## 2023-03-12 DIAGNOSIS — L98499 Non-pressure chronic ulcer of skin of other sites with unspecified severity: Secondary | ICD-10-CM | POA: Insufficient documentation

## 2023-03-12 DIAGNOSIS — I2510 Atherosclerotic heart disease of native coronary artery without angina pectoris: Secondary | ICD-10-CM | POA: Insufficient documentation

## 2023-03-12 DIAGNOSIS — I5032 Chronic diastolic (congestive) heart failure: Secondary | ICD-10-CM | POA: Diagnosis not present

## 2023-03-12 DIAGNOSIS — I110 Hypertensive heart disease with heart failure: Secondary | ICD-10-CM | POA: Insufficient documentation

## 2023-03-12 DIAGNOSIS — Z950 Presence of cardiac pacemaker: Secondary | ICD-10-CM | POA: Diagnosis not present

## 2023-03-12 DIAGNOSIS — I4821 Permanent atrial fibrillation: Secondary | ICD-10-CM | POA: Diagnosis not present

## 2023-03-12 DIAGNOSIS — Z8551 Personal history of malignant neoplasm of bladder: Secondary | ICD-10-CM | POA: Insufficient documentation

## 2023-03-12 NOTE — Progress Notes (Addendum)
RONITH, RAPER ([id number]) [id number] Page 1 of 6 Visit Report for 03/12/2023 Chief Complaint Document Details Patient Name: Date of Service: HO Clovia Cuff, [name] GER D. 03/12/2023 11:15 A M Medical Record Number: [id number] Patient Account Number: [id number] Date of Birth/Sex: Treating RN: [birth year] (89 y.o. M) Primary Care Provider: Felix Pacini Other Clinician: Referring Provider: Treating Provider/Extender: Nada Boozer Weeks in Treatment: 2 Information Obtained from: Patient Chief Complaint Patient seen for complaints of Non-Healing Wound. Electronic Signature(s) Signed: 03/12/2023 11:38:32 AM By: Duanne Guess MD FACS Entered By: Duanne Guess on 03/12/2023 11:38:32 -------------------------------------------------------------------------------- HPI Details Patient Name: Date of Service: [name], [name] 03/12/2023 11:15 A M Medical Record Number: [id number] Patient Account Number: [id number] Date of Birth/Sex: Treating RN: 06/02/1928 (89 y.o. M) Primary Care Provider: Felix Pacini Other Clinician: Referring Provider: Treating Provider/Extender: Rodman Comp, Renee Weeks in Treatment: 2 History of Present Illness HPI Description: ADMISSION 02/26/2023 This is a 89 year old man with permanent atrial fibrillation on chronic anticoagulation with Eliquis. He also has congestive heart failure and coronary artery disease. He suffered a mechanical fall on October 20 during which he struck the back of his head. He was evaluated in the emergency room. There were no emergent findings on imaging. He did have a large occipital hematoma. The hematoma was dressed with a pressure dressing and he was referred to the wound care center due to the size of the hematoma and concern for skin necrosis or breakdown going forward. 03/12/2023: The hematoma has decreased in size substantially. The bruising on the back of his neck is also  improving. There is some scab-like eschar on the surface of the hematoma. No concern for infection. Electronic Signature(s) Signed: 03/12/2023 11:39:31 AM By: Duanne Guess MD FACS Entered By: Duanne Guess on 03/12/2023 11:39:31 -------------------------------------------------------------------------------- Physical Exam Details Patient Name: Date of Service: HO Clovia Cuff, [name] 03/12/2023 11:15 A M Medical Record Number: [id number] Patient Account Number: [id number] AUSTAN, ASHABRANNER ([id number]) [id number] Page 2 of 6 Date of Birth/Sex: Treating RN: [birth year] (89 y.o. M) Primary Care Provider: Felix Pacini Other Clinician: Referring Provider: Treating Provider/Extender: Rodman Comp, Renee Weeks in Treatment: 2 Constitutional . . . . no acute distress. Respiratory Normal work of breathing on room air.. Notes 03/12/2023: The hematoma has decreased in size substantially. The bruising on the back of his neck is also improving. There is some scab-like eschar on the surface of the hematoma. No concern for infection. Electronic Signature(s) Signed: 03/12/2023 11:40:10 AM By: Duanne Guess MD FACS Entered By: Duanne Guess on 03/12/2023 11:40:10 -------------------------------------------------------------------------------- Physician Orders Details Patient Name: Date of Service: HO Clovia Cuff, [name] 03/12/2023 11:15 A M Medical Record Number: [id number] Patient Account Number: [id number] Date of Birth/Sex: Treating RN: [birth year] (89 y.o. 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 - Dr. Lady Gary Anesthetic Wound #1 Head - occiput (In clinic) Topical Lidocaine 5% applied to wound bed Bathing/ Shower/ Hygiene May shower and wash wound with soap and water. Wound Treatment Wound #1 - Head - occiput Cleanser: Soap and Water  Discharge Instructions: May shower and wash wound with dial antibacterial soap and water prior to dressing change. Secondary Dressing: Woven Gauze Sponges 2x2 in Discharge Instructions: Apply over primary dressing as needed Secured With: 27M Medipore H Soft Cloth Surgical T ape, 4 x 10 (in/yd) Discharge Instructions: Secure with tape as directed. Electronic Signature(s) Signed: 03/12/2023 11:43:24 AM By: Duanne Guess MD Maurice Small D ([id number]) [id number] Page 3 of 6 Entered By: Duanne Guess on 03/12/2023 11:43:24 -------------------------------------------------------------------------------- Problem List Details Patient Name: Date of Service: HO Rennis Petty GER D. 03/12/2023 11:15 A M Medical Record Number: [id number] Patient Account Number: [id number] Date of Birth/Sex: Treating RN: [birth year] (89 y.o. M) Primary Care Provider: Felix Pacini Other Clinician: Referring Provider: Treating Provider/Extender: Rodman Comp, Renee Weeks in Treatment: 2 Active Problems ICD-10 Encounter Code Description Active Date MDM Diagnosis L98.499 Non-pressure chronic ulcer of skin of other sites with unspecified severity 02/26/2023 No Yes Z79.01 Long term (current) use of anticoagulants 02/26/2023 No Yes I50.32 Chronic diastolic (congestive) heart failure 02/26/2023 No Yes Inactive Problems Resolved Problems Electronic Signature(s) Signed: 03/12/2023 11:32:48 AM By: Duanne Guess MD  FACS Previous Signature: 03/12/2023 11:16:41 AM Version By: Duanne Guess MD FACS Entered By: Duanne Guess on 03/12/2023 11:32:48 -------------------------------------------------------------------------------- Progress Note Details Patient Name: Date of Service: [name], [name] 03/12/2023 11:15 A M Medical Record Number: [id number] Patient Account Number: [id number] Date of Birth/Sex: Treating RN: 1929-01-09 (89 y.o. M) Primary Care Provider: Felix Pacini Other Clinician: Referring Provider: Treating Provider/Extender: Nada Boozer Weeks in Treatment: 2 Subjective Chief Complaint Information obtained from Patient Patient seen for complaints of Non-Healing Wound. History of Present Illness (HPI) ADMISSION 02/26/2023 This is a 89 year old man with permanent atrial fibrillation on chronic anticoagulation with Eliquis. He also has congestive heart failure and coronary artery disease. He suffered a mechanical fall on October 20 during which he struck the back of his head. He was evaluated in the emergency room. There were no emergent findings on imaging. He did have a large occipital hematoma. The hematoma was dressed with a pressure dressing and he was referred to the wound CLOUDE, BITTEL D ([id number]) [id number] Page 4 of 6 care center due to the size of the hematoma and concern for skin necrosis or breakdown going forward. 03/12/2023: The hematoma has decreased in size substantially. The bruising on the back of his neck is also improving. There is some scab-like eschar on the surface of the hematoma. No concern for infection. Patient History Information obtained from Patient. Family History Heart Disease - Father, Stroke - Mother. Social History Former smoker - 40+years ago, Marital Status - Married, Alcohol Use - Never, Drug Use - No History, Caffeine Use - Daily. Medical History Cardiovascular Patient has history of  Arrhythmia - A-fib, Congestive Heart Failure, Coronary Artery Disease, Hypertension Musculoskeletal Patient has history of Gout Medical A Surgical History Notes nd Eyes AMD Cardiovascular pacemaker Endocrine pre-diabetic Genitourinary bladder cancer - surgically removed 2021 Oncologic bladder cancer 2021 Objective Constitutional no acute distress. Vitals Time Taken: 11:06 AM, Temperature: 98.1 F, Pulse: 67 bpm, Respiratory Rate: 18 breaths/min, Blood Pressure: 112/61 mmHg. Respiratory Normal work of breathing on room air.. General Notes: 03/12/2023: The hematoma has decreased in size substantially. The bruising on the back of his neck is also improving. There is some scab-like eschar on the surface of the hematoma. No concern for infection. Integumentary (Hair, Skin) Wound #1 status is Open. Original cause of wound was Hematoma. The date acquired was: 02/15/2023.  The wound has been in treatment 2 weeks. The wound is located on the Head - occiput. The wound measures 1cm length x 1.9cm width x 0.1cm depth; 1.492cm^2 area and 0.149cm^3 volume. There is Fat Layer (Subcutaneous Tissue) exposed. There is no tunneling or undermining noted. There is a none present amount of drainage noted. The wound margin is flat and intact. There is no granulation within the wound bed. There is a large (67-100%) amount of necrotic tissue within the wound bed including Eschar. The periwound skin appearance had no abnormalities noted for texture. The periwound skin appearance had no abnormalities noted for moisture. The periwound skin appearance had no abnormalities noted for color. Periwound temperature was noted as No Abnormality. The periwound has tenderness on palpation. Assessment Active Problems ICD-10 Non-pressure chronic ulcer of skin of other sites with unspecified severity Long term (current) use of anticoagulants Chronic diastolic (congestive) heart failure Plan Follow-up Appointments: Return  appointment in 1 month. - Dr. Lady Gary Anesthetic: [name] ([id number]) [id number] Page 5 of 6 Wound #1 Head - occiput: (In clinic) Topical Lidocaine 5% applied to wound bed Bathing/ Shower/ Hygiene: May shower and wash wound with soap and water. WOUND #1: - Head - occiput Wound Laterality: Cleanser: Soap and Water Discharge Instructions: May shower and wash wound with dial antibacterial soap and water prior to dressing change. Secondary Dressing: Woven Gauze Sponges 2x2 in Discharge Instructions: Apply over primary dressing as needed Secured With: 17M Medipore H Soft Cloth Surgical T ape, 4 x 10 (in/yd) Discharge Instructions: Secure with tape as directed. 03/12/2023: The hematoma has decreased in size substantially. The bruising on the back of his neck is also improving. There is some scab-like eschar on the surface of the hematoma. No concern for infection. I elected not to perform any debridement today as I think this will heal up on its own without my intervention. He should wash the area when he washes his hair and may keep a dry gauze over it in case there is any drainage. We will have him follow-up in about a month just to be certain that everything has resolved well. Should there be any change to the site, such as redness, warmth, increased tenderness, or other signs of infection, he will contact the clinic for a sooner visit. Electronic Signature(s) Signed: 03/12/2023 11:44:37 AM By: Duanne Guess MD FACS Entered By: Duanne Guess on 03/12/2023 11:44:36 -------------------------------------------------------------------------------- HxROS Details Patient Name: Date of Service: [name], [name] 03/12/2023 11:15 A M Medical Record Number: [id number] Patient Account Number: [id number] Date of Birth/Sex: Treating RN: 1929/02/13 (89 y.o. M) Primary Care Provider: Felix Pacini Other Clinician: Referring Provider: Treating  Provider/Extender: Nada Boozer Weeks in Treatment: 2 Information Obtained From Patient Eyes Medical History: Past Medical History Notes: AMD Cardiovascular Medical History: Positive for: Arrhythmia - A-fib; Congestive Heart Failure; Coronary Artery Disease; Hypertension Past Medical History Notes: pacemaker Endocrine Medical History: Past Medical History Notes: pre-diabetic Genitourinary Medical History: Past Medical History Notes: bladder cancer - surgically removed 2021 Musculoskeletal Medical History: Positive for: Gout Oncologic HARMON, DEIGHAN ([id number]) [id number] Page 6 of 6 Medical History: Past Medical History Notes: bladder cancer 2021 Immunizations Pneumococcal Vaccine: Received Pneumococcal Vaccination: Yes Received Pneumococcal Vaccination On or After 60th Birthday: Yes Implantable Devices Yes Family and Social History Heart Disease: Yes - Father; Stroke: Yes - Mother; Former smoker - 40+years ago; Marital Status - Married; Alcohol Use: Never; Drug Use: No History; Caffeine Use: Daily; Financial Concerns: No; Food, Clothing  or Shelter Needs: No; Support System Lacking: No; Transportation Concerns: No Electronic Signature(s) Signed: 03/12/2023 3:32:46 PM By: Duanne Guess MD FACS Entered By: Duanne Guess on 03/12/2023 11:39:43 -------------------------------------------------------------------------------- SuperBill Details Patient Name: Date of Service: [name], [name] 03/12/2023 Medical Record Number: [id number] Patient Account Number: [id number] Date of Birth/Sex: Treating RN: 1929/03/03 (89 y.o. Damaris Schooner Primary Care Provider: Felix Pacini Other Clinician: Referring Provider: Treating Provider/Extender: Nada Boozer Weeks in Treatment: 2 Diagnosis Coding ICD-10 Codes Code Description L98.499 Non-pressure chronic ulcer of skin of other sites with  unspecified severity Z79.01 Long term (current) use of anticoagulants I50.32 Chronic diastolic (congestive) heart failure Facility Procedures : CPT4 Code: [id number] Description: 96295 - WOUND CARE VISIT-LEV 2 EST PT Modifier: Quantity: 1 Physician Procedures : CPT4 Code Description Modifier [id number] - WC PHYS LEVEL 2 - EST PT ICD-10 Diagnosis Description L98.499 Non-pressure chronic ulcer of skin of other sites with unspecified severity Z79.01 Long term (current) use of anticoagulants I50.32 Chronic  diastolic (congestive) heart failure Quantity: 1 Electronic Signature(s) Signed: 03/12/2023 11:44:54 AM By: Duanne Guess MD FACS Entered By: Duanne Guess on 03/12/2023 11:44:54

## 2023-03-12 NOTE — Progress Notes (Addendum)
[name], William ([id number]) [id number] Page 1 of 8 Visit Report for 03/12/2023 Arrival Information Details Patient Name: Date of Service: [name], [name] [name]. 03/12/2023 11:15 A M Medical Record Number: [id number] Patient Account Number: [id number] Date of Birth/Sex: Treating RN: [birth year] (89 y.o. M) Primary Care Irish Piech: [name] Other Clinician: Referring [name]: Treating [name]/Extender: [name] in Treatment: 2 Visit Information History Since Last Visit Added or deleted any medications: No Patient Arrived: Cane Any new allergies or adverse reactions: No Arrival Time: 11:06 Had a fall or experienced change in No Accompanied By: daughter activities of daily living that may affect Transfer Assistance: None risk of falls: Patient Identification Verified: Yes Signs or symptoms of abuse/neglect since last visito No Secondary Verification Process Completed: Yes Hospitalized since last visit: No Patient Has Alerts: Yes Implantable device outside of the clinic excluding No Patient Alerts: Patient on Blood Thinner cellular tissue based products placed in the center since last visit: Has Dressing in Place as Prescribed: Yes Pain Present Now: No Electronic Signature(s) Signed: 03/12/2023 4:32:10 PM By: William Deed RN, BSN Previous Signature: 03/12/2023 11:17:53 AM Version By: [name] Entered By: [name] on 03/12/2023 08:22:32 -------------------------------------------------------------------------------- Clinic Level of Care Assessment Details Patient Name: Date of Service: HO 65, [name]. 03/12/2023 11:15 A M Medical Record Number: [id number] Patient Account Number: [id number] Date of Birth/Sex: Treating RN: 14-Oct-1928 (89 y.o. [name] Primary Care [name]: [name] Other Clinician: Referring [name]: Treating [name]/Extender: [name] [name] in  Treatment: 2 Clinic Level of Care Assessment Items TOOL 4 Quantity Score []  - 0 Use when only an EandM is performed on FOLLOW-UP visit ASSESSMENTS - Nursing Assessment / Reassessment X- 1 10 Reassessment of Co-morbidities (includes updates in patient status) X- 1 5 Reassessment of Adherence to Treatment Plan ASSESSMENTS - Wound and Skin A ssessment / Reassessment X - Simple Wound Assessment / Reassessment - one wound 1 5 []  - 0 Complex Wound Assessment / Reassessment - multiple wounds []  - 0 Dermatologic / Skin Assessment (not related to wound area) ASSESSMENTS - Focused Assessment []  - 0 Circumferential Edema Measurements - multi extremities []  - 0 Nutritional Assessment / Counseling / Intervention William, [name] ([id number]) [id number] Page 2 of 8 []  - 0 Lower Extremity Assessment (monofilament, tuning fork, pulses) []  - 0 Peripheral Arterial Disease Assessment (using hand held doppler) ASSESSMENTS - Ostomy and/or Continence Assessment and Care []  - 0 Incontinence Assessment and Management []  - 0 Ostomy Care Assessment and Management (repouching, etc.) PROCESS - Coordination of Care X - Simple Patient / Family Education for ongoing care 1 15 []  - 0 Complex (extensive) Patient / Family Education for ongoing care X- 1 10 Staff obtains Chiropractor, Records, T Results / Process Orders est []  - 0 Staff telephones HHA, Nursing Homes / Clarify orders / etc []  - 0 Routine Transfer to another Facility (non-emergent condition) []  - 0 Routine Hospital Admission (non-emergent condition) []  - 0 New Admissions / Manufacturing engineer / Ordering NPWT Apligraf, etc. , []  - 0 Emergency Hospital Admission (emergent condition) X- 1 10 Simple Discharge Coordination []  - 0 Complex (extensive) Discharge Coordination PROCESS - Special Needs []  - 0 Pediatric / Minor Patient Management []  - 0 Isolation Patient Management []  - 0 Hearing / Language /  Visual special needs []  - 0 Assessment of Community assistance (transportation, [name], etc.) []  - 0 Additional assistance / Altered mentation []  - 0 Support Surface(s) Assessment (bed,  cushion, seat, etc.) INTERVENTIONS - Wound Cleansing / Measurement X - Simple Wound Cleansing - one wound 1 5 []  - 0 Complex Wound Cleansing - multiple wounds X- 1 5 Wound Imaging (photographs - any number of wounds) []  - 0 Wound Tracing (instead of photographs) X- 1 5 Simple Wound Measurement - one wound []  - 0 Complex Wound Measurement - multiple wounds INTERVENTIONS - Wound Dressings []  - 0 Small Wound Dressing one or multiple wounds []  - 0 Medium Wound Dressing one or multiple wounds []  - 0 Large Wound Dressing one or multiple wounds []  - 0 Application of Medications - topical []  - 0 Application of Medications - injection INTERVENTIONS - Miscellaneous []  - 0 External ear exam []  - 0 Specimen Collection (cultures, biopsies, blood, body fluids, etc.) []  - 0 Specimen(s) / Culture(s) sent or taken to Lab for analysis []  - 0 Patient Transfer (multiple staff / Nurse, adult / Similar devices) []  - 0 Simple Staple / Suture removal (25 or less) []  - 0 Complex Staple / Suture removal (26 or more) []  - 0 Hypo / Hyperglycemic Management (close monitor of Blood Glucose) William, BARNHARDT [name] ([id number]) [id number] Page 3 of 8 []  - 0 Ankle / Brachial Index (ABI) - do not check if billed separately X- 1 5 Vital Signs Has the patient been seen at the hospital within the last three years: Yes Total Score: 75 Level Of Care: New/Established - Level 2 Electronic Signature(s) Signed: 03/12/2023 4:32:10 PM By: William Deed RN, BSN Entered By: [name] on 03/12/2023 08:37:03 -------------------------------------------------------------------------------- Encounter Discharge Information Details Patient Name: Date of Service: [name], [name]. 03/12/2023  11:15 A M Medical Record Number: [id number] Patient Account Number: [id number] Date of Birth/Sex: Treating RN: 13-Jul-1928 (89 y.o. [name] Primary Care Debar Plate: [name] Other Clinician: Referring Trevone Prestwood: Treating Ryian Lynde/Extender: Nehemiah Massed in Treatment: 2 Encounter Discharge Information Items Discharge Condition: Stable Ambulatory Status: Cane Discharge Destination: Home Transportation: Private Auto Accompanied By: daughter Schedule Follow-up Appointment: Yes Clinical Summary of Care: Patient Declined Electronic Signature(s) Signed: 03/12/2023 4:32:10 PM By: William Deed RN, BSN Entered By: [name] on 03/12/2023 08:37:53 -------------------------------------------------------------------------------- Lower Extremity Assessment Details Patient Name: Date of Service: [name], [name]. 03/12/2023 11:15 A M Medical Record Number: [id number] Patient Account Number: [id number] Date of Birth/Sex: Treating RN: Apr 17, 1929 (89 y.o. [name] Primary Care Billyjack Trompeter: [name] Other Clinician: Referring Gray Doering: Treating Hulan Szumski/Extender: [name] [name] in Treatment: 2 Electronic Signature(s) Signed: 03/12/2023 4:32:10 PM By: William Deed RN, BSN Entered By: [name] on 03/12/2023 08:65:78 -------------------------------------------------------------------------------- Multi Wound Chart Details Patient Name: Date of Service: [name], [name]. 03/12/2023 11:15 A M Medical Record Number: [id number] Patient Account Number: [id number] William, [name] ([id number]) [id number].pdf Page 4 of 8 Date of Birth/Sex: Treating RN: 20-Apr-1929 (89 y.o. M) Primary Care Zeno Hickel: [name] Other Clinician: Referring Vann Okerlund: Treating Albana Saperstein/Extender: [name] in Treatment: 2 Vital Signs Height(in): Pulse(bpm): 67 Weight(lbs): Blood  Pressure(mmHg): 112/61 Body Mass Index(BMI): Temperature(F): 98.1 Respiratory Rate(breaths/min): 18 [1:Photos:] [[name]/A:[name]/A] Head - occiput [name]/A [name]/A Wound Location: Hematoma [name]/A [name]/A Wounding Event: Trauma, Other [name]/A [name]/A Primary Etiology: Arrhythmia, Congestive Heart Failure, [name]/A [name]/A Comorbid History: Coronary Artery Disease, Hypertension, Gout 02/15/2023 [name]/A [name]/A Date Acquired: 2 [name]/A [name]/A [name] of Treatment: Open [name]/A [name]/A Wound Status: No [name]/A [name]/A Wound Recurrence: 1x1.9x0.1 [name]/A [name]/A Measurements L x W x [name] (cm) 1.492 [name]/A [name]/A A (cm) :  rea 0.149 [name]/A [name]/A Volume (cm) : 72.50% [name]/A [name]/A % Reduction in A rea: 72.50% [name]/A [name]/A % Reduction in Volume: Unclassifiable [name]/A [name]/A Classification: None Present [name]/A [name]/A Exudate A mount: Flat and Intact [name]/A [name]/A Wound Margin: None Present (0%) [name]/A [name]/A Granulation A mount: Large (67-100%) [name]/A [name]/A Necrotic A mount: Eschar [name]/A [name]/A Necrotic Tissue: Fat Layer (Subcutaneous Tissue): Yes [name]/A [name]/A Exposed Structures: Fascia: No Tendon: No Muscle: No Joint: No Bone: No None [name]/A [name]/A Epithelialization: No Abnormalities Noted [name]/A [name]/A Periwound Skin Texture: No Abnormalities Noted [name]/A [name]/A Periwound Skin Moisture: No Abnormalities Noted [name]/A [name]/A Periwound Skin Color: No Abnormality [name]/A [name]/A Temperature: Yes [name]/A [name]/A Tenderness on Palpation: Treatment Notes Electronic Signature(s) Signed: 03/12/2023 11:37:18 AM By: Duanne Guess MD FACS Previous Signature: 03/12/2023 11:16:54 AM Version By: Duanne Guess MD FACS Entered By: Duanne Guess on 03/12/2023 08:37:18 -------------------------------------------------------------------------------- Multi-Disciplinary Care Plan Details Patient Name: Date of Service: [name], [name]. 03/12/2023 11:15 A M Medical Record Number: [id number] Patient Account Number: [id number] Date of Birth/Sex: Treating RN: 07-05-1928 (89 y.o. William, [name], [name] ([id number])  [phone].pdf Page 5 of 8 Primary Care Shakevia Sarris: [name] Other Clinician: Referring Jobany Montellano: Treating Dartanion Teo/Extender: Nehemiah Massed in Treatment: 2 Multidisciplinary Care Plan reviewed with physician Active Inactive Abuse / Safety / Falls / Self Care Management Nursing Diagnoses: Potential for falls Goals: Patient/caregiver will verbalize/demonstrate measures taken to prevent injury and/or falls Date Initiated: 02/26/2023 Target Resolution Date: 03/26/2023 Goal Status: Active Interventions: Assess fall risk on admission and as needed Assess: immobility, friction, shearing, incontinence upon admission and as needed Assess impairment of mobility on admission and as needed per policy Assess self care needs on admission and as needed Provide education on fall prevention Notes: Wound/Skin Impairment Nursing Diagnoses: Impaired tissue integrity Knowledge deficit related to ulceration/compromised skin integrity Goals: Patient/caregiver will verbalize understanding of skin care regimen Date Initiated: 02/26/2023 Target Resolution Date: 03/26/2023 Goal Status: Active Ulcer/skin breakdown will have a volume reduction of 30% by week 4 Date Initiated: 02/26/2023 Target Resolution Date: 03/26/2023 Goal Status: Active Interventions: Assess patient/caregiver ability to obtain necessary supplies Assess patient/caregiver ability to perform ulcer/skin care regimen upon admission and as needed Assess ulceration(s) every visit Provide education on ulcer and skin care Notes: Electronic Signature(s) Signed: 03/12/2023 4:32:10 PM By: William Deed RN, BSN Entered By: [name] on 03/12/2023 08:29:16 -------------------------------------------------------------------------------- Pain Assessment Details Patient Name: Date of Service: [name], [name]. 03/12/2023 11:15 A M Medical Record Number: [id number] Patient Account  Number: [id number] Date of Birth/Sex: Treating RN: 17-Nov-1928 (89 y.o. [name] Primary Care Deanza Upperman: [name] Other Clinician: Referring Beatryce Colombo: Treating [employer]i [employer]/Extender: [name] [name] in Treatment: 2 Active Problems Location of Pain Severity and Description of Pain Patient Has William, [name] ([id number]) [phone].pdf Page 6 of 8 Patient Has Paino Yes Site Locations With Dressing Change: Yes Duration of the Pain. Constant / Intermittento Intermittent Rate the pain. Current Pain Level: 0 Worst Pain Level: 4 Character of Pain Describe the Pain: Tender Pain Management and Medication Current Pain Management: Electronic Signature(s) Signed: 03/12/2023 4:32:10 PM By: William Deed RN, BSN Entered By: [name] on 03/12/2023 08:23:20 -------------------------------------------------------------------------------- Patient/Caregiver Education Details Patient Name: Date of Service: [name], [name]. 11/14/2024andnbsp11:15 A M Medical Record Number: [id number] Patient Account Number: [id number] Date of Birth/Gender: Treating RN: 12-27-1928 (89 y.o. [name] Primary Care Physician: [name] Other Clinician: Referring Physician: Treating  Physician/Extender: [name] [name] in Treatment: 2 Education Assessment Education Provided To: Patient Education Topics Provided Wound/Skin Impairment: Methods: Explain/Verbal Responses: Reinforcements needed, State content correctly Electronic Signature(s) Signed: 03/12/2023 4:32:10 PM By: William Deed RN, BSN Entered By: [name] on 03/12/2023 08:30:05 Wound Assessment Details -------------------------------------------------------------------------------- [name] ([id number]) [id number] Page 7 of 8 Patient Name: Date of Service: [name], [name] [name]. 03/12/2023 11:15 A  M Medical Record Number: [id number] Patient Account Number: [id number] Date of Birth/Sex: Treating RN: Oct 28, 1928 (89 y.o. M) Primary Care Kimani Bedoya: [name] Other Clinician: Referring Areon Cocuzza: Treating Nikodem Leadbetter/Extender: [name] in Treatment: 2 Wound Status Wound Number: 1 Primary Trauma, Other Etiology: Wound Location: Head - occiput Wound Open Wounding Event: Hematoma Status: Date Acquired: 02/15/2023 Comorbid Arrhythmia, Congestive Heart Failure, Coronary Artery [name] Of Treatment: 2 History: Disease, Hypertension, Gout Clustered Wound: No Photos Wound Measurements Length: (cm) 1 % Reduction in Area: 72.5% Width: (cm) 1.9 % Reduction in Volume: 72.5% Depth: (cm) 0.1 Epithelialization: None Area: (cm) 1.492 Tunneling: No Volume: (cm) 0.149 Undermining: No Wound Description Classification: Unclassifiable Foul Odor After Cleansing: No Wound Margin: Flat and Intact Slough/Fibrino No Exudate Amount: None Present Wound Bed Granulation Amount: None Present (0%) Exposed Structure Necrotic Amount: Large (67-100%) Fascia Exposed: No Necrotic Quality: Eschar Fat Layer (Subcutaneous Tissue) Exposed: Yes Tendon Exposed: No Muscle Exposed: No Joint Exposed: No Bone Exposed: No Periwound Skin Texture Texture Color No Abnormalities Noted: Yes No Abnormalities Noted: Yes Moisture Temperature / Pain No Abnormalities Noted: Yes Temperature: No Abnormality Tenderness on Palpation: Yes Treatment Notes Wound #1 (Head - occiput) Cleanser Soap and Water Discharge Instruction: May shower and wash wound with dial antibacterial soap and water prior to dressing change. Peri-Wound Care Topical Primary Dressing Secondary Dressing Woven Gauze Sponges 2x2 in Discharge Instruction: Apply over primary dressing as needed William, [name] ([id number]) [phone].pdf Page 8 of 8 Secured With 58M Medipore H Soft Cloth Surgical T  ape, 4 x 10 (in/yd) Discharge Instruction: Secure with tape as directed. Compression Wrap Compression Stockings Add-Ons Electronic Signature(s) Signed: 03/12/2023 4:32:10 PM By: William Deed RN, BSN Previous Signature: 03/12/2023 11:17:53 AM Version By: [name] Entered By: [name] on 03/12/2023 08:24:26 -------------------------------------------------------------------------------- Vitals Details Patient Name: Date of Service: [name], [name]. 03/12/2023 11:15 A M Medical Record Number: [id number] Patient Account Number: [id number] Date of Birth/Sex: Treating RN: [birth year] (89 y.o. M) Primary Care Adalea Handler: [name] Other Clinician: Referring Thorne Wirz: Treating Dexton Zwilling/Extender: [name] in Treatment: 2 Vital Signs Time Taken: 11:06 Temperature (F): 98.1 Pulse (bpm): 67 Respiratory Rate (breaths/min): 18 Blood Pressure (mmHg): 112/61 Reference Range: 80 - 120 mg / dl Electronic Signature(s) Signed: 03/12/2023 4:32:10 PM By: William Deed RN, BSN Previous Signature: 03/12/2023 11:17:53 AM Version By: [name] Entered By: [name] on 03/12/2023 08:22:52

## 2023-03-17 ENCOUNTER — Ambulatory Visit (HOSPITAL_BASED_OUTPATIENT_CLINIC_OR_DEPARTMENT_OTHER): Payer: Medicare Other | Admitting: Internal Medicine

## 2023-03-24 DIAGNOSIS — I739 Peripheral vascular disease, unspecified: Secondary | ICD-10-CM | POA: Diagnosis not present

## 2023-03-24 DIAGNOSIS — L603 Nail dystrophy: Secondary | ICD-10-CM | POA: Diagnosis not present

## 2023-03-24 DIAGNOSIS — E1151 Type 2 diabetes mellitus with diabetic peripheral angiopathy without gangrene: Secondary | ICD-10-CM | POA: Diagnosis not present

## 2023-03-24 DIAGNOSIS — L84 Corns and callosities: Secondary | ICD-10-CM | POA: Diagnosis not present

## 2023-04-06 DIAGNOSIS — H353134 Nonexudative age-related macular degeneration, bilateral, advanced atrophic with subfoveal involvement: Secondary | ICD-10-CM | POA: Diagnosis not present

## 2023-04-16 ENCOUNTER — Ambulatory Visit (HOSPITAL_BASED_OUTPATIENT_CLINIC_OR_DEPARTMENT_OTHER): Payer: Medicare Other | Admitting: General Surgery

## 2023-04-18 ENCOUNTER — Emergency Department (HOSPITAL_BASED_OUTPATIENT_CLINIC_OR_DEPARTMENT_OTHER): Payer: Medicare Other

## 2023-04-18 ENCOUNTER — Encounter (HOSPITAL_BASED_OUTPATIENT_CLINIC_OR_DEPARTMENT_OTHER): Payer: Self-pay | Admitting: Emergency Medicine

## 2023-04-18 ENCOUNTER — Other Ambulatory Visit: Payer: Self-pay

## 2023-04-18 ENCOUNTER — Inpatient Hospital Stay (HOSPITAL_BASED_OUTPATIENT_CLINIC_OR_DEPARTMENT_OTHER)
Admission: EM | Admit: 2023-04-18 | Discharge: 2023-04-21 | DRG: 291 | Disposition: A | Discharge status: Home / Self Care | Payer: Medicare Other | Attending: Internal Medicine | Admitting: Internal Medicine

## 2023-04-18 DIAGNOSIS — Z951 Presence of aortocoronary bypass graft: Secondary | ICD-10-CM

## 2023-04-18 DIAGNOSIS — N400 Enlarged prostate without lower urinary tract symptoms: Secondary | ICD-10-CM | POA: Diagnosis present

## 2023-04-18 DIAGNOSIS — Z85828 Personal history of other malignant neoplasm of skin: Secondary | ICD-10-CM | POA: Diagnosis not present

## 2023-04-18 DIAGNOSIS — Z888 Allergy status to other drugs, medicaments and biological substances status: Secondary | ICD-10-CM | POA: Diagnosis not present

## 2023-04-18 DIAGNOSIS — E785 Hyperlipidemia, unspecified: Secondary | ICD-10-CM | POA: Diagnosis present

## 2023-04-18 DIAGNOSIS — D509 Iron deficiency anemia, unspecified: Secondary | ICD-10-CM | POA: Diagnosis present

## 2023-04-18 DIAGNOSIS — R55 Syncope and collapse: Secondary | ICD-10-CM | POA: Diagnosis not present

## 2023-04-18 DIAGNOSIS — Z66 Do not resuscitate: Secondary | ICD-10-CM | POA: Diagnosis present

## 2023-04-18 DIAGNOSIS — Z8551 Personal history of malignant neoplasm of bladder: Secondary | ICD-10-CM

## 2023-04-18 DIAGNOSIS — Z7901 Long term (current) use of anticoagulants: Secondary | ICD-10-CM | POA: Diagnosis not present

## 2023-04-18 DIAGNOSIS — N184 Chronic kidney disease, stage 4 (severe): Secondary | ICD-10-CM | POA: Diagnosis not present

## 2023-04-18 DIAGNOSIS — J90 Pleural effusion, not elsewhere classified: Secondary | ICD-10-CM | POA: Diagnosis not present

## 2023-04-18 DIAGNOSIS — I110 Hypertensive heart disease with heart failure: Secondary | ICD-10-CM | POA: Diagnosis not present

## 2023-04-18 DIAGNOSIS — I5043 Acute on chronic combined systolic (congestive) and diastolic (congestive) heart failure: Secondary | ICD-10-CM | POA: Diagnosis not present

## 2023-04-18 DIAGNOSIS — Z1152 Encounter for screening for COVID-19: Secondary | ICD-10-CM | POA: Diagnosis not present

## 2023-04-18 DIAGNOSIS — D500 Iron deficiency anemia secondary to blood loss (chronic): Secondary | ICD-10-CM

## 2023-04-18 DIAGNOSIS — I361 Nonrheumatic tricuspid (valve) insufficiency: Secondary | ICD-10-CM | POA: Diagnosis not present

## 2023-04-18 DIAGNOSIS — I480 Paroxysmal atrial fibrillation: Secondary | ICD-10-CM | POA: Diagnosis present

## 2023-04-18 DIAGNOSIS — Z881 Allergy status to other antibiotic agents status: Secondary | ICD-10-CM | POA: Diagnosis not present

## 2023-04-18 DIAGNOSIS — W19XXXA Unspecified fall, initial encounter: Secondary | ICD-10-CM

## 2023-04-18 DIAGNOSIS — Z79899 Other long term (current) drug therapy: Secondary | ICD-10-CM

## 2023-04-18 DIAGNOSIS — Z823 Family history of stroke: Secondary | ICD-10-CM

## 2023-04-18 DIAGNOSIS — D508 Other iron deficiency anemias: Secondary | ICD-10-CM

## 2023-04-18 DIAGNOSIS — I2510 Atherosclerotic heart disease of native coronary artery without angina pectoris: Secondary | ICD-10-CM | POA: Diagnosis present

## 2023-04-18 DIAGNOSIS — Z950 Presence of cardiac pacemaker: Secondary | ICD-10-CM

## 2023-04-18 DIAGNOSIS — E1151 Type 2 diabetes mellitus with diabetic peripheral angiopathy without gangrene: Secondary | ICD-10-CM | POA: Diagnosis not present

## 2023-04-18 DIAGNOSIS — Z880 Allergy status to penicillin: Secondary | ICD-10-CM | POA: Diagnosis not present

## 2023-04-18 DIAGNOSIS — R803 Bence Jones proteinuria: Secondary | ICD-10-CM | POA: Diagnosis not present

## 2023-04-18 DIAGNOSIS — I4891 Unspecified atrial fibrillation: Secondary | ICD-10-CM | POA: Diagnosis present

## 2023-04-18 DIAGNOSIS — Z87891 Personal history of nicotine dependence: Secondary | ICD-10-CM | POA: Diagnosis not present

## 2023-04-18 DIAGNOSIS — D631 Anemia in chronic kidney disease: Secondary | ICD-10-CM | POA: Diagnosis present

## 2023-04-18 DIAGNOSIS — D696 Thrombocytopenia, unspecified: Secondary | ICD-10-CM | POA: Diagnosis present

## 2023-04-18 DIAGNOSIS — R0602 Shortness of breath: Principal | ICD-10-CM

## 2023-04-18 DIAGNOSIS — N1832 Chronic kidney disease, stage 3b: Secondary | ICD-10-CM | POA: Diagnosis not present

## 2023-04-18 DIAGNOSIS — R0600 Dyspnea, unspecified: Secondary | ICD-10-CM | POA: Diagnosis not present

## 2023-04-18 DIAGNOSIS — I10 Essential (primary) hypertension: Secondary | ICD-10-CM | POA: Diagnosis present

## 2023-04-18 DIAGNOSIS — I5033 Acute on chronic diastolic (congestive) heart failure: Secondary | ICD-10-CM | POA: Diagnosis not present

## 2023-04-18 DIAGNOSIS — E1122 Type 2 diabetes mellitus with diabetic chronic kidney disease: Secondary | ICD-10-CM | POA: Diagnosis not present

## 2023-04-18 DIAGNOSIS — I509 Heart failure, unspecified: Secondary | ICD-10-CM

## 2023-04-18 DIAGNOSIS — I130 Hypertensive heart and chronic kidney disease with heart failure and stage 1 through stage 4 chronic kidney disease, or unspecified chronic kidney disease: Secondary | ICD-10-CM | POA: Diagnosis not present

## 2023-04-18 DIAGNOSIS — Z808 Family history of malignant neoplasm of other organs or systems: Secondary | ICD-10-CM

## 2023-04-18 DIAGNOSIS — R918 Other nonspecific abnormal finding of lung field: Secondary | ICD-10-CM | POA: Diagnosis not present

## 2023-04-18 DIAGNOSIS — I517 Cardiomegaly: Secondary | ICD-10-CM | POA: Diagnosis not present

## 2023-04-18 DIAGNOSIS — D469 Myelodysplastic syndrome, unspecified: Secondary | ICD-10-CM | POA: Diagnosis present

## 2023-04-18 DIAGNOSIS — Z8249 Family history of ischemic heart disease and other diseases of the circulatory system: Secondary | ICD-10-CM

## 2023-04-18 DIAGNOSIS — S0990XA Unspecified injury of head, initial encounter: Secondary | ICD-10-CM | POA: Diagnosis not present

## 2023-04-18 DIAGNOSIS — D649 Anemia, unspecified: Secondary | ICD-10-CM | POA: Diagnosis present

## 2023-04-18 HISTORY — DX: Acute on chronic diastolic (congestive) heart failure: I50.33

## 2023-04-18 LAB — RESP PANEL BY RT-PCR (RSV, FLU A&B, COVID)  RVPGX2
Influenza A by PCR: NEGATIVE
Influenza B by PCR: NEGATIVE
Resp Syncytial Virus by PCR: NEGATIVE
SARS Coronavirus 2 by RT PCR: NEGATIVE

## 2023-04-18 LAB — CBC WITH DIFFERENTIAL/PLATELET
Abs Immature Granulocytes: 0.02 10*3/uL (ref 0.00–0.07)
Basophils Absolute: 0 10*3/uL (ref 0.0–0.1)
Basophils Relative: 0 %
Eosinophils Absolute: 0.1 10*3/uL (ref 0.0–0.5)
Eosinophils Relative: 3 %
HCT: 26.3 % — ABNORMAL LOW (ref 39.0–52.0)
Hemoglobin: 8.7 g/dL — ABNORMAL LOW (ref 13.0–17.0)
Immature Granulocytes: 0 %
Lymphocytes Relative: 23 %
Lymphs Abs: 1.3 10*3/uL (ref 0.7–4.0)
MCH: 32.2 pg (ref 26.0–34.0)
MCHC: 33.1 g/dL (ref 30.0–36.0)
MCV: 97.4 fL (ref 80.0–100.0)
Monocytes Absolute: 0.5 10*3/uL (ref 0.1–1.0)
Monocytes Relative: 9 %
Neutro Abs: 3.5 10*3/uL (ref 1.7–7.7)
Neutrophils Relative %: 65 %
Platelets: 101 10*3/uL — ABNORMAL LOW (ref 150–400)
RBC: 2.7 MIL/uL — ABNORMAL LOW (ref 4.22–5.81)
RDW: 16 % — ABNORMAL HIGH (ref 11.5–15.5)
WBC: 5.4 10*3/uL (ref 4.0–10.5)
nRBC: 0 % (ref 0.0–0.2)

## 2023-04-18 LAB — RETICULOCYTES
Immature Retic Fract: 6.1 % (ref 2.3–15.9)
RBC.: 2.75 MIL/uL — ABNORMAL LOW (ref 4.22–5.81)
Retic Count, Absolute: 34.7 10*3/uL (ref 19.0–186.0)
Retic Ct Pct: 1.3 % (ref 0.4–3.1)

## 2023-04-18 LAB — IRON AND TIBC
Iron: 44 ug/dL — ABNORMAL LOW (ref 45–182)
Saturation Ratios: 14 % — ABNORMAL LOW (ref 17.9–39.5)
TIBC: 315 ug/dL (ref 250–450)
UIBC: 271 ug/dL

## 2023-04-18 LAB — COMPREHENSIVE METABOLIC PANEL
ALT: 28 U/L (ref 0–44)
AST: 34 U/L (ref 15–41)
Albumin: 3.5 g/dL (ref 3.5–5.0)
Alkaline Phosphatase: 98 U/L (ref 38–126)
Anion gap: 10 (ref 5–15)
BUN: 59 mg/dL — ABNORMAL HIGH (ref 8–23)
CO2: 21 mmol/L — ABNORMAL LOW (ref 22–32)
Calcium: 8.5 mg/dL — ABNORMAL LOW (ref 8.9–10.3)
Chloride: 104 mmol/L (ref 98–111)
Creatinine, Ser: 2.57 mg/dL — ABNORMAL HIGH (ref 0.61–1.24)
GFR, Estimated: 23 mL/min — ABNORMAL LOW (ref >60)
Glucose, Bld: 139 mg/dL — ABNORMAL HIGH (ref 70–99)
Potassium: 4.1 mmol/L (ref 3.5–5.1)
Sodium: 135 mmol/L (ref 135–145)
Total Bilirubin: 0.7 mg/dL (ref <1.2)
Total Protein: 7.5 g/dL (ref 6.5–8.1)

## 2023-04-18 LAB — BRAIN NATRIURETIC PEPTIDE: B Natriuretic Peptide: 921.6 pg/mL — ABNORMAL HIGH (ref 0.0–100.0)

## 2023-04-18 LAB — TROPONIN I (HIGH SENSITIVITY)
Troponin I (High Sensitivity): 27 ng/L — ABNORMAL HIGH (ref <18)
Troponin I (High Sensitivity): 26 ng/L — ABNORMAL HIGH (ref <18)

## 2023-04-18 LAB — FOLATE: Folate: >40 ng/mL (ref >5.9)

## 2023-04-18 LAB — FERRITIN: Ferritin: 260 ng/mL (ref 24–336)

## 2023-04-18 LAB — VITAMIN B12: Vitamin B-12: 525 pg/mL (ref 180–914)

## 2023-04-18 MED ORDER — LATANOPROST 0.005 % OP SOLN
1.0000 [drp] | Freq: Every day | OPHTHALMIC | Status: DC
Start: 2023-04-18 — End: 2023-04-21
  Administered 2023-04-18 – 2023-04-20 (×3): 1 [drp] via OPHTHALMIC
  Filled 2023-04-18: qty 2.5

## 2023-04-18 MED ORDER — ONDANSETRON HCL 4 MG/2ML IJ SOLN: 4.0000 mg | Freq: Four times a day (QID) | INTRAMUSCULAR | Status: DC | PRN

## 2023-04-18 MED ORDER — ORAL CARE MOUTH RINSE: 15.0000 mL | OROMUCOSAL | Status: DC | PRN

## 2023-04-18 MED ORDER — APIXABAN 2.5 MG PO TABS
2.5000 mg | ORAL_TABLET | Freq: Two times a day (BID) | ORAL | Status: DC
  Administered 2023-04-18 – 2023-04-21 (×6): 2.5 mg via ORAL
  Filled 2023-04-18 (×6): qty 1

## 2023-04-18 MED ORDER — TAMSULOSIN HCL 0.4 MG PO CAPS
0.8000 mg | ORAL_CAPSULE | Freq: Every day | ORAL | Status: DC
  Administered 2023-04-19 – 2023-04-20 (×2): 0.8 mg via ORAL
  Filled 2023-04-18 (×2): qty 2

## 2023-04-18 MED ORDER — SODIUM CHLORIDE 0.9% FLUSH
3.0000 mL | Freq: Two times a day (BID) | INTRAVENOUS | Status: DC
  Administered 2023-04-18: 3 mL via INTRAVENOUS

## 2023-04-18 MED ORDER — SODIUM CHLORIDE 0.9 % IV SOLN: 250.0000 mL | INTRAVENOUS | Status: DC | PRN

## 2023-04-18 MED ORDER — FUROSEMIDE 10 MG/ML IJ SOLN: 40.0000 mg | Freq: Every day | INTRAMUSCULAR | Status: DC

## 2023-04-18 MED ORDER — VITAMIN D3 25 MCG (1000 UNIT) PO TABS
5000.0000 [IU] | ORAL_TABLET | Freq: Every day | ORAL | Status: DC
Start: 2023-04-19 — End: 2023-04-21
  Administered 2023-04-19 – 2023-04-21 (×3): 5000 [IU] via ORAL
  Filled 2023-04-18 (×4): qty 5

## 2023-04-18 MED ORDER — FUROSEMIDE 10 MG/ML IJ SOLN: 40.0000 mg | Freq: Two times a day (BID) | INTRAMUSCULAR | Status: DC

## 2023-04-18 MED ORDER — FUROSEMIDE 10 MG/ML IJ SOLN
40.0000 mg | Freq: Once | INTRAMUSCULAR | Status: AC
  Administered 2023-04-18: 40 mg via INTRAVENOUS
  Filled 2023-04-18: qty 4

## 2023-04-18 MED ORDER — AMLODIPINE BESYLATE 5 MG PO TABS
5.0000 mg | ORAL_TABLET | Freq: Every day | ORAL | Status: DC
  Administered 2023-04-18 – 2023-04-20 (×3): 5 mg via ORAL
  Filled 2023-04-18 (×3): qty 1

## 2023-04-18 MED ORDER — FINASTERIDE 5 MG PO TABS
5.0000 mg | ORAL_TABLET | Freq: Every day | ORAL | Status: DC
  Administered 2023-04-19 – 2023-04-21 (×3): 5 mg via ORAL
  Filled 2023-04-18 (×3): qty 1

## 2023-04-18 MED ORDER — ATORVASTATIN CALCIUM 10 MG PO TABS
20.0000 mg | ORAL_TABLET | Freq: Every day | ORAL | Status: DC
  Administered 2023-04-18 – 2023-04-20 (×3): 20 mg via ORAL
  Filled 2023-04-18 (×3): qty 2

## 2023-04-18 MED ORDER — ACETAMINOPHEN 325 MG PO TABS: 650.0000 mg | ORAL_TABLET | ORAL | Status: DC | PRN

## 2023-04-18 MED ORDER — ALLOPURINOL 100 MG PO TABS
100.0000 mg | ORAL_TABLET | Freq: Every day | ORAL | Status: DC
  Administered 2023-04-19 – 2023-04-21 (×3): 100 mg via ORAL
  Filled 2023-04-18 (×3): qty 1

## 2023-04-18 MED ORDER — OMEGA-3-ACID ETHYL ESTERS 1 G PO CAPS
1.0000 g | ORAL_CAPSULE | Freq: Every day | ORAL | Status: DC
  Administered 2023-04-19 – 2023-04-21 (×3): 1 g via ORAL
  Filled 2023-04-18 (×3): qty 1

## 2023-04-18 MED ORDER — DICYCLOMINE HCL 10 MG PO CAPS: 10.0000 mg | ORAL_CAPSULE | Freq: Four times a day (QID) | ORAL | Status: DC | PRN

## 2023-04-18 MED ORDER — SODIUM CHLORIDE 0.9% FLUSH: 3.0000 mL | INTRAVENOUS | Status: DC | PRN

## 2023-04-18 MED ORDER — CARVEDILOL 6.25 MG PO TABS
6.2500 mg | ORAL_TABLET | Freq: Two times a day (BID) | ORAL | Status: DC
  Administered 2023-04-19 – 2023-04-21 (×5): 6.25 mg via ORAL
  Filled 2023-04-18 (×5): qty 1

## 2023-04-18 MED ORDER — POTASSIUM CHLORIDE CRYS ER 10 MEQ PO TBCR
20.0000 meq | EXTENDED_RELEASE_TABLET | Freq: Two times a day (BID) | ORAL | Status: DC
  Administered 2023-04-19 – 2023-04-21 (×5): 20 meq via ORAL
  Filled 2023-04-18 (×6): qty 2

## 2023-04-18 MED ORDER — ALBUTEROL SULFATE HFA 108 (90 BASE) MCG/ACT IN AERS: 2.0000 | INHALATION_SPRAY | RESPIRATORY_TRACT | Status: DC | PRN

## 2023-04-18 MED ORDER — TIMOLOL MALEATE 0.5 % OP SOLN
1.0000 [drp] | Freq: Two times a day (BID) | OPHTHALMIC | Status: DC
  Administered 2023-04-18 – 2023-04-21 (×6): 1 [drp] via OPHTHALMIC
  Filled 2023-04-18: qty 5

## 2023-04-18 NOTE — ED Triage Notes (Signed)
Per family pt has gained weight this week and more sob has hx of CHF, kidney issues , abd has been distended

## 2023-04-18 NOTE — ED Notes (Signed)
Patient noted to have [employer] brand pacemaker (information collected from chart). No [employer] interrogator present at this facility. RN requested pacemaker information card from patient. Patient reports it is at home. Family called other family in attempt to obtain card information.

## 2023-04-18 NOTE — Plan of Care (Signed)
  Problem: Education: Goal: Knowledge of General Education information will improve Description Including pain rating scale, medication(s)/side effects and non-pharmacologic comfort measures Outcome: Progressing   Problem: Health Behavior/Discharge Planning: Goal: Ability to manage health-related needs will improve Outcome: Progressing   

## 2023-04-18 NOTE — H&P (Signed)
History and Physical    Patient: [name] ZOX:[id number] DOB: [birth year] DOA: 04/18/2023 DOS: the patient was seen and examined on 04/18/2023 PCP: Natalia Leatherwood, DO  Patient coming from: Home  Chief Complaint:  Chief Complaint  Patient presents with   Shortness of Breath   HPI: [name] is a 89 y.o. male with medical history significant of CAD s/p CABG, PAF on Eliquis, CHB s/p PPM, chronic HFpEF (EF 50-55%), severe TR, CKD stage IIIb, anemia of CKD and iron deficiency, HTN, HLD, bladder cancer/BPH who presented to the ED for evaluation of shortness of breath and weight gain.   Patient has increased home Lasix recently due to progressive weight gain and SOB without significant improvement.  Dry weight is around 142 pounds and his weight at home was 147 pounds today per EDP.   Labs notable for hemoglobin 8.7 (baseline ~10-11), creatinine 2.57 (baseline around 2.0-2.3), BNP 921.6, troponin 27 > 26.   CXR showed small-moderate left pleural effusion, chronic mild blunting of right costophrenic angle, patchy and streaky opacities throughout the perihilar and lower left lung.  Stable mild cardiomegaly.   CT head without contrast negative for acute findings.   Patient was given IV Lasix 40 mg.  Admission requested for further management of CHF.   Review of Systems: As mentioned in the history of present illness. All other systems reviewed and are negative. Past Medical History:  Diagnosis Date   A-fib [facility])    Basal cell carcinoma    skin   Bigeminal rhythm    Bradycardia 06/21/2017   Cancer of anterior wall of urinary bladder (HCC) 07/18/2019   Cataract    Chronic renal insufficiency, stage 3 (moderate) (HCC) 2018   GFR 30s-40s   Coronary artery disease    post bypass   CVD (cardiovascular disease)    Diabetes mellitus without complication (HCC)    type 2 diet controlled   Diverticular disease    Easy bruising    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   Gout     Hematuria 06/30/2019   Hernia    Hyperkalemia 05/06/2017   Hyperlipidemia    Hypertension    IBS (irritable bowel syndrome)    Iron deficiency anemia 02/04/2016   Macular degeneration    Microscopic colitis    PVD (peripheral vascular disease) (HCC)    Past Surgical History:  Procedure Laterality Date   CARDIAC CATHETERIZATION  2006   CARDIOVERSION N/A 02/22/2013   Procedure: CARDIOVERSION;  Surgeon: Wendall Stade, MD;  Location[facility] ENDOSCOPY;  Service: Cardiovascular;  Laterality: N/A;   CARDIOVERSION N/A 11/15/2014   Procedure: CARDIOVERSION;  Surgeon: Wendall Stade, MD;  Location: [facility] ENDOSCOPY;  Service: Cardiovascular;  Laterality: N/A;   CARDIOVERSION N/A 04/01/2017   Procedure: CARDIOVERSION;  Surgeon: Laurey Morale, MD;  Location: [facility] ENDOSCOPY;  Service: Cardiovascular;  Laterality: N/A;   CAROTID ENDARTERECTOMY  2009/ 1993   left/ right   COLONOSCOPY  03/17/2005   The colon is normal.   CORONARY ARTERY BYPASS GRAFT  1999   CYSTOSCOPY W/ RETROGRADES Bilateral 07/14/2019   Procedure: CYSTOSCOPY WITH RETROGRADE PYELOGRAM;  Surgeon: Marcine Matar, MD;  Location: [facility];  Service: Urology;  Laterality: Bilateral;   ESOPHAGOGASTRODUODENOSCOPY  03/17/2005   Normal esophagus. Normal Stomanch. Normal duodenum.    EYE SURGERY     eyelid repair   INGUINAL HERNIA REPAIR  02/11/2012   Procedure: LAPAROSCOPIC BILATERAL INGUINAL HERNIA REPAIR;  Surgeon: Valarie Merino, MD;  Location: [facility] ORS;  Service: General;  Laterality: Bilateral;   PACEMAKER IMPLANT N/A 07/28/2017   [employer] Assurity MRI conditional  dual-chamber pacemaker for symptomatic second degree AV block by Dr Johney Frame   SKIN CANCER EXCISION     right ear x 3   TRANSURETHRAL RESECTION OF BLADDER TUMOR N/A 09/05/2019   Procedure: TRANSURETHRAL RESECTION OF BLADDER TUMOR (TURBT);  Surgeon: Marcine Matar, MD;  Location: [facility];  Service: Urology;  Laterality: N/A;    TRANSURETHRAL RESECTION OF BLADDER TUMOR WITH MITOMYCIN-C N/A 07/14/2019   Procedure: TRANSURETHRAL RESECTION OF BLADDER TUMOR WITH GEMCITABINE IN PACU;  Surgeon: Marcine Matar, MD;  Location: [facility];  Service: Urology;  Laterality: N/A;   Social History:  reports that he has quit smoking. He quit smokeless tobacco use about 54 years ago.  His smokeless tobacco use included chew. He reports that he does not drink alcohol and does not use drugs.  Allergies  Allergen Reactions   Lisinopril Other (See Comments)     he has difficulty controlling potassium on ace/arb- lisinopril was dc'd in the past 2/2 to hyperkalemia documented by [facility] PCP 01/2021   Penicillins Hives    Has patient had a PCN reaction causing immediate rash, facial/tongue/throat swelling, SOB or lightheadedness with hypotension: No Has patient had a PCN reaction causing severe rash involving mucus membranes or skin necrosis: Yes Has patient had a PCN reaction that required hospitalization: No Has patient had a PCN reaction occurring within the last 10 years: No If all of the above answers are "NO", then may proceed with Cephalosporin use.    Vancomycin Other (See Comments)    Edema and myalgia    Family History  Problem Relation Age of Onset   Stroke Mother    Coronary artery disease Father    Heart disease Father    Melanoma Sister    Colon cancer Neg Hx     Prior to Admission medications   Medication Sig Start Date End Date Taking? Authorizing Provider  allopurinol (ZYLOPRIM) 100 MG tablet Take 1 tablet (100 mg total) by mouth daily. 01/08/23   Kuneff, Renee A, DO  amLODipine (NORVASC) 5 MG tablet Take 1 tablet (5 mg total) by mouth at bedtime. 01/06/23   Kuneff, Renee A, DO  atorvastatin (LIPITOR) 20 MG tablet Take 1 tablet (20 mg total) by mouth at bedtime. 07/22/22   Kuneff, Renee A, DO  carvedilol (COREG) 6.25 MG tablet Take 1 tablet (6.25 mg total) by mouth 2 (two) times daily with a  meal. 01/06/23   Kuneff, Renee A, DO  Cholecalciferol 125 MCG (5000 UT) capsule Take 5,000 Units by mouth daily.    [provider]  dicyclomine (BENTYL) 10 MG capsule Take 1 capsule (10 mg total) by mouth every 6 (six) hours as needed. 10/23/22   Kuneff, Renee A, DO  ELIQUIS 2.5 MG TABS tablet TAKE 1 TABLET BY MOUTH TWICE A DAY 12/18/22   Mealor, Roberts Gaudy, MD  finasteride (PROSCAR) 5 MG tablet Take 5 mg by mouth daily. 08/24/17   [provider]  fish oil-omega-3 fatty acids 1000 MG capsule Take 1 g by mouth daily.     [provider]  fluticasone (FLONASE) 50 MCG/ACT nasal spray Place 2 sprays into both nostrils daily. 02/13/22   [provider]  furosemide (LASIX) 40 MG tablet Take 1 tablet (40 mg total) by mouth 2 (two) times daily. 01/08/23   Kuneff, Renee A, DO  glucose blood (ONETOUCH VERIO) test strip  Use to check sugar daily as instructed. 09/25/22   Kuneff, Renee A, DO  Lancets (ONETOUCH ULTRASOFT) lancets Use to check sugar daily as instructed. 09/25/22   Kuneff, Renee A, DO  latanoprost (XALATAN) 0.005 % ophthalmic solution Place 1 drop into both eyes at bedtime. 07/14/18   [provider]  Multiple Vitamins-Minerals (PRESERVISION AREDS PO) Take 1 tablet by mouth 2 (two) times daily.     [provider]  potassium chloride 20 MEQ TBCR Take 1 tablet (20 mEq total) by mouth 2 (two) times daily. 01/08/23   Kuneff, Renee A, DO  Probiotic Product (PROBIOTIC PO) Take 1 tablet by mouth daily.    [provider]  tamsulosin (FLOMAX) 0.4 MG CAPS capsule Take 0.4 mg by mouth in the morning and at bedtime.    [provider]  timolol (TIMOPTIC) 0.5 % ophthalmic solution Place 1 drop into both eyes 2 (two) times daily.  10/06/18   [provider]  UNABLE TO FIND Take 2 tablets by mouth every morning. Med Name: [name] (takes place of Co Q 10)    [provider]    Physical Exam: Vitals:   04/18/23 1915 04/18/23 1930  04/18/23 2000 04/18/23 2100  BP:  (!) 149/91 (!) 146/63   Pulse: 62 62 61   Resp: (!) 34 18 17   Temp:   98 F (36.7 C)   TempSrc:   Oral   SpO2: 100% 95% 95%   Weight:    67.9 kg  Height:    5\' 7"  (1.702 m)   Constitutional: NAD, calm, comfortable Respiratory: clear to auscultation bilaterally, no wheezing, no crackles. Normal respiratory effort. No accessory muscle use.  Cardiovascular: Anasarca, with 3-4+ BLE pitting edema R> L Abdomen: no tenderness, no masses palpated. No hepatosplenomegaly. Bowel sounds positive.  Neurologic: CN 2-12 grossly intact. Sensation intact, DTR normal. Strength 5/5 in all 4.  Psychiatric: Normal judgment and insight. Alert and oriented x 3. Normal mood.   Data Reviewed:    Labs on Admission: I have personally reviewed following labs and imaging studies  CBC: Recent Labs  Lab 04/18/23 1511  WBC 5.4  NEUTROABS 3.5  HGB 8.7*  HCT 26.3*  MCV 97.4  PLT 101*   Basic Metabolic Panel: Recent Labs  Lab 04/18/23 1511  NA 135  K 4.1  CL 104  CO2 21*  GLUCOSE 139*  BUN 59*  CREATININE 2.57*  CALCIUM 8.5*   GFR: Estimated Creatinine Clearance: 17.1 mL/min (A) (by C-G formula based on SCr of 2.57 mg/dL (H)). Liver Function Tests: Recent Labs  Lab 04/18/23 1511  AST 34  ALT 28  ALKPHOS 98  BILITOT 0.7  PROT 7.5  ALBUMIN 3.5   No results for input(s): "LIPASE", "AMYLASE" in the last 168 hours. No results for input(s): "AMMONIA" in the last 168 hours. Coagulation Profile: No results for input(s): "INR", "PROTIME" in the last 168 hours. Cardiac Enzymes: No results for input(s): "CKTOTAL", "CKMB", "CKMBINDEX", "TROPONINI" in the last 168 hours. BNP (last 3 results) Recent Labs    08/01/22 1347 09/29/22 1305 01/06/23 1341  PROBNP 6,451* 3,678* 1,570.0*   HbA1C: No results for input(s): "HGBA1C" in the last 72 hours. CBG: No results for input(s): "GLUCAP" in the last 168 hours. Lipid Profile: No results for input(s): "CHOL",  "HDL", "LDLCALC", "TRIG", "CHOLHDL", "LDLDIRECT" in the last 72 hours. Thyroid Function Tests: No results for input(s): "TSH", "T4TOTAL", "FREET4", "T3FREE", "THYROIDAB" in the last 72 hours. Anemia Panel: No results for  input(s): "VITAMINB12", "FOLATE", "FERRITIN", "TIBC", "IRON", "RETICCTPCT" in the last 72 hours. Urine analysis:    Component Value Date/Time   COLORURINE ORANGE (A) 08/16/2017 0936   APPEARANCEUR HAZY (A) 08/16/2017 0936   LABSPEC 1.010 08/16/2017 0936   PHURINE 5.5 08/16/2017 0936   GLUCOSEU NEGATIVE 08/16/2017 0936   HGBUR TRACE (A) 08/16/2017 0936   BILIRUBINUR NEGATIVE 08/16/2017 0936   KETONESUR NEGATIVE 08/16/2017 0936   PROTEINUR 100 (A) 08/16/2017 0936   UROBILINOGEN 0.2 10/21/2008 2146   NITRITE POSITIVE (A) 08/16/2017 0936   LEUKOCYTESUR SMALL (A) 08/16/2017 0936    Radiological Exams on Admission: CT Head Wo Contrast Result Date: 04/18/2023 CLINICAL DATA:  Minor head trauma. EXAM: CT HEAD WITHOUT CONTRAST TECHNIQUE: Contiguous axial images were obtained from the base of the skull through the vertex without intravenous contrast. RADIATION DOSE REDUCTION: This exam was performed according to the departmental dose-optimization program which includes automated exposure control, adjustment of the mA and/or kV according to patient size and/or use of iterative reconstruction technique. COMPARISON:  02/15/2023 FINDINGS: Brain: Ventricles, cisterns and other CSF spaces are within normal. There is mild chronic ischemic microvascular disease present. Old focal infarct over the left corona radiata. No mass, mass effect, shift of midline structures or acute hemorrhage. No acute infarct. Vascular: No hyperdense vessel or unexpected calcification. Skull: Normal. Negative for fracture or focal lesion. Sinuses/Orbits: Orbits are normal. Minimal chronic inflammatory change of the sinuses. Other: None. IMPRESSION: 1. No acute findings. 2. Mild chronic ischemic microvascular  disease. Small old focal infarct over the left corona radiata. Electronically Signed   By: [name] M.D.   On: 04/18/2023 17:56   DG Chest 2 View Result Date: 04/18/2023 CLINICAL DATA:  Dyspnea EXAM: CHEST - 2 VIEW COMPARISON:  12/29/2022 chest radiograph. FINDINGS: Intact sternotomy wires. Left mediastinal CABG clips. Surgical clip in the right supraclavicular region. Stable 2 lead left subclavian pacemaker with lead tips overlying the right atrium and right ventricle. Stable cardiomediastinal silhouette with mild cardiomegaly. No pneumothorax. Chronic mild blunting of the right costophrenic angle. Small to moderate left pleural effusion, slightly increased. Patchy and streaky opacity throughout the parahilar and lower left lung, similar. Mild streaky right lung base scarring versus atelectasis. IMPRESSION: 1. Small to moderate left pleural effusion, slightly increased. 2. Chronic mild blunting of the right costophrenic angle, cannot exclude a trace right pleural effusion. 3. Patchy and streaky opacity throughout the parahilar and lower left lung, equivocal for asymmetric pulmonary edema, pneumonia, scarring and/or atelectasis. Chest radiograph follow-up advised. 4. Stable mild cardiomegaly. Electronically Signed   By: Delbert Phenix M.D.   On: 04/18/2023 15:43    EKG: Independently reviewed.   Assessment and Plan: * Acute on chronic heart failure with preserved ejection fraction (HFpEF) (HCC) Wt gain, SOB, BNP elevation, mild-mod pleural effusion, and CXR findings suspicious for asymmetric edema in setting of severe TR and Mild-mod MR. DDx includes PNA, though neg COVID, FLU, RSV, nl WBC. CHF pathway Lasix 40mg  IV x1 in ED, will do 40mg  IV BID for the moment Strict intake and output Daily BMP No ACEi due to renal fxn Will go ahead and get updated 2d echo.  Anemia Multifactorial due to 1) iron def and 2) anemia of CKD, slightly worse than baseline. Check anemia panel. Repeat CBC in  AM Sounds like he is overdue for erythropoetin injection, last was ~2 months ago, usually sees Dr. Myna Hidalgo once a month according to family. Sent message to Dr. Myna Hidalgo via secure chat, may want to  give them a call in AM.  CKD (chronic kidney disease) stage 4, GFR 15-29 ml/min (HCC) Creat 2.57 today, near baseline. Daily BMP Strict intake and output Close monitoring with diuresis for CHF. Looks like he sees nephrology (Saw Dr. Marisue Humble in March 2024 based on billing data).  Hyperlipidemia LDL goal <70 Cont home statin  Bence Jones proteinuria Listed on chart. Cause of his CKD? Though myeloma not mentioned by Dr. Myna Hidalgo on his most recent (May 2024) office note...  A-fib (HCC) Cont Eliquis for now Monitor daily CBC as he's actually mildly thrombocytopenic at 101. Cont coreg  Essential hypertension Home med rec pending. Plan = continue home BP meds      Advance Care Planning:   Code Status: Limited: Do not attempt resuscitation (DNR) -DNR-LIMITED -Do Not Intubate/DNI   Most form on file  Consults: None  Family Communication: Family at bedside  Severity of Illness: The appropriate patient status for this patient is OBSERVATION. Observation status is judged to be reasonable and necessary in order to provide the required intensity of service to ensure the patient's safety. The patient's presenting symptoms, physical exam findings, and initial radiographic and laboratory data in the context of their medical condition is felt to place them at decreased risk for further clinical deterioration. Furthermore, it is anticipated that the patient will be medically stable for discharge from the hospital within 2 midnights of admission.   Author: Hillary Bow., DO 04/18/2023 9:04 PM  For on call review [email or web address].

## 2023-04-18 NOTE — Assessment & Plan Note (Addendum)
Multifactorial due to 1) iron def and 2) anemia of CKD, slightly worse than baseline. Check anemia panel. Repeat CBC in AM Sounds like he is overdue for erythropoetin injection, last was ~2 months ago, usually sees Dr. Myna Hidalgo once a month according to family. Sent message to Dr. Myna Hidalgo via secure chat, may want to give them a call in AM.

## 2023-04-18 NOTE — ED Notes (Signed)
Patient family updated.

## 2023-04-18 NOTE — ED Provider Notes (Signed)
[place] [facility] Provider Note   CSN: [id number] Arrival date & time: 04/18/23  1449     History {Add pertinent medical, surgical, social history, OB history to HPI:1} Chief Complaint  Patient presents with   Shortness of Breath    [name] is a 89 y.o. male.  HPI      89yo male with history of CAD status post CABG in 1999, CHB s/p Saint Jude PPM 2019, PAF on eliquis 2.5mg  BID, anemia receiving iron infusions, CHF EF 40-45% 07/2022, hypertension, hyperlipidemia, PVD who presents with concern for shortness of breath, increased weight gain.    Feels like CHF he has had in the past, episodes over years, this time has been slowly increasing over the last month and worse this weekend.  Has doubled up on medications and still hasn't helped.  80mg  at night for past wee, before that was doing 40 in AM, 20 at night, then 40 at night, now 80 at night  149 last night and 147 this AM Dry weight around 140-142 Leg swelling more, right leg worse than left chronicall  Shortness of breath if bending over to tie shoes, do feel a little short of breath if lay down flat and if tries to do anything   No chest pain No cough or fever   A few nights there had black looking stool within the last 2 weeks but not today or yesterday  Reports he did have a fall but it might have been a month ago where he was getting out of the car and missed and fell back and hit his head.  After that, he did have some headache.  He reports that he saw his doctor who looked at the back of his head and saw small abrasion which is now healing.  He has not had any recent headaches.  On Wednesday, he was going to walk to the bathroom and then woke up on the floor.  He denies having any lightheadedness prior to this syncopal episode.  He has not had any headaches since that time.  Past Medical History:  Diagnosis Date   A-fib [facility])    Basal cell carcinoma    skin    Bigeminal rhythm    Bradycardia 06/21/2017   Cancer of anterior wall of urinary bladder (HCC) 07/18/2019   Cataract    Chronic renal insufficiency, stage 3 (moderate) (HCC) 2018   GFR 30s-40s   Coronary artery disease    post bypass   CVD (cardiovascular disease)    Diabetes mellitus without complication (HCC)    type 2 diet controlled   Diverticular disease    Easy bruising    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   Gout    Hematuria 06/30/2019   Hernia    Hyperkalemia 05/06/2017   Hyperlipidemia    Hypertension    IBS (irritable bowel syndrome)    Iron deficiency anemia 02/04/2016   Macular degeneration    Microscopic colitis    PVD (peripheral vascular disease) (HCC)     Home Medications Prior to Admission medications   Medication Sig Start Date End Date Taking? Authorizing Provider  allopurinol (ZYLOPRIM) 100 MG tablet Take 1 tablet (100 mg total) by mouth daily. 01/08/23   Kuneff, Renee A, DO  amLODipine (NORVASC) 5 MG tablet Take 1 tablet (5 mg total) by mouth at bedtime. 01/06/23   Kuneff, Renee A, DO  atorvastatin (LIPITOR) 20 MG tablet Take 1 tablet (20 mg total) by  mouth at bedtime. 07/22/22   Kuneff, Renee A, DO  carvedilol (COREG) 6.25 MG tablet Take 1 tablet (6.25 mg total) by mouth 2 (two) times daily with a meal. 01/06/23   Kuneff, Renee A, DO  Cholecalciferol 125 MCG (5000 UT) capsule Take 5,000 Units by mouth daily.    [provider]  dicyclomine (BENTYL) 10 MG capsule Take 1 capsule (10 mg total) by mouth every 6 (six) hours as needed. 10/23/22   Kuneff, Renee A, DO  ELIQUIS 2.5 MG TABS tablet TAKE 1 TABLET BY MOUTH TWICE A DAY 12/18/22   Mealor, Roberts Gaudy, MD  finasteride (PROSCAR) 5 MG tablet Take 5 mg by mouth daily. 08/24/17   [provider]  fish oil-omega-3 fatty acids 1000 MG capsule Take 1 g by mouth daily.     [provider]  fluticasone (FLONASE) 50 MCG/ACT nasal spray Place 2 sprays into both nostrils daily. 02/13/22   [provider]  furosemide (LASIX) 40 MG tablet Take 1 tablet (40 mg total) by mouth 2 (two) times daily. 01/08/23   Kuneff, Renee A, DO  glucose blood (ONETOUCH VERIO) test strip Use to check sugar daily as instructed. 09/25/22   Kuneff, Renee A, DO  Lancets (ONETOUCH ULTRASOFT) lancets Use to check sugar daily as instructed. 09/25/22   Kuneff, Renee A, DO  latanoprost (XALATAN) 0.005 % ophthalmic solution Place 1 drop into both eyes at bedtime. 07/14/18   [provider]  Multiple Vitamins-Minerals (PRESERVISION AREDS PO) Take 1 tablet by mouth 2 (two) times daily.     [provider]  potassium chloride 20 MEQ TBCR Take 1 tablet (20 mEq total) by mouth 2 (two) times daily. 01/08/23   Kuneff, Renee A, DO  Probiotic Product (PROBIOTIC PO) Take 1 tablet by mouth daily.    [provider]  tamsulosin (FLOMAX) 0.4 MG CAPS capsule Take 0.4 mg by mouth in the morning and at bedtime.    [provider]  timolol (TIMOPTIC) 0.5 % ophthalmic solution Place 1 drop into both eyes 2 (two) times daily.  10/06/18   [provider]  UNABLE TO FIND Take 2 tablets by mouth every morning. Med Name: [name] (takes place of Co Q 10)    [provider]      Allergies    Lisinopril, Penicillins, and Vancomycin    Review of Systems   Review of Systems  Physical Exam Updated Vital Signs Ht 5\' 7"  (1.702 m)   Wt 71 kg   SpO2 94%   BMI 24.52 kg/m  Physical Exam Vitals and nursing note reviewed.  Constitutional:      General: He is not in acute distress.    Appearance: He is well-developed. He is not diaphoretic.  HENT:     Head: Normocephalic and atraumatic.  Eyes:     Conjunctiva/sclera: Conjunctivae normal.  Neck:     Vascular: JVD present.  Cardiovascular:     Rate and Rhythm: Normal rate and regular rhythm.     Heart sounds: Murmur heard.     No friction rub. No gallop.  Pulmonary:     Effort: Pulmonary effort is normal. No respiratory distress.      Breath sounds: Examination of the right-lower field reveals decreased breath sounds. Examination of the left-lower field reveals decreased breath sounds. Decreased breath sounds present. No wheezing or rales.  Abdominal:     General: There is no distension.     Palpations: Abdomen is soft.  Tenderness: There is no abdominal tenderness. There is no guarding.  Musculoskeletal:     Cervical back: Normal range of motion.     Right lower leg: Edema (right greater than left 3+ edema) present.     Left lower leg: Edema present.  Skin:    General: Skin is warm and dry.  Neurological:     Mental Status: He is alert and oriented to person, place, and time.     ED Results / Procedures / Treatments   Labs (all labs ordered are listed, but only abnormal results are displayed) Labs Reviewed - No data to display  EKG None  Radiology No results found.  Procedures Procedures  {Document cardiac monitor, telemetry assessment procedure when appropriate:1}  Medications Ordered in ED Medications - No data to display  ED Course/ Medical Decision Making/ A&P   {   Click here for ABCD2, HEART and other calculatorsREFRESH Note before signing :1}                                89yo male with history of CAD status post CABG in 1999, CHB s/p Saint Jude PPM 2019, PAF on eliquis 2.5mg  BID, anemia receiving iron infusions, CHF EF 40-45% 07/2022, hypertension, hyperlipidemia, PVD who presents with concern for shortness of breath.     Differential diagnosis for dyspnea includes ACS, PE, COPD exacerbation, CHF exacerbation, anemia, pneumonia, viral etiology such as COVID 19 infection, metabolic abnormality.    Chest x-ray was done and personally evaluated by me which showed small to my left pleural effusion, slightly increased from prior, cannot exclude trace right pleural effusion patchy opacities throughout the perihilar and lower left lung which may be asymmetric pulmonary edema, pneumonia, scarring  or atelectasis.  Labs completed and personally about interpreted by me showed BNP of 921, which is less than what it was in September of 1768.  Creatinine is 2.57, slightly increased from September 27 when it was 2.35.  Hemoglobin is 8.7, which is decreased from September when it was 10.  No white blood cell count.  COVID, influenza and RSV testing are negative.. Troponin mildly elevated at 27 but less than prior values.     While his BNP is decreased from what it has been, clinically, it appears he has worsening congestive heart failure with increased weight, peripheral edema, JVD, and chest x-ray with findings more consistent with asymmetric edema clinically than pneumonia.  He has not had any fever, no leukocytosis, no cough.    Given his increasing symptoms of volume overload despite increase in Lasix, will admit to the hospital for diuresis and monitoring.  He also has anemia which may be contributing to his symptoms.  Current hemoglobin is 8.7, he denies any black or bloody stools over the last few days, but reports that he may have had an episode or 2 over the past 2 weeks.  He does have a history of anemia for which she receives iron infusions with Dr. Myna Hidalgo and reports that he has not had one in a while.   Do not suspect acute GI bleed at this time-although consider whether he may have had bleeding in the past 2 weeks, and also consider worsening of his anemia due to chronic kidney disease, erythropoietin deficiency and iron deficiency anemia for which she receives Aranesp and iron with Dr. Myna Hidalgo.  Given his history of syncope and fall on Eliquis, head CT was performed to evaluate for  signs of traumatic intracranial hemorrhage and shows***.  Ordered pacemaker interrogation given syncopal episode.  Will admit for continued care.  {Document critical care time when appropriate:1} {Document review of labs and clinical decision tools ie heart score, Chads2Vasc2 etc:1}  {Document your  independent review of radiology images, and any outside records:1} {Document your discussion with family members, caretakers, and with consultants:1} {Document social determinants of health affecting pt's care:1} {Document your decision making why or why not admission, treatments were needed:1} Final Clinical Impression(s) / ED Diagnoses Final diagnoses:  None    Rx / DC Orders ED Discharge Orders     None

## 2023-04-18 NOTE — ED Notes (Signed)
[facility] called for transport. 

## 2023-04-18 NOTE — Assessment & Plan Note (Addendum)
Creat 2.57 today, near baseline. Daily BMP Strict intake and output Close monitoring with diuresis for CHF. Looks like he sees nephrology (Saw Dr. Marisue Humble in March 2024 based on billing data).

## 2023-04-18 NOTE — ED Notes (Signed)
Patient transported to CT 

## 2023-04-18 NOTE — Progress Notes (Signed)
Plan of Care Note for accepted transfer   Patient: [name] MRN: [id number]   DOA: 04/18/2023  Facility requesting transfer: [facility] Requesting Provider: Dr. Dalene Seltzer Reason for transfer: CHF exacerbation Facility course:  89 year old with CAD s/p CABG, PAF on Eliquis, CHB s/p PPM, chronic HFpEF (EF 50-55%), severe TR, CKD stage IIIb, anemia of CKD and iron deficiency, HTN, HLD, bladder cancer/BPH who presented to the ED for evaluation of shortness of breath and weight gain.  Patient has increased home Lasix recently due to progressive weight gain and SOB without significant improvement.  Dry weight is around 142 pounds and his weight at home was 147 pounds today per EDP.  Labs notable for hemoglobin 8.7 (baseline ~10-11), creatinine 2.57 (baseline around 2.0-2.3), BNP 921.6, troponin 27 > 26.  CXR showed small-moderate left pleural effusion, chronic mild blunting of right costophrenic angle, patchy and streaky opacities throughout the perihilar and lower left lung.  Stable mild cardiomegaly.  CT head without contrast negative for acute findings.  Patient was given IV Lasix 40 mg.  Admission requested for further management of CHF.  Plan of care: The patient is accepted for admission to Telemetry unit, at [facility] or [facility], first available.  Author: Darreld Mclean, MD 04/18/2023  Check [email or web address] for on-call coverage.  Nursing staff, Please call [facility] Admits & Consults System-Wide number on Amion as soon as patient's arrival, so appropriate admitting provider can evaluate the pt.

## 2023-04-18 NOTE — Assessment & Plan Note (Addendum)
Cont Eliquis for now Monitor daily CBC as he's actually mildly thrombocytopenic at 101. Cont coreg

## 2023-04-18 NOTE — Assessment & Plan Note (Addendum)
Wt gain, SOB, BNP elevation, mild-mod pleural effusion, and CXR findings suspicious for asymmetric edema in setting of severe TR and Mild-mod MR. DDx includes PNA, though neg COVID, FLU, RSV, nl WBC. CHF pathway Lasix 40mg  IV x1 in ED, will do 40mg  IV BID for the moment Strict intake and output Daily BMP No ACEi due to renal fxn Will go ahead and get updated 2d echo.

## 2023-04-18 NOTE — Assessment & Plan Note (Signed)
Home med rec pending. Plan = continue home BP meds

## 2023-04-18 NOTE — Assessment & Plan Note (Addendum)
Listed on chart. Cause of his CKD? Though myeloma not mentioned by Dr. Myna Hidalgo on his most recent (May 2024) office note.Marland KitchenMarland Kitchen

## 2023-04-18 NOTE — ED Notes (Signed)
[facility] at bedside 

## 2023-04-18 NOTE — Assessment & Plan Note (Signed)
 Cont home statin

## 2023-04-18 NOTE — ED Notes (Signed)
Patients o2sat maintaining around 90% on RA. Patient placed on 2L [place] at this time.

## 2023-04-19 ENCOUNTER — Observation Stay (HOSPITAL_COMMUNITY): Payer: Medicare Other

## 2023-04-19 DIAGNOSIS — Z87891 Personal history of nicotine dependence: Secondary | ICD-10-CM | POA: Diagnosis not present

## 2023-04-19 DIAGNOSIS — D631 Anemia in chronic kidney disease: Secondary | ICD-10-CM | POA: Diagnosis present

## 2023-04-19 DIAGNOSIS — I361 Nonrheumatic tricuspid (valve) insufficiency: Secondary | ICD-10-CM

## 2023-04-19 DIAGNOSIS — Z881 Allergy status to other antibiotic agents status: Secondary | ICD-10-CM | POA: Diagnosis not present

## 2023-04-19 DIAGNOSIS — I110 Hypertensive heart disease with heart failure: Secondary | ICD-10-CM | POA: Diagnosis not present

## 2023-04-19 DIAGNOSIS — I130 Hypertensive heart and chronic kidney disease with heart failure and stage 1 through stage 4 chronic kidney disease, or unspecified chronic kidney disease: Secondary | ICD-10-CM | POA: Diagnosis present

## 2023-04-19 DIAGNOSIS — I5043 Acute on chronic combined systolic (congestive) and diastolic (congestive) heart failure: Secondary | ICD-10-CM | POA: Diagnosis present

## 2023-04-19 DIAGNOSIS — I509 Heart failure, unspecified: Secondary | ICD-10-CM

## 2023-04-19 DIAGNOSIS — Z7901 Long term (current) use of anticoagulants: Secondary | ICD-10-CM | POA: Diagnosis not present

## 2023-04-19 DIAGNOSIS — Z85828 Personal history of other malignant neoplasm of skin: Secondary | ICD-10-CM | POA: Diagnosis not present

## 2023-04-19 DIAGNOSIS — I480 Paroxysmal atrial fibrillation: Secondary | ICD-10-CM | POA: Diagnosis present

## 2023-04-19 DIAGNOSIS — E785 Hyperlipidemia, unspecified: Secondary | ICD-10-CM | POA: Diagnosis present

## 2023-04-19 DIAGNOSIS — Z1152 Encounter for screening for COVID-19: Secondary | ICD-10-CM | POA: Diagnosis not present

## 2023-04-19 DIAGNOSIS — Z888 Allergy status to other drugs, medicaments and biological substances status: Secondary | ICD-10-CM | POA: Diagnosis not present

## 2023-04-19 DIAGNOSIS — D696 Thrombocytopenia, unspecified: Secondary | ICD-10-CM | POA: Diagnosis present

## 2023-04-19 DIAGNOSIS — R0602 Shortness of breath: Secondary | ICD-10-CM | POA: Diagnosis present

## 2023-04-19 DIAGNOSIS — D469 Myelodysplastic syndrome, unspecified: Secondary | ICD-10-CM | POA: Diagnosis present

## 2023-04-19 DIAGNOSIS — Z66 Do not resuscitate: Secondary | ICD-10-CM | POA: Diagnosis present

## 2023-04-19 DIAGNOSIS — I2510 Atherosclerotic heart disease of native coronary artery without angina pectoris: Secondary | ICD-10-CM | POA: Diagnosis present

## 2023-04-19 DIAGNOSIS — Z79899 Other long term (current) drug therapy: Secondary | ICD-10-CM | POA: Diagnosis not present

## 2023-04-19 DIAGNOSIS — N184 Chronic kidney disease, stage 4 (severe): Secondary | ICD-10-CM | POA: Diagnosis present

## 2023-04-19 DIAGNOSIS — Z880 Allergy status to penicillin: Secondary | ICD-10-CM | POA: Diagnosis not present

## 2023-04-19 DIAGNOSIS — N1832 Chronic kidney disease, stage 3b: Secondary | ICD-10-CM | POA: Diagnosis not present

## 2023-04-19 DIAGNOSIS — D509 Iron deficiency anemia, unspecified: Secondary | ICD-10-CM | POA: Diagnosis present

## 2023-04-19 DIAGNOSIS — E1122 Type 2 diabetes mellitus with diabetic chronic kidney disease: Secondary | ICD-10-CM | POA: Diagnosis present

## 2023-04-19 DIAGNOSIS — Z8551 Personal history of malignant neoplasm of bladder: Secondary | ICD-10-CM | POA: Diagnosis not present

## 2023-04-19 DIAGNOSIS — E1151 Type 2 diabetes mellitus with diabetic peripheral angiopathy without gangrene: Secondary | ICD-10-CM | POA: Diagnosis present

## 2023-04-19 DIAGNOSIS — Z951 Presence of aortocoronary bypass graft: Secondary | ICD-10-CM | POA: Diagnosis not present

## 2023-04-19 DIAGNOSIS — I5033 Acute on chronic diastolic (congestive) heart failure: Secondary | ICD-10-CM | POA: Diagnosis not present

## 2023-04-19 DIAGNOSIS — N400 Enlarged prostate without lower urinary tract symptoms: Secondary | ICD-10-CM | POA: Diagnosis present

## 2023-04-19 LAB — ECHOCARDIOGRAM COMPLETE
Area-P 1/2: 9.03 cm2
Calc EF: 35.7 %
Height: 67 in
MV M vel: 4.35 m/s
MV Peak grad: 75.7 mm[Hg]
S' Lateral: 2.5 cm
Single Plane A2C EF: 34.9 %
Single Plane A4C EF: 33.5 %
Weight: 2396.81 [oz_av]

## 2023-04-19 LAB — CBC
HCT: 25 % — ABNORMAL LOW (ref 39.0–52.0)
Hemoglobin: 8 g/dL — ABNORMAL LOW (ref 13.0–17.0)
MCH: 31.3 pg (ref 26.0–34.0)
MCHC: 32 g/dL (ref 30.0–36.0)
MCV: 97.7 fL (ref 80.0–100.0)
Platelets: 97 10*3/uL — ABNORMAL LOW (ref 150–400)
RBC: 2.56 MIL/uL — ABNORMAL LOW (ref 4.22–5.81)
RDW: 15.8 % — ABNORMAL HIGH (ref 11.5–15.5)
WBC: 5.4 10*3/uL (ref 4.0–10.5)
nRBC: 0 % (ref 0.0–0.2)

## 2023-04-19 LAB — BASIC METABOLIC PANEL
Anion gap: 10 (ref 5–15)
BUN: 55 mg/dL — ABNORMAL HIGH (ref 8–23)
CO2: 22 mmol/L (ref 22–32)
Calcium: 8.6 mg/dL — ABNORMAL LOW (ref 8.9–10.3)
Chloride: 104 mmol/L (ref 98–111)
Creatinine, Ser: 2.55 mg/dL — ABNORMAL HIGH (ref 0.61–1.24)
GFR, Estimated: 23 mL/min — ABNORMAL LOW (ref >60)
Glucose, Bld: 149 mg/dL — ABNORMAL HIGH (ref 70–99)
Potassium: 3.9 mmol/L (ref 3.5–5.1)
Sodium: 136 mmol/L (ref 135–145)

## 2023-04-19 MED ORDER — FUROSEMIDE 10 MG/ML IJ SOLN
80.0000 mg | Freq: Two times a day (BID) | INTRAMUSCULAR | Status: DC
  Administered 2023-04-19 – 2023-04-21 (×5): 80 mg via INTRAVENOUS
  Filled 2023-04-19 (×6): qty 8

## 2023-04-19 MED ORDER — SODIUM CHLORIDE 0.9 % IV SOLN
125.0000 mg | Freq: Every day | INTRAVENOUS | Status: DC
  Administered 2023-04-19 – 2023-04-20 (×2): 125 mg via INTRAVENOUS
  Filled 2023-04-19 (×3): qty 10

## 2023-04-19 MED ORDER — PERFLUTREN LIPID MICROSPHERE
1.0000 mL | INTRAVENOUS | Status: AC | PRN
  Administered 2023-04-19: 3 mL via INTRAVENOUS

## 2023-04-19 MED ORDER — POTASSIUM CHLORIDE CRYS ER 20 MEQ PO TBCR
40.0000 meq | EXTENDED_RELEASE_TABLET | Freq: Once | ORAL | Status: AC
  Administered 2023-04-19: 40 meq via ORAL
  Filled 2023-04-19: qty 2

## 2023-04-19 NOTE — Care Management Obs Status (Signed)
MEDICARE OBSERVATION STATUS NOTIFICATION   Patient Details  Name: [name] MRN: [id number] Date of Birth: 1928/11/22   Medicare Observation Status Notification Given:  Yes    Ronny Bacon, RN 04/19/2023, 8:36 AM

## 2023-04-19 NOTE — Progress Notes (Signed)
PROGRESS NOTE    [name]  ZOX:[id number] DOB: [birth year] DOA: 04/18/2023 PCP: Natalia Leatherwood, DO   89/M w CAD s/p CABG, PAF on Eliquis, CHB s/p PPM, chronic HFpEF (EF 50-55%), severe TR, CKD stage IIIb, anemia of CKD and iron deficiency, HTN, HLD, bladder cancer/BPH who presented to the ED for evaluation of shortness of breath and weight gain. Recently increased home Lasix recently due to progressive weight gain and SOB without significant improvement.  -Labs noted drop in hemoglobin 8.7 (baseline ~10-11), creatinine 2.57 (baseline around 2.0-2.3), BNP 921.6, troponin 27 > 26. -CXR showed small-moderate left pleural effusion, chronic mild blunting of right costophrenic angle, patchy and streaky opacities throughout the perihilar and lower left lung  Subjective: -Feels a little better, breathing is improving  Assessment and Plan:  Acute on chronic diastolic CHF -He is clinically volume overloaded -Continue IV Lasix, increased dose to 80 Mg twice daily -Continue carvedilol -GDMT limited by CKD, monitor urine output, BMP in a.m. -Follow-up repeat echo -Increase activity, wean O2  Acute on chronic anemia -Multifactorial, iron deficiency, CKD and possibly MDS/myeloma -Anemia panel with iron deficiency, give IV iron  CKD (chronic kidney disease) stage 4,  -Relatively stable, monitor with increase diuretics  Hyperlipidemia LDL goal <70 Cont home statin  Bence Jones proteinuria Listed on chart. -Unclear if this is accurate, defer to Dr. Myna Hidalgo as outpatient.  A-fib (HCC) Cont Eliquis for now -Continue Coreg  DVT prophylaxis: Eliquis Code Status: DNR Family Communication: No family at bedside Disposition Plan: Home likely 48 hours  Consultants:    Procedures:   Antimicrobials:    Objective: Vitals:   04/18/23 2107 04/19/23 0011 04/19/23 0314 04/19/23 0754  BP: (!) 145/64 125/70 120/60 (!) 126/57  Pulse: 63 60 60   Resp: 18 17 14    Temp: (!) 97.4 F (36.3  C) 97.6 F (36.4 C) 97.6 F (36.4 C) 97.8 F (36.6 C)  TempSrc: Oral Oral Oral Oral  SpO2: 94% 92% 96%   Weight:   67.9 kg   Height:        Intake/Output Summary (Last 24 hours) at 04/19/2023 1052 Last data filed at 04/19/2023 0400 Gross per 24 hour  Intake 0 ml  Output 950 ml  Net -950 ml   Filed Weights   04/18/23 1500 04/18/23 2100 04/19/23 0314  Weight: 71 kg 67.9 kg 67.9 kg    Examination:  General exam: Appears calm and comfortable, AAOx3 HEENT: Positive JVD Respiratory system: Few Rales Cardiovascular system: S1 & S2 heard, RRR.  Abd: nondistended, soft and nontender.Normal bowel sounds heard. Central nervous system: Alert and oriented. No focal neurological deficits. Extremities: 1+ edema Skin: No rashes Psychiatry:  Mood & affect appropriate.     Data Reviewed:   CBC: Recent Labs  Lab 04/18/23 1511 04/19/23 0312  WBC 5.4 5.4  NEUTROABS 3.5  --   HGB 8.7* 8.0*  HCT 26.3* 25.0*  MCV 97.4 97.7  PLT 101* 97*   Basic Metabolic Panel: Recent Labs  Lab 04/18/23 1511 04/19/23 0312  NA 135 136  K 4.1 3.9  CL 104 104  CO2 21* 22  GLUCOSE 139* 149*  BUN 59* 55*  CREATININE 2.57* 2.55*  CALCIUM 8.5* 8.6*   GFR: Estimated Creatinine Clearance: 17.3 mL/min (A) (by C-G formula based on SCr of 2.55 mg/dL (H)). Liver Function Tests: Recent Labs  Lab 04/18/23 1511  AST 34  ALT 28  ALKPHOS 98  BILITOT 0.7  PROT 7.5  ALBUMIN 3.5  No results for input(s): "LIPASE", "AMYLASE" in the last 168 hours. No results for input(s): "AMMONIA" in the last 168 hours. Coagulation Profile: No results for input(s): "INR", "PROTIME" in the last 168 hours. Cardiac Enzymes: No results for input(s): "CKTOTAL", "CKMB", "CKMBINDEX", "TROPONINI" in the last 168 hours. BNP (last 3 results) Recent Labs    08/01/22 1347 09/29/22 1305 01/06/23 1341  PROBNP 6,451* 3,678* 1,570.0*   HbA1C: No results for input(s): "HGBA1C" in the last 72 hours. CBG: No results  for input(s): "GLUCAP" in the last 168 hours. Lipid Profile: No results for input(s): "CHOL", "HDL", "LDLCALC", "TRIG", "CHOLHDL", "LDLDIRECT" in the last 72 hours. Thyroid Function Tests: No results for input(s): "TSH", "T4TOTAL", "FREET4", "T3FREE", "THYROIDAB" in the last 72 hours. Anemia Panel: Recent Labs    04/18/23 2134  VITAMINB12 525  FOLATE >40.0  FERRITIN 260  TIBC 315  IRON 44*  RETICCTPCT 1.3   Urine analysis:    Component Value Date/Time   COLORURINE ORANGE (A) 08/16/2017 0936   APPEARANCEUR HAZY (A) 08/16/2017 0936   LABSPEC 1.010 08/16/2017 0936   PHURINE 5.5 08/16/2017 0936   GLUCOSEU NEGATIVE 08/16/2017 0936   HGBUR TRACE (A) 08/16/2017 0936   BILIRUBINUR NEGATIVE 08/16/2017 0936   KETONESUR NEGATIVE 08/16/2017 0936   PROTEINUR 100 (A) 08/16/2017 0936   UROBILINOGEN 0.2 10/21/2008 2146   NITRITE POSITIVE (A) 08/16/2017 0936   LEUKOCYTESUR SMALL (A) 08/16/2017 0936   Sepsis Labs: @LABRCNTIP (procalcitonin:4,lacticidven:4)  ) Recent Results (from the past 240 hours)  Resp panel by RT-PCR (RSV, Flu A&B, Covid) Anterior Nasal Swab     Status: None   Collection Time: 04/18/23  3:12 PM   Specimen: Anterior Nasal Swab  Result Value Ref Range Status   SARS Coronavirus 2 by RT PCR NEGATIVE NEGATIVE Final    Comment: (NOTE) SARS-CoV-2 target nucleic acids are NOT DETECTED.  The SARS-CoV-2 RNA is generally detectable in upper respiratory specimens during the acute phase of infection. The lowest concentration of SARS-CoV-2 viral copies this assay can detect is 138 copies/mL. A negative result does not preclude SARS-Cov-2 infection and should not be used as the sole basis for treatment or other patient management decisions. A negative result may occur with  improper specimen collection/handling, submission of specimen other than nasopharyngeal swab, presence of viral mutation(s) within the areas targeted by this assay, and inadequate number of  viral copies(<138 copies/mL). A negative result must be combined with clinical observations, patient history, and epidemiological information. The expected result is Negative.  Fact Sheet for Patients:  BloggerCourse.com  Fact Sheet for Healthcare Providers:  SeriousBroker.it  This test is no t yet approved or cleared by the [place] FDA and  has been authorized for detection and/or diagnosis of SARS-CoV-2 by FDA under an Emergency Use Authorization (EUA). This EUA will remain  in effect (meaning this test can be used) for the duration of the COVID-19 declaration under Section 564(b)(1) of the Act, 21 U.S.C.section 360bbb-3(b)(1), unless the authorization is terminated  or revoked sooner.       Influenza A by PCR NEGATIVE NEGATIVE Final   Influenza B by PCR NEGATIVE NEGATIVE Final    Comment: (NOTE) The Xpert Xpress SARS-CoV-2/FLU/RSV plus assay is intended as an aid in the diagnosis of influenza from Nasopharyngeal swab specimens and should not be used as a sole basis for treatment. Nasal washings and aspirates are unacceptable for Xpert Xpress SARS-CoV-2/FLU/RSV testing.  Fact Sheet for Patients: BloggerCourse.com  Fact Sheet for Healthcare Providers: SeriousBroker.it  This test  is not yet approved or cleared by the [place] and has been authorized for detection and/or diagnosis of SARS-CoV-2 by FDA under an Emergency Use Authorization (EUA). This EUA will remain in effect (meaning this test can be used) for the duration of the COVID-19 declaration under Section 564(b)(1) of the Act, 21 U.S.C. section 360bbb-3(b)(1), unless the authorization is terminated or revoked.     Resp Syncytial Virus by PCR NEGATIVE NEGATIVE Final    Comment: (NOTE) Fact Sheet for Patients: BloggerCourse.com  Fact Sheet for Healthcare  Providers: SeriousBroker.it  This test is not yet approved or cleared by the [place] FDA and has been authorized for detection and/or diagnosis of SARS-CoV-2 by FDA under an Emergency Use Authorization (EUA). This EUA will remain in effect (meaning this test can be used) for the duration of the COVID-19 declaration under Section 564(b)(1) of the Act, 21 U.S.C. section 360bbb-3(b)(1), unless the authorization is terminated or revoked.  Performed at [facility], [address], [place], [place] [address]      Radiology Studies: CT Head Wo Contrast Result Date: 04/18/2023 CLINICAL DATA:  Minor head trauma. EXAM: CT HEAD WITHOUT CONTRAST TECHNIQUE: Contiguous axial images were obtained from the base of the skull through the vertex without intravenous contrast. RADIATION DOSE REDUCTION: This exam was performed according to the departmental dose-optimization program which includes automated exposure control, adjustment of the mA and/or kV according to patient size and/or use of iterative reconstruction technique. COMPARISON:  02/15/2023 FINDINGS: Brain: Ventricles, cisterns and other CSF spaces are within normal. There is mild chronic ischemic microvascular disease present. Old focal infarct over the left corona radiata. No mass, mass effect, shift of midline structures or acute hemorrhage. No acute infarct. Vascular: No hyperdense vessel or unexpected calcification. Skull: Normal. Negative for fracture or focal lesion. Sinuses/Orbits: Orbits are normal. Minimal chronic inflammatory change of the sinuses. Other: None. IMPRESSION: 1. No acute findings. 2. Mild chronic ischemic microvascular disease. Small old focal infarct over the left corona radiata. Electronically Signed   By: [name] M.D.   On: 04/18/2023 17:56   DG Chest 2 View Result Date: 04/18/2023 CLINICAL DATA:  Dyspnea EXAM: CHEST - 2 VIEW COMPARISON:  12/29/2022 chest radiograph. FINDINGS:  Intact sternotomy wires. Left mediastinal CABG clips. Surgical clip in the right supraclavicular region. Stable 2 lead left subclavian pacemaker with lead tips overlying the right atrium and right ventricle. Stable cardiomediastinal silhouette with mild cardiomegaly. No pneumothorax. Chronic mild blunting of the right costophrenic angle. Small to moderate left pleural effusion, slightly increased. Patchy and streaky opacity throughout the parahilar and lower left lung, similar. Mild streaky right lung base scarring versus atelectasis. IMPRESSION: 1. Small to moderate left pleural effusion, slightly increased. 2. Chronic mild blunting of the right costophrenic angle, cannot exclude a trace right pleural effusion. 3. Patchy and streaky opacity throughout the parahilar and lower left lung, equivocal for asymmetric pulmonary edema, pneumonia, scarring and/or atelectasis. Chest radiograph follow-up advised. 4. Stable mild cardiomegaly. Electronically Signed   By: Delbert Phenix M.D.   On: 04/18/2023 15:43     Scheduled Meds:  allopurinol  100 mg Oral Daily   amLODipine  5 mg Oral QHS   apixaban  2.5 mg Oral BID   atorvastatin  20 mg Oral QHS   carvedilol  6.25 mg Oral BID WC   cholecalciferol  5,000 Units Oral Daily   finasteride  5 mg Oral Daily   furosemide  80 mg Intravenous BID  latanoprost  1 drop Both Eyes QHS   omega-3 acid ethyl esters  1 g Oral Daily   potassium chloride  20 mEq Oral BID   potassium chloride  40 mEq Oral Once   tamsulosin  0.8 mg Oral QPC supper   timolol  1 drop Both Eyes BID   Continuous Infusions:  ferric gluconate (FERRLECIT) IVPB       LOS: 1 day    Time spent:    Zannie Cove, MD [facility]   04/19/2023, 10:52 AM

## 2023-04-19 NOTE — Plan of Care (Signed)
  Problem: Education: Goal: Knowledge of General Education information will improve Description Including pain rating scale, medication(s)/side effects and non-pharmacologic comfort measures Outcome: Progressing   Problem: Health Behavior/Discharge Planning: Goal: Ability to manage health-related needs will improve Outcome: Progressing   

## 2023-04-20 DIAGNOSIS — I110 Hypertensive heart disease with heart failure: Secondary | ICD-10-CM

## 2023-04-20 DIAGNOSIS — D631 Anemia in chronic kidney disease: Secondary | ICD-10-CM | POA: Diagnosis not present

## 2023-04-20 DIAGNOSIS — I5033 Acute on chronic diastolic (congestive) heart failure: Secondary | ICD-10-CM | POA: Diagnosis not present

## 2023-04-20 DIAGNOSIS — N1832 Chronic kidney disease, stage 3b: Secondary | ICD-10-CM

## 2023-04-20 LAB — PREPARE RBC (CROSSMATCH): Order Confirmation: POSITIVE

## 2023-04-20 LAB — CBC
HCT: 24.7 % — ABNORMAL LOW (ref 39.0–52.0)
Hemoglobin: 7.9 g/dL — ABNORMAL LOW (ref 13.0–17.0)
MCH: 31.6 pg (ref 26.0–34.0)
MCHC: 32 g/dL (ref 30.0–36.0)
MCV: 98.8 fL (ref 80.0–100.0)
Platelets: 106 10*3/uL — ABNORMAL LOW (ref 150–400)
RBC: 2.5 MIL/uL — ABNORMAL LOW (ref 4.22–5.81)
RDW: 15.9 % — ABNORMAL HIGH (ref 11.5–15.5)
WBC: 5.5 10*3/uL (ref 4.0–10.5)
nRBC: 0 % (ref 0.0–0.2)

## 2023-04-20 LAB — BASIC METABOLIC PANEL
Anion gap: 11 (ref 5–15)
BUN: 58 mg/dL — ABNORMAL HIGH (ref 8–23)
CO2: 21 mmol/L — ABNORMAL LOW (ref 22–32)
Calcium: 8.9 mg/dL (ref 8.9–10.3)
Chloride: 106 mmol/L (ref 98–111)
Creatinine, Ser: 2.77 mg/dL — ABNORMAL HIGH (ref 0.61–1.24)
GFR, Estimated: 21 mL/min — ABNORMAL LOW (ref >60)
Glucose, Bld: 111 mg/dL — ABNORMAL HIGH (ref 70–99)
Potassium: 4.1 mmol/L (ref 3.5–5.1)
Sodium: 138 mmol/L (ref 135–145)

## 2023-04-20 LAB — HEMOGLOBIN AND HEMATOCRIT, BLOOD
HCT: 30.3 % — ABNORMAL LOW (ref 39.0–52.0)
Hemoglobin: 10 g/dL — ABNORMAL LOW (ref 13.0–17.0)

## 2023-04-20 LAB — MAGNESIUM: Magnesium: 2.2 mg/dL (ref 1.7–2.4)

## 2023-04-20 MED ORDER — FUROSEMIDE 10 MG/ML IJ SOLN
40.0000 mg | Freq: Once | INTRAMUSCULAR | Status: AC
  Administered 2023-04-20: 40 mg via INTRAVENOUS
  Filled 2023-04-20: qty 4

## 2023-04-20 MED ORDER — EPOETIN ALFA 40000 UNIT/ML IJ SOLN
40000.0000 [IU] | Freq: Every day | INTRAMUSCULAR | Status: DC
  Administered 2023-04-20 – 2023-04-21 (×2): 40000 [IU] via SUBCUTANEOUS
  Filled 2023-04-20 (×2): qty 1

## 2023-04-20 MED ORDER — SODIUM CHLORIDE 0.9% IV SOLUTION: Freq: Once | INTRAVENOUS | Status: AC

## 2023-04-20 NOTE — Consult Note (Signed)
[name] is well-known to me.  Is a very nice 89 year old white male.  I asked that he go to church with him.  I has been seeing him for multifactorial anemia.  He, also has superficial bladder cancer.  He comes the office and gets ESA and IV iron on occasion.  He is on anticoagulation because of chronic atrial fibrillation.  He was admitted on 04/18/2023 secondary to recurrent congestive heart failure.  When he came in, his white count is 5.4.  Hemoglobin 8.7.  Platelet count 101,000.  On 04/19/2023, white cell count 5.4.  Hemoglobin 8.  Platelet count 97,000.  His electrolytes when he came in showed a BUN of 59 creatinine 2.57.  Calcium 8.5 with an albumin of 3.5.  His beta natruretic peptide was 921.  His iron studies did show that he was iron deficient.  His iron saturation was 14%.  He denies any melena or bright red blood per rectum.  He has had a dose of IV iron with Ferrlecit.  He is getting diuresed.  This morning, his white count is 5.5.  Hemoglobin 7.9.  Platelet count 106,000.  Of note, when he came in, his corrected reticulocyte count was less than 1%.  He clearly needs ESA.  He also will need to be transfused.  He has had no fever.  His appetite is okay.  He has had no dysphagia or odynophagia.  He has had no nausea or vomiting.  He has been trying to exercise.  His poor wife has severe back problems.  He has been trying to help her out.   On his physical exam, his temperature is 97.6.  Pulse 63.  Blood pressure 144/62.  His head and neck exam shows pale conjunctiva.  He has no oral lesions.  There is no adenopathy in the neck.  Lungs are clear bilaterally.  He has some scattered crackles and wheezes bilaterally.  Cardiac exam is regular rate and rhythm.  He has a 1/6 systolic ejection murmur.  Abdomen is soft.  Bowel sounds are present.  There is no fluid wave.  There is no palpable liver or spleen tip.  Extremity shows no clubbing, cyanosis or edema.   Neurological exam is nonfocal.   [name] is a nice 89 year old white gentleman.  He has multifactorial anemia.  Again, comes in with acute on chronic heart failure.  A lot of this is from his anemia.  Again I will transfuse him 1 unit of blood.  I will start him on ESA and give him Procrit daily for right now.  He is getting IV iron which is a great idea.  I suspect that it will take several days before we get his blood To where we needed to be.  We will follow along and try to help out any way that we can.  I know that he will get incredible care from everybody upon 4 E.   Christin Bach, MD  Franky Macho 2:10

## 2023-04-20 NOTE — Progress Notes (Signed)
Mobility Specialist Progress Note:   04/20/23 1535  Mobility  Activity Ambulated with assistance to bathroom  Level of Assistance Standby assist, set-up cues, supervision of patient - no hands on  Assistive Device Front wheel walker  Distance Ambulated (ft) 30 ft  Activity Response Tolerated well  Mobility Referral Yes  Mobility visit 1 Mobility  Mobility Specialist Start Time (ACUTE ONLY) 1515  Mobility Specialist Stop Time (ACUTE ONLY) 1535  Mobility Specialist Time Calculation (min) (ACUTE ONLY) 20 min    Pt received in bed, requesting assistance to BR. No hands on needed to stand and ambulate to BR from bedside. Void successful. Pt returned to bed with call bell in reach and all needs met.   Leory Plowman  Mobility Specialist Please contact via [employer] office at [phone]

## 2023-04-20 NOTE — Progress Notes (Signed)
Heart Failure Navigator Progress Note  Assessed for Heart & Vascular TOC clinic readiness.  Patient EF 40-45%, No HF TOC per Dr. Jomarie Longs, will follow up with [facility]. .   Navigator will sign off at this time.   Rhae Hammock, BSN, Scientist, clinical (histocompatibility and immunogenetics) Only

## 2023-04-20 NOTE — Progress Notes (Signed)
PROGRESS NOTE    [name]  ZOX:[id number] DOB: 09-16-1928 DOA: 04/18/2023 PCP: Natalia Leatherwood, DO   89/M w CAD s/p CABG, PAF on Eliquis, CHB s/p PPM, chronic HFpEF (EF 50-55%), severe TR, CKD stage IIIb, anemia of CKD and iron deficiency, HTN, HLD, bladder cancer/BPH who presented to the ED for evaluation of shortness of breath and weight gain. Recently increased home Lasix recently due to progressive weight gain and SOB without significant improvement.  -Labs noted drop in hemoglobin 8.7 (baseline ~10-11), creatinine 2.57 (baseline around 2.0-2.3), BNP 921.6, troponin 27 > 26. -CXR showed small-moderate left pleural effusion, chronic mild blunting of right costophrenic angle, patchy and streaky opacities throughout the perihilar and lower left lung  Subjective: -Breathing continues to improve  Assessment and Plan:  Acute on chronic diastolic CHF -Still volume overloaded, continue IV Lasix today, weight down 7 LB -Continue carvedilol -GDMT limited by CKD, monitor urine output, BMP in a.m. -Follow-up repeat echo -Increase activity, wean O2  Acute on chronic anemia -Multifactorial, iron deficiency, CKD and possibly MDS/myeloma -Anemia panel with iron deficiency,  -Appreciate Dr. Gustavo Lah input, transfusing 1 unit PRBC today, continue IV iron  CKD (chronic kidney disease) stage 4,  -Relatively stable, monitor with increase diuretics  Hyperlipidemia LDL goal <70 Cont home statin  Bence Jones proteinuria Listed on chart. -Unclear if this is accurate, defer to Dr. Myna Hidalgo as outpatient.  A-fib (HCC) Cont Eliquis for now -Continue Coreg  DVT prophylaxis: Eliquis Code Status: DNR Family Communication: No family at bedside Disposition Plan: Home likely tomorrow  Consultants:    Procedures:   Antimicrobials:    Objective: Vitals:   04/20/23 0924 04/20/23 1113 04/20/23 1146 04/20/23 1153  BP: (!) 141/56 131/74 (!) 140/63 (!) 140/68  Pulse: 65 62 63 63  Resp:  18 17 16 13   Temp: 97.8 F (36.6 C) 98.4 F (36.9 C) 97.6 F (36.4 C) 97.9 F (36.6 C)  TempSrc:  Oral Oral Oral  SpO2:  92% 93% 95%  Weight:      Height:        Intake/Output Summary (Last 24 hours) at 04/20/2023 1222 Last data filed at 04/20/2023 1153 Gross per 24 hour  Intake 1063.57 ml  Output 1425 ml  Net -361.43 ml   Filed Weights   04/18/23 2100 04/19/23 0314 04/20/23 0255  Weight: 67.9 kg 67.9 kg 67 kg    Examination:  General exam: Elderly chronically ill male sitting up in bed, AAOx3 HEENT: Positive JVD Lungs: Few basilar Rales Abdomen: Soft, nontender, bowel sounds present Extremities: 1+ edema Skin: No rashes Psychiatry:  Mood & affect appropriate.     Data Reviewed:   CBC: Recent Labs  Lab 04/18/23 1511 04/19/23 0312 04/20/23 0247  WBC 5.4 5.4 5.5  NEUTROABS 3.5  --   --   HGB 8.7* 8.0* 7.9*  HCT 26.3* 25.0* 24.7*  MCV 97.4 97.7 98.8  PLT 101* 97* 106*   Basic Metabolic Panel: Recent Labs  Lab 04/18/23 1511 04/19/23 0312 04/20/23 0246 04/20/23 0247  NA 135 136  --  138  K 4.1 3.9  --  4.1  CL 104 104  --  106  CO2 21* 22  --  21*  GLUCOSE 139* 149*  --  111*  BUN 59* 55*  --  58*  CREATININE 2.57* 2.55*  --  2.77*  CALCIUM 8.5* 8.6*  --  8.9  MG  --   --  2.2  --    GFR: Estimated Creatinine  Clearance: 15.9 mL/min (A) (by C-G formula based on SCr of 2.77 mg/dL (H)). Liver Function Tests: Recent Labs  Lab 04/18/23 1511  AST 34  ALT 28  ALKPHOS 98  BILITOT 0.7  PROT 7.5  ALBUMIN 3.5   No results for input(s): "LIPASE", "AMYLASE" in the last 168 hours. No results for input(s): "AMMONIA" in the last 168 hours. Coagulation Profile: No results for input(s): "INR", "PROTIME" in the last 168 hours. Cardiac Enzymes: No results for input(s): "CKTOTAL", "CKMB", "CKMBINDEX", "TROPONINI" in the last 168 hours. BNP (last 3 results) Recent Labs    08/01/22 1347 09/29/22 1305 01/06/23 1341  PROBNP 6,451* 3,678* 1,570.0*    HbA1C: No results for input(s): "HGBA1C" in the last 72 hours. CBG: No results for input(s): "GLUCAP" in the last 168 hours. Lipid Profile: No results for input(s): "CHOL", "HDL", "LDLCALC", "TRIG", "CHOLHDL", "LDLDIRECT" in the last 72 hours. Thyroid Function Tests: No results for input(s): "TSH", "T4TOTAL", "FREET4", "T3FREE", "THYROIDAB" in the last 72 hours. Anemia Panel: Recent Labs    04/18/23 2134  VITAMINB12 525  FOLATE >40.0  FERRITIN 260  TIBC 315  IRON 44*  RETICCTPCT 1.3   Urine analysis:    Component Value Date/Time   COLORURINE ORANGE (A) 08/16/2017 0936   APPEARANCEUR HAZY (A) 08/16/2017 0936   LABSPEC 1.010 08/16/2017 0936   PHURINE 5.5 08/16/2017 0936   GLUCOSEU NEGATIVE 08/16/2017 0936   HGBUR TRACE (A) 08/16/2017 0936   BILIRUBINUR NEGATIVE 08/16/2017 0936   KETONESUR NEGATIVE 08/16/2017 0936   PROTEINUR 100 (A) 08/16/2017 0936   UROBILINOGEN 0.2 10/21/2008 2146   NITRITE POSITIVE (A) 08/16/2017 0936   LEUKOCYTESUR SMALL (A) 08/16/2017 0936   Sepsis Labs: @LABRCNTIP (procalcitonin:4,lacticidven:4)  ) Recent Results (from the past 240 hours)  Resp panel by RT-PCR (RSV, Flu A&B, Covid) Anterior Nasal Swab     Status: None   Collection Time: 04/18/23  3:12 PM   Specimen: Anterior Nasal Swab  Result Value Ref Range Status   SARS Coronavirus 2 by RT PCR NEGATIVE NEGATIVE Final    Comment: (NOTE) SARS-CoV-2 target nucleic acids are NOT DETECTED.  The SARS-CoV-2 RNA is generally detectable in upper respiratory specimens during the acute phase of infection. The lowest concentration of SARS-CoV-2 viral copies this assay can detect is 138 copies/mL. A negative result does not preclude SARS-Cov-2 infection and should not be used as the sole basis for treatment or other patient management decisions. A negative result may occur with  improper specimen collection/handling, submission of specimen other than nasopharyngeal swab, presence of viral  mutation(s) within the areas targeted by this assay, and inadequate number of viral copies(<138 copies/mL). A negative result must be combined with clinical observations, patient history, and epidemiological information. The expected result is Negative.  Fact Sheet for Patients:  BloggerCourse.com  Fact Sheet for Healthcare Providers:  SeriousBroker.it  This test is no t yet approved or cleared by the [place] FDA and  has been authorized for detection and/or diagnosis of SARS-CoV-2 by FDA under an Emergency Use Authorization (EUA). This EUA will remain  in effect (meaning this test can be used) for the duration of the COVID-19 declaration under Section 564(b)(1) of the Act, 21 U.S.C.section 360bbb-3(b)(1), unless the authorization is terminated  or revoked sooner.       Influenza A by PCR NEGATIVE NEGATIVE Final   Influenza B by PCR NEGATIVE NEGATIVE Final    Comment: (NOTE) The Xpert Xpress SARS-CoV-2/FLU/RSV plus assay is intended as an aid in the diagnosis of  influenza from Nasopharyngeal swab specimens and should not be used as a sole basis for treatment. Nasal washings and aspirates are unacceptable for Xpert Xpress SARS-CoV-2/FLU/RSV testing.  Fact Sheet for Patients: BloggerCourse.com  Fact Sheet for Healthcare Providers: SeriousBroker.it  This test is not yet approved or cleared by the [place] FDA and has been authorized for detection and/or diagnosis of SARS-CoV-2 by FDA under an Emergency Use Authorization (EUA). This EUA will remain in effect (meaning this test can be used) for the duration of the COVID-19 declaration under Section 564(b)(1) of the Act, 21 U.S.C. section 360bbb-3(b)(1), unless the authorization is terminated or revoked.     Resp Syncytial Virus by PCR NEGATIVE NEGATIVE Final    Comment: (NOTE) Fact Sheet for  Patients: BloggerCourse.com  Fact Sheet for Healthcare Providers: SeriousBroker.it  This test is not yet approved or cleared by the [place] FDA and has been authorized for detection and/or diagnosis of SARS-CoV-2 by FDA under an Emergency Use Authorization (EUA). This EUA will remain in effect (meaning this test can be used) for the duration of the COVID-19 declaration under Section 564(b)(1) of the Act, 21 U.S.C. section 360bbb-3(b)(1), unless the authorization is terminated or revoked.  Performed at [facility], [address] [place], [place] [address]      Radiology Studies: ECHOCARDIOGRAM COMPLETE Result Date: 04/19/2023    ECHOCARDIOGRAM REPORT   Patient Name:   MARKICE HALLIWELL Date of Exam: 04/19/2023 Medical Rec #:  [id number]        Height:       67.0 in Accession #:    [id number]       Weight:       149.8 lb Date of Birth:  [birth year]         BSA:          1.789 m Patient Age:    89 years         BP:           125/70 mmHg Patient Gender: M                HR:           63 bpm. Exam Location:  Inpatient Procedure: 2D Echo, Cardiac Doppler, Color Doppler and Intracardiac            Opacification Agent Indications:    I50.33 Acute on chronic diastolic (congestive) heart failure  History:        Patient has prior history of Echocardiogram examinations, most                 recent 08/26/2022. CHF, Arrythmias:Atrial Fibrillation; Risk                 Factors:Hypertension and Diabetes.  Sonographer:    Webb Laws Referring Phys: [id number] JARED M GARDNER IMPRESSIONS  1. Left ventricular ejection fraction, by estimation, is 35 to 40%. The left ventricle has moderately decreased function. The left ventricle demonstrates regional wall motion abnormalities (see scoring diagram/findings for description). The left ventricular internal cavity size was moderately dilated. There is mild left ventricular hypertrophy. Left ventricular  diastolic parameters are indeterminate.  2. Right ventricular systolic function is mildly reduced. The right ventricular size is severely enlarged. There is moderately elevated pulmonary artery systolic pressure. The estimated right ventricular systolic pressure is 50.0 mmHg.  3. Left atrial size was moderately dilated.  4. Right atrial size was moderately dilated.  5. The mitral valve is degenerative. Mild to moderate  mitral valve regurgitation. No evidence of mitral stenosis.  6. Tricuspid valve regurgitation is moderate to severe.  7. The aortic valve is tricuspid. Aortic valve regurgitation is trivial. Aortic valve sclerosis is present, with no evidence of aortic valve stenosis.  8. The inferior vena cava is dilated in size with <50% respiratory variability, suggesting right atrial pressure of 15 mmHg. Comparison(s): Prior images reviewed side by side. Prior study not done with contrast. Contrast study shows LV dilation, apical hypokinesis, and decrease LVEF. Valve disease appears to have somewhat improved. FINDINGS  Left Ventricle: Left ventricular ejection fraction, by estimation, is 35 to 40%. The left ventricle has moderately decreased function. The left ventricle demonstrates regional wall motion abnormalities. The left ventricular internal cavity size was moderately dilated. There is mild left ventricular hypertrophy. Left ventricular diastolic parameters are indeterminate.  LV Wall Scoring: The apical lateral segment, apical septal segment, apical anterior segment, and apical inferior segment are hypokinetic. The anterior wall, antero-lateral wall, anterior septum, inferior wall, posterior wall, mid inferoseptal segment, and basal inferoseptal segment are normal. Right Ventricle: The right ventricular size is severely enlarged. No increase in right ventricular wall thickness. Right ventricular systolic function is mildly reduced. There is moderately elevated pulmonary artery systolic pressure. The  tricuspid regurgitant velocity is 2.96 m/s, and with an assumed right atrial pressure of 15 mmHg, the estimated right ventricular systolic pressure is 50.0 mmHg. Left Atrium: Left atrial size was moderately dilated. Right Atrium: Right atrial size was moderately dilated. Pericardium: There is no evidence of pericardial effusion. Mitral Valve: The mitral valve is degenerative in appearance. Mild to moderate mitral valve regurgitation. No evidence of mitral valve stenosis. Tricuspid Valve: The tricuspid valve is normal in structure. Tricuspid valve regurgitation is moderate to severe. Aortic Valve: The aortic valve is tricuspid. Aortic valve regurgitation is trivial. Aortic valve sclerosis is present, with no evidence of aortic valve stenosis. Pulmonic Valve: The pulmonic valve was normal in structure. Pulmonic valve regurgitation is mild. No evidence of pulmonic stenosis. Aorta: The aortic root and ascending aorta are structurally normal, with no evidence of dilitation. Venous: The inferior vena cava is dilated in size with less than 50% respiratory variability, suggesting right atrial pressure of 15 mmHg. IAS/Shunts: The atrial septum is grossly normal.  LEFT VENTRICLE PLAX 2D LVIDd:         4.20 cm      Diastology LVIDs:         2.50 cm      LV e' medial:    4.46 cm/s LV PW:         1.20 cm      LV E/e' medial:  33.9 LV IVS:        1.20 cm      LV e' lateral:   6.09 cm/s LVOT diam:     2.30 cm      LV E/e' lateral: 24.8 LV SV:         72 LV SV Index:   40 LVOT Area:     4.15 cm  LV Volumes (MOD) LV vol d, MOD A2C: 172.0 ml LV vol d, MOD A4C: 147.0 ml LV vol s, MOD A2C: 112.0 ml LV vol s, MOD A4C: 97.7 ml LV SV MOD A2C:     60.0 ml LV SV MOD A4C:     147.0 ml LV SV MOD BP:      58.6 ml RIGHT VENTRICLE            IVC RV Basal  diam:  4.80 cm    IVC diam: 2.50 cm RV Mid diam:    3.30 cm RV S prime:     9.57 cm/s TAPSE (M-mode): 1.2 cm LEFT ATRIUM             Index        RIGHT ATRIUM           Index LA diam:         4.60 cm 2.57 cm/m   RA Area:     24.50 cm LA Vol (A2C):   91.6 ml 51.22 ml/m  RA Volume:   76.00 ml  42.49 ml/m LA Vol (A4C):   73.3 ml 40.98 ml/m LA Biplane Vol: 82.5 ml 46.13 ml/m  AORTIC VALVE             PULMONIC VALVE LVOT Vmax:   78.30 cm/s  PR End Diast Vel: 1.16 msec LVOT Vmean:  51.100 cm/s LVOT VTI:    0.173 m  AORTA Ao Root diam: 2.90 cm Ao Asc diam:  3.20 cm MITRAL VALVE                TRICUSPID VALVE MV Area (PHT): 9.03 cm     TR Peak grad:   35.0 mmHg MV Decel Time: 84 msec      TR Vmax:        296.00 cm/s MR Peak grad: 75.7 mmHg MR Mean grad: 45.0 mmHg     SHUNTS MR Vmax:      435.00 cm/s   Systemic VTI:  0.17 m MR Vmean:     314.0 cm/s    Systemic Diam: 2.30 cm MV E velocity: 151.00 cm/s MV A velocity: 69.00 cm/s MV E/A ratio:  2.19 Riley Lam MD Electronically signed by Riley Lam MD Signature Date/Time: 04/19/2023/1:13:03 PM    Final    CT Head Wo Contrast Result Date: 04/18/2023 CLINICAL DATA:  Minor head trauma. EXAM: CT HEAD WITHOUT CONTRAST TECHNIQUE: Contiguous axial images were obtained from the base of the skull through the vertex without intravenous contrast. RADIATION DOSE REDUCTION: This exam was performed according to the departmental dose-optimization program which includes automated exposure control, adjustment of the mA and/or kV according to patient size and/or use of iterative reconstruction technique. COMPARISON:  02/15/2023 FINDINGS: Brain: Ventricles, cisterns and other CSF spaces are within normal. There is mild chronic ischemic microvascular disease present. Old focal infarct over the left corona radiata. No mass, mass effect, shift of midline structures or acute hemorrhage. No acute infarct. Vascular: No hyperdense vessel or unexpected calcification. Skull: Normal. Negative for fracture or focal lesion. Sinuses/Orbits: Orbits are normal. Minimal chronic inflammatory change of the sinuses. Other: None. IMPRESSION: 1. No acute findings. 2. Mild  chronic ischemic microvascular disease. Small old focal infarct over the left corona radiata. Electronically Signed   By: [name] M.D.   On: 04/18/2023 17:56   DG Chest 2 View Result Date: 04/18/2023 CLINICAL DATA:  Dyspnea EXAM: CHEST - 2 VIEW COMPARISON:  12/29/2022 chest radiograph. FINDINGS: Intact sternotomy wires. Left mediastinal CABG clips. Surgical clip in the right supraclavicular region. Stable 2 lead left subclavian pacemaker with lead tips overlying the right atrium and right ventricle. Stable cardiomediastinal silhouette with mild cardiomegaly. No pneumothorax. Chronic mild blunting of the right costophrenic angle. Small to moderate left pleural effusion, slightly increased. Patchy and streaky opacity throughout the parahilar and lower left lung, similar. Mild streaky right lung base scarring versus atelectasis. IMPRESSION: 1. Small to moderate left pleural  effusion, slightly increased. 2. Chronic mild blunting of the right costophrenic angle, cannot exclude a trace right pleural effusion. 3. Patchy and streaky opacity throughout the parahilar and lower left lung, equivocal for asymmetric pulmonary edema, pneumonia, scarring and/or atelectasis. Chest radiograph follow-up advised. 4. Stable mild cardiomegaly. Electronically Signed   By: Delbert Phenix M.D.   On: 04/18/2023 15:43     Scheduled Meds:  allopurinol  100 mg Oral Daily   amLODipine  5 mg Oral QHS   apixaban  2.5 mg Oral BID   atorvastatin  20 mg Oral QHS   carvedilol  6.25 mg Oral BID WC   cholecalciferol  5,000 Units Oral Daily   epoetin alfa  40,000 Units Subcutaneous Daily   finasteride  5 mg Oral Daily   furosemide  80 mg Intravenous BID   latanoprost  1 drop Both Eyes QHS   omega-3 acid ethyl esters  1 g Oral Daily   potassium chloride  20 mEq Oral BID   tamsulosin  0.8 mg Oral QPC supper   timolol  1 drop Both Eyes BID   Continuous Infusions:  ferric gluconate (FERRLECIT) IVPB Stopped (04/19/23 1245)      LOS: 1 day    Time spent:    Zannie Cove, MD [facility]   04/20/2023, 12:22 PM

## 2023-04-20 NOTE — Progress Notes (Signed)
CCMD notified pt had 17 beats of Vtach at 1110,pt was asymptomatic,vitals checked,MD notified

## 2023-04-21 DIAGNOSIS — I5033 Acute on chronic diastolic (congestive) heart failure: Secondary | ICD-10-CM | POA: Diagnosis not present

## 2023-04-21 LAB — CBC WITH DIFFERENTIAL/PLATELET
Abs Immature Granulocytes: 0.06 10*3/uL (ref 0.00–0.07)
Basophils Absolute: 0 10*3/uL (ref 0.0–0.1)
Basophils Relative: 0 %
Eosinophils Absolute: 0.1 10*3/uL (ref 0.0–0.5)
Eosinophils Relative: 1 %
HCT: 28.9 % — ABNORMAL LOW (ref 39.0–52.0)
Hemoglobin: 9.6 g/dL — ABNORMAL LOW (ref 13.0–17.0)
Immature Granulocytes: 1 %
Lymphocytes Relative: 21 %
Lymphs Abs: 1.5 10*3/uL (ref 0.7–4.0)
MCH: 31.9 pg (ref 26.0–34.0)
MCHC: 33.2 g/dL (ref 30.0–36.0)
MCV: 96 fL (ref 80.0–100.0)
Monocytes Absolute: 0.8 10*3/uL (ref 0.1–1.0)
Monocytes Relative: 12 %
Neutro Abs: 4.6 10*3/uL (ref 1.7–7.7)
Neutrophils Relative %: 65 %
Platelets: 110 10*3/uL — ABNORMAL LOW (ref 150–400)
RBC: 3.01 MIL/uL — ABNORMAL LOW (ref 4.22–5.81)
RDW: 16.7 % — ABNORMAL HIGH (ref 11.5–15.5)
WBC: 7 10*3/uL (ref 4.0–10.5)
nRBC: 0 % (ref 0.0–0.2)

## 2023-04-21 LAB — BASIC METABOLIC PANEL
Anion gap: 11 (ref 5–15)
BUN: 65 mg/dL — ABNORMAL HIGH (ref 8–23)
CO2: 22 mmol/L (ref 22–32)
Calcium: 9.2 mg/dL (ref 8.9–10.3)
Chloride: 105 mmol/L (ref 98–111)
Creatinine, Ser: 2.72 mg/dL — ABNORMAL HIGH (ref 0.61–1.24)
GFR, Estimated: 21 mL/min — ABNORMAL LOW (ref >60)
Glucose, Bld: 103 mg/dL — ABNORMAL HIGH (ref 70–99)
Potassium: 3.9 mmol/L (ref 3.5–5.1)
Sodium: 138 mmol/L (ref 135–145)

## 2023-04-21 LAB — TYPE AND SCREEN
ABO/RH(D): A POS
Antibody Screen: NEGATIVE
Unit division: 0

## 2023-04-21 LAB — BPAM RBC
Blood Product Expiration Date: 202501072359
ISSUE DATE / TIME: 202412230901
Unit Type and Rh: 6200

## 2023-04-21 LAB — RETICULOCYTES
Immature Retic Fract: 18.9 % — ABNORMAL HIGH (ref 2.3–15.9)
RBC.: 3.02 MIL/uL — ABNORMAL LOW (ref 4.22–5.81)
Retic Count, Absolute: 53.5 10*3/uL (ref 19.0–186.0)
Retic Ct Pct: 1.8 % (ref 0.4–3.1)

## 2023-04-21 MED ORDER — SODIUM CHLORIDE 0.9 % IV SOLN
250.0000 mg | Freq: Every day | INTRAVENOUS | Status: AC
  Administered 2023-04-21: 250 mg via INTRAVENOUS
  Filled 2023-04-21: qty 20

## 2023-04-21 MED ORDER — FUROSEMIDE 40 MG PO TABS: 40.0000 mg | ORAL_TABLET | Freq: Two times a day (BID) | ORAL | 1 refills | Status: DC

## 2023-04-21 NOTE — Progress Notes (Signed)
Mobility Specialist Progress Note:   04/21/23 1110  Mobility  Activity Ambulated with assistance in hallway  Level of Assistance Contact guard assist, steadying assist  Assistive Device Front wheel walker  Distance Ambulated (ft) 200 ft  Activity Response Tolerated well  Mobility Referral Yes  Mobility visit 1 Mobility  Mobility Specialist Start Time (ACUTE ONLY) 1055  Mobility Specialist Stop Time (ACUTE ONLY) 1105  Mobility Specialist Time Calculation (min) (ACUTE ONLY) 10 min    Pt received in bed, agreeable to mobility. SB to stand. CG during ambulation for safety. Pt denied any discomfort during ambulation, asx throughout. VSS. Pt returned to bed with call bell in reach and all needs met.   Leory Plowman  Mobility Specialist Please contact via [employer] office at [phone]

## 2023-04-21 NOTE — Discharge Summary (Addendum)
Physician Discharge Summary  DEARIES MARIEN ZOX:[id number] DOB: 1928-12-01 DOA: 04/18/2023  PCP: Natalia Leatherwood, DO  Admit date: 04/18/2023 Discharge date: 04/21/2023  Time spent: 45 minutes  Recommendations for Outpatient Follow-up:  Cardiology Dr. Charlton Haws in 2 weeks PCP in 1 week, please check BMP at follow-up Outpatient palliative care if his functional status declines  Discharge Diagnoses:  Principal Problem:   Acute on chronic heart failure with preserved ejection fraction (HFpEF) (HCC) Active Problems:   CKD (chronic kidney disease) stage 4, GFR 15-29 ml/min (HCC)   Anemia   Essential hypertension   A-fib (HCC)   Bence Jones proteinuria   Hyperlipidemia LDL goal <70   CHF (congestive heart failure) (HCC)   Discharge Condition: Improved  Diet recommendation: [place] DM, heart healthy  Filed Weights   04/19/23 0314 04/20/23 0255 04/21/23 0552  Weight: 67.9 kg 67 kg 66.1 kg    History of present illness:  89/M w CAD s/p CABG, PAF on Eliquis, CHB s/p PPM, chronic combined CHF, severe TR, CKD stage IIIb, anemia of CKD and iron deficiency, HTN, HLD, bladder cancer/BPH who presented to the ED for evaluation of shortness of breath and weight gain. Recently increased home Lasix recently due to progressive weight gain and SOB without significant improvement.  -Labs noted drop in hemoglobin 8.7 (baseline ~10-11), creatinine 2.57 (baseline around 2.0-2.3), BNP 921.6, troponin 27 > 26. -CXR showed small-moderate left pleural effusion, chronic mild blunting of right costophrenic angle, patchy and streaky opacities throughout the perihilar and lower left lung  Hospital Course:  Acute on chronic combined CHF -Previous echoes with EF ranging from 40 all the way to 50/55%  -Diuresed with IV Lasix, weight down 11lb, improved, weaned off O2  -Repeat echo noted EF down to 35-40% with wall motion abnormality, mildly reduced RV  -Unfortunately with 89 advanced age frailty, worsening  anemia and CKD 4 he is not a good candidate for ischemic eval  -Conservative management discussed with the patient, he agrees, transition to oral Lasix 40 Mg twice daily, continue carvedilol  -GDMT limited by CKD 4 -Discharged home in a stable condition, follow-up with Dr. Eden Emms in 2 weeks   Acute on chronic anemia -Multifactorial, iron deficiency, CKD and possibly MDS/myeloma -Anemia panel with iron deficiency,  -Appreciate Dr. Gustavo Lah input, transfuse 1 unit PRBC, given IV iron and erythropoietin as well, hemoglobin improving   CKD (chronic kidney disease) stage 4,  -Relatively stable, diuretics as above   Hyperlipidemia LDL goal <70 Cont home statin   Bence Jones proteinuria Listed on chart. -Unclear if this is accurate, defer to Dr. Myna Hidalgo as outpatient.   A-fib (HCC) Cont Eliquis for now -Continue Coreg  Discharge Exam: Vitals:   04/21/23 0738 04/21/23 0928  BP: (!) 165/69 121/61  Pulse: 61 63  Resp: 19 13  Temp:    SpO2: 93% 92%   General exam: Elderly chronically ill male sitting up in bed, AAOx3 HEENT: Positive JVD Lungs: Few basilar Rales Abdomen: Soft, nontender, bowel sounds present Extremities: 1+ edema Skin: No rashes Psychiatry:  Mood & affect appropriate.   Discharge Instructions   Discharge Instructions     Diet - low sodium heart healthy   Complete by: As directed    Discharge instructions   Complete by: As directed    Follow low-dose salt diet, avoid processed food, check daily weights, call your cardiologist if your weight goes up 3 pounds in 1 day or 5 pounds in 1 week   Increase activity slowly  Complete by: As directed    No wound care   Complete by: As directed       Allergies as of 04/21/2023       Reactions   Lisinopril Other (See Comments)    he has difficulty controlling potassium on ace/arb- lisinopril was dc'd in the past 2/2 to hyperkalemia documented by [facility] PCP 01/2021   Penicillins Hives   Has patient had a PCN  reaction causing immediate rash, facial/tongue/throat swelling, SOB or lightheadedness with hypotension: No Has patient had a PCN reaction causing severe rash involving mucus membranes or skin necrosis: Yes Has patient had a PCN reaction that required hospitalization: No Has patient had a PCN reaction occurring within the last 10 years: No If all of the above answers are "NO", then may proceed with Cephalosporin use.   Vancomycin Other (See Comments)   Edema and myalgia        Medication List     TAKE these medications    allopurinol 100 MG tablet Commonly known as: ZYLOPRIM Take 1 tablet (100 mg total) by mouth daily.   amLODipine 5 MG tablet Commonly known as: NORVASC Take 1 tablet (5 mg total) by mouth at bedtime.   atorvastatin 20 MG tablet Commonly known as: LIPITOR Take 1 tablet (20 mg total) by mouth at bedtime.   carvedilol 6.25 MG tablet Commonly known as: COREG Take 1 tablet (6.25 mg total) by mouth 2 (two) times daily with a meal.   Cholecalciferol 125 MCG (5000 UT) capsule Take 5,000 Units by mouth daily.   Darbepoetin Alfa 300 MCG/0.6ML Sosy injection Commonly known as: ARANESP Inject 300 mcg into the skin once.   dicyclomine 10 MG capsule Commonly known as: BENTYL Take 1 capsule (10 mg total) by mouth every 6 (six) hours as needed.   Eliquis 2.5 MG Tabs tablet Generic drug: apixaban TAKE 1 TABLET BY MOUTH TWICE A DAY   Feraheme 510 MG/17ML Soln injection Generic drug: ferumoxytol Inject 510 mg into the vein once.   finasteride 5 MG tablet Commonly known as: PROSCAR Take 5 mg by mouth daily.   fish oil-omega-3 fatty acids 1000 MG capsule Take 1 g by mouth daily.   fluticasone 50 MCG/ACT nasal spray Commonly known as: FLONASE Place 2 sprays into both nostrils daily.   furosemide 40 MG tablet Commonly known as: LASIX Take 1 tablet (40 mg total) by mouth 2 (two) times daily. What changed:  how much to take when to take this additional  instructions   latanoprost 0.005 % ophthalmic solution Commonly known as: XALATAN Place 1 drop into both eyes at bedtime.   onetouch ultrasoft lancets Use to check sugar daily as instructed.   OneTouch Verio test strip Generic drug: glucose blood Use to check sugar daily as instructed.   Potassium Chloride ER 20 MEQ Tbcr Take 1 tablet (20 mEq total) by mouth 2 (two) times daily.   PRESERVISION AREDS PO Take 1 tablet by mouth 2 (two) times daily.   PROBIOTIC PO Take 1 tablet by mouth daily.   tamsulosin 0.4 MG Caps capsule Commonly known as: FLOMAX Take 0.8 mg by mouth daily after supper.   timolol 0.5 % ophthalmic solution Commonly known as: TIMOPTIC Place 1 drop into both eyes 2 (two) times daily.   UNABLE TO FIND Take 2 tablets by mouth every morning. Med Name: Mito Q (takes place of Co Q 10)       Allergies  Allergen Reactions   Lisinopril Other (See Comments)  he has difficulty controlling potassium on ace/arb- lisinopril was dc'd in the past 2/2 to hyperkalemia documented by [facility] PCP 01/2021   Penicillins Hives    Has patient had a PCN reaction causing immediate rash, facial/tongue/throat swelling, SOB or lightheadedness with hypotension: No Has patient had a PCN reaction causing severe rash involving mucus membranes or skin necrosis: Yes Has patient had a PCN reaction that required hospitalization: No Has patient had a PCN reaction occurring within the last 10 years: No If all of the above answers are "NO", then may proceed with Cephalosporin use.    Vancomycin Other (See Comments)    Edema and myalgia      The results of significant diagnostics from this hospitalization (including imaging, microbiology, ancillary and laboratory) are listed below for reference.    Significant Diagnostic Studies: ECHOCARDIOGRAM COMPLETE Result Date: 04/19/2023    ECHOCARDIOGRAM REPORT   Patient Name:   CARRON NEE Date of Exam: 04/19/2023 Medical Rec #:   [id number]        Height:       67.0 in Accession #:    [id number]       Weight:       149.8 lb Date of Birth:  January 08, 1929         BSA:          1.789 m Patient Age:    89 years         BP:           125/70 mmHg Patient Gender: M                HR:           63 bpm. Exam Location:  Inpatient Procedure: 2D Echo, Cardiac Doppler, Color Doppler and Intracardiac            Opacification Agent Indications:    I50.33 Acute on chronic diastolic (congestive) heart failure  History:        Patient has prior history of Echocardiogram examinations, most                 recent 08/26/2022. CHF, Arrythmias:Atrial Fibrillation; Risk                 Factors:Hypertension and Diabetes.  Sonographer:    Webb Laws Referring Phys: [id number] JARED M GARDNER IMPRESSIONS  1. Left ventricular ejection fraction, by estimation, is 35 to 40%. The left ventricle has moderately decreased function. The left ventricle demonstrates regional wall motion abnormalities (see scoring diagram/findings for description). The left ventricular internal cavity size was moderately dilated. There is mild left ventricular hypertrophy. Left ventricular diastolic parameters are indeterminate.  2. Right ventricular systolic function is mildly reduced. The right ventricular size is severely enlarged. There is moderately elevated pulmonary artery systolic pressure. The estimated right ventricular systolic pressure is 50.0 mmHg.  3. Left atrial size was moderately dilated.  4. Right atrial size was moderately dilated.  5. The mitral valve is degenerative. Mild to moderate mitral valve regurgitation. No evidence of mitral stenosis.  6. Tricuspid valve regurgitation is moderate to severe.  7. The aortic valve is tricuspid. Aortic valve regurgitation is trivial. Aortic valve sclerosis is present, with no evidence of aortic valve stenosis.  8. The inferior vena cava is dilated in size with <50% respiratory variability, suggesting right atrial pressure of 15 mmHg.  Comparison(s): Prior images reviewed side by side. Prior study not done with contrast. Contrast study shows LV dilation, apical hypokinesis, and decrease LVEF. Valve  disease appears to have somewhat improved. FINDINGS  Left Ventricle: Left ventricular ejection fraction, by estimation, is 35 to 40%. The left ventricle has moderately decreased function. The left ventricle demonstrates regional wall motion abnormalities. The left ventricular internal cavity size was moderately dilated. There is mild left ventricular hypertrophy. Left ventricular diastolic parameters are indeterminate.  LV Wall Scoring: The apical lateral segment, apical septal segment, apical anterior segment, and apical inferior segment are hypokinetic. The anterior wall, antero-lateral wall, anterior septum, inferior wall, posterior wall, mid inferoseptal segment, and basal inferoseptal segment are normal. Right Ventricle: The right ventricular size is severely enlarged. No increase in right ventricular wall thickness. Right ventricular systolic function is mildly reduced. There is moderately elevated pulmonary artery systolic pressure. The tricuspid regurgitant velocity is 2.96 m/s, and with an assumed right atrial pressure of 15 mmHg, the estimated right ventricular systolic pressure is 50.0 mmHg. Left Atrium: Left atrial size was moderately dilated. Right Atrium: Right atrial size was moderately dilated. Pericardium: There is no evidence of pericardial effusion. Mitral Valve: The mitral valve is degenerative in appearance. Mild to moderate mitral valve regurgitation. No evidence of mitral valve stenosis. Tricuspid Valve: The tricuspid valve is normal in structure. Tricuspid valve regurgitation is moderate to severe. Aortic Valve: The aortic valve is tricuspid. Aortic valve regurgitation is trivial. Aortic valve sclerosis is present, with no evidence of aortic valve stenosis. Pulmonic Valve: The pulmonic valve was normal in structure. Pulmonic  valve regurgitation is mild. No evidence of pulmonic stenosis. Aorta: The aortic root and ascending aorta are structurally normal, with no evidence of dilitation. Venous: The inferior vena cava is dilated in size with less than 50% respiratory variability, suggesting right atrial pressure of 15 mmHg. IAS/Shunts: The atrial septum is grossly normal.  LEFT VENTRICLE PLAX 2D LVIDd:         4.20 cm      Diastology LVIDs:         2.50 cm      LV e' medial:    4.46 cm/s LV PW:         1.20 cm      LV E/e' medial:  33.9 LV IVS:        1.20 cm      LV e' lateral:   6.09 cm/s LVOT diam:     2.30 cm      LV E/e' lateral: 24.8 LV SV:         72 LV SV Index:   40 LVOT Area:     4.15 cm  LV Volumes (MOD) LV vol d, MOD A2C: 172.0 ml LV vol d, MOD A4C: 147.0 ml LV vol s, MOD A2C: 112.0 ml LV vol s, MOD A4C: 97.7 ml LV SV MOD A2C:     60.0 ml LV SV MOD A4C:     147.0 ml LV SV MOD BP:      58.6 ml RIGHT VENTRICLE            IVC RV Basal diam:  4.80 cm    IVC diam: 2.50 cm RV Mid diam:    3.30 cm RV S prime:     9.57 cm/s TAPSE (M-mode): 1.2 cm LEFT ATRIUM             Index        RIGHT ATRIUM           Index LA diam:        4.60 cm 2.57 cm/m   RA Area:  24.50 cm LA Vol (A2C):   91.6 ml 51.22 ml/m  RA Volume:   76.00 ml  42.49 ml/m LA Vol (A4C):   73.3 ml 40.98 ml/m LA Biplane Vol: 82.5 ml 46.13 ml/m  AORTIC VALVE             PULMONIC VALVE LVOT Vmax:   78.30 cm/s  PR End Diast Vel: 1.16 msec LVOT Vmean:  51.100 cm/s LVOT VTI:    0.173 m  AORTA Ao Root diam: 2.90 cm Ao Asc diam:  3.20 cm MITRAL VALVE                TRICUSPID VALVE MV Area (PHT): 9.03 cm     TR Peak grad:   35.0 mmHg MV Decel Time: 84 msec      TR Vmax:        296.00 cm/s MR Peak grad: 75.7 mmHg MR Mean grad: 45.0 mmHg     SHUNTS MR Vmax:      435.00 cm/s   Systemic VTI:  0.17 m MR Vmean:     314.0 cm/s    Systemic Diam: 2.30 cm MV E velocity: 151.00 cm/s MV A velocity: 69.00 cm/s MV E/A ratio:  2.19 Riley Lam MD Electronically signed by Riley Lam MD Signature Date/Time: 04/19/2023/1:13:03 PM    Final    CT Head Wo Contrast Result Date: 04/18/2023 CLINICAL DATA:  Minor head trauma. EXAM: CT HEAD WITHOUT CONTRAST TECHNIQUE: Contiguous axial images were obtained from the base of the skull through the vertex without intravenous contrast. RADIATION DOSE REDUCTION: This exam was performed according to the departmental dose-optimization program which includes automated exposure control, adjustment of the mA and/or kV according to patient size and/or use of iterative reconstruction technique. COMPARISON:  02/15/2023 FINDINGS: Brain: Ventricles, cisterns and other CSF spaces are within normal. There is mild chronic ischemic microvascular disease present. Old focal infarct over the left corona radiata. No mass, mass effect, shift of midline structures or acute hemorrhage. No acute infarct. Vascular: No hyperdense vessel or unexpected calcification. Skull: Normal. Negative for fracture or focal lesion. Sinuses/Orbits: Orbits are normal. Minimal chronic inflammatory change of the sinuses. Other: None. IMPRESSION: 1. No acute findings. 2. Mild chronic ischemic microvascular disease. Small old focal infarct over the left corona radiata. Electronically Signed   By: [name] M.D.   On: 04/18/2023 17:56   DG Chest 2 View Result Date: 04/18/2023 CLINICAL DATA:  Dyspnea EXAM: CHEST - 2 VIEW COMPARISON:  12/29/2022 chest radiograph. FINDINGS: Intact sternotomy wires. Left mediastinal CABG clips. Surgical clip in the right supraclavicular region. Stable 2 lead left subclavian pacemaker with lead tips overlying the right atrium and right ventricle. Stable cardiomediastinal silhouette with mild cardiomegaly. No pneumothorax. Chronic mild blunting of the right costophrenic angle. Small to moderate left pleural effusion, slightly increased. Patchy and streaky opacity throughout the parahilar and lower left lung, similar. Mild streaky right lung base  scarring versus atelectasis. IMPRESSION: 1. Small to moderate left pleural effusion, slightly increased. 2. Chronic mild blunting of the right costophrenic angle, cannot exclude a trace right pleural effusion. 3. Patchy and streaky opacity throughout the parahilar and lower left lung, equivocal for asymmetric pulmonary edema, pneumonia, scarring and/or atelectasis. Chest radiograph follow-up advised. 4. Stable mild cardiomegaly. Electronically Signed   By: Delbert Phenix M.D.   On: 04/18/2023 15:43    Microbiology: Recent Results (from the past 240 hours)  Resp panel by RT-PCR (RSV, Flu A&B, Covid) Anterior Nasal Swab  Status: None   Collection Time: 04/18/23  3:12 PM   Specimen: Anterior Nasal Swab  Result Value Ref Range Status   SARS Coronavirus 2 by RT PCR NEGATIVE NEGATIVE Final    Comment: (NOTE) SARS-CoV-2 target nucleic acids are NOT DETECTED.  The SARS-CoV-2 RNA is generally detectable in upper respiratory specimens during the acute phase of infection. The lowest concentration of SARS-CoV-2 viral copies this assay can detect is 138 copies/mL. A negative result does not preclude SARS-Cov-2 infection and should not be used as the sole basis for treatment or other patient management decisions. A negative result may occur with  improper specimen collection/handling, submission of specimen other than nasopharyngeal swab, presence of viral mutation(s) within the areas targeted by this assay, and inadequate number of viral copies(<138 copies/mL). A negative result must be combined with clinical observations, patient history, and epidemiological information. The expected result is Negative.  Fact Sheet for Patients:  BloggerCourse.com  Fact Sheet for Healthcare Providers:  SeriousBroker.it  This test is no t yet approved or cleared by the [place] FDA and  has been authorized for detection and/or diagnosis of SARS-CoV-2 by FDA  under an Emergency Use Authorization (EUA). This EUA will remain  in effect (meaning this test can be used) for the duration of the COVID-19 declaration under Section 564(b)(1) of the Act, 21 U.S.C.section 360bbb-3(b)(1), unless the authorization is terminated  or revoked sooner.       Influenza A by PCR NEGATIVE NEGATIVE Final   Influenza B by PCR NEGATIVE NEGATIVE Final    Comment: (NOTE) The Xpert Xpress SARS-CoV-2/FLU/RSV plus assay is intended as an aid in the diagnosis of influenza from Nasopharyngeal swab specimens and should not be used as a sole basis for treatment. Nasal washings and aspirates are unacceptable for Xpert Xpress SARS-CoV-2/FLU/RSV testing.  Fact Sheet for Patients: BloggerCourse.com  Fact Sheet for Healthcare Providers: SeriousBroker.it  This test is not yet approved or cleared by the [place] FDA and has been authorized for detection and/or diagnosis of SARS-CoV-2 by FDA under an Emergency Use Authorization (EUA). This EUA will remain in effect (meaning this test can be used) for the duration of the COVID-19 declaration under Section 564(b)(1) of the Act, 21 U.S.C. section 360bbb-3(b)(1), unless the authorization is terminated or revoked.     Resp Syncytial Virus by PCR NEGATIVE NEGATIVE Final    Comment: (NOTE) Fact Sheet for Patients: BloggerCourse.com  Fact Sheet for Healthcare Providers: SeriousBroker.it  This test is not yet approved or cleared by the [place] FDA and has been authorized for detection and/or diagnosis of SARS-CoV-2 by FDA under an Emergency Use Authorization (EUA). This EUA will remain in effect (meaning this test can be used) for the duration of the COVID-19 declaration under Section 564(b)(1) of the Act, 21 U.S.C. section 360bbb-3(b)(1), unless the authorization is terminated or revoked.  Performed at [facility], [address] [place], [place] [address]      Labs: Basic Metabolic Panel: Recent Labs  Lab 04/18/23 1511 04/19/23 0312 04/20/23 0246 04/20/23 0247 04/21/23 0300  NA 135 136  --  138 138  K 4.1 3.9  --  4.1 3.9  CL 104 104  --  106 105  CO2 21* 22  --  21* 22  GLUCOSE 139* 149*  --  111* 103*  BUN 59* 55*  --  58* 65*  CREATININE 2.57* 2.55*  --  2.77* 2.72*  CALCIUM 8.5* 8.6*  --  8.9 9.2  MG  --   --  2.2  --   --    Liver Function Tests: Recent Labs  Lab 04/18/23 1511  AST 34  ALT 28  ALKPHOS 98  BILITOT 0.7  PROT 7.5  ALBUMIN 3.5   No results for input(s): "LIPASE", "AMYLASE" in the last 168 hours. No results for input(s): "AMMONIA" in the last 168 hours. CBC: Recent Labs  Lab 04/18/23 1511 04/19/23 0312 04/20/23 0247 04/20/23 1553 04/21/23 0300  WBC 5.4 5.4 5.5  --  7.0  NEUTROABS 3.5  --   --   --  4.6  HGB 8.7* 8.0* 7.9* 10.0* 9.6*  HCT 26.3* 25.0* 24.7* 30.3* 28.9*  MCV 97.4 97.7 98.8  --  96.0  PLT 101* 97* 106*  --  110*   Cardiac Enzymes: No results for input(s): "CKTOTAL", "CKMB", "CKMBINDEX", "TROPONINI" in the last 168 hours. BNP: BNP (last 3 results) Recent Labs    05/12/22 1518 12/29/22 1903 04/18/23 1511  BNP 1,574.9* 1,768.2* 921.6*    ProBNP (last 3 results) Recent Labs    08/01/22 1347 09/29/22 1305 01/06/23 1341  PROBNP 6,451* 3,678* 1,570.0*    CBG: No results for input(s): "GLUCAP" in the last 168 hours.     Signed:  Zannie Cove MD.  [facility] 04/21/2023, 11:54 AM

## 2023-04-21 NOTE — Plan of Care (Signed)

## 2023-04-21 NOTE — Progress Notes (Signed)
Satchel says he feels a lot better.  He got blood yesterday.  He got iron yesterday.  He got Procrit yesterday.  His CBC today shows a white cell count 7.  Hemoglobin 9.6.  Platelet count 110,000.  I think he will get another dose of iron today.  He should get another dose of Procrit.  It sounds like he might be going home today.  His "color" looks a lot better.  His BUN is 65 creatinine 2.72.  Hopefully, this is trending downward.  His appetite might be a little bit better.  His vital signs show temperature 98.  Pulse 60.  Blood pressure 143/60.  His lungs sound relatively clear bilaterally.  He may have a couple crackles at the bases.  I do not hear any wheezing.  Cardiac exam regular rate and rhythm.  Abdomen is slightly distended.  Abdomen is soft.  He has decent bowel sounds.  There is no obvious fluid wave.  Extremities shows no clubbing, cyanosis or edema.  Neurological exam is nonfocal.   Mr. Jansky came in with acute on chronic heart failure.  Some of this was from his anemia.  Again, he needs to get iron today.  Needs to get a dose of Procrit today.  He needs to make an appointment to come back to see [place] in the office in 2 weeks.  We need to keep on top of his anemia.  I am just glad that he is feeling better and going home so he can enjoy Christmas at home with his wife.   Christin Bach, MD  Loraine Leriche 1:11

## 2023-04-21 NOTE — Progress Notes (Signed)
Discharge instructions reviewed with pt and his son.  Copy of instructions given to pt. No changes in medications, script sent to pt's pharmacy for lasix (? Refill), son states he has some at home, but will pick these up as well. Pt currently getting IV iron and will discharge as soon as this is complete.  Pt will be d/c'd via wheelchair with belongings, with his son and will be escorted by hospital volunteer/staff.   Taylin Mans,RN SWOT

## 2023-04-23 ENCOUNTER — Telehealth: Payer: Self-pay

## 2023-04-23 NOTE — Transitions of Care (Post Inpatient/ED Visit) (Signed)
   04/23/2023  Name: [name] MRN: [id number] DOB: 03-May-1928  Today's TOC FU Call Status: Today's TOC FU Call Status:: Successful TOC FU Call Completed TOC FU Call Complete Date: 04/23/23 Patient's Name and Date of Birth confirmed.  Transition Care Management Follow-up Telephone Call Date of Discharge: 04/21/23 Discharge Facility: [facility]) Type of Discharge: Inpatient Admission Primary Inpatient Discharge Diagnosis:: Congestive Heart Failure How have you been since you were released from the hospital?: Better (Patient states he is doing well, no concerns) Any questions or concerns?: No  Items Reviewed: Did you receive and understand the discharge instructions provided?: Yes Medications obtained,verified, and reconciled?: No Medications Not Reviewed Reasons:: Other: (Patient notes no medication changes and did not have time to review) Any new allergies since your discharge?: No Dietary orders reviewed?: No Do you have support at home?: Yes People in Home: spouse Name of Support/Comfort Primary Source: [name]  Medications Reviewed Today: Medications Reviewed Today   Medications were not reviewed in this encounter     Home Care and Equipment/Supplies: Were Home Health Services Ordered?: No Any new equipment or medical supplies ordered?: No  Functional Questionnaire: Do you need assistance with bathing/showering or dressing?: No Do you need assistance with meal preparation?: No Do you need assistance with eating?: No Do you have difficulty maintaining continence: No Do you need assistance with getting out of bed/getting out of a chair/moving?: No Do you have difficulty managing or taking your medications?: No  Follow up appointments reviewed: PCP Follow-up appointment confirmed?: No (Patient to call for follow up) MD Provider Line Number:[phone] Given: No Specialist Hospital Follow-up appointment confirmed?: No Reason Specialist Follow-Up Not Confirmed:  Patient has Specialist Provider Number and will Call for Appointment Do you need transportation to your follow-up appointment?: No Do you understand care options if your condition(s) worsen?: Yes-patient verbalized understanding  SDOH Interventions Today    Flowsheet Row Most Recent Value  SDOH Interventions   Food Insecurity Interventions Intervention Not Indicated  Housing Interventions Intervention Not Indicated  Transportation Interventions Intervention Not Indicated  Utilities Interventions Intervention Not Indicated      Jodelle Gross RN, BSN, CCM RN Care Manager  Transitions of Care  [facility]  [phone]

## 2023-04-28 ENCOUNTER — Ambulatory Visit (INDEPENDENT_AMBULATORY_CARE_PROVIDER_SITE_OTHER): Payer: Medicare Other

## 2023-04-28 DIAGNOSIS — I442 Atrioventricular block, complete: Secondary | ICD-10-CM

## 2023-04-28 DIAGNOSIS — I4819 Other persistent atrial fibrillation: Secondary | ICD-10-CM

## 2023-04-28 LAB — CUP PACEART REMOTE DEVICE CHECK
Battery Remaining Longevity: 52 mo
Battery Remaining Percentage: 45 %
Battery Voltage: 2.98 V
Brady Statistic RV Percent Paced: 99 %
Date Time Interrogation Session: 20241231020018
Implantable Lead Connection Status: 753985
Implantable Lead Connection Status: 753985
Implantable Lead Implant Date: 20190402
Implantable Lead Implant Date: 20190402
Implantable Lead Location: 753859
Implantable Lead Location: 753860
Implantable Pulse Generator Implant Date: 20190402
Lead Channel Impedance Value: 410 Ohm
Lead Channel Pacing Threshold Amplitude: 0.5 V
Lead Channel Pacing Threshold Pulse Width: 0.5 ms
Lead Channel Sensing Intrinsic Amplitude: 9.9 mV
Lead Channel Setting Pacing Amplitude: 2.5 V
Lead Channel Setting Pacing Pulse Width: 0.5 ms
Lead Channel Setting Sensing Sensitivity: 2 mV
Pulse Gen Model: 2272
Pulse Gen Serial Number: 9003454

## 2023-05-04 ENCOUNTER — Other Ambulatory Visit: Payer: Self-pay | Admitting: Family Medicine

## 2023-05-05 ENCOUNTER — Other Ambulatory Visit: Payer: Self-pay | Admitting: Family Medicine

## 2023-05-13 ENCOUNTER — Telehealth: Payer: Self-pay | Admitting: Cardiovascular Disease

## 2023-05-13 NOTE — Telephone Encounter (Signed)
 Pt c/o swelling/edema: STAT if pt has developed SOB within 24 hours  If swelling, where is the swelling located? Stomach, ankles,feet  How much weight have you gained and in what time span? 3lbs in a day  Have you gained 2 pounds in a day or 5 pounds in a week? yes  Do you have a log of your daily weights (if so, list)? no  Are you currently taking a fluid pill? yes  Are you currently SOB? Only with exertion  Have you traveled recently in a car or plane for an extended period of time? no

## 2023-05-13 NOTE — Telephone Encounter (Signed)
 Called patient back about message. Patient stated that he has gain 7 pounds over a week. Patient complaining of BLE edema. Right leg is usually swollen due to history of bypass, but left leg has some pitting edema. Patient's SBP 120-140 and HR is normal. Patient stated he has been watching his salt intake, and swelling is not going down with elevating legs. Patient takes lasix  40 mg BID, and last creatinine was 2.72 and BUN 65. This was during hospital stay last month. Patient has not has any lab work since then. Patient stated he has been in the hospital several times for excessive fluid. Patient stated the time before last the hospital took off 17 quarts of fluid and he felt great. This past time patient stated he didn't feel as great and they had to give him several units of blood. Consulted Dr. Nishan. He advised patient to take lasix  80 mg in the morning and 40 mg in the evening tomorrow and follow up with his PCP or nephrologist, and have them check his BMET and CBC.  Patient verbalized understanding and will follow up with nephrology.

## 2023-05-19 DIAGNOSIS — H353134 Nonexudative age-related macular degeneration, bilateral, advanced atrophic with subfoveal involvement: Secondary | ICD-10-CM | POA: Diagnosis not present

## 2023-05-22 DIAGNOSIS — N184 Chronic kidney disease, stage 4 (severe): Secondary | ICD-10-CM | POA: Diagnosis not present

## 2023-05-27 ENCOUNTER — Encounter: Payer: Self-pay | Admitting: Hematology & Oncology

## 2023-05-27 ENCOUNTER — Inpatient Hospital Stay (HOSPITAL_BASED_OUTPATIENT_CLINIC_OR_DEPARTMENT_OTHER): Payer: Medicare Other | Admitting: Hematology & Oncology

## 2023-05-27 ENCOUNTER — Encounter: Payer: Self-pay | Admitting: *Deleted

## 2023-05-27 ENCOUNTER — Inpatient Hospital Stay: Payer: Medicare Other | Attending: Hematology & Oncology

## 2023-05-27 ENCOUNTER — Inpatient Hospital Stay: Payer: Medicare Other

## 2023-05-27 VITALS — BP 139/50 | HR 62 | Temp 97.6°F | Resp 19 | Ht 67.0 in | Wt 147.8 lb

## 2023-05-27 DIAGNOSIS — N183 Chronic kidney disease, stage 3 unspecified: Secondary | ICD-10-CM

## 2023-05-27 DIAGNOSIS — D509 Iron deficiency anemia, unspecified: Secondary | ICD-10-CM

## 2023-05-27 DIAGNOSIS — C673 Malignant neoplasm of anterior wall of bladder: Secondary | ICD-10-CM | POA: Diagnosis not present

## 2023-05-27 DIAGNOSIS — D500 Iron deficiency anemia secondary to blood loss (chronic): Secondary | ICD-10-CM

## 2023-05-27 DIAGNOSIS — D631 Anemia in chronic kidney disease: Secondary | ICD-10-CM | POA: Diagnosis not present

## 2023-05-27 DIAGNOSIS — N184 Chronic kidney disease, stage 4 (severe): Secondary | ICD-10-CM | POA: Diagnosis not present

## 2023-05-27 DIAGNOSIS — D508 Other iron deficiency anemias: Secondary | ICD-10-CM

## 2023-05-27 LAB — CBC WITH DIFFERENTIAL (CANCER CENTER ONLY)
Abs Immature Granulocytes: 0.02 10*3/uL (ref 0.00–0.07)
Basophils Absolute: 0 10*3/uL (ref 0.0–0.1)
Basophils Relative: 0 %
Eosinophils Absolute: 0.1 10*3/uL (ref 0.0–0.5)
Eosinophils Relative: 2 %
HCT: 33.3 % — ABNORMAL LOW (ref 39.0–52.0)
Hemoglobin: 10.9 g/dL — ABNORMAL LOW (ref 13.0–17.0)
Immature Granulocytes: 0 %
Lymphocytes Relative: 26 %
Lymphs Abs: 1.7 10*3/uL (ref 0.7–4.0)
MCH: 31.8 pg (ref 26.0–34.0)
MCHC: 32.7 g/dL (ref 30.0–36.0)
MCV: 97.1 fL (ref 80.0–100.0)
Monocytes Absolute: 0.5 10*3/uL (ref 0.1–1.0)
Monocytes Relative: 8 %
Neutro Abs: 4.2 10*3/uL (ref 1.7–7.7)
Neutrophils Relative %: 64 %
Platelet Count: 109 10*3/uL — ABNORMAL LOW (ref 150–400)
RBC: 3.43 MIL/uL — ABNORMAL LOW (ref 4.22–5.81)
RDW: 16.3 % — ABNORMAL HIGH (ref 11.5–15.5)
WBC Count: 6.6 10*3/uL (ref 4.0–10.5)
nRBC: 0 % (ref 0.0–0.2)

## 2023-05-27 LAB — CMP (CANCER CENTER ONLY)
ALT: 36 U/L (ref 0–44)
AST: 49 U/L — ABNORMAL HIGH (ref 15–41)
Albumin: 3.8 g/dL (ref 3.5–5.0)
Alkaline Phosphatase: 96 U/L (ref 38–126)
Anion gap: 11 (ref 5–15)
BUN: 64 mg/dL — ABNORMAL HIGH (ref 8–23)
CO2: 29 mmol/L (ref 22–32)
Calcium: 9.1 mg/dL (ref 8.9–10.3)
Chloride: 100 mmol/L (ref 98–111)
Creatinine: 2.6 mg/dL — ABNORMAL HIGH (ref 0.61–1.24)
GFR, Estimated: 22 mL/min — ABNORMAL LOW (ref >60)
Glucose, Bld: 123 mg/dL — ABNORMAL HIGH (ref 70–99)
Potassium: 4.4 mmol/L (ref 3.5–5.1)
Sodium: 140 mmol/L (ref 135–145)
Total Bilirubin: 0.6 mg/dL (ref 0.0–1.2)
Total Protein: 7.1 g/dL (ref 6.5–8.1)

## 2023-05-27 LAB — IRON AND IRON BINDING CAPACITY (CC-WL,HP ONLY)
Iron: 79 ug/dL (ref 45–182)
Saturation Ratios: 28 % (ref 17.9–39.5)
TIBC: 286 ug/dL (ref 250–450)
UIBC: 207 ug/dL (ref 117–376)

## 2023-05-27 LAB — FERRITIN: Ferritin: 507 ng/mL — ABNORMAL HIGH (ref 24–336)

## 2023-05-27 MED ORDER — DARBEPOETIN ALFA 300 MCG/0.6ML IJ SOSY
300.0000 ug | PREFILLED_SYRINGE | Freq: Once | INTRAMUSCULAR | Status: AC
Start: 2023-05-27 — End: 2023-05-27
  Administered 2023-05-27: 300 ug via SUBCUTANEOUS
  Filled 2023-05-27: qty 0.6

## 2023-05-27 NOTE — Patient Instructions (Addendum)

## 2023-05-27 NOTE — Progress Notes (Signed)
Hematology and Oncology Follow Up Visit  [name] [id number] 01-17-1929 89 y.o. 05/27/2023   Principle Diagnosis:  Erythropoietin deficiency anemia Iron deficiency anemia Chronic atrial fibrillation  Current Therapy:   IV iron as indicated -- feraheme given on 09/18/2021 Aranesp 300 g sq prn hemoglobin < 11 - last dose given on  09/19/2021 Eliquis 2.5 mg p.o. twice daily   Interim History:  [name] is here today for follow-up.  I typically see him in church every week or so.  For the most part, I know that his poor wife is not doing well.  She has a terrible back and it is very hard for her to get around.  He has history of congestive heart failure.  He was in the hospital recently with heart failure.  He was quite anemic.  He got transfused.  He did get some ESA.  We did give him some IV iron as iron saturation was only 14%.  He comes in with his daughter.  He is doing pretty well right now.  He has had no complaints.  He has had no nausea or vomiting.  There is been no problems bowels or bladder.  He has had no rashes.  He does have some leg swelling which is chronic.  He is on Eliquis for chronic atrial fibrillation.  He  does have bladder cancer.  He has had intravesicular therapy in the past.  His appetite is doing okay.  Thankfully, he has had no problems with COVID.  Currently, I would say his performance status is probably ECOG 2.  actually may be taking a medication to try to help this.   Medications:  Allergies as of 05/27/2023       Reactions   Lisinopril Other (See Comments)    he has difficulty controlling potassium on ace/arb- lisinopril was dc'd in the past 2/2 to hyperkalemia documented by [facility] PCP 01/2021   Penicillins Hives   Has patient had a PCN reaction causing immediate rash, facial/tongue/throat swelling, SOB or lightheadedness with hypotension: No Has patient had a PCN reaction causing severe rash involving mucus membranes or skin  necrosis: Yes Has patient had a PCN reaction that required hospitalization: No Has patient had a PCN reaction occurring within the last 10 years: No If all of the above answers are "NO", then may proceed with Cephalosporin use.   Vancomycin Other (See Comments)   Edema and myalgia        Medication List        Accurate as of May 27, 2023  2:02 PM. If you have any questions, ask your nurse or doctor.          allopurinol 100 MG tablet Commonly known as: ZYLOPRIM Take 1 tablet (100 mg total) by mouth daily.   amLODipine 5 MG tablet Commonly known as: NORVASC TAKE 1 TABLET BY MOUTH EVERYDAY AT BEDTIME   atorvastatin 20 MG tablet Commonly known as: LIPITOR Take 1 tablet (20 mg total) by mouth at bedtime.   carvedilol 6.25 MG tablet Commonly known as: COREG Take 1 tablet (6.25 mg total) by mouth 2 (two) times daily with a meal.   Cholecalciferol 125 MCG (5000 UT) capsule Take 5,000 Units by mouth daily.   Darbepoetin Alfa 300 MCG/0.6ML Sosy injection Commonly known as: ARANESP Inject 300 mcg into the skin once.   dicyclomine 10 MG capsule Commonly known as: BENTYL Take 1 capsule (10 mg total) by mouth every 6 (six) hours as needed.   Eliquis  2.5 MG Tabs tablet Generic drug: apixaban TAKE 1 TABLET BY MOUTH TWICE A DAY   Feraheme 510 MG/17ML Soln injection Generic drug: ferumoxytol Inject 510 mg into the vein once.   finasteride 5 MG tablet Commonly known as: PROSCAR Take 5 mg by mouth daily.   fish oil-omega-3 fatty acids 1000 MG capsule Take 1 g by mouth daily.   fluticasone 50 MCG/ACT nasal spray Commonly known as: FLONASE Place 2 sprays into both nostrils daily.   furosemide 40 MG tablet Commonly known as: LASIX Take 1 tablet (40 mg total) by mouth 2 (two) times daily.   latanoprost 0.005 % ophthalmic solution Commonly known as: XALATAN Place 1 drop into both eyes at bedtime.   onetouch ultrasoft lancets Use to check sugar daily as  instructed.   OneTouch Verio test strip Generic drug: glucose blood Use to check sugar daily as instructed.   Potassium Chloride ER 20 MEQ Tbcr Take 1 tablet (20 mEq total) by mouth 2 (two) times daily.   PRESERVISION AREDS PO Take 1 tablet by mouth 2 (two) times daily.   PROBIOTIC PO Take 1 tablet by mouth daily.   tamsulosin 0.4 MG Caps capsule Commonly known as: FLOMAX Take 0.8 mg by mouth daily after supper.   timolol 0.5 % ophthalmic solution Commonly known as: TIMOPTIC Place 1 drop into both eyes 2 (two) times daily.   UNABLE TO FIND Take 2 tablets by mouth every morning. Med Name: [name] (takes place of Co Q 10)        Allergies:  Allergies  Allergen Reactions   Lisinopril Other (See Comments)     he has difficulty controlling potassium on ace/arb- lisinopril was dc'd in the past 2/2 to hyperkalemia documented by [facility] PCP 01/2021   Penicillins Hives    Has patient had a PCN reaction causing immediate rash, facial/tongue/throat swelling, SOB or lightheadedness with hypotension: No Has patient had a PCN reaction causing severe rash involving mucus membranes or skin necrosis: Yes Has patient had a PCN reaction that required hospitalization: No Has patient had a PCN reaction occurring within the last 10 years: No If all of the above answers are "NO", then may proceed with Cephalosporin use.    Vancomycin Other (See Comments)    Edema and myalgia    Past Medical History, Surgical history, Social history, and Family History were reviewed and updated.  Review of Systems: Review of Systems  Constitutional: Negative.   HENT: Negative.    Eyes: Negative.   Respiratory: Negative.    Cardiovascular:  Positive for palpitations.  Gastrointestinal: Negative.   Genitourinary: Negative.   Musculoskeletal: Negative.   Skin: Negative.   Neurological: Negative.   Endo/Heme/Allergies: Negative.   Psychiatric/Behavioral: Negative.       Physical Exam: Vital  signs show temperature of 97.6.  Pulse 62.  Blood pressure 139/50.  Weight is 147 pounds.   Wt Readings from Last 3 Encounters:  05/27/23 147 lb 12.8 oz (67 kg)  04/21/23 145 lb 11.6 oz (66.1 kg)  03/06/23 150 lb (68 kg)    Physical Exam Vitals reviewed.  HENT:     Head: Normocephalic and atraumatic.  Eyes:     Pupils: Pupils are equal, round, and reactive to light.  Cardiovascular:     Rate and Rhythm: Normal rate and regular rhythm.     Heart sounds: Normal heart sounds.  Pulmonary:     Effort: Pulmonary effort is normal.     Breath sounds: Normal breath sounds.  Abdominal:     General: Bowel sounds are normal.     Palpations: Abdomen is soft.  Musculoskeletal:        General: No tenderness or deformity. Normal range of motion.     Cervical back: Normal range of motion.  Lymphadenopathy:     Cervical: No cervical adenopathy.  Skin:    General: Skin is warm and dry.     Findings: No erythema or rash.  Neurological:     Mental Status: He is alert and oriented to person, place, and time.  Psychiatric:        Behavior: Behavior normal.        Thought Content: Thought content normal.        Judgment: Judgment normal.      Lab Results  Component Value Date   WBC 6.6 05/27/2023   HGB 10.9 (L) 05/27/2023   HCT 33.3 (L) 05/27/2023   MCV 97.1 05/27/2023   PLT 109 (L) 05/27/2023   Lab Results  Component Value Date   FERRITIN 260 04/18/2023   IRON 44 (L) 04/18/2023   TIBC 315 04/18/2023   UIBC 271 04/18/2023   IRONPCTSAT 14 (L) 04/18/2023   Lab Results  Component Value Date   RETICCTPCT 1.8 04/21/2023   RBC 3.43 (L) 05/27/2023   No results found for: "KPAFRELGTCHN", "LAMBDASER", "KAPLAMBRATIO" No results found for: "IGGSERUM", "IGA", "IGMSERUM" Lab Results  Component Value Date   TOTALPROTELP 6.0 (L) 12/26/2015   ALBUMINELP 3.4 (L) 12/26/2015   A1GS 0.4 (H) 12/26/2015   A2GS 0.8 12/26/2015   BETS 0.4 12/26/2015   BETA2SER 0.3 12/26/2015   GAMS 0.8  12/26/2015   MSPIKE Not Observed 01/29/2016   SPEI SEE NOTE 12/26/2015     Chemistry      Component Value Date/Time   NA 140 05/27/2023 1314   NA 137 09/29/2022 1305   NA 145 04/09/2017 0905   NA 140 03/28/2016 0853   K 4.4 05/27/2023 1314   K 4.5 04/09/2017 0905   K 4.4 03/28/2016 0853   CL 100 05/27/2023 1314   CL 105 04/09/2017 0905   CO2 29 05/27/2023 1314   CO2 27 04/09/2017 0905   CO2 19 (L) 03/28/2016 0853   BUN 64 (H) 05/27/2023 1314   BUN 39 (H) 09/29/2022 1305   BUN 33 (H) 04/09/2017 0905   BUN 34.4 (H) 03/28/2016 0853   CREATININE 2.60 (H) 05/27/2023 1314   CREATININE 2.35 (H) 01/23/2023 1430   CREATININE 1.7 (H) 03/28/2016 0853   GLU 113 07/24/2016 0000      Component Value Date/Time   CALCIUM 9.1 05/27/2023 1314   CALCIUM 9.2 04/09/2017 0905   CALCIUM 9.2 03/28/2016 0853   ALKPHOS 96 05/27/2023 1314   ALKPHOS 69 04/09/2017 0905   ALKPHOS 85 03/28/2016 0853   AST 49 (H) 05/27/2023 1314   AST 23 03/28/2016 0853   ALT 36 05/27/2023 1314   ALT 27 04/09/2017 0905   ALT 37 03/28/2016 0853   BILITOT 0.6 05/27/2023 1314   BILITOT 0.79 03/28/2016 0853      Impression and Plan: Mr. Stammer is a very pleasant 89 yo caucasian gentleman with both erythropoietin deficiency and iron deficiency anemia.   We will go ahead and give him Aranesp today.  Hopefully, we will be able to keep his blood count of so he will not have issues with heart failure.  We will see what his iron levels look like.  We will plan to get him back in about  a month.  I know it is difficult for him to get down here.  I do see him in church whenever he can make it depending on his wife's health.    Josph Macho, MD 1/29/20252:02 PM

## 2023-05-28 DIAGNOSIS — I5020 Unspecified systolic (congestive) heart failure: Secondary | ICD-10-CM | POA: Diagnosis not present

## 2023-05-28 DIAGNOSIS — E8770 Fluid overload, unspecified: Secondary | ICD-10-CM | POA: Diagnosis not present

## 2023-05-28 DIAGNOSIS — I129 Hypertensive chronic kidney disease with stage 1 through stage 4 chronic kidney disease, or unspecified chronic kidney disease: Secondary | ICD-10-CM | POA: Diagnosis not present

## 2023-05-28 DIAGNOSIS — E1122 Type 2 diabetes mellitus with diabetic chronic kidney disease: Secondary | ICD-10-CM | POA: Diagnosis not present

## 2023-05-28 DIAGNOSIS — I4891 Unspecified atrial fibrillation: Secondary | ICD-10-CM | POA: Diagnosis not present

## 2023-05-28 DIAGNOSIS — I10 Essential (primary) hypertension: Secondary | ICD-10-CM | POA: Diagnosis not present

## 2023-05-28 DIAGNOSIS — N189 Chronic kidney disease, unspecified: Secondary | ICD-10-CM | POA: Diagnosis not present

## 2023-05-28 DIAGNOSIS — N184 Chronic kidney disease, stage 4 (severe): Secondary | ICD-10-CM | POA: Diagnosis not present

## 2023-05-28 DIAGNOSIS — D631 Anemia in chronic kidney disease: Secondary | ICD-10-CM | POA: Diagnosis not present

## 2023-05-29 LAB — CBC AND DIFFERENTIAL
HCT: 32 — AB (ref 41–53)
Hemoglobin: 10.6 — AB (ref 13.5–17.5)
Neutrophils Absolute: 2.8
Platelets: 95 10*3/uL — AB (ref 150–400)
WBC: 4.8

## 2023-05-29 LAB — COMPREHENSIVE METABOLIC PANEL
Albumin: 3.6 (ref 3.5–5.0)
Calcium: 8.2 — AB (ref 8.7–10.7)
eGFR: 27

## 2023-05-29 LAB — BASIC METABOLIC PANEL
BUN: 62 — AB (ref 4–21)
CO2: 21 (ref 13–22)
Chloride: 100 (ref 99–108)
Creatinine: 2.3 — AB (ref 0.6–1.3)
Glucose: 128
Potassium: 4.1 meq/L (ref 3.5–5.1)
Sodium: 139 (ref 137–147)

## 2023-05-29 LAB — CBC: RBC: 3.34 — AB (ref 3.87–5.11)

## 2023-06-08 DIAGNOSIS — C44212 Basal cell carcinoma of skin of right ear and external auricular canal: Secondary | ICD-10-CM | POA: Diagnosis not present

## 2023-06-08 DIAGNOSIS — D1801 Hemangioma of skin and subcutaneous tissue: Secondary | ICD-10-CM | POA: Diagnosis not present

## 2023-06-08 DIAGNOSIS — D485 Neoplasm of uncertain behavior of skin: Secondary | ICD-10-CM | POA: Diagnosis not present

## 2023-06-08 DIAGNOSIS — L821 Other seborrheic keratosis: Secondary | ICD-10-CM | POA: Diagnosis not present

## 2023-06-08 DIAGNOSIS — D692 Other nonthrombocytopenic purpura: Secondary | ICD-10-CM | POA: Diagnosis not present

## 2023-06-08 DIAGNOSIS — L570 Actinic keratosis: Secondary | ICD-10-CM | POA: Diagnosis not present

## 2023-06-08 DIAGNOSIS — Z85828 Personal history of other malignant neoplasm of skin: Secondary | ICD-10-CM | POA: Diagnosis not present

## 2023-06-09 NOTE — Progress Notes (Signed)
Remote pacemaker transmission.

## 2023-06-15 ENCOUNTER — Other Ambulatory Visit: Payer: Self-pay | Admitting: Cardiovascular Disease

## 2023-06-15 DIAGNOSIS — I4819 Other persistent atrial fibrillation: Secondary | ICD-10-CM

## 2023-06-16 NOTE — Telephone Encounter (Signed)
Prescription refill request for Eliquis received. Indication:afib Last office visit:10/24 Scr:2.3  1/25 Age: 89 Weight:67  kg  Prescription refilled

## 2023-06-23 ENCOUNTER — Inpatient Hospital Stay: Payer: Medicare Other

## 2023-06-23 ENCOUNTER — Encounter: Payer: Medicare Other | Admitting: Family Medicine

## 2023-06-23 ENCOUNTER — Inpatient Hospital Stay: Payer: Medicare Other | Admitting: Family

## 2023-06-23 ENCOUNTER — Inpatient Hospital Stay: Payer: Medicare Other | Attending: Hematology & Oncology

## 2023-06-23 DIAGNOSIS — L603 Nail dystrophy: Secondary | ICD-10-CM | POA: Diagnosis not present

## 2023-06-23 DIAGNOSIS — E1151 Type 2 diabetes mellitus with diabetic peripheral angiopathy without gangrene: Secondary | ICD-10-CM | POA: Diagnosis not present

## 2023-06-23 DIAGNOSIS — I739 Peripheral vascular disease, unspecified: Secondary | ICD-10-CM | POA: Diagnosis not present

## 2023-06-23 DIAGNOSIS — L84 Corns and callosities: Secondary | ICD-10-CM | POA: Diagnosis not present

## 2023-06-24 ENCOUNTER — Ambulatory Visit: Payer: Medicare Other | Admitting: Family Medicine

## 2023-06-24 ENCOUNTER — Encounter: Payer: Self-pay | Admitting: Family Medicine

## 2023-06-24 VITALS — BP 132/66 | HR 92 | Temp 97.8°F | Wt 140.8 lb

## 2023-06-24 DIAGNOSIS — R7303 Prediabetes: Secondary | ICD-10-CM | POA: Diagnosis not present

## 2023-06-24 DIAGNOSIS — I480 Paroxysmal atrial fibrillation: Secondary | ICD-10-CM | POA: Diagnosis not present

## 2023-06-24 DIAGNOSIS — E785 Hyperlipidemia, unspecified: Secondary | ICD-10-CM

## 2023-06-24 DIAGNOSIS — E559 Vitamin D deficiency, unspecified: Secondary | ICD-10-CM | POA: Diagnosis not present

## 2023-06-24 DIAGNOSIS — D631 Anemia in chronic kidney disease: Secondary | ICD-10-CM

## 2023-06-24 DIAGNOSIS — N184 Chronic kidney disease, stage 4 (severe): Secondary | ICD-10-CM

## 2023-06-24 DIAGNOSIS — I5033 Acute on chronic diastolic (congestive) heart failure: Secondary | ICD-10-CM

## 2023-06-24 DIAGNOSIS — D500 Iron deficiency anemia secondary to blood loss (chronic): Secondary | ICD-10-CM | POA: Diagnosis not present

## 2023-06-24 DIAGNOSIS — D6869 Other thrombophilia: Secondary | ICD-10-CM | POA: Diagnosis not present

## 2023-06-24 DIAGNOSIS — K580 Irritable bowel syndrome with diarrhea: Secondary | ICD-10-CM | POA: Diagnosis not present

## 2023-06-24 DIAGNOSIS — I25708 Atherosclerosis of coronary artery bypass graft(s), unspecified, with other forms of angina pectoris: Secondary | ICD-10-CM

## 2023-06-24 DIAGNOSIS — I10 Essential (primary) hypertension: Secondary | ICD-10-CM | POA: Diagnosis not present

## 2023-06-24 LAB — POCT GLYCOSYLATED HEMOGLOBIN (HGB A1C)
HbA1c POC (<> result, manual entry): 5.7 % (ref 4.0–5.6)
HbA1c, POC (controlled diabetic range): 5.7 % (ref 0.0–7.0)
HbA1c, POC (prediabetic range): 5.7 % (ref 5.7–6.4)
Hemoglobin A1C: 5.7 % — AB (ref 4.0–5.6)

## 2023-06-24 MED ORDER — ATORVASTATIN CALCIUM 20 MG PO TABS: 20.0000 mg | ORAL_TABLET | Freq: Every day | ORAL | 3 refills | Status: DC

## 2023-06-24 MED ORDER — DICYCLOMINE HCL 10 MG PO CAPS: 10.0000 mg | ORAL_CAPSULE | Freq: Four times a day (QID) | ORAL | 3 refills | Status: DC | PRN

## 2023-06-24 MED ORDER — CARVEDILOL 6.25 MG PO TABS: 6.2500 mg | ORAL_TABLET | Freq: Two times a day (BID) | ORAL | 1 refills | Status: DC

## 2023-06-24 MED ORDER — POTASSIUM CHLORIDE ER 20 MEQ PO TBCR: 20.0000 meq | EXTENDED_RELEASE_TABLET | Freq: Two times a day (BID) | ORAL | 1 refills | Status: DC

## 2023-06-24 MED ORDER — AMLODIPINE BESYLATE 5 MG PO TABS: 5.0000 mg | ORAL_TABLET | Freq: Every day | ORAL | 1 refills | Status: DC

## 2023-06-24 NOTE — Progress Notes (Signed)
 err

## 2023-06-24 NOTE — Progress Notes (Signed)
 [name] , [birth year], 89 y.o., male MRN: [id number] Patient Care Team    Relationship Specialty Notifications Start End  Natalia Leatherwood, DO PCP - General Family Medicine  12/29/22   Wendall Stade, MD PCP - Cardiology Cardiology  03/17/17   Hillis Range, MD (Inactive) PCP - Electrophysiology Cardiology  06/26/21   Janet Berlin, MD Consulting Physician Ophthalmology  12/18/15   Mathews Robinsons, MD Consulting Physician Dermatology  12/18/15   Wendall Stade, MD Consulting Physician Cardiology  12/18/15   Stephannie Li, MD Consulting Physician Ophthalmology  12/19/15   Josph Macho, MD Consulting Physician Oncology  09/30/16   Arita Miss, MD Attending Physician Nephrology  09/30/16   Donzetta Starch, MD Consulting Physician Dermatology  09/30/16   Marcine Matar, MD Consulting Physician Urology  06/30/19   Merwyn Katos, DPM Consulting Physician Podiatry  06/30/19     Chief Complaint  Patient presents with   Hypertension    Subjective: [name] is a 89 y.o. male present for Chronic Conditions/illness Management All past medical history, surgical history, allergies, family history, immunizations and social history was obtained from the patient today and entered into the electronic medical record.  Hypertension//A.Fib/IDA/chronic anticoagulation Patient reports compliance with atorvastatin 20 mg daily Coreg 6.25  twice a day and amlodipine 5 mg QD.   Diet: Low sodium diet followed Exercise: very active RF: CKD3, HLD, CAD w/ CABG (1999), cardiac cath 2011 2/2 CP (all grafts normal), PVD, Afib Significant cardiac history of CAD with prior CABG in 1999 -LIMA to the LAD, SVG to IM, OM1, OM 2, SVG to D1, SVG to PDA/PLA, EF 45-50% with mild diffuse hypokinesis. Grafts patent by cath 2011   Nuclear stress test 08/2015 scar, no ischemia LVEF 70% also has atrial fibrillation/flutter in 2014 on Eliquis status post [facility] 10/2014, PVD, claudication.  Cardioversion 04/01/2017.  Pacemaker  implant 07/28/2017. 08/26/2022 echocardiogram: Ejection fraction reported as low normal (left ventricular EF 50-55% %) with mild/moderate mitral regurg.  Severe tricuspid regurg from A-fib. Right ventricular volume overloaded Right ventricular systolic function mildly reduced, right ventricular size is mildly enlarged. Moderately elevated pulmonary artery systolic pressure with pulmonic valve regurgitation moderate Left atrium severely dilated Right atrial size moderately dilated Patient has a moderately sized PFO with left-to-right shunting present.  BPH/cancer anterior wall of urinary bladder: Managed by oncology Prediabetes:  He has been a prediabetic -has been controlled with diet alone Gout: Patient reports gout is well-controlled on allopurinol 150 mg daily.  Renally dosed CKD/vitamin D deficiency: Patient reports compliance 5000 units vitamin D daily. IBS: Patient reports his IBS is well-controlled-but he does have take the Bentyl as needed.  Allergies  Allergen Reactions   Lisinopril Other (See Comments)     he has difficulty controlling potassium on ace/arb- lisinopril was dc'd in the past 2/2 to hyperkalemia documented by [facility] P[facility] 01/2021   Penicillins Hives    Has patient had a PCN reaction causing immediate rash, facial/tongue/throat swelling, SOB or lightheadedness with hypotension: No Has patient had a PCN reaction causing severe rash involving mucus membranes or skin necrosis: Yes Has patient had a PCN reaction that required hospitalization: No Has patient had a PCN reaction occurring within the last 10 years: No If all of the above answers are "NO", then may proceed with Cephalosporin use.    Vancomycin Other (See Comments)    Edema and myalgia   Social History   Tobacco Use   Smoking status: Former  Smokeless tobacco: Former    Types: Chew    Quit date: 02/03/1969  Substance Use Topics   Alcohol use: No    Alcohol/week: 0.0 standard drinks of alcohol   Past  Medical History:  Diagnosis Date   A-fib (HCC)    Basal cell carcinoma    skin   Bigeminal rhythm    Bradycardia 06/21/2017   Cancer of anterior wall of urinary bladder (HCC) 07/18/2019   Cataract    Chronic renal insufficiency, stage 3 (moderate) (HCC) 2018   GFR 30s-40s   Coronary artery disease    post bypass   CVD (cardiovascular disease)    Diabetes mellitus without complication (HCC)    type 2 diet controlled   Diverticular disease    Easy bruising    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   Gout    Hematuria 06/30/2019   Hernia    Hyperkalemia 05/06/2017   Hyperlipidemia    Hypertension    IBS (irritable bowel syndrome)    Iron deficiency anemia 02/04/2016   Macular degeneration    Microscopic colitis    PVD (peripheral vascular disease) (HCC)    Past Surgical History:  Procedure Laterality Date   CARDIAC CATHETERIZATION  2006   CARDIOVERSION N/A 02/22/2013   Procedure: CARDIOVERSION;  Surgeon: Wendall Stade, MD;  Location: [facility] ENDOSCOPY;  Service: Cardiovascular;  Laterality: N/A;   CARDIOVERSION N/A 11/15/2014   Procedure: CARDIOVERSION;  Surgeon: Wendall Stade, MD;  Location: [facility] ENDOSCOPY;  Service: Cardiovascular;  Laterality: N/A;   CARDIOVERSION N/A 04/01/2017   Procedure: CARDIOVERSION;  Surgeon: Laurey Morale, MD;  Location: [facility] ENDOSCOPY;  Service: Cardiovascular;  Laterality: N/A;   CAROTID ENDARTERECTOMY  2009/ 1993   left/ right   COLONOSCOPY  03/17/2005   The colon is normal.   CORONARY ARTERY BYPASS GRAFT  1999   CYSTOSCOPY W/ RETROGRADES Bilateral 07/14/2019   Procedure: CYSTOSCOPY WITH RETROGRADE PYELOGRAM;  Surgeon: Marcine Matar, MD;  Location: [facility];  Service: Urology;  Laterality: Bilateral;   ESOPHAGOGASTRODUODENOSCOPY  03/17/2005   Normal esophagus. Normal Stomanch. Normal duodenum.    EYE SURGERY     eyelid repair   INGUINAL HERNIA REPAIR  02/11/2012   Procedure: LAPAROSCOPIC BILATERAL INGUINAL HERNIA REPAIR;   Surgeon: Valarie Merino, MD;  Location: [facility] ORS;  Service: General;  Laterality: Bilateral;   PACEMAKER IMPLANT N/A 07/28/2017   [employer] Assurity MRI conditional  dual-chamber pacemaker for symptomatic second degree AV block by Dr Johney Frame   SKIN CANCER EXCISION     right ear x 3   TRANSURETHRAL RESECTION OF BLADDER TUMOR N/A 09/05/2019   Procedure: TRANSURETHRAL RESECTION OF BLADDER TUMOR (TURBT);  Surgeon: Marcine Matar, MD;  Location[name];  Service: Urology;  Laterality: N/A;   TRANSURETHRAL RESECTION OF BLADDER TUMOR WITH MITOMYCIN-C N/A 07/14/2019   Procedure: TRANSURETHRAL RESECTION OF BLADDER TUMOR WITH GEMCITABINE IN PACU;  Surgeon: Marcine Matar, MD;  Location: [facility];  Service: Urology;  Laterality: N/A;   Family History  Problem Relation Age of Onset   Stroke Mother    Coronary artery disease Father    Heart disease Father    Melanoma Sister    Colon cancer Neg Hx    Allergies as of 06/24/2023       Reactions   Lisinopril Other (See Comments)    he has difficulty controlling potassium on ace/arb- lisinopril was dc'd in the past 2/2 to hyperkalemia documented by [facility] P[facility] 01/2021  Penicillins Hives   Has patient had a PCN reaction causing immediate rash, facial/tongue/throat swelling, SOB or lightheadedness with hypotension: No Has patient had a PCN reaction causing severe rash involving mucus membranes or skin necrosis: Yes Has patient had a PCN reaction that required hospitalization: No Has patient had a PCN reaction occurring within the last 10 years: No If all of the above answers are "NO", then may proceed with Cephalosporin use.   Vancomycin Other (See Comments)   Edema and myalgia        Medication List        Accurate as of June 24, 2023  2:42 PM. If you have any questions, ask your nurse or doctor.          allopurinol 100 MG tablet Commonly known as: ZYLOPRIM Take 1 tablet (100 mg total)  by mouth daily.   amLODipine 5 MG tablet Commonly known as: NORVASC Take 1 tablet (5 mg total) by mouth daily. What changed: See the new instructions. Changed by: Felix Pacini   atorvastatin 20 MG tablet Commonly known as: LIPITOR Take 1 tablet (20 mg total) by mouth at bedtime.   carvedilol 6.25 MG tablet Commonly known as: COREG Take 1 tablet (6.25 mg total) by mouth 2 (two) times daily with a meal.   Cholecalciferol 125 MCG (5000 UT) capsule Take 5,000 Units by mouth daily.   Darbepoetin Alfa 300 MCG/0.6ML Sosy injection Commonly known as: ARANESP Inject 300 mcg into the skin once.   dicyclomine 10 MG capsule Commonly known as: BENTYL Take 1 capsule (10 mg total) by mouth every 6 (six) hours as needed.   Eliquis 2.5 MG Tabs tablet Generic drug: apixaban TAKE 1 TABLET BY MOUTH TWICE A DAY   Feraheme 510 MG/17ML Soln injection Generic drug: ferumoxytol Inject 510 mg into the vein once.   finasteride 5 MG tablet Commonly known as: PROSCAR Take 5 mg by mouth daily.   fish oil-omega-3 fatty acids 1000 MG capsule Take 1 g by mouth daily.   fluticasone 50 MCG/ACT nasal spray Commonly known as: FLONASE Place 2 sprays into both nostrils daily.   furosemide 40 MG tablet Commonly known as: LASIX Take 1 tablet (40 mg total) by mouth 2 (two) times daily. What changed:  when to take this additional instructions   latanoprost 0.005 % ophthalmic solution Commonly known as: XALATAN Place 1 drop into both eyes at bedtime.   onetouch ultrasoft lancets Use to check sugar daily as instructed.   OneTouch Verio test strip Generic drug: glucose blood Use to check sugar daily as instructed.   Potassium Chloride ER 20 MEQ Tbcr Take 1 tablet (20 mEq total) by mouth 2 (two) times daily.   PRESERVISION AREDS PO Take 1 tablet by mouth 2 (two) times daily.   PROBIOTIC PO Take 1 tablet by mouth daily.   tamsulosin 0.4 MG Caps capsule Commonly known as: FLOMAX Take 0.8 mg  by mouth daily after supper.   timolol 0.5 % ophthalmic solution Commonly known as: TIMOPTIC Place 1 drop into both eyes 2 (two) times daily.   UNABLE TO FIND Take 2 tablets by mouth every morning. Med Name: [name] (takes place of Co Q 10)        Results for orders placed or performed in visit on 06/24/23 (from the past 24 hours)  POCT HgB A1C     Status: Abnormal   Collection Time: 06/24/23  1:35 PM  Result Value Ref Range   Hemoglobin A1C 5.7 (A) 4.0 -  5.6 %   HbA1c POC (<> result, manual entry) 5.7 4.0 - 5.6 %   HbA1c, POC (prediabetic range) 5.7 5.7 - 6.4 %   HbA1c, POC (controlled diabetic range) 5.7 0.0 - 7.0 %    No results found.   ROS: Negative, with the exception of above mentioned in HPI  Objective:  BP 132/66   Pulse 92   Temp 97.8 F (36.6 C)   Wt 140 lb 12.8 oz (63.9 kg)   SpO2 98%   BMI 22.05 kg/m  Body mass index is 22.05 kg/m.  Physical Exam Vitals and nursing note reviewed.  Constitutional:      General: He is not in acute distress.    Appearance: Normal appearance. He is not ill-appearing, toxic-appearing or diaphoretic.  HENT:     Head: Normocephalic and atraumatic.     Mouth/Throat:     Mouth: Mucous membranes are moist.  Eyes:     General: No scleral icterus.       Right eye: No discharge.        Left eye: No discharge.     Extraocular Movements: Extraocular movements intact.     Pupils: Pupils are equal, round, and reactive to light.  Cardiovascular:     Rate and Rhythm: Normal rate and regular rhythm.     Heart sounds: Murmur heard.  Pulmonary:     Effort: Pulmonary effort is normal. No respiratory distress.     Breath sounds: Normal breath sounds. No wheezing, rhonchi or rales.  Musculoskeletal:     Cervical back: Neck supple.     Right lower leg: No edema.     Left lower leg: No edema.  Lymphadenopathy:     Cervical: No cervical adenopathy.  Skin:    General: Skin is warm.     Findings: No rash.  Neurological:     Mental  Status: He is alert and oriented to person, place, and time. Mental status is at baseline.  Psychiatric:        Mood and Affect: Mood normal.        Behavior: Behavior normal.        Thought Content: Thought content normal.        Judgment: Judgment normal.    Results for orders placed or performed in visit on 06/24/23 (from the past 72 hours)  POCT HgB A1C     Status: Abnormal   Collection Time: 06/24/23  1:35 PM  Result Value Ref Range   Hemoglobin A1C 5.7 (A) 4.0 - 5.6 %   HbA1c POC (<> result, manual entry) 5.7 4.0 - 5.6 %   HbA1c, POC (prediabetic range) 5.7 5.7 - 6.4 %   HbA1c, POC (controlled diabetic range) 5.7 0.0 - 7.0 %      Assessment/Plan: [name] is a 89 y.o. male present for OV for chronic condition management Essential hypertension/Paroxysmal atrial fibrillation (HCC)//HLD//CHF/CAD/PVD stable Continue amlodipine 5 mg daily Continue coreg 6.25 mg twice daily. Continue Eliquis>cardio Continue Lasix 40 mg twice daily> new dosage since the visit, now twice daily in place of daily. Continue Potassium to K-Dur 20 mg twice daily as long as Lasix remains twice daily. - continue  fish oil supplementation - Low-sodium diet.  -  He BP has been  very difficult to control at times and his hydration and IDA/epo injections cause fluctuations leading to both highs and lower than desired readings. Certainly would rather mildly elevated BP over too low. Continue Dr. Nishan-cardiology follow-ups  Anemias IDA and  EPO:  - managed by heme/onc.   Prediabetes Patient has been very well diet controlled 5.7> 6.4> 6.5>5.9>6.5> 6.0> 5.8 >6.0>5.7 A1c collected today  Continue dietary modifications and exercise. If A1c increases and GFR maintains above 25 consider adding Farxiga for diabetes, renal and cardiac protection with heart failure and chronic kidney disease diagnoses.  Gout, unspecified cause, unspecified chronicity, unspecified site Stable Continue allopurinol 100  mg Continue renally dosed allopurinol Uric acid levels UTD  Hematuria/BPH/bladder cancer: Patient reports his bladder cancer  responded well to treatment.  He is prescribed Flomax and Proscar by urology.  CKD 4: GFR baseline now ~21-established with nephrology-Dr. Verna Czech Avoid NSAIDs Renally dose medications PTH/calcium and vitamin D UTD 12/2022  Meds managed by primary team: [facility]: Lasix, potassium, Bentyl [facility]: Amlodipine, Coreg, Lipitor, allopurinol  Orders Placed This Encounter  Procedures   POCT HgB A1C   Meds ordered this encounter  Medications   atorvastatin (LIPITOR) 20 MG tablet    Sig: Take 1 tablet (20 mg total) by mouth at bedtime.    Dispense:  90 tablet    Refill:  3   carvedilol (COREG) 6.25 MG tablet    Sig: Take 1 tablet (6.25 mg total) by mouth 2 (two) times daily with a meal.    Dispense:  180 tablet    Refill:  1   Potassium Chloride ER 20 MEQ TBCR    Sig: Take 1 tablet (20 mEq total) by mouth 2 (two) times daily.    Dispense:  180 tablet    Refill:  1   dicyclomine (BENTYL) 10 MG capsule    Sig: Take 1 capsule (10 mg total) by mouth every 6 (six) hours as needed.    Dispense:  120 capsule    Refill:  3   amLODipine (NORVASC) 5 MG tablet    Sig: Take 1 tablet (5 mg total) by mouth daily.    Dispense:  90 tablet    Refill:  1    Referral Orders  No referral(s) requested today     electronically signed by:  Felix Pacini, DO  [facility] - [place]

## 2023-06-24 NOTE — Patient Instructions (Addendum)

## 2023-07-03 DIAGNOSIS — H35033 Hypertensive retinopathy, bilateral: Secondary | ICD-10-CM | POA: Diagnosis not present

## 2023-07-03 DIAGNOSIS — H43813 Vitreous degeneration, bilateral: Secondary | ICD-10-CM | POA: Diagnosis not present

## 2023-07-03 DIAGNOSIS — H35373 Puckering of macula, bilateral: Secondary | ICD-10-CM | POA: Diagnosis not present

## 2023-07-03 DIAGNOSIS — Z961 Presence of intraocular lens: Secondary | ICD-10-CM | POA: Diagnosis not present

## 2023-07-03 DIAGNOSIS — H353134 Nonexudative age-related macular degeneration, bilateral, advanced atrophic with subfoveal involvement: Secondary | ICD-10-CM | POA: Diagnosis not present

## 2023-07-03 DIAGNOSIS — H401132 Primary open-angle glaucoma, bilateral, moderate stage: Secondary | ICD-10-CM | POA: Diagnosis not present

## 2023-07-08 DIAGNOSIS — Z8551 Personal history of malignant neoplasm of bladder: Secondary | ICD-10-CM | POA: Diagnosis not present

## 2023-07-08 DIAGNOSIS — N410 Acute prostatitis: Secondary | ICD-10-CM | POA: Diagnosis not present

## 2023-07-08 DIAGNOSIS — N390 Urinary tract infection, site not specified: Secondary | ICD-10-CM | POA: Diagnosis not present

## 2023-07-08 DIAGNOSIS — D414 Neoplasm of uncertain behavior of bladder: Secondary | ICD-10-CM | POA: Diagnosis not present

## 2023-07-13 ENCOUNTER — Inpatient Hospital Stay

## 2023-07-13 ENCOUNTER — Inpatient Hospital Stay (HOSPITAL_BASED_OUTPATIENT_CLINIC_OR_DEPARTMENT_OTHER): Admitting: Family

## 2023-07-13 ENCOUNTER — Inpatient Hospital Stay: Attending: Hematology & Oncology

## 2023-07-13 ENCOUNTER — Encounter: Payer: Self-pay | Admitting: Family

## 2023-07-13 VITALS — BP 128/45 | HR 62 | Temp 97.6°F | Resp 18 | Ht 67.0 in | Wt 141.0 lb

## 2023-07-13 DIAGNOSIS — D631 Anemia in chronic kidney disease: Secondary | ICD-10-CM | POA: Insufficient documentation

## 2023-07-13 DIAGNOSIS — N184 Chronic kidney disease, stage 4 (severe): Secondary | ICD-10-CM | POA: Diagnosis not present

## 2023-07-13 DIAGNOSIS — C673 Malignant neoplasm of anterior wall of bladder: Secondary | ICD-10-CM

## 2023-07-13 DIAGNOSIS — N1831 Chronic kidney disease, stage 3a: Secondary | ICD-10-CM | POA: Diagnosis not present

## 2023-07-13 DIAGNOSIS — I482 Chronic atrial fibrillation, unspecified: Secondary | ICD-10-CM | POA: Diagnosis not present

## 2023-07-13 DIAGNOSIS — D500 Iron deficiency anemia secondary to blood loss (chronic): Secondary | ICD-10-CM

## 2023-07-13 DIAGNOSIS — Z7901 Long term (current) use of anticoagulants: Secondary | ICD-10-CM | POA: Diagnosis not present

## 2023-07-13 DIAGNOSIS — D509 Iron deficiency anemia, unspecified: Secondary | ICD-10-CM | POA: Diagnosis not present

## 2023-07-13 DIAGNOSIS — Z79899 Other long term (current) drug therapy: Secondary | ICD-10-CM | POA: Insufficient documentation

## 2023-07-13 LAB — CBC WITH DIFFERENTIAL (CANCER CENTER ONLY)
Abs Immature Granulocytes: 0.03 10*3/uL (ref 0.00–0.07)
Basophils Absolute: 0 10*3/uL (ref 0.0–0.1)
Basophils Relative: 0 %
Eosinophils Absolute: 0.1 10*3/uL (ref 0.0–0.5)
Eosinophils Relative: 1 %
HCT: 35.3 % — ABNORMAL LOW (ref 39.0–52.0)
Hemoglobin: 11.5 g/dL — ABNORMAL LOW (ref 13.0–17.0)
Immature Granulocytes: 0 %
Lymphocytes Relative: 21 %
Lymphs Abs: 1.8 10*3/uL (ref 0.7–4.0)
MCH: 31.1 pg (ref 26.0–34.0)
MCHC: 32.6 g/dL (ref 30.0–36.0)
MCV: 95.4 fL (ref 80.0–100.0)
Monocytes Absolute: 0.7 10*3/uL (ref 0.1–1.0)
Monocytes Relative: 8 %
Neutro Abs: 5.8 10*3/uL (ref 1.7–7.7)
Neutrophils Relative %: 70 %
Platelet Count: 116 10*3/uL — ABNORMAL LOW (ref 150–400)
RBC: 3.7 MIL/uL — ABNORMAL LOW (ref 4.22–5.81)
RDW: 16.4 % — ABNORMAL HIGH (ref 11.5–15.5)
WBC Count: 8.4 10*3/uL (ref 4.0–10.5)
nRBC: 0 % (ref 0.0–0.2)

## 2023-07-13 LAB — CMP (CANCER CENTER ONLY)
ALT: 38 U/L (ref 0–44)
AST: 38 U/L (ref 15–41)
Albumin: 3.9 g/dL (ref 3.5–5.0)
Alkaline Phosphatase: 128 U/L — ABNORMAL HIGH (ref 38–126)
Anion gap: 11 (ref 5–15)
BUN: 58 mg/dL — ABNORMAL HIGH (ref 8–23)
CO2: 23 mmol/L (ref 22–32)
Calcium: 9 mg/dL (ref 8.9–10.3)
Chloride: 102 mmol/L (ref 98–111)
Creatinine: 2.5 mg/dL — ABNORMAL HIGH (ref 0.61–1.24)
GFR, Estimated: 24 mL/min — ABNORMAL LOW (ref >60)
Glucose, Bld: 141 mg/dL — ABNORMAL HIGH (ref 70–99)
Potassium: 4.3 mmol/L (ref 3.5–5.1)
Sodium: 136 mmol/L (ref 135–145)
Total Bilirubin: 0.5 mg/dL (ref 0.0–1.2)
Total Protein: 7.9 g/dL (ref 6.5–8.1)

## 2023-07-13 LAB — IRON AND IRON BINDING CAPACITY (CC-WL,HP ONLY)
Iron: 85 ug/dL (ref 45–182)
Saturation Ratios: 32 % (ref 17.9–39.5)
TIBC: 263 ug/dL (ref 250–450)
UIBC: 178 ug/dL (ref 117–376)

## 2023-07-13 LAB — RETICULOCYTES
Immature Retic Fract: 10.4 % (ref 2.3–15.9)
RBC.: 3.68 MIL/uL — ABNORMAL LOW (ref 4.22–5.81)
Retic Count, Absolute: 35.7 10*3/uL (ref 19.0–186.0)
Retic Ct Pct: 1 % (ref 0.4–3.1)

## 2023-07-13 LAB — FERRITIN: Ferritin: 477 ng/mL — ABNORMAL HIGH (ref 24–336)

## 2023-07-13 NOTE — Progress Notes (Signed)
 Hematology and Oncology Follow Up Visit  [name] [id number] 29-Sep-1928 89 y.o. 07/13/2023   Principle Diagnosis:  Erythropoietin deficiency anemia Iron deficiency anemia Chronic atrial fibrillation   Current Therapy:        IV iron as indicated Aranesp 300 g sq prn hemoglobin < 11  Eliquis 2.5 mg PO twice daily   Interim History:  [name] is here today with his wife and daughter for follow-up. He is having some fatigue.  Hgb is stable at 11.5! He has not noted any blood loss on Eliquis. No bruising or petechiae.  He states that he was recently seen by [facility] and was noted to have some "abnormal cells" within the bladder. He is scheduled for CT scan coming up. He will let us know what they find and any new treatment plan.  No fever, chills, n/v, cough, rash, dizziness, SOB, chest pain, palpitations, abdominal pain or changes in bowel or bladder habits.  He states that he does take an antidiarrhea as needed.  No swelling or tingling in his extremities at this time.  No falls or syncope.  Appetite and hydration are good. Weight is stable at 141 lbs.   ECOG Performance Status: 1 - Symptomatic but completely ambulatory  Medications:  Allergies as of 07/13/2023       Reactions   Lisinopril Other (See Comments)    he has difficulty controlling potassium on ace/arb- lisinopril was dc'd in the past 2/2 to hyperkalemia documented by [facility] PCP 01/2021   Penicillins Hives   Has patient had a PCN reaction causing immediate rash, facial/tongue/throat swelling, SOB or lightheadedness with hypotension: No Has patient had a PCN reaction causing severe rash involving mucus membranes or skin necrosis: Yes Has patient had a PCN reaction that required hospitalization: No Has patient had a PCN reaction occurring within the last 10 years: No If all of the above answers are "NO", then may proceed with Cephalosporin use.   Vancomycin Other (See Comments)   Edema and myalgia         Medication List        Accurate as of July 13, 2023  2:03 PM. If you have any questions, ask your nurse or doctor.          allopurinol 100 MG tablet Commonly known as: ZYLOPRIM Take 1 tablet (100 mg total) by mouth daily.   amLODipine 5 MG tablet Commonly known as: NORVASC Take 1 tablet (5 mg total) by mouth daily.   atorvastatin 20 MG tablet Commonly known as: LIPITOR Take 1 tablet (20 mg total) by mouth at bedtime.   carvedilol 6.25 MG tablet Commonly known as: COREG Take 1 tablet (6.25 mg total) by mouth 2 (two) times daily with a meal.   Cholecalciferol 125 MCG (5000 UT) capsule Take 5,000 Units by mouth daily.   Darbepoetin Alfa 300 MCG/0.6ML Sosy injection Commonly known as: ARANESP Inject 300 mcg into the skin once.   dicyclomine 10 MG capsule Commonly known as: BENTYL Take 1 capsule (10 mg total) by mouth every 6 (six) hours as needed.   Eliquis 2.5 MG Tabs tablet Generic drug: apixaban TAKE 1 TABLET BY MOUTH TWICE A DAY   Feraheme 510 MG/17ML Soln injection Generic drug: ferumoxytol Inject 510 mg into the vein once.   finasteride 5 MG tablet Commonly known as: PROSCAR Take 5 mg by mouth daily.   fish oil-omega-3 fatty acids 1000 MG capsule Take 1 g by mouth daily.   fluticasone 50 MCG/ACT nasal  spray Commonly known as: FLONASE Place 2 sprays into both nostrils daily.   furosemide 40 MG tablet Commonly known as: LASIX Take 1 tablet (40 mg total) by mouth 2 (two) times daily. What changed:  when to take this additional instructions   latanoprost 0.005 % ophthalmic solution Commonly known as: XALATAN Place 1 drop into both eyes at bedtime.   onetouch ultrasoft lancets Use to check sugar daily as instructed.   OneTouch Verio test strip Generic drug: glucose blood Use to check sugar daily as instructed.   Potassium Chloride ER 20 MEQ Tbcr Take 1 tablet (20 mEq total) by mouth 2 (two) times daily.   PRESERVISION AREDS  PO Take 1 tablet by mouth 2 (two) times daily.   PROBIOTIC PO Take 1 tablet by mouth daily.   tamsulosin 0.4 MG Caps capsule Commonly known as: FLOMAX Take 0.8 mg by mouth daily after supper.   timolol 0.5 % ophthalmic solution Commonly known as: TIMOPTIC Place 1 drop into both eyes 2 (two) times daily.   UNABLE TO FIND Take 2 tablets by mouth every morning. Med Name: [name] (takes place of Co Q 10)        Allergies:  Allergies  Allergen Reactions   Lisinopril Other (See Comments)     he has difficulty controlling potassium on ace/arb- lisinopril was dc'd in the past 2/2 to hyperkalemia documented by [facility] P[facility] 01/2021   Penicillins Hives    Has patient had a PCN reaction causing immediate rash, facial/tongue/throat swelling, SOB or lightheadedness with hypotension: No Has patient had a PCN reaction causing severe rash involving mucus membranes or skin necrosis: Yes Has patient had a PCN reaction that required hospitalization: No Has patient had a PCN reaction occurring within the last 10 years: No If all of the above answers are "NO", then may proceed with Cephalosporin use.    Vancomycin Other (See Comments)    Edema and myalgia    Past Medical History, Surgical history, Social history, and Family History were reviewed and updated.  Review of Systems: All other 10 point review of systems is negative.   Physical Exam:  height is 5\' 7"  (1.702 m) and weight is 141 lb (64 kg). His oral temperature is 97.6 F (36.4 C). His blood pressure is 128/45 (abnormal) and his pulse is 62. His respiration is 18 and oxygen saturation is 100%.   Wt Readings from Last 3 Encounters:  07/13/23 141 lb (64 kg)  06/24/23 140 lb 12.8 oz (63.9 kg)  05/27/23 147 lb 12.8 oz (67 kg)    Ocular: Sclerae unicteric, pupils equal, round and reactive to light Ear-nose-throat: Oropharynx clear, dentition fair Lymphatic: No cervical or supraclavicular adenopathy Lungs no rales or rhonchi, good  excursion bilaterally Heart regular rate and rhythm, no murmur appreciated Abd soft, nontender, positive bowel sounds MSK no focal spinal tenderness, no joint edema Neuro: non-focal, well-oriented, appropriate affect Breasts: Deferred   Lab Results  Component Value Date   WBC 8.4 07/13/2023   HGB 11.5 (L) 07/13/2023   HCT 35.3 (L) 07/13/2023   MCV 95.4 07/13/2023   PLT 116 (L) 07/13/2023   Lab Results  Component Value Date   FERRITIN 507 (H) 05/27/2023   IRON 79 05/27/2023   TIBC 286 05/27/2023   UIBC 207 05/27/2023   IRONPCTSAT 28 05/27/2023   Lab Results  Component Value Date   RETICCTPCT 1.0 07/13/2023   RBC 3.68 (L) 07/13/2023   No results found for: "KPAFRELGTCHN", "LAMBDASER", "KAPLAMBRATIO" No results  found for: "IGGSERUM", "IGA", "IGMSERUM" Lab Results  Component Value Date   TOTALPROTELP 6.0 (L) 12/26/2015   ALBUMINELP 3.4 (L) 12/26/2015   A1GS 0.4 (H) 12/26/2015   A2GS 0.8 12/26/2015   BETS 0.4 12/26/2015   BETA2SER 0.3 12/26/2015   GAMS 0.8 12/26/2015   MSPIKE Not Observed 01/29/2016   SPEI SEE NOTE 12/26/2015     Chemistry      Component Value Date/Time   NA 139 05/29/2023 0000   NA 145 04/09/2017 0905   NA 140 03/28/2016 0853   K 4.1 05/29/2023 0000   K 4.5 04/09/2017 0905   K 4.4 03/28/2016 0853   CL 100 05/29/2023 0000   CL 105 04/09/2017 0905   CO2 21 05/29/2023 0000   CO2 27 04/09/2017 0905   CO2 19 (L) 03/28/2016 0853   BUN 62 (A) 05/29/2023 0000   BUN 33 (H) 04/09/2017 0905   BUN 34.4 (H) 03/28/2016 0853   CREATININE 2.3 (A) 05/29/2023 0000   CREATININE 2.60 (H) 05/27/2023 1314   CREATININE 2.35 (H) 01/23/2023 1430   CREATININE 1.7 (H) 03/28/2016 0853   GLU 128 05/29/2023 0000      Component Value Date/Time   CALCIUM 8.2 (A) 05/29/2023 0000   CALCIUM 9.2 04/09/2017 0905   CALCIUM 9.2 03/28/2016 0853   ALKPHOS 96 05/27/2023 1314   ALKPHOS 69 04/09/2017 0905   ALKPHOS 85 03/28/2016 0853   AST 49 (H) 05/27/2023 1314   AST 23  03/28/2016 0853   ALT 36 05/27/2023 1314   ALT 27 04/09/2017 0905   ALT 37 03/28/2016 0853   BILITOT 0.6 05/27/2023 1314   BILITOT 0.79 03/28/2016 0853       Impression and Plan: [name] is a very pleasant 89 yo caucasian gentleman with both erythropoietin deficiency and iron deficiency anemia.  No ESA needed this visit. Hgb 11.5.  Iron studies are pending.  They will let us know any new developments with urology post scan.  Follow-up in 1 months.   Eileen Stanford, NP 3/17/20252:03 PM

## 2023-07-14 ENCOUNTER — Encounter: Payer: Self-pay | Admitting: *Deleted

## 2023-07-21 ENCOUNTER — Other Ambulatory Visit: Payer: Self-pay | Admitting: Urology

## 2023-07-21 ENCOUNTER — Telehealth: Payer: Self-pay | Admitting: Cardiovascular Disease

## 2023-07-21 NOTE — Telephone Encounter (Signed)
   Pre-operative Risk Assessment    Patient Name: [name]  DOB: 11-13-1928 MRN: [id number]      Request for Surgical Clearance    Procedure:   Resection of bladder tumor  Date of Surgery:  Clearance 08/18/23                                 Surgeon:  Vilma Prader Surgeon's Group or Practice Name:  [facility] Phone number:  [phone] Fax number:  [phone]   Type of Clearance Requested:   - Medical  - Pharmacy:  Hold Apixaban (Eliquis) 2day   Type of Anesthesia:  General    Additional requests/questions:   none  Signed, April L Harrington   07/21/2023, 2:25 PM

## 2023-07-21 NOTE — Telephone Encounter (Signed)
 Please advise holding Eliquis prior to Bladder tumor resection on 4/22.  Thank you!  DW

## 2023-07-22 ENCOUNTER — Telehealth: Payer: Self-pay | Admitting: *Deleted

## 2023-07-22 NOTE — Telephone Encounter (Signed)
 S/w pt is set up for pre op tele appt on 4/11 will send to [facility] and remove from basket.     Patient Consent for Virtual Visit         [name] has provided verbal consent on 07/22/2023 for a virtual visit (video or telephone).   CONSENT FOR VIRTUAL VISIT FOR:  [name]  By participating in this virtual visit I agree to the following:  I hereby voluntarily request, consent and authorize [facility] and its employed or contracted physicians, physician assistants, nurse practitioners or other licensed health care professionals (the Practitioner), to provide me with telemedicine health care services (the "Services") as deemed necessary by the treating Practitioner. I acknowledge and consent to receive the Services by the Practitioner via telemedicine. I understand that the telemedicine visit will involve communicating with the Practitioner through live audiovisual communication technology and the disclosure of certain medical information by electronic transmission. I acknowledge that I have been given the opportunity to request an in-person assessment or other available alternative prior to the telemedicine visit and am voluntarily participating in the telemedicine visit.  I understand that I have the right to withhold or withdraw my consent to the use of telemedicine in the course of my care at any time, without affecting my right to future care or treatment, and that the Practitioner or I may terminate the telemedicine visit at any time. I understand that I have the right to inspect all information obtained and/or recorded in the course of the telemedicine visit and may receive copies of available information for a reasonable fee.  I understand that some of the potential risks of receiving the Services via telemedicine include:  Delay or interruption in medical evaluation due to technological equipment failure or disruption; Information transmitted may not be  sufficient (e.g. poor resolution of images) to allow for appropriate medical decision making by the Practitioner; and/or  In rare instances, security protocols could fail, causing a breach of personal health information.  Furthermore, I acknowledge that it is my responsibility to provide information about my medical history, conditions and care that is complete and accurate to the best of my ability. I acknowledge that Practitioner's advice, recommendations, and/or decision may be based on factors not within their control, such as incomplete or inaccurate data provided by me or distortions of diagnostic images or specimens that may result from electronic transmissions. I understand that the practice of medicine is not an exact science and that Practitioner makes no warranties or guarantees regarding treatment outcomes. I acknowledge that a copy of this consent can be made available to me via my patient portal [facility] MyChart), or I can request a printed copy by calling the office of [facility].    I understand that my insurance will be billed for this visit.   I have read or had this consent read to me. I understand the contents of this consent, which adequately explains the benefits and risks of the Services being provided via telemedicine.  I have been provided ample opportunity to ask questions regarding this consent and the Services and have had my questions answered to my satisfaction. I give my informed consent for the services to be provided through the use of telemedicine in my medical care

## 2023-07-22 NOTE — Telephone Encounter (Signed)
  Patient Consent for Virtual Visit         [name] has provided verbal consent on 07/22/2023 for a virtual visit (video or telephone).   CONSENT FOR VIRTUAL VISIT FOR:  [name]  By participating in this virtual visit I agree to the following:  I hereby voluntarily request, consent and authorize [facility] and its employed or contracted physicians, physician assistants, nurse practitioners or other licensed health care professionals (the Practitioner), to provide me with telemedicine health care services (the "Services") as deemed necessary by the treating Practitioner. I acknowledge and consent to receive the Services by the Practitioner via telemedicine. I understand that the telemedicine visit will involve communicating with the Practitioner through live audiovisual communication technology and the disclosure of certain medical information by electronic transmission. I acknowledge that I have been given the opportunity to request an in-person assessment or other available alternative prior to the telemedicine visit and am voluntarily participating in the telemedicine visit.  I understand that I have the right to withhold or withdraw my consent to the use of telemedicine in the course of my care at any time, without affecting my right to future care or treatment, and that the Practitioner or I may terminate the telemedicine visit at any time. I understand that I have the right to inspect all information obtained and/or recorded in the course of the telemedicine visit and may receive copies of available information for a reasonable fee.  I understand that some of the potential risks of receiving the Services via telemedicine include:  Delay or interruption in medical evaluation due to technological equipment failure or disruption; Information transmitted may not be sufficient (e.g. poor resolution of images) to allow for appropriate medical decision making by the  Practitioner; and/or  In rare instances, security protocols could fail, causing a breach of personal health information.  Furthermore, I acknowledge that it is my responsibility to provide information about my medical history, conditions and care that is complete and accurate to the best of my ability. I acknowledge that Practitioner's advice, recommendations, and/or decision may be based on factors not within their control, such as incomplete or inaccurate data provided by me or distortions of diagnostic images or specimens that may result from electronic transmissions. I understand that the practice of medicine is not an exact science and that Practitioner makes no warranties or guarantees regarding treatment outcomes. I acknowledge that a copy of this consent can be made available to me via my patient portal [facility] MyChart), or I can request a printed copy by calling the office of [facility].    I understand that my insurance will be billed for this visit.   I have read or had this consent read to me. I understand the contents of this consent, which adequately explains the benefits and risks of the Services being provided via telemedicine.  I have been provided ample opportunity to ask questions regarding this consent and the Services and have had my questions answered to my satisfaction. I give my informed consent for the services to be provided through the use of telemedicine in my medical care

## 2023-07-22 NOTE — Telephone Encounter (Signed)
 Patient with diagnosis of Afib on Eliquis  Procedure:  Bladder tumor resection    Date of procedure: 04/22   CHA2DS2-VASc Score = 6   This indicates a 9.7% annual risk of stroke. The patient's score is based upon: CHF History: 1 HTN History: 1 Diabetes History: 1 Stroke History: 0 Vascular Disease History: 1 Age Score: 2 Gender Score: 0     CrCl 17 mL/min Platelet count 116 K    Per office protocol, patient can hold Eliquis for 2 days prior to procedure.     **This guidance is not considered finalized until pre-operative APP has relayed final recommendations.**

## 2023-07-22 NOTE — Telephone Encounter (Signed)
   Name: [name]  DOB: [birth year]  MRN: [id number]  Primary Cardiologist: Charlton Haws, MD   Preoperative team, please contact this patient and set up a phone call appointment for further preoperative risk assessment. Please obtain consent and complete medication review. Thank you for your help.  I confirm that guidance regarding antiplatelet and oral anticoagulation therapy has been completed and, if necessary, noted below.  Per office protocol, patient can hold Eliquis for 2 days prior to procedure.   I also confirmed the patient resides in the state of [place]. As per [name] Medical Board telemedicine laws, the patient must reside in the state in which the provider is licensed.   Ronney Asters, NP 07/22/2023, 1:08 PM [facility]

## 2023-07-27 DIAGNOSIS — K8020 Calculus of gallbladder without cholecystitis without obstruction: Secondary | ICD-10-CM | POA: Diagnosis not present

## 2023-07-27 DIAGNOSIS — D414 Neoplasm of uncertain behavior of bladder: Secondary | ICD-10-CM | POA: Diagnosis not present

## 2023-07-27 DIAGNOSIS — K5730 Diverticulosis of large intestine without perforation or abscess without bleeding: Secondary | ICD-10-CM | POA: Diagnosis not present

## 2023-07-28 ENCOUNTER — Ambulatory Visit (INDEPENDENT_AMBULATORY_CARE_PROVIDER_SITE_OTHER): Payer: Medicare Other

## 2023-07-28 DIAGNOSIS — I442 Atrioventricular block, complete: Secondary | ICD-10-CM

## 2023-07-28 LAB — CUP PACEART REMOTE DEVICE CHECK
Battery Remaining Longevity: 50 mo
Battery Remaining Percentage: 43 %
Battery Voltage: 2.98 V
Brady Statistic RV Percent Paced: 99 %
Date Time Interrogation Session: 20250401020014
Implantable Lead Connection Status: 753985
Implantable Lead Connection Status: 753985
Implantable Lead Implant Date: 20190402
Implantable Lead Implant Date: 20190402
Implantable Lead Location: 753859
Implantable Lead Location: 753860
Implantable Pulse Generator Implant Date: 20190402
Lead Channel Impedance Value: 450 Ohm
Lead Channel Pacing Threshold Amplitude: 0.5 V
Lead Channel Pacing Threshold Pulse Width: 0.5 ms
Lead Channel Sensing Intrinsic Amplitude: 12 mV
Lead Channel Setting Pacing Amplitude: 2.5 V
Lead Channel Setting Pacing Pulse Width: 0.5 ms
Lead Channel Setting Sensing Sensitivity: 2 mV
Pulse Gen Model: 2272
Pulse Gen Serial Number: 9003454

## 2023-08-01 ENCOUNTER — Encounter: Payer: Self-pay | Admitting: Cardiology

## 2023-08-05 ENCOUNTER — Other Ambulatory Visit: Payer: Self-pay | Admitting: Cardiovascular Disease

## 2023-08-05 ENCOUNTER — Encounter: Payer: Self-pay | Admitting: Cardiovascular Disease

## 2023-08-05 NOTE — Progress Notes (Signed)
 PERIOPERATIVE PRESCRIPTION FOR IMPLANTED CARDIAC DEVICE PROGRAMMING  Patient Information: Name:  [name]  DOB:  1929/02/24  MRN:  [id number]    Planned Procedure:  transurethral  resection of bladder tumor with chemo instillation  Surgeon:  Dr. Jennette Bill  Date of Procedure:  08/18/23  Cautery will be used.  Position during surgery:  unknown   Please send documentation back to:  [facility] (Fax # [phone])   Device Information:  Clinic EP Physician:  Dr. Jacinto Reap   Device Type:  Pacemaker Manufacturer and Phone #:  [employer]: [phone] Pacemaker Dependent?:  Yes.   Date of Last Device Check:  07/28/2023 Normal Device Function?:  Yes.    Electrophysiologist's Recommendations:  Have magnet available. Provide continuous ECG monitoring when magnet is used or reprogramming is to be performed.  Procedure may interfere with device function.  Magnet should be placed over device during procedure.  Per Device Clinic Standing Orders, Lenor Coffin, RN  9:09 PM 08/05/2023

## 2023-08-05 NOTE — Patient Instructions (Signed)
 SURGICAL WAITING ROOM VISITATION  Patients having surgery or a procedure may have no more than 2 support people in the waiting area - these visitors may rotate.    Children under the age of 3 must have an adult with them who is not the patient.  Due to an increase in RSV and influenza rates and associated hospitalizations, children ages 1 and under may not visit patients in [facility] hospitals.  Visitors with respiratory illnesses are discouraged from visiting and should remain at home.  If the patient needs to stay at the hospital during part of their recovery, the visitor guidelines for inpatient rooms apply. Pre-op nurse will coordinate an appropriate time for 1 support person to accompany patient in pre-op.  This support person may not rotate.    Please refer to the [facility] website for the visitor guidelines for Inpatients (after your surgery is over and you are in a regular room).    Your procedure is scheduled on: 08/18/23   Report to [facility] Main Entrance    Report to admitting at 12:30 PM   Call this number if you have problems the morning of surgery [phone]   Do not eat food or drink liquids :After Midnight.          If you have questions, please contact your surgeon's office.   FOLLOW BOWEL PREP AND ANY ADDITIONAL PRE OP INSTRUCTIONS YOU RECEIVED FROM YOUR SURGEON'S OFFICE!!!     Oral Hygiene is also important to reduce your risk of infection.                                    Remember - BRUSH YOUR TEETH THE MORNING OF SURGERY WITH YOUR REGULAR TOOTHPASTE  DENTURES WILL BE REMOVED PRIOR TO SURGERY PLEASE DO NOT APPLY "Poly grip" OR ADHESIVES!!!   Stop all vitamins and herbal supplements 7 days before surgery.   Take these medicines the morning of surgery with A SIP OF WATER: Allopurinol, Amlodipine, Carvedilol   These are anesthesia recommendations for holding your anticoagulants.  Please contact your prescribing physician to confirm IF it is  safe to hold your anticoagulants for this length of time.   Eliquis Apixaban   72 hours   Xarelto Rivaroxaban   72 hours  Plavix Clopidogrel   120 hours  Pletal Cilostazol   120 hours                                You may not have any metal on your body including jewelry, and body piercing             Do not wear lotions, powders, cologne, or deodorant              Men may shave face and neck.   Do not bring valuables to the hospital. [facility] IS NOT             RESPONSIBLE   FOR VALUABLES.   Contacts, glasses, dentures or bridgework may not be worn into surgery.  DO NOT BRING YOUR HOME MEDICATIONS TO THE HOSPITAL. PHARMACY WILL DISPENSE MEDICATIONS LISTED ON YOUR MEDICATION LIST TO YOU DURING YOUR ADMISSION IN THE HOSPITAL!    Patients discharged on the day of surgery will not be allowed to drive home.  Someone NEEDS to stay with you for the first 24 hours  after anesthesia.   Special Instructions: Bring a copy of your healthcare power of attorney and living will documents the day of surgery if you haven't scanned them before.              Please read over the following fact sheets you were given: IF YOU HAVE QUESTIONS ABOUT YOUR PRE-OP INSTRUCTIONS PLEASE CALL [phone] [place]ras    If you received a COVID test during your pre-op visit  it is requested that you wear a mask when out in public, stay away from anyone that may not be feeling well and notify your surgeon if you develop symptoms. If you test positive for Covid or have been in contact with anyone that has tested positive in the last 10 days please notify you surgeon.    [facility] - Preparing for Surgery Before surgery, you can play an important role.  Because skin is not sterile, your skin needs to be as free of germs as possible.  You can reduce the number of germs on your skin by washing with CHG (chlorahexidine gluconate) soap before surgery.  CHG is an antiseptic cleaner which kills germs and bonds with the skin  to continue killing germs even after washing. Please DO NOT use if you have an allergy to CHG or antibacterial soaps.  If your skin becomes reddened/irritated stop using the CHG and inform your nurse when you arrive at Short Stay. Do not shave (including legs and underarms) for at least 48 hours prior to the first CHG shower.  You may shave your face/neck.  Please follow these instructions carefully:  1.  Shower with CHG Soap the night before surgery and the  morning of surgery.  2.  If you choose to wash your hair, wash your hair first as usual with your normal  shampoo.  3.  After you shampoo, rinse your hair and body thoroughly to remove the shampoo.                             4.  Use CHG as you would any other liquid soap.  You can apply chg directly to the skin and wash.  Gently with a scrungie or clean washcloth.  5.  Apply the CHG Soap to your body ONLY FROM THE NECK DOWN.   Do   not use on face/ open                           Wound or open sores. Avoid contact with eyes, ears mouth and   genitals (private parts).                       Wash face,  Genitals (private parts) with your normal soap.             6.  Wash thoroughly, paying special attention to the area where your    surgery  will be performed.  7.  Thoroughly rinse your body with warm water from the neck down.  8.  DO NOT shower/wash with your normal soap after using and rinsing off the CHG Soap.                9.  Pat yourself dry with a clean towel.            10.  Wear clean pajamas.  11.  Place clean sheets on your bed the night of your first shower and do not  sleep with pets. Day of Surgery : Do not apply any lotions/deodorants the morning of surgery.  Please wear clean clothes to the hospital/surgery center.  FAILURE TO FOLLOW THESE INSTRUCTIONS MAY RESULT IN THE CANCELLATION OF YOUR SURGERY  PATIENT SIGNATURE_________________________________  NURSE  SIGNATURE__________________________________  ________________________________________________________________________

## 2023-08-05 NOTE — Progress Notes (Signed)
 COVID Vaccine Completed: yes  Date of COVID positive in last 90 days:  PCP - Felix Pacini, DO Cardiologist - Charlton Haws, MD Electrophysiologist- Steffanie Dunn, MD  Chest x-ray - 04/18/23 Epic EKG - 04/23/23 Epic Stress Test -  ECHO - 04/19/23 Epic Cardiac Cath - 2006 Pacemaker/ICD device last checked: 07/28/23 Epic- orders requested Spinal Cord Stimulator:  Bowel Prep -   Sleep Study -  CPAP -   Fasting Blood Sugar - preDM ? Checks Blood Sugar _____ times a day  Last dose of GLP1 agonist-  N/A GLP1 instructions:  Hold 7 days before surgery    Last dose of SGLT-2 inhibitors-  N/A SGLT-2 instructions:  Hold 3 days before surgery    Blood Thinner Instructions: Eliquis, hold 2 days? Aspirin Instructions: Last Dose:  Activity level:  Can go up a flight of stairs and perform activities of daily living without stopping and without symptoms of chest pain or shortness of breath.  Able to exercise without symptoms  Unable to go up a flight of stairs without symptoms of     Anesthesia review: HTN, a fib, CHF, CKD, anemia  Patient denies shortness of breath, fever, cough and chest pain at PAT appointment  Patient verbalized understanding of instructions that were given to them at the PAT appointment. Patient was also instructed that they will need to review over the PAT instructions again at home before surgery.

## 2023-08-06 ENCOUNTER — Encounter (HOSPITAL_COMMUNITY)
Admission: RE | Admit: 2023-08-06 | Discharge: 2023-08-06 | Disposition: A | Discharge status: Home / Self Care | Source: Ambulatory Visit | Attending: Urology | Admitting: Urology

## 2023-08-06 ENCOUNTER — Other Ambulatory Visit: Payer: Self-pay

## 2023-08-06 ENCOUNTER — Encounter (HOSPITAL_COMMUNITY): Payer: Self-pay

## 2023-08-06 VITALS — BP 125/44 | HR 60 | Temp 97.4°F | Resp 16 | Ht 67.0 in | Wt 141.1 lb

## 2023-08-06 DIAGNOSIS — I25708 Atherosclerosis of coronary artery bypass graft(s), unspecified, with other forms of angina pectoris: Secondary | ICD-10-CM | POA: Insufficient documentation

## 2023-08-06 DIAGNOSIS — Z01818 Encounter for other preprocedural examination: Secondary | ICD-10-CM | POA: Diagnosis not present

## 2023-08-06 HISTORY — DX: Family history of other specified conditions: Z84.89

## 2023-08-06 LAB — BASIC METABOLIC PANEL WITH GFR
Anion gap: 10 (ref 5–15)
BUN: 58 mg/dL — ABNORMAL HIGH (ref 8–23)
CO2: 21 mmol/L — ABNORMAL LOW (ref 22–32)
Calcium: 9.5 mg/dL (ref 8.9–10.3)
Chloride: 104 mmol/L (ref 98–111)
Creatinine, Ser: 2.12 mg/dL — ABNORMAL HIGH (ref 0.61–1.24)
GFR, Estimated: 28 mL/min — ABNORMAL LOW
Glucose, Bld: 161 mg/dL — ABNORMAL HIGH (ref 70–99)
Potassium: 4.2 mmol/L (ref 3.5–5.1)
Sodium: 135 mmol/L (ref 135–145)

## 2023-08-06 LAB — CBC
HCT: 31.3 % — ABNORMAL LOW (ref 39.0–52.0)
Hemoglobin: 10.3 g/dL — ABNORMAL LOW (ref 13.0–17.0)
MCH: 31.6 pg (ref 26.0–34.0)
MCHC: 32.9 g/dL (ref 30.0–36.0)
MCV: 96 fL (ref 80.0–100.0)
Platelets: 134 10*3/uL — ABNORMAL LOW (ref 150–400)
RBC: 3.26 MIL/uL — ABNORMAL LOW (ref 4.22–5.81)
RDW: 16.8 % — ABNORMAL HIGH (ref 11.5–15.5)
WBC: 6.8 10*3/uL (ref 4.0–10.5)
nRBC: 0 % (ref 0.0–0.2)

## 2023-08-06 NOTE — Progress Notes (Signed)
 Creatinine 2.12 results routed to Dr. Jennette Bill

## 2023-08-06 NOTE — Progress Notes (Unsigned)
 Virtual Visit via Telephone Note   Because of [name] co-morbid illnesses, he is at least at moderate risk for complications without adequate follow up.  This format is felt to be most appropriate for this patient at this time.  Due to technical limitations with video connection (technology), today's appointment will be conducted as an audio only telehealth visit, and [name] verbally agreed to proceed in this manner.   All issues noted in this document were discussed and addressed.  No physical exam could be performed with this format.  Evaluation Performed:  Preoperative cardiovascular risk assessment _____________   Date:  08/06/2023   Patient ID:  [name], DOB 14-Jul-1928, MRN [id number] Patient Location:  Home Provider location:   Office  Primary Care Provider:  Natalia Leatherwood, DO Primary Cardiologist:  Charlton Haws, MD  Chief Complaint / Patient Profile   89 y.o. y/o male with a h/o CABG in 1999 (LIMA to LAD, SVG to IM, sequential SVG to OM1, OM 2, SVG to D1, SVG to PDA), CHB status post [facility] Jude PPM in 2019, PAF, anemia on aranesp and iron infusions who is pending  trans urethral resection of bladder tumor (TURBT) and presents today for telephonic preoperative cardiovascular risk assessment.  History of Present Illness    [name] is a 89 y.o. male who presents via audio/video conferencing for a telehealth visit today.  Pt was last seen in cardiology clinic on 02/17/2023 by Dr. Eden Emms.  At that time [name] was doing well after previous admission for CHF exacerbation 08/2021. He was readmitted on 04/18/2023 for subsequent CHF exacerbation. He was diuresed with IV Lasix and discharged in stable condition. He contacted the office on 05/13/2023 with 7 pound weight gain and was advised to increase Lasix to 80 mg in the a.m. and 40 mg in the p.m. the patient is now pending procedure as outlined above. Since his last visit, he has been doing well  with fluid volume under better control with medication adjustment.  He denies any chest pain or increase shortness of breath with exertion.  He is able to complete greater than 4 METS of activity without difficulty.  He denies chest pain, shortness of breath, lower extremity edema, fatigue, palpitations, melena, hematuria, hemoptysis, diaphoresis, weakness, presyncope, syncope, orthopnea, and PND.   Past Medical History    Past Medical History:  Diagnosis Date   A-fib (HCC)    Basal cell carcinoma    skin   Bigeminal rhythm    Bradycardia 06/21/2017   Cancer of anterior wall of urinary bladder (HCC) 07/18/2019   Cataract    Chronic renal insufficiency, stage 3 (moderate) (HCC) 2018   GFR 30s-40s   Coronary artery disease    post bypass   CVD (cardiovascular disease)    Diabetes mellitus without complication (HCC)    type 2 diet controlled   Diverticular disease    Easy bruising    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   Family history of adverse reaction to anesthesia    daugther PONV   Gout    Hematuria 06/30/2019   Hernia    Hyperkalemia 05/06/2017   Hyperlipidemia    Hypertension    IBS (irritable bowel syndrome)    Iron deficiency anemia 02/04/2016   Macular degeneration    Microscopic colitis    PVD (peripheral vascular disease) (HCC)    Past Surgical History:  Procedure Laterality Date   CARDIAC CATHETERIZATION  2006   CARDIOVERSION N/A  02/22/2013   Procedure: CARDIOVERSION;  Surgeon: Wendall Stade, MD;  Location: [facility] ENDOSCOPY;  Service: Cardiovascular;  Laterality: N/A;   CARDIOVERSION N/A 11/15/2014   Procedure: CARDIOVERSION;  Surgeon: Wendall Stade, MD;  Location[facility] ENDOSCOPY;  Service: Cardiovascular;  Laterality: N/A;   CARDIOVERSION N/A 04/01/2017   Procedure: CARDIOVERSION;  Surgeon: Laurey Morale, MD;  Location: [facility] ENDOSCOPY;  Service: Cardiovascular;  Laterality: N/A;   CAROTID ENDARTERECTOMY  2009/ 1993   left/ right   COLONOSCOPY   03/17/2005   The colon is normal.   CORONARY ARTERY BYPASS GRAFT  1999   CYSTOSCOPY W/ RETROGRADES Bilateral 07/14/2019   Procedure: CYSTOSCOPY WITH RETROGRADE PYELOGRAM;  Surgeon: Marcine Matar, MD;  Location: [facility];  Service: Urology;  Laterality: Bilateral;   ESOPHAGOGASTRODUODENOSCOPY  03/17/2005   Normal esophagus. Normal Stomanch. Normal duodenum.    EYE SURGERY     eyelid repair   INGUINAL HERNIA REPAIR  02/11/2012   Procedure: LAPAROSCOPIC BILATERAL INGUINAL HERNIA REPAIR;  Surgeon: Valarie Merino, MD;  Location: [facility] ORS;  Service: General;  Laterality: Bilateral;   PACEMAKER IMPLANT N/A 07/28/2017   St Jude Medical Assurity MRI conditional  dual-chamber pacemaker for symptomatic second degree AV block by Dr Johney Frame   SKIN CANCER EXCISION     right ear x 3   TRANSURETHRAL RESECTION OF BLADDER TUMOR N/A 09/05/2019   Procedure: TRANSURETHRAL RESECTION OF BLADDER TUMOR (TURBT);  Surgeon: Marcine Matar, MD;  Location: [facility];  Service: Urology;  Laterality: N/A;   TRANSURETHRAL RESECTION OF BLADDER TUMOR WITH MITOMYCIN-C N/A 07/14/2019   Procedure: TRANSURETHRAL RESECTION OF BLADDER TUMOR WITH GEMCITABINE IN PACU;  Surgeon: Marcine Matar, MD;  Location: [facility];  Service: Urology;  Laterality: N/A;    Allergies  Allergies  Allergen Reactions   Lisinopril Other (See Comments)     he has difficulty controlling potassium on ace/arb- lisinopril was dc'd in the past 2/2 to hyperkalemia documented by [facility] PCP 01/2021   Penicillins Hives    Has patient had a PCN reaction causing immediate rash, facial/tongue/throat swelling, SOB or lightheadedness with hypotension: No Has patient had a PCN reaction causing severe rash involving mucus membranes or skin necrosis: Yes Has patient had a PCN reaction that required hospitalization: No Has patient had a PCN reaction occurring within the last 10 years: No If all of the  above answers are "NO", then may proceed with Cephalosporin use.    Vancomycin Other (See Comments)    Edema and myalgia    Home Medications    Prior to Admission medications   Medication Sig Start Date End Date Taking? Authorizing Provider  allopurinol (ZYLOPRIM) 100 MG tablet Take 1 tablet (100 mg total) by mouth daily. 01/08/23   Kuneff, Renee A, DO  amLODipine (NORVASC) 5 MG tablet Take 1 tablet (5 mg total) by mouth daily. 06/24/23   Kuneff, Renee A, DO  atorvastatin (LIPITOR) 20 MG tablet Take 1 tablet (20 mg total) by mouth at bedtime. 06/24/23   Kuneff, Renee A, DO  carvedilol (COREG) 6.25 MG tablet Take 1 tablet (6.25 mg total) by mouth 2 (two) times daily with a meal. 06/24/23   Kuneff, Renee A, DO  Cholecalciferol 125 MCG (5000 UT) capsule Take 5,000 Units by mouth daily.    [provider]  Darbepoetin Alfa (ARANESP) 300 MCG/0.6ML SOSY injection Inject 300 mcg into the skin once.    [provider]  dicyclomine (BENTYL) 10 MG capsule Take 1 capsule (  10 mg total) by mouth every 6 (six) hours as needed. 06/24/23   Kuneff, Renee A, DO  ELIQUIS 2.5 MG TABS tablet TAKE 1 TABLET BY MOUTH TWICE A DAY 06/16/23   Mealor, Roberts Gaudy, MD  ferumoxytol [facility]) 510 MG/17ML SOLN injection Inject 510 mg into the vein as needed.    [provider]  furosemide (LASIX) 40 MG tablet TAKE 1 TABLET IN THE MORNING AND TAKE ONE-HALF (1/2) TABLET IN THE EVENING 08/05/23   Wendall Stade, MD  glucose blood (ONETOUCH VERIO) test strip Use to check sugar daily as instructed. 09/25/22   Kuneff, Renee A, DO  Lancets (ONETOUCH ULTRASOFT) lancets Use to check sugar daily as instructed. 09/25/22   Kuneff, Renee A, DO  latanoprost (XALATAN) 0.005 % ophthalmic solution Place 1 drop into both eyes at bedtime. 07/14/18   [provider]  Multiple Vitamins-Minerals (PRESERVISION AREDS 2) CAPS Take 1 capsule by mouth 2 (two) times daily.    [provider]  OVER THE COUNTER  MEDICATION Take 1 tablet by mouth 2 (two) times daily. MitoQ    [provider]  Potassium Chloride ER 20 MEQ TBCR Take 1 tablet (20 mEq total) by mouth 2 (two) times daily. 06/24/23   Kuneff, Renee A, DO  tamsulosin (FLOMAX) 0.4 MG CAPS capsule Take 0.8 mg by mouth at bedtime.    [provider]  timolol (TIMOPTIC) 0.5 % ophthalmic solution Place 1 drop into both eyes 2 (two) times daily.  10/06/18   [provider]    Physical Exam    Vital Signs:  [name] does not have vital signs available for review today.120/44-following lasix   Given telephonic nature of communication, physical exam is limited. AAOx3. NAD. Normal affect.  Speech and respirations are unlabored.  Accessory Clinical Findings    None  Assessment & Plan    1.  Preoperative Cardiovascular Risk Assessment: -Patient's RCRI score is 11%  The patient affirms he has been doing well without any new cardiac symptoms. They are able to achieve 5 METS without cardiac limitations. Therefore, based on ACC/AHA guidelines, the patient would be at acceptable risk for the planned procedure without further cardiovascular testing. The patient was advised that if he develops new symptoms prior to surgery to contact our office to arrange for a follow-up visit, and he verbalized understanding.   The patient was advised that if he develops new symptoms prior to surgery to contact our office to arrange for a follow-up visit, and he verbalized understanding.  Per office protocol, patient can hold Eliquis for 2 days prior to procedure.    A copy of this note will be routed to requesting surgeon.  Time:   Today, I have spent 8 minutes with the patient with telehealth technology discussing medical history, symptoms, and management plan.      Napoleon Form, Leodis Rains, NP  08/06/2023, 6:20 PM

## 2023-08-07 ENCOUNTER — Other Ambulatory Visit: Payer: Self-pay | Admitting: Family Medicine

## 2023-08-07 ENCOUNTER — Ambulatory Visit: Attending: Cardiology

## 2023-08-07 DIAGNOSIS — Z01810 Encounter for preprocedural cardiovascular examination: Secondary | ICD-10-CM | POA: Diagnosis not present

## 2023-08-07 NOTE — Anesthesia Preprocedure Evaluation (Addendum)
 Anesthesia Evaluation  Patient identified by MRN, date of birth, ID band Patient awake    Reviewed: Allergy & Precautions, NPO status , Patient's Chart, lab work & pertinent test results, reviewed documented beta blocker date and time   History of Anesthesia Complications (+) Family history of anesthesia reaction  Airway Mallampati: III  TM Distance: >3 FB Neck ROM: Full    Dental  (+) Dental Advisory Given, Implants,    Pulmonary former smoker   Pulmonary exam normal breath sounds clear to auscultation       Cardiovascular hypertension, Pt. on medications and Pt. on home beta blockers + angina  + CAD, + CABG, + Peripheral Vascular Disease and +CHF  Normal cardiovascular exam+ dysrhythmias Atrial Fibrillation + pacemaker  Rhythm:Regular Rate:Normal     Neuro/Psych negative neurological ROS     GI/Hepatic negative GI ROS, Neg liver ROS,,,  Endo/Other  diabetes    Renal/GU Renal InsufficiencyRenal disease   Cancer of trigone of urinary bladder    Musculoskeletal negative musculoskeletal ROS (+)    Abdominal   Peds  Hematology  (+) Blood dyscrasia (Eliquis ), anemia   Anesthesia Other Findings Day of surgery medications reviewed with the patient.  Reproductive/Obstetrics                             Anesthesia Physical Anesthesia Plan  ASA: 4  Anesthesia Plan: General   Post-op Pain Management: Tylenol  PO (pre-op )*   Induction: Intravenous  PONV Risk Score and Plan: 3 and Dexamethasone  and Ondansetron   Airway Management Planned: Oral ETT  Additional Equipment:   Intra-op Plan:   Post-operative Plan: Extubation in OR  Informed Consent: I have reviewed the patients History and Physical, chart, labs and discussed the procedure including the risks, benefits and alternatives for the proposed anesthesia with the patient or authorized representative who has indicated his/her  understanding and acceptance.     Dental advisory given  Plan Discussed with: CRNA  Anesthesia Plan Comments: (Etomidate  for induction   PAT note by Rudy Costain, PA-C: 89 year old male follows with cardiology for history of PAF, anemia on Aranesp  and iron infusions, combined CHF, CHB s/p [employer] Jude PPM in 2019, CAD s/p CABG in 1999 (LIMA to LAD, SVG to IM, sequential SVG to OM1, OM 2, SVG to D1, SVG to PDA).  Echo 03/2023 showed EF 35 to 40%, RV systolic function mildly reduced, moderately elevated PASP 50 mmHg, moderate biatrial dilation, mild to moderate mitral regurgitation, moderate to severe tricuspid regurgitation.  Seen by Charles Connor, NP on 08/07/2023 for preop evaluation.  Per note, "-Patient's RCRI score is 11%. The patient affirms he has been doing well without any new cardiac symptoms. They are able to achieve 5 METS without cardiac limitations. Therefore, based on ACC/AHA guidelines, the patient would be at acceptable risk for the planned procedure without further cardiovascular testing. The patient was advised that if he develops new symptoms prior to surgery to contact our office to arrange for a follow-up visit, and he verbalized understanding. The patient was advised that if he develops new symptoms prior to surgery to contact our office to arrange for a follow-up visit, and he verbalized understanding. Per office protocol, patient can hold Eliquis  for 2 days prior to procedure."  Other pertinent history includes CKD 3/4, diet-controlled DM2 (A1c 5.7 on 06/24/2023), anemia treated with Aranesp  and iron infusions (followed by Dr. Maria Shiner).  Patient reports last dose of Eliquis  08/15/2023.  Preop labs reviewed,  creatinine elevated 2.12 consistent with history of CKD, stable chronic anemia hemoglobin 10.3, mild thrombocytopenia platelets 134, otherwise unremarkable.  EKG 04/18/2023: Ventricular paced rhythm.  Rate 60.  Perioperative prescription for implanted cardiac device  programming per progress note 08/05/23: Device Information:  Clinic EP Physician:  Dr. Tish Forge   Device Type:  Pacemaker Manufacturer and Phone #:  [employer] Jude/A[employer]ott: [phone] Pacemaker Dependent?:  Yes.   Date of Last Device Check:  07/28/2023     Normal Device Function?:  Yes.    Electrophysiologist's Recommendations:   Have magnet available.  Provide continuous ECG monitoring when magnet is used or reprogramming is to be performed.   Procedure may interfere with device function.  Magnet should be placed over device during procedure.  TTE 04/19/2023 (during admission for CHF exacerbation): 1. Left ventricular ejection fraction, by estimation, is 35 to 40%. The  left ventricle has moderately decreased function. The left ventricle  demonstrates regional wall motion abnormalities (see scoring  diagram/findings for description). The left  ventricular internal cavity size was moderately dilated. There is mild  left ventricular hypertrophy. Left ventricular diastolic parameters are  indeterminate.  2. Right ventricular systolic function is mildly reduced. The right  ventricular size is severely enlarged. There is moderately elevated  pulmonary artery systolic pressure. The estimated right ventricular  systolic pressure is 50.0 mmHg.  3. Left atrial size was moderately dilated.  4. Right atrial size was moderately dilated.  5. The mitral valve is degenerative. Mild to moderate mitral valve  regurgitation. No evidence of mitral stenosis.  6. Tricuspid valve regurgitation is moderate to severe.  7. The aortic valve is tricuspid. Aortic valve regurgitation is trivial.  Aortic valve sclerosis is present, with no evidence of aortic valve  stenosis.  8. The inferior vena cava is dilated in size with <50% respiratory  variability, suggesting right atrial pressure of 15 mmHg.   Comparison(s): Prior images reviewed side by side. Prior study not done  with contrast.  Contrast study shows LV dilation, apical hypokinesis, and  decrease LVEF. Valve disease appears to have somewhat improved.   )        Anesthesia Quick Evaluation

## 2023-08-07 NOTE — Telephone Encounter (Signed)
 Copied from CRM [id number]. Topic: Clinical - Medication Refill >> Aug 07, 2023  3:11 PM Aletta Edouard wrote: Most Recent Primary Care Visit:  Provider: Felix Pacini A  Department: [facility]  Visit Type: OFFICE VISIT  Date: 06/24/2023  Medication: amLODipine (NORVASC) 5 MG tablet   Has the patient contacted their pharmacy? Yes (Agent: If no, request that the patient contact the pharmacy for the refill. If patient does not wish to contact the pharmacy document the reason why and proceed with request.) (Agent: If yes, when and what did the pharmacy advise?)  Is this the correct pharmacy for this prescription? Yes If no, delete pharmacy and type the correct one.  This is the patient's preferred pharmacy:   [id number] - [place], [place] - [address] [place] [address] Phone: [phone] Fax: [phone]   Has the prescription been filled recently? No  Is the patient out of the medication? Yes  Has the patient been seen for an appointment in the last year OR does the patient have an upcoming appointment? Yes  Can we respond through MyChart? Yes  Agent: Please be advised that Rx refills may take up to 3 business days. We ask that you follow-up with your pharmacy.

## 2023-08-07 NOTE — Progress Notes (Signed)
 Anesthesia Chart Review:  89 year old male follows with cardiology for history of PAF, anemia on Aranesp and iron infusions, combined CHF, CHB s/p [employer] Jude PPM in 2019, CAD s/p CABG in 1999 (LIMA to LAD, SVG to IM, sequential SVG to OM1, OM 2, SVG to D1, SVG to PDA).  Echo 03/2023 showed EF 35 to 40%, RV systolic function mildly reduced, moderately elevated PASP 50 mmHg, moderate biatrial dilation, mild to moderate mitral regurgitation, moderate to severe tricuspid regurgitation.  Seen by Robin Searing, NP on 08/07/2023 for preop evaluation.  Per note, "-Patient's RCRI score is 11%. The patient affirms he has been doing well without any new cardiac symptoms. They are able to achieve 5 METS without cardiac limitations. Therefore, based on ACC/AHA guidelines, the patient would be at acceptable risk for the planned procedure without further cardiovascular testing. The patient was advised that if he develops new symptoms prior to surgery to contact our office to arrange for a follow-up visit, and he verbalized understanding. The patient was advised that if he develops new symptoms prior to surgery to contact our office to arrange for a follow-up visit, and he verbalized understanding. Per office protocol, patient can hold Eliquis for 2 days prior to procedure."  Other pertinent history includes CKD 3/4, diet-controlled DM2 (A1c 5.7 on 06/24/2023), anemia treated with Aranesp and iron infusions (followed by Dr. Myna Hidalgo).  Patient reports last dose of Eliquis 08/15/2023.  Preop labs reviewed, creatinine elevated 2.12 consistent with history of CKD, stable chronic anemia hemoglobin 10.3, mild thrombocytopenia platelets 134, otherwise unremarkable.  EKG 04/18/2023: Ventricular paced rhythm.  Rate 60.  Perioperative prescription for implanted cardiac device programming per progress note 08/05/23: Device Information:   Clinic EP Physician:  Dr. Jacinto Reap    Device Type:  Pacemaker Manufacturer and Phone #:   [employer]ott: [phone] Pacemaker Dependent?:  Yes.   Date of Last Device Check:  07/28/2023     Normal Device Function?:  Yes.     Electrophysiologist's Recommendations:   Have magnet available. Provide continuous ECG monitoring when magnet is used or reprogramming is to be performed.  Procedure may interfere with device function.  Magnet should be placed over device during procedure.  TTE 04/19/2023 (during admission for CHF exacerbation):  1. Left ventricular ejection fraction, by estimation, is 35 to 40%. The  left ventricle has moderately decreased function. The left ventricle  demonstrates regional wall motion abnormalities (see scoring  diagram/findings for description). The left  ventricular internal cavity size was moderately dilated. There is mild  left ventricular hypertrophy. Left ventricular diastolic parameters are  indeterminate.   2. Right ventricular systolic function is mildly reduced. The right  ventricular size is severely enlarged. There is moderately elevated  pulmonary artery systolic pressure. The estimated right ventricular  systolic pressure is 50.0 mmHg.   3. Left atrial size was moderately dilated.   4. Right atrial size was moderately dilated.   5. The mitral valve is degenerative. Mild to moderate mitral valve  regurgitation. No evidence of mitral stenosis.   6. Tricuspid valve regurgitation is moderate to severe.   7. The aortic valve is tricuspid. Aortic valve regurgitation is trivial.  Aortic valve sclerosis is present, with no evidence of aortic valve  stenosis.   8. The inferior vena cava is dilated in size with <50% respiratory  variability, suggesting right atrial pressure of 15 mmHg.   Comparison(s): Prior images reviewed side by side. Prior study not done  with contrast. Contrast study shows LV dilation,  apical hypokinesis, and  decrease LVEF. Valve disease appears to have somewhat improved.      Zannie Cove [facility] Short Stay  Center/Anesthesiology Phone [phone] 08/07/2023 12:42 PM

## 2023-08-12 DIAGNOSIS — H906 Mixed conductive and sensorineural hearing loss, bilateral: Secondary | ICD-10-CM | POA: Diagnosis not present

## 2023-08-14 ENCOUNTER — Encounter: Payer: Self-pay | Admitting: Family

## 2023-08-14 ENCOUNTER — Inpatient Hospital Stay (HOSPITAL_BASED_OUTPATIENT_CLINIC_OR_DEPARTMENT_OTHER): Admitting: Family

## 2023-08-14 ENCOUNTER — Inpatient Hospital Stay

## 2023-08-14 ENCOUNTER — Inpatient Hospital Stay: Attending: Hematology & Oncology

## 2023-08-14 VITALS — BP 131/48 | HR 59 | Temp 97.9°F | Resp 18 | Ht 67.0 in | Wt 141.2 lb

## 2023-08-14 DIAGNOSIS — C679 Malignant neoplasm of bladder, unspecified: Secondary | ICD-10-CM | POA: Insufficient documentation

## 2023-08-14 DIAGNOSIS — D500 Iron deficiency anemia secondary to blood loss (chronic): Secondary | ICD-10-CM | POA: Diagnosis not present

## 2023-08-14 DIAGNOSIS — N1831 Chronic kidney disease, stage 3a: Secondary | ICD-10-CM

## 2023-08-14 DIAGNOSIS — D509 Iron deficiency anemia, unspecified: Secondary | ICD-10-CM

## 2023-08-14 DIAGNOSIS — D508 Other iron deficiency anemias: Secondary | ICD-10-CM

## 2023-08-14 DIAGNOSIS — D631 Anemia in chronic kidney disease: Secondary | ICD-10-CM | POA: Insufficient documentation

## 2023-08-14 DIAGNOSIS — C673 Malignant neoplasm of anterior wall of bladder: Secondary | ICD-10-CM

## 2023-08-14 DIAGNOSIS — N184 Chronic kidney disease, stage 4 (severe): Secondary | ICD-10-CM | POA: Diagnosis not present

## 2023-08-14 DIAGNOSIS — D4120 Neoplasm of uncertain behavior of unspecified ureter: Secondary | ICD-10-CM | POA: Insufficient documentation

## 2023-08-14 LAB — CBC WITH DIFFERENTIAL (CANCER CENTER ONLY)
Abs Immature Granulocytes: 0.09 10*3/uL — ABNORMAL HIGH (ref 0.00–0.07)
Basophils Absolute: 0 10*3/uL (ref 0.0–0.1)
Basophils Relative: 0 %
Eosinophils Absolute: 0.2 10*3/uL (ref 0.0–0.5)
Eosinophils Relative: 2 %
HCT: 31.3 % — ABNORMAL LOW (ref 39.0–52.0)
Hemoglobin: 10.3 g/dL — ABNORMAL LOW (ref 13.0–17.0)
Immature Granulocytes: 1 %
Lymphocytes Relative: 26 %
Lymphs Abs: 2.1 10*3/uL (ref 0.7–4.0)
MCH: 31.3 pg (ref 26.0–34.0)
MCHC: 32.9 g/dL (ref 30.0–36.0)
MCV: 95.1 fL (ref 80.0–100.0)
Monocytes Absolute: 0.8 10*3/uL (ref 0.1–1.0)
Monocytes Relative: 10 %
Neutro Abs: 5 10*3/uL (ref 1.7–7.7)
Neutrophils Relative %: 61 %
Platelet Count: 118 10*3/uL — ABNORMAL LOW (ref 150–400)
RBC: 3.29 MIL/uL — ABNORMAL LOW (ref 4.22–5.81)
RDW: 16.5 % — ABNORMAL HIGH (ref 11.5–15.5)
WBC Count: 8.3 10*3/uL (ref 4.0–10.5)
nRBC: 0 % (ref 0.0–0.2)

## 2023-08-14 LAB — FERRITIN: Ferritin: 545 ng/mL — ABNORMAL HIGH (ref 24–336)

## 2023-08-14 LAB — RETICULOCYTES
Immature Retic Fract: 9.1 % (ref 2.3–15.9)
RBC.: 3.33 MIL/uL — ABNORMAL LOW (ref 4.22–5.81)
Retic Count, Absolute: 44.3 10*3/uL (ref 19.0–186.0)
Retic Ct Pct: 1.3 % (ref 0.4–3.1)

## 2023-08-14 LAB — CEA (ACCESS): CEA (CHCC): 6.39 ng/mL — ABNORMAL HIGH (ref 0.00–5.00)

## 2023-08-14 MED ORDER — DARBEPOETIN ALFA 300 MCG/0.6ML IJ SOSY
300.0000 ug | PREFILLED_SYRINGE | Freq: Once | INTRAMUSCULAR | Status: AC
  Administered 2023-08-14: 300 ug via SUBCUTANEOUS

## 2023-08-14 NOTE — Progress Notes (Signed)
 Hematology and Oncology Follow Up Visit  [name] [id number] 09/18/1928 89 y.o. 08/14/2023   Principle Diagnosis:  Erythropoietin  deficiency anemia Iron deficiency anemia Chronic atrial fibrillation  Bladder cancer - treated with urology Dr. ALONSO Pea   Current Therapy:        IV iron as indicated Aranesp  300 g sq prn hemoglobin < 11  Eliquis  2.5 mg PO twice daily   Interim History:  [name] is here today with his daughter for follow-up and injection. Hgb is stable at 10.3, MCV 95.  Mild fatigue at times.  He is doing his calisthenics at home for exercise.  He has not noted any blood loss. No bruising or petechiae.  He is scheduled to start TURBT with chemo on 08/18/2023.  No fever, chills, n/v, cough, rash, dizziness, SOB, chest pain, palpitations, abdominal pain or changes in bowel or bladder habits.  No swelling in his extremities.  Numbness in bottoms of his feet unchanged from baseline.  He ambulates with a cane for added support.  No falls or syncope reported.  Appetite and hydration are good. Weight is stable at 141 lbs.   ECOG Performance Status: 1 - Symptomatic but completely ambulatory  Medications:  Allergies as of 08/14/2023       Reactions   Lisinopril  Other (See Comments)    he has difficulty controlling potassium on ace/arb- lisinopril  was dc'd in the past 2/2 to hyperkalemia documented by [facility] 01/2021   Penicillins Hives   Has patient had a PCN reaction causing immediate rash, facial/tongue/throat swelling, SOB or lightheadedness with hypotension: No Has patient had a PCN reaction causing severe rash involving mucus membranes or skin necrosis: Yes Has patient had a PCN reaction that required hospitalization: No Has patient had a PCN reaction occurring within the last 10 years: No If all of the above answers are NO, then may proceed with Cephalosporin use.   Vancomycin  Other (See Comments)   Edema and myalgia        Medication  List        Accurate as of August 14, 2023  2:13 PM. If you have any questions, ask your nurse or doctor.          allopurinol  100 MG tablet Commonly known as: ZYLOPRIM  Take 1 tablet (100 mg total) by mouth daily.   amLODipine  5 MG tablet Commonly known as: NORVASC  Take 1 tablet (5 mg total) by mouth daily.   atorvastatin  20 MG tablet Commonly known as: LIPITOR Take 1 tablet (20 mg total) by mouth at bedtime.   carvedilol  6.25 MG tablet Commonly known as: COREG  Take 1 tablet (6.25 mg total) by mouth 2 (two) times daily with a meal.   Cholecalciferol  125 MCG (5000 UT) capsule Take 5,000 Units by mouth daily.   Darbepoetin Alfa  300 MCG/0.6ML Sosy injection Commonly known as: ARANESP  Inject 300 mcg into the skin once.   dicyclomine  10 MG capsule Commonly known as: BENTYL  Take 1 capsule (10 mg total) by mouth every 6 (six) hours as needed.   Eliquis  2.5 MG Tabs tablet Generic drug: apixaban  TAKE 1 TABLET BY MOUTH TWICE A DAY   Feraheme  510 MG/17ML Soln injection Generic drug: ferumoxytol  Inject 510 mg into the vein as needed.   furosemide  40 MG tablet Commonly known as: LASIX  TAKE 1 TABLET IN THE MORNING AND TAKE ONE-HALF (1/2) TABLET IN THE EVENING   latanoprost  0.005 % ophthalmic solution Commonly known as: XALATAN  Place 1 drop into both eyes at bedtime.  onetouch ultrasoft lancets Use to check sugar daily as instructed.   OneTouch Verio test strip Generic drug: glucose blood Use to check sugar daily as instructed.   OVER THE COUNTER MEDICATION Take 1 tablet by mouth 2 (two) times daily. MitoQ   Potassium Chloride  ER 20 MEQ Tbcr Take 1 tablet (20 mEq total) by mouth 2 (two) times daily.   PreserVision AREDS 2 Caps Take 1 capsule by mouth 2 (two) times daily.   tamsulosin  0.4 MG Caps capsule Commonly known as: FLOMAX  Take 0.8 mg by mouth at bedtime.   timolol  0.5 % ophthalmic solution Commonly known as: TIMOPTIC  Place 1 drop into both eyes 2  (two) times daily.        Allergies:  Allergies  Allergen Reactions   Lisinopril  Other (See Comments)     he has difficulty controlling potassium on ace/arb- lisinopril  was dc'd in the past 2/2 to hyperkalemia documented by [facility] 01/2021   Penicillins Hives    Has patient had a PCN reaction causing immediate rash, facial/tongue/throat swelling, SOB or lightheadedness with hypotension: No Has patient had a PCN reaction causing severe rash involving mucus membranes or skin necrosis: Yes Has patient had a PCN reaction that required hospitalization: No Has patient had a PCN reaction occurring within the last 10 years: No If all of the above answers are NO, then may proceed with Cephalosporin use.    Vancomycin  Other (See Comments)    Edema and myalgia    Past Medical History, Surgical history, Social history, and Family History were reviewed and updated.  Review of Systems: All other 10 point review of systems is negative.   Physical Exam:  vitals were not taken for this visit.   Wt Readings from Last 3 Encounters:  08/06/23 141 lb 1.5 oz (64 kg)  07/13/23 141 lb (64 kg)  06/24/23 140 lb 12.8 oz (63.9 kg)    Ocular: Sclerae unicteric, pupils equal, round and reactive to light Ear-nose-throat: Oropharynx clear, dentition fair Lymphatic: No cervical or supraclavicular adenopathy Lungs no rales or rhonchi, good excursion bilaterally Heart regular rate and rhythm, no murmur appreciated Abd soft, nontender, positive bowel sounds MSK no focal spinal tenderness, no joint edema Neuro: non-focal, well-oriented, appropriate affect Breasts: Deferred   Lab Results  Component Value Date   WBC 8.3 08/14/2023   HGB 10.3 (L) 08/14/2023   HCT 31.3 (L) 08/14/2023   MCV 95.1 08/14/2023   PLT 118 (L) 08/14/2023   Lab Results  Component Value Date   FERRITIN 477 (H) 07/13/2023   IRON 85 07/13/2023   TIBC 263 07/13/2023   UIBC 178 07/13/2023   IRONPCTSAT 32 07/13/2023    Lab Results  Component Value Date   RETICCTPCT 1.3 08/14/2023   RBC 3.29 (L) 08/14/2023   RBC 3.33 (L) 08/14/2023   No results found for: KPAFRELGTCHN, LAMBDASER, KAPLAMBRATIO No results found for: KIMBERLY LE, IGMSERUM Lab Results  Component Value Date   TOTALPROTELP 6.0 (L) 12/26/2015   ALBUMINELP 3.4 (L) 12/26/2015   A1GS 0.4 (H) 12/26/2015   A2GS 0.8 12/26/2015   BETS 0.4 12/26/2015   BETA2SER 0.3 12/26/2015   GAMS 0.8 12/26/2015   MSPIKE Not Observed 01/29/2016   SPEI SEE NOTE 12/26/2015     Chemistry      Component Value Date/Time   NA 135 08/06/2023 1351   NA 139 05/29/2023 0000   NA 145 04/09/2017 0905   NA 140 03/28/2016 0853   K 4.2 08/06/2023 1351   K 4.5  04/09/2017 0905   K 4.4 03/28/2016 0853   CL 104 08/06/2023 1351   CL 105 04/09/2017 0905   CO2 21 (L) 08/06/2023 1351   CO2 27 04/09/2017 0905   CO2 19 (L) 03/28/2016 0853   BUN 58 (H) 08/06/2023 1351   BUN 62 (A) 05/29/2023 0000   BUN 33 (H) 04/09/2017 0905   BUN 34.4 (H) 03/28/2016 0853   CREATININE 2.12 (H) 08/06/2023 1351   CREATININE 2.50 (H) 07/13/2023 1336   CREATININE 2.35 (H) 01/23/2023 1430   CREATININE 1.7 (H) 03/28/2016 0853   GLU 128 05/29/2023 0000      Component Value Date/Time   CALCIUM  9.5 08/06/2023 1351   CALCIUM  9.2 04/09/2017 0905   CALCIUM  9.2 03/28/2016 0853   ALKPHOS 128 (H) 07/13/2023 1336   ALKPHOS 69 04/09/2017 0905   ALKPHOS 85 03/28/2016 0853   AST 38 07/13/2023 1336   AST 23 03/28/2016 0853   ALT 38 07/13/2023 1336   ALT 27 04/09/2017 0905   ALT 37 03/28/2016 0853   BILITOT 0.5 07/13/2023 1336   BILITOT 0.79 03/28/2016 0853       Impression and Plan: Mr. Scarfo is a very pleasant 89 yo caucasian gentleman with both erythropoietin  deficiency and iron deficiency anemia.  ESA given, Hgb 10.3.  Iron studies are pending. We will replace if needed.  Follow-up in 1 months.   Lauraine Pepper, NP 4/18/20252:13 PM

## 2023-08-14 NOTE — Patient Instructions (Signed)
 [facility] - A DEPT OF [facility]  Discharge Instructions: Thank you for choosing [facility] to provide your oncology and hematology care.   If you have a lab appointment with the Cancer Center, please go directly to the Cancer Center and check in at the registration area.  Wear comfortable clothing and clothing appropriate for easy access to any Portacath or PICC line.   We strive to give you quality time with your provider. You may need to reschedule your appointment if you arrive late (15 or more minutes).  Arriving late affects you and other patients whose appointments are after yours.  Also, if you miss three or more appointments without notifying the office, you may be dismissed from the clinic at the provider's discretion.      For prescription refill requests, have your pharmacy contact our office and allow 72 hours for refills to be completed.    Today you received the following chemotherapy and/or immunotherapy agents Aranesp     To help prevent nausea and vomiting after your treatment, we encourage you to take your nausea medication as directed.  BELOW ARE SYMPTOMS THAT SHOULD BE REPORTED IMMEDIATELY: *FEVER GREATER THAN 100.4 F (38 C) OR HIGHER *CHILLS OR SWEATING *NAUSEA AND VOMITING THAT IS NOT CONTROLLED WITH YOUR NAUSEA MEDICATION *UNUSUAL SHORTNESS OF BREATH *UNUSUAL BRUISING OR BLEEDING *URINARY PROBLEMS (pain or burning when urinating, or frequent urination) *BOWEL PROBLEMS (unusual diarrhea, constipation, pain near the anus) TENDERNESS IN MOUTH AND THROAT WITH OR WITHOUT PRESENCE OF ULCERS (sore throat, sores in mouth, or a toothache) UNUSUAL RASH, SWELLING OR PAIN  UNUSUAL VAGINAL DISCHARGE OR ITCHING   Items with * indicate a potential emergency and should be followed up as soon as possible or go to the Emergency Department if any problems should occur.  Please show the CHEMOTHERAPY ALERT CARD or IMMUNOTHERAPY ALERT  CARD at check-in to the Emergency Department and triage nurse. Should you have questions after your visit or need to cancel or reschedule your appointment, please contact [facility] - A DEPT OF [facility]  [phone] and follow the prompts.  Office hours are 8:00 a.m. to 4:30 p.m. Monday - Friday. Please note that voicemails left after 4:00 p.m. may not be returned until the following business day.  We are closed weekends and major holidays. You have access to a nurse at all times for urgent questions. Please call the main number to the clinic [phone] and follow the prompts.  For any non-urgent questions, you may also contact your provider using MyChart. We now offer e-Visits for anyone 24 and older to request care online for non-urgent symptoms. For details visit [email or web address].   Also download the MyChart app! Go to the app store, search MyChart, open the app, select [facility], and log in with your MyChart username and password.

## 2023-08-15 NOTE — H&P (Signed)
 89 year old male with history of large bladder tumor at the 7:00 to 11 o'clock position of the bladder neck. This resection was March 2021, restaging resection May 2021. Restaging was negative for malignancy. Pathology was positive for high-grade nonmuscle invasive bladder cancer. He status post BCG induction and maintenance x 3 last cystoscopy was 1 year ago.   PMH: Patient had 7 bypasses (patient sees cardiologist D.r Stann Earnest with [facility]), no hx of stroke. Pt is on Eliquis . No hx of COPD, asthma, no OSA.  PSH: TURBT, no abdominal surgery, open heart bypass.   Bladder cancer:  07/08/2023: Cystoscopy today, cysot shows pappilary tumor at L trigone, L UP not visulized.  08/18/23: Plan for TURBT today     ALLERGIES: Penicillins - Hives    MEDICATIONS: Allopurinol  300 mg tablet 1/2 tablet PO Daily  Ultram 50 mg tablet 1 tablet PO Q 4 hours p.r.n. pain  Acetaminophen  500 mg tablet 1 tablet PO Q 6 H PRN  Aloe Vera 25 mg capsule 1 capsule PO BID  Amlodipine  Besylate 5 mg tablet 1 tablet PO Daily  Atorvastatin  Calcium  20 mg tablet 1 tablet PO Q HS  Carvedilol  6.25 mg tablet 1 tablet PO BID  Chromium 2 tablet PO Q PM  Dicyclomine  Hcl 10 mg capsule 1 capsule PO QID  Diphenoxylate -Atropine  2.5 mg-0.025 mg tablet 1 tablet PO TID PRN  Eliquis  2.5 mg tablet 1 tablet PO BID  Fish Oil 1,000 mg (120 mg-180 mg) capsule 1 capsule PO Daily  Furosemide  20 mg tablet 1 tablet PO Daily  Lasix  40 mg tablet  Potassium Chloride  10 meq capsule, extended release 1 capsule PO Daily  Preservision Areds 1 tablet PO BID  Vanadyl Sulfate 1 tablet PO Q PM  Vitamin D3 25 mcg (1,000 unit) capsule 1 capsule PO Daily  Zinc Gluconate 100 mg tablet 1/2 tablet PO Q HS     GU PSH: Bladder Instill AntiCA Agent - 2022, 2022, 2022, 2022, 2022, 2022, 2021, 2021, 2021, 2021, 2021, 2021, 2021, 2021, 2021 Cystoscopy - 07/04/2022, 09/27/2021, 02/27/2021, 2022, 2021, 2021, 2021 Cystoscopy TURBT >5 cm - 2021 Cystoscopy TURBT 2-5 cm -  2021       PSH Notes: cardiac cath/cardioversion   NON-GU PSH: CABG (coronary artery bypass grafting) Carotid Endarterectomy - 2013, 2013 Eye Surgery (Unspecified) Heart Surgery (Unspecified) Hernia Repair Pacemaker placement - 2019 Skin Biopsy     GU PMH: History of bladder cancer, History of high-grade nonmuscle invasive bladder cancer, status post induction/maintenance BCG with negative cystoscopy today - 07/04/2022, History of high-grade nonmuscle invasive bladder cancer, negative cystoscopy today, - 09/27/2021 Bladder Cancer overlapping sites, High-grade nonmuscle invasive bladder cancer, status post induction BCG +3 separate maintenance cycles. Cystoscopy negative today. - 02/27/2021, - 2022, - 2022, - 2022, cystoscopically things look fine today. He has had induction in 1 maintenance BCG and has done well with these, - 2022, - 2022, His gross hematuria has cleared. It might have been due to a mildly traumatic catheterization., - 2022, - 2022, - 2022, High-grade non muscle invasive bladder cancer, status post resection, induction and maintenance BCG. Cysto today was negative., - 2021, - 2021, - 2021, - 2021, No evidence of recurrence today., - 2021, Repeat TURBT 1 mo ago found no residual disease. BCG induction initiated today. , - 2021, TURBT was tolerated well. He does need repeat TURBT in the next few weeks but he was assured that this would be much easier than his first round. Following this, I think it would be appropriate  to set him up for BCG induction to reduce risk for recurrent disease. , - 2021, Newly diagnosis urothelial carcinoma at the bladder neck., - 2021 Gross hematuria (Stable), his gross hematuria was with catheterization. Probably exacerbated by Eliquis . That has cleared. - 2022, He has had no recurrences of gross hematuria in interval. RUS today clear. , - 2021, - 2021 BPH w/LUTS, No significant LUTS - 2021, Stable voiding pattern and sx's., - 2021 (Improving), He reports  very minimal urinary sx's with two tamsulosin  daily. Recommended lowering dose to once daily. U/A and physical exam clear., - 2020, - 2019 BPH w/o LUTS, Stable urinary pattern in interval. - 2021 Nocturia - 2020 Urinary Retention (Chronic), Will continue with daily Tamsulosin . Begin Finasteride  5 mg 1 po daily. Instructed it unable to void in 6 hrs RTC for PVR and possible foley replacement. If he fails TOV will proceed with UDS and cysto w/MD. - 2019 Acute prostatitis, Prostatitis, acute - 2014 Elevated PSA, Elevated prostate specific antigen (PSA) - 2014 Chronic kidney disease stage 3 (GFR 30-60)      PMH Notes: .   NON-GU PMH: Diabetes Type 2, Diabetes mellitus - 2014 Gout, Gout - 2014 Heart disease, unspecified, Heart disease - 2014 Personal history of other diseases of the circulatory system, History of hypertension - 2014 Personal history of other endocrine, nutritional and metabolic disease, History of hypercholesterolemia - 2014 Atrial Fibrillation Coronary Artery Disease Other specified heart block Peripheral vascular disease    FAMILY HISTORY: 1 - Son, Daughter CAD (coronary artery disease) - Father Father Deceased At Age72 ___ - Runs In Family melanoma - Sister Mother Deceased At Age 18 from diabetic complicati - Runs In Family stroke - Mother   SOCIAL HISTORY: Marital Status: Married Preferred Language: English; Ethnicity: Not Hispanic Or Latino; Race: White Current Smoking Status: Patient does not smoke anymore. Has not smoked since 07/28/1962.   Tobacco Use Assessment Completed: Used Tobacco in last 30 days? Does not use smokeless tobacco. Has never drank.  Does not use drugs. Does not drink caffeine.    REVIEW OF SYSTEMS:    GU Review Male:   Patient denies trouble starting your stream, get up at night to urinate, hard to postpone urination, have to strain to urinate , penile pain, erection problems, leakage of urine, stream starts and stops, frequent urination,  and burning/ pain with urination.  Gastrointestinal (Upper):   Patient denies nausea, vomiting, and indigestion/ heartburn.  Gastrointestinal (Lower):   Patient denies diarrhea and constipation.  Constitutional:   Patient denies fever, night sweats, weight loss, and fatigue.  Skin:   Patient denies skin rash/ lesion and itching.  Eyes:   Patient denies blurred vision and double vision.  Ears/ Nose/ Throat:   Patient denies sore throat and sinus problems.  Hematologic/Lymphatic:   Patient denies swollen glands and easy bruising.  Cardiovascular:   Patient denies leg swelling and chest pains.  Respiratory:   Patient denies cough and shortness of breath.  Endocrine:   Patient denies excessive thirst.  Musculoskeletal:   Patient denies back pain and joint pain.  Neurological:   Patient denies headaches and dizziness.  Psychologic:   Patient denies depression and anxiety.   VITAL SIGNS: None   MULTI-SYSTEM PHYSICAL EXAMINATION:    Constitutional: Well-nourished. No physical deformities. Normally developed. Good grooming.  Respiratory: No labored breathing, no use of accessory muscles.   Cardiovascular: Normal temperature, normal extremity pulses, no swelling, no varicosities.     Complexity of Data:  Source  Of History:  Patient  Records Review:   Previous Patient Records  Urine Test Review:   Urinalysis   11/20/11 10/17/11  PSA  Total PSA 2.77  49.83     PROCEDURES:         Flexible Cystoscopy - 52000  Risks, benefits, and some of the potential complications of the procedure were discussed at length with the patient including infection, bleeding, voiding discomfort, urinary retention, fever, chills, sepsis, and others. All questions were answered. Informed consent was obtained. Antibiotic prophylaxis was given. Sterile technique and intraurethral analgesia were used.  Meatus:  Normal size. Normal location. Normal condition.  Urethra:  No strictures.  External Sphincter:  Normal.   Verumontanum:  Normal.  Prostate:  Moderately obstructing prostate, high bladder neck.  Bladder Neck:  Non-obstructing.  Ureteral Orifices:  Right UO open with good E flux, unable to visualize left UO due to obstruction from tumor.  Bladder:  Tumor noted at the left trigone. No other tumors noted in the bladder no significant diverticuli moderate trabeculations.      The lower urinary tract was carefully examined. The procedure was well-tolerated and without complications. Antibiotic instructions were given. Instructions were given to call the office immediately for bloody urine, difficulty urinating, urinary retention, painful or frequent urination, fever, chills, nausea, vomiting or other illness. The patient stated that he understood these instructions and would comply with them.         Urinalysis w/Scope Dipstick Dipstick Cont'd Micro  Color: Yellow Bilirubin: Neg mg/dL WBC/hpf: 0 - 5/hpf  Appearance: Clear Ketones: Neg mg/dL RBC/hpf: 0 - 2/hpf  Specific Gravity: 1.015 Blood: Trace ery/uL Bacteria: NS (Not Seen)  pH: <=5.0 Protein: 1+ mg/dL Cystals: NS (Not Seen)  Glucose: Neg mg/dL Urobilinogen: 0.2 mg/dL Casts: Hyaline    Nitrites: Neg Trichomonas: Not Present    Leukocyte Esterase: Neg leu/uL Mucous: Not Present      Epithelial Cells: NS (Not Seen)      Yeast: NS (Not Seen)      Sperm: Not Present    ASSESSMENT:      ICD-10 Details  1 GU:   Acute prostatitis - N41.0   2   History of bladder cancer - Z85.51   3   Bladder tumor/neoplasm - D41.4    PLAN:           Orders Labs Urine Culture, BMP, CBC with Diff          Schedule X-Rays: 2 Weeks - C.T. Hematuria With and Without I.V. Contrast          Document Letter(s):  Created for Patient: Clinical Summary         Notes:   Bladder tumor: Cystoscopy demonstrates recurrence of bladder tumor in the left trigone of the left UO will plan for TURBT today.   PT has cardiac clearance form NP Retha Cast. Hold Eliquis  2  days.   UCX negative    We discussed risk benefits alternatives to transurethral resection of bladder tumor. Benefits are diagnostic and therapeutic which include removal of bladder tumor and relieve the possible irritative symptoms from bladder tumor. We discussed risk including not being able to remove all of the tumor, possibility of perforation of the bladder requiring long-term catheter or operative intervention to repair. The need for catheter postoperatively was also discussed, as well as postoperative lower urinary tract symptoms in the immediate postop period. Patient voiced their understanding and would like to proceed with the surgery.

## 2023-08-17 DIAGNOSIS — H353134 Nonexudative age-related macular degeneration, bilateral, advanced atrophic with subfoveal involvement: Secondary | ICD-10-CM | POA: Diagnosis not present

## 2023-08-17 LAB — IRON AND IRON BINDING CAPACITY (CC-WL,HP ONLY)
Iron: 85 ug/dL (ref 45–182)
Saturation Ratios: 33 % (ref 17.9–39.5)
TIBC: 259 ug/dL (ref 250–450)
UIBC: 174 ug/dL (ref 117–376)

## 2023-08-18 ENCOUNTER — Ambulatory Visit (HOSPITAL_BASED_OUTPATIENT_CLINIC_OR_DEPARTMENT_OTHER): Admitting: Anesthesiology

## 2023-08-18 ENCOUNTER — Ambulatory Visit (HOSPITAL_COMMUNITY)
Admission: RE | Admit: 2023-08-18 | Discharge: 2023-08-18 | Disposition: A | Discharge status: Home / Self Care | Source: Ambulatory Visit | Attending: Urology | Admitting: Urology

## 2023-08-18 ENCOUNTER — Ambulatory Visit (HOSPITAL_COMMUNITY)

## 2023-08-18 ENCOUNTER — Encounter (HOSPITAL_COMMUNITY)
Admission: RE | Disposition: A | Discharge status: Home / Self Care | Payer: Self-pay | Source: Ambulatory Visit | Attending: Urology

## 2023-08-18 ENCOUNTER — Encounter (HOSPITAL_COMMUNITY): Payer: Self-pay | Admitting: Urology

## 2023-08-18 ENCOUNTER — Ambulatory Visit (HOSPITAL_COMMUNITY): Payer: Self-pay | Admitting: Physician Assistant

## 2023-08-18 DIAGNOSIS — I130 Hypertensive heart and chronic kidney disease with heart failure and stage 1 through stage 4 chronic kidney disease, or unspecified chronic kidney disease: Secondary | ICD-10-CM | POA: Diagnosis not present

## 2023-08-18 DIAGNOSIS — D4959 Neoplasm of unspecified behavior of other genitourinary organ: Secondary | ICD-10-CM | POA: Diagnosis not present

## 2023-08-18 DIAGNOSIS — I25119 Atherosclerotic heart disease of native coronary artery with unspecified angina pectoris: Secondary | ICD-10-CM

## 2023-08-18 DIAGNOSIS — I4891 Unspecified atrial fibrillation: Secondary | ICD-10-CM | POA: Insufficient documentation

## 2023-08-18 DIAGNOSIS — E785 Hyperlipidemia, unspecified: Secondary | ICD-10-CM | POA: Diagnosis not present

## 2023-08-18 DIAGNOSIS — I110 Hypertensive heart disease with heart failure: Secondary | ICD-10-CM | POA: Diagnosis not present

## 2023-08-18 DIAGNOSIS — D494 Neoplasm of unspecified behavior of bladder: Secondary | ICD-10-CM | POA: Diagnosis not present

## 2023-08-18 DIAGNOSIS — N184 Chronic kidney disease, stage 4 (severe): Secondary | ICD-10-CM | POA: Diagnosis not present

## 2023-08-18 DIAGNOSIS — Z950 Presence of cardiac pacemaker: Secondary | ICD-10-CM | POA: Insufficient documentation

## 2023-08-18 DIAGNOSIS — E1122 Type 2 diabetes mellitus with diabetic chronic kidney disease: Secondary | ICD-10-CM | POA: Diagnosis not present

## 2023-08-18 DIAGNOSIS — I509 Heart failure, unspecified: Secondary | ICD-10-CM | POA: Insufficient documentation

## 2023-08-18 DIAGNOSIS — I5033 Acute on chronic diastolic (congestive) heart failure: Secondary | ICD-10-CM | POA: Diagnosis not present

## 2023-08-18 DIAGNOSIS — E119 Type 2 diabetes mellitus without complications: Secondary | ICD-10-CM | POA: Diagnosis not present

## 2023-08-18 DIAGNOSIS — Z7901 Long term (current) use of anticoagulants: Secondary | ICD-10-CM | POA: Insufficient documentation

## 2023-08-18 DIAGNOSIS — I2510 Atherosclerotic heart disease of native coronary artery without angina pectoris: Secondary | ICD-10-CM | POA: Insufficient documentation

## 2023-08-18 DIAGNOSIS — Z87891 Personal history of nicotine dependence: Secondary | ICD-10-CM | POA: Insufficient documentation

## 2023-08-18 DIAGNOSIS — C670 Malignant neoplasm of trigone of bladder: Secondary | ICD-10-CM

## 2023-08-18 DIAGNOSIS — Z951 Presence of aortocoronary bypass graft: Secondary | ICD-10-CM | POA: Insufficient documentation

## 2023-08-18 DIAGNOSIS — N135 Crossing vessel and stricture of ureter without hydronephrosis: Secondary | ICD-10-CM | POA: Diagnosis not present

## 2023-08-18 DIAGNOSIS — D090 Carcinoma in situ of bladder: Secondary | ICD-10-CM | POA: Diagnosis not present

## 2023-08-18 LAB — GLUCOSE, CAPILLARY
Glucose-Capillary: 101 mg/dL — ABNORMAL HIGH (ref 70–99)
Glucose-Capillary: 120 mg/dL — ABNORMAL HIGH (ref 70–99)

## 2023-08-18 SURGERY — TURBT, WITH CHEMOTHERAPEUTIC AGENT INSTILLATION INTO BLADDER
Anesthesia: General | Laterality: Left

## 2023-08-18 MED ORDER — 0.9 % SODIUM CHLORIDE (POUR BTL) OPTIME
TOPICAL | Status: DC | PRN
  Administered 2023-08-18: 1000 mL

## 2023-08-18 MED ORDER — PROPOFOL 10 MG/ML IV BOLUS
INTRAVENOUS | Status: DC | PRN
  Administered 2023-08-18: 20 mg via INTRAVENOUS

## 2023-08-18 MED ORDER — LACTATED RINGERS IV SOLN: INTRAVENOUS | Status: DC

## 2023-08-18 MED ORDER — ONDANSETRON HCL 4 MG/2ML IJ SOLN
INTRAMUSCULAR | Status: DC | PRN
  Administered 2023-08-18: 4 mg via INTRAVENOUS

## 2023-08-18 MED ORDER — POLYETHYLENE GLYCOL 3350 17 G PO PACK: 17.0000 g | PACK | Freq: Every day | ORAL | 0 refills | Status: DC

## 2023-08-18 MED ORDER — LIDOCAINE HCL (PF) 2 % IJ SOLN
INTRAMUSCULAR | Status: AC
  Filled 2023-08-18: qty 5

## 2023-08-18 MED ORDER — ROCURONIUM BROMIDE 100 MG/10ML IV SOLN
INTRAVENOUS | Status: DC | PRN
  Administered 2023-08-18: 50 mg via INTRAVENOUS

## 2023-08-18 MED ORDER — DEXAMETHASONE SODIUM PHOSPHATE 10 MG/ML IJ SOLN
INTRAMUSCULAR | Status: AC
  Filled 2023-08-18: qty 1

## 2023-08-18 MED ORDER — LIDOCAINE HCL (CARDIAC) PF 100 MG/5ML IV SOSY
PREFILLED_SYRINGE | INTRAVENOUS | Status: DC | PRN
  Administered 2023-08-18: 80 mg via INTRAVENOUS

## 2023-08-18 MED ORDER — ORAL CARE MOUTH RINSE: 15.0000 mL | Freq: Once | OROMUCOSAL | Status: AC

## 2023-08-18 MED ORDER — STERILE WATER FOR IRRIGATION IR SOLN
Status: DC | PRN
  Administered 2023-08-18: 500 mL

## 2023-08-18 MED ORDER — FENTANYL CITRATE PF 50 MCG/ML IJ SOSY: 25.0000 ug | PREFILLED_SYRINGE | INTRAMUSCULAR | Status: DC | PRN

## 2023-08-18 MED ORDER — LACTATED RINGERS IV SOLN: INTRAVENOUS | Status: DC | PRN

## 2023-08-18 MED ORDER — ROCURONIUM BROMIDE 10 MG/ML (PF) SYRINGE
PREFILLED_SYRINGE | INTRAVENOUS | Status: AC
  Filled 2023-08-18: qty 10

## 2023-08-18 MED ORDER — ONDANSETRON HCL 4 MG/2ML IJ SOLN
INTRAMUSCULAR | Status: AC
  Filled 2023-08-18: qty 2

## 2023-08-18 MED ORDER — CIPROFLOXACIN IN D5W 400 MG/200ML IV SOLN
400.0000 mg | INTRAVENOUS | Status: AC
  Administered 2023-08-18: 400 mg via INTRAVENOUS
  Filled 2023-08-18: qty 200

## 2023-08-18 MED ORDER — PHENYLEPHRINE HCL (PRESSORS) 10 MG/ML IV SOLN
INTRAVENOUS | Status: AC
  Filled 2023-08-18: qty 1

## 2023-08-18 MED ORDER — PROPOFOL 10 MG/ML IV BOLUS
INTRAVENOUS | Status: AC
  Filled 2023-08-18: qty 20

## 2023-08-18 MED ORDER — ETOMIDATE 2 MG/ML IV SOLN
INTRAVENOUS | Status: DC | PRN
  Administered 2023-08-18: 12 mg via INTRAVENOUS

## 2023-08-18 MED ORDER — SUGAMMADEX SODIUM 200 MG/2ML IV SOLN
INTRAVENOUS | Status: DC | PRN
  Administered 2023-08-18: 200 mg via INTRAVENOUS

## 2023-08-18 MED ORDER — CHLORHEXIDINE GLUCONATE 0.12 % MT SOLN
15.0000 mL | Freq: Once | OROMUCOSAL | Status: AC
  Administered 2023-08-18: 15 mL via OROMUCOSAL

## 2023-08-18 MED ORDER — IOHEXOL 300 MG/ML  SOLN
INTRAMUSCULAR | Status: DC | PRN
Start: 2023-08-18 — End: 2023-08-18
  Administered 2023-08-18: 9 mL

## 2023-08-18 MED ORDER — SODIUM CHLORIDE 0.9 % IR SOLN
Status: DC | PRN
  Administered 2023-08-18: 6000 mL

## 2023-08-18 MED ORDER — FENTANYL CITRATE (PF) 100 MCG/2ML IJ SOLN
INTRAMUSCULAR | Status: DC | PRN
  Administered 2023-08-18: 50 ug via INTRAVENOUS

## 2023-08-18 MED ORDER — ACETAMINOPHEN 500 MG PO TABS
1000.0000 mg | ORAL_TABLET | Freq: Once | ORAL | Status: AC
  Administered 2023-08-18: 1000 mg via ORAL
  Filled 2023-08-18: qty 2

## 2023-08-18 MED ORDER — METHOCARBAMOL 750 MG PO TABS
750.0000 mg | ORAL_TABLET | Freq: Four times a day (QID) | ORAL | 0 refills | Status: AC
Start: 2023-08-18 — End: 2023-08-23

## 2023-08-18 MED ORDER — FENTANYL CITRATE (PF) 100 MCG/2ML IJ SOLN
INTRAMUSCULAR | Status: AC
  Filled 2023-08-18: qty 2

## 2023-08-18 MED ORDER — ONDANSETRON HCL 4 MG/2ML IJ SOLN: 4.0000 mg | Freq: Once | INTRAMUSCULAR | Status: DC | PRN

## 2023-08-18 MED ORDER — ETOMIDATE 2 MG/ML IV SOLN
INTRAVENOUS | Status: AC
  Filled 2023-08-18: qty 10

## 2023-08-18 MED ORDER — PHENYLEPHRINE 80 MCG/ML (10ML) SYRINGE FOR IV PUSH (FOR BLOOD PRESSURE SUPPORT)
PREFILLED_SYRINGE | INTRAVENOUS | Status: AC
  Filled 2023-08-18: qty 10

## 2023-08-18 MED ORDER — DEXAMETHASONE SODIUM PHOSPHATE 10 MG/ML IJ SOLN
INTRAMUSCULAR | Status: DC | PRN
  Administered 2023-08-18: 4 mg via INTRAVENOUS

## 2023-08-18 SURGICAL SUPPLY — 17 items
BAG URINE DRAIN 2000ML AR STRL (UROLOGICAL SUPPLIES) IMPLANT
BAG URO CATCHER STRL LF (MISCELLANEOUS) ×1 IMPLANT
CATH FOLEY 2WAY SLVR 5CC 20FR (CATHETERS) IMPLANT
DRAPE FOOT SWITCH (DRAPES) ×1 IMPLANT
ELECT REM PT RETURN 15FT ADLT (MISCELLANEOUS) ×1 IMPLANT
GLOVE SURG LX STRL 8.0 MICRO (GLOVE) ×1 IMPLANT
GOWN STRL REUS W/ TWL XL LVL3 (GOWN DISPOSABLE) ×1 IMPLANT
GUIDEWIRE STR DUAL SENSOR (WIRE) IMPLANT
KIT TURNOVER KIT A (KITS) IMPLANT
LOOP CUT BIPOLAR 24F LRG (ELECTROSURGICAL) IMPLANT
MANIFOLD NEPTUNE II (INSTRUMENTS) ×1 IMPLANT
NDL SAFETY ECLIPSE 18X1.5 (NEEDLE) ×1 IMPLANT
PACK CYSTO (CUSTOM PROCEDURE TRAY) ×1 IMPLANT
STENT URET 6FRX24 CONTOUR (STENTS) IMPLANT
SYRINGE TOOMEY IRRIG 70ML (MISCELLANEOUS) IMPLANT
TUBING CONNECTING 10 (TUBING) ×1 IMPLANT
TUBING UROLOGY SET (TUBING) ×1 IMPLANT

## 2023-08-18 NOTE — Op Note (Signed)
 Operative Note  Preoperative diagnosis:  1.  Bladder tumor  Postoperative diagnosis: 1.  Bladder and distal ureteral tumor  Procedure(s): 1.  TURBT 2 cm bladder lesion 2.  Retrograde pyelogram 3.  Diagnostic left ureteroscopy 4.  Retrograde ureteral stent placement 6 x 24 double-J without strings  Surgeon: Aimee Alf MD  Assistants:  None  Anesthesia:  General  Complications:  None  EBL: Minimal  Specimens: 1.  Bladder tumor  Drains/Catheters: 1.  20 French two-way catheter 10 cc in the balloon, 6 x 24 double-J stent without strings  Intraoperative findings:   Tumor over the left trigone tumor coming out of the left ureteral orifice Tumor invades into the ureteral orifice about 1 cm this was attempted to be resected Significant narrowing at the distal ureter being difficult for ureteroscope to pass 6 x 24 double-J stent placed in the left ureter  Tumor description: Papillary not concerning for carcinoma in situ  Recurrent   yes        Primary   no    Bimanual exam under anesthesia:        No  Visually complete resection:                 Yes  Visualization of detrusor muscle in resection base:        Yes  Visual evaluation for perforation:             No  Retrograde pyelogram interpretation: Small narrowing of the distal ureter about 2 to 3 cm in to the distal ureter.  No filling defects proximal to that to the kidney.   Indication:  [name] is a 89 y.o. male with a history of high risk bladder cancer diagnosed several years ago he represented to the clinic where cystoscopy demonstrated recurrence of bladder cancer.  He is here today for management of that recurrence.  All the risks, benefits were discussed with the patient to include but not limited to infection, pain, bleeding, damage to adjacent structures, need for further operations, adverse reaction to anesthesia and death.  Patient understands these risks and agrees to proceed with the  operation as planned.    Description of procedure: After informed consent was obtained from the patient, the patient was taken to the operating room. General anesthesia was administered. The patient was placed in dorsal lithotomy position and prepped and draped in usual sterile fashion. Sequential compression devices were applied to lower extremities at the beginning of the case for DVT prophylaxis. Antibiotics were infused prior to surgery start time. A surgical time-out was performed to properly identify the patient, the surgery to be performed, and the surgical site.     We then passed the 21-French rigid cystoscope down the urethra and into the bladder under direct vision without any difficulty. The anterior urethral was normal. The prostate was non-obstructing. The bladder was inspected with 30 and 70 degree lenses. Once in the bladder, systematic evaluation of bladder revealed a tumor on the left trigone where the left ureteral orifice was unable to be identified.  The right ureter ureteral orifice was easily identified and not involved in the tumor.    We then removed the cystoscope and then passed down the 26 French resectoscope sheath down the urethra into the bladder under direct vision with the visual obturator. The tumor was resected down to muscle. The TUR bladder tumor chips were retrieved from the bladder and each region of resection was passed off the field as a separate  specimen.  Hemostasis was achieved using electrocautery.   During the resection was revealed that the tumor was coming out of the left ureteral orifice.  This point the resectoscope was exchanged for the cystoscope and a sensor wire was advanced into the left ureteral orifice proper placement in the renal pelvis was confirmed with fluoroscopy.  Then a single-lumen semirigid ureteroscope was advanced to the left ureter.  About 3 to 4 cm in there was a significant narrowing that was encountered that the ureteroscope could not  pass even with assistance of additional wires.  Retrograde pyelogram was then performed demonstrating distal ureter but no filling defects proximal to this.  There was some tumor on the medial side of the ureter which was peeling out into the resection incision made to try to remove this.  The resectoscope was reintroduced into the bladder and this medial area coming from the ureter was resected.  We then replaced the resectoscope with the cystoscope over the wire.  A 6 x 24 double-J stent was then placed in the the left ureter placed in the kidney was confirmed for proper proper placement the bladder was confirmed visually. The cystoscope was then removed and a Foley catheter 32fr was placed.  The patient tolerated the procedure well with no complication and was awoken from anesthesia and taken to recovery in stable condition.     Due to the stent being placed decision was made not to do gemcitabine  Plan: Patient will void trial in 3 days and follow-up with me to discuss management of the stent.  Aimee Alf  [facility]

## 2023-08-18 NOTE — Transfer of Care (Signed)
 Immediate Anesthesia Transfer of Care Note  Patient: [name]  Procedure(s) Performed: TURBT, LEFT STENT PLACEMENT (Left)  Patient Location: PACU  Anesthesia Type:General  Level of Consciousness: awake and alert   Airway & Oxygen Therapy: Patient Spontanous Breathing and Patient connected to face mask oxygen  Post-op Assessment: Report given to RN and Post -op Vital signs reviewed and stable  Post vital signs: Reviewed and stable  Last Vitals:  Vitals Value Taken Time  BP    Temp    Pulse    Resp    SpO2      Last Pain:  Vitals:   08/18/23 1254  TempSrc:   PainSc: 0-No pain         Complications: No notable events documented.

## 2023-08-18 NOTE — Anesthesia Procedure Notes (Signed)
 Procedure Name: Intubation Date/Time: 08/18/2023 3:08 PM  Performed by: Josetta Niece, CRNAPre-anesthesia Checklist: Patient identified, Emergency Drugs available, Suction available and Patient being monitored Patient Re-evaluated:Patient Re-evaluated prior to induction Oxygen Delivery Method: Circle System Utilized Preoxygenation: Pre-oxygenation with 100% oxygen Induction Type: IV induction Ventilation: Mask ventilation without difficulty Laryngoscope Size: Mac and 3 Grade View: Grade I Tube type: Oral Tube size: 8.0 mm Number of attempts: 1 Airway Equipment and Method: Stylet Placement Confirmation: ETT inserted through vocal cords under direct vision, positive ETCO2 and breath sounds checked- equal and bilateral Secured at: 23 cm Tube secured with: Tape Dental Injury: Teeth and Oropharynx as per pre-operative assessment

## 2023-08-18 NOTE — Discharge Instructions (Addendum)
 Transurethral Resection of Bladder Tumor (TURBT) or Bladder Biopsy   Definition:  Transurethral Resection of the Bladder Tumor is a surgical procedure used to diagnose and remove tumors within the bladder. TURBT is the most common treatment for early stage bladder cancer.  General instructions:     Your recent bladder surgery requires very little post hospital care but some definite precautions.  Despite the fact that no skin incisions were used, the area around the bladder incisions are raw and covered with scabs to promote healing and prevent bleeding. Certain precautions are needed to insure that the scabs are not disturbed over the next 2-4 weeks while the healing proceeds.  Because the raw surface inside your bladder and the irritating effects of urine you may expect frequency of urination and/or urgency (a stronger desire to urinate) and perhaps even getting up at night more often. This will usually resolve or improve slowly over the healing period. You may see some blood in your urine over the first 6 weeks. Do not be alarmed, even if the urine was clear for a while. Get off your feet and drink lots of fluids until clearing occurs. If you start to pass clots or don't improve call us .  Catheter: Due to the nature of this surgery some patients need to be discharged with a catheter. If you are discharged with a catheter instructions on how to care for the catheter will be attached to your discharge paperwork. You will be called to schedule an appointment to remove the catheter.   Diet:  You may return to your normal diet immediately. Because of the raw surface of your bladder, alcohol, spicy foods, foods high in acid and drinks with caffeine may cause irritation or frequency and should be used in moderation. To keep your urine flowing freely and avoid constipation, drink plenty of fluids during the day (8-10 glasses). Tip: Avoid cranberry juice because it is very acidic.  Activity:  Do not  lift more than 10 LBS for 2 weeks Do not strain with bowel movents.  Your physical activity doesn't need to be restricted. However, if you are very active, you may see some blood in the urine. We suggest that you reduce your activity under the circumstances until the bleeding has stopped.  Bowels:  It is important to keep your bowels regular during the postoperative period. Straining with bowel movements can cause bleeding. A bowel movement every other day is reasonable. Use a mild laxative if needed, such as milk of magnesia 2-3 tablespoons, or 2 Dulcolax tablets. Call if you continue to have problems. If you had been taking narcotics for pain, before, during or after your surgery, you may be constipated. Take a laxative if necessary.    Medication:  You should resume your pre-surgery medications unless told not to. In addition you may be given an antibiotic to prevent or treat infection. Antibiotics are not always necessary. All medication should be taken as prescribed until the bottles are finished unless you are having an unusual reaction to one of the drugs.  You can Restart Eliquis  48 hours after surgery

## 2023-08-19 NOTE — Anesthesia Postprocedure Evaluation (Signed)
 Anesthesia Post Note  Patient: [name]  Procedure(s) Performed: TURBT, LEFT STENT PLACEMENT (Left)     Patient location during evaluation: PACU Anesthesia Type: General Level of consciousness: awake and alert Pain management: pain level controlled Vital Signs Assessment: post-procedure vital signs reviewed and stable Respiratory status: spontaneous breathing, nonlabored ventilation and respiratory function stable Cardiovascular status: blood pressure returned to baseline and stable Postop Assessment: no apparent nausea or vomiting Anesthetic complications: no   No notable events documented.  Last Vitals:  Vitals:   08/18/23 1745 08/18/23 1800  BP: (!) 130/54 (!) 130/54  Pulse: 61 60  Resp:  20  Temp:  (!) 36.4 C  SpO2: 94% 94%    Last Pain:  Vitals:   08/18/23 1725  TempSrc: Oral  PainSc: 0-No pain                 Erin Havers

## 2023-08-20 LAB — SURGICAL PATHOLOGY

## 2023-08-21 DIAGNOSIS — C678 Malignant neoplasm of overlapping sites of bladder: Secondary | ICD-10-CM | POA: Diagnosis not present

## 2023-08-28 ENCOUNTER — Encounter (HOSPITAL_COMMUNITY): Payer: Self-pay | Admitting: Urology

## 2023-08-28 DIAGNOSIS — C678 Malignant neoplasm of overlapping sites of bladder: Secondary | ICD-10-CM | POA: Diagnosis not present

## 2023-08-28 NOTE — Addendum Note (Signed)
 Addendum  created 08/28/23 0800 by Erin Havers, MD   Intraprocedure Event edited, Intraprocedure Staff edited

## 2023-09-02 ENCOUNTER — Ambulatory Visit: Payer: Medicare Other | Admitting: Cardiovascular Disease

## 2023-09-10 NOTE — Progress Notes (Signed)
 Remote pacemaker transmission.

## 2023-09-14 DIAGNOSIS — N184 Chronic kidney disease, stage 4 (severe): Secondary | ICD-10-CM | POA: Diagnosis not present

## 2023-09-18 DIAGNOSIS — C678 Malignant neoplasm of overlapping sites of bladder: Secondary | ICD-10-CM | POA: Diagnosis not present

## 2023-09-22 DIAGNOSIS — I5020 Unspecified systolic (congestive) heart failure: Secondary | ICD-10-CM | POA: Diagnosis not present

## 2023-09-22 DIAGNOSIS — D631 Anemia in chronic kidney disease: Secondary | ICD-10-CM | POA: Diagnosis not present

## 2023-09-22 DIAGNOSIS — I4891 Unspecified atrial fibrillation: Secondary | ICD-10-CM | POA: Diagnosis not present

## 2023-09-22 DIAGNOSIS — I129 Hypertensive chronic kidney disease with stage 1 through stage 4 chronic kidney disease, or unspecified chronic kidney disease: Secondary | ICD-10-CM | POA: Diagnosis not present

## 2023-09-22 DIAGNOSIS — E8770 Fluid overload, unspecified: Secondary | ICD-10-CM | POA: Diagnosis not present

## 2023-09-22 DIAGNOSIS — N184 Chronic kidney disease, stage 4 (severe): Secondary | ICD-10-CM | POA: Diagnosis not present

## 2023-09-25 ENCOUNTER — Inpatient Hospital Stay (HOSPITAL_BASED_OUTPATIENT_CLINIC_OR_DEPARTMENT_OTHER): Admitting: Hematology & Oncology

## 2023-09-25 ENCOUNTER — Inpatient Hospital Stay: Attending: Hematology & Oncology

## 2023-09-25 ENCOUNTER — Other Ambulatory Visit: Payer: Self-pay

## 2023-09-25 ENCOUNTER — Inpatient Hospital Stay

## 2023-09-25 ENCOUNTER — Encounter: Payer: Self-pay | Admitting: Hematology & Oncology

## 2023-09-25 VITALS — BP 134/40 | HR 62 | Temp 98.1°F | Resp 18 | Ht 67.0 in | Wt 141.0 lb

## 2023-09-25 DIAGNOSIS — N184 Chronic kidney disease, stage 4 (severe): Secondary | ICD-10-CM | POA: Diagnosis not present

## 2023-09-25 DIAGNOSIS — D508 Other iron deficiency anemias: Secondary | ICD-10-CM

## 2023-09-25 DIAGNOSIS — D509 Iron deficiency anemia, unspecified: Secondary | ICD-10-CM

## 2023-09-25 DIAGNOSIS — N1831 Chronic kidney disease, stage 3a: Secondary | ICD-10-CM

## 2023-09-25 DIAGNOSIS — C673 Malignant neoplasm of anterior wall of bladder: Secondary | ICD-10-CM | POA: Diagnosis not present

## 2023-09-25 DIAGNOSIS — D500 Iron deficiency anemia secondary to blood loss (chronic): Secondary | ICD-10-CM

## 2023-09-25 DIAGNOSIS — D631 Anemia in chronic kidney disease: Secondary | ICD-10-CM

## 2023-09-25 LAB — CMP (CANCER CENTER ONLY)
ALT: 93 U/L — ABNORMAL HIGH (ref 0–44)
AST: 95 U/L — ABNORMAL HIGH (ref 15–41)
Albumin: 3.8 g/dL (ref 3.5–5.0)
Alkaline Phosphatase: 111 U/L (ref 38–126)
Anion gap: 11 (ref 5–15)
BUN: 76 mg/dL — ABNORMAL HIGH (ref 8–23)
CO2: 21 mmol/L — ABNORMAL LOW (ref 22–32)
Calcium: 8.7 mg/dL — ABNORMAL LOW (ref 8.9–10.3)
Chloride: 106 mmol/L (ref 98–111)
Creatinine: 3.34 mg/dL — ABNORMAL HIGH (ref 0.61–1.24)
GFR, Estimated: 17 mL/min — ABNORMAL LOW (ref >60)
Glucose, Bld: 181 mg/dL — ABNORMAL HIGH (ref 70–99)
Potassium: 3.7 mmol/L (ref 3.5–5.1)
Sodium: 138 mmol/L (ref 135–145)
Total Bilirubin: 0.5 mg/dL (ref 0.0–1.2)
Total Protein: 8 g/dL (ref 6.5–8.1)

## 2023-09-25 LAB — CBC WITH DIFFERENTIAL (CANCER CENTER ONLY)
Abs Immature Granulocytes: 0.06 10*3/uL (ref 0.00–0.07)
Basophils Absolute: 0 10*3/uL (ref 0.0–0.1)
Basophils Relative: 0 %
Eosinophils Absolute: 0.2 10*3/uL (ref 0.0–0.5)
Eosinophils Relative: 3 %
HCT: 30.1 % — ABNORMAL LOW (ref 39.0–52.0)
Hemoglobin: 9.8 g/dL — ABNORMAL LOW (ref 13.0–17.0)
Immature Granulocytes: 1 %
Lymphocytes Relative: 29 %
Lymphs Abs: 2.1 10*3/uL (ref 0.7–4.0)
MCH: 31.4 pg (ref 26.0–34.0)
MCHC: 32.6 g/dL (ref 30.0–36.0)
MCV: 96.5 fL (ref 80.0–100.0)
Monocytes Absolute: 0.6 10*3/uL (ref 0.1–1.0)
Monocytes Relative: 8 %
Neutro Abs: 4.2 10*3/uL (ref 1.7–7.7)
Neutrophils Relative %: 59 %
Platelet Count: 118 10*3/uL — ABNORMAL LOW (ref 150–400)
RBC: 3.12 MIL/uL — ABNORMAL LOW (ref 4.22–5.81)
RDW: 16.9 % — ABNORMAL HIGH (ref 11.5–15.5)
WBC Count: 7.1 10*3/uL (ref 4.0–10.5)
nRBC: 0 % (ref 0.0–0.2)

## 2023-09-25 LAB — RETICULOCYTES
Immature Retic Fract: 5.6 % (ref 2.3–15.9)
RBC.: 3.07 MIL/uL — ABNORMAL LOW (ref 4.22–5.81)
Retic Count, Absolute: 40.8 10*3/uL (ref 19.0–186.0)
Retic Ct Pct: 1.3 % (ref 0.4–3.1)

## 2023-09-25 LAB — FERRITIN: Ferritin: 732 ng/mL — ABNORMAL HIGH (ref 24–336)

## 2023-09-25 LAB — LACTATE DEHYDROGENASE: LDH: 135 U/L (ref 98–192)

## 2023-09-25 MED ORDER — DARBEPOETIN ALFA 300 MCG/0.6ML IJ SOSY
300.0000 ug | PREFILLED_SYRINGE | Freq: Once | INTRAMUSCULAR | Status: AC
  Administered 2023-09-25: 300 ug via SUBCUTANEOUS
  Filled 2023-09-25: qty 0.6

## 2023-09-25 NOTE — Progress Notes (Signed)
 Hematology and Oncology Follow Up Visit  [name] [id number] [birth year] 89 y.o. 09/25/2023   Principle Diagnosis:  Erythropoietin  deficiency anemia Iron deficiency anemia Chronic atrial fibrillation  Current Therapy:   IV iron as indicated --Ferrlecit given on 04/21/2023  Aranesp  300 g sq prn hemoglobin < 11 - last dose given on  09/19/2021 Eliquis  2.5 mg p.o. twice daily   Interim History:  Mr. Kipp is here today for follow-up.  Overall, things are going okay.  Unfortunately, his bladder cancer has come back.  This is superficial bladder cancer.  He had some bleeding when he came back.  He has some bleeding when he had surgery to remove it.  He goes back for intravesicular therapy.  He continues on Eliquis .  This is for his chronic atrial fibrillation.  Cardiology is managing this.  He has had no cough.  Has had no nausea or vomiting.  Thankfully, his wife is doing better with her back issues.  He has had a little bit of swelling in the lower legs.  He has had little bit of weakness in the legs.  His iron studies that were done back in April showed a ferritin of 545 with an iron saturation of 33%.  Thankfully, he has not been hospitalized with any exacerbations of congestive heart failure.  Overall, I would say his performance status is probably ECOG 2-3.   Medications:  Allergies as of 09/25/2023       Reactions   Lisinopril  Other (See Comments)    he has difficulty controlling potassium on ace/arb- lisinopril  was dc'd in the past 2/2 to hyperkalemia documented by [facility] P[facility] 01/2021   Penicillins Hives   Has patient had a PCN reaction causing immediate rash, facial/tongue/throat swelling, SOB or lightheadedness with hypotension: No Has patient had a PCN reaction causing severe rash involving mucus membranes or skin necrosis: Yes Has patient had a PCN reaction that required hospitalization: No Has patient had a PCN reaction occurring within the last 10  years: No If all of the above answers are "NO", then may proceed with Cephalosporin use.   Vancomycin  Other (See Comments)   Edema and myalgia        Medication List        Accurate as of Sep 25, 2023  3:36 PM. If you have any questions, ask your nurse or doctor.          allopurinol  100 MG tablet Commonly known as: ZYLOPRIM  Take 1 tablet (100 mg total) by mouth daily.   amLODipine  5 MG tablet Commonly known as: NORVASC  Take 1 tablet (5 mg total) by mouth daily.   atorvastatin  20 MG tablet Commonly known as: LIPITOR Take 1 tablet (20 mg total) by mouth at bedtime.   carvedilol  6.25 MG tablet Commonly known as: COREG  Take 1 tablet (6.25 mg total) by mouth 2 (two) times daily with a meal.   Cholecalciferol  125 MCG (5000 UT) capsule Take 5,000 Units by mouth daily.   Darbepoetin Alfa  300 MCG/0.6ML Sosy injection Commonly known as: ARANESP  Inject 300 mcg into the skin once.   dicyclomine  10 MG capsule Commonly known as: BENTYL  Take 1 capsule (10 mg total) by mouth every 6 (six) hours as needed.   Eliquis  2.5 MG Tabs tablet Generic drug: apixaban  TAKE 1 TABLET BY MOUTH TWICE A DAY   Feraheme  510 MG/17ML Soln injection Generic drug: ferumoxytol  Inject 510 mg into the vein as needed.   furosemide  40 MG tablet Commonly known as: LASIX  TAKE 1  TABLET IN THE MORNING AND TAKE ONE-HALF (1/2) TABLET IN THE EVENING   latanoprost  0.005 % ophthalmic solution Commonly known as: XALATAN  Place 1 drop into both eyes at bedtime.   onetouch ultrasoft lancets Use to check sugar daily as instructed.   OneTouch Verio test strip Generic drug: glucose blood Use to check sugar daily as instructed.   OVER THE COUNTER MEDICATION Take 1 tablet by mouth 2 (two) times daily. MitoQ   polyethylene glycol 17 g packet Commonly known as: MiraLax  Take 17 g by mouth daily.   Potassium Chloride  ER 20 MEQ Tbcr Take 1 tablet (20 mEq total) by mouth 2 (two) times daily.    PreserVision AREDS 2 Caps Take 1 capsule by mouth 2 (two) times daily.   tamsulosin  0.4 MG Caps capsule Commonly known as: FLOMAX  Take 0.8 mg by mouth at bedtime.   timolol  0.5 % ophthalmic solution Commonly known as: TIMOPTIC  Place 1 drop into both eyes 2 (two) times daily.        Allergies:  Allergies  Allergen Reactions   Lisinopril  Other (See Comments)     he has difficulty controlling potassium on ace/arb- lisinopril  was dc'd in the past 2/2 to hyperkalemia documented by [facility] P[facility] 01/2021   Penicillins Hives    Has patient had a PCN reaction causing immediate rash, facial/tongue/throat swelling, SOB or lightheadedness with hypotension: No Has patient had a PCN reaction causing severe rash involving mucus membranes or skin necrosis: Yes Has patient had a PCN reaction that required hospitalization: No Has patient had a PCN reaction occurring within the last 10 years: No If all of the above answers are "NO", then may proceed with Cephalosporin use.    Vancomycin  Other (See Comments)    Edema and myalgia    Past Medical History, Surgical history, Social history, and Family History were reviewed and updated.  Review of Systems: Review of Systems  Constitutional: Negative.   HENT: Negative.    Eyes: Negative.   Respiratory: Negative.    Cardiovascular:  Positive for palpitations.  Gastrointestinal: Negative.   Genitourinary: Negative.   Musculoskeletal: Negative.   Skin: Negative.   Neurological: Negative.   Endo/Heme/Allergies: Negative.   Psychiatric/Behavioral: Negative.       Physical Exam: Vital signs show temperature of 98.1.  Pulse 62.  Blood pressure 134/40.  Weight is 141 pounds.   Wt Readings from Last 3 Encounters:  09/25/23 141 lb (64 kg)  08/18/23 141 lb 3.2 oz (64 kg)  08/14/23 141 lb 3.2 oz (64 kg)    Physical Exam Vitals reviewed.  HENT:     Head: Normocephalic and atraumatic.  Eyes:     Pupils: Pupils are equal, round, and reactive  to light.  Cardiovascular:     Rate and Rhythm: Normal rate and regular rhythm.     Heart sounds: Normal heart sounds.  Pulmonary:     Effort: Pulmonary effort is normal.     Breath sounds: Normal breath sounds.  Abdominal:     General: Bowel sounds are normal.     Palpations: Abdomen is soft.  Musculoskeletal:        General: No tenderness or deformity. Normal range of motion.     Cervical back: Normal range of motion.  Lymphadenopathy:     Cervical: No cervical adenopathy.  Skin:    General: Skin is warm and dry.     Findings: No erythema or rash.  Neurological:     Mental Status: He is alert and oriented to  person, place, and time.  Psychiatric:        Behavior: Behavior normal.        Thought Content: Thought content normal.        Judgment: Judgment normal.      Lab Results  Component Value Date   WBC 7.1 09/25/2023   HGB 9.8 (L) 09/25/2023   HCT 30.1 (L) 09/25/2023   MCV 96.5 09/25/2023   PLT 118 (L) 09/25/2023   Lab Results  Component Value Date   FERRITIN 545 (H) 08/14/2023   IRON 85 08/14/2023   TIBC 259 08/14/2023   UIBC 174 08/14/2023   IRONPCTSAT 33 08/14/2023   Lab Results  Component Value Date   RETICCTPCT 1.3 09/25/2023   RBC 3.12 (L) 09/25/2023   RBC 3.07 (L) 09/25/2023   No results found for: "KPAFRELGTCHN", "LAMBDASER", "KAPLAMBRATIO" No results found for: "IGGSERUM", "IGA", "IGMSERUM" Lab Results  Component Value Date   TOTALPROTELP 6.0 (L) 12/26/2015   ALBUMINELP 3.4 (L) 12/26/2015   A1GS 0.4 (H) 12/26/2015   A2GS 0.8 12/26/2015   BETS 0.4 12/26/2015   BETA2SER 0.3 12/26/2015   GAMS 0.8 12/26/2015   MSPIKE Not Observed 01/29/2016   SPEI SEE NOTE 12/26/2015     Chemistry      Component Value Date/Time   NA 138 09/25/2023 1442   NA 139 05/29/2023 0000   NA 145 04/09/2017 0905   NA 140 03/28/2016 0853   K 3.7 09/25/2023 1442   K 4.5 04/09/2017 0905   K 4.4 03/28/2016 0853   CL 106 09/25/2023 1442   CL 105 04/09/2017 0905    CO2 21 (L) 09/25/2023 1442   CO2 27 04/09/2017 0905   CO2 19 (L) 03/28/2016 0853   BUN 76 (H) 09/25/2023 1442   BUN 62 (A) 05/29/2023 0000   BUN 33 (H) 04/09/2017 0905   BUN 34.4 (H) 03/28/2016 0853   CREATININE 3.34 (H) 09/25/2023 1442   CREATININE 2.35 (H) 01/23/2023 1430   CREATININE 1.7 (H) 03/28/2016 0853   GLU 128 05/29/2023 0000      Component Value Date/Time   CALCIUM  8.7 (L) 09/25/2023 1442   CALCIUM  9.2 04/09/2017 0905   CALCIUM  9.2 03/28/2016 0853   ALKPHOS 111 09/25/2023 1442   ALKPHOS 69 04/09/2017 0905   ALKPHOS 85 03/28/2016 0853   AST 95 (H) 09/25/2023 1442   AST 23 03/28/2016 0853   ALT 93 (H) 09/25/2023 1442   ALT 27 04/09/2017 0905   ALT 37 03/28/2016 0853   BILITOT 0.5 09/25/2023 1442   BILITOT 0.79 03/28/2016 0853      Impression and Plan: Mr. Bogan is a very pleasant 89 yo caucasian gentleman with both erythropoietin  deficiency and iron deficiency anemia.   We will go ahead and give him Aranesp  today.  I will have to see what his iron levels look like.  I hate the fact that the bladder cancer came back.  Thankfully, it is still superficial.  For right now, we will plan to get him back to see us  in another month or so.  I know that he tries to make it to church.  Sometimes I will see him in charge.      Ivor Mars, MD 5/30/20253:36 PM

## 2023-09-25 NOTE — Patient Instructions (Signed)

## 2023-09-28 DIAGNOSIS — H353134 Nonexudative age-related macular degeneration, bilateral, advanced atrophic with subfoveal involvement: Secondary | ICD-10-CM | POA: Diagnosis not present

## 2023-09-28 LAB — IRON AND IRON BINDING CAPACITY (CC-WL,HP ONLY)
Iron: 90 ug/dL (ref 45–182)
Saturation Ratios: 35 % (ref 17.9–39.5)
TIBC: 255 ug/dL (ref 250–450)
UIBC: 165 ug/dL (ref 117–376)

## 2023-09-29 ENCOUNTER — Ambulatory Visit: Payer: Self-pay | Admitting: Podiatry

## 2023-10-02 DIAGNOSIS — C678 Malignant neoplasm of overlapping sites of bladder: Secondary | ICD-10-CM | POA: Diagnosis not present

## 2023-10-07 ENCOUNTER — Encounter: Payer: Self-pay | Admitting: Hematology & Oncology

## 2023-10-07 ENCOUNTER — Ambulatory Visit (INDEPENDENT_AMBULATORY_CARE_PROVIDER_SITE_OTHER): Payer: Self-pay | Admitting: Podiatry

## 2023-10-07 ENCOUNTER — Encounter: Payer: Self-pay | Admitting: Podiatry

## 2023-10-07 DIAGNOSIS — E1151 Type 2 diabetes mellitus with diabetic peripheral angiopathy without gangrene: Secondary | ICD-10-CM | POA: Diagnosis not present

## 2023-10-07 DIAGNOSIS — I70209 Unspecified atherosclerosis of native arteries of extremities, unspecified extremity: Secondary | ICD-10-CM | POA: Diagnosis not present

## 2023-10-07 DIAGNOSIS — M79671 Pain in right foot: Secondary | ICD-10-CM | POA: Diagnosis not present

## 2023-10-07 DIAGNOSIS — M79672 Pain in left foot: Secondary | ICD-10-CM

## 2023-10-07 DIAGNOSIS — B351 Tinea unguium: Secondary | ICD-10-CM | POA: Diagnosis not present

## 2023-10-07 NOTE — Progress Notes (Signed)
 Patient presents for evaluation and treatment of tenderness and some redness around nails feet.  Tenderness around toes with walking and wearing shoes.  Physical exam:  General appearance: Alert, pleasant, and in no acute distress.  Vascular: Pedal pulses: DP 2/4 B/L, PT 0/4 B/L.  Moderate edema lower legs bilaterally  Neurological:  No paresthesias or burning noted  Dermatologic:  Nails thickened, disfigured, discolored 1-5 BL with subungual debris.  Redness and hypertrophic nail folds along nail folds bilaterally but no signs of drainage or infection.  Musculoskeletal:     Diagnosis: 1. Painful onychomycotic nails 1 through 5 bilaterally. 2. Pain toes 1 through 5 bilaterally. 3.  Diabetes mellitus type 2 with PVD  Plan: Debrided onychomycotic nails 1 through 5 bilaterally.  Return 3 months

## 2023-10-09 DIAGNOSIS — C678 Malignant neoplasm of overlapping sites of bladder: Secondary | ICD-10-CM | POA: Diagnosis not present

## 2023-10-16 DIAGNOSIS — Z5111 Encounter for antineoplastic chemotherapy: Secondary | ICD-10-CM | POA: Diagnosis not present

## 2023-10-16 DIAGNOSIS — C678 Malignant neoplasm of overlapping sites of bladder: Secondary | ICD-10-CM | POA: Diagnosis not present

## 2023-10-20 ENCOUNTER — Encounter: Admitting: Cardiovascular Disease

## 2023-10-22 ENCOUNTER — Other Ambulatory Visit: Payer: Self-pay

## 2023-10-22 ENCOUNTER — Inpatient Hospital Stay: Admitting: Hematology & Oncology

## 2023-10-22 ENCOUNTER — Inpatient Hospital Stay: Attending: Hematology & Oncology

## 2023-10-22 ENCOUNTER — Inpatient Hospital Stay

## 2023-10-22 VITALS — BP 127/51 | HR 60 | Temp 97.4°F | Resp 16 | Ht 67.0 in | Wt 143.0 lb

## 2023-10-22 DIAGNOSIS — C673 Malignant neoplasm of anterior wall of bladder: Secondary | ICD-10-CM | POA: Diagnosis not present

## 2023-10-22 DIAGNOSIS — D508 Other iron deficiency anemias: Secondary | ICD-10-CM

## 2023-10-22 DIAGNOSIS — I480 Paroxysmal atrial fibrillation: Secondary | ICD-10-CM

## 2023-10-22 DIAGNOSIS — D631 Anemia in chronic kidney disease: Secondary | ICD-10-CM | POA: Diagnosis not present

## 2023-10-22 DIAGNOSIS — D500 Iron deficiency anemia secondary to blood loss (chronic): Secondary | ICD-10-CM

## 2023-10-22 DIAGNOSIS — I10 Essential (primary) hypertension: Secondary | ICD-10-CM

## 2023-10-22 DIAGNOSIS — N184 Chronic kidney disease, stage 4 (severe): Secondary | ICD-10-CM | POA: Diagnosis not present

## 2023-10-22 DIAGNOSIS — D509 Iron deficiency anemia, unspecified: Secondary | ICD-10-CM

## 2023-10-22 LAB — CBC WITH DIFFERENTIAL (CANCER CENTER ONLY)
Abs Immature Granulocytes: 0.02 10*3/uL (ref 0.00–0.07)
Basophils Absolute: 0 10*3/uL (ref 0.0–0.1)
Basophils Relative: 1 %
Eosinophils Absolute: 0.2 10*3/uL (ref 0.0–0.5)
Eosinophils Relative: 4 %
HCT: 32.9 % — ABNORMAL LOW (ref 39.0–52.0)
Hemoglobin: 10.8 g/dL — ABNORMAL LOW (ref 13.0–17.0)
Immature Granulocytes: 1 %
Lymphocytes Relative: 60 %
Lymphs Abs: 2.4 10*3/uL (ref 0.7–4.0)
MCH: 31.4 pg (ref 26.0–34.0)
MCHC: 32.8 g/dL (ref 30.0–36.0)
MCV: 95.6 fL (ref 80.0–100.0)
Monocytes Absolute: 0.9 10*3/uL (ref 0.1–1.0)
Monocytes Relative: 22 %
Neutro Abs: 0.5 10*3/uL — ABNORMAL LOW (ref 1.7–7.7)
Neutrophils Relative %: 12 %
Platelet Count: 111 10*3/uL — ABNORMAL LOW (ref 150–400)
RBC: 3.44 MIL/uL — ABNORMAL LOW (ref 4.22–5.81)
RDW: 16.8 % — ABNORMAL HIGH (ref 11.5–15.5)
Smear Review: NORMAL
WBC Count: 3.9 10*3/uL — ABNORMAL LOW (ref 4.0–10.5)
nRBC: 0 % (ref 0.0–0.2)

## 2023-10-22 LAB — RETICULOCYTES
Immature Retic Fract: 11.8 % (ref 2.3–15.9)
RBC.: 3.44 MIL/uL — ABNORMAL LOW (ref 4.22–5.81)
Retic Count, Absolute: 56.8 10*3/uL (ref 19.0–186.0)
Retic Ct Pct: 1.7 % (ref 0.4–3.1)

## 2023-10-22 LAB — CMP (CANCER CENTER ONLY)
ALT: 54 U/L — ABNORMAL HIGH (ref 0–44)
AST: 56 U/L — ABNORMAL HIGH (ref 15–41)
Albumin: 3.7 g/dL (ref 3.5–5.0)
Alkaline Phosphatase: 83 U/L (ref 38–126)
Anion gap: 11 (ref 5–15)
BUN: 72 mg/dL — ABNORMAL HIGH (ref 8–23)
CO2: 20 mmol/L — ABNORMAL LOW (ref 22–32)
Calcium: 8.5 mg/dL — ABNORMAL LOW (ref 8.9–10.3)
Chloride: 103 mmol/L (ref 98–111)
Creatinine: 3.43 mg/dL — ABNORMAL HIGH (ref 0.61–1.24)
GFR, Estimated: 16 mL/min — ABNORMAL LOW (ref >60)
Glucose, Bld: 125 mg/dL — ABNORMAL HIGH (ref 70–99)
Potassium: 4.1 mmol/L (ref 3.5–5.1)
Sodium: 134 mmol/L — ABNORMAL LOW (ref 135–145)
Total Bilirubin: 0.5 mg/dL (ref 0.0–1.2)
Total Protein: 7.5 g/dL (ref 6.5–8.1)

## 2023-10-22 LAB — FERRITIN: Ferritin: 477 ng/mL — ABNORMAL HIGH (ref 24–336)

## 2023-10-22 MED ORDER — DARBEPOETIN ALFA 300 MCG/0.6ML IJ SOSY
300.0000 ug | PREFILLED_SYRINGE | Freq: Once | INTRAMUSCULAR | Status: AC
  Administered 2023-10-22: 300 ug via SUBCUTANEOUS
  Filled 2023-10-22: qty 0.6

## 2023-10-22 NOTE — Patient Instructions (Signed)

## 2023-10-22 NOTE — Progress Notes (Signed)
 Hematology and Oncology Follow Up Visit  [name] [id number] Jun 02, 1928 89 y.o. 10/22/2023   Principle Diagnosis:  Erythropoietin  deficiency anemia Iron deficiency anemia Chronic atrial fibrillation  Current Therapy:   IV iron as indicated --Ferrlecit given on 04/21/2023  Aranesp  300 g sq prn hemoglobin < 11 - last dose given on  09/19/2021 Eliquis  2.5 mg p.o. twice daily   Interim History:  [name] is here today for follow-up.  We last saw him back in May.  At that time, he did receive a dose of Aranesp .  I do see him in church every now that.  Because of he is wife's age, sometimes they just cannot make it to church.  He has had no cardiac issues.  He has had no exacerbations of heart failure.  He is on Eliquis .  He has chronic atrial fibrillation.  When we last saw him, his ferritin was 732 with an iron saturation of 35%.  He has had no issues with nausea or vomiting.  Baby is still getting intravesicular BCG.  This is being done by [facility].  Overall, I would have said that his performance status is probably ECOG 2-3.     Medications:  Allergies as of 10/22/2023       Reactions   Lisinopril  Other (See Comments)    he has difficulty controlling potassium on ace/arb- lisinopril  was dc'd in the past 2/2 to hyperkalemia documented by [facility] P[facility] 01/2021   Penicillins Hives   Has patient had a PCN reaction causing immediate rash, facial/tongue/throat swelling, SOB or lightheadedness with hypotension: No Has patient had a PCN reaction causing severe rash involving mucus membranes or skin necrosis: Yes Has patient had a PCN reaction that required hospitalization: No Has patient had a PCN reaction occurring within the last 10 years: No If all of the above answers are NO, then may proceed with Cephalosporin use.   Vancomycin  Other (See Comments)   Edema and myalgia        Medication List        Accurate as of October 22, 2023  3:09 PM. If you have  any questions, ask your nurse or doctor.          allopurinol  100 MG tablet Commonly known as: ZYLOPRIM  Take 1 tablet (100 mg total) by mouth daily.   amLODipine  5 MG tablet Commonly known as: NORVASC  Take 1 tablet (5 mg total) by mouth daily.   atorvastatin  20 MG tablet Commonly known as: LIPITOR Take 1 tablet (20 mg total) by mouth at bedtime.   carvedilol  6.25 MG tablet Commonly known as: COREG  Take 1 tablet (6.25 mg total) by mouth 2 (two) times daily with a meal.   Cholecalciferol  125 MCG (5000 UT) capsule Take 5,000 Units by mouth daily.   Darbepoetin Alfa  300 MCG/0.6ML Sosy injection Commonly known as: ARANESP  Inject 300 mcg into the skin once.   dicyclomine  10 MG capsule Commonly known as: BENTYL  Take 1 capsule (10 mg total) by mouth every 6 (six) hours as needed.   Eliquis  2.5 MG Tabs tablet Generic drug: apixaban  TAKE 1 TABLET BY MOUTH TWICE A DAY   Feraheme  510 MG/17ML Soln injection Generic drug: ferumoxytol  Inject 510 mg into the vein as needed.   furosemide  40 MG tablet Commonly known as: LASIX  TAKE 1 TABLET IN THE MORNING AND TAKE ONE-HALF (1/2) TABLET IN THE EVENING   latanoprost  0.005 % ophthalmic solution Commonly known as: XALATAN  Place 1 drop into both eyes at bedtime.  onetouch ultrasoft lancets Use to check sugar daily as instructed.   OneTouch Verio test strip Generic drug: glucose blood Use to check sugar daily as instructed.   OVER THE COUNTER MEDICATION Take 1 tablet by mouth 2 (two) times daily. MitoQ   polyethylene glycol 17 g packet Commonly known as: MiraLax  Take 17 g by mouth daily.   Potassium Chloride  ER 20 MEQ Tbcr Take 1 tablet (20 mEq total) by mouth 2 (two) times daily.   PreserVision AREDS 2 Caps Take 1 capsule by mouth 2 (two) times daily.   tamsulosin  0.4 MG Caps capsule Commonly known as: FLOMAX  Take 0.8 mg by mouth at bedtime.   timolol  0.5 % ophthalmic solution Commonly known as: TIMOPTIC  Place 1  drop into both eyes 2 (two) times daily.        Allergies:  Allergies  Allergen Reactions   Lisinopril  Other (See Comments)     he has difficulty controlling potassium on ace/arb- lisinopril  was dc'd in the past 2/2 to hyperkalemia documented by [facility] P[facility] 01/2021   Penicillins Hives    Has patient had a PCN reaction causing immediate rash, facial/tongue/throat swelling, SOB or lightheadedness with hypotension: No Has patient had a PCN reaction causing severe rash involving mucus membranes or skin necrosis: Yes Has patient had a PCN reaction that required hospitalization: No Has patient had a PCN reaction occurring within the last 10 years: No If all of the above answers are NO, then may proceed with Cephalosporin use.    Vancomycin  Other (See Comments)    Edema and myalgia    Past Medical History, Surgical history, Social history, and Family History were reviewed and updated.  Review of Systems: Review of Systems  Constitutional: Negative.   HENT: Negative.    Eyes: Negative.   Respiratory: Negative.    Cardiovascular:  Positive for palpitations.  Gastrointestinal: Negative.   Genitourinary: Negative.   Musculoskeletal: Negative.   Skin: Negative.   Neurological: Negative.   Endo/Heme/Allergies: Negative.   Psychiatric/Behavioral: Negative.       Physical Exam: Vital signs show temperature of 97.4.  Pulse 60.  Blood pressure 127/51.  Weight is 143 pounds.    Wt Readings from Last 3 Encounters:  10/22/23 143 lb (64.9 kg)  09/25/23 141 lb (64 kg)  08/18/23 141 lb 3.2 oz (64 kg)    Physical Exam Vitals reviewed.  HENT:     Head: Normocephalic and atraumatic.   Eyes:     Pupils: Pupils are equal, round, and reactive to light.    Cardiovascular:     Rate and Rhythm: Normal rate and regular rhythm.     Heart sounds: Normal heart sounds.  Pulmonary:     Effort: Pulmonary effort is normal.     Breath sounds: Normal breath sounds.  Abdominal:     General:  Bowel sounds are normal.     Palpations: Abdomen is soft.   Musculoskeletal:        General: No tenderness or deformity. Normal range of motion.     Cervical back: Normal range of motion.  Lymphadenopathy:     Cervical: No cervical adenopathy.   Skin:    General: Skin is warm and dry.     Findings: No erythema or rash.   Neurological:     Mental Status: He is alert and oriented to person, place, and time.   Psychiatric:        Behavior: Behavior normal.        Thought Content: Thought content  normal.        Judgment: Judgment normal.      Lab Results  Component Value Date   WBC 3.9 (L) 10/22/2023   HGB 10.8 (L) 10/22/2023   HCT 32.9 (L) 10/22/2023   MCV 95.6 10/22/2023   PLT 111 (L) 10/22/2023   Lab Results  Component Value Date   FERRITIN 732 (H) 09/25/2023   IRON 90 09/25/2023   TIBC 255 09/25/2023   UIBC 165 09/25/2023   IRONPCTSAT 35 09/25/2023   Lab Results  Component Value Date   RETICCTPCT 1.7 10/22/2023   RBC 3.44 (L) 10/22/2023   RBC 3.44 (L) 10/22/2023   No results found for: KPAFRELGTCHN, LAMBDASER, KAPLAMBRATIO No results found for: KIMBERLY LE, IGMSERUM Lab Results  Component Value Date   TOTALPROTELP 6.0 (L) 12/26/2015   ALBUMINELP 3.4 (L) 12/26/2015   A1GS 0.4 (H) 12/26/2015   A2GS 0.8 12/26/2015   BETS 0.4 12/26/2015   BETA2SER 0.3 12/26/2015   GAMS 0.8 12/26/2015   MSPIKE Not Observed 01/29/2016   SPEI SEE NOTE 12/26/2015     Chemistry      Component Value Date/Time   NA 138 09/25/2023 1442   NA 139 05/29/2023 0000   NA 145 04/09/2017 0905   NA 140 03/28/2016 0853   K 3.7 09/25/2023 1442   K 4.5 04/09/2017 0905   K 4.4 03/28/2016 0853   CL 106 09/25/2023 1442   CL 105 04/09/2017 0905   CO2 21 (L) 09/25/2023 1442   CO2 27 04/09/2017 0905   CO2 19 (L) 03/28/2016 0853   BUN 76 (H) 09/25/2023 1442   BUN 62 (A) 05/29/2023 0000   BUN 33 (H) 04/09/2017 0905   BUN 34.4 (H) 03/28/2016 0853   CREATININE 3.34 (H)  09/25/2023 1442   CREATININE 2.35 (H) 01/23/2023 1430   CREATININE 1.7 (H) 03/28/2016 0853   GLU 128 05/29/2023 0000      Component Value Date/Time   CALCIUM  8.7 (L) 09/25/2023 1442   CALCIUM  9.2 04/09/2017 0905   CALCIUM  9.2 03/28/2016 0853   ALKPHOS 111 09/25/2023 1442   ALKPHOS 69 04/09/2017 0905   ALKPHOS 85 03/28/2016 0853   AST 95 (H) 09/25/2023 1442   AST 23 03/28/2016 0853   ALT 93 (H) 09/25/2023 1442   ALT 27 04/09/2017 0905   ALT 37 03/28/2016 0853   BILITOT 0.5 09/25/2023 1442   BILITOT 0.79 03/28/2016 0853      Impression and Plan: [name] is a very pleasant 89 yo caucasian gentleman with both erythropoietin  deficiency and iron deficiency anemia.   We will go ahead and give him Aranesp  today.  I will have to see what his iron levels look like.  I do not see a problem with him having the intravesicular BCG.  Thankfully, he has had no problems with his heart.  I would think this is going to be his biggest issue in the future is his congestive heart failure and atrial fibrillation.  We will plan to get him back to see us  in another month.  I would like to think that with Aranesp  today, his hemoglobin should be high enough when we see him back that he will not need another Aranesp  dose.        Maude JONELLE Crease, MD 6/26/20253:09 PM

## 2023-10-23 ENCOUNTER — Ambulatory Visit: Payer: Self-pay | Admitting: Hematology & Oncology

## 2023-10-23 DIAGNOSIS — C678 Malignant neoplasm of overlapping sites of bladder: Secondary | ICD-10-CM | POA: Diagnosis not present

## 2023-10-23 LAB — IRON AND IRON BINDING CAPACITY (CC-WL,HP ONLY)
Iron: 64 ug/dL (ref 45–182)
Saturation Ratios: 23 % (ref 17.9–39.5)
TIBC: 280 ug/dL (ref 250–450)
UIBC: 216 ug/dL (ref 117–376)

## 2023-10-26 NOTE — Progress Notes (Signed)
 Office Visit    Patient Name: [name] Date of Encounter: 11/02/2023  PCP:  Catherine Charlies LABOR DO   [facility]  Cardiologist:  Maude Emmer, MD  Advanced Practice Provider:  No care team member to display  HPI    [name] is a 89 y.o. male with a past medical history of CAD status post CABG in 1999 (LIMA to LAD, SVG to IM, sequential SVG to OM1, OM 2, SVG to D1, SVG to PDA), CHB status post [facility] Jude PPM in 2019, PAF, anemia on aranesp  and iron infusions chronically presents today for follow-up appointment.  He was seen by Dr. Nguyen 05/2021 and at that visit was doing well.  No complaints of shortness of breath or chest pain.  TTE was completed 11/20/2020 with EF 40 to 45% and severe biatrial enlargement with moderate MR.  Shared decision given patient's age and renal failure that cardiac cath would not be pursued.  He was admitted 09/16/2021 for heart failure exacerbation.  Presented with shortness of breath and was admitted for diuresis with a chest x-ray noted small effusion, pulmonary vascular congestion, BNP 1700, creatinine was 2.4.  Lost 6.2 L.  Discharged in stable condition after 2 days with oral prescription 40 mg of Lasix  as needed.  No further medication changes made at that time.  Hospital 05/12/22 through 05/17/2022 and had 7 L of fluid pulled off of him.  He was on IV Lasix .  He was admitted for CHF exacerbation.  He is on daily Lasix  40 mg, can take an extra 40 mg if he weighs over 3 pounds in 1 night or 5 pounds in a week.  He is only had to do this twice since admission.  He weighs every morning and today weight 144.8 pounds at home on his scale.  He does request he can stay away from salt.  He states that the vein was taken out of his right leg for his CABG back in 1999 so that likely was small is a little bit more than the other.  We discussed elastic therapy in [place] for compression. Currently on lasix  40 mg bid   Sees Dr Timmy for  anemia with periodic iron infusions as well as Aranesp   . Sees renal and Cr stable Seeing [facility] for basal cell on top of his head   PPM set VVI with permanent afib. St Jude device followed by [employer]  Married 81 years Son with him today He is on low dose eliquis  for stroke prophylaxis   Has bladder cancer. Had TURBT with left uretal stent Dr Shane 08/18/23  No cardiac issues.    Past Medical History    Past Medical History:  Diagnosis Date   A-fib [facility])    Basal cell carcinoma    skin   Bigeminal rhythm    Bradycardia 06/21/2017   Cancer of anterior wall of urinary bladder (HCC) 07/18/2019   Cataract    Chronic renal insufficiency, stage 3 (moderate) (HCC) 2018   GFR 30s-40s   Coronary artery disease    post bypass   CVD (cardiovascular disease)    Diabetes mellitus without complication (HCC)    type 2 diet controlled   Diverticular disease    Easy bruising    Erythropoietin  deficiency anemia 02/04/2016   Family history of adverse reaction to anesthesia    [facility] PONV   Gout    Hematuria 06/30/2019   Hernia    Hyperkalemia 05/06/2017  Hyperlipidemia    Hypertension    IBS (irritable bowel syndrome)    Iron deficiency anemia 02/04/2016   Macular degeneration    Microscopic colitis    PVD (peripheral vascular disease) (HCC)    Past Surgical History:  Procedure Laterality Date   CARDIAC CATHETERIZATION  2006   CARDIOVERSION N/A 02/22/2013   Procedure: CARDIOVERSION;  Surgeon: Maude JAYSON Emmer, MD;  Location: [facility] ENDOSCOPY;  Service: Cardiovascular;  Laterality: N/A;   CARDIOVERSION N/A 11/15/2014   Procedure: CARDIOVERSION;  Surgeon: Maude JAYSON Emmer, MD;  Location: [facility] ENDOSCOPY;  Service: Cardiovascular;  Laterality: N/A;   CARDIOVERSION N/A 04/01/2017   Procedure: CARDIOVERSION;  Surgeon: Rolan Ezra RAMAN, MD;  Location: [facility] ENDOSCOPY;  Service: Cardiovascular;  Laterality: N/A;   CAROTID ENDARTERECTOMY  2009/ 1993   left/ right   COLONOSCOPY  03/17/2005    The colon is normal.   CORONARY ARTERY BYPASS GRAFT  1999   CYSTOSCOPY W/ RETROGRADES Bilateral 07/14/2019   Procedure: CYSTOSCOPY WITH RETROGRADE PYELOGRAM;  Surgeon: Matilda Senior, MD;  Location[facility];  Service: Urology;  Laterality: Bilateral;   ESOPHAGOGASTRODUODENOSCOPY  03/17/2005   Normal esophagus. Normal Stomanch. Normal duodenum.    EYE SURGERY     eyelid repair   INGUINAL HERNIA REPAIR  02/11/2012   Procedure: LAPAROSCOPIC BILATERAL INGUINAL HERNIA REPAIR;  Surgeon: Donnice KATHEE Lunger, MD;  Location: [facility];  Service: General;  Laterality: Bilateral;   PACEMAKER IMPLANT N/A 07/28/2017   St Jude Medical Assurity MRI conditional  dual-chamber pacemaker for symptomatic second degree AV block by Dr Kelsie   SKIN CANCER EXCISION     right ear x 3   TRANSURETHRAL RESECTION OF BLADDER TUMOR N/A 09/05/2019   Procedure: TRANSURETHRAL RESECTION OF BLADDER TUMOR (TURBT);  Surgeon: Matilda Senior, MD;  Location: [facility];  Service: Urology;  Laterality: N/A;   TRANSURETHRAL RESECTION OF BLADDER TUMOR WITH MITOMYCIN -C N/A 07/14/2019   Procedure: TRANSURETHRAL RESECTION OF BLADDER TUMOR WITH GEMCITABINE  IN PACU;  Surgeon: Matilda Senior, MD;  Location: [facility];  Service: Urology;  Laterality: N/A;    Allergies  Allergies  Allergen Reactions   Lisinopril  Other (See Comments)     he has difficulty controlling potassium on ace/arb- lisinopril  was dc'd in the past 2/2 to hyperkalemia documented by [facility] PCP 01/2021   Penicillins Hives    Has patient had a PCN reaction causing immediate rash, facial/tongue/throat swelling, SOB or lightheadedness with hypotension: No Has patient had a PCN reaction causing severe rash involving mucus membranes or skin necrosis: Yes Has patient had a PCN reaction that required hospitalization: No Has patient had a PCN reaction occurring within the last 10 years: No If all of the above answers  are NO, then may proceed with Cephalosporin use.    Vancomycin  Other (See Comments)    Edema and myalgia   EKGs/Labs/Other Studies Reviewed:   The following studies were reviewed today:  Echocardiogram 08/27/22 IMPRESSIONS   1. Left ventricular ejection fraction, by estimation, is 50 to 55%. The  left ventricle has low normal function. The left ventricle has no regional  wall motion abnormalities. There is mild left ventricular hypertrophy.  Left ventricular diastolic  parameters are indeterminate. The interventricular septum is mildly  flattened in diastole ('D' shaped left ventricle), consistent with right  ventricular volume overload. Apical dyssynchrony due to pacing.   2. Right ventricular systolic function is mildly reduced. The right  ventricular size is mildly enlarged. There is moderately elevated  pulmonary  artery systolic pressure. The estimated right ventricular  systolic pressure is 48.4 mmHg.   3. Tricuspid valve regurgitation is severe.   4. Pulmonic valve regurgitation is moderate.   5. Left atrial size was severely dilated.   6. Right atrial size was mild to moderately dilated.   7. The mitral valve is degenerative. Mild to moderate mitral valve  regurgitation. No evidence of mitral stenosis.   8. The aortic valve is grossly normal. Aortic valve regurgitation is  trivial. No aortic stenosis is present.   9. There is mild (Grade II) plaque involving the aortic root.  10. The inferior vena cava is dilated in size with <50% respiratory  variability, suggesting right atrial pressure of 15 mmHg.  11. Evidence of atrial level shunting detected by color flow Doppler.  There is a moderately sized patent foramen ovale with predominantly left  to right shunting across the atrial septum.    EKG:  afib with V pacing rate 61 04/23/23   Recent Labs: 01/06/2023: Pro B Natriuretic peptide (BNP) 1,570.0 04/18/2023: B Natriuretic Peptide 921.6 04/20/2023: Magnesium   2.2 10/22/2023: ALT 54; BUN 72; Creatinine 3.43; Hemoglobin 10.8; Platelet Count 111; Potassium 4.1; Sodium 134  Recent Lipid Panel    Component Value Date/Time   CHOL 125 11/24/2019 1138   TRIG 67 11/24/2019 1138   HDL 57 11/24/2019 1138   CHOLHDL 2.2 11/24/2019 1138   VLDL 9 06/21/2017 0420   LDLCALC 54 11/24/2019 1138   LDLDIRECT 64.0 01/06/2023 1341     Home Medications   Current Meds  Medication Sig   allopurinol  (ZYLOPRIM ) 100 MG tablet Take 1 tablet (100 mg total) by mouth daily.   amLODipine  (NORVASC ) 5 MG tablet Take 1 tablet (5 mg total) by mouth daily.   atorvastatin  (LIPITOR) 20 MG tablet Take 1 tablet (20 mg total) by mouth at bedtime.   carvedilol  (COREG ) 6.25 MG tablet Take 1 tablet (6.25 mg total) by mouth 2 (two) times daily with a meal.   Cholecalciferol  125 MCG (5000 UT) capsule Take 5,000 Units by mouth daily.   dicyclomine  (BENTYL ) 10 MG capsule Take 1 capsule (10 mg total) by mouth every 6 (six) hours as needed.   ELIQUIS  2.5 MG TABS tablet TAKE 1 TABLET BY MOUTH TWICE A DAY   furosemide  (LASIX ) 40 MG tablet Take 40 mg by mouth 2 (two) times daily.   glucose blood (ONETOUCH VERIO) test strip Use to check sugar daily as instructed.   Lancets (ONETOUCH ULTRASOFT) lancets Use to check sugar daily as instructed.   latanoprost  (XALATAN ) 0.005 % ophthalmic solution Place 1 drop into both eyes at bedtime.   Multiple Vitamins-Minerals (PRESERVISION AREDS 2) CAPS Take 1 capsule by mouth 2 (two) times daily.   OVER THE COUNTER MEDICATION Take 1 tablet by mouth 2 (two) times daily. MitoQ   polyethylene glycol (MIRALAX ) 17 g packet Take 17 g by mouth daily.   Potassium Chloride  ER 20 MEQ TBCR Take 1 tablet (20 mEq total) by mouth 2 (two) times daily.   tamsulosin  (FLOMAX ) 0.4 MG CAPS capsule Take 0.8 mg by mouth at bedtime.   timolol  (TIMOPTIC ) 0.5 % ophthalmic solution Place 1 drop into both eyes 2 (two) times daily.    [DISCONTINUED] Darbepoetin Alfa  (ARANESP ) 300  MCG/0.6ML SOSY injection Inject 300 mcg into the skin once.   [DISCONTINUED] furosemide  (LASIX ) 40 MG tablet TAKE 1 TABLET IN THE MORNING AND TAKE ONE-HALF (1/2) TABLET IN THE EVENING     Review of Systems  All other systems reviewed and are otherwise negative except as noted above.  Physical Exam    VS:  BP (!) 110/50   Pulse 63   Ht 5' 7 (1.702 m)   Wt 145 lb 9.6 oz (66 kg)   SpO2 95%   BMI 22.80 kg/m  , BMI Body mass index is 22.8 kg/m.  Wt Readings from Last 3 Encounters:  11/02/23 145 lb 9.6 oz (66 kg)  10/28/23 138 lb (62.6 kg)  10/22/23 143 lb (64.9 kg)    Affect appropriate Elderly male  HEENT: recurrent basal cell right ear  Neck supple with no adenopathy JVP elevated from TR no bruits no thyromegaly Lungs clear with no wheezing and good diaphragmatic motion Heart:  S1/S2 MR  murmur, no rub, gallop or click PMI normal  PPM under left clavicile  Abdomen: benighn, BS positve, no tenderness, no AAA no bruit.  No HSM or HJR Distal pulses intact with no bruits No edema Neuro non-focal Prior basal cell surgery scalp  No muscular weakness   Assessment & Plan    CAD status post CABG -continue statin ASA and beta blocker  -no chest pain or increased SOB - given age and lack of chest pain no indication for stress testing   Carotid artery disease -left bruit on exam -duplex 07/01/22 plaque no stenosis   Atrial fibrillation on chronic anticoagulation -Continue Eliquis  2.5 mg twice a day and coreg  -He is in rate controlled A-fib today  CHF   -Mild lower extremity edema today, significantly better -Dry weight appears to be 142 pounds -he has severe TR likely due to PPM leads - TTE 08/27/22 50-55% with mild/mod MR and severe TR   PPM -no change, stable St Jude 100% V pacing F/U [facility]   6.  Anemia:  f/u Dr Timmy erythropoietin  and iron as needed for Hb < 11 Most recent Hct 32.9 10/22/23   7.  CRF:  Baseline Cr now up to 3.43 f/u Nephrology  8. Derm:   large basal cell on scalp seeing Mohs surgeon at [facility] Recurrent basal cell on rigtht ear to see Bard Molt soon   9. Bladder Cancer: f/u urology post TURBT 08/18/23     F/U in 6 months   Signed, Maude Emmer, MD 11/02/2023, 9:28 AM [facility]

## 2023-10-27 ENCOUNTER — Ambulatory Visit: Payer: Medicare Other

## 2023-10-27 DIAGNOSIS — I442 Atrioventricular block, complete: Secondary | ICD-10-CM | POA: Diagnosis not present

## 2023-10-27 LAB — CUP PACEART REMOTE DEVICE CHECK
Battery Remaining Longevity: 47 mo
Battery Remaining Percentage: 41 %
Battery Voltage: 2.98 V
Brady Statistic RV Percent Paced: 99 %
Date Time Interrogation Session: 20250701020013
Implantable Lead Connection Status: 753985
Implantable Lead Connection Status: 753985
Implantable Lead Implant Date: 20190402
Implantable Lead Implant Date: 20190402
Implantable Lead Location: 753859
Implantable Lead Location: 753860
Implantable Pulse Generator Implant Date: 20190402
Lead Channel Impedance Value: 430 Ohm
Lead Channel Pacing Threshold Amplitude: 0.5 V
Lead Channel Pacing Threshold Pulse Width: 0.5 ms
Lead Channel Sensing Intrinsic Amplitude: 12 mV
Lead Channel Setting Pacing Amplitude: 2.5 V
Lead Channel Setting Pacing Pulse Width: 0.5 ms
Lead Channel Setting Sensing Sensitivity: 2 mV
Pulse Gen Model: 2272
Pulse Gen Serial Number: 9003454

## 2023-10-28 ENCOUNTER — Ambulatory Visit: Admitting: *Deleted

## 2023-10-28 ENCOUNTER — Ambulatory Visit: Payer: Self-pay | Admitting: Cardiology

## 2023-10-28 VITALS — Ht 67.0 in | Wt 138.0 lb

## 2023-10-28 DIAGNOSIS — Z0000 Encounter for general adult medical examination without abnormal findings: Secondary | ICD-10-CM

## 2023-10-28 NOTE — Progress Notes (Signed)
 Subjective:   [name] is a 89 y.o. male who presents for Medicare Annual/Subsequent preventive examination.  Visit Complete: Virtual I connected with  [name] on 10/28/23 by a audio enabled telemedicine application and verified that I am speaking with the correct person using two identifiers.  Patient Location: Home  Provider Location: Home Office  I discussed the limitations of evaluation and management by telemedicine. The patient expressed understanding and agreed to proceed.  Vital Signs: Because this visit was a virtual/telehealth visit, some criteria may be missing or patient reported. Any vitals not documented were not able to be obtained and vitals that have been documented are patient reported.    Cardiac Risk Factors include: advanced age (>22men, >56 women);diabetes mellitus;male gender;hypertension     Objective:    Today's Vitals   10/28/23 1323  Weight: 138 lb (62.6 kg)  Height: 5' 7 (1.702 m)   Body mass index is 21.61 kg/m.     10/28/2023    1:14 PM 10/22/2023    2:46 PM 09/25/2023    3:13 PM 08/14/2023    2:16 PM 08/06/2023    1:32 PM 07/13/2023    1:51 PM 05/27/2023    1:33 PM  Advanced Directives  Does Patient Have a Medical Advance Directive? Yes Yes Yes Yes Yes Yes Yes  Type of Advance Directive Healthcare Power of Attorney Living will Living will;Healthcare Power of [employer] Power of [place];Living will Healthcare Power of Attorney Living will;Healthcare Power of [employer] Power of [place];Living will  Does patient want to make changes to medical advance directive?  No - Patient declined No - Patient declined No - Patient declined  No - Patient declined No - Patient declined  Copy of Healthcare Power of Attorney in Chart? No - copy requested   No - copy requested No - copy requested No - copy requested No - copy requested    Current Medications (verified) Outpatient Encounter Medications as of 10/28/2023   Medication Sig   allopurinol  (ZYLOPRIM ) 100 MG tablet Take 1 tablet (100 mg total) by mouth daily.   amLODipine  (NORVASC ) 5 MG tablet Take 1 tablet (5 mg total) by mouth daily.   atorvastatin  (LIPITOR) 20 MG tablet Take 1 tablet (20 mg total) by mouth at bedtime.   carvedilol  (COREG ) 6.25 MG tablet Take 1 tablet (6.25 mg total) by mouth 2 (two) times daily with a meal.   Cholecalciferol  125 MCG (5000 UT) capsule Take 5,000 Units by mouth daily.   Darbepoetin Alfa  (ARANESP ) 300 MCG/0.6ML SOSY injection Inject 300 mcg into the skin once.   dicyclomine  (BENTYL ) 10 MG capsule Take 1 capsule (10 mg total) by mouth every 6 (six) hours as needed.   ELIQUIS  2.5 MG TABS tablet TAKE 1 TABLET BY MOUTH TWICE A DAY   ferumoxytol  (FERAHEME ) 510 MG/17ML SOLN injection Inject 510 mg into the vein as needed.   furosemide  (LASIX ) 40 MG tablet TAKE 1 TABLET IN THE MORNING AND TAKE ONE-HALF (1/2) TABLET IN THE EVENING   glucose blood (ONETOUCH VERIO) test strip Use to check sugar daily as instructed.   Lancets (ONETOUCH ULTRASOFT) lancets Use to check sugar daily as instructed.   latanoprost  (XALATAN ) 0.005 % ophthalmic solution Place 1 drop into both eyes at bedtime.   Multiple Vitamins-Minerals (PRESERVISION AREDS 2) CAPS Take 1 capsule by mouth 2 (two) times daily.   OVER THE COUNTER MEDICATION Take 1 tablet by mouth 2 (two) times daily. MitoQ   polyethylene glycol (MIRALAX ) 17  g packet Take 17 g by mouth daily.   Potassium Chloride  ER 20 MEQ TBCR Take 1 tablet (20 mEq total) by mouth 2 (two) times daily.   tamsulosin  (FLOMAX ) 0.4 MG CAPS capsule Take 0.8 mg by mouth at bedtime.   timolol  (TIMOPTIC ) 0.5 % ophthalmic solution Place 1 drop into both eyes 2 (two) times daily.    No facility-administered encounter medications on file as of 10/28/2023.    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   Family history of adverse reaction to anesthesia    daugther PONV   Gout    Hematuria 06/30/2019   Hernia    Hyperkalemia 05/06/2017   Hyperlipidemia    Hypertension    IBS (irritable bowel syndrome)    Iron deficiency anemia 02/04/2016   Macular degeneration    Microscopic colitis    PVD (peripheral vascular disease) (HCC)    Past Surgical History:  Procedure Laterality Date   CARDIAC CATHETERIZATION  2006   CARDIOVERSION N/A 02/22/2013   Procedure: CARDIOVERSION;  Surgeon: Maude JAYSON Emmer, MD;  Location[facility] ENDOSCOPY;  Service: Cardiovascular;  Laterality: N/A;   CARDIOVERSION N/A 11/15/2014   Procedure: CARDIOVERSION;  Surgeon: Maude JAYSON Emmer, MD;  Location: [facility] ENDOSCOPY;  Service: Cardiovascular;  Laterality: N/A;   CARDIOVERSION N/A 04/01/2017   Procedure: CARDIOVERSION;  Surgeon: Rolan Ezra RAMAN, MD;  Location: [facility] ENDOSCOPY;  Service: Cardiovascular;  Laterality: N/A;   CAROTID ENDARTERECTOMY  2009/ 1993   left/ right   COLONOSCOPY  03/17/2005   The colon is normal.   CORONARY ARTERY BYPASS GRAFT  1999   CYSTOSCOPY W/ RETROGRADES Bilateral 07/14/2019   Procedure: CYSTOSCOPY WITH RETROGRADE PYELOGRAM;  Surgeon: Matilda Senior, MD;  Location: [facility];  Service: Urology;  Laterality: Bilateral;   ESOPHAGOGASTRODUODENOSCOPY  03/17/2005   Normal esophagus. Normal Stomanch. Normal duodenum.    EYE SURGERY     eyelid repair   INGUINAL HERNIA REPAIR  02/11/2012   Procedure: LAPAROSCOPIC BILATERAL INGUINAL HERNIA REPAIR;   Surgeon: Donnice KATHEE Lunger, MD;  Location: [facility];  Service: General;  Laterality: Bilateral;   PACEMAKER IMPLANT N/A 07/28/2017   St Jude Medical Assurity MRI conditional  dual-chamber pacemaker for symptomatic second degree AV block by Dr Kelsie   SKIN CANCER EXCISION     right ear x 3   TRANSURETHRAL RESECTION OF BLADDER TUMOR N/A 09/05/2019   Procedure: TRANSURETHRAL RESECTION OF BLADDER TUMOR (TURBT);  Surgeon: Matilda Senior, MD;  Location: [facility];  Service: Urology;  Laterality: N/A;   TRANSURETHRAL RESECTION OF BLADDER TUMOR WITH MITOMYCIN -C N/A 07/14/2019   Procedure: TRANSURETHRAL RESECTION OF BLADDER TUMOR WITH GEMCITABINE  IN PACU;  Surgeon: Matilda Senior, MD;  Location: [facility];  Service: Urology;  Laterality: N/A;   Family History  Problem Relation Age of Onset   Stroke Mother    Coronary artery disease  Father    Heart disease Father    Melanoma Sister    Colon cancer Neg Hx    Social History   Socioeconomic History   Marital status: Married    Spouse name: Not on file   Number of children: Not on file   Years of education: Not on file   Highest education level: Not on file  Occupational History   Occupation: retired  Tobacco Use   Smoking status: Former   Smokeless tobacco: Former    Types: Chew    Quit date: 02/03/1969  Vaping Use   Vaping status: Never Used  Substance and Sexual Activity   Alcohol use: No    Alcohol/week: 0.0 standard drinks of alcohol   Drug use: No   Sexual activity: Not Currently  Other Topics Concern   Not on file  Social History Narrative   Married to Coleman, 2 children Curtistine and [name].   Some college. Retired from [employer].   Drinks caffeine, uses herbal remedies, takes a daily vitamin.   Wears his seatbelt, smoke detector at home, firearms in the home.   Wears a hearing aid.   Feels safe in her relationships.   Social Drivers of Corporate investment banker Strain: Low Risk   (10/28/2023)   Overall Financial Resource Strain (CARDIA)    Difficulty of Paying Living Expenses: Not hard at all  Food Insecurity: No Food Insecurity (10/28/2023)   Hunger Vital Sign    Worried About Running Out of Food in the Last Year: Never true    Ran Out of Food in the Last Year: Never true  Transportation Needs: No Transportation Needs (10/28/2023)   PRAPARE - Administrator, Civil Service (Medical): No    Lack of Transportation (Non-Medical): No  Physical Activity: Insufficiently Active (10/28/2023)   Exercise Vital Sign    Days of Exercise per Week: 3 days    Minutes of Exercise per Session: 20 min  Stress: No Stress Concern Present (10/28/2023)   [employer] of Occupational Health - Occupational Stress Questionnaire    Feeling of Stress: Not at all  Social Connections: Socially Integrated (10/28/2023)   Social Connection and Isolation Panel    Frequency of Communication with Friends and Family: Twice a week    Frequency of Social Gatherings with Friends and Family: More than three times a week    Attends Religious Services: More than 4 times per year    Active Member of [employer] or Organizations: Yes    Attends Engineer, structural: More than 4 times per year    Marital Status: Married    Tobacco Counseling Counseling given: Not Answered   Clinical Intake:  Pre-visit preparation completed: Yes  Pain : No/denies pain     Diabetes: Yes CBG done?: No Did pt. bring in CBG monitor from home?: No  How often do you need to have someone help you when you read instructions, pamphlets, or other written materials from your doctor or pharmacy?: 1 - Never  Interpreter Needed?: No  Information entered by :: Mliss Graff LPN   Activities of Daily Living    10/28/2023    1:30 PM 08/06/2023    1:35 PM  In your present state of health, do you have any difficulty performing the following activities:  Hearing? 1   Vision? 1   Difficulty concentrating or making  decisions? 0   Walking or climbing stairs? 1   Dressing or bathing? 0   Doing errands,  shopping? 1 0  Preparing Food and eating ? N   Using the Toilet? N   In the past six months, have you accidently leaked urine? N   Do you have problems with loss of bowel control? N   Managing your Medications? Y   Managing your Finances? N   Housekeeping or managing your Housekeeping? Y     Patient Care Team: Catherine Charlies LABOR, DO as PCP - General (Family Medicine) Delford Maude BROCKS, MD as PCP - Cardiology (Cardiology) Kelsie Agent, MD (Inactive) as PCP - Electrophysiology (Cardiology) Patrcia Sharper, MD as Consulting Physician (Ophthalmology) Jadine Charleston, MD as Consulting Physician (Dermatology) Delford Maude BROCKS, MD as Consulting Physician (Cardiology) Jarold Mayo, MD as Consulting Physician (Ophthalmology) Timmy Maude SAUNDERS, MD as Consulting Physician (Oncology) Marlee Bernardino NOVAK, MD as Attending Physician (Nephrology) Joshua Blamer, MD as Consulting Physician (Dermatology) Matilda Senior, MD as Consulting Physician (Urology) Christine Rush, DPM as Consulting Physician (Podiatry)  Indicate any recent Medical Services you may have received from other than [facility] providers in the past year (date may be approximate).     Assessment:   This is a routine wellness examination for Arad.  Hearing/Vision screen Hearing Screening - Comments:: Bilatetral hearing aids Vision Screening - Comments:: [facility] Up to date Macular Degeneration  [place]   Goals Addressed             This Visit's Progress    Patient Stated       Continue current lifestyle       Depression Screen    10/28/2023    1:20 PM 06/24/2023   10:12 AM 09/17/2022    2:12 PM 07/22/2022    8:53 AM 05/29/2022    9:52 AM 07/12/2021    6:28 PM 07/12/2021    6:26 PM  PHQ 2/9 Scores  PHQ - 2 Score 0 0 0 0 0 0 0  PHQ- 9 Score 0          Fall Risk    10/28/2023    1:14 PM 06/24/2023   10:13 AM 09/17/2022    2:14 PM 07/22/2022     8:52 AM 07/12/2021    6:28 PM  Fall Risk   Falls in the past year? 1 0 1 0 1  Number falls in past yr: 1 0 0 0 0  Injury with Fall? 1 0 1 0 0  Comment   minor bruises    Risk for fall due to : Impaired balance/gait;Impaired mobility No Fall Risks Impaired vision    Follow up Falls evaluation completed;Education provided;Falls prevention discussed Falls evaluation completed Falls prevention discussed Falls evaluation completed Falls evaluation completed      Data saved with a previous flowsheet row definition    MEDICARE RISK AT HOME: Medicare Risk at Home Any stairs in or around the home?: No If so, are there any without handrails?: No Home free of loose throw rugs in walkways, pet beds, electrical cords, etc?: Yes Adequate lighting in your home to reduce risk of falls?: Yes Life alert?: No Use of a cane, walker or w/c?: Yes Grab bars in the bathroom?: Yes Shower chair or bench in shower?: Yes Elevated toilet seat or a handicapped toilet?: Yes  TIMED UP AND GO:  Was the test performed?  No    Cognitive Function:        10/28/2023    1:17 PM 09/17/2022    2:15 PM 07/12/2021    6:22 PM  6CIT Screen  What Year? 0 points  0 points 0 points  What month? 0 points 0 points 0 points  What time? 0 points 0 points 0 points  Count back from 20 0 points 0 points 0 points  Months in reverse 0 points 0 points 0 points  Repeat phrase 8 points 0 points 0 points  Total Score 8 points 0 points 0 points    Immunizations Immunization History  Administered Date(s) Administered   Fluad Quad(high Dose 65+) 01/10/2019, 02/11/2021, 01/20/2022   Fluad Trivalent(High Dose 65+) 01/06/2023   Influenza, High Dose Seasonal PF 12/11/2016, 12/31/2017   Influenza,inj,Quad PF,6+ Mos 02/07/2016   Influenza-Unspecified 01/08/2015, 12/11/2016, 01/18/2020   PFIZER(Purple Top)SARS-COV-2 Vaccination 06/23/2019, 07/26/2019, 01/26/2020   Zoster Recombinant(Shingrix) 02/13/2018, 04/14/2018    TDAP status:  Due, Education has been provided regarding the importance of this vaccine. Advised may receive this vaccine at local pharmacy or Health Dept. Aware to provide a copy of the vaccination record if obtained from local pharmacy or Health Dept. Verbalized acceptance and understanding.  Flu Vaccine status: Up to date  Pneumococcal vaccine status: Declined,  Education has been provided regarding the importance of this vaccine but patient still declined. Advised may receive this vaccine at local pharmacy or Health Dept. Aware to provide a copy of the vaccination record if obtained from local pharmacy or Health Dept. Verbalized acceptance and understanding.   Covid-19 vaccine status: Declined, Education has been provided regarding the importance of this vaccine but patient still declined. Advised may receive this vaccine at local pharmacy or Health Dept.or vaccine clinic. Aware to provide a copy of the vaccination record if obtained from local pharmacy or Health Dept. Verbalized acceptance and understanding.  Qualifies for Shingles Vaccine? No   Zostavax completed Yes   Shingrix Completed?: Yes  Screening Tests Health Maintenance  Topic Date Due   INFLUENZA VACCINE  11/27/2023   Medicare Annual Wellness (AWV)  10/27/2024   Zoster Vaccines- Shingrix  Completed   Hepatitis B Vaccines  Aged Out   HPV VACCINES  Aged Out   Meningococcal B Vaccine  Aged Out   DTaP/Tdap/Td  Discontinued   Pneumococcal Vaccine: 50+ Years  Discontinued   COVID-19 Vaccine  Discontinued    Health Maintenance  There are no preventive care reminders to display for this patient.   Colorectal cancer screening: No longer required.   Lung Cancer Screening: (Low Dose CT Chest recommended if Age 66-80 years, 20 pack-year currently smoking OR have quit w/in 15years.) does not qualify.   Lung Cancer Screening Referral:   Additional Screening:  Hepatitis C Screening: does not qualify;  Vision Screening: Recommended annual  ophthalmology exams for early detection of glaucoma and other disorders of the eye. Is the patient up to date with their annual eye exam?  Yes  Who is the provider or what is the name of the office in which the patient attends annual eye exams? [facility]     If pt is not established with a provider, would they like to be referred to a provider to establish care? No .   Dental Screening: Recommended annual dental exams for proper oral hygiene  Nutrition Risk Assessment:  Has the patient had any N/V/D within the last 2 months?  No  Does the patient have any non-healing wounds?  No  Has the patient had any unintentional weight loss or weight gain?  No   Diabetes:  Is the patient diabetic?  Yes  If diabetic, was a CBG obtained today?  No  Did the patient bring in  their glucometer from home?  No  How often do you monitor your CBG's? Every other day.   Financial Strains and Diabetes Management:  Are you having any financial strains with the device, your supplies or your medication? No .  Does the patient want to be seen by Chronic Care Management for management of their diabetes?  No  Would the patient like to be referred to a Nutritionist or for Diabetic Management?  No   Diabetic Exams:  Diabetic Eye Exam: Completed .Pt has been advised about the importance in completing this exam  Diabetic Foot Exam: . Pt has been advised about the importance in completing this exam. .    Community Resource Referral / Chronic Care Management: CRR required this visit?  No   CCM required this visit?  No     Plan:     I have personally reviewed and noted the following in the patient's chart:   Medical and social history Use of alcohol, tobacco or illicit drugs  Current medications and supplements including opioid prescriptions. Patient is not currently taking opioid prescriptions. Functional ability and status Nutritional status Physical activity Advanced directives List of other  physicians Hospitalizations, surgeries, and ER visits in previous 12 months Vitals Screenings to include cognitive, depression, and falls Referrals and appointments  In addition, I have reviewed and discussed with patient certain preventive protocols, quality metrics, and best practice recommendations. A written personalized care plan for preventive services as well as general preventive health recommendations were provided to patient.     Mliss Graff, LPN   06/04/7972   After Visit Summary: (MyChart) Due to this being a telephonic visit, the after visit summary with patients personalized plan was offered to patient via MyChart   Nurse Notes:

## 2023-10-28 NOTE — Patient Instructions (Signed)
 [name] , Thank you for taking time to come for your Medicare Wellness Visit. I appreciate your ongoing commitment to your health goals. Please review the following plan we discussed and let me know if I can assist you in the future.   Screening recommendations/referrals: Colonoscopy: no longer required Recommended yearly ophthalmology/optometry visit for glaucoma screening and checkup Recommended yearly dental visit for hygiene and checkup  Vaccinations: Influenza vaccine: up to date Pneumococcal vaccine:  Tdap vaccine: Education provided Shingles vaccine: up to date       Preventive Care 65 Years and Older, Male Preventive care refers to lifestyle choices and visits with your health care provider that can promote health and wellness. What does preventive care include? A yearly physical exam. This is also called an annual well check. Dental exams once or twice a year. Routine eye exams. Ask your health care provider how often you should have your eyes checked. Personal lifestyle choices, including: Daily care of your teeth and gums. Regular physical activity. Eating a healthy diet. Avoiding tobacco and drug use. Limiting alcohol use. Practicing safe sex. Taking low doses of aspirin  every day. Taking vitamin and mineral supplements as recommended by your health care provider. What happens during an annual well check? The services and screenings done by your health care provider during your annual well check will depend on your age, overall health, lifestyle risk factors, and family history of disease. Counseling  Your health care provider may ask you questions about your: Alcohol use. Tobacco use. Drug use. Emotional well-being. Home and relationship well-being. Sexual activity. Eating habits. History of falls. Memory and ability to understand (cognition). Work and work Astronomer. Screening  You may have the following tests or measurements: Height, weight, and  BMI. Blood pressure. Lipid and cholesterol levels. These may be checked every 5 years, or more frequently if you are over 43 years old. Skin check. Lung cancer screening. You may have this screening every year starting at age 25 if you have a 30-pack-year history of smoking and currently smoke or have quit within the past 15 years. Fecal occult blood test (FOBT) of the stool. You may have this test every year starting at age 3. Flexible sigmoidoscopy or colonoscopy. You may have a sigmoidoscopy every 5 years or a colonoscopy every 10 years starting at age 60. Prostate cancer screening. Recommendations will vary depending on your family history and other risks. Hepatitis C blood test. Hepatitis B blood test. Sexually transmitted disease (STD) testing. Diabetes screening. This is done by checking your blood sugar (glucose) after you have not eaten for a while (fasting). You may have this done every 1-3 years. Abdominal aortic aneurysm (AAA) screening. You may need this if you are a current or former smoker. Osteoporosis. You may be screened starting at age 87 if you are at high risk. Talk with your health care provider about your test results, treatment options, and if necessary, the need for more tests. Vaccines  Your health care provider may recommend certain vaccines, such as: Influenza vaccine. This is recommended every year. Tetanus, diphtheria, and acellular pertussis (Tdap, Td) vaccine. You may need a Td booster every 10 years. Zoster vaccine. You may need this after age 69. Pneumococcal 13-valent conjugate (PCV13) vaccine. One dose is recommended after age 72. Pneumococcal polysaccharide (PPSV23) vaccine. One dose is recommended after age 71. Talk to your health care provider about which screenings and vaccines you need and how often you need them. This information is not intended to replace  advice given to you by your health care provider. Make sure you discuss any questions you have  with your health care provider. Document Released: 05/11/2015 Document Revised: 01/02/2016 Document Reviewed: 02/13/2015 Elsevier Interactive Patient Education  2017 [employer].  Fall Prevention in the Home Falls can cause injuries. They can happen to people of all ages. There are many things you can do to make your home safe and to help prevent falls. What can I do on the outside of my home? Regularly fix the edges of walkways and driveways and fix any cracks. Remove anything that might make you trip as you walk through a door, such as a raised step or threshold. Trim any bushes or trees on the path to your home. Use bright outdoor lighting. Clear any walking paths of anything that might make someone trip, such as rocks or tools. Regularly check to see if handrails are loose or broken. Make sure that both sides of any steps have handrails. Any raised decks and porches should have guardrails on the edges. Have any leaves, snow, or ice cleared regularly. Use sand or salt on walking paths during winter. Clean up any spills in your garage right away. This includes oil or grease spills. What can I do in the bathroom? Use night lights. Install grab bars by the toilet and in the tub and shower. Do not use towel bars as grab bars. Use non-skid mats or decals in the tub or shower. If you need to sit down in the shower, use a plastic, non-slip stool. Keep the floor dry. Clean up any water  that spills on the floor as soon as it happens. Remove soap buildup in the tub or shower regularly. Attach bath mats securely with double-sided non-slip rug tape. Do not have throw rugs and other things on the floor that can make you trip. What can I do in the bedroom? Use night lights. Make sure that you have a light by your bed that is easy to reach. Do not use any sheets or blankets that are too big for your bed. They should not hang down onto the floor. Have a firm chair that has side arms. You can use  this for support while you get dressed. Do not have throw rugs and other things on the floor that can make you trip. What can I do in the kitchen? Clean up any spills right away. Avoid walking on wet floors. Keep items that you use a lot in easy-to-reach places. If you need to reach something above you, use a strong step stool that has a grab bar. Keep electrical cords out of the way. Do not use floor polish or wax that makes floors slippery. If you must use wax, use non-skid floor wax. Do not have throw rugs and other things on the floor that can make you trip. What can I do with my stairs? Do not leave any items on the stairs. Make sure that there are handrails on both sides of the stairs and use them. Fix handrails that are broken or loose. Make sure that handrails are as long as the stairways. Check any carpeting to make sure that it is firmly attached to the stairs. Fix any carpet that is loose or worn. Avoid having throw rugs at the top or bottom of the stairs. If you do have throw rugs, attach them to the floor with carpet tape. Make sure that you have a light switch at the top of the stairs and the bottom  of the stairs. If you do not have them, ask someone to add them for you. What else can I do to help prevent falls? Wear shoes that: Do not have high heels. Have rubber bottoms. Are comfortable and fit you well. Are closed at the toe. Do not wear sandals. If you use a stepladder: Make sure that it is fully opened. Do not climb a closed stepladder. Make sure that both sides of the stepladder are locked into place. Ask someone to hold it for you, if possible. Clearly mark and make sure that you can see: Any grab bars or handrails. First and last steps. Where the edge of each step is. Use tools that help you move around (mobility aids) if they are needed. These include: Canes. Walkers. Scooters. Crutches. Turn on the lights when you go into a dark area. Replace any light bulbs  as soon as they burn out. Set up your furniture so you have a clear path. Avoid moving your furniture around. If any of your floors are uneven, fix them. If there are any pets around you, be aware of where they are. Review your medicines with your doctor. Some medicines can make you feel dizzy. This can increase your chance of falling. Ask your doctor what other things that you can do to help prevent falls. This information is not intended to replace advice given to you by your health care provider. Make sure you discuss any questions you have with your health care provider. Document Released: 02/08/2009 Document Revised: 09/20/2015 Document Reviewed: 05/19/2014 Elsevier Interactive Patient Education  2017 [employer].

## 2023-11-01 ENCOUNTER — Other Ambulatory Visit: Payer: Self-pay | Admitting: Family Medicine

## 2023-11-01 DIAGNOSIS — R7303 Prediabetes: Secondary | ICD-10-CM

## 2023-11-02 ENCOUNTER — Ambulatory Visit: Attending: Cardiovascular Disease | Admitting: Cardiovascular Disease

## 2023-11-02 VITALS — BP 110/50 | HR 63 | Ht 67.0 in | Wt 145.6 lb

## 2023-11-02 DIAGNOSIS — Z950 Presence of cardiac pacemaker: Secondary | ICD-10-CM | POA: Diagnosis not present

## 2023-11-02 DIAGNOSIS — Z951 Presence of aortocoronary bypass graft: Secondary | ICD-10-CM | POA: Insufficient documentation

## 2023-11-02 DIAGNOSIS — I482 Chronic atrial fibrillation, unspecified: Secondary | ICD-10-CM | POA: Diagnosis not present

## 2023-11-02 NOTE — Patient Instructions (Addendum)
 Medication Instructions:  Your physician recommends that you continue on your current medications as directed. Please refer to the Current Medication list given to you today.  *If you need a refill on your cardiac medications before your next appointment, please call your pharmacy*  Lab Work: If you have labs (blood work) drawn today and your tests are completely normal, you will receive your results only by: MyChart Message (if you have MyChart) OR A paper copy in the mail If you have any lab test that is abnormal or we need to change your treatment, we will call you to review the results.  Testing/Procedures: None ordered today.  Follow-Up: At [facility], you and your health needs are our priority.  As part of our continuing mission to provide you with exceptional heart care, our providers are all part of one team.  This team includes your primary Cardiologist (physician) and Advanced Practice Providers or APPs (Physician Assistants and Nurse Practitioners) who all work together to provide you with the care you need, when you need it.  Your next appointment:   12 month(s)  Provider:   Maude Emmer, MD    We recommend signing up for the patient portal called MyChart.  Sign up information is provided on this After Visit Summary.  MyChart is used to connect with patients for Virtual Visits (Telemedicine).  Patients are able to view lab/test results, encounter notes, upcoming appointments, etc.  Non-urgent messages can be sent to your provider as well.   To learn more about what you can do with MyChart, go to [email or web address].

## 2023-11-03 DIAGNOSIS — C44222 Squamous cell carcinoma of skin of right ear and external auricular canal: Secondary | ICD-10-CM | POA: Diagnosis not present

## 2023-11-03 DIAGNOSIS — D485 Neoplasm of uncertain behavior of skin: Secondary | ICD-10-CM | POA: Diagnosis not present

## 2023-11-03 DIAGNOSIS — Z85828 Personal history of other malignant neoplasm of skin: Secondary | ICD-10-CM | POA: Diagnosis not present

## 2023-11-04 DIAGNOSIS — R8271 Bacteriuria: Secondary | ICD-10-CM | POA: Diagnosis not present

## 2023-11-04 DIAGNOSIS — C678 Malignant neoplasm of overlapping sites of bladder: Secondary | ICD-10-CM | POA: Diagnosis not present

## 2023-11-16 DIAGNOSIS — C678 Malignant neoplasm of overlapping sites of bladder: Secondary | ICD-10-CM | POA: Diagnosis not present

## 2023-11-23 DIAGNOSIS — H353134 Nonexudative age-related macular degeneration, bilateral, advanced atrophic with subfoveal involvement: Secondary | ICD-10-CM | POA: Diagnosis not present

## 2023-11-23 NOTE — Progress Notes (Deleted)
  Electrophysiology Office Note:   ID:  [name], DOB 1928/09/13, MRN [id number]  Primary Cardiologist: Maude Emmer, MD Electrophysiologist: Eulas FORBES Furbish, MD  {Click to update primary MD,subspecialty MD or APP then REFRESH:1}    History of Present Illness:   [name] is a 89 y.o. male with h/o CAD s/p CABG and CHB s/p PPM seen today for routine electrophysiology followup.   Since last being seen in our clinic the patient reports doing ***.  he denies chest pain, palpitations, dyspnea, PND, orthopnea, nausea, vomiting, dizziness, syncope, edema, weight gain, or early satiety.   Review of systems complete and found to be negative unless listed in HPI.   EP Information / Studies Reviewed:    EKG is not ordered today. EKG from 03/2023 reviewed which showed V pacing at 63 bpm, likely AF underlying       PPM Interrogation-  reviewed in detail today,  See PACEART report.  Arrhythmia/Device History Abbott dual chamber PPM implanted 2019 for complete heart block    Physical Exam:   VS:  There were no vitals taken for this visit.   Wt Readings from Last 3 Encounters:  11/02/23 145 lb 9.6 oz (66 kg)  10/28/23 138 lb (62.6 kg)  10/22/23 143 lb (64.9 kg)     GEN: No acute distress  NECK: No JVD; No carotid bruits CARDIAC: {EPRHYTHM:28826}, no murmurs, rubs, gallops RESPIRATORY:  Clear to auscultation without rales, wheezing or rhonchi  ABDOMEN: Soft, non-tender, non-distended EXTREMITIES:  {EDEMA LEVEL:28147::No} edema; No deformity   ASSESSMENT AND PLAN:    CHB s/p Abbott PPM  Normal PPM function See Pace Art report No changes today  Permanent AF Continue eliquis  2.5 mg BID for stroke prevention CHA2DS2/VASc is at least 6.  Secondary hypercoagulable state Pt on Eliquis  as above   CAD No s/s of ischemia.      {Click here to Review PMH, Prob List, Meds, Allergies, SHx, FHx  :1}   Disposition:   Follow up with {EPPROVIDERS:28135::EP Team} {EPFOLLOW  UP:28173}  Signed, Ozell Prentice Passey, PA-C

## 2023-11-24 ENCOUNTER — Ambulatory Visit: Admitting: Student

## 2023-11-25 ENCOUNTER — Inpatient Hospital Stay

## 2023-11-25 ENCOUNTER — Inpatient Hospital Stay (HOSPITAL_BASED_OUTPATIENT_CLINIC_OR_DEPARTMENT_OTHER): Admitting: Medical Oncology

## 2023-11-25 ENCOUNTER — Inpatient Hospital Stay: Attending: Hematology & Oncology

## 2023-11-25 ENCOUNTER — Encounter: Payer: Self-pay | Admitting: Medical Oncology

## 2023-11-25 VITALS — BP 130/40 | HR 61 | Temp 97.5°F | Resp 20 | Ht 67.0 in | Wt 146.0 lb

## 2023-11-25 DIAGNOSIS — D500 Iron deficiency anemia secondary to blood loss (chronic): Secondary | ICD-10-CM | POA: Insufficient documentation

## 2023-11-25 DIAGNOSIS — N183 Chronic kidney disease, stage 3 unspecified: Secondary | ICD-10-CM

## 2023-11-25 DIAGNOSIS — C673 Malignant neoplasm of anterior wall of bladder: Secondary | ICD-10-CM | POA: Diagnosis not present

## 2023-11-25 DIAGNOSIS — I482 Chronic atrial fibrillation, unspecified: Secondary | ICD-10-CM | POA: Insufficient documentation

## 2023-11-25 DIAGNOSIS — I480 Paroxysmal atrial fibrillation: Secondary | ICD-10-CM

## 2023-11-25 DIAGNOSIS — D631 Anemia in chronic kidney disease: Secondary | ICD-10-CM | POA: Insufficient documentation

## 2023-11-25 DIAGNOSIS — Z7901 Long term (current) use of anticoagulants: Secondary | ICD-10-CM | POA: Diagnosis not present

## 2023-11-25 DIAGNOSIS — I10 Essential (primary) hypertension: Secondary | ICD-10-CM

## 2023-11-25 DIAGNOSIS — D509 Iron deficiency anemia, unspecified: Secondary | ICD-10-CM

## 2023-11-25 LAB — CMP (CANCER CENTER ONLY)
ALT: 41 U/L (ref 0–44)
AST: 36 U/L (ref 15–41)
Albumin: 3.8 g/dL (ref 3.5–5.0)
Alkaline Phosphatase: 109 U/L (ref 38–126)
Anion gap: 15 (ref 5–15)
BUN: 78 mg/dL — ABNORMAL HIGH (ref 8–23)
CO2: 16 mmol/L — ABNORMAL LOW (ref 22–32)
Calcium: 8.4 mg/dL — ABNORMAL LOW (ref 8.9–10.3)
Chloride: 106 mmol/L (ref 98–111)
Creatinine: 4.44 mg/dL — ABNORMAL HIGH (ref 0.61–1.24)
GFR, Estimated: 12 mL/min — ABNORMAL LOW (ref >60)
Glucose, Bld: 149 mg/dL — ABNORMAL HIGH (ref 70–99)
Potassium: 3.9 mmol/L (ref 3.5–5.1)
Sodium: 137 mmol/L (ref 135–145)
Total Bilirubin: 0.4 mg/dL (ref 0.0–1.2)
Total Protein: 7.9 g/dL (ref 6.5–8.1)

## 2023-11-25 LAB — CBC WITH DIFFERENTIAL (CANCER CENTER ONLY)
Abs Immature Granulocytes: 0.06 K/uL (ref 0.00–0.07)
Basophils Absolute: 0 K/uL (ref 0.0–0.1)
Basophils Relative: 0 %
Eosinophils Absolute: 0.2 K/uL (ref 0.0–0.5)
Eosinophils Relative: 2 %
HCT: 33.7 % — ABNORMAL LOW (ref 39.0–52.0)
Hemoglobin: 11 g/dL — ABNORMAL LOW (ref 13.0–17.0)
Immature Granulocytes: 1 %
Lymphocytes Relative: 24 %
Lymphs Abs: 1.5 K/uL (ref 0.7–4.0)
MCH: 30.6 pg (ref 26.0–34.0)
MCHC: 32.6 g/dL (ref 30.0–36.0)
MCV: 93.6 fL (ref 80.0–100.0)
Monocytes Absolute: 0.9 K/uL (ref 0.1–1.0)
Monocytes Relative: 13 %
Neutro Abs: 3.8 K/uL (ref 1.7–7.7)
Neutrophils Relative %: 60 %
Platelet Count: 112 K/uL — ABNORMAL LOW (ref 150–400)
RBC: 3.6 MIL/uL — ABNORMAL LOW (ref 4.22–5.81)
RDW: 16.1 % — ABNORMAL HIGH (ref 11.5–15.5)
WBC Count: 6.4 K/uL (ref 4.0–10.5)
nRBC: 0 % (ref 0.0–0.2)

## 2023-11-25 LAB — RETICULOCYTES
Immature Retic Fract: 6.3 % (ref 2.3–15.9)
RBC.: 3.6 MIL/uL — ABNORMAL LOW (ref 4.22–5.81)
Retic Count, Absolute: 31 K/uL (ref 19.0–186.0)
Retic Ct Pct: 0.9 % (ref 0.4–3.1)

## 2023-11-25 LAB — IRON AND IRON BINDING CAPACITY (CC-WL,HP ONLY)
Iron: 68 ug/dL (ref 45–182)
Saturation Ratios: 26 % (ref 17.9–39.5)
TIBC: 263 ug/dL (ref 250–450)
UIBC: 195 ug/dL

## 2023-11-25 LAB — FERRITIN: Ferritin: 368 ng/mL — ABNORMAL HIGH (ref 24–336)

## 2023-11-25 NOTE — Progress Notes (Signed)
 Hematology and Oncology Follow Up Visit  [name] [id number] [birth year] 89 y.o. 11/25/2023   Principle Diagnosis:  Erythropoietin  deficiency anemia Iron deficiency anemia Chronic atrial fibrillation  Current Therapy:   IV iron as indicated --Ferrlecit given on 04/21/2023  Aranesp  300 g sq prn hemoglobin < 11 - last dose given on  09/19/2021 Eliquis  2.5 mg p.o. twice daily   Interim History:  [name] is here today for follow-up.   He is here with his daughter. They report that he has been doing well.   He has had no cardiac issues.  He has had no exacerbations of heart failure.  He is on Eliquis .  He has chronic atrial fibrillation. No bleeding.   He has had no issues with nausea or vomiting.  He has finished intravesicular BCG. This was performed by [facility].  Overall, I would have said that his performance status is probably ECOG 2-3.   Wt Readings from Last 3 Encounters:  11/25/23 146 lb 0.3 oz (66.2 kg)  11/02/23 145 lb 9.6 oz (66 kg)  10/28/23 138 lb (62.6 kg)      Medications:  Allergies as of 11/25/2023       Reactions   Lisinopril  Other (See Comments)    he has difficulty controlling potassium on ace/arb- lisinopril  was dc'd in the past 2/2 to hyperkalemia documented by [facility] P[facility] 01/2021   Penicillins Hives   Has patient had a PCN reaction causing immediate rash, facial/tongue/throat swelling, SOB or lightheadedness with hypotension: No Has patient had a PCN reaction causing severe rash involving mucus membranes or skin necrosis: Yes Has patient had a PCN reaction that required hospitalization: No Has patient had a PCN reaction occurring within the last 10 years: No If all of the above answers are NO, then may proceed with Cephalosporin use.   Vancomycin  Other (See Comments)   Edema and myalgia        Medication List        Accurate as of November 25, 2023  4:00 PM. If you have any questions, ask your nurse or doctor.           allopurinol  100 MG tablet Commonly known as: ZYLOPRIM  Take 1 tablet (100 mg total) by mouth daily.   amLODipine  5 MG tablet Commonly known as: NORVASC  Take 1 tablet (5 mg total) by mouth daily.   atorvastatin  20 MG tablet Commonly known as: LIPITOR Take 1 tablet (20 mg total) by mouth at bedtime.   carvedilol  6.25 MG tablet Commonly known as: COREG  Take 1 tablet (6.25 mg total) by mouth 2 (two) times daily with a meal.   Cholecalciferol  125 MCG (5000 UT) capsule Take 5,000 Units by mouth daily.   dicyclomine  10 MG capsule Commonly known as: BENTYL  Take 1 capsule (10 mg total) by mouth every 6 (six) hours as needed.   Eliquis  2.5 MG Tabs tablet Generic drug: apixaban  TAKE 1 TABLET BY MOUTH TWICE A DAY   furosemide  40 MG tablet Commonly known as: LASIX  Take 40 mg by mouth 2 (two) times daily.   latanoprost  0.005 % ophthalmic solution Commonly known as: XALATAN  Place 1 drop into both eyes at bedtime.   onetouch ultrasoft lancets Use to check sugar daily as instructed.   OneTouch Verio test strip Generic drug: glucose blood USE TO CHECK SUGAR DAILY AS INSTRUCTED.   OVER THE COUNTER MEDICATION Take 1 tablet by mouth 2 (two) times daily. MitoQ   polyethylene glycol 17 g packet Commonly known as: MiraLax   Take 17 g by mouth daily.   Potassium Chloride  ER 20 MEQ Tbcr Take 1 tablet (20 mEq total) by mouth 2 (two) times daily.   PreserVision AREDS 2 Caps Take 1 capsule by mouth 2 (two) times daily.   tamsulosin  0.4 MG Caps capsule Commonly known as: FLOMAX  Take 0.8 mg by mouth at bedtime.   timolol  0.5 % ophthalmic solution Commonly known as: TIMOPTIC  Place 1 drop into both eyes 2 (two) times daily.        Allergies:  Allergies  Allergen Reactions   Lisinopril  Other (See Comments)     he has difficulty controlling potassium on ace/arb- lisinopril  was dc'd in the past 2/2 to hyperkalemia documented by [facility] P[facility] 01/2021   Penicillins Hives    Has  patient had a PCN reaction causing immediate rash, facial/tongue/throat swelling, SOB or lightheadedness with hypotension: No Has patient had a PCN reaction causing severe rash involving mucus membranes or skin necrosis: Yes Has patient had a PCN reaction that required hospitalization: No Has patient had a PCN reaction occurring within the last 10 years: No If all of the above answers are NO, then may proceed with Cephalosporin use.    Vancomycin  Other (See Comments)    Edema and myalgia    Past Medical History, Surgical history, Social history, and Family History were reviewed and updated.  Review of Systems: Review of Systems  Constitutional: Negative.   HENT: Negative.    Eyes: Negative.   Respiratory: Negative.    Cardiovascular:  Positive for palpitations.  Gastrointestinal: Negative.   Genitourinary: Negative.   Musculoskeletal: Negative.   Skin: Negative.   Neurological: Negative.   Endo/Heme/Allergies: Negative.   Psychiatric/Behavioral: Negative.       Physical Exam: Vitals:   11/25/23 1521  BP: (!) 130/40  Pulse: 61  Resp: 20  Temp: (!) 97.5 F (36.4 C)  SpO2: 100%     Wt Readings from Last 3 Encounters:  11/25/23 146 lb 0.3 oz (66.2 kg)  11/02/23 145 lb 9.6 oz (66 kg)  10/28/23 138 lb (62.6 kg)    Physical Exam Vitals reviewed.  HENT:     Head: Normocephalic and atraumatic.  Eyes:     Pupils: Pupils are equal, round, and reactive to light.  Cardiovascular:     Rate and Rhythm: Normal rate and regular rhythm.     Heart sounds: Normal heart sounds.  Pulmonary:     Effort: Pulmonary effort is normal.     Breath sounds: Normal breath sounds.  Abdominal:     General: Bowel sounds are normal.     Palpations: Abdomen is soft.  Musculoskeletal:        General: No tenderness or deformity. Normal range of motion.     Cervical back: Normal range of motion.  Lymphadenopathy:     Cervical: No cervical adenopathy.  Skin:    General: Skin is warm and  dry.     Findings: No erythema or rash.  Neurological:     Mental Status: He is alert and oriented to person, place, and time.  Psychiatric:        Behavior: Behavior normal.        Thought Content: Thought content normal.        Judgment: Judgment normal.      Lab Results  Component Value Date   WBC 6.4 11/25/2023   HGB 11.0 (L) 11/25/2023   HCT 33.7 (L) 11/25/2023   MCV 93.6 11/25/2023   PLT 112 (L) 11/25/2023  Lab Results  Component Value Date   FERRITIN 477 (H) 10/22/2023   IRON 64 10/22/2023   TIBC 280 10/22/2023   UIBC 216 10/22/2023   IRONPCTSAT 23 10/22/2023   Lab Results  Component Value Date   RETICCTPCT 0.9 11/25/2023   RBC 3.60 (L) 11/25/2023   RBC 3.60 (L) 11/25/2023   No results found for: KPAFRELGTCHN, LAMBDASER, KAPLAMBRATIO No results found for: KIMBERLY LE, IGMSERUM Lab Results  Component Value Date   TOTALPROTELP 6.0 (L) 12/26/2015   ALBUMINELP 3.4 (L) 12/26/2015   A1GS 0.4 (H) 12/26/2015   A2GS 0.8 12/26/2015   BETS 0.4 12/26/2015   BETA2SER 0.3 12/26/2015   GAMS 0.8 12/26/2015   MSPIKE Not Observed 01/29/2016   SPEI SEE NOTE 12/26/2015     Chemistry      Component Value Date/Time   NA 137 11/25/2023 1509   NA 139 05/29/2023 0000   NA 145 04/09/2017 0905   NA 140 03/28/2016 0853   K 3.9 11/25/2023 1509   K 4.5 04/09/2017 0905   K 4.4 03/28/2016 0853   CL 106 11/25/2023 1509   CL 105 04/09/2017 0905   CO2 16 (L) 11/25/2023 1509   CO2 27 04/09/2017 0905   CO2 19 (L) 03/28/2016 0853   BUN 78 (H) 11/25/2023 1509   BUN 62 (A) 05/29/2023 0000   BUN 33 (H) 04/09/2017 0905   BUN 34.4 (H) 03/28/2016 0853   CREATININE 4.44 (H) 11/25/2023 1509   CREATININE 2.35 (H) 01/23/2023 1430   CREATININE 1.7 (H) 03/28/2016 0853   GLU 128 05/29/2023 0000      Component Value Date/Time   CALCIUM  8.4 (L) 11/25/2023 1509   CALCIUM  9.2 04/09/2017 0905   CALCIUM  9.2 03/28/2016 0853   ALKPHOS 109 11/25/2023 1509   ALKPHOS 69  04/09/2017 0905   ALKPHOS 85 03/28/2016 0853   AST 36 11/25/2023 1509   AST 23 03/28/2016 0853   ALT 41 11/25/2023 1509   ALT 27 04/09/2017 0905   ALT 37 03/28/2016 0853   BILITOT 0.4 11/25/2023 1509   BILITOT 0.79 03/28/2016 0853     Encounter Diagnoses  Name Primary?   Iron deficiency anemia, unspecified iron deficiency anemia type Yes   Erythropoietin  deficiency anemia    Cancer of anterior wall of urinary bladder (HCC)    Iron deficiency anemia due to chronic blood loss    Stage 3 chronic kidney disease, unspecified whether stage 3a or 3b CKD (HCC)     Impression and Plan: Mr. Lebeau is a very pleasant 89 yo caucasian gentleman with both erythropoietin  deficiency and iron deficiency anemia.   No Aranesp  needed today for his Hgb of 11.  Platelets are stable with a value of 112.  Creatinine is elevated- they will reach out his nephrologist- has follow up in August.  Iron levels pending.   RTC 1 month MD, labs (CBC, CMP, ferritin, retic, iron)  Lauraine CHRISTELLA Dais, PA-C 7/30/20254:00 PM

## 2023-11-30 DIAGNOSIS — Z85828 Personal history of other malignant neoplasm of skin: Secondary | ICD-10-CM | POA: Diagnosis not present

## 2023-11-30 DIAGNOSIS — C44222 Squamous cell carcinoma of skin of right ear and external auricular canal: Secondary | ICD-10-CM | POA: Diagnosis not present

## 2023-12-08 DIAGNOSIS — I129 Hypertensive chronic kidney disease with stage 1 through stage 4 chronic kidney disease, or unspecified chronic kidney disease: Secondary | ICD-10-CM | POA: Diagnosis not present

## 2023-12-08 DIAGNOSIS — D631 Anemia in chronic kidney disease: Secondary | ICD-10-CM | POA: Diagnosis not present

## 2023-12-08 DIAGNOSIS — I5020 Unspecified systolic (congestive) heart failure: Secondary | ICD-10-CM | POA: Diagnosis not present

## 2023-12-08 DIAGNOSIS — C679 Malignant neoplasm of bladder, unspecified: Secondary | ICD-10-CM | POA: Diagnosis not present

## 2023-12-08 DIAGNOSIS — N179 Acute kidney failure, unspecified: Secondary | ICD-10-CM | POA: Diagnosis not present

## 2023-12-08 DIAGNOSIS — N184 Chronic kidney disease, stage 4 (severe): Secondary | ICD-10-CM | POA: Diagnosis not present

## 2023-12-09 ENCOUNTER — Ambulatory Visit: Payer: Medicare Other | Admitting: Family Medicine

## 2023-12-09 ENCOUNTER — Other Ambulatory Visit: Payer: Self-pay | Admitting: Nephrology

## 2023-12-09 DIAGNOSIS — N179 Acute kidney failure, unspecified: Secondary | ICD-10-CM

## 2023-12-11 ENCOUNTER — Inpatient Hospital Stay (HOSPITAL_COMMUNITY)
Admission: EM | Admit: 2023-12-11 | Discharge: 2023-12-14 | DRG: 682 | Disposition: A | Discharge status: Hospice | Attending: Internal Medicine | Admitting: Internal Medicine

## 2023-12-11 ENCOUNTER — Emergency Department (HOSPITAL_COMMUNITY)

## 2023-12-11 ENCOUNTER — Encounter (HOSPITAL_COMMUNITY): Payer: Self-pay | Admitting: Emergency Medicine

## 2023-12-11 ENCOUNTER — Telehealth: Payer: Self-pay

## 2023-12-11 ENCOUNTER — Other Ambulatory Visit: Payer: Self-pay

## 2023-12-11 ENCOUNTER — Inpatient Hospital Stay (HOSPITAL_COMMUNITY)

## 2023-12-11 DIAGNOSIS — I5033 Acute on chronic diastolic (congestive) heart failure: Secondary | ICD-10-CM | POA: Diagnosis present

## 2023-12-11 DIAGNOSIS — G3184 Mild cognitive impairment, so stated: Secondary | ICD-10-CM | POA: Diagnosis present

## 2023-12-11 DIAGNOSIS — Z950 Presence of cardiac pacemaker: Secondary | ICD-10-CM

## 2023-12-11 DIAGNOSIS — I130 Hypertensive heart and chronic kidney disease with heart failure and stage 1 through stage 4 chronic kidney disease, or unspecified chronic kidney disease: Secondary | ICD-10-CM | POA: Diagnosis present

## 2023-12-11 DIAGNOSIS — N390 Urinary tract infection, site not specified: Secondary | ICD-10-CM | POA: Diagnosis not present

## 2023-12-11 DIAGNOSIS — I482 Chronic atrial fibrillation, unspecified: Secondary | ICD-10-CM | POA: Diagnosis present

## 2023-12-11 DIAGNOSIS — R402431 Glasgow coma scale score 3-8, in the field [EMT or ambulance]: Secondary | ICD-10-CM | POA: Diagnosis not present

## 2023-12-11 DIAGNOSIS — I5042 Chronic combined systolic (congestive) and diastolic (congestive) heart failure: Secondary | ICD-10-CM | POA: Diagnosis not present

## 2023-12-11 DIAGNOSIS — I679 Cerebrovascular disease, unspecified: Secondary | ICD-10-CM | POA: Diagnosis not present

## 2023-12-11 DIAGNOSIS — G9341 Metabolic encephalopathy: Secondary | ICD-10-CM | POA: Diagnosis not present

## 2023-12-11 DIAGNOSIS — I4891 Unspecified atrial fibrillation: Secondary | ICD-10-CM | POA: Diagnosis present

## 2023-12-11 DIAGNOSIS — R4182 Altered mental status, unspecified: Secondary | ICD-10-CM | POA: Diagnosis not present

## 2023-12-11 DIAGNOSIS — I10 Essential (primary) hypertension: Secondary | ICD-10-CM | POA: Diagnosis present

## 2023-12-11 DIAGNOSIS — N3001 Acute cystitis with hematuria: Secondary | ICD-10-CM | POA: Diagnosis present

## 2023-12-11 DIAGNOSIS — R410 Disorientation, unspecified: Secondary | ICD-10-CM | POA: Diagnosis not present

## 2023-12-11 DIAGNOSIS — I441 Atrioventricular block, second degree: Secondary | ICD-10-CM | POA: Diagnosis present

## 2023-12-11 DIAGNOSIS — E1122 Type 2 diabetes mellitus with diabetic chronic kidney disease: Secondary | ICD-10-CM | POA: Diagnosis present

## 2023-12-11 DIAGNOSIS — Z7901 Long term (current) use of anticoagulants: Secondary | ICD-10-CM

## 2023-12-11 DIAGNOSIS — E039 Hypothyroidism, unspecified: Secondary | ICD-10-CM | POA: Diagnosis present

## 2023-12-11 DIAGNOSIS — D509 Iron deficiency anemia, unspecified: Secondary | ICD-10-CM | POA: Diagnosis present

## 2023-12-11 DIAGNOSIS — N189 Chronic kidney disease, unspecified: Secondary | ICD-10-CM | POA: Diagnosis not present

## 2023-12-11 DIAGNOSIS — J9811 Atelectasis: Secondary | ICD-10-CM | POA: Diagnosis not present

## 2023-12-11 DIAGNOSIS — R531 Weakness: Secondary | ICD-10-CM | POA: Diagnosis not present

## 2023-12-11 DIAGNOSIS — N186 End stage renal disease: Secondary | ICD-10-CM | POA: Diagnosis not present

## 2023-12-11 DIAGNOSIS — N1831 Chronic kidney disease, stage 3a: Secondary | ICD-10-CM | POA: Insufficient documentation

## 2023-12-11 DIAGNOSIS — R451 Restlessness and agitation: Secondary | ICD-10-CM | POA: Diagnosis not present

## 2023-12-11 DIAGNOSIS — N185 Chronic kidney disease, stage 5: Secondary | ICD-10-CM | POA: Diagnosis not present

## 2023-12-11 DIAGNOSIS — N179 Acute kidney failure, unspecified: Principal | ICD-10-CM | POA: Diagnosis present

## 2023-12-11 DIAGNOSIS — I25708 Atherosclerosis of coronary artery bypass graft(s), unspecified, with other forms of angina pectoris: Secondary | ICD-10-CM | POA: Diagnosis present

## 2023-12-11 DIAGNOSIS — E785 Hyperlipidemia, unspecified: Secondary | ICD-10-CM | POA: Diagnosis present

## 2023-12-11 DIAGNOSIS — M109 Gout, unspecified: Secondary | ICD-10-CM | POA: Diagnosis present

## 2023-12-11 DIAGNOSIS — Z515 Encounter for palliative care: Secondary | ICD-10-CM

## 2023-12-11 DIAGNOSIS — E872 Acidosis, unspecified: Secondary | ICD-10-CM | POA: Diagnosis present

## 2023-12-11 DIAGNOSIS — B952 Enterococcus as the cause of diseases classified elsewhere: Secondary | ICD-10-CM | POA: Diagnosis present

## 2023-12-11 DIAGNOSIS — Z79890 Hormone replacement therapy (postmenopausal): Secondary | ICD-10-CM

## 2023-12-11 DIAGNOSIS — E875 Hyperkalemia: Secondary | ICD-10-CM | POA: Diagnosis not present

## 2023-12-11 DIAGNOSIS — C679 Malignant neoplasm of bladder, unspecified: Secondary | ICD-10-CM | POA: Diagnosis not present

## 2023-12-11 DIAGNOSIS — Z87891 Personal history of nicotine dependence: Secondary | ICD-10-CM

## 2023-12-11 DIAGNOSIS — D649 Anemia, unspecified: Secondary | ICD-10-CM | POA: Diagnosis not present

## 2023-12-11 DIAGNOSIS — I2510 Atherosclerotic heart disease of native coronary artery without angina pectoris: Secondary | ICD-10-CM | POA: Diagnosis present

## 2023-12-11 DIAGNOSIS — Z8249 Family history of ischemic heart disease and other diseases of the circulatory system: Secondary | ICD-10-CM

## 2023-12-11 DIAGNOSIS — I480 Paroxysmal atrial fibrillation: Secondary | ICD-10-CM | POA: Diagnosis present

## 2023-12-11 DIAGNOSIS — D631 Anemia in chronic kidney disease: Secondary | ICD-10-CM | POA: Diagnosis not present

## 2023-12-11 DIAGNOSIS — R5383 Other fatigue: Secondary | ICD-10-CM | POA: Diagnosis not present

## 2023-12-11 DIAGNOSIS — Z85828 Personal history of other malignant neoplasm of skin: Secondary | ICD-10-CM

## 2023-12-11 DIAGNOSIS — C673 Malignant neoplasm of anterior wall of bladder: Secondary | ICD-10-CM | POA: Diagnosis present

## 2023-12-11 DIAGNOSIS — Z79899 Other long term (current) drug therapy: Secondary | ICD-10-CM

## 2023-12-11 DIAGNOSIS — E1151 Type 2 diabetes mellitus with diabetic peripheral angiopathy without gangrene: Secondary | ICD-10-CM | POA: Diagnosis not present

## 2023-12-11 DIAGNOSIS — I6381 Other cerebral infarction due to occlusion or stenosis of small artery: Secondary | ICD-10-CM | POA: Diagnosis not present

## 2023-12-11 DIAGNOSIS — I672 Cerebral atherosclerosis: Secondary | ICD-10-CM | POA: Diagnosis not present

## 2023-12-11 DIAGNOSIS — Z95811 Presence of heart assist device: Secondary | ICD-10-CM | POA: Diagnosis not present

## 2023-12-11 DIAGNOSIS — Z743 Need for continuous supervision: Secondary | ICD-10-CM | POA: Diagnosis not present

## 2023-12-11 DIAGNOSIS — N184 Chronic kidney disease, stage 4 (severe): Secondary | ICD-10-CM | POA: Diagnosis not present

## 2023-12-11 DIAGNOSIS — R0602 Shortness of breath: Secondary | ICD-10-CM | POA: Diagnosis not present

## 2023-12-11 DIAGNOSIS — Z7189 Other specified counseling: Secondary | ICD-10-CM | POA: Diagnosis not present

## 2023-12-11 DIAGNOSIS — Z66 Do not resuscitate: Secondary | ICD-10-CM | POA: Diagnosis present

## 2023-12-11 DIAGNOSIS — Z8673 Personal history of transient ischemic attack (TIA), and cerebral infarction without residual deficits: Secondary | ICD-10-CM | POA: Diagnosis not present

## 2023-12-11 DIAGNOSIS — R339 Retention of urine, unspecified: Secondary | ICD-10-CM | POA: Diagnosis not present

## 2023-12-11 DIAGNOSIS — J90 Pleural effusion, not elsewhere classified: Secondary | ICD-10-CM | POA: Diagnosis not present

## 2023-12-11 DIAGNOSIS — I129 Hypertensive chronic kidney disease with stage 1 through stage 4 chronic kidney disease, or unspecified chronic kidney disease: Secondary | ICD-10-CM | POA: Diagnosis not present

## 2023-12-11 DIAGNOSIS — Z880 Allergy status to penicillin: Secondary | ICD-10-CM

## 2023-12-11 DIAGNOSIS — J811 Chronic pulmonary edema: Secondary | ICD-10-CM | POA: Diagnosis not present

## 2023-12-11 DIAGNOSIS — Z789 Other specified health status: Secondary | ICD-10-CM | POA: Diagnosis not present

## 2023-12-11 LAB — URINALYSIS, ROUTINE W REFLEX MICROSCOPIC
Bilirubin Urine: NEGATIVE
Glucose, UA: NEGATIVE mg/dL
Ketones, ur: NEGATIVE mg/dL
Nitrite: NEGATIVE
Protein, ur: 100 mg/dL — AB
RBC / HPF: >50 RBC/hpf (ref 0–5)
Specific Gravity, Urine: 1.012 (ref 1.005–1.030)
WBC, UA: >50 WBC/hpf (ref 0–5)
pH: 5 (ref 5.0–8.0)

## 2023-12-11 LAB — CBG MONITORING, ED
Glucose-Capillary: 85 mg/dL (ref 70–99)
Glucose-Capillary: 165 mg/dL — ABNORMAL HIGH (ref 70–99)
Glucose-Capillary: 88 mg/dL (ref 70–99)

## 2023-12-11 LAB — SALICYLATE LEVEL: Salicylate Lvl: <7 mg/dL — ABNORMAL LOW (ref 7.0–30.0)

## 2023-12-11 LAB — COMPREHENSIVE METABOLIC PANEL WITH GFR
ALT: 22 U/L (ref 0–44)
AST: 26 U/L (ref 15–41)
Albumin: 3 g/dL — ABNORMAL LOW (ref 3.5–5.0)
Alkaline Phosphatase: 76 U/L (ref 38–126)
Anion gap: 8 (ref 5–15)
BUN: 114 mg/dL — ABNORMAL HIGH (ref 8–23)
CO2: 11 mmol/L — ABNORMAL LOW (ref 22–32)
Calcium: 8.6 mg/dL — ABNORMAL LOW (ref 8.9–10.3)
Chloride: 116 mmol/L — ABNORMAL HIGH (ref 98–111)
Creatinine, Ser: 4.45 mg/dL — ABNORMAL HIGH (ref 0.61–1.24)
GFR, Estimated: 12 mL/min — ABNORMAL LOW (ref >60)
Glucose, Bld: 100 mg/dL — ABNORMAL HIGH (ref 70–99)
Potassium: 6.5 mmol/L (ref 3.5–5.1)
Sodium: 135 mmol/L (ref 135–145)
Total Bilirubin: 0.7 mg/dL (ref 0.0–1.2)
Total Protein: 7.2 g/dL (ref 6.5–8.1)

## 2023-12-11 LAB — AMMONIA: Ammonia: <13 umol/L (ref 9–35)

## 2023-12-11 LAB — CBC WITH DIFFERENTIAL/PLATELET
Abs Granulocyte: 4.2 K/uL (ref 1.5–6.5)
Abs Immature Granulocytes: 0.04 K/uL (ref 0.00–0.07)
Basophils Absolute: 0 K/uL (ref 0.0–0.1)
Basophils Relative: 0 %
Eosinophils Absolute: 0.1 K/uL (ref 0.0–0.5)
Eosinophils Relative: 2 %
HCT: 28.6 % — ABNORMAL LOW (ref 39.0–52.0)
Hemoglobin: 9.3 g/dL — ABNORMAL LOW (ref 13.0–17.0)
Immature Granulocytes: 1 %
Lymphocytes Relative: 22 %
Lymphs Abs: 1.4 K/uL (ref 0.7–4.0)
MCH: 31 pg (ref 26.0–34.0)
MCHC: 32.5 g/dL (ref 30.0–36.0)
MCV: 95.3 fL (ref 80.0–100.0)
Monocytes Absolute: 0.4 K/uL (ref 0.1–1.0)
Monocytes Relative: 6 %
Neutro Abs: 4.2 K/uL (ref 1.7–7.7)
Neutrophils Relative %: 69 %
Platelets: 95 K/uL — ABNORMAL LOW (ref 150–400)
RBC: 3 MIL/uL — ABNORMAL LOW (ref 4.22–5.81)
RDW: 17 % — ABNORMAL HIGH (ref 11.5–15.5)
Smear Review: NORMAL
WBC: 6.1 K/uL (ref 4.0–10.5)
nRBC: 0 % (ref 0.0–0.2)

## 2023-12-11 LAB — BRAIN NATRIURETIC PEPTIDE: B Natriuretic Peptide: 2084.3 pg/mL — ABNORMAL HIGH (ref 0.0–100.0)

## 2023-12-11 LAB — POTASSIUM: Potassium: 6.4 mmol/L (ref 3.5–5.1)

## 2023-12-11 LAB — RAPID URINE DRUG SCREEN, HOSP PERFORMED
Amphetamines: NOT DETECTED
Barbiturates: NOT DETECTED
Benzodiazepines: NOT DETECTED
Cocaine: NOT DETECTED
Opiates: NOT DETECTED
Tetrahydrocannabinol: NOT DETECTED

## 2023-12-11 LAB — I-STAT CG4 LACTIC ACID, ED
Lactic Acid, Venous: 0.4 mmol/L — ABNORMAL LOW (ref 0.5–1.9)
Lactic Acid, Venous: 0.4 mmol/L — ABNORMAL LOW (ref 0.5–1.9)

## 2023-12-11 LAB — CK: Total CK: 80 U/L (ref 49–397)

## 2023-12-11 LAB — MAGNESIUM: Magnesium: 2.1 mg/dL (ref 1.7–2.4)

## 2023-12-11 LAB — T4, FREE: Free T4: <0.25 ng/dL — ABNORMAL LOW (ref 0.61–1.12)

## 2023-12-11 LAB — TROPONIN I (HIGH SENSITIVITY)
Troponin I (High Sensitivity): 76 ng/L — ABNORMAL HIGH (ref <18)
Troponin I (High Sensitivity): 76 ng/L — ABNORMAL HIGH (ref <18)

## 2023-12-11 LAB — ACETAMINOPHEN LEVEL: Acetaminophen (Tylenol), Serum: <10 ug/mL — ABNORMAL LOW (ref 10–30)

## 2023-12-11 LAB — TSH: TSH: 110.765 u[IU]/mL — ABNORMAL HIGH (ref 0.350–4.500)

## 2023-12-11 MED ORDER — ATORVASTATIN CALCIUM 10 MG PO TABS
20.0000 mg | ORAL_TABLET | Freq: Every day | ORAL | Status: DC
  Administered 2023-12-13: 20 mg via ORAL
  Filled 2023-12-11 (×4): qty 2

## 2023-12-11 MED ORDER — DEXTROSE 50 % IV SOLN
1.0000 | Freq: Once | INTRAVENOUS | Status: AC
  Administered 2023-12-11: 50 mL via INTRAVENOUS
  Filled 2023-12-11: qty 50

## 2023-12-11 MED ORDER — CHLORHEXIDINE GLUCONATE CLOTH 2 % EX PADS
6.0000 | MEDICATED_PAD | Freq: Every day | CUTANEOUS | Status: DC
  Administered 2023-12-11 – 2023-12-13 (×3): 6 via TOPICAL

## 2023-12-11 MED ORDER — POLYETHYLENE GLYCOL 3350 17 G PO PACK: 17.0000 g | PACK | Freq: Every day | ORAL | Status: DC | PRN

## 2023-12-11 MED ORDER — CALCIUM GLUCONATE 10 % IV SOLN: 1.0000 g | Freq: Once | INTRAVENOUS | Status: AC

## 2023-12-11 MED ORDER — SODIUM CHLORIDE 0.9 % IV SOLN
1.0000 g | Freq: Once | INTRAVENOUS | Status: AC
  Administered 2023-12-11: 1 g via INTRAVENOUS
  Filled 2023-12-11: qty 10

## 2023-12-11 MED ORDER — SODIUM ZIRCONIUM CYCLOSILICATE 10 G PO PACK
10.0000 g | PACK | Freq: Three times a day (TID) | ORAL | Status: DC
  Administered 2023-12-12 (×2): 10 g via ORAL
  Filled 2023-12-11 (×4): qty 1

## 2023-12-11 MED ORDER — SODIUM BICARBONATE 8.4 % IV SOLN
INTRAVENOUS | Status: AC
  Filled 2023-12-11 (×3): qty 1000

## 2023-12-11 MED ORDER — ACETAMINOPHEN 650 MG RE SUPP: 650.0000 mg | Freq: Four times a day (QID) | RECTAL | Status: DC | PRN

## 2023-12-11 MED ORDER — TAMSULOSIN HCL 0.4 MG PO CAPS
0.8000 mg | ORAL_CAPSULE | Freq: Every day | ORAL | Status: DC
  Administered 2023-12-11: 0.8 mg via ORAL
  Filled 2023-12-11 (×4): qty 2

## 2023-12-11 MED ORDER — INSULIN ASPART 100 UNIT/ML IV SOLN
10.0000 [IU] | Freq: Once | INTRAVENOUS | Status: AC
  Administered 2023-12-11: 10 [IU] via INTRAVENOUS

## 2023-12-11 MED ORDER — ACETAMINOPHEN 325 MG PO TABS
650.0000 mg | ORAL_TABLET | Freq: Four times a day (QID) | ORAL | Status: DC | PRN
  Administered 2023-12-11: 650 mg via ORAL
  Filled 2023-12-11: qty 2

## 2023-12-11 MED ORDER — HALOPERIDOL LACTATE 5 MG/ML IJ SOLN
3.0000 mg | Freq: Once | INTRAMUSCULAR | Status: AC
  Administered 2023-12-11: 3 mg via INTRAVENOUS
  Filled 2023-12-11: qty 1

## 2023-12-11 MED ORDER — CALCIUM GLUCONATE-NACL 1-0.675 GM/50ML-% IV SOLN: 1.0000 g | Freq: Once | INTRAVENOUS | Status: DC

## 2023-12-11 MED ORDER — SODIUM CHLORIDE 0.9% FLUSH
3.0000 mL | Freq: Two times a day (BID) | INTRAVENOUS | Status: DC
  Administered 2023-12-11 – 2023-12-13 (×5): 3 mL via INTRAVENOUS

## 2023-12-11 MED ORDER — SODIUM CHLORIDE 0.9 % IV SOLN: 2.0000 g | Freq: Once | INTRAVENOUS | Status: DC

## 2023-12-11 MED ORDER — CALCIUM GLUCONATE 10 % IV SOLN
INTRAVENOUS | Status: AC
  Administered 2023-12-11: 1 g via INTRAVENOUS
  Filled 2023-12-11: qty 10

## 2023-12-11 MED ORDER — LACTATED RINGERS IV BOLUS
250.0000 mL | Freq: Once | INTRAVENOUS | Status: AC
  Administered 2023-12-11: 250 mL via INTRAVENOUS

## 2023-12-11 MED ORDER — SODIUM ZIRCONIUM CYCLOSILICATE 10 G PO PACK
10.0000 g | PACK | Freq: Three times a day (TID) | ORAL | Status: DC
  Administered 2023-12-11: 10 g via ORAL
  Filled 2023-12-11 (×2): qty 1

## 2023-12-11 MED ORDER — APIXABAN 2.5 MG PO TABS
2.5000 mg | ORAL_TABLET | Freq: Two times a day (BID) | ORAL | Status: DC
  Administered 2023-12-12 – 2023-12-13 (×2): 2.5 mg via ORAL
  Filled 2023-12-11 (×4): qty 1

## 2023-12-11 MED ORDER — SODIUM BICARBONATE 650 MG PO TABS: 650.0000 mg | ORAL_TABLET | Freq: Three times a day (TID) | ORAL | Status: DC

## 2023-12-11 MED ORDER — LEVOTHYROXINE SODIUM 50 MCG PO TABS
50.0000 ug | ORAL_TABLET | Freq: Every day | ORAL | Status: DC
  Administered 2023-12-12: 50 ug via ORAL
  Filled 2023-12-11 (×2): qty 1

## 2023-12-11 NOTE — ED Notes (Signed)
 Bedside report given to [facility]

## 2023-12-11 NOTE — ED Notes (Signed)
 Pt calm and cooperative, laying in the bed talking with family at bedside. Denies any concerns at this time. Denies any pain at this time.

## 2023-12-11 NOTE — Telephone Encounter (Signed)
**Note De-identified  [place] Obfuscation** Please advise 

## 2023-12-11 NOTE — ED Notes (Signed)
 Pt transported to [facility] 

## 2023-12-11 NOTE — Telephone Encounter (Signed)
 Called and spoke with pt's daughter. Pt daughter stated pt was currently being admitted into the hospital. She stated that palliative care was being discussed there.

## 2023-12-11 NOTE — ED Triage Notes (Signed)
 Pt to ER via EMS family reports over last week not eating or drinking as much.  This AM was harder to wake than normal.  Pt arrives drowsy, but wakes easily to answer questions appropriately.  No stated c/o by pt.

## 2023-12-11 NOTE — ED Notes (Signed)
 Multiple attempts to obtain oral temp unsuccessful. Was able to obtain rectal temp of 94.3, notified MD Seena. Received orders to perform active warming on patient. Applied multiple warm blankets to patient. Patient has ready bed, will notify receiving RN.

## 2023-12-11 NOTE — H&P (Signed)
 History and Physical   [name] DOB: Oct 23, 1928 DOA: 12/11/2023  PCP: Catherine Charlies LABOR, DO   Patient coming from: Home  Chief Complaint: Weakness, confusion, shortness of breath.  HPI: [name] is a 89 y.o. male with medical history significant of hypertension, hyperlipidemia, atrial fibrillation, CKD 3-4, CAD status post CABG, secondary AV block status post pacemaker, anemia, gout, CHF, bladder cancer presenting with weakness, confusion, shortness of breath.  Patient presenting with some worsening confusion and weakness and some mild shortness of breath for the past couple of days.  Weakness is general and patient has had decreased p.o. intake but no obvious nausea.  Patient has been following with nephrology did recently stop his Lasix  with concern that this was worsening his renal function.  Creatinine has trended up since for the past 5 months.  4 to 5 months ago creatinine is around 2.4, was about 3.4 a month ago and now about 4.4.  Patient unable to fully participate in review of systems due to his confusion and some baseline mild cognitive impairment.  ED Course: Vital signs in the ED notable for temperature of 96.7.  Review of Systems: Patient unable to fully participate in review of systems due to his confusion and some baseline mild cognitive impairment.  Past Medical History:  Diagnosis Date   A-fib [facility])    Acute on chronic heart failure with preserved ejection fraction (HFpEF) (HCC) 04/18/2023   Basal cell carcinoma    skin   Bigeminal rhythm    Bradycardia 06/21/2017   Cancer of anterior wall of urinary bladder (HCC) 07/18/2019   Cataract    Chronic renal insufficiency, stage 3 (moderate) (HCC) 2018   GFR 30s-40s   Coronary artery disease    post bypass   CVD (cardiovascular disease)    Diabetes mellitus without complication (HCC)    type 2 diet controlled   Diverticular disease    Easy bruising    Erythropoietin  deficiency anemia  02/04/2016   Family history of adverse reaction to anesthesia    daugther PONV   Gout    Hematuria 06/30/2019   Hernia    Hyperkalemia 05/06/2017   Hyperlipidemia    Hypertension    IBS (irritable bowel syndrome)    Iron deficiency anemia 02/04/2016   Macular degeneration    Microscopic colitis    PVD (peripheral vascular disease) (HCC)     Past Surgical History:  Procedure Laterality Date   CARDIAC CATHETERIZATION  2006   CARDIOVERSION N/A 02/22/2013   Procedure: CARDIOVERSION;  Surgeon: Maude JAYSON Emmer, MD;  Location: [facility] ENDOSCOPY;  Service: Cardiovascular;  Laterality: N/A;   CARDIOVERSION N/A 11/15/2014   Procedure: CARDIOVERSION;  Surgeon: Maude JAYSON Emmer, MD;  Location: [facility] ENDOSCOPY;  Service: Cardiovascular;  Laterality: N/A;   CARDIOVERSION N/A 04/01/2017   Procedure: CARDIOVERSION;  Surgeon: Rolan Ezra RAMAN, MD;  Location: [facility] ENDOSCOPY;  Service: Cardiovascular;  Laterality: N/A;   CAROTID ENDARTERECTOMY  2009/ 1993   left/ right   COLONOSCOPY  03/17/2005   The colon is normal.   CORONARY ARTERY BYPASS GRAFT  1999   CYSTOSCOPY W/ RETROGRADES Bilateral 07/14/2019   Procedure: CYSTOSCOPY WITH RETROGRADE PYELOGRAM;  Surgeon: Matilda Senior, MD;  Location: [facility];  Service: Urology;  Laterality: Bilateral;   ESOPHAGOGASTRODUODENOSCOPY  03/17/2005   Normal esophagus. Normal Stomanch. Normal duodenum.    EYE SURGERY     eyelid repair   INGUINAL HERNIA REPAIR  02/11/2012   Procedure: LAPAROSCOPIC BILATERAL INGUINAL HERNIA REPAIR;  Surgeon: Donnice KATHEE Lunger, MD;  Location: [facility] ORS;  Service: General;  Laterality: Bilateral;   PACEMAKER IMPLANT N/A 07/28/2017   [employer] Jude Medical Assurity MRI conditional  dual-chamber pacemaker for symptomatic second degree AV block by Dr Kelsie   SKIN CANCER EXCISION     right ear x 3   TRANSURETHRAL RESECTION OF BLADDER TUMOR N/A 09/05/2019   Procedure: TRANSURETHRAL RESECTION OF BLADDER TUMOR (TURBT);  Surgeon: Matilda Senior, MD;  Location: [facility];  Service: Urology;  Laterality: N/A;   TRANSURETHRAL RESECTION OF BLADDER TUMOR WITH MITOMYCIN -C N/A 07/14/2019   Procedure: TRANSURETHRAL RESECTION OF BLADDER TUMOR WITH GEMCITABINE  IN PACU;  Surgeon: Matilda Senior, MD;  Location: [facility];  Service: Urology;  Laterality: N/A;    Social History  reports that he has quit smoking. He quit smokeless tobacco use about 54 years ago.  His smokeless tobacco use included chew. He reports that he does not drink alcohol and does not use drugs.  Allergies  Allergen Reactions   Lisinopril  Other (See Comments)     he has difficulty controlling potassium on ace/arb- lisinopril  was dc'd in the past 2/2 to hyperkalemia documented by [facility] PCP 01/2021   Penicillins Hives    Has patient had a PCN reaction causing immediate rash, facial/tongue/throat swelling, SOB or lightheadedness with hypotension: No Has patient had a PCN reaction causing severe rash involving mucus membranes or skin necrosis: Yes Has patient had a PCN reaction that required hospitalization: No Has patient had a PCN reaction occurring within the last 10 years: No If all of the above answers are NO, then may proceed with Cephalosporin use.    Vancomycin  Other (See Comments)    Edema and myalgia    Family History  Problem Relation Age of Onset   Stroke Mother    Coronary artery disease Father    Heart disease Father    Melanoma Sister    Colon cancer Neg Hx   Reviewed on admission  Prior to Admission medications   Medication Sig Start Date End Date Taking? Authorizing Provider  allopurinol  (ZYLOPRIM ) 100 MG tablet Take 1 tablet (100 mg total) by mouth daily. 01/08/23  Yes Kuneff, Renee A, DO  amLODipine  (NORVASC ) 5 MG tablet Take 1 tablet (5 mg total) by mouth daily. 06/24/23  Yes Kuneff, Renee A, DO  atorvastatin  (LIPITOR) 20 MG tablet Take 1 tablet (20 mg total) by mouth at bedtime. 06/24/23  Yes Kuneff,  Renee A, DO  carvedilol  (COREG ) 6.25 MG tablet Take 1 tablet (6.25 mg total) by mouth 2 (two) times daily with a meal. 06/24/23  Yes Kuneff, Renee A, DO  Cholecalciferol  125 MCG (5000 UT) capsule Take 5,000 Units by mouth daily.   Yes [provider]  dicyclomine  (BENTYL ) 10 MG capsule Take 1 capsule (10 mg total) by mouth every 6 (six) hours as needed. 06/24/23  Yes Kuneff, Renee A, DO  ELIQUIS  2.5 MG TABS tablet TAKE 1 TABLET BY MOUTH TWICE A DAY 06/16/23  Yes Mealor, Augustus E, MD  latanoprost  (XALATAN ) 0.005 % ophthalmic solution Place 1 drop into both eyes at bedtime. 07/14/18  Yes [provider]  Multiple Vitamins-Minerals (PRESERVISION AREDS 2) CAPS Take 1 capsule by mouth 2 (two) times daily.   Yes [provider]  Potassium Chloride  ER 20 MEQ TBCR Take 1 tablet (20 mEq total) by mouth 2 (two) times daily. 06/24/23  Yes Kuneff, Renee A, DO  tamsulosin  (FLOMAX ) 0.4 MG CAPS capsule Take 0.8 mg  by mouth at bedtime.   Yes [provider]  timolol  (TIMOPTIC ) 0.5 % ophthalmic solution Place 1 drop into both eyes 2 (two) times daily.  10/06/18  Yes [provider]  furosemide  (LASIX ) 40 MG tablet Take 40 mg by mouth 2 (two) times daily.    [provider]  Lancets [facility] ULTRASOFT) lancets Use to check sugar daily as instructed. 09/25/22   Kuneff, Renee A, DO  ONETOUCH VERIO test strip USE TO CHECK SUGAR DAILY AS INSTRUCTED. 11/02/23   Kuneff, Renee A, DO  OVER THE COUNTER MEDICATION Take 1 tablet by mouth 2 (two) times daily. MitoQ    [provider]  polyethylene glycol (MIRALAX ) 17 g packet Take 17 g by mouth daily. Patient not taking: Reported on 12/11/2023 08/18/23   Shane Steffan BROCKS, MD    Physical Exam: Vitals:   12/11/23 1153 12/11/23 1200 12/11/23 1300 12/11/23 1530  BP:  125/61 133/68 110/66  Pulse:  60 63 61  Resp:  10 15 16   Temp: (!) 96.7 F (35.9 C)     TempSrc: Temporal     SpO2:  96% 95% 96%  Weight:       Height:        Physical Exam Constitutional:      General: He is not in acute distress.    Appearance: He is ill-appearing.  HENT:     Head: Normocephalic and atraumatic.     Mouth/Throat:     Mouth: Mucous membranes are moist.     Pharynx: Oropharynx is clear.  Eyes:     Extraocular Movements: Extraocular movements intact.     Pupils: Pupils are equal, round, and reactive to light.  Cardiovascular:     Rate and Rhythm: Normal rate and regular rhythm.     Pulses: Normal pulses.     Heart sounds: Normal heart sounds.  Pulmonary:     Effort: No respiratory distress.     Comments: Mild decreased respiratory effort, distant sounds, questionable rales Abdominal:     General: Bowel sounds are normal. There is no distension.     Palpations: Abdomen is soft.     Tenderness: There is no abdominal tenderness.  Musculoskeletal:        General: No swelling or deformity.     Right lower leg: Edema present.     Left lower leg: Edema present.  Skin:    General: Skin is warm and dry.  Neurological:     General: No focal deficit present.     Mental Status: Mental status is at baseline.    Labs on Admission: I have personally reviewed following labs and imaging studies  CBC: Recent Labs  Lab 12/11/23 1236  WBC 6.1  NEUTROABS 4.2  HGB 9.3*  HCT 28.6*  MCV 95.3  PLT 95*    Basic Metabolic Panel: Recent Labs  Lab 12/11/23 1236  NA 135  K 6.5*  CL 116*  CO2 11*  GLUCOSE 100*  BUN 114*  CREATININE 4.45*  CALCIUM  8.6*  MG 2.1    GFR: Estimated Creatinine Clearance: 9.7 mL/min (A) (by C-G formula based on SCr of 4.45 mg/dL (H)).  Liver Function Tests: Recent Labs  Lab 12/11/23 1236  AST 26  ALT 22  ALKPHOS 76  BILITOT 0.7  PROT 7.2  ALBUMIN  3.0*    Urine analysis:    Component Value Date/Time   COLORURINE RED (A) 12/11/2023 1236   APPEARANCEUR CLOUDY (A) 12/11/2023 1236   LABSPEC 1.012 12/11/2023 1236  PHURINE 5.0 12/11/2023 1236   GLUCOSEU NEGATIVE  12/11/2023 1236   HGBUR MODERATE (A) 12/11/2023 1236   BILIRUBINUR NEGATIVE 12/11/2023 1236   KETONESUR NEGATIVE 12/11/2023 1236   PROTEINUR 100 (A) 12/11/2023 1236   UROBILINOGEN 0.2 10/21/2008 2146   NITRITE NEGATIVE 12/11/2023 1236   LEUKOCYTESUR LARGE (A) 12/11/2023 1236    Radiological Exams on Admission: CT Head Wo Contrast Result Date: 12/11/2023 EXAM: CT HEAD WITHOUT CONTRAST 12/11/2023 02:03:26 PM TECHNIQUE: CT of the head was performed without the administration of intravenous contrast. Automated exposure control, iterative reconstruction, and/or weight based adjustment of the mA/kV was utilized to reduce the radiation dose to as low as reasonably achievable. COMPARISON: 04/18/2023 CLINICAL HISTORY: Patient with altered mental status, reported by family to have decreased oral intake and difficulty waking. FINDINGS: BRAIN AND VENTRICLES: No acute hemorrhage. Gray-white differentiation is preserved. No hydrocephalus. No extra-axial collection. No mass effect or midline shift. Nonspecific hypoattenuation in the periventricular and subcortical white matter, most likely representing chronic small vessel disease. 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 Mild parenchymal volume loss. ORBITS: No acute abnormality. SINUSES: Mild mucosal thickening in the ethmoid and maxillary sinuses. SOFT TISSUES AND SKULL: No acute soft tissue abnormality. No skull fracture. Atherosclerosis at the skull base involving the carotid siphons and intracranial vertebral arteries. Additional scattered atherosclerosis of multiple vessels throughout the scalp. IMPRESSION: 1. No acute intracranial abnormality. 2. 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 3. Chronic small vessel disease and mild parenchymal volume loss. Electronically signed by: Donnice Mania MD 12/11/2023 03:15 PM EDT RP Workstation:  [id number]   DG Chest Portable 1 View Result Date: 12/11/2023 CLINICAL DATA:  SOB EXAM: PORTABLE CHEST - 1 VIEW COMPARISON:  April 18, 2023 FINDINGS: Bilateral perihilar interstitial opacities. Decreasing airspace opacities in the left mid lung. Small left pleural effusion with persistent left basilar atelectasis. No pneumothorax. Moderate cardiomegaly. Left chest pacemaker with leads terminating in the right atrium and right ventricle. Sternotomy wires and CABG markers. Tortuous aorta with aortic atherosclerosis. No acute fracture or destructive lesions. Multilevel thoracic osteophytosis. IMPRESSION: 1. Moderate cardiomegaly with findings of interstitial edema. 2. Decreasing size of the left pleural effusion, now small in volume, with left basilar atelectasis. Electronically Signed   By: Rogelia Myers M.D.   On: 12/11/2023 13:14   EKG: Independently reviewed.  Paced rhythm at 60 bpm.  Nonspecific T wave changes.  Baseline wander.  Assessment/Plan Principal Problem:   Hypothyroidism Active Problems:   CKD (chronic kidney disease) stage 4, GFR 15-29 ml/min (HCC)   Essential hypertension   A-fib (HCC)   Hyperlipidemia LDL goal <70   Gout   Iron deficiency anemia   Mobitz (type) I (Wenckebach's) atrioventricular block   CHF (congestive heart failure), NYHA class I, acute on chronic, diastolic (HCC)   Coronary artery disease of bypass graft of native heart with stable angina pectoris (HCC)   Cancer of anterior wall of urinary bladder (HCC)   Acute renal failure superimposed on stage 3a chronic kidney disease (HCC)   Confusion Weakness > Presenting for confusion and weakness.  May be combination of effects from AKI/progressive CKD with uremia and apparent new hypothyroidism. > Creatinine steadily worsening as below working to determine volume status and initial approach (fluids versus diuresis) > Will also need to rule out UTI. - Treat hypothyroidism and renal issues as below  New  hypothyroidism > Patient remains  alert and vital signs stable.  Sodium normal, temperature mildly low at 96.7. > TSH 110, T4 less than 0.25. > New from 2 years ago at least.  Will start with Synthroid  and labs to workup. - Monitoring on progressive unit - Synthroid  50 mcg daily - T3, prolactin, a.m. cortisol  AKI versus worsening CKD Uremia Hyperkalemia > Known to have worsening creatinine recently this is occurred after starting treatments for bladder cancer per family. > Follows with nephrology outpatient and Lasix  have been held as well as a renal ultrasound ordered.  Ultrasound not yet done. > Creatinine in the ED now 4.45 which is increased from 3.4 a month ago and 2.44 to 5 months ago.  BUN 114.  Potassium 6.5. > Patient has some mild edema/volume overload but has had decreased p.o. intake and family feel he is volume down from his baseline. > Will check BNP to further help with volume status > Received insulin  and dextrose  as well as calcium  gluconate in ED.  Also received 250 cc IV fluid. - Nephrology consult - BNP - Renal ultrasound - Trend renal function and electrolytes - Bicarb supplementation  ?  UTI > Urinalysis with hemoglobin protein, leukocytes, rare bacteria. - Received ceftriaxone  in the ED - Hold off on repeat antibiotics for now - Follow-up urine cultures  Hypertension - Holding amlodipine , Coreg , Lasix  for now.  Hyperlipidemia - Continue atorvastatin   Atrial fibrillation - Holding Coreg  - Continue Eliquis   Secondary AV block - Status post pacemaker  Chronic combined systolic and diastolic CHF > Last echo was in 2024 with EF 35-40%, indeterminate diastolic function mildly reduced RV function. > Lasix  recently held with worsening creatinine. > Volume status a little unclear appears clinically volume overloaded but family reports decreased p.o. intake and that his edema is better than it has been in the past. - BNP - Likely diuresis pending results of  BMP - Trend renal function and electrolytes  Anemia - Hemoglobin 9.3 down from 10-11 baseline, likely due to worsening renal function. - Trend renal CBC  Bladder cancer - Had followed with oncology, Dr. Timmy, family no longer pursuing treatment for this. > Some sediment versus clot noted in condom catheter bag. - Noted  DVT prophylaxis: Eliquis  Code Status:   DNR/DNI Family Communication:  Updated at bedside Disposition Plan:   Patient is from:  Home  Anticipated DC to:  Pending clinical course  Anticipated DC date:  2 to 5 days  Anticipated DC barriers: None  Consults called:  Nephrology Admission status:  Inpatient, progressive  Severity of Illness: The appropriate patient status for this patient is INPATIENT. Inpatient status is judged to be reasonable and necessary in order to provide the required intensity of service to ensure the patient's safety. The patient's presenting symptoms, physical exam findings, and initial radiographic and laboratory data in the context of their chronic comorbidities is felt to place them at high risk for further clinical deterioration. Furthermore, it is not anticipated that the patient will be medically stable for discharge from the hospital within 2 midnights of admission.   * I certify that at the point of admission it is my clinical judgment that the patient will require inpatient hospital care spanning beyond 2 midnights from the point of admission due to high intensity of service, high risk for further deterioration and high frequency of surveillance required.William Marsa KATHEE Seena MD [facility]  How to contact the [facility] Attending or Consulting provider 7A - 7P or covering provider during  after hours 7P -7A, for this patient?   Check the care team in [facility] and look for a) attending/consulting [facility] provider listed and b) the [facility] team listed Log into [email or web address] and use [place]'s universal password to access. If you do not have the  password, please contact the hospital operator. Locate the [facility] provider you are looking for under [facility] and page to a number that you can be directly reached. If you still have difficulty reaching the provider, please page the [facility] (Director on Call) for the Hospitalists listed on amion for assistance.  12/11/2023, 4:56 PM

## 2023-12-11 NOTE — ED Notes (Signed)
Shift report received, assumed care of patient at this time 

## 2023-12-11 NOTE — ED Provider Notes (Signed)
 [place] [facility]ERGENCY DEPARTMENT AT [facility] Provider Note   CSN: [id number] Arrival date & time: 12/11/23  1134     Patient presents with: Weakness   [name] is a 89 y.o. male past medical history significant for atrial fibrillation on Eliquis , IDA/erythropoietin  deficiency anemia on IV iron and Aranesp , CAD status post CABG, PPM 2019 who presents emergency department for generalized weakness, confusion and shortness of breath.  History is obtained from patient's family members.  Patient's family member states that the patient has been more confused than normal over the past 24 to 48 hours with generalized weakness.  The patient states that he has experienced 2 to 3 days of not feeling well, nausea, vomiting and inability to tolerate p.o. intake.  Patient's mother states that they have considered palliative care for the patient however at this time would want everything to be done for the patient and would like to continue with full evaluation.    Weakness      Prior to Admission medications   Medication Sig Start Date End Date Taking? Authorizing Provider  allopurinol  (ZYLOPRIM ) 100 MG tablet Take 1 tablet (100 mg total) by mouth daily. 01/08/23   Kuneff, Renee A, DO  amLODipine  (NORVASC ) 5 MG tablet Take 1 tablet (5 mg total) by mouth daily. 06/24/23   Kuneff, Renee A, DO  atorvastatin  (LIPITOR) 20 MG tablet Take 1 tablet (20 mg total) by mouth at bedtime. 06/24/23   Kuneff, Renee A, DO  carvedilol  (COREG ) 6.25 MG tablet Take 1 tablet (6.25 mg total) by mouth 2 (two) times daily with a meal. 06/24/23   Kuneff, Renee A, DO  Cholecalciferol  125 MCG (5000 UT) capsule Take 5,000 Units by mouth daily.    [provider]  dicyclomine  (BENTYL ) 10 MG capsule Take 1 capsule (10 mg total) by mouth every 6 (six) hours as needed. 06/24/23   Kuneff, Renee A, DO  ELIQUIS  2.5 MG TABS tablet TAKE 1 TABLET BY MOUTH TWICE A DAY 06/16/23   Mealor, Augustus E, MD  furosemide   (LASIX ) 40 MG tablet Take 40 mg by mouth 2 (two) times daily.    [provider]  Lancets [facility] ULTRASOFT) lancets Use to check sugar daily as instructed. 09/25/22   Kuneff, Renee A, DO  latanoprost  (XALATAN ) 0.005 % ophthalmic solution Place 1 drop into both eyes at bedtime. 07/14/18   [provider]  Multiple Vitamins-Minerals (PRESERVISION AREDS 2) CAPS Take 1 capsule by mouth 2 (two) times daily.    [provider]  [facility] VERIO test strip USE TO CHECK SUGAR DAILY AS INSTRUCTED. 11/02/23   Kuneff, Renee A, DO  OVER THE COUNTER MEDICATION Take 1 tablet by mouth 2 (two) times daily. MitoQ    [provider]  polyethylene glycol (MIRALAX ) 17 g packet Take 17 g by mouth daily. 08/18/23   Shane Steffan BROCKS, MD  Potassium Chloride  ER 20 MEQ TBCR Take 1 tablet (20 mEq total) by mouth 2 (two) times daily. 06/24/23   Kuneff, Renee A, DO  tamsulosin  (FLOMAX ) 0.4 MG CAPS capsule Take 0.8 mg by mouth at bedtime.    [provider]  timolol  (TIMOPTIC ) 0.5 % ophthalmic solution Place 1 drop into both eyes 2 (two) times daily.  10/06/18   [provider]    Allergies: Lisinopril , Penicillins, and Vancomycin     Review of Systems  Neurological:  Positive for weakness.    Updated Vital Signs BP 133/68   Pulse 63   Temp ROLLEN)  96.7 F (35.9 C) (Temporal)   Resp 15   Ht 5' 7 (1.702 m)   Wt 66 kg   SpO2 95%   BMI 22.79 kg/m   Physical Exam Vitals reviewed.  Constitutional:      Comments: GCS 14 secondary to confusion Chronically ill-appearing Responds easily to voice and is able to follow commands  HENT:     Head: Normocephalic and atraumatic.  Eyes:     General: No visual field deficit. Cardiovascular:     Rate and Rhythm: Normal rate.     Heart sounds: Normal heart sounds. No murmur heard. Pulmonary:     Effort: Pulmonary effort is normal. No tachypnea.     Breath sounds: Normal breath sounds. No wheezing or rales.  Abdominal:      General: There is no distension.     Palpations: Abdomen is soft.     Tenderness: There is no abdominal tenderness.  Musculoskeletal:     Cervical back: Normal range of motion.     Right lower leg: 1+ Pitting Edema present.     Left lower leg: 1+ Pitting Edema present.  Neurological:     Mental Status: He is confused.     Cranial Nerves: No cranial nerve deficit, dysarthria or facial asymmetry.     Sensory: Sensation is intact. No sensory deficit.     Motor: Motor function is intact.     Coordination: Finger-Nose-Finger Test abnormal.     Comments: GCS 14 secondary to confusion.  Alert and oriented to person however not place and time  Psychiatric:        Behavior: Behavior is cooperative.     (all labs ordered are listed, but only abnormal results are displayed) Labs Reviewed  CBC WITH DIFFERENTIAL/PLATELET - Abnormal; Notable for the following components:      Result Value   RBC 3.00 (*)    Hemoglobin 9.3 (*)    HCT 28.6 (*)    RDW 17.0 (*)    Platelets 95 (*)    All other components within normal limits  COMPREHENSIVE METABOLIC PANEL WITH GFR - Abnormal; Notable for the following components:   Potassium 6.5 (*)    Chloride 116 (*)    CO2 11 (*)    Glucose, Bld 100 (*)    BUN 114 (*)    Creatinine, Ser 4.45 (*)    Calcium  8.6 (*)    Albumin  3.0 (*)    GFR, Estimated 12 (*)    All other components within normal limits  TSH - Abnormal; Notable for the following components:   TSH 110.765 (*)    All other components within normal limits  T4, FREE - Abnormal; Notable for the following components:   Free T4 <0.25 (*)    All other components within normal limits  URINALYSIS, ROUTINE W REFLEX MICROSCOPIC - Abnormal; Notable for the following components:   Color, Urine RED (*)    APPearance CLOUDY (*)    Hgb urine dipstick MODERATE (*)    Protein, ur 100 (*)    Leukocytes,Ua LARGE (*)    Bacteria, UA RARE (*)    All other components within normal limits  SALICYLATE  LEVEL - Abnormal; Notable for the following components:   Salicylate Lvl <7.0 (*)    All other components within normal limits  ACETAMINOPHEN  LEVEL - Abnormal; Notable for the following components:   Acetaminophen  (Tylenol ), Serum <10 (*)    All other components within normal limits  I-STAT CG4 LACTIC ACID, ED -  Abnormal; Notable for the following components:   Lactic Acid, Venous 0.4 (*)    All other components within normal limits  I-STAT CG4 LACTIC ACID, ED - Abnormal; Notable for the following components:   Lactic Acid, Venous 0.4 (*)    All other components within normal limits  CBG MONITORING, ED - Abnormal; Notable for the following components:   Glucose-Capillary 165 (*)    All other components within normal limits  TROPONIN I (HIGH SENSITIVITY) - Abnormal; Notable for the following components:   Troponin I (High Sensitivity) 76 (*)    All other components within normal limits  URINE CULTURE  MAGNESIUM   RAPID URINE DRUG SCREEN, HOSP PERFORMED  AMMONIA  CK  CBG MONITORING, ED  CBG MONITORING, ED  TROPONIN I (HIGH SENSITIVITY)    EKG: EKG Interpretation Date/Time:  Friday December 11 2023 11:45:30 EDT Ventricular Rate:  60 PR Interval:  66 QRS Duration:  207 QT Interval:  543 QTC Calculation: 543 R Axis:   254  Text Interpretation: Ventricular-paced rhythm No further analysis attempted due to paced rhythm Confirmed by Neysa Clap [phone]) on 12/11/2023 1:46:32 PM  Radiology: CT Head Wo Contrast Result Date: 12/11/2023 EXAM: CT HEAD WITHOUT CONTRAST 12/11/2023 02:03:26 PM TECHNIQUE: CT of the head was performed without the administration of intravenous contrast. Automated exposure control, iterative reconstruction, and/or weight based adjustment of the mA/kV was utilized to reduce the radiation dose to as low as reasonably achievable. COMPARISON: 04/18/2023 CLINICAL HISTORY: Patient with altered mental status, reported by family to have decreased oral intake and  difficulty waking. FINDINGS: BRAIN AND VENTRICLES: No acute hemorrhage. Gray-white differentiation is preserved. No hydrocephalus. No extra-axial collection. No mass effect or midline shift. Nonspecific hypoattenuation in the periventricular and subcortical white matter, most likely representing chronic small vessel disease. 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 Mild parenchymal volume loss. ORBITS: No acute abnormality. SINUSES: Mild mucosal thickening in the ethmoid and maxillary sinuses. SOFT TISSUES AND SKULL: No acute soft tissue abnormality. No skull fracture. Atherosclerosis at the skull base involving the carotid siphons and intracranial vertebral arteries. Additional scattered atherosclerosis of multiple vessels throughout the scalp. IMPRESSION: 1. No acute intracranial abnormality. 2. 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 3. Chronic small vessel disease and mild parenchymal volume loss. Electronically signed by: Donnice Mania MD 12/11/2023 03:15 PM EDT RP Workstation: [id number]   DG Chest Portable 1 View Result Date: 12/11/2023 CLINICAL DATA:  SOB EXAM: PORTABLE CHEST - 1 VIEW COMPARISON:  April 18, 2023 FINDINGS: Bilateral perihilar interstitial opacities. Decreasing airspace opacities in the left mid lung. Small left pleural effusion with persistent left basilar atelectasis. No pneumothorax. Moderate cardiomegaly. Left chest pacemaker with leads terminating in the right atrium and right ventricle. Sternotomy wires and CABG markers. Tortuous aorta with aortic atherosclerosis. No acute fracture or destructive lesions. Multilevel thoracic osteophytosis. IMPRESSION: 1. Moderate cardiomegaly with findings of interstitial edema. 2. Decreasing size of the left pleural effusion, now small in volume, with left basilar atelectasis. Electronically Signed   By: Rogelia Myers M.D.    On: 12/11/2023 13:14     Procedures   Medications Ordered in the ED  cefTRIAXone  (ROCEPHIN ) 1 g in sodium chloride  0.9 % 100 mL IVPB (1 g Intravenous New Bag/Given 12/11/23 1451)  lactated ringers  bolus 250 mL (250 mLs Intravenous New Bag/Given 12/11/23 1442)  insulin  aspart (novoLOG ) injection 10 Units (10 Units  Intravenous Given 12/11/23 1442)    And  dextrose  50 % solution 50 mL (50 mLs Intravenous Given 12/11/23 1442)  calcium  gluconate inj 10% (1 g) URGENT USE ONLY! (1 g Intravenous Given 12/11/23 1442)    Clinical Course as of 12/11/23 1520  Fri Dec 11, 2023  1245 Chest x-ray reviewed by me: Trachea is midline.  Cardiomegaly present.  No right pleural effusion however difficult to assess left costophrenic angle.  No obvious focal consolidation [AG]  1505 S- uti hyper k, hypothyroid admit   [RC]    Clinical Course User Index [AG] Nada Chroman, DO [RC] Sharyne Darina RAMAN, MD                                 Medical Decision Making Amount and/or Complexity of Data Reviewed Labs: ordered. Radiology: ordered.  Risk OTC drugs. Prescription drug management. Decision regarding hospitalization.   On initial evaluation patient is mildly hypothermic however hemodynamically stable and saturating well on room air.  Patient is alert and able to follow commands appropriately.  Based on patient's history and physical examination findings differential diagnosis includes altered mental status secondary to CVA/TIA, electrolyte abnormality, dehydration, anemia, ACS/MI, infection, polypharmacy.  Will obtain laboratory studies as well as CT head and chest x-ray imaging.  Chest x-ray without evidence of pneumonia pneumothorax.  Urinalysis is significant for leukocytosis with hematuria therefore we will give her Rocephin  in the setting of UTI.  Will give small fluid bolus with history of HFpEF.  Laboratory studies significant for hyperkalemia, EKG difficult to interpret however no obvious EKG  changes.  In the setting of hyperkalemia will give calcium  gluconate as well as insulin .  TSH significantly elevated and free T4 low however do not believe patient presentation is secondary to myxedema coma as patient is alert, not hypotensive, not bradycardic.  Patient did present with lethargy noted by family members and noted to be hypothermic therefore will discuss with admitting team regarding treatment for hypothyroidism. CTH reviewed by me with evidence of possible motion artifact and less likely ICH as patient has no focal neurologic deficits on examination..  At the time of admission patient and patient's family member's updated on plan of care and had no further questions.     Final diagnoses:  Hyperkalemia  Acute cystitis with hematuria  Hypothyroidism, unspecified type  Lethargy    ED Discharge Orders     None       Chroman Nada DO Emergency Medicine PGY2    Ruberta Holck, DO 12/11/23 1520    Neysa Caron PARAS, [place] 12/11/23 1610

## 2023-12-11 NOTE — Telephone Encounter (Signed)
 Copied from CRM [id number]. Topic: Clinical - Medical Advice >> Dec 11, 2023  9:26 AM Franky GRADE wrote: Reason for CRM: Patient's daughter is calling wanting to speak with Charlies Bellini to see if it was a good idea to start palliative care.

## 2023-12-11 NOTE — ED Notes (Signed)
 Patient noted to have bloody urine with clots once condom cath was placed.

## 2023-12-11 NOTE — Consult Note (Signed)
 [facility]  HISTORY AND PHYSICAL  [name] is an 89 y.o. male.    Chief Complaint: weakness/ confusion  HPI: Pt is a 66M with CKD IV followed by Dr Marlee, HTN, HLD, Afib, CAD s/p CABG and PPM, h/o bladder cancer s/p BCG, and CHF who is now seen in consultation at the request of Dr Seena for AKI on CKD IV, hyperkalemia, and multiple metabolic abormalities.    Pt presents with son. Been having more increased SOB and weakness for the past couple of days.  Recently finished BCG treatment for bladder cancer and has been having some difficulty urinating.  Burning when urinating.  Also has been having poor PO intake and some loose stools.  Cr has risen from 2.5 baseline to 4.45 12/08/23.  Saw Dr Marlee 12/08/23 and was taken off torsemide.    In ED, BUN 114, Cr 4.45, K 6.5, CO2 11.  Albumin  3.0 TSH 110 with undetectable T4.  Free T3 pending.  Urine looks bloody/ possibly infected.   In this setting we are asked to see.    Pt reports no other complaints than some back pain.  Son is with him in the room who helps provide the history.    PMH: Past Medical History:  Diagnosis Date   A-fib (HCC)    Acute on chronic heart failure with preserved ejection fraction (HFpEF) (HCC) 04/18/2023   Basal cell carcinoma    skin   Bigeminal rhythm    Bradycardia 06/21/2017   Cancer of anterior wall of urinary bladder (HCC) 07/18/2019   Cataract    Chronic renal insufficiency, stage 3 (moderate) (HCC) 2018   GFR 30s-40s   Coronary artery disease    post bypass   CVD (cardiovascular disease)    Diabetes mellitus without complication (HCC)    type 2 diet controlled   Diverticular disease    Easy bruising    Erythropoietin  deficiency anemia 02/04/2016   Family history of adverse reaction to anesthesia    daugther PONV   Gout    Hematuria 06/30/2019   Hernia    Hyperkalemia 05/06/2017   Hyperlipidemia    Hypertension    IBS (irritable bowel syndrome)    Iron deficiency  anemia 02/04/2016   Macular degeneration    Microscopic colitis    PVD (peripheral vascular disease) (HCC)    PSH: Past Surgical History:  Procedure Laterality Date   CARDIAC CATHETERIZATION  2006   CARDIOVERSION N/A 02/22/2013   Procedure: CARDIOVERSION;  Surgeon: Maude JAYSON Emmer, MD;  Location: [facility] ENDOSCOPY;  Service: Cardiovascular;  Laterality: N/A;   CARDIOVERSION N/A 11/15/2014   Procedure: CARDIOVERSION;  Surgeon: Maude JAYSON Emmer, MD;  Location[facility] ENDOSCOPY;  Service: Cardiovascular;  Laterality: N/A;   CARDIOVERSION N/A 04/01/2017   Procedure: CARDIOVERSION;  Surgeon: Rolan Ezra RAMAN, MD;  Location: [facility] ENDOSCOPY;  Service: Cardiovascular;  Laterality: N/A;   CAROTID ENDARTERECTOMY  2009/ 1993   left/ right   COLONOSCOPY  03/17/2005   The colon is normal.   CORONARY ARTERY BYPASS GRAFT  1999   CYSTOSCOPY W/ RETROGRADES Bilateral 07/14/2019   Procedure: CYSTOSCOPY WITH RETROGRADE PYELOGRAM;  Surgeon: Matilda Senior, MD;  Location: [facility];  Service: Urology;  Laterality: Bilateral;   ESOPHAGOGASTRODUODENOSCOPY  03/17/2005   Normal esophagus. Normal Stomanch. Normal duodenum.    EYE SURGERY     eyelid repair   INGUINAL HERNIA REPAIR  02/11/2012   Procedure: LAPAROSCOPIC BILATERAL INGUINAL HERNIA REPAIR;  Surgeon: Donnice KATHEE Lunger, MD;  Location: [facility];  Service: General;  Laterality: Bilateral;   PACEMAKER IMPLANT N/A 07/28/2017   [employer] Assurity MRI conditional  dual-chamber pacemaker for symptomatic second degree AV block by Dr Kelsie   SKIN CANCER EXCISION     right ear x 3   TRANSURETHRAL RESECTION OF BLADDER TUMOR N/A 09/05/2019   Procedure: TRANSURETHRAL RESECTION OF BLADDER TUMOR (TURBT);  Surgeon: Matilda Senior, MD;  Location[facility];  Service: Urology;  Laterality: N/A;   TRANSURETHRAL RESECTION OF BLADDER TUMOR WITH MITOMYCIN -C N/A 07/14/2019   Procedure: TRANSURETHRAL RESECTION OF BLADDER TUMOR WITH GEMCITABINE  IN  PACU;  Surgeon: Matilda Senior, MD;  Location: [facility];  Service: Urology;  Laterality: N/A;    Past Medical History:  Diagnosis Date   A-fib (HCC)    Acute on chronic heart failure with preserved ejection fraction (HFpEF) (HCC) 04/18/2023   Basal cell carcinoma    skin   Bigeminal rhythm    Bradycardia 06/21/2017   Cancer of anterior wall of urinary bladder (HCC) 07/18/2019   Cataract    Chronic renal insufficiency, stage 3 (moderate) (HCC) 2018   GFR 30s-40s   Coronary artery disease    post bypass   CVD (cardiovascular disease)    Diabetes mellitus without complication (HCC)    type 2 diet controlled   Diverticular disease    Easy bruising    Erythropoietin  deficiency anemia 02/04/2016   Family history of adverse reaction to anesthesia    daugther PONV   Gout    Hematuria 06/30/2019   Hernia    Hyperkalemia 05/06/2017   Hyperlipidemia    Hypertension    IBS (irritable bowel syndrome)    Iron deficiency anemia 02/04/2016   Macular degeneration    Microscopic colitis    PVD (peripheral vascular disease) (HCC)     Medications:  Scheduled:  apixaban   2.5 mg Oral BID   atorvastatin   20 mg Oral QHS   Chlorhexidine  Gluconate Cloth  6 each Topical Daily   levothyroxine   50 mcg Oral Q0600   sodium chloride  flush  3 mL Intravenous Q12H   sodium zirconium cyclosilicate   10 g Oral TID   tamsulosin   0.8 mg Oral QHS    (Not in a hospital admission)   ALLERGIES:   Allergies  Allergen Reactions   Lisinopril  Other (See Comments)     he has difficulty controlling potassium on ace/arb- lisinopril  was dc'd in the past 2/2 to hyperkalemia documented by [facility] PCP 01/2021   Penicillins Hives    Has patient had a PCN reaction causing immediate rash, facial/tongue/throat swelling, SOB or lightheadedness with hypotension: No Has patient had a PCN reaction causing severe rash involving mucus membranes or skin necrosis: Yes Has patient had a PCN reaction  that required hospitalization: No Has patient had a PCN reaction occurring within the last 10 years: No If all of the above answers are NO, then may proceed with Cephalosporin use.    Vancomycin  Other (See Comments)    Edema and myalgia    FAM HX: Family History  Problem Relation Age of Onset   Stroke Mother    Coronary artery disease Father    Heart disease Father    Melanoma Sister    Colon cancer Neg Hx     Social History:   reports that he has quit smoking. He quit smokeless tobacco use about 54 years ago.  His smokeless tobacco use included chew. He reports that he does not drink alcohol  and does not use drugs.  ROS: ROS: all other systems reviewed and are negative except as per HPI  Blood pressure 105/61, pulse 64, temperature (!) 96.7 F (35.9 C), temperature source Temporal, resp. rate 19, height 5' 7 (1.702 m), weight 66 kg, SpO2 96%. PHYSICAL EXAM: Physical Exam GEN NAD, sitting in bed HEENT HOH, bandage on R ear NECK no JVD PULM faint crackles bilateral bases CV RRR ABD soft, slightly bulging flanks GU: condom cath in place, serosanginous urine EXT 1+ ankle edema NEURO AAO x 3 no asterixis SKIN _ multiple AK MSK no effusions    Results for orders placed or performed during the hospital encounter of 12/11/23 (from the past 48 hours)  CBG monitoring, ED     Status: None   Collection Time: 12/11/23 11:47 AM  Result Value Ref Range   Glucose-Capillary 85 70 - 99 mg/dL    Comment: Glucose reference range applies only to samples taken after fasting for at least 8 hours.  CBC with Differential     Status: Abnormal   Collection Time: 12/11/23 12:36 PM  Result Value Ref Range   WBC 6.1 4.0 - 10.5 K/uL   RBC 3.00 (L) 4.22 - 5.81 MIL/uL   Hemoglobin 9.3 (L) 13.0 - 17.0 g/dL   HCT 71.3 (L) 60.9 - 47.9 %   MCV 95.3 80.0 - 100.0 fL   MCH 31.0 26.0 - 34.0 pg   MCHC 32.5 30.0 - 36.0 g/dL   RDW 82.9 (H) 88.4 - 84.4 %   Platelets 95 (L) 150 - 400 K/uL     Comment: REPEATED TO VERIFY PLATELET COUNT CONFIRMED BY SMEAR Immature Platelet Fraction may be clinically indicated, consider ordering this additional test [id number]    nRBC 0.0 0.0 - 0.2 %   Neutrophils Relative % 69 %   Neutro Abs 4.2 1.7 - 7.7 K/uL   Lymphocytes Relative 22 %   Lymphs Abs 1.4 0.7 - 4.0 K/uL   Monocytes Relative 6 %   Monocytes Absolute 0.4 0.1 - 1.0 K/uL   Eosinophils Relative 2 %   Eosinophils Absolute 0.1 0.0 - 0.5 K/uL   Basophils Relative 0 %   Basophils Absolute 0.0 0.0 - 0.1 K/uL   WBC Morphology MORPHOLOGY UNREMARKABLE    RBC Morphology MORPHOLOGY UNREMARKABLE    Smear Review Normal platelet morphology    Immature Granulocytes 1 %   Abs Immature Granulocytes 0.04 0.00 - 0.07 K/uL   Abs Granulocyte 4.2 1.5 - 6.5 K/uL    Comment: Performed at [facility], [address], [place], [place] [address]  Comprehensive metabolic panel     Status: Abnormal   Collection Time: 12/11/23 12:36 PM  Result Value Ref Range   Sodium 135 135 - 145 mmol/L   Potassium 6.5 (HH) 3.5 - 5.1 mmol/L    Comment: CRITICAL RESULT CALLED TO, READ BACK BY AND VERIFIED WITH K.BRANCH,PARAMEDIC @1353  12/11/2023 [email or web address].J   Chloride 116 (H) 98 - 111 mmol/L   CO2 11 (L) 22 - 32 mmol/L   Glucose, Bld 100 (H) 70 - 99 mg/dL    Comment: Glucose reference range applies only to samples taken after fasting for at least 8 hours.   BUN 114 (H) 8 - 23 mg/dL   Creatinine, Ser 5.54 (H) 0.61 - 1.24 mg/dL   Calcium  8.6 (L) 8.9 - 10.3 mg/dL   Total Protein 7.2 6.5 - 8.1 g/dL   Albumin  3.0 (L) 3.5 - 5.0 g/dL   AST 26 15 -  41 U/L   ALT 22 0 - 44 U/L   Alkaline Phosphatase 76 38 - 126 U/L   Total Bilirubin 0.7 0.0 - 1.2 mg/dL   GFR, Estimated 12 (L) >60 mL/min    Comment: (NOTE) Calculated using the CKD-EPI Creatinine Equation (2021)    Anion gap 8 5 - 15    Comment: Performed at [facility], [address], [place], [place] [address]  Magnesium      Status: None   Collection Time:  12/11/23 12:36 PM  Result Value Ref Range   Magnesium  2.1 1.7 - 2.4 mg/dL    Comment: Performed at [facility], [address] [place], [place] [address]  TSH     Status: Abnormal   Collection Time: 12/11/23 12:36 PM  Result Value Ref Range   TSH 110.765 (H) 0.350 - 4.500 uIU/mL    Comment: Performed by a 3rd Generation assay with a functional sensitivity of <=0.01 uIU/mL. Performed at [facility], [address], [place], [place] [address]   T4, free     Status: Abnormal   Collection Time: 12/11/23 12:36 PM  Result Value Ref Range   Free T4 <0.25 (L) 0.61 - 1.12 ng/dL    Comment: (NOTE) Biotin ingestion may interfere with free T4 tests. If the results are inconsistent with the TSH level, previous test results, or the clinical presentation, then consider biotin interference. If needed, order repeat testing after stopping biotin. Performed at [facility], [address] [place], [place] [address]   Urinalysis, Routine w reflex microscopic -Urine, Catheterized     Status: Abnormal   Collection Time: 12/11/23 12:36 PM  Result Value Ref Range   Color, Urine RED (A) YELLOW    Comment: BIOCHEMICALS MAY BE AFFECTED BY COLOR   APPearance CLOUDY (A) CLEAR   Specific Gravity, Urine 1.012 1.005 - 1.030   pH 5.0 5.0 - 8.0   Glucose, UA NEGATIVE NEGATIVE mg/dL   Hgb urine dipstick MODERATE (A) NEGATIVE   Bilirubin Urine NEGATIVE NEGATIVE   Ketones, ur NEGATIVE NEGATIVE mg/dL   Protein, ur 899 (A) NEGATIVE mg/dL   Nitrite NEGATIVE NEGATIVE   Leukocytes,Ua LARGE (A) NEGATIVE   RBC / HPF >50 0 - 5 RBC/hpf   WBC, UA >50 0 - 5 WBC/hpf   Bacteria, UA RARE (A) NONE SEEN   Squamous Epithelial / HPF 0-5 0 - 5 /HPF   WBC Clumps PRESENT     Comment: Performed at [facility], [address] [place], [place] [address]  Rapid urine drug screen (hospital performed)     Status: None   Collection Time: 12/11/23 12:36 PM  Result Value Ref Range   Opiates NONE DETECTED  NONE DETECTED   Cocaine NONE DETECTED NONE DETECTED   Benzodiazepines NONE DETECTED NONE DETECTED   Amphetamines NONE DETECTED NONE DETECTED   Tetrahydrocannabinol NONE DETECTED NONE DETECTED   Barbiturates NONE DETECTED NONE DETECTED    Comment: (NOTE) DRUG SCREEN FOR MEDICAL PURPOSES ONLY.  IF CONFIRMATION IS NEEDED FOR ANY PURPOSE, NOTIFY LAB WITHIN 5 DAYS.  LOWEST DETECTABLE LIMITS FOR URINE DRUG SCREEN Drug Class                     Cutoff (ng/mL) Amphetamine and metabolites    1000 Barbiturate and metabolites    200 Benzodiazepine                 200 Opiates and metabolites  300 Cocaine and metabolites        300 THC                            50 Performed at [facility], [address] [place], [place] [address]   Salicylate level     Status: Abnormal   Collection Time: 12/11/23 12:36 PM  Result Value Ref Range   Salicylate Lvl <7.0 (L) 7.0 - 30.0 mg/dL    Comment: Performed at [facility], [address] [place], [place] [address]  Acetaminophen  level     Status: Abnormal   Collection Time: 12/11/23 12:36 PM  Result Value Ref Range   Acetaminophen  (Tylenol ), Serum <10 (L) 10 - 30 ug/mL    Comment: (NOTE) Therapeutic concentrations vary significantly. A range of 10-30 ug/mL  may be an effective concentration for many patients. However, some  are best treated at concentrations outside of this range. Acetaminophen  concentrations >150 ug/mL at 4 hours after ingestion  and >50 ug/mL at 12 hours after ingestion are often associated with  toxic reactions.  Performed at [facility], [address], [place], [place] [address]   Ammonia     Status: None   Collection Time: 12/11/23 12:36 PM  Result Value Ref Range   Ammonia <13 9 - 35 umol/L    Comment: Performed at [facility], [address] [place], [place] [address]  Troponin I (High Sensitivity)     Status: Abnormal   Collection Time: 12/11/23 12:36 PM  Result Value Ref Range    Troponin I (High Sensitivity) 76 (H) <18 ng/L    Comment: (NOTE) Elevated high sensitivity troponin I (hsTnI) values and significant  changes across serial measurements may suggest ACS but many other  chronic and acute conditions are known to elevate hsTnI results.  Refer to the Links section for chest pain algorithms and additional  guidance. Performed at [facility], [address], [place], [place] [address]   I-Stat CG4 Lactic Acid     Status: Abnormal   Collection Time: 12/11/23 12:52 PM  Result Value Ref Range   Lactic Acid, Venous 0.4 (L) 0.5 - 1.9 mmol/L  CBG monitoring, ED     Status: None   Collection Time: 12/11/23  2:36 PM  Result Value Ref Range   Glucose-Capillary 88 70 - 99 mg/dL    Comment: Glucose reference range applies only to samples taken after fasting for at least 8 hours.  CK     Status: None   Collection Time: 12/11/23  2:47 PM  Result Value Ref Range   Total CK 80 49 - 397 U/L    Comment: Performed at [facility], [address] [place], [place] [address]  Troponin I (High Sensitivity)     Status: Abnormal   Collection Time: 12/11/23  2:47 PM  Result Value Ref Range   Troponin I (High Sensitivity) 76 (H) <18 ng/L    Comment: (NOTE) Elevated high sensitivity troponin I (hsTnI) values and significant  changes across serial measurements may suggest ACS but many other  chronic and acute conditions are known to elevate hsTnI results.  Refer to the Links section for chest pain algorithms and additional  guidance. Performed at [facility], [address] [place], [place] [address]   I-Stat CG4 Lactic Acid     Status: Abnormal   Collection Time: 12/11/23  2:52  PM  Result Value Ref Range   Lactic Acid, Venous 0.4 (L) 0.5 - 1.9 mmol/L  CBG monitoring, ED     Status: Abnormal   Collection Time: 12/11/23  3:08 PM  Result Value Ref Range   Glucose-Capillary 165 (H) 70 - 99 mg/dL    Comment: Glucose reference range applies only to  samples taken after fasting for at least 8 hours.    US  RENAL Result Date: 12/11/2023 CLINICAL DATA:  Acute kidney injury EXAM: RENAL / URINARY TRACT ULTRASOUND COMPLETE COMPARISON:  None Available. FINDINGS: Right Kidney: Renal measurements: 8.8 x 4.7 x 4.4 cm = volume: 105 mL. Echogenic parenchyma and cortical thinning no mas, nephrolithiasis s or hydronephrosis visualized. Left Kidney: Renal measurements: 10.6 x 5.3 x 5.6 cm = volume: 165 mL. Echogenic parenchyma and cortical thinning. Mild fullness of the collecting system. No renal mass or nephrolithiasis. Bladder: Layering sludge within the bladder.  Normal wall thickness. Other: Intra-abdominal ascites. Bilateral pleural effusion. Nodular liver morphology. Prostatomegaly measuring 5.4 x 4.2 x 4.3 cm (51 cc) IMPRESSION: Increased parenchymal echogenicity of the kidneys suggestive of medical renal disease. Correlate with renal function test. Mild fullness of the left collecting system. Ascites and bilateral pleural effusion. Layering sludge within the bladder lumen. Prostatomegaly. Electronically Signed   By: Megan  Zare M.D.   On: 12/11/2023 19:02   CT Head Wo Contrast Result Date: 12/11/2023 EXAM: CT HEAD WITHOUT CONTRAST 12/11/2023 02:03:26 PM TECHNIQUE: CT of the head was performed without the administration of intravenous contrast. Automated exposure control, iterative reconstruction, and/or weight based adjustment of the mA/kV was utilized to reduce the radiation dose to as low as reasonably achievable. COMPARISON: 04/18/2023 CLINICAL HISTORY: Patient with altered mental status, reported by family to have decreased oral intake and difficulty waking. FINDINGS: BRAIN AND VENTRICLES: No acute hemorrhage. Gray-white differentiation is preserved. No hydrocephalus. No extra-axial collection. No mass effect or midline shift. Nonspecific hypoattenuation in the periventricular and subcortical white matter, most likely representing chronic small vessel  disease. 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 Mild parenchymal volume loss. ORBITS: No acute abnormality. SINUSES: Mild mucosal thickening in the ethmoid and maxillary sinuses. SOFT TISSUES AND SKULL: No acute soft tissue abnormality. No skull fracture. Atherosclerosis at the skull base involving the carotid siphons and intracranial vertebral arteries. Additional scattered atherosclerosis of multiple vessels throughout the scalp. IMPRESSION: 1. No acute intracranial abnormality. 2. 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 3. Chronic small vessel disease and mild parenchymal volume loss. Electronically signed by: Donnice Mania MD 12/11/2023 03:15 PM EDT RP Workstation: [id number]   DG Chest Portable 1 View Result Date: 12/11/2023 CLINICAL DATA:  SOB EXAM: PORTABLE CHEST - 1 VIEW COMPARISON:  April 18, 2023 FINDINGS: Bilateral perihilar interstitial opacities. Decreasing airspace opacities in the left mid lung. Small left pleural effusion with persistent left basilar atelectasis. No pneumothorax. Moderate cardiomegaly. Left chest pacemaker with leads terminating in the right atrium and right ventricle. Sternotomy wires and CABG markers. Tortuous aorta with aortic atherosclerosis. No acute fracture or destructive lesions. Multilevel thoracic osteophytosis. IMPRESSION: 1. Moderate cardiomegaly with findings of interstitial edema. 2. Decreasing size of the left pleural effusion, now small in volume, with left basilar atelectasis. Electronically Signed   By: Rogelia Myers M.D.   On: 12/11/2023 13:14    Assessment/Plan  AKI on CKD IV: rising over time.  Poor PO intake, off torsemide, history  of BCG which recently finished - renal US  looks like potential developing obstruction- will go ahead and place Foley.  Small clot in bagfrom condom cath, hopefully will not need bladder  irrigation  - gentle bicarb gtt- will need to be judicious with this   - BUN high, not uremic, hopefully can correct with conservative measures  2.  Hyperkalemia  - got calcium , insulin / dextrose   - will give lokelma  and bicarb  3.  Anemia:  - hgb 9.3  - on ESA, will provide when appropriate  4.  Metabolic acidosis:  - gentle bicarb gtt  5.  Hypothyroidism: profound.  TSH > 110.  - does not appear to have myxedema coma  - PO levothyroxine   - AM cortisol pending  6. Bladder cancer s/p BCG  - Foley placement for now  - if lots of blood/ clot, may need to involve urology and consider irrigation  7.  Dispo: being admitted  [name] 12/11/2023, 7:33 PM

## 2023-12-11 NOTE — ED Notes (Signed)
 Floor notified of patient being transferred to floor.

## 2023-12-11 NOTE — Telephone Encounter (Signed)
 I would encourage him to make an appointment to discuss.  I seen the patient last in February, therefore would be difficult to recommend palliative care at this point without further evaluation and discussion as desires and needs.  From EMR review, he may be in the emergency room.

## 2023-12-12 DIAGNOSIS — N3001 Acute cystitis with hematuria: Secondary | ICD-10-CM | POA: Diagnosis not present

## 2023-12-12 DIAGNOSIS — E039 Hypothyroidism, unspecified: Secondary | ICD-10-CM | POA: Diagnosis not present

## 2023-12-12 DIAGNOSIS — I10 Essential (primary) hypertension: Secondary | ICD-10-CM | POA: Diagnosis not present

## 2023-12-12 DIAGNOSIS — E785 Hyperlipidemia, unspecified: Secondary | ICD-10-CM | POA: Diagnosis not present

## 2023-12-12 LAB — COMPREHENSIVE METABOLIC PANEL WITH GFR
ALT: 20 U/L (ref 0–44)
AST: 25 U/L (ref 15–41)
Albumin: 2.8 g/dL — ABNORMAL LOW (ref 3.5–5.0)
Alkaline Phosphatase: 65 U/L (ref 38–126)
Anion gap: 11 (ref 5–15)
BUN: 110 mg/dL — ABNORMAL HIGH (ref 8–23)
CO2: 12 mmol/L — ABNORMAL LOW (ref 22–32)
Calcium: 8.5 mg/dL — ABNORMAL LOW (ref 8.9–10.3)
Chloride: 114 mmol/L — ABNORMAL HIGH (ref 98–111)
Creatinine, Ser: 4.37 mg/dL — ABNORMAL HIGH (ref 0.61–1.24)
GFR, Estimated: 12 mL/min — ABNORMAL LOW (ref >60)
Glucose, Bld: 173 mg/dL — ABNORMAL HIGH (ref 70–99)
Potassium: 5.1 mmol/L (ref 3.5–5.1)
Sodium: 137 mmol/L (ref 135–145)
Total Bilirubin: 0.6 mg/dL (ref 0.0–1.2)
Total Protein: 6.8 g/dL (ref 6.5–8.1)

## 2023-12-12 LAB — CBC
HCT: 27.9 % — ABNORMAL LOW (ref 39.0–52.0)
Hemoglobin: 8.8 g/dL — ABNORMAL LOW (ref 13.0–17.0)
MCH: 30.3 pg (ref 26.0–34.0)
MCHC: 31.5 g/dL (ref 30.0–36.0)
MCV: 96.2 fL (ref 80.0–100.0)
Platelets: 110 K/uL — ABNORMAL LOW (ref 150–400)
RBC: 2.9 MIL/uL — ABNORMAL LOW (ref 4.22–5.81)
RDW: 17.1 % — ABNORMAL HIGH (ref 11.5–15.5)
WBC: 6.1 K/uL (ref 4.0–10.5)
nRBC: 0 % (ref 0.0–0.2)

## 2023-12-12 LAB — POTASSIUM: Potassium: 5.5 mmol/L — ABNORMAL HIGH (ref 3.5–5.1)

## 2023-12-12 LAB — CORTISOL-AM, BLOOD: Cortisol - AM: 15.2 ug/dL (ref 6.7–22.6)

## 2023-12-12 MED ORDER — SODIUM BICARBONATE 650 MG PO TABS
1300.0000 mg | ORAL_TABLET | Freq: Two times a day (BID) | ORAL | Status: DC
  Administered 2023-12-12: 1300 mg via ORAL
  Filled 2023-12-12 (×3): qty 2

## 2023-12-12 MED ORDER — SODIUM CHLORIDE 0.9 % IV SOLN
1.0000 g | INTRAVENOUS | Status: DC
  Administered 2023-12-12: 1 g via INTRAVENOUS
  Filled 2023-12-12: qty 10

## 2023-12-12 MED ORDER — HYDROMORPHONE HCL 1 MG/ML IJ SOLN
0.5000 mg | INTRAMUSCULAR | Status: DC | PRN
  Administered 2023-12-12 – 2023-12-13 (×3): 0.5 mg via INTRAVENOUS
  Filled 2023-12-12 (×3): qty 0.5

## 2023-12-12 MED ORDER — HALOPERIDOL LACTATE 5 MG/ML IJ SOLN
2.0000 mg | Freq: Four times a day (QID) | INTRAMUSCULAR | Status: DC | PRN
  Administered 2023-12-12: 2 mg via INTRAMUSCULAR
  Filled 2023-12-12: qty 1

## 2023-12-12 MED ORDER — ENSURE PLUS HIGH PROTEIN PO LIQD
237.0000 mL | Freq: Two times a day (BID) | ORAL | Status: DC
  Administered 2023-12-12: 237 mL via ORAL

## 2023-12-12 MED ORDER — LORAZEPAM 2 MG/ML IJ SOLN: 1.0000 mg | INTRAMUSCULAR | Status: DC | PRN

## 2023-12-12 MED ORDER — HALOPERIDOL LACTATE 5 MG/ML IJ SOLN
5.0000 mg | Freq: Four times a day (QID) | INTRAMUSCULAR | Status: DC | PRN
  Administered 2023-12-12 – 2023-12-13 (×2): 5 mg via INTRAMUSCULAR
  Filled 2023-12-12 (×2): qty 1

## 2023-12-12 NOTE — Progress Notes (Signed)
 [facility] Progress Note   Assessment/ Plan:   AKI on CKD IV: rising over time.  Poor PO intake, off torsemide, history of BCG which recently finished - foley placed - RUS with possible L collecting system fullness but not a huge amount of output s/p Foley             - gentle bicarb gtt- will need to be judicious with this              - no dialysis- confirmed with son  - treat the treatable for 48-72 hrs   2.  Hyperkalemia             - got calcium , insulin / dextrose              - will give lokelma  and bicarb   3.  Anemia:             - hgb 9.3             - on ESA, will provide when appropriate   4.  Metabolic acidosis:             - gentle bicarb gtt   5.  Hypothyroidism: profound.  TSH > 110.             - does not appear to have myxedema coma             - PO levothyroxine              - AM cortisol WNL   6. Bladder cancer s/p BCG             - Foley placement for now             - doesn't look like we need to do bladder irrigation   7.  Dispo: being admitted    Subjective:    Seen in room.  Foley placed, some UOP.  On gentle bicarb gtt, no real movement with Bun/Cr.  I discussed with pt's son at bedside.  Will try to treat the treatable for 48-72 hrs.  If no sig improvement by Monday, will pursue palliative.  We are all in agreement- no dialysis.     Objective:   BP 126/60 (BP Location: Left Arm)   Pulse 60   Temp (!) 97.5 F (36.4 C) (Oral)   Resp 17   Ht 5' 7 (1.702 m)   Wt 66 kg   SpO2 97%   BMI 22.79 kg/m   Intake/Output Summary (Last 24 hours) at 12/12/2023 1037 Last data filed at 12/12/2023 0514 Gross per 24 hour  Intake 3 ml  Output 475 ml  Net -472 ml   Weight change:   Physical Exam: GEN NAD, sitting in bed HEENT HOH, bandage on R ear NECK no JVD PULM faint crackles bilateral bases CV RRR ABD soft, slightly bulging flanks GU: condom cath in place, serosanginous urine EXT 1+ ankle edema NEURO AAO x 3 no asterixis SKIN  _ multiple AK MSK no effusions  Imaging: US  RENAL Result Date: 12/11/2023 CLINICAL DATA:  Acute kidney injury EXAM: RENAL / URINARY TRACT ULTRASOUND COMPLETE COMPARISON:  None Available. FINDINGS: Right Kidney: Renal measurements: 8.8 x 4.7 x 4.4 cm = volume: 105 mL. Echogenic parenchyma and cortical thinning no mas, nephrolithiasis s or hydronephrosis visualized. Left Kidney: Renal measurements: 10.6 x 5.3 x 5.6 cm = volume: 165 mL. Echogenic parenchyma and cortical thinning. Mild fullness of the collecting system. No renal mass or nephrolithiasis. Bladder:  Layering sludge within the bladder.  Normal wall thickness. Other: Intra-abdominal ascites. Bilateral pleural effusion. Nodular liver morphology. Prostatomegaly measuring 5.4 x 4.2 x 4.3 cm (51 cc) IMPRESSION: Increased parenchymal echogenicity of the kidneys suggestive of medical renal disease. Correlate with renal function test. Mild fullness of the left collecting system. Ascites and bilateral pleural effusion. Layering sludge within the bladder lumen. Prostatomegaly. Electronically Signed   By: Megan  Zare M.D.   On: 12/11/2023 19:02   CT Head Wo Contrast Result Date: 12/11/2023 EXAM: CT HEAD WITHOUT CONTRAST 12/11/2023 02:03:26 PM TECHNIQUE: CT of the head was performed without the administration of intravenous contrast. Automated exposure control, iterative reconstruction, and/or weight based adjustment of the mA/kV was utilized to reduce the radiation dose to as low as reasonably achievable. COMPARISON: 04/18/2023 CLINICAL HISTORY: Patient with altered mental status, reported by family to have decreased oral intake and difficulty waking. FINDINGS: BRAIN AND VENTRICLES: No acute hemorrhage. Gray-white differentiation is preserved. No hydrocephalus. No extra-axial collection. No mass effect or midline shift. Nonspecific hypoattenuation in the periventricular and subcortical white matter, most likely representing chronic small vessel disease. Remote  infarct in the left coronal radiata extending into the left basal ganglia similar to prior. Mild parenchymal volume loss. ORBITS: No acute abnormality. SINUSES: Mild mucosal thickening in the ethmoid and maxillary sinuses. SOFT TISSUES AND SKULL: No acute soft tissue abnormality. No skull fracture. Atherosclerosis at the skull base involving the carotid siphons and intracranial vertebral arteries. Additional scattered atherosclerosis of multiple vessels throughout the scalp. IMPRESSION: 1. No acute intracranial abnormality. 2. 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 3. Chronic small vessel disease and mild parenchymal volume loss. Electronically signed by: Donnice Mania MD 12/11/2023 03:15 PM EDT RP Workstation: [id number]   DG Chest Portable 1 View Result Date: 12/11/2023 CLINICAL DATA:  SOB EXAM: PORTABLE CHEST - 1 VIEW COMPARISON:  April 18, 2023 FINDINGS: Bilateral perihilar interstitial opacities. Decreasing airspace opacities in the left mid lung. Small left pleural effusion with persistent left basilar atelectasis. No pneumothorax. Moderate cardiomegaly. Left chest pacemaker with leads terminating in the right atrium and right ventricle. Sternotomy wires and CABG markers. Tortuous aorta with aortic atherosclerosis. No acute fracture or destructive lesions. Multilevel thoracic osteophytosis. IMPRESSION: 1. Moderate cardiomegaly with findings of interstitial edema. 2. Decreasing size of the left pleural effusion, now small in volume, with left basilar atelectasis. Electronically Signed   By: Rogelia Myers M.D.   On: 12/11/2023 13:14    Labs: BMET Recent Labs  Lab 12/11/23 1236 12/11/23 2040 12/12/23 0033 12/12/23 0605  NA 135  --   --  137  K 6.5* 6.4* 5.5* 5.1  CL 116*  --   --  114*  CO2 11*  --   --  12*  GLUCOSE 100*  --   --  173*  BUN 114*  --   --  110*  CREATININE 4.45*  --   --  4.37*  CALCIUM  8.6*  --   --  8.5*    CBC Recent Labs  Lab 12/11/23 1236 12/12/23 0605  WBC 6.1 6.1  NEUTROABS 4.2  --   HGB 9.3* 8.8*  HCT 28.6* 27.9*  MCV 95.3 96.2  PLT 95* 110*    Medications:     apixaban   2.5 mg Oral BID   atorvastatin   20 mg Oral QHS   Chlorhexidine  Gluconate Cloth  6 each Topical Daily   feeding supplement  237 mL Oral  BID BM   levothyroxine   50 mcg Oral Q0600   sodium bicarbonate   1,300 mg Oral BID   sodium chloride  flush  3 mL Intravenous Q12H   sodium zirconium cyclosilicate   10 g Oral TID   tamsulosin   0.8 mg Oral QHS    Almarie Bonine MD 12/12/2023, 10:37 AM

## 2023-12-12 NOTE — Progress Notes (Addendum)
 PROGRESS NOTE        PATIENT DETAILS Name: [name] Age: 89 y.o. Sex: male Date of Birth: October 19, 1928 Admit Date: 12/11/2023 Admitting Physician Marsa KATHEE Scurry, MD ERE:[id number], Charlies LABOR, DO  Brief Summary: Patient is a 89 y.o.  male with a history of bladder cancer-s/p BCG infusion, chronic HFrEF, CAD s/p CABG, A-fib on Eliquis -who presented to the hospital weakness/confusion-was found to have AKI on CKD 4 with hyperkalemia and metabolic acidosis.  Significant events: 8/15>> admit to [facility].  Significant studies: 8/15>> CXR: No pneumonia.  Decreasing size of left pleural effusion. 8/15>> CT head: No acute intracranial abnormality. 8/15>> renal ultrasound: No hydronephrosis.  Significant microbiology data: 8/15>> urine culture: Pending  Procedures: None  Consults: Nephrology  Subjective: Sleeping-easily arousable-remains confused-son at bedside.  Objective: Vitals: Blood pressure 126/60, pulse 60, temperature (!) 97.5 F (36.4 C), temperature source Oral, resp. rate 17, height 5' 7 (1.702 m), weight 66 kg, SpO2 97%.   Exam: Gen Exam: Confused-but not in distress. HEENT:atraumatic, normocephalic Chest: B/L clear to auscultation anteriorly CVS:S1S2 regular Abdomen:soft non tender, non distended Extremities:no edema Neurology: Non focal-seems to move all 4 extremities. Skin: no rash  Pertinent Labs/Radiology:    Latest Ref Rng & Units 12/12/2023    6:05 AM 12/11/2023   12:36 PM 11/25/2023    3:09 PM  CBC  WBC 4.0 - 10.5 K/uL 6.1  6.1  6.4   Hemoglobin 13.0 - 17.0 g/dL 8.8  9.3  88.9   Hematocrit 39.0 - 52.0 % 27.9  28.6  33.7   Platelets 150 - 400 K/uL 110  95  112     Lab Results  Component Value Date   NA 135 12/11/2023   K 5.5 (H) 12/12/2023   CL 116 (H) 12/11/2023   CO2 11 (L) 12/11/2023      Assessment/Plan: Acute metabolic encephalopathy Multifactorial-probably likely secondary to AKI/metabolic  acidosis/hypothyroidism. Neuroimaging negative  Continue to treat underlying etiologies Delirium precautions Supportive care.  AKI on CKD 4 Felt to be multifactorial-hemodynamically mediated-secondary to p.o. intake-recently on diuretics-and with some suspicion of obstruction due to clots (history of bladder cancer) Foley catheter now in place Gently being hydrated Awaiting repeat labs Per son at bedside-family not interested in dialysis If no improvement-family okay with hospice care. Nephrology following Palliative care consulted this morning.  Hyperkalemia Secondary to worsening kidney function Continue bicarb infusion/Lokelma  Await repeat labs.  Metabolic acidosis Secondary to worsening kidney function On bicarb infusion Awaiting labs.  Severe hypothyroidism (TSH 110, free T4<0.25) Continue Synthroid  Check TSH in the next several weeks Await a.m. cortisol  Normocytic anemia Likely secondary to CKD/bladder cancer Hb trending down Follow  History of bladder cancer-s/p BCG infusion. Family not keen on pursuing any further treatments Await repeat labs-if renal function/overall condition does not improve-family okay to pursue palliative care.   Some minimal hematuria but urine and catheter tubing is clear this morning.  Complicated UTI IV Rocephin  Await urine cultures.  HTN BP stable without the use of antihypertensives.  HLD Statin  PAF Holding Coreg  Eliquis  being cautiously continued.  Chronic combined systolic/diastolic heart failure Euvolemic on exam Diuretics on hold.  History of CAD s/p CABG 1999 No anginal symptoms.  History of second-degree heart block-s/p PPM implantation Paced rhythm Telemetry monitoring  Palliative care DNR in place Discussed with son at bedside-gentle medical treatment-if no improvement-family not  interested in dialysis-they will prefer hospice set up. Palliative care consulted.   Code status:   Code Status: Limited:  Do not attempt resuscitation (DNR) -DNR-LIMITED -Do Not Intubate/DNI    DVT Prophylaxis: apixaban  (ELIQUIS ) tablet 2.5 mg Start: 12/11/23 2200 apixaban  (ELIQUIS ) tablet 2.5 mg    Family Communication: Son at bedside   Disposition Plan: Status is: Inpatient Remains inpatient appropriate because: Severity of illness   Planned Discharge Destination:Hospice care   Diet: Diet Order             Diet regular Room service appropriate? Yes; Fluid consistency: Thin  Diet effective now                     Antimicrobial agents: Anti-infectives (From admission, onward)    Start     Dose/Rate Route Frequency Ordered Stop   12/11/23 1415  cefTRIAXone  (ROCEPHIN ) 1 g in sodium chloride  0.9 % 100 mL IVPB        1 g 200 mL/hr over 30 Minutes Intravenous  Once 12/11/23 1410 12/11/23 1526   12/11/23 1400  cefTRIAXone  (ROCEPHIN ) 2 g in sodium chloride  0.9 % 100 mL IVPB  Status:  Discontinued        2 g 200 mL/hr over 30 Minutes Intravenous  Once 12/11/23 1346 12/11/23 1410        MEDICATIONS: Scheduled Meds:  apixaban   2.5 mg Oral BID   atorvastatin   20 mg Oral QHS   Chlorhexidine  Gluconate Cloth  6 each Topical Daily   feeding supplement  237 mL Oral BID BM   levothyroxine   50 mcg Oral Q0600   sodium chloride  flush  3 mL Intravenous Q12H   sodium zirconium cyclosilicate   10 g Oral TID   tamsulosin   0.8 mg Oral QHS   Continuous Infusions:  sodium bicarbonate  150 mEq in dextrose  5 % 1,150 mL infusion 75 mL/hr at 12/11/23 1931   PRN Meds:.acetaminophen  **OR** acetaminophen , polyethylene glycol   I have personally reviewed following labs and imaging studies  LABORATORY DATA: CBC: Recent Labs  Lab 12/11/23 1236 12/12/23 0605  WBC 6.1 6.1  NEUTROABS 4.2  --   HGB 9.3* 8.8*  HCT 28.6* 27.9*  MCV 95.3 96.2  PLT 95* 110*    Basic Metabolic Panel: Recent Labs  Lab 12/11/23 1236 12/11/23 2040 12/12/23 0033  NA 135  --   --   K 6.5* 6.4* 5.5*  CL 116*  --   --    CO2 11*  --   --   GLUCOSE 100*  --   --   BUN 114*  --   --   CREATININE 4.45*  --   --   CALCIUM  8.6*  --   --   MG 2.1  --   --     GFR: Estimated Creatinine Clearance: 9.7 mL/min (A) (by C-G formula based on SCr of 4.45 mg/dL (H)).  Liver Function Tests: Recent Labs  Lab 12/11/23 1236  AST 26  ALT 22  ALKPHOS 76  BILITOT 0.7  PROT 7.2  ALBUMIN  3.0*   No results for input(s): LIPASE, AMYLASE in the last 168 hours. Recent Labs  Lab 12/11/23 1236  AMMONIA <13    Coagulation Profile: No results for input(s): INR, PROTIME in the last 168 hours.  Cardiac Enzymes: Recent Labs  Lab 12/11/23 1447  CKTOTAL 80    BNP (last 3 results) Recent Labs    01/06/23 1341  PROBNP 1,570.0*    Lipid Profile: No results  for input(s): CHOL, HDL, LDLCALC, TRIG, CHOLHDL, LDLDIRECT in the last 72 hours.  Thyroid  Function Tests: Recent Labs    12/11/23 1236  TSH 110.765*  FREET4 <0.25*    Anemia Panel: No results for input(s): VITAMINB12, FOLATE, FERRITIN, TIBC, IRON, RETICCTPCT in the last 72 hours.  Urine analysis:    Component Value Date/Time   COLORURINE RED (A) 12/11/2023 1236   APPEARANCEUR CLOUDY (A) 12/11/2023 1236   LABSPEC 1.012 12/11/2023 1236   PHURINE 5.0 12/11/2023 1236   GLUCOSEU NEGATIVE 12/11/2023 1236   HGBUR MODERATE (A) 12/11/2023 1236   BILIRUBINUR NEGATIVE 12/11/2023 1236   KETONESUR NEGATIVE 12/11/2023 1236   PROTEINUR 100 (A) 12/11/2023 1236   UROBILINOGEN 0.2 10/21/2008 2146   NITRITE NEGATIVE 12/11/2023 1236   LEUKOCYTESUR LARGE (A) 12/11/2023 1236    Sepsis Labs: Lactic Acid, Venous    Component Value Date/Time   LATICACIDVEN 0.4 (L) 12/11/2023 1452    MICROBIOLOGY: No results found for this or any previous visit (from the past 240 hours).  RADIOLOGY STUDIES/RESULTS: US  RENAL Result Date: 12/11/2023 CLINICAL DATA:  Acute kidney injury EXAM: RENAL / URINARY TRACT ULTRASOUND COMPLETE COMPARISON:   None Available. FINDINGS: Right Kidney: Renal measurements: 8.8 x 4.7 x 4.4 cm = volume: 105 mL. Echogenic parenchyma and cortical thinning no mas, nephrolithiasis s or hydronephrosis visualized. Left Kidney: Renal measurements: 10.6 x 5.3 x 5.6 cm = volume: 165 mL. Echogenic parenchyma and cortical thinning. Mild fullness of the collecting system. No renal mass or nephrolithiasis. Bladder: Layering sludge within the bladder.  Normal wall thickness. Other: Intra-abdominal ascites. Bilateral pleural effusion. Nodular liver morphology. Prostatomegaly measuring 5.4 x 4.2 x 4.3 cm (51 cc) IMPRESSION: Increased parenchymal echogenicity of the kidneys suggestive of medical renal disease. Correlate with renal function test. Mild fullness of the left collecting system. Ascites and bilateral pleural effusion. Layering sludge within the bladder lumen. Prostatomegaly. Electronically Signed   By: Megan  Zare M.D.   On: 12/11/2023 19:02   CT Head Wo Contrast Result Date: 12/11/2023 EXAM: CT HEAD WITHOUT CONTRAST 12/11/2023 02:03:26 PM TECHNIQUE: CT of the head was performed without the administration of intravenous contrast. Automated exposure control, iterative reconstruction, and/or weight based adjustment of the mA/kV was utilized to reduce the radiation dose to as low as reasonably achievable. COMPARISON: 04/18/2023 CLINICAL HISTORY: Patient with altered mental status, reported by family to have decreased oral intake and difficulty waking. FINDINGS: BRAIN AND VENTRICLES: No acute hemorrhage. Gray-white differentiation is preserved. No hydrocephalus. No extra-axial collection. No mass effect or midline shift. Nonspecific hypoattenuation in the periventricular and subcortical white matter, most likely representing chronic small vessel disease. 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 Mild parenchymal volume loss. ORBITS: No acute abnormality. SINUSES: Mild mucosal  thickening in the ethmoid and maxillary sinuses. SOFT TISSUES AND SKULL: No acute soft tissue abnormality. No skull fracture. Atherosclerosis at the skull base involving the carotid siphons and intracranial vertebral arteries. Additional scattered atherosclerosis of multiple vessels throughout the scalp. IMPRESSION: 1. No acute intracranial abnormality. 2. 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 3. Chronic small vessel disease and mild parenchymal volume loss. Electronically signed by: Donnice Mania MD 12/11/2023 03:15 PM EDT RP Workstation: [id number]   DG Chest Portable 1 View Result Date: 12/11/2023 CLINICAL DATA:  SOB EXAM: PORTABLE CHEST - 1 VIEW COMPARISON:  April 18, 2023 FINDINGS: Bilateral perihilar interstitial opacities. Decreasing airspace opacities in  the left mid lung. Small left pleural effusion with persistent left basilar atelectasis. No pneumothorax. Moderate cardiomegaly. Left chest pacemaker with leads terminating in the right atrium and right ventricle. Sternotomy wires and CABG markers. Tortuous aorta with aortic atherosclerosis. No acute fracture or destructive lesions. Multilevel thoracic osteophytosis. IMPRESSION: 1. Moderate cardiomegaly with findings of interstitial edema. 2. Decreasing size of the left pleural effusion, now small in volume, with left basilar atelectasis. Electronically Signed   By: Rogelia Myers M.D.   On: 12/11/2023 13:14     LOS: 1 day   Donalda Applebaum, MD  [facility]    To contact the attending provider between 7A-7P or the covering provider during after hours 7P-7A, please log into the web site [email or web address] and access using universal [place] password for that web site. If you do not have the password, please call the hospital operator.  12/12/2023, 8:21 AM

## 2023-12-12 NOTE — Progress Notes (Signed)
 Pt noted to be very restless and anxious upon approach for assessment. Very confused and difficult to redirect. Pt given prn dilaudid , noted to be resting quietly with eyes closed upon reassessment. Pt later approached for hs meds unable to administer, as pt not fully awake. Will monitor and reattempt if pt fully awakens. Son at bedside and also agrees at this time.

## 2023-12-12 NOTE — Progress Notes (Signed)
 Patient agitated and combative. Fighting son and staff. Not redirectable. Pulling off all medical devices. Dr Raenelle informed. Ordered for Haldol  2 mg IM prn. Haldol  given

## 2023-12-12 NOTE — Plan of Care (Signed)

## 2023-12-12 NOTE — Plan of Care (Signed)
  Problem: Education: Goal: Knowledge of General Education information will improve Description: Including pain rating scale, medication(s)/side effects and non-pharmacologic comfort measures Outcome: Progressing   Problem: Health Behavior/Discharge Planning: Goal: Ability to manage health-related needs will improve Outcome: Progressing   Problem: Clinical Measurements: Goal: Ability to maintain clinical measurements within normal limits will improve Outcome: Progressing   Problem: Coping: Goal: Level of anxiety will decrease Outcome: Progressing   Problem: Elimination: Goal: Will not experience complications related to bowel motility Outcome: Progressing   Problem: Pain Managment: Goal: General experience of comfort will improve and/or be controlled Outcome: Progressing

## 2023-12-12 NOTE — Progress Notes (Signed)
 Patient pulling on foley. Disconnected the drainage bag from the foley. Patient agitated and combative. Kicking at staff and son. Not redirectable.Placed in mittens. Dr Raenelle informed. Prn medication given.

## 2023-12-13 DIAGNOSIS — I10 Essential (primary) hypertension: Secondary | ICD-10-CM | POA: Diagnosis not present

## 2023-12-13 DIAGNOSIS — Z7189 Other specified counseling: Secondary | ICD-10-CM

## 2023-12-13 DIAGNOSIS — E039 Hypothyroidism, unspecified: Secondary | ICD-10-CM | POA: Diagnosis not present

## 2023-12-13 DIAGNOSIS — N3001 Acute cystitis with hematuria: Secondary | ICD-10-CM | POA: Diagnosis not present

## 2023-12-13 DIAGNOSIS — E785 Hyperlipidemia, unspecified: Secondary | ICD-10-CM | POA: Diagnosis not present

## 2023-12-13 LAB — RETICULOCYTES
Immature Retic Fract: 11.8 % (ref 2.3–15.9)
RBC.: 2.71 MIL/uL — ABNORMAL LOW (ref 4.22–5.81)
Retic Count, Absolute: 55.3 K/uL (ref 19.0–186.0)
Retic Ct Pct: 2 % (ref 0.4–3.1)

## 2023-12-13 LAB — CBC
HCT: 26.1 % — ABNORMAL LOW (ref 39.0–52.0)
Hemoglobin: 8.4 g/dL — ABNORMAL LOW (ref 13.0–17.0)
MCH: 30.7 pg (ref 26.0–34.0)
MCHC: 32.2 g/dL (ref 30.0–36.0)
MCV: 95.3 fL (ref 80.0–100.0)
Platelets: 103 K/uL — ABNORMAL LOW (ref 150–400)
RBC: 2.74 MIL/uL — ABNORMAL LOW (ref 4.22–5.81)
RDW: 17.2 % — ABNORMAL HIGH (ref 11.5–15.5)
WBC: 6.7 K/uL (ref 4.0–10.5)
nRBC: 0 % (ref 0.0–0.2)

## 2023-12-13 LAB — RENAL FUNCTION PANEL
Albumin: 2.8 g/dL — ABNORMAL LOW (ref 3.5–5.0)
Anion gap: 13 (ref 5–15)
BUN: 108 mg/dL — ABNORMAL HIGH (ref 8–23)
CO2: 14 mmol/L — ABNORMAL LOW (ref 22–32)
Calcium: 8.3 mg/dL — ABNORMAL LOW (ref 8.9–10.3)
Chloride: 115 mmol/L — ABNORMAL HIGH (ref 98–111)
Creatinine, Ser: 4.36 mg/dL — ABNORMAL HIGH (ref 0.61–1.24)
GFR, Estimated: 12 mL/min — ABNORMAL LOW (ref >60)
Glucose, Bld: 110 mg/dL — ABNORMAL HIGH (ref 70–99)
Phosphorus: 6.2 mg/dL — ABNORMAL HIGH (ref 2.5–4.6)
Potassium: 4.8 mmol/L (ref 3.5–5.1)
Sodium: 142 mmol/L (ref 135–145)

## 2023-12-13 LAB — URINE CULTURE: Culture: 60000 — AB

## 2023-12-13 MED ORDER — HYDROMORPHONE HCL 1 MG/ML IJ SOLN: 1.0000 mg | INTRAMUSCULAR | Status: DC | PRN

## 2023-12-13 MED ORDER — HYDROMORPHONE HCL 1 MG/ML IJ SOLN
1.0000 mg | INTRAMUSCULAR | Status: DC | PRN
  Administered 2023-12-13: 1 mg via INTRAVENOUS
  Filled 2023-12-13: qty 1

## 2023-12-13 MED ORDER — DIPHENHYDRAMINE HCL 50 MG/ML IJ SOLN: 12.5000 mg | INTRAMUSCULAR | Status: DC | PRN

## 2023-12-13 MED ORDER — HALOPERIDOL LACTATE 5 MG/ML IJ SOLN
5.0000 mg | Freq: Four times a day (QID) | INTRAMUSCULAR | Status: DC | PRN
  Administered 2023-12-13: 5 mg via INTRAVENOUS
  Filled 2023-12-13: qty 1

## 2023-12-13 MED ORDER — ONDANSETRON HCL 4 MG/2ML IJ SOLN: 4.0000 mg | Freq: Four times a day (QID) | INTRAMUSCULAR | Status: DC | PRN

## 2023-12-13 MED ORDER — OLANZAPINE 5 MG PO TBDP
10.0000 mg | ORAL_TABLET | Freq: Every day | ORAL | Status: DC
  Filled 2023-12-13: qty 2

## 2023-12-13 MED ORDER — MIDAZOLAM HCL 2 MG/2ML IJ SOLN: 1.0000 mg | INTRAMUSCULAR | Status: DC | PRN

## 2023-12-13 MED ORDER — LORAZEPAM 2 MG/ML PO CONC
1.0000 mg | Freq: Four times a day (QID) | ORAL | Status: DC
  Administered 2023-12-13 – 2023-12-14 (×3): 1 mg via SUBLINGUAL
  Filled 2023-12-13 (×3): qty 1

## 2023-12-13 MED ORDER — LEVOTHYROXINE SODIUM 100 MCG/5ML IV SOLN
33.0000 ug | Freq: Every day | INTRAVENOUS | Status: DC
  Administered 2023-12-13: 33 ug via INTRAVENOUS
  Filled 2023-12-13: qty 5

## 2023-12-13 MED ORDER — HYDROMORPHONE HCL 1 MG/ML PO LIQD
2.0000 mg | Freq: Four times a day (QID) | ORAL | Status: DC
  Administered 2023-12-13 – 2023-12-14 (×3): 2 mg via ORAL
  Filled 2023-12-13 (×3): qty 2

## 2023-12-13 MED ORDER — GLYCOPYRROLATE 0.2 MG/ML IJ SOLN
0.2000 mg | INTRAMUSCULAR | Status: DC | PRN
  Administered 2023-12-13: 0.2 mg via INTRAVENOUS

## 2023-12-13 MED ORDER — GLYCOPYRROLATE 0.2 MG/ML IJ SOLN
0.2000 mg | INTRAMUSCULAR | Status: DC | PRN
  Filled 2023-12-13: qty 1

## 2023-12-13 MED ORDER — GLYCOPYRROLATE 1 MG PO TABS: 1.0000 mg | ORAL_TABLET | ORAL | Status: DC | PRN

## 2023-12-13 MED ORDER — ONDANSETRON 4 MG PO TBDP: 4.0000 mg | ORAL_TABLET | Freq: Four times a day (QID) | ORAL | Status: DC | PRN

## 2023-12-13 MED ORDER — HALOPERIDOL LACTATE 2 MG/ML PO CONC: 2.0000 mg | Freq: Four times a day (QID) | ORAL | Status: DC | PRN

## 2023-12-13 NOTE — TOC Initial Note (Signed)
 Transition of Care [facility]) - Initial/Assessment Note    Patient Details  Name: [name] MRN: [id number] Date of Birth: July 23, 1928  [facility]) CM/SW Contact:    Marval Gell, RN Phone Number: 12/13/2023, 10:42 AM  Clinical Narrative:                  Per chart review.  Patient presented from home with family for weakness.  Awaiting GOC conversation with PMT to guide for disposition. ICM following for DC planning needs.        Patient Goals and CMS Choice            Expected Discharge Plan and Services   Discharge Planning Services: CM Consult   Living arrangements for the past 2 months: Single Family Home                                      Prior Living Arrangements/Services Living arrangements for the past 2 months: Single Family Home Lives with:: Relatives                   Activities of Daily Living   ADL Screening (condition at time of admission) Independently performs ADLs?: Yes (appropriate for developmental age) Is the patient deaf or have difficulty hearing?: Yes Does the patient have difficulty seeing, even when wearing glasses/contacts?: Yes Does the patient have difficulty concentrating, remembering, or making decisions?: Yes  Permission Sought/Granted                  Emotional Assessment              Admission diagnosis:  Hyperkalemia [E87.5] Lethargy [R53.83] Hypothyroidism [E03.9] Acute cystitis with hematuria [N30.01] Hypothyroidism, unspecified type [E03.9] Patient Active Problem List   Diagnosis Date Noted   Acute renal failure superimposed on stage 3a chronic kidney disease (HCC) 12/11/2023   Hypothyroidism 12/11/2023   MOST form completion 03/06/2023   Bilateral edema of lower extremity 01/23/2023   Trigger middle finger of right hand 06/13/2020   Chronic right shoulder pain 06/13/2020   Acute pain of left shoulder 06/13/2020   Acquired thrombophilia (HCC)- Eliquis  use 11/26/2019    Cancer of anterior wall of urinary bladder (HCC) 07/18/2019   Irritable bowel syndrome with diarrhea 11/04/2017   Mobitz (type) I (Wenckebach's) atrioventricular block 06/21/2017   CHF (congestive heart failure), NYHA class I, acute on chronic, diastolic (HCC)    Coronary artery disease of bypass graft of native heart with stable angina pectoris (HCC)    Hyperlipidemia LDL goal <70    Iron deficiency anemia 02/04/2016   Erythropoietin  deficiency anemia 02/04/2016   Vitamin D  deficiency 12/27/2015   Anemia 12/27/2015   Bence Jones proteinuria 12/27/2015   CKD (chronic kidney disease) stage 4, GFR 15-29 ml/min (HCC) 12/19/2015   Macular degeneration    Gout    A-fib (HCC) 01/03/2013   Essential hypertension 08/02/2008   BIGEMINY 08/02/2008   Prediabetes 08/02/2008   PCP:  Catherine Charlies LABOR, DO Pharmacy:   [id number] - [place], [place] - [address] [place] [address] Phone: [phone] Fax: [phone]     Social Drivers of Health (SDOH) Social History: SDOH Screenings   Food Insecurity: No Food Insecurity (12/12/2023)  Housing: Low Risk  (12/12/2023)  Transportation Needs: No Transportation Needs (12/12/2023)  Utilities: Not At Risk (12/12/2023)  Alcohol  Screen: Low Risk  (10/28/2023)  Depression (PHQ2-9): Low Risk  (11/25/2023)  Financial Resource Strain: Low Risk  (10/28/2023)  Physical Activity: Insufficiently Active (10/28/2023)  Social Connections: Socially Integrated (12/12/2023)  Stress: No Stress Concern Present (10/28/2023)  Tobacco Use: Medium Risk (12/11/2023)  Health Literacy: Adequate Health Literacy (10/28/2023)   SDOH Interventions:     Readmission Risk Interventions     No data to display

## 2023-12-13 NOTE — Plan of Care (Signed)
  Problem: Education: Goal: Knowledge of General Education information will improve Description: Including pain rating scale, medication(s)/side effects and non-pharmacologic comfort measures 12/13/2023 0552 by Ann Axel HERO, RN Outcome: Not Progressing 12/13/2023 0551 by Ann Axel HERO, RN Outcome: Not Progressing   Problem: Health Behavior/Discharge Planning: Goal: Ability to manage health-related needs will improve 12/13/2023 0552 by Ann Axel HERO, RN Outcome: Not Progressing 12/13/2023 0551 by Ann Axel HERO, RN Outcome: Not Progressing   Problem: Clinical Measurements: Goal: Ability to maintain clinical measurements within normal limits will improve 12/13/2023 0552 by Ann Axel HERO, RN Outcome: Progressing 12/13/2023 0551 by Ann Axel HERO, RN Outcome: Progressing

## 2023-12-13 NOTE — Progress Notes (Signed)
 [name] [id number] [facility] Liaison Note   Received request from [place], Transitions of Care Manager, for hospice services at home after discharge. Spoke with daughter  [name] to initiate education related to hospice philosophy, services, and team approach to care. Patient/family verbalized understanding of information given. Per discussion, the plan is for discharge home by PTAR/EMS on tomorrow.   DME needs discussed. Patient has the following equipment in the home:  hospital bed and walker  Patient/family requests the following equipment for delivery: None at this time  The address has been verified and is correct in the chart. [name] [phone] is the family contact.    Please send signed and completed DNR home with patient/family. Please provide prescriptions at discharge as needed to ensure ongoing symptom management.   [facility] information and contact numbers given to Name person who received.   Above information shared with [facility] and care team.   Please call with any questions or concerns.   Thank you for the opportunity to participate in this patient's care.   Nat Babe, BSN, [facility] Liaison [phone]

## 2023-12-13 NOTE — TOC Progression Note (Signed)
 Transition of Care [facility]) - Progression Note    Patient Details  Name: [name] MRN: [id number] Date of Birth: 06-30-1928  Transition of Care [place]) CM/SW Contact  Marval Gell, RN Phone Number: 12/13/2023, 4:16 PM  Clinical Narrative:      Met with patient and family in the room . Plan will be home with [facility], per choice, in the morning.  Family will provide with hospital bed they have on hand, it will be set up by later today.  Patient will need PTAR for transport, address on file verified.                    Expected Discharge Plan and Services   Discharge Planning Services: CM Consult   Living arrangements for the past 2 months: Single Family Home                                       Social Drivers of Health (SDOH) Interventions SDOH Screenings   Food Insecurity: No Food Insecurity (12/12/2023)  Housing: Low Risk  (12/12/2023)  Transportation Needs: No Transportation Needs (12/12/2023)  Utilities: Not At Risk (12/12/2023)  Alcohol Screen: Low Risk  (10/28/2023)  Depression (PHQ2-9): Low Risk  (11/25/2023)  Financial Resource Strain: Low Risk  (10/28/2023)  Physical Activity: Insufficiently Active (10/28/2023)  Social Connections: Socially Integrated (12/12/2023)  Stress: No Stress Concern Present (10/28/2023)  Tobacco Use: Medium Risk (12/11/2023)  Health Literacy: Adequate Health Literacy (10/28/2023)    Readmission Risk Interventions     No data to display

## 2023-12-13 NOTE — Consult Note (Signed)
 Consultation Note Date: 12/13/2023   Patient Name: [name]  DOB: 1929-01-07  MRN: [id number]  Age / Sex: 89 y.o., male  PCP: Catherine Charlies LABOR, DO Referring Physician: Raenelle Donalda HERO, MD  Reason for Consultation: Establishing goals of care, Non pain symptom management, Pain control, Psychosocial/spiritual support, and Terminal Care  HPI/Patient Profile: 89 y.o. male  with past medical history of hypertension, hyperlipidemia, atrial fibrillation, CKD 3-4, CAD status post CABG, secondary AV block status post pacemaker, anemia, gout, CHF, bladder cancer was admitted on 12/11/2023 with confusion/weakness, AKI/worsening CKD/uremia/hyperkalemia, possible UTI.   Clinical Assessment and Goals of Care: I have reviewed medical records including EPIC notes, labs, any available advanced directives, and imaging. Received report from primary RN - no acute concerns.   Went to visit patient at bedside - [name]/wife, Annette/daughter, and son in law present. Patient was lying in bed - I did not attempt to wake to preserve comfort. Non-verbal gestures of pain and discomfort noted. No respiratory distress, increased work of breathing, or secretions noted.   Met with family  to discuss diagnosis, prognosis, GOC, EOL wishes, disposition, and options.  I introduced Palliative Medicine as specialized medical care for people living with serious illness. It focuses on providing relief from the symptoms and stress of a serious illness. The goal is to improve quality of life for both the patient and the family.  We discussed a brief life review of the patient as well as functional and nutritional status. Patient and his wife have been married 71 years. They have one daughter and one son. Prior to hospitalization, he was living in a private residence with his wife. He enjoyed golf and baseball.  We discussed patient's current illness  and what it means in the larger context of patient's on-going co-morbidities. Family have a clear understanding of patient's current acute medical situation.  Natural disease trajectory and expectations at EOL were discussed. I attempted to elicit values and goals of care important to the patient. The difference between aggressive medical intervention and comfort care was considered in light of the patient's goals of care.   Provided education and counseling at length on the philosophy and benefits of hospice care. Discussed that it offers a holistic approach to care in the setting of end-stage illness, and is about supporting the patient where they are allowing nature to take it's course. Discussed the hospice team includes RNs, physicians, social workers, and chaplains. They can provide personal care, support for the family, and help keep patient out of the hospital as well as assist with DME needs for home hospice. Education provided on the difference between home vs residential hospice. Family are most interested in home hospice - they can provide 24/7 supervision/assistance.   Family confirm goals for continued full comfort measures.  Visit also consisted of discussions dealing with the complex and emotionally intense issues of symptom management and palliative care in the setting of serious and potentially life-threatening illness. Will adjust comfort orders to regimen that can be provided in the home. Family understand it is recommended patient remain in house overnight to ensure regimen is effective prior to discharge home - they agree to this plan and will be ready for his discharge home tomorrow pending symptom management.  Discussed with family the importance of continued conversation with each other and the medical providers regarding overall plan of care and treatment options, ensuring decisions are within the context of the patient's values and GOCs.    Questions and concerns were  addressed.  The patient/family was encouraged to call with questions and/or concerns. PMT card was provided.   Primary Decision Maker: NEXT OF KIN - wife/[name] Alkire      SUMMARY OF RECOMMENDATIONS   Continue full comfort measures Continue DNR/DNI as previously documented - durable DNR form completed and placed in shadow chart. Copy was made and will be scanned into Vynca/ACP tab Plan for patient's discharge home with hospice pending adequate symptom management - TOC consult placed; TOC and hospice liaison notified Added orders for EOL symptom management and to reflect full comfort measures, as well as discontinued orders that were not focused on comfort Unrestricted visitation orders were placed per current [facility] EOL visitation policy  Nursing to provide frequent assessments and administer PRN medications as clinically necessary to ensure EOL comfort PMT will continue to follow and support holistically  Symptom Management Scheduled dilaudid  solution q6h Scheduled ativan  solution q6h Haldol  solution q6h PRN agitation    Code Status/Advance Care Planning: DNR  Palliative Prophylaxis:  Aspiration, Frequent Pain Assessment, and Oral Care  Additional Recommendations (Limitations, Scope, Preferences): Full Comfort Care  Psycho-social/Spiritual:  Desire for further Chaplaincy support:no Created space and opportunity for patient and family to express thoughts and feelings regarding patient's current medical situation.  Emotional support and therapeutic listening provided.  Prognosis:  < 2 weeks  Discharge Planning: Home with Hospice      Primary Diagnoses: Present on Admission:  Mobitz (type) I (Wenckebach's) atrioventricular block  Iron deficiency anemia  Hyperlipidemia LDL goal <70  Gout  Essential hypertension  Coronary artery disease of bypass graft of native heart with stable angina pectoris (HCC)  CKD (chronic kidney disease) stage 4, GFR 15-29 ml/min (HCC)  CHF  (congestive heart failure), NYHA class I, acute on chronic, diastolic (HCC)  Cancer of anterior wall of urinary bladder (HCC)  A-fib (HCC)  Hypothyroidism   I have reviewed the medical record, interviewed the patient and family, and examined the patient. The following aspects are pertinent.  Past Medical History:  Diagnosis Date   A-fib (HCC)    Acute on chronic heart failure with preserved ejection fraction (HFpEF) (HCC) 04/18/2023   Basal cell carcinoma    skin   Bigeminal rhythm    Bradycardia 06/21/2017   Cancer of anterior wall of urinary bladder (HCC) 07/18/2019   Cataract    Chronic renal insufficiency, stage 3 (moderate) (HCC) 2018   GFR 30s-40s   Coronary artery disease    post bypass   CVD (cardiovascular disease)    Diabetes mellitus without complication (HCC)    type 2 diet controlled   Diverticular disease    Easy bruising    Erythropoietin  deficiency anemia 02/04/2016   Family history of adverse reaction to anesthesia    daugther PONV   Gout    Hematuria 06/30/2019   Hernia    Hyperkalemia 05/06/2017   Hyperlipidemia    Hypertension    IBS (irritable bowel syndrome)    Iron deficiency anemia 02/04/2016   Macular degeneration    Microscopic colitis    PVD (peripheral vascular disease) (HCC)    Social History   Socioeconomic History   Marital status: Married    Spouse name: Not on file   Number of children: Not on file   Years of education: Not on file   Highest education level: Not on file  Occupational History   Occupation: retired  Tobacco Use   Smoking status: Former   Smokeless tobacco: Former    Types: Sports administrator  Quit date: 02/03/1969  Vaping Use   Vaping status: Never Used  Substance and Sexual Activity   Alcohol use: No    Alcohol/week: 0.0 standard drinks of alcohol   Drug use: No   Sexual activity: Not Currently  Other Topics Concern   Not on file  Social History Narrative   Married to [place], 2 children Curtistine and [name].   Some  college. Retired from [employer].   Drinks caffeine, uses herbal remedies, takes a daily vitamin.   Wears his seatbelt, smoke detector at home, firearms in the home.   Wears a hearing aid.   Feels safe in her relationships.   Social Drivers of Corporate investment banker Strain: Low Risk  (10/28/2023)   Overall Financial Resource Strain (CARDIA)    Difficulty of Paying Living Expenses: Not hard at all  Food Insecurity: No Food Insecurity (12/12/2023)   Hunger Vital Sign    Worried About Running Out of Food in the Last Year: Never true    Ran Out of Food in the Last Year: Never true  Transportation Needs: No Transportation Needs (12/12/2023)   PRAPARE - Administrator, Civil Service (Medical): No    Lack of Transportation (Non-Medical): No  Physical Activity: Insufficiently Active (10/28/2023)   Exercise Vital Sign    Days of Exercise per Week: 3 days    Minutes of Exercise per Session: 20 min  Stress: No Stress Concern Present (10/28/2023)   [employer] of Occupational Health - Occupational Stress Questionnaire    Feeling of Stress: Not at all  Social Connections: Socially Integrated (12/12/2023)   Social Connection and Isolation Panel    Frequency of Communication with Friends and Family: Twice a week    Frequency of Social Gatherings with Friends and Family: More than three times a week    Attends Religious Services: More than 4 times per year    Active Member of [employer] or Organizations: Yes    Attends Engineer, structural: More than 4 times per year    Marital Status: Married   Family History  Problem Relation Age of Onset   Stroke Mother    Coronary artery disease Father    Heart disease Father    Melanoma Sister    Colon cancer Neg Hx    Scheduled Meds:  OLANZapine  zydis  10 mg Oral QHS   sodium chloride  flush  3 mL Intravenous Q12H   Continuous Infusions: PRN Meds:.acetaminophen  **OR** acetaminophen , diphenhydrAMINE , glycopyrrolate  **OR**  glycopyrrolate  **OR** glycopyrrolate , haloperidol  lactate, HYDROmorphone  (DILAUDID ) injection, midazolam , ondansetron  **OR** ondansetron  (ZOFRAN ) IV Medications Prior to Admission:  Prior to Admission medications   Medication Sig Start Date End Date Taking? Authorizing Provider  allopurinol  (ZYLOPRIM ) 100 MG tablet Take 1 tablet (100 mg total) by mouth daily. 01/08/23  Yes Kuneff, Renee A, DO  amLODipine  (NORVASC ) 5 MG tablet Take 1 tablet (5 mg total) by mouth daily. 06/24/23  Yes Kuneff, Renee A, DO  atorvastatin  (LIPITOR) 20 MG tablet Take 1 tablet (20 mg total) by mouth at bedtime. 06/24/23  Yes Kuneff, Renee A, DO  carvedilol  (COREG ) 6.25 MG tablet Take 1 tablet (6.25 mg total) by mouth 2 (two) times daily with a meal. 06/24/23  Yes Kuneff, Renee A, DO  Cholecalciferol  125 MCG (5000 UT) capsule Take 5,000 Units by mouth daily.   Yes [provider]  dicyclomine  (BENTYL ) 10 MG capsule Take 1 capsule (10 mg total) by mouth every 6 (six) hours as needed. 06/24/23  Yes Kuneff, Renee A, DO  ELIQUIS  2.5 MG TABS tablet TAKE 1 TABLET BY MOUTH TWICE A DAY 06/16/23  Yes Mealor, Augustus E, MD  latanoprost  (XALATAN ) 0.005 % ophthalmic solution Place 1 drop into both eyes at bedtime. 07/14/18  Yes [provider]  Multiple Vitamins-Minerals (PRESERVISION AREDS 2) CAPS Take 1 capsule by mouth 2 (two) times daily.   Yes [provider]  Potassium Chloride  ER 20 MEQ TBCR Take 1 tablet (20 mEq total) by mouth 2 (two) times daily. 06/24/23  Yes Kuneff, Renee A, DO  tamsulosin  (FLOMAX ) 0.4 MG CAPS capsule Take 0.8 mg by mouth at bedtime.   Yes [provider]  timolol  (TIMOPTIC ) 0.5 % ophthalmic solution Place 1 drop into both eyes 2 (two) times daily.  10/06/18  Yes [provider]  furosemide  (LASIX ) 40 MG tablet Take 40 mg by mouth 2 (two) times daily.    [provider]  Lancets [facility] ULTRASOFT) lancets Use to check sugar daily as instructed. 09/25/22   Kuneff,  Renee A, DO  ONETOUCH VERIO test strip USE TO CHECK SUGAR DAILY AS INSTRUCTED. 11/02/23   Kuneff, Renee A, DO  OVER THE COUNTER MEDICATION Take 1 tablet by mouth 2 (two) times daily. MitoQ    [provider]  polyethylene glycol (MIRALAX ) 17 g packet Take 17 g by mouth daily. Patient not taking: Reported on 12/11/2023 08/18/23   Shane Steffan BROCKS, MD   Allergies  Allergen Reactions   Lisinopril  Other (See Comments)     he has difficulty controlling potassium on ace/arb- lisinopril  was dc'd in the past 2/2 to hyperkalemia documented by [facility] PCP 01/2021   Penicillins Hives    Has patient had a PCN reaction causing immediate rash, facial/tongue/throat swelling, SOB or lightheadedness with hypotension: No Has patient had a PCN reaction causing severe rash involving mucus membranes or skin necrosis: Yes Has patient had a PCN reaction that required hospitalization: No Has patient had a PCN reaction occurring within the last 10 years: No If all of the above answers are NO, then may proceed with Cephalosporin use.    Vancomycin  Other (See Comments)    Edema and myalgia   Review of Systems  Unable to perform ROS: Acuity of condition    Physical Exam Vitals and nursing note reviewed.  Constitutional:      General: He is not in acute distress.    Appearance: He is ill-appearing.  Pulmonary:     Effort: No respiratory distress.  Skin:    General: Skin is warm and dry.  Neurological:     Mental Status: He is unresponsive.     Motor: Weakness present.     Vital Signs: BP (!) 126/98 (BP Location: Left Arm)   Pulse 65   Temp 97.7 F (36.5 C) (Axillary)   Resp 16   Ht 5' 7 (1.702 m)   Wt 66 kg   SpO2 97%   BMI 22.79 kg/m  Pain Scale: PAINAD POSS *See Group Information*: S-Acceptable,Sleep, easy to arouse Pain Score: Asleep   SpO2: SpO2: 97 % O2 Device:SpO2: 97 % O2 Flow Rate: .   IO: Intake/output summary: No intake or output data in the 24 hours ending 12/13/23  1510  LBM: Last BM Date : 12/11/23 Baseline Weight: Weight: 66 kg Most recent weight: Weight: 66 kg     Palliative Assessment/Data: PPS 10%     Time In: 1510 Time Out: 1625 Time Total: 75 minutes  Signed by: Jeoffrey CHRISTELLA Sharps, NP  Please contact Palliative Medicine Team phone at [phone] for questions and concerns.  For individual provider: See Amion  *Portions of this note are a verbal dictation therefore any spelling and/or grammatical errors are due to the Dragon Medical One system interpretation.

## 2023-12-13 NOTE — Progress Notes (Signed)
 PROGRESS NOTE        PATIENT DETAILS Name: [name] Age: 89 y.o. Sex: male Date of Birth: 20-Oct-1928 Admit Date: 12/11/2023 Admitting Physician Marsa KATHEE Scurry, MD ERE:[id number], Charlies LABOR, DO  Brief Summary: Patient is a 89 y.o.  male with a history of bladder cancer-s/p BCG infusion, chronic HFrEF, CAD s/p CABG, A-fib on Eliquis -who presented to the hospital weakness/confusion-was found to have AKI on CKD 4 with hyperkalemia and metabolic acidosis.  Significant events: 8/15>> admit to [facility].  Significant studies: 8/15>> CXR: No pneumonia.  Decreasing size of left pleural effusion. 8/15>> CT head: No acute intracranial abnormality. 8/15>> renal ultrasound: No hydronephrosis.  Significant microbiology data: 8/15>> urine culture: Enterococcus faecalis-sensitivities pending  Procedures: None  Consults: Nephrology  Subjective: Agitated/delirious from yesterday afternoon-throughout nighttime.  Remains restless this morning  Objective: Vitals: Blood pressure 110/64, pulse 63, temperature (!) 97.4 F (36.3 C), temperature source Axillary, resp. rate 16, height 5' 7 (1.702 m), weight 66 kg, SpO2 97%.   Exam: Confused-restless-easily agitated. Atraumatic/normocephalic Chest: Clear to auscultation CVS: S1-S2 regular Abdomen: Soft nontender nondistended Extremities: No edema Nonfocal exam but has diffuse generalized weakness.  Pertinent Labs/Radiology:    Latest Ref Rng & Units 12/13/2023    5:49 AM 12/12/2023    6:05 AM 12/11/2023   12:36 PM  CBC  WBC 4.0 - 10.5 K/uL 6.7  6.1  6.1   Hemoglobin 13.0 - 17.0 g/dL 8.4  8.8  9.3   Hematocrit 39.0 - 52.0 % 26.1  27.9  28.6   Platelets 150 - 400 K/uL 103  110  95     Lab Results  Component Value Date   NA 142 12/13/2023   K 4.8 12/13/2023   CL 115 (H) 12/13/2023   CO2 14 (L) 12/13/2023      Assessment/Plan: Acute metabolic encephalopathy Multifactorial-probably likely secondary to  AKI/metabolic acidosis/hypothyroidism. Neuroimaging negative  Continues to be agitated/delirious Add scheduled Zyprexa  sublingually Continue as needed Haldol /Versed  See discussion below-palliative care.  AKI on CKD 4 Felt to be multifactorial-hemodynamically mediated-secondary to p.o. intake-recently on diuretics-and with some suspicion of obstruction due to clots (history of bladder cancer) Foley catheter now in place Being gently hydrated Unfortunately-no significant improvement in renal function Per my discussion with son at bedside-family not interested in dialysis Plan is to give another 24 hours-if no improvement-will likely need initiation of comfort measures. Nephrology following.  Hyperkalemia Secondary to worsening kidney function Continue bicarb infusion/Lokelma   Metabolic acidosis Secondary to worsening kidney function On bicarb infusion  Severe hypothyroidism (TSH 110, free T4<0.25) Since mentation continues to fluctuate-oral intake not reliable-switch to IV Synthroid .  A.m. cortisol stable on 8/16 If he survives this hospitalization-check TSH in 4 to 6 weeks.  Normocytic anemia Likely secondary to CKD/bladder cancer Hb trending down Follow  History of bladder cancer-s/p BCG infusion. Family not keen on pursuing any further treatments No further hematuria.  Complicated UTI IV Rocephin  Await final culture results.  HTN BP stable without the use of antihypertensives.  HLD Statin  PAF Holding Coreg  Eliquis  being cautiously continued.  Chronic combined systolic/diastolic heart failure Euvolemic on exam Diuretics on hold.  History of CAD s/p CABG 1999 No anginal symptoms.  History of second-degree heart block-s/p PPM implantation Paced rhythm Telemetry monitoring  Palliative care DNR in place Discussed with son at bedside-gentle medical treatment-if no improvement by tomorrow-family  leaning to comfort care/hospice.  No dialysis per  family. Very delirious-starting scheduled Zyprexa -continue as needed Haldol /IV Versed  Await palliative care input.  Code status:   Code Status: Limited: Do not attempt resuscitation (DNR) -DNR-LIMITED -Do Not Intubate/DNI    DVT Prophylaxis: apixaban  (ELIQUIS ) tablet 2.5 mg Start: 12/11/23 2200 apixaban  (ELIQUIS ) tablet 2.5 mg    Family Communication: Son at bedside   Disposition Plan: Status is: Inpatient Remains inpatient appropriate because: Severity of illness   Planned Discharge Destination:Hospice care   Diet: Diet Order             Diet regular Room service appropriate? Yes; Fluid consistency: Thin  Diet effective now                     Antimicrobial agents: Anti-infectives (From admission, onward)    Start     Dose/Rate Route Frequency Ordered Stop   12/12/23 1400  cefTRIAXone  (ROCEPHIN ) 1 g in sodium chloride  0.9 % 100 mL IVPB        1 g 200 mL/hr over 30 Minutes Intravenous Every 24 hours 12/12/23 0838     12/11/23 1415  cefTRIAXone  (ROCEPHIN ) 1 g in sodium chloride  0.9 % 100 mL IVPB        1 g 200 mL/hr over 30 Minutes Intravenous  Once 12/11/23 1410 12/11/23 1526   12/11/23 1400  cefTRIAXone  (ROCEPHIN ) 2 g in sodium chloride  0.9 % 100 mL IVPB  Status:  Discontinued        2 g 200 mL/hr over 30 Minutes Intravenous  Once 12/11/23 1346 12/11/23 1410        MEDICATIONS: Scheduled Meds:  apixaban   2.5 mg Oral BID   atorvastatin   20 mg Oral QHS   Chlorhexidine  Gluconate Cloth  6 each Topical Daily   feeding supplement  237 mL Oral BID BM   levothyroxine   50 mcg Oral Q0600   sodium bicarbonate   1,300 mg Oral BID   sodium chloride  flush  3 mL Intravenous Q12H   sodium zirconium cyclosilicate   10 g Oral TID   tamsulosin   0.8 mg Oral QHS   Continuous Infusions:  cefTRIAXone  (ROCEPHIN )  IV 1 g (12/12/23 1424)   PRN Meds:.acetaminophen  **OR** acetaminophen , haloperidol  lactate, HYDROmorphone  (DILAUDID ) injection, LORazepam , polyethylene  glycol   I have personally reviewed following labs and imaging studies  LABORATORY DATA: CBC: Recent Labs  Lab 12/11/23 1236 12/12/23 0605 12/13/23 0549  WBC 6.1 6.1 6.7  NEUTROABS 4.2  --   --   HGB 9.3* 8.8* 8.4*  HCT 28.6* 27.9* 26.1*  MCV 95.3 96.2 95.3  PLT 95* 110* 103*    Basic Metabolic Panel: Recent Labs  Lab 12/11/23 1236 12/11/23 2040 12/12/23 0033 12/12/23 0605 12/13/23 0549  NA 135  --   --  137 142  K 6.5* 6.4* 5.5* 5.1 4.8  CL 116*  --   --  114* 115*  CO2 11*  --   --  12* 14*  GLUCOSE 100*  --   --  173* 110*  BUN 114*  --   --  110* 108*  CREATININE 4.45*  --   --  4.37* 4.36*  CALCIUM  8.6*  --   --  8.5* 8.3*  MG 2.1  --   --   --   --   PHOS  --   --   --   --  6.2*    GFR: Estimated Creatinine Clearance: 9.9 mL/min (A) (by C-G formula based on SCr  of 4.36 mg/dL (H)).  Liver Function Tests: Recent Labs  Lab 12/11/23 1236 12/12/23 0605 12/13/23 0549  AST 26 25  --   ALT 22 20  --   ALKPHOS 76 65  --   BILITOT 0.7 0.6  --   PROT 7.2 6.8  --   ALBUMIN  3.0* 2.8* 2.8*   No results for input(s): LIPASE, AMYLASE in the last 168 hours. Recent Labs  Lab 12/11/23 1236  AMMONIA <13    Coagulation Profile: No results for input(s): INR, PROTIME in the last 168 hours.  Cardiac Enzymes: Recent Labs  Lab 12/11/23 1447  CKTOTAL 80    BNP (last 3 results) Recent Labs    01/06/23 1341  PROBNP 1,570.0*    Lipid Profile: No results for input(s): CHOL, HDL, LDLCALC, TRIG, CHOLHDL, LDLDIRECT in the last 72 hours.  Thyroid  Function Tests: Recent Labs    12/11/23 1236  TSH 110.765*  FREET4 <0.25*    Anemia Panel: Recent Labs    12/13/23 0549  RETICCTPCT 2.0    Urine analysis:    Component Value Date/Time   COLORURINE RED (A) 12/11/2023 1236   APPEARANCEUR CLOUDY (A) 12/11/2023 1236   LABSPEC 1.012 12/11/2023 1236   PHURINE 5.0 12/11/2023 1236   GLUCOSEU NEGATIVE 12/11/2023 1236   HGBUR MODERATE  (A) 12/11/2023 1236   BILIRUBINUR NEGATIVE 12/11/2023 1236   KETONESUR NEGATIVE 12/11/2023 1236   PROTEINUR 100 (A) 12/11/2023 1236   UROBILINOGEN 0.2 10/21/2008 2146   NITRITE NEGATIVE 12/11/2023 1236   LEUKOCYTESUR LARGE (A) 12/11/2023 1236    Sepsis Labs: Lactic Acid, Venous    Component Value Date/Time   LATICACIDVEN 0.4 (L) 12/11/2023 1452    MICROBIOLOGY: Recent Results (from the past 240 hours)  Urine Culture     Status: Abnormal (Preliminary result)   Collection Time: 12/11/23 12:42 PM   Specimen: In/Out Cath Urine  Result Value Ref Range Status   Specimen Description IN/OUT CATH URINE  Final   Special Requests NONE  Final   Culture (A)  Final    60,000 COLONIES/mL ENTEROCOCCUS FAECALIS SUSCEPTIBILITIES TO FOLLOW Performed at [facility], [address] [place], [place] [address]    Report Status PENDING  Incomplete    RADIOLOGY STUDIES/RESULTS: US  RENAL Result Date: 12/11/2023 CLINICAL DATA:  Acute kidney injury EXAM: RENAL / URINARY TRACT ULTRASOUND COMPLETE COMPARISON:  None Available. FINDINGS: Right Kidney: Renal measurements: 8.8 x 4.7 x 4.4 cm = volume: 105 mL. Echogenic parenchyma and cortical thinning no mas, nephrolithiasis s or hydronephrosis visualized. Left Kidney: Renal measurements: 10.6 x 5.3 x 5.6 cm = volume: 165 mL. Echogenic parenchyma and cortical thinning. Mild fullness of the collecting system. No renal mass or nephrolithiasis. Bladder: Layering sludge within the bladder.  Normal wall thickness. Other: Intra-abdominal ascites. Bilateral pleural effusion. Nodular liver morphology. Prostatomegaly measuring 5.4 x 4.2 x 4.3 cm (51 cc) IMPRESSION: Increased parenchymal echogenicity of the kidneys suggestive of medical renal disease. Correlate with renal function test. Mild fullness of the left collecting system. Ascites and bilateral pleural effusion. Layering sludge within the bladder lumen. Prostatomegaly. Electronically Signed   By: Megan  Zare  M.D.   On: 12/11/2023 19:02   CT Head Wo Contrast Result Date: 12/11/2023 EXAM: CT HEAD WITHOUT CONTRAST 12/11/2023 02:03:26 PM TECHNIQUE: CT of the head was performed without the administration of intravenous contrast. Automated exposure control, iterative reconstruction, and/or weight based adjustment of the mA/kV was utilized to reduce the radiation dose to as low as reasonably achievable.  COMPARISON: 04/18/2023 CLINICAL HISTORY: Patient with altered mental status, reported by family to have decreased oral intake and difficulty waking. FINDINGS: BRAIN AND VENTRICLES: No acute hemorrhage. Gray-white differentiation is preserved. No hydrocephalus. No extra-axial collection. No mass effect or midline shift. Nonspecific hypoattenuation in the periventricular and subcortical white matter, most likely representing chronic small vessel disease. 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 Mild parenchymal volume loss. ORBITS: No acute abnormality. SINUSES: Mild mucosal thickening in the ethmoid and maxillary sinuses. SOFT TISSUES AND SKULL: No acute soft tissue abnormality. No skull fracture. Atherosclerosis at the skull base involving the carotid siphons and intracranial vertebral arteries. Additional scattered atherosclerosis of multiple vessels throughout the scalp. IMPRESSION: 1. No acute intracranial abnormality. 2. 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 3. Chronic small vessel disease and mild parenchymal volume loss. Electronically signed by: Donnice Mania MD 12/11/2023 03:15 PM EDT RP Workstation: [id number]   DG Chest Portable 1 View Result Date: 12/11/2023 CLINICAL DATA:  SOB EXAM: PORTABLE CHEST - 1 VIEW COMPARISON:  April 18, 2023 FINDINGS: Bilateral perihilar interstitial opacities. Decreasing airspace opacities in the left mid lung. Small left pleural effusion with persistent  left basilar atelectasis. No pneumothorax. Moderate cardiomegaly. Left chest pacemaker with leads terminating in the right atrium and right ventricle. Sternotomy wires and CABG markers. Tortuous aorta with aortic atherosclerosis. No acute fracture or destructive lesions. Multilevel thoracic osteophytosis. IMPRESSION: 1. Moderate cardiomegaly with findings of interstitial edema. 2. Decreasing size of the left pleural effusion, now small in volume, with left basilar atelectasis. Electronically Signed   By: Rogelia Myers M.D.   On: 12/11/2023 13:14     LOS: 2 days   Donalda Applebaum, MD  [facility]    To contact the attending provider between 7A-7P or the covering provider during after hours 7P-7A, please log into the web site [email or web address] and access using universal  password for that web site. If you do not have the password, please call the hospital operator.  12/13/2023, 9:58 AM

## 2023-12-13 NOTE — Progress Notes (Signed)
 Called pharmacy for 1800 Hydromorphone  and Lorazepam  dose not loaded in floor Pyxis. Was informed that no was available to load medications until next shift

## 2023-12-13 NOTE — Progress Notes (Signed)
 [name] Progress Note   Assessment/ Plan:   AKI on CKD IV: rising over time.  Poor PO intake, off torsemide, history of BCG which recently finished - foley placed - RUS with possible L collecting system fullness but not a huge amount of output s/p Foley             - gentle bicarb gtt- will need to be judicious with this              - no dialysis- confirmed with son  - ready to move forward with hospice- have d/c'd all unnecessary meds, placed pall care c/s, and notified primary  - will sign off but if we are needed please let us  know how we can assist  - he has a PPM too- presumably would need to be deactivated but I did not specifically ask the family about this.    2.  Hyperkalemia             - got calcium , insulin / dextrose              - will give lokelma  and bicarb   3.  Anemia:             - hgb 9.3             - on ESA, will provide when appropriate   4.  Metabolic acidosis:             - gentle bicarb gtt   5.  Hypothyroidism: profound.  TSH > 110.             - does not appear to have myxedema coma             - PO levothyroxine              - AM cortisol WNL   6. Bladder cancer s/p BCG             - Foley placement for now             - doesn't look like we need to do bladder irrigation   7.  Dispo: being admitted    Subjective:    Seen in room.  Family at bedside.  No big improvement, pt is agitated, picking at things.  Family has decided to go home with hospice.  D/w primary, put in pall care c/s.  Have d/c'd meds on MAR right now that aren't focused on comfort.  They would like the Foley left in.    Objective:   BP (!) 126/98 (BP Location: Left Arm)   Pulse 65   Temp 97.7 F (36.5 C) (Axillary)   Resp 16   Ht 5' 7 (1.702 m)   Wt 66 kg   SpO2 97%   BMI 22.79 kg/m  No intake or output data in the 24 hours ending 12/13/23 1316  Weight change:   Physical Exam: GEN NAD, sitting in bed HEENT HOH, bandage on R ear NECK no JVD PULM  faint crackles bilateral bases CV RRR ABD soft, slightly bulging flanks GU: condom cath in place, serosanginous urine EXT 1+ ankle edema NEURO AAO x 3 no asterixis SKIN _ multiple AK MSK no effusions  Imaging: US  RENAL Result Date: 12/11/2023 CLINICAL DATA:  Acute kidney injury EXAM: RENAL / URINARY TRACT ULTRASOUND COMPLETE COMPARISON:  None Available. FINDINGS: Right Kidney: Renal measurements: 8.8 x 4.7 x 4.4 cm = volume: 105 mL. Echogenic parenchyma and cortical thinning no mas, nephrolithiasis s  or hydronephrosis visualized. Left Kidney: Renal measurements: 10.6 x 5.3 x 5.6 cm = volume: 165 mL. Echogenic parenchyma and cortical thinning. Mild fullness of the collecting system. No renal mass or nephrolithiasis. Bladder: Layering sludge within the bladder.  Normal wall thickness. Other: Intra-abdominal ascites. Bilateral pleural effusion. Nodular liver morphology. Prostatomegaly measuring 5.4 x 4.2 x 4.3 cm (51 cc) IMPRESSION: Increased parenchymal echogenicity of the kidneys suggestive of medical renal disease. Correlate with renal function test. Mild fullness of the left collecting system. Ascites and bilateral pleural effusion. Layering sludge within the bladder lumen. Prostatomegaly. Electronically Signed   By: Megan  Zare M.D.   On: 12/11/2023 19:02   CT Head Wo Contrast Result Date: 12/11/2023 EXAM: CT HEAD WITHOUT CONTRAST 12/11/2023 02:03:26 PM TECHNIQUE: CT of the head was performed without the administration of intravenous contrast. Automated exposure control, iterative reconstruction, and/or weight based adjustment of the mA/kV was utilized to reduce the radiation dose to as low as reasonably achievable. COMPARISON: 04/18/2023 CLINICAL HISTORY: Patient with altered mental status, reported by family to have decreased oral intake and difficulty waking. FINDINGS: BRAIN AND VENTRICLES: No acute hemorrhage. Gray-white differentiation is preserved. No hydrocephalus. No extra-axial collection.  No mass effect or midline shift. Nonspecific hypoattenuation in the periventricular and subcortical white matter, most likely representing chronic small vessel disease. 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 Mild parenchymal volume loss. ORBITS: No acute abnormality. SINUSES: Mild mucosal thickening in the ethmoid and maxillary sinuses. SOFT TISSUES AND SKULL: No acute soft tissue abnormality. No skull fracture. Atherosclerosis at the skull base involving the carotid siphons and intracranial vertebral arteries. Additional scattered atherosclerosis of multiple vessels throughout the scalp. IMPRESSION: 1. No acute intracranial abnormality. 2. 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 3. Chronic small vessel disease and mild parenchymal volume loss. Electronically signed by: Donnice Mania MD 12/11/2023 03:15 PM EDT RP Workstation: [id number]    Labs: BMET Recent Labs  Lab 12/11/23 1236 12/11/23 2040 12/12/23 0033 12/12/23 0605 12/13/23 0549  NA 135  --   --  137 142  K 6.5* 6.4* 5.5* 5.1 4.8  CL 116*  --   --  114* 115*  CO2 11*  --   --  12* 14*  GLUCOSE 100*  --   --  173* 110*  BUN 114*  --   --  110* 108*  CREATININE 4.45*  --   --  4.37* 4.36*  CALCIUM  8.6*  --   --  8.5* 8.3*  PHOS  --   --   --   --  6.2*   CBC Recent Labs  Lab 12/11/23 1236 12/12/23 0605 12/13/23 0549  WBC 6.1 6.1 6.7  NEUTROABS 4.2  --   --   HGB 9.3* 8.8* 8.4*  HCT 28.6* 27.9* 26.1*  MCV 95.3 96.2 95.3  PLT 95* 110* 103*    Medications:     apixaban   2.5 mg Oral BID   atorvastatin   20 mg Oral QHS   Chlorhexidine  Gluconate Cloth  6 each Topical Daily   feeding supplement  237 mL Oral BID BM   levothyroxine   33 mcg Intravenous Daily   OLANZapine  zydis  10 mg Oral QHS   sodium chloride  flush  3 mL Intravenous Q12H   tamsulosin   0.8 mg Oral QHS    Almarie Bonine MD 12/13/2023,  1:16 PM

## 2023-12-13 NOTE — Progress Notes (Signed)
 Discussed with nephrology-family has now decided to transition to full comfort measures Spoke with daughter/spouse at bedside-explained what comfort measures looks like, family wants to take him home with hospice care. He has had significant agitation issues overnight-Per nursing staff does really well with IV Dilaudid  and IV Haldol .  Will touch base with palliative care-and get their assistance for appropriate symptom control regimen that can be continued in the outpatient setting.  Spoke with case management-they will speak with family-offered hospice choices and appropriate DME will be arranged.

## 2023-12-13 NOTE — Plan of Care (Signed)
  Problem: Education: Goal: Knowledge of General Education information will improve Description: Including pain rating scale, medication(s)/side effects and non-pharmacologic comfort measures Outcome: Not Progressing   Problem: Clinical Measurements: Goal: Ability to maintain clinical measurements within normal limits will improve Outcome: Progressing   

## 2023-12-14 ENCOUNTER — Other Ambulatory Visit

## 2023-12-14 ENCOUNTER — Other Ambulatory Visit: Payer: Self-pay

## 2023-12-14 ENCOUNTER — Telehealth: Payer: Self-pay | Admitting: Cardiovascular Disease

## 2023-12-14 ENCOUNTER — Encounter: Payer: Self-pay | Admitting: Hematology & Oncology

## 2023-12-14 ENCOUNTER — Other Ambulatory Visit (HOSPITAL_COMMUNITY): Payer: Self-pay

## 2023-12-14 DIAGNOSIS — Z515 Encounter for palliative care: Secondary | ICD-10-CM

## 2023-12-14 DIAGNOSIS — I129 Hypertensive chronic kidney disease with stage 1 through stage 4 chronic kidney disease, or unspecified chronic kidney disease: Secondary | ICD-10-CM

## 2023-12-14 DIAGNOSIS — E039 Hypothyroidism, unspecified: Secondary | ICD-10-CM

## 2023-12-14 DIAGNOSIS — R410 Disorientation, unspecified: Secondary | ICD-10-CM

## 2023-12-14 DIAGNOSIS — R451 Restlessness and agitation: Secondary | ICD-10-CM

## 2023-12-14 DIAGNOSIS — N3001 Acute cystitis with hematuria: Secondary | ICD-10-CM

## 2023-12-14 DIAGNOSIS — E875 Hyperkalemia: Secondary | ICD-10-CM

## 2023-12-14 DIAGNOSIS — I5042 Chronic combined systolic (congestive) and diastolic (congestive) heart failure: Secondary | ICD-10-CM

## 2023-12-14 DIAGNOSIS — Z66 Do not resuscitate: Secondary | ICD-10-CM

## 2023-12-14 DIAGNOSIS — C679 Malignant neoplasm of bladder, unspecified: Secondary | ICD-10-CM

## 2023-12-14 DIAGNOSIS — N179 Acute kidney failure, unspecified: Secondary | ICD-10-CM

## 2023-12-14 DIAGNOSIS — E785 Hyperlipidemia, unspecified: Secondary | ICD-10-CM

## 2023-12-14 DIAGNOSIS — R0602 Shortness of breath: Secondary | ICD-10-CM

## 2023-12-14 DIAGNOSIS — R531 Weakness: Secondary | ICD-10-CM

## 2023-12-14 DIAGNOSIS — I441 Atrioventricular block, second degree: Secondary | ICD-10-CM

## 2023-12-14 DIAGNOSIS — I4891 Unspecified atrial fibrillation: Secondary | ICD-10-CM

## 2023-12-14 DIAGNOSIS — R5383 Other fatigue: Secondary | ICD-10-CM

## 2023-12-14 DIAGNOSIS — Z789 Other specified health status: Secondary | ICD-10-CM

## 2023-12-14 DIAGNOSIS — N189 Chronic kidney disease, unspecified: Secondary | ICD-10-CM

## 2023-12-14 DIAGNOSIS — N390 Urinary tract infection, site not specified: Secondary | ICD-10-CM

## 2023-12-14 DIAGNOSIS — D649 Anemia, unspecified: Secondary | ICD-10-CM

## 2023-12-14 LAB — PROLACTIN: Prolactin: 31.5 ng/mL (ref 3.6–32.0)

## 2023-12-14 LAB — T3, FREE: T3, Free: 0.9 pg/mL — ABNORMAL LOW (ref 2.0–4.4)

## 2023-12-14 MED ORDER — HYDROMORPHONE HCL 1 MG/ML PO LIQD
2.0000 mL | Freq: Four times a day (QID) | ORAL | 0 refills | Status: DC
  Filled 2023-12-14: qty 56, 7d supply, fill #0

## 2023-12-14 MED ORDER — HALOPERIDOL LACTATE 2 MG/ML PO CONC: 5.0000 mg | Freq: Four times a day (QID) | ORAL | 0 refills | Status: DC | PRN

## 2023-12-14 MED ORDER — HYDROMORPHONE HCL 1 MG/ML PO LIQD: 2.0000 mg | Freq: Four times a day (QID) | ORAL | 0 refills | Status: DC

## 2023-12-14 MED ORDER — ONDANSETRON 4 MG PO TBDP: 4.0000 mg | ORAL_TABLET | Freq: Four times a day (QID) | ORAL | 0 refills | Status: DC | PRN

## 2023-12-14 MED ORDER — GLYCOPYRROLATE 1 MG PO TABS: 1.0000 mg | ORAL_TABLET | ORAL | 0 refills | Status: DC | PRN

## 2023-12-14 MED ORDER — HALOPERIDOL LACTATE 2 MG/ML PO CONC
ORAL | 0 refills | Status: DC
  Filled 2023-12-14: qty 15, 2d supply, fill #0

## 2023-12-14 MED ORDER — LORAZEPAM 2 MG/ML PO CONC: 1.0000 mg | Freq: Four times a day (QID) | ORAL | 0 refills | Status: DC

## 2023-12-14 NOTE — Consult Note (Signed)
 Mr. Hagemann is well-known to me.  He is a very nice 89 year old white male.  I have known him for many years.  I actually go to church with him.  We have been following him along because of chronic renal insufficiency and erythropoietin  deficiency anemia along with some iron deficiency anemia.  He is followed by urology because of superficial bladder cancer.  He is followed by cardiology.  He has chronic atrial fibrillation.  He has a pacemaker in.  He has had bouts of congestive heart failure.  He was admitted on 12/11/2023 with weakness.  He had renal failure.  He also had profound hypothyroidism.  When he was admitted, his sodium was 135.  Potassium 6.5.  BUN 114 creatinine 4.45.  Calcium  8.6 with an albumin  of 3.0.  I think his TSH was 110.  His CBC showed a white cell count of 6.1.  Hemoglobin 9.3.  Platelet count 95,000.  It was felt that he was in renal failure.  He is not a candidate for dialysis.  His family felt that comfort care measures would be appropriate.  As such, Hospice has been called in.  This morning, he really is not responsive.  He has some Cheyne-Stokes respirations.  He does appear to be comfortable.  His labs yesterday show a white count 6.7.  Hemoglobin 8.4.  Platelet count 103,000.  Sodium 142.  Potassium 4.8.  BUN 108 creatinine 4.36.  Calcium  8.3 with an albumin  of 2.8.  His BNP was 2084.  Again he is comfortable.  He will be discharged today to home with hospice.  I just hate this for him.  He really has done nicely.  Again he does have a chronic renal insufficiency which now appears to be renal failure.  He has had congestive heart failure.  His last echocardiogram showed ejection fraction of 35-40%.   His vital signs show temperature of 97.7.  Pulse 65.  Blood pressure 126/98.  His lungs sound relatively clear bilaterally.  There may be some wheezes.  However he has good air movement bilaterally.  Cardiac exam somewhat slow but regular.  He has 1/6 systolic  ejection murmur.  Abdomen is soft.  Bowel sounds are decreased.  There is no fluid wave.  There is no palpable liver or spleen tip.  Extremity shows some mild edema in the lower legs.  Neurological exam shows no focal neurological deficits.  I really cannot argue with getting Hospice involved now.  I think by the fact that he has at this Cheyne-Stokes breathing pattern, I would be surprised if he made it more than 2-3 days.  I suspect that he probably is going to become uremic.  The hypothyroid is not being treated.  This will also, I think, hasten his passing.  His family is with him.  His wife is at home.  She has quite a few disabilities.  He will be going home today.  I know that our church will truly be incredibly saddened by this situation.  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  Again, I feel that comfort care is truly appropriate.  His family is agreeable with this.  I know that he has done fairly well for the years that I have seen him.  If there is anything else that we can do from our point of view, we will certainly help.    Jeralyn Crease, MD  2 Timothy 4:6-8

## 2023-12-14 NOTE — Plan of Care (Signed)
 Pt on comfort care at this time. Problem: Education: Goal: Knowledge of General Education information will improve Description: Including pain rating scale, medication(s)/side effects and non-pharmacologic comfort measures Outcome: Not Progressing   Problem: Health Behavior/Discharge Planning: Goal: Ability to manage health-related needs will improve Outcome: Not Progressing   Problem: Clinical Measurements: Goal: Ability to maintain clinical measurements within normal limits will improve Outcome: Not Progressing

## 2023-12-14 NOTE — Progress Notes (Signed)
 Daily Progress Note   Patient Name: [name]       Date: 12/14/2023 DOB: 1928/10/19  Age: 89 y.o. MRN#: [id number] Attending Physician: No att. providers found Primary Care Physician: Catherine Charlies LABOR, DO Admit Date: 12/11/2023  Reason for Consultation/Follow-up: Non pain symptom management, Pain control, Psychosocial/spiritual support, and Terminal Care  Subjective: I have reviewed medical records including EPIC notes, MAR, any available advanced directives as necessary, and labs. Received report from primary RN - no acute concerns. Per RN, patient did well overnight with current comfort medications.  Went to visit patient at bedside - one visitor present along with EMS for discharge home. Patient is unresponsive.No signs or non-verbal gestures of pain or discomfort noted. No respiratory distress, increased work of breathing, or secretions noted. He appears stable for discharge.  Plan for discharge home with hospice. Patient appears comfortable on current medication regimen.  Length of Stay: 3  Current Medications: Scheduled Meds:   HYDROmorphone  HCl  2 mg Oral Q6H   LORazepam   1 mg Sublingual Q6H   sodium chloride  flush  3 mL Intravenous Q12H    Continuous Infusions:   PRN Meds: acetaminophen  **OR** acetaminophen , diphenhydrAMINE , glycopyrrolate  **OR** glycopyrrolate  **OR** glycopyrrolate , haloperidol , HYDROmorphone  (DILAUDID ) injection, midazolam , ondansetron  **OR** ondansetron  (ZOFRAN ) IV  Physical Exam Vitals and nursing note reviewed.  Constitutional:      General: He is not in acute distress.    Appearance: He is ill-appearing.  Pulmonary:     Effort: No respiratory distress.  Skin:    General: Skin is warm and dry.  Neurological:     Mental Status: He is  unresponsive.     Motor: Weakness present.             Vital Signs: BP (!) 126/98 (BP Location: Left Arm)   Pulse 65   Temp 97.7 F (36.5 C) (Axillary)   Resp 16   Ht 5' 7 (1.702 m)   Wt 66 kg   SpO2 97%   BMI 22.79 kg/m  SpO2: SpO2: 97 % O2 Device: O2 Device: Room Air O2 Flow Rate:    Intake/output summary:  Intake/Output Summary (Last 24 hours) at 12/14/2023 0838 Last data filed at 12/13/2023 1843 Gross per 24 hour  Intake --  Output 400 ml  Net -400 ml   LBM: Last BM Date :  12/11/23 Baseline Weight: Weight: 66 kg Most recent weight: Weight: 66 kg       Palliative Assessment/Data: PPS 10%      Patient Active Problem List   Diagnosis Date Noted   Acute cystitis with hematuria 12/14/2023   Acute renal failure superimposed on stage 3a chronic kidney disease (HCC) 12/11/2023   Hypothyroidism 12/11/2023   MOST form completion 03/06/2023   Bilateral edema of lower extremity 01/23/2023   Trigger middle finger of right hand 06/13/2020   Chronic right shoulder pain 06/13/2020   Acute pain of left shoulder 06/13/2020   Acquired thrombophilia (HCC)- Eliquis  use 11/26/2019   Cancer of anterior wall of urinary bladder (HCC) 07/18/2019   Irritable bowel syndrome with diarrhea 11/04/2017   Mobitz (type) I [facility]) atrioventricular block 06/21/2017   CHF (congestive heart failure), NYHA class I, acute on chronic, diastolic (HCC)    Coronary artery disease of bypass graft of native heart with stable angina pectoris (HCC)    Hyperlipidemia LDL goal <70    Iron deficiency anemia 02/04/2016   Erythropoietin  deficiency anemia 02/04/2016   Vitamin D  deficiency 12/27/2015   Anemia 12/27/2015   Bence Jones proteinuria 12/27/2015   CKD (chronic kidney disease) stage 4, GFR 15-29 ml/min (HCC) 12/19/2015   Macular degeneration    Gout    A-fib (HCC) 01/03/2013   Essential hypertension 08/02/2008   BIGEMINY 08/02/2008   Prediabetes 08/02/2008    Palliative Care  Assessment & Plan   Patient Profile: 89 y.o. male  with past medical history of hypertension, hyperlipidemia, atrial fibrillation, CKD 3-4, CAD status post CABG, secondary AV block status post pacemaker, anemia, gout, CHF, bladder cancer was admitted on 12/11/2023 with confusion/weakness, AKI/worsening CKD/uremia/hyperkalemia, possible UTI.   Assessment: Principal Problem:   Hypothyroidism Active Problems:   Essential hypertension   A-fib (HCC)   Gout   CKD (chronic kidney disease) stage 4, GFR 15-29 ml/min (HCC)   Iron deficiency anemia   Mobitz (type) I (Wenckebach's) atrioventricular block   CHF (congestive heart failure), NYHA class I, acute on chronic, diastolic (HCC)   Coronary artery disease of bypass graft of native heart with stable angina pectoris (HCC)   Hyperlipidemia LDL goal <70   Cancer of anterior wall of urinary bladder (HCC)   Acute renal failure superimposed on stage 3a chronic kidney disease (HCC)   Acute cystitis with hematuria   Terminal care  Recommendations/Plan: Discharge home with hospice today Continue current comfort focused medication regimen  Goals of Care and Additional Recommendations: Limitations on Scope of Treatment: Full Comfort Care  Code Status:    Code Status Orders  (From admission, onward)           Start     Ordered   12/13/23 1407  Do not attempt resuscitation (DNR) - Comfort care  Continuous       Question Answer Comment  If patient has no pulse and is not breathing Do Not Attempt Resuscitation   In Pre-Arrest Conditions (Patient Is Breathing and Has a Pulse) Provide comfort measures. Relieve any mechanical airway obstruction. Avoid transfer unless required for comfort.   Consent: Discussion documented in EHR or advanced directives reviewed      12/13/23 1407           Code Status History     Date Active Date Inactive Code Status Order ID Comments User Context   12/11/2023 1524 12/13/2023 1407 Limited: Do not attempt  resuscitation (DNR) -DNR-LIMITED -Do Not Intubate/DNI  [id number]  Seena Marsa NOVAK,  MD ED   04/18/2023 2117 04/21/2023 1825 Limited: Do not attempt resuscitation (DNR) -DNR-LIMITED -Do Not Intubate/DNI  [id number]  Lonzell Emeline HERO, DO Inpatient   05/13/2022 1453 05/17/2022 1512 DNR [id number]  Laurita Cort DASEN, MD Inpatient   09/19/2021 0826 09/20/2021 1511 DNR [id number]  Fairy Frames, MD Inpatient   09/17/2021 1557 09/19/2021 0825 Full Code [id number]  Juvenal Harlene PENNER, DO Inpatient   07/28/2017 1609 07/29/2017 1421 Full Code [id number]  Kelsie Agent, MD Inpatient   06/21/2017 0347 06/22/2017 1631 Full Code [id number]  Burdette Birmingham, MD Inpatient   02/11/2012 1158 02/12/2012 1213 Full Code [id number]  Micheal Parris Muscat, RN Inpatient      Advance Directive Documentation    Flowsheet Row Most Recent Value  Type of Advance Directive Healthcare Power of Attorney, Living will, Out of facility DNR (pink MOST or yellow form)  Pre-existing out of facility DNR order (yellow form or pink MOST form) --  MOST Form in Place? --    Prognosis:  Hours - Days  Discharge Planning: Home with Hospice  Care plan was discussed with primary RN  Thank you for allowing the Palliative Medicine Team to assist in the care of this patient.     Jeoffrey HERO Sharps, NP  Please contact Palliative Medicine Team phone at [phone] for questions and concerns.   *Portions of this note are a verbal dictation therefore any spelling and/or grammatical errors are due to the Dragon [employer] system interpretation.

## 2023-12-14 NOTE — Telephone Encounter (Signed)
 Pt's daughter calling to inform office that pt is now under Hospice care and will no longer need in office visits as well as remote checks. Please advise

## 2023-12-14 NOTE — Discharge Summary (Addendum)
 PATIENT DETAILS Name: [name] Age: 89 y.o. Sex: male Date of Birth: 05-14-1928 MRN: [id number]. Admitting Physician: Marsa KATHEE Scurry, MD ERE:[id number], Charlies LABOR, DO  Admit Date: 12/11/2023 Discharge date: 12/14/2023  Recommendations for Outpatient Follow-up:  Optimize comfort care.  Admitted From:  Home  Disposition: Hospice care   Discharge Condition: poor  CODE STATUS:   Code Status: Do not attempt resuscitation (DNR) - Comfort care   Diet recommendation:  Diet Order             Diet clear liquid           Diet regular Room service appropriate? Yes; Fluid consistency: Thin  Diet effective now                    Brief Summary: Patient is a 89 y.o.  male with a history of bladder cancer-s/p BCG infusion, chronic HFrEF, CAD s/p CABG, A-fib on Eliquis -who presented to the hospital weakness/confusion-was found to have AKI on CKD 4 with hyperkalemia and metabolic acidosis.   Significant events: 8/15>> admit to [facility]. 8/17>> transition to comfort care   Significant studies: 8/15>> CXR: No pneumonia.  Decreasing size of left pleural effusion. 8/15>> CT head: No acute intracranial abnormality. 8/15>> renal ultrasound: No hydronephrosis.   Significant microbiology data: 8/15>> urine culture: Enterococcus faecalis   Procedures: None   Consults: Nephrology Palliative care Oncology    Brief Hospital Course: Acute metabolic encephalopathy Multifactorial-probably likely secondary to AKI/metabolic acidosis/hypothyroidism. Neuroimaging negative  Was very delirious/agitated-but after transition to comfort care-and once scheduled Dilaudid albertina started-much more comfortable overnight. Family plans to take him home with hospice today.  See discussion below.     AKI on CKD 4 Felt to be multifactorial-hemodynamically mediated-secondary to p.o. intake-recently on diuretics-and with some suspicion of obstruction due to clots (history of bladder cancer) Foley  catheter inserted-and plan is to discharge home with Foley catheter No response to supportive care including IV fluids.  After extensive discussion with family-they did not desire dialysis-and since no response to supportive care-he was transition to full comfort measures.  Nephrology followed closely.  Hyperkalemia Secondary to worsening kidney function Stabilized with bicarb infusion/Lokelma .   Metabolic acidosis Secondary to worsening kidney function Treated with bicarb infusion.   Severe hypothyroidism (TSH 110, free T4<0.25) Was started on Synthroid -however this has been discontinued as patient now full comfort measures.  No plans to check TSH.  Normocytic anemia Likely secondary to CKD/bladder cancer Hb trending down  History of bladder cancer-s/p BCG infusion. Family not keen on pursuing any further treatments No further hematuria.   Complicated UTI  urine cultures positive for Enterococcus No plans to continue antibiotics on discharge-comfort care.   HTN BP stable without the use of antihypertensives.   HLD No plans to resume statin.   PAF No plans to continue Coreg /Eliquis -comfort care.  Chronic combined systolic/diastolic heart failure Euvolemic on exam No plans to restart Dilaudid .   History of CAD s/p CABG 1999 No anginal symptoms.   History of second-degree heart block-s/p PPM implantation Paced rhythm   Palliative care Unfortunate 89 year old with bladder cancer-brought in for AKI and severe hypothyroidism.  No response to supportive care-continue to have worsening renal function and severe encephalopathy.  Family did not desire hemodialysis.  After extensive discussion-transitioned to full comfort measures on 8/17-family very keen on getting him home with hospice care.  Per my conversation with family on 8/17-he would prefer to pass in his own house.  Although unresponsive-and having Cheyne-Stokes  breathing pattern-suspect life expectancy is a few days  out-should be okay for a quick ride home [facility].  Palliative care/oncology/nephrology followed closely.  Discharge Diagnoses:  Principal Problem:   Hypothyroidism Active Problems:   CKD (chronic kidney disease) stage 4, GFR 15-29 ml/min (HCC)   Essential hypertension   A-fib (HCC)   Hyperlipidemia LDL goal <70   Gout   Iron deficiency anemia   Mobitz (type) I (Wenckebach's) atrioventricular block   CHF (congestive heart failure), NYHA class I, acute on chronic, diastolic (HCC)   Coronary artery disease of bypass graft of native heart with stable angina pectoris (HCC)   Cancer of anterior wall of urinary bladder (HCC)   Acute renal failure superimposed on stage 3a chronic kidney disease (HCC)   Acute cystitis with hematuria   Discharge Instructions:  Activity:  As tolerated   Discharge Instructions     Diet clear liquid   Complete by: As directed    Discharge instructions   Complete by: As directed    Follow with Primary MD  Catherine, Renee A, DO as needed  Get Medicines reviewed and adjusted:  Laboratory/radiological data: Please request your Primary MD to go over all hospital tests and procedure/radiological results at the follow up, please ask your Primary MD to get all Hospital records sent to his/her office.  In some cases, they will be blood work, cultures and biopsy results pending at the time of your discharge. Please request that your primary care M.D. follows up on these results.  Also Note the following: If you experience worsening of your admission symptoms, develop shortness of breath, life threatening emergency, suicidal or homicidal thoughts you must seek medical attention immediately by calling 911 or calling your MD immediately  if symptoms less severe.  You must read complete instructions/literature along with all the possible adverse reactions/side effects for all the Medicines you take and that have been prescribed to you. Take any new Medicines after  you have completely understood and accpet all the possible adverse reactions/side effects.   Do not drive when taking Pain medications or sleeping medications (Benzodaizepines)  Do not take more than prescribed Pain, Sleep and Anxiety Medications. It is not advisable to combine anxiety,sleep and pain medications without talking with your primary care practitioner  Special Instructions: If you have smoked or chewed Tobacco  in the last 2 yrs please stop smoking, stop any regular Alcohol  and or any Recreational drug use.  Wear Seat belts while driving.  Please note: You were cared for by a hospitalist during your hospital stay. Once you are discharged, your primary care physician will handle any further medical issues. Please note that NO REFILLS for any discharge medications will be authorized once you are discharged, as it is imperative that you return to your primary care physician (or establish a relationship with a primary care physician if you do not have one) for your post hospital discharge needs so that they can reassess your need for medications and monitor your lab values.   Increase activity slowly   Complete by: As directed    No dressing needed   Complete by: As directed       Allergies as of 12/14/2023       Reactions   Lisinopril  Other (See Comments)    he has difficulty controlling potassium on ace/arb- lisinopril  was dc'd in the past 2/2 to hyperkalemia documented by [facility] 01/2021   Penicillins Hives   Has patient had a PCN reaction  causing immediate rash, facial/tongue/throat swelling, SOB or lightheadedness with hypotension: No Has patient had a PCN reaction causing severe rash involving mucus membranes or skin necrosis: Yes Has patient had a PCN reaction that required hospitalization: No Has patient had a PCN reaction occurring within the last 10 years: No If all of the above answers are NO, then may proceed with Cephalosporin use.   Vancomycin  Other (See  Comments)   Edema and myalgia        Medication List     STOP taking these medications    allopurinol  100 MG tablet Commonly known as: ZYLOPRIM    amLODipine  5 MG tablet Commonly known as: NORVASC    atorvastatin  20 MG tablet Commonly known as: LIPITOR   carvedilol  6.25 MG tablet Commonly known as: COREG    Cholecalciferol  125 MCG (5000 UT) capsule   dicyclomine  10 MG capsule Commonly known as: BENTYL    Eliquis  2.5 MG Tabs tablet Generic drug: apixaban    furosemide  40 MG tablet Commonly known as: LASIX    latanoprost  0.005 % ophthalmic solution Commonly known as: XALATAN    onetouch ultrasoft lancets   OneTouch Verio test strip Generic drug: glucose blood   OVER THE COUNTER MEDICATION   polyethylene glycol 17 g packet Commonly known as: MiraLax    Potassium Chloride  ER 20 MEQ Tbcr   PreserVision AREDS 2 Caps   tamsulosin  0.4 MG Caps capsule Commonly known as: FLOMAX    timolol  0.5 % ophthalmic solution Commonly known as: TIMOPTIC        TAKE these medications    glycopyrrolate  1 MG tablet Commonly known as: ROBINUL  Take 1 tablet (1 mg total) by mouth every 4 (four) hours as needed (excessive secretions).   haloperidol  2 MG/ML solution Commonly known as: HALDOL  Place 2.5 mLs (5 mg total) under the tongue every 6 (six) hours as needed for agitation.   HYDROmorphone  HCl 1 MG/ML Liqd Commonly known as: DILAUDID  Take 2 mLs (2 mg total) by mouth every 6 (six) hours.   LORazepam  2 MG/ML concentrated solution Commonly known as: ATIVAN  Place 0.5 mLs (1 mg total) under the tongue every 6 (six) hours.   ondansetron  4 MG disintegrating tablet Commonly known as: ZOFRAN -ODT Take 1 tablet (4 mg total) by mouth every 6 (six) hours as needed for nausea or vomiting.               Discharge Care Instructions  (From admission, onward)           Start     Ordered   12/14/23 0000  No dressing needed        12/14/23 0716            Follow-up  Information     Kuneff, Renee A, DO. Schedule an appointment as soon as possible for a visit.   Specialty: Family Medicine Why: As needed Contact information: [address] [place] [address] [phone]                Allergies  Allergen Reactions   Lisinopril  Other (See Comments)     he has difficulty controlling potassium on ace/arb- lisinopril  was dc'd in the past 2/2 to hyperkalemia documented by [facility] 01/2021   Penicillins Hives    Has patient had a PCN reaction causing immediate rash, facial/tongue/throat swelling, SOB or lightheadedness with hypotension: No Has patient had a PCN reaction causing severe rash involving mucus membranes or skin necrosis: Yes Has patient had a PCN reaction that required hospitalization: No Has patient had a PCN reaction  occurring within the last 10 years: No If all of the above answers are NO, then may proceed with Cephalosporin use.    Vancomycin  Other (See Comments)    Edema and myalgia     Other Procedures/Studies: US  RENAL Result Date: 12/11/2023 CLINICAL DATA:  Acute kidney injury EXAM: RENAL / URINARY TRACT ULTRASOUND COMPLETE COMPARISON:  None Available. FINDINGS: Right Kidney: Renal measurements: 8.8 x 4.7 x 4.4 cm = volume: 105 mL. Echogenic parenchyma and cortical thinning no mas, nephrolithiasis s or hydronephrosis visualized. Left Kidney: Renal measurements: 10.6 x 5.3 x 5.6 cm = volume: 165 mL. Echogenic parenchyma and cortical thinning. Mild fullness of the collecting system. No renal mass or nephrolithiasis. Bladder: Layering sludge within the bladder.  Normal wall thickness. Other: Intra-abdominal ascites. Bilateral pleural effusion. Nodular liver morphology. Prostatomegaly measuring 5.4 x 4.2 x 4.3 cm (51 cc) IMPRESSION: Increased parenchymal echogenicity of the kidneys suggestive of medical renal disease. Correlate with renal function test. Mild fullness of the left collecting system. Ascites and bilateral pleural  effusion. Layering sludge within the bladder lumen. Prostatomegaly. Electronically Signed   By: Megan  Zare M.D.   On: 12/11/2023 19:02   CT Head Wo Contrast Result Date: 12/11/2023 EXAM: CT HEAD WITHOUT CONTRAST 12/11/2023 02:03:26 PM TECHNIQUE: CT of the head was performed without the administration of intravenous contrast. Automated exposure control, iterative reconstruction, and/or weight based adjustment of the mA/kV was utilized to reduce the radiation dose to as low as reasonably achievable. COMPARISON: 04/18/2023 CLINICAL HISTORY: Patient with altered mental status, reported by family to have decreased oral intake and difficulty waking. FINDINGS: BRAIN AND VENTRICLES: No acute hemorrhage. Gray-white differentiation is preserved. No hydrocephalus. No extra-axial collection. No mass effect or midline shift. Nonspecific hypoattenuation in the periventricular and subcortical white matter, most likely representing chronic small vessel disease. 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 Mild parenchymal volume loss. ORBITS: No acute abnormality. SINUSES: Mild mucosal thickening in the ethmoid and maxillary sinuses. SOFT TISSUES AND SKULL: No acute soft tissue abnormality. No skull fracture. Atherosclerosis at the skull base involving the carotid siphons and intracranial vertebral arteries. Additional scattered atherosclerosis of multiple vessels throughout the scalp. IMPRESSION: 1. No acute intracranial abnormality. 2. 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 3. Chronic small vessel disease and mild parenchymal volume loss. Electronically signed by: Donnice Mania MD 12/11/2023 03:15 PM EDT RP Workstation: [id number]   DG Chest Portable 1 View Result Date: 12/11/2023 CLINICAL DATA:  SOB EXAM: PORTABLE CHEST - 1 VIEW COMPARISON:  April 18, 2023 FINDINGS: Bilateral perihilar interstitial  opacities. Decreasing airspace opacities in the left mid lung. Small left pleural effusion with persistent left basilar atelectasis. No pneumothorax. Moderate cardiomegaly. Left chest pacemaker with leads terminating in the right atrium and right ventricle. Sternotomy wires and CABG markers. Tortuous aorta with aortic atherosclerosis. No acute fracture or destructive lesions. Multilevel thoracic osteophytosis. IMPRESSION: 1. Moderate cardiomegaly with findings of interstitial edema. 2. Decreasing size of the left pleural effusion, now small in volume, with left basilar atelectasis. Electronically Signed   By: Rogelia Myers M.D.   On: 12/11/2023 13:14     TODAY-DAY OF DISCHARGE:  Subjective:   [name] today appears comfortable  Objective:   Blood pressure (!) 126/98, pulse 65, temperature 97.7 F (36.5 C), temperature source Axillary, resp. rate 16, height 5' 7 (1.702 m), weight 66 kg, SpO2 97%.  Intake/Output Summary (  Last 24 hours) at 12/14/2023 0721 Last data filed at 12/13/2023 1843 Gross per 24 hour  Intake --  Output 400 ml  Net -400 ml   Filed Weights   12/11/23 1144  Weight: 66 kg    Exam: Comfortable-not in any distress [place].AT,PERRAL Supple Neck,No JVD, No cervical lymphadenopathy appriciated.  Symmetrical Chest wall movement, Good air movement bilaterally, CTAB RRR,No Gallops,Rubs or new Murmurs, No Parasternal Heave +ve B.Sounds, Abd Soft, Non tender, No organomegaly appriciated, No rebound -guarding or rigidity. No Cyanosis, Clubbing or edema, No new Rash or bruise   PERTINENT RADIOLOGIC STUDIES: No results found.   PERTINENT LAB RESULTS: CBC: Recent Labs    12/12/23 0605 12/13/23 0549  WBC 6.1 6.7  HGB 8.8* 8.4*  HCT 27.9* 26.1*  PLT 110* 103*   CMET CMP     Component Value Date/Time   NA 142 12/13/2023 0549   NA 139 05/29/2023 0000   NA 145 04/09/2017 0905   NA 140 03/28/2016 0853   K 4.8 12/13/2023 0549   K 4.5 04/09/2017 0905   K 4.4  03/28/2016 0853   CL 115 (H) 12/13/2023 0549   CL 105 04/09/2017 0905   CO2 14 (L) 12/13/2023 0549   CO2 27 04/09/2017 0905   CO2 19 (L) 03/28/2016 0853   GLUCOSE 110 (H) 12/13/2023 0549   GLUCOSE 184 (H) 04/09/2017 0905   BUN 108 (H) 12/13/2023 0549   BUN 62 (A) 05/29/2023 0000   BUN 33 (H) 04/09/2017 0905   BUN 34.4 (H) 03/28/2016 0853   CREATININE 4.36 (H) 12/13/2023 0549   CREATININE 4.44 (H) 11/25/2023 1509   CREATININE 2.35 (H) 01/23/2023 1430   CREATININE 1.7 (H) 03/28/2016 0853   CALCIUM  8.3 (L) 12/13/2023 0549   CALCIUM  9.2 04/09/2017 0905   CALCIUM  9.2 03/28/2016 0853   PROT 6.8 12/12/2023 0605   PROT 6.8 04/09/2017 0905   PROT 6.9 03/28/2016 0853   ALBUMIN  2.8 (L) 12/13/2023 0549   ALBUMIN  3.5 04/09/2017 0905   ALBUMIN  3.3 (L) 03/28/2016 0853   AST 25 12/12/2023 0605   AST 36 11/25/2023 1509   AST 23 03/28/2016 0853   ALT 20 12/12/2023 0605   ALT 41 11/25/2023 1509   ALT 27 04/09/2017 0905   ALT 37 03/28/2016 0853   ALKPHOS 65 12/12/2023 0605   ALKPHOS 69 04/09/2017 0905   ALKPHOS 85 03/28/2016 0853   BILITOT 0.6 12/12/2023 0605   BILITOT 0.4 11/25/2023 1509   BILITOT 0.79 03/28/2016 0853   GFR 28.45 (L) 01/06/2023 1341   EGFR 27 05/29/2023 0000   EGFR 32 (L) 09/29/2022 1305   GFRNONAA 12 (L) 12/13/2023 0549   GFRNONAA 12 (L) 11/25/2023 1509   GFRNONAA 25 (L) 01/15/2017 1031    GFR Estimated Creatinine Clearance: 9.9 mL/min (A) (by C-G formula based on SCr of 4.36 mg/dL (H)). No results for input(s): LIPASE, AMYLASE in the last 72 hours. Recent Labs    12/11/23 1447  CKTOTAL 80   Invalid input(s): POCBNP No results for input(s): DDIMER in the last 72 hours. No results for input(s): HGBA1C in the last 72 hours. No results for input(s): CHOL, HDL, LDLCALC, TRIG, CHOLHDL, LDLDIRECT in the last 72 hours. Recent Labs    12/11/23 1236  TSH 110.765*   Recent Labs    12/13/23 0549  RETICCTPCT 2.0   Coags: No results for  input(s): INR in the last 72 hours.  Invalid input(s): PT Microbiology: Recent Results (from the past 240 hours)  Urine  Culture     Status: Abnormal   Collection Time: 12/11/23 12:42 PM   Specimen: In/Out Cath Urine  Result Value Ref Range Status   Specimen Description IN/OUT CATH URINE  Final   Special Requests   Final    NONE Performed at [facility], [address], [place], [place] [address]    Culture 60,000 COLONIES/mL ENTEROCOCCUS FAECALIS (A)  Final   Report Status 12/13/2023 FINAL  Final   Organism ID, Bacteria ENTEROCOCCUS FAECALIS (A)  Final      Susceptibility   Enterococcus faecalis - MIC*    AMPICILLIN <=2 SENSITIVE Sensitive     NITROFURANTOIN  <=16 SENSITIVE Sensitive     VANCOMYCIN  2 SENSITIVE Sensitive     * 60,000 COLONIES/mL ENTEROCOCCUS FAECALIS    FURTHER DISCHARGE INSTRUCTIONS:  Get Medicines reviewed and adjusted: Please take all your medications with you for your next visit with your Primary MD  Laboratory/radiological data: Please request your Primary MD to go over all hospital tests and procedure/radiological results at the follow up, please ask your Primary MD to get all Hospital records sent to his/her office.  In some cases, they will be blood work, cultures and biopsy results pending at the time of your discharge. Please request that your primary care M.D. goes through all the records of your hospital data and follows up on these results.  Also Note the following: If you experience worsening of your admission symptoms, develop shortness of breath, life threatening emergency, suicidal or homicidal thoughts you must seek medical attention immediately by calling 911 or calling your MD immediately  if symptoms less severe.  You must read complete instructions/literature along with all the possible adverse reactions/side effects for all the Medicines you take and that have been prescribed to you. Take any new Medicines after you have  completely understood and accpet all the possible adverse reactions/side effects.   Do not drive when taking Pain medications or sleeping medications (Benzodaizepines)  Do not take more than prescribed Pain, Sleep and Anxiety Medications. It is not advisable to combine anxiety,sleep and pain medications without talking with your primary care practitioner  Special Instructions: If you have smoked or chewed Tobacco  in the last 2 yrs please stop smoking, stop any regular Alcohol  and or any Recreational drug use.  Wear Seat belts while driving.  Please note: You were cared for by a hospitalist during your hospital stay. Once you are discharged, your primary care physician will handle any further medical issues. Please note that NO REFILLS for any discharge medications will be authorized once you are discharged, as it is imperative that you return to your primary care physician (or establish a relationship with a primary care physician if you do not have one) for your post hospital discharge needs so that they can reassess your need for medications and monitor your lab values.  Total Time spent coordinating discharge including counseling, education and face to face time equals greater than 30 minutes.  SignedBETHA Donalda Applebaum 12/14/2023 7:21 AM

## 2023-12-14 NOTE — Telephone Encounter (Signed)
 Remotes canceled. Marked I in Pace[facility] and taken out of [facility].

## 2023-12-14 NOTE — TOC Transition Note (Signed)
 Transition of Care [facility]) - Discharge Note   Patient Details  Name: [name] MRN: [id number] Date of Birth: Sep 16, 1928  [facility] CM/SW Contact:  Robynn Eileen Hoose, RN Phone Number: 12/14/2023, 7:40 AM   Clinical Narrative:   Patient is being discharged home with hospice care. Spoke with [name], daughter, confirmed someone would be home to receive patient from [facility].  PTAR called for transportation. PTAR forms, DNR form, Scripts, and AVS placed in folder and put into pt chart. Floor nurse and Unit Secretary aware.    Final next level of care: Home w Hospice Care Barriers to Discharge: No Barriers Identified   Patient Goals and CMS Choice            Discharge Placement                       Discharge Plan and Services Additional resources added to the After Visit Summary for     Discharge Planning Services: CM Consult                                 Social Drivers of Health (SDOH) Interventions SDOH Screenings   Food Insecurity: No Food Insecurity (12/12/2023)  Housing: Low Risk  (12/12/2023)  Transportation Needs: No Transportation Needs (12/12/2023)  Utilities: Not At Risk (12/12/2023)  Alcohol Screen: Low Risk  (10/28/2023)  Depression (PHQ2-9): Low Risk  (11/25/2023)  Financial Resource Strain: Low Risk  (10/28/2023)  Physical Activity: Insufficiently Active (10/28/2023)  Social Connections: Socially Integrated (12/12/2023)  Stress: No Stress Concern Present (10/28/2023)  Tobacco Use: Medium Risk (12/11/2023)  Health Literacy: Adequate Health Literacy (10/28/2023)     Readmission Risk Interventions     No data to display

## 2023-12-14 NOTE — Plan of Care (Signed)
  Problem: Education: Goal: Knowledge of General Education information will improve Description: Including pain rating scale, medication(s)/side effects and non-pharmacologic comfort measures Outcome: Completed/Met   Problem: Health Behavior/Discharge Planning: Goal: Ability to manage health-related needs will improve Outcome: Completed/Met   Problem: Clinical Measurements: Goal: Ability to maintain clinical measurements within normal limits will improve Outcome: Completed/Met Goal: Will remain free from infection Outcome: Completed/Met Goal: Diagnostic test results will improve Outcome: Completed/Met Goal: Respiratory complications will improve Outcome: Completed/Met Goal: Cardiovascular complication will be avoided Outcome: Completed/Met   Problem: Activity: Goal: Risk for activity intolerance will decrease Outcome: Completed/Met   Problem: Nutrition: Goal: Adequate nutrition will be maintained Outcome: Completed/Met   Problem: Coping: Goal: Level of anxiety will decrease Outcome: Completed/Met   Problem: Elimination: Goal: Will not experience complications related to bowel motility Outcome: Completed/Met Goal: Will not experience complications related to urinary retention Outcome: Completed/Met   Problem: Pain Managment: Goal: General experience of comfort will improve and/or be controlled Outcome: Completed/Met   Problem: Safety: Goal: Ability to remain free from injury will improve Outcome: Completed/Met   Problem: Skin Integrity: Goal: Risk for impaired skin integrity will decrease Outcome: Completed/Met   Problem: Education: Goal: Knowledge of the prescribed therapeutic regimen will improve Outcome: Completed/Met   Problem: Coping: Goal: Ability to identify and develop effective coping behavior will improve Outcome: Completed/Met   Problem: Clinical Measurements: Goal: Quality of life will improve Outcome: Completed/Met   Problem:  Respiratory: Goal: Verbalizations of increased ease of respirations will increase Outcome: Completed/Met   Problem: Role Relationship: Goal: Family's ability to cope with current situation will improve Outcome: Completed/Met Goal: Ability to verbalize concerns, feelings, and thoughts to partner or family member will improve Outcome: Completed/Met   Problem: Pain Management: Goal: Satisfaction with pain management regimen will improve Outcome: Completed/Met

## 2023-12-16 ENCOUNTER — Other Ambulatory Visit: Payer: Self-pay | Admitting: Family Medicine

## 2023-12-21 ENCOUNTER — Other Ambulatory Visit: Payer: Self-pay | Admitting: Family Medicine

## 2023-12-23 ENCOUNTER — Ambulatory Visit: Admitting: Physician Assistant

## 2023-12-28 DEATH — deceased

## 2024-01-07 ENCOUNTER — Ambulatory Visit: Admitting: Podiatry

## 2024-01-28 NOTE — Progress Notes (Signed)
 Remote PPM Transmission

## 2024-11-02 ENCOUNTER — Encounter
